# Patient Record
Sex: Female | Born: 1945 | Race: Black or African American | Hispanic: No | Smoking: Never smoker
Health system: Southern US, Community
[De-identification: ages and names within clinical notes are randomized; demographics above are authoritative.]

## PROBLEM LIST (undated history)

## (undated) DIAGNOSIS — R160 Hepatomegaly, not elsewhere classified: Secondary | ICD-10-CM

## (undated) DIAGNOSIS — F32A Depression, unspecified: Secondary | ICD-10-CM

## (undated) DIAGNOSIS — M4800 Spinal stenosis, site unspecified: Secondary | ICD-10-CM

## (undated) DIAGNOSIS — R569 Unspecified convulsions: Secondary | ICD-10-CM

## (undated) DIAGNOSIS — C787 Secondary malignant neoplasm of liver and intrahepatic bile duct: Secondary | ICD-10-CM

## (undated) DIAGNOSIS — F419 Anxiety disorder, unspecified: Secondary | ICD-10-CM

## (undated) DIAGNOSIS — I10 Essential (primary) hypertension: Secondary | ICD-10-CM

## (undated) DIAGNOSIS — E785 Hyperlipidemia, unspecified: Secondary | ICD-10-CM

## (undated) DIAGNOSIS — M1990 Unspecified osteoarthritis, unspecified site: Secondary | ICD-10-CM

## (undated) DIAGNOSIS — C189 Malignant neoplasm of colon, unspecified: Secondary | ICD-10-CM

## (undated) DIAGNOSIS — I340 Nonrheumatic mitral (valve) insufficiency: Secondary | ICD-10-CM

## (undated) DIAGNOSIS — Z95828 Presence of other vascular implants and grafts: Secondary | ICD-10-CM

## (undated) DIAGNOSIS — F329 Major depressive disorder, single episode, unspecified: Secondary | ICD-10-CM

## (undated) DIAGNOSIS — IMO0002 Reserved for concepts with insufficient information to code with codable children: Secondary | ICD-10-CM

## (undated) HISTORY — PX: ABDOMINAL HYSTERECTOMY: SHX81

## (undated) HISTORY — DX: Nonrheumatic mitral (valve) insufficiency: I34.0

## (undated) HISTORY — DX: Anxiety disorder, unspecified: F41.9

## (undated) HISTORY — DX: Depression, unspecified: F32.A

## (undated) HISTORY — DX: Hepatomegaly, not elsewhere classified: R16.0

## (undated) HISTORY — DX: Essential (primary) hypertension: I10

## (undated) HISTORY — PX: BREAST BIOPSY: SHX20

## (undated) HISTORY — PX: CARPAL TUNNEL RELEASE: SHX101

## (undated) HISTORY — DX: Reserved for concepts with insufficient information to code with codable children: IMO0002

## (undated) HISTORY — DX: Hyperlipidemia, unspecified: E78.5

## (undated) HISTORY — DX: Presence of other vascular implants and grafts: Z95.828

## (undated) HISTORY — DX: Unspecified convulsions: R56.9

## (undated) HISTORY — PX: BACK SURGERY: SHX140

## (undated) HISTORY — DX: Unspecified osteoarthritis, unspecified site: M19.90

## (undated) HISTORY — DX: Major depressive disorder, single episode, unspecified: F32.9

## (undated) HISTORY — PX: EYE SURGERY: SHX253

---

## 2008-06-01 ENCOUNTER — Emergency Department (HOSPITAL_COMMUNITY): Admission: RE | Admit: 2008-06-01 | Discharge: 2008-06-01 | Payer: Self-pay | Admitting: Emergency Medicine

## 2008-06-01 IMAGING — CR DG CHEST 1V PORT
1 series · 1 of 1 positions shown · non-contrast
Comparison: None

CLINICAL DATA: Chest pain, confusion.

PORTABLE CHEST - 1 VIEW

[view not recorded]
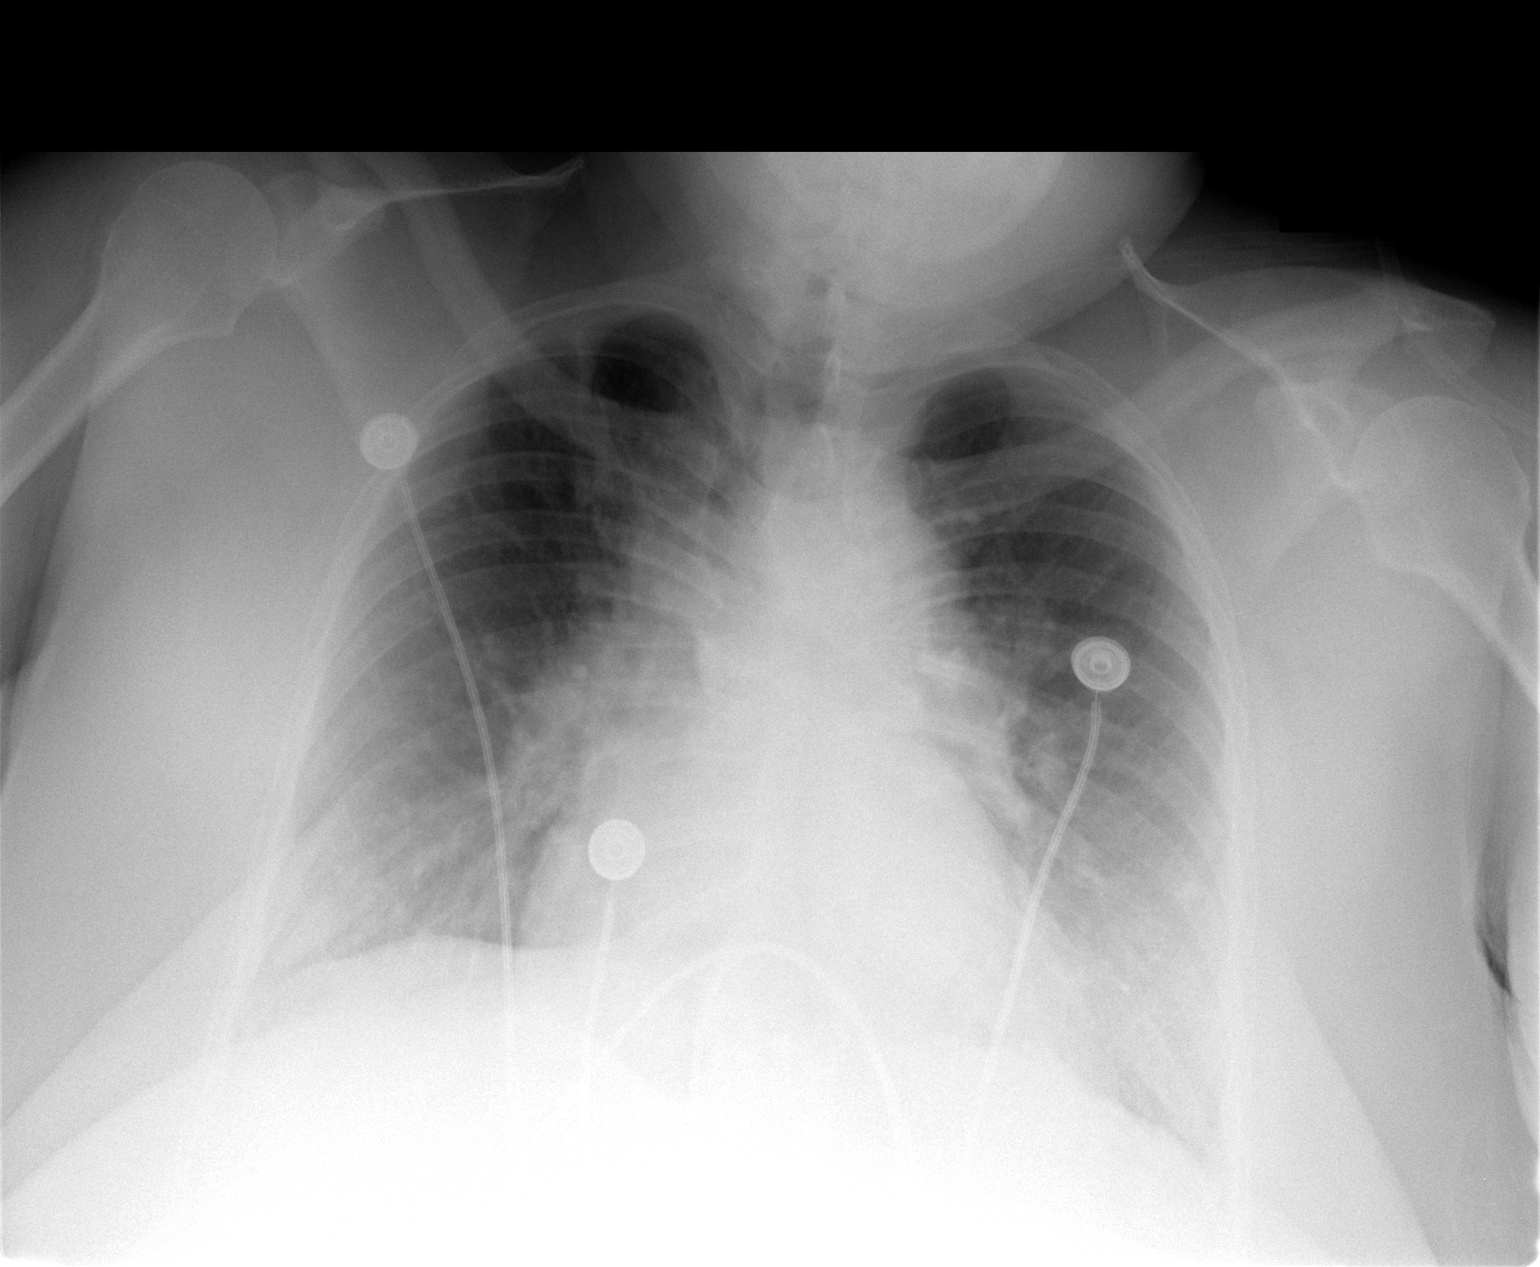

[1 of 1 positions shown; findings below may reference images not displayed]

FINDINGS: There is cardiomegaly with mild vascular congestion.  No
focal airspace opacities or effusions.
IMPRESSION: Cardiomegaly, vascular congestion.

## 2009-10-05 HISTORY — PX: BREAST BIOPSY: SHX20

## 2011-01-01 ENCOUNTER — Emergency Department (HOSPITAL_COMMUNITY)
Admission: EM | Admit: 2011-01-01 | Discharge: 2011-01-01 | Disposition: A | Discharge status: Home / Self Care | Payer: Medicare Other | Attending: Emergency Medicine | Admitting: Emergency Medicine

## 2011-01-01 DIAGNOSIS — I10 Essential (primary) hypertension: Secondary | ICD-10-CM | POA: Insufficient documentation

## 2011-01-01 DIAGNOSIS — H00039 Abscess of eyelid unspecified eye, unspecified eyelid: Secondary | ICD-10-CM | POA: Insufficient documentation

## 2011-01-01 DIAGNOSIS — H5789 Other specified disorders of eye and adnexa: Secondary | ICD-10-CM | POA: Insufficient documentation

## 2011-01-01 DIAGNOSIS — R3915 Urgency of urination: Secondary | ICD-10-CM | POA: Insufficient documentation

## 2011-01-05 ENCOUNTER — Emergency Department (HOSPITAL_COMMUNITY)
Admission: EM | Admit: 2011-01-05 | Discharge: 2011-01-05 | Disposition: A | Discharge status: Home / Self Care | Payer: Medicare Other | Attending: Emergency Medicine | Admitting: Emergency Medicine

## 2011-01-05 DIAGNOSIS — H5710 Ocular pain, unspecified eye: Secondary | ICD-10-CM | POA: Insufficient documentation

## 2011-01-05 DIAGNOSIS — I10 Essential (primary) hypertension: Secondary | ICD-10-CM | POA: Insufficient documentation

## 2011-01-05 DIAGNOSIS — H00019 Hordeolum externum unspecified eye, unspecified eyelid: Secondary | ICD-10-CM | POA: Insufficient documentation

## 2011-01-05 LAB — CBC
HCT: 36.2 % (ref 36.0–46.0)
Hemoglobin: 11.1 g/dL — ABNORMAL LOW (ref 12.0–15.0)
MCH: 27.5 pg (ref 26.0–34.0)
MCHC: 30.7 g/dL (ref 30.0–36.0)
MCV: 89.6 fL (ref 78.0–100.0)
Platelets: 217 10*3/uL (ref 150–400)
RBC: 4.04 MIL/uL (ref 3.87–5.11)
RDW: 13.9 % (ref 11.5–15.5)
WBC: 5.4 10*3/uL (ref 4.0–10.5)

## 2011-01-05 LAB — DIFFERENTIAL
Basophils Absolute: 0 10*3/uL (ref 0.0–0.1)
Basophils Relative: 1 % (ref 0–1)
Eosinophils Absolute: 0.1 10*3/uL (ref 0.0–0.7)
Eosinophils Relative: 2 % (ref 0–5)
Lymphocytes Relative: 49 % — ABNORMAL HIGH (ref 12–46)
Lymphs Abs: 2.6 10*3/uL (ref 0.7–4.0)
Monocytes Absolute: 0.5 10*3/uL (ref 0.1–1.0)
Monocytes Relative: 10 % (ref 3–12)
Neutro Abs: 2.1 10*3/uL (ref 1.7–7.7)
Neutrophils Relative %: 39 % — ABNORMAL LOW (ref 43–77)

## 2011-01-05 LAB — POCT I-STAT, CHEM 8
BUN: 20 mg/dL (ref 6–23)
Calcium, Ion: 1.15 mmol/L (ref 1.12–1.32)
Chloride: 102 mEq/L (ref 96–112)
Creatinine, Ser: 1.1 mg/dL (ref 0.4–1.2)
Glucose, Bld: 88 mg/dL (ref 70–99)
HCT: 36 % (ref 36.0–46.0)
Hemoglobin: 12.2 g/dL (ref 12.0–15.0)
Potassium: 3.9 mEq/L (ref 3.5–5.1)
Sodium: 140 mEq/L (ref 135–145)
TCO2: 28 mmol/L (ref 0–100)

## 2011-01-15 ENCOUNTER — Emergency Department (HOSPITAL_COMMUNITY)
Admission: EM | Admit: 2011-01-15 | Discharge: 2011-01-15 | Disposition: A | Discharge status: Home / Self Care | Payer: Medicare Other | Attending: Emergency Medicine | Admitting: Emergency Medicine

## 2011-01-15 DIAGNOSIS — M542 Cervicalgia: Secondary | ICD-10-CM | POA: Insufficient documentation

## 2011-01-15 DIAGNOSIS — I10 Essential (primary) hypertension: Secondary | ICD-10-CM | POA: Insufficient documentation

## 2011-01-15 DIAGNOSIS — M25519 Pain in unspecified shoulder: Secondary | ICD-10-CM | POA: Insufficient documentation

## 2011-01-22 ENCOUNTER — Emergency Department (HOSPITAL_COMMUNITY)
Admission: EM | Admit: 2011-01-22 | Discharge: 2011-01-22 | Disposition: A | Discharge status: Home / Self Care | Payer: Medicare Other | Attending: Emergency Medicine | Admitting: Emergency Medicine

## 2011-01-22 DIAGNOSIS — M4800 Spinal stenosis, site unspecified: Secondary | ICD-10-CM | POA: Insufficient documentation

## 2011-01-22 DIAGNOSIS — I10 Essential (primary) hypertension: Secondary | ICD-10-CM | POA: Insufficient documentation

## 2011-01-22 DIAGNOSIS — G8929 Other chronic pain: Secondary | ICD-10-CM | POA: Insufficient documentation

## 2011-01-22 DIAGNOSIS — M542 Cervicalgia: Secondary | ICD-10-CM | POA: Insufficient documentation

## 2011-02-06 ENCOUNTER — Ambulatory Visit (INDEPENDENT_AMBULATORY_CARE_PROVIDER_SITE_OTHER): Payer: Medicare Other | Admitting: Family Medicine

## 2011-02-06 ENCOUNTER — Encounter: Payer: Self-pay | Admitting: Family Medicine

## 2011-02-06 DIAGNOSIS — M4807 Spinal stenosis, lumbosacral region: Secondary | ICD-10-CM | POA: Insufficient documentation

## 2011-02-06 DIAGNOSIS — Z9889 Other specified postprocedural states: Secondary | ICD-10-CM

## 2011-02-06 DIAGNOSIS — M4800 Spinal stenosis, site unspecified: Secondary | ICD-10-CM

## 2011-02-06 DIAGNOSIS — E785 Hyperlipidemia, unspecified: Secondary | ICD-10-CM

## 2011-02-06 DIAGNOSIS — I10 Essential (primary) hypertension: Secondary | ICD-10-CM | POA: Insufficient documentation

## 2011-02-06 MED ORDER — HYDROCODONE-ACETAMINOPHEN 5-500 MG PO TABS: 1.0000 | ORAL_TABLET | ORAL | Status: DC | PRN

## 2011-02-06 NOTE — Progress Notes (Signed)
  Subjective:    Patient ID: Chelsea [name], female    DOB: 1946-02-14, 65 y.o.   MRN: [id number]  HPI Here today to establish care.  Previous MD- Priscille Kluver [phone], fax [phone])  Breast bx- pt had bx's done on both breasts (R side 6 months ago) and is to have repeat mammo now per pt report (no records available).  Spinal stenosis- has been seen at [facility] by neurology and neurosurgeon.  Was scheduled to see an orthopedic surgeon but this never happened b/c of the recent move from [place].  Having increased pain on R side.  Taking muscle relaxers and pain meds.  Has done PT.  Refusing surgery, injxns.  Hyperlipidemia- chronic problem for pt, taking Simvastatin.  No abd pain, N/V, myalgia  HTN- chronic problem for pt.  Fairly well controlled on Maxzide.  Denies CP, SOB, HAs, visual changes, edema.   Review of Systems For ROS see HPI     Objective:   Physical Exam  Constitutional: She is oriented to person, place, and time. She appears well-developed and well-nourished. No distress.       Very difficult to understand pt's speech  HENT:  Head: Normocephalic and atraumatic.  Eyes: Conjunctivae and EOM are normal. Pupils are equal, round, and reactive to light.  Neck: Neck supple.       Limited ROM due to pain  Cardiovascular: Normal rate, regular rhythm, normal heart sounds and intact distal pulses.   Pulmonary/Chest: Effort normal and breath sounds normal. No respiratory distress. She has no wheezes.  Abdominal: Soft. Bowel sounds are normal. She exhibits no distension. There is no tenderness.  Lymphadenopathy:    She has no cervical adenopathy.  Neurological: She is alert and oriented to person, place, and time. No cranial nerve deficit.  Skin: Skin is warm and dry.          Assessment & Plan:

## 2011-02-06 NOTE — Patient Instructions (Signed)
Follow up in 3 months to recheck cholesterol and blood pressure We'll call you with your mammogram and orthopedic appt Take the Hydrocodone as needed for pain relief Use the muscle relaxers as needed for pain relief Call if you need any refills Call with any questions or concerns Welcome!  We're glad to have you!

## 2011-02-17 NOTE — Assessment & Plan Note (Signed)
Given that pt reports she is to have f/u mammogram at this time will refer to breast center.

## 2011-02-17 NOTE — Assessment & Plan Note (Signed)
BP adequately controlled today.  Asymptomatic.  No changes at this time.

## 2011-02-17 NOTE — Assessment & Plan Note (Signed)
On statin and tolerating this w/out difficulty.  Due for repeat blood work in ~3 months per pt report.  Will return at that time for fasting labs and adjust meds prn.

## 2011-02-17 NOTE — Assessment & Plan Note (Signed)
This is pt's biggest issue currently.  From pt's report it sounds as if the work up has been very extensive.  Pt reports she is not interested in further injxns or surgery.  Open to idea of PT.  Was to see ortho prior to move for them to determine best course of PT- will refer locally.  Will maintain pt on meds at this time.

## 2011-03-05 ENCOUNTER — Telehealth: Payer: Self-pay

## 2011-03-05 ENCOUNTER — Other Ambulatory Visit: Payer: Self-pay | Admitting: *Deleted

## 2011-03-05 MED ORDER — HYDROCODONE-ACETAMINOPHEN 5-500 MG PO TABS: 1.0000 | ORAL_TABLET | ORAL | Status: DC | PRN

## 2011-03-05 NOTE — Telephone Encounter (Signed)
Call from pharmacy and they stated Hydrocodone 5-500 is on back order until August.... Per Dr.Tabori it is ok to change Rx to 7.5-500 1 po q6h prn #60 no refills... Pharmacy voiced understanding    KP

## 2011-03-05 NOTE — Telephone Encounter (Signed)
Faxed.   KP 

## 2011-03-05 NOTE — Telephone Encounter (Signed)
Ok for #60, w/ 1 refill

## 2011-03-19 ENCOUNTER — Ambulatory Visit
Admission: RE | Admit: 2011-03-19 | Discharge: 2011-03-19 | Disposition: A | Discharge status: Home / Self Care | Payer: Medicare Other | Source: Ambulatory Visit | Attending: Family Medicine | Admitting: Family Medicine

## 2011-03-19 DIAGNOSIS — Z9889 Other specified postprocedural states: Secondary | ICD-10-CM

## 2011-03-25 ENCOUNTER — Other Ambulatory Visit: Payer: Self-pay | Admitting: Family Medicine

## 2011-03-25 DIAGNOSIS — Z9889 Other specified postprocedural states: Secondary | ICD-10-CM

## 2011-03-25 DIAGNOSIS — C50919 Malignant neoplasm of unspecified site of unspecified female breast: Secondary | ICD-10-CM

## 2011-03-31 ENCOUNTER — Telehealth: Payer: Self-pay | Admitting: *Deleted

## 2011-03-31 NOTE — Telephone Encounter (Signed)
I called the pt and notified her that we still have not received her records. We have faxed [facility] on 3 separate occasions with no response. Pt notes that she will call them.

## 2011-04-02 ENCOUNTER — Ambulatory Visit
Admission: RE | Admit: 2011-04-02 | Discharge: 2011-04-02 | Disposition: A | Discharge status: Home / Self Care | Payer: Medicare Other | Source: Ambulatory Visit | Attending: Family Medicine | Admitting: Family Medicine

## 2011-04-02 DIAGNOSIS — C50919 Malignant neoplasm of unspecified site of unspecified female breast: Secondary | ICD-10-CM

## 2011-04-02 IMAGING — MG MM DIGITAL DIAGNOSTIC BILAT {BCG}
4 series · 4 of 4 positions shown · non-contrast
Comparison: Outside mammograms from [REDACTED]
[DATE], [DATE], [DATE], [DATE], and

CLINICAL DATA: Due for annual examination.  The patient just moved
to [HOSPITAL] from [place] and brought her prior mammograms,
mammogram reports, and breast biopsy reports.  Prior biopsy reports
were reviewed.  Pathology results for both breast biopsies were
benign.  The patient is asymptomatic.

DIGITAL DIAGNOSTIC BILATERAL MAMMOGRAM WITH CAD

[R CC]
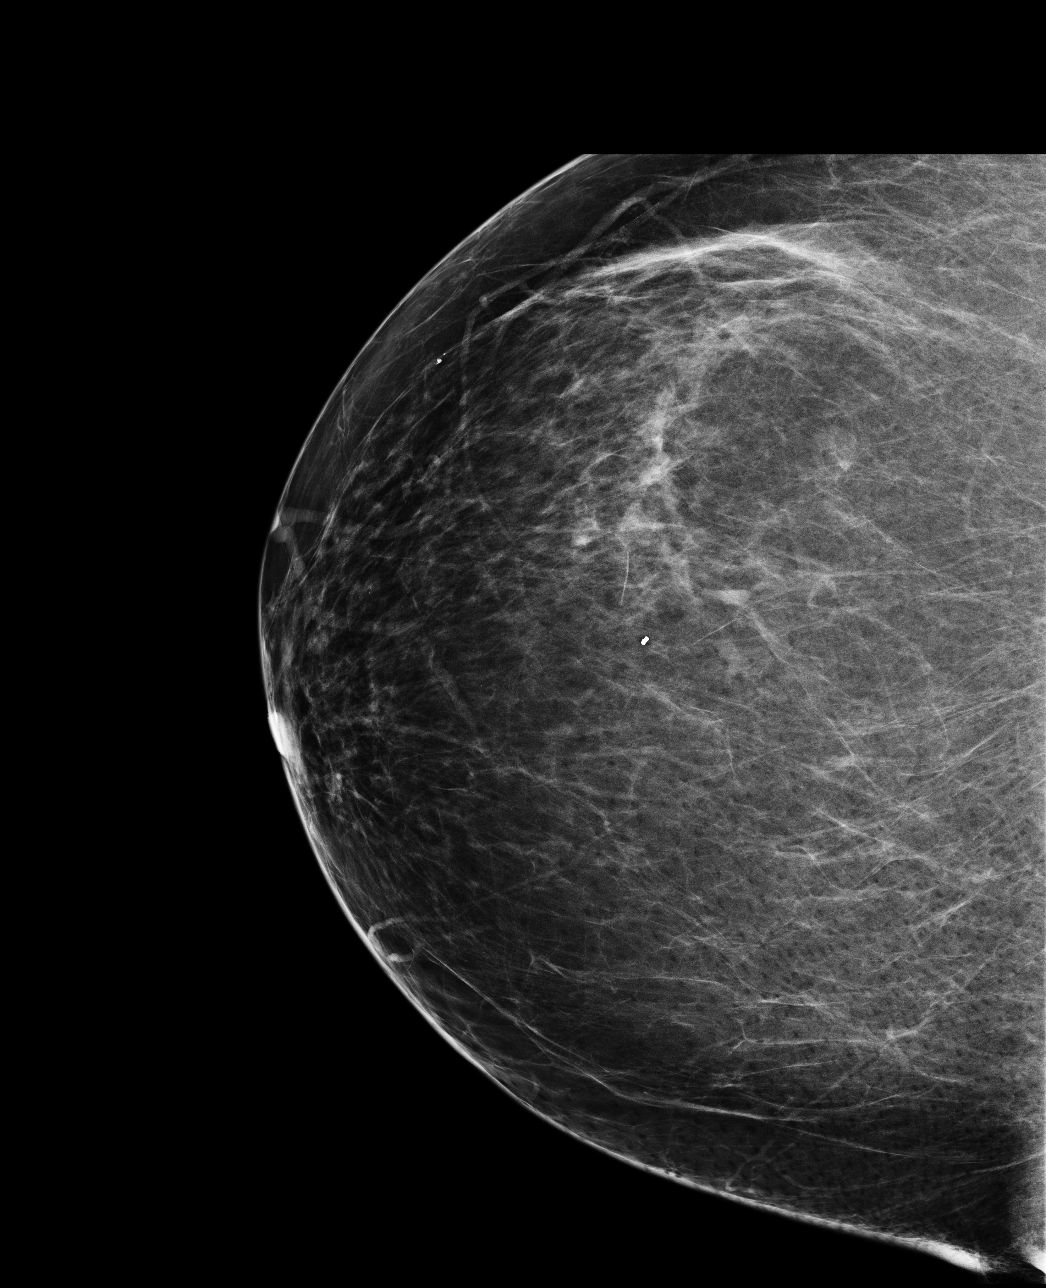

[L CC]
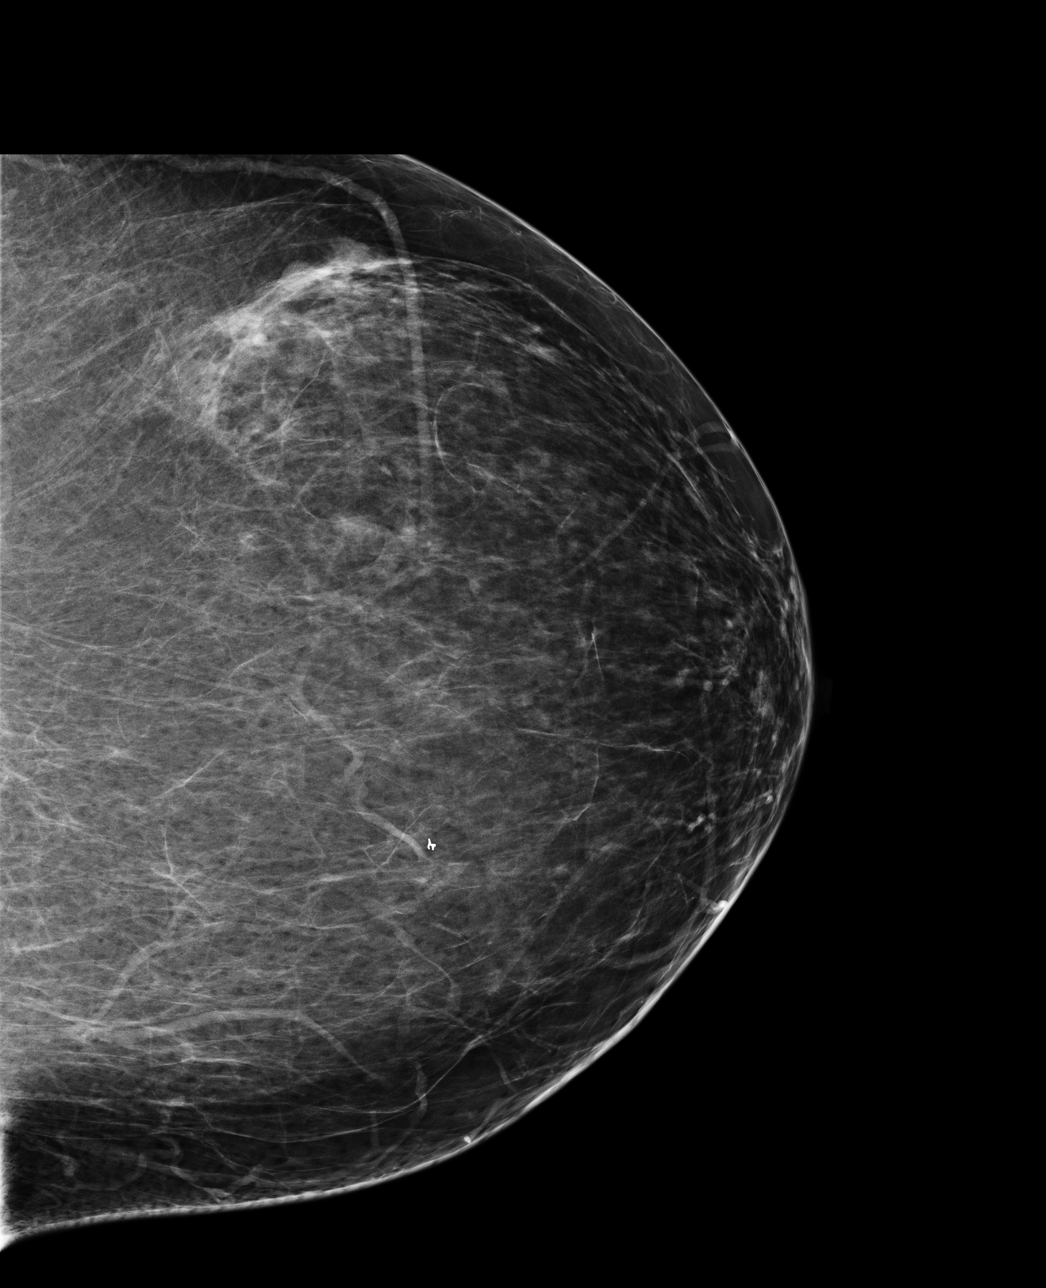

[L MLO]
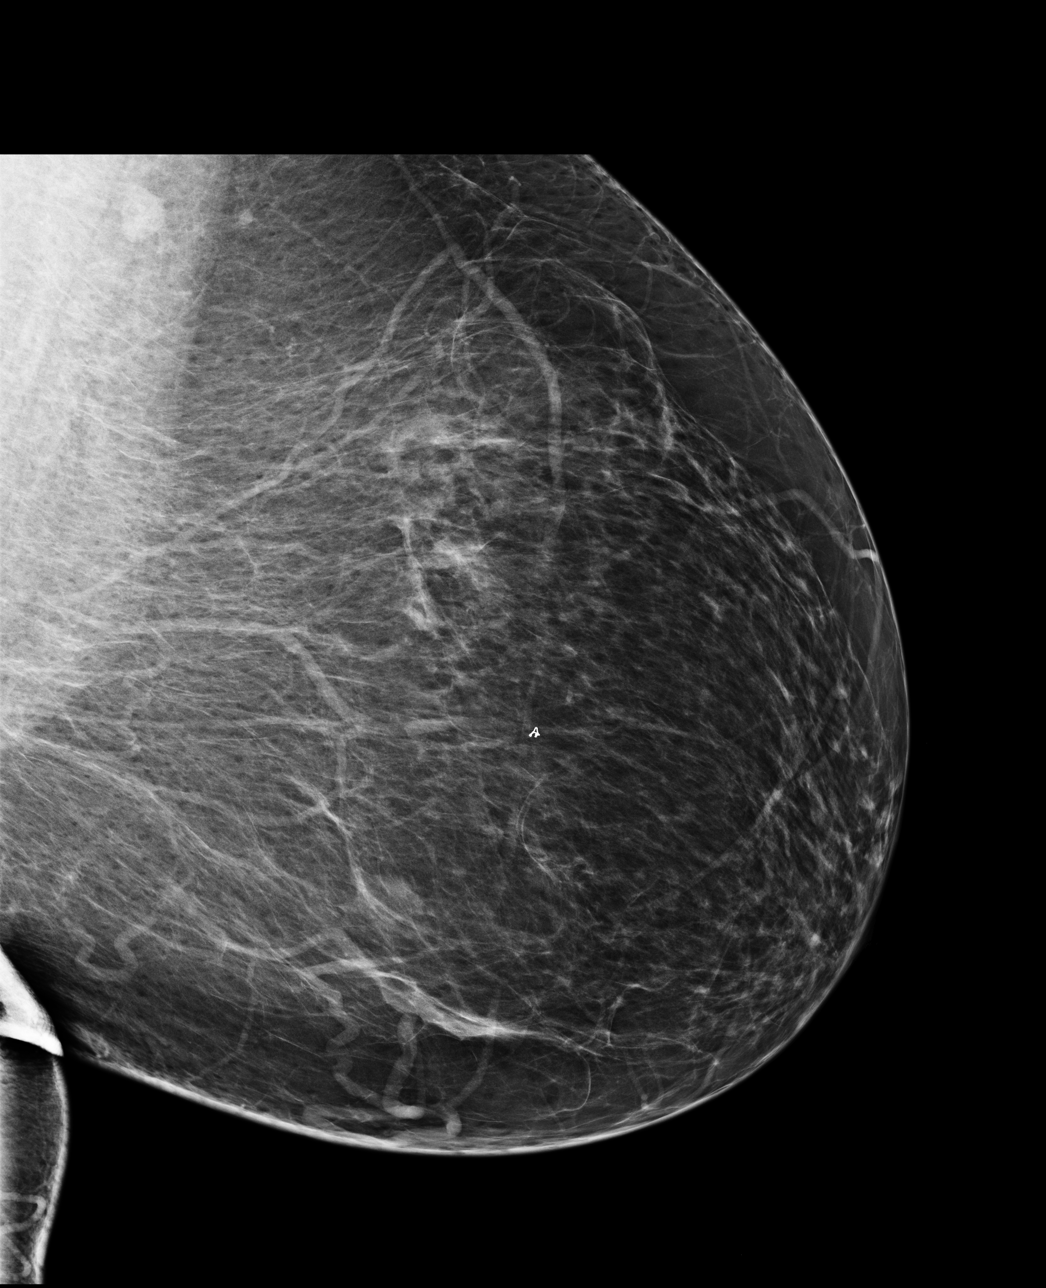

[R MLO]
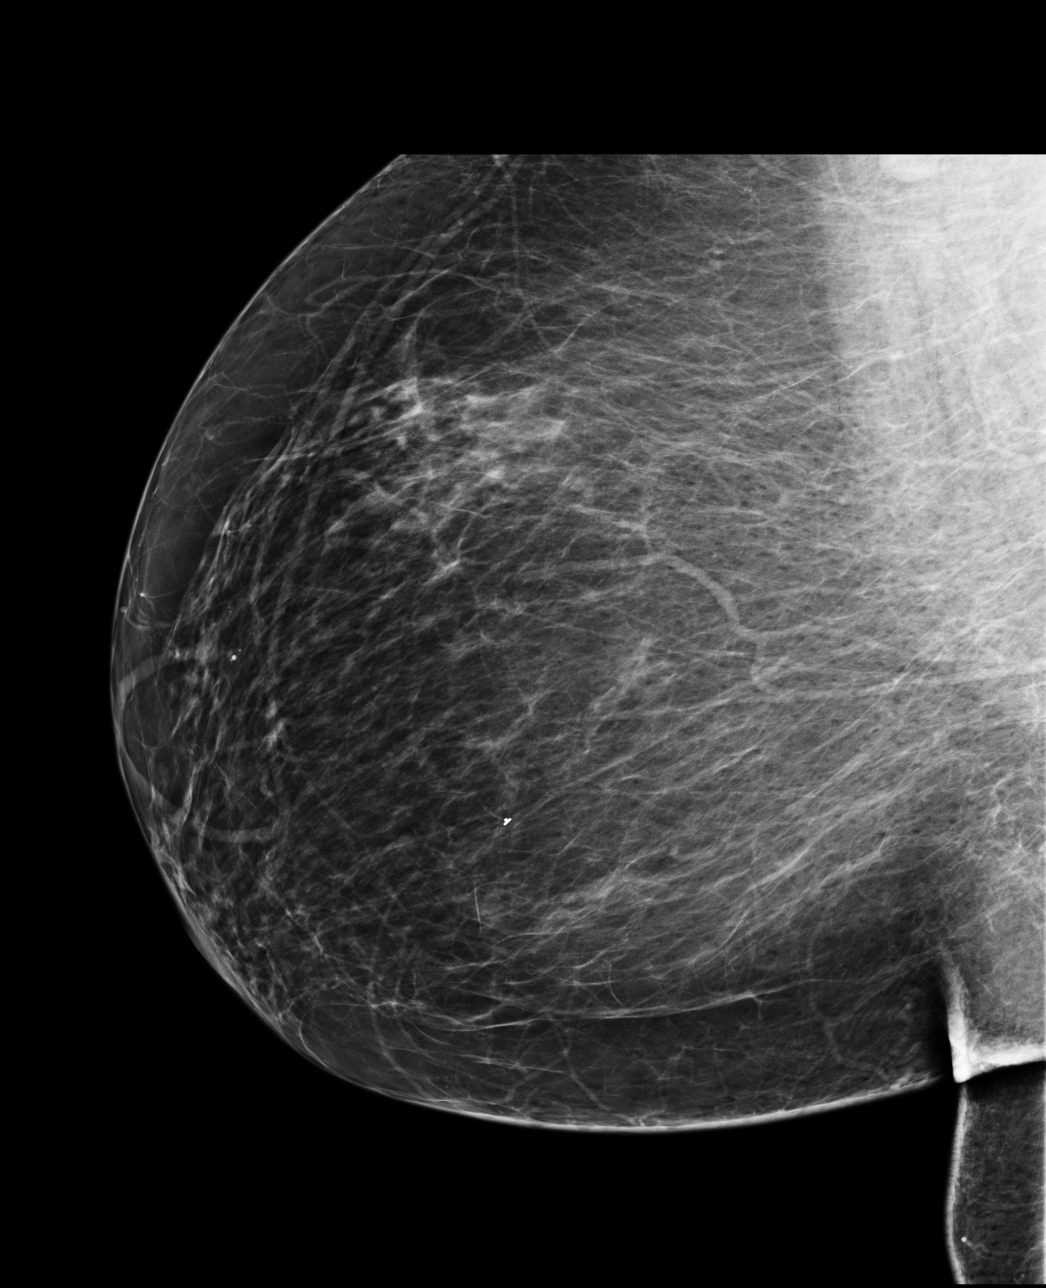

[4 of 4 positions shown; findings below may reference images not displayed]

FINDINGS: There are scattered fibroglandular densities
bilaterally.  Biopsy clip from prior stereotactic biopsy is present
in the central right breast.  Biopsy clip from prior stereotactic
biopsy is seen in the medial left breast.  There are no residual
microcalcifications in the regions of either stereotactic biopsy
clip.  There are no suspicious microcalcifications in either
breast.  No mass or distortion is seen bilaterally.

Mammographic images were processed with CAD.
IMPRESSION: No evidence of malignancy in either breast.  Clips from prior
bilateral stereotactic biopsies.

Bilateral screening mammogram in 1 year is recommended.

BI-RADS CATEGORY 2:  Benign finding(s).

## 2011-04-03 ENCOUNTER — Other Ambulatory Visit: Payer: Self-pay | Admitting: Family Medicine

## 2011-04-03 MED ORDER — HYDROCODONE-ACETAMINOPHEN 5-500 MG PO TABS: 1.0000 | ORAL_TABLET | ORAL | Status: DC | PRN

## 2011-04-03 NOTE — Telephone Encounter (Signed)
Ok for #60 

## 2011-04-03 NOTE — Telephone Encounter (Signed)
Last refilled 5/31. Please advise.

## 2011-04-03 NOTE — Telephone Encounter (Signed)
Refill sent.

## 2011-04-24 ENCOUNTER — Encounter: Payer: Self-pay | Admitting: Family Medicine

## 2011-04-28 ENCOUNTER — Other Ambulatory Visit: Payer: Self-pay | Admitting: Family Medicine

## 2011-04-28 MED ORDER — ALPRAZOLAM 0.5 MG PO TABS: 0.5000 mg | ORAL_TABLET | Freq: Three times a day (TID) | ORAL | Status: DC | PRN

## 2011-04-28 MED ORDER — HYDROCODONE-ACETAMINOPHEN 5-500 MG PO TABS: 1.0000 | ORAL_TABLET | ORAL | Status: DC | PRN

## 2011-04-28 NOTE — Telephone Encounter (Signed)
Ok to refill both?? 

## 2011-04-28 NOTE — Telephone Encounter (Signed)
Hydrocodone was last refilled 04/03/11. No history on Alprazolam. Please advise.

## 2011-04-28 NOTE — Telephone Encounter (Signed)
Refill sent.

## 2011-05-11 ENCOUNTER — Ambulatory Visit: Payer: Medicare Other | Admitting: Family Medicine

## 2011-05-14 ENCOUNTER — Telehealth: Payer: Self-pay

## 2011-05-14 ENCOUNTER — Encounter: Payer: Self-pay | Admitting: Family Medicine

## 2011-05-14 ENCOUNTER — Ambulatory Visit (INDEPENDENT_AMBULATORY_CARE_PROVIDER_SITE_OTHER): Payer: Medicare Other | Admitting: Family Medicine

## 2011-05-14 DIAGNOSIS — E785 Hyperlipidemia, unspecified: Secondary | ICD-10-CM

## 2011-05-14 DIAGNOSIS — F3289 Other specified depressive episodes: Secondary | ICD-10-CM

## 2011-05-14 DIAGNOSIS — I10 Essential (primary) hypertension: Secondary | ICD-10-CM

## 2011-05-14 DIAGNOSIS — F329 Major depressive disorder, single episode, unspecified: Secondary | ICD-10-CM

## 2011-05-14 DIAGNOSIS — F411 Generalized anxiety disorder: Secondary | ICD-10-CM | POA: Insufficient documentation

## 2011-05-14 DIAGNOSIS — F418 Other specified anxiety disorders: Secondary | ICD-10-CM | POA: Insufficient documentation

## 2011-05-14 DIAGNOSIS — F32A Depression, unspecified: Secondary | ICD-10-CM

## 2011-05-14 LAB — BASIC METABOLIC PANEL
BUN: 16 mg/dL (ref 6–23)
CO2: 31 mEq/L (ref 19–32)
Calcium: 9.5 mg/dL (ref 8.4–10.5)
Chloride: 101 mEq/L (ref 96–112)
Creatinine, Ser: 0.9 mg/dL (ref 0.4–1.2)
GFR: 80.57 mL/min (ref >60.00)
Glucose, Bld: 90 mg/dL (ref 70–99)
Potassium: 3.4 mEq/L — ABNORMAL LOW (ref 3.5–5.1)
Sodium: 140 mEq/L (ref 135–145)

## 2011-05-14 LAB — HEPATIC FUNCTION PANEL
ALT: 21 U/L (ref 0–35)
AST: 19 U/L (ref 0–37)
Albumin: 4 g/dL (ref 3.5–5.2)
Alkaline Phosphatase: 59 U/L (ref 39–117)
Bilirubin, Direct: 0 mg/dL (ref 0.0–0.3)
Total Bilirubin: 0.7 mg/dL (ref 0.3–1.2)
Total Protein: 7.8 g/dL (ref 6.0–8.3)

## 2011-05-14 LAB — LIPID PANEL
Cholesterol: 162 mg/dL (ref 0–200)
HDL: 58.6 mg/dL (ref >39.00)
LDL Cholesterol: 89 mg/dL (ref 0–99)
Total CHOL/HDL Ratio: 3
Triglycerides: 73 mg/dL (ref 0.0–149.0)
VLDL: 14.6 mg/dL (ref 0.0–40.0)

## 2011-05-14 MED ORDER — ALPRAZOLAM 0.5 MG PO TABS: 0.5000 mg | ORAL_TABLET | Freq: Three times a day (TID) | ORAL | Status: DC | PRN

## 2011-05-14 MED ORDER — HYDROCODONE-ACETAMINOPHEN 5-500 MG PO TABS: 1.0000 | ORAL_TABLET | ORAL | Status: DC | PRN

## 2011-05-14 MED ORDER — TRIAMTERENE-HCTZ 37.5-25 MG PO TABS: 1.0000 | ORAL_TABLET | Freq: Every day | ORAL | Status: DC

## 2011-05-14 MED ORDER — FLUOXETINE HCL 20 MG PO CAPS: 40.0000 mg | ORAL_CAPSULE | Freq: Every day | ORAL | Status: DC

## 2011-05-14 MED ORDER — SIMVASTATIN 20 MG PO TABS: 20.0000 mg | ORAL_TABLET | Freq: Every day | ORAL | Status: DC

## 2011-05-14 MED ORDER — CYCLOBENZAPRINE HCL 10 MG PO TABS: 10.0000 mg | ORAL_TABLET | Freq: Every day | ORAL | Status: DC

## 2011-05-14 NOTE — Telephone Encounter (Signed)
Labs mailed

## 2011-05-14 NOTE — Telephone Encounter (Signed)
Message copied by Beverely Low on Thu May 14, 2011  4:14 PM ------      Message from: Sheliah Hatch      Created: Thu May 14, 2011  2:39 PM       Labs look good!  Keep up the good work!

## 2011-05-14 NOTE — Progress Notes (Signed)
  Subjective:    Patient ID: Chelsea [name], female    DOB: [birth year], 65 y.o.   MRN: [id number]  HPI HTN- chronic problem for pt, currently on Maxzide.  Excellent control.  Denies CP, SOB, HAs, visual changes, N/V.  Hyperlipidemia- chronic problem for pt, on Zocor.  Denies abdominal pain, N/V, myalgias  Depression- pt's husband has dementia but he won't admit it.  Husband unable to work, lost their home.  Has no family help.  Has been on Prozac 20mg .  Open to idea of increasing this.  'i need to do something'.   Review of Systems For ROS see HPI     Objective:   Physical Exam  Vitals reviewed. Constitutional: She is oriented to person, place, and time. She appears well-developed and well-nourished. No distress.  HENT:  Head: Normocephalic and atraumatic.  Eyes: Conjunctivae and EOM are normal. Pupils are equal, round, and reactive to light.  Neck: Normal range of motion. Neck supple. No thyromegaly present.  Cardiovascular: Normal rate, regular rhythm, normal heart sounds and intact distal pulses.   No murmur heard. Pulmonary/Chest: Effort normal and breath sounds normal. No respiratory distress.  Abdominal: Soft. She exhibits no distension. There is no tenderness.  Musculoskeletal: She exhibits no edema.  Lymphadenopathy:    She has no cervical adenopathy.  Neurological: She is alert and oriented to person, place, and time.  Skin: Skin is warm and dry.  Psychiatric: She has a normal mood and affect. Her behavior is normal.          Assessment & Plan:

## 2011-05-14 NOTE — Patient Instructions (Signed)
Follow up in 1 month to recheck mood Take 2 of the Prozac daily (total of 40mg  daily)- if this makes you too sleepy, go back to 1 pill daily We'll notify you of your lab results Call with any questions or concerns Hang in there!

## 2011-05-17 ENCOUNTER — Encounter: Payer: Self-pay | Admitting: Family Medicine

## 2011-05-17 NOTE — Assessment & Plan Note (Signed)
Increase prozac from 20 to 40mg .  F/u in 1 month to see if sxs have improved.  Encouraged pt to find an outlet for her stress.  Will follow closely.

## 2011-05-17 NOTE — Assessment & Plan Note (Signed)
Due for labs.  Tolerating statin w/out difficulty.  Adjust meds prn.

## 2011-05-17 NOTE — Assessment & Plan Note (Signed)
Excellent control.  Asymptomatic.  Will continue to follow.

## 2011-06-17 ENCOUNTER — Ambulatory Visit: Payer: Medicare Other | Admitting: Family Medicine

## 2011-06-22 ENCOUNTER — Ambulatory Visit (INDEPENDENT_AMBULATORY_CARE_PROVIDER_SITE_OTHER): Payer: Medicare Other | Admitting: Family Medicine

## 2011-06-22 DIAGNOSIS — H1010 Acute atopic conjunctivitis, unspecified eye: Secondary | ICD-10-CM

## 2011-06-22 DIAGNOSIS — F3289 Other specified depressive episodes: Secondary | ICD-10-CM

## 2011-06-22 DIAGNOSIS — F32A Depression, unspecified: Secondary | ICD-10-CM

## 2011-06-22 DIAGNOSIS — K121 Other forms of stomatitis: Secondary | ICD-10-CM

## 2011-06-22 DIAGNOSIS — K1370 Unspecified lesions of oral mucosa: Secondary | ICD-10-CM

## 2011-06-22 DIAGNOSIS — H1045 Other chronic allergic conjunctivitis: Secondary | ICD-10-CM

## 2011-06-22 DIAGNOSIS — F329 Major depressive disorder, single episode, unspecified: Secondary | ICD-10-CM

## 2011-06-22 MED ORDER — FLUOXETINE HCL 40 MG PO CAPS: 40.0000 mg | ORAL_CAPSULE | Freq: Every day | ORAL | Status: DC

## 2011-06-22 NOTE — Patient Instructions (Signed)
Follow up in 6 months to recheck blood pressure and cholesterol We changed your Prozac prescription- it is now 40mg  pills, take 1 tab daily Your red eyes sound like allergies- take Claritin or Zyrtec daily (store brand works just as well) Your mouth sore is b/c you are biting your cheek at night- if it continues, see a dentist Call with any questions or concerns Hang in there!

## 2011-06-22 NOTE — Progress Notes (Signed)
  Subjective:    Patient ID: Chelsea [name], female    DOB: Feb 27, 1946, 65 y.o.   MRN: [id number]  HPI Depression- chronic problem for pt.  At last visit increased Prozac to 40mg .  Pt reports mood has improved, has more tolerance and patience.  Denies fatigue, nausea or other side effects from meds.  Red eyes- reports this is happening every morning when she wakes up.  Redness improves throughout the day.  Canker sore- has lesion on R inner cheek and level of bite line.  Using Orajel w/ good relief.   Review of Systems For ROS see HPI     Objective:   Physical Exam  Vitals reviewed. Constitutional: She appears well-developed and well-nourished. No distress.  HENT:  Head: Normocephalic and atraumatic.  Mouth/Throat: Oropharynx is clear and moist.       Small apthous ulcers along R inner cheek lining at bite line  Eyes: Conjunctivae and EOM are normal. Pupils are equal, round, and reactive to light. Right eye exhibits no discharge. Left eye exhibits no discharge.       No redness noted  Psychiatric: She has a normal mood and affect. Her behavior is normal. Judgment and thought content normal.          Assessment & Plan:

## 2011-06-23 ENCOUNTER — Encounter: Payer: Self-pay | Admitting: Family Medicine

## 2011-06-23 DIAGNOSIS — H1010 Acute atopic conjunctivitis, unspecified eye: Secondary | ICD-10-CM | POA: Insufficient documentation

## 2011-06-23 DIAGNOSIS — K121 Other forms of stomatitis: Secondary | ICD-10-CM | POA: Insufficient documentation

## 2011-06-23 NOTE — Assessment & Plan Note (Signed)
Pt reports sxs are much improved since increasing dose of SSRI.  Continue current dose of med.

## 2011-06-23 NOTE — Assessment & Plan Note (Signed)
Pt's oral ulcers are consistent maceration along bite line.  Continue orajel- encouraged pt to see dentist for possible bite plate/guard.  Pt expressed understanding and is in agreement w/ plan.

## 2011-06-23 NOTE — Assessment & Plan Note (Signed)
No evidence of red eye on exam but based on hx, pt's sxs sound consistent w/ eye allergies.  Start OTC antihistamine to control sxs.  Pt expressed understanding and is in agreement w/ plan.

## 2011-09-09 ENCOUNTER — Other Ambulatory Visit: Payer: Self-pay | Admitting: Family Medicine

## 2011-09-09 NOTE — Telephone Encounter (Signed)
Last OV 06-22-11 last refill on both medications 05-14-11

## 2011-09-10 MED ORDER — ALPRAZOLAM 0.5 MG PO TABS: 0.5000 mg | ORAL_TABLET | Freq: Three times a day (TID) | ORAL | Status: DC | PRN

## 2011-09-10 MED ORDER — HYDROCODONE-ACETAMINOPHEN 5-500 MG PO TABS: 1.0000 | ORAL_TABLET | ORAL | Status: DC | PRN

## 2011-09-10 NOTE — Telephone Encounter (Signed)
Ok for both

## 2011-09-10 NOTE — Telephone Encounter (Signed)
Manually faxed both rx to pharmacy noted in chart

## 2011-09-18 ENCOUNTER — Ambulatory Visit (INDEPENDENT_AMBULATORY_CARE_PROVIDER_SITE_OTHER): Payer: Medicare Other | Admitting: Family Medicine

## 2011-09-18 ENCOUNTER — Encounter: Payer: Self-pay | Admitting: Family Medicine

## 2011-09-18 VITALS — BP 100/60 | HR 84 | Temp 97.8°F | Ht 59.25 in | Wt 183.2 lb

## 2011-09-18 DIAGNOSIS — F329 Major depressive disorder, single episode, unspecified: Secondary | ICD-10-CM

## 2011-09-18 DIAGNOSIS — F32A Depression, unspecified: Secondary | ICD-10-CM

## 2011-09-18 DIAGNOSIS — F3289 Other specified depressive episodes: Secondary | ICD-10-CM
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
## 2011-10-02 ENCOUNTER — Other Ambulatory Visit: Payer: Self-pay | Admitting: Family Medicine

## 2011-10-02 MED ORDER — HYDROCODONE-ACETAMINOPHEN 5-500 MG PO TABS: 1.0000 | ORAL_TABLET | ORAL | Status: DC | PRN

## 2011-10-02 NOTE — Telephone Encounter (Signed)
Ok for #60, no refills 

## 2011-10-02 NOTE — Telephone Encounter (Signed)
Last OV 09-18-11 last refill 09-10-11 #60 no refills, noted early for rx

## 2011-10-02 NOTE — Telephone Encounter (Signed)
.  rx faxed to pharmacy, manually.  

## 2011-10-05 ENCOUNTER — Telehealth: Payer: Self-pay | Admitting: Family Medicine

## 2011-10-05 MED ORDER — ALPRAZOLAM 0.5 MG PO TABS: 0.5000 mg | ORAL_TABLET | Freq: Three times a day (TID) | ORAL | Status: DC | PRN

## 2011-10-05 NOTE — Telephone Encounter (Signed)
[facility] 0.5mg  #90 no refills take TID/PRN gave verbal rx to scott at [facility] per verbal order from MD Beverely Low

## 2011-10-06 NOTE — Telephone Encounter (Signed)
Agree 

## 2011-11-02 ENCOUNTER — Other Ambulatory Visit: Payer: Self-pay | Admitting: Family Medicine

## 2011-11-02 MED ORDER — ALPRAZOLAM 0.5 MG PO TABS: 0.5000 mg | ORAL_TABLET | Freq: Three times a day (TID) | ORAL | Status: DC | PRN

## 2011-11-02 MED ORDER — HYDROCODONE-ACETAMINOPHEN 5-500 MG PO TABS: 1.0000 | ORAL_TABLET | ORAL | Status: DC | PRN

## 2011-11-02 NOTE — Telephone Encounter (Signed)
Noted the correct medication request was for xanax and hyrdrocodone, not for klonopin, spoke with MD Tabori and noted verbal order to send #90 with no refills for xanax and #60 with no refills for hydrocodone .rx faxed to pharmacy, manually. For both RX

## 2011-11-02 NOTE — Telephone Encounter (Signed)
Last OV 09-18-11 last refill for clon. 10-05-11 #90 no refills, hydro. 10-02-11 #60 no refills

## 2011-11-02 NOTE — Telephone Encounter (Signed)
Ok for both- Klonopin #90, hydrocodone #60

## 2011-11-05 ENCOUNTER — Telehealth: Payer: Self-pay | Admitting: *Deleted

## 2011-11-05 MED ORDER — ALPRAZOLAM 0.5 MG PO TABS: 0.5000 mg | ORAL_TABLET | Freq: Three times a day (TID) | ORAL | Status: DC | PRN

## 2011-11-05 NOTE — Telephone Encounter (Signed)
Pt called to advise that she had to switch to [facility] [address] and [facility] and pt needed to contact MD, noted on 11-02-11 for alprazolam 0.5mg  TID/PRN #90 no refills was sent, contacted Joe at [facility] pharmacy and gave verbal order,  Pt aware the rx has been called in. MD aware

## 2011-11-16 ENCOUNTER — Encounter: Payer: Self-pay | Admitting: Family Medicine

## 2011-11-16 ENCOUNTER — Ambulatory Visit (INDEPENDENT_AMBULATORY_CARE_PROVIDER_SITE_OTHER): Payer: Medicare Other | Admitting: Family Medicine

## 2011-11-16 ENCOUNTER — Encounter: Payer: Self-pay | Admitting: *Deleted

## 2011-11-16 DIAGNOSIS — F32A Depression, unspecified: Secondary | ICD-10-CM

## 2011-11-16 DIAGNOSIS — I10 Essential (primary) hypertension: Secondary | ICD-10-CM

## 2011-11-16 DIAGNOSIS — E785 Hyperlipidemia, unspecified: Secondary | ICD-10-CM

## 2011-11-16 DIAGNOSIS — F3289 Other specified depressive episodes: Secondary | ICD-10-CM

## 2011-11-16 DIAGNOSIS — F329 Major depressive disorder, single episode, unspecified: Secondary | ICD-10-CM

## 2011-11-16 LAB — BASIC METABOLIC PANEL
BUN: 17 mg/dL (ref 6–23)
CO2: 31 mEq/L (ref 19–32)
Calcium: 9 mg/dL (ref 8.4–10.5)
Chloride: 102 mEq/L (ref 96–112)
Creatinine, Ser: 1 mg/dL (ref 0.4–1.2)
GFR: 75.58 mL/min (ref >60.00)
Glucose, Bld: 82 mg/dL (ref 70–99)
Potassium: 3.5 mEq/L (ref 3.5–5.1)
Sodium: 138 mEq/L (ref 135–145)

## 2011-11-16 LAB — LIPID PANEL
Cholesterol: 175 mg/dL (ref 0–200)
HDL: 56.6 mg/dL (ref >39.00)
LDL Cholesterol: 106 mg/dL — ABNORMAL HIGH (ref 0–99)
Total CHOL/HDL Ratio: 3
Triglycerides: 61 mg/dL (ref 0.0–149.0)
VLDL: 12.2 mg/dL (ref 0.0–40.0)

## 2011-11-16 LAB — HEPATIC FUNCTION PANEL
ALT: 15 U/L (ref 0–35)
AST: 16 U/L (ref 0–37)
Albumin: 3.7 g/dL (ref 3.5–5.2)
Alkaline Phosphatase: 55 U/L (ref 39–117)
Bilirubin, Direct: 0 mg/dL (ref 0.0–0.3)
Total Bilirubin: 0.5 mg/dL (ref 0.3–1.2)
Total Protein: 7.5 g/dL (ref 6.0–8.3)

## 2011-11-16 NOTE — Patient Instructions (Signed)
Schedule your complete physical in 6 months We'll notify you of your lab results and make any changes if needed Take non-drowsy dramamine for your cruise Call with any questions or concerns Have a great trip!!!
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
## 2011-11-17 NOTE — Assessment & Plan Note (Signed)
Chronic problem.  Tolerating statin w/out difficulty.  Check labs.  Adjust meds prn  

## 2011-11-17 NOTE — Assessment & Plan Note (Signed)
Chronic problem.  Well controlled.  Asymptomatic.  No changes. 

## 2011-11-17 NOTE — Assessment & Plan Note (Signed)
Stable.  sxs controlled on prozac and benzos prn.  Able to enjoy activities and look forward to things.  Will continue to follow.

## 2011-12-02 ENCOUNTER — Telehealth: Payer: Self-pay | Admitting: Family Medicine

## 2011-12-02 NOTE — Telephone Encounter (Signed)
Refill: Xanax .5mg  tab. Take 1 tablet by mouth three times daily as needed. Qty 90. Last fill 11-05-11  Refill: Hydroco/acetaminop 5-500mg  tab. Take 1 tablet by mouth every 4 hours as needed. Qty 60. Last fill 11-05-11

## 2011-12-03 MED ORDER — HYDROCODONE-ACETAMINOPHEN 5-500 MG PO TABS: 1.0000 | ORAL_TABLET | ORAL | Status: DC | PRN

## 2011-12-03 MED ORDER — ALPRAZOLAM 0.5 MG PO TABS: 0.5000 mg | ORAL_TABLET | Freq: Three times a day (TID) | ORAL | Status: DC | PRN

## 2011-12-03 NOTE — Telephone Encounter (Signed)
Last OV 11-16-11 last refill on xanax 11-02-11 #90 no refills TID, hydroco #60 no refills Q4hrs

## 2011-12-03 NOTE — Telephone Encounter (Signed)
Xanax- #90, 1 refill Vicodin- #60, no refills

## 2011-12-03 NOTE — Telephone Encounter (Signed)
.  rx faxed to pharmacy, manually for vicodin and xanax

## 2011-12-30 ENCOUNTER — Other Ambulatory Visit: Payer: Self-pay | Admitting: Family Medicine

## 2011-12-30 ENCOUNTER — Telehealth: Payer: Self-pay | Admitting: Family Medicine

## 2011-12-30 NOTE — Telephone Encounter (Signed)
New pharmacy for this patients medication  VICODIN 5-500mg  tAB  QTY 60 TAKE 1-TABLET BY MOUTH EVERY 4-HOURS AS NEEDED Last fill date 2.28.13

## 2011-12-30 NOTE — Telephone Encounter (Signed)
Refills for  Triam/hctz 37.5-25 tab Qty 30 Take 1-tablet by mouth every day  Last fill 2.27  Fluoxetine 20mg  cap  Qty 60 Take 2-capsules by mouth every day  Last fill 2.27  Simvastatin 20mg  tab  Qty 30  Take 1-tablet by mouth every day  Last fill 2.27

## 2011-12-31 MED ORDER — FLUOXETINE HCL 40 MG PO CAPS: 40.0000 mg | ORAL_CAPSULE | Freq: Every day | ORAL | Status: DC

## 2011-12-31 MED ORDER — TRIAMTERENE-HCTZ 37.5-25 MG PO TABS: 1.0000 | ORAL_TABLET | Freq: Every day | ORAL | Status: DC

## 2011-12-31 MED ORDER — HYDROCODONE-ACETAMINOPHEN 5-500 MG PO TABS: 1.0000 | ORAL_TABLET | ORAL | Status: DC | PRN

## 2011-12-31 MED ORDER — SIMVASTATIN 20 MG PO TABS: 20.0000 mg | ORAL_TABLET | Freq: Every day | ORAL | Status: DC

## 2011-12-31 NOTE — Telephone Encounter (Signed)
Ok for #60, 1 refill 

## 2011-12-31 NOTE — Telephone Encounter (Signed)
.  rx faxed to pharmacy, manually.  

## 2011-12-31 NOTE — Telephone Encounter (Signed)
rx sent to pharmacy by e-script  

## 2011-12-31 NOTE — Telephone Encounter (Signed)
Last OV 11-16-11 last refill 12-03-11 #60 no refills

## 2012-01-04 ENCOUNTER — Telehealth: Payer: Self-pay | Admitting: Family Medicine

## 2012-01-04 ENCOUNTER — Other Ambulatory Visit: Payer: Self-pay

## 2012-01-04 NOTE — Telephone Encounter (Signed)
Please call pharmacy and advise that we are ok w/ early fill b/c pt is taking them appropriately.  Will likely need to increase quantity to get pt through each month

## 2012-01-04 NOTE — Telephone Encounter (Signed)
[facility] Triage Call Report Triage Record Num: [id number] Operator: Durward Mallard DiMatteis Patient Name: Chelsea [name] Call Date & Time: 01/04/2012 10:24:04AM Patient Phone: [phone] PCP: Neena Rhymes ([facility]) Patient Gender: Female PCP Fax : Patient DOB: 06-Oct-1945 Practice Name: [facility] Reason for Call: Caller: Jovi/Patient; PCP: Sheliah Hatch.; CB#: [phone]; Call regarding Medication Question; pt states that she is out of her Xanax and pharmacy will not fill until 01/08/12; she takes Xanax 0.5mg  1 tablets three times per day; [facility] [phone] will not fill this d/t it being too soon; pt states that Dr Beverely Low knows her hx and knows that she has to have this medicine; she states that Dr Beverely Low stated that if she has a problem to call her and she is wanting Dr Beverely Low to help her get some more medicine; Triaged per Medication Questions-Adult Guideline; Call Provider Immed d/t unable to obtain Rx and situation poses immed risk; OFFICE PLEASE F/U WITH PT; instructed pt that message sent to Dr Beverely Low and she will receive a call back Protocol(s) Used: Medication Questions - Adult Recommended Outcome per Protocol: Call Dispensing Pharmacy or Provider Immediately Reason for Outcome: Unable to obtain prescribed medication related to available resources AND situation poses immediate clinical risk Care Advice: ~

## 2012-01-04 NOTE — Telephone Encounter (Signed)
Spoke to rep Caryn Bee with [facility] pharmacy and was advised pt picked up RX for xanax 0.5mg  TID/PRN today at 11:42, pt aware per picked up RX

## 2012-01-04 NOTE — Telephone Encounter (Signed)
Msg from patient stating she was out of her Xanax and she needed a refill but Medicare will not pay for it and she would like a return call at [phone].

## 2012-01-04 NOTE — Telephone Encounter (Signed)
Called pharmacy and spoke to rep kevin whom advised that the pt picked up her RX for xanan 0.5mg  TID/prn today at 11;42am, noted pt aware per pick up rx.

## 2012-01-05 NOTE — Telephone Encounter (Signed)
Noted  

## 2012-01-07 ENCOUNTER — Encounter: Payer: Self-pay | Admitting: Family Medicine

## 2012-01-07 ENCOUNTER — Ambulatory Visit (INDEPENDENT_AMBULATORY_CARE_PROVIDER_SITE_OTHER): Payer: Medicare Other | Admitting: Family Medicine

## 2012-01-07 VITALS — BP 125/84 | HR 80 | Temp 97.7°F | Ht 58.75 in | Wt 187.4 lb

## 2012-01-07 DIAGNOSIS — F329 Major depressive disorder, single episode, unspecified: Secondary | ICD-10-CM

## 2012-01-07 DIAGNOSIS — F32A Depression, unspecified: Secondary | ICD-10-CM

## 2012-01-07 DIAGNOSIS — F3289 Other specified depressive episodes: Secondary | ICD-10-CM

## 2012-01-07 NOTE — Patient Instructions (Signed)
Schedule your complete physical for August Call with any questions or concerns Hang in there!!  Take care of yourself!

## 2012-01-07 NOTE — Assessment & Plan Note (Signed)
Stable.  Pt got medication issue worked out w/ [employer].  Will continue to fill meds on schedule.  Pt appreciative.
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
## 2012-02-01 ENCOUNTER — Telehealth: Payer: Self-pay | Admitting: Family Medicine

## 2012-02-01 NOTE — Telephone Encounter (Signed)
Refill: Cyclobenzapr 10mg  tab # 30. Take one tablet by mouth at bedtime. Last fill 12-30-11 Xanax 0.5mg  tab #90. Take one tablet by mouth three times daily as needed.  Last fill 01-04-12

## 2012-02-01 NOTE — Telephone Encounter (Signed)
Ok for Flexeril #30, 3 refills Ok for xanax #90, 1 refill

## 2012-02-01 NOTE — Telephone Encounter (Signed)
Last OV 01-07-12 last refill for Xanax 12-03-11 #90 with 1 refill, flexeril refill noted 06-03-11 #30 with 6 refills

## 2012-02-02 MED ORDER — CYCLOBENZAPRINE HCL 10 MG PO TABS: 10.0000 mg | ORAL_TABLET | Freq: Every day | ORAL | Status: DC

## 2012-02-02 MED ORDER — ALPRAZOLAM 0.5 MG PO TABS: 0.5000 mg | ORAL_TABLET | Freq: Three times a day (TID) | ORAL | Status: DC | PRN

## 2012-02-02 NOTE — Telephone Encounter (Signed)
rx sent to pharmacy by e-script for flex Xanax faxed manually

## 2012-02-02 NOTE — Telephone Encounter (Signed)
Addended by: Derry Lory A on: 02/02/2012 10:03 AM   Modules accepted: Orders

## 2012-03-02 ENCOUNTER — Other Ambulatory Visit: Payer: Self-pay | Admitting: Family Medicine

## 2012-03-02 NOTE — Telephone Encounter (Signed)
Ok for #60, 1 refill 

## 2012-03-02 NOTE — Telephone Encounter (Signed)
refill vicodin 5-500mg  tab Qty 60 Take one tablet by mouth every 4-hours as needed Last fill 4.28.13 Last OV 4.4.13

## 2012-03-02 NOTE — Telephone Encounter (Signed)
Last OV 01-07-12 last refill 12-31-11 #60 with 1 refill

## 2012-03-03 MED ORDER — HYDROCODONE-ACETAMINOPHEN 5-500 MG PO TABS: 1.0000 | ORAL_TABLET | ORAL | Status: DC | PRN

## 2012-03-03 NOTE — Telephone Encounter (Signed)
.  rx faxed to pharmacy, manually.  

## 2012-03-07 ENCOUNTER — Other Ambulatory Visit: Payer: Self-pay | Admitting: Family Medicine

## 2012-03-07 DIAGNOSIS — Z1231 Encounter for screening mammogram for malignant neoplasm of breast: Secondary | ICD-10-CM

## 2012-03-31 ENCOUNTER — Other Ambulatory Visit: Payer: Self-pay | Admitting: Family Medicine

## 2012-03-31 MED ORDER — ALPRAZOLAM 0.5 MG PO TABS: 0.5000 mg | ORAL_TABLET | Freq: Three times a day (TID) | ORAL | Status: DC | PRN

## 2012-03-31 NOTE — Telephone Encounter (Signed)
.  rx faxed to pharmacy, manually.  

## 2012-03-31 NOTE — Telephone Encounter (Signed)
refill Xanax 0.5mg  tab #90, take one tablet by mouth 3 times daily as needed, last fill 5.29.13, last ov 4.4.13

## 2012-03-31 NOTE — Telephone Encounter (Signed)
Ok for #90, 3 refills 

## 2012-04-04 ENCOUNTER — Ambulatory Visit
Admission: RE | Admit: 2012-04-04 | Discharge: 2012-04-04 | Disposition: A | Discharge status: Home / Self Care | Payer: Medicare Other | Source: Ambulatory Visit | Attending: Family Medicine | Admitting: Family Medicine

## 2012-04-04 DIAGNOSIS — Z1231 Encounter for screening mammogram for malignant neoplasm of breast: Secondary | ICD-10-CM

## 2012-04-04 IMAGING — MG MM DIGITAL SCREENING BILAT
5 series · 5 of 5 positions shown · non-contrast
Comparison: Previous exams

CLINICAL DATA: Screening.

DIGITAL SCREENING MAMMOGRAM WITH CAD

[L MLO]
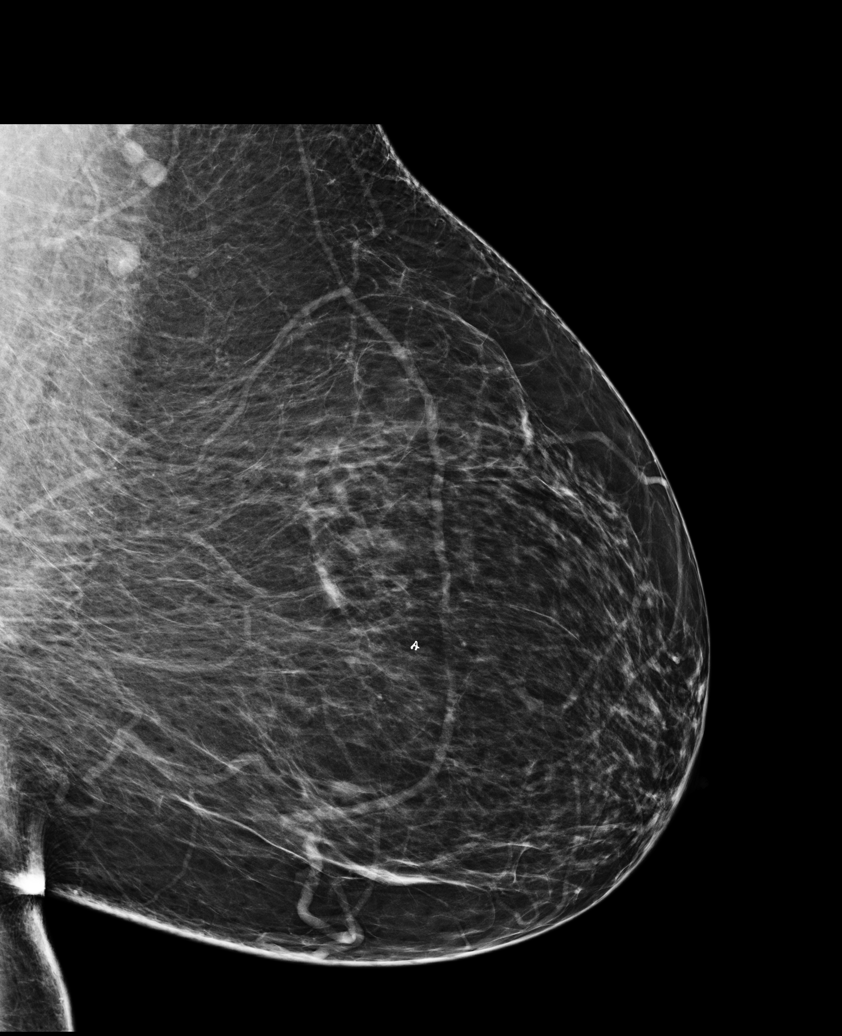

[R CC (1 of 2)]
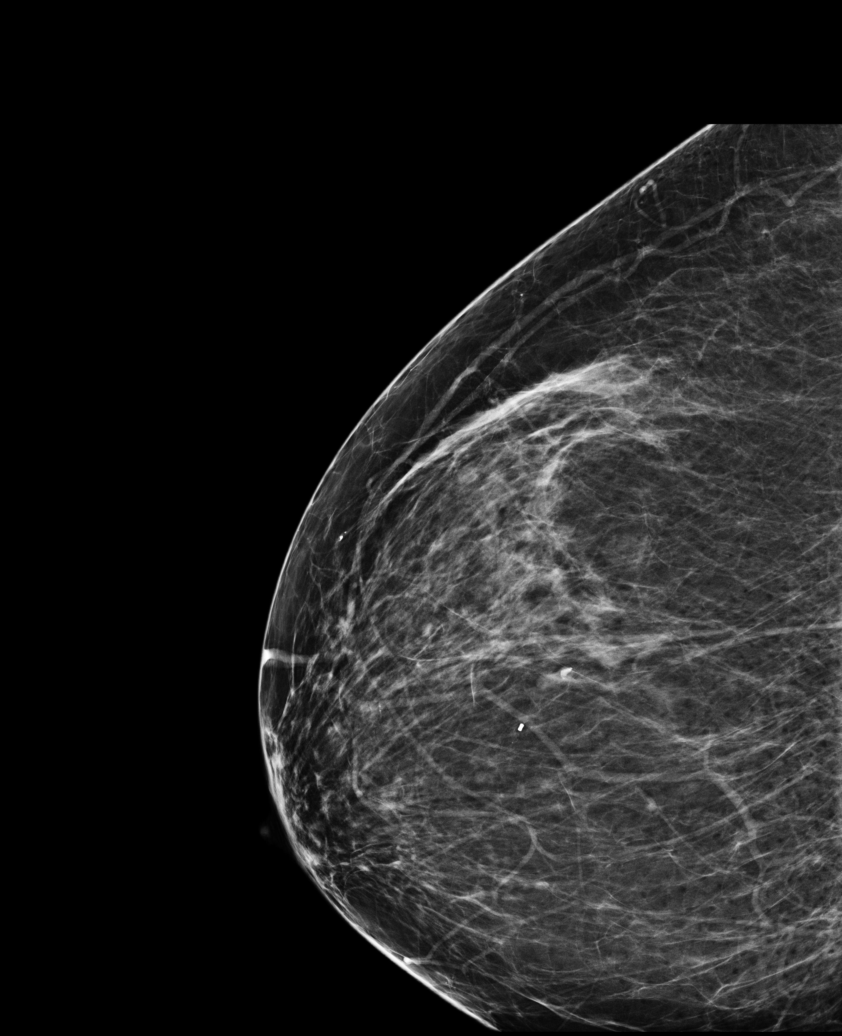

[R MLO]
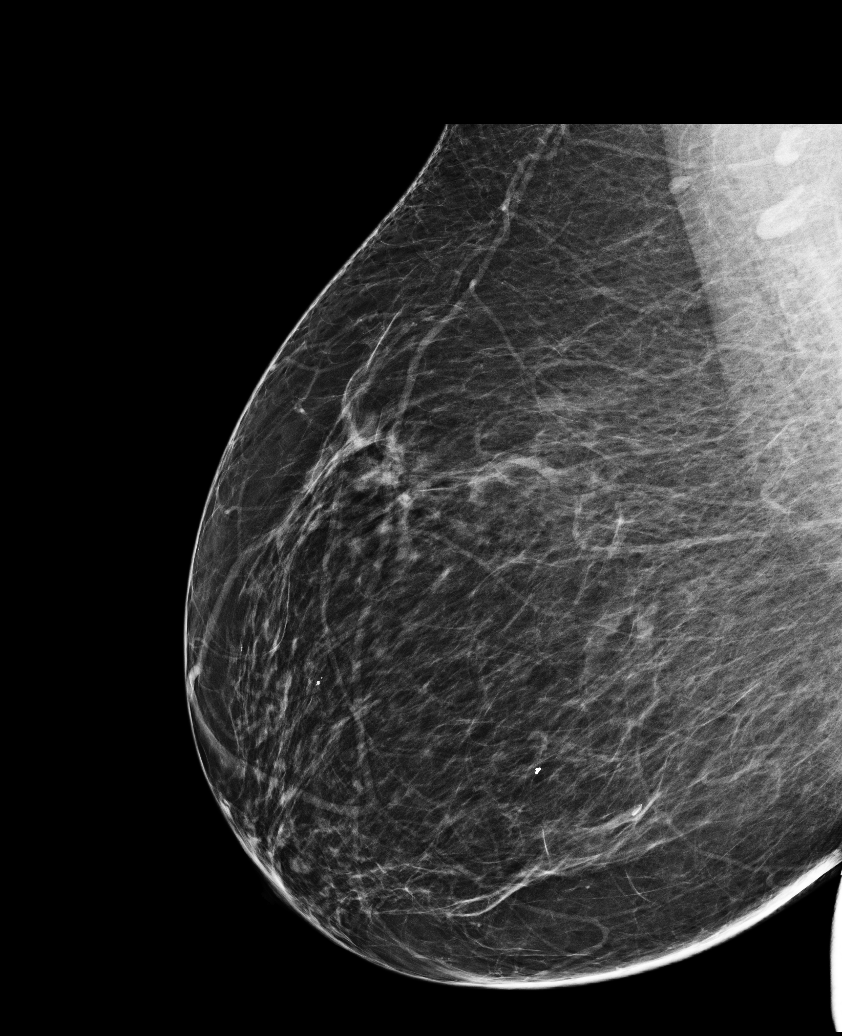

[R CC (2 of 2)]
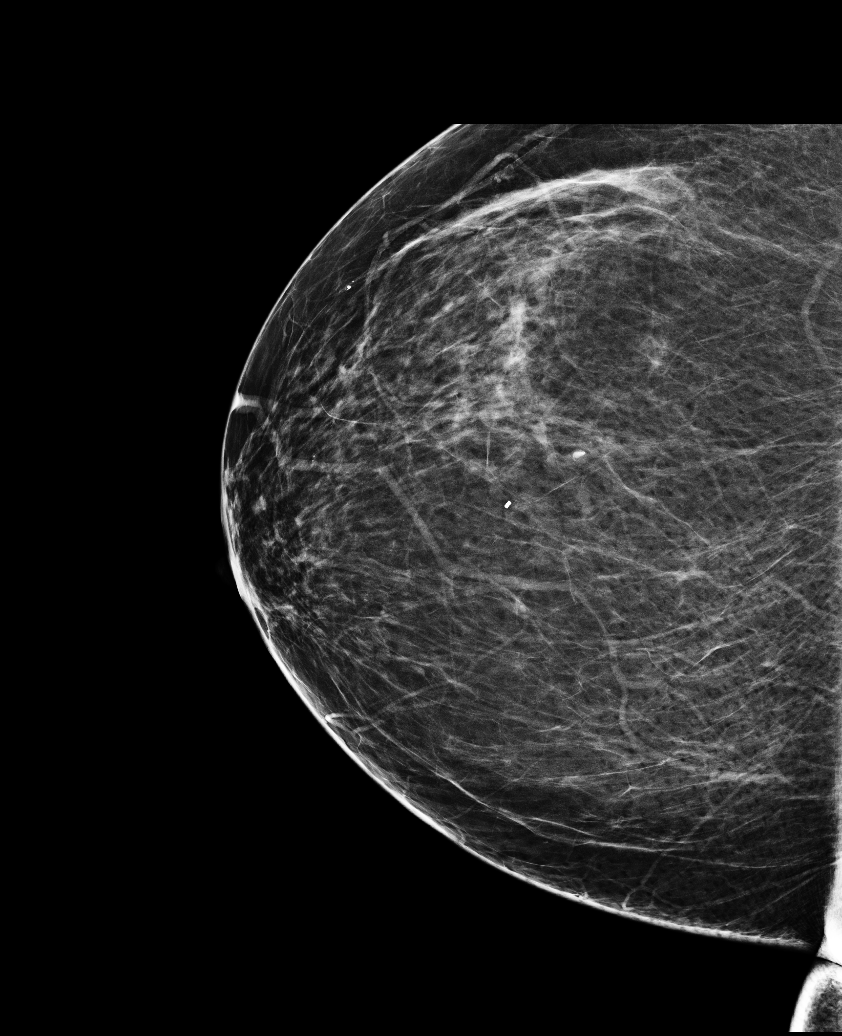

[L CC]
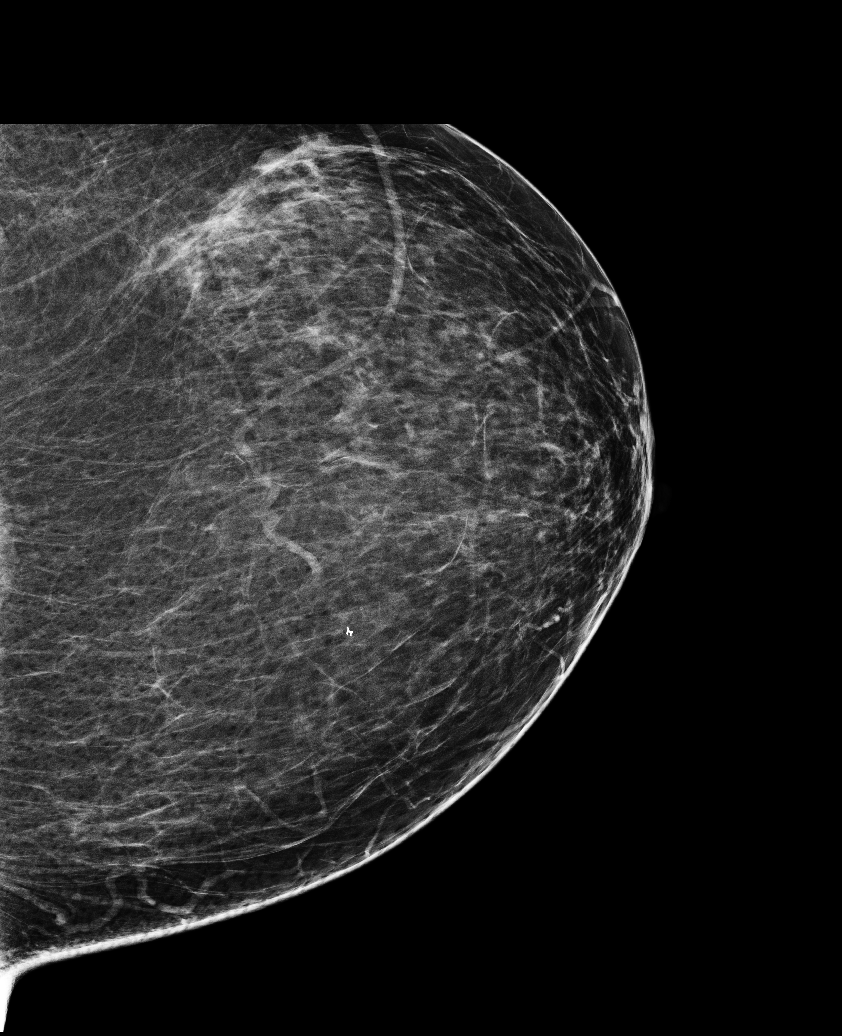

[5 of 5 positions shown; findings below may reference images not displayed]

FINDINGS: There are scattered fibroglandular densities. No
suspicious masses, architectural distortion, or calcifications are
present.

Images were processed with CAD.
IMPRESSION: No specific mammographic evidence of malignancy.

A result letter of this screening mammogram will be mailed directly
to the patient.

RECOMMENDATION:
Screening mammogram in one year. (Code:[QD])

BI-RADS CATEGORY 2:  Benign finding(s).

## 2012-04-05 ENCOUNTER — Encounter (HOSPITAL_COMMUNITY): Payer: Self-pay | Admitting: *Deleted

## 2012-04-05 ENCOUNTER — Emergency Department (HOSPITAL_COMMUNITY)
Admission: EM | Admit: 2012-04-05 | Discharge: 2012-04-05 | Disposition: A | Discharge status: Home / Self Care | Payer: Medicare Other | Attending: Emergency Medicine | Admitting: Emergency Medicine

## 2012-04-05 DIAGNOSIS — M4800 Spinal stenosis, site unspecified: Secondary | ICD-10-CM | POA: Insufficient documentation

## 2012-04-05 DIAGNOSIS — I10 Essential (primary) hypertension: Secondary | ICD-10-CM | POA: Insufficient documentation

## 2012-04-05 DIAGNOSIS — M62838 Other muscle spasm: Secondary | ICD-10-CM | POA: Insufficient documentation

## 2012-04-05 DIAGNOSIS — M542 Cervicalgia: Secondary | ICD-10-CM | POA: Insufficient documentation

## 2012-04-05 DIAGNOSIS — E785 Hyperlipidemia, unspecified: Secondary | ICD-10-CM | POA: Insufficient documentation

## 2012-04-05 HISTORY — DX: Spinal stenosis, site unspecified: M48.00

## 2012-04-05 LAB — BASIC METABOLIC PANEL
BUN: 16 mg/dL (ref 6–23)
CO2: 29 mEq/L (ref 19–32)
Calcium: 9.5 mg/dL (ref 8.4–10.5)
Chloride: 102 mEq/L (ref 96–112)
Creatinine, Ser: 0.86 mg/dL (ref 0.50–1.10)
GFR calc Af Amer: 80 mL/min — ABNORMAL LOW (ref >90)
GFR calc non Af Amer: 69 mL/min — ABNORMAL LOW (ref >90)
Glucose, Bld: 100 mg/dL — ABNORMAL HIGH (ref 70–99)
Potassium: 3.1 mEq/L — ABNORMAL LOW (ref 3.5–5.1)
Sodium: 139 mEq/L (ref 135–145)

## 2012-04-05 LAB — CBC
HCT: 34.9 % — ABNORMAL LOW (ref 36.0–46.0)
Hemoglobin: 11.3 g/dL — ABNORMAL LOW (ref 12.0–15.0)
MCH: 28.6 pg (ref 26.0–34.0)
MCHC: 32.4 g/dL (ref 30.0–36.0)
MCV: 88.4 fL (ref 78.0–100.0)
Platelets: 204 10*3/uL (ref 150–400)
RBC: 3.95 MIL/uL (ref 3.87–5.11)
RDW: 13.9 % (ref 11.5–15.5)
WBC: 4.6 10*3/uL (ref 4.0–10.5)

## 2012-04-05 LAB — TROPONIN I: Troponin I: <0.3 ng/mL (ref <0.30)

## 2012-04-05 MED ORDER — POTASSIUM CHLORIDE 20 MEQ/15ML (10%) PO LIQD
40.0000 meq | Freq: Once | ORAL | Status: AC
  Administered 2012-04-05: 40 meq via ORAL
  Filled 2012-04-05: qty 15
  Filled 2012-04-05: qty 30

## 2012-04-05 MED ORDER — KETOROLAC TROMETHAMINE 15 MG/ML IJ SOLN
15.0000 mg | Freq: Once | INTRAMUSCULAR | Status: AC
  Administered 2012-04-05: 15 mg via INTRAVENOUS
  Filled 2012-04-05: qty 1

## 2012-04-05 MED ORDER — HYDROCODONE-ACETAMINOPHEN 5-325 MG PO TABS
1.0000 | ORAL_TABLET | Freq: Once | ORAL | Status: AC
  Administered 2012-04-05: 1 via ORAL
  Filled 2012-04-05: qty 1

## 2012-04-05 MED ORDER — DIAZEPAM 5 MG/ML IJ SOLN
2.5000 mg | Freq: Once | INTRAMUSCULAR | Status: AC
  Administered 2012-04-05: 2.5 mg via INTRAVENOUS
  Filled 2012-04-05: qty 2

## 2012-04-05 NOTE — ED Notes (Signed)
Pt states she has L sided neck pain that goes from L shoulder to neck.  States she has some SOB, but no distress noted.
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
## 2012-04-05 NOTE — Discharge Instructions (Signed)
Muscle Cramps Muscle cramps are due to sudden involuntary muscle contraction. This means you have no control over the tightening of a muscle (or muscles). Often there are no obvious causes. Muscle cramps may occur with overexertion. They may also occur with chilling of the muscles. An example of a muscle chilling activity is swimming. It is uncommon for cramps to be due to a serious underlying disorder. In most cases, muscle cramps improve (or leave) within minutes. CAUSES  Some common causes are:  Injury.   Infections, especially viral.   Abnormal levels of the salts and ions in your blood (electrolytes). This could happen if you are taking water pills (diuretics).   Blood vessel disease where not enough blood is getting to the muscles (intermittent claudication).  Some uncommon causes are:  Side effects of some medicine (such as lithium).   Alcohol abuse.   Diseases where there is soreness (inflammation) of the muscular system.  HOME CARE INSTRUCTIONS   It may be helpful to massage, stretch, and relax the affected muscle.   Taking a dose of over-the-counter diphenhydramine is helpful for night leg cramps.  SEEK MEDICAL CARE IF:  Cramps are frequent and not relieved with medicine. MAKE SURE YOU:   Understand these instructions.   Will watch your condition.   Will get help right away if you are not doing well or get worse.  Document Released: 03/13/2002 Document Revised: 09/10/2011 Document Reviewed: 09/12/2008 [facility] Patient Information 2012 [place], [place].  RESOURCE GUIDE  Dental Problems  Patients with Medicaid: [facility] [phone] [address]                                           [phone] [address] Phone:  [phone]                                                   Phone:  [phone]  If unable to pay or uninsured, contact:  [facility] or [facility] to become qualified for the adult dental  clinic.  Chronic Pain Problems Contact [facility]  [phone] Patients need to be referred by their primary care doctor.  Insufficient Money for Medicine Contact [employer] Way:  call "211" or [facility] [phone].  No Primary Care Doctor Call [facility]  [phone] Other agencies that provide inexpensive medical care    [facility]  [phone]    [facility]  [phone]    [facility]  [phone]    [facility]  [phone]    [facility]  [phone]    [facility]  [phone]  Psychological Services [facility]  [phone] [facility]  [phone] [facility]   [phone] (emergency services [phone])  Abuse/Neglect [facility] Child Abuse Hotline [phone] [facility] Child Abuse Hotline [phone] (After Hours)  Emergency Shelter [facility] [phone]  [facility] [phone] [facility] [phone]  MRSA Hotline #:   9364589225    [facility]  [facility] [place]  United [employer]                           [facility] [address] [place]                     [address]         [address]  Blondell Reveal Phone:  [phone]                                  Phone:  [phone]                   Phone:  [phone]  [facility] Mental Health Phone:  [phone]  [facility] Child Abuse Hotline [phone] (After Hours)
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
## 2012-04-11 ENCOUNTER — Ambulatory Visit (INDEPENDENT_AMBULATORY_CARE_PROVIDER_SITE_OTHER): Payer: Medicare Other | Admitting: Family Medicine

## 2012-04-11 ENCOUNTER — Encounter: Payer: Self-pay | Admitting: Family Medicine

## 2012-04-11 VITALS — BP 127/83 | HR 98 | Temp 98.2°F | Ht 59.0 in | Wt 186.4 lb

## 2012-04-11 DIAGNOSIS — M542 Cervicalgia: Secondary | ICD-10-CM

## 2012-04-11 MED ORDER — DIAZEPAM 2 MG PO TABS: 2.0000 mg | ORAL_TABLET | Freq: Four times a day (QID) | ORAL | Status: DC | PRN

## 2012-04-11 MED ORDER — BACLOFEN 10 MG PO TABS: 10.0000 mg | ORAL_TABLET | Freq: Three times a day (TID) | ORAL | Status: AC

## 2012-04-11 NOTE — Patient Instructions (Addendum)
The Baclofen is your new muscle relaxer (instead of the Flexeril) and you can take this up to 3x/day Use the Valium only for severe spasm Continue the vicodin for pain Call with any questions or concerns Hang in there!

## 2012-04-11 NOTE — Progress Notes (Signed)
  Subjective:    Patient ID: Chelsea [name], female    DOB: Feb 23, 1946, 66 y.o.   MRN: [id number]  HPI Neck pain- went to ER on July 2nd for severe neck pain, sensation of neck and occiput swelling.  Hx of spinal stenosis.  Was dx'd w/ severe muscle spasm.  On Vicodin, flexeril, xanax.  Was given toradol and valium in ER w/ some relief.  Pain was 10/10 on presentation, dropped to 6/10.  ER doc recommended changing muscle relaxer.  Today pain is a 4/10- now taking Flexeril BID.   Review of Systems For ROS see HPI     Objective:   Physical Exam  Vitals reviewed. Constitutional: She appears well-developed and well-nourished. No distress.  Neck:       Bilateral trap spasm  Lymphadenopathy:    She has no cervical adenopathy.          Assessment & Plan:

## 2012-04-26 NOTE — Assessment & Plan Note (Signed)
New.  Reviewed ER records and meds given.  Pt w/ hx of severe spinal stenosis but reports this pain was different.  Pt continues to have trap spasm bilaterally.  Will switch Flexeril to Baclofen and pt to use Valium only for severe spasm.  Continue Vicodin prn for spinal stenosis pain.  Scripts given.  Reviewed supportive care and red flags that should prompt return.  Pt expressed understanding and is in agreement w/ plan.

## 2012-04-28 ENCOUNTER — Telehealth: Payer: Self-pay | Admitting: Family Medicine

## 2012-04-28 MED ORDER — HYDROCODONE-ACETAMINOPHEN 5-500 MG PO TABS: 1.0000 | ORAL_TABLET | ORAL | Status: DC | PRN

## 2012-04-28 NOTE — Telephone Encounter (Signed)
.  rx faxed to pharmacy, manually.  

## 2012-04-28 NOTE — Telephone Encounter (Signed)
Ok for #60, 1 refill 

## 2012-04-28 NOTE — Telephone Encounter (Signed)
Refill: Vicodin 5-500mg  tab. Take one tablet by mouth every 4 hours as needed. Qty 60. Last fill 03-30-12

## 2012-04-28 NOTE — Telephone Encounter (Signed)
Last OV 04-11-12 last refill 03-03-12 #60 with 1 refill

## 2012-05-16 ENCOUNTER — Ambulatory Visit (INDEPENDENT_AMBULATORY_CARE_PROVIDER_SITE_OTHER): Payer: Medicare Other | Admitting: Family Medicine

## 2012-05-16 ENCOUNTER — Encounter: Payer: Self-pay | Admitting: Family Medicine

## 2012-05-16 VITALS — BP 127/78 | HR 90 | Temp 98.8°F | Ht 59.5 in | Wt 186.2 lb

## 2012-05-16 DIAGNOSIS — E785 Hyperlipidemia, unspecified: Secondary | ICD-10-CM

## 2012-05-16 DIAGNOSIS — I10 Essential (primary) hypertension: Secondary | ICD-10-CM

## 2012-05-16 DIAGNOSIS — Z0000 Encounter for general adult medical examination without abnormal findings: Secondary | ICD-10-CM | POA: Insufficient documentation

## 2012-05-16 LAB — LIPID PANEL
Cholesterol: 176 mg/dL (ref 0–200)
HDL: 65.6 mg/dL (ref >39.00)
LDL Cholesterol: 99 mg/dL (ref 0–99)
Total CHOL/HDL Ratio: 3
Triglycerides: 58 mg/dL (ref 0.0–149.0)
VLDL: 11.6 mg/dL (ref 0.0–40.0)

## 2012-05-16 LAB — CBC WITH DIFFERENTIAL/PLATELET
Basophils Absolute: 0 10*3/uL (ref 0.0–0.1)
Basophils Relative: 0.5 % (ref 0.0–3.0)
Eosinophils Absolute: 0.1 10*3/uL (ref 0.0–0.7)
Eosinophils Relative: 3.1 % (ref 0.0–5.0)
HCT: 36.5 % (ref 36.0–46.0)
Hemoglobin: 11.7 g/dL — ABNORMAL LOW (ref 12.0–15.0)
Lymphocytes Relative: 47.2 % — ABNORMAL HIGH (ref 12.0–46.0)
Lymphs Abs: 2.2 10*3/uL (ref 0.7–4.0)
MCHC: 32 g/dL (ref 30.0–36.0)
MCV: 89.9 fl (ref 78.0–100.0)
Monocytes Absolute: 0.4 10*3/uL (ref 0.1–1.0)
Monocytes Relative: 9.1 % (ref 3.0–12.0)
Neutro Abs: 1.9 10*3/uL (ref 1.4–7.7)
Neutrophils Relative %: 40.1 % — ABNORMAL LOW (ref 43.0–77.0)
Platelets: 215 10*3/uL (ref 150.0–400.0)
RBC: 4.06 Mil/uL (ref 3.87–5.11)
RDW: 14.8 % — ABNORMAL HIGH (ref 11.5–14.6)
WBC: 4.7 10*3/uL (ref 4.5–10.5)

## 2012-05-16 LAB — HEPATIC FUNCTION PANEL
ALT: 17 U/L (ref 0–35)
AST: 18 U/L (ref 0–37)
Albumin: 3.8 g/dL (ref 3.5–5.2)
Alkaline Phosphatase: 55 U/L (ref 39–117)
Bilirubin, Direct: 0 mg/dL (ref 0.0–0.3)
Total Bilirubin: 0.6 mg/dL (ref 0.3–1.2)
Total Protein: 8 g/dL (ref 6.0–8.3)

## 2012-05-16 LAB — BASIC METABOLIC PANEL
BUN: 18 mg/dL (ref 6–23)
CO2: 30 mEq/L (ref 19–32)
Calcium: 9.5 mg/dL (ref 8.4–10.5)
Chloride: 101 mEq/L (ref 96–112)
Creatinine, Ser: 0.9 mg/dL (ref 0.4–1.2)
GFR: 79.3 mL/min (ref >60.00)
Glucose, Bld: 81 mg/dL (ref 70–99)
Potassium: 3.4 mEq/L — ABNORMAL LOW (ref 3.5–5.1)
Sodium: 139 mEq/L (ref 135–145)
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
## 2012-05-17 ENCOUNTER — Encounter: Payer: Self-pay | Admitting: *Deleted

## 2012-05-26 ENCOUNTER — Telehealth: Payer: Self-pay | Admitting: Family Medicine

## 2012-05-26 NOTE — Telephone Encounter (Signed)
pt called regarding colonoscopy she stated her last one was in 2010 & that you were going to schedule her an appt to have another one. She is not sure if this is to soon or not Cb# [phone]

## 2012-05-27 NOTE — Telephone Encounter (Signed)
Pt states she does not need a colonoscopy at this time. Please disregard her request.

## 2012-05-29 NOTE — Assessment & Plan Note (Signed)
Chronic problem.  Adequate control.  Asymptomatic.  No changes. 

## 2012-05-29 NOTE — Assessment & Plan Note (Signed)
Pt UTD on health maintenance.  PE WNL w/ exception of obesity and limited mobility due to spinal stenosis.  Check labs.  Anticipatory guidance provided.

## 2012-05-29 NOTE — Assessment & Plan Note (Signed)
Chronic problem, tolerating statin w/out difficulty.  Check labs.  Adjust meds prn  

## 2012-05-30 ENCOUNTER — Other Ambulatory Visit: Payer: Self-pay | Admitting: Family Medicine

## 2012-05-31 NOTE — Telephone Encounter (Signed)
Noted MD Tabori sent RX via escribe

## 2012-05-31 NOTE — Telephone Encounter (Signed)
Last OV 05-16-12 last refill noted in chart as 02-02-12 #30 with 3 refills however noted possible reconcile/historical provider on 04-28-12

## 2012-06-24 ENCOUNTER — Other Ambulatory Visit: Payer: Self-pay | Admitting: Family Medicine

## 2012-06-24 MED ORDER — HYDROCODONE-ACETAMINOPHEN 5-500 MG PO TABS: 1.0000 | ORAL_TABLET | ORAL | Status: DC | PRN

## 2012-06-24 MED ORDER — FLUOXETINE HCL 40 MG PO CAPS: 40.0000 mg | ORAL_CAPSULE | Freq: Every day | ORAL | Status: DC

## 2012-06-24 NOTE — Addendum Note (Signed)
Addended by: Arnette Norris on: 06/24/2012 04:00 PM   Modules accepted: Orders

## 2012-06-24 NOTE — Telephone Encounter (Signed)
Refill x1 only 

## 2012-06-24 NOTE — Telephone Encounter (Signed)
Fluoxetine 40 mg capsule Qty:30 Last refill: 05/28/12 Take one capsule by mouth everyday

## 2012-06-24 NOTE — Addendum Note (Signed)
Addended by: Arnette Norris on: 06/24/2012 02:36 PM   Modules accepted: Orders

## 2012-06-24 NOTE — Telephone Encounter (Addendum)
Hydrocodone refill---Last seen 05/16/12 and filled 04/28/12 # 60 with 1 refill. Please advise     KP

## 2012-06-24 NOTE — Telephone Encounter (Signed)
Rx faxed.    KP 

## 2012-06-24 NOTE — Telephone Encounter (Signed)
Hydroco/acetamin op 5- 500mg  tablet Qty:60 Last refill: 05/27/12 Take one tablet by mouth every 4 hours as needed

## 2012-07-20 ENCOUNTER — Encounter: Payer: Self-pay | Admitting: Family Medicine

## 2012-07-20 ENCOUNTER — Ambulatory Visit (INDEPENDENT_AMBULATORY_CARE_PROVIDER_SITE_OTHER): Payer: Medicare Other | Admitting: Family Medicine

## 2012-07-20 VITALS — BP 128/84 | HR 82 | Temp 98.5°F | Ht 59.0 in | Wt 186.8 lb

## 2012-07-20 DIAGNOSIS — F341 Dysthymic disorder: Secondary | ICD-10-CM

## 2012-07-20 DIAGNOSIS — F418 Other specified anxiety disorders: Secondary | ICD-10-CM

## 2012-07-20 NOTE — Patient Instructions (Addendum)
Please make sure you are safe and take care of yourself! I know you are in a difficult situation Call with any questions or concerns Hang in there!
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
## 2012-07-26 ENCOUNTER — Other Ambulatory Visit: Payer: Self-pay | Admitting: *Deleted

## 2012-07-26 MED ORDER — DIAZEPAM 2 MG PO TABS: 2.0000 mg | ORAL_TABLET | Freq: Four times a day (QID) | ORAL | Status: AC | PRN
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
## 2012-07-26 NOTE — Telephone Encounter (Signed)
Noted approval for pt Diazepam per Tedd Sias noting authorization end date 10-04-13, MD Beverely Low gave verbal order to give pt #30 with 3 refills, faxed signed RX to pharmacy [facility] on [address]

## 2012-07-27 ENCOUNTER — Other Ambulatory Visit: Payer: Self-pay | Admitting: Family Medicine

## 2012-07-27 MED ORDER — TRIAMTERENE-HCTZ 37.5-25 MG PO TABS: 1.0000 | ORAL_TABLET | Freq: Every day | ORAL | Status: DC

## 2012-07-27 MED ORDER — ALPRAZOLAM 0.5 MG PO TABS: 0.5000 mg | ORAL_TABLET | Freq: Three times a day (TID) | ORAL | Status: DC | PRN

## 2012-07-27 NOTE — Telephone Encounter (Signed)
Will manually fax RX in am when MD Beverely Low returns to sign RX

## 2012-07-27 NOTE — Telephone Encounter (Signed)
OV 07/20/12. Hydrocodone last filled 06/24/12 #60 x 0.   Plz advise     MW

## 2012-07-27 NOTE — Telephone Encounter (Signed)
Ok for Hydrocodone #60, 3 refills Triamterene- HCTZ  #60, 3 refills Xanax #90, 3 refills

## 2012-07-27 NOTE — Telephone Encounter (Signed)
Refill: Triam/hctz 37.5-25 tab. Take one tablet by mouth every day. Qty 30. Last fill 06-24-12  Xanax 0.5mg  tab. Take one tablet by mouth three times daily as needed. Qty 90. Last fill 9.23.13

## 2012-07-28 NOTE — Telephone Encounter (Signed)
Faxed manually with MD Tabori signature

## 2012-08-01 ENCOUNTER — Other Ambulatory Visit: Payer: Self-pay | Admitting: Family Medicine

## 2012-08-01 MED ORDER — SIMVASTATIN 20 MG PO TABS: 20.0000 mg | ORAL_TABLET | Freq: Every day | ORAL | Status: DC

## 2012-08-01 NOTE — Telephone Encounter (Signed)
refill Simvastatin (Tab) ZOCOR 20 MG Take 1 tablet (20 mg total) by mouth daily # 30 last fill 10.7.13  Last ov 10.16.13-acute

## 2012-08-01 NOTE — Telephone Encounter (Signed)
Refill done.  

## 2012-08-24 ENCOUNTER — Telehealth: Payer: Self-pay | Admitting: Family Medicine

## 2012-08-24 NOTE — Telephone Encounter (Signed)
Patient called requesting antibiotic for cold and did not want to come in for assessment.  RN verified demographics, attempted to verify clinical profile, patient resistant to assessment just wanting medication - ended call.

## 2012-08-24 NOTE — Telephone Encounter (Signed)
Discuss with patient who decline to schedule OV.

## 2012-08-24 NOTE — Telephone Encounter (Signed)
No abx w/out being seen

## 2012-08-26 ENCOUNTER — Ambulatory Visit (INDEPENDENT_AMBULATORY_CARE_PROVIDER_SITE_OTHER): Payer: Medicare Other | Admitting: Family Medicine

## 2012-08-26 ENCOUNTER — Encounter: Payer: Self-pay | Admitting: Family Medicine

## 2012-08-26 ENCOUNTER — Telehealth: Payer: Self-pay

## 2012-08-26 VITALS — BP 122/70 | HR 85 | Temp 98.3°F | Wt 184.0 lb

## 2012-08-26 DIAGNOSIS — J329 Chronic sinusitis, unspecified: Secondary | ICD-10-CM | POA: Insufficient documentation

## 2012-08-26 DIAGNOSIS — J209 Acute bronchitis, unspecified: Secondary | ICD-10-CM

## 2012-08-26 DIAGNOSIS — J449 Chronic obstructive pulmonary disease, unspecified: Secondary | ICD-10-CM

## 2012-08-26 MED ORDER — CLARITHROMYCIN ER 500 MG PO TB24: 1000.0000 mg | ORAL_TABLET | Freq: Every day | ORAL | Status: DC

## 2012-08-26 MED ORDER — GUAIFENESIN-CODEINE 100-10 MG/5ML PO SYRP: 10.0000 mL | ORAL_SOLUTION | Freq: Three times a day (TID) | ORAL | Status: DC | PRN

## 2012-08-26 NOTE — Patient Instructions (Addendum)
You have a sinus infection and bronchitis Start the Biaxin- 2 tabs daily at the same time- take w/ food Use the cough syrup as needed- it may make you drowsy Use the ProAir inhaler- 2 puffs every 4 hrs as needed for cough or wheezing REST! Call with any questions or concerns Happy Thanksgiving!

## 2012-08-26 NOTE — Assessment & Plan Note (Signed)
New.  Pt's sxs and PE consistent w/ infxn.  Start abx.  Reviewed supportive care and red flags that should prompt return.  Pt expressed understanding and is in agreement w/ plan.  

## 2012-08-26 NOTE — Progress Notes (Signed)
  Subjective:    Patient ID: Chelsea [name], female    DOB: 07-07-1946, 66 y.o.   MRN: [id number]  HPI URI- sxs started 8 days ago, started OTC Dayquil/Nyquil w/out relief.  sxs started w/ sore throat.  Progressed to HA.  + cough- wet but not productive.  + wheezing.  + facial pain/pressure.  No fevers.  No known sick contacts.  No nausea/vomiting.   Review of Systems For ROS see HPI     Objective:   Physical Exam  Constitutional: She appears well-developed and well-nourished. No distress.  HENT:  Head: Normocephalic and atraumatic.  Right Ear: Tympanic membrane normal.  Left Ear: Tympanic membrane normal.  Nose: Mucosal edema and rhinorrhea present. Right sinus exhibits maxillary sinus tenderness and frontal sinus tenderness. Left sinus exhibits maxillary sinus tenderness and frontal sinus tenderness.  Mouth/Throat: Uvula is midline and mucous membranes are normal. Posterior oropharyngeal erythema present. No oropharyngeal exudate.  Eyes: Conjunctivae normal and EOM are normal. Pupils are equal, round, and reactive to light.  Neck: Normal range of motion. Neck supple.  Cardiovascular: Normal rate, regular rhythm and normal heart sounds.   Pulmonary/Chest: Effort normal. No respiratory distress. She has wheezes (scattered expiratory wheezes).       Coarse BS diffusely but particularly at R base  Lymphadenopathy:    She has no cervical adenopathy.          Assessment & Plan:

## 2012-08-26 NOTE — Telephone Encounter (Signed)
She was given a ProAir inhaler in the office for her wheezing/shortness of breath/coughing fits.  A 2nd inhaler will not add anything or prevent her cough.  She will need to buy the cough syrup of her choice since her insurance no longer covers this

## 2012-08-26 NOTE — Assessment & Plan Note (Signed)
New.  Due to coarse BS and wheezing will start macrolide to cover for atypical infxn.  Cough syrup prn.  Albuterol HFA given and pt instructed on use.  Use expressed understanding and administered 1st dose in office.

## 2012-08-26 NOTE — Telephone Encounter (Signed)
Discuss with patient  

## 2012-08-26 NOTE — Telephone Encounter (Signed)
Call from the pharmacy and the patient is requesting an inhaler for her cough since ins will not pay for cough medication. She has already been advised to pay out of pocket or get Robitussin or Delsym or her cough. Please advise     KP

## 2012-10-18 ENCOUNTER — Other Ambulatory Visit: Payer: Self-pay | Admitting: Family Medicine

## 2012-10-18 ENCOUNTER — Encounter: Payer: Self-pay | Admitting: Lab

## 2012-10-19 ENCOUNTER — Encounter: Payer: Self-pay | Admitting: Family Medicine

## 2012-10-19 ENCOUNTER — Ambulatory Visit (INDEPENDENT_AMBULATORY_CARE_PROVIDER_SITE_OTHER): Payer: Medicare Other | Admitting: Family Medicine

## 2012-10-19 VITALS — BP 106/70 | HR 87 | Temp 97.5°F | Ht 59.25 in | Wt 187.2 lb

## 2012-10-19 DIAGNOSIS — M19079 Primary osteoarthritis, unspecified ankle and foot: Secondary | ICD-10-CM

## 2012-10-19 NOTE — Patient Instructions (Addendum)
This appears to be an arthritic inflammation of the toes Start tylenol for pain Wear good, supportive/cushioned shoes Call with any questions or concerns Hang in there!!!

## 2012-10-19 NOTE — Progress Notes (Signed)
  Subjective:    Patient ID: Chelsea [name], female    DOB: 01-04-1946, 67 y.o.   MRN: [id number]  HPI Toe pain- sxs started 2 days ago.  Bilateral great toes 'feel swollen but don't look swollen'.  'very sensitive'.  Pain described as an 'aching, throbbing w/ a little numbness to it, too'.  Painful to touch.  No hx of similar.  Denies recent change in footwear.  Has been on her feet more due to attempting to get house in order for possible sale.   Review of Systems For ROS see HPI     Objective:   Physical Exam  Vitals reviewed. Constitutional: She is oriented to person, place, and time. She appears well-developed and well-nourished. No distress.  Musculoskeletal: She exhibits tenderness (over 1st MTP joints bilaterally w/out swelling, redness, or warmth.  limited planter/dorsiflexion- tender). She exhibits no edema.  Neurological: She is alert and oriented to person, place, and time.          Assessment & Plan:

## 2012-10-20 ENCOUNTER — Other Ambulatory Visit: Payer: Self-pay | Admitting: *Deleted

## 2012-10-20 DIAGNOSIS — R52 Pain, unspecified: Secondary | ICD-10-CM

## 2012-10-20 MED ORDER — OXYCODONE-ACETAMINOPHEN 5-325 MG PO TABS: 1.0000 | ORAL_TABLET | Freq: Three times a day (TID) | ORAL | Status: DC | PRN

## 2012-10-20 NOTE — Telephone Encounter (Signed)
Spoke with Dr. Beverely Low at [facility], gave okay to change vicodin to percocet per pharmacy request. Script printed and faxed to pharmacy.

## 2012-10-21 ENCOUNTER — Other Ambulatory Visit: Payer: Self-pay | Admitting: Family Medicine

## 2012-10-21 NOTE — Telephone Encounter (Signed)
Flexeril 10 mg tab Qty: 30  Take one tablet by mouth at bedtime

## 2012-10-21 NOTE — Telephone Encounter (Signed)
Please advise on RF request.  Last OV:10-19-12.//AB/CMA

## 2012-10-24 NOTE — Assessment & Plan Note (Signed)
New.  No hx of gout.  PE and hx unremarkable.  Suspect inflammation due to recent increase in activity.  Tylenol, ice prn.  Wear supportive, cushioned shoes.  Will follow.

## 2012-10-31 ENCOUNTER — Telehealth: Payer: Self-pay | Admitting: Family Medicine

## 2012-10-31 NOTE — Telephone Encounter (Signed)
Patient is leaving in 2 days. Patient would like to know what to do about her medication. Please call her.

## 2012-10-31 NOTE — Telephone Encounter (Signed)
Spoke with the pt and she was given a new Rx- Oxycodone-acetaminophen 5-325mg .  Pt is calling because she is concern about the new med because she has never taking it before, and she has always taking the Vicodin(which the dose she was taking has been discontinued).  She stated that she took one this am and has been okay today, but concern about taking one tonight for pain if she need to.  She also stated that since she will be leaving town what is she to do when she runs out of meds.  The rx was for #20 and no refills.   She wanted to know why she could not have gotten a lower dose of the Vicodin.//AB/CMA

## 2012-10-31 NOTE — Telephone Encounter (Signed)
LM @ (3:00pm) asking the pt to RTC.//AB/CMA

## 2012-11-01 ENCOUNTER — Telehealth: Payer: Self-pay | Admitting: *Deleted

## 2012-11-01 NOTE — Telephone Encounter (Signed)
Agree w/ pt- this was a mistake.  She should have received a new script for Vicodin 5/325mg  NOT percocet.  We will correct this and send the new medicine for her.  The medicine she currently has will not harm her but it stronger and should be saved for severe pain.  NEW SCRIPT- hydrocodone 5/325mg  #30, no refills

## 2012-11-01 NOTE — Telephone Encounter (Signed)
Spoke with patient, advised that we would send in new script for Vicodin and she needed to bring the percocet back to [place]. She verbalized understanding of this.

## 2012-11-01 NOTE — Telephone Encounter (Signed)
LM @ (2:59pm) asking the pt to RTC.//AB/CMA

## 2012-11-01 NOTE — Telephone Encounter (Signed)
Spoke with Dois Davenport and she will handle the call.//AB/CMA

## 2012-11-02 ENCOUNTER — Other Ambulatory Visit: Payer: Self-pay | Admitting: *Deleted

## 2012-11-02 ENCOUNTER — Telehealth: Payer: Self-pay | Admitting: *Deleted

## 2012-11-02 DIAGNOSIS — M542 Cervicalgia: Secondary | ICD-10-CM

## 2012-11-02 MED ORDER — HYDROCODONE-ACETAMINOPHEN 5-325 MG PO TABS: 1.0000 | ORAL_TABLET | Freq: Four times a day (QID) | ORAL | Status: DC | PRN

## 2012-11-02 NOTE — Telephone Encounter (Signed)
Fax from [employer] that flexeril will no longer be covered. Pt was given a transional fill. Per Dr Beverely Low change Pt to tizanidine 2 mg tid prn #60 1 refill. Left message to call office to advise Pt of med change.

## 2012-11-02 NOTE — Telephone Encounter (Signed)
Rx for Vicodin and controlled substance agreement printed and placed up front. Pt will come to office to pick up.

## 2012-11-02 NOTE — Telephone Encounter (Signed)
Patient called office states that she will bring percocet back to [place] as requested within the hour.

## 2012-11-21 ENCOUNTER — Other Ambulatory Visit: Payer: Self-pay | Admitting: Family Medicine

## 2012-11-21 NOTE — Telephone Encounter (Signed)
[facility]  Ph # [phone]

## 2012-11-21 NOTE — Telephone Encounter (Signed)
LM ON triage [facility] in [place] wants refills for Hydrocodone & Alprazolam for this pt cb# [phone]

## 2012-11-22 ENCOUNTER — Telehealth: Payer: Self-pay | Admitting: Family Medicine

## 2012-11-22 DIAGNOSIS — M542 Cervicalgia: Secondary | ICD-10-CM

## 2012-11-22 MED ORDER — ALPRAZOLAM 0.5 MG PO TABS: 0.5000 mg | ORAL_TABLET | Freq: Three times a day (TID) | ORAL | Status: DC | PRN

## 2012-11-22 MED ORDER — HYDROCODONE-ACETAMINOPHEN 5-325 MG PO TABS: 1.0000 | ORAL_TABLET | Freq: Four times a day (QID) | ORAL | Status: DC | PRN

## 2012-11-22 NOTE — Telephone Encounter (Signed)
Ok for #30 vicodin, #90 of xanax.  No refills.  Pt needs to establish w/ new MD in area

## 2012-11-22 NOTE — Telephone Encounter (Signed)
Patient Information:  Caller Name: [place]  Phone: [phone]  Patient: Chelsea [name], Chelsea [name]  Gender: Female  DOB: [birth year]  Age: 67 Years  PCP: Sheliah Hatch.  Office Follow Up:  Does the office need to follow up with this patient?: Yes  Instructions For The Office: Please follow up with patient regarding her request for refills.  RN Note:  [facility] in [place], [place] Phone number [phone].  Symptoms  Reason For Call & Symptoms: Patient reports she is in [place], [place] with family.  Requests refill  for Vicodin and Xanax (generic).  Must have authorization for same, she relates she has not gotten response to her request that was started before the snow.  She states local [facility] usually gets response from office quickly, but the one near where she is has not been able to get Rx.  Reviewed Health History In EMR: Yes  Reviewed Medications In EMR: Yes  Reviewed Allergies In EMR: Yes  Reviewed Surgeries / Procedures: Yes  Date of Onset of Symptoms: Unknown  Guideline(s) Used:  No Protocol Available - Information Only  Disposition Per Guideline:   Discuss with PCP and Callback by Nurse Today  Reason For Disposition Reached:   Nursing judgment  Advice Given:  Call Back If:  New symptoms develop  You become worse.

## 2012-11-22 NOTE — Telephone Encounter (Signed)
Discuss with patient who indicated that she has not moved to [place] she is currently visiting. Pt indicated Dr Beverely Low knows the situation with her husband. Pt also note that she is not currently planning on changing provider and has appt to see you in May. Per Epic no appt on file. Pt aware Rx sent

## 2012-11-23 ENCOUNTER — Telehealth: Payer: Self-pay | Admitting: *Deleted

## 2012-11-23 NOTE — Telephone Encounter (Signed)
I'm very confused b/c when pt stopped by here in January she indicated she was moving to the [place] area to live w/ her daughter and she thanked me for all I had done and said goodbye.  If she is not moving, we will be happy to see her but there is no appt scheduled for May

## 2012-11-23 NOTE — Telephone Encounter (Signed)
See telephone encounter.//AB/CMA

## 2012-11-23 NOTE — Telephone Encounter (Signed)
Spoke with patient, states [facility] in [place] did not Rx's. I called the pharmacy and gave verbal orders to fill as they could not read the faxes sent by our office. Patient notified.

## 2012-11-29 NOTE — Telephone Encounter (Signed)
Left message on VM for patient to return call when available  

## 2012-11-29 NOTE — Telephone Encounter (Signed)
returned your call can call her back at  [phone]

## 2012-11-29 NOTE — Telephone Encounter (Signed)
LM @ (9:28am) on [phone]) asking the pt to RTC.//AB/CMA

## 2012-11-30 NOTE — Telephone Encounter (Signed)
Spoke with the pt and she stated that she has not moved to [place].  She was just there until they were able to get her husband in the hosp, which they have done.  She said that Dr. Beverely Low is still her doctor.  She said the reason the [facility] here was requesting the refill, is b/c the pharmacy in [place] would not take a transfer from the [facility] here they were requesting a new rx, so [facility] was trying to get the rx so they could sent to [place].    Pt stated that she will be back in town next week and she will make an appt.//AB/CMA

## 2012-11-30 NOTE — Telephone Encounter (Signed)
Noted  

## 2012-12-16 ENCOUNTER — Other Ambulatory Visit: Payer: Self-pay | Admitting: Family Medicine

## 2012-12-19 ENCOUNTER — Telehealth: Payer: Self-pay | Admitting: *Deleted

## 2012-12-19 MED ORDER — ALPRAZOLAM 0.5 MG PO TABS: 0.5000 mg | ORAL_TABLET | Freq: Three times a day (TID) | ORAL | Status: DC | PRN

## 2012-12-19 NOTE — Telephone Encounter (Signed)
Unfortunately the DEA changed the classification of hydrocodone and we cannot fax this- even if we want to.  When she was told to call me, I thought she understood that she was to call and speak w/ my office- I see pt's for the entire time I am at work and rarely able to speak w/ pts.  If pt wants to continue narcotic pain medication, she will need to find a local doctor in [place].  We frequently have pt's who split their time between 2 places and we share their care w/ the other local [place].  This would be in her best interest.

## 2012-12-19 NOTE — Telephone Encounter (Signed)
Called spoke with the pt and informed her that we can no longer fax Hydrocodone rx she will have to pick it up.  The pt was kind of upset b/c she's having trouble each month trying to get refills on her meds.  Tried to explain to the pt that things had changed regarding faxing the Hydrocodone,and we also was making sure of where she was living.   Pt stated that she's living in [place] right now but she has not moved there.  She stated that she's to schedule an appt with Dr. Beverely Low in May.  Pt was upset b/c she was told by Dr. Beverely Low if she needs anything to call, and when she calls she has not gotten a chance to speak with Dr. Beverely Low.  Tried to explain to the pt that Dr. Beverely Low is with pt's and can not speak with her at the times she calls.  Pt stated that she would like for Dr Beverely Low to give her some else for pain,and she wants Dr. Beverely Low to call and talk with her.  Informed the pt I will let Dr. Beverely Low know of this.//AB/CMA

## 2012-12-19 NOTE — Telephone Encounter (Signed)
Please advise on RF request.  Last OV:07-20-12,last RF:11-22-12.//AB/CMA

## 2012-12-21 ENCOUNTER — Telehealth: Payer: Self-pay | Admitting: *Deleted

## 2012-12-21 NOTE — Telephone Encounter (Signed)
Attempted to contact pt to explain why hydrocodone could no longer be faxed, must be picked up due to [facility] drug classification changes. Unable to speak with pt directly, lmovm to return my call.

## 2012-12-21 NOTE — Telephone Encounter (Signed)
Patient returned my call, states she "did not want to speak to anyone else about this, that she would have to do the best she could until she could get in to see Dr. Beverely Low in May." I attempted to explain that Dr. Beverely Low suggested that she find a PCP in [place] to care for her while she was there but pt stated she only wanted to see Dr. Beverely Low.

## 2013-01-30 ENCOUNTER — Ambulatory Visit: Payer: Medicare Other | Admitting: Family Medicine

## 2013-02-07 ENCOUNTER — Encounter: Payer: Self-pay | Admitting: Lab

## 2013-02-08 ENCOUNTER — Encounter: Payer: Self-pay | Admitting: Family Medicine

## 2013-02-08 ENCOUNTER — Ambulatory Visit (INDEPENDENT_AMBULATORY_CARE_PROVIDER_SITE_OTHER): Payer: Medicare Other | Admitting: Family Medicine

## 2013-02-08 VITALS — BP 120/70 | HR 81 | Temp 97.8°F | Ht 59.25 in | Wt 189.2 lb

## 2013-02-08 DIAGNOSIS — E785 Hyperlipidemia, unspecified: Secondary | ICD-10-CM

## 2013-02-08 DIAGNOSIS — I10 Essential (primary) hypertension: Secondary | ICD-10-CM

## 2013-02-08 DIAGNOSIS — M4800 Spinal stenosis, site unspecified: Secondary | ICD-10-CM

## 2013-02-08 MED ORDER — TRIAMTERENE-HCTZ 37.5-25 MG PO TABS: 1.0000 | ORAL_TABLET | Freq: Every day | ORAL | Status: DC

## 2013-02-08 MED ORDER — ALPRAZOLAM 0.5 MG PO TABS: 0.5000 mg | ORAL_TABLET | Freq: Three times a day (TID) | ORAL | Status: DC | PRN

## 2013-02-08 MED ORDER — FLUOXETINE HCL 40 MG PO CAPS: 40.0000 mg | ORAL_CAPSULE | Freq: Every day | ORAL | Status: DC

## 2013-02-08 MED ORDER — SIMVASTATIN 20 MG PO TABS: 20.0000 mg | ORAL_TABLET | Freq: Every day | ORAL | Status: DC

## 2013-02-08 MED ORDER — HYDROCODONE-ACETAMINOPHEN 5-325 MG PO TABS: ORAL_TABLET | ORAL | Status: DC

## 2013-02-08 NOTE — Progress Notes (Signed)
  Subjective:    Patient ID: Chelsea [name], female    DOB: Mar 25, 1946, 67 y.o.   MRN: [id number]  HPI Hyperlipidemia- chronic problem, on Zocor.  Denies abd pain, N/V, myalgias  HTN- chronic problem, on Maxzide.  Denies CP, SOB, HAs, visual changes.  Pain- chronic problem, has not been able to get hydrocodone since moving to [place].  Pt reports it is much more expensive to get in [place].  Still not interested in seeing neuro or neurosurg for her severe spinal stenosis.   Review of Systems For ROS see HPI     Objective:   Physical Exam  Vitals reviewed. Constitutional: She is oriented to person, place, and time. She appears well-developed and well-nourished. No distress.  HENT:  Head: Normocephalic and atraumatic.  Eyes: Conjunctivae and EOM are normal. Pupils are equal, round, and reactive to light.  Neck: Normal range of motion. Neck supple. No thyromegaly present.  Cardiovascular: Normal rate, regular rhythm, normal heart sounds and intact distal pulses.   No murmur heard. Pulmonary/Chest: Effort normal and breath sounds normal. No respiratory distress.  Abdominal: Soft. She exhibits no distension. There is no tenderness.  Musculoskeletal: She exhibits no edema.  Lymphadenopathy:    She has no cervical adenopathy.  Neurological: She is alert and oriented to person, place, and time.  Skin: Skin is warm and dry.  Psychiatric: She has a normal mood and affect. Her behavior is normal.          Assessment & Plan:

## 2013-02-08 NOTE — Assessment & Plan Note (Signed)
Chronic problem.  Pt not interested in Neuro or Neurosurg w/u- just wants pain control.  Refill given on hydrocodone- pt to find new MD in [place] area.

## 2013-02-08 NOTE — Patient Instructions (Addendum)
We'll notify you of your lab results and make any changes if needed Call with any questions or concerns or concerns Be safe in your travels! Hang in there!

## 2013-02-08 NOTE — Assessment & Plan Note (Addendum)
Chronic problem.  Tolerating statin w/out difficulty.  Check labs.  Adjust meds prn  

## 2013-02-08 NOTE — Assessment & Plan Note (Signed)
Chronic problem.  Well controlled.  Asymptomatic.  Check labs.  No anticipated changes. 

## 2013-02-09 ENCOUNTER — Telehealth: Payer: Self-pay | Admitting: Family Medicine

## 2013-02-09 LAB — HEPATIC FUNCTION PANEL
ALT: 18 U/L (ref 0–35)
AST: 21 U/L (ref 0–37)
Albumin: 3.6 g/dL (ref 3.5–5.2)
Alkaline Phosphatase: 61 U/L (ref 39–117)
Bilirubin, Direct: 0 mg/dL (ref 0.0–0.3)
Total Bilirubin: 0.5 mg/dL (ref 0.3–1.2)
Total Protein: 7 g/dL (ref 6.0–8.3)

## 2013-02-09 LAB — CBC WITH DIFFERENTIAL/PLATELET
Basophils Absolute: 0 10*3/uL (ref 0.0–0.1)
Basophils Relative: 0.6 % (ref 0.0–3.0)
Eosinophils Absolute: 0.1 10*3/uL (ref 0.0–0.7)
Eosinophils Relative: 2.6 % (ref 0.0–5.0)
HCT: 35.5 % — ABNORMAL LOW (ref 36.0–46.0)
Hemoglobin: 11.8 g/dL — ABNORMAL LOW (ref 12.0–15.0)
Lymphocytes Relative: 45.3 % (ref 12.0–46.0)
Lymphs Abs: 2.6 10*3/uL (ref 0.7–4.0)
MCHC: 33.2 g/dL (ref 30.0–36.0)
MCV: 88.5 fl (ref 78.0–100.0)
Monocytes Absolute: 0.6 10*3/uL (ref 0.1–1.0)
Monocytes Relative: 9.9 % (ref 3.0–12.0)
Neutro Abs: 2.4 10*3/uL (ref 1.4–7.7)
Neutrophils Relative %: 41.6 % — ABNORMAL LOW (ref 43.0–77.0)
Platelets: 211 10*3/uL (ref 150.0–400.0)
RBC: 4.01 Mil/uL (ref 3.87–5.11)
RDW: 14.4 % (ref 11.5–14.6)
WBC: 5.7 10*3/uL (ref 4.5–10.5)

## 2013-02-09 LAB — BASIC METABOLIC PANEL
BUN: 14 mg/dL (ref 6–23)
CO2: 27 mEq/L (ref 19–32)
Calcium: 8.9 mg/dL (ref 8.4–10.5)
Chloride: 105 mEq/L (ref 96–112)
Creatinine, Ser: 1 mg/dL (ref 0.4–1.2)
GFR: 70.97 mL/min (ref >60.00)
Glucose, Bld: 88 mg/dL (ref 70–99)
Potassium: 3.5 mEq/L (ref 3.5–5.1)
Sodium: 140 mEq/L (ref 135–145)

## 2013-02-09 LAB — LDL CHOLESTEROL, DIRECT: Direct LDL: 151.3 mg/dL

## 2013-02-09 LAB — LIPID PANEL
Cholesterol: 227 mg/dL — ABNORMAL HIGH (ref 0–200)
HDL: 53.2 mg/dL (ref >39.00)
Total CHOL/HDL Ratio: 4
Triglycerides: 92 mg/dL (ref 0.0–149.0)
VLDL: 18.4 mg/dL (ref 0.0–40.0)

## 2013-02-09 LAB — TSH: TSH: 1.59 u[IU]/mL (ref 0.35–5.50)

## 2013-02-09 NOTE — Telephone Encounter (Signed)
Patient states we did not send in her cyclobenzaprine to [facility] in [place]

## 2013-02-09 NOTE — Telephone Encounter (Signed)
She would like this sent as soon as possible.

## 2013-02-10 MED ORDER — CYCLOBENZAPRINE HCL 10 MG PO TABS: ORAL_TABLET | ORAL | Status: DC

## 2013-02-10 NOTE — Telephone Encounter (Signed)
Med filled.  

## 2013-02-22 ENCOUNTER — Encounter: Payer: Self-pay | Admitting: *Deleted

## 2013-02-22 ENCOUNTER — Telehealth: Payer: Self-pay | Admitting: *Deleted

## 2013-02-22 NOTE — Telephone Encounter (Signed)
Tried reaching the pt by phone on numerous accounts, but the pt has not called back.  Mailed letter to pt asking her to contact the office.  This is regarding her recent lab results.//AB/CMA

## 2013-02-22 NOTE — Telephone Encounter (Signed)
Message copied by Verdie Shire on Wed Feb 22, 2013 11:55 AM ------      Message from: Sheliah Hatch      Created: Thu Feb 09, 2013  4:44 PM       Labs look good w/ exception of her total cholesterol and LDL.  Has she been taking her Zocor? ------

## 2013-02-23 ENCOUNTER — Encounter: Payer: Self-pay | Admitting: Family Medicine

## 2013-03-14 ENCOUNTER — Telehealth: Payer: Self-pay | Admitting: General Practice

## 2013-03-14 NOTE — Telephone Encounter (Signed)
Pt called and left a message on the triage line regarding a medication. Did not elaborate any further. Called all numbers listed in pt's chart and cannot reach pt.

## 2013-03-15 NOTE — Telephone Encounter (Signed)
Tried reaching pt again today. Closing encounter until pt calls back.

## 2013-03-17 ENCOUNTER — Other Ambulatory Visit: Payer: Self-pay | Admitting: Family Medicine

## 2013-03-20 ENCOUNTER — Other Ambulatory Visit: Payer: Self-pay

## 2013-03-20 ENCOUNTER — Other Ambulatory Visit: Payer: Self-pay | Admitting: *Deleted

## 2013-03-20 MED ORDER — ALPRAZOLAM 0.5 MG PO TABS: 0.5000 mg | ORAL_TABLET | Freq: Three times a day (TID) | ORAL | Status: DC | PRN

## 2013-03-20 NOTE — Telephone Encounter (Signed)
Last OV:02-08-13 Last refills:02-08-13 Please advise./AB/CMA

## 2013-03-20 NOTE — Telephone Encounter (Signed)
Spoke with patient, explained to her that she needed to find a PCP in [place] so they handle her medication refills. Patient states she ahs appt later this month and will ask for a transfer of records at that time. I advised pt that we could fax Xanax but Vicodin must be picked up in the office. Pt resides in [place] at this time so is unable to pick up script in the office. Pt provided me with cell phone of her niece # [phone].

## 2013-03-20 NOTE — Telephone Encounter (Signed)
Ok for #90 of xanax, #30 of hydrocodone.  If pt has moved to [place] permanently, we cannot continue to fill these meds (that was her plan at last visit).  We also need a working # to get in touch w/ her.

## 2013-03-22 ENCOUNTER — Telehealth: Payer: Self-pay | Admitting: Family Medicine

## 2013-03-22 NOTE — Telephone Encounter (Signed)
Patient called requesting that we send an order for her screening mammogram to [facility] ph# [phone] fax# [phone].

## 2013-03-24 NOTE — Telephone Encounter (Signed)
Spoke with the pt and informed her that we can not send orders to another state.  Pt stated that she [facility] telling her that her Mammogram is due.  She said she understood that we could not send the order, and she was told by [facility] that they needed an order to do the mammogram.  Asked the pt if she has find a new PCP where she is, and she stated that she did find one but she could not get an appt until Aug 14th.  Pt stated that she will wait until she have her visit with her new PCP in Aug, and have them order the mammogram.//AB/CMA

## 2013-03-24 NOTE — Telephone Encounter (Signed)
LM @ (10:23am) asking the pt to RTC regarding orders for mammogram.//AB/CMA

## 2013-04-20 ENCOUNTER — Other Ambulatory Visit: Payer: Self-pay | Admitting: Family Medicine

## 2013-04-20 ENCOUNTER — Telehealth: Payer: Self-pay | Admitting: *Deleted

## 2013-04-20 NOTE — Telephone Encounter (Signed)
Ok to refill? Last OV 5.7.14 Last filled 6.16.14

## 2013-04-20 NOTE — Telephone Encounter (Signed)
Pharmacy requesting a refill  Last ov 02/08/13 Last refill 03/20/13 ct 90 please advise.

## 2013-04-20 NOTE — Telephone Encounter (Signed)
Will not fill pt's medicine as she now lives in [place]

## 2013-04-20 NOTE — Telephone Encounter (Signed)
Med already declined

## 2014-11-26 DIAGNOSIS — H43811 Vitreous degeneration, right eye: Secondary | ICD-10-CM | POA: Insufficient documentation

## 2018-01-11 DIAGNOSIS — F32A Depression, unspecified: Secondary | ICD-10-CM | POA: Insufficient documentation

## 2018-01-11 DIAGNOSIS — F419 Anxiety disorder, unspecified: Secondary | ICD-10-CM | POA: Insufficient documentation

## 2018-01-11 DIAGNOSIS — E7800 Pure hypercholesterolemia, unspecified: Secondary | ICD-10-CM | POA: Insufficient documentation

## 2018-01-11 DIAGNOSIS — F3341 Major depressive disorder, recurrent, in partial remission: Secondary | ICD-10-CM | POA: Insufficient documentation

## 2018-01-11 DIAGNOSIS — Z8739 Personal history of other diseases of the musculoskeletal system and connective tissue: Secondary | ICD-10-CM | POA: Insufficient documentation

## 2018-01-11 DIAGNOSIS — F325 Major depressive disorder, single episode, in full remission: Secondary | ICD-10-CM | POA: Insufficient documentation

## 2018-01-11 DIAGNOSIS — F411 Generalized anxiety disorder: Secondary | ICD-10-CM | POA: Insufficient documentation

## 2018-03-15 DIAGNOSIS — Z6841 Body Mass Index (BMI) 40.0 and over, adult: Secondary | ICD-10-CM | POA: Insufficient documentation

## 2018-03-15 DIAGNOSIS — Z780 Asymptomatic menopausal state: Secondary | ICD-10-CM | POA: Insufficient documentation

## 2018-05-03 DIAGNOSIS — Z8619 Personal history of other infectious and parasitic diseases: Secondary | ICD-10-CM | POA: Insufficient documentation

## 2018-05-03 DIAGNOSIS — Z8719 Personal history of other diseases of the digestive system: Secondary | ICD-10-CM | POA: Insufficient documentation

## 2018-05-03 DIAGNOSIS — Z86010 Personal history of colonic polyps: Secondary | ICD-10-CM | POA: Insufficient documentation

## 2018-06-22 ENCOUNTER — Other Ambulatory Visit: Payer: Self-pay | Admitting: Internal Medicine

## 2018-06-22 DIAGNOSIS — Z1231 Encounter for screening mammogram for malignant neoplasm of breast: Secondary | ICD-10-CM

## 2018-08-04 ENCOUNTER — Ambulatory Visit
Admission: RE | Admit: 2018-08-04 | Discharge: 2018-08-04 | Disposition: A | Discharge status: Home / Self Care | Payer: Medicare Other | Source: Ambulatory Visit | Attending: Internal Medicine | Admitting: Internal Medicine

## 2018-08-04 DIAGNOSIS — Z1231 Encounter for screening mammogram for malignant neoplasm of breast: Secondary | ICD-10-CM

## 2018-08-04 IMAGING — MG MM DIGITAL SCREENING BILAT W/ TOMO W/ CAD
8 series · 8 of 24 positions shown · non-contrast
Comparison: Previous exam(s).

CLINICAL DATA: Screening.

EXAM:
DIGITAL SCREENING BILATERAL MAMMOGRAM WITH TOMO AND CAD

[R CC synth-2D]
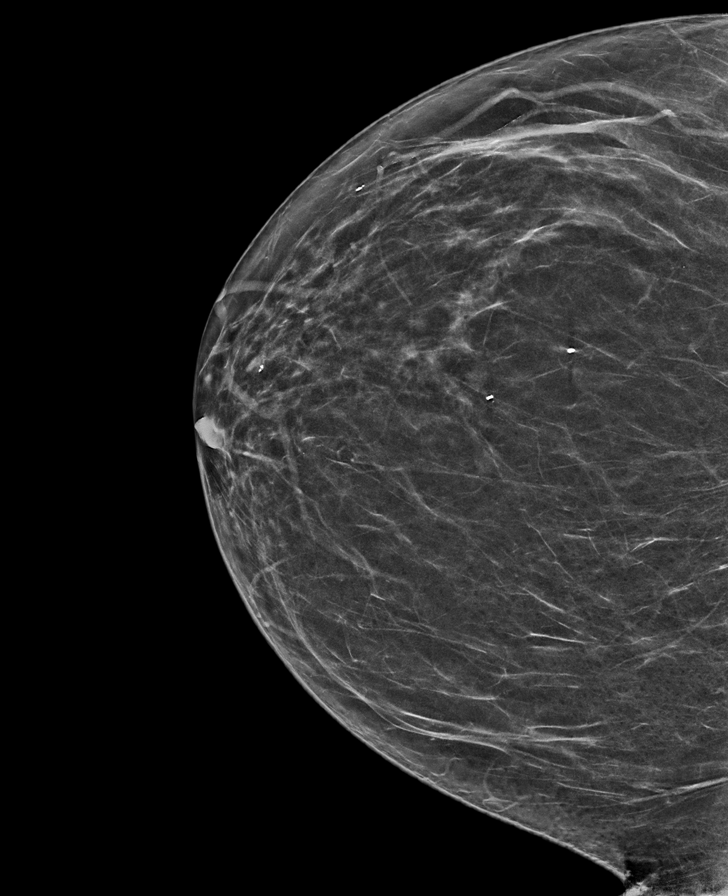

[R MLO synth-2D]
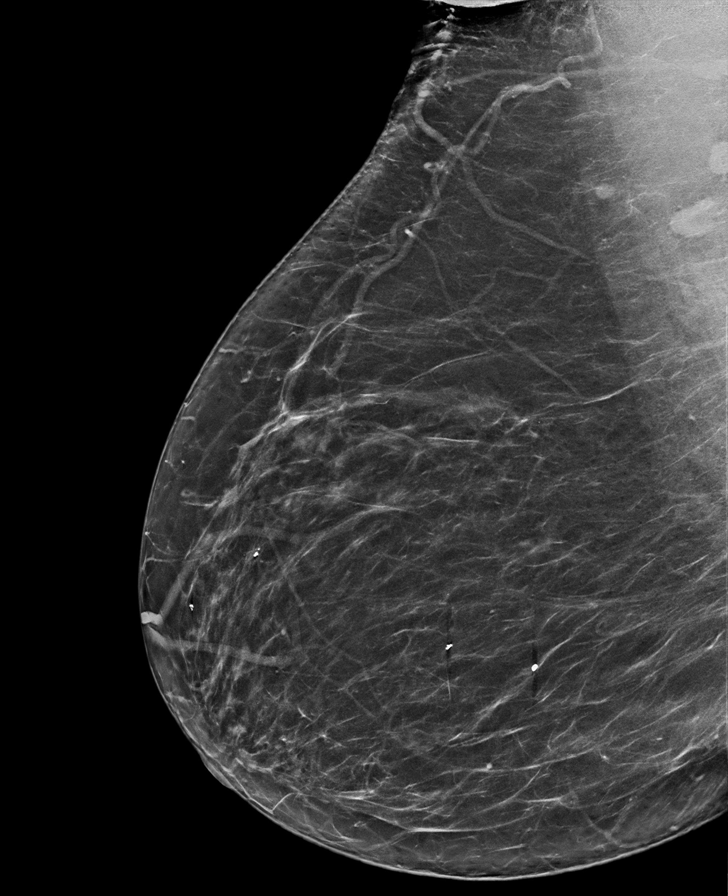

[L CC synth-2D]
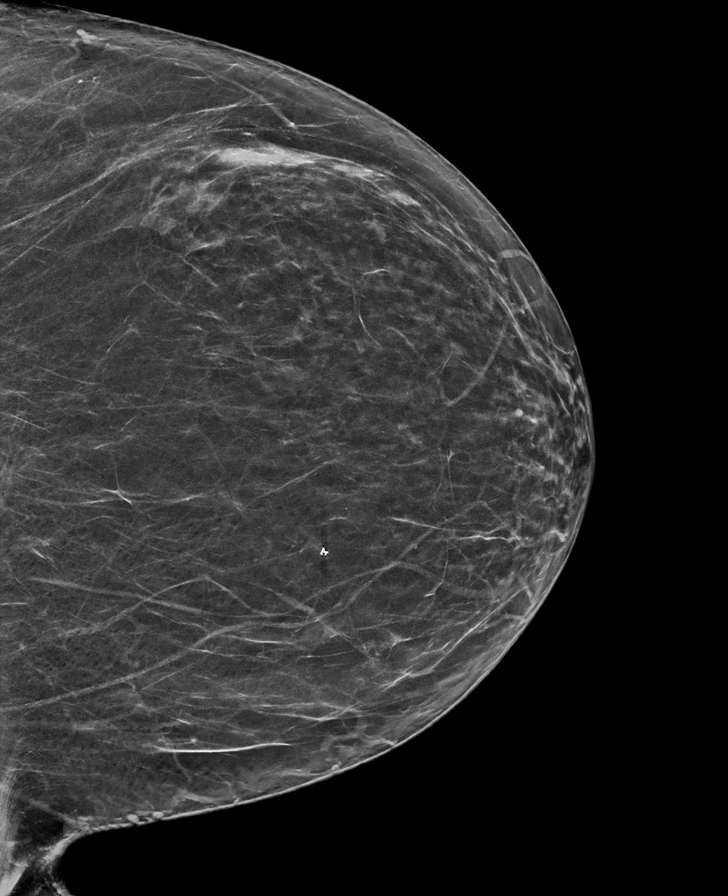

[L MLO synth-2D]
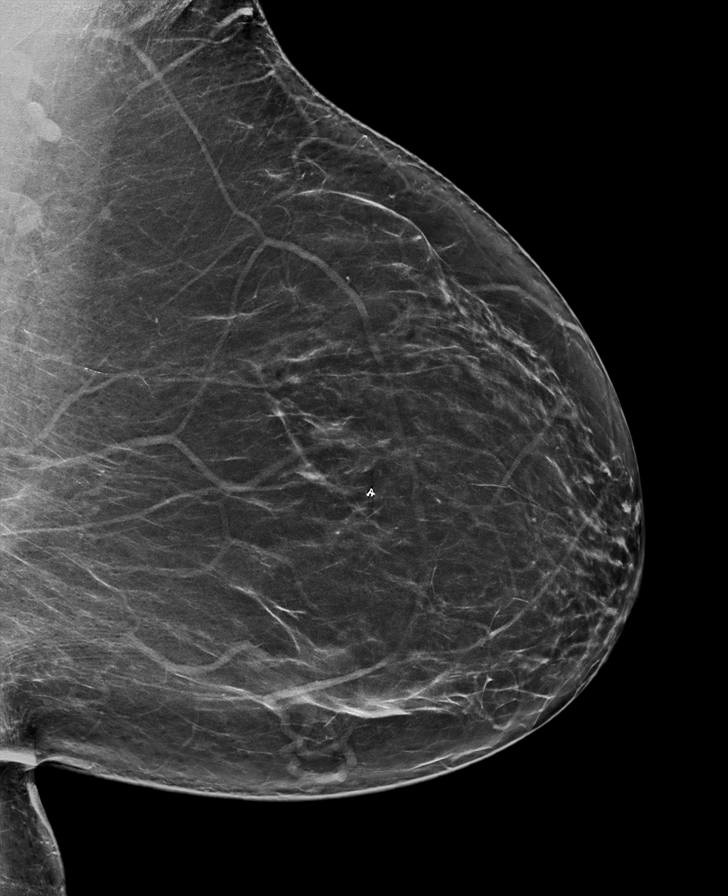

[R MLO tomo · tomo slice 41/81.0]
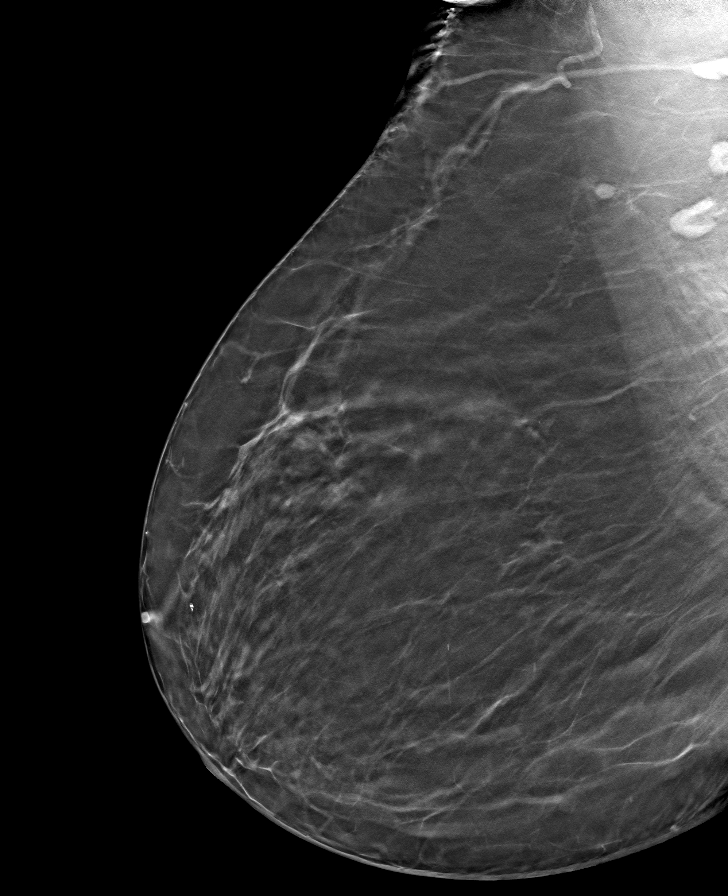

[L CC tomo · tomo slice 34/67.0]
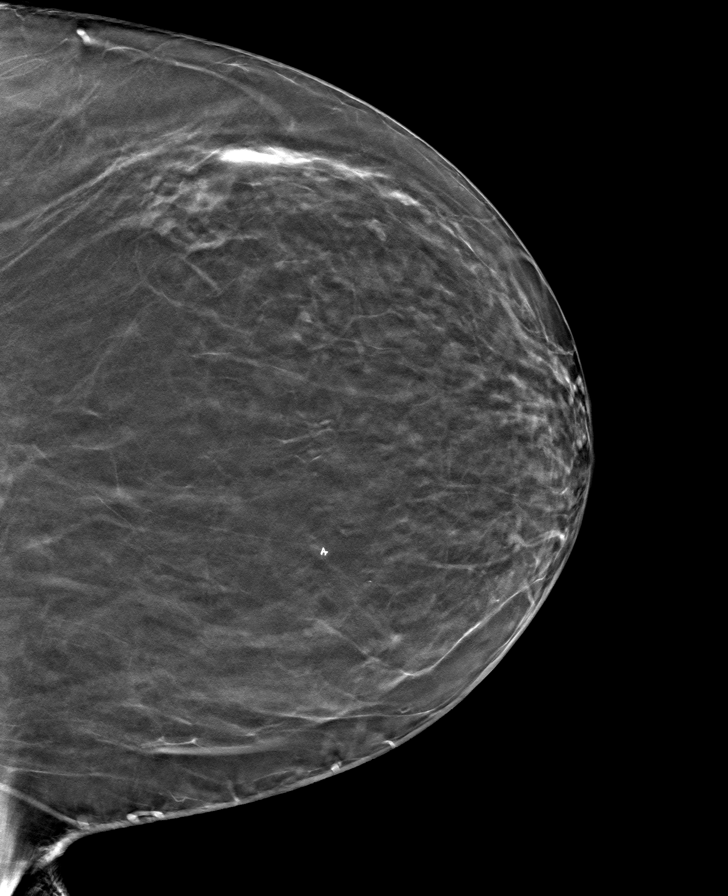

[L MLO tomo · tomo slice 41/81.0]
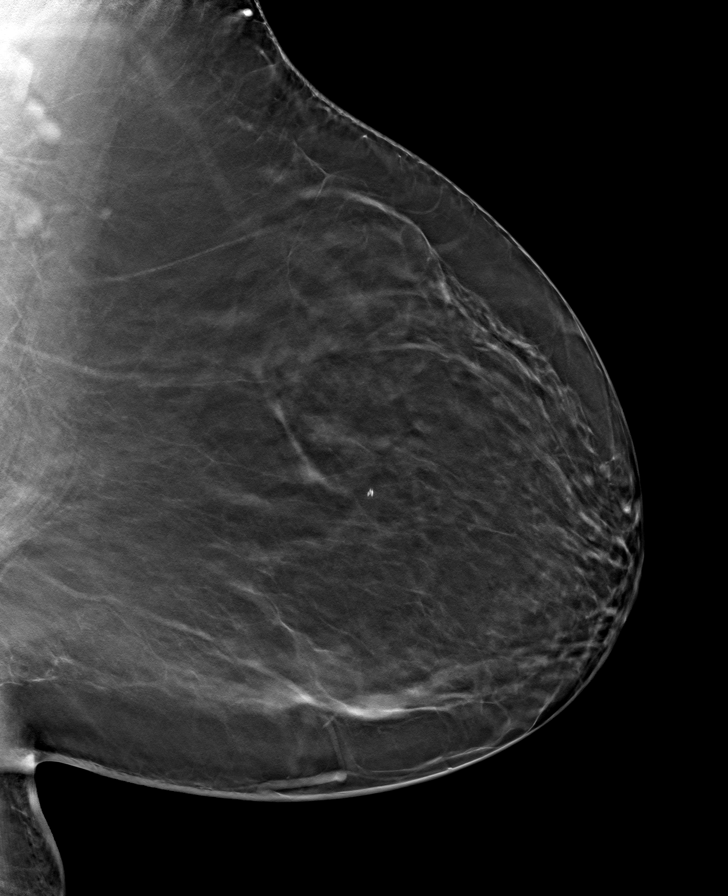

[R CC tomo · tomo slice 35/68.0]
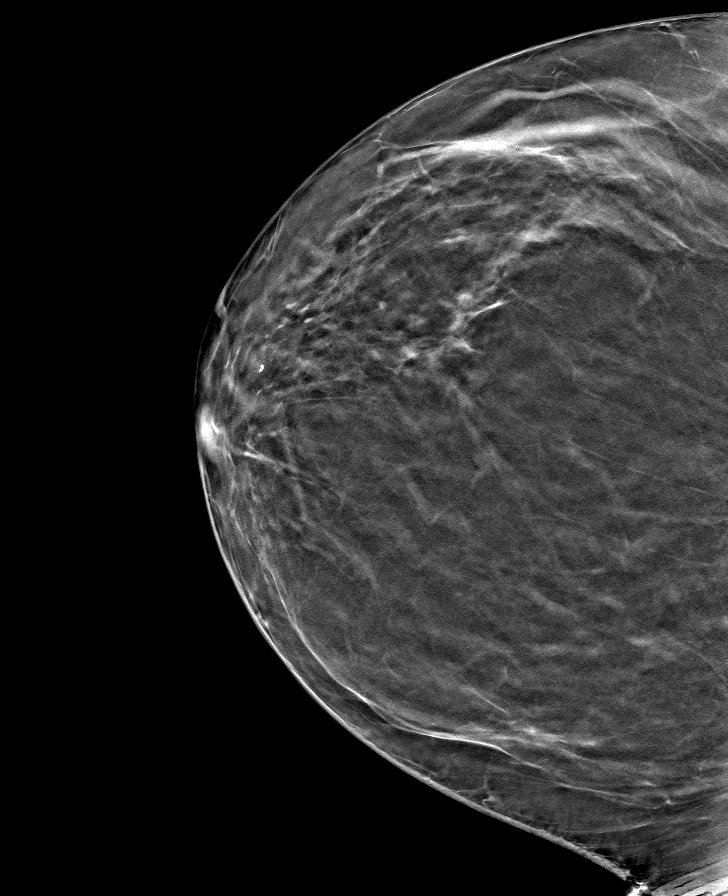

[8 of 24 positions shown; findings below may reference images not displayed]

ACR Breast Density Category b: There are scattered areas of
fibroglandular density.
FINDINGS: There are no findings suspicious for malignancy. Images were
processed with CAD.
IMPRESSION: No mammographic evidence of malignancy. A result letter of this
screening mammogram will be mailed directly to the patient.

RECOMMENDATION:
Screening mammogram in one year. (Code:[TQ])

BI-RADS CATEGORY  1: Negative.

## 2018-09-26 DIAGNOSIS — D649 Anemia, unspecified: Secondary | ICD-10-CM | POA: Insufficient documentation

## 2018-10-05 HISTORY — PX: CATARACT EXTRACTION: SUR2

## 2018-11-09 ENCOUNTER — Ambulatory Visit: Admit: 2018-11-09 | Payer: Medicare Other | Admitting: Unknown Physician Specialty

## 2018-11-09 SURGERY — COLONOSCOPY WITH PROPOFOL
Anesthesia: General

## 2019-05-04 ENCOUNTER — Other Ambulatory Visit: Payer: Self-pay

## 2019-05-04 ENCOUNTER — Ambulatory Visit (INDEPENDENT_AMBULATORY_CARE_PROVIDER_SITE_OTHER): Payer: Medicare Other | Admitting: Nurse Practitioner

## 2019-05-04 ENCOUNTER — Encounter: Payer: Self-pay | Admitting: Nurse Practitioner

## 2019-05-04 VITALS — BP 149/60 | HR 75 | Ht <= 58 in | Wt 187.6 lb

## 2019-05-04 DIAGNOSIS — Z7689 Persons encountering health services in other specified circumstances: Secondary | ICD-10-CM

## 2019-05-04 DIAGNOSIS — F5104 Psychophysiologic insomnia: Secondary | ICD-10-CM | POA: Diagnosis not present

## 2019-05-04 DIAGNOSIS — E782 Mixed hyperlipidemia: Secondary | ICD-10-CM | POA: Diagnosis not present

## 2019-05-04 DIAGNOSIS — F418 Other specified anxiety disorders: Secondary | ICD-10-CM

## 2019-05-04 DIAGNOSIS — I10 Essential (primary) hypertension: Secondary | ICD-10-CM

## 2019-05-04 MED ORDER — SIMVASTATIN 20 MG PO TABS: 20.0000 mg | ORAL_TABLET | Freq: Every day | ORAL | 2 refills | Status: DC

## 2019-05-04 MED ORDER — TRIAMTERENE-HCTZ 37.5-25 MG PO TABS: 1.0000 | ORAL_TABLET | Freq: Every day | ORAL | 2 refills | Status: DC

## 2019-05-04 MED ORDER — FLUOXETINE HCL 20 MG PO TABS: 60.0000 mg | ORAL_TABLET | Freq: Every day | ORAL | 2 refills | Status: DC

## 2019-05-04 MED ORDER — ALPRAZOLAM 0.25 MG PO TABS: 0.2500 mg | ORAL_TABLET | Freq: Two times a day (BID) | ORAL | 0 refills | Status: DC | PRN

## 2019-05-04 NOTE — Progress Notes (Signed)
Subjective:    Patient ID: Chelsea [name], female    DOB: [birth year], 73 y.o.   MRN: [id number]  Chelsea [name] is a 73 y.o. female presenting on 05/04/2019 for [place] (changing provider, need refills on medications )  HPI [place] Provider Pt last seen by PCP Glendon Axe at [facility] about 6 months ago.  Obtain records from [place].  Patient is a current resident at [facility]. She is accompanied today by her daughter or granddaughter.  Anxiety Patient is having increased pain after stopping alprazolam.  She also has noted increasing internal nervousness.  She has been out of medications for over 2 weeks.  Continues to state she needs this medication as she is regularly anxious and is unable to sleep at night now.  Hypertension BP 101 systolic at one of her last visits. Pt denies headache, lightheadedness, dizziness, changes in vision, chest tightness/pressure, palpitations, leg swelling, sudden loss of speech or loss of consciousness.   GAD 7 : Generalized Anxiety Score 05/04/2019  Nervous, Anxious, on Edge 3  Control/stop worrying 3  Worry too much - different things 3  Trouble relaxing 2  Restless 1  Easily annoyed or irritable 1  Afraid - awful might happen 2  Total GAD 7 Score 15  Anxiety Difficulty Somewhat difficult     Depression screen PHQ 2/9 05/04/2019  Decreased Interest 1  Down, Depressed, Hopeless 1  PHQ - 2 Score 2  Altered sleeping 2  Tired, decreased energy 1  Change in appetite 1  Feeling bad or failure about yourself  1  Trouble concentrating 0  Moving slowly or fidgety/restless 0  Suicidal thoughts 0  PHQ-9 Score 7  Difficult doing work/chores Somewhat difficult     Past Medical History:  Diagnosis Date  . Anxiety   . Arthritis   . Depression   . Hyperlipidemia   . Hypertension   . Seizures ([place])   . Spinal stenosis   . Ulcer   . Urinary incontinence    Past Surgical History:    Procedure Laterality Date  . ABDOMINAL HYSTERECTOMY    . BACK SURGERY     due to polio  . BREAST BIOPSY Bilateral    neg  . BREAST BIOPSY Right 2011   neg/stereo  . CARPAL TUNNEL RELEASE    . CATARACT EXTRACTION Right 2020   Social History   Socioeconomic History  . Marital status: Married    Spouse name: Not on file  . Number of children: Not on file  . Years of education: Not on file  . Highest education level: Not on file  Occupational History  . Not on file  Social Needs  . Financial resource strain: Not on file  . Food insecurity    Worry: Not on file    Inability: Not on file  . Transportation needs    Medical: Not on file    Non-medical: Not on file  Tobacco Use  . Smoking status: Never Smoker  . Smokeless tobacco: Never Used  Substance and Sexual Activity  . Alcohol use: No  . Drug use: No  . Sexual activity: Not on file  Lifestyle  . Physical activity    Days per week: Not on file    Minutes per session: Not on file  . Stress: Not on file  Relationships  . Social Herbalist on phone: Not on file    Gets together: Not on file  Attends religious service: Not on file    Active member of club or organization: Not on file    Attends meetings of clubs or organizations: Not on file    Relationship status: Not on file  . Intimate partner violence    Fear of current or ex partner: Not on file    Emotionally abused: Not on file    Physically abused: Not on file    Forced sexual activity: Not on file  Other Topics Concern  . Not on file  Social History Narrative  . Not on file   Family History  Problem Relation Age of Onset  . Arthritis Mother   . Heart disease Mother   . Stroke Mother   . Hypertension Mother   . Sudden death Sister    Current Outpatient Medications on File Prior to Visit  Medication Sig  . aspirin 325 MG tablet Take 325 mg by mouth daily.    . Cyanocobalamin (VITAMIN B 12 PO) Take 500 mcg by mouth daily.  Marland Kitchen ibuprofen  (ADVIL) 200 MG tablet Take 200 mg by mouth every 6 (six) hours as needed.  . vitamin E 400 UNIT capsule Take 400 Units by mouth daily.   No current facility-administered medications on file prior to visit.     Review of Systems  Constitutional: Negative for activity change, appetite change and fatigue.  Respiratory: Negative for cough and shortness of breath.   Cardiovascular: Negative for chest pain, palpitations and leg swelling.  Gastrointestinal: Negative for constipation, diarrhea, nausea and vomiting.  Genitourinary: Negative for dysuria, frequency and urgency.  Musculoskeletal: Positive for arthralgias and back pain. Negative for myalgias.  Skin: Negative for rash.  Neurological: Negative for dizziness and headaches.  Psychiatric/Behavioral: Positive for sleep disturbance. Negative for dysphoric mood.   Per HPI unless specifically indicated above     Objective:    BP (!) 149/60 (BP Location: Left Arm, Patient Position: Sitting, Cuff Size: Normal)   Pulse 75   Ht 4\' 9"  (1.448 m)   Wt 187 lb 9.6 oz (85.1 kg)   BMI 40.60 kg/m   Wt Readings from Last 3 Encounters:  05/04/19 187 lb 9.6 oz (85.1 kg)  02/08/13 189 lb 3.2 oz (85.8 kg)  10/19/12 187 lb 3.2 oz (84.9 kg)    Physical Exam Vitals signs reviewed.  Constitutional:      General: She is not in acute distress.    Appearance: She is well-developed. She is obese.  HENT:     Head: Normocephalic and atraumatic.  Cardiovascular:     Rate and Rhythm: Normal rate and regular rhythm.     Pulses:          Radial pulses are 2+ on the right side and 2+ on the left side.       Posterior tibial pulses are 1+ on the right side and 1+ on the left side.     Heart sounds: Normal heart sounds, S1 normal and S2 normal.  Pulmonary:     Effort: Pulmonary effort is normal. No respiratory distress.     Breath sounds: Normal breath sounds and air entry.  Musculoskeletal:     Right lower leg: No edema.     Left lower leg: No edema.    Skin:    General: Skin is warm and dry.     Capillary Refill: Capillary refill takes less than 2 seconds.  Neurological:     Mental Status: She is alert and oriented to person, place, and time.  Mental status is at baseline.  Psychiatric:        Attention and Perception: Attention normal.        Mood and Affect: Affect normal. Mood is anxious.        Behavior: Behavior normal. Behavior is cooperative.        Thought Content: Thought content normal.        Judgment: Judgment normal.      Results for orders placed or performed in visit on 02/08/13  CBC with Differential  Result Value Ref Range   WBC 5.7 4.5 - 10.5 K/uL   RBC 4.01 3.87 - 5.11 Mil/uL   Hemoglobin 11.8 (L) 12.0 - 15.0 g/dL   HCT 35.5 (L) 36.0 - 46.0 %   MCV 88.5 78.0 - 100.0 fl   MCHC 33.2 30.0 - 36.0 g/dL   RDW 14.4 11.5 - 14.6 %   Platelets 211.0 150.0 - 400.0 K/uL   Neutrophils Relative % 41.6 (L) 43.0 - 77.0 %   Lymphocytes Relative 45.3 12.0 - 46.0 %   Monocytes Relative 9.9 3.0 - 12.0 %   Eosinophils Relative 2.6 0.0 - 5.0 %   Basophils Relative 0.6 0.0 - 3.0 %   Neutro Abs 2.4 1.4 - 7.7 K/uL   Lymphs Abs 2.6 0.7 - 4.0 K/uL   Monocytes Absolute 0.6 0.1 - 1.0 K/uL   Eosinophils Absolute 0.1 0.0 - 0.7 K/uL   Basophils Absolute 0.0 0.0 - 0.1 K/uL  Basic metabolic panel  Result Value Ref Range   Sodium 140 135 - 145 mEq/L   Potassium 3.5 3.5 - 5.1 mEq/L   Chloride 105 96 - 112 mEq/L   CO2 27 19 - 32 mEq/L   Glucose, Bld 88 70 - 99 mg/dL   BUN 14 6 - 23 mg/dL   Creatinine, Ser 1.0 0.4 - 1.2 mg/dL   Calcium 8.9 8.4 - 10.5 mg/dL   GFR 70.97 >60.00 mL/min  Hepatic function panel  Result Value Ref Range   Total Bilirubin 0.5 0.3 - 1.2 mg/dL   Bilirubin, Direct 0.0 0.0 - 0.3 mg/dL   Alkaline Phosphatase 61 39 - 117 U/L   AST 21 0 - 37 U/L   ALT 18 0 - 35 U/L   Total Protein 7.0 6.0 - 8.3 g/dL   Albumin 3.6 3.5 - 5.2 g/dL  TSH  Result Value Ref Range   TSH 1.59 0.35 - 5.50 uIU/mL  Lipid panel  Result  Value Ref Range   Cholesterol 227 (H) 0 - 200 mg/dL   Triglycerides 92.0 0.0 - 149.0 mg/dL   HDL 53.20 >39.00 mg/dL   VLDL 18.4 0.0 - 40.0 mg/dL   Total CHOL/HDL Ratio 4   LDL cholesterol, direct  Result Value Ref Range   Direct LDL 151.3 mg/dL      Assessment & Plan:   Problem List Items Addressed This Visit      Cardiovascular and Mediastinum   Essential hypertension Elevated in clinic today.  Usually controlled at home.  Refill medications.  Needs labs.  Ordered for today.  Follow-up 4 weeks.   Relevant Medications   simvastatin (ZOCOR) 20 MG tablet   triamterene-hydrochlorothiazide (MAXZIDE-25) 37.5-25 MG tablet     Other   Hyperlipidemia Status unknown.  Recheck labs.  Continue meds without changes today.  Refills provided. Followup 4 weeks and prn after labs.    Relevant Medications   simvastatin (ZOCOR) 20 MG tablet   triamterene-hydrochlorothiazide (MAXZIDE-25) 37.5-25 MG tablet   Other Relevant Orders  COMPLETE METABOLIC PANEL WITH GFR   Lipid panel   Depression with anxiety - Primary Remains suboptimally controlled.  Patient on fluoxetine 60 mg once daily tolerates well.  No withdrawal from alprazolam has occurred, however patient's sleep has been heavily disrupted since stopping benzo.  Plan: 1. Resume alprazolam 0.25 mg one tablet twice daily prn.  Use only in daytime prn anxiety and at bedtime.  Goal is future use of alprazolam only at bedtime.  Patient and family verbalize understanding. - PDMP reviewed and fills are consistent with patient reports - Controlled substance contract reviewed and signed 2. Continue fluoxetine.  May need to consider alternative med for sleep in future. 3. Continue non-medicine options for managing anxiety 4. Follow-up 4 weeks.   Relevant Medications   ALPRAZolam (XANAX) 0.25 MG tablet   FLUoxetine (PROZAC) 20 MG tablet    Other Visit Diagnoses    Psychophysiological insomnia      See anxiety depression above   Relevant  Medications   ALPRAZolam (XANAX) 0.25 MG tablet   FLUoxetine (PROZAC) 20 MG tablet   Encounter to establish care Previous PCP was at [facility].  Records are reviewed in [place] today.  Past medical, family, and surgical history reviewed w/ pt.            Meds ordered this encounter  Medications  . ALPRAZolam (XANAX) 0.25 MG tablet    Sig: Take 1 tablet (0.25 mg total) by mouth 2 (two) times daily as needed for anxiety or sleep.    Dispense:  60 tablet    Refill:  0    Order Specific Question:   Supervising Provider    Answer:   Olin Hauser [[id number]]  . FLUoxetine (PROZAC) 20 MG tablet    Sig: Take 3 tablets (60 mg total) by mouth daily. Three capsule once daily.    Dispense:  90 tablet    Refill:  2    Order Specific Question:   Supervising Provider    Answer:   Olin Hauser [[id number]]  . simvastatin (ZOCOR) 20 MG tablet    Sig: Take 1 tablet (20 mg total) by mouth daily.    Dispense:  30 tablet    Refill:  2    Order Specific Question:   Supervising Provider    Answer:   Olin Hauser [[id number]]  . triamterene-hydrochlorothiazide (MAXZIDE-25) 37.5-25 MG tablet    Sig: Take 1 tablet by mouth daily.    Dispense:  60 tablet    Refill:  2    Order Specific Question:   Supervising Provider    Answer:   Olin Hauser [[id number]]     Follow up plan: Return in about 4 weeks (around 06/01/2019) for hypertension and anxiety - can be remote.  Labs in next 7 days.Cassell Smiles, DNP, AGPCNP-BC Adult Gerontology Primary Care Nurse Practitioner [facility] 05/04/2019, 11:01 AM

## 2019-05-04 NOTE — Patient Instructions (Addendum)
Chelsea [name],   Thank you for coming in to clinic today.  1. Continue most medications without changes.  The only medication we will change is your alprazolam.  Take alprazolam 0.25 mg one tablet twice daily as needed for anxiety or sleep.  Try to skip your daytime dose several times per week.  I would like to get you on the safest dose of this medication, which will be only at bedtime in the next several months or completely quit at some time in the next several years.  2. You will be due for FASTING BLOOD WORK.  This means you should eat no food or drink after midnight.  Drink only water or coffee without cream/sugar on the morning of your lab visit. - Please go ahead and schedule a "Lab Only" visit in the morning at the clinic for lab draw in the next 7 days. - Your results will be available about 2-3 days after blood draw.  If you have set up a MyChart account, you can can log in to MyChart online to view your results and a brief explanation. Also, we can discuss your results together at your next office visit if you would like.   Please schedule a follow-up appointment with Cassell Smiles, AGNP. Return in about 4 weeks (around 06/01/2019) for hypertension and anxiety - can be remote.  Labs in next 7 days..  If you have any other questions or concerns, please feel free to call the clinic or send a message through [place]. You may also schedule an earlier appointment if necessary.  You will receive a survey after today's visit either digitally by e-mail or paper by [employer]. Your experiences and feedback matter to [place].  Please respond so we know how we are doing as we provide care for you.   Cassell Smiles, DNP, AGNP-BC Adult Gerontology Nurse Practitioner [place]

## 2019-05-05 ENCOUNTER — Encounter: Payer: Self-pay | Admitting: Nurse Practitioner

## 2019-05-06 ENCOUNTER — Other Ambulatory Visit: Payer: Self-pay | Admitting: Nurse Practitioner

## 2019-05-06 DIAGNOSIS — F5104 Psychophysiologic insomnia: Secondary | ICD-10-CM

## 2019-05-06 DIAGNOSIS — F418 Other specified anxiety disorders: Secondary | ICD-10-CM

## 2019-05-11 ENCOUNTER — Other Ambulatory Visit: Payer: Medicare Other

## 2019-05-11 ENCOUNTER — Other Ambulatory Visit: Payer: Self-pay

## 2019-05-12 LAB — COMPLETE METABOLIC PANEL WITH GFR
AG Ratio: 1.3 (calc) (ref 1.0–2.5)
ALT: 25 U/L (ref 6–29)
AST: 25 U/L (ref 10–35)
Albumin: 3.7 g/dL (ref 3.6–5.1)
Alkaline phosphatase (APISO): 66 U/L (ref 37–153)
BUN: 15 mg/dL (ref 7–25)
CO2: 32 mmol/L (ref 20–32)
Calcium: 9.3 mg/dL (ref 8.6–10.4)
Chloride: 103 mmol/L (ref 98–110)
Creat: 0.9 mg/dL (ref 0.60–0.93)
GFR, Est African American: 74 mL/min/{1.73_m2} (ref >=60)
GFR, Est Non African American: 63 mL/min/{1.73_m2} (ref >=60)
Globulin: 2.9 g/dL (calc) (ref 1.9–3.7)
Glucose, Bld: 81 mg/dL (ref 65–99)
Potassium: 3.7 mmol/L (ref 3.5–5.3)
Sodium: 142 mmol/L (ref 135–146)
Total Bilirubin: 0.4 mg/dL (ref 0.2–1.2)
Total Protein: 6.6 g/dL (ref 6.1–8.1)

## 2019-05-12 LAB — LIPID PANEL
Cholesterol: 193 mg/dL (ref <200)
HDL: 57 mg/dL (ref >=50)
LDL Cholesterol (Calc): 121 mg/dL (calc) — ABNORMAL HIGH
Non-HDL Cholesterol (Calc): 136 mg/dL (calc) — ABNORMAL HIGH (ref <130)
Total CHOL/HDL Ratio: 3.4 (calc) (ref <5.0)
Triglycerides: 59 mg/dL (ref <150)

## 2019-05-30 ENCOUNTER — Other Ambulatory Visit: Payer: Self-pay

## 2019-05-30 DIAGNOSIS — E782 Mixed hyperlipidemia: Secondary | ICD-10-CM

## 2019-05-31 MED ORDER — SIMVASTATIN 20 MG PO TABS: 20.0000 mg | ORAL_TABLET | Freq: Every day | ORAL | 1 refills | Status: DC

## 2019-06-01 ENCOUNTER — Other Ambulatory Visit: Payer: Self-pay

## 2019-06-01 ENCOUNTER — Encounter: Payer: Self-pay | Admitting: Nurse Practitioner

## 2019-06-01 ENCOUNTER — Ambulatory Visit (INDEPENDENT_AMBULATORY_CARE_PROVIDER_SITE_OTHER): Payer: Medicare Other | Admitting: Nurse Practitioner

## 2019-06-01 DIAGNOSIS — I10 Essential (primary) hypertension: Secondary | ICD-10-CM | POA: Diagnosis not present

## 2019-06-01 DIAGNOSIS — E782 Mixed hyperlipidemia: Secondary | ICD-10-CM

## 2019-06-01 DIAGNOSIS — F418 Other specified anxiety disorders: Secondary | ICD-10-CM | POA: Diagnosis not present

## 2019-06-01 DIAGNOSIS — F5104 Psychophysiologic insomnia: Secondary | ICD-10-CM

## 2019-06-01 MED ORDER — ALPRAZOLAM 0.25 MG PO TABS: 0.2500 mg | ORAL_TABLET | Freq: Two times a day (BID) | ORAL | 3 refills | Status: DC | PRN

## 2019-06-01 MED ORDER — SIMVASTATIN 40 MG PO TABS: 40.0000 mg | ORAL_TABLET | Freq: Every day | ORAL | 1 refills | Status: DC

## 2019-06-01 MED ORDER — TRIAMTERENE-HCTZ 37.5-25 MG PO TABS: 1.0000 | ORAL_TABLET | Freq: Every day | ORAL | 2 refills | Status: DC

## 2019-06-01 NOTE — Patient Instructions (Addendum)
Chelsea [name],   Thank you for coming in to clinic today.  1. Eat protein source at least twice a day (eggs, meat, fish).  2. Okay to use slim fast if you want to.  You do not have to use this.  3. Continue reducing alprazolam (Xanax) for your daytime dose as you are able to.  Go slowly with cutting back one to two days per week.  Continue taking every night at bedtime.  Please schedule a follow-up appointment with Cassell Smiles, AGNP. Return in about 3 months (around 09/01/2019) for hypertension, anxiety.  If you have any other questions or concerns, please feel free to call the clinic or send a message through [place]. You may also schedule an earlier appointment if necessary.  You will receive a survey after today's visit either digitally by e-mail or paper by [employer]. Your experiences and feedback matter to [place].  Please respond so we know how we are doing as we provide care for you.   Cassell Smiles, DNP, AGNP-BC Adult Gerontology Nurse Practitioner [place]

## 2019-06-01 NOTE — Progress Notes (Signed)
Subjective:    Patient ID: Chelsea [name], female    DOB: 1946/05/17, 73 y.o.   MRN: [id number]  Chelsea [name] is a 73 y.o. female presenting on 06/01/2019 for Hypertension and Anxiety   HPI Hypertension - She is checking BP at home or outside of clinic.    - Current medications: triamterene-hctz 37.5-25 mg once daily, tolerating well without side effects - She is not currently symptomatic. - Pt denies headache, lightheadedness, dizziness, changes in vision, chest tightness/pressure, palpitations, leg swelling, sudden loss of speech or loss of consciousness. - She  reports no regular exercise routine. - Her diet is moderate in salt, moderate in fat, and moderate in carbohydrates.   Weight Patient would like to lose weight and is considering slim fast.  Wants to know about safety.  Anxiety Patient was reduced from alprazolam 0.25 mg bid to prn in am and at bedtime.  Patient was able to reduce to once per day for two doses over the last month.  Reduced from 0.5 mg tid.  Patient watches TV at night.  Has only occasionally had trouble sleep.   Had just had multiple deaths through 2016-2017 with losing her mother, husband. Mentally wasn't able to judge her life.  Patient has one child and one grandchild here.  Had much more interaction in [place] area.  Moved here about 1 year ago.  Continued fluoxetine 60 mg daily.  Has had some problems with sleep. Not unhappy with sleep quality, however.   GAD 7 : Generalized Anxiety Score 06/01/2019 05/04/2019  Nervous, Anxious, on Edge 1 3  Control/stop worrying 0 3  Worry too much - different things 1 3  Trouble relaxing 0 2  Restless 0 1  Easily annoyed or irritable 0 1  Afraid - awful might happen 3 2  Total GAD 7 Score 5 15  Anxiety Difficulty Not difficult at all Somewhat difficult     Depression screen [facility] 2/9 06/01/2019 05/04/2019  Decreased Interest 0 1  Down, Depressed, Hopeless 3 1  PHQ - 2 Score 3 2  Altered sleeping 1 2    Tired, decreased energy 0 1  Change in appetite 3 1  Feeling bad or failure about yourself  0 1  Trouble concentrating 1 0  Moving slowly or fidgety/restless 1 0  Suicidal thoughts 0 0  PHQ-9 Score 9 7  Difficult doing work/chores Not difficult at all Somewhat difficult    Social History   Tobacco Use  . Smoking status: Never Smoker  . Smokeless tobacco: Never Used  Substance Use Topics  . Alcohol use: No  . Drug use: No    Review of Systems Per HPI unless specifically indicated above     Objective:    BP 129/62 (BP Location: Left Arm, Patient Position: Sitting, Cuff Size: Normal)   Pulse 76   Temp 98.2 F (36.8 C) (Oral)   Ht 4\' 9"  (1.448 m)   Wt 189 lb 3.2 oz (85.8 kg)   SpO2 97%   BMI 40.94 kg/m   Wt Readings from Last 3 Encounters:  06/01/19 189 lb 3.2 oz (85.8 kg)  05/04/19 187 lb 9.6 oz (85.1 kg)  02/08/13 189 lb 3.2 oz (85.8 kg)    Physical Exam Vitals signs reviewed.  Constitutional:      General: She is awake. She is not in acute distress.    Appearance: She is well-developed. She is obese.  HENT:     Head: Normocephalic and atraumatic.  Neck:  Musculoskeletal: Normal range of motion and neck supple.     Vascular: No carotid bruit.  Cardiovascular:     Rate and Rhythm: Normal rate and regular rhythm.     Pulses:          Radial pulses are 2+ on the right side and 2+ on the left side.       Posterior tibial pulses are 1+ on the right side and 1+ on the left side.     Heart sounds: Normal heart sounds, S1 normal and S2 normal.  Pulmonary:     Effort: Pulmonary effort is normal. No respiratory distress.     Breath sounds: Normal breath sounds and air entry.  Skin:    General: Skin is warm and dry.  Neurological:     Mental Status: She is alert and oriented to person, place, and time.  Psychiatric:        Attention and Perception: Attention normal.        Mood and Affect: Affect normal. Mood is anxious.        Speech: Speech is tangential.         Behavior: Behavior normal. Behavior is cooperative.        Cognition and Memory: She exhibits impaired recent memory (mild, but affects pt's recall of timing/chain of events).    Results for orders placed or performed in visit on 05/04/19  COMPLETE METABOLIC PANEL WITH GFR  Result Value Ref Range   Glucose, Bld 81 65 - 99 mg/dL   BUN 15 7 - 25 mg/dL   Creat 0.90 0.60 - 0.93 mg/dL   GFR, Est Non African American 63 > OR = 60 mL/min/1.31m2   GFR, Est African American 74 > OR = 60 mL/min/1.41m2   BUN/Creatinine Ratio NOT APPLICABLE 6 - 22 (calc)   Sodium 142 135 - 146 mmol/L   Potassium 3.7 3.5 - 5.3 mmol/L   Chloride 103 98 - 110 mmol/L   CO2 32 20 - 32 mmol/L   Calcium 9.3 8.6 - 10.4 mg/dL   Total Protein 6.6 6.1 - 8.1 g/dL   Albumin 3.7 3.6 - 5.1 g/dL   Globulin 2.9 1.9 - 3.7 g/dL (calc)   AG Ratio 1.3 1.0 - 2.5 (calc)   Total Bilirubin 0.4 0.2 - 1.2 mg/dL   Alkaline phosphatase (APISO) 66 37 - 153 U/L   AST 25 10 - 35 U/L   ALT 25 6 - 29 U/L  Lipid panel  Result Value Ref Range   Cholesterol 193 <200 mg/dL   HDL 57 > OR = 50 mg/dL   Triglycerides 59 <150 mg/dL   LDL Cholesterol (Calc) 121 (H) mg/dL (calc)   Total CHOL/HDL Ratio 3.4 <5.0 (calc)   Non-HDL Cholesterol (Calc) 136 (H) <130 mg/dL (calc)      Assessment & Plan:   Problem List Items Addressed This Visit      Cardiovascular and Mediastinum   Essential hypertension Stable, controlled hypertension.  BP goal < 130/80.  Pt is working on lifestyle modifications.  Taking medications tolerating well without side effects. No current complications.  Plan: 1. Continue taking meds without changes - Safe to use slim fast for one meal - encouraged adequate protein intake. 2. Obtain labs   3. Encouraged heart healthy diet and increasing exercise to 30 minutes most days of the week. 4. Check BP 1-2 x per week at home, keep log, and bring to clinic at next appointment. 5. Follow up 3 months.  Relevant Medications    simvastatin (ZOCOR) 40 MG tablet   triamterene-hydrochlorothiazide (MAXZIDE-25) 37.5-25 MG tablet     Other   Hyperlipidemia Stable today on exam.  Medications tolerated without side effects.  Continue at current doses.  Refills provided.  Check labs today. Followup 6 months.    Relevant Medications   simvastatin (ZOCOR) 40 MG tablet   triamterene-hydrochlorothiazide (MAXZIDE-25) 37.5-25 MG tablet   Depression with anxiety Currently stable.  Improved with reduced alprazolam per patient report.  Continue dose reduction.  Patient verbalizes reduction plan.  Refills provided.  Follow-up 3 mos   Relevant Medications   ALPRAZolam (XANAX) 0.25 MG tablet    Other Visit Diagnoses    Psychophysiological insomnia     Continues to improve.  Reviewed using sleep hygiene.  Continue alprazolam every night.  Reduce only am dose for now.  Patient verbalizes understanding.   Relevant Medications   ALPRAZolam (XANAX) 0.25 MG tablet      Meds ordered this encounter  Medications  . ALPRAZolam (XANAX) 0.25 MG tablet    Sig: Take 1 tablet (0.25 mg total) by mouth 2 (two) times daily as needed for anxiety or sleep.    Dispense:  60 tablet    Refill:  3    Order Specific Question:   Supervising Provider    Answer:   Olin Hauser [[id number]]  . simvastatin (ZOCOR) 40 MG tablet    Sig: Take 1 tablet (40 mg total) by mouth daily.    Dispense:  90 tablet    Refill:  1    Order Specific Question:   Supervising Provider    Answer:   Olin Hauser [[id number]]  . triamterene-hydrochlorothiazide (MAXZIDE-25) 37.5-25 MG tablet    Sig: Take 1 tablet by mouth daily.    Dispense:  90 tablet    Refill:  2    Order Specific Question:   Supervising Provider    Answer:   Olin Hauser [[id number]]    Follow up plan: Return in about 3 months (around 09/01/2019) for hypertension, anxiety.  Cassell Smiles, DNP, AGPCNP-BC Adult Gerontology Primary Care Nurse Practitioner [facility] 06/01/2019, 10:22 AM

## 2019-06-05 ENCOUNTER — Encounter: Payer: Self-pay | Admitting: Nurse Practitioner

## 2019-07-08 ENCOUNTER — Emergency Department: Payer: No Typology Code available for payment source

## 2019-07-08 ENCOUNTER — Emergency Department
Admission: EM | Admit: 2019-07-08 | Discharge: 2019-07-08 | Disposition: A | Discharge status: Home / Self Care | Payer: No Typology Code available for payment source | Attending: Emergency Medicine | Admitting: Emergency Medicine

## 2019-07-08 ENCOUNTER — Other Ambulatory Visit: Payer: Self-pay

## 2019-07-08 DIAGNOSIS — Y93I9 Activity, other involving external motion: Secondary | ICD-10-CM | POA: Diagnosis not present

## 2019-07-08 DIAGNOSIS — M25511 Pain in right shoulder: Secondary | ICD-10-CM | POA: Diagnosis not present

## 2019-07-08 DIAGNOSIS — M542 Cervicalgia: Secondary | ICD-10-CM | POA: Insufficient documentation

## 2019-07-08 DIAGNOSIS — Y92410 Unspecified street and highway as the place of occurrence of the external cause: Secondary | ICD-10-CM | POA: Insufficient documentation

## 2019-07-08 DIAGNOSIS — Z7982 Long term (current) use of aspirin: Secondary | ICD-10-CM | POA: Diagnosis not present

## 2019-07-08 DIAGNOSIS — R519 Headache, unspecified: Secondary | ICD-10-CM | POA: Diagnosis present

## 2019-07-08 DIAGNOSIS — Z79899 Other long term (current) drug therapy: Secondary | ICD-10-CM | POA: Insufficient documentation

## 2019-07-08 DIAGNOSIS — I10 Essential (primary) hypertension: Secondary | ICD-10-CM | POA: Diagnosis not present

## 2019-07-08 DIAGNOSIS — Y999 Unspecified external cause status: Secondary | ICD-10-CM | POA: Insufficient documentation

## 2019-07-08 LAB — CBC WITH DIFFERENTIAL/PLATELET
Abs Immature Granulocytes: 0.01 10*3/uL (ref 0.00–0.07)
Basophils Absolute: 0 10*3/uL (ref 0.0–0.1)
Basophils Relative: 0 %
Eosinophils Absolute: 0.1 10*3/uL (ref 0.0–0.5)
Eosinophils Relative: 2 %
HCT: 36.1 % (ref 36.0–46.0)
Hemoglobin: 11.4 g/dL — ABNORMAL LOW (ref 12.0–15.0)
Immature Granulocytes: 0 %
Lymphocytes Relative: 33 %
Lymphs Abs: 1.7 10*3/uL (ref 0.7–4.0)
MCH: 29 pg (ref 26.0–34.0)
MCHC: 31.6 g/dL (ref 30.0–36.0)
MCV: 91.9 fL (ref 80.0–100.0)
Monocytes Absolute: 0.6 10*3/uL (ref 0.1–1.0)
Monocytes Relative: 12 %
Neutro Abs: 2.6 10*3/uL (ref 1.7–7.7)
Neutrophils Relative %: 53 %
Platelets: 205 10*3/uL (ref 150–400)
RBC: 3.93 MIL/uL (ref 3.87–5.11)
RDW: 13.4 % (ref 11.5–15.5)
WBC: 5 10*3/uL (ref 4.0–10.5)
nRBC: 0 % (ref 0.0–0.2)

## 2019-07-08 LAB — COMPREHENSIVE METABOLIC PANEL
ALT: 37 U/L (ref 0–44)
AST: 28 U/L (ref 15–41)
Albumin: 3.4 g/dL — ABNORMAL LOW (ref 3.5–5.0)
Alkaline Phosphatase: 70 U/L (ref 38–126)
Anion gap: 8 (ref 5–15)
BUN: 16 mg/dL (ref 8–23)
CO2: 30 mmol/L (ref 22–32)
Calcium: 9.3 mg/dL (ref 8.9–10.3)
Chloride: 100 mmol/L (ref 98–111)
Creatinine, Ser: 0.95 mg/dL (ref 0.44–1.00)
GFR calc Af Amer: >60 mL/min (ref >60)
GFR calc non Af Amer: 59 mL/min — ABNORMAL LOW (ref >60)
Glucose, Bld: 93 mg/dL (ref 70–99)
Potassium: 3.6 mmol/L (ref 3.5–5.1)
Sodium: 138 mmol/L (ref 135–145)
Total Bilirubin: 0.6 mg/dL (ref 0.3–1.2)
Total Protein: 7.2 g/dL (ref 6.5–8.1)
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
## 2019-07-08 IMAGING — CR DG SHOULDER 2+V*R*
3 series · 3 of 3 positions shown · non-contrast
Comparison: None.

CLINICAL DATA: Fall with right shoulder pain.

EXAM:
RIGHT SHOULDER - 2+ VIEW

[shoulder grashey]
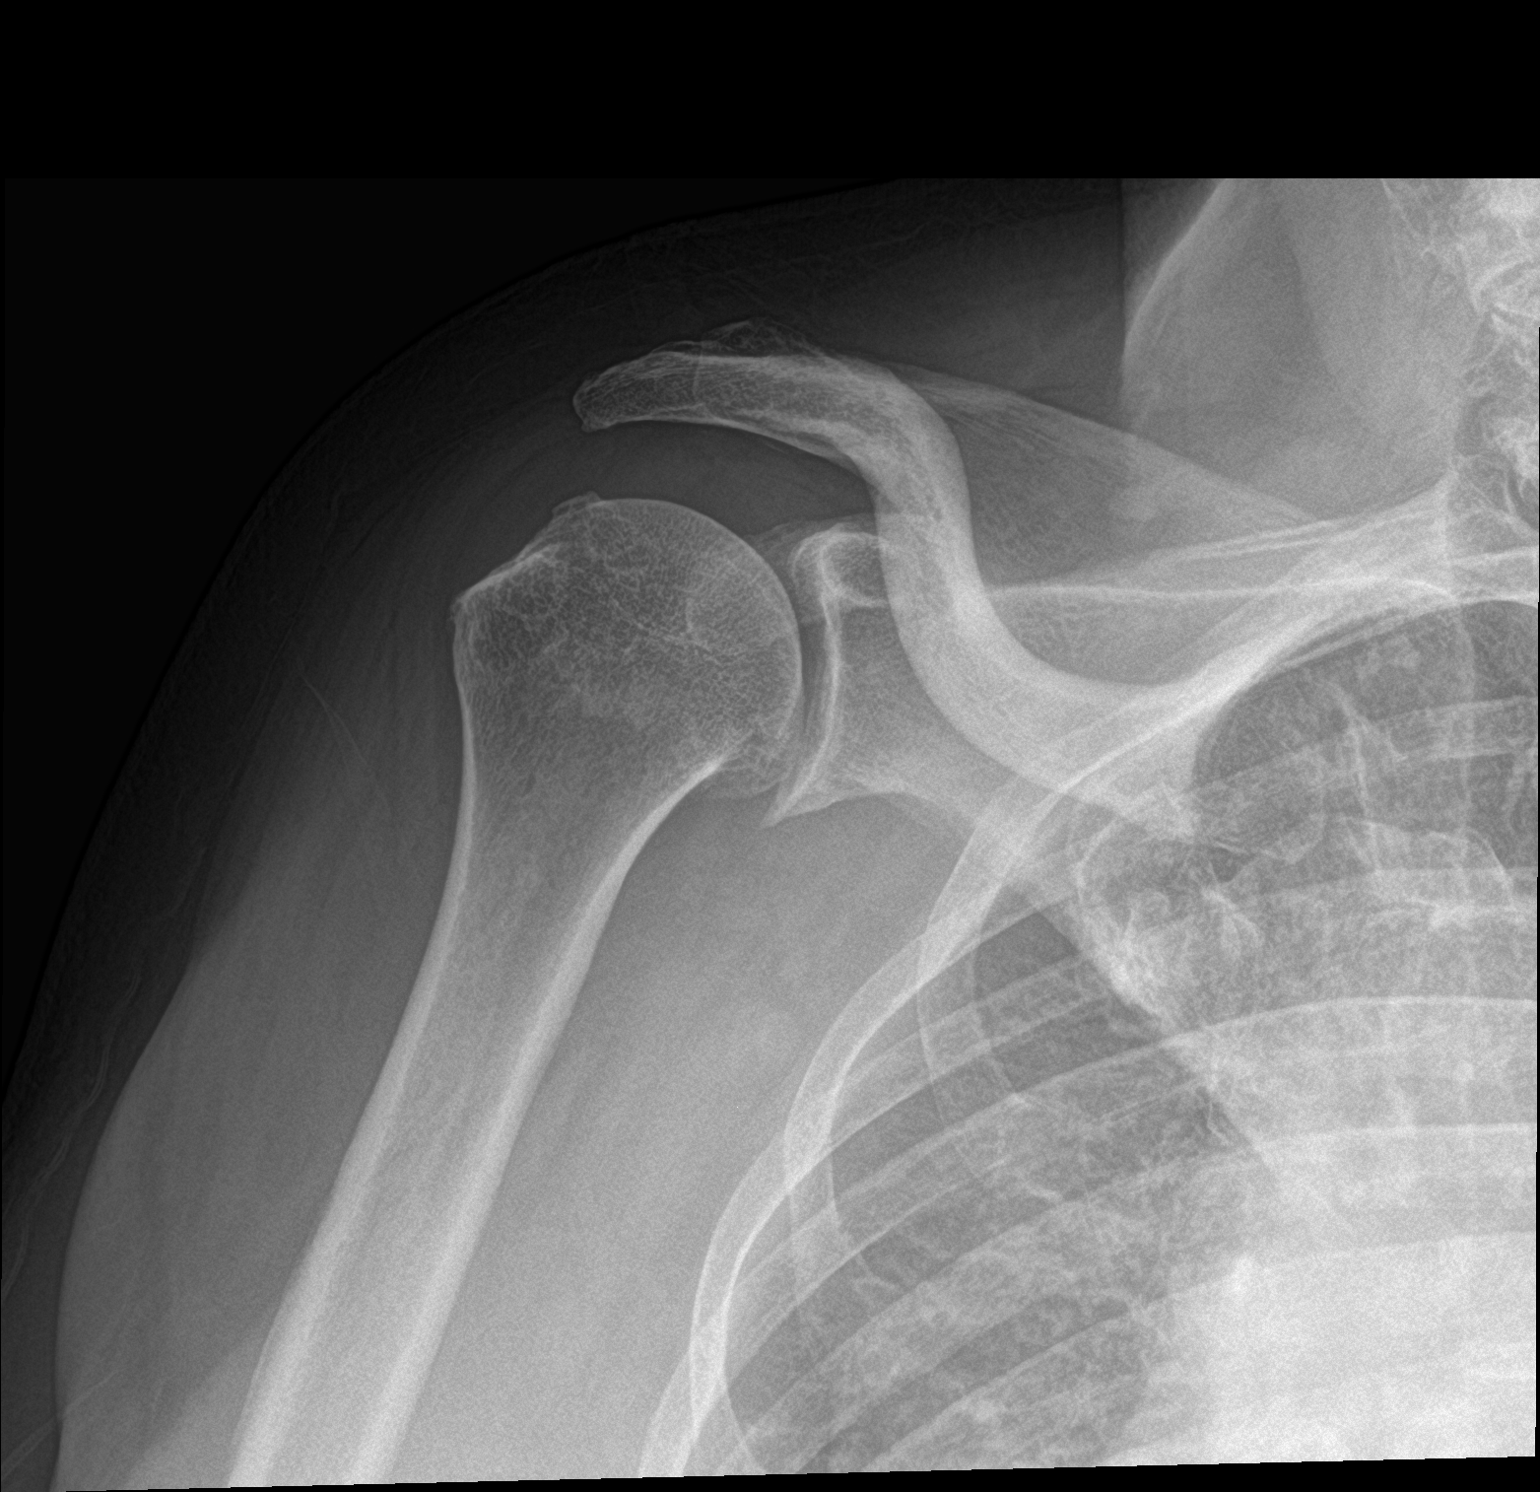

[shoulder y view]
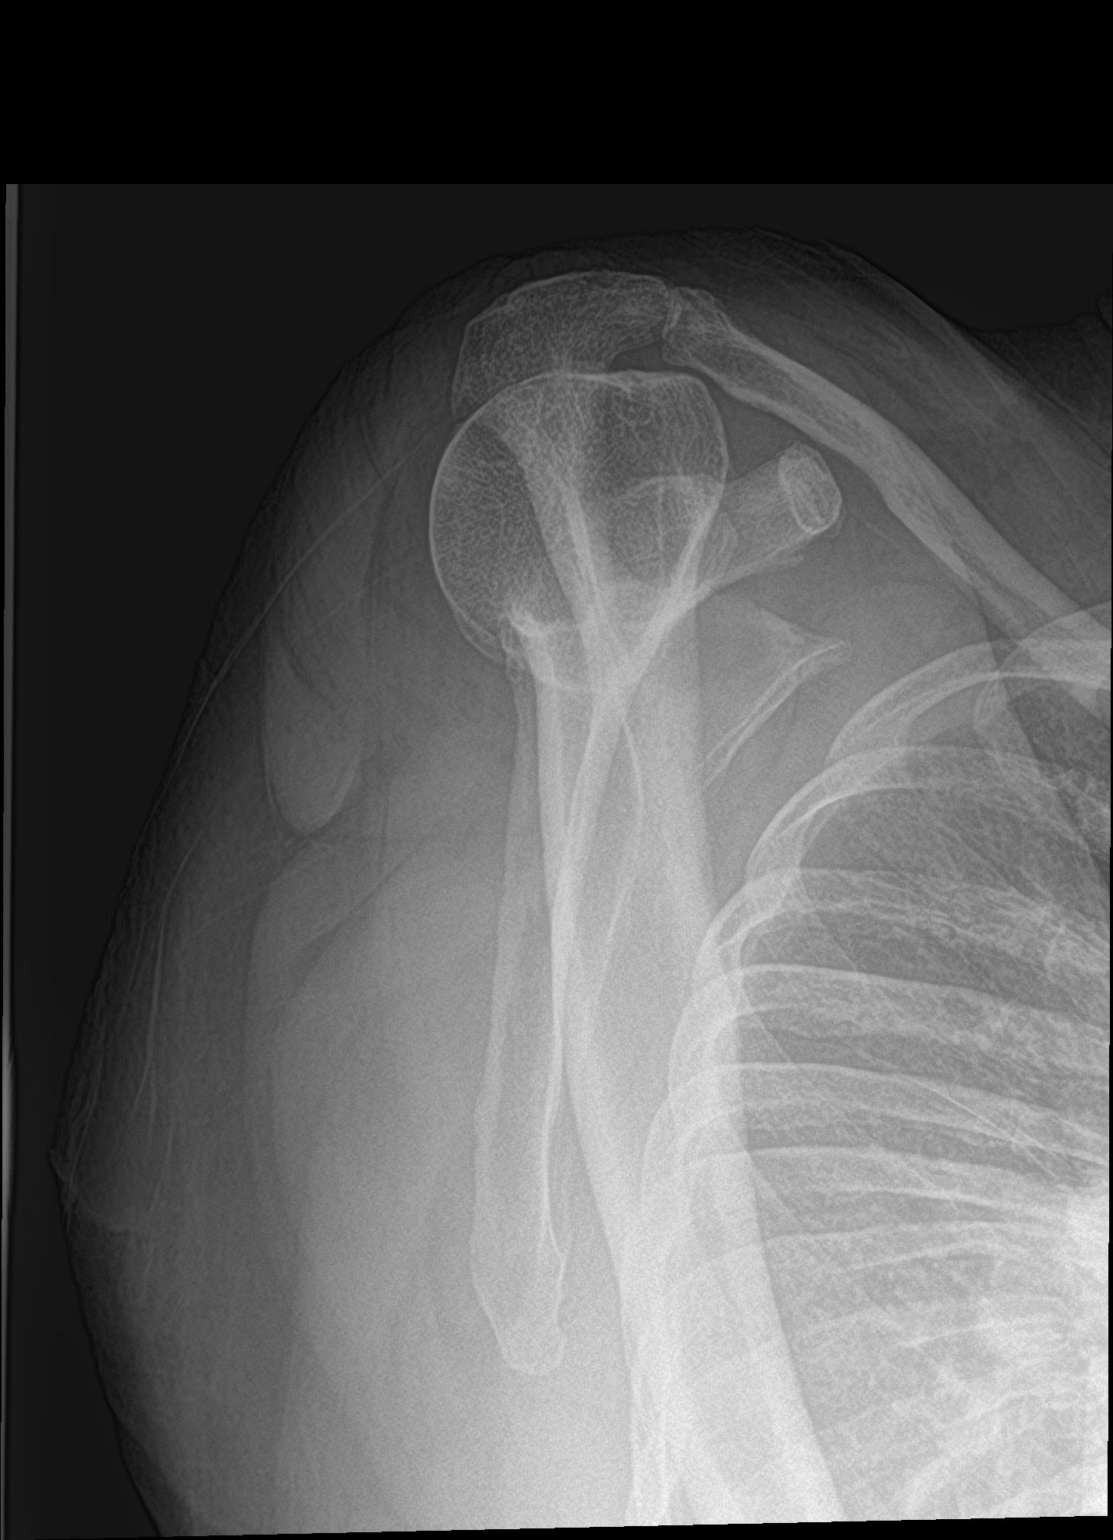

[shoulder axillary]
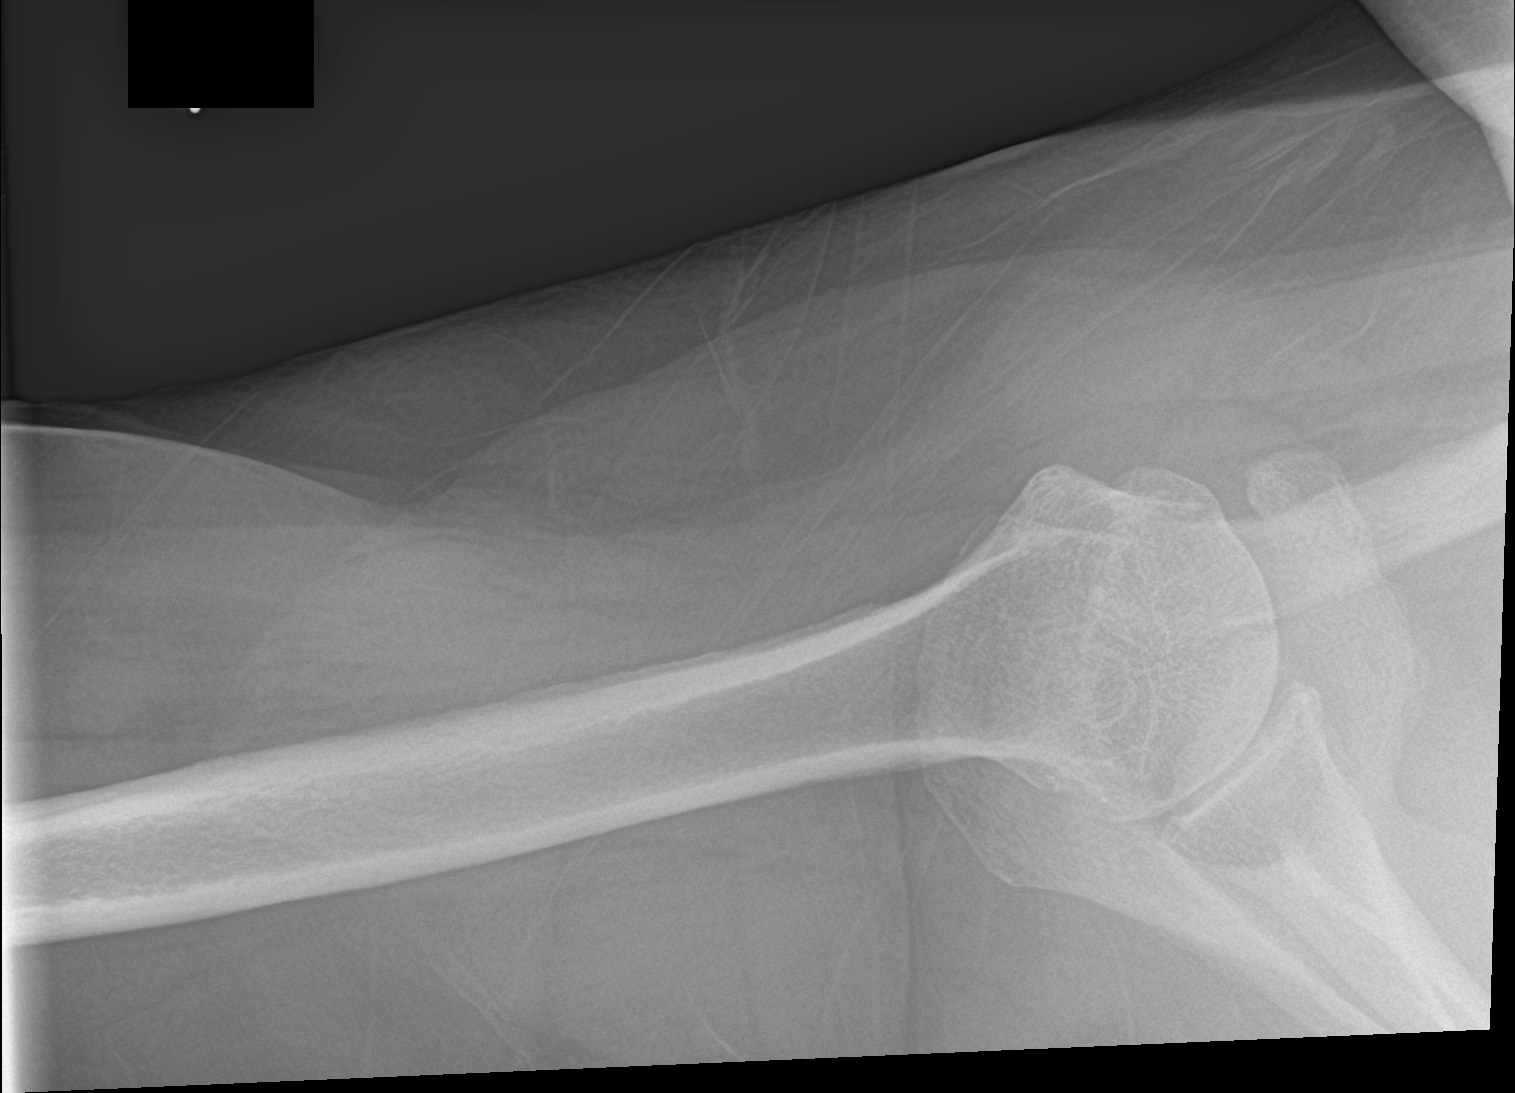

[3 of 3 positions shown; findings below may reference images not displayed]

FINDINGS: Mild degenerative changes of the glenohumeral joint. No evidence of
acute fracture or dislocation.
IMPRESSION: No acute findings.

## 2019-07-08 IMAGING — CR DG CHEST 1V
1 series · 1 of 1 positions shown · non-contrast
Comparison: [DATE]

CLINICAL DATA: Fall.

EXAM:
CHEST  1 VIEW

[chest pa]
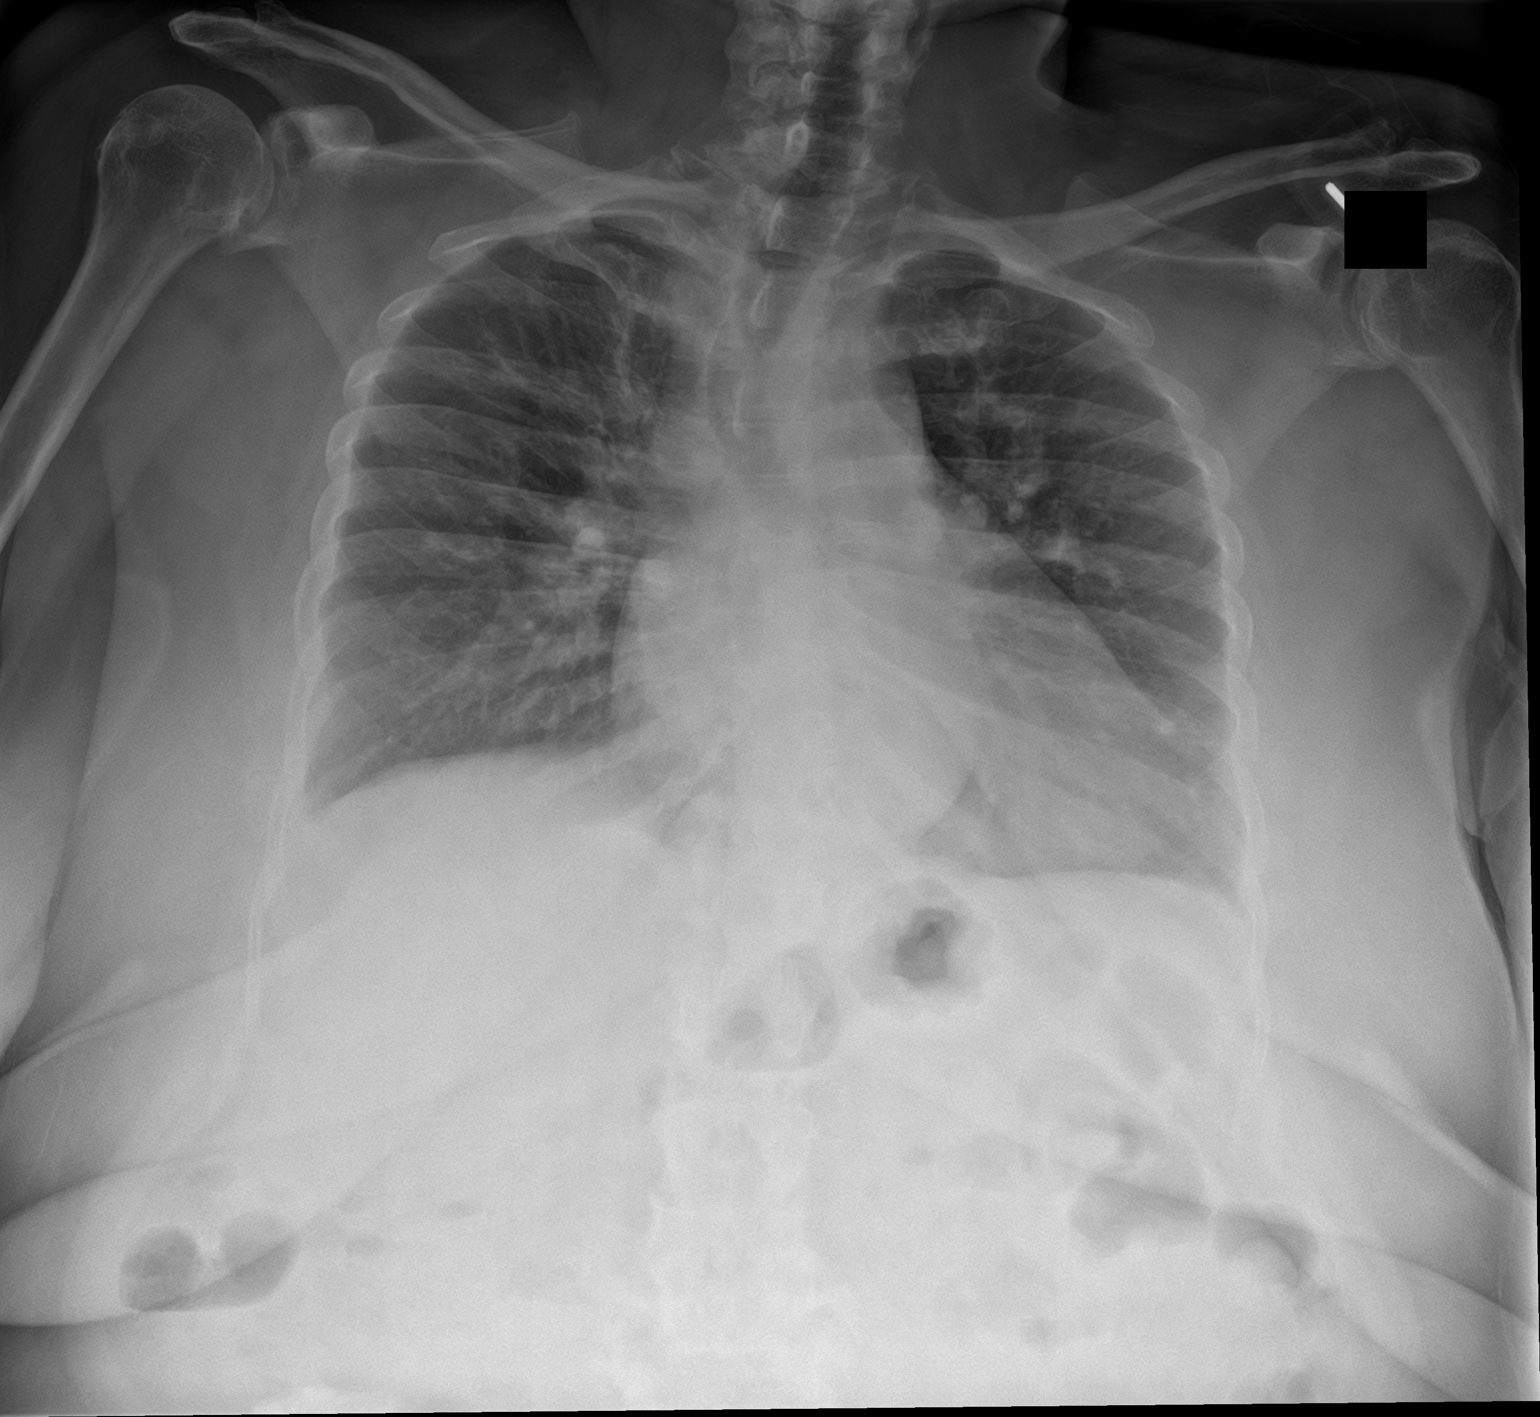

[1 of 1 positions shown; findings below may reference images not displayed]

FINDINGS: Lordotic technique is demonstrated. Lungs are adequately inflated
without focal airspace consolidation or effusion. No pneumothorax.
Cardiomediastinal silhouette and remainder of the exam is unchanged.
IMPRESSION: No acute findings.
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
## 2019-07-08 MED ORDER — MORPHINE SULFATE (PF) 2 MG/ML IV SOLN
2.0000 mg | Freq: Once | INTRAVENOUS | Status: DC
  Filled 2019-07-08: qty 1

## 2019-07-08 MED ORDER — ACETAMINOPHEN 325 MG PO TABS
650.0000 mg | ORAL_TABLET | Freq: Once | ORAL | Status: AC
  Administered 2019-07-08: 650 mg via ORAL
  Filled 2019-07-08: qty 2

## 2019-07-08 MED ORDER — ONDANSETRON 4 MG PO TBDP
4.0000 mg | ORAL_TABLET | Freq: Once | ORAL | Status: DC
  Filled 2019-07-08: qty 1
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
## 2019-07-11 ENCOUNTER — Other Ambulatory Visit: Payer: Self-pay | Admitting: Nurse Practitioner

## 2019-07-11 DIAGNOSIS — Z1231 Encounter for screening mammogram for malignant neoplasm of breast: Secondary | ICD-10-CM

## 2019-07-13 ENCOUNTER — Other Ambulatory Visit: Payer: Self-pay

## 2019-07-13 ENCOUNTER — Ambulatory Visit (INDEPENDENT_AMBULATORY_CARE_PROVIDER_SITE_OTHER): Payer: Medicare Other | Admitting: Nurse Practitioner

## 2019-07-13 ENCOUNTER — Encounter: Payer: Self-pay | Admitting: Nurse Practitioner

## 2019-07-13 VITALS — BP 141/73 | HR 83 | Temp 98.8°F | Resp 16 | Ht 59.5 in

## 2019-07-13 DIAGNOSIS — M25552 Pain in left hip: Secondary | ICD-10-CM

## 2019-07-13 DIAGNOSIS — M25512 Pain in left shoulder: Secondary | ICD-10-CM

## 2019-07-13 MED ORDER — IBUPROFEN 600 MG PO TABS: 600.0000 mg | ORAL_TABLET | Freq: Three times a day (TID) | ORAL | 0 refills | Status: AC | PRN

## 2019-07-13 MED ORDER — TRAMADOL HCL 50 MG PO TABS: 50.0000 mg | ORAL_TABLET | Freq: Two times a day (BID) | ORAL | 0 refills | Status: DC | PRN
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
## 2019-07-13 NOTE — Patient Instructions (Signed)
[name],   Thank you for coming in to clinic today.  1. Take about 1 week break from physical therapy for pain to improve as swelling reduces.  Then, we may need to extend or restart your services.  2. For moderate pain - take prescription strength ibuprofen - DO NOT TAKE your regular advil with your prescription.  3. For severe pain - Take Tramadol 1 tablet at bedtime.  If needed for severe pain during day, take one tab during day.  Please call for possible xrays if pain worsens or does not improve in next 1-2 weeks.    If you have any other questions or concerns, please feel free to call the clinic or send a message through [place]. You may also schedule an earlier appointment if necessary.  You will receive a survey after today's visit either digitally by e-mail or paper by [employer]. Your experiences and feedback matter to [place].  Please respond so we know how we are doing as we provide care for you.  Cassell Smiles, DNP, AGNP-BC Adult Gerontology Nurse Practitioner [place]

## 2019-07-17 ENCOUNTER — Telehealth: Payer: Self-pay | Admitting: Nurse Practitioner

## 2019-07-17 ENCOUNTER — Other Ambulatory Visit: Payer: Self-pay

## 2019-07-17 ENCOUNTER — Ambulatory Visit
Admission: RE | Admit: 2019-07-17 | Discharge: 2019-07-17 | Disposition: A | Discharge status: Home / Self Care | Payer: No Typology Code available for payment source | Source: Ambulatory Visit | Attending: Nurse Practitioner | Admitting: Nurse Practitioner

## 2019-07-17 ENCOUNTER — Telehealth: Payer: Self-pay

## 2019-07-17 ENCOUNTER — Ambulatory Visit
Admission: RE | Admit: 2019-07-17 | Discharge: 2019-07-17 | Disposition: A | Discharge status: Home / Self Care | Payer: No Typology Code available for payment source | Attending: Nurse Practitioner | Admitting: Nurse Practitioner

## 2019-07-17 DIAGNOSIS — M25552 Pain in left hip: Secondary | ICD-10-CM

## 2019-07-17 DIAGNOSIS — M25512 Pain in left shoulder: Secondary | ICD-10-CM
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
## 2019-07-17 NOTE — Telephone Encounter (Signed)
The pt was notified and given all the necessary instructions on how to go about getting that Xray. She verbalize understanding, no questions or concern.
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
## 2019-07-17 NOTE — Telephone Encounter (Signed)
Xray result is not back yet.  She may need to go ahead and resume physical therapy for pain control.  We will call once result is back.

## 2019-07-17 NOTE — Telephone Encounter (Signed)
The pt called back and left a message on the voicemail to find out what is the next step after her Xray. Please advise
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
## 2019-07-17 NOTE — Telephone Encounter (Signed)
I have placed an xray order for her hip.  Pain medications can be extended with additional Tramadol refill, but no other medications will be safe for her at this time.

## 2019-07-18 MED ORDER — TRAMADOL HCL 50 MG PO TABS: 50.0000 mg | ORAL_TABLET | Freq: Two times a day (BID) | ORAL | 0 refills | Status: AC | PRN

## 2019-07-18 NOTE — Telephone Encounter (Addendum)
There is no fracture noted on xray.  Patient's pain is from swelling/inflammation. - Continue ibuprofen as prescribed. - Refill on tramadol sent to be taken as needed.  Recommend physical therapy to help improve pain.  Will reorder this through her facility.  Please ask patient for her facility name / phone number so we can do this.

## 2019-07-18 NOTE — Telephone Encounter (Signed)
The pt was notified of your recommendation. She states that the Tramadol does make her a little dizzy and unstable when she gets up. The faculty name is [facility] ([facility]) Ph# [phone] & [phone].

## 2019-07-19 ENCOUNTER — Telehealth: Payer: Self-pay | Admitting: Nurse Practitioner
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
## 2019-07-20 NOTE — Addendum Note (Signed)
Addended by: Cleaster Corin on: 07/20/2019 08:10 AM   Modules accepted: Orders

## 2019-07-20 NOTE — Telephone Encounter (Signed)
Referral order edited from yesterday's encounter to start with first available company.

## 2019-07-26 ENCOUNTER — Encounter: Payer: Self-pay | Admitting: Nurse Practitioner

## 2019-07-27 ENCOUNTER — Ambulatory Visit
Admission: EM | Admit: 2019-07-27 | Discharge: 2019-07-27 | Disposition: A | Discharge status: Home / Self Care | Payer: Medicare Other

## 2019-07-27 ENCOUNTER — Encounter: Payer: Self-pay | Admitting: Emergency Medicine

## 2019-07-27 ENCOUNTER — Telehealth: Payer: Self-pay | Admitting: Nurse Practitioner

## 2019-07-27 ENCOUNTER — Other Ambulatory Visit: Payer: Self-pay

## 2019-07-27 DIAGNOSIS — M79605 Pain in left leg: Secondary | ICD-10-CM | POA: Diagnosis not present

## 2019-07-27 DIAGNOSIS — M25552 Pain in left hip: Secondary | ICD-10-CM | POA: Diagnosis not present
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
## 2019-07-27 NOTE — Telephone Encounter (Signed)
I spoke with the patient briefly, because she was currently at the [facility]. I also attempted to contact the pt daughter, no answer. LMOM to return my call.
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
## 2019-07-27 NOTE — Discharge Instructions (Signed)
Leave urgent care and go to [facility]. You need further evaluation of your pain by a specialist given that your pain and swelling continues to increase despite conservative treatment.   Honor Loh, MSN, APRN, FNP-C, CEN Advanced Practice Provider [facility] 07/27/2019 4:06 PM
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
## 2019-08-01 ENCOUNTER — Telehealth: Payer: Self-pay

## 2019-08-01 NOTE — Telephone Encounter (Signed)
I spoke with the patient and she want to know if she could go back to taking otc Ibuprofen. She state Lauren told her to discontinue the Ibuprofen. I informed the patient that Lauren d/c Ibuprofen while she was on Tramadol, but since she discontinued the Tramadol she can proceed with the Ibuprofen. The pt state her pain is improving and she contributes that to PT.

## 2019-08-01 NOTE — Telephone Encounter (Signed)
Patient called stating she was back in the hospital and still in pain.  She needs to know what to do about her pain.  Please call patient

## 2019-08-18 ENCOUNTER — Ambulatory Visit
Admission: RE | Admit: 2019-08-18 | Discharge: 2019-08-18 | Disposition: A | Discharge status: Home / Self Care | Payer: Medicare Other | Source: Ambulatory Visit | Attending: Nurse Practitioner | Admitting: Nurse Practitioner

## 2019-08-18 DIAGNOSIS — Z1231 Encounter for screening mammogram for malignant neoplasm of breast: Secondary | ICD-10-CM | POA: Diagnosis present

## 2019-08-18 IMAGING — MG DIGITAL SCREENING BILAT W/ TOMO W/ CAD
6 of 9 series · 6 of 25 positions shown · non-contrast
Comparison: Previous exam(s).

CLINICAL DATA: Screening.

EXAM:
DIGITAL SCREENING BILATERAL MAMMOGRAM WITH TOMO AND CAD

[R CV]
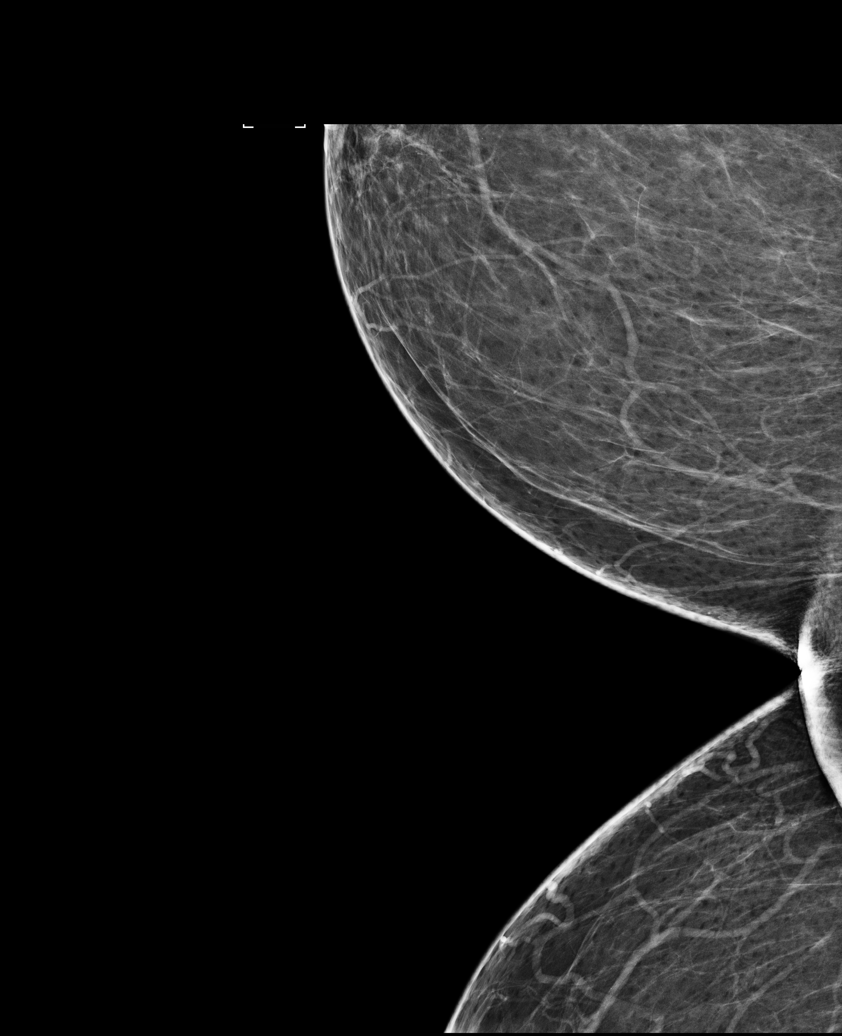

[L MLO synth-2D]
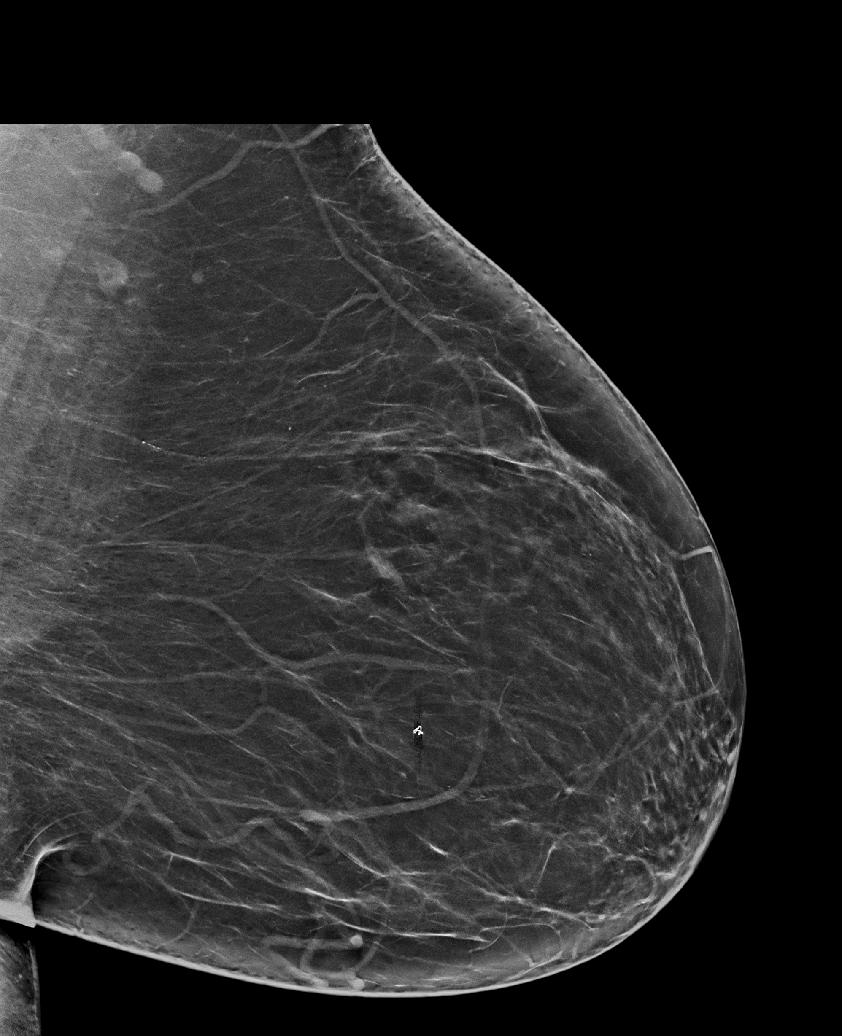

[R CC synth-2D]
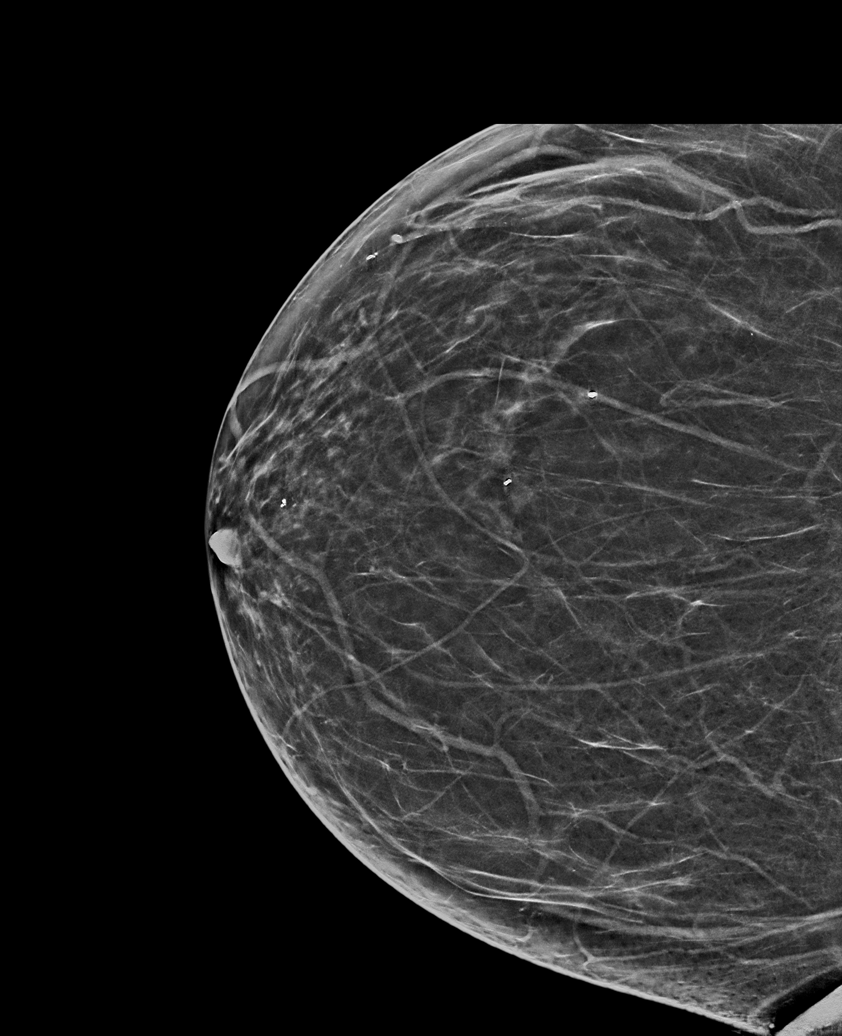

[R MLO synth-2D]
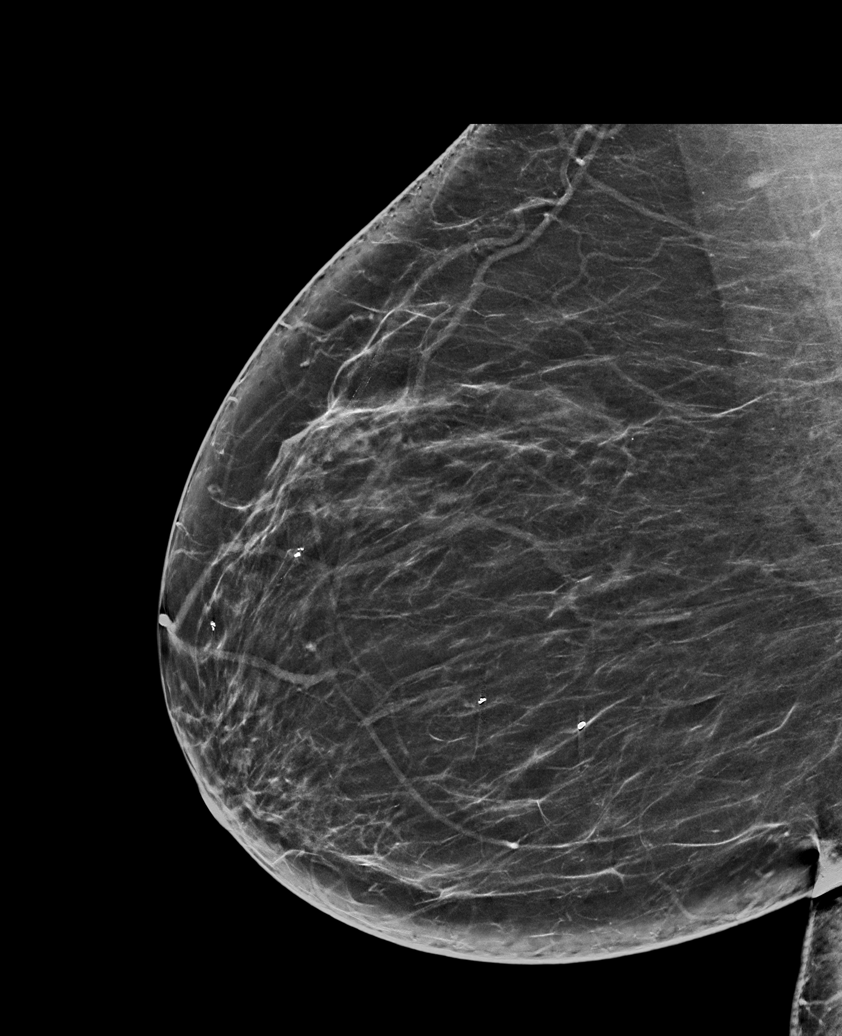

[L CC synth-2D]
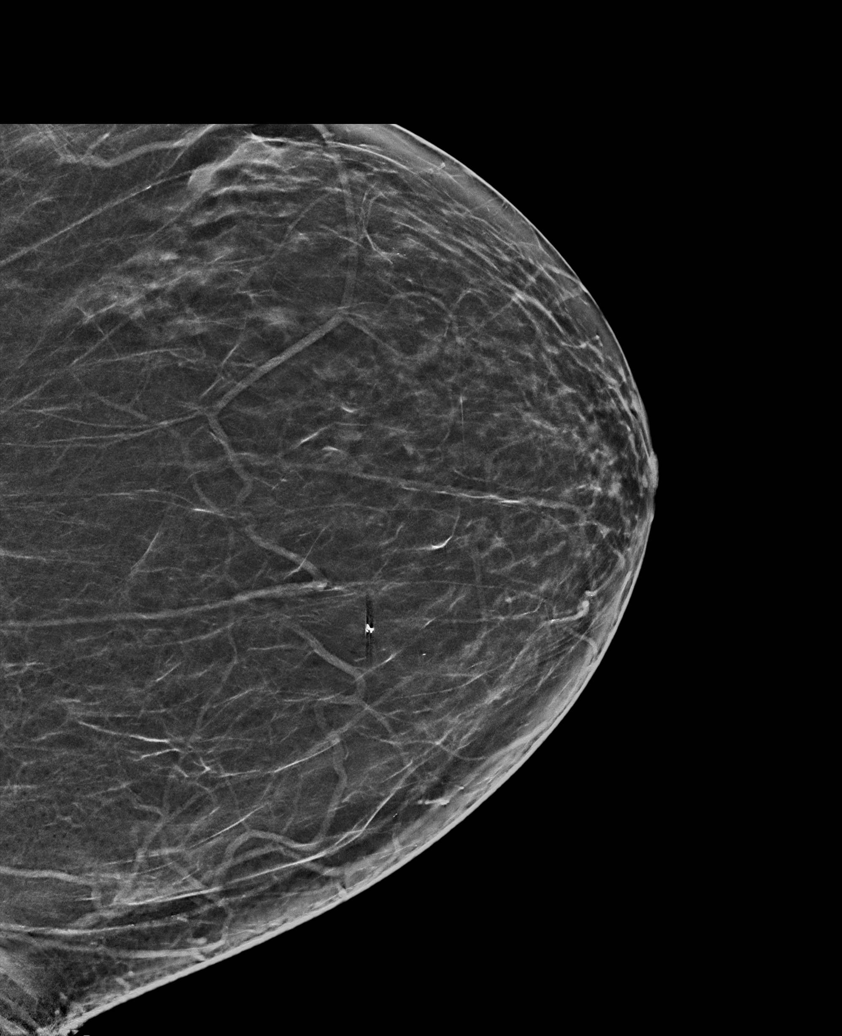

[L MLO tomo · tomo slice 41/80.0]
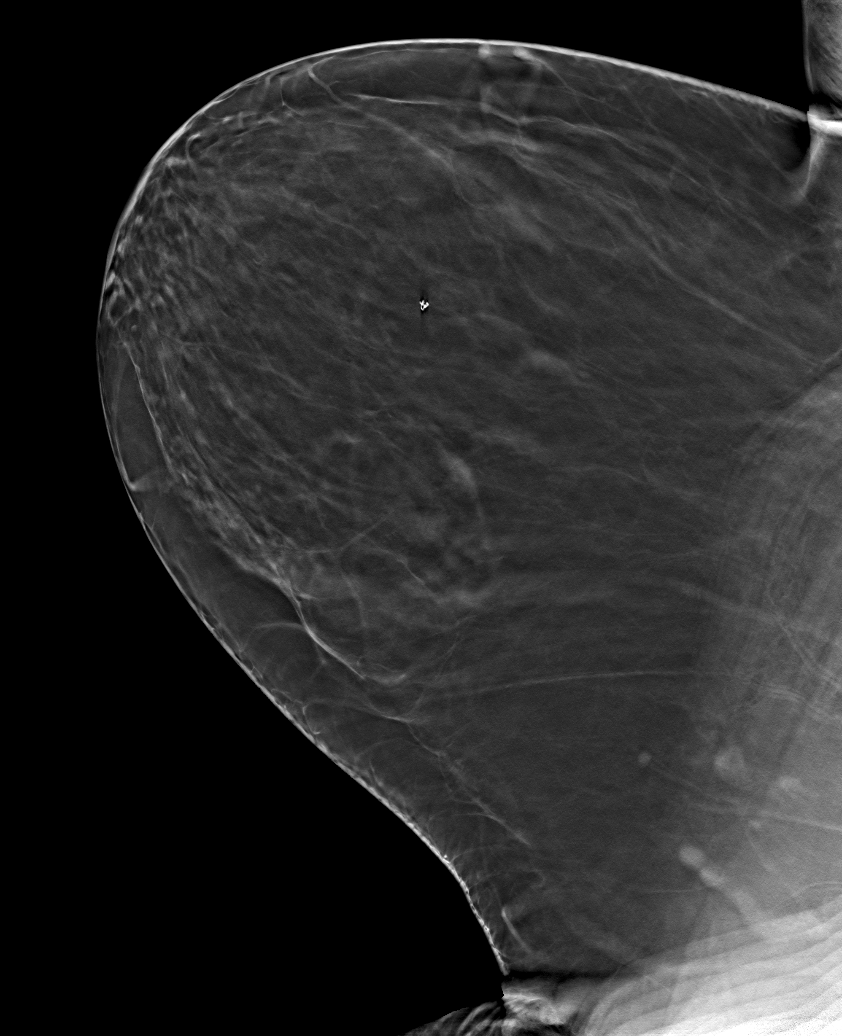

[6 of 25 positions shown; findings below may reference images not displayed]

ACR Breast Density Category b: There are scattered areas of
fibroglandular density.
FINDINGS: There are no findings suspicious for malignancy. Images were
processed with CAD.
IMPRESSION: No mammographic evidence of malignancy. A result letter of this
screening mammogram will be mailed directly to the patient.

RECOMMENDATION:
Screening mammogram in one year. (Code:[TQ])

BI-RADS CATEGORY  1: Negative.

## 2019-08-23 ENCOUNTER — Telehealth: Payer: Self-pay

## 2019-08-23 NOTE — Telephone Encounter (Signed)
I spoke with [place] and her daughter about rescheduling the patient appt. I also confirmed that the patient had enough medication to make it to her next appointment.

## 2019-08-29 ENCOUNTER — Ambulatory Visit: Payer: Medicare Other | Admitting: Nurse Practitioner

## 2019-08-30 ENCOUNTER — Encounter: Payer: Self-pay | Admitting: Family Medicine

## 2019-08-30 ENCOUNTER — Other Ambulatory Visit: Payer: Self-pay

## 2019-08-30 ENCOUNTER — Ambulatory Visit (INDEPENDENT_AMBULATORY_CARE_PROVIDER_SITE_OTHER): Payer: Medicare Other | Admitting: Family Medicine

## 2019-08-30 DIAGNOSIS — F5104 Psychophysiologic insomnia: Secondary | ICD-10-CM | POA: Diagnosis not present

## 2019-08-30 DIAGNOSIS — F411 Generalized anxiety disorder: Secondary | ICD-10-CM

## 2019-08-30 MED ORDER — ALPRAZOLAM 0.5 MG PO TABS: 0.5000 mg | ORAL_TABLET | Freq: Three times a day (TID) | ORAL | 2 refills | Status: DC | PRN
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
## 2019-10-30 ENCOUNTER — Other Ambulatory Visit: Payer: Self-pay | Admitting: Nurse Practitioner

## 2019-10-30 DIAGNOSIS — E782 Mixed hyperlipidemia: Secondary | ICD-10-CM

## 2019-11-22 ENCOUNTER — Ambulatory Visit (INDEPENDENT_AMBULATORY_CARE_PROVIDER_SITE_OTHER): Payer: Medicare Other | Admitting: Family Medicine

## 2019-11-22 ENCOUNTER — Encounter: Payer: Self-pay | Admitting: Family Medicine

## 2019-11-22 ENCOUNTER — Ambulatory Visit: Payer: Medicare Other | Admitting: Family Medicine

## 2019-11-22 ENCOUNTER — Other Ambulatory Visit: Payer: Self-pay

## 2019-11-22 VITALS — BP 125/58 | HR 69 | Temp 98.6°F | Resp 16 | Ht <= 58 in | Wt 192.0 lb

## 2019-11-22 DIAGNOSIS — F5104 Psychophysiologic insomnia: Secondary | ICD-10-CM

## 2019-11-22 DIAGNOSIS — F411 Generalized anxiety disorder: Secondary | ICD-10-CM | POA: Diagnosis not present

## 2019-11-22 DIAGNOSIS — Z6841 Body Mass Index (BMI) 40.0 and over, adult: Secondary | ICD-10-CM

## 2019-11-22 DIAGNOSIS — I10 Essential (primary) hypertension: Secondary | ICD-10-CM | POA: Diagnosis not present

## 2019-11-22 DIAGNOSIS — F3341 Major depressive disorder, recurrent, in partial remission: Secondary | ICD-10-CM | POA: Diagnosis not present

## 2019-11-22 MED ORDER — ALPRAZOLAM 0.5 MG PO TABS: 0.5000 mg | ORAL_TABLET | Freq: Three times a day (TID) | ORAL | 2 refills | Status: DC | PRN

## 2019-11-22 MED ORDER — TRIAMTERENE-HCTZ 37.5-25 MG PO TABS: 1.0000 | ORAL_TABLET | Freq: Every day | ORAL | 1 refills | Status: DC

## 2019-11-22 NOTE — Patient Instructions (Addendum)
Thank you for coming to the office today.  Refilled medicine for 90 days.   Please schedule a Follow-up Appointment to: Return in about 3 months (around 02/19/2020) for 3 months with new provider refill meds Anxiety.  If you have any other questions or concerns, please feel free to call the office or send a message through [place]. You may also schedule an earlier appointment if necessary.  Additionally, you may be receiving a survey about your experience at our office within a few days to 1 week by e-mail or mail. We value your feedback.  Nobie Putnam, DO [facility]
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
## 2019-11-23 ENCOUNTER — Other Ambulatory Visit: Payer: Self-pay | Admitting: Nurse Practitioner

## 2019-11-23 DIAGNOSIS — F5104 Psychophysiologic insomnia: Secondary | ICD-10-CM

## 2019-11-23 DIAGNOSIS — F411 Generalized anxiety disorder: Secondary | ICD-10-CM

## 2020-02-19 ENCOUNTER — Encounter: Payer: Self-pay | Admitting: Family Medicine

## 2020-02-19 ENCOUNTER — Ambulatory Visit (INDEPENDENT_AMBULATORY_CARE_PROVIDER_SITE_OTHER): Payer: Medicare Other | Admitting: Family Medicine

## 2020-02-19 ENCOUNTER — Other Ambulatory Visit: Payer: Self-pay

## 2020-02-19 VITALS — BP 138/54 | HR 62 | Temp 97.1°F | Ht <= 58 in | Wt 188.6 lb

## 2020-02-19 DIAGNOSIS — R635 Abnormal weight gain: Secondary | ICD-10-CM

## 2020-02-19 DIAGNOSIS — F411 Generalized anxiety disorder: Secondary | ICD-10-CM

## 2020-02-19 DIAGNOSIS — E7800 Pure hypercholesterolemia, unspecified: Secondary | ICD-10-CM | POA: Diagnosis not present

## 2020-02-19 DIAGNOSIS — F5104 Psychophysiologic insomnia: Secondary | ICD-10-CM

## 2020-02-19 DIAGNOSIS — D649 Anemia, unspecified: Secondary | ICD-10-CM

## 2020-02-19 DIAGNOSIS — I10 Essential (primary) hypertension: Secondary | ICD-10-CM | POA: Diagnosis not present

## 2020-02-19 DIAGNOSIS — E782 Mixed hyperlipidemia: Secondary | ICD-10-CM | POA: Diagnosis not present

## 2020-02-19 MED ORDER — ALPRAZOLAM 0.5 MG PO TABS: 0.5000 mg | ORAL_TABLET | Freq: Three times a day (TID) | ORAL | 2 refills | Status: DC | PRN

## 2020-02-19 NOTE — Progress Notes (Signed)
Subjective:    Patient ID: Chelsea [name], female    DOB: 10-22-1945, 74 y.o.   MRN: [id number]  Chelsea [name] is a 74 y.o. female presenting on 02/19/2020 for Anxiety   HPI  Chelsea [name] presents to clinic for a follow up on her anxiety.  Reports needing medication refill of her alprazolam.  Denies any acute concerns or recent exacerbation of her anxiety.    Hypertension - She is not checking BP at home or outside of clinic.    - Current medications: triamterene-hydrochlorothiazide 37.5-25mg  daily, tolerating well without side effects - She is not currently symptomatic. - Pt denies headache, lightheadedness, dizziness, changes in vision, chest tightness/pressure, palpitations, leg swelling, sudden loss of speech or loss of consciousness. - She  reports no regular exercise routine. - Her diet is high in salt, high in fat, and high in carbohydrates.  Depression screen [facility] 2/9 11/23/2019 08/30/2019 07/13/2019  Decreased Interest 2 2 0  Down, Depressed, Hopeless 1 1 0  PHQ - 2 Score 3 3 0  Altered sleeping 1 1 0  Tired, decreased energy 0 2 0  Change in appetite 0 0 0  Feeling bad or failure about yourself  0 0 0  Trouble concentrating 2 3 0  Moving slowly or fidgety/restless 2 2 0  Suicidal thoughts 0 0 0  PHQ-9 Score 8 11 0  Difficult doing work/chores Not difficult at all Somewhat difficult -    Social History   Tobacco Use  . Smoking status: Never Smoker  . Smokeless tobacco: Never Used  Substance Use Topics  . Alcohol use: Never  . Drug use: No    Review of Systems  Constitutional: Negative.   HENT: Negative.   Eyes: Negative.   Respiratory: Negative.   Cardiovascular: Negative.   Gastrointestinal: Negative.   Endocrine: Negative.   Genitourinary: Negative.   Musculoskeletal: Negative.        Ambulates with cane and/or walker  Skin: Negative.   Allergic/Immunologic: Negative.   Hematological: Negative.   Psychiatric/Behavioral: Negative.    Per HPI  unless specifically indicated above     Objective:    BP (!) 138/54   Pulse 62   Temp (!) 97.1 F (36.2 C) (Temporal)   Ht 4\' 9"  (1.448 m)   Wt 188 lb 9.6 oz (85.5 kg)   BMI 40.81 kg/m   Wt Readings from Last 3 Encounters:  02/19/20 188 lb 9.6 oz (85.5 kg)  11/22/19 192 lb (87.1 kg)  07/27/19 180 lb (81.6 kg)    Physical Exam Vitals reviewed.  Constitutional:      General: She is not in acute distress.    Appearance: Normal appearance. She is well-developed and well-groomed. She is obese. She is not ill-appearing or toxic-appearing.  HENT:     Head: Normocephalic.  Eyes:     General: Lids are normal. Vision grossly intact.        Right eye: No discharge.        Left eye: No discharge.     Extraocular Movements: Extraocular movements intact.     Conjunctiva/sclera: Conjunctivae normal.     Pupils: Pupils are equal, round, and reactive to light.  Cardiovascular:     Rate and Rhythm: Normal rate and regular rhythm.     Pulses: Normal pulses.          Dorsalis pedis pulses are 2+ on the right side and 2+ on the left side.     Heart sounds: Normal heart  sounds. No murmur. No friction rub. No gallop.      Comments: Non-pitting edema.  Patient states resolves when elevates feet. Pulmonary:     Effort: Pulmonary effort is normal. No respiratory distress.     Breath sounds: Normal breath sounds.  Musculoskeletal:     Right lower leg: 1+ Edema present.     Left lower leg: 1+ Edema present.  Feet:     Right foot:     Skin integrity: Skin integrity normal.     Left foot:     Skin integrity: Skin integrity normal.  Skin:    General: Skin is warm and dry.     Capillary Refill: Capillary refill takes less than 2 seconds.  Neurological:     General: No focal deficit present.     Mental Status: She is alert and oriented to person, place, and time.     Cranial Nerves: No cranial nerve deficit.     Sensory: No sensory deficit.     Motor: No weakness.     Coordination:  Coordination normal.     Gait: Gait abnormal.     Comments: Walks with cane favoring left side.  Psychiatric:        Attention and Perception: Attention and perception normal.        Mood and Affect: Mood and affect normal.        Speech: Speech normal.        Behavior: Behavior normal. Behavior is cooperative.        Thought Content: Thought content normal.        Cognition and Memory: Cognition and memory normal.        Judgment: Judgment normal.    Results for orders placed or performed during the hospital encounter of 07/08/19  CBC with Differential  Result Value Ref Range   WBC 5.0 4.0 - 10.5 K/uL   RBC 3.93 3.87 - 5.11 MIL/uL   Hemoglobin 11.4 (L) 12.0 - 15.0 g/dL   HCT 36.1 36.0 - 46.0 %   MCV 91.9 80.0 - 100.0 fL   MCH 29.0 26.0 - 34.0 pg   MCHC 31.6 30.0 - 36.0 g/dL   RDW 13.4 11.5 - 15.5 %   Platelets 205 150 - 400 K/uL   nRBC 0.0 0.0 - 0.2 %   Neutrophils Relative % 53 %   Neutro Abs 2.6 1.7 - 7.7 K/uL   Lymphocytes Relative 33 %   Lymphs Abs 1.7 0.7 - 4.0 K/uL   Monocytes Relative 12 %   Monocytes Absolute 0.6 0.1 - 1.0 K/uL   Eosinophils Relative 2 %   Eosinophils Absolute 0.1 0.0 - 0.5 K/uL   Basophils Relative 0 %   Basophils Absolute 0.0 0.0 - 0.1 K/uL   Immature Granulocytes 0 %   Abs Immature Granulocytes 0.01 0.00 - 0.07 K/uL  Comprehensive metabolic panel  Result Value Ref Range   Sodium 138 135 - 145 mmol/L   Potassium 3.6 3.5 - 5.1 mmol/L   Chloride 100 98 - 111 mmol/L   CO2 30 22 - 32 mmol/L   Glucose, Bld 93 70 - 99 mg/dL   BUN 16 8 - 23 mg/dL   Creatinine, Ser 0.95 0.44 - 1.00 mg/dL   Calcium 9.3 8.9 - 10.3 mg/dL   Total Protein 7.2 6.5 - 8.1 g/dL   Albumin 3.4 (L) 3.5 - 5.0 g/dL   AST 28 15 - 41 U/L   ALT 37 0 - 44 U/L   Alkaline Phosphatase  70 38 - 126 U/L   Total Bilirubin 0.6 0.3 - 1.2 mg/dL   GFR calc non Af Amer 59 (L) >60 mL/min   GFR calc Af Amer >60 >60 mL/min   Anion gap 8 5 - 15      Assessment & Plan:   Problem List  Items Addressed This Visit      Cardiovascular and Mediastinum   Essential hypertension - Primary    Controlled hypertension.  BP is slightly above goal < 130/80.  Pt is not working on lifestyle modifications.  Taking medications tolerating well without side effects. Complications: Obesity, Hyperlipidemia, Impaired mobility  Plan: 1. Continue taking triamterene-hydrochlorothiaizide 37.5-25mg  daily 2. Obtain labs in the next 1-2 weeks  3. Encouraged heart healthy diet and increasing exercise to 30 minutes most days of the week, going no more than 2 days in a row without exercise. 4. Check BP 1-2 x per week at home, keep log, and bring to clinic at next appointment. 5. Follow up 6 months.         Relevant Orders   CBC with Differential   COMPLETE METABOLIC PANEL WITH GFR     Other   Hyperlipidemia   Relevant Orders   Lipid Profile   Lipid Profile   GAD (generalized anxiety disorder)    Currently well controlled with fluoxetine 60mg  daily and alprazolam 0.5mg  TID PRN.  Has a good support system and has been without recent exacerbation of symptoms.  Will continue medication treatment plan at this time.  Plan: 1. Continue fluoxetine 60mg  daily 2. Continue alprazolam 0.5mg  TID PRN 3. We will see you back in clinic in 6 months, or sooner, should the need arise.      Relevant Medications   ALPRAZolam (XANAX) 0.5 MG tablet   Chronic anemia   Relevant Orders   CBC with Differential   COMPLETE METABOLIC PANEL WITH GFR   Pure hypercholesterolemia   Relevant Orders   Lipid Profile    Other Visit Diagnoses    Weight gain       Relevant Orders   Thyroid Panel With TSH   Psychophysiological insomnia       Relevant Medications   ALPRAZolam (XANAX) 0.5 MG tablet      Meds ordered this encounter  Medications  . ALPRAZolam (XANAX) 0.5 MG tablet    Sig: Take 1 tablet (0.5 mg total) by mouth 3 (three) times daily as needed for anxiety.    Dispense:  90 tablet    Refill:  2       Follow up plan: Return in about 6 months (around 08/21/2020) for Hypertension & Anxiety F/U.   Harlin Rain, [place] [facility] Nurse Practition[facility] 02/19/2020, 3:06 PM

## 2020-02-19 NOTE — Assessment & Plan Note (Signed)
Controlled hypertension.  BP is slightly above goal < 130/80.  Pt is not working on lifestyle modifications.  Taking medications tolerating well without side effects. Complications: Obesity, Hyperlipidemia, Impaired mobility  Plan: 1. Continue taking triamterene-hydrochlorothiaizide 37.5-25mg  daily 2. Obtain labs in the next 1-2 weeks  3. Encouraged heart healthy diet and increasing exercise to 30 minutes most days of the week, going no more than 2 days in a row without exercise. 4. Check BP 1-2 x per week at home, keep log, and bring to clinic at next appointment. 5. Follow up 6 months.

## 2020-02-19 NOTE — Patient Instructions (Signed)
As we discussed, have your labs drawn in the next 1-2 weeks and we will contact you with the results.  Your medication refills have been sent to your pharmacy on file.  The following recommendations are helpful adjuncts for helping rebalance your mood.  Eat a nourishing diet. Ensure adequate intake of calories, protein, carbs, fat, vitamins, and minerals. Prioritize whole foods at each meal, including meats, vegetables, fruits, nuts and seeds, etc.   Avoid inflammatory and/or "junk" foods, such as sugar, omega-6 fats, refined grains, chemicals, and preservatives are common in packaged and prepared foods. Minimize or completely avoid these ingredients and stick to whole foods with little to no additives. Cook from scratch as much as possible for more control over what you eat  Get enough sleep. Poor sleep is significantly associated with depression and anxiety. Make 7-9 hours of sleep nightly a top priority  Exercise appropriately. Exercise is known to improve brain functioning and boost mood. Aim for 30 minutes of daily physical activity. Avoid "overtraining," which can cause mental disturbances  Assess your light exposure. Not enough natural light during the day and too much artificial light can have a major impact on your mood. Get outside as often as possible during daylight hours. Minimize light exposure after dark and avoid the use of electronics that give off blue light before bed  Manage your stress.  Use daily stress management techniques such as meditation, yoga, or mindfulness to retrain your brain to respond differently to stress. Try deep breathing to deactivate your "fight or flight" response.  There are many of sources with apps like Headspace, Calm or a variety of YouTube videos (videos from Gwynne Edinger have guided meditation)  Prioritize your social life. Work on building social support with new friends or improve current relationships. Consider getting a pet that allows for  companionship, social interaction, and physical touch. Try volunteering or joining a faith-based community to increase your sense of purpose  4-7-8 breathing technique at bedtime: breathe in to count of 4, hold breath for count of 7, exhale for count of 8; do 3-5 times for letting go of overactive thoughts  Take time to play Unstructured "play" time can help reduce anxiety and depression Options for play include music, games, sports, dance, art, etc.  Try to add daily omega 3 fatty acids, magnesium, B complex, and balanced amino acid supplements to help improve mood and anxiety.  We will plan to see you back in 6 months for follow up on your hypertension and anxiety  You will receive a survey after today's visit either digitally by e-mail or paper by [place] mail. Your experiences and feedback matter to [place].  Please respond so we know how we are doing as we provide care for you.  Call us with any questions/concerns/needs.  It is my goal to be available to you for your health concerns.  Thanks for choosing me to be a partner in your healthcare needs!  Harlin Rain, FNP-C Family Nurse Practitioner [facility] Phone: [phone]

## 2020-02-19 NOTE — Assessment & Plan Note (Signed)
Currently well controlled with fluoxetine 60mg  daily and alprazolam 0.5mg  TID PRN.  Has a good support system and has been without recent exacerbation of symptoms.  Will continue medication treatment plan at this time.  Plan: 1. Continue fluoxetine 60mg  daily 2. Continue alprazolam 0.5mg  TID PRN 3. We will see you back in clinic in 6 months, or sooner, should the need arise.

## 2020-02-23 ENCOUNTER — Other Ambulatory Visit: Payer: Self-pay | Admitting: Family Medicine

## 2020-02-23 DIAGNOSIS — F5104 Psychophysiologic insomnia: Secondary | ICD-10-CM

## 2020-02-23 DIAGNOSIS — F411 Generalized anxiety disorder: Secondary | ICD-10-CM

## 2020-02-23 NOTE — Telephone Encounter (Signed)
Requested Prescriptions  Pending Prescriptions Disp Refills  . FLUoxetine (PROZAC) 20 MG capsule [Pharmacy Med Name: FLUOXETINE HCL 20 MG CAPSULE] 270 capsule 1    Sig: TAKE 3 CAPSULES BY MOUTH EVERY DAY     Psychiatry:  Antidepressants - SSRI Passed - 02/23/2020  5:02 PM      Passed - Completed PHQ-2 or PHQ-9 in the last 360 days.      Passed - Valid encounter within last 6 months    Recent Outpatient Visits          4 days ago Essential hypertension   [facility], Lupita Raider, FNP   3 months ago BMI 40.0-44.9, adult [facility])   [place], DO   5 months ago GAD (generalized anxiety disorder)   [place], DO   7 months ago Left hip pain   [facility] [employer]key [employer], NP   8 months ago Depression with anxiety   [facility] Merrilyn Puma, Jerrel Ivory, NP

## 2020-02-26 ENCOUNTER — Other Ambulatory Visit: Payer: Self-pay | Admitting: Family Medicine

## 2020-02-26 DIAGNOSIS — R8290 Unspecified abnormal findings in urine: Secondary | ICD-10-CM

## 2020-02-26 LAB — COMPLETE METABOLIC PANEL WITH GFR
AG Ratio: 1.2 (calc) (ref 1.0–2.5)
ALT: 9 U/L (ref 6–29)
AST: 14 U/L (ref 10–35)
Albumin: 3.7 g/dL (ref 3.6–5.1)
Alkaline phosphatase (APISO): 80 U/L (ref 37–153)
BUN/Creatinine Ratio: 11 (calc) (ref 6–22)
BUN: 11 mg/dL (ref 7–25)
CO2: 29 mmol/L (ref 20–32)
Calcium: 9.2 mg/dL (ref 8.6–10.4)
Chloride: 105 mmol/L (ref 98–110)
Creat: 1.04 mg/dL — ABNORMAL HIGH (ref 0.60–0.93)
GFR, Est African American: 61 mL/min/{1.73_m2} (ref >=60)
GFR, Est Non African American: 53 mL/min/{1.73_m2} — ABNORMAL LOW (ref >=60)
Globulin: 3 g/dL (calc) (ref 1.9–3.7)
Glucose, Bld: 96 mg/dL (ref 65–99)
Potassium: 3.4 mmol/L — ABNORMAL LOW (ref 3.5–5.3)
Sodium: 143 mmol/L (ref 135–146)
Total Bilirubin: 0.7 mg/dL (ref 0.2–1.2)
Total Protein: 6.7 g/dL (ref 6.1–8.1)

## 2020-02-26 LAB — LIPID PANEL
Cholesterol: 238 mg/dL — ABNORMAL HIGH (ref <200)
HDL: 60 mg/dL (ref >=50)
LDL Cholesterol (Calc): 159 mg/dL (calc) — ABNORMAL HIGH
Non-HDL Cholesterol (Calc): 178 mg/dL (calc) — ABNORMAL HIGH (ref <130)
Total CHOL/HDL Ratio: 4 (calc) (ref <5.0)
Triglycerides: 84 mg/dL (ref <150)

## 2020-02-26 LAB — CBC WITH DIFFERENTIAL/PLATELET
Absolute Monocytes: 327 cells/uL (ref 200–950)
Basophils Absolute: 19 cells/uL (ref 0–200)
Basophils Relative: 0.5 %
Eosinophils Absolute: 110 cells/uL (ref 15–500)
Eosinophils Relative: 2.9 %
HCT: 39.1 % (ref 35.0–45.0)
Hemoglobin: 12.5 g/dL (ref 11.7–15.5)
Lymphs Abs: 1896 cells/uL (ref 850–3900)
MCH: 28.9 pg (ref 27.0–33.0)
MCHC: 32 g/dL (ref 32.0–36.0)
MCV: 90.5 fL (ref 80.0–100.0)
MPV: 10.7 fL (ref 7.5–12.5)
Monocytes Relative: 8.6 %
Neutro Abs: 1448 cells/uL — ABNORMAL LOW (ref 1500–7800)
Neutrophils Relative %: 38.1 %
Platelets: 131 10*3/uL — ABNORMAL LOW (ref 140–400)
RBC: 4.32 10*6/uL (ref 3.80–5.10)
RDW: 13.3 % (ref 11.0–15.0)
Total Lymphocyte: 49.9 %
WBC: 3.8 10*3/uL (ref 3.8–10.8)

## 2020-02-26 LAB — THYROID PANEL WITH TSH
Free Thyroxine Index: 2.3 (ref 1.4–3.8)
T3 Uptake: 29 % (ref 22–35)
T4, Total: 7.8 ug/dL (ref 5.1–11.9)
TSH: 3.56 mIU/L (ref 0.40–4.50)

## 2020-02-27 ENCOUNTER — Other Ambulatory Visit: Payer: Self-pay | Admitting: Family Medicine

## 2020-02-27 DIAGNOSIS — E876 Hypokalemia: Secondary | ICD-10-CM

## 2020-02-27 DIAGNOSIS — D696 Thrombocytopenia, unspecified: Secondary | ICD-10-CM

## 2020-02-28 ENCOUNTER — Other Ambulatory Visit: Payer: Self-pay | Admitting: Family Medicine

## 2020-02-28 DIAGNOSIS — E782 Mixed hyperlipidemia: Secondary | ICD-10-CM

## 2020-02-28 MED ORDER — ATORVASTATIN CALCIUM 40 MG PO TABS: 40.0000 mg | ORAL_TABLET | Freq: Every day | ORAL | 3 refills | Status: DC

## 2020-03-05 ENCOUNTER — Encounter: Payer: Self-pay | Admitting: Family Medicine

## 2020-03-05 NOTE — Progress Notes (Signed)
Jury duty IT sales professional.

## 2020-03-06 ENCOUNTER — Other Ambulatory Visit: Payer: Self-pay

## 2020-03-06 LAB — COMPLETE METABOLIC PANEL WITH GFR
AG Ratio: 1.3 (calc) (ref 1.0–2.5)
ALT: 12 U/L (ref 6–29)
AST: 16 U/L (ref 10–35)
Albumin: 3.6 g/dL (ref 3.6–5.1)
Alkaline phosphatase (APISO): 76 U/L (ref 37–153)
BUN/Creatinine Ratio: 14 (calc) (ref 6–22)
BUN: 14 mg/dL (ref 7–25)
CO2: 31 mmol/L (ref 20–32)
Calcium: 9.1 mg/dL (ref 8.6–10.4)
Chloride: 105 mmol/L (ref 98–110)
Creat: 0.97 mg/dL — ABNORMAL HIGH (ref 0.60–0.93)
GFR, Est African American: 67 mL/min/{1.73_m2} (ref >=60)
GFR, Est Non African American: 58 mL/min/{1.73_m2} — ABNORMAL LOW (ref >=60)
Globulin: 2.8 g/dL (calc) (ref 1.9–3.7)
Glucose, Bld: 85 mg/dL (ref 65–99)
Potassium: 3.6 mmol/L (ref 3.5–5.3)
Sodium: 142 mmol/L (ref 135–146)
Total Bilirubin: 0.3 mg/dL (ref 0.2–1.2)
Total Protein: 6.4 g/dL (ref 6.1–8.1)

## 2020-03-06 LAB — CBC WITH DIFFERENTIAL/PLATELET
Absolute Monocytes: 400 cells/uL (ref 200–950)
Basophils Absolute: 28 cells/uL (ref 0–200)
Basophils Relative: 0.6 %
Eosinophils Absolute: 132 cells/uL (ref 15–500)
Eosinophils Relative: 2.8 %
HCT: 35.6 % (ref 35.0–45.0)
Hemoglobin: 11.5 g/dL — ABNORMAL LOW (ref 11.7–15.5)
Lymphs Abs: 1603 cells/uL (ref 850–3900)
MCH: 29.3 pg (ref 27.0–33.0)
MCHC: 32.3 g/dL (ref 32.0–36.0)
MCV: 90.8 fL (ref 80.0–100.0)
MPV: 10.1 fL (ref 7.5–12.5)
Monocytes Relative: 8.5 %
Neutro Abs: 2538 cells/uL (ref 1500–7800)
Neutrophils Relative %: 54 %
Platelets: 181 10*3/uL (ref 140–400)
RBC: 3.92 10*6/uL (ref 3.80–5.10)
RDW: 13.4 % (ref 11.0–15.0)
Total Lymphocyte: 34.1 %
WBC: 4.7 10*3/uL (ref 3.8–10.8)

## 2020-03-06 LAB — URINALYSIS, ROUTINE W REFLEX MICROSCOPIC
Bilirubin Urine: NEGATIVE
Glucose, UA: NEGATIVE
Hyaline Cast: NONE SEEN /LPF
Ketones, ur: NEGATIVE
Nitrite: POSITIVE — AB
Protein, ur: NEGATIVE
RBC / HPF: NONE SEEN /HPF (ref 0–2)
Specific Gravity, Urine: 1.015 (ref 1.001–1.03)
WBC, UA: >=60 /HPF — AB (ref 0–5)
pH: 7 (ref 5.0–8.0)

## 2020-03-13 ENCOUNTER — Telehealth: Payer: Self-pay

## 2020-03-13 DIAGNOSIS — R8290 Unspecified abnormal findings in urine: Secondary | ICD-10-CM

## 2020-03-13 NOTE — Telephone Encounter (Signed)
Urine culture placed due to abnormal urinalysis.

## 2020-03-15 LAB — URINE CULTURE
MICRO NUMBER:: 10571291
SPECIMEN QUALITY:: ADEQUATE

## 2020-03-18 ENCOUNTER — Other Ambulatory Visit: Payer: Self-pay | Admitting: Family Medicine

## 2020-03-18 DIAGNOSIS — N3000 Acute cystitis without hematuria: Secondary | ICD-10-CM

## 2020-03-18 MED ORDER — NITROFURANTOIN MONOHYD MACRO 100 MG PO CAPS: 100.0000 mg | ORAL_CAPSULE | Freq: Two times a day (BID) | ORAL | 0 refills | Status: AC

## 2020-05-07 ENCOUNTER — Telehealth: Payer: Self-pay

## 2020-05-07 NOTE — Telephone Encounter (Signed)
Copied from [place] [id number]. Topic: General - Inquiry >> May 06, 2020  3:37 PM Alease Frame wrote: Reason for CRM: Pt is returning call from office no message or VM was left . Please advise   I spoke with the patient and scheduled a f/u appt for 08/19/2020.

## 2020-05-29 ENCOUNTER — Other Ambulatory Visit: Payer: Self-pay | Admitting: Family Medicine

## 2020-05-29 DIAGNOSIS — F411 Generalized anxiety disorder: Secondary | ICD-10-CM

## 2020-05-29 DIAGNOSIS — F5104 Psychophysiologic insomnia: Secondary | ICD-10-CM

## 2020-05-29 NOTE — Telephone Encounter (Signed)
Requested medication (s) are due for refill today: yes  Requested medication (s) are on the active medication list: yes  Last refill:  02/19/20 #90 2 refills  Future visit scheduled: yes   Notes to clinic:  not delegated per protocol     Requested Prescriptions  Pending Prescriptions Disp Refills   ALPRAZolam (XANAX) 0.5 MG tablet [Pharmacy Med Name: ALPRAZOLAM 0.5 MG TABLET] 90 tablet     Sig: Take 1 tablet (0.5 mg total) by mouth 3 (three) times daily as needed for anxiety.      Not Delegated - Psychiatry:  Anxiolytics/Hypnotics Failed - 05/29/2020  1:38 PM      Failed - This refill cannot be delegated      Failed - Urine Drug Screen completed in last 360 days.      Passed - Valid encounter within last 6 months    Recent Outpatient Visits           3 months ago Essential hypertension   [facility], Lupita Raider, FNP   6 months ago BMI 40.0-44.9, adult [facility])   [facility] Olin Hauser, DO   9 months ago GAD (generalized anxiety disorder)   [place], DO   10 months ago Left hip pain   [facility] [employer], NP   12 months ago Depression with anxiety   [facility] Merrilyn Puma, Jerrel Ivory, NP       Future Appointments             In 2 months [place], Lupita Raider, [facility], [facility]

## 2020-05-30 NOTE — Telephone Encounter (Signed)
Personally reviewed Chelsea [name] today, 05/30/20.  According to PMP aware, pt last received a refill on 04/29/2020.  Refill of her controlled substance will be provided today.

## 2020-07-01 ENCOUNTER — Ambulatory Visit: Payer: Self-pay | Admitting: Family Medicine

## 2020-07-01 ENCOUNTER — Other Ambulatory Visit: Payer: Self-pay | Admitting: Family Medicine

## 2020-07-01 DIAGNOSIS — F5104 Psychophysiologic insomnia: Secondary | ICD-10-CM

## 2020-07-01 DIAGNOSIS — F411 Generalized anxiety disorder: Secondary | ICD-10-CM

## 2020-07-01 MED ORDER — ALPRAZOLAM 0.5 MG PO TABS: 0.5000 mg | ORAL_TABLET | Freq: Three times a day (TID) | ORAL | 2 refills | Status: DC | PRN

## 2020-07-01 NOTE — Telephone Encounter (Signed)
Requested medication (s) are due for refill today NO, not until 07/30/20.  Requested medication (s) are on the active medication list YES  Future visit scheduled Yes 07/09/20  Note to clinic-This medication is not delegated for [facility] to refill.   Requested Prescriptions  Pending Prescriptions Disp Refills   ALPRAZolam (XANAX) 0.5 MG tablet 90 tablet 2    Sig: Take 1 tablet (0.5 mg total) by mouth 3 (three) times daily as needed for anxiety.      Not Delegated - Psychiatry:  Anxiolytics/Hypnotics Failed - 07/01/2020  9:37 AM      Failed - This refill cannot be delegated      Failed - Urine Drug Screen completed in last 360 days.      Passed - Valid encounter within last 6 months    Recent Outpatient Visits           4 months ago Essential hypertension   [place], FNP   7 months ago BMI 40.0-44.9, adult [facility])   [place], DO   10 months ago GAD (generalized anxiety disorder)   [place], DO   11 months ago Left hip pain   [facility] [employer], NP   1 year ago Depression with anxiety   [facility] Merrilyn Puma, Jerrel Ivory, NP       Future Appointments             In 1 week [place], Lupita Raider, [facility], [facility]

## 2020-07-01 NOTE — Telephone Encounter (Signed)
Personally reviewed Chelsea [name] today, 07/01/20.  According to PMP aware, pt last received a refill on 05/30/2020.  Refill of her controlled substance will be provided today.

## 2020-07-01 NOTE — Telephone Encounter (Signed)
Medication: ALPRAZolam (XANAX) 0.5 MG tablet [[id number]]  Has the patient contacted their pharmacy? YES (Agent: If no, request that the patient contact the pharmacy for the refill.) (Agent: If yes, when and what did the pharmacy advise?)  Preferred Pharmacy (with phone number or street name): [id number] [address], [place] - [address] [place] [address] Phone: [phone] Fax: [phone] Hours: Not open 24 hours    Agent: Please be advised that RX refills may take up to 3 business days. We ask that you follow-up with your pharmacy.

## 2020-07-09 ENCOUNTER — Ambulatory Visit: Payer: Medicare Other | Admitting: Family Medicine

## 2020-07-09 ENCOUNTER — Encounter: Payer: Self-pay | Admitting: Family Medicine

## 2020-07-09 ENCOUNTER — Other Ambulatory Visit: Payer: Self-pay

## 2020-07-09 ENCOUNTER — Ambulatory Visit (INDEPENDENT_AMBULATORY_CARE_PROVIDER_SITE_OTHER): Payer: Medicare Other | Admitting: Family Medicine

## 2020-07-09 VITALS — BP 108/56 | HR 78 | Temp 98.5°F | Ht <= 58 in

## 2020-07-09 DIAGNOSIS — N390 Urinary tract infection, site not specified: Secondary | ICD-10-CM

## 2020-07-09 DIAGNOSIS — R29898 Other symptoms and signs involving the musculoskeletal system: Secondary | ICD-10-CM

## 2020-07-09 DIAGNOSIS — R8290 Unspecified abnormal findings in urine: Secondary | ICD-10-CM | POA: Insufficient documentation

## 2020-07-09 DIAGNOSIS — R413 Other amnesia: Secondary | ICD-10-CM | POA: Diagnosis not present

## 2020-07-09 LAB — POCT URINALYSIS DIPSTICK
Bilirubin, UA: NEGATIVE
Blood, UA: NEGATIVE
Glucose, UA: NEGATIVE
Nitrite, UA: NEGATIVE
Protein, UA: NEGATIVE
Spec Grav, UA: 1.025 (ref 1.010–1.025)
Urobilinogen, UA: 0.2 E.U./dL
pH, UA: 6.5 (ref 5.0–8.0)

## 2020-07-09 MED ORDER — SULFAMETHOXAZOLE-TRIMETHOPRIM 800-160 MG PO TABS: 1.0000 | ORAL_TABLET | Freq: Two times a day (BID) | ORAL | 0 refills | Status: AC

## 2020-07-09 NOTE — Assessment & Plan Note (Signed)
See UTI w/o hematuria A/P

## 2020-07-09 NOTE — Progress Notes (Signed)
Subjective:    Patient ID: OLIVIAROSE PUNCH, female    DOB: [birth year], 74 y.o.   MRN: [id number]  JUNETTE BERNAT is a 74 y.o. female presenting on 07/09/2020 for Memory Loss   HPI  Ms. Kleinman presents to clinic for concerns with her memory.  Originally presented to clinic and was unsure what she was here for, reports it took her 1.5-2 hours to get to our clinic today.  Has concerns for nighttime urinary incontinence for undetermined amount of time.  Reports she does have a family history of alzheimer's dementia and does report concern that she may be having early signs of this.  Has not met with Neurology in the past.  Denies new headaches, visual changes, dizziness, lightheadedness, falls, feeling unsteady with walking.  Depression screen [facility] 2/9 11/23/2019 08/30/2019 07/13/2019  Decreased Interest 2 2 0  Down, Depressed, Hopeless 1 1 0  PHQ - 2 Score 3 3 0  Altered sleeping 1 1 0  Tired, decreased energy 0 2 0  Change in appetite 0 0 0  Feeling bad or failure about yourself  0 0 0  Trouble concentrating 2 3 0  Moving slowly or fidgety/restless 2 2 0  Suicidal thoughts 0 0 0  PHQ-9 Score 8 11 0  Difficult doing work/chores Not difficult at all Somewhat difficult -    Social History   Tobacco Use  . Smoking status: Never Smoker  . Smokeless tobacco: Never Used  Vaping Use  . Vaping Use: Never used  Substance Use Topics  . Alcohol use: Never  . Drug use: No    Review of Systems  Constitutional: Negative.   HENT: Negative.   Eyes: Negative.   Respiratory: Negative.   Cardiovascular: Negative.   Gastrointestinal: Negative.   Endocrine: Negative.   Genitourinary: Negative.   Musculoskeletal: Negative.   Skin: Negative.   Allergic/Immunologic: Negative.   Neurological: Positive for weakness and numbness. Negative for dizziness, tremors, seizures, syncope, facial asymmetry, speech difficulty, light-headedness and headaches.  Hematological: Negative.     Psychiatric/Behavioral: Negative.    Per HPI unless specifically indicated above     Objective:    BP (!) 108/56 (BP Location: Left Arm, Patient Position: Sitting, Cuff Size: Normal)   Pulse 78   Temp 98.5 F (36.9 C) (Oral)   Ht 4' 9"  (1.448 m)   BMI 40.81 kg/m   Wt Readings from Last 3 Encounters:  02/19/20 188 lb 9.6 oz (85.5 kg)  11/22/19 192 lb (87.1 kg)  07/27/19 180 lb (81.6 kg)    Physical Exam Vitals and nursing note reviewed.  Constitutional:      General: She is not in acute distress.    Appearance: Normal appearance. She is well-developed and well-groomed. She is not ill-appearing or toxic-appearing.  HENT:     Head: Normocephalic and atraumatic.     Nose:     Comments: Lizbeth Bark is in place, covering mouth and nose. Eyes:     General: Lids are normal. Vision grossly intact.        Right eye: No discharge.        Left eye: No discharge.     Extraocular Movements: Extraocular movements intact.     Conjunctiva/sclera: Conjunctivae normal.     Pupils: Pupils are equal, round, and reactive to light.  Cardiovascular:     Rate and Rhythm: Normal rate and regular rhythm.     Pulses: Normal pulses.          Dorsalis pedis  pulses are 2+ on the right side and 2+ on the left side.     Heart sounds: Normal heart sounds. No murmur heard.  No friction rub. No gallop.   Pulmonary:     Effort: Pulmonary effort is normal. No respiratory distress.     Breath sounds: Normal breath sounds.  Musculoskeletal:     Right lower leg: No edema.     Left lower leg: No edema.     Comments: Normal tone, strength 4/5 right upper and right lower extremity, 5/5 left upper and lower extremity  Skin:    General: Skin is warm and dry.     Capillary Refill: Capillary refill takes less than 2 seconds.  Neurological:     General: No focal deficit present.     Mental Status: She is alert.     Cranial Nerves: Cranial nerves are intact.     Sensory: Sensation is intact.     Motor: Weakness  present. No tremor or pronator drift.     Coordination: Coordination normal. Finger-Nose-Finger Test and Heel to [place] Test normal.     Gait: Gait is intact.     Deep Tendon Reflexes: Reflexes are normal and symmetric.     Comments: Oriented to time, place and person, unable to determine why she was in clinic at first.  Psychiatric:        Attention and Perception: Attention and perception normal.        Mood and Affect: Mood and affect normal.        Speech: Speech normal.        Behavior: Behavior normal. Behavior is cooperative.        Thought Content: Thought content normal.        Cognition and Memory: Cognition and memory normal.        Judgment: Judgment normal.    Results for orders placed or performed in visit on 07/09/20  POCT Urinalysis Dipstick  Result Value Ref Range   Color, UA yellow    Clarity, UA cloudy    Glucose, UA Negative Negative   Bilirubin, UA negative    Ketones, UA trace    Spec Grav, UA 1.025 1.010 - 1.025   Blood, UA negative    pH, UA 6.5 5.0 - 8.0   Protein, UA Negative Negative   Urobilinogen, UA 0.2 0.2 or 1.0 E.U./dL   Nitrite, UA negative    Leukocytes, UA Large (3+) (A) Negative   Appearance     Odor        Assessment & Plan:   Problem List Items Addressed This Visit      Nervous and Auditory   Right arm weakness    Reports has been present for many years with weakness, numbness and tingling, is progressing slowly, has not met with Neurology in the past and is interested in a referral.  Plan: 1. Referral to Neurology placed.      Relevant Orders   Ambulatory referral to Neurology   Right leg weakness    Reports has been present for many years with weakness, numbness and tingling, is progressing slowly, has not met with Neurology in the past and is interested in a referral.  Plan: 1. Referral to Neurology placed.      Relevant Orders   Ambulatory referral to Neurology     Genitourinary   Urinary tract infection without  hematuria - Primary    POCT U/A completed due to concerns for memory impairment.  POCT U/A showing probable  UTI.  Will send to the lab for culture and start on empiric antibiotics.  To begin Bactrim DS, 1 tablet 2x per day for the next 5 days.  Plan: 1. Begin bactrim DS, 1 tablet 2x per day for the next 5 days 2. Urine sent to the lab for culture 3. RTC if symptoms worsen or fail to improve      Relevant Medications   sulfamethoxazole-trimethoprim (BACTRIM DS) 800-160 MG tablet     Other   Abnormal urinalysis    See UTI w/o hematuria A/P      Relevant Orders   POCT Urinalysis Dipstick (Completed)   Memory loss    Reports she feels like she is 'blanking' on memory issues and past dates such as when her Mother passed away.  Took patient > 1.5 hours to get to our clinic visit today, when typically takes less than 30 minutes.  Had gotten lost along the way a few times.  States she has noticed she has been blanking, discussed may be related to her UTI, but will put in a referral to Neurology, as she has been having arm/leg nerve symptoms.  Plan: 1. Referral to neurology placed 2. Re-evaluate memory once antibiotics are compelted 3. RTC if symptoms worsen or fail to improve      Relevant Orders   Ambulatory referral to Neurology      Meds ordered this encounter  Medications  . sulfamethoxazole-trimethoprim (BACTRIM DS) 800-160 MG tablet    Sig: Take 1 tablet by mouth 2 (two) times daily for 5 days.    Dispense:  10 tablet    Refill:  0    Follow up plan: Return in about 4 weeks (around 08/06/2020) for Numbness/Weakness right arm & leg, memory issues.   Harlin Rain, [place] Family Nurse Practitioner [facility] 07/09/2020, 4:47 PM

## 2020-07-09 NOTE — Assessment & Plan Note (Signed)
Reports she feels like she is 'blanking' on memory issues and past dates such as when her Mother passed away.  Took patient > 1.5 hours to get to our clinic visit today, when typically takes less than 30 minutes.  Had gotten lost along the way a few times.  States she has noticed she has been blanking, discussed may be related to her UTI, but will put in a referral to Neurology, as she has been having arm/leg nerve symptoms.  Plan: 1. Referral to neurology placed 2. Re-evaluate memory once antibiotics are compelted 3. RTC if symptoms worsen or fail to improve

## 2020-07-09 NOTE — Patient Instructions (Signed)
I have sent in an antibiotic, Bactrim DS, to take 1 tablet twice a day for the next 5 days  We have sent your urine to the lab for culture and we will call you when we receive the results  A referral to Neurology has been placed today.  If you have not heard from the specialty office or our referral coordinator within 1 week, please let us know and we will follow up with the referral coordinator for an update.  We will plan to see you back in 4 weeks for memory loss and right arm/leg numbness  You will receive a survey after today's visit either digitally by e-mail or paper by [place] mail. Your experiences and feedback matter to [place].  Please respond so we know how we are doing as we provide care for you.  Call us with any questions/concerns/needs.  It is my goal to be available to you for your health concerns.  Thanks for choosing me to be a partner in your healthcare needs!  Harlin Rain, FNP-C Family Nurse Practitioner [facility] Phone: [phone]

## 2020-07-09 NOTE — Addendum Note (Signed)
Addended by: Wilson Singer on: 07/09/2020 05:06 PM   Modules accepted: Orders

## 2020-07-09 NOTE — Assessment & Plan Note (Signed)
Reports has been present for many years with weakness, numbness and tingling, is progressing slowly, has not met with Neurology in the past and is interested in a referral.  Plan: 1. Referral to Neurology placed. 

## 2020-07-09 NOTE — Assessment & Plan Note (Signed)
POCT U/A completed due to concerns for memory impairment.  POCT U/A showing probable UTI.  Will send to the lab for culture and start on empiric antibiotics.  To begin Bactrim DS, 1 tablet 2x per day for the next 5 days.  Plan: 1. Begin bactrim DS, 1 tablet 2x per day for the next 5 days 2. Urine sent to the lab for culture 3. RTC if symptoms worsen or fail to improve

## 2020-07-10 LAB — URINE CULTURE
MICRO NUMBER:: 11033275
Result:: NO GROWTH
SPECIMEN QUALITY:: ADEQUATE

## 2020-07-19 ENCOUNTER — Other Ambulatory Visit: Payer: Self-pay | Admitting: Family Medicine

## 2020-07-19 DIAGNOSIS — Z1231 Encounter for screening mammogram for malignant neoplasm of breast: Secondary | ICD-10-CM

## 2020-08-06 ENCOUNTER — Ambulatory Visit: Payer: Medicare Other | Admitting: Family Medicine

## 2020-08-08 ENCOUNTER — Other Ambulatory Visit: Payer: Self-pay | Admitting: Family Medicine

## 2020-08-08 DIAGNOSIS — F411 Generalized anxiety disorder: Secondary | ICD-10-CM

## 2020-08-08 DIAGNOSIS — I10 Essential (primary) hypertension: Secondary | ICD-10-CM

## 2020-08-08 DIAGNOSIS — M25511 Pain in right shoulder: Secondary | ICD-10-CM | POA: Insufficient documentation

## 2020-08-08 DIAGNOSIS — F5104 Psychophysiologic insomnia: Secondary | ICD-10-CM

## 2020-08-08 DIAGNOSIS — R200 Anesthesia of skin: Secondary | ICD-10-CM | POA: Insufficient documentation

## 2020-08-13 ENCOUNTER — Other Ambulatory Visit: Payer: Self-pay

## 2020-08-13 ENCOUNTER — Ambulatory Visit (INDEPENDENT_AMBULATORY_CARE_PROVIDER_SITE_OTHER): Payer: Medicare Other | Admitting: Family Medicine

## 2020-08-13 ENCOUNTER — Encounter: Payer: Self-pay | Admitting: Family Medicine

## 2020-08-13 VITALS — BP 129/51 | HR 67 | Temp 98.5°F | Ht <= 58 in | Wt 185.0 lb

## 2020-08-13 DIAGNOSIS — R413 Other amnesia: Secondary | ICD-10-CM

## 2020-08-13 DIAGNOSIS — R29898 Other symptoms and signs involving the musculoskeletal system: Secondary | ICD-10-CM

## 2020-08-13 DIAGNOSIS — I10 Essential (primary) hypertension: Secondary | ICD-10-CM

## 2020-08-13 DIAGNOSIS — F4489 Other dissociative and conversion disorders: Secondary | ICD-10-CM

## 2020-08-13 LAB — POCT URINALYSIS DIPSTICK
Bilirubin, UA: NEGATIVE
Blood, UA: NEGATIVE
Glucose, UA: NEGATIVE
Ketones, UA: NEGATIVE
Leukocytes, UA: NEGATIVE
Nitrite, UA: NEGATIVE
Protein, UA: NEGATIVE
Spec Grav, UA: 1.015 (ref 1.010–1.025)
Urobilinogen, UA: 0.2 E.U./dL
pH, UA: 6 (ref 5.0–8.0)

## 2020-08-13 NOTE — Patient Instructions (Signed)
Have your labs drawn and we will contact you with the results.  Keep your upcoming appointment with Dr. Melrose Nakayama (Neurology).

## 2020-08-14 NOTE — Assessment & Plan Note (Signed)
Controlled hypertension.  BP is at goal < 130/80.  Pt is not working on lifestyle modifications.  Taking medications tolerating well without side effects.  Complications:  Morbid obesity, HLD, impaired mobility Plan: 1. Continue taking triamterene-HCTZ 37.5-25mg  daily 2. Obtain labs today  3. Encouraged heart healthy diet and increasing exercise to 30 minutes most days of the week, going no more than 2 days in a row without exercise. 4. Check BP 1-2 x per week at home, keep log, and bring to clinic at next appointment. 5. Follow up 3 months.

## 2020-08-14 NOTE — Assessment & Plan Note (Signed)
Continued memory difficulties.  Is currently following with Dr. Melrose Nakayama.  Reports her memory has improved from last visit.  To keep upcoming appointment with Dr. Melrose Nakayama, Neurology, 09/05/2020

## 2020-08-14 NOTE — Assessment & Plan Note (Addendum)
Currently following with Dr. Melrose Nakayama, Neurology.  Has upcoming appointment on 11/22 for EMG and follow up with Dr. Sharmon Leyden on 09/05/2020.  No acute concerns, discussed could have cancelled today's visit, was scheduled in case she had not yet established with Neurology.  Continue f/u with neurology and all scheduled testing.

## 2020-08-14 NOTE — Progress Notes (Signed)
Subjective:    Patient ID: Chelsea [name], female    DOB: 1946/07/03, 74 y.o.   MRN: [id number]  Chelsea [name] is a 74 y.o. female presenting on 08/13/2020 for Numbness (Patient Complaining of numbness and tingling in right and arm leg. Feels weakness as well. Says she has spinal stenosis and this is her disability. Made an appointment for neurology with Dr Melrose Nakayama Dec 2nd and has a schedule electromyography on Nov 22nd at [facility].)   HPI  [name] presents to clinic for a follow up on her numbness/tingling in her right arm and leg.  Reports she has established with Dr. Melrose Nakayama from Neurology and currently has upcoming testing.  States she has no acute concerns today.  Depression screen [facility] 2/9 08/13/2020 11/23/2019 08/30/2019  Decreased Interest 0 2 2  Down, Depressed, Hopeless 0 1 1  PHQ - 2 Score 0 3 3  Altered sleeping 0 1 1  Tired, decreased energy 0 0 2  Change in appetite 0 0 0  Feeling bad or failure about yourself  0 0 0  Trouble concentrating 0 2 3  Moving slowly or fidgety/restless 0 2 2  Suicidal thoughts 0 0 0  PHQ-9 Score 0 8 11  Difficult doing work/chores Not difficult at all Not difficult at all Somewhat difficult    Social History   Tobacco Use  . Smoking status: Never Smoker  . Smokeless tobacco: Never Used  Vaping Use  . Vaping Use: Never used  Substance Use Topics  . Alcohol use: Never  . Drug use: No    Review of Systems  Constitutional: Negative.   HENT: Negative.   Eyes: Negative.   Respiratory: Negative.   Cardiovascular: Negative.   Gastrointestinal: Negative.   Endocrine: Negative.   Genitourinary: Negative.   Musculoskeletal: Negative.   Skin: Negative.   Allergic/Immunologic: Negative.   Neurological: Positive for weakness. Negative for dizziness, tremors, seizures, syncope, facial asymmetry, speech difficulty, light-headedness, numbness and headaches.  Hematological: Negative.   Psychiatric/Behavioral: Negative.    Per HPI unless  specifically indicated above     Objective:    BP (!) 129/51   Pulse 67   Temp 98.5 F (36.9 C) (Oral)   Ht 4\' 9"  (1.448 m)   Wt 185 lb (83.9 kg)   SpO2 98%   BMI 40.03 kg/m   Wt Readings from Last 3 Encounters:  08/13/20 185 lb (83.9 kg)  02/19/20 188 lb 9.6 oz (85.5 kg)  11/22/19 192 lb (87.1 kg)    Physical Exam Vitals and nursing note reviewed.  Constitutional:      General: She is not in acute distress.    Appearance: Normal appearance. She is well-developed and well-groomed. She is not ill-appearing or toxic-appearing.  HENT:     Head: Normocephalic and atraumatic.     Nose:     Comments: Chelsea [name] is in place, covering mouth and nose. Eyes:     General: Lids are normal. Vision grossly intact.        Right eye: No discharge.        Left eye: No discharge.     Extraocular Movements: Extraocular movements intact.     Conjunctiva/sclera: Conjunctivae normal.     Pupils: Pupils are equal, round, and reactive to light.  Cardiovascular:     Rate and Rhythm: Normal rate and regular rhythm.     Pulses: Normal pulses.          Dorsalis pedis pulses are 2+ on the right  side and 2+ on the left side.     Heart sounds: Normal heart sounds. No murmur heard.  No friction rub. No gallop.   Pulmonary:     Effort: Pulmonary effort is normal. No respiratory distress.     Breath sounds: Normal breath sounds.  Musculoskeletal:     Right lower leg: No edema.     Left lower leg: No edema.     Comments: Normal tone, strength 4/5 right upper and right lower extremity, 5/5 left upper and lower extremity  Skin:    General: Skin is warm and dry.     Capillary Refill: Capillary refill takes less than 2 seconds.  Neurological:     General: No focal deficit present.     Mental Status: She is alert.     Cranial Nerves: Cranial nerves are intact.     Sensory: Sensation is intact.     Motor: Weakness present. No tremor or pronator drift.     Coordination: Coordination normal.  Finger-Nose-Finger Test and Heel to [place] Test normal.     Gait: Gait is intact.     Deep Tendon Reflexes: Reflexes are normal and symmetric.     Comments: Oriented to time, place and person, unable to determine why she was in clinic at first.  Psychiatric:        Attention and Perception: Attention and perception normal.        Mood and Affect: Mood and affect normal.        Speech: Speech normal.        Behavior: Behavior normal. Behavior is cooperative.        Thought Content: Thought content normal.        Cognition and Memory: Cognition and memory normal.        Judgment: Judgment normal.    Results for orders placed or performed in visit on 08/13/20  CBC with Differential  Result Value Ref Range   WBC 4.2 3.8 - 10.8 Thousand/uL   RBC 4.06 3.80 - 5.10 Million/uL   Hemoglobin 11.6 (L) 11.7 - 15.5 g/dL   HCT 37.0 35 - 45 %   MCV 91.1 80.0 - 100.0 fL   MCH 28.6 27.0 - 33.0 pg   MCHC 31.4 (L) 32.0 - 36.0 g/dL   RDW 12.6 11.0 - 15.0 %   Platelets 218 140 - 400 Thousand/uL   MPV 9.9 7.5 - 12.5 fL   Neutro Abs 2,142 1,500 - 7,800 cells/uL   Lymphs Abs 1,491 850 - 3,900 cells/uL   Absolute Monocytes 428 200 - 950 cells/uL   Eosinophils Absolute 109 15.0 - 500.0 cells/uL   Basophils Absolute 29 0.0 - 200.0 cells/uL   Neutrophils Relative % 51 %   Total Lymphocyte 35.5 %   Monocytes Relative 10.2 %   Eosinophils Relative 2.6 %   Basophils Relative 0.7 %  COMPLETE METABOLIC PANEL WITH GFR  Result Value Ref Range   Glucose, Bld 87 65 - 99 mg/dL   BUN 16 7 - 25 mg/dL   Creat 0.87 0.60 - 0.93 mg/dL   GFR, Est Non African American 65 > OR = 60 mL/min/1.61m2   GFR, Est African American 76 > OR = 60 mL/min/1.68m2   BUN/Creatinine Ratio NOT APPLICABLE 6 - 22 (calc)   Sodium 141 135 - 146 mmol/L   Potassium 3.7 3.5 - 5.3 mmol/L   Chloride 103 98 - 110 mmol/L   CO2 33 (H) 20 - 32 mmol/L   Calcium 9.3 8.6 -  10.4 mg/dL   Total Protein 6.5 6.1 - 8.1 g/dL   Albumin 3.8 3.6 - 5.1 g/dL     Globulin 2.7 1.9 - 3.7 g/dL (calc)   AG Ratio 1.4 1.0 - 2.5 (calc)   Total Bilirubin 0.5 0.2 - 1.2 mg/dL   Alkaline phosphatase (APISO) 78 37 - 153 U/L   AST 18 10 - 35 U/L   ALT 17 6 - 29 U/L  TSH + free T4  Result Value Ref Range   TSH W/REFLEX TO FT4 1.66 0.40 - 4.50 mIU/L  POCT Urinalysis Dipstick  Result Value Ref Range   Color, UA yellow    Clarity, UA clear    Glucose, UA Negative Negative   Bilirubin, UA neg    Ketones, UA neg    Spec Grav, UA 1.015 1.010 - 1.025   Blood, UA neg    pH, UA 6.0 5.0 - 8.0   Protein, UA Negative Negative   Urobilinogen, UA 0.2 0.2 or 1.0 E.U./dL   Nitrite, UA neg    Leukocytes, UA Negative Negative   Appearance clear    Odor none       Assessment & Plan:   Problem List Items Addressed This Visit      Cardiovascular and Mediastinum   Essential hypertension    Controlled hypertension.  BP is at goal < 130/80.  Pt is not working on lifestyle modifications.  Taking medications tolerating well without side effects.  Complications:  Morbid obesity, HLD, impaired mobility Plan: 1. Continue taking triamterene-HCTZ 37.5-25mg  daily 2. Obtain labs today  3. Encouraged heart healthy diet and increasing exercise to 30 minutes most days of the week, going no more than 2 days in a row without exercise. 4. Check BP 1-2 x per week at home, keep log, and bring to clinic at next appointment. 5. Follow up 3 months.       Relevant Orders   CBC with Differential (Completed)   COMPLETE METABOLIC PANEL WITH GFR (Completed)     Nervous and Auditory   Right arm weakness - Primary    Currently following with Dr. Melrose Nakayama, Neurology.  Has upcoming appointment on 11/22 for EMG and follow up with Dr. Sharmon Leyden on 09/05/2020.  No acute concerns, discussed could have cancelled today's visit, was scheduled in case she had not yet established with Neurology.  Continue f/u with neurology and all scheduled testing.      Right leg weakness    Currently following  with Dr. Melrose Nakayama, Neurology.  Has upcoming appointment on 11/22 for EMG and follow up with Dr. Sharmon Leyden on 09/05/2020.  No acute concerns, discussed could have cancelled today's visit, was scheduled in case she had not yet established with Neurology.  Continue f/u with neurology and all scheduled testing.        Other   Memory difficulties    Continued memory difficulties.  Is currently following with Dr. Melrose Nakayama.  Reports her memory has improved from last visit.  To keep upcoming appointment with Dr. Melrose Nakayama, Neurology, 09/05/2020      Relevant Orders   POCT Urinalysis Dipstick (Completed)   TSH + free T4 (Completed)   Heavy Metals Panel, Blood      No orders of the defined types were placed in this encounter.   Follow up plan: Return in about 3 months (around 11/13/2020).   Harlin Rain, [place] Family Nurse Practitioner [facility] 08/13/2020, 3:58 PM

## 2020-08-14 NOTE — Assessment & Plan Note (Signed)
Currently following with Dr. Melrose Nakayama, Neurology.  Has upcoming appointment on 11/22 for EMG and follow up with Dr. Sharmon Leyden on 09/05/2020.  No acute concerns, discussed could have cancelled today's visit, was scheduled in case she had not yet established with Neurology.  Continue f/u with neurology and all scheduled testing.

## 2020-08-15 LAB — CBC WITH DIFFERENTIAL/PLATELET
Absolute Monocytes: 428 cells/uL (ref 200–950)
Basophils Absolute: 29 cells/uL (ref 0–200)
Basophils Relative: 0.7 %
Eosinophils Absolute: 109 cells/uL (ref 15–500)
Eosinophils Relative: 2.6 %
HCT: 37 % (ref 35.0–45.0)
Hemoglobin: 11.6 g/dL — ABNORMAL LOW (ref 11.7–15.5)
Lymphs Abs: 1491 cells/uL (ref 850–3900)
MCH: 28.6 pg (ref 27.0–33.0)
MCHC: 31.4 g/dL — ABNORMAL LOW (ref 32.0–36.0)
MCV: 91.1 fL (ref 80.0–100.0)
MPV: 9.9 fL (ref 7.5–12.5)
Monocytes Relative: 10.2 %
Neutro Abs: 2142 cells/uL (ref 1500–7800)
Neutrophils Relative %: 51 %
Platelets: 218 10*3/uL (ref 140–400)
RBC: 4.06 10*6/uL (ref 3.80–5.10)
RDW: 12.6 % (ref 11.0–15.0)
Total Lymphocyte: 35.5 %
WBC: 4.2 10*3/uL (ref 3.8–10.8)

## 2020-08-15 LAB — COMPLETE METABOLIC PANEL WITH GFR
AG Ratio: 1.4 (calc) (ref 1.0–2.5)
ALT: 17 U/L (ref 6–29)
AST: 18 U/L (ref 10–35)
Albumin: 3.8 g/dL (ref 3.6–5.1)
Alkaline phosphatase (APISO): 78 U/L (ref 37–153)
BUN: 16 mg/dL (ref 7–25)
CO2: 33 mmol/L — ABNORMAL HIGH (ref 20–32)
Calcium: 9.3 mg/dL (ref 8.6–10.4)
Chloride: 103 mmol/L (ref 98–110)
Creat: 0.87 mg/dL (ref 0.60–0.93)
GFR, Est African American: 76 mL/min/{1.73_m2} (ref >=60)
GFR, Est Non African American: 65 mL/min/{1.73_m2} (ref >=60)
Globulin: 2.7 g/dL (calc) (ref 1.9–3.7)
Glucose, Bld: 87 mg/dL (ref 65–99)
Potassium: 3.7 mmol/L (ref 3.5–5.3)
Sodium: 141 mmol/L (ref 135–146)
Total Bilirubin: 0.5 mg/dL (ref 0.2–1.2)
Total Protein: 6.5 g/dL (ref 6.1–8.1)

## 2020-08-15 LAB — HEAVY METALS PANEL, BLOOD
Arsenic: <10 mcg/L (ref <23)
Lead: 2 ug/dL (ref <5)
Mercury, B: <5 mcg/L (ref 0–10)

## 2020-08-15 LAB — TSH+FREE T4: TSH W/REFLEX TO FT4: 1.66 mIU/L (ref 0.40–4.50)

## 2020-08-20 ENCOUNTER — Telehealth: Payer: Self-pay

## 2020-08-20 ENCOUNTER — Ambulatory Visit: Payer: Self-pay | Admitting: Family Medicine

## 2020-08-20 NOTE — Telephone Encounter (Signed)
Immunizations documented

## 2020-10-01 ENCOUNTER — Other Ambulatory Visit: Payer: Self-pay | Admitting: Family Medicine

## 2020-10-01 DIAGNOSIS — F411 Generalized anxiety disorder: Secondary | ICD-10-CM

## 2020-10-01 DIAGNOSIS — F5104 Psychophysiologic insomnia: Secondary | ICD-10-CM

## 2020-10-01 NOTE — Telephone Encounter (Signed)
Requested medication (s) are due for refill today: yes  Requested medication (s) are on the active medication list: yes  Last refill: 08/08/20  # 90  0 refills  Future visit scheduled: No  Notes to clinic:  Pharmacy requesting Dx code. 90 day refill.  Last fill states needs appointment . OV 08/13/20 no mention of this anxiety medication. Please review!    Requested Prescriptions  Pending Prescriptions Disp Refills   FLUoxetine (PROZAC) 20 MG capsule [Pharmacy Med Name: FLUOXETINE HCL 20 MG CAPSULE] 270 capsule 1    Sig: TAKE 3 CAPSULES BY MOUTH EVERY DAY      Psychiatry:  Antidepressants - SSRI Passed - 10/01/2020  2:32 PM      Passed - Completed PHQ-2 or PHQ-9 in the last 360 days      Passed - Valid encounter within last 6 months    Recent Outpatient Visits           1 month ago Right arm weakness   [facility] [address], Jodelle Gross, FNP   2 months ago Urinary tract infection without hematuria, site unspecified   [facility], Jodelle Gross, FNP   7 months ago Essential hypertension   [facility], Jodelle Gross, FNP   10 months ago BMI 40.0-44.9, adult [facility])   [facility] Smitty Cords, DO   1 year ago GAD (generalized anxiety disorder)   [facility], Netta Neat, DO

## 2020-10-06 ENCOUNTER — Other Ambulatory Visit: Payer: Self-pay | Admitting: Family Medicine

## 2020-10-06 DIAGNOSIS — I10 Essential (primary) hypertension: Secondary | ICD-10-CM

## 2020-10-21 ENCOUNTER — Encounter: Payer: Self-pay | Admitting: Emergency Medicine

## 2020-10-21 ENCOUNTER — Emergency Department: Payer: Medicare Other

## 2020-10-21 ENCOUNTER — Inpatient Hospital Stay
Admission: EM | Admit: 2020-10-21 | Discharge: 2020-10-24 | DRG: 564 | Disposition: A | Discharge status: Home Health | Payer: Medicare Other | Attending: Internal Medicine | Admitting: Internal Medicine

## 2020-10-21 ENCOUNTER — Other Ambulatory Visit: Payer: Self-pay

## 2020-10-21 DIAGNOSIS — F5104 Psychophysiologic insomnia: Secondary | ICD-10-CM

## 2020-10-21 DIAGNOSIS — E669 Obesity, unspecified: Secondary | ICD-10-CM | POA: Diagnosis present

## 2020-10-21 DIAGNOSIS — R569 Unspecified convulsions: Secondary | ICD-10-CM | POA: Diagnosis present

## 2020-10-21 DIAGNOSIS — M6282 Rhabdomyolysis: Secondary | ICD-10-CM | POA: Diagnosis not present

## 2020-10-21 DIAGNOSIS — F411 Generalized anxiety disorder: Secondary | ICD-10-CM

## 2020-10-21 DIAGNOSIS — E785 Hyperlipidemia, unspecified: Secondary | ICD-10-CM | POA: Diagnosis present

## 2020-10-21 DIAGNOSIS — W1830XA Fall on same level, unspecified, initial encounter: Secondary | ICD-10-CM | POA: Diagnosis present

## 2020-10-21 DIAGNOSIS — E7800 Pure hypercholesterolemia, unspecified: Secondary | ICD-10-CM | POA: Diagnosis present

## 2020-10-21 DIAGNOSIS — Z79899 Other long term (current) drug therapy: Secondary | ICD-10-CM

## 2020-10-21 DIAGNOSIS — Z8261 Family history of arthritis: Secondary | ICD-10-CM

## 2020-10-21 DIAGNOSIS — M1990 Unspecified osteoarthritis, unspecified site: Secondary | ICD-10-CM | POA: Diagnosis present

## 2020-10-21 DIAGNOSIS — I10 Essential (primary) hypertension: Secondary | ICD-10-CM | POA: Diagnosis not present

## 2020-10-21 DIAGNOSIS — Z880 Allergy status to penicillin: Secondary | ICD-10-CM

## 2020-10-21 DIAGNOSIS — Z8249 Family history of ischemic heart disease and other diseases of the circulatory system: Secondary | ICD-10-CM

## 2020-10-21 DIAGNOSIS — T796XXA Traumatic ischemia of muscle, initial encounter: Principal | ICD-10-CM | POA: Diagnosis present

## 2020-10-21 DIAGNOSIS — Z90710 Acquired absence of both cervix and uterus: Secondary | ICD-10-CM

## 2020-10-21 DIAGNOSIS — F419 Anxiety disorder, unspecified: Secondary | ICD-10-CM | POA: Diagnosis present

## 2020-10-21 DIAGNOSIS — Z86010 Personal history of colonic polyps: Secondary | ICD-10-CM

## 2020-10-21 DIAGNOSIS — F32A Depression, unspecified: Secondary | ICD-10-CM | POA: Diagnosis present

## 2020-10-21 DIAGNOSIS — E538 Deficiency of other specified B group vitamins: Secondary | ICD-10-CM | POA: Diagnosis present

## 2020-10-21 DIAGNOSIS — Z6838 Body mass index (BMI) 38.0-38.9, adult: Secondary | ICD-10-CM

## 2020-10-21 DIAGNOSIS — R531 Weakness: Secondary | ICD-10-CM | POA: Diagnosis not present

## 2020-10-21 DIAGNOSIS — W19XXXA Unspecified fall, initial encounter: Secondary | ICD-10-CM

## 2020-10-21 DIAGNOSIS — U071 COVID-19: Secondary | ICD-10-CM | POA: Diagnosis present

## 2020-10-21 DIAGNOSIS — Y92009 Unspecified place in unspecified non-institutional (private) residence as the place of occurrence of the external cause: Secondary | ICD-10-CM

## 2020-10-21 DIAGNOSIS — T796XXD Traumatic ischemia of muscle, subsequent encounter: Secondary | ICD-10-CM

## 2020-10-21 DIAGNOSIS — M4802 Spinal stenosis, cervical region: Secondary | ICD-10-CM | POA: Diagnosis present

## 2020-10-21 DIAGNOSIS — M479 Spondylosis, unspecified: Secondary | ICD-10-CM | POA: Diagnosis present

## 2020-10-21 DIAGNOSIS — Z7982 Long term (current) use of aspirin: Secondary | ICD-10-CM

## 2020-10-21 LAB — CBC
HCT: 38 % (ref 36.0–46.0)
Hemoglobin: 11.9 g/dL — ABNORMAL LOW (ref 12.0–15.0)
MCH: 29.7 pg (ref 26.0–34.0)
MCHC: 31.3 g/dL (ref 30.0–36.0)
MCV: 94.8 fL (ref 80.0–100.0)
Platelets: 233 10*3/uL (ref 150–400)
RBC: 4.01 MIL/uL (ref 3.87–5.11)
RDW: 13.9 % (ref 11.5–15.5)
WBC: 6 10*3/uL (ref 4.0–10.5)
nRBC: 0 % (ref 0.0–0.2)

## 2020-10-21 LAB — COMPREHENSIVE METABOLIC PANEL
ALT: 35 U/L (ref 0–44)
AST: 60 U/L — ABNORMAL HIGH (ref 15–41)
Albumin: 3.8 g/dL (ref 3.5–5.0)
Alkaline Phosphatase: 72 U/L (ref 38–126)
Anion gap: 13 (ref 5–15)
BUN: 22 mg/dL (ref 8–23)
CO2: 30 mmol/L (ref 22–32)
Calcium: 9.7 mg/dL (ref 8.9–10.3)
Chloride: 100 mmol/L (ref 98–111)
Creatinine, Ser: 0.77 mg/dL (ref 0.44–1.00)
GFR, Estimated: >60 mL/min (ref >60)
Glucose, Bld: 102 mg/dL — ABNORMAL HIGH (ref 70–99)
Potassium: 3.7 mmol/L (ref 3.5–5.1)
Sodium: 143 mmol/L (ref 135–145)
Total Bilirubin: 1.1 mg/dL (ref 0.3–1.2)
Total Protein: 7.7 g/dL (ref 6.5–8.1)

## 2020-10-21 LAB — TROPONIN I (HIGH SENSITIVITY): Troponin I (High Sensitivity): 13 ng/L (ref <18)

## 2020-10-21 LAB — CK: Total CK: 1600 U/L — ABNORMAL HIGH (ref 38–234)

## 2020-10-21 IMAGING — MR MR HEAD W/O CM
14 series · 39 of 48 positions shown · non-contrast
Comparison: Prior CT from earlier same day.

CLINICAL DATA: Initial evaluation for acute right-sided weakness.

EXAM:
MRI HEAD WITHOUT CONTRAST
TECHNIQUE: Multiplanar, multiecho pulse sequences of the brain and surrounding
structures were obtained without intravenous contrast.

[Series 5: ax dwi_tracew · axial · 3.0mm · 0.60mm/px · z∈[-93,+61]mm · 2 of 48 slices shown]
[im 1/48]
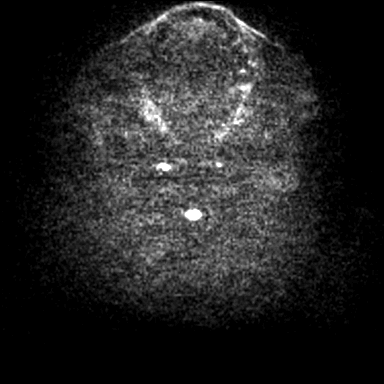
[im 48/48]
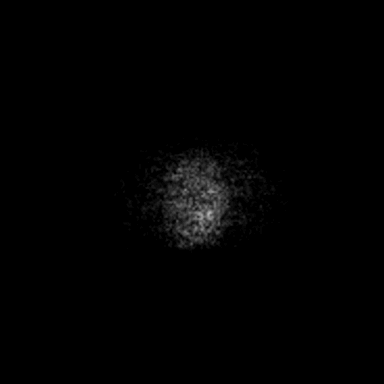

[Series 6: ax dwi_adc · axial · 3.0mm · 0.60mm/px · z∈[-93,+51]mm · 3 of 45 slices shown]
[im 1/45]
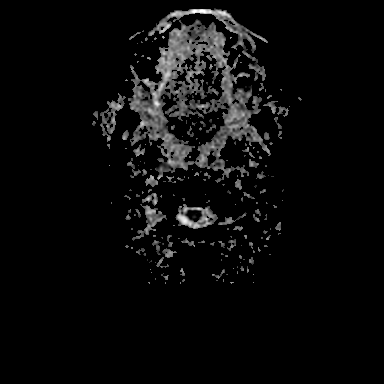
[im 23/45]
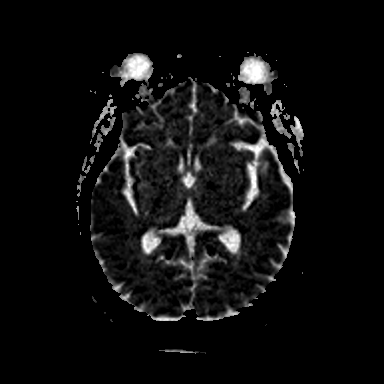
[im 45/45]
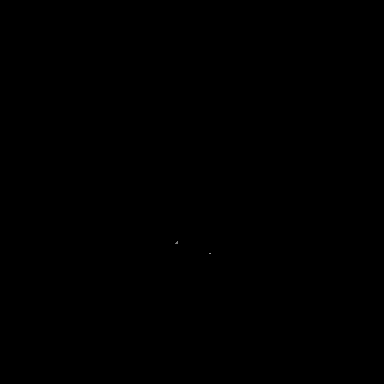

[Series 7: cor dwi_tracew · coronal · 5.0mm · 0.60mm/px · 2 of 40 slices shown]
[im 1/40]
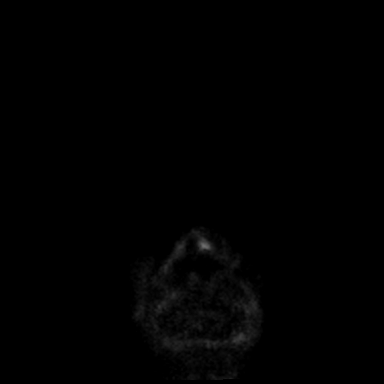
[im 40/40]
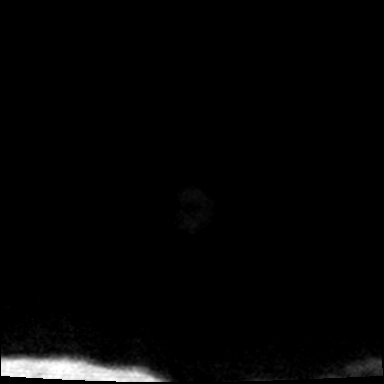

[Series 8: cor dwi_adc · coronal · 5.0mm · 0.60mm/px · 2 of 37 slices shown]
[im 1/37]
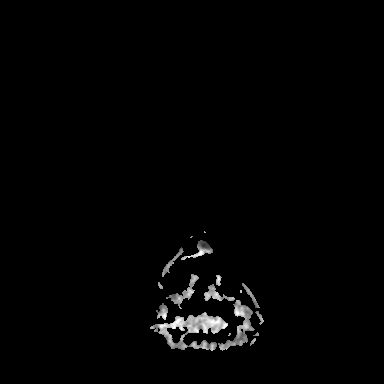
[im 37/37]
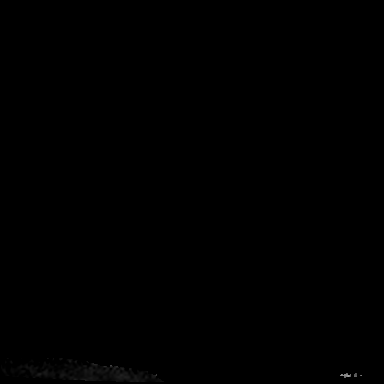

[Series 9: T1 · sagittal · 5.0mm · 0.62mm/px · 2 of 25 slices shown (1 of 2)]
[im 1/25]
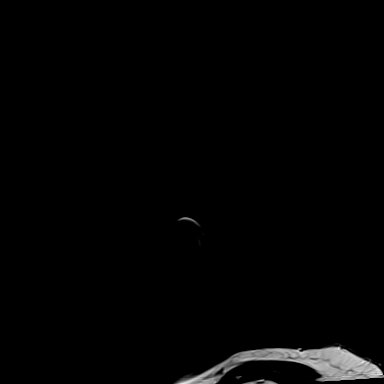
[im 25/25]
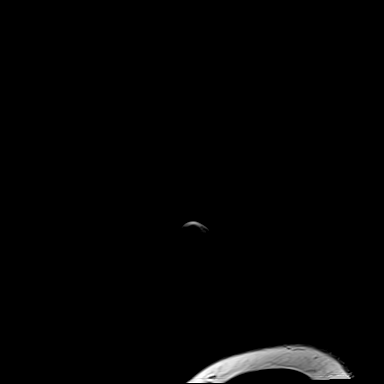

[Series 10: T2 · axial · 5.0mm · 0.53mm/px · z∈[-86,+57]mm · 2 of 25 slices shown (1 of 3)]
[im 1/25]
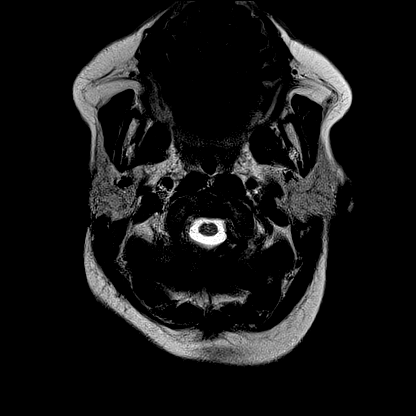
[im 25/25]
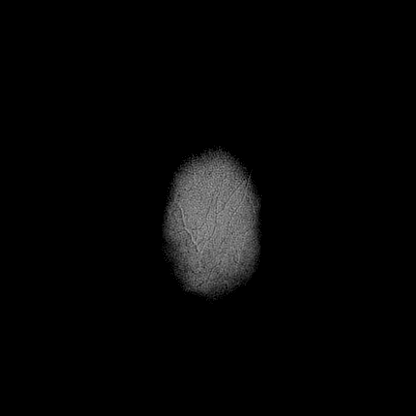

[Series 12: pha_images · axial · 3.0mm · 0.90mm/px · z∈[-103,+70]mm · 4 of 59 slices shown]
[im 1/59]
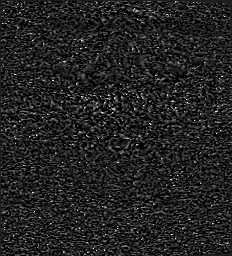
[im 20/59]
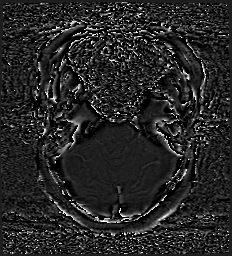
[im 39/59]
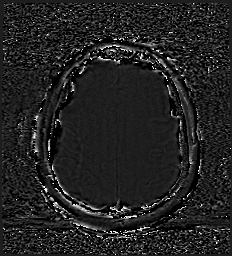
[im 59/59]
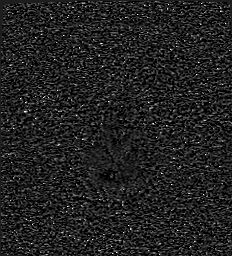

[Series 13: swi_images · axial · 3.0mm · 0.90mm/px · z∈[-103,+73]mm · 4 of 60 slices shown]
[im 1/60]
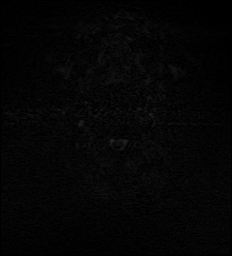
[im 20/60]
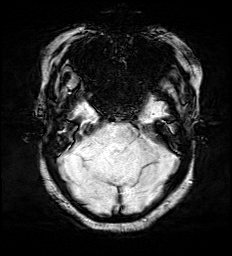
[im 40/60]
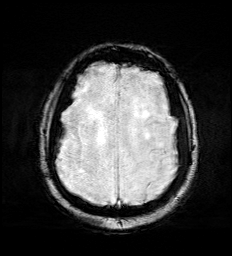
[im 60/60]
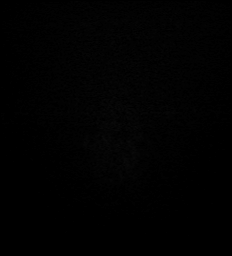

[Series 15: FLAIR · axial · 3.0mm · 0.53mm/px · z∈[-95,+66]mm · 3 of 55 slices shown (1 of 2)]
[im 1/55]
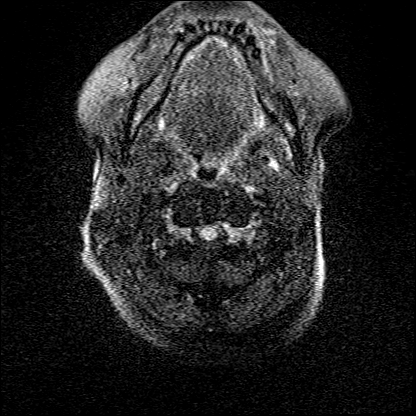
[im 28/55]
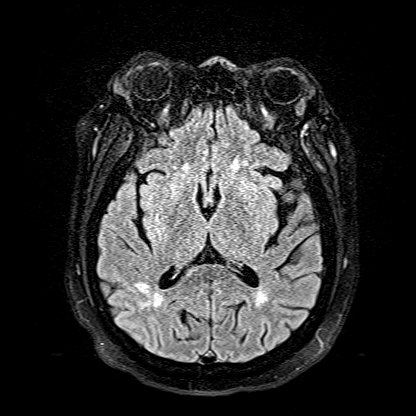
[im 55/55]
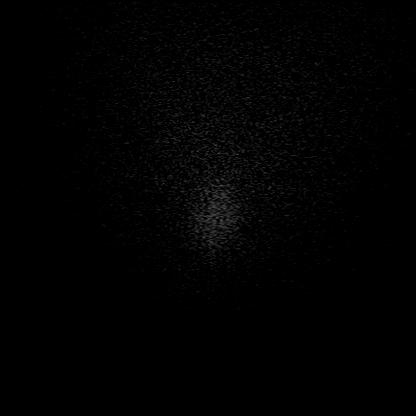

[Series 16: T1 · axial · 1.0mm · 0.98mm/px · z∈[-103,+71]mm · 8 of 176 slices shown (2 of 2)]
[im 1/176]
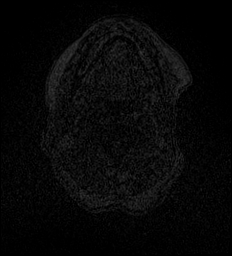
[im 36/176]
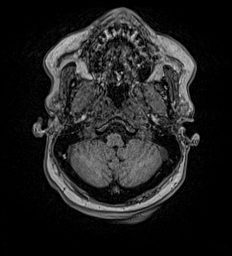
[im 53/176]
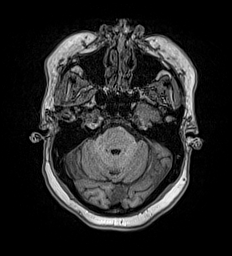
[im 71/176]
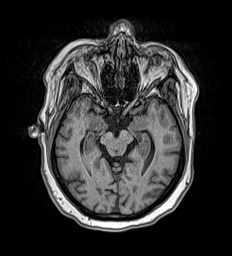
[im 106/176]
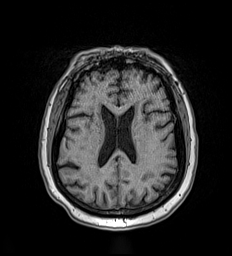
[im 123/176]
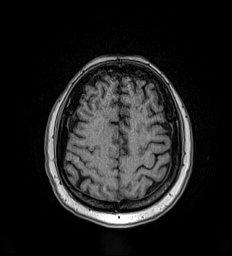
[im 141/176]
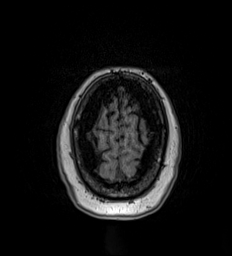
[im 176/176]
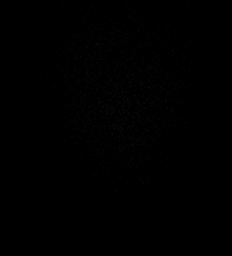

[Series 17: T2 · coronal · 3.0mm · 0.47mm/px · 2 of 35 slices shown (2 of 3)]
[im 1/35]
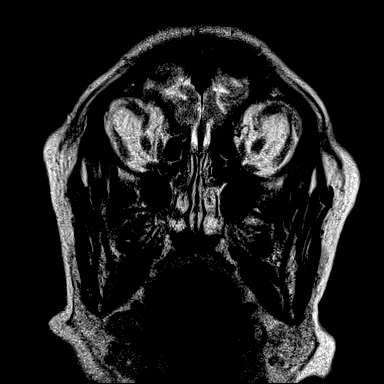
[im 35/35]
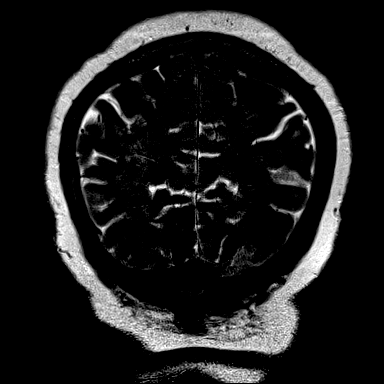

[Series 18: FLAIR · coronal · 3.0mm · 0.47mm/px · 2 of 35 slices shown (2 of 2)]
[im 1/35]
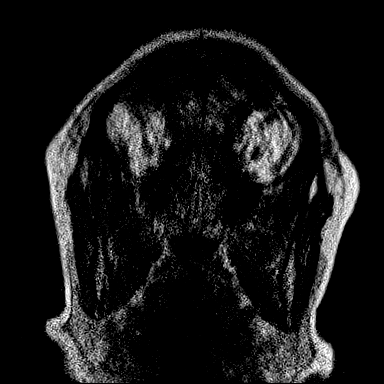
[im 35/35]
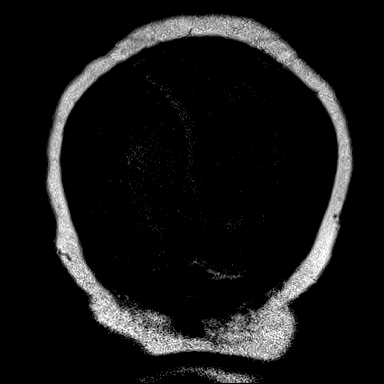

[Series 19: T2 · coronal · 5.0mm · 0.57mm/px · 2 of 29 slices shown (3 of 3)]
[im 1/29]
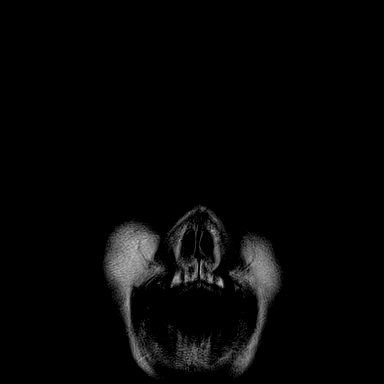
[im 29/29]
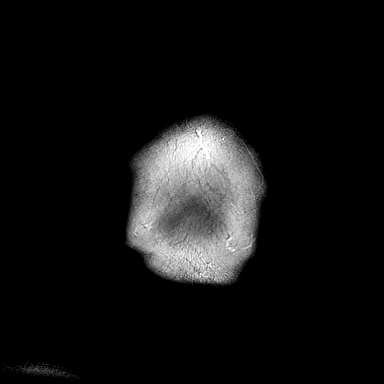

[Series 1025: cor thins rl · coronal · 3.0mm · 0.49mm/px · 1 of 107 slices shown]
[im 1/107]
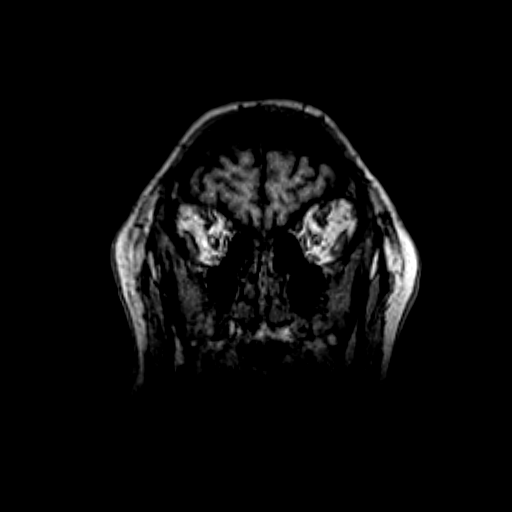

[39 of 48 positions shown; findings below may reference images not displayed]

FINDINGS: Brain: Examination degraded by motion artifact.

Cerebral volume within normal limits for age. Patchy T2/FLAIR
hyperintensity within the periventricular and deep white matter both
cerebral hemispheres most consistent with chronic small vessel
ischemic disease, moderate in nature. Associated few scattered
chronic lacunar infarcts noted at the periventricular white matter
of the right frontal corona radiata. Patchy involvement of the pons
noted.

No abnormal foci of restricted diffusion to suggest acute or
subacute ischemia. Gray-white matter differentiation maintained. No
encephalomalacia to suggest chronic cortical infarction. Single
punctate chronic microhemorrhage noted at the level of the left
lentiform nucleus, likely small vessel related. No other evidence
for acute or chronic intracranial hemorrhage.

No mass lesion, midline shift or mass effect. No hydrocephalus or
extra-axial fluid collection. No appreciable intrinsic temporal lobe
abnormality. Pituitary gland suprasellar region normal. Midline
structures intact.

Vascular: Major intracranial vascular flow voids are maintained.

Skull and upper cervical spine: Craniocervical junction within
normal limits. Degenerative spondylosis noted at C3-4 with suspected
moderate spinal stenosis, incompletely assessed on this exam (series
9, image 13). Bone marrow signal intensity within normal limits. No
scalp soft tissue abnormality.

Sinuses/Orbits: Globes and orbital soft tissues demonstrate no acute
finding. Patient status post ocular lens replacement on the right.
Mild scattered mucosal thickening noted within the ethmoidal air
cells. Paranasal sinuses are otherwise clear. No mastoid effusion.
Inner ear structures grossly normal.

Other: None.
IMPRESSION: 1. No acute intracranial abnormality.
2. Moderate chronic microvascular ischemic disease.
3. Degenerative spondylosis at C3-4 with suspected moderate spinal
stenosis, incompletely assessed on this exam. Finding could be
further assessed with dedicated MRI of the cervical spine as
clinically warranted.

## 2020-10-21 IMAGING — CT CT HEAD W/O CM
3 series · 16 of 47 positions shown, 19 images · non-contrast
Comparison: [DATE]

CLINICAL DATA: Fall 2 days ago, initial encounter

EXAM:
CT HEAD WITHOUT CONTRAST
TECHNIQUE: Contiguous axial images were obtained from the base of the skull
through the vertex without intravenous contrast.

[Series 2: head wo · axial · 0.41mm/px · z∈[-133,-8]mm · 10 of 31 slices shown, 13 images]
[im 3/31  brain]
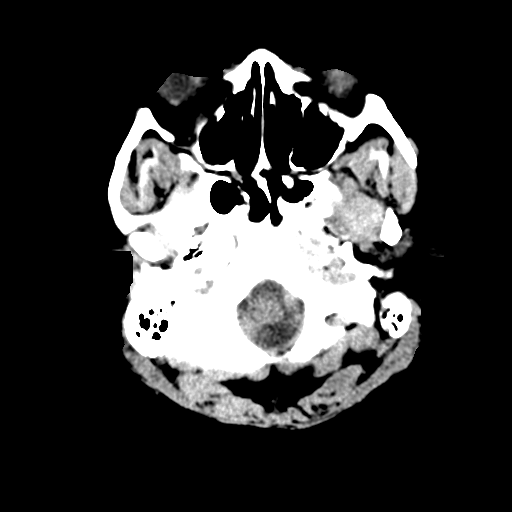
[im 3/31  bone]
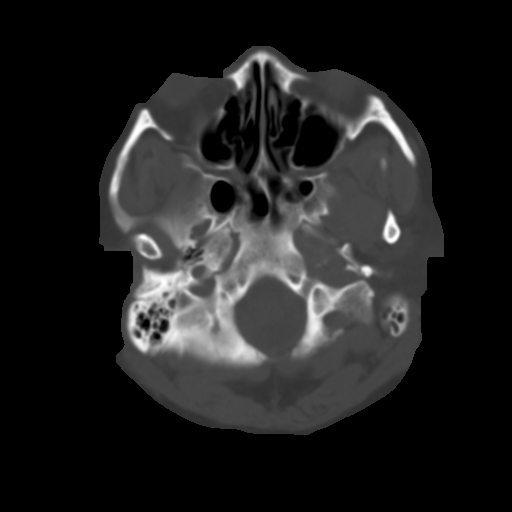
[im 6/31  brain]
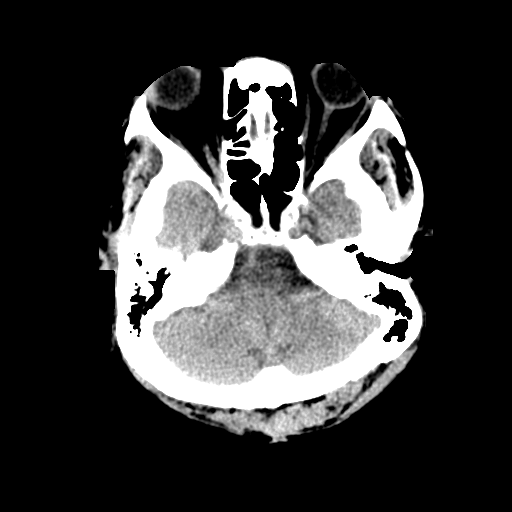
[im 9/31  brain]
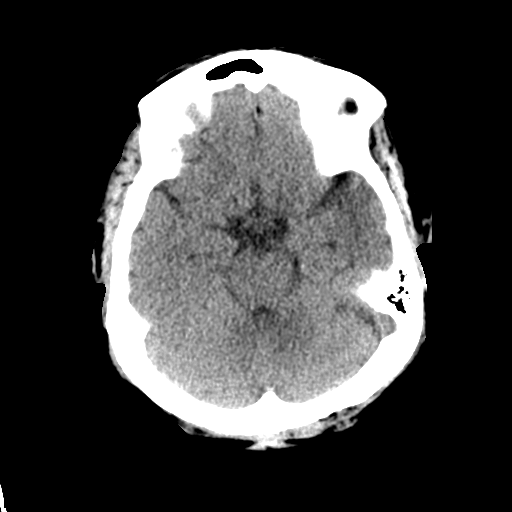
[im 11/31  brain]
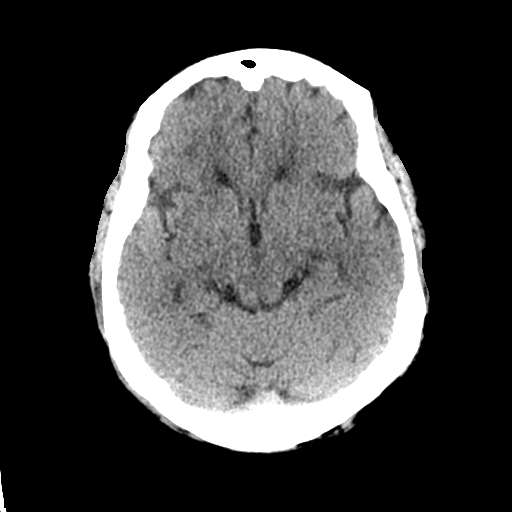
[im 14/31  brain]
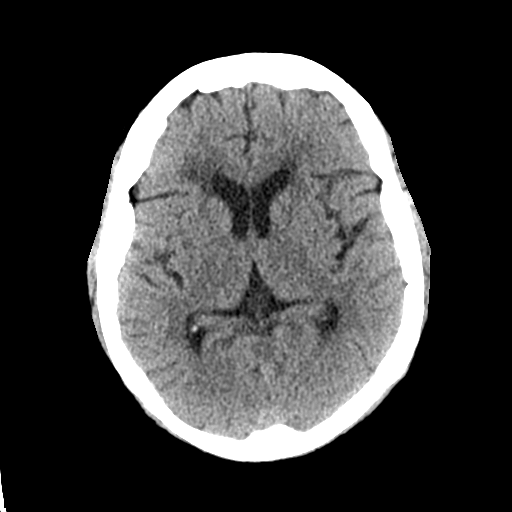
[im 14/31  bone]
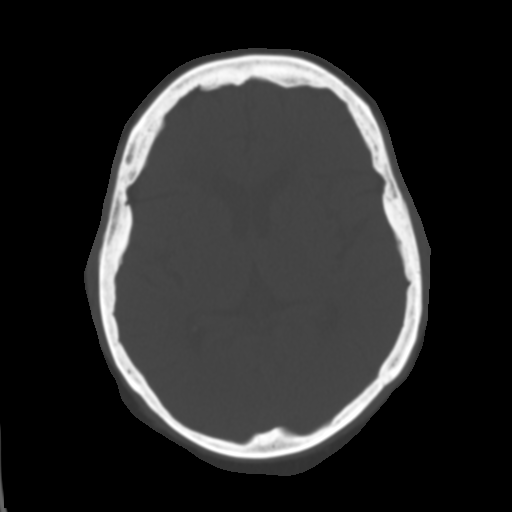
[im 17/31  brain]
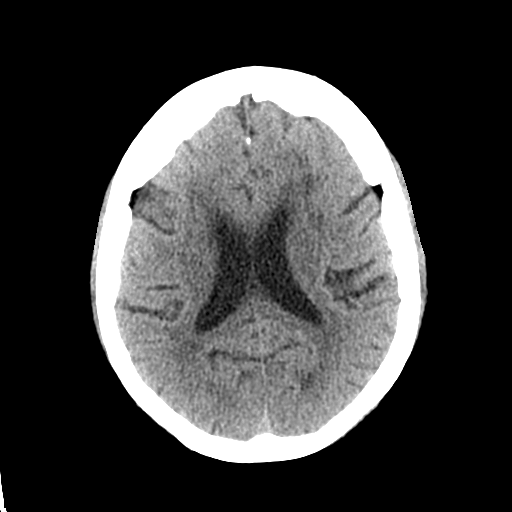
[im 20/31  brain]
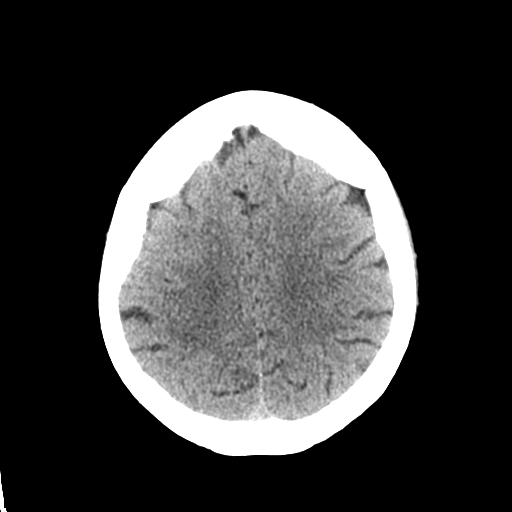
[im 23/31  brain]
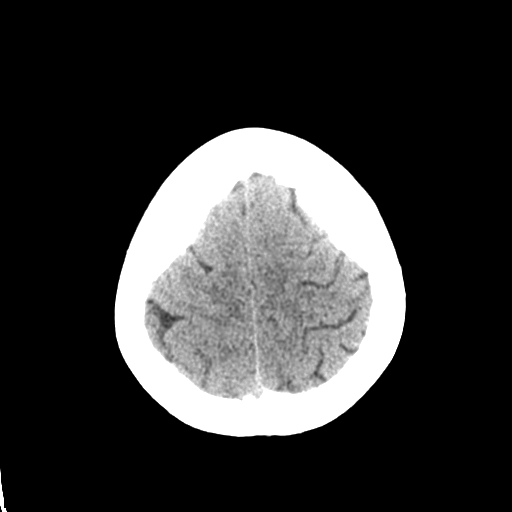
[im 25/31  brain]
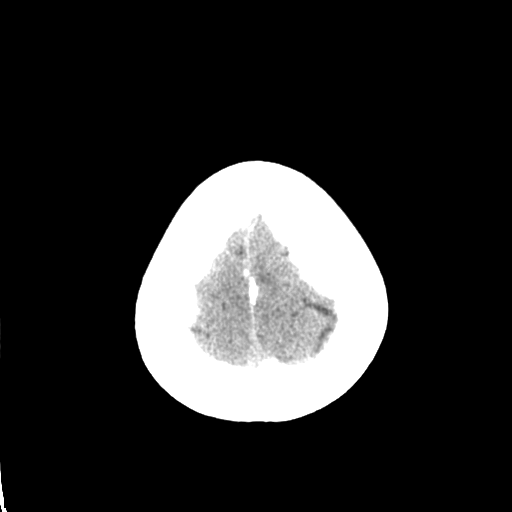
[im 25/31  bone]
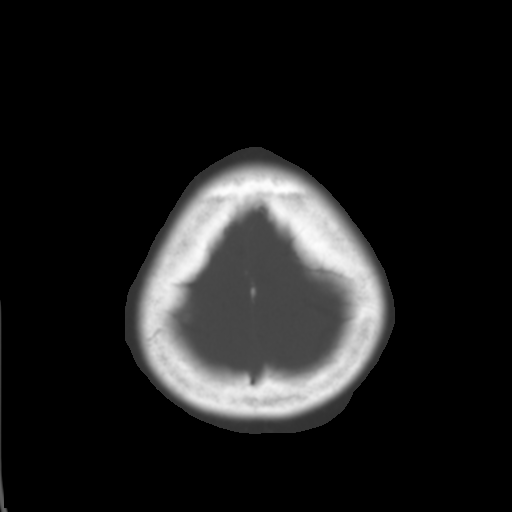
[im 28/31  brain]
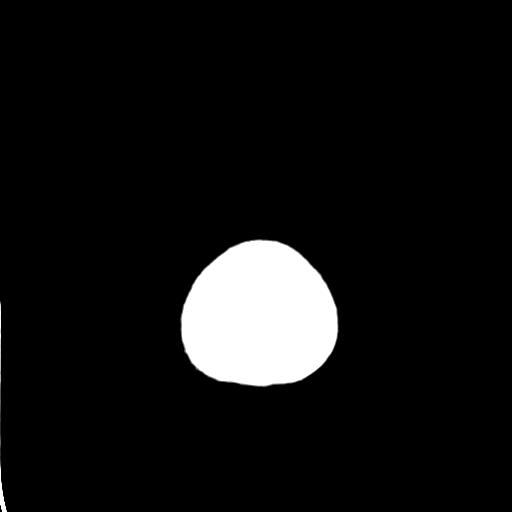

[Series 4: coronal soft tissue · coronal · 0.30mm/px · 3 of 57 slices shown]
[im 19/57  brain]
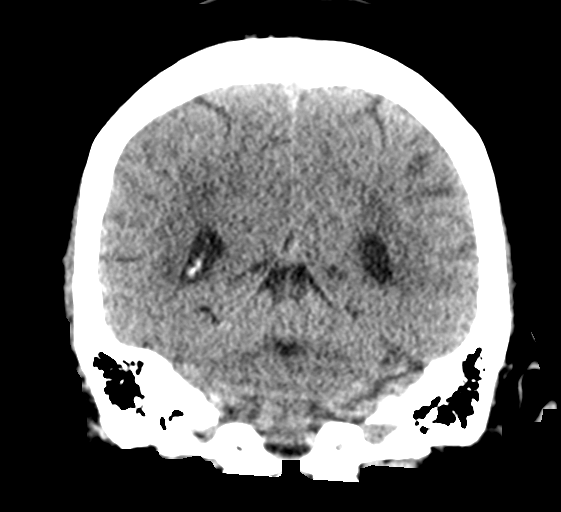
[im 25/57  brain]
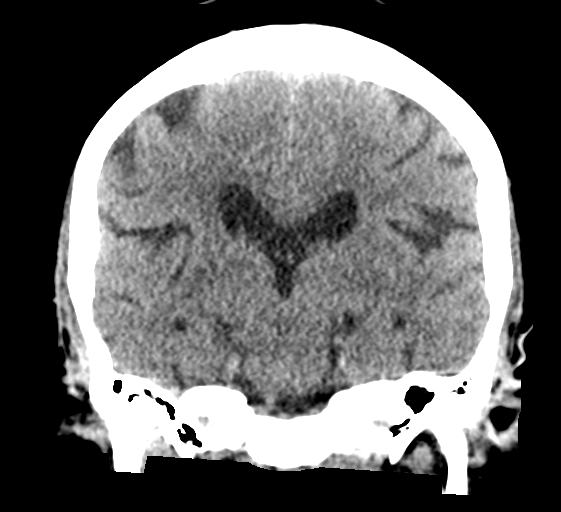
[im 32/57  brain]
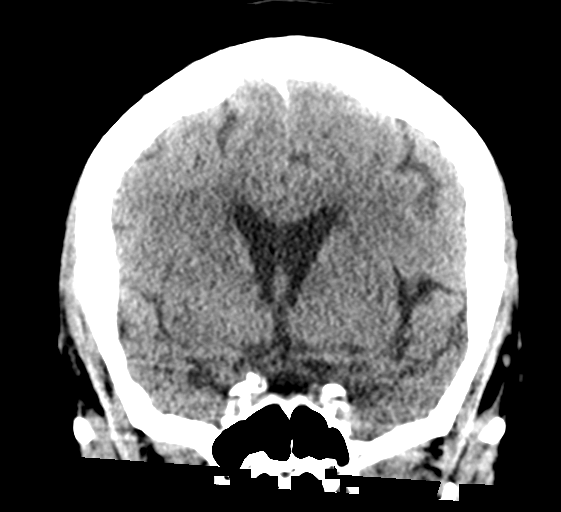

[Series 5: sagittal soft tissue · sagittal · 0.31mm/px · 3 of 53 slices shown]
[im 18/53  brain]
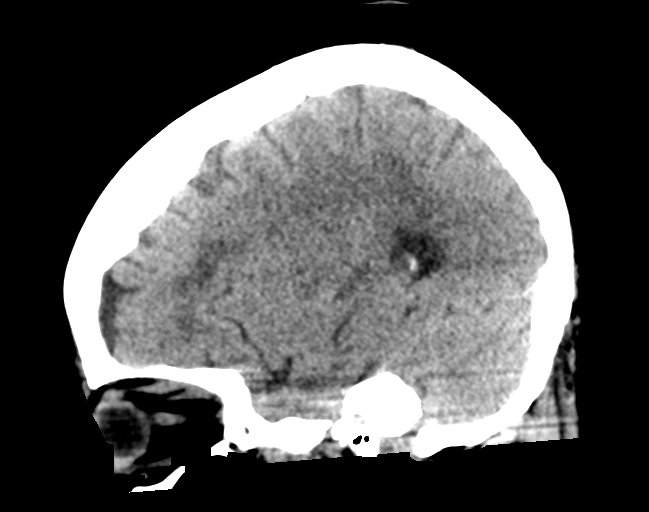
[im 27/53  brain]
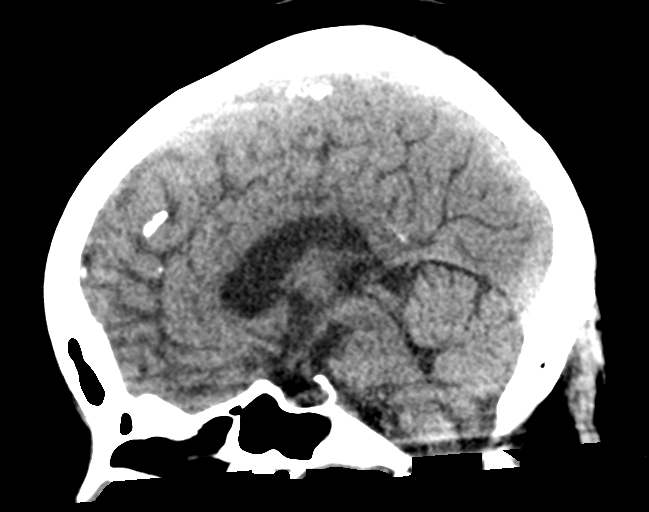
[im 35/53  brain]
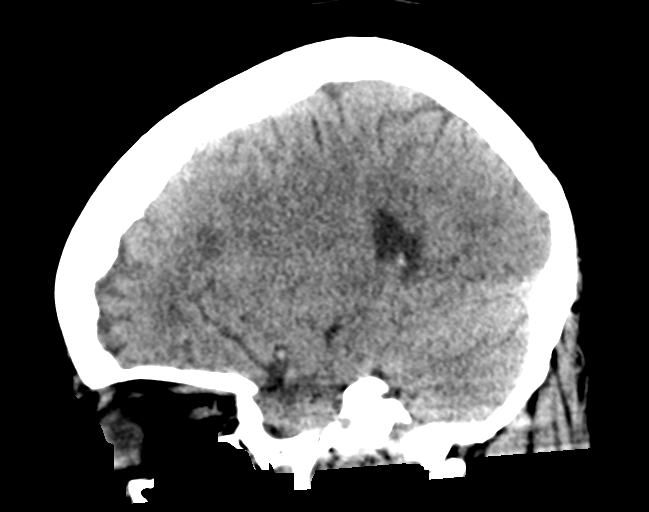

[16 of 47 positions shown; findings below may reference images not displayed]

FINDINGS: Brain: No evidence of acute infarction, hemorrhage, hydrocephalus,
extra-axial collection or mass lesion/mass effect. Mild chronic
white matter ischemic changes are seen.

Vascular: No hyperdense vessel or unexpected calcification.

Skull: Normal. Negative for fracture or focal lesion.

Sinuses/Orbits: No acute finding.

Other: None.
IMPRESSION: No acute intracranial abnormality noted.

## 2020-10-21 MED ORDER — ALPRAZOLAM 0.5 MG PO TABS
0.5000 mg | ORAL_TABLET | Freq: Three times a day (TID) | ORAL | Status: DC | PRN
  Administered 2020-10-21 – 2020-10-24 (×6): 0.5 mg via ORAL
  Filled 2020-10-21 (×6): qty 1

## 2020-10-21 MED ORDER — ASPIRIN EC 325 MG PO TBEC
325.0000 mg | DELAYED_RELEASE_TABLET | Freq: Every day | ORAL | Status: DC
  Administered 2020-10-21 – 2020-10-24 (×4): 325 mg via ORAL
  Filled 2020-10-21 (×4): qty 1

## 2020-10-21 MED ORDER — FLUOXETINE HCL 20 MG PO CAPS
60.0000 mg | ORAL_CAPSULE | Freq: Every day | ORAL | Status: DC
  Administered 2020-10-21 – 2020-10-24 (×4): 60 mg via ORAL
  Filled 2020-10-21 (×4): qty 3

## 2020-10-21 MED ORDER — SODIUM CHLORIDE 0.9 % IV SOLN: Freq: Once | INTRAVENOUS | Status: AC

## 2020-10-21 MED ORDER — ATORVASTATIN CALCIUM 20 MG PO TABS
40.0000 mg | ORAL_TABLET | Freq: Every day | ORAL | Status: DC
  Administered 2020-10-21 – 2020-10-22 (×2): 40 mg via ORAL
  Filled 2020-10-21 (×2): qty 2

## 2020-10-21 MED ORDER — ACETAMINOPHEN 500 MG PO TABS
1000.0000 mg | ORAL_TABLET | Freq: Once | ORAL | Status: AC
  Administered 2020-10-21: 1000 mg via ORAL
  Filled 2020-10-21: qty 2

## 2020-10-21 MED ORDER — LIDOCAINE 5 % EX PTCH
1.0000 | MEDICATED_PATCH | CUTANEOUS | Status: DC
  Administered 2020-10-21 – 2020-10-23 (×2): 1 via TRANSDERMAL
  Filled 2020-10-21 (×5): qty 1

## 2020-10-21 MED ORDER — TRIAMTERENE-HCTZ 37.5-25 MG PO TABS
1.0000 | ORAL_TABLET | Freq: Every day | ORAL | Status: DC
  Administered 2020-10-22 – 2020-10-24 (×3): 1 via ORAL
  Filled 2020-10-21 (×3): qty 1

## 2020-10-21 MED ORDER — GABAPENTIN 100 MG PO CAPS
200.0000 mg | ORAL_CAPSULE | Freq: Every day | ORAL | Status: DC
  Administered 2020-10-21 – 2020-10-24 (×4): 200 mg via ORAL
  Filled 2020-10-21 (×4): qty 2

## 2020-10-21 MED ORDER — SODIUM CHLORIDE 0.9 % IV BOLUS
1000.0000 mL | Freq: Once | INTRAVENOUS | Status: AC
  Administered 2020-10-21: 1000 mL via INTRAVENOUS

## 2020-10-21 NOTE — TOC Initial Note (Signed)
Transition of Care [facility]) - Initial/Assessment Note    Patient Details  Name: Chelsea [name] MRN: [id number] Date of Birth: [birth year]  Transition of Care [facility]) CM/SW Contact:    Iona Beard, [place] Phone Number: 10/21/2020, 8:27 PM  Clinical Narrative:                 [facility] consulted for possible SNF placement/DME/HH needs. CSW spoke with Calla Kicks (Daughter)  [phone] while she was present with pt to complete assessment. Pt lives in independent living at [facility]. Pt is able to complete ADLs independently though it is "slow going". Pt drives. Pt has had [place] [facility] in the past with [facility] who is in the building of [place]. Pt uses a rollator and has a cane. CSW inquired about pts interest in SNF if recommended by PT, per Ms. Harrington Challenger they will listen to what is recommended. CSW explained the referral process. TOC to follow for PT recommendation and update pts daughter Ms. Ross as necessary. TOC to follow.   Expected Discharge Plan: Skilled Nursing Facility Barriers to Discharge: ED SNF auth   Patient Goals and CMS Choice Patient states their goals for this hospitalization and ongoing recovery are:: Go to SNF or have [place] started CMS Medicare.gov Compare Post Acute Care list provided to:: Patient Represenative (must comment) Choice offered to / list presented to : Adult Children  Expected Discharge Plan and Services Expected Discharge Plan: [place] In-house Referral: Clinical Social Work Discharge Planning Services: CM Consult Post Acute Care Choice: [place] Living arrangements for the past 2 months: [place]                                      Prior Living Arrangements/Services Living arrangements for the past 2 months: [place] Lives with:: Self Patient language and need for interpreter reviewed:: Yes Do you feel safe going back to the place where you live?: Yes      Need for Family  Participation in Patient Care: Yes (Comment) Care giver support system in place?: Yes (comment) Current home services: DME Criminal Activity/Legal Involvement Pertinent to Current Situation/Hospitalization: No - Comment as needed  Activities of Daily Living      Permission Sought/Granted                  Emotional Assessment Appearance:: Appears stated age Attitude/Demeanor/Rapport: Engaged Affect (typically observed): Accepting Orientation: : Oriented to Self,Oriented to Place,Oriented to  Time,Oriented to Situation Alcohol / Substance Use: Not Applicable Psych Involvement: No (comment)  Admission diagnosis:  fall,R side pain ems Patient Active Problem List   Diagnosis Date Noted  . Right arm weakness 07/09/2020  . Right leg weakness 07/09/2020  . Urinary tract infection without hematuria 07/09/2020  . Abnormal urinalysis 07/09/2020  . Memory difficulties 07/09/2020  . Chronic anemia 09/26/2018  . History of gastritis 05/03/2018  . History of Helicobacter pylori infection 05/03/2018  . Hx of adenomatous colonic polyps 05/03/2018  . BMI 40.0-44.9, adult ([place]) 03/15/2018  . Postmenopausal 03/15/2018  . Depression, major, recurrent, in partial remission ([place]) 01/11/2018  . Anxiety state 01/11/2018  . History of scoliosis 01/11/2018  . Pure hypercholesterolemia 01/11/2018  . Arthritis of big toe 10/19/2012  . Sinusitis 08/26/2012  . Physical exam, annual 05/16/2012  . Neck pain 04/11/2012  . Allergic conjunctivitis 06/23/2011  . Oral ulcer 06/23/2011  . GAD (  generalized anxiety disorder) 05/14/2011  . Essential hypertension 02/06/2011  . Hyperlipidemia 02/06/2011  . Spinal stenosis of lumbosacral region 02/06/2011  . S/P breast biopsy 02/06/2011   PCP:  Verl Bangs, FNP Pharmacy:   [id number] [address], [place] [address] Phone: [phone] Fax: [phone]     Social Determinants of Health (SDOH)  Interventions    Readmission Risk Interventions No flowsheet data found.

## 2020-10-21 NOTE — ED Notes (Signed)
Patient back from MRI at this time. Family asking to speak to MD. MD aware.

## 2020-10-21 NOTE — ED Provider Notes (Incomplete)
-----------------------------------------   11:24 PM on 10/21/2020 -----------------------------------------  MRI brain interpreted per Dr. Jeannine Boga:  1. No acute intracranial abnormality.  2. Moderate chronic microvascular ischemic disease.  3. Degenerative spondylosis at C3-4 with suspected moderate spinal  stenosis, incompletely assessed on this exam. Finding could be  further assessed with dedicated MRI of the cervical spine as  clinically warranted.   Updated patient and family member of MRI results.  Anticipate patient will board in the ED awaiting CWS consult.  Will recheck CK, troponin.

## 2020-10-21 NOTE — ED Provider Notes (Signed)
-----------------------------------------   11:24 PM on 10/21/2020 -----------------------------------------  MRI brain interpreted per Dr. Jeannine Boga:  1. No acute intracranial abnormality.  2. Moderate chronic microvascular ischemic disease.  3. Degenerative spondylosis at C3-4 with suspected moderate spinal  stenosis, incompletely assessed on this exam. Finding could be  further assessed with dedicated MRI of the cervical spine as  clinically warranted.   Updated patient and family member of MRI results.  Anticipate patient will board in the ED awaiting CSW consult.  Will recheck CK, troponin.   ----------------------------------------- 1:50 AM on 10/22/2020 -----------------------------------------  CK trending down, troponin is stable.  However, patient is COVID+ which most likely explains her increased generalized weakness.  She has been coughing in room air saturations noted to be 93%.  Will obtain chest x-ray, initiate antiviral treatment and discuss with hospitalist services for admission.   Paulette Blanch, MD 10/22/20 [phone]

## 2020-10-21 NOTE — ED Triage Notes (Signed)
Pt to ED via POV with c/o fall on Saturday night. Pt states fell Saturday night at approx 1030 and has been on the floor since this morning. Pt denies injury from the fall, only c/o is skin tear to L elbow.

## 2020-10-21 NOTE — ED Triage Notes (Signed)
Pt in via EMS from [facility] with c/o fall. Pt fell on Saturday and was found this am. Pt with skin tear on elbow and pain to arm but states that the pain in her arm is normal

## 2020-10-21 NOTE — ED Notes (Signed)
Patient on bedpan. Attempting to get urine sample.

## 2020-10-21 NOTE — ED Provider Notes (Signed)
[facility] Emergency Department Provider Note   ____________________________________________   I have reviewed the triage vital signs and the nursing notes.   HISTORY  Chief Complaint Fall and Weakness   History limited by: Not Limited   HPI Chelsea [name] is a 75 y.o. female who presents to the emergency department today because of concern for a fall. The patient states that she fell two days ago coming back from the bathroom. She had been on the ground since then until a maid found her this morning. The patient thinks that she fell because she became weak in her right leg and right arm. She did try to get up off the floor but was unable to. She says that she baseline has issues with the right side of her body because of spinal stenosis and sees a neurologist for this. She denies feeling ill prior to the fall. She says that at this time she only has some increased discomfort in her right arm, which she seems to indicate would be expected since she had been overworking it trying to get up. She denies any significant injury after the fall, does have some abrasions where she was scooting on the carpet.    Records reviewed. Per medical record review patient has a history of spinal stenosis, right arm and leg weakness. UTI.   Past Medical History:  Diagnosis Date  . Anxiety   . Arthritis   . Depression   . Hyperlipidemia   . Hypertension   . Seizures ([place])   . Spinal stenosis   . Ulcer   . Urinary incontinence     Patient Active Problem List   Diagnosis Date Noted  . Right arm weakness 07/09/2020  . Right leg weakness 07/09/2020  . Urinary tract infection without hematuria 07/09/2020  . Abnormal urinalysis 07/09/2020  . Memory difficulties 07/09/2020  . Chronic anemia 09/26/2018  . History of gastritis 05/03/2018  . History of Helicobacter pylori infection 05/03/2018  . Hx of adenomatous colonic polyps 05/03/2018  . BMI 40.0-44.9, adult ([place])  03/15/2018  . Postmenopausal 03/15/2018  . Depression, major, recurrent, in partial remission ([place]) 01/11/2018  . Anxiety state 01/11/2018  . History of scoliosis 01/11/2018  . Pure hypercholesterolemia 01/11/2018  . Arthritis of big toe 10/19/2012  . Sinusitis 08/26/2012  . Physical exam, annual 05/16/2012  . Neck pain 04/11/2012  . Allergic conjunctivitis 06/23/2011  . Oral ulcer 06/23/2011  . GAD (generalized anxiety disorder) 05/14/2011  . Essential hypertension 02/06/2011  . Hyperlipidemia 02/06/2011  . Spinal stenosis of lumbosacral region 02/06/2011  . S/P breast biopsy 02/06/2011    Past Surgical History:  Procedure Laterality Date  . ABDOMINAL HYSTERECTOMY    . BACK SURGERY     due to polio  . BREAST BIOPSY Bilateral    neg  . BREAST BIOPSY Right 2011   neg/stereo  . CARPAL TUNNEL RELEASE    . CATARACT EXTRACTION Right 2020    Prior to Admission medications   Medication Sig Start Date End Date Taking? Authorizing Provider  ALPRAZolam Duanne Moron) 0.5 MG tablet Take 1 tablet (0.5 mg total) by mouth 3 (three) times daily as needed for anxiety. 07/01/20   Malfi, Lupita Raider, FNP  aspirin 325 MG tablet Take 325 mg by mouth daily.      [provider]  atorvastatin (LIPITOR) 40 MG tablet Take 1 tablet (40 mg total) by mouth daily. 02/28/20   Malfi, Lupita Raider, FNP  Cyanocobalamin (VITAMIN B 12 PO) Take 500  mcg by mouth daily.    [provider]  FLUoxetine (PROZAC) 20 MG capsule TAKE 3 CAPSULES BY MOUTH EVERY DAY 10/01/20   Malfi, Lupita Raider, FNP  triamterene-hydrochlorothiazide (MAXZIDE-25) 37.5-25 MG tablet TAKE 1 TABLET BY MOUTH EVERY DAY 10/07/20   Verl Bangs, FNP  simvastatin (ZOCOR) 40 MG tablet TAKE 1 TABLET BY MOUTH EVERY DAY 10/30/19 02/28/20  Olin Hauser, DO    Allergies Penicillins  Family History  Problem Relation Age of Onset  . Arthritis Mother   . Heart disease Mother   . Stroke Mother   . Hypertension Mother   . COPD Mother    . Sudden death Sister   . Other Father        unknown medical history    Social History Social History   Tobacco Use  . Smoking status: Never Smoker  . Smokeless tobacco: Never Used  Vaping Use  . Vaping Use: Never used  Substance Use Topics  . Alcohol use: Never  . Drug use: No    Review of Systems Constitutional: No fever/chills Eyes: No visual changes. ENT: No sore throat. Cardiovascular: Denies chest pain. Respiratory: Denies shortness of breath. Gastrointestinal: No abdominal pain.  No nausea, no vomiting.  No diarrhea.   Genitourinary: Negative for dysuria. Musculoskeletal: Right arm pain. Skin: Positive for abrasion to knees.  Neurological: Positive for right arm and leg weakness.  ____________________________________________   PHYSICAL EXAM:  VITAL SIGNS: ED Triage Vitals  Enc Vitals Group     BP 10/21/20 1214 115/68     Pulse Rate 10/21/20 1214 75     Resp 10/21/20 1214 20     Temp 10/21/20 1214 98.3 F (36.8 C)     Temp Source 10/21/20 1214 Oral     SpO2 10/21/20 1214 93 %     Weight 10/21/20 1212 184 lb 15.5 oz (83.9 kg)     Height 10/21/20 1212 4\' 9"  (1.448 m)     Head Circumference --      Peak Flow --      Pain Score 10/21/20 1211 7   Constitutional: Alert and oriented.  Eyes: Conjunctivae are normal.  ENT      Head: Normocephalic and atraumatic.      Nose: No congestion/rhinnorhea.      Mouth/Throat: Mucous membranes are moist.      Neck: No stridor. Hematological/Lymphatic/Immunilogical: No cervical lymphadenopathy. Cardiovascular: Normal rate, regular rhythm.  No murmurs, rubs, or gallops.  Respiratory: Normal respiratory effort without tachypnea nor retractions. Breath sounds are clear and equal bilaterally. No wheezes/rales/rhonchi. Gastrointestinal: Soft and non tender. No rebound. No guarding.  Genitourinary: Deferred Musculoskeletal: Normal range of motion in all extremities. No lower extremity edema. Neurologic:  Normal speech  and language. Right extremity weakness. Skin:  Skin is warm, dry and intact. No rash noted. Psychiatric: Mood and affect are normal. Speech and behavior are normal. Patient exhibits appropriate insight and judgment.  ____________________________________________    LABS (pertinent positives/negatives)  CK 1600 CBC wbc 6.0, hgb 11.9, plt 233 CMP wnl except glu 102, ast 60  ____________________________________________   EKG  I, Nance Pear, attending physician, personally viewed and interpreted this EKG  EKG Time: 1214 Rate: 84 Rhythm: normal sinus rhythm Axis: normal Intervals: qtc 432 QRS: narrow ST changes: no st elevation Impression: normal ekg  ____________________________________________    RADIOLOGY  CT head No acute abnormality  ____________________________________________   PROCEDURES  Procedures  ____________________________________________   INITIAL IMPRESSION / ASSESSMENT AND PLAN / ED  COURSE  Pertinent labs & imaging results that were available during my care of the patient were reviewed by me and considered in my medical decision making (see chart for details).   Presented to the emergency department today after suffering a fall and being down on the ground for 2 days.  Patient denies any significant traumatic injury from the fall.  Blood work-up here shows slight elevation of his CK although no evidence of acute kidney injury.  It is somewhat unclear what patient's baseline ambulatory status is.  She was able to get up and was able to walk with a walker although did appear somewhat difficult for her.  Did have discussion with patient and family.  This time will plan on having patient stay overnight to be evaluated by physical therapy and case manager in the morning.  Will obtain MRI in case there was an acute stroke however I have low suspicion for this given somewhat chronic nature of patient's  weakness.  ____________________________________________   FINAL CLINICAL IMPRESSION(S) / ED DIAGNOSES  Final diagnoses:  Fall, initial encounter  Weakness     Note: This dictation was prepared with Dragon dictation. Any transcriptional errors that result from this process are unintentional     Nance Pear, MD 10/21/20 2041

## 2020-10-21 NOTE — ED Notes (Signed)
Patient to CT scan

## 2020-10-21 NOTE — ED Notes (Signed)
Patient ambulated through hallway with walker.

## 2020-10-21 NOTE — ED Notes (Signed)
Patient to MRI at this time.

## 2020-10-21 NOTE — ED Notes (Signed)
Patient given dinner tray.

## 2020-10-21 NOTE — Discharge Instructions (Signed)
Please seek medical attention for any high fevers, chest pain, shortness of breath, change in behavior, persistent vomiting, bloody stool or any other new or concerning symptoms.  

## 2020-10-22 ENCOUNTER — Encounter: Payer: Self-pay | Admitting: Family Medicine

## 2020-10-22 ENCOUNTER — Emergency Department: Payer: Medicare Other

## 2020-10-22 ENCOUNTER — Other Ambulatory Visit: Payer: Self-pay

## 2020-10-22 DIAGNOSIS — T796XXD Traumatic ischemia of muscle, subsequent encounter: Secondary | ICD-10-CM

## 2020-10-22 DIAGNOSIS — M6282 Rhabdomyolysis: Secondary | ICD-10-CM | POA: Diagnosis not present

## 2020-10-22 DIAGNOSIS — M1990 Unspecified osteoarthritis, unspecified site: Secondary | ICD-10-CM | POA: Diagnosis present

## 2020-10-22 DIAGNOSIS — Y92009 Unspecified place in unspecified non-institutional (private) residence as the place of occurrence of the external cause: Secondary | ICD-10-CM | POA: Diagnosis not present

## 2020-10-22 DIAGNOSIS — U071 COVID-19: Secondary | ICD-10-CM

## 2020-10-22 DIAGNOSIS — M479 Spondylosis, unspecified: Secondary | ICD-10-CM | POA: Diagnosis present

## 2020-10-22 DIAGNOSIS — E7800 Pure hypercholesterolemia, unspecified: Secondary | ICD-10-CM | POA: Diagnosis present

## 2020-10-22 DIAGNOSIS — R569 Unspecified convulsions: Secondary | ICD-10-CM | POA: Diagnosis present

## 2020-10-22 DIAGNOSIS — Z880 Allergy status to penicillin: Secondary | ICD-10-CM | POA: Diagnosis not present

## 2020-10-22 DIAGNOSIS — J1282 Pneumonia due to coronavirus disease 2019: Secondary | ICD-10-CM

## 2020-10-22 DIAGNOSIS — G894 Chronic pain syndrome: Secondary | ICD-10-CM | POA: Diagnosis not present

## 2020-10-22 DIAGNOSIS — M4802 Spinal stenosis, cervical region: Secondary | ICD-10-CM | POA: Diagnosis present

## 2020-10-22 DIAGNOSIS — Z8261 Family history of arthritis: Secondary | ICD-10-CM | POA: Diagnosis not present

## 2020-10-22 DIAGNOSIS — E538 Deficiency of other specified B group vitamins: Secondary | ICD-10-CM | POA: Diagnosis present

## 2020-10-22 DIAGNOSIS — R531 Weakness: Secondary | ICD-10-CM | POA: Diagnosis not present

## 2020-10-22 DIAGNOSIS — Z90710 Acquired absence of both cervix and uterus: Secondary | ICD-10-CM | POA: Diagnosis not present

## 2020-10-22 DIAGNOSIS — F32A Depression, unspecified: Secondary | ICD-10-CM | POA: Diagnosis present

## 2020-10-22 DIAGNOSIS — I10 Essential (primary) hypertension: Secondary | ICD-10-CM | POA: Diagnosis present

## 2020-10-22 DIAGNOSIS — T796XXA Traumatic ischemia of muscle, initial encounter: Secondary | ICD-10-CM | POA: Diagnosis present

## 2020-10-22 DIAGNOSIS — F419 Anxiety disorder, unspecified: Secondary | ICD-10-CM | POA: Diagnosis present

## 2020-10-22 DIAGNOSIS — E669 Obesity, unspecified: Secondary | ICD-10-CM | POA: Diagnosis present

## 2020-10-22 DIAGNOSIS — E785 Hyperlipidemia, unspecified: Secondary | ICD-10-CM

## 2020-10-22 DIAGNOSIS — Z79899 Other long term (current) drug therapy: Secondary | ICD-10-CM | POA: Diagnosis not present

## 2020-10-22 DIAGNOSIS — W1830XA Fall on same level, unspecified, initial encounter: Secondary | ICD-10-CM | POA: Diagnosis present

## 2020-10-22 DIAGNOSIS — Z8249 Family history of ischemic heart disease and other diseases of the circulatory system: Secondary | ICD-10-CM | POA: Diagnosis not present

## 2020-10-22 DIAGNOSIS — Z7982 Long term (current) use of aspirin: Secondary | ICD-10-CM | POA: Diagnosis not present

## 2020-10-22 DIAGNOSIS — Z86010 Personal history of colonic polyps: Secondary | ICD-10-CM | POA: Diagnosis not present

## 2020-10-22 DIAGNOSIS — Z6838 Body mass index (BMI) 38.0-38.9, adult: Secondary | ICD-10-CM | POA: Diagnosis not present

## 2020-10-22 LAB — URINALYSIS, COMPLETE (UACMP) WITH MICROSCOPIC
Bilirubin Urine: NEGATIVE
Glucose, UA: NEGATIVE mg/dL
Hgb urine dipstick: NEGATIVE
Ketones, ur: NEGATIVE mg/dL
Nitrite: NEGATIVE
Protein, ur: 30 mg/dL — AB
Specific Gravity, Urine: 1.027 (ref 1.005–1.030)
Squamous Epithelial / HPF: >50 — ABNORMAL HIGH (ref 0–5)
pH: 5 (ref 5.0–8.0)

## 2020-10-22 LAB — TROPONIN I (HIGH SENSITIVITY): Troponin I (High Sensitivity): 11 ng/L (ref <18)

## 2020-10-22 LAB — RESP PANEL BY RT-PCR (FLU A&B, COVID) ARPGX2
Influenza A by PCR: NEGATIVE
Influenza B by PCR: NEGATIVE
SARS Coronavirus 2 by RT PCR: POSITIVE — AB

## 2020-10-22 LAB — MRSA PCR SCREENING: MRSA by PCR: NEGATIVE

## 2020-10-22 LAB — BRAIN NATRIURETIC PEPTIDE: B Natriuretic Peptide: 29.1 pg/mL (ref 0.0–100.0)

## 2020-10-22 LAB — C-REACTIVE PROTEIN: CRP: 2.1 mg/dL — ABNORMAL HIGH (ref <1.0)

## 2020-10-22 LAB — CK
Total CK: 1008 U/L — ABNORMAL HIGH (ref 38–234)
Total CK: 985 U/L — ABNORMAL HIGH (ref 38–234)

## 2020-10-22 LAB — LACTATE DEHYDROGENASE: LDH: 252 U/L — ABNORMAL HIGH (ref 98–192)

## 2020-10-22 LAB — FERRITIN: Ferritin: 75 ng/mL (ref 11–307)

## 2020-10-22 LAB — FIBRINOGEN: Fibrinogen: 551 mg/dL — ABNORMAL HIGH (ref 210–475)

## 2020-10-22 LAB — ABO/RH: ABO/RH(D): O POS

## 2020-10-22 LAB — HEPATITIS B SURFACE ANTIGEN: Hepatitis B Surface Ag: NONREACTIVE

## 2020-10-22 LAB — PROCALCITONIN: Procalcitonin: <0.1 ng/mL

## 2020-10-22 LAB — FIBRIN DERIVATIVES D-DIMER (ARMC ONLY): Fibrin derivatives D-dimer (ARMC): 907.54 ng/mL (FEU) — ABNORMAL HIGH (ref 0.00–499.00)

## 2020-10-22 IMAGING — DX DG CHEST 1V PORT
1 series · 1 of 1 positions shown · non-contrast
Comparison: Radiograph [DATE]

CLINICAL DATA: [MC], weakness

EXAM:
PORTABLE CHEST 1 VIEW

[chest ap]
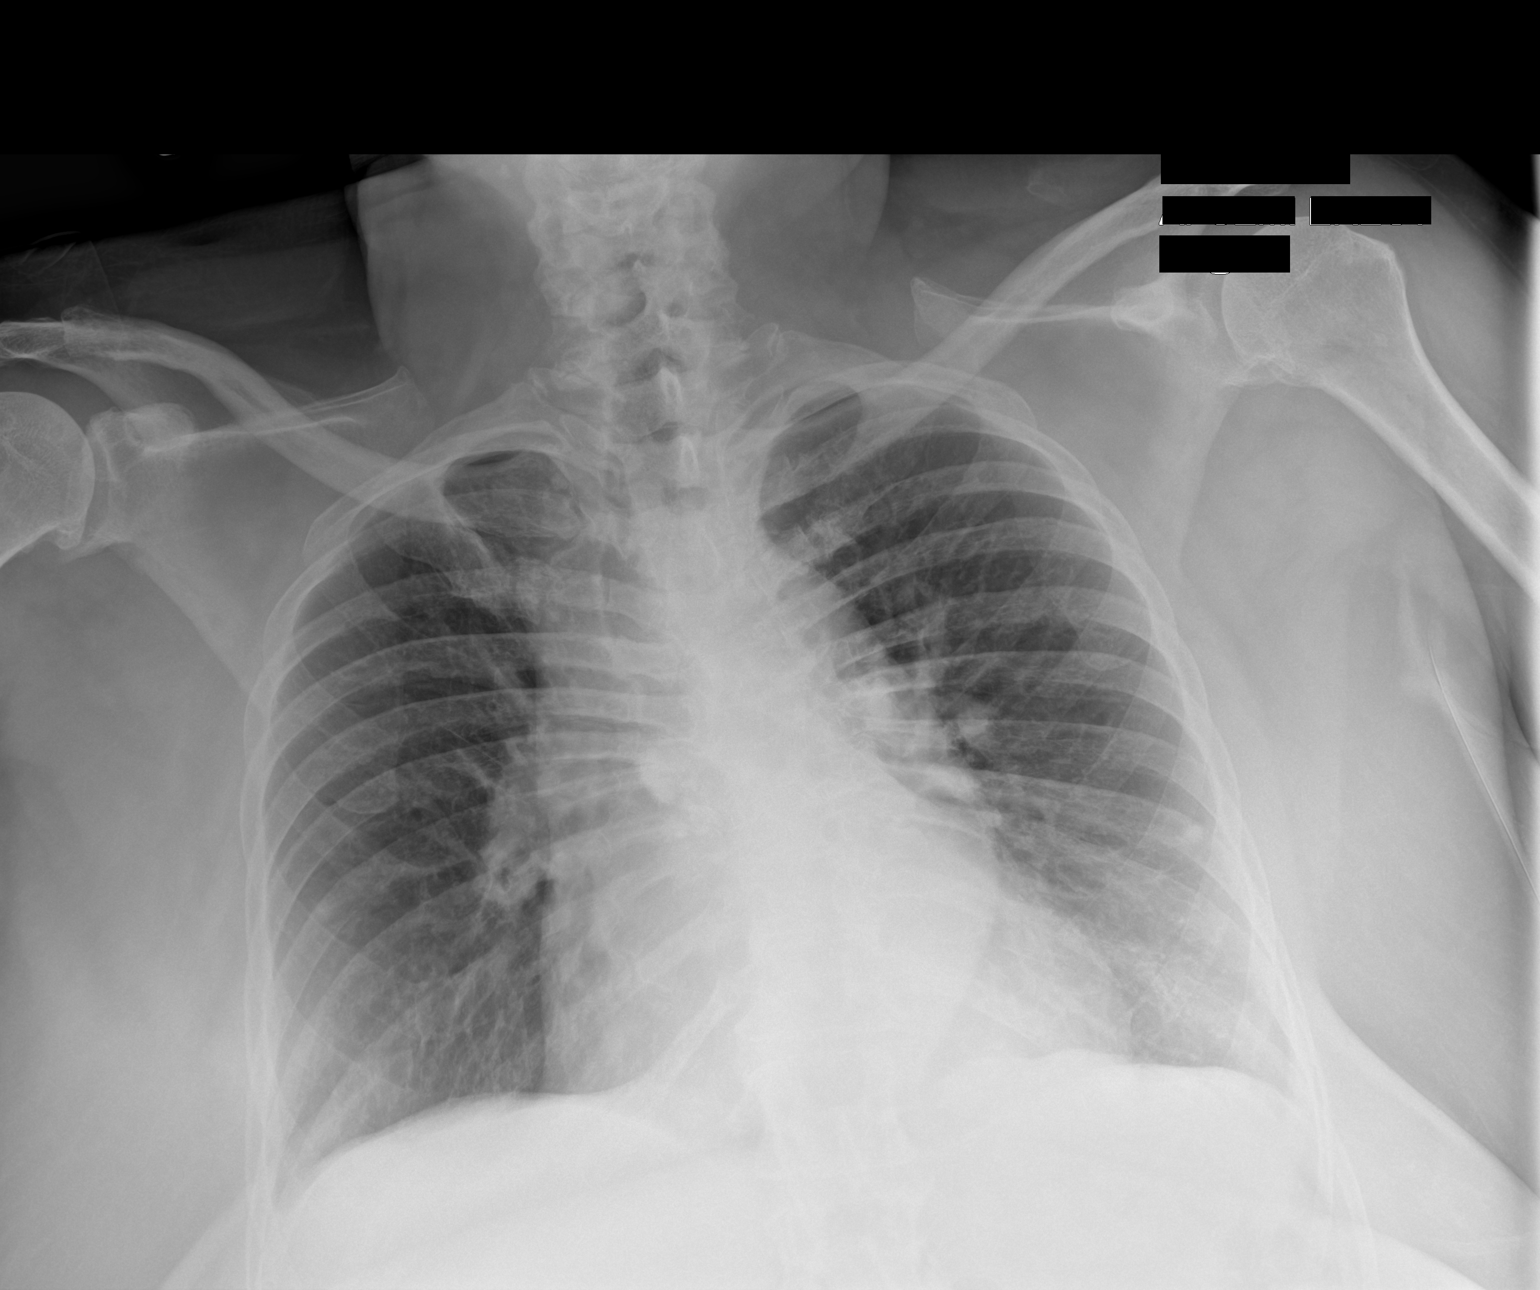

[1 of 1 positions shown; findings below may reference images not displayed]

FINDINGS: There are chronically coarsened interstitial and bronchitic changes
in the lungs. Some ill-defined patchy opacities present in the left
lung base. Two small nodular opacities are present in the lungs as
well in the periphery of the left lower lung in the periphery of the
right mid lung, incompletely characterized on this exam. No
pneumothorax. No visible effusion. No acute osseous or soft tissue
abnormality. Degenerative changes are present in the imaged spine
and shoulders.
IMPRESSION: 1. Ill-defined patchy opacities in the left lung base could reflect
atelectasis or developing infection.
2. Background of chronic interstitial and bronchitic changes.
3. Two small nodular opacities in the lungs as well as the periphery
of the right mid lung, which may have been present on comparison and
may reflect sequela of granulomatous disease though could be better
characterized with cross-sectional imaging.

## 2020-10-22 MED ORDER — BISACODYL 5 MG PO TBEC: 5.0000 mg | DELAYED_RELEASE_TABLET | Freq: Every day | ORAL | Status: DC | PRN

## 2020-10-22 MED ORDER — SODIUM CHLORIDE 0.9 % IV SOLN
100.0000 mg | Freq: Every day | INTRAVENOUS | Status: DC
  Administered 2020-10-23: 100 mg via INTRAVENOUS
  Filled 2020-10-22: qty 20

## 2020-10-22 MED ORDER — PREDNISONE 50 MG PO TABS: 50.0000 mg | ORAL_TABLET | Freq: Every day | ORAL | Status: DC

## 2020-10-22 MED ORDER — MUSCLE RUB 10-15 % EX CREA
TOPICAL_CREAM | CUTANEOUS | Status: DC | PRN
  Filled 2020-10-22: qty 85

## 2020-10-22 MED ORDER — GUAIFENESIN ER 600 MG PO TB12
600.0000 mg | ORAL_TABLET | Freq: Two times a day (BID) | ORAL | Status: DC
  Administered 2020-10-22 – 2020-10-24 (×5): 600 mg via ORAL
  Filled 2020-10-22 (×5): qty 1

## 2020-10-22 MED ORDER — ENOXAPARIN SODIUM 40 MG/0.4ML ~~LOC~~ SOLN
40.0000 mg | SUBCUTANEOUS | Status: DC
  Administered 2020-10-22 – 2020-10-24 (×3): 40 mg via SUBCUTANEOUS
  Filled 2020-10-22 (×3): qty 0.4

## 2020-10-22 MED ORDER — ACETAMINOPHEN 325 MG PO TABS
650.0000 mg | ORAL_TABLET | Freq: Four times a day (QID) | ORAL | Status: DC | PRN
  Administered 2020-10-23 – 2020-10-24 (×4): 650 mg via ORAL
  Filled 2020-10-22 (×4): qty 2

## 2020-10-22 MED ORDER — SODIUM CHLORIDE 0.9 % IV SOLN: INTRAVENOUS | Status: DC

## 2020-10-22 MED ORDER — SENNOSIDES-DOCUSATE SODIUM 8.6-50 MG PO TABS
1.0000 | ORAL_TABLET | Freq: Every day | ORAL | Status: DC
  Administered 2020-10-22 – 2020-10-23 (×2): 1 via ORAL
  Filled 2020-10-22 (×2): qty 1

## 2020-10-22 MED ORDER — METHYLPREDNISOLONE SODIUM SUCC 125 MG IJ SOLR
1.0000 mg/kg | Freq: Two times a day (BID) | INTRAMUSCULAR | Status: DC
Start: 2020-10-22 — End: 2020-10-22

## 2020-10-22 MED ORDER — HYDROCOD POLST-CPM POLST ER 10-8 MG/5ML PO SUER
5.0000 mL | Freq: Two times a day (BID) | ORAL | Status: DC | PRN
  Administered 2020-10-23: 5 mL via ORAL
  Filled 2020-10-22: qty 5

## 2020-10-22 MED ORDER — MAGNESIUM HYDROXIDE 400 MG/5ML PO SUSP
30.0000 mL | Freq: Every day | ORAL | Status: DC | PRN
  Filled 2020-10-22: qty 30

## 2020-10-22 MED ORDER — VITAMIN D 25 MCG (1000 UNIT) PO TABS
1000.0000 [IU] | ORAL_TABLET | Freq: Every day | ORAL | Status: DC
  Administered 2020-10-22 – 2020-10-24 (×3): 1000 [IU] via ORAL
  Filled 2020-10-22 (×3): qty 1

## 2020-10-22 MED ORDER — ZINC SULFATE 220 (50 ZN) MG PO CAPS
220.0000 mg | ORAL_CAPSULE | Freq: Every day | ORAL | Status: DC
  Administered 2020-10-22 – 2020-10-24 (×3): 220 mg via ORAL
  Filled 2020-10-22 (×3): qty 1

## 2020-10-22 MED ORDER — TRAZODONE HCL 50 MG PO TABS: 25.0000 mg | ORAL_TABLET | Freq: Every evening | ORAL | Status: DC | PRN

## 2020-10-22 MED ORDER — ONDANSETRON HCL 4 MG PO TABS: 4.0000 mg | ORAL_TABLET | Freq: Four times a day (QID) | ORAL | Status: DC | PRN

## 2020-10-22 MED ORDER — ASPIRIN EC 81 MG PO TBEC: 81.0000 mg | DELAYED_RELEASE_TABLET | Freq: Every day | ORAL | Status: DC

## 2020-10-22 MED ORDER — ONDANSETRON HCL 4 MG/2ML IJ SOLN: 4.0000 mg | Freq: Four times a day (QID) | INTRAMUSCULAR | Status: DC | PRN

## 2020-10-22 MED ORDER — PREDNISONE 20 MG PO TABS: 50.0000 mg | ORAL_TABLET | Freq: Every day | ORAL | Status: DC

## 2020-10-22 MED ORDER — METHYLPREDNISOLONE SODIUM SUCC 125 MG IJ SOLR
1.0000 mg/kg | Freq: Two times a day (BID) | INTRAMUSCULAR | Status: DC
  Administered 2020-10-22: 83.75 mg via INTRAVENOUS
  Filled 2020-10-22: qty 2

## 2020-10-22 MED ORDER — ASCORBIC ACID 500 MG PO TABS
500.0000 mg | ORAL_TABLET | Freq: Every day | ORAL | Status: DC
  Administered 2020-10-22 – 2020-10-24 (×3): 500 mg via ORAL
  Filled 2020-10-22 (×3): qty 1

## 2020-10-22 MED ORDER — GUAIFENESIN-DM 100-10 MG/5ML PO SYRP: 10.0000 mL | ORAL_SOLUTION | ORAL | Status: DC | PRN

## 2020-10-22 MED ORDER — FAMOTIDINE 20 MG PO TABS
20.0000 mg | ORAL_TABLET | Freq: Two times a day (BID) | ORAL | Status: DC
  Administered 2020-10-22 – 2020-10-24 (×6): 20 mg via ORAL
  Filled 2020-10-22 (×6): qty 1

## 2020-10-22 MED ORDER — SODIUM CHLORIDE 0.9 % IV SOLN
200.0000 mg | Freq: Once | INTRAVENOUS | Status: AC
  Administered 2020-10-22: 200 mg via INTRAVENOUS
  Filled 2020-10-22: qty 200

## 2020-10-22 MED ORDER — POLYETHYLENE GLYCOL 3350 17 G PO PACK
17.0000 g | PACK | Freq: Every day | ORAL | Status: DC
  Administered 2020-10-23 – 2020-10-24 (×2): 17 g via ORAL
  Filled 2020-10-22 (×2): qty 1

## 2020-10-22 NOTE — Progress Notes (Addendum)
  PROGRESS NOTE    Chelsea [name]  BMS:[id number] DOB: Jun 07, 1946 DOA: 10/21/2020  PCP: Verl Bangs, FNP    LOS - 0    Patient admitted earlier this morning with rhabdomyolysis after a mechanical fall at home and down 3 days before found. She also tested positive for COVID-19 with multifocal PNA on imaging.  Interval subjective: pt seen in ED holding for a bed.  Feeling okay.  No headache.  Diarrhea resolved.  No F/C, N/V, CP, SOB or other complaints  Exam: L periorbital ecchymosis and swelling.  Lungs clear.  Heart RRR.  No peripheral edema. Abdomen soft and non-tender   Active Problems:   Rhabdomyolysis    I have reviewed the full H&P by Dr. Sidney Ace in detail, and I agree with the assessment and plan as outlined therein. In addition: --hold Lipitor --stop steroids - not hypoxic --abnormal UA, pt denies symptoms but will check urine cx --bowel regimen for constipation --topical muscle rub to right shoulder PRN ([place] equivalent)   No Charge    Ezekiel Slocumb, DO [facility]   If 7PM-7AM, please contact night-coverage [email or web address] 10/22/2020, 8:33 AM

## 2020-10-22 NOTE — Evaluation (Signed)
Physical Therapy Evaluation Patient Details Name: Chelsea [name] MRN: [id number] DOB: 01-11-1946 Today's Date: 10/22/2020   History of Present Illness  75 y.o. African-American female with a known history of hypertension, dyslipidemia, seizure disorder, and anxiety and osteoarthritis, who presented to the emergency room with acute onset of worsening generalized weakness. The patient had fall ~36 hours prior to arrival, apparently layed on the floor t/o that time.  Clinical Impression  Pt pleasant t/o session and willing to participate with PT. She reports feeling relatively close to her baseline with some minimal fatigue with ~75 ft of ambulation in room with walker.  She showed some impulsivity with getting outside of walker during turns/transitions, but had no LOBs or overt safety issues.  Pt states she uses a 3WW at home that is easier to turn/maneuver.  Overall pt showed ability to safely return home to her independent living facility, she will benefit from continued PT at discharge to work back to her baseline level of activity.      Follow Up Recommendations Home health PT (per progress and pt preferance)    Equipment Recommendations  None recommended by PT    Recommendations for Other Services       Precautions / Restrictions Precautions Precautions: Fall Restrictions Weight Bearing Restrictions: No      Mobility  Bed Mobility Overal bed mobility: Modified Independent             General bed mobility comments: Pt able to get up to EOB w/o assist, minimal extra time and UE use    Transfers Overall transfer level: Modified independent Equipment used: Rolling walker (2 wheeled)             General transfer comment: Pt needed heavy UE use to get to standing, but was able to do so w/o direct physical assist  Ambulation/Gait Ambulation/Gait assistance: Supervision Gait Distance (Feet): 65 Feet Assistive device: Rolling walker (2 wheeled)       General Gait  Details: Pt showed good overall confidence with in-room ambulation, she did need reminders to stay inside walker during turns but she did not have any LOBs or overt safety issues.  Minimal fatigue, but pt reports feeling relatively close to her baseline.  Stairs            Wheelchair Mobility    Modified Rankin (Stroke Patients Only)       Balance Overall balance assessment: Modified Independent                                           Pertinent Vitals/Pain Pain Assessment: No/denies pain    Home Living Family/patient expects to be discharged to:: Other (Comment)                 Additional Comments: [facility]    Prior Function Level of Independence: Independent with assistive device(s)         Comments: Pt reports that she drives and stays relatively active     Hand Dominance        Extremity/Trunk Assessment   Upper Extremity Assessment Upper Extremity Assessment: Generalized weakness    Lower Extremity Assessment Lower Extremity Assessment: Generalized weakness       Communication   Communication: No difficulties  Cognition Arousal/Alertness: Awake/alert Behavior During Therapy: WFL for tasks assessed/performed Overall Cognitive Status: Within Functional Limits for tasks assessed  General Comments      Exercises     Assessment/Plan    PT Assessment Patient needs continued PT services  PT Problem List Decreased activity tolerance;Decreased strength;Decreased balance;Decreased knowledge of use of DME;Decreased safety awareness       PT Treatment Interventions DME instruction;Gait training;Therapeutic activities;Therapeutic exercise;Functional mobility training;Balance training;Patient/family education    PT Goals (Current goals can be found in the Care Plan section)  Acute Rehab PT Goals Patient Stated Goal: Go home PT Goal Formulation:  With patient Time For Goal Achievement: 11/05/20 Potential to Achieve Goals: Good    Frequency Min 2X/week   Barriers to discharge        Co-evaluation               AM-PAC PT "6 Clicks" Mobility  Outcome Measure Help needed turning from your back to your side while in a flat bed without using bedrails?: None Help needed moving from lying on your back to sitting on the side of a flat bed without using bedrails?: None Help needed moving to and from a bed to a chair (including a wheelchair)?: None Help needed standing up from a chair using your arms (e.g., wheelchair or bedside chair)?: None Help needed to walk in hospital room?: A Little Help needed climbing 3-5 steps with a railing? : A Little 6 Click Score: 22    End of Session   Activity Tolerance: Patient tolerated treatment well Patient left: with call bell/phone within reach;in bed Nurse Communication: Mobility status PT Visit Diagnosis: Muscle weakness (generalized) (M62.81);Difficulty in walking, not elsewhere classified (R26.2)    Time: 5625-6389 PT Time Calculation (min) (ACUTE ONLY): 25 min   Charges:   PT Evaluation $PT Eval Low Complexity: 1 Low PT Treatments $Gait Training: 8-22 mins        Kreg Shropshire, DPT 10/22/2020, 11:05 AM

## 2020-10-22 NOTE — ED Notes (Addendum)
She is currently lying in bed - talking on her cellphone  NAD observed  Introduced myself to her  Plan of care discussed including pending PT eval  Social work consult is in progress   Continue to monitor

## 2020-10-22 NOTE — ED Notes (Signed)
Update provided to her daughter Chelsea [name]

## 2020-10-22 NOTE — H&P (Addendum)
[facility]   PATIENT NAME: Chelsea [name]    MR#:  [id number]  DATE OF BIRTH:  May 31, 1946  DATE OF ADMISSION:  10/21/2020  PRIMARY CARE PHYSICIAN: Verl Bangs, FNP   REQUESTING/REFERRING PHYSICIAN: Lurline Hare, MD  CHIEF COMPLAINT:   Chief Complaint  Patient presents with  . Fall  . Weakness    HISTORY OF PRESENT ILLNESS:  Chelsea [name]  is a 75 y.o. African-American female with a known history of hypertension, dyslipidemia, seizure disorder, and anxiety and osteoarthritis, who presented to the emergency room with acute onset of worsening generalized weakness. The patient had fall 36 hours ago without injuries.  She was apparently in the bathroom and was trying to get to a stool in lost balance falling.  No head injuries.  She admits to dry cough without wheezing or dyspnea.  No chest pain or palpitations.  No nausea or vomiting or abdominal pain.  Loss of taste or appetite.  No paresthesias or focal muscle weakness.  She denies any presyncope or syncope with her fall.  Upon presentation to the emergency room, vital signs were within normal and pulse symmetry was 93% on room air. Labs revealed unremarkable CMP and CBC. CK was 1600 and later 1000, high-sensitivity troponin was 13 and later 11. COVID PCR came back positive and influenza antigens came back negative. Chest x-ray showed ill-defined patchy opacities of the left lung base that could reflect atelectasis or developing infection, background of chronic interstitial and bronchitic changes and 2 small nodular opacities in the lungs as well as the periphery of the right midlung which may have been present in comparison and may reflect sequela of granulomatous disease.  The patient had a brain MRI that was negative for acute CVA.  It showed degenerative spondylosis at C3-C4 with moderate spinal stenosis and moderate chronic microvascular ischemic disease.  The patient was given 1 L bolus of IV normal saline followed 150  mill per hour, 1 g of p.o. Tylenol and IV remdesivir. She will be admitted to a medical monitored isolation bed for further evaluation and management.  PAST MEDICAL HISTORY:   Past Medical History:  Diagnosis Date  . Anxiety   . Arthritis   . Depression   . Hyperlipidemia   . Hypertension   . Seizures ([place])   . Spinal stenosis   . Ulcer   . Urinary incontinence     PAST SURGICAL HISTORY:   Past Surgical History:  Procedure Laterality Date  . ABDOMINAL HYSTERECTOMY    . BACK SURGERY     due to polio  . BREAST BIOPSY Bilateral    neg  . BREAST BIOPSY Right 2011   neg/stereo  . CARPAL TUNNEL RELEASE    . CATARACT EXTRACTION Right 2020    SOCIAL HISTORY:   Social History   Tobacco Use  . Smoking status: Never Smoker  . Smokeless tobacco: Never Used  Substance Use Topics  . Alcohol use: Never    FAMILY HISTORY:   Family History  Problem Relation Age of Onset  . Arthritis Mother   . Heart disease Mother   . Stroke Mother   . Hypertension Mother   . COPD Mother   . Sudden death Sister   . Other Father        unknown medical history    DRUG ALLERGIES:   Allergies  Allergen Reactions  . Penicillins     "Everything turned black"    REVIEW OF SYSTEMS:   ROS  As per history of present illness. All pertinent systems were reviewed above. Constitutional, HEENT, cardiovascular, respiratory, GI, GU, musculoskeletal, neuro, psychiatric, endocrine, integumentary and hematologic systems were reviewed and are otherwise negative/unremarkable except for positive findings mentioned above in the HPI.   MEDICATIONS AT HOME:   Prior to Admission medications   Medication Sig Start Date End Date Taking? Authorizing Provider  ALPRAZolam Duanne Moron) 0.5 MG tablet Take 1 tablet (0.5 mg total) by mouth 3 (three) times daily as needed for anxiety. 07/01/20   Malfi, Lupita Raider, FNP  aspirin 325 MG tablet Take 325 mg by mouth daily.    [provider]  atorvastatin (LIPITOR)  40 MG tablet Take 1 tablet (40 mg total) by mouth daily. 02/28/20   Malfi, Lupita Raider, FNP  Cyanocobalamin (VITAMIN B 12 PO) Take 500 mcg by mouth daily.    [provider]  FLUoxetine (PROZAC) 20 MG capsule TAKE 3 CAPSULES BY MOUTH EVERY DAY Patient taking differently: Take 60 mg by mouth daily. 10/01/20   Malfi, Lupita Raider, FNP  gabapentin (NEURONTIN) 100 MG capsule Take 200 mg by mouth daily. 10/06/20   [provider]  triamterene-hydrochlorothiazide (MAXZIDE-25) 37.5-25 MG tablet TAKE 1 TABLET BY MOUTH EVERY DAY 10/07/20   Malfi, Lupita Raider, FNP  simvastatin (ZOCOR) 40 MG tablet TAKE 1 TABLET BY MOUTH EVERY DAY 10/30/19 02/28/20  Karamalegos, Devonne Doughty, DO      VITAL SIGNS:  Blood pressure 122/66, pulse 89, temperature 98.3 F (36.8 C), temperature source Oral, resp. rate 18, height 4\' 9"  (1.448 m), weight 83.9 kg, SpO2 97 %.  PHYSICAL EXAMINATION:  Physical Exam  GENERAL:  75 y.o.-year-old African-American female patient lying in the bed with no acute distress.  EYES: Pupils equal, round, reactive to light and accommodation. No scleral icterus. Extraocular muscles intact.  HEENT: Head atraumatic, normocephalic. Oropharynx and nasopharynx clear.  NECK:  Supple, no jugular venous distention. No thyroid enlargement, no tenderness.  LUNGS: Slight diminished left basal breath sounds with minimal crackles. CARDIOVASCULAR: Regular rate and rhythm, S1, S2 normal. No murmurs, rubs, or gallops.  ABDOMEN: Soft, nondistended, nontender. Bowel sounds present. No organomegaly or mass.  EXTREMITIES: No pedal edema, cyanosis, or clubbing.  NEUROLOGIC: Cranial nerves II through XII are intact. Muscle strength 5/5 in all extremities. Sensation intact. Gait not checked.  PSYCHIATRIC: The patient is alert and oriented x 3.  Normal affect and good eye contact. SKIN: No obvious rash, lesion, or ulcer.   LABORATORY PANEL:   CBC Recent Labs  Lab 10/21/20 1214  WBC 6.0  HGB 11.9*  HCT 38.0   PLT 233   ------------------------------------------------------------------------------------------------------------------  Chemistries  Recent Labs  Lab 10/21/20 1214  NA 143  K 3.7  CL 100  CO2 30  GLUCOSE 102*  BUN 22  CREATININE 0.77  CALCIUM 9.7  AST 60*  ALT 35  ALKPHOS 72  BILITOT 1.1   ------------------------------------------------------------------------------------------------------------------  Cardiac Enzymes No results for input(s): TROPONINI in the last 168 hours. ------------------------------------------------------------------------------------------------------------------  RADIOLOGY:  CT Head Wo Contrast  Result Date: 10/21/2020 CLINICAL DATA:  Fall 2 days ago, initial encounter EXAM: CT HEAD WITHOUT CONTRAST TECHNIQUE: Contiguous axial images were obtained from the base of the skull through the vertex without intravenous contrast. COMPARISON:  07/08/2019 FINDINGS: Brain: No evidence of acute infarction, hemorrhage, hydrocephalus, extra-axial collection or mass lesion/mass effect. Mild chronic white matter ischemic changes are seen. Vascular: No hyperdense vessel or unexpected calcification. Skull: Normal. Negative for fracture or focal lesion. Sinuses/Orbits: No acute finding. Other:  None. IMPRESSION: No acute intracranial abnormality noted. Electronically Signed   By: Inez Catalina M.D.   On: 10/21/2020 16:05   MR BRAIN WO CONTRAST  Result Date: 10/21/2020 CLINICAL DATA:  Initial evaluation for acute right-sided weakness. EXAM: MRI HEAD WITHOUT CONTRAST TECHNIQUE: Multiplanar, multiecho pulse sequences of the brain and surrounding structures were obtained without intravenous contrast. COMPARISON:  Prior CT from earlier same day. FINDINGS: Brain: Examination degraded by motion artifact. Cerebral volume within normal limits for age. Patchy T2/FLAIR hyperintensity within the periventricular and deep white matter both cerebral hemispheres most consistent with  chronic small vessel ischemic disease, moderate in nature. Associated few scattered chronic lacunar infarcts noted at the periventricular white matter of the right frontal corona radiata. Patchy involvement of the pons noted. No abnormal foci of restricted diffusion to suggest acute or subacute ischemia. Gray-white matter differentiation maintained. No encephalomalacia to suggest chronic cortical infarction. Single punctate chronic microhemorrhage noted at the level of the left lentiform nucleus, likely small vessel related. No other evidence for acute or chronic intracranial hemorrhage. No mass lesion, midline shift or mass effect. No hydrocephalus or extra-axial fluid collection. No appreciable intrinsic temporal lobe abnormality. Pituitary gland suprasellar region normal. Midline structures intact. Vascular: Major intracranial vascular flow voids are maintained. Skull and upper cervical spine: Craniocervical junction within normal limits. Degenerative spondylosis noted at C3-4 with suspected moderate spinal stenosis, incompletely assessed on this exam (series 9, image 13). Bone marrow signal intensity within normal limits. No scalp soft tissue abnormality. Sinuses/Orbits: Globes and orbital soft tissues demonstrate no acute finding. Patient status post ocular lens replacement on the right. Mild scattered mucosal thickening noted within the ethmoidal air cells. Paranasal sinuses are otherwise clear. No mastoid effusion. Inner ear structures grossly normal. Other: None. IMPRESSION: 1. No acute intracranial abnormality. 2. Moderate chronic microvascular ischemic disease. 3. Degenerative spondylosis at C3-4 with suspected moderate spinal stenosis, incompletely assessed on this exam. Finding could be further assessed with dedicated MRI of the cervical spine as clinically warranted. Electronically Signed   By: Jeannine Boga M.D.   On: 10/21/2020 23:12   DG Chest Port 1 View  Result Date: 10/22/2020 CLINICAL  DATA:  COVID-19, weakness EXAM: PORTABLE CHEST 1 VIEW COMPARISON:  Radiograph 07/08/2019 FINDINGS: There are chronically coarsened interstitial and bronchitic changes in the lungs. Some ill-defined patchy opacities present in the left lung base. Two small nodular opacities are present in the lungs as well in the periphery of the left lower lung in the periphery of the right mid lung, incompletely characterized on this exam. No pneumothorax. No visible effusion. No acute osseous or soft tissue abnormality. Degenerative changes are present in the imaged spine and shoulders. IMPRESSION: 1. Ill-defined patchy opacities in the left lung base could reflect atelectasis or developing infection. 2. Background of chronic interstitial and bronchitic changes. 3. Two small nodular opacities in the lungs as well as the periphery of the right mid lung, which may have been present on comparison and may reflect sequela of granulomatous disease though could be better characterized with cross-sectional imaging. Electronically Signed   By: Lovena Le M.D.   On: 10/22/2020 02:07      IMPRESSION AND PLAN:   1. Multifocal pneumonia secondary to COVID-19. -The patient will be admitted to an isolation monitored bed with droplet and contact precautions. -Given multifocal pneumonia we will empirically place the patient on IV Rocephin and Zithromax for possible bacterial superinfection only with elevated Procalcitonin. -The patient will be placed on scheduled Mucinex and  as needed Tussionex. -We will avoid nebulization as much as we can, give bronchodilator MDI if needed, and with deterioration of oxygenation try to avoid BiPAP/CPAP if possible.    -Will obtain sputum Gram stain culture and sensitivity and follow blood cultures. -O2 protocol will be followed. -We will follow CRP, ferritin, LDH and D-dimer. -Will follow manual differential for ANC/ALC ratio as well as follow troponin I and daily CBC with manual differential and  CMP. - Will place the patient on IV Remdesivir and IV steroid therapy with IV Solu-Medrol with elevated inflammatory markers. -The patient will be placed on vitamin D3, vitamin C, zinc sulfate, p.o. Pepcid and aspirin.  2. Acute rhabdomyolysis secondary to mechanical fall. - The patient be hydrated with IV normal saline and will follow his CK.  3. Dyslipidemia. - Statin therapy will be resumed.  4. Essential hypertension. -We will hold off And she will be placed on as needed IV labetalol.  5. Anxiety and depression. - We will continue Prozac and Xanax.  6. Seizure disorder.. - We will continue Neurontin.  7. Vitamin B12 deficiency. - We will continue vitamin B12  8. DVT prophylaxis. - Subcutaneous Lovenox.   All the records are reviewed and case discussed with ED provider. The plan of care was discussed in details with the patient (and family). I answered all questions. The patient agreed to proceed with the above mentioned plan. Further management will depend upon hospital course.   CODE STATUS: Full code  Status is: Inpatient  Remains inpatient appropriate because:Ongoing diagnostic testing needed not appropriate for outpatient work up, Unsafe d/c plan, IV treatments appropriate due to intensity of illness or inability to take PO and Inpatient level of care appropriate due to severity of illness   Dispo: The patient is from: Home              Anticipated d/c is to: Home              Anticipated d/c date is: > 3 days              Patient currently is not medically stable to d/c.   TOTAL TIME TAKING CARE OF THIS PATIENT: 55 minutes.    Christel Mormon M.D on 10/22/2020 at 3:23 AM  [facility]   From 7 PM-7 AM, contact night-coverage [email or web address]  CC: Primary care physician; Malfi, Lupita Raider, FNP

## 2020-10-22 NOTE — ED Notes (Signed)
Pt with very large, hard stool

## 2020-10-22 NOTE — Progress Notes (Signed)
Remdesivir - Pharmacy Brief Note   A/P:  Remdesivir 200 mg IVPB once followed by 100 mg IVPB daily x 4 days.   Deshea Pooley, PharmD Clinical Pharmacist  

## 2020-10-23 DIAGNOSIS — F419 Anxiety disorder, unspecified: Secondary | ICD-10-CM

## 2020-10-23 DIAGNOSIS — I10 Essential (primary) hypertension: Secondary | ICD-10-CM

## 2020-10-23 DIAGNOSIS — F32A Depression, unspecified: Secondary | ICD-10-CM

## 2020-10-23 LAB — COMPREHENSIVE METABOLIC PANEL
ALT: 32 U/L (ref 0–44)
AST: 43 U/L — ABNORMAL HIGH (ref 15–41)
Albumin: 3 g/dL — ABNORMAL LOW (ref 3.5–5.0)
Alkaline Phosphatase: 60 U/L (ref 38–126)
Anion gap: 8 (ref 5–15)
BUN: 18 mg/dL (ref 8–23)
CO2: 28 mmol/L (ref 22–32)
Calcium: 8.9 mg/dL (ref 8.9–10.3)
Chloride: 106 mmol/L (ref 98–111)
Creatinine, Ser: 0.76 mg/dL (ref 0.44–1.00)
GFR, Estimated: >60 mL/min (ref >60)
Glucose, Bld: 100 mg/dL — ABNORMAL HIGH (ref 70–99)
Potassium: 3.5 mmol/L (ref 3.5–5.1)
Sodium: 142 mmol/L (ref 135–145)
Total Bilirubin: 0.6 mg/dL (ref 0.3–1.2)
Total Protein: 6.4 g/dL — ABNORMAL LOW (ref 6.5–8.1)

## 2020-10-23 LAB — CBC WITH DIFFERENTIAL/PLATELET
Abs Immature Granulocytes: 0.05 10*3/uL (ref 0.00–0.07)
Basophils Absolute: 0 10*3/uL (ref 0.0–0.1)
Basophils Relative: 0 %
Eosinophils Absolute: 0 10*3/uL (ref 0.0–0.5)
Eosinophils Relative: 0 %
HCT: 32.1 % — ABNORMAL LOW (ref 36.0–46.0)
Hemoglobin: 10.2 g/dL — ABNORMAL LOW (ref 12.0–15.0)
Immature Granulocytes: 1 %
Lymphocytes Relative: 19 %
Lymphs Abs: 1.8 10*3/uL (ref 0.7–4.0)
MCH: 30 pg (ref 26.0–34.0)
MCHC: 31.8 g/dL (ref 30.0–36.0)
MCV: 94.4 fL (ref 80.0–100.0)
Monocytes Absolute: 1.1 10*3/uL — ABNORMAL HIGH (ref 0.1–1.0)
Monocytes Relative: 12 %
Neutro Abs: 6.5 10*3/uL (ref 1.7–7.7)
Neutrophils Relative %: 68 %
Platelets: 204 10*3/uL (ref 150–400)
RBC: 3.4 MIL/uL — ABNORMAL LOW (ref 3.87–5.11)
RDW: 14.2 % (ref 11.5–15.5)
WBC: 9.4 10*3/uL (ref 4.0–10.5)
nRBC: 0 % (ref 0.0–0.2)

## 2020-10-23 LAB — FERRITIN: Ferritin: 70 ng/mL (ref 11–307)

## 2020-10-23 LAB — CK
Total CK: 768 U/L — ABNORMAL HIGH (ref 38–234)
Total CK: 598 U/L — ABNORMAL HIGH (ref 38–234)

## 2020-10-23 LAB — FIBRIN DERIVATIVES D-DIMER (ARMC ONLY): Fibrin derivatives D-dimer (ARMC): 5154.96 ng/mL (FEU) — ABNORMAL HIGH (ref 0.00–499.00)

## 2020-10-23 LAB — PROCALCITONIN: Procalcitonin: <0.1 ng/mL

## 2020-10-23 LAB — C-REACTIVE PROTEIN: CRP: 0.8 mg/dL (ref <1.0)

## 2020-10-23 MED ORDER — SODIUM CHLORIDE 0.9 % IV SOLN: 100.0000 mg | Freq: Every day | INTRAVENOUS | Status: DC

## 2020-10-23 NOTE — Progress Notes (Signed)
Urine specimen is pending at this time. Pt needs clean catch. Perwick is in place and patient is informed that she will have to remove the device and be toileted at the bedside to obtain the sample.

## 2020-10-23 NOTE — Progress Notes (Signed)
1        [place] at [place] NAME: Chelsea [name]    MR#:  [id number]  DATE OF BIRTH:  03/11/1946  SUBJECTIVE:  CHIEF COMPLAINT:   Chief Complaint  Patient presents with  . Fall  . Weakness  Mainly complains of her back and neck pain which are chronic REVIEW OF SYSTEMS:  Review of Systems  Constitutional: Negative for diaphoresis, fever, malaise/fatigue and weight loss.  HENT: Negative for ear discharge, ear pain, hearing loss, nosebleeds, sore throat and tinnitus.   Eyes: Negative for blurred vision and pain.  Respiratory: Negative for cough, hemoptysis, shortness of breath and wheezing.   Cardiovascular: Negative for chest pain, palpitations, orthopnea and leg swelling.  Gastrointestinal: Negative for abdominal pain, blood in stool, constipation, diarrhea, heartburn, nausea and vomiting.  Genitourinary: Negative for dysuria, frequency and urgency.  Musculoskeletal: Positive for back pain and falls. Negative for myalgias.  Skin: Negative for itching and rash.  Neurological: Negative for dizziness, tingling, tremors, focal weakness, seizures, weakness and headaches.  Psychiatric/Behavioral: Negative for depression. The patient is not nervous/anxious.    DRUG ALLERGIES:   Allergies  Allergen Reactions  . Penicillins     "Everything turned black"   VITALS:  Blood pressure 117/78, pulse 68, temperature 97.6 F (36.4 C), resp. rate 20, height 4\' 9"  (1.448 m), weight 80.7 kg, SpO2 100 %. PHYSICAL EXAMINATION:  Physical Exam Constitutional:      Appearance: She is obese.  HENT:     Head: Normocephalic and atraumatic.  Eyes:     Extraocular Movements: EOM normal.     Conjunctiva/sclera: Conjunctivae normal.     Pupils: Pupils are equal, round, and reactive to light.  Neck:     Thyroid: No thyromegaly.     Trachea: No tracheal deviation.  Cardiovascular:     Rate and Rhythm: Normal rate and regular rhythm.     Heart sounds: Normal heart sounds.   Pulmonary:     Effort: Pulmonary effort is normal. No respiratory distress.     Breath sounds: Normal breath sounds. No wheezing.  Chest:     Chest wall: No tenderness.  Abdominal:     General: Bowel sounds are normal. There is no distension.     Palpations: Abdomen is soft.     Tenderness: There is no abdominal tenderness.  Musculoskeletal:        General: Normal range of motion.     Cervical back: Normal range of motion and neck supple.  Skin:    General: Skin is warm and dry.     Findings: No rash.  Neurological:     Mental Status: She is alert and oriented to person, place, and time.     Cranial Nerves: No cranial nerve deficit.    LABORATORY PANEL:  Female CBC Recent Labs  Lab 10/23/20 0408  WBC 9.4  HGB 10.2*  HCT 32.1*  PLT 204   ------------------------------------------------------------------------------------------------------------------ Chemistries  Recent Labs  Lab 10/23/20 0408  NA 142  K 3.5  CL 106  CO2 28  GLUCOSE 100*  BUN 18  CREATININE 0.76  CALCIUM 8.9  AST 43*  ALT 32  ALKPHOS 60  BILITOT 0.6   RADIOLOGY:  No results found. ASSESSMENT AND PLAN:  75 year old female with a known history of hypertension, hyperlipidemia, seizure disorder, anxiety and osteoarthritis is admitted for generalized weakness.  Reported fall 36 hours before admission without any injury.  Acute rhabdomyolysis secondary to mechanical fall CK improving with IV  hydration  Asymptomatic COVID-19 infection With normal procalcitonin we will hold off any antibiotics.  I will trend procalcitonin Patient is on room air so I will hold off any remdesivir or steroids at this time Pneumonia ruled out.  Patient does not have any fever or leukocytosis  Chronic spondylosis and moderate spinal stenosis Followed by Dr. Otilio Carpen neurology Continue lidocaine patch and muscle rub cream as needed  Essential hypertension Continue Maxzide  Anxiety and depression Continue Xanax,  Prozac, trazodone  Body mass index is 38.52 kg/m.      Status is: Inpatient  Remains inpatient appropriate because:Unsafe d/c plan   Dispo:  Patient From: Assisted Living Facility/[facility]  Planned Disposition: ALF with home health PT/OT  Expected discharge date: 10/25/2020  Medically stable for discharge: No monitor her symptoms overnight    DVT prophylaxis:       enoxaparin (LOVENOX) injection 40 mg Start: 10/22/20 1000     Family Communication: Updated patient's daughter over phone while in the room on 1/19   All the records are reviewed and case discussed with Care Management/Social Worker. Management plans discussed with the patient, family and they are in agreement.  CODE STATUS: Full Code  TOTAL TIME TAKING CARE OF THIS PATIENT: 35 minutes.   More than 50% of the time was spent in counseling/coordination of care: YES  POSSIBLE D/C IN 1-2 DAYS, DEPENDING ON CLINICAL CONDITION.   Max Sane M.D on 10/23/2020 at 3:33 PM  [facility]   CC: Primary care physician; Verl Bangs, FNP  Note: This dictation was prepared with Dragon dictation along with smaller phrase technology. Any transcriptional errors that result from this process are unintentional.

## 2020-10-23 NOTE — Progress Notes (Signed)
Physical Therapy Treatment Patient Details Name: Chelsea [name] MRN: [id number] DOB: March 20, 1946 Today's Date: 10/23/2020    History of Present Illness 75 y.o. African-American female with a known history of hypertension, dyslipidemia, seizure disorder, and anxiety and osteoarthritis, who presented to the emergency room with acute onset of worsening generalized weakness. The patient had fall ~36 hours prior to arrival, apparently layed on the floor t/o that time.    PT Comments    Pt continues to be quite loquacious t/o session but was able to show effective and (relatively) safe mobility during today's session.  She was able to walk ~350 ft with slow but confidence gait.  She did need frequent cuing for posture, walker positioning and to insure appropriate breathing but ultimately she is feeling much closer to her baseline.    Follow Up Recommendations  Home health PT     Equipment Recommendations  None recommended by PT    Recommendations for Other Services       Precautions / Restrictions Precautions Precautions: Fall Restrictions Weight Bearing Restrictions: No    Mobility  Bed Mobility Overal bed mobility: Modified Independent             General bed mobility comments: Pt did need some assist getting scooted up in bed, but was able to transition to/from supine/sitting w/o direct assist  Transfers Overall transfer level: Modified independent Equipment used: Rolling walker (2 wheeled)             General transfer comment: Pt needed heavy UE use and forward lean to get to standing, but was able to do so w/o direct physical assist  Ambulation/Gait Ambulation/Gait assistance: Supervision Gait Distance (Feet): 350 Feet Assistive device: Rolling walker (2 wheeled)       General Gait Details: Pt able to circumambulate the nurses' station X 2 with slow but confident gait.  She needed consistent reminders to keep walker from getting too far ahead of her and to  insure she was focusing on breathing, but ultimately she showed effective and safe prolonged ambulation   Stairs             Wheelchair Mobility    Modified Rankin (Stroke Patients Only)       Balance Overall balance assessment: Modified Independent                                          Cognition Arousal/Alertness: Awake/alert Behavior During Therapy: WFL for tasks assessed/performed Overall Cognitive Status: Within Functional Limits for tasks assessed                                        Exercises General Exercises - Lower Extremity Ankle Circles/Pumps: AROM;10 reps Long Arc Quad: Strengthening;10 reps Heel Slides: Strengthening;10 reps Hip ABduction/ADduction: Strengthening;10 reps Hip Flexion/Marching: Strengthening;10 reps    General Comments        Pertinent Vitals/Pain Pain Assessment: No/denies pain    Home Living                      Prior Function            PT Goals (current goals can now be found in the care plan section) Progress towards PT goals: Progressing toward goals    Frequency    Min  2X/week      PT Plan Current plan remains appropriate    Co-evaluation              AM-PAC PT "6 Clicks" Mobility   Outcome Measure  Help needed turning from your back to your side while in a flat bed without using bedrails?: None Help needed moving from lying on your back to sitting on the side of a flat bed without using bedrails?: None Help needed moving to and from a bed to a chair (including a wheelchair)?: None Help needed standing up from a chair using your arms (e.g., wheelchair or bedside chair)?: None Help needed to walk in hospital room?: A Little Help needed climbing 3-5 steps with a railing? : A Little 6 Click Score: 22    End of Session Equipment Utilized During Treatment: Gait belt Activity Tolerance: Patient tolerated treatment well Patient left: with call bell/phone  within reach;with bed alarm set Nurse Communication: Mobility status PT Visit Diagnosis: Muscle weakness (generalized) (M62.81);Difficulty in walking, not elsewhere classified (R26.2)     Time: 1320-1403 PT Time Calculation (min) (ACUTE ONLY): 43 min  Charges:  $Gait Training: 8-22 mins $Therapeutic Exercise: 8-22 mins $Therapeutic Activity: 8-22 mins                     Kreg Shropshire, DPT 10/23/2020, 4:44 PM

## 2020-10-23 NOTE — TOC Progression Note (Signed)
Transition of Care [facility]) - Progression Note    Patient Details  Name: Chelsea [name] MRN: [id number] Date of Birth: [birth year]  Transition of Care [facility]) CM/SW Contact  Shelbie Hutching, RN Phone Number: 10/23/2020, 2:29 PM  Clinical Narrative:    Patient is working well with PT and recommendations are for home health.  Patient's daughter, [name] is in agreement with home health and would like to use [facility] for PT and OT.  [name] is also thinking of hiring a home care agency to check in on her mother daily.  Plan for discharge tomorrow.  [name] or Denise's husband will pick the patient up tomorrow.    Expected Discharge Plan: [place] Barriers to Discharge: Continued Medical Work up  Expected Discharge Plan and Services Expected Discharge Plan: [place] In-house Referral: Clinical Social Work Discharge Planning Services: CM Consult Post Acute Care Choice: [place] arrangements for the past 2 months: [place]                                       Social Determinants of Health (SDOH) Interventions    Readmission Risk Interventions No flowsheet data found.

## 2020-10-23 NOTE — Progress Notes (Signed)
Pt has a 20g IV in her left AC that is only functioning positionally. Pt needs a new IV site due to IV pump continuing to beep. Pt declines restick and moving to her forearm at this time.

## 2020-10-23 NOTE — Progress Notes (Signed)
Chaplain Ryzen Deady did a follow up with Pt in room 111-A Chelsea [name].  Pt. requested prayer but stated she is busy trying to eat right now and asked if I could call her back. I will follow with Pt again later this afternoon.

## 2020-10-23 NOTE — Progress Notes (Signed)
Pt request to get out of bed into the chair. Pt is assisted from the low bed to the bedside chair. Chair alarm is activated. Call bell is within reach. Anti slip socks are on the patient feet. Pt agrees to call staff before attempting to transfer back to bed.

## 2020-10-24 DIAGNOSIS — G894 Chronic pain syndrome: Secondary | ICD-10-CM

## 2020-10-24 LAB — COMPREHENSIVE METABOLIC PANEL
ALT: 29 U/L (ref 0–44)
AST: 32 U/L (ref 15–41)
Albumin: 2.8 g/dL — ABNORMAL LOW (ref 3.5–5.0)
Alkaline Phosphatase: 51 U/L (ref 38–126)
Anion gap: 7 (ref 5–15)
BUN: 17 mg/dL (ref 8–23)
CO2: 29 mmol/L (ref 22–32)
Calcium: 7.9 mg/dL — ABNORMAL LOW (ref 8.9–10.3)
Chloride: 107 mmol/L (ref 98–111)
Creatinine, Ser: 0.78 mg/dL (ref 0.44–1.00)
GFR, Estimated: >60 mL/min (ref >60)
Glucose, Bld: 84 mg/dL (ref 70–99)
Potassium: 3.4 mmol/L — ABNORMAL LOW (ref 3.5–5.1)
Sodium: 143 mmol/L (ref 135–145)
Total Bilirubin: 0.7 mg/dL (ref 0.3–1.2)
Total Protein: 5.9 g/dL — ABNORMAL LOW (ref 6.5–8.1)

## 2020-10-24 LAB — CK: Total CK: 418 U/L — ABNORMAL HIGH (ref 38–234)

## 2020-10-24 LAB — CBC WITH DIFFERENTIAL/PLATELET
Abs Immature Granulocytes: 0.01 10*3/uL (ref 0.00–0.07)
Basophils Absolute: 0 10*3/uL (ref 0.0–0.1)
Basophils Relative: 1 %
Eosinophils Absolute: 0.1 10*3/uL (ref 0.0–0.5)
Eosinophils Relative: 2 %
HCT: 32.5 % — ABNORMAL LOW (ref 36.0–46.0)
Hemoglobin: 10.5 g/dL — ABNORMAL LOW (ref 12.0–15.0)
Immature Granulocytes: 0 %
Lymphocytes Relative: 44 %
Lymphs Abs: 2.3 10*3/uL (ref 0.7–4.0)
MCH: 30.3 pg (ref 26.0–34.0)
MCHC: 32.3 g/dL (ref 30.0–36.0)
MCV: 93.7 fL (ref 80.0–100.0)
Monocytes Absolute: 0.6 10*3/uL (ref 0.1–1.0)
Monocytes Relative: 12 %
Neutro Abs: 2.2 10*3/uL (ref 1.7–7.7)
Neutrophils Relative %: 41 %
Platelets: 193 10*3/uL (ref 150–400)
RBC: 3.47 MIL/uL — ABNORMAL LOW (ref 3.87–5.11)
RDW: 14.4 % (ref 11.5–15.5)
WBC: 5.3 10*3/uL (ref 4.0–10.5)
nRBC: 0 % (ref 0.0–0.2)

## 2020-10-24 LAB — URINE CULTURE

## 2020-10-24 LAB — PROCALCITONIN: Procalcitonin: <0.1 ng/mL

## 2020-10-24 LAB — FERRITIN: Ferritin: 58 ng/mL (ref 11–307)

## 2020-10-24 LAB — D-DIMER, QUANTITATIVE: D-Dimer, Quant: 1.31 ug/mL-FEU — ABNORMAL HIGH (ref 0.00–0.50)

## 2020-10-24 LAB — C-REACTIVE PROTEIN: CRP: 0.6 mg/dL (ref <1.0)

## 2020-10-24 MED ORDER — ALPRAZOLAM 0.5 MG PO TABS: 0.5000 mg | ORAL_TABLET | Freq: Three times a day (TID) | ORAL | 2 refills | Status: DC | PRN

## 2020-10-24 MED ORDER — LIDOCAINE 5 % EX PTCH: 1.0000 | MEDICATED_PATCH | CUTANEOUS | 0 refills | Status: DC

## 2020-10-24 NOTE — Progress Notes (Signed)
Discharge paperwork reviewed with patient. Verbalizes understanding of discharge plan and follow up care. All questions answered. Daughter called and updated via telephone.

## 2020-10-24 NOTE — TOC Transition Note (Signed)
Transition of Care [facility]) - CM/SW Discharge Note   Patient Details  Name: Chelsea [name] MRN: [id number] Date of Birth: 10-Jun-1946  Transition of [facility]) CM/SW Contact:  Shelbie Hutching, RN Phone Number: 10/24/2020, 11:16 AM   Clinical Narrative:    Patient has been medically cleared for discharge home with home health services.  Patient will return to [place].  [facility] will provide home health PT and OT, orders faxed to [phone].  OP palliative referral given to Zandra Abts with [facility].   Patient's daughter will be picking the patient up today.    Final next level of care: [place] Barriers to Discharge: Barriers Resolved   Patient Goals and CMS Choice Patient states their goals for this hospitalization and ongoing recovery are:: Go to SNF or have [place] started CMS Medicare.gov Compare Post Acute Care list provided to:: Patient Represenative (must comment) Choice offered to / list presented to : Adult Children  Discharge Placement                       Discharge Plan and Services In-house Referral: Clinical Social Work Discharge Planning Services: CM Consult Post Acute Care Choice: [place]          DME Arranged: N/A DME Agency: NA       HH Arranged: PT,OT [place] Agency: Other - See comment Secondary school teacher)        Social Determinants of Health (SDOH) Interventions     Readmission Risk Interventions No flowsheet data found.

## 2020-10-24 NOTE — Progress Notes (Signed)
[facility]) Hospital Liaison RN note:  Received new referral for [facility] out patient Palliative program to follow post discharge from [place], TOC. Patient information given to referral. Plan is for discharge back to [facility] today with [facility] for PT/OT.  Thank you for this referral.  Loney Laurence [facility] Liaison [phone]

## 2020-10-24 NOTE — Discharge Summary (Signed)
[place] at [place] NAME: Chelsea [name]    MR#:  [id number]  DATE OF BIRTH:  December 23, 1945  DATE OF ADMISSION:  10/21/2020   ADMITTING PHYSICIAN: Christel Mormon, MD  DATE OF DISCHARGE: 10/24/2020  PRIMARY CARE PHYSICIAN: Verl Bangs, FNP   ADMISSION DIAGNOSIS:  Rhabdomyolysis [M62.82] Weakness [R53.1] Fall, initial encounter [W19.XXXA] Traumatic rhabdomyolysis, initial encounter ([place]) [T79.6XXA] COVID-19 [U07.1] DISCHARGE DIAGNOSIS:  Active Problems:   Rhabdomyolysis  SECONDARY DIAGNOSIS:   Past Medical History:  Diagnosis Date  . Anxiety   . Arthritis   . Depression   . Hyperlipidemia   . Hypertension   . Seizures ([place])   . Spinal stenosis   . Ulcer   . Urinary incontinence    HOSPITAL COURSE:  75 year old female with a known history of hypertension, hyperlipidemia, seizure disorder, anxiety and osteoarthritis is admitted for generalized weakness.  Reported fall 36 hours before admission without any injury.  Acute rhabdomyolysis secondary to mechanical fall CK improved with hydration and holding statin.  Statin can be resumed now at discharge  Asymptomatic COVID-19 infection Pneumonia ruled out.  Normal procalcitonin.  Patient not a candidate for remdesivir or steroid.  She remains on room air.  Chronic spondylosis and moderate spinal stenosis Recommend outpatient follow-up with Dr. Otilio Carpen neurology Continue lidocaine patch at discharge..  Consider neurosurgery and/or orthopedic evaluation as an outpatient.   DISCHARGE CONDITIONS:  Stable CONSULTS OBTAINED:   DRUG ALLERGIES:   Allergies  Allergen Reactions  . Penicillins     "Everything turned black"   DISCHARGE MEDICATIONS:   Allergies as of 10/24/2020      Reactions   Penicillins    "Everything turned black"      Medication List    TAKE these medications   ALPRAZolam 0.5 MG tablet Commonly known as: XANAX Take 1 tablet (0.5 mg total) by mouth 3  (three) times daily as needed for anxiety.   aspirin 325 MG tablet Take 325 mg by mouth daily.   atorvastatin 40 MG tablet Commonly known as: LIPITOR Take 1 tablet (40 mg total) by mouth daily.   FLUoxetine 20 MG capsule Commonly known as: PROZAC TAKE 3 CAPSULES BY MOUTH EVERY DAY   gabapentin 100 MG capsule Commonly known as: NEURONTIN Take 200 mg by mouth daily.   lidocaine 5 % Commonly known as: LIDODERM Place 1 patch onto the skin daily. Remove & Discard patch within 12 hours or as directed by MD   triamterene-hydrochlorothiazide 37.5-25 MG tablet Commonly known as: MAXZIDE-25 TAKE 1 TABLET BY MOUTH EVERY DAY   VITAMIN B 12 PO Take 500 mcg by mouth daily.      DISCHARGE INSTRUCTIONS:   DIET:  Cardiac diet DISCHARGE CONDITION:  Stable ACTIVITY:  Activity as tolerated OXYGEN:  Home Oxygen: No.  Oxygen Delivery: room air DISCHARGE LOCATION:  nursing home/[facility] with PT/OT and palliative care to follow  If you experience worsening of your admission symptoms, develop shortness of breath, life threatening emergency, suicidal or homicidal thoughts you must seek medical attention immediately by calling 911 or calling your MD immediately  if symptoms less severe.  You Must read complete instructions/literature along with all the possible adverse reactions/side effects for all the Medicines you take and that have been prescribed to you. Take any new Medicines after you have completely understood and accpet all the possible adverse reactions/side effects.   Please note  You were cared for by a hospitalist  during your hospital stay. If you have any questions about your discharge medications or the care you received while you were in the hospital after you are discharged, you can call the unit and asked to speak with the hospitalist on call if the hospitalist that took care of you is not available. Once you are discharged, your primary care physician will handle any  further medical issues. Please note that NO REFILLS for any discharge medications will be authorized once you are discharged, as it is imperative that you return to your primary care physician (or establish a relationship with a primary care physician if you do not have one) for your aftercare needs so that they can reassess your need for medications and monitor your lab values.    On the day of Discharge:  VITAL SIGNS:  Blood pressure 134/71, pulse 70, temperature 98.1 F (36.7 C), temperature source Oral, resp. rate 18, height 4\' 9"  (1.448 m), weight 80.7 kg, SpO2 98 %. PHYSICAL EXAMINATION:  GENERAL:  75 y.o.-year-old patient lying in the bed with no acute distress.  EYES: Pupils equal, round, reactive to light and accommodation. No scleral icterus. Extraocular muscles intact.  HEENT: Head atraumatic, normocephalic. Oropharynx and nasopharynx clear.  NECK:  Supple, no jugular venous distention. No thyroid enlargement, no tenderness.  LUNGS: Normal breath sounds bilaterally, no wheezing, rales,rhonchi or crepitation. No use of accessory muscles of respiration.  CARDIOVASCULAR: S1, S2 normal. No murmurs, rubs, or gallops.  ABDOMEN: Soft, non-tender, non-distended. Bowel sounds present. No organomegaly or mass.  EXTREMITIES: No pedal edema, cyanosis, or clubbing.  NEUROLOGIC: Cranial nerves II through XII are intact. Muscle strength 5/5 in all extremities. Sensation intact. Gait not checked.  PSYCHIATRIC: The patient is alert and oriented x 3.  SKIN: No obvious rash, lesion, or ulcer.  DATA REVIEW:   CBC Recent Labs  Lab 10/24/20 0402  WBC 5.3  HGB 10.5*  HCT 32.5*  PLT 193    Chemistries  Recent Labs  Lab 10/24/20 0402  NA 143  K 3.4*  CL 107  CO2 29  GLUCOSE 84  BUN 17  CREATININE 0.78  CALCIUM 7.9*  AST 32  ALT 29  ALKPHOS 51  BILITOT 0.7     Outpatient follow-up  Follow-up Information    Malfi, Lupita Raider, FNP.   Specialty: Family Medicine Contact  information: [place] [address] [phone]        Anabel Bene, MD. Schedule an appointment as soon as possible for a visit in 1 week(s).   Specialty: Neurology Contact information: [place] [facility] [place] [address] [phone]               30 Day Unplanned Readmission Risk Score   Flowsheet Row ED to Hosp-Admission (Current) from 10/21/2020 in [place] (1C)  30 Day Unplanned Readmission Risk Score (%) 13.81 Filed at 10/24/2020 0801     This score is the patient's risk of an unplanned readmission within 30 days of being discharged (0 -100%). The score is based on dignosis, age, lab data, medications, orders, and past utilization.   Low:  0-14.9   Medium: 15-21.9   High: 22-29.9   Extreme: 30 and above         Management plans discussed with the patient, family and they are in agreement.  CODE STATUS: Full Code   TOTAL TIME TAKING CARE OF THIS PATIENT: 45 minutes.    Max Sane M.D on  10/24/2020 at 10:59 AM  [facility]   CC: Primary care physician; Verl Bangs, FNP   Note: This dictation was prepared with Dragon dictation along with smaller phrase technology. Any transcriptional errors that result from this process are unintentional.

## 2020-10-25 ENCOUNTER — Telehealth: Payer: Self-pay | Admitting: Family Medicine

## 2020-10-25 NOTE — Telephone Encounter (Signed)
Home Health Verbal Orders - Caller/Agency: [facility] Callback Number: [phone] option 2 Requesting OT/PT/Skilled Nursing/Social Work/Speech Therapy: Requesting palliative care  Frequency:   Please advise

## 2020-10-28 ENCOUNTER — Emergency Department: Payer: Medicare Other

## 2020-10-28 ENCOUNTER — Inpatient Hospital Stay
Admission: EM | Admit: 2020-10-28 | Discharge: 2020-11-05 | DRG: 552 | Disposition: A | Discharge status: Home Health | Payer: Medicare Other | Attending: Internal Medicine | Admitting: Internal Medicine

## 2020-10-28 ENCOUNTER — Other Ambulatory Visit: Payer: Self-pay

## 2020-10-28 ENCOUNTER — Encounter: Payer: Self-pay | Admitting: Intensive Care

## 2020-10-28 ENCOUNTER — Observation Stay: Payer: Medicare Other

## 2020-10-28 DIAGNOSIS — R5381 Other malaise: Secondary | ICD-10-CM | POA: Diagnosis not present

## 2020-10-28 DIAGNOSIS — R54 Age-related physical debility: Secondary | ICD-10-CM | POA: Diagnosis not present

## 2020-10-28 DIAGNOSIS — Z8616 Personal history of COVID-19: Secondary | ICD-10-CM

## 2020-10-28 DIAGNOSIS — Z86010 Personal history of colonic polyps: Secondary | ICD-10-CM

## 2020-10-28 DIAGNOSIS — Z20822 Contact with and (suspected) exposure to covid-19: Secondary | ICD-10-CM | POA: Diagnosis present

## 2020-10-28 DIAGNOSIS — F419 Anxiety disorder, unspecified: Secondary | ICD-10-CM | POA: Diagnosis present

## 2020-10-28 DIAGNOSIS — W19XXXA Unspecified fall, initial encounter: Secondary | ICD-10-CM | POA: Diagnosis present

## 2020-10-28 DIAGNOSIS — Z90710 Acquired absence of both cervix and uterus: Secondary | ICD-10-CM

## 2020-10-28 DIAGNOSIS — M549 Dorsalgia, unspecified: Secondary | ICD-10-CM | POA: Diagnosis present

## 2020-10-28 DIAGNOSIS — Z8249 Family history of ischemic heart disease and other diseases of the circulatory system: Secondary | ICD-10-CM

## 2020-10-28 DIAGNOSIS — F411 Generalized anxiety disorder: Secondary | ICD-10-CM

## 2020-10-28 DIAGNOSIS — M6282 Rhabdomyolysis: Secondary | ICD-10-CM | POA: Diagnosis present

## 2020-10-28 DIAGNOSIS — Z880 Allergy status to penicillin: Secondary | ICD-10-CM

## 2020-10-28 DIAGNOSIS — E669 Obesity, unspecified: Secondary | ICD-10-CM | POA: Diagnosis present

## 2020-10-28 DIAGNOSIS — R296 Repeated falls: Secondary | ICD-10-CM | POA: Diagnosis present

## 2020-10-28 DIAGNOSIS — I10 Essential (primary) hypertension: Secondary | ICD-10-CM | POA: Diagnosis present

## 2020-10-28 DIAGNOSIS — M4802 Spinal stenosis, cervical region: Secondary | ICD-10-CM

## 2020-10-28 DIAGNOSIS — R531 Weakness: Secondary | ICD-10-CM

## 2020-10-28 DIAGNOSIS — Z7982 Long term (current) use of aspirin: Secondary | ICD-10-CM

## 2020-10-28 DIAGNOSIS — Z6838 Body mass index (BMI) 38.0-38.9, adult: Secondary | ICD-10-CM

## 2020-10-28 DIAGNOSIS — Z79899 Other long term (current) drug therapy: Secondary | ICD-10-CM

## 2020-10-28 DIAGNOSIS — E860 Dehydration: Secondary | ICD-10-CM | POA: Diagnosis present

## 2020-10-28 DIAGNOSIS — E785 Hyperlipidemia, unspecified: Secondary | ICD-10-CM | POA: Diagnosis present

## 2020-10-28 DIAGNOSIS — F32A Depression, unspecified: Secondary | ICD-10-CM | POA: Diagnosis present

## 2020-10-28 DIAGNOSIS — R350 Frequency of micturition: Secondary | ICD-10-CM | POA: Diagnosis present

## 2020-10-28 DIAGNOSIS — F5104 Psychophysiologic insomnia: Secondary | ICD-10-CM

## 2020-10-28 DIAGNOSIS — N390 Urinary tract infection, site not specified: Secondary | ICD-10-CM | POA: Diagnosis not present

## 2020-10-28 LAB — COMPREHENSIVE METABOLIC PANEL
ALT: 29 U/L (ref 0–44)
AST: 27 U/L (ref 15–41)
Albumin: 3.3 g/dL — ABNORMAL LOW (ref 3.5–5.0)
Alkaline Phosphatase: 70 U/L (ref 38–126)
Anion gap: 11 (ref 5–15)
BUN: 14 mg/dL (ref 8–23)
CO2: 32 mmol/L (ref 22–32)
Calcium: 9.3 mg/dL (ref 8.9–10.3)
Chloride: 99 mmol/L (ref 98–111)
Creatinine, Ser: 0.81 mg/dL (ref 0.44–1.00)
GFR, Estimated: >60 mL/min (ref >60)
Glucose, Bld: 86 mg/dL (ref 70–99)
Potassium: 4 mmol/L (ref 3.5–5.1)
Sodium: 142 mmol/L (ref 135–145)
Total Bilirubin: 0.8 mg/dL (ref 0.3–1.2)
Total Protein: 6.7 g/dL (ref 6.5–8.1)

## 2020-10-28 LAB — URINALYSIS, COMPLETE (UACMP) WITH MICROSCOPIC
Bacteria, UA: NONE SEEN
Bilirubin Urine: NEGATIVE
Glucose, UA: NEGATIVE mg/dL
Hgb urine dipstick: NEGATIVE
Ketones, ur: NEGATIVE mg/dL
Leukocytes,Ua: NEGATIVE
Nitrite: NEGATIVE
Protein, ur: NEGATIVE mg/dL
Specific Gravity, Urine: 1.014 (ref 1.005–1.030)
pH: 6 (ref 5.0–8.0)

## 2020-10-28 LAB — CBC WITH DIFFERENTIAL/PLATELET
Abs Immature Granulocytes: 0.02 10*3/uL (ref 0.00–0.07)
Basophils Absolute: 0 10*3/uL (ref 0.0–0.1)
Basophils Relative: 0 %
Eosinophils Absolute: 0.1 10*3/uL (ref 0.0–0.5)
Eosinophils Relative: 3 %
HCT: 36.3 % (ref 36.0–46.0)
Hemoglobin: 11.4 g/dL — ABNORMAL LOW (ref 12.0–15.0)
Immature Granulocytes: 0 %
Lymphocytes Relative: 40 %
Lymphs Abs: 1.8 10*3/uL (ref 0.7–4.0)
MCH: 30.2 pg (ref 26.0–34.0)
MCHC: 31.4 g/dL (ref 30.0–36.0)
MCV: 96 fL (ref 80.0–100.0)
Monocytes Absolute: 0.5 10*3/uL (ref 0.1–1.0)
Monocytes Relative: 12 %
Neutro Abs: 2 10*3/uL (ref 1.7–7.7)
Neutrophils Relative %: 45 %
Platelets: 217 10*3/uL (ref 150–400)
RBC: 3.78 MIL/uL — ABNORMAL LOW (ref 3.87–5.11)
RDW: 14.6 % (ref 11.5–15.5)
WBC: 4.6 10*3/uL (ref 4.0–10.5)
nRBC: 0 % (ref 0.0–0.2)

## 2020-10-28 LAB — CK: Total CK: 160 U/L (ref 38–234)

## 2020-10-28 IMAGING — MR MR CERVICAL SPINE W/O CM
5 series · 38 of 48 positions shown · non-contrast
Comparison: CT same day.

CLINICAL DATA: Myelopathy, acute or progressive.

EXAM:
MRI CERVICAL SPINE WITHOUT CONTRAST
TECHNIQUE: Multiplanar, multisequence MR imaging of the cervical spine was
performed. No intravenous contrast was administered.

[Series 5: T2 · sagittal · 3.0mm · 0.62mm/px · 8 of 15 slices shown (1 of 2)]
[im 1/15]
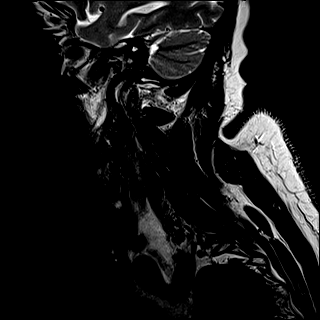
[im 3/15]
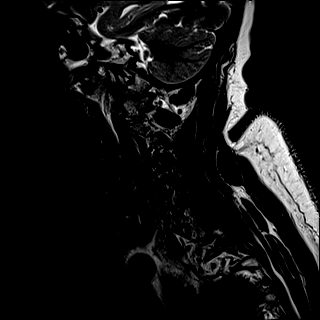
[im 5/15]
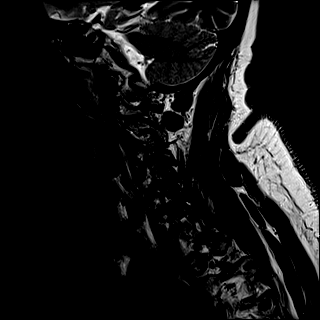
[im 7/15]
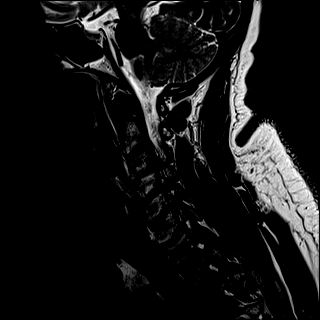
[im 9/15]
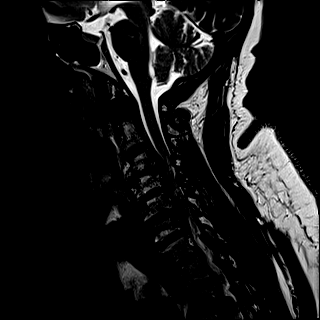
[im 11/15]
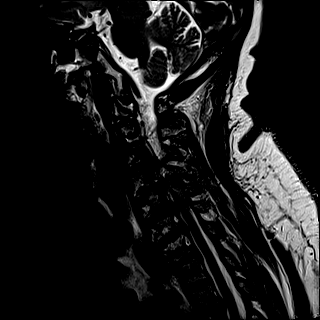
[im 13/15]
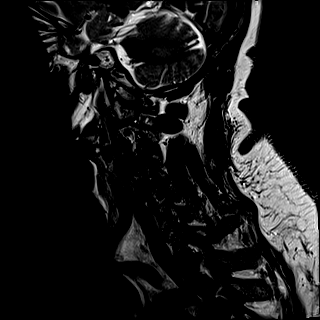
[im 15/15]
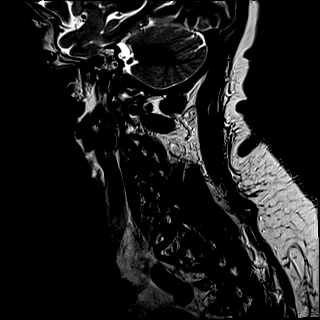

[Series 6: FLAIR · sagittal · 3.0mm · 0.78mm/px · 7 of 15 slices shown]
[im 1/15]
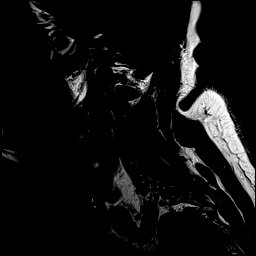
[im 3/15]
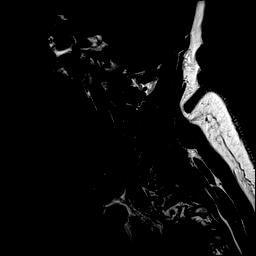
[im 5/15]
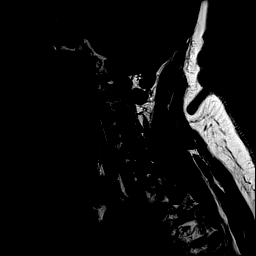
[im 8/15]
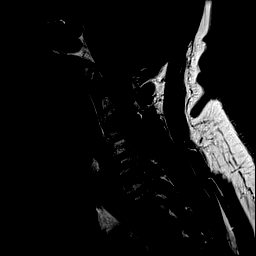
[im 10/15]
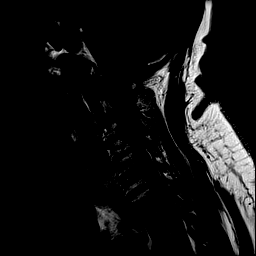
[im 12/15]
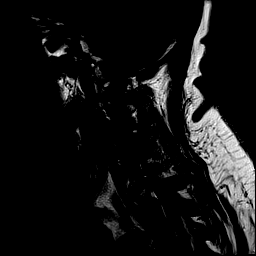
[im 15/15]
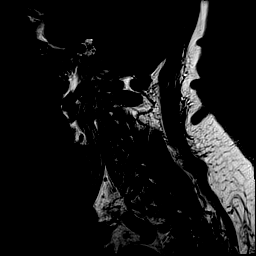

[Series 7: STIR · sagittal · 3.0mm · 0.62mm/px · 7 of 15 slices shown]
[im 1/15]
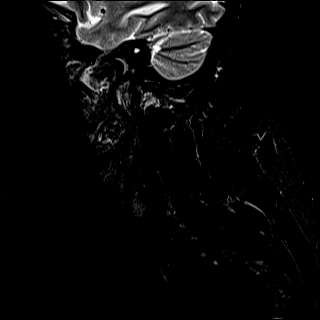
[im 3/15]
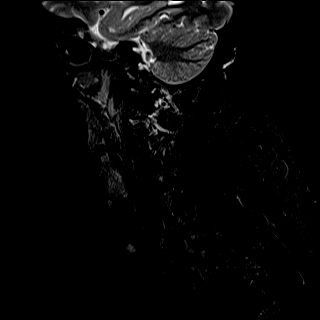
[im 5/15]
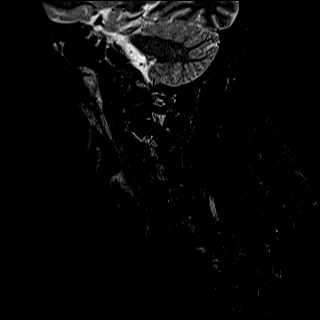
[im 8/15]
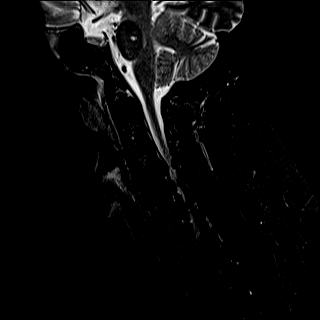
[im 10/15]
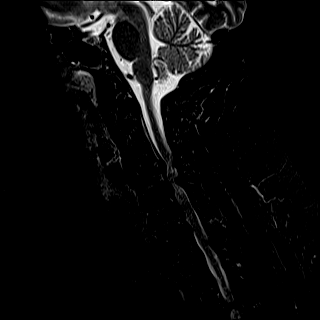
[im 12/15]
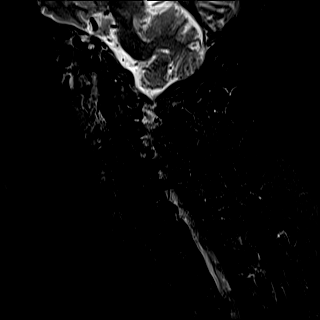
[im 15/15]
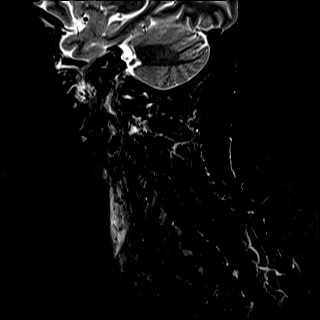

[Series 9: T2 · axial · 3.0mm · 0.70mm/px · z∈[-159,-78]mm · 9 of 27 slices shown (2 of 2)]
[im 1/27]
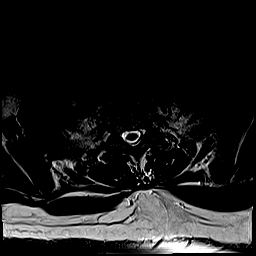
[im 5/27]
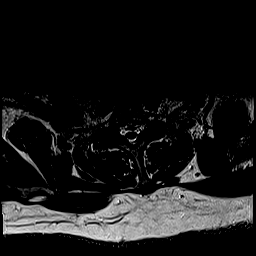
[im 9/27]
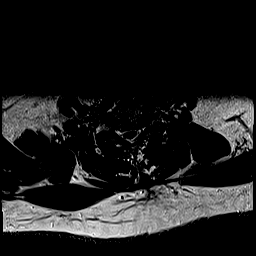
[im 11/27]
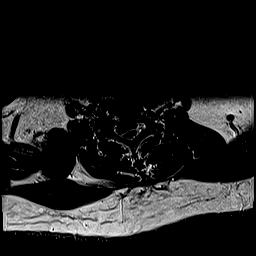
[im 14/27]
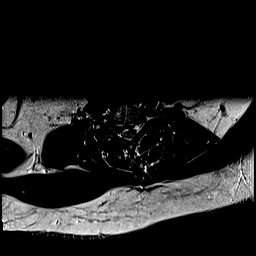
[im 16/27]
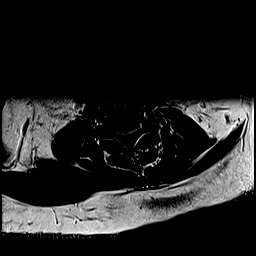
[im 18/27]
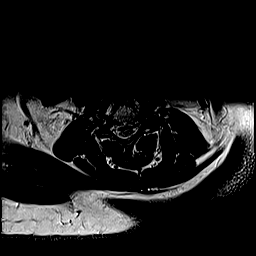
[im 22/27]
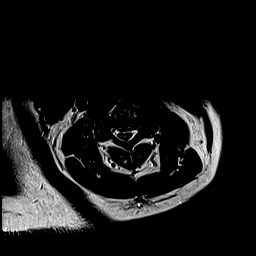
[im 27/27]
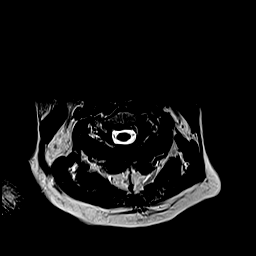

[Series 10: ax mpgr · axial · 3.0mm · 0.35mm/px · z∈[-159,-93]mm · 7 of 27 slices shown]
[im 1/27]
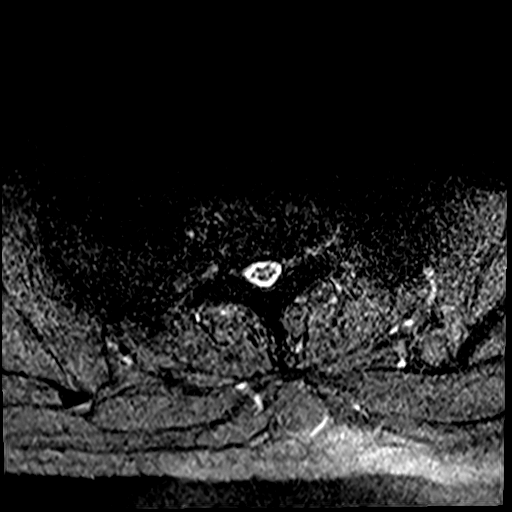
[im 5/27]
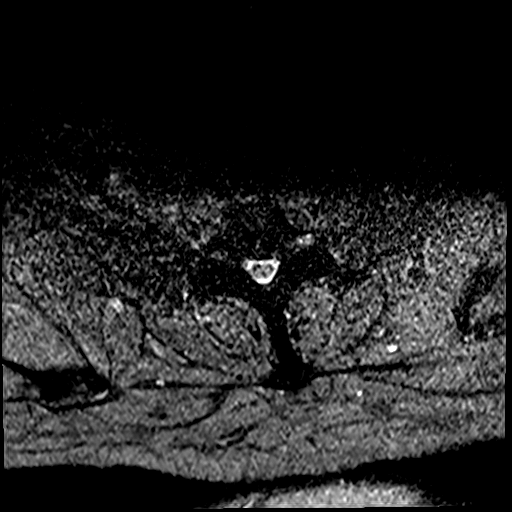
[im 9/27]
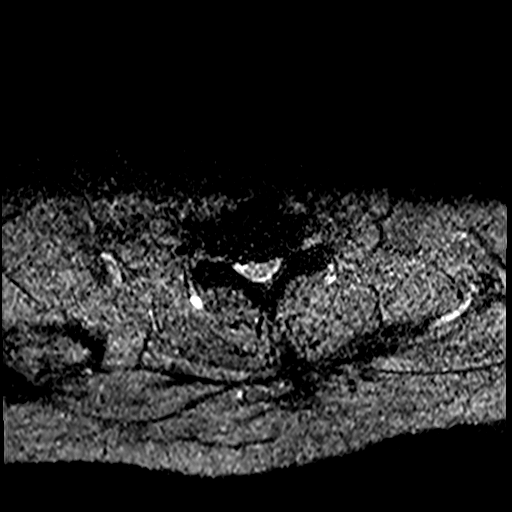
[im 11/27]
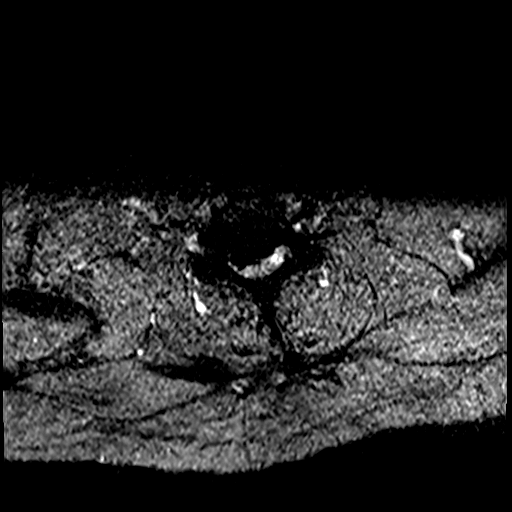
[im 16/27]
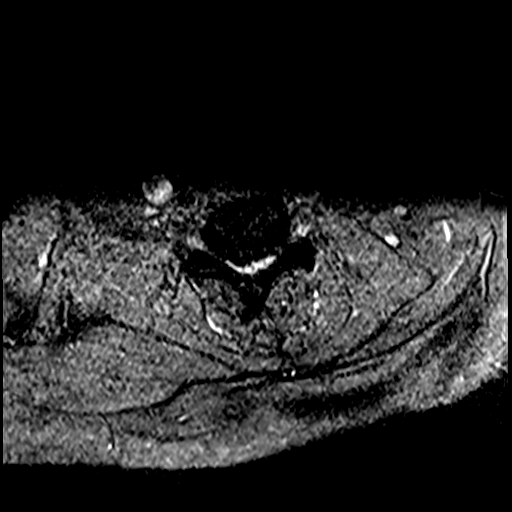
[im 18/27]
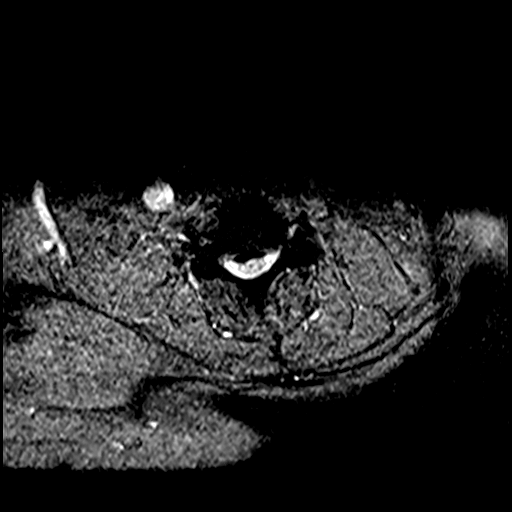
[im 22/27]
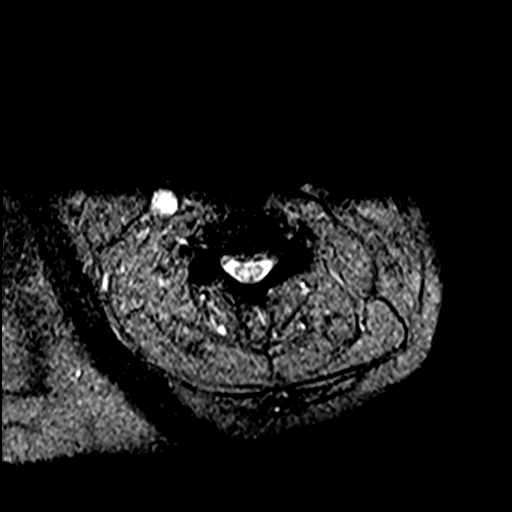

[38 of 48 positions shown; findings below may reference images not displayed]

FINDINGS: Alignment: Straightening of the normal cervical lordosis.

Vertebrae: No fracture or primary bone lesion. Spondylosis with
endplate osteophytes. Incomplete ossification of the posterior
longitudinal ligament from C3-T1, better shown by CT.

Cord: See below regarding stenosis.  No primary cord lesion.

Posterior Fossa, vertebral arteries, paraspinal tissues: Negative
except for old small vessel infarction of the pons.

Disc levels:

Foramen magnum is widely patent. Mild osteoarthritis at the C1-2
articulation but without encroachment upon the neural structures.

C2-3: Minimal disc bulge. Minimal facet arthritis. No canal or
foraminal stenosis.

C3-4: Severe spinal stenosis with AP diameter of the canal in the
midline only 4 mm. Cord flattening. Myelomalacia. Bilateral
foraminal stenosis.

C4-5: Severe spinal stenosis with AP diameter of the canal in the
midline only 3.5 mm. Cord flattening. Myelomalacia. Bilateral
foraminal stenosis.

C5-6: Spondylosis with endplate osteophytes, OPLL and probable
chronic calcified right posterolateral disc herniation. Spinal
stenosis with AP diameter in the midline 6.8 mm. Cord flattening
with myelomalacia. Foraminal stenosis on the right likely to
compress the right C6 nerve.

C6-7: Endplate osteophytes and bulging of the disc. AP diameter of
the canal in the midline 8.8 mm. No cord compression. Mild foraminal
narrowing on the right and moderate foraminal narrowing on the left.

C7-T1: Bulging of the disc. Facet degeneration. No compressive canal
or foraminal stenosis.

T1-2: Disc bulge.  Facet degeneration.  No compressive stenosis.
IMPRESSION: 1. Severe spinal stenosis with cord flattening and chronic
compressive myelomalacia at C3-4, C4-5 and C5-6. AP diameter of the
canal in the midline only 4 mm at the C3-4 level and 3.5 mm at C4-5
level. Diameter 6.8 mm at C5-6.
2. Ossification of the posterior longitudinal ligament from C3-T1,
better shown by CT.
3. Bilateral foraminal stenosis at C3-4 and C4-5 and right foraminal
stenosis at C5-6, all likely associated with nerve root compression.
4. Spondylosis at C6-7 with mild foraminal narrowing on the right
and moderate foraminal narrowing on the left.

## 2020-10-28 IMAGING — CT CT HEAD W/O CM
3 series · 15 of 47 positions shown, 18 images · non-contrast
Comparison: CT head [DATE]

CLINICAL DATA: Multiple falls.  Recent COVID infection

EXAM:
CT HEAD WITHOUT CONTRAST
CT CERVICAL SPINE WITHOUT CONTRAST
TECHNIQUE: Multidetector CT imaging of the head and cervical spine was
performed following the standard protocol without intravenous
contrast. Multiplanar CT image reconstructions of the cervical spine
were also generated.

[Series 2: head wo · axial · 0.39mm/px · z∈[-154,-29]mm · 9 of 31 slices shown, 12 images]
[im 3/31  brain]
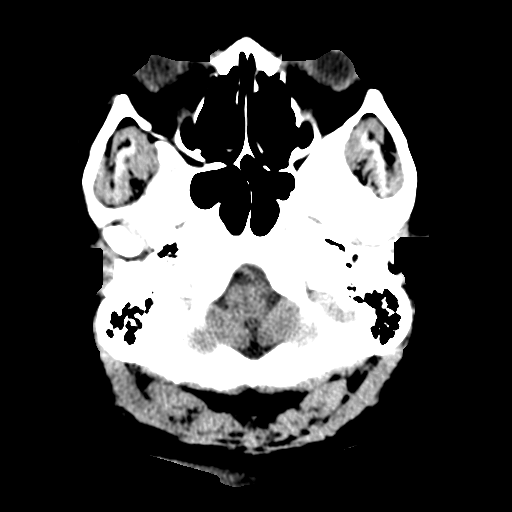
[im 3/31  bone]
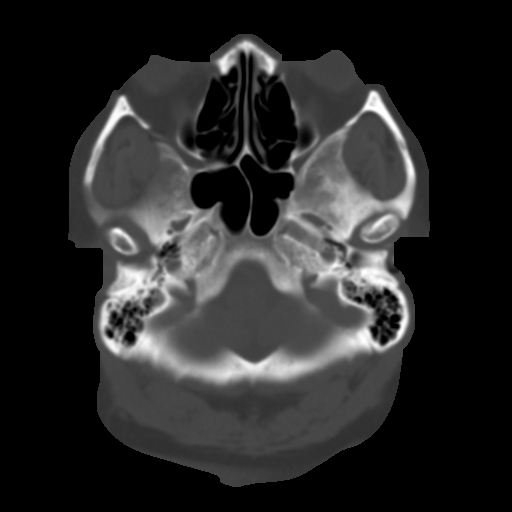
[im 6/31  brain]
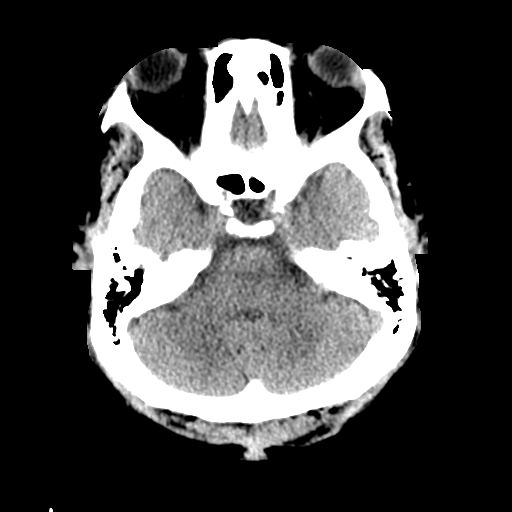
[im 9/31  brain]
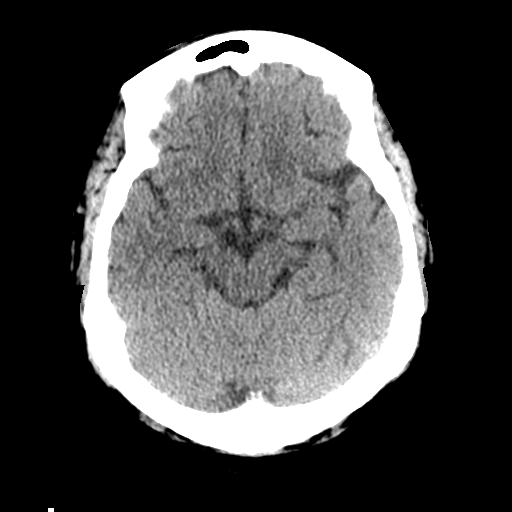
[im 12/31  brain]
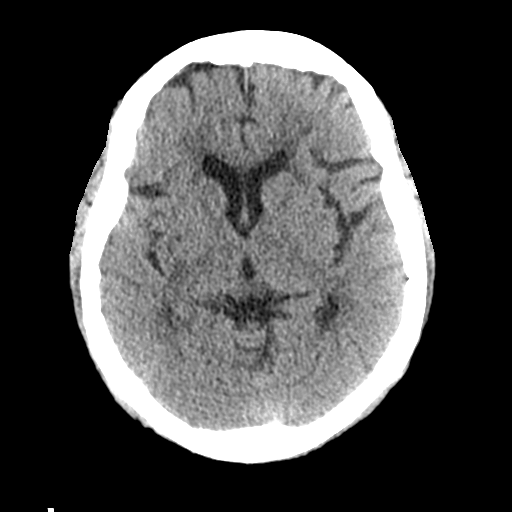
[im 16/31  brain]
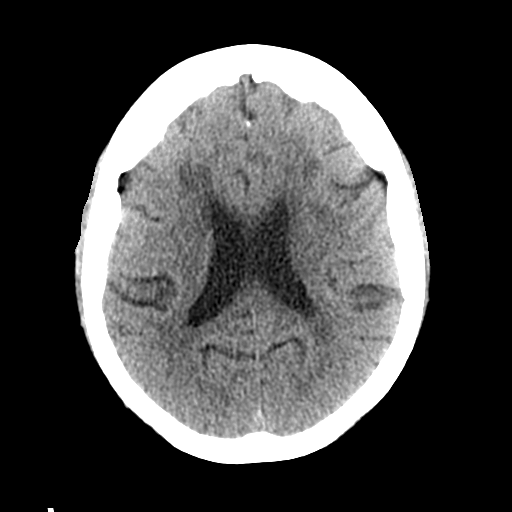
[im 16/31  bone]
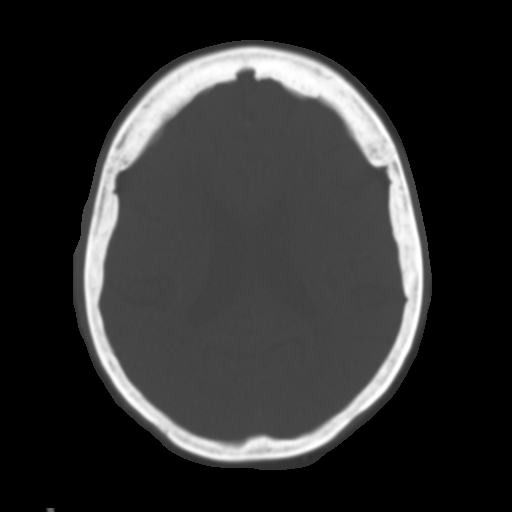
[im 19/31  brain]
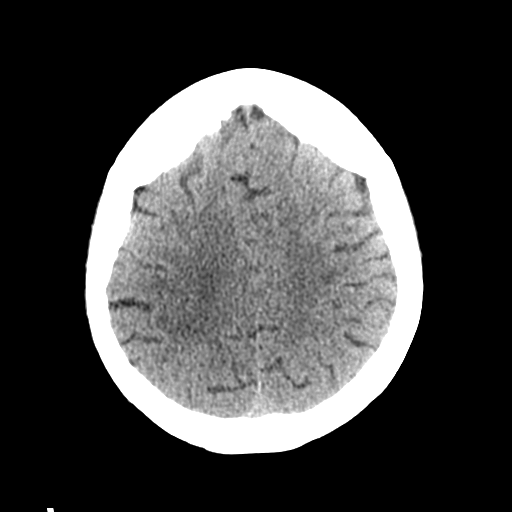
[im 22/31  brain]
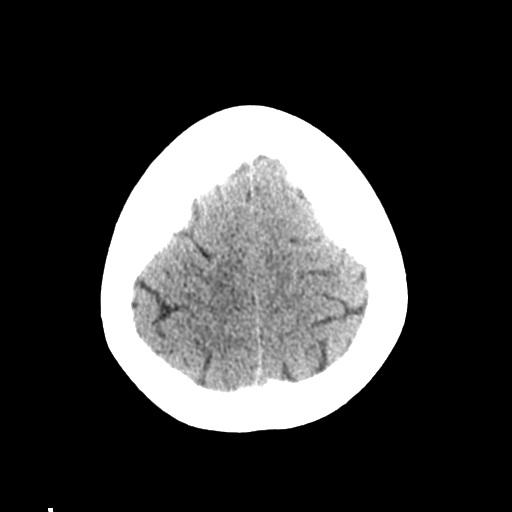
[im 25/31  brain]
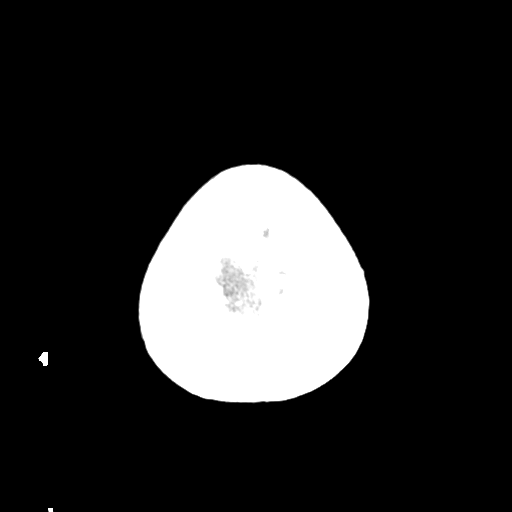
[im 28/31  brain]
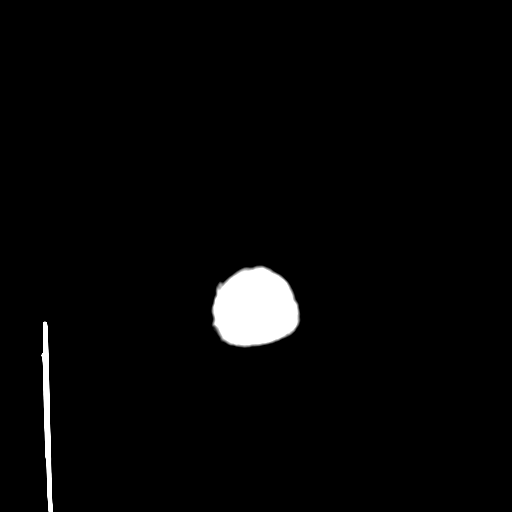
[im 28/31  bone]
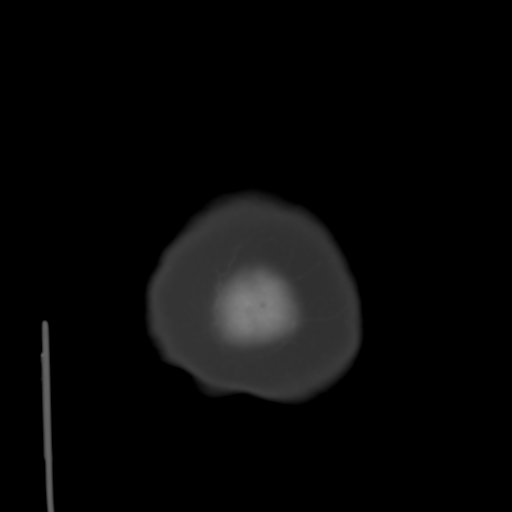

[Series 4: coronal soft tissue · coronal · 0.30mm/px · 3 of 62 slices shown]
[im 21/62  brain]
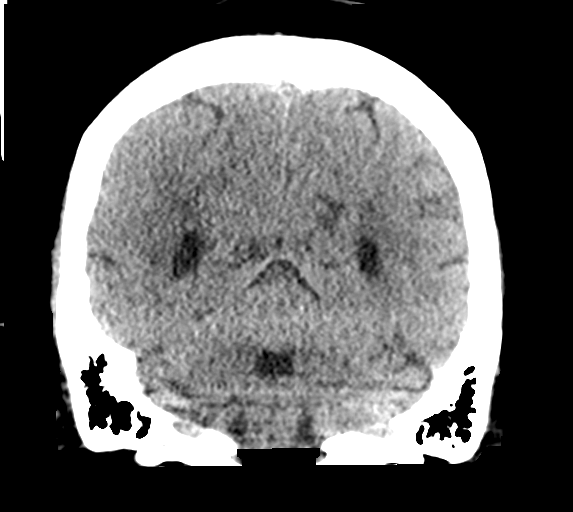
[im 28/62  brain]
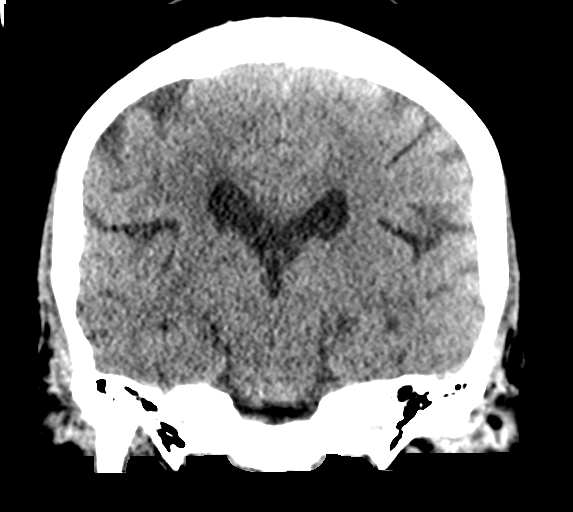
[im 34/62  brain]
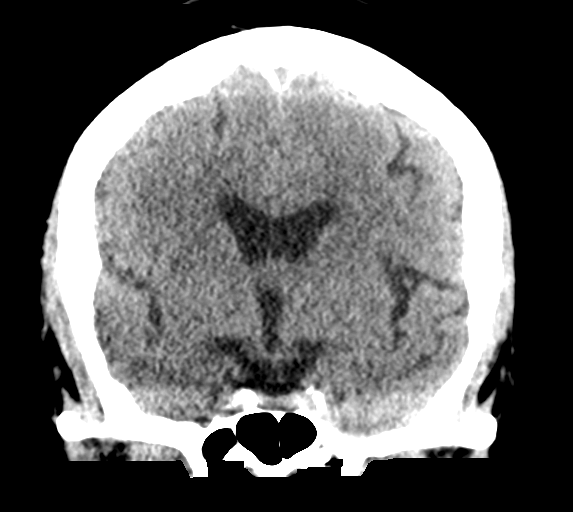

[Series 5: sagittal soft tissue · sagittal · 0.30mm/px · 3 of 57 slices shown]
[im 19/57  brain]
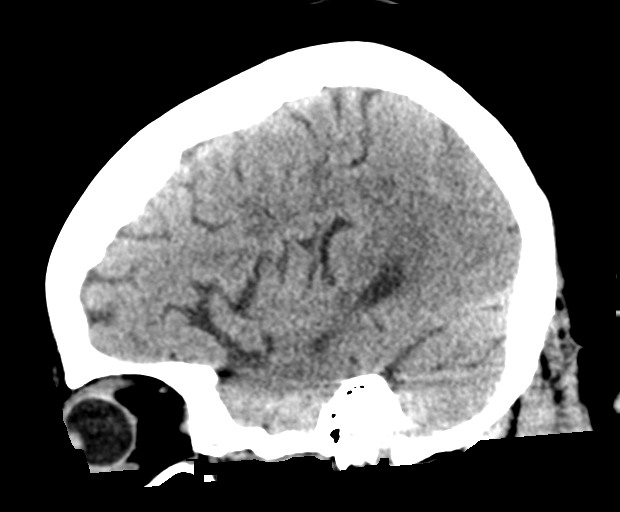
[im 29/57  brain]
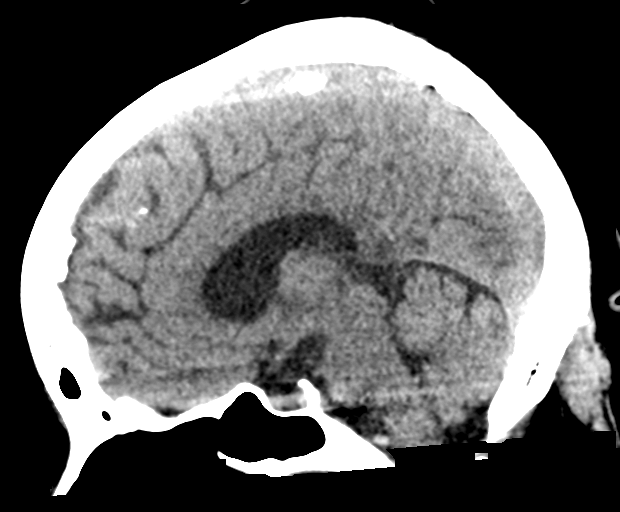
[im 38/57  brain]
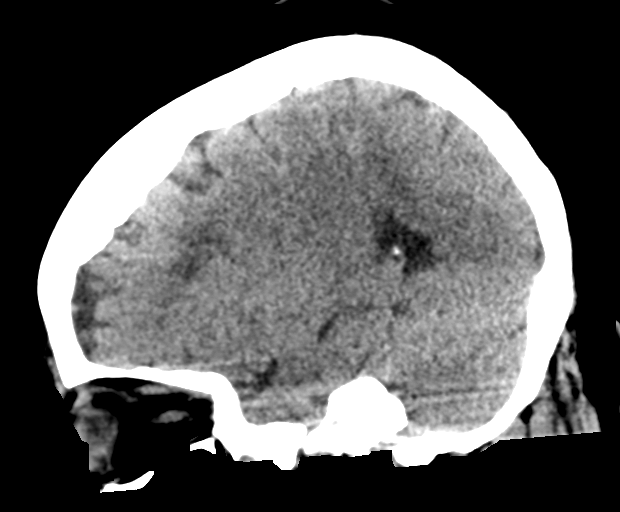

[15 of 47 positions shown; findings below may reference images not displayed]

FINDINGS: CT HEAD FINDINGS

Brain: Mild atrophy. Mild to moderate patchy white matter
hypodensity bilaterally is chronic and unchanged.

Negative for acute infarct, hemorrhage, mass

Vascular: Negative for hyperdense vessel

Skull: Negative

Sinuses/Orbits: Paranasal sinuses clear. No orbital lesion. Right
cataract extraction

Other: None

CT CERVICAL SPINE FINDINGS

Alignment: Normal

Skull base and vertebrae: Negative for fracture

Soft tissues and spinal canal: Negative

Disc levels: Advanced disc degeneration and spurring. Prominent
ossification posterior longitudinal ligament extending from C4
through C7.

Moderate spinal stenosis and moderate foraminal stenosis bilaterally
at C3-4. Severe spinal stenosis at C4-5 with moderate to severe
foraminal encroachment bilaterally. Large right-sided osteophyte
C5-6 with severe right foraminal encroachment and moderate to severe
spinal stenosis. Moderate spinal stenosis at C6-7 with severe left
foraminal stenosis.

Upper chest: Lung apices clear bilaterally.

Other: None
IMPRESSION: 1. Atrophy and chronic microvascular ischemic change in the white
matter. No acute abnormality
2. Negative for cervical spine fracture
3. Advanced cervical spondylosis and O PLL. Severe multilevel spinal
and foraminal stenosis as above.

## 2020-10-28 IMAGING — CT CT CERVICAL SPINE W/O CM
3 of 4 series · 11 of 33 positions shown, 13 images · non-contrast
Comparison: CT head [DATE]

CLINICAL DATA: Multiple falls.  Recent COVID infection

EXAM:
CT HEAD WITHOUT CONTRAST
CT CERVICAL SPINE WITHOUT CONTRAST
TECHNIQUE: Multidetector CT imaging of the head and cervical spine was
performed following the standard protocol without intravenous
contrast. Multiplanar CT image reconstructions of the cervical spine
were also generated.

[Series 6: sagittal bone · sagittal · 0.20mm/px · 5 of 68 slices shown, 6 images]
[im 23/68  bone]
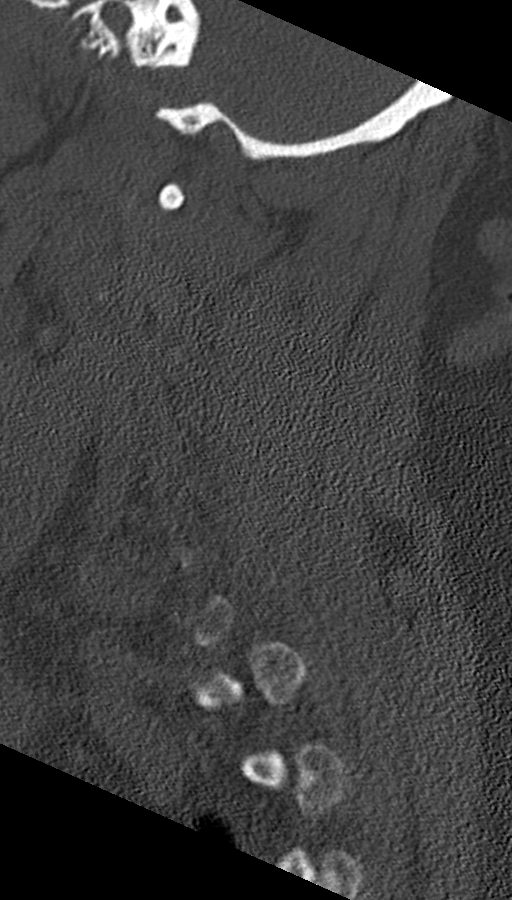
[im 28/68  bone]
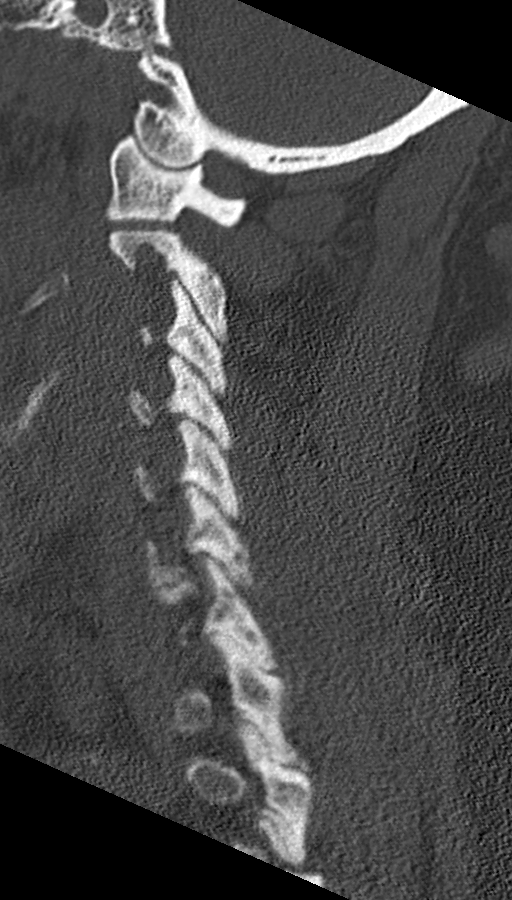
[im 34/68  soft-tissue]
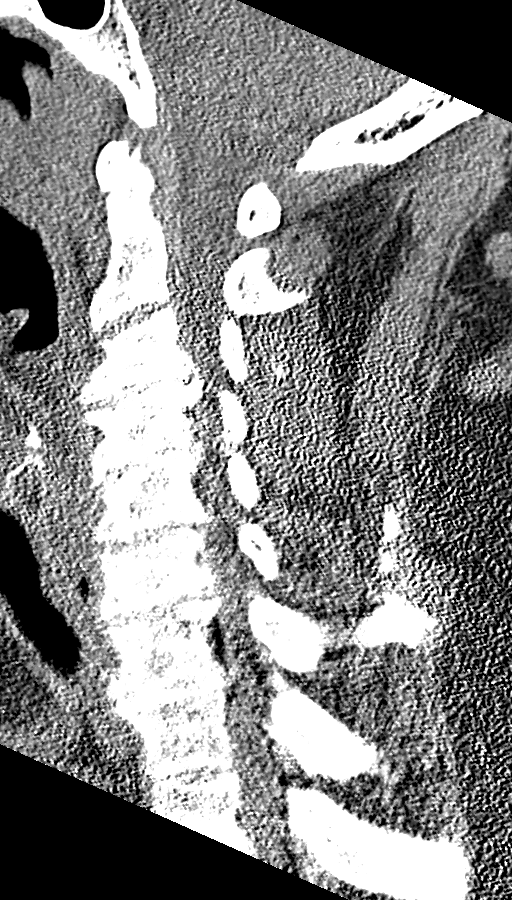
[im 34/68  bone]
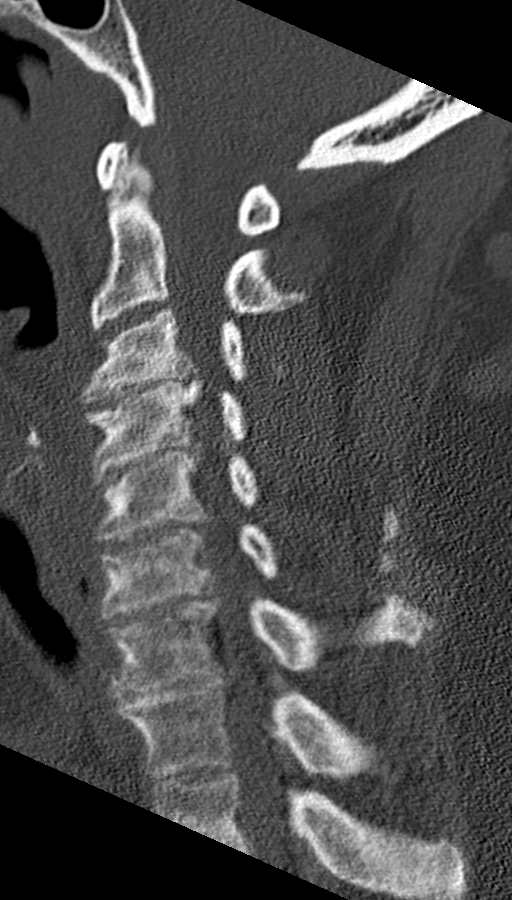
[im 40/68  bone]
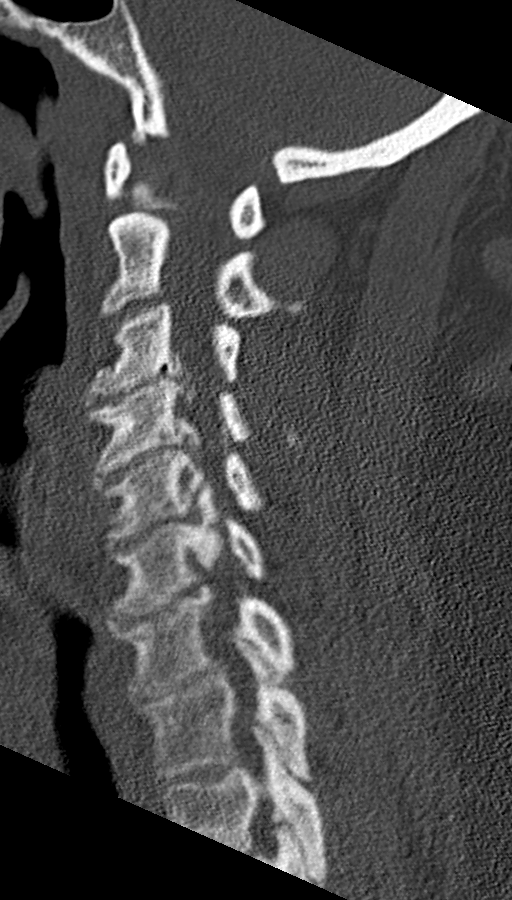
[im 45/68  bone]
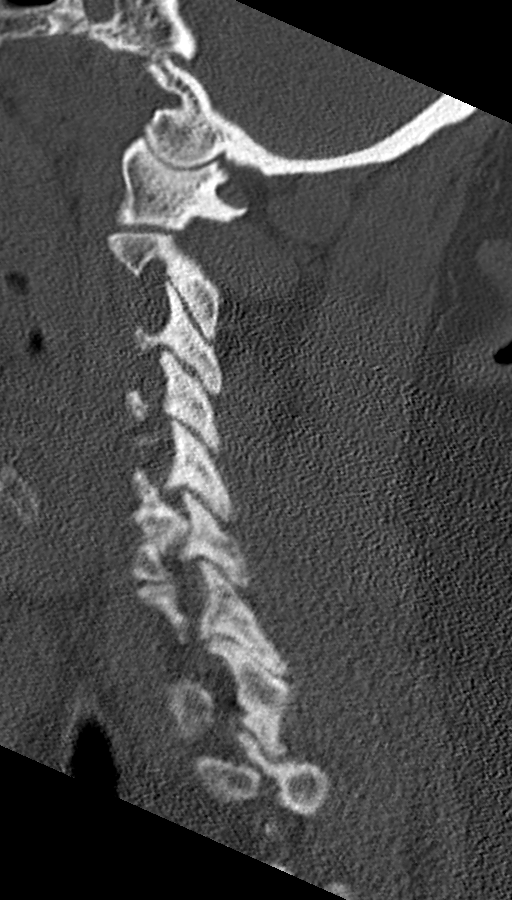

[Series 7: coronal bone · coronal · 0.26mm/px · 3 of 52 slices shown]
[im 11/52  bone]
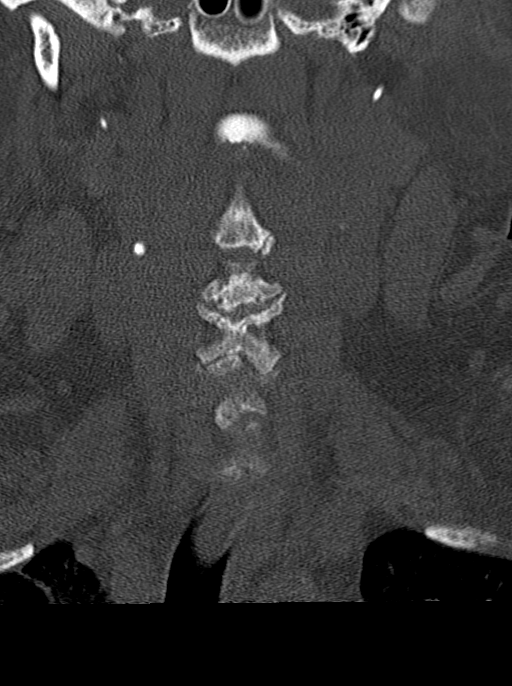
[im 21/52  bone]
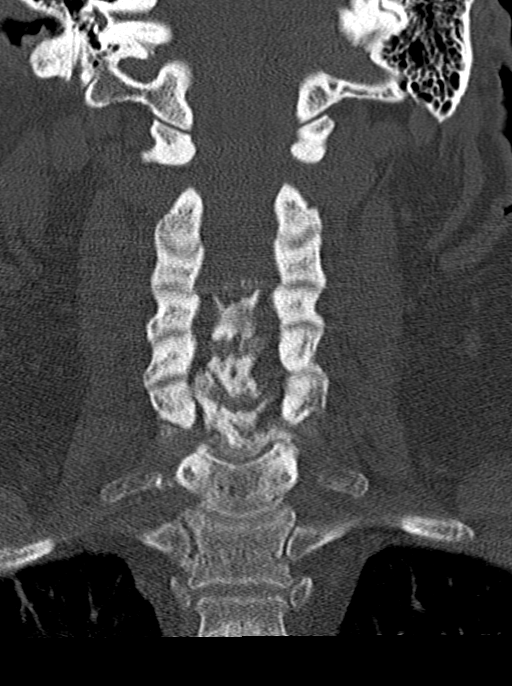
[im 31/52  bone]
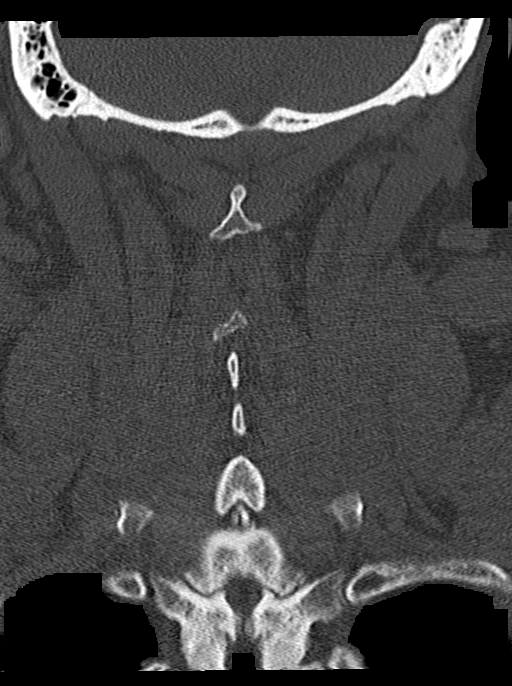

[Series 8: orthogonal bone · axial · 0.20mm/px · z∈[-289,-194]mm · 3 of 91 slices shown, 4 images]
[im 26/91  soft-tissue]
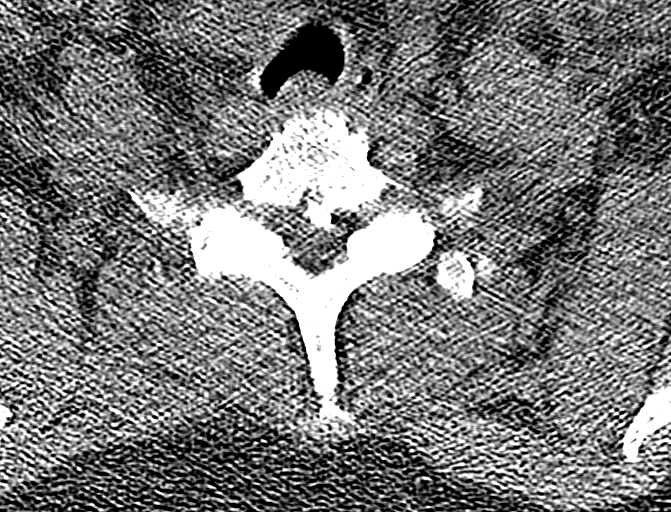
[im 26/91  bone]
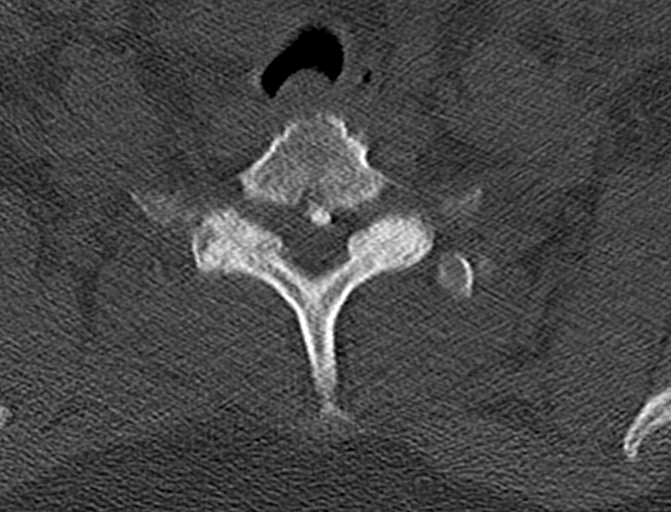
[im 52/91  bone]
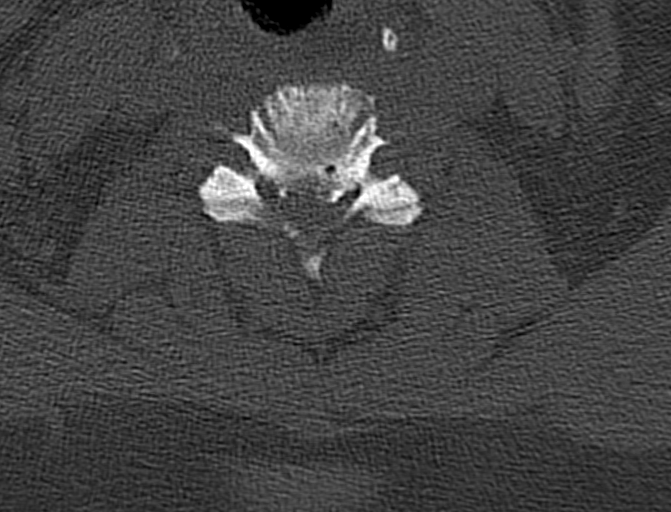
[im 78/91  bone]
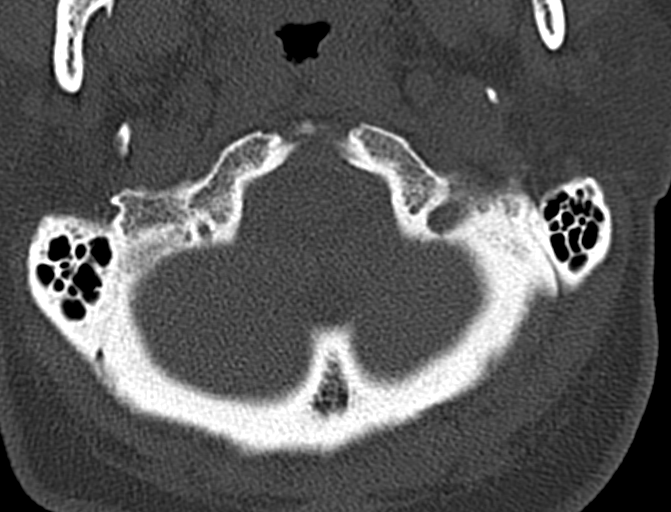

[11 of 33 positions shown; findings below may reference images not displayed]

FINDINGS: CT HEAD FINDINGS

Brain: Mild atrophy. Mild to moderate patchy white matter
hypodensity bilaterally is chronic and unchanged.

Negative for acute infarct, hemorrhage, mass

Vascular: Negative for hyperdense vessel

Skull: Negative

Sinuses/Orbits: Paranasal sinuses clear. No orbital lesion. Right
cataract extraction

Other: None

CT CERVICAL SPINE FINDINGS

Alignment: Normal

Skull base and vertebrae: Negative for fracture

Soft tissues and spinal canal: Negative

Disc levels: Advanced disc degeneration and spurring. Prominent
ossification posterior longitudinal ligament extending from C4
through C7.

Moderate spinal stenosis and moderate foraminal stenosis bilaterally
at C3-4. Severe spinal stenosis at C4-5 with moderate to severe
foraminal encroachment bilaterally. Large right-sided osteophyte
C5-6 with severe right foraminal encroachment and moderate to severe
spinal stenosis. Moderate spinal stenosis at C6-7 with severe left
foraminal stenosis.

Upper chest: Lung apices clear bilaterally.

Other: None
IMPRESSION: 1. Atrophy and chronic microvascular ischemic change in the white
matter. No acute abnormality
2. Negative for cervical spine fracture
3. Advanced cervical spondylosis and O PLL. Severe multilevel spinal
and foraminal stenosis as above.

## 2020-10-28 MED ORDER — LIDOCAINE 5 % EX PTCH
1.0000 | MEDICATED_PATCH | CUTANEOUS | Status: DC
  Administered 2020-10-28 – 2020-11-03 (×7): 1 via TRANSDERMAL
  Filled 2020-10-28 (×10): qty 1

## 2020-10-28 MED ORDER — ACETAMINOPHEN 500 MG PO TABS
1000.0000 mg | ORAL_TABLET | Freq: Once | ORAL | Status: AC
  Administered 2020-10-28: 1000 mg via ORAL
  Filled 2020-10-28: qty 2

## 2020-10-28 MED ORDER — HYDRALAZINE HCL 20 MG/ML IJ SOLN: 10.0000 mg | Freq: Four times a day (QID) | INTRAMUSCULAR | Status: DC | PRN

## 2020-10-28 MED ORDER — SODIUM CHLORIDE 0.9 % IV SOLN: INTRAVENOUS | Status: AC

## 2020-10-28 MED ORDER — VITAMIN B-12 1000 MCG PO TABS
500.0000 ug | ORAL_TABLET | Freq: Every day | ORAL | Status: DC
  Administered 2020-10-28 – 2020-11-05 (×9): 500 ug via ORAL
  Filled 2020-10-28 (×4): qty 1
  Filled 2020-10-28: qty 0.5
  Filled 2020-10-28 (×3): qty 1
  Filled 2020-10-28 (×2): qty 0.5

## 2020-10-28 MED ORDER — LORAZEPAM 2 MG/ML IJ SOLN: 1.0000 mg | INTRAMUSCULAR | Status: DC | PRN

## 2020-10-28 MED ORDER — ALPRAZOLAM 0.5 MG PO TABS
0.5000 mg | ORAL_TABLET | Freq: Once | ORAL | Status: AC
  Administered 2020-10-28: 0.5 mg via ORAL
  Filled 2020-10-28: qty 1

## 2020-10-28 MED ORDER — FLUOXETINE HCL 20 MG PO CAPS
20.0000 mg | ORAL_CAPSULE | Freq: Every day | ORAL | Status: DC
  Administered 2020-10-28 – 2020-11-05 (×9): 20 mg via ORAL
  Filled 2020-10-28 (×10): qty 1

## 2020-10-28 MED ORDER — ASPIRIN 325 MG PO TABS
325.0000 mg | ORAL_TABLET | Freq: Every day | ORAL | Status: DC
Start: 2020-10-28 — End: 2020-11-05
  Administered 2020-10-28 – 2020-11-05 (×9): 325 mg via ORAL
  Filled 2020-10-28 (×9): qty 1

## 2020-10-28 MED ORDER — ACETAMINOPHEN 325 MG PO TABS
650.0000 mg | ORAL_TABLET | Freq: Four times a day (QID) | ORAL | Status: DC | PRN
  Administered 2020-10-28 – 2020-11-04 (×11): 650 mg via ORAL
  Filled 2020-10-28 (×12): qty 2

## 2020-10-28 MED ORDER — GUAIFENESIN ER 600 MG PO TB12
600.0000 mg | ORAL_TABLET | Freq: Two times a day (BID) | ORAL | Status: DC
  Administered 2020-10-28 – 2020-11-05 (×16): 600 mg via ORAL
  Filled 2020-10-28 (×17): qty 1

## 2020-10-28 MED ORDER — GABAPENTIN 100 MG PO CAPS
200.0000 mg | ORAL_CAPSULE | Freq: Every day | ORAL | Status: DC
  Administered 2020-10-28 – 2020-10-30 (×3): 200 mg via ORAL
  Filled 2020-10-28 (×3): qty 2

## 2020-10-28 MED ORDER — SENNOSIDES-DOCUSATE SODIUM 8.6-50 MG PO TABS: 1.0000 | ORAL_TABLET | Freq: Every evening | ORAL | Status: DC | PRN

## 2020-10-28 MED ORDER — TRIAMTERENE-HCTZ 37.5-25 MG PO TABS
1.0000 | ORAL_TABLET | Freq: Every day | ORAL | Status: DC
  Administered 2020-10-28 – 2020-11-05 (×9): 1 via ORAL
  Filled 2020-10-28 (×9): qty 1

## 2020-10-28 MED ORDER — ADULT MULTIVITAMIN W/MINERALS CH
1.0000 | ORAL_TABLET | Freq: Every day | ORAL | Status: DC
  Administered 2020-10-28 – 2020-11-05 (×9): 1 via ORAL
  Filled 2020-10-28 (×9): qty 1

## 2020-10-28 MED ORDER — ATORVASTATIN CALCIUM 20 MG PO TABS
40.0000 mg | ORAL_TABLET | Freq: Every day | ORAL | Status: DC
  Administered 2020-10-28 – 2020-11-05 (×9): 40 mg via ORAL
  Filled 2020-10-28 (×9): qty 2

## 2020-10-28 MED ORDER — ALPRAZOLAM 0.5 MG PO TABS
0.5000 mg | ORAL_TABLET | Freq: Three times a day (TID) | ORAL | Status: DC | PRN
  Administered 2020-10-29 – 2020-11-05 (×20): 0.5 mg via ORAL
  Filled 2020-10-28 (×21): qty 1

## 2020-10-28 MED ORDER — ACETAMINOPHEN 650 MG RE SUPP: 650.0000 mg | Freq: Four times a day (QID) | RECTAL | Status: DC | PRN

## 2020-10-28 MED ORDER — ENOXAPARIN SODIUM 40 MG/0.4ML ~~LOC~~ SOLN
40.0000 mg | SUBCUTANEOUS | Status: DC
  Administered 2020-10-28 – 2020-11-04 (×8): 40 mg via SUBCUTANEOUS
  Filled 2020-10-28 (×9): qty 0.4

## 2020-10-28 NOTE — ED Triage Notes (Signed)
Patient arrived by EMS from [facility]. Patient stated to this RN she is here because "no one would come when I kept calling them. No one wanted to help me do anything". EMS reported to first nurse possible UTI. Patient is A&O x4.

## 2020-10-28 NOTE — ED Notes (Signed)
Pt has fallen x 5 in past 2 days.

## 2020-10-28 NOTE — ED Notes (Signed)
ekg completed and given to provider for review

## 2020-10-28 NOTE — Telephone Encounter (Signed)
I left a detail message on [facility] vm given the okay for evaluation and treatment.

## 2020-10-28 NOTE — ED Notes (Signed)
After patient was put in recliner in lobby, this RN brought patient to triage room 2 to triage her. She told this RN she needed to pee and was not going to wait. Patient proceeded to pee in recliner all over herself. Patients shirt and jacket placed in belongings bag. Patient has a colorful zip bag with all her medications in it and a red cover cell phone in her lap. Patient spilt more than half of her fluoxetine in floor in lobby

## 2020-10-28 NOTE — H&P (Signed)
Chief Complaint: Patient was referred from ALF on account of multiple falls at the facility. HPI: Chelsea [name] is an 75 y.o. 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  Patient was discharged 4 days ago back to her assisted living facility.  She returned to the emergency room today after facility called EMS on account of frequent falls and altered mental status from baseline.  At time of evaluation, patient was lucid and admitted to having slipped from her chair onto the floor but has been unable to get up by herself due to generalized weakness.  She denied hitting her head.  ALF reported 5 episodes of falls within the past 4 days.  She was recently prescribed with lidocaine but as per family medication was yet to be filled.  She denies any chest pain or palpitation.  Denies any increased focal weakness or drowsiness.  MRI on recent admission was negative for any acute CVA.  Denies fever or chills.  Denies any increased frequency of micturition or nocturia.  As per family, patient was previously referred to a neurologist and an orthopedic surgeon however was unable to follow-up. On presentation, work-up including labs were unremarkable.  She is however globally weak and felt not to be a candidate for assisted living facility.  Patient will be admitted for reassessment by physical therapy and OT and to plan on placement.  Past Medical History:  Diagnosis Date  . Anxiety   . Arthritis   . Depression   . Hyperlipidemia   . Hypertension   . Seizures ([place])   . Spinal stenosis   . Ulcer   . Urinary incontinence     Past Surgical History:  Procedure Laterality Date  . ABDOMINAL HYSTERECTOMY    . BACK SURGERY     due to polio  . BREAST BIOPSY Bilateral    neg  . BREAST BIOPSY Right 2011   neg/stereo  . CARPAL TUNNEL RELEASE    .  CATARACT EXTRACTION Right 2020    Family History  Problem Relation Age of Onset  . Arthritis Mother   . Heart disease Mother   . Stroke Mother   . Hypertension Mother   . COPD Mother   . Sudden death Sister   . Other Father        unknown medical history   Social History:  reports that she has never smoked. She has never used smokeless tobacco. She reports that she does not drink alcohol and does not use drugs.  Allergies:  Allergies  Allergen Reactions  . Penicillins     "Everything turned black"    (Not in a hospital admission)   Results for orders placed or performed during the hospital encounter of 10/28/20 (from the past 48 hour(s))  CBC with Differential     Status: Abnormal   Collection Time: 10/28/20  2:09 PM  Result Value Ref Range   WBC 4.6 4.0 - 10.5 K/uL   RBC 3.78 (L) 3.87 - 5.11 MIL/uL   Hemoglobin 11.4 (L) 12.0 - 15.0 g/dL   HCT 36.3 36.0 - 46.0 %   MCV 96.0 80.0 - 100.0 fL   MCH 30.2 26.0 - 34.0 pg   MCHC 31.4 30.0 - 36.0 g/dL   RDW 14.6 11.5 - 15.5 %   Platelets 217 150 - 400 K/uL   nRBC 0.0 0.0 - 0.2 %   Neutrophils Relative %  45 %   Neutro Abs 2.0 1.7 - 7.7 K/uL   Lymphocytes Relative 40 %   Lymphs Abs 1.8 0.7 - 4.0 K/uL   Monocytes Relative 12 %   Monocytes Absolute 0.5 0.1 - 1.0 K/uL   Eosinophils Relative 3 %   Eosinophils Absolute 0.1 0.0 - 0.5 K/uL   Basophils Relative 0 %   Basophils Absolute 0.0 0.0 - 0.1 K/uL   Immature Granulocytes 0 %   Abs Immature Granulocytes 0.02 0.00 - 0.07 K/uL    Comment: Performed at [facility], [address] [place], [place] [address]  Comprehensive metabolic panel     Status: Abnormal   Collection Time: 10/28/20  2:09 PM  Result Value Ref Range   Sodium 142 135 - 145 mmol/L   Potassium 4.0 3.5 - 5.1 mmol/L   Chloride 99 98 - 111 mmol/L   CO2 32 22 - 32 mmol/L   Glucose, Bld 86 70 - 99 mg/dL    Comment: Glucose reference range applies only to samples taken after fasting for at least 8  hours.   BUN 14 8 - 23 mg/dL   Creatinine, Ser 0.81 0.44 - 1.00 mg/dL   Calcium 9.3 8.9 - 10.3 mg/dL   Total Protein 6.7 6.5 - 8.1 g/dL   Albumin 3.3 (L) 3.5 - 5.0 g/dL   AST 27 15 - 41 U/L   ALT 29 0 - 44 U/L   Alkaline Phosphatase 70 38 - 126 U/L   Total Bilirubin 0.8 0.3 - 1.2 mg/dL   GFR, Estimated >60 >60 mL/min    Comment: (NOTE) Calculated using the CKD-EPI Creatinine Equation (2021)    Anion gap 11 5 - 15    Comment: Performed at [facility], [address] [place], [place] [address]  Urinalysis, Complete w Microscopic Urine, Clean Catch     Status: Abnormal   Collection Time: 10/28/20  4:02 PM  Result Value Ref Range   Color, Urine YELLOW (A) YELLOW   APPearance CLEAR (A) CLEAR   Specific Gravity, Urine 1.014 1.005 - 1.030   pH 6.0 5.0 - 8.0   Glucose, UA NEGATIVE NEGATIVE mg/dL   Hgb urine dipstick NEGATIVE NEGATIVE   Bilirubin Urine NEGATIVE NEGATIVE   Ketones, ur NEGATIVE NEGATIVE mg/dL   Protein, ur NEGATIVE NEGATIVE mg/dL   Nitrite NEGATIVE NEGATIVE   Leukocytes,Ua NEGATIVE NEGATIVE   RBC / HPF 0-5 0 - 5 RBC/hpf   WBC, UA 0-5 0 - 5 WBC/hpf   Bacteria, UA NONE SEEN NONE SEEN   Squamous Epithelial / LPF 0-5 0 - 5   Mucus PRESENT     Comment: Performed at [facility], [address] [place], [place] [address]  CK     Status: None   Collection Time: 10/28/20  4:21 PM  Result Value Ref Range   Total CK 160 38 - 234 U/L    Comment: Performed at [facility], [address] [place], [place] [address]   CT Head Wo Contrast  Result Date: 10/28/2020 CLINICAL DATA:  Multiple falls.  Recent COVID infection EXAM: CT HEAD WITHOUT CONTRAST CT CERVICAL SPINE WITHOUT CONTRAST TECHNIQUE: Multidetector CT imaging of the head and cervical spine was performed following the standard protocol without intravenous contrast. Multiplanar CT image reconstructions of the cervical spine were also generated. COMPARISON:  CT head 07/08/2019 FINDINGS: CT  HEAD FINDINGS Brain: Mild atrophy. Mild to moderate patchy white matter hypodensity bilaterally is chronic and unchanged. Negative for acute infarct,  hemorrhage, mass Vascular: Negative for hyperdense vessel Skull: Negative Sinuses/Orbits: Paranasal sinuses clear. No orbital lesion. Right cataract extraction Other: None CT CERVICAL SPINE FINDINGS Alignment: Normal Skull base and vertebrae: Negative for fracture Soft tissues and spinal canal: Negative Disc levels: Advanced disc degeneration and spurring. 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 Moderate spinal stenosis and moderate foraminal stenosis bilaterally at C3-4. Severe spinal stenosis at C4-5 with moderate to severe foraminal encroachment bilaterally. Large right-sided osteophyte C5-6 with severe right foraminal encroachment and moderate to severe spinal stenosis. Moderate spinal stenosis at C6-7 with severe left foraminal stenosis. Upper chest: Lung apices clear bilaterally. Other: None IMPRESSION: 1. Atrophy and chronic microvascular ischemic change in the white matter. No acute abnormality 2. Negative for cervical spine fracture 3. Advanced cervical spondylosis and O PLL. Severe multilevel spinal and foraminal stenosis as above. Electronically Signed   By: Franchot Gallo M.D.   On: 10/28/2020 16:55   CT Cervical Spine Wo Contrast  Result Date: 10/28/2020 CLINICAL DATA:  Multiple falls.  Recent COVID infection EXAM: CT HEAD WITHOUT CONTRAST CT CERVICAL SPINE WITHOUT CONTRAST TECHNIQUE: Multidetector CT imaging of the head and cervical spine was performed following the standard protocol without intravenous contrast. Multiplanar CT image reconstructions of the cervical spine were also generated. COMPARISON:  CT head 07/08/2019 FINDINGS: CT HEAD FINDINGS Brain: Mild atrophy. Mild to moderate patchy white matter hypodensity bilaterally is chronic and unchanged. Negative for acute infarct, hemorrhage, mass  Vascular: Negative for hyperdense vessel Skull: Negative Sinuses/Orbits: Paranasal sinuses clear. No orbital lesion. Right cataract extraction Other: None CT CERVICAL SPINE FINDINGS Alignment: Normal Skull base and vertebrae: Negative for fracture Soft tissues and spinal canal: Negative Disc levels: Advanced disc degeneration and spurring. 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 Moderate spinal stenosis and moderate foraminal stenosis bilaterally at C3-4. Severe spinal stenosis at C4-5 with moderate to severe foraminal encroachment bilaterally. Large right-sided osteophyte C5-6 with severe right foraminal encroachment and moderate to severe spinal stenosis. Moderate spinal stenosis at C6-7 with severe left foraminal stenosis. Upper chest: Lung apices clear bilaterally. Other: None IMPRESSION: 1. Atrophy and chronic microvascular ischemic change in the white matter. No acute abnormality 2. Negative for cervical spine fracture 3. Advanced cervical spondylosis and O PLL. Severe multilevel spinal and foraminal stenosis as above. Electronically Signed   By: Franchot Gallo M.D.   On: 10/28/2020 16:55    Review of Systems  Constitutional: Positive for activity change and appetite change. Negative for chills, diaphoresis, fatigue and fever.  HENT: Negative.   Respiratory: Negative.   Cardiovascular: Negative.   Gastrointestinal: Negative.   Genitourinary: Negative.   Musculoskeletal: Positive for gait problem and neck pain.  Skin: Negative.   Hematological: Negative.   Psychiatric/Behavioral: Negative.     Blood pressure 130/71, pulse 75, temperature 98 F (36.7 C), resp. rate 14, height 4\' 9"  (1.448 m), weight 80.7 kg, SpO2 97 %. Physical Exam Constitutional:      Appearance: She is obese.  HENT:     Head: Normocephalic and atraumatic.  Neck:     Comments: Limited exam Cardiovascular:     Rate and Rhythm: Normal rate and regular rhythm.  Pulmonary:      Effort: Pulmonary effort is normal.  Abdominal:     General: Abdomen is flat.     Palpations: Abdomen is soft.  Musculoskeletal:     Cervical back: Neck supple.  Skin:    General: Skin is warm and dry.  Neurological:  Mental Status: She is alert.     Motor: Weakness present.     Comments: Chronic right sided weakness and sensory deficits  Psychiatric:        Mood and Affect: Mood normal.        Behavior: Behavior normal.      Assessment/Plan  Acute debility/generalized weakness most likely due to recent COVID-19/recent falls with rhabdomyolysis.  Repeat CPK levels were within normal limits.  Patient will be volume repleted with IV fluids.  Patient will benefit from physical therapy outpatient therapy evaluation and possible placement in acute rehab facility.  MRI brain from previous admission did not show any evidence of acute CVA.  No indication for another repeat.    Multilevel severe Cervical spine and foraminal stenosis: More prominent at C4-C5.  Likely may be contributing to right upper extremity weakness.  Patient has been offered surgical options in the past.  She had a neurology follow-up scheduled but due to readmission, has been unable to follow-up.  Neuro and Ortho input may be warranted during the course of the stay.  MRI of the cervical spine was ordered by ED physician.  Results pending.  Hypertension: Blood pressure stable.  Will resume home blood pressure medications.  Mild dehydration: Patient with volume repleted with IV fluids as appropriate.  Recent diagnosis of COVID-19: Patient apparently completed isolation protocol a couple of days ago.  She remains asymptomatic.  Recent history of rhabdomyolysis secondary to fall: Resolved.  CPK level within normal limits.  History of anxiety and depression: Patient is on Xanax and Prozac.   [place], MD 10/28/2020, 6:00 PM

## 2020-10-28 NOTE — ED Provider Notes (Signed)
[facility] Emergency Department Provider Note   ____________________________________________   Event Date/Time   First MD Initiated Contact with Patient 10/28/20 1528     (approximate)  I have reviewed the triage vital signs and the nursing notes.   HISTORY  Chief Complaint Urinary Frequency    HPI Chelsea [name] is a 75 y.o. female with past medical history of hypertension, hyperlipidemia, urinary incontinence, and anxiety who presents to the ED for weakness.  Patient's daughter reports that patient was discharged from the hospital 4 days ago and has been very weak since then.  She has had a total of 5 falls over the past 4 days, is unsure whether she has hit her head.  She does deal with chronic right-sided numbness and weakness which seems to be gradually worsening over the past few days and months.  She currently resides at assisted living and staff there was concerned that she has been more disoriented as well.  Staff there was concerned that the patient could have a UTI and so EMS was called to bring her to the ED.  Patient denies any fevers, abdominal pain, nausea, vomiting, dysuria, or hematuria.  Daughter at the bedside feels like patient needs a higher level of care than she is currently getting at assisted living at [facility].        Past Medical History:  Diagnosis Date  . Anxiety   . Arthritis   . Depression   . Hyperlipidemia   . Hypertension   . Seizures ([place])   . Spinal stenosis   . Ulcer   . Urinary incontinence     Patient Active Problem List   Diagnosis Date Noted  . Rhabdomyolysis 10/22/2020  . Right arm weakness 07/09/2020  . Right leg weakness 07/09/2020  . Urinary tract infection without hematuria 07/09/2020  . Abnormal urinalysis 07/09/2020  . Memory difficulties 07/09/2020  . Chronic anemia 09/26/2018  . History of gastritis 05/03/2018  . History of Helicobacter pylori infection 05/03/2018  . Hx of adenomatous  colonic polyps 05/03/2018  . BMI 40.0-44.9, adult ([place]) 03/15/2018  . Postmenopausal 03/15/2018  . Depression, major, recurrent, in partial remission ([place]) 01/11/2018  . Anxiety state 01/11/2018  . History of scoliosis 01/11/2018  . Pure hypercholesterolemia 01/11/2018  . Arthritis of big toe 10/19/2012  . Sinusitis 08/26/2012  . Physical exam, annual 05/16/2012  . Neck pain 04/11/2012  . Allergic conjunctivitis 06/23/2011  . Oral ulcer 06/23/2011  . GAD (generalized anxiety disorder) 05/14/2011  . Essential hypertension 02/06/2011  . Hyperlipidemia 02/06/2011  . Spinal stenosis of lumbosacral region 02/06/2011  . S/P breast biopsy 02/06/2011    Past Surgical History:  Procedure Laterality Date  . ABDOMINAL HYSTERECTOMY    . BACK SURGERY     due to polio  . BREAST BIOPSY Bilateral    neg  . BREAST BIOPSY Right 2011   neg/stereo  . CARPAL TUNNEL RELEASE    . CATARACT EXTRACTION Right 2020    Prior to Admission medications   Medication Sig Start Date End Date Taking? Authorizing Provider  ALPRAZolam Duanne Moron) 0.5 MG tablet Take 1 tablet (0.5 mg total) by mouth 3 (three) times daily as needed for anxiety. 10/24/20   Max Sane, MD  aspirin 325 MG tablet Take 325 mg by mouth daily.    [provider]  atorvastatin (LIPITOR) 40 MG tablet Take 1 tablet (40 mg total) by mouth daily. 02/28/20   Malfi, Lupita Raider, FNP  Cyanocobalamin (VITAMIN B 12  PO) Take 500 mcg by mouth daily.    [provider]  FLUoxetine (PROZAC) 20 MG capsule TAKE 3 CAPSULES BY MOUTH EVERY DAY Patient taking differently: Take 60 mg by mouth daily. 10/01/20   Malfi, Lupita Raider, FNP  gabapentin (NEURONTIN) 100 MG capsule Take 200 mg by mouth daily. 10/06/20   [provider]  lidocaine (LIDODERM) 5 % Place 1 patch onto the skin daily. Remove & Discard patch within 12 hours or as directed by MD 10/24/20   Max Sane, MD  triamterene-hydrochlorothiazide (MAXZIDE-25) 37.5-25 MG tablet TAKE 1  TABLET BY MOUTH EVERY DAY 10/07/20   Malfi, Lupita Raider, FNP  simvastatin (ZOCOR) 40 MG tablet TAKE 1 TABLET BY MOUTH EVERY DAY 10/30/19 02/28/20  Olin Hauser, DO    Allergies Penicillins  Family History  Problem Relation Age of Onset  . Arthritis Mother   . Heart disease Mother   . Stroke Mother   . Hypertension Mother   . COPD Mother   . Sudden death Sister   . Other Father        unknown medical history    Social History Social History   Tobacco Use  . Smoking status: Never Smoker  . Smokeless tobacco: Never Used  Vaping Use  . Vaping Use: Never used  Substance Use Topics  . Alcohol use: Never  . Drug use: No    Review of Systems  Constitutional: No fever/chills.  Positive for generalized weakness. Eyes: No visual changes. ENT: No sore throat. Cardiovascular: Denies chest pain. Respiratory: Denies shortness of breath. Gastrointestinal: No abdominal pain.  No nausea, no vomiting.  No diarrhea.  No constipation. Genitourinary: Negative for dysuria. Musculoskeletal: Negative for back pain. Skin: Negative for rash. Neurological: Negative for headaches, positive for right sided numbness and weakness.  ____________________________________________   PHYSICAL EXAM:  VITAL SIGNS: ED Triage Vitals  Enc Vitals Group     BP 10/28/20 1404 130/71     Pulse Rate 10/28/20 1404 75     Resp 10/28/20 1404 14     Temp 10/28/20 1404 98 F (36.7 C)     Temp src --      SpO2 10/28/20 1404 97 %     Weight 10/28/20 1405 178 lb (80.7 kg)     Height 10/28/20 1405 4\' 9"  (1.448 m)     Head Circumference --      Peak Flow --      Pain Score 10/28/20 1405 6     Pain Loc --      Pain Edu? --      Excl. in [place]? --     Constitutional: Alert and oriented. Eyes: Conjunctivae are normal. Head: Atraumatic. Nose: No congestion/rhinnorhea. Mouth/Throat: Mucous membranes are moist. Neck: Normal ROM Cardiovascular: Normal rate, regular rhythm. Grossly normal heart  sounds. Respiratory: Normal respiratory effort.  No retractions. Lungs CTAB. Gastrointestinal: Soft and nontender. No distention. Genitourinary: deferred Musculoskeletal: No lower extremity tenderness nor edema. Neurologic:  Normal speech and language.  4+ out of 5 strength on right upper extremity and right lower extremity, 5 out of 5 strength on left upper and lower extremities. Skin:  Skin is warm, dry and intact. No rash noted. Psychiatric: Mood and affect are normal. Speech and behavior are normal.  ____________________________________________   LABS (all labs ordered are listed, but only abnormal results are displayed)  Labs Reviewed  CBC WITH DIFFERENTIAL/PLATELET - Abnormal; Notable for the following components:      Result Value   RBC  3.78 (*)    Hemoglobin 11.4 (*)    All other components within normal limits  COMPREHENSIVE METABOLIC PANEL - Abnormal; Notable for the following components:   Albumin 3.3 (*)    All other components within normal limits  URINALYSIS, COMPLETE (UACMP) WITH MICROSCOPIC - Abnormal; Notable for the following components:   Color, Urine YELLOW (*)    APPearance CLEAR (*)    All other components within normal limits  CK   ____________________________________________  EKG  ED ECG REPORT I, Blake Divine, the attending physician, personally viewed and interpreted this ECG.   Date: 10/28/2020  EKG Time: 16:35  Rate: 71  Rhythm: normal sinus rhythm, PVC  Axis: Normal  Intervals:none  ST&T Change: None   PROCEDURES  Procedure(s) performed (including Critical Care):  Procedures   ____________________________________________   INITIAL IMPRESSION / ASSESSMENT AND PLAN / ED COURSE       75 year old female with past medical history of hypertension, hyperlipidemia, seizures, and urinary incontinence who presents to the ED complaining of gradually worsening weakness for the past few months, affecting the right side of her body greater  than the left.  She has had multiple falls within just the past few days, is unsure whether she hit her head.  We will further assess with CT head and C-spine, labs thus far reassuring but she did recently require admission for rhabdomyolysis and we will add on CK.  UA pending for possible UTI.  Patient had MRI brain during admission last week that showed no acute stroke but did show cervical spinal stenosis that was incompletely assessed.  If CT imaging is unremarkable, patient would likely benefit from MRI of her cervical spine to further assess this as a potential etiology of her right-sided weakness.  CT head is negative for acute process, cervical spine shows no acute traumatic injury but does redemonstrate significant cervical stenosis.  We will further assess with MRI and given patient's multiple falls, case discussed with hospitalist for admission.      ____________________________________________   FINAL CLINICAL IMPRESSION(S) / ED DIAGNOSES  Final diagnoses:  Multiple falls  Cervical stenosis of spine     ED Discharge Orders    None       Note:  This document was prepared using Dragon voice recognition software and may include unintentional dictation errors.   Blake Divine, MD 10/28/20 [phone]

## 2020-10-28 NOTE — Telephone Encounter (Signed)
Palliative care can evaluate and treat based on their recommendations

## 2020-10-28 NOTE — ED Notes (Signed)
Pt in MRI at this time. Will be returned to rm 35.

## 2020-10-28 NOTE — ED Notes (Signed)
Provider notified pt is request a xanix

## 2020-10-29 DIAGNOSIS — M4302 Spondylolysis, cervical region: Secondary | ICD-10-CM | POA: Diagnosis not present

## 2020-10-29 DIAGNOSIS — I10 Essential (primary) hypertension: Secondary | ICD-10-CM | POA: Diagnosis present

## 2020-10-29 DIAGNOSIS — Z86010 Personal history of colonic polyps: Secondary | ICD-10-CM | POA: Diagnosis not present

## 2020-10-29 DIAGNOSIS — R54 Age-related physical debility: Secondary | ICD-10-CM | POA: Diagnosis not present

## 2020-10-29 DIAGNOSIS — Z79899 Other long term (current) drug therapy: Secondary | ICD-10-CM | POA: Diagnosis not present

## 2020-10-29 DIAGNOSIS — Z880 Allergy status to penicillin: Secondary | ICD-10-CM | POA: Diagnosis not present

## 2020-10-29 DIAGNOSIS — E860 Dehydration: Secondary | ICD-10-CM | POA: Diagnosis present

## 2020-10-29 DIAGNOSIS — E785 Hyperlipidemia, unspecified: Secondary | ICD-10-CM | POA: Diagnosis present

## 2020-10-29 DIAGNOSIS — Z8249 Family history of ischemic heart disease and other diseases of the circulatory system: Secondary | ICD-10-CM | POA: Diagnosis not present

## 2020-10-29 DIAGNOSIS — Z90710 Acquired absence of both cervix and uterus: Secondary | ICD-10-CM | POA: Diagnosis not present

## 2020-10-29 DIAGNOSIS — M549 Dorsalgia, unspecified: Secondary | ICD-10-CM | POA: Diagnosis present

## 2020-10-29 DIAGNOSIS — Z20822 Contact with and (suspected) exposure to covid-19: Secondary | ICD-10-CM | POA: Diagnosis present

## 2020-10-29 DIAGNOSIS — R296 Repeated falls: Secondary | ICD-10-CM | POA: Diagnosis present

## 2020-10-29 DIAGNOSIS — Z7982 Long term (current) use of aspirin: Secondary | ICD-10-CM | POA: Diagnosis not present

## 2020-10-29 DIAGNOSIS — R350 Frequency of micturition: Secondary | ICD-10-CM | POA: Diagnosis present

## 2020-10-29 DIAGNOSIS — W19XXXA Unspecified fall, initial encounter: Secondary | ICD-10-CM | POA: Diagnosis present

## 2020-10-29 DIAGNOSIS — M6282 Rhabdomyolysis: Secondary | ICD-10-CM | POA: Diagnosis present

## 2020-10-29 DIAGNOSIS — Z6838 Body mass index (BMI) 38.0-38.9, adult: Secondary | ICD-10-CM | POA: Diagnosis not present

## 2020-10-29 DIAGNOSIS — U071 COVID-19: Secondary | ICD-10-CM | POA: Diagnosis not present

## 2020-10-29 DIAGNOSIS — R531 Weakness: Secondary | ICD-10-CM

## 2020-10-29 DIAGNOSIS — F419 Anxiety disorder, unspecified: Secondary | ICD-10-CM | POA: Diagnosis present

## 2020-10-29 DIAGNOSIS — M4802 Spinal stenosis, cervical region: Secondary | ICD-10-CM | POA: Diagnosis present

## 2020-10-29 DIAGNOSIS — F32A Depression, unspecified: Secondary | ICD-10-CM | POA: Diagnosis present

## 2020-10-29 DIAGNOSIS — E669 Obesity, unspecified: Secondary | ICD-10-CM | POA: Diagnosis present

## 2020-10-29 DIAGNOSIS — Z8616 Personal history of COVID-19: Secondary | ICD-10-CM | POA: Diagnosis not present

## 2020-10-29 LAB — BASIC METABOLIC PANEL
Anion gap: 10 (ref 5–15)
BUN: 15 mg/dL (ref 8–23)
CO2: 31 mmol/L (ref 22–32)
Calcium: 9.3 mg/dL (ref 8.9–10.3)
Chloride: 99 mmol/L (ref 98–111)
Creatinine, Ser: 0.74 mg/dL (ref 0.44–1.00)
GFR, Estimated: >60 mL/min (ref >60)
Glucose, Bld: 89 mg/dL (ref 70–99)
Potassium: 3.6 mmol/L (ref 3.5–5.1)
Sodium: 140 mmol/L (ref 135–145)

## 2020-10-29 LAB — CBC
HCT: 35.1 % — ABNORMAL LOW (ref 36.0–46.0)
Hemoglobin: 11.2 g/dL — ABNORMAL LOW (ref 12.0–15.0)
MCH: 30 pg (ref 26.0–34.0)
MCHC: 31.9 g/dL (ref 30.0–36.0)
MCV: 94.1 fL (ref 80.0–100.0)
Platelets: 216 10*3/uL (ref 150–400)
RBC: 3.73 MIL/uL — ABNORMAL LOW (ref 3.87–5.11)
RDW: 14.5 % (ref 11.5–15.5)
WBC: 4.8 10*3/uL (ref 4.0–10.5)
nRBC: 0 % (ref 0.0–0.2)

## 2020-10-29 LAB — TSH: TSH: 3.726 u[IU]/mL (ref 0.350–4.500)

## 2020-10-29 NOTE — Progress Notes (Addendum)
[place] [facility] [place] NAME: Chelsea [name]    MR#:  [id number]  DATE OF BIRTH:  1946-09-21  SUBJECTIVE:  CHIEF COMPLAINT:   Chief Complaint  Patient presents with  . Urinary Frequency  not able to manage at her independent living as she is very weak REVIEW OF SYSTEMS:  Review of Systems  Constitutional: Positive for malaise/fatigue. Negative for diaphoresis, fever and weight loss.  HENT: Negative for ear discharge, ear pain, hearing loss, nosebleeds, sore throat and tinnitus.   Eyes: Negative for blurred vision and pain.  Respiratory: Negative for cough, hemoptysis, shortness of breath and wheezing.   Cardiovascular: Negative for chest pain, palpitations, orthopnea and leg swelling.  Gastrointestinal: Negative for abdominal pain, blood in stool, constipation, diarrhea, heartburn, nausea and vomiting.  Genitourinary: Negative for dysuria, frequency and urgency.  Musculoskeletal: Positive for back pain, falls and neck pain. Negative for myalgias.  Skin: Negative for itching and rash.  Neurological: Negative for dizziness, tingling, tremors, focal weakness, seizures, weakness and headaches.  Psychiatric/Behavioral: Negative for depression. The patient is not nervous/anxious.    DRUG ALLERGIES:   Allergies  Allergen Reactions  . Penicillins     "Everything turned black"   VITALS:  Blood pressure 111/62, pulse 76, temperature 98 F (36.7 C), temperature source Oral, resp. rate 17, height 4\' 9"  (1.448 m), weight 80.7 kg, SpO2 99 %. PHYSICAL EXAMINATION:  Physical Exam Constitutional:      Appearance: She is obese.  HENT:     Head: Normocephalic and atraumatic.  Eyes:     Extraocular Movements: EOM normal.     Conjunctiva/sclera: Conjunctivae normal.     Pupils: Pupils are equal, round, and reactive to light.  Neck:     Thyroid: No thyromegaly.     Trachea: No tracheal deviation.  Cardiovascular:     Rate and Rhythm: Normal rate and regular  rhythm.     Heart sounds: Normal heart sounds.  Pulmonary:     Effort: Pulmonary effort is normal. No respiratory distress.     Breath sounds: Normal breath sounds. No wheezing.  Chest:     Chest wall: No tenderness.  Abdominal:     General: Bowel sounds are normal. There is no distension.     Palpations: Abdomen is soft.     Tenderness: There is no abdominal tenderness.  Musculoskeletal:        General: Normal range of motion.     Cervical back: Normal range of motion and neck supple.  Skin:    General: Skin is warm and dry.     Findings: No rash.  Neurological:     Mental Status: She is alert and oriented to person, place, and time.     Cranial Nerves: No cranial nerve deficit.     Comments: Global weakness, difficult exam    LABORATORY PANEL:  Female CBC Recent Labs  Lab 10/29/20 0410  WBC 4.8  HGB 11.2*  HCT 35.1*  PLT 216   ------------------------------------------------------------------------------------------------------------------ Chemistries  Recent Labs  Lab 10/28/20 1409 10/29/20 0410  NA 142 140  K 4.0 3.6  CL 99 99  CO2 32 31  GLUCOSE 86 89  BUN 14 15  CREATININE 0.81 0.74  CALCIUM 9.3 9.3  AST 27  --   ALT 29  --   ALKPHOS 70  --   BILITOT 0.8  --    RADIOLOGY:  No results found. ASSESSMENT AND PLAN:  75 y.o. 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  Patient was discharged 4 days ago back to her assisted living facility.  She returned to the emergency room today after facility called EMS on account of frequent falls and altered mental status from baseline.  Acute debility/generalized weakness most likely due to recent COVID-19/recent falls with rhabdomyolysis.  Repeat CPK levels were within normal limits. continue IV fluids.  PT.OT recommends SNF.  MRI brain from previous admission  did not show any evidence of acute CVA.  No indication for another repeat.    Multilevel severe Cervical spine and foraminal stenosis: More prominent at C4-C5.  Likely may be contributing to right upper extremity weakness.  Patient has been offered surgical options in the past.  She had a neurology follow-up scheduled but due to readmission, has been unable to follow-up.  Neuro and Ortho input may be warranted during the course of the stay.  MRI of the cervical spine shows severe spinal stenosis at multi level and foraminal stenosis Will c/s neurosurgery - d/w Dr Lacinda Axon. They will see her in morning tomorrow Lidocaine patch for pain control She follows with Dr Melrose Nakayama [facility])  Hypertension: Blood pressure stable.  Will resume home blood pressure medications.  Mild dehydration: IV hydration  Recent diagnosis of COVID-19: Patient completed isolation protocol a couple of days ago.  She remains asymptomatic.  Recent history of rhabdomyolysis secondary to fall: Resolved.  CPK level within normal limits.  History of anxiety and depression: Patient is on Xanax and Prozac.  Obesity Body mass index is 38.52 kg/m.    Status is: Inpatient  Remains inpatient appropriate because:Ongoing active pain requiring inpatient pain management   Dispo: The patient is from: ALF/[facility]              Anticipated d/c is to: SNF              Anticipated d/c date is: 2 days              Patient currently is not medically stable to d/c.   Difficult to place patient No    DVT prophylaxis:       enoxaparin (LOVENOX) injection 40 mg Start: 10/28/20 2000 SCDs Start: 10/28/20 1914     Family Communication:  "discussed with patient"   All the records are reviewed and case discussed with Care Management/Social Worker. Management plans discussed with the patient, nursing, neurosurgery and they are in agreement.  CODE STATUS: Full Code Level of care: Med-Surg  TOTAL TIME TAKING CARE OF THIS  PATIENT: 35 minutes.   More than 50% of the time was spent in counseling/coordination of care: YES  POSSIBLE D/C IN 1-2 DAYS, DEPENDING ON CLINICAL CONDITION.   Max Sane M.D on 10/29/2020 at 9:45 PM  [facility]   CC: Primary care physician; Verl Bangs, FNP  Note: This dictation was prepared with Dragon dictation along with smaller phrase technology. Any transcriptional errors that result from this process are unintentional.

## 2020-10-29 NOTE — Evaluation (Signed)
Physical Therapy Evaluation Patient Details Name: Chelsea [name] MRN: [id number] DOB: Mar 10, 1946 Today's Date: 10/29/2020   History of Present Illness  Chelsea [name] is a 75yo F who comes to [facility] on 1/24 after continued weakness at home since DC from hospital 4d prior, concerns about thriving at home. Pt sustained >5 falls in that time. DTR expressed concerns that pt may need a higher level of care now. PMH: HTN, HLD, urinary incontinence, GAD. sizure d/o. 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 DC 1/20 back to home at [place] c HHPT/HHOT, referral for palliative care. Last week, prior to DC pt was able to ~312ft with PT. Pt had a(+) COVID test during last admission on 10/21/20.  Clinical Impression  Pt admitted with above diagnosis. Pt currently with functional limitations due to the deficits listed below (see "PT Problem List"). Upon entry, pt in bed, awake and agreeable to participate, working with OT already. The pt is alert, pleasant, interactive, and able to provide info regarding prior level of function, both in tolerance and independence.  Patient's performance this date reveals decreased ability, independence, and tolerance in performing all basic mobility required for performance of activities of daily living. Pt requires +2 assist and chair follow for any appreciable AMB capacity, which is still labored and unrealistic at present given current level of deficit. Pt requires additional DME, close physical assistance, and cues for safe participate in mobility. Pt will benefit from skilled PT intervention to increase independence and safety with basic mobility in preparation for discharge to the venue listed below.         Follow Up Recommendations SNF;Supervision for mobility/OOB;Supervision - Intermittent    Equipment Recommendations  Other (comment) (to be determined, unclear at this time)     Recommendations for Other Services       Precautions / Restrictions Precautions Precautions: Fall Restrictions Weight Bearing Restrictions: No      Mobility  Bed Mobility Overal bed mobility: Needs Assistance Bed Mobility: Supine to Sit;Sit to Supine     Supine to sit: Max assist Sit to supine: Max assist;+2 for physical assistance;+2 for safety/equipment   General bed mobility comments: additional time, very slow, labored effort    Transfers Overall transfer level: Needs assistance Equipment used: Rolling walker (2 wheeled);1 person hand held assist Transfers: Sit to/from [facility] Sit to Stand: Min assist Stand pivot transfers: Mod assist       General transfer comment: extensiv cues for UE placement, ,max effort to rise several times, difficulty with establishing balance, has retropulsive lean. Reports tightness in bilat legs making mobility difficult.  Ambulation/Gait Ambulation/Gait assistance: Min assist Gait Distance (Feet): 6 Feet Assistive device: Rolling walker (2 wheeled) Gait Pattern/deviations: Ataxic Gait velocity: <0.20m/s   General Gait Details: circumduction swing phase, very labored, appears unsafe, balance difficult; begins to attempt sitting after ~5 feet without warning.  Stairs            Wheelchair Mobility    Modified Rankin (Stroke Patients Only)       Balance Overall balance assessment: Needs assistance Sitting-balance support: Single extremity supported;Feet supported;Feet unsupported Sitting balance-Leahy Scale: Good   Postural control: Posterior lean Standing balance support: Bilateral upper extremity supported;During functional activity Standing balance-Leahy Scale: Poor Standing balance comment: delayed balance acquisition, requires additional, several attempts.  Pertinent Vitals/Pain Pain Assessment: No/denies pain    Home Living Family/patient expects to  be discharged to:: Private residence ([place]) Living Arrangements: Alone Available Help at Discharge: Family;Neighbor;Available PRN/intermittently Type of Home: Independent living facility Home Access: Level entry     Home Layout: One level Home Equipment: Walker - 2 wheels;Wheelchair - manual Additional Comments: [place] living    Prior Function Level of Independence: Independent with assistive device(s)         Comments: Pt reports that she drives and stays relatively active     Hand Dominance        Extremity/Trunk Assessment   Upper Extremity Assessment Upper Extremity Assessment: Generalized weakness    Lower Extremity Assessment Lower Extremity Assessment: Generalized weakness    Cervical / Trunk Assessment Cervical / Trunk Assessment:  (morbid obesity, large habitus)  Communication   Communication: No difficulties  Cognition Arousal/Alertness: Awake/alert Behavior During Therapy: WFL for tasks assessed/performed Overall Cognitive Status: Within Functional Limits for tasks assessed                                        General Comments      Exercises     Assessment/Plan    PT Assessment Patient needs continued PT services  PT Problem List Decreased activity tolerance;Decreased strength;Decreased balance;Decreased knowledge of use of DME;Decreased safety awareness       PT Treatment Interventions DME instruction;Gait training;Therapeutic activities;Therapeutic exercise;Functional mobility training;Balance training;Patient/family education;Neuromuscular re-education    PT Goals (Current goals can be found in the Care Plan section)  Acute Rehab PT Goals Patient Stated Goal: regain ability to walk PT Goal Formulation: With patient Time For Goal Achievement: 11/12/20 Potential to Achieve Goals: Poor    Frequency Min 2X/week   Barriers to discharge        Co-evaluation PT/OT/SLP Co-Evaluation/Treatment:  Yes Reason for Co-Treatment: Complexity of the patient's impairments (multi-system involvement);For patient/therapist safety;To address functional/ADL transfers PT goals addressed during session: Mobility/safety with mobility;Balance;Proper use of DME OT goals addressed during session: ADL's and self-care;Proper use of Adaptive equipment and DME       AM-PAC PT "6 Clicks" Mobility  Outcome Measure Help needed turning from your back to your side while in a flat bed without using bedrails?: Total Help needed moving from lying on your back to sitting on the side of a flat bed without using bedrails?: Total Help needed moving to and from a bed to a chair (including a wheelchair)?: Total Help needed standing up from a chair using your arms (e.g., wheelchair or bedside chair)?: Total Help needed to walk in hospital room?: Total Help needed climbing 3-5 steps with a railing? : Total 6 Click Score: 6    End of Session Equipment Utilized During Treatment: Gait belt Activity Tolerance: Patient limited by fatigue;Patient tolerated treatment well Patient left: with call bell/phone within reach;in bed   PT Visit Diagnosis: Muscle weakness (generalized) (M62.81);Difficulty in walking, not elsewhere classified (R26.2)    Time: 1439-1510 PT Time Calculation (min) (ACUTE ONLY): 31 min   Charges:   PT Evaluation $PT Eval High Complexity: 1 High          3:34 PM, 10/29/20 Etta Grandchild, PT, DPT Physical Therapist - [facility]  [phone] ([place])    [place] C 10/29/2020, 3:26 PM

## 2020-10-29 NOTE — Evaluation (Signed)
Occupational Therapy Evaluation Patient Details Name: Chelsea [name] MRN: [id number] DOB: 07-28-1946 Today's Date: 10/29/2020    History of Present Illness Chelsea [name] is a 75yo F who comes to [facility] on 1/24 after continued weakness at home since DC from hospital 4d prior, concerns about thriving at home. Pt sustained >5 falls in that time. DTR expressed concerns that pt may need a higher level of care now. PMH: HTN, HLD, urinary incontinence, GAD. sizure d/o. 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 DC 1/20 back to home at [place] c HHPT/HHOT, referral for palliative care. Last week, prior to DC pt was able to ~385ft with PT. Pt had a(+) COVID test during last admission on 10/21/20.   Clinical Impression   Patient presenting with decreased I in self care, balance, functional mobility/transfer, endurance, and safety awareness.   Patient reports living at independent living facility and utilizing RW for functional transfers and mobility PTA. Pt currently drives and performs all her own IADL tasks. Patient currently functioning requiring mod - max A overall with use of +2 this session to manage equipment and for safety. Pt has had multiple falls in the last few days at home prior to admission. OT recommends short term rehab stay to address functional deficits.  Patient will benefit from acute OT to increase overall independence in the areas of ADLs, functional mobility, and safety awareness in order to safely discharge to next venue of care.    Follow Up Recommendations  SNF    Equipment Recommendations  Other (comment) (defer to next venue of care)       Precautions / Restrictions Precautions Precautions: Fall Restrictions Weight Bearing Restrictions: No      Mobility Bed Mobility Overal bed mobility: Needs Assistance Bed Mobility: Supine to Sit;Sit to Supine     Supine to sit: Max assist Sit to supine:  Max assist;+2 for physical assistance;+2 for safety/equipment   General bed mobility comments: additional time, very slow, labored effort    Transfers Overall transfer level: Needs assistance Equipment used: Rolling walker (2 wheeled);1 person hand held assist Transfers: Sit to/from [facility] Sit to Stand: Min assist Stand pivot transfers: Mod assist       General transfer comment: extensiv cues for UE placement, ,max effort to rise several times, difficulty with establishing balance, has retropulsive lean. Reports tightness in bilat legs making mobility difficult.    Balance Overall balance assessment: Needs assistance Sitting-balance support: Single extremity supported;Feet supported;Feet unsupported Sitting balance-Leahy Scale: Good   Postural control: Posterior lean Standing balance support: Bilateral upper extremity supported;During functional activity Standing balance-Leahy Scale: Poor Standing balance comment: delayed balance acquisition, requires additional, several attempts.                           ADL either performed or assessed with clinical judgement   ADL Overall ADL's : Needs assistance/impaired Eating/Feeding: Set up;Sitting   Grooming: Wash/dry hands;Wash/dry face;Bed level;Set up;Supervision/safety                                       Vision Patient Visual Report: No change from baseline              Pertinent Vitals/Pain Pain Assessment: No/denies pain     Hand Dominance Right   Extremity/Trunk Assessment Upper Extremity  Assessment Upper Extremity Assessment: Generalized weakness;RUE deficits/detail RUE Deficits / Details: decreased coordination and dexterity but demonstrates using in functional manner. Pt also reports sensation deficit and numbness   Lower Extremity Assessment Lower Extremity Assessment: Generalized weakness   Cervical / Trunk Assessment Cervical / Trunk Assessment:  (morbid  obesity, large habitus)   Communication Communication Communication: No difficulties   Cognition Arousal/Alertness: Awake/alert Behavior During Therapy: WFL for tasks assessed/performed Overall Cognitive Status: Within Functional Limits for tasks assessed                                                Home Living Family/patient expects to be discharged to:: Private residence Living Arrangements: Alone Available Help at Discharge: Family;Neighbor;Available PRN/intermittently Type of Home: Independent living facility Home Access: Level entry     Home Layout: One level     Bathroom Shower/Tub: Walk-in shower         Home Equipment: Environmental consultant - 2 wheels;Wheelchair - manual;Cane - single point   Additional Comments: [facility]      Prior Functioning/Environment Level of Independence: Independent with assistive device(s)        Comments: Pt reports that she drives and stays relatively active        OT Problem List: Decreased strength;Decreased activity tolerance;Impaired balance (sitting and/or standing);Decreased safety awareness;Impaired sensation;Impaired UE functional use;Cardiopulmonary status limiting activity;Decreased range of motion;Decreased coordination;Decreased knowledge of use of DME or AE;Decreased knowledge of precautions      OT Treatment/Interventions: Self-care/ADL training;Manual therapy;Therapeutic exercise;Neuromuscular education;Energy conservation;DME and/or AE instruction;Cognitive remediation/compensation;Therapeutic activities;Balance training;Patient/family education    OT Goals(Current goals can be found in the care plan section) Acute Rehab OT Goals Patient Stated Goal: to be able to care for myself OT Goal Formulation: With patient Time For Goal Achievement: 11/12/20 Potential to Achieve Goals: Fair ADL Goals Pt Will Perform Grooming: with supervision;standing Pt Will Transfer to Toilet: with  supervision;ambulating Pt Will Perform Toileting - Clothing Manipulation and hygiene: with supervision;sit to/from stand Pt Will Perform Tub/Shower Transfer: with supervision;ambulating  OT Frequency: Min 1X/week   Barriers to D/C:    none known at this time       Co-evaluation PT/OT/SLP Co-Evaluation/Treatment: Yes Reason for Co-Treatment: Complexity of the patient's impairments (multi-system involvement);For patient/therapist safety;To address functional/ADL transfers PT goals addressed during session: Mobility/safety with mobility;Proper use of DME;Balance OT goals addressed during session: ADL's and self-care;Proper use of Adaptive equipment and DME      AM-PAC OT "6 Clicks" Daily Activity     Outcome Measure Help from another person eating meals?: A Little Help from another person taking care of personal grooming?: A Little Help from another person toileting, which includes using toliet, bedpan, or urinal?: A Lot Help from another person bathing (including washing, rinsing, drying)?: A Lot Help from another person to put on and taking off regular upper body clothing?: A Little Help from another person to put on and taking off regular lower body clothing?: A Lot 6 Click Score: 15   End of Session Equipment Utilized During Treatment: Gait belt Nurse Communication: Mobility status  Activity Tolerance: Patient tolerated treatment well Patient left: in bed;with call bell/phone within reach;with bed alarm set  OT Visit Diagnosis: Unsteadiness on feet (R26.81);Repeated falls (R29.6);Muscle weakness (generalized) (M62.81)                Time: 6962-9528  OT Time Calculation (min): 45 min Charges:  OT General Charges $OT Visit: 1 Visit OT Evaluation $OT Eval Moderate Complexity: 1 Mod OT Treatments $Self Care/Home Management : 8-22 mins  Darleen Crocker, MS, OTR/L , CBIS ascom [phone]  10/29/20, 4:33 PM

## 2020-10-29 NOTE — Progress Notes (Signed)
[facility] Hospital Liaison RN note:  This patient is a pending palliative patient with [facility] out patient program. Jhonnie Garner, TOC is aware. Plan is to discharge to SNF. [facility] Liaison will follow for disposition.  Thank you.  Zandra Abts, RN [facility] Liaison [phone]

## 2020-10-30 DIAGNOSIS — U071 COVID-19: Secondary | ICD-10-CM

## 2020-10-30 DIAGNOSIS — R531 Weakness: Secondary | ICD-10-CM | POA: Diagnosis not present

## 2020-10-30 LAB — CBC
HCT: 34.8 % — ABNORMAL LOW (ref 36.0–46.0)
Hemoglobin: 10.7 g/dL — ABNORMAL LOW (ref 12.0–15.0)
MCH: 29.9 pg (ref 26.0–34.0)
MCHC: 30.7 g/dL (ref 30.0–36.0)
MCV: 97.2 fL (ref 80.0–100.0)
Platelets: 186 10*3/uL (ref 150–400)
RBC: 3.58 MIL/uL — ABNORMAL LOW (ref 3.87–5.11)
RDW: 14.6 % (ref 11.5–15.5)
WBC: 4.8 10*3/uL (ref 4.0–10.5)
nRBC: 0 % (ref 0.0–0.2)

## 2020-10-30 LAB — BASIC METABOLIC PANEL
Anion gap: 8 (ref 5–15)
BUN: 14 mg/dL (ref 8–23)
CO2: 32 mmol/L (ref 22–32)
Calcium: 9 mg/dL (ref 8.9–10.3)
Chloride: 102 mmol/L (ref 98–111)
Creatinine, Ser: 0.7 mg/dL (ref 0.44–1.00)
GFR, Estimated: >60 mL/min (ref >60)
Glucose, Bld: 92 mg/dL (ref 70–99)
Potassium: 4.1 mmol/L (ref 3.5–5.1)
Sodium: 142 mmol/L (ref 135–145)

## 2020-10-30 MED ORDER — ALPRAZOLAM 0.5 MG PO TABS: 0.5000 mg | ORAL_TABLET | Freq: Once | ORAL | Status: DC

## 2020-10-30 MED ORDER — TRAMADOL HCL 50 MG PO TABS
50.0000 mg | ORAL_TABLET | Freq: Four times a day (QID) | ORAL | Status: DC | PRN
  Administered 2020-10-30 – 2020-10-31 (×3): 50 mg via ORAL
  Filled 2020-10-30 (×3): qty 1

## 2020-10-30 MED ORDER — ALPRAZOLAM 0.5 MG PO TABS
0.5000 mg | ORAL_TABLET | Freq: Once | ORAL | Status: AC
  Administered 2020-10-30: 0.5 mg via ORAL
  Filled 2020-10-30: qty 1

## 2020-10-30 MED ORDER — GABAPENTIN 100 MG PO CAPS
200.0000 mg | ORAL_CAPSULE | Freq: Two times a day (BID) | ORAL | Status: DC
  Administered 2020-10-30 – 2020-10-31 (×3): 200 mg via ORAL
  Filled 2020-10-30 (×3): qty 2

## 2020-10-30 NOTE — Progress Notes (Signed)
Physical Therapy Treatment Patient Details Name: Chelsea [name] MRN: [id number] DOB: 24-Nov-1945 Today's Date: 10/30/2020    History of Present Illness Chelsea [name] is a 75yo F who comes to [facility] on 1/24 after continued weakness at home since DC from hospital 4d prior, concerns about thriving at home. Pt sustained >5 falls in that time. DTR expressed concerns that pt may need a higher level of care now. PMH: HTN, HLD, urinary incontinence, GAD. sizure d/o. 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 DC 1/20 back to home at [place] c HHPT/HHOT, referral for palliative care. Last week, prior to DC pt was able to ~324ft with PT. Pt had a(+) COVID test during last admission on 10/21/20.    PT Comments    Pt received in bed, anxious to get up and stretch legs.  Pt required ModA to transition supine to sit with HOB raised and use of side rail. Sitting EOB x 3 minutes unsupported while reaching out of BOS with SBA.  Sit to stand to RW with MinA, pt unable to assume upright stance.  Pt completed gait training in room ~100ft with flexed forward posture and decreased step length.  Pt assisted to bedside recliner to complete B LE AROM with good tolerance.  Pt with increased activity level today, remains very motivated with desire to return to [facility] once mobility improves. Pt will benefit from SNF placement upon d/c per POC.   Follow Up Recommendations  SNF;Supervision for mobility/OOB;Supervision - Intermittent     Equipment Recommendations  Other (comment)    Recommendations for Other Services       Precautions / Restrictions Precautions Precautions: Fall Restrictions Weight Bearing Restrictions: No    Mobility  Bed Mobility Overal bed mobility: Needs Assistance Bed Mobility: Supine to Sit;Sit to Supine     Supine to sit: Mod assist;HOB elevated     General bed mobility comments: good tolerance for sitting  EOB, no LOB unsupported  Transfers Overall transfer level: Needs assistance Equipment used: Rolling walker (2 wheeled) Transfers: Sit to/from [facility] Sit to Stand: Min assist            Ambulation/Gait Ambulation/Gait assistance: Min guard Gait Distance (Feet):  (25) Assistive device: Rolling walker (2 wheeled) Gait Pattern/deviations: Ataxic;Decreased step length - right;Decreased step length - left;Trunk flexed     General Gait Details:  (Increased tolerance for gait training)   Stairs             Wheelchair Mobility    Modified Rankin (Stroke Patients Only)       Balance                                            Cognition Arousal/Alertness: Awake/alert Behavior During Therapy: WFL for tasks assessed/performed Overall Cognitive Status: Within Functional Limits for tasks assessed                                        Exercises General Exercises - Lower Extremity Ankle Circles/Pumps: AROM;10 reps Long Arc Quad: Strengthening;10 reps Hip Flexion/Marching: Strengthening;10 reps    General Comments        Pertinent Vitals/Pain Pain Assessment: No/denies pain    Home Living  Prior Function            PT Goals (current goals can now be found in the care plan section) Acute Rehab PT Goals Patient Stated Goal: to be able to care for myself    Frequency    Min 2X/week      PT Plan Current plan remains appropriate    Co-evaluation              AM-PAC PT "6 Clicks" Mobility   Outcome Measure  Help needed turning from your back to your side while in a flat bed without using bedrails?: Total Help needed moving from lying on your back to sitting on the side of a flat bed without using bedrails?: Total Help needed moving to and from a bed to a chair (including a wheelchair)?: Total Help needed standing up from a chair using your arms (e.g., wheelchair  or bedside chair)?: Total Help needed to walk in hospital room?: Total Help needed climbing 3-5 steps with a railing? : Total 6 Click Score: 6    End of Session Equipment Utilized During Treatment: Gait belt Activity Tolerance: Patient limited by fatigue;Patient tolerated treatment well Patient left: in chair;with call bell/phone within reach Nurse Communication: Mobility status PT Visit Diagnosis: Muscle weakness (generalized) (M62.81);Difficulty in walking, not elsewhere classified (R26.2)     Time: 5329-9242 PT Time Calculation (min) (ACUTE ONLY): 44 min  Charges:  $Gait Training: 8-22 mins $Therapeutic Exercise: 8-22 mins $Therapeutic Activity: 8-22 mins                     Mikel Cella, PTA   Josie Dixon 10/30/2020, 4:53 PM

## 2020-10-30 NOTE — TOC Initial Note (Signed)
Transition of Care [facility]) - Initial/Assessment Note    Patient Details  Name: Chelsea [name] MRN: [id number] Date of Birth: [birth year]  Transition of Care [facility] CM/SW Contact:    Chelsea Ammons, RN Phone Number: 10/30/2020, 9:17 AM  Clinical Narrative:    RNCM reached out to patient in room via telephone for assessment. Patient reports she is eating breakfast and requests this CM talk to her daughter. RNCM placed call to patient's daughter Chelsea [name] and left VM for return call.                    Patient Goals and CMS Choice        Expected Discharge Plan and Services                                                Prior Living Arrangements/Services                       Activities of Daily Living Home Assistive Devices/Equipment: Cytogeneticist (specify type) ADL Screening (condition at time of admission) Patient's cognitive ability adequate to safely complete daily activities?: Yes Is the patient deaf or have difficulty hearing?: No Does the patient have difficulty seeing, even when wearing glasses/contacts?: No Does the patient have difficulty concentrating, remembering, or making decisions?: No Patient able to express need for assistance with ADLs?: Yes Does the patient have difficulty dressing or bathing?: No Independently performs ADLs?: Yes (appropriate for developmental age) Does the patient have difficulty walking or climbing stairs?: Yes Weakness of Legs: Both Weakness of Arms/Hands: Both  Permission Sought/Granted                  Emotional Assessment              Admission diagnosis:  Cervical stenosis of spine [M48.02] Age-related physical debility [R54] Multiple falls [R29.6] Weakness [R53.1] Patient Active Problem List   Diagnosis Date Noted  . Weakness 10/29/2020  . Age-related physical debility 10/28/2020  . Rhabdomyolysis 10/22/2020  . Right arm weakness 07/09/2020  . Right leg weakness 07/09/2020  . Urinary  tract infection without hematuria 07/09/2020  . Abnormal urinalysis 07/09/2020  . Memory difficulties 07/09/2020  . Chronic anemia 09/26/2018  . History of gastritis 05/03/2018  . History of Helicobacter pylori infection 05/03/2018  . Hx of adenomatous colonic polyps 05/03/2018  . BMI 40.0-44.9, adult ([place]) 03/15/2018  . Postmenopausal 03/15/2018  . Depression, major, recurrent, in partial remission ([place]) 01/11/2018  . Anxiety state 01/11/2018  . History of scoliosis 01/11/2018  . Pure hypercholesterolemia 01/11/2018  . Arthritis of big toe 10/19/2012  . Sinusitis 08/26/2012  . Physical exam, annual 05/16/2012  . Neck pain 04/11/2012  . Allergic conjunctivitis 06/23/2011  . Oral ulcer 06/23/2011  . GAD (generalized anxiety disorder) 05/14/2011  . Essential hypertension 02/06/2011  . Hyperlipidemia 02/06/2011  . Spinal stenosis of lumbosacral region 02/06/2011  . S/P breast biopsy 02/06/2011   PCP:  Chelsea Bangs, FNP Pharmacy:   [id number] [address], [place] [address] Phone: [phone] Fax: [phone]     Social Determinants of Health (SDOH) Interventions    Readmission Risk Interventions No flowsheet data found.

## 2020-10-30 NOTE — Progress Notes (Signed)
Patient ID: Chelsea [name], female   DOB: 06-Mar-1946, 75 y.o.   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  Assessment & Plan:   Acute debility/generalized weakness most likely due to recent COVID-19/recent falls with rhabdomyolysis -Repeat CPK improved.  Off IV fluids. -MRI brain from previous admission did not show any evidence of acute CVA -PT/OT recommending SNF.  Social worker consulted.  Multilevel severe cervical spinal and foraminal stenosis: Most prominent at C4-C5 -Likely may be contributing to right upper extremity weakness. Patient has been offered surgical options in the past. She had a neurology follow-up scheduled but due to readmission, has been unable to follow-up. -MRI of the cervical spine shows severe spinal stenosis at multi level and foraminal stenosis -Follow neurosurgery recommendations -Continue lidocaine patch for pain control  Hypertension -Blood pressure stable.  Continue triamterene hydrochlorothiazide  Hyperlipidemia--continue statin  Mild dehydration -Resolved with IV fluids  Recent diagnosis of COVID-19 -Tested positive on 10/21/2020.  Currently asymptomatic.  Respiratory status stable and on room air.  History of anxiety and depression -Continue Prozac and as needed Xanax  Obesity -Outpatient follow-up  DVT prophylaxis: Lovenox Code Status: Full Family Communication: None Disposition Plan: Status is: Inpatient  Remains inpatient appropriate because:Inpatient level of care appropriate due to severity of  illness   Dispo:  Patient From: Assisted Living Facility  Planned Disposition: Skilled Nursing Facility  Expected discharge date: 10/31/2020  Medically stable for discharge: Yes    Consultants: Neurosurgery  Procedures: None  Antimicrobials: None   Subjective: Patient seen and examined at bedside.  Feels very weak.  No overnight fever, vomiting or worsening shortness of breath reported.  Objective: Vitals:   10/30/20 0022 10/30/20 0519 10/30/20 0843 10/30/20 1220  BP: 132/61 123/71 127/69 99/64  Pulse: 73 80 79 75  Resp: 15 16 16 17   Temp: 98.2 F (36.8 C) 98.4 F (36.9 C) 99.7 F (37.6 C) 99.6 F (37.6 C)  TempSrc: Oral Oral Oral Oral  SpO2: 98% 97% 100% 100%  Weight:      Height:        Intake/Output Summary (Last 24 hours) at 10/30/2020 1334 Last data filed at 10/30/2020 1100 Gross per 24 hour  Intake --  Output 700 ml  Net -700 ml   Filed Weights   10/28/20 1405  Weight: 80.7 kg    Examination:  General exam: Appears calm and comfortable.  Looks chronically ill.  Poor historian Respiratory system: Bilateral decreased breath sounds at bases with some scattered crackles Cardiovascular system: S1 & S2 heard, Rate controlled Gastrointestinal system: Abdomen is nondistended, soft and nontender. Normal bowel sounds heard. Extremities: No cyanosis, clubbing; trace lower extremity edema   Data Reviewed: I have personally reviewed following labs and imaging studies  CBC: Recent Labs  Lab 10/24/20 0402 10/28/20 1409 10/29/20 0410 10/30/20 0627  WBC 5.3 4.6 4.8 4.8  NEUTROABS 2.2 2.0  --   --   HGB 10.5* 11.4* 11.2* 10.7*  HCT 32.5* 36.3 35.1* 34.8*  MCV 93.7 96.0 94.1 97.2  PLT 193 217 216 186   Basic Metabolic Panel: Recent Labs  Lab 10/24/20 0402 10/28/20 1409 10/29/20 0410 10/30/20  0627  NA 143 142 140 142  K 3.4* 4.0 3.6 4.1  CL 107 99 99 102  CO2 29 32 31 32  GLUCOSE 84 86 89 92  BUN 17 14 15 14   CREATININE 0.78 0.81 0.74 0.70   CALCIUM 7.9* 9.3 9.3 9.0   GFR: Estimated Creatinine Clearance: 53.1 mL/min (by C-G formula based on SCr of 0.7 mg/dL). Liver Function Tests: Recent Labs  Lab 10/24/20 0402 10/28/20 1409  AST 32 27  ALT 29 29  ALKPHOS 51 70  BILITOT 0.7 0.8  PROT 5.9* 6.7  ALBUMIN 2.8* 3.3*   No results for input(s): LIPASE, AMYLASE in the last 168 hours. No results for input(s): AMMONIA in the last 168 hours. Coagulation Profile: No results for input(s): INR, PROTIME in the last 168 hours. Cardiac Enzymes: Recent Labs  Lab 10/23/20 1532 10/24/20 0402 10/28/20 1621  CKTOTAL 598* 418* 160   BNP (last 3 results) No results for input(s): PROBNP in the last 8760 hours. HbA1C: No results for input(s): HGBA1C in the last 72 hours. CBG: No results for input(s): GLUCAP in the last 168 hours. Lipid Profile: No results for input(s): CHOL, HDL, LDLCALC, TRIG, CHOLHDL, LDLDIRECT in the last 72 hours. Thyroid Function Tests: Recent Labs    10/29/20 0410  TSH 3.726   Anemia Panel: No results for input(s): VITAMINB12, FOLATE, FERRITIN, TIBC, IRON, RETICCTPCT in the last 72 hours. Sepsis Labs: Recent Labs  Lab 10/23/20 1546 10/24/20 0402  PROCALCITON <0.10 <0.10    Recent Results (from the past 240 hour(s))  Resp Panel by RT-PCR (Flu A&B, Covid) Nasopharyngeal Swab     Status: Abnormal   Collection Time: 10/21/20 11:23 PM   Specimen: Nasopharyngeal Swab; Nasopharyngeal(NP) swabs in vial transport medium  Result Value Ref Range Status   SARS Coronavirus 2 by RT PCR POSITIVE (A) NEGATIVE Final    Comment: RESULT CALLED TO, READ BACK BY AND VERIFIED WITH: AMY COHEN RN 0118 10/22/20 HNM (NOTE) SARS-CoV-2 target nucleic acids are DETECTED.  The SARS-CoV-2 RNA is generally detectable in upper respiratory specimens during the acute phase of infection. Positive results are indicative of the presence of the identified virus, but do not rule out bacterial infection or co-infection with other  pathogens not detected by the test. Clinical correlation with patient history and other diagnostic information is necessary to determine patient infection status. The expected result is Negative.  Fact Sheet for Patients: EntrepreneurPulse.com.au  Fact Sheet for Healthcare Providers: IncredibleEmployment.be  This test is not yet approved or cleared by the [place] FDA and  has been authorized for detection and/or diagnosis of SARS-CoV-2 by FDA under an Emergency Use Authorization (EUA).  This EUA will remain in effect (meaning this test can be use d) for the duration of  the COVID-19 declaration under Section 564(b)(1) of the Act, 21 U.S.C. section 360bbb-3(b)(1), unless the authorization is terminated or revoked sooner.     Influenza A by PCR NEGATIVE NEGATIVE Final   Influenza B by PCR NEGATIVE NEGATIVE Final    Comment: (NOTE) The Xpert Xpress SARS-CoV-2/FLU/RSV plus assay is intended as an aid in the diagnosis of influenza from Nasopharyngeal swab specimens and should not be used as a sole basis for treatment. Nasal washings and aspirates are unacceptable for Xpert Xpress SARS-CoV-2/FLU/RSV testing.  Fact Sheet for Patients: EntrepreneurPulse.com.au  Fact Sheet for Healthcare Providers: IncredibleEmployment.be  This test is not yet approved or cleared by the [place] FDA and has been authorized for detection and/or diagnosis of  SARS-CoV-2 by FDA under an Emergency Use Authorization (EUA). This EUA will remain in effect (meaning this test can be used) for the duration of the COVID-19 declaration under Section 564(b)(1) of the Act, 21 U.S.C. section 360bbb-3(b)(1), unless the authorization is terminated or revoked.  Performed at [facility], [address] [place], [place] [address]   Urine Culture     Status: Abnormal   Collection Time: 10/22/20  5:24 PM   Specimen: Urine,  Random  Result Value Ref Range Status   Specimen Description   Final    URINE, RANDOM Performed at [facility], [address] [place], [place] [address]    Special Requests   Final    NONE Performed at [facility], [address] [place], [place] [address]    Culture MULTIPLE SPECIES PRESENT, SUGGEST RECOLLECTION (A)  Final   Report Status 10/24/2020 FINAL  Final  MRSA PCR Screening     Status: None   Collection Time: 10/22/20  5:29 PM   Specimen: Nasopharyngeal  Result Value Ref Range Status   MRSA by PCR NEGATIVE NEGATIVE Final    Comment:        The GeneXpert MRSA Assay (FDA approved for NASAL specimens only), is one component of a comprehensive MRSA colonization surveillance program. It is not intended to diagnose MRSA infection nor to guide or monitor treatment for MRSA infections. Performed at [facility], [address], [place],  [address]          Radiology Studies: CT Head Wo Contrast  Result Date: 10/28/2020 CLINICAL DATA:  Multiple falls.  Recent COVID infection EXAM: CT HEAD WITHOUT CONTRAST CT CERVICAL SPINE WITHOUT CONTRAST TECHNIQUE: Multidetector CT imaging of the head and cervical spine was performed following the standard protocol without intravenous contrast. Multiplanar CT image reconstructions of the cervical spine were also generated. COMPARISON:  CT head 07/08/2019 FINDINGS: CT HEAD FINDINGS Brain: Mild atrophy. Mild to moderate patchy white matter hypodensity bilaterally is chronic and unchanged. Negative for acute infarct, hemorrhage, mass Vascular: Negative for hyperdense vessel Skull: Negative Sinuses/Orbits: Paranasal sinuses clear. No orbital lesion. Right cataract extraction Other: None CT CERVICAL SPINE FINDINGS Alignment: Normal Skull base and vertebrae: Negative for fracture Soft tissues and spinal canal: Negative Disc levels: Advanced disc degeneration and spurring. Prominent ossification posterior  longitudinal ligament extending from C4 through C7. Moderate spinal stenosis and moderate foraminal stenosis bilaterally at C3-4. Severe spinal stenosis at C4-5 with moderate to severe foraminal encroachment bilaterally. Large right-sided osteophyte C5-6 with severe right foraminal encroachment and moderate to severe spinal stenosis. Moderate spinal stenosis at C6-7 with severe left foraminal stenosis. Upper chest: Lung apices clear bilaterally. Other: None IMPRESSION: 1. Atrophy and chronic microvascular ischemic change in the white matter. No acute abnormality 2. Negative for cervical spine fracture 3. Advanced cervical spondylosis and O PLL. Severe multilevel spinal and foraminal stenosis as above. Electronically Signed   By: Franchot Gallo M.D.   On: 10/28/2020 16:55   CT Cervical Spine Wo Contrast  Result Date: 10/28/2020 CLINICAL DATA:  Multiple falls.  Recent COVID infection EXAM: CT HEAD WITHOUT CONTRAST CT CERVICAL SPINE WITHOUT CONTRAST TECHNIQUE: Multidetector CT imaging of the head and cervical spine was performed following the standard protocol without intravenous contrast. Multiplanar CT image reconstructions of the cervical spine were also generated. COMPARISON:  CT head 07/08/2019 FINDINGS: CT HEAD FINDINGS Brain: Mild atrophy. Mild to moderate patchy white matter hypodensity bilaterally is chronic and unchanged. Negative for acute infarct, hemorrhage,  mass Vascular: Negative for hyperdense vessel Skull: Negative Sinuses/Orbits: Paranasal sinuses clear. No orbital lesion. Right cataract extraction Other: None CT CERVICAL SPINE FINDINGS Alignment: Normal Skull base and vertebrae: Negative for fracture Soft tissues and spinal canal: Negative Disc levels: Advanced disc degeneration and spurring. 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 Moderate spinal stenosis and moderate foraminal stenosis bilaterally at C3-4. Severe spinal stenosis at C4-5 with moderate  to severe foraminal encroachment bilaterally. Large right-sided osteophyte C5-6 with severe right foraminal encroachment and moderate to severe spinal stenosis. Moderate spinal stenosis at C6-7 with severe left foraminal stenosis. Upper chest: Lung apices clear bilaterally. Other: None IMPRESSION: 1. Atrophy and chronic microvascular ischemic change in the white matter. No acute abnormality 2. Negative for cervical spine fracture 3. Advanced cervical spondylosis and O PLL. Severe multilevel spinal and foraminal stenosis as above. Electronically Signed   By: Franchot Gallo M.D.   On: 10/28/2020 16:55   MR Cervical Spine Wo Contrast  Result Date: 10/28/2020 CLINICAL DATA:  Myelopathy, acute or progressive. EXAM: MRI CERVICAL SPINE WITHOUT CONTRAST TECHNIQUE: Multiplanar, multisequence MR imaging of the cervical spine was performed. No intravenous contrast was administered. COMPARISON:  CT same day. FINDINGS: Alignment: Straightening of the normal cervical lordosis. Vertebrae: No fracture or primary bone lesion. Spondylosis with endplate osteophytes. Incomplete ossification of the posterior longitudinal ligament from C3-T1, better shown by CT. Cord: See below regarding stenosis.  No primary cord lesion. 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 Disc levels: Foramen magnum is widely patent. Mild osteoarthritis at the C1-2 articulation but without encroachment upon the neural structures. C2-3: Minimal disc bulge. Minimal facet arthritis. No canal or foraminal stenosis. C3-4: Severe spinal stenosis with AP diameter of the canal in the midline only 4 mm. Cord flattening. Myelomalacia. Bilateral foraminal stenosis. C4-5: Severe spinal stenosis with AP diameter of the canal in the midline only 3.5 mm. Cord flattening. Myelomalacia. Bilateral foraminal stenosis. C5-6: Spondylosis with endplate osteophytes, OPLL and probable chronic calcified right  posterolateral disc herniation. Spinal stenosis with AP diameter in the midline 6.8 mm. Cord flattening with myelomalacia. Foraminal stenosis on the right likely to compress the right C6 nerve. C6-7: Endplate osteophytes and bulging of the disc. AP diameter of the canal in the midline 8.8 mm. No cord compression. Mild foraminal narrowing on the right and moderate foraminal narrowing on the left. C7-T1: Bulging of the disc. Facet degeneration. No compressive canal or foraminal stenosis. T1-2: Disc bulge.  Facet degeneration.  No compressive stenosis. IMPRESSION: 1. 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 AP diameter of the canal in the midline only 4 mm at the C3-4 level and 3.5 mm at C4-5 level. Diameter 6.8 mm at C5-6. 2. 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 3. Bilateral foraminal stenosis at C3-4 and C4-5 and right foraminal stenosis at C5-6, all likely associated with nerve root compression. 4. Spondylosis at C6-7 with mild foraminal narrowing on the right and moderate foraminal narrowing on the left. Electronically Signed   By: Nelson Chimes M.D.   On: 10/28/2020 20:04        Scheduled Meds: . aspirin  325 mg Oral Daily  . atorvastatin  40 mg Oral Daily  . enoxaparin (LOVENOX) injection  40 mg Subcutaneous Q24H  . FLUoxetine  20 mg Oral Daily  . gabapentin  200 mg Oral Daily  .  guaiFENesin  600 mg Oral BID  . lidocaine  1 patch Transdermal Q24H  . multivitamin with minerals  1 tablet Oral Daily  . triamterene-hydrochlorothiazide  1 tablet Oral Daily  . vitamin B-12  500 mcg Oral Daily   Continuous Infusions:        Aline August, MD [facility] 10/30/2020, 1:34 PM

## 2020-10-30 NOTE — TOC Progression Note (Signed)
Transition of Care [facility]) - Progression Note    Patient Details  Name: KARISMA MEISER MRN: [id number] Date of Birth: [birth year]  Transition of Care [facility]) CM/SW [place], RN Phone Number: 10/30/2020, 3:47 PM  Clinical Narrative:   RNCM was able to speak with patient's daughter [name] to discuss placement options. [name] reports that her mother has never had to go into skilled placement before but due to her falls she is hopeful for short term rehab and then that maybe her mother will be strong enough to go home. [name] is agreeable to a search of all area facilities but reports that there preferences are [facility], [facility] and [facility]  RNCM started PASSR and will fax in additional info when able, completed FL2 and started bed search.          Expected Discharge Plan and Services                                                 Social Determinants of Health (SDOH) Interventions    Readmission Risk Interventions No flowsheet data found.

## 2020-10-30 NOTE — NC FL2 (Signed)
[place] LEVEL OF CARE SCREENING TOOL     IDENTIFICATION  Patient Name: Chelsea [name] Birthdate: 03/30/1946 Sex: female Admission Date (Current Location): 10/28/2020  [place] and [place] Number:  Engineering geologist and Address:  [facility], [address], [place], [place] [address]      Provider Number: [id number]  Attending Physician Name and Address:  Aline August, MD  Relative Name and Phone Number:  [name] [phone]    Current Level of Care: Hospital Recommended Level of Care: [place] Prior Approval Number:    Date Approved/Denied:   PASRR Number:    Discharge Plan: SNF    Current Diagnoses: Patient Active Problem List   Diagnosis Date Noted  . Weakness 10/29/2020  . Age-related physical debility 10/28/2020  . Rhabdomyolysis 10/22/2020  . Right arm weakness 07/09/2020  . Right leg weakness 07/09/2020  . Urinary tract infection without hematuria 07/09/2020  . Abnormal urinalysis 07/09/2020  . Memory difficulties 07/09/2020  . Chronic anemia 09/26/2018  . History of gastritis 05/03/2018  . History of Helicobacter pylori infection 05/03/2018  . Hx of adenomatous colonic polyps 05/03/2018  . BMI 40.0-44.9, adult ([place]) 03/15/2018  . Postmenopausal 03/15/2018  . Depression, major, recurrent, in partial remission ([place]) 01/11/2018  . Anxiety state 01/11/2018  . History of scoliosis 01/11/2018  . Pure hypercholesterolemia 01/11/2018  . Arthritis of big toe 10/19/2012  . Sinusitis 08/26/2012  . Physical exam, annual 05/16/2012  . Neck pain 04/11/2012  . Allergic conjunctivitis 06/23/2011  . Oral ulcer 06/23/2011  . GAD (generalized anxiety disorder) 05/14/2011  . Essential hypertension 02/06/2011  . Hyperlipidemia 02/06/2011  . Spinal stenosis of lumbosacral region 02/06/2011  . S/P breast biopsy 02/06/2011    Orientation RESPIRATION BLADDER Height & Weight      Self,Time,Situation,Place  Normal External catheter Weight: 80.7 kg Height:  4\' 9"  (144.8 cm)  BEHAVIORAL SYMPTOMS/MOOD NEUROLOGICAL BOWEL NUTRITION STATUS      Continent Diet (2 gram Sodium)  AMBULATORY STATUS COMMUNICATION OF NEEDS Skin   Extensive Assist Verbally Bruising,Skin abrasions                       Personal Care Assistance Level of Assistance  Bathing,Feeding,Dressing Bathing Assistance: Limited assistance Feeding assistance: Independent Dressing Assistance: Limited assistance     Functional Limitations Info             SPECIAL CARE FACTORS FREQUENCY  PT (By licensed PT),OT (By licensed OT)                    Contractures Contractures Info: Not present    Additional Factors Info  Code Status,Allergies Code Status Info: Full Allergies Info: Penicillins           Current Medications (10/30/2020):  This is the current hospital active medication list Current Facility-Administered Medications  Medication Dose Route Frequency Provider Last Rate Last Admin  . acetaminophen (TYLENOL) tablet 650 mg  650 mg Oral Q6H PRN Artist Beach, MD   650 mg at 10/30/20 2426   Or  . acetaminophen (TYLENOL) suppository 650 mg  650 mg Rectal Q6H PRN Acheampong, Warnell Bureau, MD      . ALPRAZolam Duanne Moron) tablet 0.5 mg  0.5 mg Oral TID PRN Artist Beach, MD   0.5 mg at 10/30/20 0951  . aspirin tablet 325 mg  325 mg Oral Daily Acheampong, Warnell Bureau, MD   325 mg at 10/30/20 0911  .  atorvastatin (LIPITOR) tablet 40 mg  40 mg Oral Daily Acheampong, Warnell Bureau, MD   40 mg at 10/30/20 0910  . enoxaparin (LOVENOX) injection 40 mg  40 mg Subcutaneous Q24H Acheampong, Warnell Bureau, MD   40 mg at 10/29/20 2205  . FLUoxetine (PROZAC) capsule 20 mg  20 mg Oral Daily Acheampong, Warnell Bureau, MD   20 mg at 10/30/20 0910  . gabapentin (NEURONTIN) capsule 200 mg  200 mg Oral BID Alekh, Kshitiz, MD      . guaiFENesin (MUCINEX) 12 hr tablet 600 mg  600 mg Oral BID Artist Beach, MD    600 mg at 10/30/20 0910  . hydrALAZINE (APRESOLINE) injection 10 mg  10 mg Intravenous Q6H PRN Acheampong, Warnell Bureau, MD      . lidocaine (LIDODERM) 5 % 1 patch  1 patch Transdermal Q24H Blake Divine, MD   1 patch at 10/29/20 1700  . multivitamin with minerals tablet 1 tablet  1 tablet Oral Daily Acheampong, Warnell Bureau, MD   1 tablet at 10/30/20 0909  . senna-docusate (Senokot-S) tablet 1 tablet  1 tablet Oral QHS PRN Acheampong, Warnell Bureau, MD      . traMADol Veatrice Bourbon) tablet 50 mg  50 mg Oral Q6H PRN Aline August, MD   50 mg at 10/30/20 1457  . triamterene-hydrochlorothiazide (MAXZIDE-25) 37.5-25 MG per tablet 1 tablet  1 tablet Oral Daily Acheampong, Warnell Bureau, MD   1 tablet at 10/30/20 0910  . vitamin B-12 (CYANOCOBALAMIN) tablet 500 mcg  500 mcg Oral Daily Acheampong, Warnell Bureau, MD   500 mcg at 10/30/20 0909     Discharge Medications: Please see discharge summary for a list of discharge medications.  Relevant Imaging Results:  Relevant Lab Results:   Additional Information SS# [id number]  Shelbie Ammons, RN

## 2020-10-30 NOTE — Consult Note (Signed)
Referring Physician:  No referring provider defined for this encounter.  Primary Physician:  Verl Bangs, FNP  Chief Complaint:  weakness  History of Present Illness: 10/30/2020 Chelsea [name] is a 75 y.o. female who presents with the chief complaint of weakness, imbalance, and a fall.  She reports that she slid off the bed and off the toilet at some point over the past few days.  She was brought to the ER where evaluation showed global weakness.  She also suffered from a COVID diagnosis a few days ago.  She reports issues with tingling, numbness, and imbalance for years.  She has known about her cervical stenosis for many years.  She is having problems with her R arm in particular, and has significant loss of dexterity bilaterally.  She is also having weakness in her legs and trouble walking without assistance.  She uses either a wheelchair or a walker.   Chelsea [name] has clear symptoms of cervical myelopathy.  The symptoms are causing a significant impact on the patient's life.   Review of Systems:  A 10 point review of systems is negative, except for the pertinent positives and negatives detailed in the HPI.  Past Medical History: Past Medical History:  Diagnosis Date  . Anxiety   . Arthritis   . Depression   . Hyperlipidemia   . Hypertension   . Seizures ([place])   . Spinal stenosis   . Ulcer   . Urinary incontinence     Past Surgical History: Past Surgical History:  Procedure Laterality Date  . ABDOMINAL HYSTERECTOMY    . BACK SURGERY     due to polio  . BREAST BIOPSY Bilateral    neg  . BREAST BIOPSY Right 2011   neg/stereo  . CARPAL TUNNEL RELEASE    . CATARACT EXTRACTION Right 2020    Allergies: Allergies as of 10/28/2020 - Review Complete 10/28/2020  Allergen Reaction Noted  . Penicillins  02/06/2011    Medications:  Current Facility-Administered Medications:  .  acetaminophen (TYLENOL) tablet 650 mg, 650 mg, Oral, Q6H PRN, 650 mg at  10/30/20 0916 **OR** acetaminophen (TYLENOL) suppository 650 mg, 650 mg, Rectal, Q6H PRN, Acheampong, Warnell Bureau, MD .  ALPRAZolam Duanne Moron) tablet 0.5 mg, 0.5 mg, Oral, TID PRN, Artist Beach, MD, 0.5 mg at 10/30/20 0951 .  aspirin tablet 325 mg, 325 mg, Oral, Daily, Acheampong, Warnell Bureau, MD, 325 mg at 10/30/20 0911 .  atorvastatin (LIPITOR) tablet 40 mg, 40 mg, Oral, Daily, Acheampong, Warnell Bureau, MD, 40 mg at 10/30/20 0910 .  enoxaparin (LOVENOX) injection 40 mg, 40 mg, Subcutaneous, Q24H, Acheampong, Warnell Bureau, MD, 40 mg at 10/29/20 2205 .  FLUoxetine (PROZAC) capsule 20 mg, 20 mg, Oral, Daily, Acheampong, Warnell Bureau, MD, 20 mg at 10/30/20 0910 .  gabapentin (NEURONTIN) capsule 200 mg, 200 mg, Oral, Daily, Acheampong, Warnell Bureau, MD, 200 mg at 10/30/20 0910 .  guaiFENesin (MUCINEX) 12 hr tablet 600 mg, 600 mg, Oral, BID, Acheampong, Warnell Bureau, MD, 600 mg at 10/30/20 0910 .  hydrALAZINE (APRESOLINE) injection 10 mg, 10 mg, Intravenous, Q6H PRN, Acheampong, Warnell Bureau, MD .  lidocaine (LIDODERM) 5 % 1 patch, 1 patch, Transdermal, Q24H, Blake Divine, MD, 1 patch at 10/29/20 1700 .  LORazepam (ATIVAN) injection 1 mg, 1 mg, Intravenous, Q4H PRN, Blake Divine, MD .  multivitamin with minerals tablet 1 tablet, 1 tablet, Oral, Daily, Acheampong, Warnell Bureau, MD, 1 tablet at 10/30/20 0909 .  senna-docusate (Senokot-S) tablet 1 tablet,  1 tablet, Oral, QHS PRN, Acheampong, Warnell Bureau, MD .  triamterene-hydrochlorothiazide (MAXZIDE-25) 37.5-25 MG per tablet 1 tablet, 1 tablet, Oral, Daily, Acheampong, Warnell Bureau, MD, 1 tablet at 10/30/20 0910 .  vitamin B-12 (CYANOCOBALAMIN) tablet 500 mcg, 500 mcg, Oral, Daily, Acheampong, Warnell Bureau, MD, 500 mcg at 10/30/20 0909   Social History: Social History   Tobacco Use  . Smoking status: Never Smoker  . Smokeless tobacco: Never Used  Vaping Use  . Vaping Use: Never used  Substance Use Topics  . Alcohol use: Never  . Drug use: No    Family Medical History: Family History   Problem Relation Age of Onset  . Arthritis Mother   . Heart disease Mother   . Stroke Mother   . Hypertension Mother   . COPD Mother   . Sudden death Sister   . Other Father        unknown medical history    Physical Examination: Vitals:   10/30/20 0843 10/30/20 1220  BP: 127/69 99/64  Pulse: 79 75  Resp: 16 17  Temp: 99.7 F (37.6 C) 99.6 F (37.6 C)  SpO2: 100% 100%     General: Patient is well developed, well nourished, calm, collected, and in no apparent distress.  Psychiatric: Patient is non-anxious.  Head:  Pupils equal, round, and reactive to light.  ENT:  Oral mucosa appears well hydrated.  Neck:   Supple.  Full range of motion.  Respiratory: Patient is breathing without any difficulty.  Extremities: No edema.  Vascular: Palpable pulses in dorsal pedal vessels.  Skin:   On exposed skin, there are no abnormal skin lesions.  NEUROLOGICAL:  General: In no acute distress.   Awake, alert, oriented to person, place, and time.  Pupils equal round and reactive to light.  Facial tone is symmetric.  Tongue protrusion is midline.  There is no pronator drift.  ROM of spine: diminished  Palpation of spine: nontender.    Strength: Side Biceps Triceps Deltoid Interossei Grip Wrist Ext. Wrist Flex.  R 4 4- 4- 2 2 3 2   L 4+ 4+ 4+ 4- 4- 4- 4-   Side Iliopsoas Quads Hamstring PF DF EHL  R 4 5 5 5  4+ 5  L 4 5 5 5 5 5    Reflexes are 3+ and symmetric at the biceps, triceps, brachioradialis, patella and achilles.   Bilateral upper and lower extremity sensation is symmetric to light touch, but shows significant tingling.  Gait is untested - she cannot walk unaided. Hoffman's is present bilaterally.  Imaging: MRI C spine 10/28/2020 IMPRESSION: 1. 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 AP diameter of the canal in the midline only 4 mm at the C3-4 level and 3.5 mm at C4-5 level. Diameter 6.8 mm at  C5-6. 2. 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 3. Bilateral foraminal stenosis at C3-4 and C4-5 and right foraminal stenosis at C5-6, all likely associated with nerve root compression. 4. Spondylosis at C6-7 with mild foraminal narrowing on the right and moderate foraminal narrowing on the left.   Electronically Signed   By: Nelson Chimes M.D.   On: 10/28/2020 20:04  I have personally reviewed the images and agree with the above interpretation.  Labs: CBC Latest Ref Rng & Units 10/30/2020 10/29/2020 10/28/2020  WBC 4.0 - 10.5 K/uL 4.8 4.8 4.6  Hemoglobin 12.0 - 15.0 g/dL 10.7(L) 11.2(L) 11.4(L)  Hematocrit 36.0 - 46.0 %  34.8(L) 35.1(L) 36.3  Platelets 150 - 400 K/uL 186 216 217       Assessment and Plan: Ms. Popson is a pleasant 75 y.o. female with cervical myelopathy.  She has had this for a significant period of time, and has progressive symptoms.  I reviewed her imaging with her, and recommended surgery.  She fully understands the condition, but does not wish to consider surgery.    I would recommend PT, OT, and placement.  I will arrange outpatient follow up to confirm her decision not to intervene.    Geniya Fulgham K. Izora Ribas MD, [place] Dept. of Neurosurgery

## 2020-10-31 DIAGNOSIS — U071 COVID-19: Secondary | ICD-10-CM | POA: Diagnosis not present

## 2020-10-31 DIAGNOSIS — M4802 Spinal stenosis, cervical region: Secondary | ICD-10-CM | POA: Diagnosis not present

## 2020-10-31 DIAGNOSIS — R531 Weakness: Secondary | ICD-10-CM | POA: Diagnosis not present

## 2020-10-31 NOTE — Progress Notes (Signed)
Physical Therapy Treatment Patient Details Name: Chelsea [name] MRN: [id number] DOB: July 03, 1946 Today's Date: 10/31/2020    History of Present Illness Chelsea [name] is a 75yo F who comes to [facility] on 1/24 after continued weakness at home since DC from hospital 4d prior, concerns about thriving at home. Pt sustained >5 falls in that time. DTR expressed concerns that pt may need a higher level of care now. PMH: HTN, HLD, urinary incontinence, GAD. sizure d/o. 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 DC 1/20 back to home at [place] c HHPT/HHOT, referral for palliative care. Last week, prior to DC pt was able to ~350ft with PT. Pt had a(+) COVID test during last admission on 10/21/20.    PT Comments    Patient received in bed, finishing with lunch. Patient is agreeable to PT. She requires mod assist for supine to sit. Min assist with sit to stand from elevated bed. Patient is able to ambulate 25 feet with RW and min guard. At times ambulates at VERY slow pace. Very flexed posture and tends to push walker too far out. Patient will continue to benefit from skilled PT while here to improve functional independence and activity tolerance.       Follow Up Recommendations  SNF;Supervision for mobility/OOB;Supervision - Intermittent     Equipment Recommendations  None recommended by PT;Other (comment) (TBD)    Recommendations for Other Services       Precautions / Restrictions Precautions Precautions: Fall Restrictions Weight Bearing Restrictions: No    Mobility  Bed Mobility Overal bed mobility: Needs Assistance Bed Mobility: Supine to Sit     Supine to sit: Mod assist;HOB elevated     General bed mobility comments: requires assist to bring LEs off bed and pad used to scoot hips around and forward to edge of bed. Patient limited by pain.  Transfers Overall transfer level: Needs assistance Equipment used:  Rolling walker (2 wheeled) Transfers: Sit to/from Stand   Stand pivot transfers: From elevated surface;Mod assist       General transfer comment: Mod assist to stand from slightly elevated bed.  Ambulation/Gait Ambulation/Gait assistance: Min guard Gait Distance (Feet): 25 Feet Assistive device: Rolling walker (2 wheeled) Gait Pattern/deviations: Step-to pattern;Decreased step length - right;Decreased step length - left;Shuffle;Trunk flexed Gait velocity: decr   General Gait Details: patient ambulated with slow, very flexed posture. There are times where she will speed up unexpectedly. Inconsistent gait.   Stairs             Wheelchair Mobility    Modified Rankin (Stroke Patients Only)       Balance Overall balance assessment: Needs assistance Sitting-balance support: Feet supported Sitting balance-Leahy Scale: Fair     Standing balance support: Bilateral upper extremity supported;During functional activity Standing balance-Leahy Scale: Fair Standing balance comment: patient requires min guard and B UE support with standing                            Cognition Arousal/Alertness: Awake/alert Behavior During Therapy: WFL for tasks assessed/performed Overall Cognitive Status: Within Functional Limits for tasks assessed                                 General Comments: talkative      Exercises Other Exercises Other Exercises: LAQ, AP x 10 reps each  General Comments        Pertinent Vitals/Pain Pain Assessment: Faces Faces Pain Scale: Hurts little more Pain Location: R leg/arm neuropathy, L LE bruised from fall Pain Descriptors / Indicators: Sore;Discomfort Pain Intervention(s): Monitored during session    Home Living                      Prior Function            PT Goals (current goals can now be found in the care plan section) Acute Rehab PT Goals Patient Stated Goal: to be able to care for myself PT Goal  Formulation: With patient Time For Goal Achievement: 11/12/20 Potential to Achieve Goals: Fair Progress towards PT goals: Progressing toward goals    Frequency    Min 2X/week      PT Plan Current plan remains appropriate    Co-evaluation              AM-PAC PT "6 Clicks" Mobility   Outcome Measure  Help needed turning from your back to your side while in a flat bed without using bedrails?: A Little Help needed moving from lying on your back to sitting on the side of a flat bed without using bedrails?: A Lot Help needed moving to and from a bed to a chair (including a wheelchair)?: A Lot Help needed standing up from a chair using your arms (e.g., wheelchair or bedside chair)?: A Lot Help needed to walk in hospital room?: A Lot Help needed climbing 3-5 steps with a railing? : Total 6 Click Score: 12    End of Session Equipment Utilized During Treatment: Gait belt Activity Tolerance: Patient tolerated treatment well;Patient limited by fatigue Patient left: in chair;with call bell/phone within reach Nurse Communication: Mobility status PT Visit Diagnosis: Muscle weakness (generalized) (M62.81);Difficulty in walking, not elsewhere classified (R26.2);Pain Pain - Right/Left: Right     Time: 9244-6286 PT Time Calculation (min) (ACUTE ONLY): 32 min  Charges:  $Gait Training: 23-37 mins                     Belinda Schlichting, PT, [facility] 10/31/20,2:44 PM

## 2020-10-31 NOTE — TOC Progression Note (Signed)
Transition of Care [facility]) - Progression Note    Patient Details  Name: Chelsea [name] MRN: [id number] Date of Birth: Aug 03, 1946  Transition of Care [facility]) CM/SW [place], RN Phone Number: 10/31/2020, 11:16 AM  Clinical Narrative:   Patient still pending bed offers, additional information uploaded to [facility].          Expected Discharge Plan and Services                                                 Social Determinants of Health (SDOH) Interventions    Readmission Risk Interventions No flowsheet data found.

## 2020-10-31 NOTE — TOC Progression Note (Signed)
Transition of Care [facility]) - Progression Note    Patient Details  Name: TENESHA GARZA MRN: [id number] Date of Birth: [birth year]  Transition of Care [facility]) CM/SW [place], RN Phone Number: 10/31/2020, 3:40 PM  Clinical Narrative:   RNCM reached out to patient's daughter [name] to discuss bed offer for [facility]. [name] is on a flight out of town but as soon as she lands she will discuss with her mother. RNCM sent link for Medicare website for review as well. RNCM will follow up.          Expected Discharge Plan and Services                                                 Social Determinants of Health (SDOH) Interventions    Readmission Risk Interventions No flowsheet data found.

## 2020-10-31 NOTE — Progress Notes (Signed)
Patient ID: Chelsea [name], female   DOB: 09/08/1946, 75 y.o.   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  Assessment & Plan:   Acute debility/generalized weakness most likely due to recent COVID-19/recent falls with rhabdomyolysis -Repeat CPK improved.  Off IV fluids. -MRI brain from previous admission did not show any evidence of acute CVA -PT/OT recommending SNF.  Social worker following.  Multilevel severe cervical spinal and foraminal stenosis: Most prominent at C4-C5 -Likely may be contributing to right upper extremity weakness. Patient has been offered surgical options in the past. She had a neurology follow-up scheduled but due to readmission, has been unable to follow-up. -MRI of the cervical spine shows severe spinal stenosis at multi level and foraminal stenosis -Neurosurgery evaluation appreciated: Patient denies surgical intervention -Continue gabapentin, lidocaine patch and as needed Tylenol/tramadol for pain control  Hypertension -Blood pressure stable.  Continue triamterene hydrochlorothiazide  Hyperlipidemia--continue statin  Mild dehydration -Resolved with IV fluids  Recent diagnosis of COVID-19 -Tested positive on 10/21/2020.  Currently asymptomatic.  Respiratory status stable and on room air.  History of anxiety and depression -Continue Prozac and as needed Xanax  Obesity -Outpatient follow-up  DVT prophylaxis: Lovenox Code Status: Full Family Communication: None Disposition Plan: Status is: Inpatient  Remains inpatient appropriate  because:Inpatient level of care appropriate due to severity of illness   Dispo:  Patient From: [place]  Planned Disposition: [place]  Expected discharge date: 10/31/2020  Medically stable for discharge: Yes    Consultants: Neurosurgery  Procedures: None  Antimicrobials: None   Subjective: Patient seen and examined at bedside.  Complains of back and neck pain.  No overnight fever, vomiting, worsening shortness of breath reported.  Poor historian. Objective: Vitals:   10/30/20 1220 10/30/20 1703 10/30/20 2133 10/31/20 0546  BP: 99/64 122/70 127/62 106/67  Pulse: 75 87 78 84  Resp: 17 17 18 16   Temp: 99.6 F (37.6 C) 98.6 F (37 C) 98.3 F (36.8 C) 98.4 F (36.9 C)  TempSrc: Oral Oral  Oral  SpO2: 100% 100% 98% 94%  Weight:      Height:        Intake/Output Summary (Last 24 hours) at 10/31/2020 0736 Last data filed at 10/30/2020 1100 Gross per 24 hour  Intake --  Output 700 ml  Net -700 ml   Filed Weights   10/28/20 1405  Weight: 80.7 kg    Examination:  General exam: No acute distress.  Looks chronically ill.  Poor historian.  Currently on room air Respiratory system: Decreased breath sounds at bases bilaterally with some crackles; no wheezing  cardiovascular system: Rate controlled, S1-S2 heard Gastrointestinal system: Abdomen is nondistended, soft and nontender.  Bowel sounds are heard  extremities: Bilateral lower extremity mild edema present; no clubbing  Data Reviewed: I have personally reviewed following labs and imaging studies  CBC: Recent Labs  Lab 10/28/20 1409 10/29/20 0410 10/30/20 0627  WBC 4.6 4.8 4.8  NEUTROABS 2.0  --   --   HGB 11.4* 11.2* 10.7*  HCT 36.3 35.1* 34.8*  MCV 96.0 94.1 97.2  PLT 217 216 841   Basic Metabolic Panel:  Recent Labs  Lab 10/28/20 1409 10/29/20 0410 10/30/20 0627  NA 142 140 142  K 4.0 3.6 4.1  CL 99 99 102  CO2 32 31 32  GLUCOSE 86 89 92  BUN 14 15 14   CREATININE  0.81 0.74 0.70  CALCIUM 9.3 9.3 9.0   GFR: Estimated Creatinine Clearance: 53.1 mL/min (by C-G formula based on SCr of 0.7 mg/dL). Liver Function Tests: Recent Labs  Lab 10/28/20 1409  AST 27  ALT 29  ALKPHOS 70  BILITOT 0.8  PROT 6.7  ALBUMIN 3.3*   No results for input(s): LIPASE, AMYLASE in the last 168 hours. No results for input(s): AMMONIA in the last 168 hours. Coagulation Profile: No results for input(s): INR, PROTIME in the last 168 hours. Cardiac Enzymes: Recent Labs  Lab 10/28/20 1621  CKTOTAL 160   BNP (last 3 results) No results for input(s): PROBNP in the last 8760 hours. HbA1C: No results for input(s): HGBA1C in the last 72 hours. CBG: No results for input(s): GLUCAP in the last 168 hours. Lipid Profile: No results for input(s): CHOL, HDL, LDLCALC, TRIG, CHOLHDL, LDLDIRECT in the last 72 hours. Thyroid Function Tests: Recent Labs    10/29/20 0410  TSH 3.726   Anemia Panel: No results for input(s): VITAMINB12, FOLATE, FERRITIN, TIBC, IRON, RETICCTPCT in the last 72 hours. Sepsis Labs: No results for input(s): PROCALCITON, LATICACIDVEN in the last 168 hours.  Recent Results (from the past 240 hour(s))  Resp Panel by RT-PCR (Flu A&B, Covid) Nasopharyngeal Swab     Status: Abnormal   Collection Time: 10/21/20 11:23 PM   Specimen: Nasopharyngeal Swab; Nasopharyngeal(NP) swabs in vial transport medium  Result Value Ref Range Status   SARS Coronavirus 2 by RT PCR POSITIVE (A) NEGATIVE Final    Comment: RESULT CALLED TO, READ BACK BY AND VERIFIED WITH: AMY COHEN RN 0118 10/22/20 HNM (NOTE) SARS-CoV-2 target nucleic acids are DETECTED.  The SARS-CoV-2 RNA is generally detectable in upper respiratory specimens during the acute phase of infection. Positive results are indicative of the presence of the identified virus, but do not rule out bacterial infection or co-infection with other pathogens not detected by the test. Clinical correlation with patient  history and other diagnostic information is necessary to determine patient infection status. The expected result is Negative.  Fact Sheet for Patients: EntrepreneurPulse.com.au  Fact Sheet for Healthcare Providers: IncredibleEmployment.be  This test is not yet approved or cleared by the [place] FDA and  has been authorized for detection and/or diagnosis of SARS-CoV-2 by FDA under an Emergency Use Authorization (EUA).  This EUA will remain in effect (meaning this test can be use d) for the duration of  the COVID-19 declaration under Section 564(b)(1) of the Act, 21 U.S.C. section 360bbb-3(b)(1), unless the authorization is terminated or revoked sooner.     Influenza A by PCR NEGATIVE NEGATIVE Final   Influenza B by PCR NEGATIVE NEGATIVE Final    Comment: (NOTE) The Xpert Xpress SARS-CoV-2/FLU/RSV plus assay is intended as an aid in the diagnosis of influenza from Nasopharyngeal swab specimens and should not be used as a sole basis for treatment. Nasal washings and aspirates are unacceptable for Xpert Xpress SARS-CoV-2/FLU/RSV testing.  Fact Sheet for Patients: EntrepreneurPulse.com.au  Fact Sheet for Healthcare Providers: IncredibleEmployment.be  This test is not yet approved or cleared by the [place] FDA and has been authorized for detection and/or diagnosis of SARS-CoV-2 by FDA under an Emergency Use Authorization (EUA). This EUA will remain in effect (meaning  this test can be used) for the duration of the COVID-19 declaration under Section 564(b)(1) of the Act, 21 U.S.C. section 360bbb-3(b)(1), unless the authorization is terminated or revoked.  Performed at [facility], [address], [place], [place] [address]   Urine Culture     Status: Abnormal   Collection Time: 10/22/20  5:24 PM   Specimen: Urine, Random  Result Value Ref Range Status   Specimen Description   Final     URINE, RANDOM Performed at [facility], [address] [place], [place] [address]    Special Requests   Final    NONE Performed at [facility], [address] [place], [place] [address]    Culture MULTIPLE SPECIES PRESENT, SUGGEST RECOLLECTION (A)  Final   Report Status 10/24/2020 FINAL  Final  MRSA PCR Screening     Status: None   Collection Time: 10/22/20  5:29 PM   Specimen: Nasopharyngeal  Result Value Ref Range Status   MRSA by PCR NEGATIVE NEGATIVE Final    Comment:        The GeneXpert MRSA Assay (FDA approved for NASAL specimens only), is one component of a comprehensive MRSA colonization surveillance program. It is not intended to diagnose MRSA infection nor to guide or monitor treatment for MRSA infections. Performed at [facility], [address], [place], [place] [address]          Radiology Studies: No results found.      Scheduled Meds: . aspirin  325 mg Oral Daily  . atorvastatin  40 mg Oral Daily  . enoxaparin (LOVENOX) injection  40 mg Subcutaneous Q24H  . FLUoxetine  20 mg Oral Daily  . gabapentin  200 mg Oral BID  . guaiFENesin  600 mg Oral BID  . lidocaine  1 patch Transdermal Q24H  . multivitamin with minerals  1 tablet Oral Daily  . triamterene-hydrochlorothiazide  1 tablet Oral Daily  . vitamin B-12  500 mcg Oral Daily   Continuous Infusions:        Aline August, MD [facility] 10/31/2020, 7:36 AM

## 2020-10-31 NOTE — Progress Notes (Signed)
Please be advised that the above-named patient will require a short-term nursing home stay-anticipated 30 days or less for rehabilitation and strengthening. The plan is for return home.  

## 2020-11-01 DIAGNOSIS — R531 Weakness: Secondary | ICD-10-CM | POA: Diagnosis not present

## 2020-11-01 DIAGNOSIS — U071 COVID-19: Secondary | ICD-10-CM | POA: Diagnosis not present

## 2020-11-01 DIAGNOSIS — M4802 Spinal stenosis, cervical region: Secondary | ICD-10-CM | POA: Diagnosis not present

## 2020-11-01 LAB — SARS CORONAVIRUS 2 (TAT 6-24 HRS): SARS Coronavirus 2: NEGATIVE

## 2020-11-01 MED ORDER — TRAMADOL HCL 50 MG PO TABS: 50.0000 mg | ORAL_TABLET | Freq: Four times a day (QID) | ORAL | 0 refills | Status: DC | PRN

## 2020-11-01 MED ORDER — TRAMADOL HCL 50 MG PO TABS
50.0000 mg | ORAL_TABLET | Freq: Four times a day (QID) | ORAL | Status: DC | PRN
  Administered 2020-11-01: 50 mg via ORAL
  Administered 2020-11-02 – 2020-11-03 (×2): 100 mg via ORAL
  Filled 2020-11-01: qty 1
  Filled 2020-11-01 (×2): qty 2

## 2020-11-01 MED ORDER — GABAPENTIN 100 MG PO CAPS: 200.0000 mg | ORAL_CAPSULE | Freq: Three times a day (TID) | ORAL | 0 refills | Status: DC

## 2020-11-01 MED ORDER — ALPRAZOLAM 0.5 MG PO TABS: 0.5000 mg | ORAL_TABLET | Freq: Three times a day (TID) | ORAL | 0 refills | Status: DC | PRN

## 2020-11-01 MED ORDER — GABAPENTIN 100 MG PO CAPS
200.0000 mg | ORAL_CAPSULE | Freq: Three times a day (TID) | ORAL | Status: DC
  Administered 2020-11-01 – 2020-11-05 (×13): 200 mg via ORAL
  Filled 2020-11-01 (×13): qty 2

## 2020-11-01 NOTE — TOC Progression Note (Addendum)
Transition of Care [facility]) - Progression Note    Patient Details  Name: Chelsea [name] MRN: [id number] Date of Birth: [birth year]  Transition of Care [facility]) CM/SW [place], RN Phone Number: 11/01/2020, 1:57 PM  Clinical Narrative:   Patient accepted by [facility] and patient and family accepted bed. MD notified and discharge paperwork completed. RNCM notified by facility representative Gerald Stabs at 1:50pm that patient has an open insurance claim and for that reason they will not be able to accept patient. MD and bedside nurse notified.   3:20pm: RNCM reached out to patient's daughter [name] to discuss insurance situation. [name] asking questions this RNCM is unable to answer. Recommended she reach out to the facility and they would be able to better answer questions. Denise relayed that she would reach out.          Expected Discharge Plan and Services           Expected Discharge Date: 11/01/20                                     Social Determinants of Health (SDOH) Interventions    Readmission Risk Interventions No flowsheet data found.

## 2020-11-01 NOTE — Care Management Important Message (Signed)
Important Message  Patient Details  Name: Chelsea [name] MRN: [id number] Date of Birth: Apr 07, 1946   Medicare Important Message Given:  Yes     Shelbie Ammons, RN 11/01/2020, 1:51 PM

## 2020-11-01 NOTE — Progress Notes (Signed)
Patient ID: Chelsea [name], female   DOB: [birth year], 75 y.o.   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  Assessment & Plan:   Acute debility/generalized weakness most likely due to recent COVID-19/recent falls with rhabdomyolysis -Repeat CPK improved.  Off IV fluids. -MRI brain from previous admission did not show any evidence of acute CVA -PT/OT recommending SNF.  Social worker following.  Multilevel severe cervical spinal and foraminal stenosis: Most prominent at C4-C5 -Likely may be contributing to right upper extremity weakness. Patient has been offered surgical options in the past. She had a neurology follow-up scheduled but due to readmission, has been unable to follow-up. -MRI of the cervical spine shows severe spinal stenosis at multi level and foraminal stenosis -Neurosurgery evaluation appreciated: Patient denies surgical intervention -Continue gabapentin, lidocaine patch and as needed Tylenol/tramadol for pain control  Hypertension -Blood pressure stable.  Continue triamterene hydrochlorothiazide  Hyperlipidemia--continue statin  Mild dehydration -Resolved with IV fluids  Recent diagnosis of COVID-19 -Tested positive on 10/21/2020.  Currently asymptomatic.  Respiratory status stable and on room air.  History of anxiety and depression -Continue Prozac and as needed Xanax  Obesity -Outpatient follow-up  DVT prophylaxis: Lovenox Code Status: Full Family Communication: None Disposition Plan: Status is: Inpatient  Remains inpatient appropriate  because:Inpatient level of care appropriate due to severity of illness   Dispo:  Patient From: Chelsea [name]  Planned Disposition: [place]  Expected discharge date: Discharge to SNF as soon as bed is available  medically stable for discharge: Yes    Consultants: Neurosurgery  Procedures: None  Antimicrobials: None   Subjective: Patient seen and examined at bedside.  Poor historian.  Still complains of intermittent back and neck pain.  No overnight worsening cough or shortness of breath or fever reported. Objective: Vitals:   10/31/20 0913 10/31/20 1600 10/31/20 1949 11/01/20 0649  BP: 102/60 104/69 (!) 111/58 126/70  Pulse: 76 88 92 89  Resp: 17 16 18 16   Temp: 98.2 F (36.8 C) 98.2 F (36.8 C) 98.1 F (36.7 C) 98.1 F (36.7 C)  TempSrc: Oral Oral Oral Oral  SpO2: 100% 100% 98% 98%  Weight:      Height:        Intake/Output Summary (Last 24 hours) at 11/01/2020 0733 Last data filed at 11/01/2020 0600 Gross per 24 hour  Intake --  Output 900 ml  Net -900 ml   Filed Weights   10/28/20 1405  Weight: 80.7 kg    Examination:  General exam: No distress.  Looks chronically ill.  Poor historian.  Currently still on room air Respiratory system: Bilateral decreased breath sounds bases with no wheezing cardiovascular system: S1-S2 heard, rate controlled  gastrointestinal system: Abdomen is nondistended, soft and nontender.  Normal bowel sounds are heard  extremities: No cyanosis.  Trace lower extremity edema present bilaterally  Data Reviewed: I have personally reviewed following labs and imaging studies  CBC: Recent Labs  Lab 10/28/20 1409 10/29/20 0410 10/30/20 0627  WBC 4.6 4.8 4.8  NEUTROABS 2.0  --   --   HGB 11.4* 11.2* 10.7*  HCT 36.3 35.1* 34.8*  MCV 96.0  94.1 97.2  PLT 217 216 272   Basic Metabolic Panel: Recent Labs  Lab 10/28/20 1409 10/29/20 0410 10/30/20 0627  NA 142 140 142  K 4.0 3.6 4.1  CL 99 99 102  CO2 32  31 32  GLUCOSE 86 89 92  BUN 14 15 14   CREATININE 0.81 0.74 0.70  CALCIUM 9.3 9.3 9.0   GFR: Estimated Creatinine Clearance: 53.1 mL/min (by C-G formula based on SCr of 0.7 mg/dL). Liver Function Tests: Recent Labs  Lab 10/28/20 1409  AST 27  ALT 29  ALKPHOS 70  BILITOT 0.8  PROT 6.7  ALBUMIN 3.3*   No results for input(s): LIPASE, AMYLASE in the last 168 hours. No results for input(s): AMMONIA in the last 168 hours. Coagulation Profile: No results for input(s): INR, PROTIME in the last 168 hours. Cardiac Enzymes: Recent Labs  Lab 10/28/20 1621  CKTOTAL 160   BNP (last 3 results) No results for input(s): PROBNP in the last 8760 hours. HbA1C: No results for input(s): HGBA1C in the last 72 hours. CBG: No results for input(s): GLUCAP in the last 168 hours. Lipid Profile: No results for input(s): CHOL, HDL, LDLCALC, TRIG, CHOLHDL, LDLDIRECT in the last 72 hours. Thyroid Function Tests: No results for input(s): TSH, T4TOTAL, FREET4, T3FREE, THYROIDAB in the last 72 hours. Anemia Panel: No results for input(s): VITAMINB12, FOLATE, FERRITIN, TIBC, IRON, RETICCTPCT in the last 72 hours. Sepsis Labs: No results for input(s): PROCALCITON, LATICACIDVEN in the last 168 hours.  Recent Results (from the past 240 hour(s))  Urine Culture     Status: Abnormal   Collection Time: 10/22/20  5:24 PM   Specimen: Urine, Random  Result Value Ref Range Status   Specimen Description   Final    URINE, RANDOM Performed at [facility], [address] [place], [place] [address]    Special Requests   Final    NONE Performed at [facility], [address] [place], [place] [address]    Culture MULTIPLE SPECIES PRESENT, SUGGEST RECOLLECTION (A)  Final   Report Status 10/24/2020 FINAL  Final  MRSA PCR Screening     Status: None   Collection Time: 10/22/20  5:29 PM   Specimen: Nasopharyngeal  Result Value Ref Range Status   MRSA by PCR NEGATIVE NEGATIVE Final     Comment:        The GeneXpert MRSA Assay (FDA approved for NASAL specimens only), is one component of a comprehensive MRSA colonization surveillance program. It is not intended to diagnose MRSA infection nor to guide or monitor treatment for MRSA infections. Performed at [facility], [place], [place] [address]   SARS CORONAVIRUS 2 (TAT 6-24 HRS) Nasopharyngeal Nasopharyngeal Swab     Status: None   Collection Time: 10/31/20  3:57 PM   Specimen: Nasopharyngeal Swab  Result Value Ref Range Status   SARS Coronavirus 2 NEGATIVE NEGATIVE Final    Comment: (NOTE) SARS-CoV-2 target nucleic acids are NOT DETECTED.  The SARS-CoV-2 RNA is generally detectable in upper and lower respiratory specimens during the acute phase of infection. Negative results do not preclude SARS-CoV-2 infection, do not rule out co-infections with other pathogens, and should not be used as the sole basis for treatment or other patient management decisions. Negative results must be combined with clinical observations, patient history, and epidemiological information. The expected result is Negative.  Fact Sheet for Patients: SugarRoll.be  Fact Sheet for Healthcare Providers: https://www.woods-mathews.com/  This test is not yet  approved or cleared by the [place] and  has been authorized for detection and/or diagnosis of SARS-CoV-2 by FDA under an Emergency Use Authorization (EUA). This EUA will remain  in effect (meaning this test can be used) for the duration of the COVID-19 declaration under Se ction 564(b)(1) of the Act, 21 U.S.C. section 360bbb-3(b)(1), unless the authorization is terminated or revoked sooner.  Performed at [facility], [place] [address], [place], [place] [address]          Radiology Studies: No results found.      Scheduled Meds: . aspirin  325 mg Oral Daily  . atorvastatin  40 mg Oral  Daily  . enoxaparin (LOVENOX) injection  40 mg Subcutaneous Q24H  . FLUoxetine  20 mg Oral Daily  . gabapentin  200 mg Oral BID  . guaiFENesin  600 mg Oral BID  . lidocaine  1 patch Transdermal Q24H  . multivitamin with minerals  1 tablet Oral Daily  . triamterene-hydrochlorothiazide  1 tablet Oral Daily  . vitamin B-12  500 mcg Oral Daily   Continuous Infusions:        Aline August, MD [facility] 11/01/2020, 7:33 AM

## 2020-11-01 NOTE — Discharge Summary (Signed)
Physician Discharge Summary  Chelsea [name] [id number] DOB: 1945-12-17 DOA: 10/28/2020  PCP: Verl Bangs, FNP  Admit date: 10/28/2020 Discharge date: 11/01/2020  Admitted From: Home Disposition: SNF  Recommendations for Outpatient Follow-up:  1. Follow up with SNF provider at earliest convenience 2. Outpatient follow-up with neurosurgery 3. 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  Neurosurgery recommended surgical intervention which she declined.  PT recommended SNF placement.  She will be discharged to SNF once bed is available.  Discharge Diagnoses:   Acute debility/generalized weakness most likely due to recent COVID-19/recent falls with rhabdomyolysis -Repeat CPK improved.  Off IV fluids. -MRI brain from previous admission did not show any evidence of acute CVA -PT/OT recommending SNF.  She will be discharged to SNF once bed is available.  Multilevel severe cervical spinal and foraminal stenosis: Most prominent at C4-C5 -Likely may be contributing to right upper extremity weakness. Patient has been offered surgical options in the past. She had a neurology follow-up scheduled but due to readmission, has been unable to follow-up. -MRI of the cervical spineshows severe spinal stenosis at multi level and foraminal stenosis -Neurosurgery evaluation appreciated: Patient denied surgical intervention.  Outpatient follow-up with neurosurgery -Continue gabapentin, lidocaine patch and as needed  Tylenol/tramadol for pain control  Hypertension -Blood pressure stable.  Continue triamterene hydrochlorothiazide  Hyperlipidemia--continue statin  Mild dehydration -Resolved with IV fluids  Recent diagnosis of COVID-19 -Tested positive on 10/21/2020.  Currently asymptomatic.  Respiratory status stable and on room air. -Repeat testing done on 10/31/2020 was negative  History of anxiety and depression -Continue Prozac and as needed Xanax  Obesity -Outpatient follow-up  Discharge Instructions  Discharge Instructions    Diet - low sodium heart healthy   Complete by: As directed    Increase activity slowly   Complete by: As directed      Allergies as of 11/01/2020      Reactions   Penicillins    "Everything turned black"      Medication List    TAKE these medications   ALPRAZolam 0.5 MG tablet Commonly known as: XANAX Take 1 tablet (0.5 mg total) by mouth 3 (three) times daily as needed for anxiety.   aspirin 325 MG tablet Take 325 mg by mouth daily.   atorvastatin 40 MG tablet Commonly known as: LIPITOR Take 1 tablet (40 mg total) by mouth daily.   FLUoxetine 20 MG capsule Commonly known as: PROZAC TAKE 3 CAPSULES BY MOUTH EVERY DAY   gabapentin 100 MG capsule Commonly known as: NEURONTIN Take 2 capsules (200 mg total) by mouth 3 (three) times daily. What changed: when to take this   lidocaine 5 % Commonly known as: LIDODERM Place 1 patch onto the skin daily. Remove & Discard patch within 12 hours or as directed by MD   traMADol 50 MG tablet Commonly known as: ULTRAM Take 1 tablet (50 mg total) by mouth every 6 (six) hours as needed for moderate pain.   triamterene-hydrochlorothiazide 37.5-25 MG tablet Commonly known as: MAXZIDE-25 TAKE 1 TABLET BY MOUTH EVERY DAY   VITAMIN B 12 PO Take 500 mcg by mouth daily.  Follow-up Information    Malfi, Lupita Raider, FNP. Schedule an appointment as soon as possible for a visit in 1 week(s).    Specialty: Family Medicine Contact information: [place] [address] [phone]        Meade Maw, MD. Schedule an appointment as soon as possible for a visit in 1 week(s).   Specialty: Neurosurgery Contact information: [place] [address] [phone]              Allergies  Allergen Reactions  . Penicillins     "Everything turned black"    Consultations:  Neurosurgery   Procedures/Studies: CT Head Wo Contrast  Result Date: 10/28/2020 CLINICAL DATA:  Multiple falls.  Recent COVID infection EXAM: CT HEAD WITHOUT CONTRAST CT CERVICAL SPINE WITHOUT CONTRAST TECHNIQUE: Multidetector CT imaging of the head and cervical spine was performed following the standard protocol without intravenous contrast. Multiplanar CT image reconstructions of the cervical spine were also generated. COMPARISON:  CT head 07/08/2019 FINDINGS: CT HEAD FINDINGS Brain: Mild atrophy. Mild to moderate patchy white matter hypodensity bilaterally is chronic and unchanged. Negative for acute infarct, hemorrhage, mass Vascular: Negative for hyperdense vessel Skull: Negative Sinuses/Orbits: Paranasal sinuses clear. No orbital lesion. Right cataract extraction Other: None CT CERVICAL SPINE FINDINGS Alignment: Normal Skull base and vertebrae: Negative for fracture Soft tissues and spinal canal: Negative Disc levels: Advanced disc degeneration and spurring. 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 Moderate spinal stenosis and moderate foraminal stenosis bilaterally at C3-4. Severe spinal stenosis at C4-5 with moderate to severe foraminal encroachment bilaterally. Large right-sided osteophyte C5-6 with severe right foraminal encroachment and moderate to severe spinal stenosis. Moderate spinal stenosis at C6-7 with severe left foraminal stenosis. Upper chest: Lung apices clear bilaterally. Other: None IMPRESSION: 1. Atrophy and chronic  microvascular ischemic change in the white matter. No acute abnormality 2. Negative for cervical spine fracture 3. Advanced cervical spondylosis and O PLL. Severe multilevel spinal and foraminal stenosis as above. Electronically Signed   By: Franchot Gallo M.D.   On: 10/28/2020 16:55   CT Head Wo Contrast  Result Date: 10/21/2020 CLINICAL DATA:  Fall 2 days ago, initial encounter EXAM: CT HEAD WITHOUT CONTRAST TECHNIQUE: Contiguous axial images were obtained from the base of the skull through the vertex without intravenous contrast. COMPARISON:  07/08/2019 FINDINGS: Brain: No evidence of acute infarction, hemorrhage, hydrocephalus, extra-axial collection or mass lesion/mass effect. Mild chronic white matter ischemic changes are seen. Vascular: No hyperdense vessel or unexpected calcification. Skull: Normal. Negative for fracture or focal lesion. Sinuses/Orbits: No acute finding. Other: None. IMPRESSION: No acute intracranial abnormality noted. Electronically Signed   By: Inez Catalina M.D.   On: 10/21/2020 16:05   CT Cervical Spine Wo Contrast  Result Date: 10/28/2020 CLINICAL DATA:  Multiple falls.  Recent COVID infection EXAM: CT HEAD WITHOUT CONTRAST CT CERVICAL SPINE WITHOUT CONTRAST TECHNIQUE: Multidetector CT imaging of the head and cervical spine was performed following the standard protocol without intravenous contrast. Multiplanar CT image reconstructions of the cervical spine were also generated. COMPARISON:  CT head 07/08/2019 FINDINGS: CT HEAD FINDINGS Brain: Mild atrophy. Mild to moderate patchy white matter hypodensity bilaterally is chronic and unchanged. Negative for acute infarct, hemorrhage, mass Vascular: Negative for hyperdense vessel Skull: Negative Sinuses/Orbits: Paranasal sinuses clear. No orbital lesion. Right cataract extraction Other: None CT CERVICAL SPINE FINDINGS Alignment: Normal Skull base and vertebrae: Negative for fracture Soft tissues and spinal canal: Negative Disc levels:  Advanced  disc degeneration and spurring. 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 Moderate spinal stenosis and moderate foraminal stenosis bilaterally at C3-4. Severe spinal stenosis at C4-5 with moderate to severe foraminal encroachment bilaterally. Large right-sided osteophyte C5-6 with severe right foraminal encroachment and moderate to severe spinal stenosis. Moderate spinal stenosis at C6-7 with severe left foraminal stenosis. Upper chest: Lung apices clear bilaterally. Other: None IMPRESSION: 1. Atrophy and chronic microvascular ischemic change in the white matter. No acute abnormality 2. Negative for cervical spine fracture 3. Advanced cervical spondylosis and O PLL. Severe multilevel spinal and foraminal stenosis as above. Electronically Signed   By: Franchot Gallo M.D.   On: 10/28/2020 16:55   MR BRAIN WO CONTRAST  Result Date: 10/21/2020 CLINICAL DATA:  Initial evaluation for acute right-sided weakness. EXAM: MRI HEAD WITHOUT CONTRAST TECHNIQUE: Multiplanar, multiecho pulse sequences of the brain and surrounding structures were obtained without intravenous contrast. COMPARISON:  Prior CT from earlier same day. FINDINGS: Brain: Examination degraded by motion artifact. Cerebral volume within normal limits for age. Patchy T2/FLAIR hyperintensity within the periventricular and deep white matter both cerebral hemispheres most consistent with chronic small vessel ischemic disease, moderate in nature. Associated few scattered chronic lacunar infarcts noted at the periventricular white matter of the right frontal corona radiata. Patchy involvement of the pons noted. No abnormal foci of restricted diffusion to suggest acute or subacute ischemia. Gray-white matter differentiation maintained. No encephalomalacia to suggest chronic cortical infarction. Single punctate chronic microhemorrhage noted at the level of the left lentiform nucleus, likely small vessel related.  No other evidence for acute or chronic intracranial hemorrhage. No mass lesion, midline shift or mass effect. No hydrocephalus or extra-axial fluid collection. No appreciable intrinsic temporal lobe abnormality. Pituitary gland suprasellar region normal. Midline structures intact. Vascular: Major intracranial vascular flow voids are maintained. Skull and upper cervical spine: Craniocervical junction within normal limits. Degenerative spondylosis noted at C3-4 with suspected moderate spinal stenosis, incompletely assessed on this exam (series 9, image 13). Bone marrow signal intensity within normal limits. No scalp soft tissue abnormality. Sinuses/Orbits: Globes and orbital soft tissues demonstrate no acute finding. Patient status post ocular lens replacement on the right. Mild scattered mucosal thickening noted within the ethmoidal air cells. Paranasal sinuses are otherwise clear. No mastoid effusion. Inner ear structures grossly normal. Other: None. IMPRESSION: 1. No acute intracranial abnormality. 2. Moderate chronic microvascular ischemic disease. 3. Degenerative spondylosis at C3-4 with suspected moderate spinal stenosis, incompletely assessed on this exam. Finding could be further assessed with dedicated MRI of the cervical spine as clinically warranted. Electronically Signed   By: Jeannine Boga M.D.   On: 10/21/2020 23:12   MR Cervical Spine Wo Contrast  Result Date: 10/28/2020 CLINICAL DATA:  Myelopathy, acute or progressive. EXAM: MRI CERVICAL SPINE WITHOUT CONTRAST TECHNIQUE: Multiplanar, multisequence MR imaging of the cervical spine was performed. No intravenous contrast was administered. COMPARISON:  CT same day. FINDINGS: Alignment: Straightening of the normal cervical lordosis. Vertebrae: No fracture or primary bone lesion. Spondylosis with endplate osteophytes. Incomplete ossification of the posterior longitudinal ligament from C3-T1, better shown by CT. Cord: See below regarding stenosis.   No primary cord lesion. 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 Disc levels: Foramen magnum is widely patent. Mild osteoarthritis at the C1-2 articulation but without encroachment upon the neural structures. C2-3: Minimal disc bulge. Minimal facet arthritis. No canal or foraminal stenosis. C3-4: Severe spinal stenosis with AP diameter of  the canal in the midline only 4 mm. Cord flattening. Myelomalacia. Bilateral foraminal stenosis. C4-5: Severe spinal stenosis with AP diameter of the canal in the midline only 3.5 mm. Cord flattening. Myelomalacia. Bilateral foraminal stenosis. C5-6: Spondylosis with endplate osteophytes, OPLL and probable chronic calcified right posterolateral disc herniation. Spinal stenosis with AP diameter in the midline 6.8 mm. Cord flattening with myelomalacia. Foraminal stenosis on the right likely to compress the right C6 nerve. C6-7: Endplate osteophytes and bulging of the disc. AP diameter of the canal in the midline 8.8 mm. No cord compression. Mild foraminal narrowing on the right and moderate foraminal narrowing on the left. C7-T1: Bulging of the disc. Facet degeneration. No compressive canal or foraminal stenosis. T1-2: Disc bulge.  Facet degeneration.  No compressive stenosis. IMPRESSION: 1. 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 AP diameter of the canal in the midline only 4 mm at the C3-4 level and 3.5 mm at C4-5 level. Diameter 6.8 mm at C5-6. 2. 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 3. Bilateral foraminal stenosis at C3-4 and C4-5 and right foraminal stenosis at C5-6, all likely associated with nerve root compression. 4. Spondylosis at C6-7 with mild foraminal narrowing on the right and moderate foraminal narrowing on the left. Electronically Signed   By: Nelson Chimes M.D.   On: 10/28/2020  20:04   DG Chest Port 1 View  Result Date: 10/22/2020 CLINICAL DATA:  COVID-19, weakness EXAM: PORTABLE CHEST 1 VIEW COMPARISON:  Radiograph 07/08/2019 FINDINGS: There are chronically coarsened interstitial and bronchitic changes in the lungs. Some ill-defined patchy opacities present in the left lung base. Two small nodular opacities are present in the lungs as well in the periphery of the left lower lung in the periphery of the right mid lung, incompletely characterized on this exam. No pneumothorax. No visible effusion. No acute osseous or soft tissue abnormality. Degenerative changes are present in the imaged spine and shoulders. IMPRESSION: 1. Ill-defined patchy opacities in the left lung base could reflect atelectasis or developing infection. 2. Background of chronic interstitial and bronchitic changes. 3. Two small nodular opacities in the lungs as well as the periphery of the right mid lung, which may have been present on comparison and may reflect sequela of granulomatous disease though could be better characterized with cross-sectional imaging. Electronically Signed   By: Lovena Le M.D.   On: 10/22/2020 02:07       Subjective: Patient seen and examined at bedside.  Poor historian.  Still complains of intermittent back and neck pain.  No overnight worsening cough or shortness of breath or fever reported.  Discharge Exam: Vitals:   11/01/20 0649 11/01/20 1036  BP: 126/70 (!) 109/48  Pulse: 89 83  Resp: 16 18  Temp: 98.1 F (36.7 C) 98.1 F (36.7 C)  SpO2: 98% 99%    General exam: No distress.  Looks chronically ill.  Poor historian.  Currently still on room air Respiratory system: Bilateral decreased breath sounds bases with no wheezing cardiovascular system: S1-S2 heard, rate controlled  gastrointestinal system: Abdomen is nondistended, soft and nontender.  Normal bowel sounds are heard  extremities: No cyanosis.  Trace lower extremity edema present bilaterally   The  results of significant diagnostics from this hospitalization (including imaging, microbiology, ancillary and laboratory) are listed below for reference.     Microbiology: Recent Results (from the past 240 hour(s))  Urine Culture     Status:  Abnormal   Collection Time: 10/22/20  5:24 PM   Specimen: Urine, Random  Result Value Ref Range Status   Specimen Description   Final    URINE, RANDOM Performed at [facility], [address] [place], [place] [address]    Special Requests   Final    NONE Performed at [facility], [address] [place], [place] [address]    Culture MULTIPLE SPECIES PRESENT, SUGGEST RECOLLECTION (A)  Final   Report Status 10/24/2020 FINAL  Final  MRSA PCR Screening     Status: None   Collection Time: 10/22/20  5:29 PM   Specimen: Nasopharyngeal  Result Value Ref Range Status   MRSA by PCR NEGATIVE NEGATIVE Final    Comment:        The GeneXpert MRSA Assay (FDA approved for NASAL specimens only), is one component of a comprehensive MRSA colonization surveillance program. It is not intended to diagnose MRSA infection nor to guide or monitor treatment for MRSA infections. Performed at [facility], [place], [place] [address]   SARS CORONAVIRUS 2 (TAT 6-24 HRS) Nasopharyngeal Nasopharyngeal Swab     Status: None   Collection Time: 10/31/20  3:57 PM   Specimen: Nasopharyngeal Swab  Result Value Ref Range Status   SARS Coronavirus 2 NEGATIVE NEGATIVE Final    Comment: (NOTE) SARS-CoV-2 target nucleic acids are NOT DETECTED.  The SARS-CoV-2 RNA is generally detectable in upper and lower respiratory specimens during the acute phase of infection. Negative results do not preclude SARS-CoV-2 infection, do not rule out co-infections with other pathogens, and should not be used as the sole basis for treatment or other patient management decisions. Negative results must be combined with clinical  observations, patient history, and epidemiological information. The expected result is Negative.  Fact Sheet for Patients: SugarRoll.be  Fact Sheet for Healthcare Providers: https://www.woods-mathews.com/  This test is not yet approved or cleared by the [place] FDA and  has been authorized for detection and/or diagnosis of SARS-CoV-2 by FDA under an Emergency Use Authorization (EUA). This EUA will remain  in effect (meaning this test can be used) for the duration of the COVID-19 declaration under Se ction 564(b)(1) of the Act, 21 U.S.C. section 360bbb-3(b)(1), unless the authorization is terminated or revoked sooner.  Performed at [facility], [place] [address] [place], [place] [address]      Labs: BNP (last 3 results) Recent Labs    10/22/20 0142  BNP [id number]   Basic Metabolic Panel: Recent Labs  Lab 10/28/20 1409 10/29/20 0410 10/30/20 0627  NA 142 140 142  K 4.0 3.6 4.1  CL 99 99 102  CO2 32 31 32  GLUCOSE 86 89 92  BUN 14 15 14   CREATININE 0.81 0.74 0.70  CALCIUM 9.3 9.3 9.0   Liver Function Tests: Recent Labs  Lab 10/28/20 1409  AST 27  ALT 29  ALKPHOS 70  BILITOT 0.8  PROT 6.7  ALBUMIN 3.3*   No results for input(s): LIPASE, AMYLASE in the last 168 hours. No results for input(s): AMMONIA in the last 168 hours. CBC: Recent Labs  Lab 10/28/20 1409 10/29/20 0410 10/30/20 0627  WBC 4.6 4.8 4.8  NEUTROABS 2.0  --   --   HGB 11.4* 11.2* 10.7*  HCT 36.3 35.1* 34.8*  MCV 96.0 94.1 97.2  PLT 217 216 186   Cardiac Enzymes: Recent Labs  Lab 10/28/20 1621  CKTOTAL 160   BNP: Invalid input(s): POCBNP CBG:  No results for input(s): GLUCAP in the last 168 hours. D-Dimer No results for input(s): DDIMER in the last 72 hours. Hgb A1c No results for input(s): HGBA1C in the last 72 hours. Lipid Profile No results for input(s): CHOL, HDL, LDLCALC, TRIG, CHOLHDL, LDLDIRECT in the last 72  hours. Thyroid function studies No results for input(s): TSH, T4TOTAL, T3FREE, THYROIDAB in the last 72 hours.  Invalid input(s): FREET3 Anemia work up No results for input(s): VITAMINB12, FOLATE, FERRITIN, TIBC, IRON, RETICCTPCT in the last 72 hours. Urinalysis    Component Value Date/Time   COLORURINE YELLOW (A) 10/28/2020 1602   APPEARANCEUR CLEAR (A) 10/28/2020 1602   LABSPEC 1.014 10/28/2020 1602   PHURINE 6.0 10/28/2020 1602   GLUCOSEU NEGATIVE 10/28/2020 1602   HGBUR NEGATIVE 10/28/2020 1602   BILIRUBINUR NEGATIVE 10/28/2020 1602   BILIRUBINUR neg 08/13/2020 1112   KETONESUR NEGATIVE 10/28/2020 1602   PROTEINUR NEGATIVE 10/28/2020 1602   UROBILINOGEN 0.2 08/13/2020 1112   NITRITE NEGATIVE 10/28/2020 1602   LEUKOCYTESUR NEGATIVE 10/28/2020 1602   Sepsis Labs Invalid input(s): PROCALCITONIN,  WBC,  LACTICIDVEN Microbiology Recent Results (from the past 240 hour(s))  Urine Culture     Status: Abnormal   Collection Time: 10/22/20  5:24 PM   Specimen: Urine, Random  Result Value Ref Range Status   Specimen Description   Final    URINE, RANDOM Performed at [facility], [address] [place], [place] [address]    Special Requests   Final    NONE Performed at [facility], [address] [place], [place] [address]    Culture MULTIPLE SPECIES PRESENT, SUGGEST RECOLLECTION (A)  Final   Report Status 10/24/2020 FINAL  Final  MRSA PCR Screening     Status: None   Collection Time: 10/22/20  5:29 PM   Specimen: Nasopharyngeal  Result Value Ref Range Status   MRSA by PCR NEGATIVE NEGATIVE Final    Comment:        The GeneXpert MRSA Assay (FDA approved for NASAL specimens only), is one component of a comprehensive MRSA colonization surveillance program. It is not intended to diagnose MRSA infection nor to guide or monitor treatment for MRSA infections. Performed at [facility], [place], [place] [address]   SARS  CORONAVIRUS 2 (TAT 6-24 HRS) Nasopharyngeal Nasopharyngeal Swab     Status: None   Collection Time: 10/31/20  3:57 PM   Specimen: Nasopharyngeal Swab  Result Value Ref Range Status   SARS Coronavirus 2 NEGATIVE NEGATIVE Final    Comment: (NOTE) SARS-CoV-2 target nucleic acids are NOT DETECTED.  The SARS-CoV-2 RNA is generally detectable in upper and lower respiratory specimens during the acute phase of infection. Negative results do not preclude SARS-CoV-2 infection, do not rule out co-infections with other pathogens, and should not be used as the sole basis for treatment or other patient management decisions. Negative results must be combined with clinical observations, patient history, and epidemiological information. The expected result is Negative.  Fact Sheet for Patients: SugarRoll.be  Fact Sheet for Healthcare Providers: https://www.woods-mathews.com/  This test is not yet approved or cleared by the [place] FDA and  has been authorized for detection and/or diagnosis of SARS-CoV-2 by FDA under an Emergency Use Authorization (EUA). This EUA will remain  in effect (meaning this test can be used) for the duration of the COVID-19 declaration under Se ction 564(b)(1) of the Act, 21 U.S.C. section 360bbb-3(b)(1), unless the authorization is terminated or revoked sooner.  Performed at  [facility], [place] [address], [place], [place] [address]      Time coordinating discharge: 35 minutes  SIGNED:   Aline August, MD  [facility] 11/01/2020, 12:16 PM

## 2020-11-02 NOTE — Progress Notes (Signed)
Patient ID: Chelsea [name], female   DOB: 1945/11/14, 75 y.o.   MRN: [id number] Patient was supposed to be discharged to SNF on 11/01/2020 but this did not happen because of some insurance issues.  Patient seen and examined at bedside.  Patient is currently medically stable for discharge.  Please refer to the full discharge summary done by me on 11/01/2020 for full details.

## 2020-11-03 DIAGNOSIS — R531 Weakness: Secondary | ICD-10-CM | POA: Diagnosis not present

## 2020-11-03 DIAGNOSIS — M4802 Spinal stenosis, cervical region: Secondary | ICD-10-CM | POA: Diagnosis not present

## 2020-11-03 NOTE — Progress Notes (Signed)
Occupational Therapy Treatment Patient Details Name: Chelsea [name] MRN: [id number] DOB: 1945/12/18 Today's Date: 11/03/2020    History of present illness Chelsea [name] is a 75yo F who comes to [facility] on 1/24 after continued weakness at home since DC from hospital 4d prior, concerns about thriving at home. Pt sustained >5 falls in that time. DTR expressed concerns that pt may need a higher level of care now. PMH: HTN, HLD, urinary incontinence, GAD. sizure d/o. 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 DC 1/20 back to home at [place] c HHPT/HHOT, referral for palliative care. Last week, prior to DC pt was able to ~358ft with PT. Pt had a(+) COVID test during last admission on 10/21/20.   OT comments   Ms Sekula was seen for OT treatment on this date. Upon arrival to room pt reclined in bed agreeable to tx. Pt required MOD A exit R side of bed, MAX A for LBD at bed level. CGA + RW for ~30 ft in room mobility. MIN A + RW toileting at [facility] - MIN A perihygiene, MOD A for thoroughness. Pt making good progress toward goals. Pt continues to benefit from skilled OT services to maximize return to PLOF and minimize risk of future falls, injury, caregiver burden, and readmission. Will continue to follow POC. Discharge recommendation remains appropriate.    Follow Up Recommendations  SNF    Equipment Recommendations  Other (comment) (defer to next venue of care)    Recommendations for Other Services      Precautions / Restrictions Precautions Precautions: Fall Restrictions Weight Bearing Restrictions: No       Mobility Bed Mobility Overal bed mobility: Needs Assistance Bed Mobility: Supine to Sit;Sit to Supine     Supine to sit: Mod assist;HOB elevated Sit to supine: Min assist      Transfers Overall transfer level: Needs assistance Equipment used: Rolling walker (2 wheeled) Transfers: Sit to/from Stand Sit  to Stand: Min assist              Balance Overall balance assessment: Needs assistance Sitting-balance support: Feet supported Sitting balance-Leahy Scale: Fair     Standing balance support: Single extremity supported;During functional activity Standing balance-Leahy Scale: Fair                             ADL either performed or assessed with clinical judgement   ADL Overall ADL's : Needs assistance/impaired                                       General ADL Comments: MIN A + RW toileting at [facility] - MIN A perihygiene, MOD A for thoroughness. MAX A for LBD at bed level               Cognition Arousal/Alertness: Awake/alert Behavior During Therapy: WFL for tasks assessed/performed Overall Cognitive Status: Within Functional Limits for tasks assessed                                 General Comments: talkative        Exercises Exercises: Other exercises Other Exercises Other Exercises: Pt edcuated re: OT role, DME recs, d/c recs, falls prevention Other Exercises: LBD, toileting, sup<>sit, sit<>stand x3, sitting/standing balance/tolerance  Pertinent Vitals/ Pain       Pain Assessment: No/denies pain         Frequency  Min 1X/week        Progress Toward Goals  OT Goals(current goals can now be found in the care plan section)  Progress towards OT goals: Progressing toward goals  Acute Rehab OT Goals Patient Stated Goal: to be able to care for myself OT Goal Formulation: With patient Time For Goal Achievement: 11/12/20 Potential to Achieve Goals: Fair ADL Goals Pt Will Perform Grooming: with supervision;standing Pt Will Transfer to Toilet: with supervision;ambulating Pt Will Perform Toileting - Clothing Manipulation and hygiene: with supervision;sit to/from stand Pt Will Perform Tub/Shower Transfer: with supervision;ambulating  Plan Discharge plan remains appropriate;Frequency remains appropriate        AM-PAC OT "6 Clicks" Daily Activity     Outcome Measure   Help from another person eating meals?: A Little Help from another person taking care of personal grooming?: A Little Help from another person toileting, which includes using toliet, bedpan, or urinal?: A Little Help from another person bathing (including washing, rinsing, drying)?: A Lot Help from another person to put on and taking off regular upper body clothing?: A Little Help from another person to put on and taking off regular lower body clothing?: A Lot 6 Click Score: 16    End of Session Equipment Utilized During Treatment: Rolling walker  OT Visit Diagnosis: Unsteadiness on feet (R26.81);Repeated falls (R29.6);Muscle weakness (generalized) (M62.81)   Activity Tolerance Patient tolerated treatment well   Patient Left in bed;with call bell/phone within reach;with bed alarm set   Nurse Communication Mobility status        Time: 5701-7793 OT Time Calculation (min): 30 min  Charges: OT General Charges $OT Visit: 1 Visit OT Treatments $Self Care/Home Management : 23-37 mins  Chelsea [name], M.S. OTR/L  11/03/20, 4:14 PM  ascom [phone]

## 2020-11-03 NOTE — Discharge Summary (Signed)
Physician Discharge Summary  Chelsea [name] BWG:[id number] DOB: [birth year] DOA: 10/28/2020  PCP: Verl Bangs, FNP  Admit date: 10/28/2020 Discharge date: 11/03/2020  Admitted From: Home Disposition: SNF  Recommendations for Outpatient Follow-up:  1. Follow up with SNF provider at earliest convenience 2. Outpatient follow-up with neurosurgery 3. 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  Neurosurgery recommended surgical intervention which she declined.  PT recommended SNF placement.  She will be discharged to SNF once bed is available.  Addendum on 11/03/2020: Patient was supposed to be discharged on 10/24/2020 to SNF but discharge is still pending because of insurance issues.  Patient is currently medically stable for discharge  Discharge Diagnoses:   Acute debility/generalized weakness most likely due to recent COVID-19/recent falls with rhabdomyolysis -Repeat CPK improved.  Off IV fluids. -MRI brain from previous admission did not show any evidence of acute CVA -PT/OT recommending SNF.  She will be discharged to SNF once bed is available.  Multilevel severe cervical spinal and foraminal stenosis: Most prominent at C4-C5 -Likely may be contributing to right upper extremity weakness. Patient has been offered surgical options in the past. She had a neurology follow-up scheduled but due to readmission, has been unable to follow-up. -MRI of the cervical spineshows severe spinal stenosis at multi level and  foraminal stenosis -Neurosurgery evaluation appreciated: Patient denied surgical intervention.  Outpatient follow-up with neurosurgery -Continue gabapentin, lidocaine patch and as needed Tylenol/tramadol for pain control  Hypertension -Blood pressure stable.  Continue triamterene hydrochlorothiazide  Hyperlipidemia--continue statin  Mild dehydration -Resolved with IV fluids  Recent diagnosis of COVID-19 -Tested positive on 10/21/2020.  Currently asymptomatic.  Respiratory status stable and on room air. -Repeat testing done on 10/31/2020 was negative  History of anxiety and depression -Continue Prozac and as needed Xanax  Obesity -Outpatient follow-up  Discharge Instructions  Discharge Instructions    Diet - low sodium heart healthy   Complete by: As directed    Increase activity slowly   Complete by: As directed      Allergies as of 11/03/2020      Reactions   Penicillins    "Everything turned black"      Medication List    TAKE these medications   ALPRAZolam 0.5 MG tablet Commonly known as: XANAX Take 1 tablet (0.5 mg total) by mouth 3 (three) times daily as needed for anxiety.   aspirin 325 MG tablet Take 325 mg by mouth daily.   atorvastatin 40 MG tablet Commonly known as: LIPITOR Take 1 tablet (40 mg total) by mouth daily.   FLUoxetine 20 MG capsule Commonly known as: PROZAC TAKE 3 CAPSULES BY MOUTH EVERY DAY   gabapentin 100 MG capsule Commonly known as: NEURONTIN Take 2 capsules (200 mg total) by mouth 3 (three) times daily. What changed: when to take this   lidocaine 5 % Commonly known as: LIDODERM Place 1 patch onto the skin daily. Remove & Discard patch within 12 hours or as directed by MD   traMADol 50 MG tablet Commonly known as: ULTRAM Take 1 tablet (50 mg total) by mouth every 6 (six) hours as needed for  moderate pain.   triamterene-hydrochlorothiazide 37.5-25 MG tablet Commonly known as: MAXZIDE-25 TAKE 1 TABLET BY MOUTH EVERY  DAY   VITAMIN B 12 PO Take 500 mcg by mouth daily.       Follow-up Information    Malfi, Lupita Raider, FNP. Schedule an appointment as soon as possible for a visit in 1 week(s).   Specialty: Family Medicine Contact information: [place] [address] [phone]        Meade Maw, MD. Go on 12/05/2020.   Specialty: Neurosurgery Why: @11 :45 A.M Contact information: [address] [place]  [address] [phone]              Allergies  Allergen Reactions  . Penicillins     "Everything turned black"    Consultations:  Neurosurgery   Procedures/Studies: CT Head Wo Contrast  Result Date: 10/28/2020 CLINICAL DATA:  Multiple falls.  Recent COVID infection EXAM: CT HEAD WITHOUT CONTRAST CT CERVICAL SPINE WITHOUT CONTRAST TECHNIQUE: Multidetector CT imaging of the head and cervical spine was performed following the standard protocol without intravenous contrast. Multiplanar CT image reconstructions of the cervical spine were also generated. COMPARISON:  CT head 07/08/2019 FINDINGS: CT HEAD FINDINGS Brain: Mild atrophy. Mild to moderate patchy white matter hypodensity bilaterally is chronic and unchanged. Negative for acute infarct, hemorrhage, mass Vascular: Negative for hyperdense vessel Skull: Negative Sinuses/Orbits: Paranasal sinuses clear. No orbital lesion. Right cataract extraction Other: None CT CERVICAL SPINE FINDINGS Alignment: Normal Skull base and vertebrae: Negative for fracture Soft tissues and spinal canal: Negative Disc levels: Advanced disc degeneration and spurring. 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 Moderate spinal stenosis and moderate foraminal stenosis bilaterally at C3-4. Severe spinal stenosis at C4-5 with moderate to severe foraminal encroachment bilaterally. Large right-sided osteophyte C5-6 with severe right foraminal encroachment and moderate to severe spinal stenosis. Moderate spinal  stenosis at C6-7 with severe left foraminal stenosis. Upper chest: Lung apices clear bilaterally. Other: None IMPRESSION: 1. Atrophy and chronic microvascular ischemic change in the white matter. No acute abnormality 2. Negative for cervical spine fracture 3. Advanced cervical spondylosis and O PLL. Severe multilevel spinal and foraminal stenosis as above. Electronically Signed   By: Franchot Gallo M.D.   On: 10/28/2020 16:55   CT Head Wo Contrast  Result Date: 10/21/2020 CLINICAL DATA:  Fall 2 days ago, initial encounter EXAM: CT HEAD WITHOUT CONTRAST TECHNIQUE: Contiguous axial images were obtained from the base of the skull through the vertex without intravenous contrast. COMPARISON:  07/08/2019 FINDINGS: Brain: No evidence of acute infarction, hemorrhage, hydrocephalus, extra-axial collection or mass lesion/mass effect. Mild chronic white matter ischemic changes are seen. Vascular: No hyperdense vessel or unexpected calcification. Skull: Normal. Negative for fracture or focal lesion. Sinuses/Orbits: No acute finding. Other: None. IMPRESSION: No acute intracranial abnormality noted. Electronically Signed   By: Inez Catalina M.D.   On: 10/21/2020 16:05   CT Cervical Spine Wo Contrast  Result Date: 10/28/2020 CLINICAL DATA:  Multiple falls.  Recent COVID infection EXAM: CT HEAD WITHOUT CONTRAST CT CERVICAL SPINE WITHOUT CONTRAST TECHNIQUE: Multidetector CT imaging of the head and cervical spine was performed following the standard protocol without intravenous contrast. Multiplanar CT image reconstructions of the cervical spine were also generated. COMPARISON:  CT head 07/08/2019 FINDINGS: CT HEAD FINDINGS Brain: Mild atrophy. Mild to moderate patchy white matter hypodensity bilaterally is chronic and unchanged. Negative for acute infarct, hemorrhage, mass Vascular: Negative for hyperdense vessel Skull: Negative Sinuses/Orbits: Paranasal sinuses clear. No  orbital lesion. Right cataract extraction Other: None  CT CERVICAL SPINE FINDINGS Alignment: Normal Skull base and vertebrae: Negative for fracture Soft tissues and spinal canal: Negative Disc levels: Advanced disc degeneration and spurring. 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 Moderate spinal stenosis and moderate foraminal stenosis bilaterally at C3-4. Severe spinal stenosis at C4-5 with moderate to severe foraminal encroachment bilaterally. Large right-sided osteophyte C5-6 with severe right foraminal encroachment and moderate to severe spinal stenosis. Moderate spinal stenosis at C6-7 with severe left foraminal stenosis. Upper chest: Lung apices clear bilaterally. Other: None IMPRESSION: 1. Atrophy and chronic microvascular ischemic change in the white matter. No acute abnormality 2. Negative for cervical spine fracture 3. Advanced cervical spondylosis and O PLL. Severe multilevel spinal and foraminal stenosis as above. Electronically Signed   By: Franchot Gallo M.D.   On: 10/28/2020 16:55   MR BRAIN WO CONTRAST  Result Date: 10/21/2020 CLINICAL DATA:  Initial evaluation for acute right-sided weakness. EXAM: MRI HEAD WITHOUT CONTRAST TECHNIQUE: Multiplanar, multiecho pulse sequences of the brain and surrounding structures were obtained without intravenous contrast. COMPARISON:  Prior CT from earlier same day. FINDINGS: Brain: Examination degraded by motion artifact. Cerebral volume within normal limits for age. Patchy T2/FLAIR hyperintensity within the periventricular and deep white matter both cerebral hemispheres most consistent with chronic small vessel ischemic disease, moderate in nature. Associated few scattered chronic lacunar infarcts noted at the periventricular white matter of the right frontal corona radiata. Patchy involvement of the pons noted. No abnormal foci of restricted diffusion to suggest acute or subacute ischemia. Gray-white matter differentiation maintained. No encephalomalacia to suggest  chronic cortical infarction. Single punctate chronic microhemorrhage noted at the level of the left lentiform nucleus, likely small vessel related. No other evidence for acute or chronic intracranial hemorrhage. No mass lesion, midline shift or mass effect. No hydrocephalus or extra-axial fluid collection. No appreciable intrinsic temporal lobe abnormality. Pituitary gland suprasellar region normal. Midline structures intact. Vascular: Major intracranial vascular flow voids are maintained. Skull and upper cervical spine: Craniocervical junction within normal limits. Degenerative spondylosis noted at C3-4 with suspected moderate spinal stenosis, incompletely assessed on this exam (series 9, image 13). Bone marrow signal intensity within normal limits. No scalp soft tissue abnormality. Sinuses/Orbits: Globes and orbital soft tissues demonstrate no acute finding. Patient status post ocular lens replacement on the right. Mild scattered mucosal thickening noted within the ethmoidal air cells. Paranasal sinuses are otherwise clear. No mastoid effusion. Inner ear structures grossly normal. Other: None. IMPRESSION: 1. No acute intracranial abnormality. 2. Moderate chronic microvascular ischemic disease. 3. Degenerative spondylosis at C3-4 with suspected moderate spinal stenosis, incompletely assessed on this exam. Finding could be further assessed with dedicated MRI of the cervical spine as clinically warranted. Electronically Signed   By: Jeannine Boga M.D.   On: 10/21/2020 23:12   MR Cervical Spine Wo Contrast  Result Date: 10/28/2020 CLINICAL DATA:  Myelopathy, acute or progressive. EXAM: MRI CERVICAL SPINE WITHOUT CONTRAST TECHNIQUE: Multiplanar, multisequence MR imaging of the cervical spine was performed. No intravenous contrast was administered. COMPARISON:  CT same day. FINDINGS: Alignment: Straightening of the normal cervical lordosis. Vertebrae: No fracture or primary bone lesion. Spondylosis with  endplate osteophytes. Incomplete ossification of the posterior longitudinal ligament from C3-T1, better shown by CT. Cord: See below regarding stenosis.  No primary cord lesion. 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 Disc levels: Foramen magnum is widely patent. Mild osteoarthritis at  the C1-2 articulation but without encroachment upon the neural structures. C2-3: Minimal disc bulge. Minimal facet arthritis. No canal or foraminal stenosis. C3-4: Severe spinal stenosis with AP diameter of the canal in the midline only 4 mm. Cord flattening. Myelomalacia. Bilateral foraminal stenosis. C4-5: Severe spinal stenosis with AP diameter of the canal in the midline only 3.5 mm. Cord flattening. Myelomalacia. Bilateral foraminal stenosis. C5-6: Spondylosis with endplate osteophytes, OPLL and probable chronic calcified right posterolateral disc herniation. Spinal stenosis with AP diameter in the midline 6.8 mm. Cord flattening with myelomalacia. Foraminal stenosis on the right likely to compress the right C6 nerve. C6-7: Endplate osteophytes and bulging of the disc. AP diameter of the canal in the midline 8.8 mm. No cord compression. Mild foraminal narrowing on the right and moderate foraminal narrowing on the left. C7-T1: Bulging of the disc. Facet degeneration. No compressive canal or foraminal stenosis. T1-2: Disc bulge.  Facet degeneration.  No compressive stenosis. IMPRESSION: 1. 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 AP diameter of the canal in the midline only 4 mm at the C3-4 level and 3.5 mm at C4-5 level. Diameter 6.8 mm at C5-6. 2. 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 3. Bilateral foraminal stenosis at C3-4 and C4-5 and right foraminal stenosis at C5-6, all likely associated with nerve root compression. 4. Spondylosis at C6-7  with mild foraminal narrowing on the right and moderate foraminal narrowing on the left. Electronically Signed   By: Nelson Chimes M.D.   On: 10/28/2020 20:04   DG Chest Port 1 View  Result Date: 10/22/2020 CLINICAL DATA:  COVID-19, weakness EXAM: PORTABLE CHEST 1 VIEW COMPARISON:  Radiograph 07/08/2019 FINDINGS: There are chronically coarsened interstitial and bronchitic changes in the lungs. Some ill-defined patchy opacities present in the left lung base. Two small nodular opacities are present in the lungs as well in the periphery of the left lower lung in the periphery of the right mid lung, incompletely characterized on this exam. No pneumothorax. No visible effusion. No acute osseous or soft tissue abnormality. Degenerative changes are present in the imaged spine and shoulders. IMPRESSION: 1. Ill-defined patchy opacities in the left lung base could reflect atelectasis or developing infection. 2. Background of chronic interstitial and bronchitic changes. 3. Two small nodular opacities in the lungs as well as the periphery of the right mid lung, which may have been present on comparison and may reflect sequela of granulomatous disease though could be better characterized with cross-sectional imaging. Electronically Signed   By: Lovena Le M.D.   On: 10/22/2020 02:07      Subjective: Patient seen and examined at bedside.   No overnight fever, worsening shortness breath reported.  Poor historian. Discharge Exam: Vitals:   11/03/20 0521 11/03/20 0752  BP: 123/63 112/61  Pulse: 82 75  Resp: 16 16  Temp: 98 F (36.7 C) 97.7 F (36.5 C)  SpO2: 95% 96%    General exam:  No acute distress. Looks chronically ill.    Sleepy, wakes up slightly, hardly perseverates in conversation.  Currently on room air  respiratory system:  Decreased breath sounds at bases bilaterally  cardiovascular system:  Rate controlled, S1-S2 heard gastrointestinal system: Abdomen is nondistended, soft and nontender.     Bowel sounds heard  extremities:  Mild extremity edema present.  No clubbing   The results of significant diagnostics from this hospitalization (including imaging, microbiology, ancillary and laboratory) are listed  below for reference.     Microbiology: Recent Results (from the past 240 hour(s))  SARS CORONAVIRUS 2 (TAT 6-24 HRS) Nasopharyngeal Nasopharyngeal Swab     Status: None   Collection Time: 10/31/20  3:57 PM   Specimen: Nasopharyngeal Swab  Result Value Ref Range Status   SARS Coronavirus 2 NEGATIVE NEGATIVE Final    Comment: (NOTE) SARS-CoV-2 target nucleic acids are NOT DETECTED.  The SARS-CoV-2 RNA is generally detectable in upper and lower respiratory specimens during the acute phase of infection. Negative results do not preclude SARS-CoV-2 infection, do not rule out co-infections with other pathogens, and should not be used as the sole basis for treatment or other patient management decisions. Negative results must be combined with clinical observations, patient history, and epidemiological information. The expected result is Negative.  Fact Sheet for Patients: SugarRoll.be  Fact Sheet for Healthcare Providers: https://www.woods-mathews.com/  This test is not yet approved or cleared by the [place] FDA and  has been authorized for detection and/or diagnosis of SARS-CoV-2 by FDA under an Emergency Use Authorization (EUA). This EUA will remain  in effect (meaning this test can be used) for the duration of the COVID-19 declaration under Se ction 564(b)(1) of the Act, 21 U.S.C. section 360bbb-3(b)(1), unless the authorization is terminated or revoked sooner.  Performed at [facility], [place] [address] [place], [place] [address]      Labs: BNP (last 3 results) Recent Labs    10/22/20 0142  BNP [id number]   Basic Metabolic Panel: Recent Labs  Lab 10/28/20 1409 10/29/20 0410 10/30/20 0627  NA 142 140 142   K 4.0 3.6 4.1  CL 99 99 102  CO2 32 31 32  GLUCOSE 86 89 92  BUN 14 15 14   CREATININE 0.81 0.74 0.70  CALCIUM 9.3 9.3 9.0   Liver Function Tests: Recent Labs  Lab 10/28/20 1409  AST 27  ALT 29  ALKPHOS 70  BILITOT 0.8  PROT 6.7  ALBUMIN 3.3*   No results for input(s): LIPASE, AMYLASE in the last 168 hours. No results for input(s): AMMONIA in the last 168 hours. CBC: Recent Labs  Lab 10/28/20 1409 10/29/20 0410 10/30/20 0627  WBC 4.6 4.8 4.8  NEUTROABS 2.0  --   --   HGB 11.4* 11.2* 10.7*  HCT 36.3 35.1* 34.8*  MCV 96.0 94.1 97.2  PLT 217 216 186   Cardiac Enzymes: Recent Labs  Lab 10/28/20 1621  CKTOTAL 160   BNP: Invalid input(s): POCBNP CBG: No results for input(s): GLUCAP in the last 168 hours. D-Dimer No results for input(s): DDIMER in the last 72 hours. Hgb A1c No results for input(s): HGBA1C in the last 72 hours. Lipid Profile No results for input(s): CHOL, HDL, LDLCALC, TRIG, CHOLHDL, LDLDIRECT in the last 72 hours. Thyroid function studies No results for input(s): TSH, T4TOTAL, T3FREE, THYROIDAB in the last 72 hours.  Invalid input(s): FREET3 Anemia work up No results for input(s): VITAMINB12, FOLATE, FERRITIN, TIBC, IRON, RETICCTPCT in the last 72 hours. Urinalysis    Component Value Date/Time   COLORURINE YELLOW (A) 10/28/2020 1602   APPEARANCEUR CLEAR (A) 10/28/2020 1602   LABSPEC 1.014 10/28/2020 1602   PHURINE 6.0 10/28/2020 1602   GLUCOSEU NEGATIVE 10/28/2020 1602   HGBUR NEGATIVE 10/28/2020 1602   BILIRUBINUR NEGATIVE 10/28/2020 1602   BILIRUBINUR neg 08/13/2020 [place] 10/28/2020 1602   PROTEINUR NEGATIVE 10/28/2020 1602   UROBILINOGEN 0.2 08/13/2020 1112   NITRITE NEGATIVE 10/28/2020 1602  LEUKOCYTESUR NEGATIVE 10/28/2020 1602   Sepsis Labs Invalid input(s): PROCALCITONIN,  WBC,  LACTICIDVEN Microbiology Recent Results (from the past 240 hour(s))  SARS CORONAVIRUS 2 (TAT 6-24 HRS) Nasopharyngeal  Nasopharyngeal Swab     Status: None   Collection Time: 10/31/20  3:57 PM   Specimen: Nasopharyngeal Swab  Result Value Ref Range Status   SARS Coronavirus 2 NEGATIVE NEGATIVE Final    Comment: (NOTE) SARS-CoV-2 target nucleic acids are NOT DETECTED.  The SARS-CoV-2 RNA is generally detectable in upper and lower respiratory specimens during the acute phase of infection. Negative results do not preclude SARS-CoV-2 infection, do not rule out co-infections with other pathogens, and should not be used as the sole basis for treatment or other patient management decisions. Negative results must be combined with clinical observations, patient history, and epidemiological information. The expected result is Negative.  Fact Sheet for Patients: SugarRoll.be  Fact Sheet for Healthcare Providers: https://www.woods-mathews.com/  This test is not yet approved or cleared by the [place] FDA and  has been authorized for detection and/or diagnosis of SARS-CoV-2 by FDA under an Emergency Use Authorization (EUA). This EUA will remain  in effect (meaning this test can be used) for the duration of the COVID-19 declaration under Se ction 564(b)(1) of the Act, 21 U.S.C. section 360bbb-3(b)(1), unless the authorization is terminated or revoked sooner.  Performed at [facility], [place] [address], [place], [place] [address]      Time coordinating discharge: 35 minutes  SIGNED:   Aline August, MD  [facility] 11/03/2020, 7:57 AM

## 2020-11-04 LAB — CREATININE, SERUM
Creatinine, Ser: 0.86 mg/dL (ref 0.44–1.00)
GFR, Estimated: >60 mL/min (ref >60)

## 2020-11-04 NOTE — Care Management Important Message (Signed)
Important Message  Patient Details  Name: Chelsea [name] MRN: [id number] Date of Birth: [birth year]   Medicare Important Message Given:  Yes     Juliann Pulse A Oluwatosin Higginson 11/04/2020, 11:13 AM

## 2020-11-04 NOTE — TOC Progression Note (Signed)
Transition of Care [facility]) - Progression Note    Patient Details  Name: Chelsea [name] MRN: [id number] Date of Birth: Sep 26, 1946  Transition of Care [facility]) CM/SW [place], RN Phone Number: 11/04/2020, 11:15 AM  Clinical Narrative:   RNCM met with patient at bedside and spoke with patient's daughter [name] by telephone. [name] reports that she is actively working on Print production planner claim taken care of. Did discuss that patient is medically clear to leave the hospital. [name] reports that she will work on a plan and get back with this RNCM as soon as possible.          Expected Discharge Plan and Services           Expected Discharge Date: 11/01/20                                     Social Determinants of Health (SDOH) Interventions    Readmission Risk Interventions No flowsheet data found.

## 2020-11-04 NOTE — TOC Progression Note (Signed)
Transition of Care [facility]) - Progression Note    Patient Details  Name: Chelsea [name] MRN: [id number] Date of Birth: 03-20-1946  Transition of Care [facility]) CM/SW [place], RN Phone Number: 11/04/2020, 2:42 PM  Clinical Narrative:   RNCM received return call from patient's daughter Haydee Salter reports that she has been able to arrange someone to stay with her mother around the clock but it is a relative coming from out of town and they will not be able to be here until tomorrow. RNCM notified MD/nurse of updated disposition plan.          Expected Discharge Plan and Services           Expected Discharge Date: 11/01/20                                     Social Determinants of Health (SDOH) Interventions    Readmission Risk Interventions No flowsheet data found.

## 2020-11-04 NOTE — Progress Notes (Signed)
Patient ID: Chelsea [name], female   DOB: March 22, 1946, 75 y.o.   MRN: [id number] Patient was supposed to be discharged to SNF on 11/01/2020 but this did not happen because of some insurance issues.  Patient seen and examined at bedside.  Patient is currently medically stable for discharge.  Please refer to the full discharge summary done by me on 11/03/2020 for full details.

## 2020-11-04 NOTE — Progress Notes (Addendum)
Physical Therapy Treatment Patient Details Name: Chelsea [name] MRN: [id number] DOB: 12-06-1945 Today's Date: 11/04/2020    History of Present Illness Chelsea [name] is a 75yo F who comes to [facility] on 1/24 after continued weakness at home since DC from hospital 4d prior, concerns about thriving at home. Pt sustained >5 falls in that time. DTR expressed concerns that pt may need a higher level of care now. PMH: HTN, HLD, urinary incontinence, GAD. sizure d/o. 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 DC 1/20 back to home at [facility] HHPT/HHOT, referral for palliative care. Last week, prior to DC pt was able to ~320ft with PT. Pt had a(+) COVID test during last admission on 10/21/20.    PT Comments    Pt is making gradual progress towards goals. Pt unsteady during sit<>stand transfers and demonstrates post leaning. Also demonstrates continued B LE weakness/often with associated B foot numbness that she contributes to her spinal stenosis. Decreased safety awareness. Good endurance with there-ex. Will continue to progress as able.   Follow Up Recommendations  SNF     Equipment Recommendations  None recommended by PT;Other (comment)    Recommendations for Other Services       Precautions / Restrictions Precautions Precautions: Fall Restrictions Weight Bearing Restrictions: No    Mobility  Bed Mobility               General bed mobility comments: not performed, received in recliner  Transfers Overall transfer level: Needs assistance Equipment used: Rolling walker (2 wheeled) Transfers: Sit to/from Stand Sit to Stand: Min assist         General transfer comment: post lean with transfer, improves with repeat reps.  Ambulation/Gait Ambulation/Gait assistance: Min assist Gait Distance (Feet): 10 Feet Assistive device: Rolling walker (2 wheeled) Gait Pattern/deviations: Step-to pattern;Decreased  step length - right;Decreased step length - left;Shuffle;Trunk flexed     General Gait Details: pt ambulated with off balance gait, post lean and quick fatigue. Needs hands on assist to prevent fall   Stairs             Wheelchair Mobility    Modified Rankin (Stroke Patients Only)       Balance Overall balance assessment: Needs assistance Sitting-balance support: Feet supported Sitting balance-Leahy Scale: Fair     Standing balance support: Bilateral upper extremity supported Standing balance-Leahy Scale: Poor                              Cognition Arousal/Alertness: Awake/alert Behavior During Therapy: WFL for tasks assessed/performed Overall Cognitive Status: Within Functional Limits for tasks assessed                                        Exercises Other Exercises Other Exercises: Seated/standing ther-ex performed x 10 reps including seated/standing marching, LAQ, modified sit ups.    General Comments        Pertinent Vitals/Pain Pain Assessment: No/denies pain    Home Living                      Prior Function            PT Goals (current goals can now be found in the care plan section) Acute Rehab PT Goals Patient Stated Goal:  to be able to care for myself PT Goal Formulation: With patient Time For Goal Achievement: 11/12/20 Potential to Achieve Goals: Fair Progress towards PT goals: Progressing toward goals    Frequency    Min 2X/week      PT Plan Current plan remains appropriate    Co-evaluation              AM-PAC PT "6 Clicks" Mobility   Outcome Measure  Help needed turning from your back to your side while in a flat bed without using bedrails?: A Little Help needed moving from lying on your back to sitting on the side of a flat bed without using bedrails?: A Lot Help needed moving to and from a bed to a chair (including a wheelchair)?: A Lot Help needed standing up from a chair using  your arms (e.g., wheelchair or bedside chair)?: A Little Help needed to walk in hospital room?: A Lot Help needed climbing 3-5 steps with a railing? : Total 6 Click Score: 13    End of Session Equipment Utilized During Treatment: Gait belt Activity Tolerance: Patient tolerated treatment well;Patient limited by fatigue Patient left: in chair;with chair alarm set Nurse Communication: Mobility status PT Visit Diagnosis: Muscle weakness (generalized) (M62.81);Difficulty in walking, not elsewhere classified (R26.2);Pain Pain - Right/Left: Right     Time: 9373-4287 PT Time Calculation (min) (ACUTE ONLY): 25 min  Charges:  $Gait Training: 8-22 mins $Therapeutic Exercise: 8-22 mins                     Greggory Stallion, PT, DPT [phone]    Syrah Daughtrey 11/04/2020, 12:00 PM

## 2020-11-05 NOTE — Discharge Summary (Signed)
Physician Discharge Summary  Chelsea [name] [id number] DOB: 1946-05-08 DOA: 10/28/2020  PCP: Verl Bangs, FNP  Admit date: 10/28/2020 Discharge date: 11/05/2020  Admitted From: ALF Disposition: ALF  Recommendations for Outpatient Follow-up:  1. Follow up with PCP in a week  2. outpatient follow-up with neurosurgery 3. 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  Neurosurgery recommended surgical intervention which she declined.  PT recommended SNF placement.    Addendum on 11/03/2020: Patient was supposed to be discharged on 11/01/2020 to SNF but discharge is still pending because of insurance issues.  Patient is currently medically stable for discharge  Addendum on 11/05/2020 Patient could not be discharged to SNF because of insurance issues.  Patient will now be discharged back to ALF with home health PT/OT  Discharge Diagnoses:   Acute debility/generalized weakness most likely due to recent COVID-19/recent falls with rhabdomyolysis -Repeat CPK improved.  Off IV fluids. -MRI brain from previous admission did not show any evidence of acute CVA -PT/OT recommending SNF.  Patient could not be discharged to SNF because of insurance issues.  Patient will now be discharged back to ALF with home health PT/OT.  Multilevel severe cervical spinal and foraminal stenosis: Most prominent at C4-C5 -Likely may be contributing to right upper extremity weakness. Patient has been offered surgical options in the past.  She had a neurology follow-up scheduled but due to readmission, has been unable to follow-up. -MRI of the cervical spineshows severe spinal stenosis at multi level and foraminal stenosis -Neurosurgery evaluation appreciated: Patient denied surgical intervention.  Outpatient follow-up with neurosurgery -Continue gabapentin, lidocaine patch and as needed Tylenol/tramadol for pain control  Hypertension -Blood pressure stable.  Continue triamterene hydrochlorothiazide  Hyperlipidemia--continue statin  Mild dehydration -Resolved with IV fluids  Recent diagnosis of COVID-19 -Tested positive on 10/21/2020.  Currently asymptomatic.  Respiratory status stable and on room air. -Repeat testing done on 10/31/2020 was negative  History of anxiety and depression -Continue Prozac and as needed Xanax  Obesity -Outpatient follow-up  Discharge Instructions  Discharge Instructions    Diet - low sodium heart healthy   Complete by: As directed    Increase activity slowly   Complete by: As directed      Allergies as of 11/05/2020      Reactions   Penicillins    "Everything turned black"      Medication List    TAKE these medications   ALPRAZolam 0.5 MG tablet Commonly known as: XANAX Take 1 tablet (0.5 mg total) by mouth 3 (three) times daily as needed for anxiety.   aspirin 325 MG tablet Take 325 mg by mouth daily.   atorvastatin 40 MG tablet Commonly known as: LIPITOR Take 1 tablet (40 mg total) by mouth daily.   FLUoxetine 20 MG capsule Commonly known as: PROZAC TAKE 3 CAPSULES BY MOUTH EVERY DAY   gabapentin 100 MG capsule Commonly known as: NEURONTIN Take 2 capsules (200 mg total) by mouth 3 (three) times daily. What changed: when to take this   lidocaine 5 % Commonly known as: LIDODERM Place 1 patch onto the skin daily. Remove & Discard patch  within 12 hours or as directed by MD   traMADol 50 MG tablet Commonly known as: ULTRAM Take 1 tablet (50 mg total) by  mouth every 6 (six) hours as needed for moderate pain.   triamterene-hydrochlorothiazide 37.5-25 MG tablet Commonly known as: MAXZIDE-25 TAKE 1 TABLET BY MOUTH EVERY DAY   VITAMIN B 12 PO Take 500 mcg by mouth daily.       Follow-up Information    Malfi, Lupita Raider, FNP. Schedule an appointment as soon as possible for a visit in 1 week.   Specialty: Family Medicine Why: Patient to schedule Appt Contact information: [place] [address] [phone]        Meade Maw, MD. Go on 12/05/2020.   Specialty: Neurosurgery Why: @11 :45 A.M Contact information: [address] [place] [address] [phone]              Allergies  Allergen Reactions  . Penicillins     "Everything turned black"    Consultations:  Neurosurgery   Procedures/Studies: CT Head Wo Contrast  Result Date: 10/28/2020 CLINICAL DATA:  Multiple falls.  Recent COVID infection EXAM: CT HEAD WITHOUT CONTRAST CT CERVICAL SPINE WITHOUT CONTRAST TECHNIQUE: Multidetector CT imaging of the head and cervical spine was performed following the standard protocol without intravenous contrast. Multiplanar CT image reconstructions of the cervical spine were also generated. COMPARISON:  CT head 07/08/2019 FINDINGS: CT HEAD FINDINGS Brain: Mild atrophy. Mild to moderate patchy white matter hypodensity bilaterally is chronic and unchanged. Negative for acute infarct, hemorrhage, mass Vascular: Negative for hyperdense vessel Skull: Negative Sinuses/Orbits: Paranasal sinuses clear. No orbital lesion. Right cataract extraction Other: None CT CERVICAL SPINE FINDINGS Alignment: Normal Skull base and vertebrae: Negative for fracture Soft tissues and spinal canal: Negative Disc levels: Advanced disc degeneration and spurring. 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 Moderate spinal stenosis and moderate foraminal stenosis bilaterally at C3-4. Severe spinal stenosis  at C4-5 with moderate to severe foraminal encroachment bilaterally. Large right-sided osteophyte C5-6 with severe right foraminal encroachment and moderate to severe spinal stenosis. Moderate spinal stenosis at C6-7 with severe left foraminal stenosis. Upper chest: Lung apices clear bilaterally. Other: None IMPRESSION: 1. Atrophy and chronic microvascular ischemic change in the white matter. No acute abnormality 2. Negative for cervical spine fracture 3. Advanced cervical spondylosis and O PLL. Severe multilevel spinal and foraminal stenosis as above. Electronically Signed   By: Franchot Gallo M.D.   On: 10/28/2020 16:55   CT Head Wo Contrast  Result Date: 10/21/2020 CLINICAL DATA:  Fall 2 days ago, initial encounter EXAM: CT HEAD WITHOUT CONTRAST TECHNIQUE: Contiguous axial images were obtained from the base of the skull through the vertex without intravenous contrast. COMPARISON:  07/08/2019 FINDINGS: Brain: No evidence of acute infarction, hemorrhage, hydrocephalus, extra-axial collection or mass lesion/mass effect. Mild chronic white matter ischemic changes are seen. Vascular: No hyperdense vessel or unexpected calcification. Skull: Normal. Negative for fracture or focal lesion. Sinuses/Orbits: No acute finding. Other: None. IMPRESSION: No acute intracranial abnormality noted. Electronically Signed   By: Inez Catalina M.D.   On: 10/21/2020 16:05   CT Cervical Spine Wo Contrast  Result Date: 10/28/2020 CLINICAL DATA:  Multiple falls.  Recent COVID infection EXAM: CT HEAD WITHOUT CONTRAST CT CERVICAL SPINE WITHOUT CONTRAST TECHNIQUE: Multidetector CT imaging of the head and cervical spine was performed following the standard protocol without intravenous contrast. Multiplanar CT image reconstructions of the cervical spine were also generated. COMPARISON:  CT head  07/08/2019 FINDINGS: CT HEAD FINDINGS Brain: Mild atrophy. Mild to moderate patchy white matter hypodensity bilaterally is chronic and unchanged.  Negative for acute infarct, hemorrhage, mass Vascular: Negative for hyperdense vessel Skull: Negative Sinuses/Orbits: Paranasal sinuses clear. No orbital lesion. Right cataract extraction Other: None CT CERVICAL SPINE FINDINGS Alignment: Normal Skull base and vertebrae: Negative for fracture Soft tissues and spinal canal: Negative Disc levels: Advanced disc degeneration and spurring. 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 Moderate spinal stenosis and moderate foraminal stenosis bilaterally at C3-4. Severe spinal stenosis at C4-5 with moderate to severe foraminal encroachment bilaterally. Large right-sided osteophyte C5-6 with severe right foraminal encroachment and moderate to severe spinal stenosis. Moderate spinal stenosis at C6-7 with severe left foraminal stenosis. Upper chest: Lung apices clear bilaterally. Other: None IMPRESSION: 1. Atrophy and chronic microvascular ischemic change in the white matter. No acute abnormality 2. Negative for cervical spine fracture 3. Advanced cervical spondylosis and O PLL. Severe multilevel spinal and foraminal stenosis as above. Electronically Signed   By: Franchot Gallo M.D.   On: 10/28/2020 16:55   MR BRAIN WO CONTRAST  Result Date: 10/21/2020 CLINICAL DATA:  Initial evaluation for acute right-sided weakness. EXAM: MRI HEAD WITHOUT CONTRAST TECHNIQUE: Multiplanar, multiecho pulse sequences of the brain and surrounding structures were obtained without intravenous contrast. COMPARISON:  Prior CT from earlier same day. FINDINGS: Brain: Examination degraded by motion artifact. Cerebral volume within normal limits for age. Patchy T2/FLAIR hyperintensity within the periventricular and deep white matter both cerebral hemispheres most consistent with chronic small vessel ischemic disease, moderate in nature. Associated few scattered chronic lacunar infarcts noted at the periventricular white matter of the right frontal corona  radiata. Patchy involvement of the pons noted. No abnormal foci of restricted diffusion to suggest acute or subacute ischemia. Gray-white matter differentiation maintained. No encephalomalacia to suggest chronic cortical infarction. Single punctate chronic microhemorrhage noted at the level of the left lentiform nucleus, likely small vessel related. No other evidence for acute or chronic intracranial hemorrhage. No mass lesion, midline shift or mass effect. No hydrocephalus or extra-axial fluid collection. No appreciable intrinsic temporal lobe abnormality. Pituitary gland suprasellar region normal. Midline structures intact. Vascular: Major intracranial vascular flow voids are maintained. Skull and upper cervical spine: Craniocervical junction within normal limits. Degenerative spondylosis noted at C3-4 with suspected moderate spinal stenosis, incompletely assessed on this exam (series 9, image 13). Bone marrow signal intensity within normal limits. No scalp soft tissue abnormality. Sinuses/Orbits: Globes and orbital soft tissues demonstrate no acute finding. Patient status post ocular lens replacement on the right. Mild scattered mucosal thickening noted within the ethmoidal air cells. Paranasal sinuses are otherwise clear. No mastoid effusion. Inner ear structures grossly normal. Other: None. IMPRESSION: 1. No acute intracranial abnormality. 2. Moderate chronic microvascular ischemic disease. 3. Degenerative spondylosis at C3-4 with suspected moderate spinal stenosis, incompletely assessed on this exam. Finding could be further assessed with dedicated MRI of the cervical spine as clinically warranted. Electronically Signed   By: Jeannine Boga M.D.   On: 10/21/2020 23:12   MR Cervical Spine Wo Contrast  Result Date: 10/28/2020 CLINICAL DATA:  Myelopathy, acute or progressive. EXAM: MRI CERVICAL SPINE WITHOUT CONTRAST TECHNIQUE: Multiplanar, multisequence MR imaging of the cervical spine was performed.  No intravenous contrast was administered. COMPARISON:  CT same day. FINDINGS: Alignment: Straightening of the normal cervical lordosis. Vertebrae: No fracture or primary bone lesion. Spondylosis with endplate osteophytes. Incomplete ossification of the posterior longitudinal ligament from C3-T1, better shown  by CT. Cord: See below regarding stenosis.  No primary cord lesion. 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 Disc levels: Foramen magnum is widely patent. Mild osteoarthritis at the C1-2 articulation but without encroachment upon the neural structures. C2-3: Minimal disc bulge. Minimal facet arthritis. No canal or foraminal stenosis. C3-4: Severe spinal stenosis with AP diameter of the canal in the midline only 4 mm. Cord flattening. Myelomalacia. Bilateral foraminal stenosis. C4-5: Severe spinal stenosis with AP diameter of the canal in the midline only 3.5 mm. Cord flattening. Myelomalacia. Bilateral foraminal stenosis. C5-6: Spondylosis with endplate osteophytes, OPLL and probable chronic calcified right posterolateral disc herniation. Spinal stenosis with AP diameter in the midline 6.8 mm. Cord flattening with myelomalacia. Foraminal stenosis on the right likely to compress the right C6 nerve. C6-7: Endplate osteophytes and bulging of the disc. AP diameter of the canal in the midline 8.8 mm. No cord compression. Mild foraminal narrowing on the right and moderate foraminal narrowing on the left. C7-T1: Bulging of the disc. Facet degeneration. No compressive canal or foraminal stenosis. T1-2: Disc bulge.  Facet degeneration.  No compressive stenosis. IMPRESSION: 1. 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 AP diameter of the canal in the midline only 4 mm at the C3-4 level and 3.5 mm at C4-5 level. Diameter 6.8 mm at C5-6. 2. 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 3. Bilateral foraminal stenosis at C3-4 and C4-5 and right foraminal stenosis at C5-6, all likely associated with nerve root compression. 4. Spondylosis at C6-7 with mild foraminal narrowing on the right and moderate foraminal narrowing on the left. Electronically Signed   By: Nelson Chimes M.D.   On: 10/28/2020 20:04   DG Chest Port 1 View  Result Date: 10/22/2020 CLINICAL DATA:  COVID-19, weakness EXAM: PORTABLE CHEST 1 VIEW COMPARISON:  Radiograph 07/08/2019 FINDINGS: There are chronically coarsened interstitial and bronchitic changes in the lungs. Some ill-defined patchy opacities present in the left lung base. Two small nodular opacities are present in the lungs as well in the periphery of the left lower lung in the periphery of the right mid lung, incompletely characterized on this exam. No pneumothorax. No visible effusion. No acute osseous or soft tissue abnormality. Degenerative changes are present in the imaged spine and shoulders. IMPRESSION: 1. Ill-defined patchy opacities in the left lung base could reflect atelectasis or developing infection. 2. Background of chronic interstitial and bronchitic changes. 3. Two small nodular opacities in the lungs as well as the periphery of the right mid lung, which may have been present on comparison and may reflect sequela of granulomatous disease though could be better characterized with cross-sectional imaging. Electronically Signed   By: Lovena Le M.D.   On: 10/22/2020 02:07      Subjective: Patient seen and examined at bedside.   Poor historian.  Sleepy, wakes up slightly, hardly answers any questions.  No overnight fever or vomiting reported. Discharge Exam: Vitals:   11/05/20 0001 11/05/20 0357  BP: 125/64 111/66  Pulse: 81 74  Resp: 20 18  Temp: 98.7 F (37.1 C) (!) 97.5 F (36.4 C)  SpO2: 98% 98%    General exam:  No distress.  Chronically ill looking.  Poor historian.  Currently on room air.   Sleepy, wakes up slightly, hardly answers any questions. Respiratory system:  Bilateral decreased sounds at bases with some scattered crackles  cardiovascular system:  S1-S2 heard, rate controlled gastrointestinal system: Abdomen is nondistended, soft and nontender.  Normal bowel sounds are heard  extremities:  No cyanosis.  Trace lower extremity edema present   The results of significant diagnostics from this hospitalization (including imaging, microbiology, ancillary and laboratory) are listed below for reference.     Microbiology: Recent Results (from the past 240 hour(s))  SARS CORONAVIRUS 2 (TAT 6-24 HRS) Nasopharyngeal Nasopharyngeal Swab     Status: None   Collection Time: 10/31/20  3:57 PM   Specimen: Nasopharyngeal Swab  Result Value Ref Range Status   SARS Coronavirus 2 NEGATIVE NEGATIVE Final    Comment: (NOTE) SARS-CoV-2 target nucleic acids are NOT DETECTED.  The SARS-CoV-2 RNA is generally detectable in upper and lower respiratory specimens during the acute phase of infection. Negative results do not preclude SARS-CoV-2 infection, do not rule out co-infections with other pathogens, and should not be used as the sole basis for treatment or other patient management decisions. Negative results must be combined with clinical observations, patient history, and epidemiological information. The expected result is Negative.  Fact Sheet for Patients: SugarRoll.be  Fact Sheet for Healthcare Providers: https://www.woods-mathews.com/  This test is not yet approved or cleared by the [place] FDA and  has been authorized for detection and/or diagnosis of SARS-CoV-2 by FDA under an Emergency Use Authorization (EUA). This EUA will remain  in effect (meaning this test can be used) for the duration of the COVID-19 declaration under Se ction 564(b)(1) of the Act, 21 U.S.C. section 360bbb-3(b)(1), unless the authorization is terminated  or revoked sooner.  Performed at [facility], [place] [address] [place], [place] [address]      Labs: BNP (last 3 results) Recent Labs    10/22/20 0142  BNP 29.9   Basic Metabolic Panel: Recent Labs  Lab 10/30/20 0627 11/04/20 0427  NA 142  --   K 4.1  --   CL 102  --   CO2 32  --   GLUCOSE 92  --   BUN 14  --   CREATININE 0.70 0.86  CALCIUM 9.0  --    Liver Function Tests: No results for input(s): AST, ALT, ALKPHOS, BILITOT, PROT, ALBUMIN in the last 168 hours. No results for input(s): LIPASE, AMYLASE in the last 168 hours. No results for input(s): AMMONIA in the last 168 hours. CBC: Recent Labs  Lab 10/30/20 0627  WBC 4.8  HGB 10.7*  HCT 34.8*  MCV 97.2  PLT 186   Cardiac Enzymes: No results for input(s): CKTOTAL, CKMB, CKMBINDEX, TROPONINI in the last 168 hours. BNP: Invalid input(s): POCBNP CBG: No results for input(s): GLUCAP in the last 168 hours. D-Dimer No results for input(s): DDIMER in the last 72 hours. Hgb A1c No results for input(s): HGBA1C in the last 72 hours. Lipid Profile No results for input(s): CHOL, HDL, LDLCALC, TRIG, CHOLHDL, LDLDIRECT in the last 72 hours. Thyroid function studies No results for input(s): TSH, T4TOTAL, T3FREE, THYROIDAB in the last 72 hours.  Invalid input(s): FREET3 Anemia work up No results for input(s): VITAMINB12, FOLATE, FERRITIN, TIBC, IRON, RETICCTPCT in the last 72 hours. Urinalysis    Component Value Date/Time   COLORURINE YELLOW (A) 10/28/2020 1602   APPEARANCEUR CLEAR (A) 10/28/2020 1602   LABSPEC 1.014 10/28/2020 1602   PHURINE 6.0 10/28/2020 1602   GLUCOSEU NEGATIVE 10/28/2020 1602   HGBUR NEGATIVE 10/28/2020 1602   BILIRUBINUR NEGATIVE 10/28/2020 1602   BILIRUBINUR neg 08/13/2020 1112   KETONESUR  NEGATIVE 10/28/2020 1602   PROTEINUR NEGATIVE 10/28/2020 1602   UROBILINOGEN 0.2 08/13/2020 1112   NITRITE NEGATIVE 10/28/2020 1602   LEUKOCYTESUR NEGATIVE 10/28/2020 1602   Sepsis  Labs Invalid input(s): PROCALCITONIN,  WBC,  LACTICIDVEN Microbiology Recent Results (from the past 240 hour(s))  SARS CORONAVIRUS 2 (TAT 6-24 HRS) Nasopharyngeal Nasopharyngeal Swab     Status: None   Collection Time: 10/31/20  3:57 PM   Specimen: Nasopharyngeal Swab  Result Value Ref Range Status   SARS Coronavirus 2 NEGATIVE NEGATIVE Final    Comment: (NOTE) SARS-CoV-2 target nucleic acids are NOT DETECTED.  The SARS-CoV-2 RNA is generally detectable in upper and lower respiratory specimens during the acute phase of infection. Negative results do not preclude SARS-CoV-2 infection, do not rule out co-infections with other pathogens, and should not be used as the sole basis for treatment or other patient management decisions. Negative results must be combined with clinical observations, patient history, and epidemiological information. The expected result is Negative.  Fact Sheet for Patients: SugarRoll.be  Fact Sheet for Healthcare Providers: https://www.woods-mathews.com/  This test is not yet approved or cleared by the [place] FDA and  has been authorized for detection and/or diagnosis of SARS-CoV-2 by FDA under an Emergency Use Authorization (EUA). This EUA will remain  in effect (meaning this test can be used) for the duration of the COVID-19 declaration under Se ction 564(b)(1) of the Act, 21 U.S.C. section 360bbb-3(b)(1), unless the authorization is terminated or revoked sooner.  Performed at [facility], [place] [address] [place],  [address]        SIGNED:   Aline August, MD  [facility] 11/05/2020, 7:36 AM

## 2020-11-05 NOTE — Progress Notes (Signed)
Pt Discharged home with son in law. Went over discahrge instructions with patient and she showed understanding. IV taken out of right forearm without complications. Pt Wheeled down to medical mall by nurse and helped into car. Pt took all belongings.

## 2020-11-05 NOTE — TOC Progression Note (Signed)
Transition of Care [facility]) - Progression Note    Patient Details  Name: Chelsea [name] MRN: [id number] Date of Birth: October 30, 1945  Transition of Care [facility]) CM/SW [place], RN Phone Number: 11/05/2020, 1:51 PM  Clinical Narrative:   RNCM reached out to [facility] with [facility] and they will be able to service patient for home health.          Expected Discharge Plan and Services           Expected Discharge Date: 11/05/20                                     Social Determinants of Health (SDOH) Interventions    Readmission Risk Interventions No flowsheet data found.

## 2020-11-06 ENCOUNTER — Telehealth: Payer: Self-pay

## 2020-11-06 NOTE — Telephone Encounter (Signed)
Transition Care Management Unsuccessful Follow-up Telephone Call  Date of discharge and from where:  11/05/2020 [facility]  Attempts:  1st Attempt  Reason for unsuccessful TCM follow-up call:  Left voice message

## 2020-11-07 ENCOUNTER — Emergency Department: Payer: Medicare Other

## 2020-11-07 ENCOUNTER — Other Ambulatory Visit: Payer: Self-pay

## 2020-11-07 ENCOUNTER — Emergency Department
Admission: EM | Admit: 2020-11-07 | Discharge: 2020-11-13 | Disposition: A | Discharge status: Home / Self Care | Payer: Medicare Other | Attending: Emergency Medicine | Admitting: Emergency Medicine

## 2020-11-07 DIAGNOSIS — S9002XA Contusion of left ankle, initial encounter: Secondary | ICD-10-CM

## 2020-11-07 DIAGNOSIS — R54 Age-related physical debility: Secondary | ICD-10-CM | POA: Diagnosis not present

## 2020-11-07 DIAGNOSIS — Z86010 Personal history of colonic polyps: Secondary | ICD-10-CM | POA: Insufficient documentation

## 2020-11-07 DIAGNOSIS — W01198A Fall on same level from slipping, tripping and stumbling with subsequent striking against other object, initial encounter: Secondary | ICD-10-CM | POA: Insufficient documentation

## 2020-11-07 DIAGNOSIS — R296 Repeated falls: Secondary | ICD-10-CM | POA: Insufficient documentation

## 2020-11-07 DIAGNOSIS — S8002XA Contusion of left knee, initial encounter: Secondary | ICD-10-CM

## 2020-11-07 DIAGNOSIS — I10 Essential (primary) hypertension: Secondary | ICD-10-CM | POA: Diagnosis not present

## 2020-11-07 DIAGNOSIS — Z7982 Long term (current) use of aspirin: Secondary | ICD-10-CM | POA: Insufficient documentation

## 2020-11-07 DIAGNOSIS — Z20822 Contact with and (suspected) exposure to covid-19: Secondary | ICD-10-CM | POA: Diagnosis not present

## 2020-11-07 DIAGNOSIS — M25372 Other instability, left ankle: Secondary | ICD-10-CM | POA: Insufficient documentation

## 2020-11-07 DIAGNOSIS — Y92129 Unspecified place in nursing home as the place of occurrence of the external cause: Secondary | ICD-10-CM | POA: Diagnosis not present

## 2020-11-07 DIAGNOSIS — W19XXXA Unspecified fall, initial encounter: Secondary | ICD-10-CM

## 2020-11-07 DIAGNOSIS — R410 Disorientation, unspecified: Secondary | ICD-10-CM | POA: Diagnosis not present

## 2020-11-07 DIAGNOSIS — Y9301 Activity, walking, marching and hiking: Secondary | ICD-10-CM | POA: Diagnosis not present

## 2020-11-07 DIAGNOSIS — Z79899 Other long term (current) drug therapy: Secondary | ICD-10-CM | POA: Insufficient documentation

## 2020-11-07 DIAGNOSIS — S40012A Contusion of left shoulder, initial encounter: Secondary | ICD-10-CM

## 2020-11-07 DIAGNOSIS — M25373 Other instability, unspecified ankle: Secondary | ICD-10-CM

## 2020-11-07 DIAGNOSIS — S4992XA Unspecified injury of left shoulder and upper arm, initial encounter: Secondary | ICD-10-CM | POA: Diagnosis present

## 2020-11-07 LAB — BASIC METABOLIC PANEL
Anion gap: 11 (ref 5–15)
BUN: 19 mg/dL (ref 8–23)
CO2: 29 mmol/L (ref 22–32)
Calcium: 9.3 mg/dL (ref 8.9–10.3)
Chloride: 100 mmol/L (ref 98–111)
Creatinine, Ser: 0.85 mg/dL (ref 0.44–1.00)
GFR, Estimated: >60 mL/min (ref >60)
Glucose, Bld: 106 mg/dL — ABNORMAL HIGH (ref 70–99)
Potassium: 3.3 mmol/L — ABNORMAL LOW (ref 3.5–5.1)
Sodium: 140 mmol/L (ref 135–145)

## 2020-11-07 LAB — CBC WITH DIFFERENTIAL/PLATELET
Abs Immature Granulocytes: 0.08 10*3/uL — ABNORMAL HIGH (ref 0.00–0.07)
Basophils Absolute: 0 10*3/uL (ref 0.0–0.1)
Basophils Relative: 0 %
Eosinophils Absolute: 0 10*3/uL (ref 0.0–0.5)
Eosinophils Relative: 0 %
HCT: 33.2 % — ABNORMAL LOW (ref 36.0–46.0)
Hemoglobin: 10.8 g/dL — ABNORMAL LOW (ref 12.0–15.0)
Immature Granulocytes: 1 %
Lymphocytes Relative: 13 %
Lymphs Abs: 1.8 10*3/uL (ref 0.7–4.0)
MCH: 30.6 pg (ref 26.0–34.0)
MCHC: 32.5 g/dL (ref 30.0–36.0)
MCV: 94.1 fL (ref 80.0–100.0)
Monocytes Absolute: 1.6 10*3/uL — ABNORMAL HIGH (ref 0.1–1.0)
Monocytes Relative: 11 %
Neutro Abs: 10.9 10*3/uL — ABNORMAL HIGH (ref 1.7–7.7)
Neutrophils Relative %: 75 %
Platelets: 206 10*3/uL (ref 150–400)
RBC: 3.53 MIL/uL — ABNORMAL LOW (ref 3.87–5.11)
RDW: 14.6 % (ref 11.5–15.5)
WBC: 14.4 10*3/uL — ABNORMAL HIGH (ref 4.0–10.5)
nRBC: 0 % (ref 0.0–0.2)

## 2020-11-07 IMAGING — CR DG CHEST 2V
1 series · 2 of 2 positions shown · non-contrast
Comparison: [DATE]

CLINICAL DATA: Fall

EXAM:
CHEST - 2 VIEW

[Series 1: dg chest 2 view · 0.14mm/px · 2 of 2 slices shown]
[im 1/2]
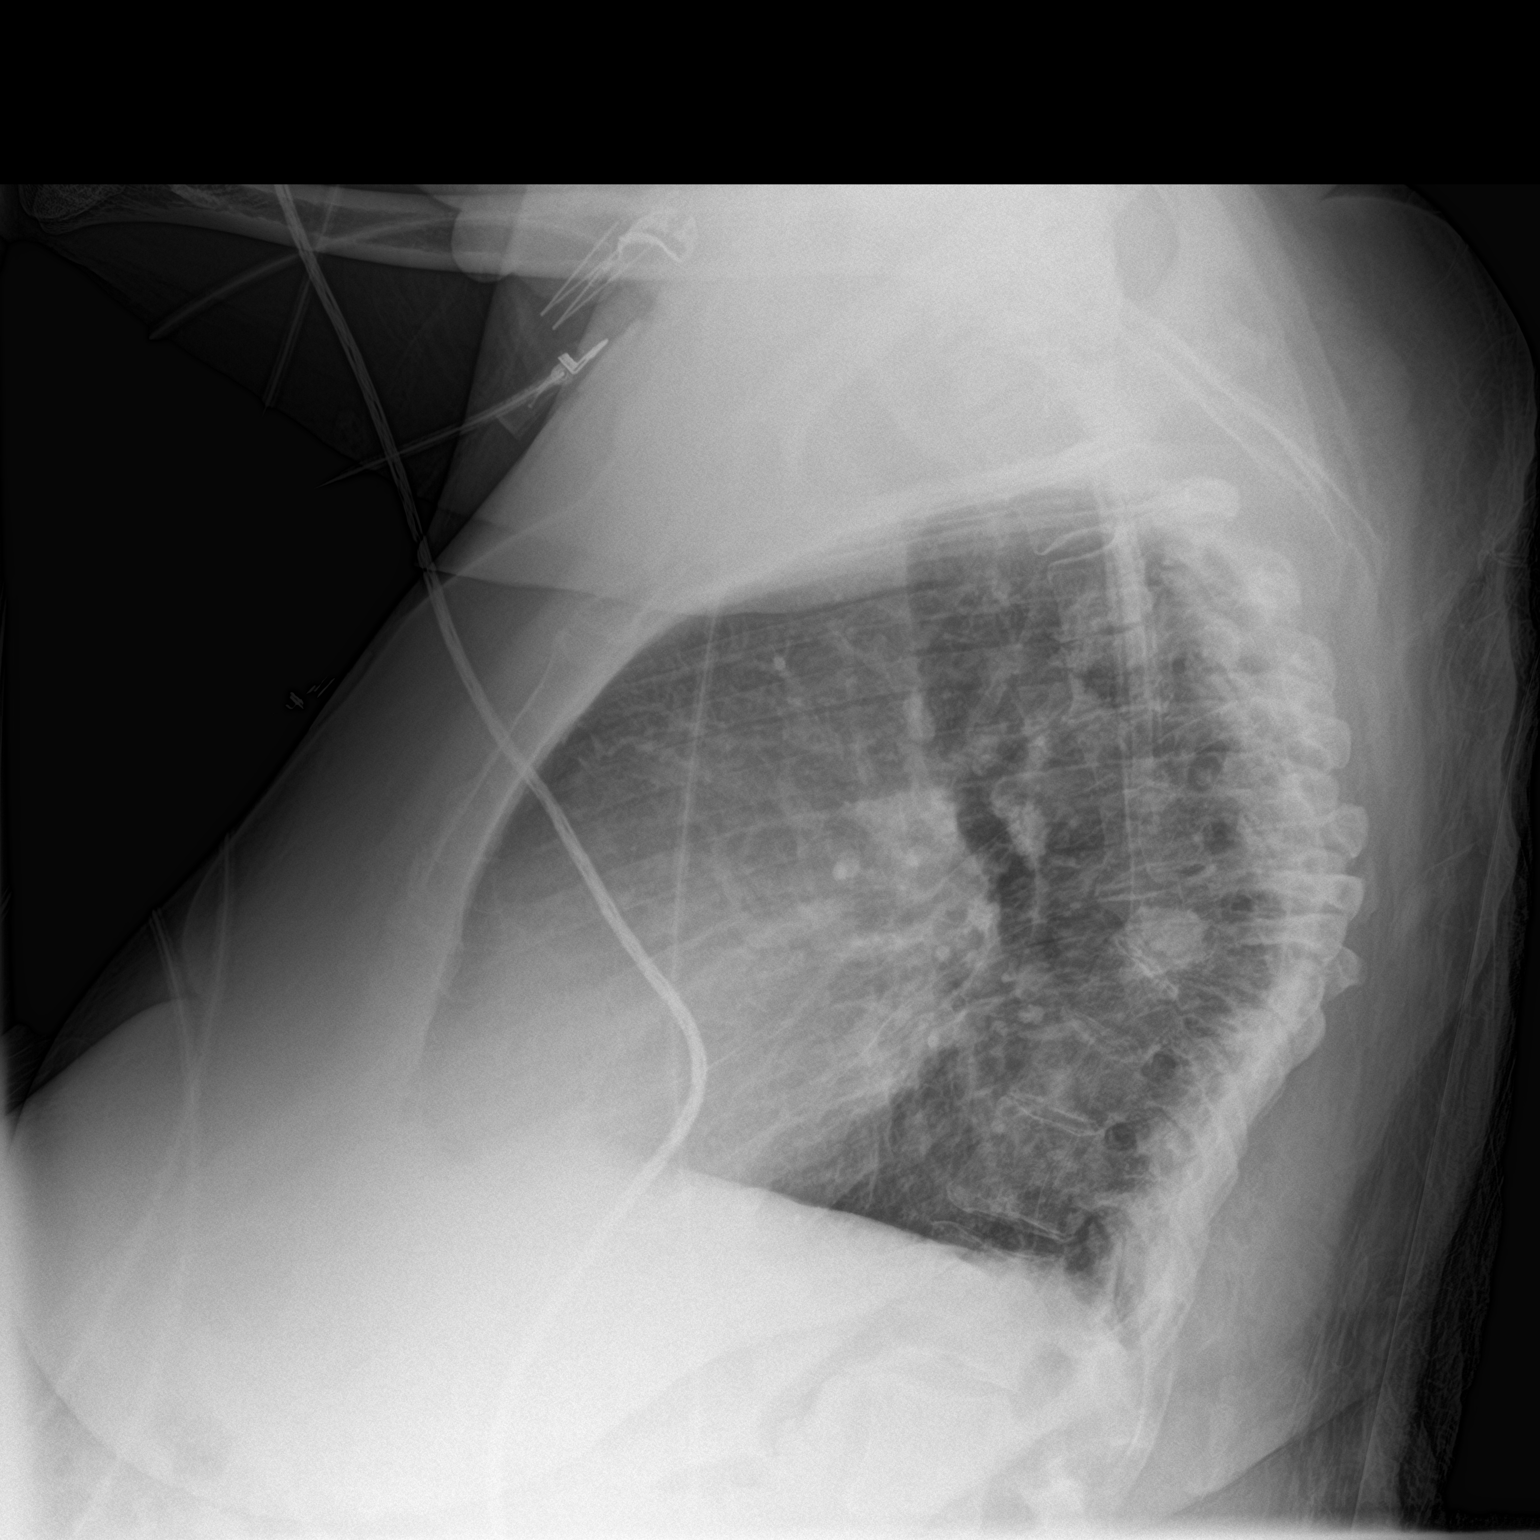
[im 2/2]
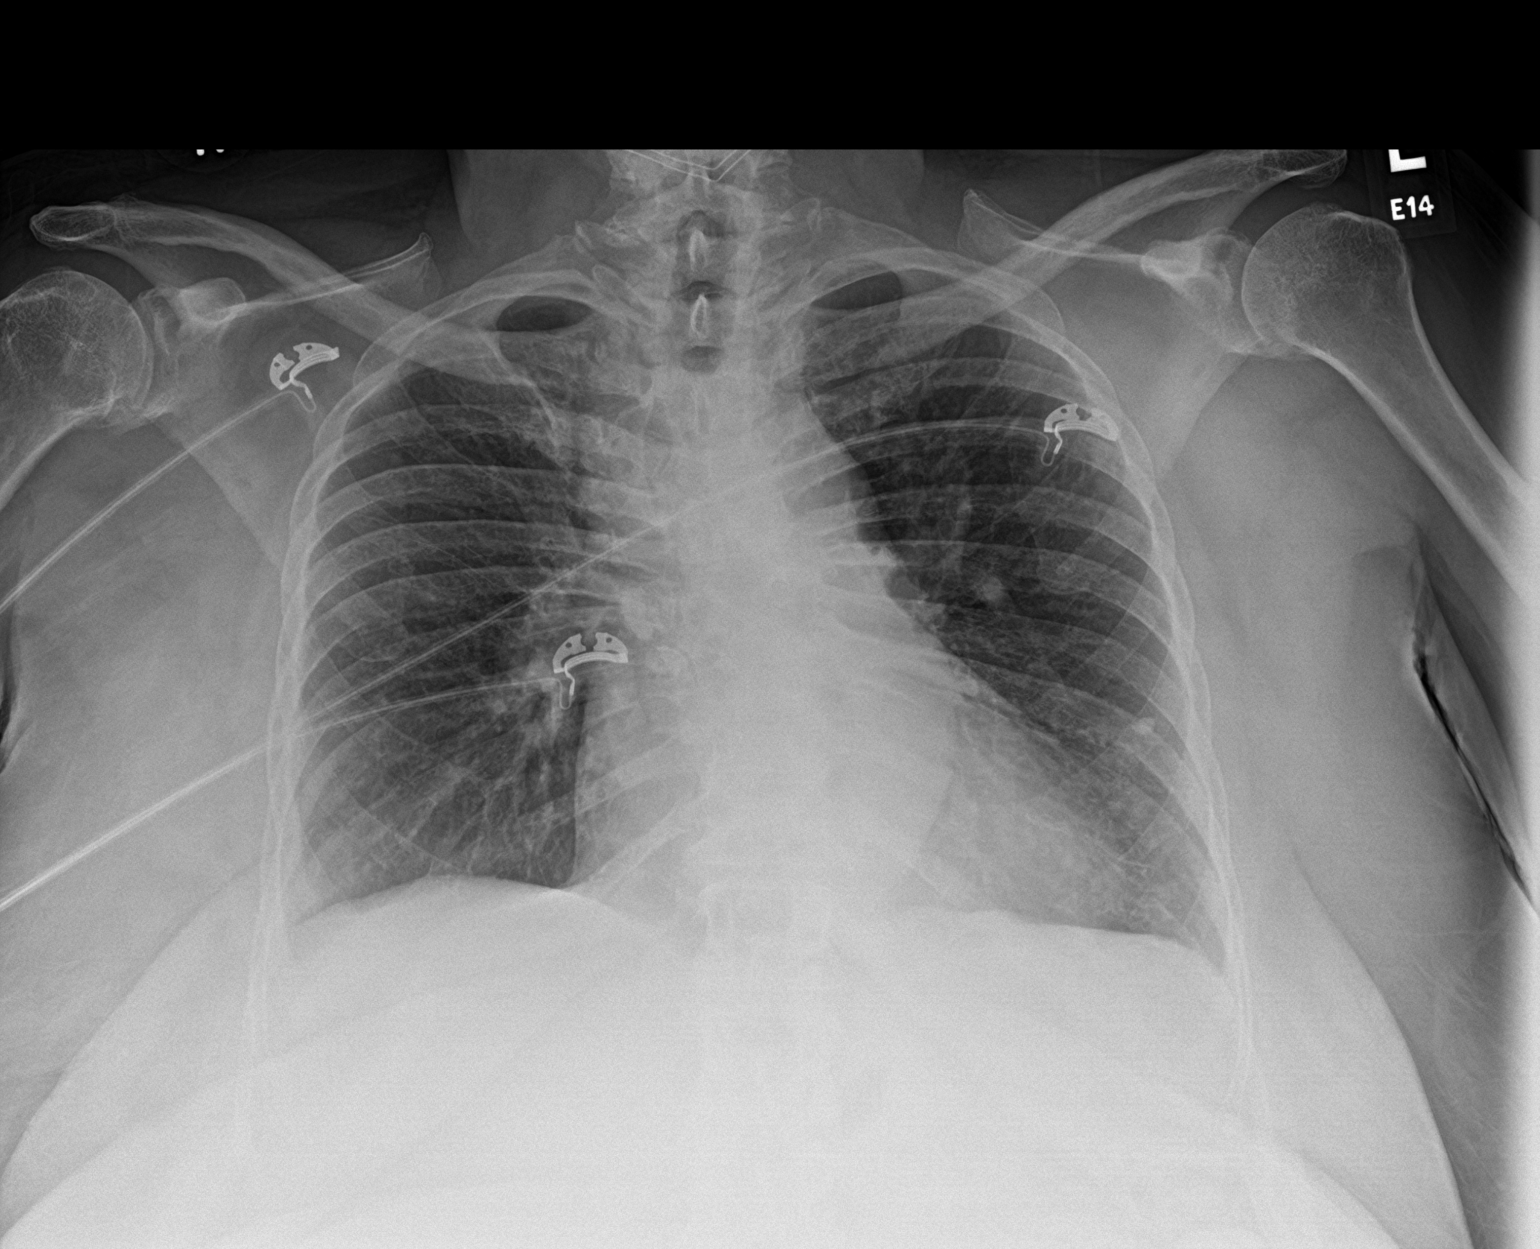

[2 of 2 positions shown; findings below may reference images not displayed]

FINDINGS: The cardiomediastinal silhouette is unchanged in contour. Tortuous
thoracic aorta.Calcified granuloma of the LEFT mid lung, unchanged.
No pleural effusion. No pneumothorax. No acute pleuroparenchymal
abnormality. Visualized abdomen is unremarkable. Unchanged chronic
deformity of the mid thoracic spine.
IMPRESSION: No acute cardiopulmonary abnormality.

## 2020-11-07 IMAGING — CR DG KNEE COMPLETE 4+V*L*
1 series · 4 of 4 positions shown · non-contrast
Comparison: None.

CLINICAL DATA: Fall

EXAM:
LEFT KNEE - COMPLETE 4+ VIEW

[Series 1: dg knee complete 4 views left · 0.14mm/px · 4 of 4 slices shown]
[im 1/4]
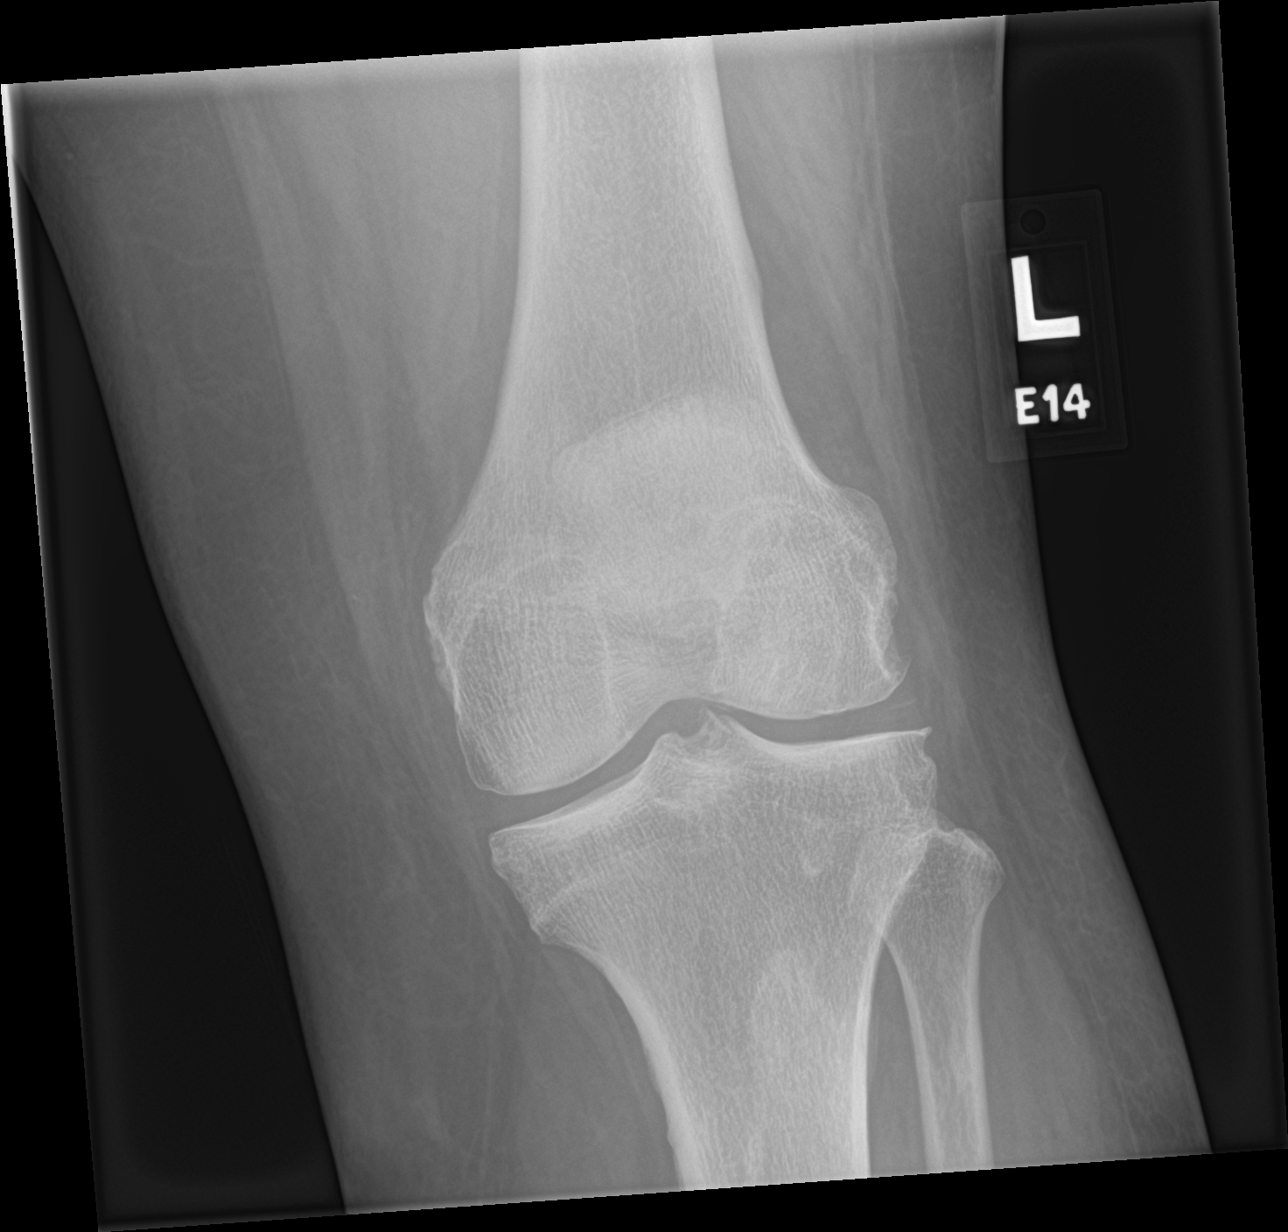
[im 2/4]
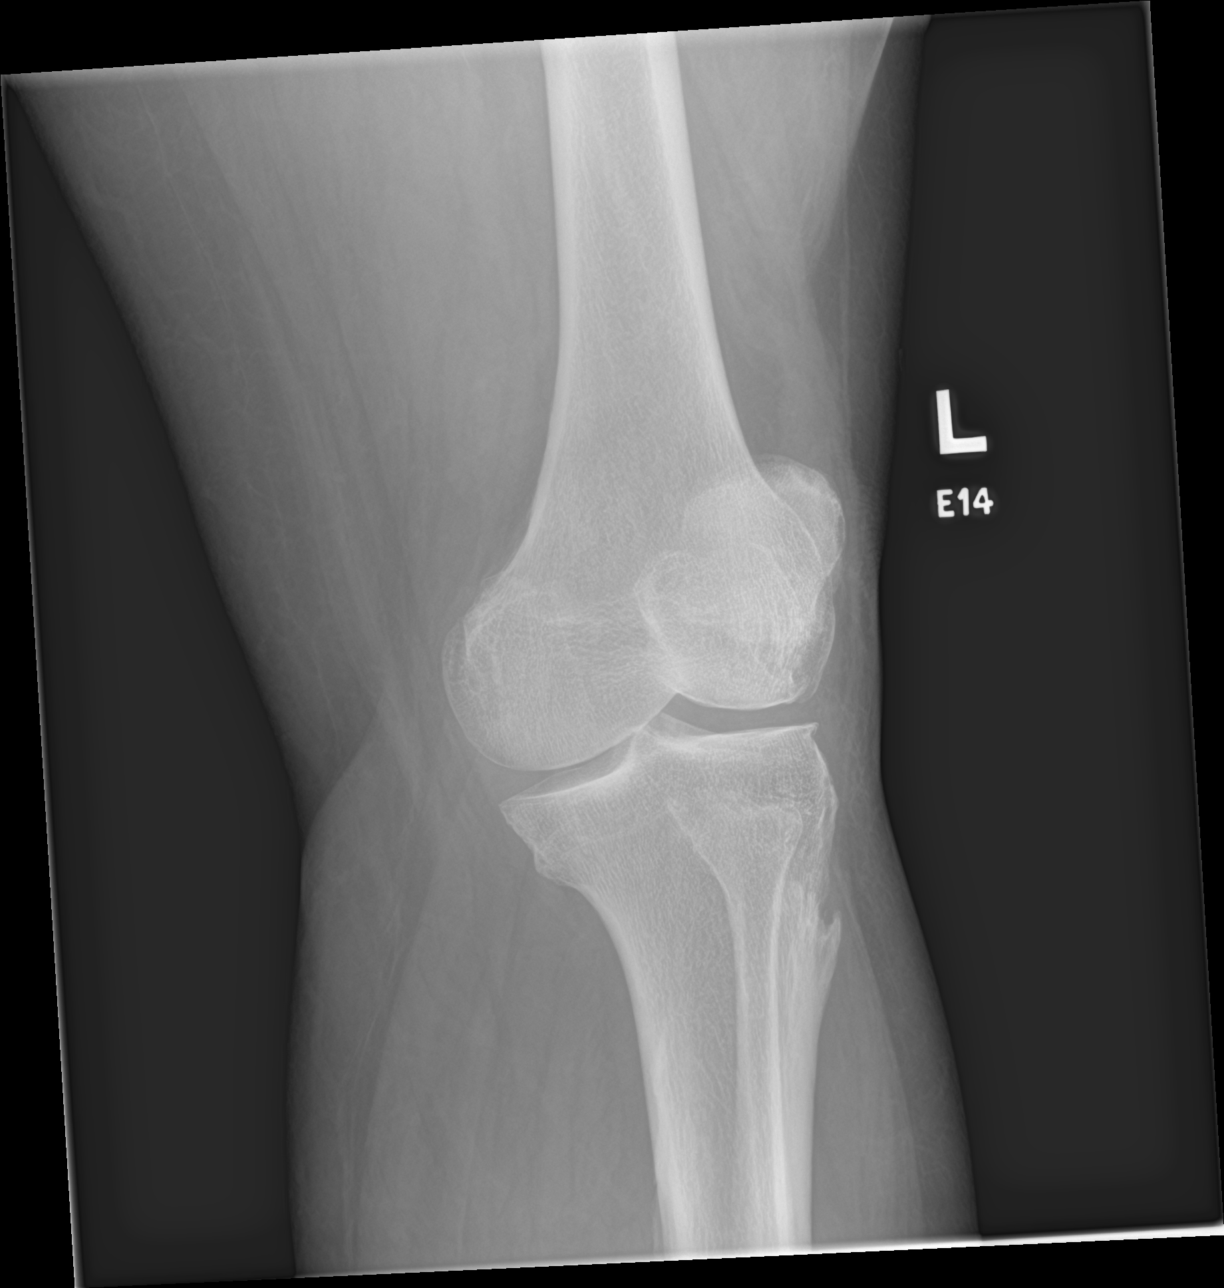
[im 3/4]
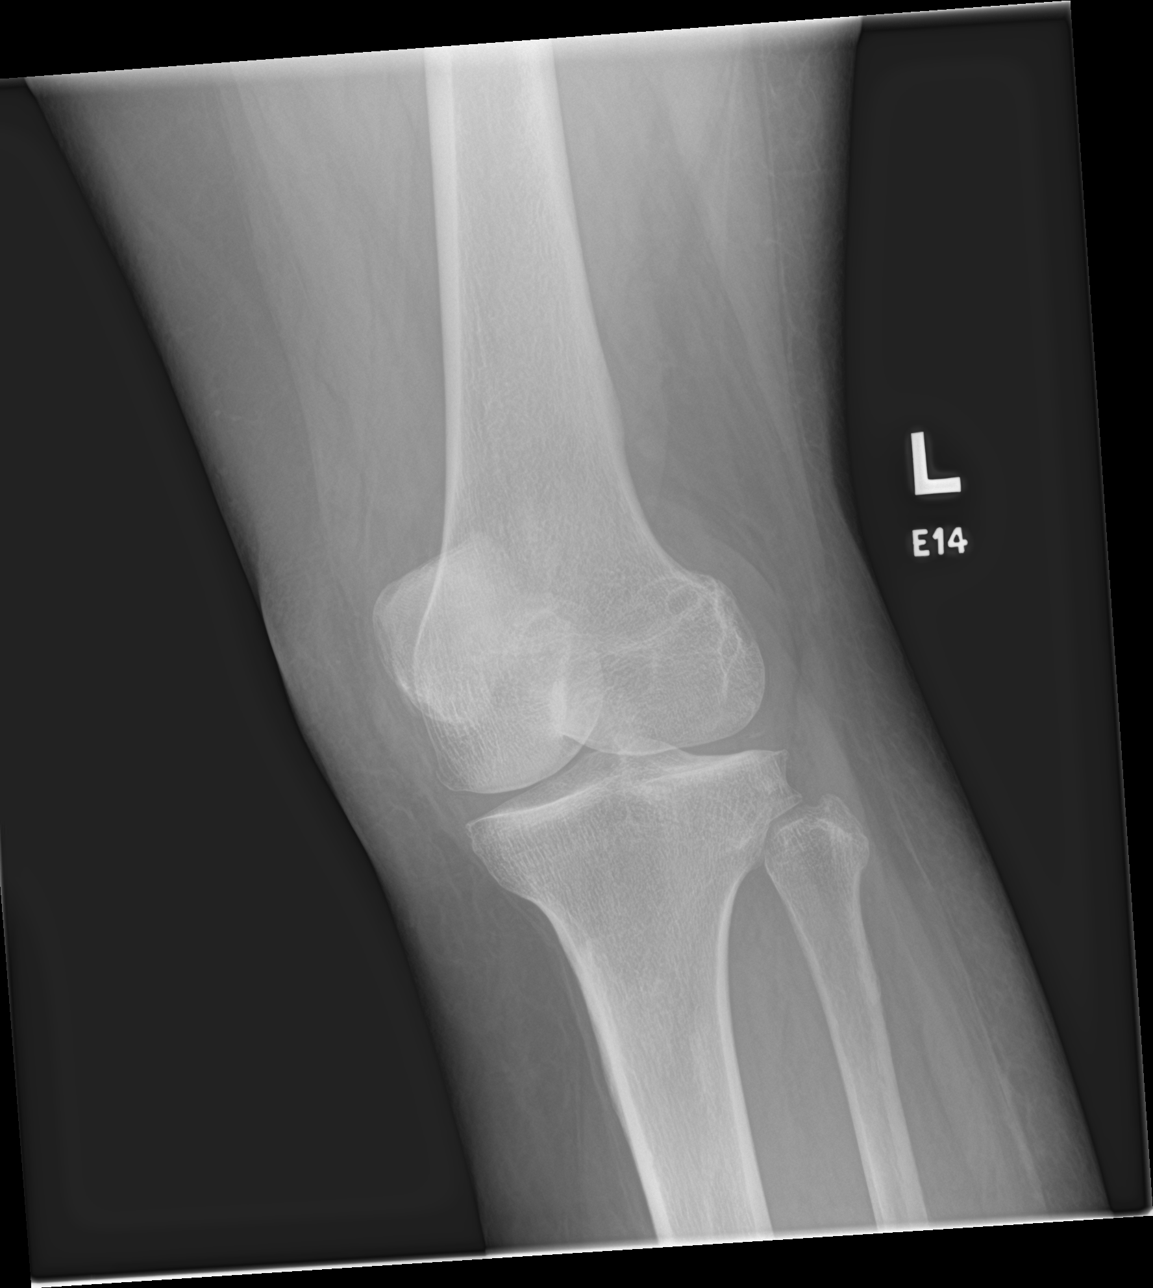
[im 4/4]
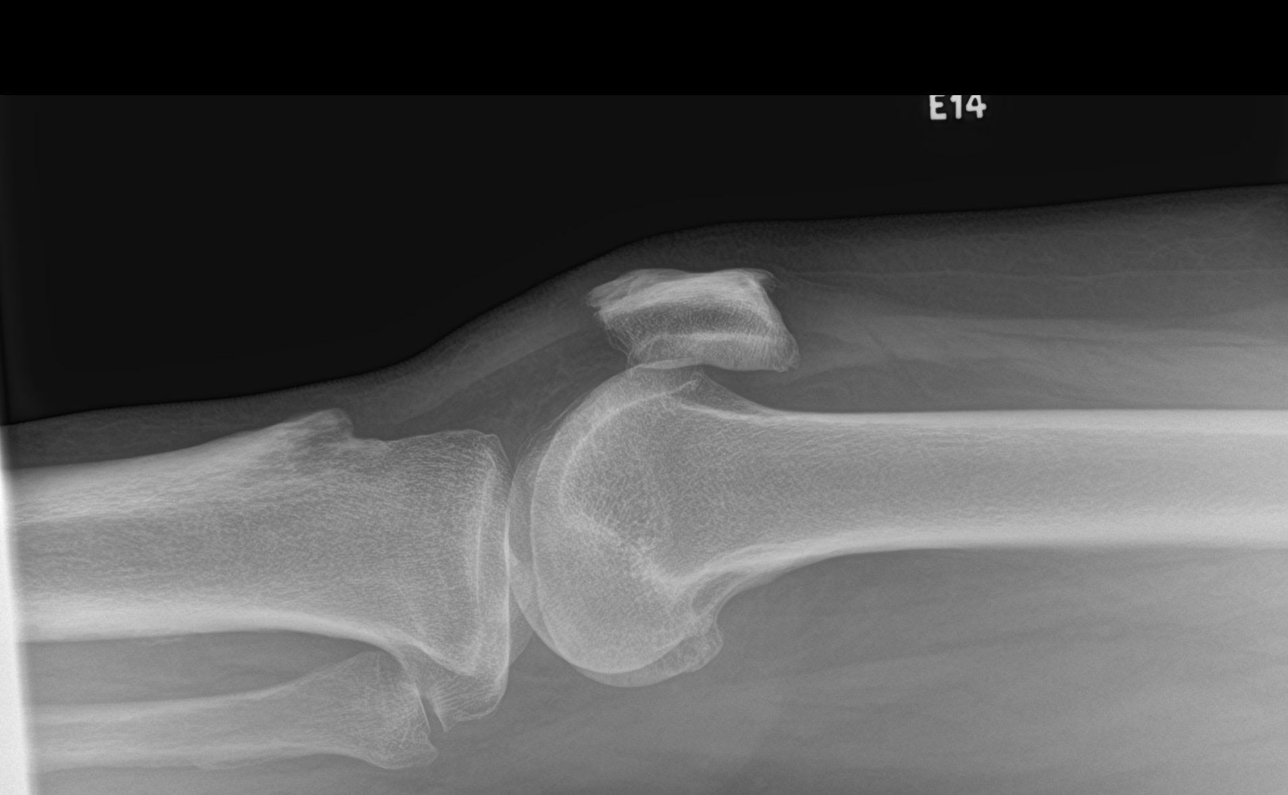

[4 of 4 positions shown; findings below may reference images not displayed]

FINDINGS: No acute fracture or dislocation. Mild degenerative changes of the
lateral compartment. Enthesophyte of the quadriceps tendon and
patellar tendon insertions on the patella. No area of erosion or
osseous destruction. No unexpected radiopaque foreign body. Soft
tissues are unremarkable.
IMPRESSION: No acute fracture or dislocation.

## 2020-11-07 IMAGING — CT CT HEAD W/O CM
3 series · 16 of 47 positions shown, 19 images · non-contrast
Comparison: [DATE]

CLINICAL DATA: Multiple falls

EXAM:
CT HEAD WITHOUT CONTRAST
CT CERVICAL SPINE WITHOUT CONTRAST
TECHNIQUE: Multidetector CT imaging of the head and cervical spine was
performed following the standard protocol without intravenous
contrast. Multiplanar CT image reconstructions of the cervical spine
were also generated.

[Series 2: head wo · axial · 0.39mm/px · z∈[+3,+128]mm · 10 of 31 slices shown, 13 images]
[im 3/31  brain]
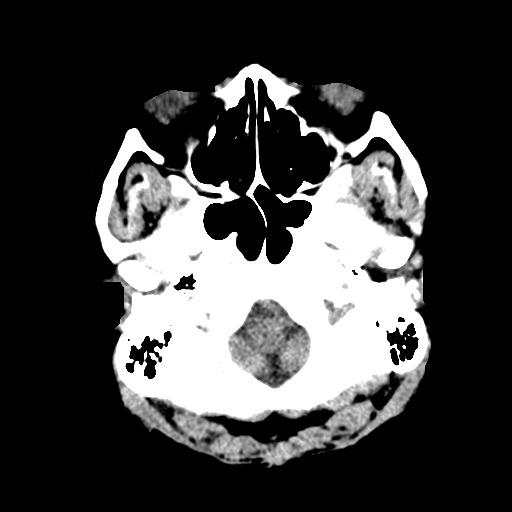
[im 3/31  bone]
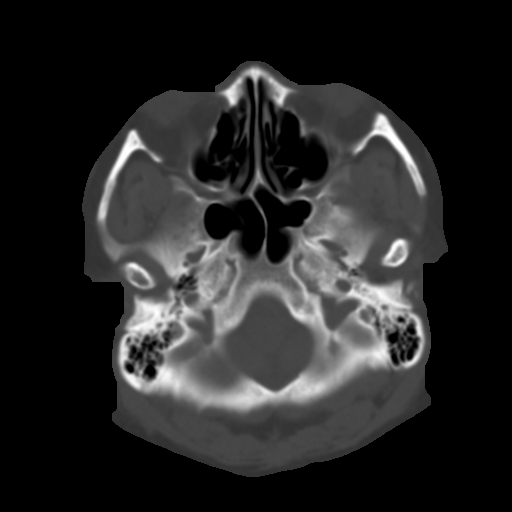
[im 6/31  brain]
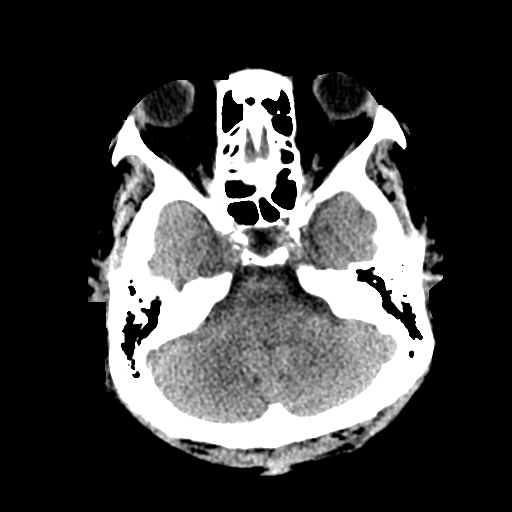
[im 9/31  brain]
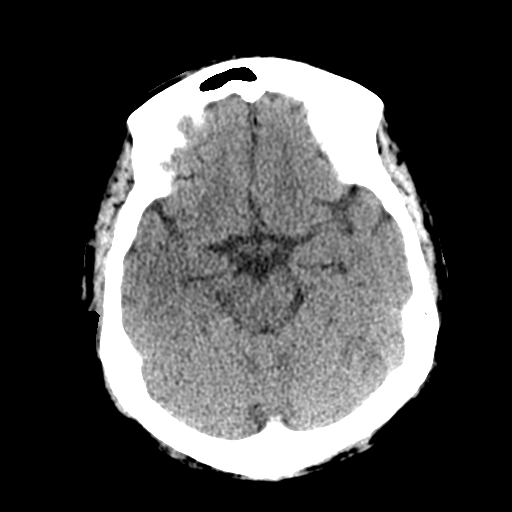
[im 11/31  brain]
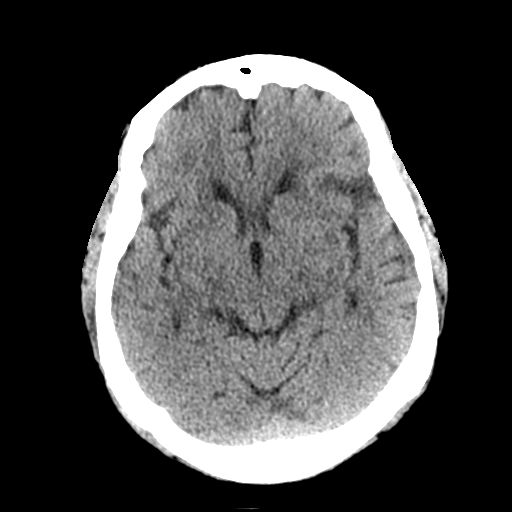
[im 14/31  brain]
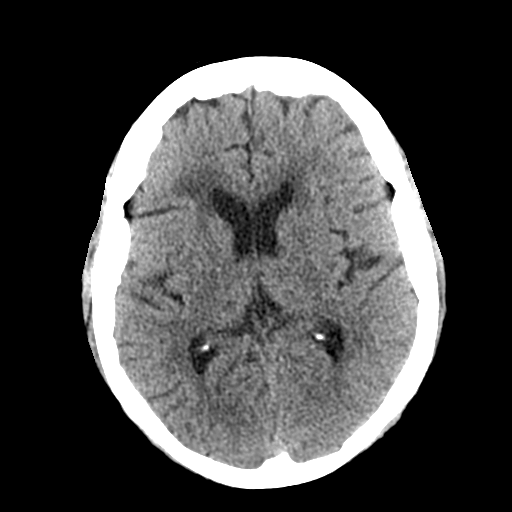
[im 14/31  bone]
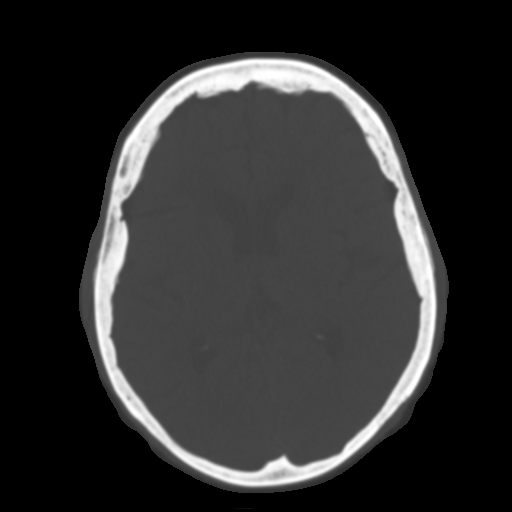
[im 17/31  brain]
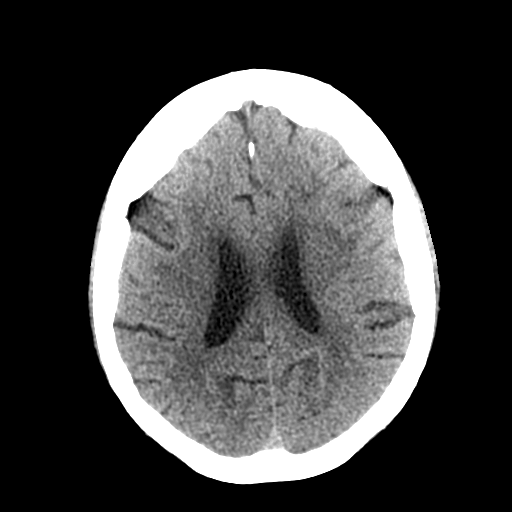
[im 20/31  brain]
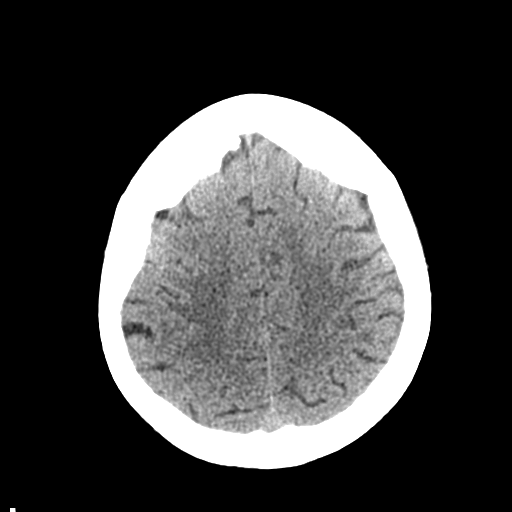
[im 23/31  brain]
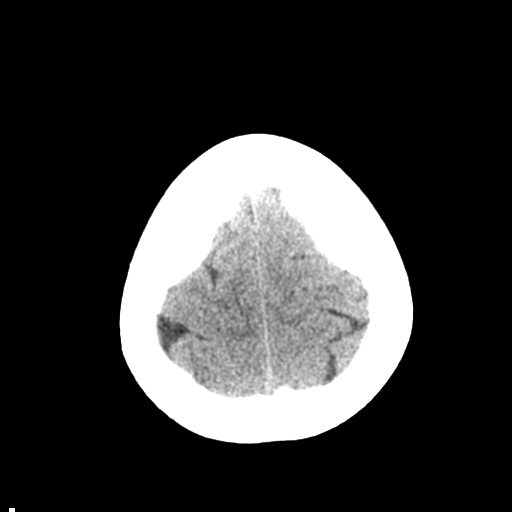
[im 25/31  brain]
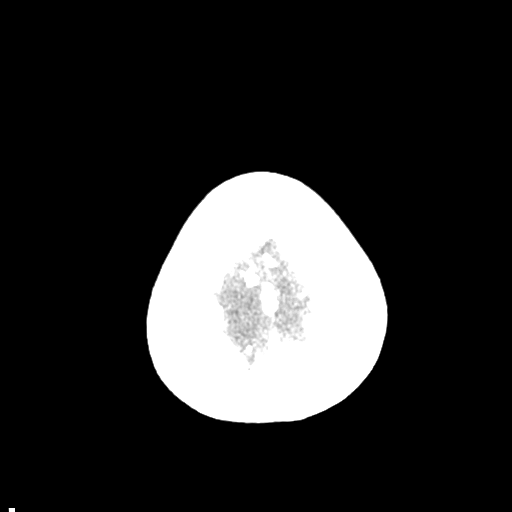
[im 25/31  bone]
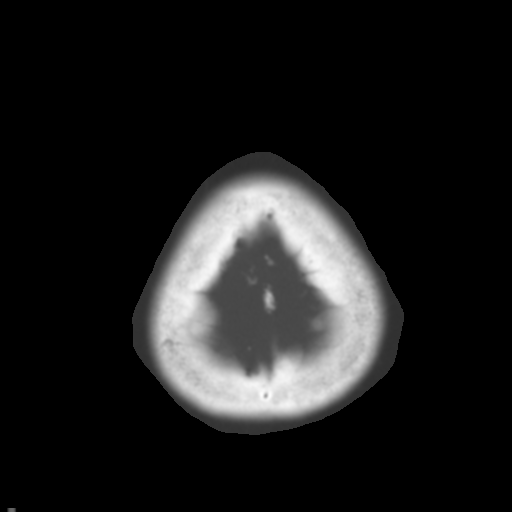
[im 28/31  brain]
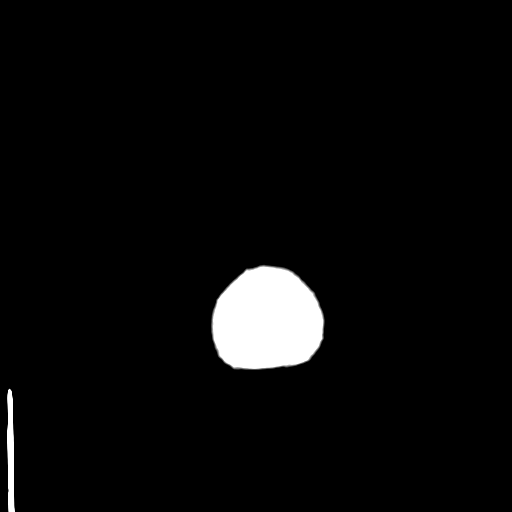

[Series 4: coronal soft tissue · coronal · 0.29mm/px · 3 of 61 slices shown]
[im 21/61  brain]
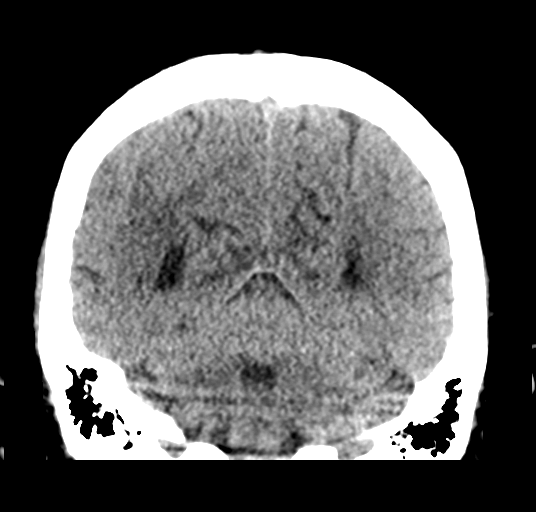
[im 27/61  brain]
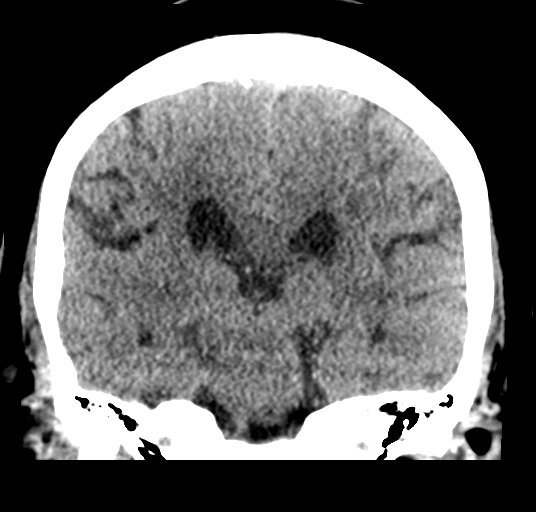
[im 34/61  brain]
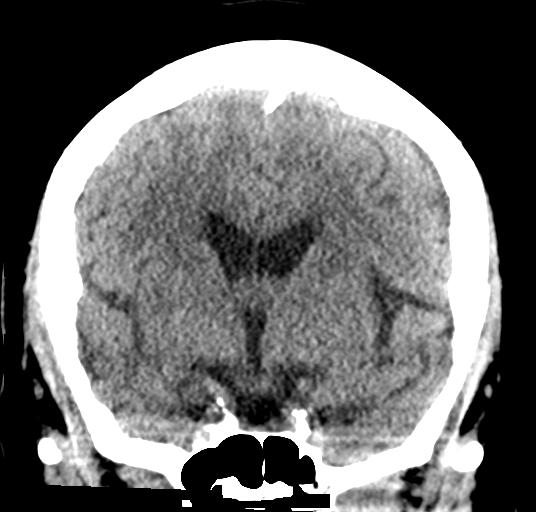

[Series 5: sagittal soft tissue · sagittal · 0.30mm/px · 3 of 56 slices shown]
[im 19/56  brain]
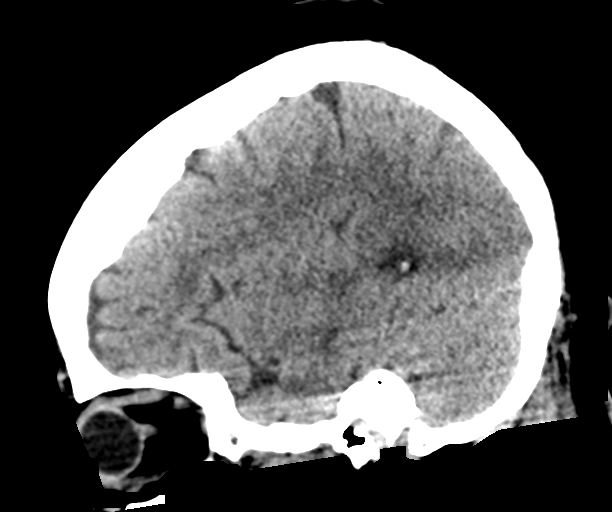
[im 28/56  brain]
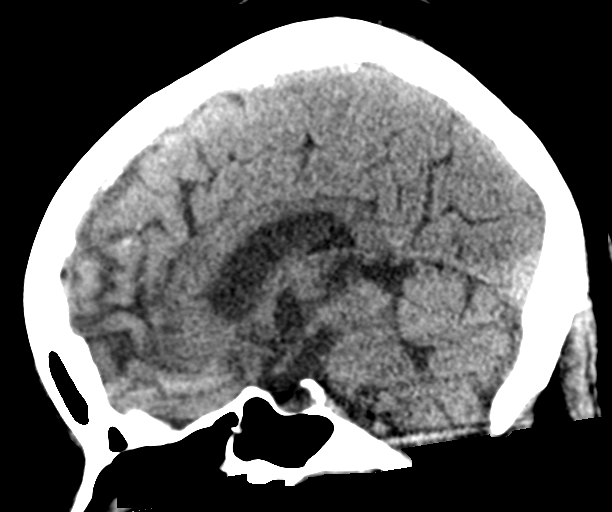
[im 37/56  brain]
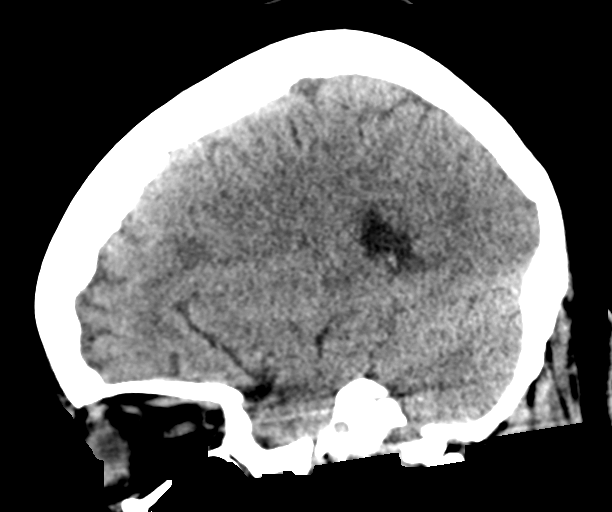

[16 of 47 positions shown; findings below may reference images not displayed]

FINDINGS: CT HEAD FINDINGS

Brain: No evidence of acute infarction, hemorrhage, hydrocephalus,
extra-axial collection or mass lesion/mass effect. Periventricular
and deep white matter hypodensity.

Vascular: No hyperdense vessel or unexpected calcification.

Skull: Normal. Negative for fracture or focal lesion.

Sinuses/Orbits: No acute finding.

Other: None.

CT CERVICAL SPINE FINDINGS

Alignment: Normal.

Skull base and vertebrae: No acute fracture. No primary bone lesion
or focal pathologic process.

Soft tissues and spinal canal: No prevertebral fluid or swelling. No
visible canal hematoma.

Disc levels: Moderate multilevel disc space height loss and
osteophytosis.

Upper chest: Negative.

Other: None.
IMPRESSION: 1. No acute intracranial pathology. Small-vessel white matter
disease.
2. No fracture or static subluxation of the cervical spine. Moderate
multilevel disc space height loss and osteophytosis.

## 2020-11-07 IMAGING — CR DG ANKLE COMPLETE 3+V*L*
1 series · 3 of 3 positions shown · non-contrast
Comparison: None.

CLINICAL DATA: Fall

EXAM:
LEFT ANKLE COMPLETE - 3+ VIEW

[Series 1: dg ankle complete left · 0.14mm/px · 3 of 3 slices shown]
[im 1/3]
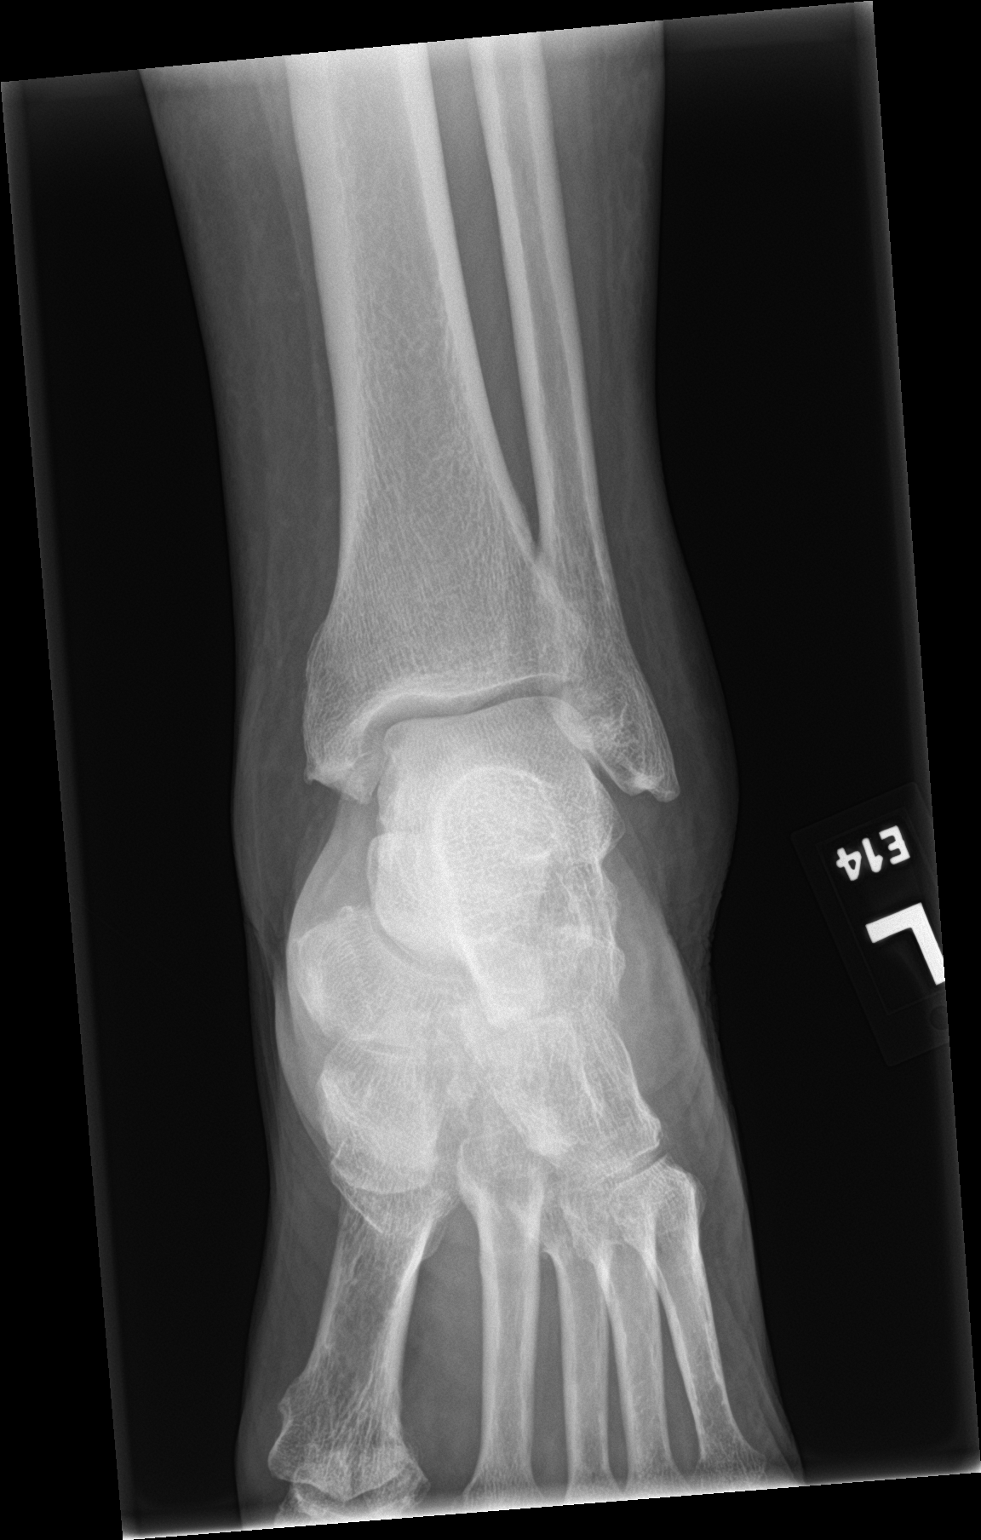
[im 2/3]
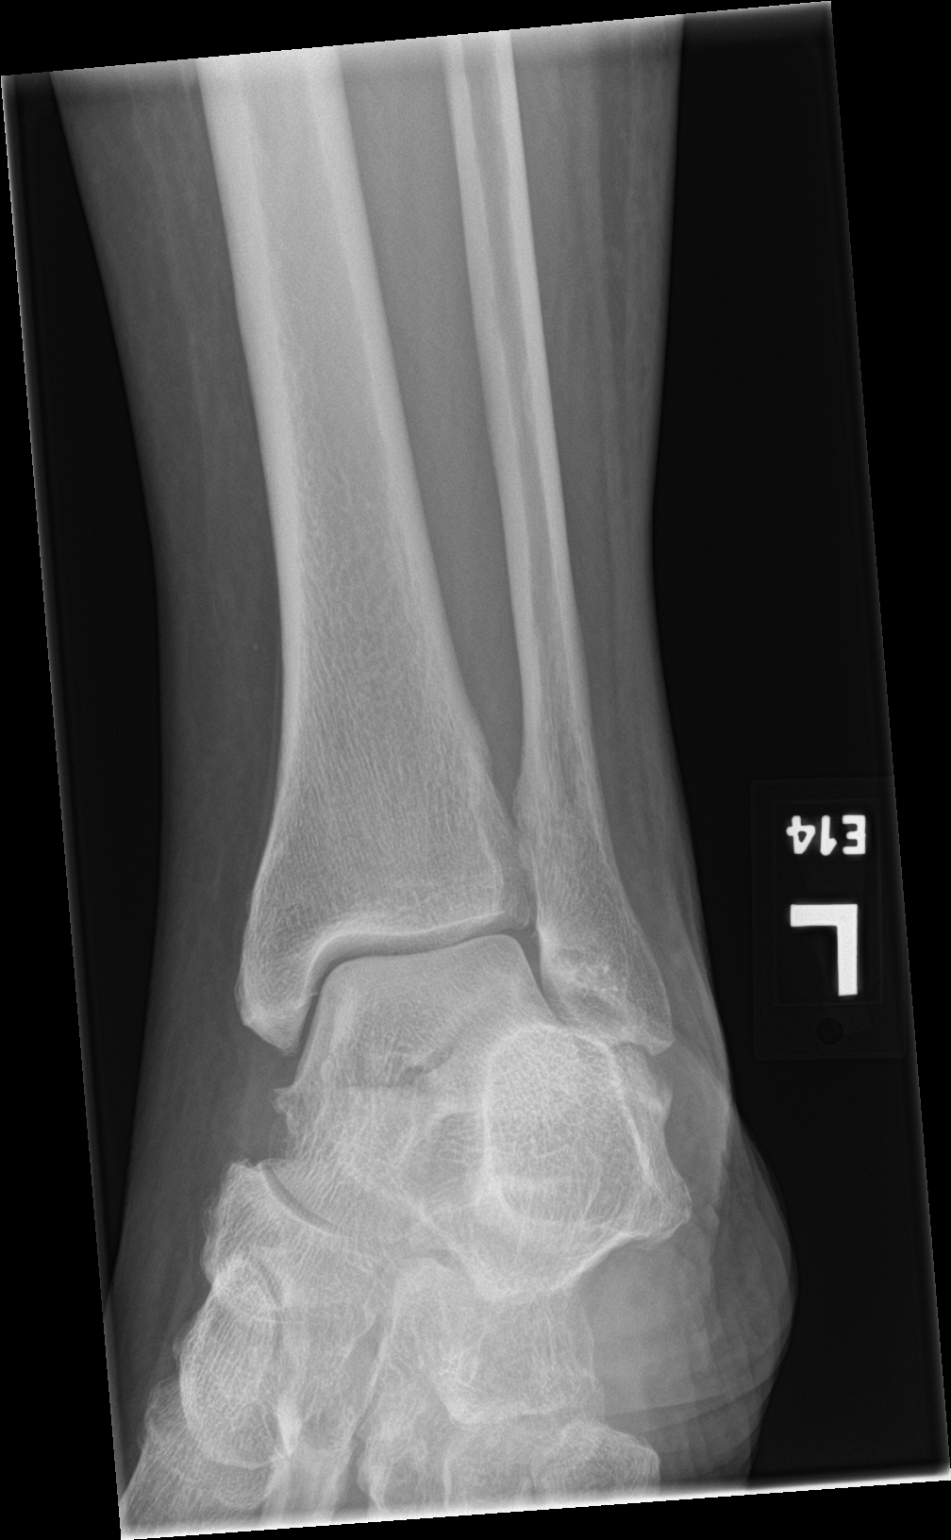
[im 3/3]
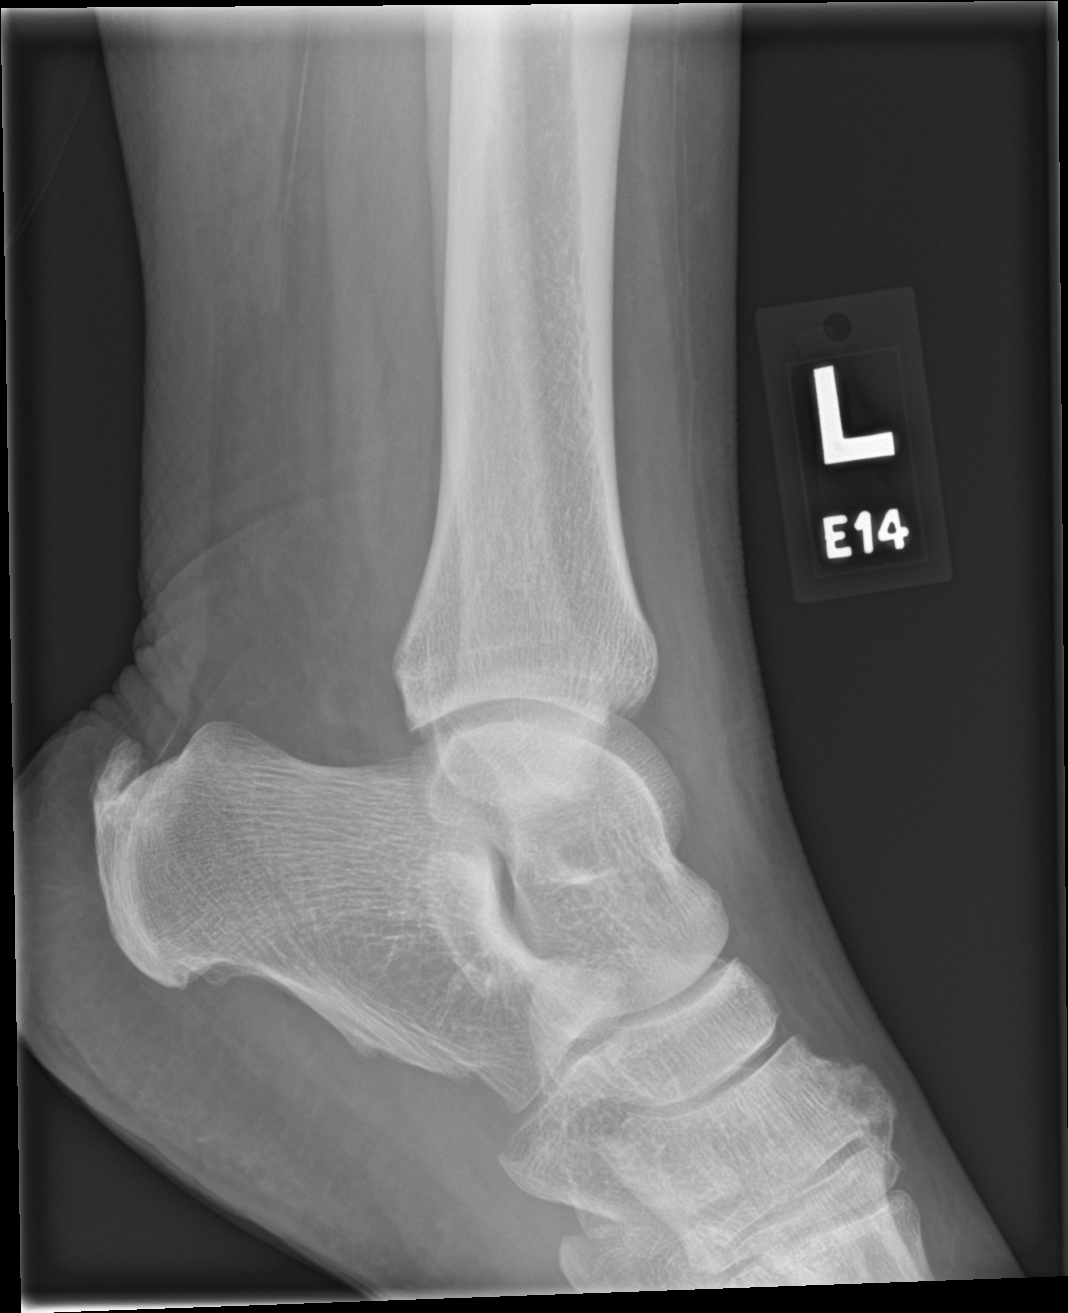

[3 of 3 positions shown; findings below may reference images not displayed]

FINDINGS: No acute fracture or dislocation. Ankle mortise is preserved.
Enthesophyte of the Achilles tendon. Midfoot degenerative changes.
No area of erosion or osseous destruction. No unexpected radiopaque
foreign body. Soft tissues are unremarkable.
IMPRESSION: No acute fracture or dislocation.

## 2020-11-07 MED ORDER — ALPRAZOLAM 0.5 MG PO TABS
0.5000 mg | ORAL_TABLET | Freq: Three times a day (TID) | ORAL | Status: DC | PRN
  Administered 2020-11-07 – 2020-11-13 (×11): 0.5 mg via ORAL
  Filled 2020-11-07 (×11): qty 1

## 2020-11-07 MED ORDER — ATORVASTATIN CALCIUM 20 MG PO TABS
40.0000 mg | ORAL_TABLET | Freq: Every day | ORAL | Status: DC
  Administered 2020-11-07 – 2020-11-13 (×7): 40 mg via ORAL
  Filled 2020-11-07 (×7): qty 2

## 2020-11-07 MED ORDER — ACETAMINOPHEN 500 MG PO TABS
1000.0000 mg | ORAL_TABLET | Freq: Once | ORAL | Status: AC
  Administered 2020-11-07: 1000 mg via ORAL
  Filled 2020-11-07: qty 2

## 2020-11-07 MED ORDER — TRIAMTERENE-HCTZ 37.5-25 MG PO TABS
1.0000 | ORAL_TABLET | Freq: Every day | ORAL | Status: DC
  Administered 2020-11-08 – 2020-11-13 (×6): 1 via ORAL
  Filled 2020-11-07 (×8): qty 1

## 2020-11-07 MED ORDER — TRAMADOL HCL 50 MG PO TABS
50.0000 mg | ORAL_TABLET | Freq: Four times a day (QID) | ORAL | Status: DC | PRN
  Administered 2020-11-07 – 2020-11-12 (×4): 50 mg via ORAL
  Filled 2020-11-07 (×4): qty 1

## 2020-11-07 MED ORDER — GABAPENTIN 100 MG PO CAPS
200.0000 mg | ORAL_CAPSULE | Freq: Three times a day (TID) | ORAL | Status: DC
Start: 2020-11-07 — End: 2020-11-13
  Administered 2020-11-07 – 2020-11-13 (×17): 200 mg via ORAL
  Filled 2020-11-07 (×17): qty 2

## 2020-11-07 MED ORDER — ACETAMINOPHEN 325 MG PO TABS
650.0000 mg | ORAL_TABLET | ORAL | Status: DC | PRN
  Administered 2020-11-07 – 2020-11-13 (×14): 650 mg via ORAL
  Filled 2020-11-07 (×14): qty 2

## 2020-11-07 NOTE — ED Notes (Signed)
Patient transported to X-ray 

## 2020-11-07 NOTE — TOC Initial Note (Addendum)
Transition of Care [facility]) - Initial/Assessment Note    Patient Details  Name: Chelsea [name] MRN: [id number] Date of Birth: Jan 22, 1946  [facility] CM/SW Contact:    Ova Freshwater Phone Number: [phone] 11/07/2020, 4:38 PM  Clinical Narrative:                  Patient presents to [facility] from [facility] due to fall, two days after d/c from [facility].  CSW spoke with patient and [name] (Daughter) [phone] and explained the role of TOC in patient care.  Patient is oriented x2. Patient returned to [facility] last week because there was an issue with her Medicare and it was not able to pay for SNF placement.  Patient's daughter Ms. Renea Ee stated they will private pay for the patient to be placed in a SNF.  Ms. Renea Ee stated her preference is for SNF in [place] because she lives there.  CSW explained SNF placement process and estimated timeline. Ms. Renea Ee verbalized understanding.  FL2 signed, PASRR# [id number], SNF placement search started.    Barriers to Discharge: SNF Pending bed offer   Patient Goals and CMS Choice Patient states their goals for this hospitalization and ongoing recovery are:: Goign to a SNF and then returning to [facility]      Expected Discharge Plan and Services                                                Prior Living Arrangements/Services                       Activities of Daily Living      Permission Sought/Granted                  Emotional Assessment              Admission diagnosis:  L shoulder pain EMS Patient Active Problem List   Diagnosis Date Noted  . Weakness 10/29/2020  . Age-related physical debility 10/28/2020  . Rhabdomyolysis 10/22/2020  . Right arm weakness 07/09/2020  . Right leg weakness 07/09/2020  . Urinary tract infection without hematuria 07/09/2020  . Abnormal urinalysis 07/09/2020  . Memory difficulties 07/09/2020  . Chronic  anemia 09/26/2018  . History of gastritis 05/03/2018  . History of Helicobacter pylori infection 05/03/2018  . Hx of adenomatous colonic polyps 05/03/2018  . BMI 40.0-44.9, adult ([place]) 03/15/2018  . Postmenopausal 03/15/2018  . Depression, major, recurrent, in partial remission ([place]) 01/11/2018  . Anxiety state 01/11/2018  . History of scoliosis 01/11/2018  . Pure hypercholesterolemia 01/11/2018  . Arthritis of big toe 10/19/2012  . Sinusitis 08/26/2012  . Physical exam, annual 05/16/2012  . Neck pain 04/11/2012  . Allergic conjunctivitis 06/23/2011  . Oral ulcer 06/23/2011  . GAD (generalized anxiety disorder) 05/14/2011  . Essential hypertension 02/06/2011  . Hyperlipidemia 02/06/2011  . Spinal stenosis of lumbosacral region 02/06/2011  . S/P breast biopsy 02/06/2011   PCP:  Verl Bangs, FNP Pharmacy:   [id number] [address], [place] [address] Phone: [phone] Fax: [phone]     Social Determinants of Health (SDOH) Interventions    Readmission Risk Interventions No flowsheet data found.

## 2020-11-07 NOTE — Evaluation (Signed)
Occupational Therapy Evaluation Patient Details Name: Chelsea [name] MRN: [id number] DOB: [birth year] Today's Date: 11/07/2020    History of Present Illness Chelsea [name] is a 75yo F who comes to [facility]  after continued weakness at home since DC from hospital and concerns of falls and about thriving at home. Pt sustained >10 falls in the last month. PMH: HTN, HLD, urinary incontinence, GAD. sizure d/o. 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 . Pt had a(+) COVID test during last admission on 10/21/20.   Clinical Impression   Chelsea [name] was seen for OT evaluation this date. Prior to hospital admission, pt was MIN A for mobility and ADLs. Pt lives alone in [place] - daughter reports pt recently d/c from hospital with family assistance at home, however pt continued to have ~10 falls in last week. Pt presents to acute OT demonstrating impaired ADL performance and functional mobility 2/2 decreased activity tolerance, functional strength/ROM/balance deficits, and poor insight into deficits. Pt currently requires MAX A for LBD seated EOB. MIN A x2 + RW for ADL t/f and ~16ft forward/backward x2 attempts. MIN A seated grooming tasks - assist for weight shift/sitting balance. Pt would benefit from skilled OT to address noted impairments and functional limitations (see below for any additional details) in order to maximize safety and independence while minimizing falls risk and caregiver burden. Upon hospital discharge, recommend STR to maximize pt safety and return to PLOF.     Follow Up Recommendations  SNF    Equipment Recommendations  Other (comment) (defer to next venue of care)    Recommendations for Other Services       Precautions / Restrictions Precautions Precautions: Fall Restrictions Weight Bearing Restrictions: No      Mobility Bed Mobility Overal bed mobility: Needs Assistance Bed Mobility: Supine to Sit;Sit to  Supine     Supine to sit: Mod assist;HOB elevated Sit to supine: Min guard        Transfers Overall transfer level: Needs assistance Equipment used: Rolling walker (2 wheeled) Transfers: Sit to/from Stand Sit to Stand: Min assist;+2 physical assistance;From elevated surface         General transfer comment: post lean in standing, VCs to correct    Balance Overall balance assessment: Needs assistance Sitting-balance support: Feet supported Sitting balance-Leahy Scale: Fair     Standing balance support: Bilateral upper extremity supported Standing balance-Leahy Scale: Poor                             ADL either performed or assessed with clinical judgement   ADL Overall ADL's : Needs assistance/impaired                                       General ADL Comments: MAX A for LBD seated EOB. MIN A x2 + RW for ADL t/f. MIN A seated grooming tasks - assist for weight shift/sitting balance                  Pertinent Vitals/Pain Pain Assessment: No/denies pain     Hand Dominance     Extremity/Trunk Assessment Upper Extremity Assessment Upper Extremity Assessment: RUE deficits/detail RUE Deficits / Details: decreased coordination and dexterity but demonstrates using in functional manner. Pt also reports sensation deficit and numbness   Lower Extremity Assessment Lower Extremity  Assessment: Generalized weakness       Communication Communication Communication: No difficulties   Cognition Arousal/Alertness: Awake/alert Behavior During Therapy: WFL for tasks assessed/performed Overall Cognitive Status: Within Functional Limits for tasks assessed                                 General Comments: VCs for safety and sequencing   General Comments       Exercises Exercises: Other exercises Other Exercises Other Exercises: Pt and daughter edcuated re: OT role, DME recs, d/c recs, falls prevention Other Exercises: LBD,  sup<>sit, sit<>stand x2, sitting/standing balance/tolerance, ~3 ft forward/backward x2   Shoulder Instructions      Home Living Family/patient expects to be discharged to:: Private residence Living Arrangements: Other relatives Available Help at Discharge: Family;Available 24 hours/day Type of Home: Independent living facility Home Access: Level entry     Home Layout: One level     Bathroom Shower/Tub: Walk-in shower         Home Equipment: Environmental consultant - 2 wheels;Wheelchair - manual;Cane - single point   Additional Comments: [facility]      Prior Functioning/Environment Level of Independence: Independent with assistive device(s)        Comments: Pt reports ~10 falls in last week. DTR reports pt recently admitted and d/c home with family visiting town to provide supervision however pt still having multiple falls daily.        OT Problem List: Decreased strength;Decreased activity tolerance;Impaired balance (sitting and/or standing);Decreased safety awareness;Impaired sensation;Impaired UE functional use;Cardiopulmonary status limiting activity;Decreased range of motion;Decreased coordination;Decreased knowledge of use of DME or AE;Decreased knowledge of precautions      OT Treatment/Interventions: Self-care/ADL training;Manual therapy;Therapeutic exercise;Neuromuscular education;Energy conservation;DME and/or AE instruction;Cognitive remediation/compensation;Therapeutic activities;Balance training;Patient/family education    OT Goals(Current goals can be found in the care plan section) Acute Rehab OT Goals Patient Stated Goal: to go home without falling OT Goal Formulation: With patient/family Time For Goal Achievement: 11/21/20 Potential to Achieve Goals: Fair ADL Goals Pt Will Perform Grooming: with set-up;with supervision;sitting Pt Will Perform Lower Body Dressing: with min assist;sitting/lateral leans Pt Will Transfer to Toilet: with min guard  assist;ambulating;bedside commode (c LRAD PRN)  OT Frequency: Min 1X/week           Co-evaluation   Reason for Co-Treatment: Complexity of the patient's impairments (multi-system involvement);For patient/therapist safety;To address functional/ADL transfers PT goals addressed during session: Mobility/safety with mobility;Balance;Proper use of DME OT goals addressed during session: ADL's and self-care;Proper use of Adaptive equipment and DME      AM-PAC OT "6 Clicks" Daily Activity     Outcome Measure Help from another person eating meals?: A Little Help from another person taking care of personal grooming?: A Little Help from another person toileting, which includes using toliet, bedpan, or urinal?: A Little Help from another person bathing (including washing, rinsing, drying)?: A Lot Help from another person to put on and taking off regular upper body clothing?: A Little Help from another person to put on and taking off regular lower body clothing?: A Lot 6 Click Score: 16   End of Session Equipment Utilized During Treatment: Rolling walker  Activity Tolerance: Patient tolerated treatment well Patient left: in bed;with family/visitor present;Other (comment) (in hallway)  OT Visit Diagnosis: Unsteadiness on feet (R26.81);Repeated falls (R29.6);Muscle weakness (generalized) (M62.81)                Time: [id number] OT  Time Calculation (min): 17 min Charges:  OT General Charges $OT Visit: 1 Visit OT Evaluation $OT Eval Moderate Complexity: 1 Mod OT Treatments $Self Care/Home Management : 8-22 mins  Dessie Coma, M.S. OTR/L  11/07/20, 4:34 PM  ascom [phone]

## 2020-11-07 NOTE — ED Provider Notes (Signed)
[facility] Emergency Department Provider Note  ____________________________________________   Event Date/Time   First MD Initiated Contact with Patient 11/07/20 1110     (approximate)  I have reviewed the triage vital signs and the nursing notes.   HISTORY  Chief Complaint Fall   HPI Chelsea [name] is a 75 y.o. 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  Patient states he tripped walking the bathroom.  He states he has been falling many times over the last couple weeks.  She states she did not pass out beforehand and did not hit her head.  She states she has some pain in her left shoulder, left elbow, left knee and left ankle.  She states she otherwise since being discharged has not been feeling sick with anything including fevers, chills, headache area, sore throat, cough, nausea, vomiting, diarrhea, dysuria, rash or any other acute sick symptoms.         Past Medical History:  Diagnosis Date  . Anxiety   . Arthritis   . Depression   . Hyperlipidemia   . Hypertension   . Seizures ([place])   . Spinal stenosis   . Ulcer   . Urinary incontinence     Patient Active Problem List   Diagnosis Date Noted  . Weakness 10/29/2020  . Age-related physical debility 10/28/2020  . Rhabdomyolysis 10/22/2020  . Right arm weakness 07/09/2020  . Right leg weakness 07/09/2020  . Urinary tract infection without hematuria 07/09/2020  . Abnormal urinalysis 07/09/2020  . Memory difficulties 07/09/2020  . Chronic anemia 09/26/2018  . History of gastritis 05/03/2018  .  History of Helicobacter pylori infection 05/03/2018  . Hx of adenomatous colonic polyps 05/03/2018  . BMI 40.0-44.9, adult ([place]) 03/15/2018  . Postmenopausal 03/15/2018  . Depression, major, recurrent, in partial remission ([place]) 01/11/2018  . Anxiety state 01/11/2018  . History of scoliosis 01/11/2018  . Pure hypercholesterolemia 01/11/2018  . Arthritis of big toe 10/19/2012  . Sinusitis 08/26/2012  . Physical exam, annual 05/16/2012  . Neck pain 04/11/2012  . Allergic conjunctivitis 06/23/2011  . Oral ulcer 06/23/2011  . GAD (generalized anxiety disorder) 05/14/2011  . Essential hypertension 02/06/2011  . Hyperlipidemia 02/06/2011  . Spinal stenosis of lumbosacral region 02/06/2011  . S/P breast biopsy 02/06/2011    Past Surgical History:  Procedure Laterality Date  . ABDOMINAL HYSTERECTOMY    . BACK SURGERY     due to polio  . BREAST BIOPSY Bilateral    neg  . BREAST BIOPSY Right 2011   neg/stereo  . CARPAL TUNNEL RELEASE    . CATARACT EXTRACTION Right 2020    Prior to Admission medications   Medication Sig Start Date End Date Taking? Authorizing Provider  ALPRAZolam Duanne Moron) 0.5 MG tablet Take 1 tablet (0.5 mg total) by mouth 3 (three) times daily as needed for anxiety. 11/01/20   Aline August, MD  aspirin 325 MG tablet Take 325 mg by mouth daily.    [provider]  atorvastatin (LIPITOR) 40 MG tablet Take 1 tablet (40 mg total) by mouth daily. 02/28/20   Malfi, Lupita Raider, FNP  Cyanocobalamin (VITAMIN B 12 PO) Take 500 mcg by mouth daily.  [provider]  FLUoxetine (PROZAC) 20 MG capsule TAKE 3 CAPSULES BY MOUTH EVERY DAY Patient taking differently: Take 60 mg by mouth daily. 10/01/20   Malfi, Lupita Raider, FNP  gabapentin (NEURONTIN) 100 MG capsule Take 2 capsules (200 mg total) by mouth 3 (three) times daily. 11/01/20   Aline August, MD  lidocaine (LIDODERM) 5 % Place 1 patch onto the skin daily. Remove & Discard patch within 12 hours or as directed  by MD 10/24/20   Max Sane, MD  traMADol (ULTRAM) 50 MG tablet Take 1 tablet (50 mg total) by mouth every 6 (six) hours as needed for moderate pain. 11/01/20   Aline August, MD  triamterene-hydrochlorothiazide (MAXZIDE-25) 37.5-25 MG tablet TAKE 1 TABLET BY MOUTH EVERY DAY 10/07/20   Verl Bangs, FNP  simvastatin (ZOCOR) 40 MG tablet TAKE 1 TABLET BY MOUTH EVERY DAY 10/30/19 02/28/20  Olin Hauser, DO    Allergies Penicillins  Family History  Problem Relation Age of Onset  . Arthritis Mother   . Heart disease Mother   . Stroke Mother   . Hypertension Mother   . COPD Mother   . Sudden death Sister   . Other Father        unknown medical history    Social History Social History   Tobacco Use  . Smoking status: Never Smoker  . Smokeless tobacco: Never Used  Vaping Use  . Vaping Use: Never used  Substance Use Topics  . Alcohol use: Never  . Drug use: No    Review of Systems  Review of Systems  Constitutional: Negative for chills and fever.  HENT: Negative for sore throat.   Eyes: Negative for pain.  Respiratory: Negative for cough and stridor.   Cardiovascular: Negative for chest pain.  Gastrointestinal: Negative for vomiting.  Genitourinary: Negative for dysuria.  Musculoskeletal: Positive for falls, joint pain ( L knee, L ankle, L shoulder) and myalgias ( L shoulder, L knee, L ankle).  Skin: Negative for rash.  Neurological: Positive for sensory change ( numbeness chronic in RUE and RLE) and weakness ( chronically in RUE and RLE). Negative for seizures, loss of consciousness and headaches.  Psychiatric/Behavioral: Negative for suicidal ideas.  All other systems reviewed and are negative.     ____________________________________________   PHYSICAL EXAM:  VITAL SIGNS: ED Triage Vitals  Enc Vitals Group     BP      Pulse      Resp      Temp      Temp src      SpO2      Weight      Height      Head Circumference      Peak Flow      Pain  Score      Pain Loc      Pain Edu?      Excl. in [place]?    Vitals:   11/07/20 1128  BP: 109/72  Pulse: 92  Resp: (!) 24  Temp: 99.5 F (37.5 C)  SpO2: 98%   Physical Exam Vitals and nursing note reviewed.  Constitutional:      General: She is not in acute distress.    Appearance: She is well-developed and well-nourished. She is obese.  HENT:     Head: Normocephalic and atraumatic.     Right Ear: External ear normal.     Left Ear: External ear normal.     Nose: Nose normal.  Eyes:  Conjunctiva/sclera: Conjunctivae normal.  Cardiovascular:     Rate and Rhythm: Normal rate and regular rhythm.     Heart sounds: No murmur heard.   Pulmonary:     Effort: Pulmonary effort is normal. No respiratory distress.     Breath sounds: Normal breath sounds.  Abdominal:     Palpations: Abdomen is soft.     Tenderness: There is no abdominal tenderness.  Musculoskeletal:        General: No edema.     Cervical back: Neck supple.  Skin:    General: Skin is warm and dry.  Neurological:     Mental Status: She is alert and oriented to person, place, and time.  Psychiatric:        Mood and Affect: Mood and affect and mood normal.     2+ bilateral radial and DP pulses.  No tenderness step-offs or deformities over the C/T/L-spine.  Cranial nerves II through XII grossly intact.  There are some mild tenderness of the anterior aspect the left shoulder and anterior aspect of the left knee and lateral aspect of the left ankle.  No large effusion or deformities over the extremities.  Patient is slightly weaker on plantar dorsiflexion of the left foot and flexion extension of left knee compared to the right but otherwise seems to have symmetric strength in her left upper extremity. ____________________________________________   LABS (all labs ordered are listed, but only abnormal results are displayed)  Labs Reviewed  CBC WITH DIFFERENTIAL/PLATELET - Abnormal; Notable for the following  components:      Result Value   WBC 14.4 (*)    RBC 3.53 (*)    Hemoglobin 10.8 (*)    HCT 33.2 (*)    Neutro Abs 10.9 (*)    Monocytes Absolute 1.6 (*)    Abs Immature Granulocytes 0.08 (*)    All other components within normal limits  BASIC METABOLIC PANEL - Abnormal; Notable for the following components:   Potassium 3.3 (*)    Glucose, Bld 106 (*)    All other components within normal limits  URINALYSIS, COMPLETE (UACMP) WITH MICROSCOPIC   ____________________________________________  EKG  Sinus rhythm with a ventricular rate of 91, normal axis, unremarkable intervals, no acute ischemia or other significant arrhythmia. ____________________________________________  RADIOLOGY  ED MD interpretation: CT head shows no evidence of skull fracture intracranial hemorrhage or acute intracranial process.  C-spine shows no fracture dislocation.  Chest x-ray shows no interval fracture pneumothorax pneumonia or other clear acute intrathoracic process.  Plain films of the patient's left elbow and left ankle show no acute fracture dislocation or effusion.  Official radiology report(s): DG Chest 2 View  Result Date: 11/07/2020 CLINICAL DATA:  Fall EXAM: CHEST - 2 VIEW COMPARISON:  October 22, 2020 FINDINGS: The cardiomediastinal silhouette is unchanged in contour. Tortuous thoracic aorta.Calcified granuloma of the LEFT mid lung, unchanged. No pleural effusion. No pneumothorax. No acute pleuroparenchymal abnormality. Visualized abdomen is unremarkable. Unchanged chronic deformity of the mid thoracic spine. IMPRESSION: No acute cardiopulmonary abnormality. Electronically Signed   By: Valentino Saxon MD   On: 11/07/2020 12:13   DG Elbow Complete Left  Result Date: 11/07/2020 CLINICAL DATA:  Fall EXAM: LEFT ELBOW - COMPLETE 3+ VIEW COMPARISON:  None. FINDINGS: No acute fracture or dislocation. Joint spaces and alignment are maintained. No area of erosion or osseous destruction. No unexpected  radiopaque foreign body. Soft tissues are unremarkable. IMPRESSION: No acute fracture or dislocation. Electronically Signed   By: Valentino Saxon MD  On: 11/07/2020 12:15   DG Ankle Complete Left  Result Date: 11/07/2020 CLINICAL DATA:  Fall EXAM: LEFT ANKLE COMPLETE - 3+ VIEW COMPARISON:  None. FINDINGS: No acute fracture or dislocation. Ankle mortise is preserved. Enthesophyte of the Achilles tendon. Midfoot degenerative changes. No area of erosion or osseous destruction. No unexpected radiopaque foreign body. Soft tissues are unremarkable. IMPRESSION: No acute fracture or dislocation. Electronically Signed   By: Valentino Saxon MD   On: 11/07/2020 12:14   CT Head Wo Contrast  Result Date: 11/07/2020 CLINICAL DATA:  Multiple falls EXAM: CT HEAD WITHOUT CONTRAST CT CERVICAL SPINE WITHOUT CONTRAST TECHNIQUE: Multidetector CT imaging of the head and cervical spine was performed following the standard protocol without intravenous contrast. Multiplanar CT image reconstructions of the cervical spine were also generated. COMPARISON:  10/28/2020 FINDINGS: CT HEAD FINDINGS Brain: No evidence of acute infarction, hemorrhage, hydrocephalus, extra-axial collection or mass lesion/mass effect. Periventricular and deep white matter hypodensity. Vascular: No hyperdense vessel or unexpected calcification. Skull: Normal. Negative for fracture or focal lesion. Sinuses/Orbits: No acute finding. Other: None. CT CERVICAL SPINE FINDINGS Alignment: Normal. Skull base and vertebrae: No acute fracture. No primary bone lesion or focal pathologic process. Soft tissues and spinal canal: No prevertebral fluid or swelling. No visible canal hematoma. Disc levels: Moderate multilevel disc space height loss and osteophytosis. Upper chest: Negative. Other: None. IMPRESSION: 1. No acute intracranial pathology. Small-vessel white matter disease. 2. No fracture or static subluxation of the cervical spine. Moderate multilevel disc space  height loss and osteophytosis. Electronically Signed   By: Eddie Candle M.D.   On: 11/07/2020 12:28   CT Cervical Spine Wo Contrast  Result Date: 11/07/2020 CLINICAL DATA:  Multiple falls EXAM: CT HEAD WITHOUT CONTRAST CT CERVICAL SPINE WITHOUT CONTRAST TECHNIQUE: Multidetector CT imaging of the head and cervical spine was performed following the standard protocol without intravenous contrast. Multiplanar CT image reconstructions of the cervical spine were also generated. COMPARISON:  10/28/2020 FINDINGS: CT HEAD FINDINGS Brain: No evidence of acute infarction, hemorrhage, hydrocephalus, extra-axial collection or mass lesion/mass effect. Periventricular and deep white matter hypodensity. Vascular: No hyperdense vessel or unexpected calcification. Skull: Normal. Negative for fracture or focal lesion. Sinuses/Orbits: No acute finding. Other: None. CT CERVICAL SPINE FINDINGS Alignment: Normal. Skull base and vertebrae: No acute fracture. No primary bone lesion or focal pathologic process. Soft tissues and spinal canal: No prevertebral fluid or swelling. No visible canal hematoma. Disc levels: Moderate multilevel disc space height loss and osteophytosis. Upper chest: Negative. Other: None. IMPRESSION: 1. No acute intracranial pathology. Small-vessel white matter disease. 2. No fracture or static subluxation of the cervical spine. Moderate multilevel disc space height loss and osteophytosis. Electronically Signed   By: Eddie Candle M.D.   On: 11/07/2020 12:28   DG Knee Complete 4 Views Left  Result Date: 11/07/2020 CLINICAL DATA:  Fall EXAM: LEFT KNEE - COMPLETE 4+ VIEW COMPARISON:  None. FINDINGS: No acute fracture or dislocation. Mild degenerative changes of the lateral compartment. Enthesophyte of the quadriceps tendon and patellar tendon insertions on the patella. No area of erosion or osseous destruction. No unexpected radiopaque foreign body. Soft tissues are unremarkable. IMPRESSION: No acute fracture or  dislocation. Electronically Signed   By: Valentino Saxon MD   On: 11/07/2020 12:13    ____________________________________________   PROCEDURES  Procedure(s) performed (including Critical Care):  Procedures   ____________________________________________   INITIAL IMPRESSION / ASSESSMENT AND PLAN / ED COURSE      Patient presents with [place] to  history exam for assessment after a mechanical fall at her living facility which seems to be independent facility.  She is afebrile hemodynamically stable arrival.  Exam as above.  Likely contusion to the patient's left shoulder left knee and ankle.  No evidence of fracture dislocation on imaging.  Patient is otherwise neurovascular intact in all extremities and I have very low suspicion for occult orthopedic injury or other significant visceral injury at this time.  Patient denies any acute sick symptoms and there are no other historical or exam findings suggest acute infectious process at this time.  We will plan to obtain CBC BMP and UA to assess for any embolic arrangements for occult UA that could be contributing to patient's stability although she will likely require OT PT consult with plan for placement in a SNF as this was a recommendation by PT last time she was in the emergency room although she was only able to go to assisted living with home health last time.  Do not believe this is safe for the patient at this time but she has demonstrated she has fallen multiple times over the past month and likely requires a higher level of care.  PT and OT consults.  Social worker consult placed due to likely need for SNF placement.  CBC shows mild leukocytosis which is likely reactive in the setting of some trauma.  Choking hemoglobin is at baseline.  Evidence of acute anemia.  BMP shows no significant metabolic derangements.     ____________________________________________   FINAL CLINICAL IMPRESSION(S) / ED DIAGNOSES  Final diagnoses:  Fall,  initial encounter  Frail elderly  Unstable ankle, unspecified laterality  Contusion of left shoulder, initial encounter  Contusion of left knee, initial encounter  Contusion of left ankle, initial encounter    Medications  gabapentin (NEURONTIN) capsule 200 mg (has no administration in time range)  atorvastatin (LIPITOR) tablet 40 mg (has no administration in time range)  ALPRAZolam (XANAX) tablet 0.5 mg (has no administration in time range)  traMADol (ULTRAM) tablet 50 mg (has no administration in time range)  triamterene-hydrochlorothiazide (MAXZIDE-25) 37.5-25 MG per tablet 1 tablet (has no administration in time range)  acetaminophen (TYLENOL) tablet 1,000 mg (1,000 mg Oral Given 11/07/20 1227)     ED Discharge Orders    None       Note:  This document was prepared using Dragon voice recognition software and may include unintentional dictation errors.   Lucrezia Starch, MD 11/07/20 1330

## 2020-11-07 NOTE — ED Notes (Signed)
First RN note:  Pt comes into the ED via [facility] c/o bilateral shoulder pain and back pain s/p multiple falls yesterday. Per EMS they are mechanical falls. Pt refused EMS transport yesterday for the falls.  Vitals stable at 113/65, 100%, 100 HR

## 2020-11-07 NOTE — ED Notes (Signed)
Pt awake and alert on her cellphone talking.  RR even and unlabored on RA with symmetrical rise and fall of chest.  No acute distress noted.  Will monitor for acute changes and maintain plan of care as pt awaits placement.

## 2020-11-07 NOTE — ED Notes (Signed)
Pt now resting with eyes closed; RR even and unlabored on RA; no acute distress noted.

## 2020-11-07 NOTE — ED Notes (Signed)
Pt unable to urinate.  Requested sprite to help.  Sprite given.

## 2020-11-07 NOTE — ED Notes (Signed)
Xanax 0.5mg  prn administered per pt request this evening -- pt remains awake and alert- no acute changes noted.

## 2020-11-07 NOTE — ED Triage Notes (Signed)
Pt comes via EMS from  [facility] with c/o fall. Pt states left shoulder and leg pain.  Pt denies hitting head or any LOC. Pt on blod thinners.

## 2020-11-07 NOTE — Evaluation (Signed)
Physical Therapy Evaluation Patient Details Name: Chelsea [name] MRN: [id number] DOB: October 03, 1946 Today's Date: 11/07/2020   History of Present Illness  Chelsea [name] is a 75yo F who comes to [facility]  after continued weakness at home since DC from hospital and concerns of falls and about thriving at home. Pt sustained >10 falls in the last month. PMH: HTN, HLD, urinary incontinence, GAD. sizure d/o. 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 . Pt had a(+) COVID test during last admission on 10/21/20.    Clinical Impression  Pt alert, family at bedside. Denied pain at rest, did speak of her recent hospital visits and frustration over her current functional status. Family at bedside assisted with providing PLOF as able. Pt normally lives alone, and until about 1 month ago was modI/I for ADLs and mobility.  The patient demonstrated supine to sit with modA for LE assist and weight shift. Fair sitting balance noted with feet unsupported. OT assisted with mobility; able to sit <> Stand with two person handheld assist and minAx2. Second attempt with RW, pt required at least minA due to posterior lean noted in standing, able to correct with verbal and tactile cueing. She was able to ambulate ~4ft twice forwards/backwards with RW and minAx2 for safety. Pt did exhibit fatigue at end of session as well as minA to re-adjust on elevated surface.  Overall the patient demonstrated deficits (see "PT Problem List") that impede the patient's functional abilities, safety, and mobility and would benefit from skilled PT intervention. Recommendation is SNF at this time due to decline in functional mobility and lack of caregiver support. Family and pt agreeable.    Follow Up Recommendations SNF    Equipment Recommendations  Other (comment) (TBD at next venue of care)    Recommendations for Other Services       Precautions / Restrictions  Precautions Precautions: Fall Restrictions Weight Bearing Restrictions: No      Mobility  Bed Mobility Overal bed mobility: Needs Assistance Bed Mobility: Supine to Sit;Sit to Supine     Supine to sit: Mod assist;HOB elevated Sit to supine: Min guard        Transfers Overall transfer level: Needs assistance Equipment used: Rolling walker (2 wheeled) Transfers: Sit to/from Stand Sit to Stand: Min assist;+2 physical assistance;From elevated surface         General transfer comment: post lean in standing, VCs to correct  Ambulation/Gait Ambulation/Gait assistance: Min assist Gait Distance (Feet): 5 Feet (50ft x2) Assistive device: Rolling walker (2 wheeled) Gait Pattern/deviations: Step-to pattern;Decreased step length - right;Decreased step length - left;Shuffle;Trunk flexed Gait velocity: decr   General Gait Details: pt ambulated with off balance gait, post lean and quick fatigue. Needs hands on assist to prevent fall  Stairs            Wheelchair Mobility    Modified Rankin (Stroke Patients Only)       Balance Overall balance assessment: Needs assistance Sitting-balance support: Feet supported Sitting balance-Leahy Scale: Fair     Standing balance support: Bilateral upper extremity supported Standing balance-Leahy Scale: Poor Standing balance comment: patient requires min guard and B UE support with standing                             Pertinent Vitals/Pain Pain Assessment: No/denies pain    Home Living Family/patient expects to be discharged to:: Private residence Living Arrangements:  Other relatives Available Help at Discharge: Family;Available 24 hours/day Type of Home: Independent living facility Home Access: Level entry     Home Layout: One level Home Equipment: Walker - 2 wheels;Wheelchair - manual;Cane - single point Additional Comments: [facility] living    Prior Function Level of Independence: Independent  with assistive device(s)         Comments: Pt reports ~10 falls in last week. DTR reports pt recently admitted and d/c home with family visiting town to provide supervision however pt still having multiple falls daily.     Hand Dominance        Extremity/Trunk Assessment   Upper Extremity Assessment Upper Extremity Assessment: Defer to OT evaluation RUE Deficits / Details: decreased coordination and dexterity but demonstrates using in functional manner. Pt also reports sensation deficit and numbness    Lower Extremity Assessment Lower Extremity Assessment: Generalized weakness;RLE deficits/detail;LLE deficits/detail RLE Deficits / Details: able to lift against gravity but more effortful for patient than LLE LLE Deficits / Details: able to move against gravity freely       Communication   Communication: No difficulties  Cognition Arousal/Alertness: Awake/alert Behavior During Therapy: WFL for tasks assessed/performed Overall Cognitive Status: Within Functional Limits for tasks assessed                                 General Comments: VCs for safety and sequencing      General Comments      Exercises Other Exercises Other Exercises: Pt and daughter edcuated re: OT role, DME recs, d/c recs, falls prevention Other Exercises: sup<>sit, sit<>stand x2 with two person to assist (first attempt without RW, second attempt with RW), sitting/standing balance/tolerance, ~5 ft forward/backward x2   Assessment/Plan    PT Assessment Patient needs continued PT services  PT Problem List Decreased activity tolerance;Decreased strength;Decreased balance;Decreased knowledge of use of DME;Decreased safety awareness       PT Treatment Interventions DME instruction;Gait training;Therapeutic activities;Therapeutic exercise;Functional mobility training;Balance training;Patient/family education;Neuromuscular re-education    PT Goals (Current goals can be found in the Care  Plan section)  Acute Rehab PT Goals Patient Stated Goal: to go home without falling PT Goal Formulation: With patient Time For Goal Achievement: 11/21/20 Potential to Achieve Goals: Fair    Frequency Min 2X/week   Barriers to discharge        Co-evaluation PT/OT/SLP Co-Evaluation/Treatment: Yes Reason for Co-Treatment: Complexity of the patient's impairments (multi-system involvement);Necessary to address cognition/behavior during functional activity;To address functional/ADL transfers PT goals addressed during session: Mobility/safety with mobility;Balance;Proper use of DME OT goals addressed during session: ADL's and self-care;Proper use of Adaptive equipment and DME       AM-PAC PT "6 Clicks" Mobility  Outcome Measure Help needed turning from your back to your side while in a flat bed without using bedrails?: A Little Help needed moving from lying on your back to sitting on the side of a flat bed without using bedrails?: A Lot Help needed moving to and from a bed to a chair (including a wheelchair)?: A Lot Help needed standing up from a chair using your arms (e.g., wheelchair or bedside chair)?: A Little Help needed to walk in hospital room?: A Lot Help needed climbing 3-5 steps with a railing? : Total 6 Click Score: 13    End of Session Equipment Utilized During Treatment: Gait belt Activity Tolerance: Patient tolerated treatment well;Patient limited by fatigue Patient left: in  chair;with chair alarm set Nurse Communication: Mobility status PT Visit Diagnosis: Muscle weakness (generalized) (M62.81);Difficulty in walking, not elsewhere classified (R26.2);Pain Pain - Right/Left: Right    Time: [id number] PT Time Calculation (min) (ACUTE ONLY): 40 min   Charges:   PT Evaluation $PT Eval Low Complexity: 1 Low PT Treatments $Therapeutic Exercise: 8-22 mins       Lieutenant Diego PT, DPT 4:41 PM,11/07/20

## 2020-11-07 NOTE — NC FL2 (Addendum)
[place] LEVEL OF CARE SCREENING TOOL     IDENTIFICATION  Patient Name: Chelsea [name] Birthdate: [birth year] Sex: female Admission Date (Current Location): 11/07/2020  [place] and [place] Number:  Engineering geologist and Address:  [facility], [address] [place], [place] [address]      Provider Number: [id number]  Attending Physician Name and Address:  Lucrezia Starch, MD  Relative Name and Phone Number:  [name] (Daughter)   [phone]    Current Level of Care: Hospital Recommended Level of Care: [place] Prior Approval Number:    Date Approved/Denied:   PASRR Number: SS# [id number]  Discharge Plan: SNF    Current Diagnoses: Patient Active Problem List   Diagnosis Date Noted  . Weakness 10/29/2020  . Age-related physical debility 10/28/2020  . Rhabdomyolysis 10/22/2020  . Right arm weakness 07/09/2020  . Right leg weakness 07/09/2020  . Urinary tract infection without hematuria 07/09/2020  . Abnormal urinalysis 07/09/2020  . Memory difficulties 07/09/2020  . Chronic anemia 09/26/2018  . History of gastritis 05/03/2018  . History of Helicobacter pylori infection 05/03/2018  . Hx of adenomatous colonic polyps 05/03/2018  . BMI 40.0-44.9, adult ([place]) 03/15/2018  . Postmenopausal 03/15/2018  . Depression, major, recurrent, in partial remission ([place]) 01/11/2018  . Anxiety state 01/11/2018  . History of scoliosis 01/11/2018  . Pure hypercholesterolemia 01/11/2018  . Arthritis of big toe 10/19/2012  . Sinusitis 08/26/2012  . Physical exam, annual 05/16/2012  . Neck pain 04/11/2012  . Allergic conjunctivitis 06/23/2011  . Oral ulcer 06/23/2011  . GAD (generalized anxiety disorder) 05/14/2011  . Essential hypertension 02/06/2011  . Hyperlipidemia 02/06/2011  . Spinal stenosis of lumbosacral region 02/06/2011  . S/P breast biopsy 02/06/2011    Orientation RESPIRATION BLADDER Height &  Weight     Self,Time,Situation,Place  Normal Continent Weight:   Height:     BEHAVIORAL SYMPTOMS/MOOD NEUROLOGICAL BOWEL NUTRITION STATUS      Continent Diet  AMBULATORY STATUS COMMUNICATION OF NEEDS Skin   Limited Assist Verbally Normal                       Personal Care Assistance Level of Assistance  Bathing,Feeding,Dressing,Total care Bathing Assistance: Limited assistance Feeding assistance: Independent Dressing Assistance: Limited assistance Total Care Assistance: Limited assistance   Functional Limitations Info  Sight,Speech,Hearing Sight Info: Adequate Hearing Info: Adequate Speech Info: Adequate    SPECIAL CARE FACTORS FREQUENCY       PT Min 5X per week  OT Min 1X per week                Contractures Contractures Info: Not present    Additional Factors Info                  Current Medications (11/07/2020):  This is the current hospital active medication list Current Facility-Administered Medications  Medication Dose Route Frequency Provider Last Rate Last Admin  . ALPRAZolam Duanne Moron) tablet 0.5 mg  0.5 mg Oral TID PRN Lucrezia Starch, MD      . atorvastatin (LIPITOR) tablet 40 mg  40 mg Oral Daily Lucrezia Starch, MD      . gabapentin (NEURONTIN) capsule 200 mg  200 mg Oral TID Lucrezia Starch, MD      . traMADol Veatrice Bourbon) tablet 50 mg  50 mg Oral Q6H PRN Lucrezia Starch, MD      . Derrill Memo ON 11/08/2020] triamterene-hydrochlorothiazide (MAXZIDE-25) 37.5-25  MG per tablet 1 tablet  1 tablet Oral Daily Lucrezia Starch, MD       Current Outpatient Medications  Medication Sig Dispense Refill  . ALPRAZolam (XANAX) 0.5 MG tablet Take 1 tablet (0.5 mg total) by mouth 3 (three) times daily as needed for anxiety. 10 tablet 0  . aspirin 325 MG tablet Take 325 mg by mouth daily.    Marland Kitchen atorvastatin (LIPITOR) 40 MG tablet Take 1 tablet (40 mg total) by mouth daily. 90 tablet 3  . FLUoxetine (PROZAC) 20 MG capsule TAKE 3 CAPSULES BY MOUTH EVERY DAY (Patient  taking differently: Take 60 mg by mouth daily.) 270 capsule 1  . gabapentin (NEURONTIN) 100 MG capsule Take 2 capsules (200 mg total) by mouth 3 (three) times daily. 30 capsule 0  . lidocaine (LIDODERM) 5 % Place 1 patch onto the skin daily. Remove & Discard patch within 12 hours or as directed by MD 1 patch 0  . traMADol (ULTRAM) 50 MG tablet Take 1 tablet (50 mg total) by mouth every 6 (six) hours as needed for moderate pain. 10 tablet 0  . triamterene-hydrochlorothiazide (MAXZIDE-25) 37.5-25 MG tablet TAKE 1 TABLET BY MOUTH EVERY DAY 90 tablet 0  . Cyanocobalamin (VITAMIN B 12 PO) Take 500 mcg by mouth daily.       Discharge Medications: Please see discharge summary for a list of discharge medications.  Relevant Imaging Results:  Relevant Lab Results:   Additional Information SS# [id number]; Patient will private pay for SNF if necessary.  Dutch Ing, LCSWA

## 2020-11-08 MED ORDER — DOCUSATE SODIUM 100 MG PO CAPS
100.0000 mg | ORAL_CAPSULE | Freq: Once | ORAL | Status: AC
  Administered 2020-11-08: 100 mg via ORAL
  Filled 2020-11-08: qty 1

## 2020-11-08 NOTE — ED Notes (Signed)
Pt moved into room 8. Full linen changed by this RN and Darrold Span. Brief saturated with urine. Pericare performed. Clean brief and chux pad placed. External urinary cath placed per pt request. Pt repositioned in bed and covered with warm blankets. Lights turned off per request for rest. Pt requesting tylenol at this time.
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
## 2020-11-08 NOTE — Progress Notes (Signed)
[facility] Room ED08 [facility]) Hospital Liaison RN note:  This is a pending out patient palliative patient with Manufacturing engineer. Teresita Maxey, TOC is aware.  [place] Liaison will follow for disposition.   Thank you.  Zandra Abts, RN [facility] Liaison  [phone]

## 2020-11-08 NOTE — ED Notes (Signed)
Pt ambulatory to the bedside toilet, pt used walker with standby assist. Pt assisted back to bed, clean sheets placed on bed, new purewick placed onto patient.

## 2020-11-08 NOTE — TOC Progression Note (Signed)
Transition of Care [facility]) - Progression Note    Patient Details  Name: Chelsea [name] MRN: [id number] Date of Birth: 09-Apr-1946  Transition of Care [facility]) CM/SW [place], [place] Phone Number: [phone] 11/08/2020, 3:42 PM  Clinical Narrative:     Patient's [name] (Daughter) [phone], sent CSW letter from [facility] with claim resolution and end date.  CSW sent this to Debbie at [facility] for confirmation this is the information she needed.     Barriers to Discharge: SNF Pending bed offer  Expected Discharge Plan and Services                                                 Social Determinants of Health (SDOH) Interventions    Readmission Risk Interventions No flowsheet data found.

## 2020-11-09 LAB — URINALYSIS, COMPLETE (UACMP) WITH MICROSCOPIC
Bacteria, UA: NONE SEEN
Bilirubin Urine: NEGATIVE
Glucose, UA: NEGATIVE mg/dL
Ketones, ur: NEGATIVE mg/dL
Nitrite: NEGATIVE
Protein, ur: NEGATIVE mg/dL
Specific Gravity, Urine: 1.012 (ref 1.005–1.030)
pH: 7 (ref 5.0–8.0)

## 2020-11-09 NOTE — ED Notes (Signed)
Pt currently resting at this time. NAD noted. Call bell in reach.

## 2020-11-09 NOTE — TOC Progression Note (Addendum)
Transition of Care [facility]) - Progression Note    Patient Details  Name: Chelsea [name] MRN: [id number] Date of Birth: [birth year]  Transition of Care [facility]) CM/SW [place], LCSW Phone Number: 11/09/2020, 11:06 AM  Update: 11/09/2020 11:18  CSW received call from [place] from [facility] [place]. She stated that she received all necessary paper work late Friday evening.  She sent all paper work to their cooperate office.  She expect response on Monday.  Patient tentative discharge Monday 11/11/2020.    Clinical Narrative:   CSW call [place], [place]. [phone] Waiting for call.   Plan is for this patient to discharge to [facility].  Waiting for insurance auth however family is willing to private pay.     - CSW received call from the patient's daughter [name], daughter, [phone]. CSW updated daughter on barriers to discharge. Daughter is planning on taking a drive to [place] today. CSW reminded daughter that COVID restriction might be in place.      Barriers to Discharge: SNF Pending bed offer  Expected Discharge Plan and Services                                                 Social Determinants of Health (SDOH) Interventions    Readmission Risk Interventions No flowsheet data found.

## 2020-11-09 NOTE — ED Notes (Signed)
Pt had a BM, pt cleaned up. New brief and purewick applied, Pt escorted to bedside toilet with walker.

## 2020-11-10 NOTE — ED Notes (Signed)
Pt resting.

## 2020-11-10 NOTE — ED Notes (Signed)
Patient is resting comfortably. 

## 2020-11-10 NOTE — ED Notes (Signed)
Report to reina, rn.  

## 2020-11-10 NOTE — ED Notes (Signed)
Provided blankets, gingerale, ice, [place] sandwich tray.

## 2020-11-10 NOTE — Social Work (Signed)
TOC CM/SW received fax from the patient's son Chelsea [name].  FMLA form - Request for attending to fill out FMLA form.    Mr. Chelsea [name] needs to proof that his mother is ill.  He will need time off from work to make arrangements for his mother.

## 2020-11-10 NOTE — ED Notes (Signed)
Pt resting. Call bell at right side, bed in low and locked position.

## 2020-11-10 NOTE — ED Notes (Signed)
Pt complaining of shoulder pain and anxiety.

## 2020-11-10 NOTE — ED Notes (Signed)
Provided fresh ice and gingerale and sandwich tray. Pt sitting at side of bed eating.

## 2020-11-10 NOTE — ED Notes (Signed)
Helped pt walk to toilet in room using walker. Pt had large BM. Pt back in bed. Purewick changed.

## 2020-11-10 NOTE — ED Notes (Signed)
Pt ringing call bell. States she does not need anything, but wanted a visitor in room. Pt informed need a temp, but pt is drinking right now.

## 2020-11-11 NOTE — ED Notes (Signed)
meds given   Pt watching tv 

## 2020-11-11 NOTE — Progress Notes (Signed)
Physical Therapy Treatment Patient Details Name: ICESS [name] MRN: [id number] DOB: Mar 02, 1946 Today's Date: 11/11/2020    History of Present Illness Chelsea [name] is a 75yo F who comes to [facility]  after continued weakness at home since DC from hospital and concerns of falls and about thriving at home. Pt sustained >10 falls in the last month. PMH: HTN, HLD, urinary incontinence, GAD. sizure d/o. 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 . Pt had a(+) COVID test during last admission on 10/21/20.    PT Comments    Patient alert, in bed, bed pan in place. Eager to attempt to mobilize to try to have BM on commode in room instead. She was able to perform supine to sit with HOB elevated, light minA. Sit <> stand from EOB and from commode in room. EOB with RW CGA, but from lower surface, pt reliant on grab bar and very light minA to stand from commode. minA for pericare. She ambulated a total of 52ft twice, (seated rest break on commode between 34ft) with RW and CGA. Wide base of support noted with flexed trunk, decreased gait velocity. Pt up in recliner at end of session, assisted with lunch set up. Overall the patient demonstrated improvement in mobility and remains very motivated. Discharge recommendations updated to HHPT with supervision/assistance 24/7 for safety as well as decreased tolerance. If unable to obtain 24/7 supervision/assistance, pt would benefit from SNF to maximize function and safety.     Follow Up Recommendations  SNF;Home health PT;Supervision/Assistance - 24 hour     Equipment Recommendations  Other (comment) (TBD at next venue of care)    Recommendations for Other Services       Precautions / Restrictions Precautions Precautions: Fall Restrictions Weight Bearing Restrictions: No    Mobility  Bed Mobility Overal bed mobility: Needs Assistance Bed Mobility: Supine to Sit     Supine to sit: Min  assist;HOB elevated        Transfers Overall transfer level: Needs assistance Equipment used: Rolling walker (2 wheeled) Transfers: Sit to/from Stand Sit to Stand: Min guard;Min assist         General transfer comment: CGA from EOB, light minA from standard commode ( and heavy use of grab bar)  Ambulation/Gait Ambulation/Gait assistance: Min guard Gait Distance (Feet):  (34ft, twice) Assistive device: Rolling walker (2 wheeled)   Gait velocity: decr   General Gait Details: wide base of support, flexed trunk.   Stairs             Wheelchair Mobility    Modified Rankin (Stroke Patients Only)       Balance Overall balance assessment: Needs assistance Sitting-balance support: Feet supported Sitting balance-Leahy Scale: Fair     Standing balance support: Bilateral upper extremity supported Standing balance-Leahy Scale: Poor Standing balance comment: reliant on UE support for standing                            Cognition Arousal/Alertness: Awake/alert Behavior During Therapy: WFL for tasks assessed/performed Overall Cognitive Status: Within Functional Limits for tasks assessed                                 General Comments: pt needed verbal cues for safety, but overall eager to mobilize      Exercises Other Exercises Other Exercises: Pt able  to sit on commode in room for several long minutes, supervision. minA to assist with pericare Other Exercises: Pt up in recliner all needs in reach. Set up for lunch and assisted with opening items, sandwich assembly, etc    General Comments        Pertinent Vitals/Pain Pain Assessment: No/denies pain    Home Living                      Prior Function            PT Goals (current goals can now be found in the care plan section) Progress towards PT goals: Progressing toward goals    Frequency    Min 2X/week      PT Plan Discharge plan needs to be updated     Co-evaluation              AM-PAC PT "6 Clicks" Mobility   Outcome Measure  Help needed turning from your back to your side while in a flat bed without using bedrails?: A Little Help needed moving from lying on your back to sitting on the side of a flat bed without using bedrails?: A Little Help needed moving to and from a bed to a chair (including a wheelchair)?: A Little Help needed standing up from a chair using your arms (e.g., wheelchair or bedside chair)?: A Little Help needed to walk in hospital room?: A Little Help needed climbing 3-5 steps with a railing? : Total 6 Click Score: 16    End of Session Equipment Utilized During Treatment: Gait belt Activity Tolerance: Patient tolerated treatment well Patient left: in chair;with call bell/phone within reach Nurse Communication: Mobility status PT Visit Diagnosis: Muscle weakness (generalized) (M62.81);Difficulty in walking, not elsewhere classified (R26.2);Pain Pain - Right/Left: Right Pain - part of body: Leg     Time: 1140-1211 PT Time Calculation (min) (ACUTE ONLY): 31 min  Charges:  $Therapeutic Exercise: 8-22 mins $Therapeutic Activity: 8-22 mins                     Lieutenant Diego PT, DPT 1:34 PM,11/11/20

## 2020-11-11 NOTE — ED Notes (Signed)
Patient is resting comfortably. 

## 2020-11-11 NOTE — ED Notes (Signed)
Pt eating dinner tray °

## 2020-11-11 NOTE — ED Notes (Signed)
Resumed care from brianna rn.  Pt sleeping.

## 2020-11-11 NOTE — ED Notes (Addendum)
Pt provided ginger ale as requested Pt eating lunch at this time

## 2020-11-11 NOTE — TOC Progression Note (Signed)
Transition of Care [facility]) - Progression Note    Patient Details  Name: Chelsea [name] MRN: [id number] Date of Birth: 13-Sep-1946  Transition of Care [facility]) CM/SW [place], [place] Phone Number: [phone] 11/11/2020, 9:59 AM  Clinical Narrative:     CSW contacted [facility] and left message for [employer] coordinator requesting insurance auth update.  Jackelyn Poling is not there today and there is noone else available to give update on status.    Barriers to Discharge: SNF Pending bed offer  Expected Discharge Plan and Services                                                 Social Determinants of Health (SDOH) Interventions    Readmission Risk Interventions No flowsheet data found. --

## 2020-11-11 NOTE — ED Notes (Signed)
Pt on cell phone.  Pt alert.

## 2020-11-11 NOTE — ED Notes (Signed)
Pt assisted from recliner to bed. purewick in place. Call bell within reach

## 2020-11-11 NOTE — ED Notes (Signed)
meds given  Eating meal and watching tv

## 2020-11-11 NOTE — ED Notes (Signed)
Introduced self to pt. Pt alert and oriented. Provided warm washcloth to wash face as requested.  Call bell within reach

## 2020-11-11 NOTE — ED Notes (Signed)
This RN to bedside to answer call bell, pt stated that she needed to go to the toilet for a BM. This RN explained that was uncomfortable getting pt up and felt that it was unsafe d/t pt's nature of being here and limited mobility while laying in bed (pt unable to lift self up in bed).  This RN along with Radonna Ricker RN assisted pt on to bed pan. Pt heard talking on phone stating that she needed to speak with a Education officer, museum and that she was tired of being left places. This RN explained will be back in less than 10 mins to take off bed pan and also gave call bell to call when finished. Pt stating "little girl you dont know what youre talking about, I want to speak to your charge RN".  This RN along with LauraRN returned to room to take off bed pan, PT was at bedside and had already taken off bed pan and was assisting pt. This RN brought lunch tray, pt refused without seeing what is in lunch tray.  Heather RN informed that pt wants to speak to charge RN

## 2020-11-11 NOTE — ED Notes (Signed)
Patient resting comfortably with no acute distress noted. Denies needs at this time.

## 2020-11-11 NOTE — ED Provider Notes (Signed)
Emergency Medicine Observation Re-evaluation Note  VELMA AGNES is a 75 y.o. female, seen on rounds today.  Pt initially presented to the ED for complaints of Fall Currently, the patient is resting comfortably without complaints.  Physical Exam  BP 106/63   Pulse 73   Temp 99.1 F (37.3 C) (Axillary) Comment (Src): 98.6 oral but had sip icewater  Resp 18   SpO2 95%  Physical Exam Gen: No acute distress  Resp: Normal rise and fall of chest Neuro: Moving all four extremities Psych: Resting currently, calm and cooperative when awake    ED Course / MDM  EKG:EKG Interpretation  Date/Time:  Thursday November 07 2020 12:25:41 EST Ventricular Rate:  91 PR Interval:  154 QRS Duration: 92 QT Interval:  370 QTC Calculation: 455 R Axis:   -12 Text Interpretation: Normal sinus rhythm Possible Anterior infarct , age undetermined Abnormal ECG Confirmed by UNCONFIRMED, DOCTOR ([id number]), editor Mel Almond, Tammy [phone]) on 11/07/2020 2:13:14 PM    I have reviewed the labs performed to date as well as medications administered while in observation.  Recent changes in the last 24 hours include no acute events overnight.  Plan  Current plan is for SNF placement. Patient is not under full IVC at this time.   Aida Lemaire, Delice Bison, DO 11/11/20 [phone]

## 2020-11-12 ENCOUNTER — Inpatient Hospital Stay: Payer: Medicare Other | Admitting: Unknown Physician Specialty

## 2020-11-12 NOTE — ED Notes (Signed)
Pt assisted x2 from bed to recliner. Full linen change on bed. purwick removed at this time.

## 2020-11-12 NOTE — ED Notes (Signed)
Pt sleeping. 

## 2020-11-12 NOTE — ED Notes (Signed)
Pt eating lunch tray at this time  

## 2020-11-12 NOTE — ED Notes (Signed)
Pt clean and dry, no complaints at this time.

## 2020-11-12 NOTE — ED Notes (Signed)
Pt resting comfortably with eyes closed, will continue to monitor.  

## 2020-11-12 NOTE — ED Provider Notes (Signed)
-----------------------------------------   7:02 AM on 11/12/2020 -----------------------------------------   Blood pressure (!) 98/57, pulse 96, temperature 97.9 F (36.6 C), temperature source Oral, resp. rate 18, SpO2 100 %.  The patient is calm and cooperative at this time.  There have been no acute events since the last update.  Awaiting disposition plan from Social Work team.   Paulette Blanch, MD 11/12/20 [phone]

## 2020-11-12 NOTE — ED Notes (Signed)
Report received from [facility] RN. Patient care assumed. Patient/RN introduction complete. Will continue to monitor.

## 2020-11-12 NOTE — ED Notes (Signed)
New purewick placed on pt. Pt back in bed at this time.

## 2020-11-12 NOTE — TOC Progression Note (Signed)
Transition of Care [facility]) - Progression Note    Patient Details  Name: Chelsea [name] MRN: [id number] Date of Birth: Jan 14, 1946  Transition of Care [facility]) CM/SW [place], [place] Phone Number:  [phone] 11/12/2020, 11:10 AM  Clinical Narrative:     CSW called Debbie from [facility] and left voicemail requesting update about insurance situation.  CSW spoke with [place] at [facility], and she stated they would not be able to take the patient because they are not taking patient's from the ED.     Barriers to Discharge: SNF Pending bed offer  Expected Discharge Plan and Services                                                 Social Determinants of Health (SDOH) Interventions    Readmission Risk Interventions No flowsheet data found.

## 2020-11-12 NOTE — ED Notes (Signed)
This RN spoke to daughter this morning.

## 2020-11-13 DIAGNOSIS — S40012A Contusion of left shoulder, initial encounter: Secondary | ICD-10-CM | POA: Diagnosis not present

## 2020-11-13 DIAGNOSIS — M129 Arthropathy, unspecified: Secondary | ICD-10-CM | POA: Diagnosis not present

## 2020-11-13 LAB — POC SARS CORONAVIRUS 2 AG -  ED: SARS Coronavirus 2 Ag: NEGATIVE

## 2020-11-13 NOTE — Discharge Instructions (Signed)
Return to the ER for new, worsening, or persistent severe pain, worsening weakness, recurrent falls, or any other new or worsening symptoms.

## 2020-11-13 NOTE — ED Notes (Signed)
[facility] and gave report to Amber, RN at [phone]. Pt going to room 101.

## 2020-11-13 NOTE — ED Notes (Signed)
Pt back in bed. Unable to have BM. Purewick changed,

## 2020-11-13 NOTE — ED Notes (Signed)
[facility]  CALLED  FOR  TRANSPORT  TO  [place]

## 2020-11-13 NOTE — TOC Progression Note (Signed)
Transition of Care [facility]) - Progression Note    Patient Details  Name: Chelsea [name] MRN: [id number] Date of Birth: 1946-09-12  Transition of Care [facility]) CM/SW [place], [place] Phone Number: [phone] 11/13/2020, 12:08 PM  Clinical Narrative:     Patient has bed offer at [facility], pending negative COVID test results.  Patient's [name] (Daughter) [phone], has been updated.    Barriers to Discharge: SNF Pending bed offer  Expected Discharge Plan and Services                                                 Social Determinants of Health (SDOH) Interventions    Readmission Risk Interventions No flowsheet data found.

## 2020-11-13 NOTE — ED Notes (Signed)
Helped pt walk to room toilet with walker. Pt sitting on toilet to have BM.

## 2020-11-13 NOTE — ED Notes (Signed)
Pt breakfast tray was cold. Dietary is sending new breakfast tray.

## 2020-11-13 NOTE — Progress Notes (Signed)
PT Cancellation Note  Patient Details Name: Chelsea [name] MRN: [id number] DOB: 1946-03-28   Cancelled Treatment:    Reason Eval/Treat Not Completed: Other (comment). Repeat PT orders completed at this time. See most recent PT treatment note from 2/7 for recommendations.  Lieutenant Diego PT, DPT 10:12 AM,11/13/20

## 2020-11-13 NOTE — ED Notes (Signed)
Provided warm breakfast tray. Repositioned in bed.

## 2020-11-13 NOTE — ED Notes (Signed)
POC covid test performed.

## 2020-11-13 NOTE — TOC Transition Note (Signed)
Transition of Care [facility]) - CM/SW Discharge Note   Patient Details  Name: Chelsea [name] MRN: [id number] Date of Birth: 06-Jun-1946  Transition of Care [facility]) CM/SW Contact:  Adelene Amas, [place] Phone Number: 11/13/2020, 1:01 PM   Clinical Narrative:     Patient will d/c to [facility], Room 101, Report [phone]. Please make AVS available as soon as possible.EDP/ED Staff updated.  CSW updated patient's [name] (Daughter) [phone].  TOC consult completed.      Barriers to Discharge: SNF Pending bed offer   Patient Goals and CMS Choice Patient states their goals for this hospitalization and ongoing recovery are:: Goign to a SNF and then returning to [facility]                                     Social Determinants of Health (SDOH) Interventions     Readmission Risk Interventions No flowsheet data found.

## 2020-11-13 NOTE — ED Provider Notes (Signed)
-----------------------------------------   1:00 PM on 11/13/2020 -----------------------------------------  [facility] team has arranged for discharge to [facility].  The patient is stable for discharge at this time.  Return precautions have been provided.   Arta Silence, MD 11/13/20 1300

## 2020-12-05 ENCOUNTER — Telehealth: Payer: Self-pay | Admitting: Family Medicine

## 2020-12-05 NOTE — Telephone Encounter (Signed)
Dorthy Cooler RN, calling from [facility] is calling for Verbal Orders for Nursing 1 week for 3 weeks, then every other week for 6 weeks.  PT & OT are needed too. They will call after they do their assesment.  [phone] Ok to leave verbal on VM

## 2020-12-06 NOTE — Telephone Encounter (Signed)
Verbal given 

## 2020-12-06 NOTE — Telephone Encounter (Signed)
[place] for verbal orders.  Nobie Putnam, DO [facility] 12/06/2020, 8:12 AM

## 2020-12-11 ENCOUNTER — Telehealth: Payer: Self-pay | Admitting: Adult Health Nurse Practitioner

## 2020-12-11 NOTE — Telephone Encounter (Signed)
Called patient to offer to schedule a Palliative Consult, no answer - left message with reason for call along with my name and call back number. 

## 2020-12-13 ENCOUNTER — Telehealth: Payer: Self-pay | Admitting: Family Medicine

## 2020-12-13 NOTE — Telephone Encounter (Signed)
Chelsea [name] OT with [facility] is calling and the pt needs OT 1x7

## 2020-12-13 NOTE — Telephone Encounter (Signed)
Ok to proceed  Chelsea [name], [facility] 12/13/2020, 3:50 PM

## 2020-12-13 NOTE — Telephone Encounter (Signed)
Verbal given with Marlowe Kays with OT.

## 2020-12-17 ENCOUNTER — Encounter: Payer: Self-pay | Admitting: Unknown Physician Specialty

## 2020-12-17 ENCOUNTER — Ambulatory Visit (INDEPENDENT_AMBULATORY_CARE_PROVIDER_SITE_OTHER): Payer: Medicare Other | Admitting: Unknown Physician Specialty

## 2020-12-17 ENCOUNTER — Other Ambulatory Visit: Payer: Self-pay

## 2020-12-17 DIAGNOSIS — R296 Repeated falls: Secondary | ICD-10-CM | POA: Diagnosis not present

## 2020-12-17 DIAGNOSIS — F3341 Major depressive disorder, recurrent, in partial remission: Secondary | ICD-10-CM

## 2020-12-17 MED ORDER — DICLOFENAC SODIUM 1 % EX GEL: 4.0000 g | Freq: Four times a day (QID) | CUTANEOUS | 0 refills | Status: DC

## 2020-12-17 MED ORDER — ALPRAZOLAM 0.25 MG PO TABS: 0.2500 mg | ORAL_TABLET | Freq: Two times a day (BID) | ORAL | 0 refills | Status: DC | PRN

## 2020-12-17 NOTE — Assessment & Plan Note (Signed)
Stop Prozac as has not been taking it.  If she needs something consider Sertraline

## 2020-12-17 NOTE — Progress Notes (Signed)
BP (!) 87/60 (BP Location: Right Arm, Patient Position: Sitting, Cuff Size: Normal)   Pulse 85   Temp (!) 97.5 F (36.4 C) (Temporal)   Resp 17   Ht 4' 9"  (1.448 m)   Wt 177 lb (80.3 kg)   SpO2 99%   BMI 38.30 kg/m    Subjective:    Patient ID: Chelsea [name], female    DOB: 26-Jul-1946, 75 y.o.   MRN: [id number]  HPI: Chelsea [name] is a 75 y.o. female  Chief Complaint  Patient presents with  . 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  Several falls injured Lt, knee, Lt ankle and Right shoulder. Pt was recently discharged from rehab x 1 week     Pt is here for f/u falls.  She was hospitalized and discharged from rehab and given several medications which include Gabapentin, Maxide, Promethazine, Diclofenac and Atorvastatin.  She states she needs her Xanax that she has been taking quite some time.  Prozac in on the list but it doesn't seem that she was taking in while in rehab  Discussed her falls and contributing factors.  Became agitated when discussing that Xanax can contribute to falls. Also agitated with the medications from rehab and the cost of the medications.  Uses a walker and continuing with knee and shoulder pain  Currently receiving OT and PT at home.    Relevant past medical, surgical, family and social history reviewed and updated as indicated. Interim medical history since our last visit reviewed. Allergies and medications reviewed and updated.  Review of Systems  Per HPI unless specifically indicated above     Objective:    BP (!) 87/60 (BP Location: Right Arm, Patient Position: Sitting, Cuff Size: Normal)   Pulse 85   Temp (!) 97.5 F (36.4 C) (Temporal)   Resp 17   Ht 4' 9"  (1.448 m)   Wt 177 lb (80.3 kg)   SpO2 99%   BMI 38.30 kg/m   Wt Readings from Last 3 Encounters:  12/17/20 177 lb (80.3 kg)  10/28/20 178 lb (80.7 kg)  10/22/20 178 lb (80.7 kg)    Physical  Exam Constitutional:      General: She is not in acute distress.    Appearance: Normal appearance. She is well-developed.  HENT:     Head: Normocephalic and atraumatic.  Eyes:     General: Lids are normal. No scleral icterus.       Right eye: No discharge.        Left eye: No discharge.     Conjunctiva/sclera: Conjunctivae normal.  Neck:     Vascular: No carotid bruit or JVD.  Cardiovascular:     Rate and Rhythm: Normal rate and regular rhythm.     Heart sounds: Normal heart sounds.  Pulmonary:     Effort: Pulmonary effort is normal.     Breath sounds: Normal breath sounds.  Abdominal:     Palpations: There is no hepatomegaly or splenomegaly.  Musculoskeletal:     Cervical back: Normal range of motion and neck supple.     Comments: Uses a walker.    Skin:    General: Skin is warm and dry.     Coloration: Skin is not pale.     Findings: No rash.  Neurological:     Mental Status: She is alert and oriented to person, place, and time.  Psychiatric:        Behavior: Behavior normal.  Thought Content: Thought content normal.        Judgment: Judgment normal.     Results for orders placed or performed during the hospital encounter of 11/07/20  CBC with Differential  Result Value Ref Range   WBC 14.4 (H) 4.0 - 10.5 K/uL   RBC 3.53 (L) 3.87 - 5.11 MIL/uL   Hemoglobin 10.8 (L) 12.0 - 15.0 g/dL   HCT 33.2 (L) 36.0 - 46.0 %   MCV 94.1 80.0 - 100.0 fL   MCH 30.6 26.0 - 34.0 pg   MCHC 32.5 30.0 - 36.0 g/dL   RDW 14.6 11.5 - 15.5 %   Platelets 206 150 - 400 K/uL   nRBC 0.0 0.0 - 0.2 %   Neutrophils Relative % 75 %   Neutro Abs 10.9 (H) 1.7 - 7.7 K/uL   Lymphocytes Relative 13 %   Lymphs Abs 1.8 0.7 - 4.0 K/uL   Monocytes Relative 11 %   Monocytes Absolute 1.6 (H) 0.1 - 1.0 K/uL   Eosinophils Relative 0 %   Eosinophils Absolute 0.0 0.0 - 0.5 K/uL   Basophils Relative 0 %   Basophils Absolute 0.0 0.0 - 0.1 K/uL   Immature Granulocytes 1 %   Abs Immature Granulocytes  0.08 (H) 0.00 - 0.07 K/uL  Basic metabolic panel  Result Value Ref Range   Sodium 140 135 - 145 mmol/L   Potassium 3.3 (L) 3.5 - 5.1 mmol/L   Chloride 100 98 - 111 mmol/L   CO2 29 22 - 32 mmol/L   Glucose, Bld 106 (H) 70 - 99 mg/dL   BUN 19 8 - 23 mg/dL   Creatinine, Ser 0.85 0.44 - 1.00 mg/dL   Calcium 9.3 8.9 - 10.3 mg/dL   GFR, Estimated >60 >60 mL/min   Anion gap 11 5 - 15  Urinalysis, Complete w Microscopic  Result Value Ref Range   Color, Urine YELLOW (A) YELLOW   APPearance HAZY (A) CLEAR   Specific Gravity, Urine 1.012 1.005 - 1.030   pH 7.0 5.0 - 8.0   Glucose, UA NEGATIVE NEGATIVE mg/dL   Hgb urine dipstick SMALL (A) NEGATIVE   Bilirubin Urine NEGATIVE NEGATIVE   Ketones, ur NEGATIVE NEGATIVE mg/dL   Protein, ur NEGATIVE NEGATIVE mg/dL   Nitrite NEGATIVE NEGATIVE   Leukocytes,Ua SMALL (A) NEGATIVE   WBC, UA 11-20 0 - 5 WBC/hpf   Bacteria, UA NONE SEEN NONE SEEN   Squamous Epithelial / LPF 0-5 0 - 5   Budding Yeast PRESENT   POC SARS Coronavirus 2 Ag-ED - Nasal Swab (BD Veritor Kit)  Result Value Ref Range   SARS Coronavirus 2 Ag NEGATIVE NEGATIVE      Assessment & Plan:   Problem List Items Addressed This Visit      Unprioritized   Depression, major, recurrent, in partial remission ([place])    Stop Prozac as has not been taking it.  If she needs something consider Sertraline      Relevant Medications   ALPRAZolam (XANAX) 0.25 MG tablet   Falls frequently    BP low today.  Stop Maxide.  Decrease Xanax from 1/2 mg TID to .25 mg BID.  Discussed this can contribute to falls.  Check labs next visit.            Follow up plan: Return in about 4 weeks (around 01/14/2021).

## 2020-12-17 NOTE — Assessment & Plan Note (Signed)
BP low today.  Stop Maxide.  Decrease Xanax from 1/2 mg TID to .25 mg BID.  Discussed this can contribute to falls.  Check labs next visit.

## 2020-12-24 ENCOUNTER — Telehealth: Payer: Self-pay | Admitting: Adult Health Nurse Practitioner

## 2020-12-25 ENCOUNTER — Other Ambulatory Visit: Payer: Self-pay | Admitting: Unknown Physician Specialty

## 2020-12-25 DIAGNOSIS — Z1231 Encounter for screening mammogram for malignant neoplasm of breast: Secondary | ICD-10-CM

## 2020-12-26 ENCOUNTER — Telehealth: Payer: Self-pay | Admitting: Adult Health Nurse Practitioner

## 2020-12-26 NOTE — Telephone Encounter (Signed)
Called patient's daughter, [name], to schedule an In-home Palliative Consult, no answer - left message requesting a return call, left my name and call back number.

## 2021-01-03 ENCOUNTER — Ambulatory Visit (INDEPENDENT_AMBULATORY_CARE_PROVIDER_SITE_OTHER): Payer: Medicare Other | Admitting: Family Medicine

## 2021-01-03 ENCOUNTER — Ambulatory Visit: Payer: Self-pay | Admitting: *Deleted

## 2021-01-03 ENCOUNTER — Other Ambulatory Visit: Payer: Self-pay

## 2021-01-03 ENCOUNTER — Encounter: Payer: Self-pay | Admitting: Family Medicine

## 2021-01-03 ENCOUNTER — Telehealth: Payer: Self-pay | Admitting: Adult Health Nurse Practitioner

## 2021-01-03 ENCOUNTER — Telehealth: Payer: Self-pay

## 2021-01-03 ENCOUNTER — Telehealth: Payer: Self-pay | Admitting: Family Medicine

## 2021-01-03 VITALS — BP 148/66 | HR 77 | Ht 61.0 in | Wt 172.4 lb

## 2021-01-03 DIAGNOSIS — F411 Generalized anxiety disorder: Secondary | ICD-10-CM | POA: Diagnosis not present

## 2021-01-03 DIAGNOSIS — R600 Localized edema: Secondary | ICD-10-CM | POA: Diagnosis not present

## 2021-01-03 DIAGNOSIS — I10 Essential (primary) hypertension: Secondary | ICD-10-CM | POA: Diagnosis not present

## 2021-01-03 DIAGNOSIS — M4807 Spinal stenosis, lumbosacral region: Secondary | ICD-10-CM

## 2021-01-03 MED ORDER — ALPRAZOLAM 0.5 MG PO TABS: 0.5000 mg | ORAL_TABLET | Freq: Two times a day (BID) | ORAL | 2 refills | Status: DC | PRN

## 2021-01-03 MED ORDER — GABAPENTIN 100 MG PO CAPS: 200.0000 mg | ORAL_CAPSULE | Freq: Two times a day (BID) | ORAL | 1 refills | Status: DC

## 2021-01-03 MED ORDER — TRIAMTERENE-HCTZ 37.5-25 MG PO TABS: 1.0000 | ORAL_TABLET | Freq: Every day | ORAL | 1 refills | Status: DC

## 2021-01-03 NOTE — Telephone Encounter (Addendum)
Pt returned the office call. Advised pt per provider below. Pt expressed understanding then stated that Gabapentin does help her with pain. Pt would like to continue taking the medication.

## 2021-01-03 NOTE — Patient Instructions (Addendum)
Thank you for coming to the office today.  Increase Xanax back to 0.5mg  twice a day., new rx sent.  Maxzide 37.5mg -25mg  once daily - restart your BP medication to help control BP and reduce swelling.  Use RICE therapy: - R - Rest / relative rest with activity modification avoid overuse of joint - I - Ice packs (make sure you use a towel or sock / something to protect skin) - C - Compression socks or stockings or ACE wrap to apply pressure and reduce swelling allowing more support - E - Elevation - if significant swelling, lift leg above heart level (toes above your nose) to help reduce swelling, most helpful at night after day of being on your feet   Please schedule a Follow-up Appointment to: Return in about 6 weeks (around 02/14/2021) for 4-6 weeks from today, please re-schedule 4/19 Malachy Mood apt to [place] instead..  If you have any other questions or concerns, please feel free to call the office or send a message through [place]. You may also schedule an earlier appointment if necessary.  Additionally, you may be receiving a survey about your experience at our office within a few days to 1 week by e-mail or mail. We value your feedback.  Nobie Putnam, DO [place]

## 2021-01-03 NOTE — Telephone Encounter (Signed)
Patient is calling to report she has swelling in legs- this is new and elevation is not helping. Patient is very frustarted because she states she did not have this while she was in rehab and it occurred after her visit to office and medication changes. Patient wants to be seen today- patient scheduled for evaluation of legs.  Reason for Disposition . [1] MODERATE leg swelling (e.g., swelling extends up to knees) AND [2] new-onset or worsening  Answer Assessment - Initial Assessment Questions 1. ONSET: "When did the swelling start?" (e.g., minutes, hours, days)     Last 2-3 day 2. LOCATION: "What part of the leg is swollen?"  "Are both legs swollen or just one leg?"     Both feet and legs- left leg is swollen more than right- swelling almost all the way to the knee on the left 3. SEVERITY: "How bad is the swelling?" (e.g., localized; mild, moderate, severe)  - Localized - small area of swelling localized to one leg  - MILD pedal edema - swelling limited to foot and ankle, pitting edema < 1/4 inch (6 mm) deep, rest and elevation eliminate most or all swelling  - MODERATE edema - swelling of lower leg to knee, pitting edema > 1/4 inch (6 mm) deep, rest and elevation only partially reduce swelling  - SEVERE edema - swelling extends above knee, facial or hand swelling present      moderate 4. REDNESS: "Does the swelling look red or infected?"     no 5. PAIN: "Is the swelling painful to touch?" If Yes, ask: "How painful is it?"   (Scale 1-10; mild, moderate or severe)     Stings and burns, painful to touch 6. FEVER: "Do you have a fever?" If Yes, ask: "What is it, how was it measured, and when did it start?"      No fever 7. CAUSE: "What do you think is causing the leg swelling?"     Recent changes in medication 8. MEDICAL HISTORY: "Do you have a history of heart failure, kidney disease, liver failure, or cancer?"    Recent fall- in rehab for 1 month- no swelling in rehab 9. RECURRENT SYMPTOM:  "Have you had leg swelling before?" If Yes, ask: "When was the last time?" "What happened that time?"     2-3 years ago- elevation 10. OTHER SYMPTOMS: "Do you have any other symptoms?" (e.g., chest pain, difficulty breathing)      Frequent urination, constipation 11. PREGNANCY: "Is there any chance you are pregnant?" "When was your last menstrual period?"       n/a  Protocols used: LEG SWELLING AND EDEMA-A-AH

## 2021-01-03 NOTE — Progress Notes (Signed)
Subjective:    Patient ID: JOSEFITA WEISSMANN, female    DOB: Sep 17, 1946, 75 y.o.   MRN: [id number]  EVAGELIA KNACK is a 75 y.o. female presenting on 01/03/2021 for Leg Swelling  Previous PCP Cyndia Skeeters, FNP   HPI  Hypertension / Elevated BP Lower Extremity Edema R>L  History recently of falls, and rehab, has seen Kathrine Haddock NP here in [place] on 12/17/20. At that appointment her BP was significantly low at 87/60. Her BP medication was held at that time Maxzide. Among other new medications from hospital including Gabapentin, Promethazine, higher risk medication w/ potential side effects. Also she was tapered down on Fluoxetine, and lowered Xanax.  Today she reports overall she is doing fairly well but now has increased lower extremity edema, she normally does not have, swelling, worse Left > Right. No redness or pain but has some discomfort with swelling. Tried Elevation, but not using compression. Her BP was up to >140 recently as well. She asks about restarting her BP medication.  Her Anxiety is not as controlled now on the lower Xanax 0.31m BID, she was previously on 0.567mTID in past.  She has R lower leg disability spinal stenosis. She does not use sodium   [facility] 08/13/2020 11/23/2019 08/30/2019  Decreased Interest 0 2 2  Down, Depressed, Hopeless 0 1 1  PHQ - 2 Score 0 3 3  Altered sleeping 0 1 1  Tired, decreased energy 0 0 2  Change in appetite 0 0 0  Feeling bad or failure about yourself  0 0 0  Trouble concentrating 0 2 3  Moving slowly or fidgety/restless 0 2 2  Suicidal thoughts 0 0 0  PHQ-9 Score 0 8 11  Difficult doing work/chores Not difficult at all Not difficult at all Somewhat difficult    Social History   Tobacco Use  . Smoking status: Never Smoker  . Smokeless tobacco: Never Used  Vaping Use  . Vaping Use: Never used  Substance Use Topics  . Alcohol use: Never  . Drug use: No    Review of Systems Per HPI unless specifically  indicated above     Objective:    BP (!) 148/66   Pulse 77   Ht _0  (1.549 m)   Wt 172 lb 6.4 oz (78.2 kg)   SpO2 99%   BMI 32.57 kg/m   Wt Readings from Last 3 Encounters:  01/03/21 172 lb 6.4 oz (78.2 kg)  12/17/20 177 lb (80.3 kg)  10/28/20 178 lb (80.7 kg)    Physical Exam Vitals and nursing note reviewed.  Constitutional:      General: She is not in acute distress.    Appearance: She is well-developed. She is not diaphoretic.     Comments: Well-appearing, comfortable, cooperative  HENT:     Head: Normocephalic and atraumatic.  Eyes:     General:        Right eye: No discharge.        Left eye: No discharge.     Conjunctiva/sclera: Conjunctivae normal.  Cardiovascular:     Rate and Rhythm: Normal rate.  Pulmonary:     Effort: Pulmonary effort is normal.  Musculoskeletal:     Right lower leg: Edema (+1 non pitting) present.     Left lower leg: Edema (left lower leg and ankle non pitting +2 edema, no erythema, non focal) present.  Skin:    General: Skin is warm and dry.  Findings: No erythema or rash.  Neurological:     Mental Status: She is alert and oriented to person, place, and time.  Psychiatric:        Behavior: Behavior normal.     Comments: Well groomed, good eye contact, normal speech and thoughts        Results for orders placed or performed during the hospital encounter of 11/07/20  CBC with Differential  Result Value Ref Range   WBC 14.4 (H) 4.0 - 10.5 K/uL   RBC 3.53 (L) 3.87 - 5.11 MIL/uL   Hemoglobin 10.8 (L) 12.0 - 15.0 g/dL   HCT 33.2 (L) 36.0 - 46.0 %   MCV 94.1 80.0 - 100.0 fL   MCH 30.6 26.0 - 34.0 pg   MCHC 32.5 30.0 - 36.0 g/dL   RDW 14.6 11.5 - 15.5 %   Platelets 206 150 - 400 K/uL   nRBC 0.0 0.0 - 0.2 %   Neutrophils Relative % 75 %   Neutro Abs 10.9 (H) 1.7 - 7.7 K/uL   Lymphocytes Relative 13 %   Lymphs Abs 1.8 0.7 - 4.0 K/uL   Monocytes Relative 11 %   Monocytes Absolute 1.6 (H) 0.1 - 1.0 K/uL   Eosinophils  Relative 0 %   Eosinophils Absolute 0.0 0.0 - 0.5 K/uL   Basophils Relative 0 %   Basophils Absolute 0.0 0.0 - 0.1 K/uL   Immature Granulocytes 1 %   Abs Immature Granulocytes 0.08 (H) 0.00 - 0.07 K/uL  Basic metabolic panel  Result Value Ref Range   Sodium 140 135 - 145 mmol/L   Potassium 3.3 (L) 3.5 - 5.1 mmol/L   Chloride 100 98 - 111 mmol/L   CO2 29 22 - 32 mmol/L   Glucose, Bld 106 (H) 70 - 99 mg/dL   BUN 19 8 - 23 mg/dL   Creatinine, Ser 0.85 0.44 - 1.00 mg/dL   Calcium 9.3 8.9 - 10.3 mg/dL   GFR, Estimated >60 >60 mL/min   Anion gap 11 5 - 15  Urinalysis, Complete w Microscopic  Result Value Ref Range   Color, Urine YELLOW (A) YELLOW   APPearance HAZY (A) CLEAR   Specific Gravity, Urine 1.012 1.005 - 1.030   pH 7.0 5.0 - 8.0   Glucose, UA NEGATIVE NEGATIVE mg/dL   Hgb urine dipstick SMALL (A) NEGATIVE   Bilirubin Urine NEGATIVE NEGATIVE   Ketones, ur NEGATIVE NEGATIVE mg/dL   Protein, ur NEGATIVE NEGATIVE mg/dL   Nitrite NEGATIVE NEGATIVE   Leukocytes,Ua SMALL (A) NEGATIVE   WBC, UA 11-20 0 - 5 WBC/hpf   Bacteria, UA NONE SEEN NONE SEEN   Squamous Epithelial / LPF 0-5 0 - 5   Budding Yeast PRESENT   POC SARS Coronavirus 2 Ag-ED - Nasal Swab (BD Veritor Kit)  Result Value Ref Range   SARS Coronavirus 2 Ag NEGATIVE NEGATIVE      Assessment & Plan:   Problem List Items Addressed This Visit    Essential hypertension - Primary   Relevant Medications   triamterene-hydrochlorothiazide (MAXZIDE-25) 37.5-25 MG tablet   Anxiety state   Relevant Medications   ALPRAZolam (XANAX) 0.5 MG tablet    Other Visit Diagnoses    Bilateral lower extremity edema       Relevant Medications   triamterene-hydrochlorothiazide (MAXZIDE-25) 37.5-25 MG tablet      Hypertension LE Edema No sign of focal problem. Low Risk for DVT. Exam not supportive.  Now elevated BP, home readings up too Secondary swelling can  be related to coming off diuretic Will restart her Maxzide 37.5-25mg  daily Recommend RICE therapy, compression elevation Monitor BP  Anxiety Adjust dose back up to slightly higher, 0.66m BID instead of TID now. She was on the 0.25 BID and says it was ineffective for her anxiety. She understands risks and potential side effects Checked PDMP   Meds ordered this encounter  Medications  . triamterene-hydrochlorothiazide (MAXZIDE-25) 37.5-25 MG tablet    Sig: Take 1 tablet by mouth daily.    Dispense:  90 tablet    Refill:  1  . ALPRAZolam (XANAX) 0.5 MG tablet    Sig: Take 1 tablet (0.5 mg total) by mouth 2 (two) times daily as needed for anxiety.    Dispense:  60 tablet    Refill:  2    Dose adjustment back to 0.5      Follow up plan: Return in about 6 weeks (around 02/14/2021) for 4-6 weeks from today, please re-schedule 4/19 [facility] [place] [place]..Nobie Putnam DO [facility] 01/03/2021, 11:10 AM

## 2021-01-03 NOTE — Telephone Encounter (Signed)
Spoke with daughter, [name], regarding the Palliative referral/services and all questions were answered and she was in agreement with scheduling a visit.  I have scheduled an In-home Consult for 01/28/21 @ 11 AM, at daughter's request.  Patient is also receiving services with [facility] and she said that the patient is overwhelmed with so many different people coming into the home so that is why she wanted to schedule the visit out.

## 2021-01-03 NOTE — Telephone Encounter (Signed)
I did a med rec with patient, and from our discussion during visit today - my understanding is that the Gabapentin was a new medication from the hospital and Kathrine Haddock NP had discontinued the Gabapentin at her last visit 2 weeks ago.  Gabapentin can cause swelling as well.  I discontinued it again off her list today because I believe we were on the understanding that she was going to stop taking it or had already stopped it.  Now, if she is still taking it every day and it is helping her - usually it would be for pain.  Then I am okay to re order it.  Let me know.  Nobie Putnam, [facility] 01/03/2021, 2:30 PM

## 2021-01-03 NOTE — Addendum Note (Signed)
Addended by: Olin Hauser on: 01/03/2021 05:06 PM   Modules accepted: Orders

## 2021-01-03 NOTE — Telephone Encounter (Signed)
Patient wants to know if she is going to continue on Gabapentin because she doesn't show on her after visit summary. Patient states that she is doing 100mg  2 tabs 2 times a day.  Patient needs this to be sent to pharmacy. Please advise

## 2021-01-03 NOTE — Telephone Encounter (Signed)
Patient was transferred to me after she said she had spoken to four different people. She currently has an appointment with Dr. Raliegh Ip at 11 but she was calling to get (I assume advise, but she hung up on me before I could get the information I needed) I tried to see what she was asking for [place] to do because she said that her leg swelling is so bad that she can't drive to the office. I asked if she has anyone that could bring her but she got upset and hung up the phone. I tried calling her back but she answered and immediately hung up. I will try again or wait to see if she calls because she is currently still on the schedule.

## 2021-01-06 ENCOUNTER — Other Ambulatory Visit: Payer: Self-pay | Admitting: Family Medicine

## 2021-01-06 DIAGNOSIS — I10 Essential (primary) hypertension: Secondary | ICD-10-CM

## 2021-01-06 DIAGNOSIS — R600 Localized edema: Secondary | ICD-10-CM

## 2021-01-06 NOTE — Telephone Encounter (Signed)
Patient states pharmacy advised it was to early to refill triamterene-hydrochlorothiazide (MAXZIDE-25) 37.5-25 MG tablet, patient states she has been without because the medication was d/c. Patient states PCP has now placed her back on medication and she has discarded the old medication when it was d/c seeking a new refill.   [id number] [address], [place] Phone:  [phone]  Fax:  [phone]

## 2021-01-06 NOTE — Telephone Encounter (Signed)
patient states she has been without because the medication was d/c. Patient states PCP has now placed her back on medication and she has discarded the old medication when it was d/c seeking a new refill

## 2021-01-09 IMAGING — CR DG ELBOW COMPLETE 3+V*L*
1 series · 4 of 4 positions shown · non-contrast
Comparison: None.

CLINICAL DATA: Fall

EXAM:
LEFT ELBOW - COMPLETE 3+ VIEW

[Series 1: dg elbow complete left (3+view) · 0.14mm/px · 4 of 4 slices shown]
[im 1/4]
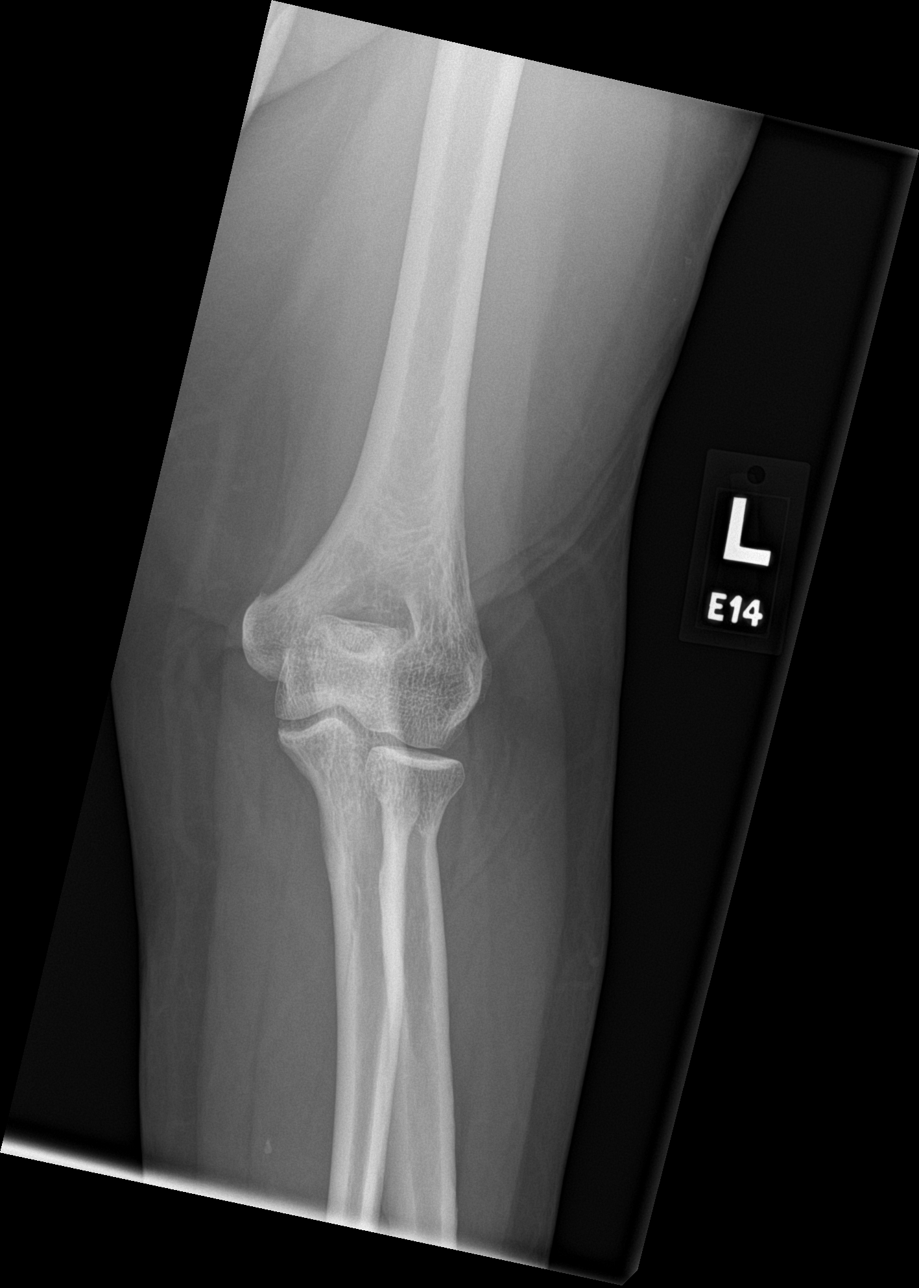
[im 2/4]
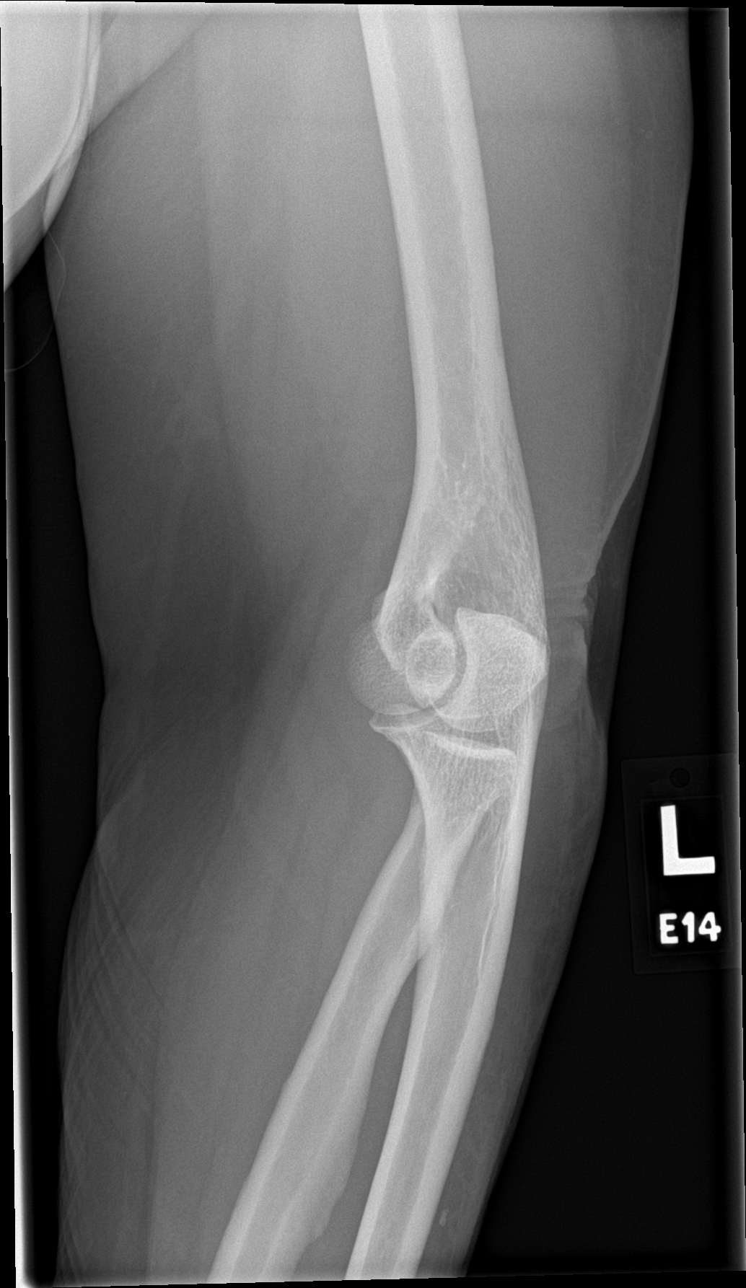
[im 3/4]
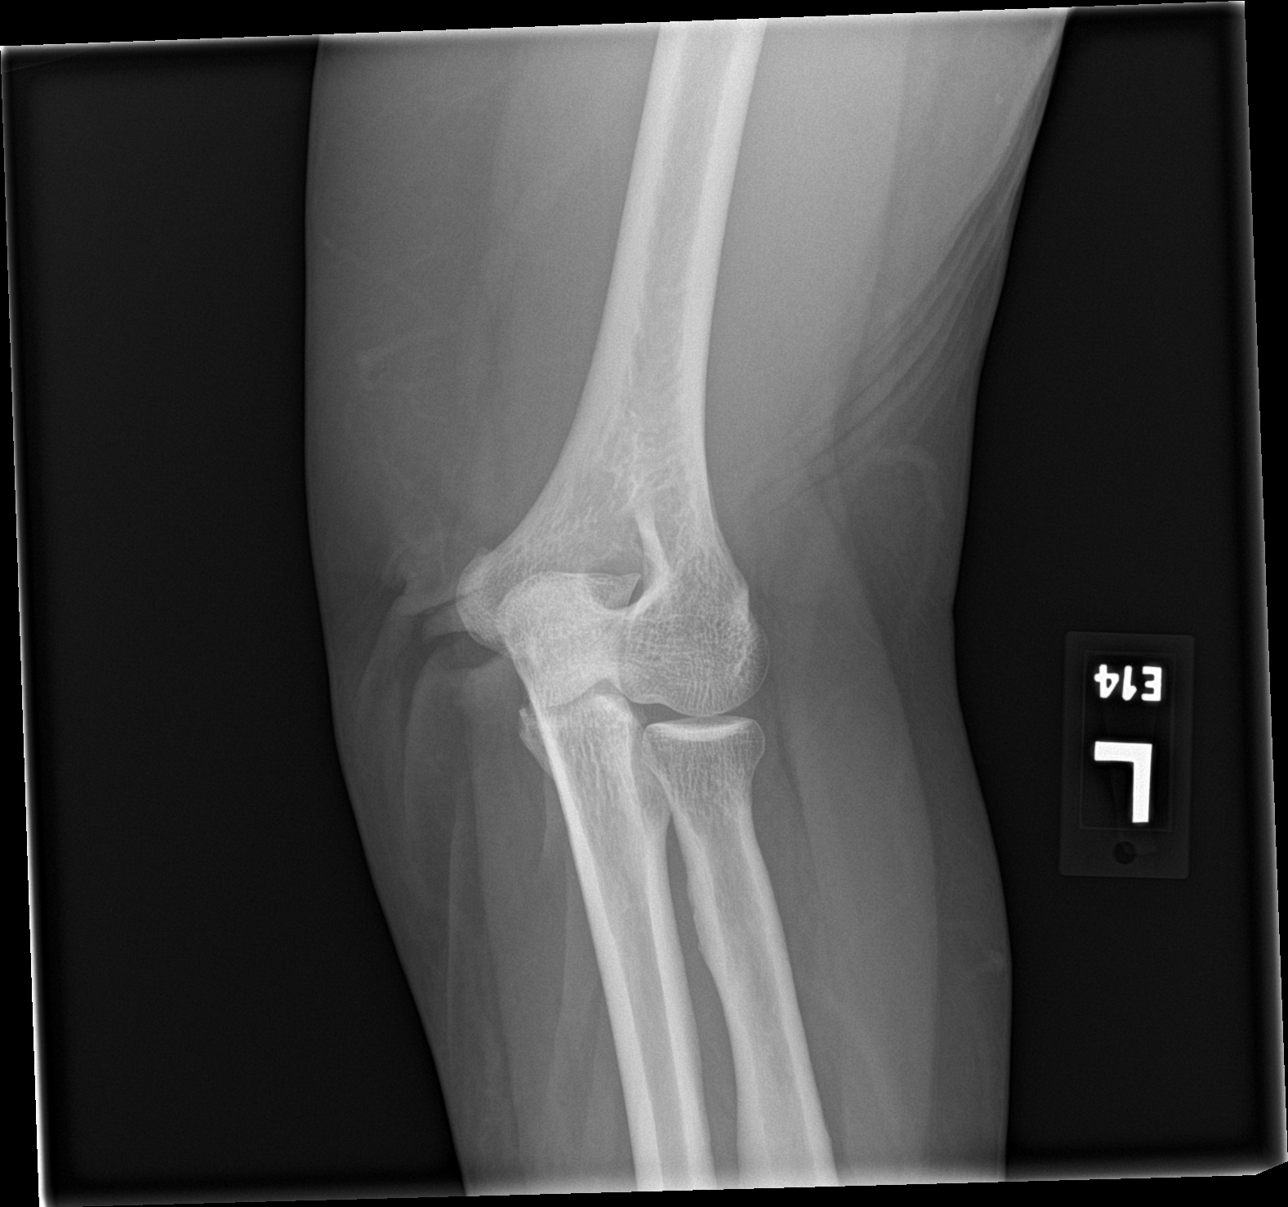
[im 4/4]
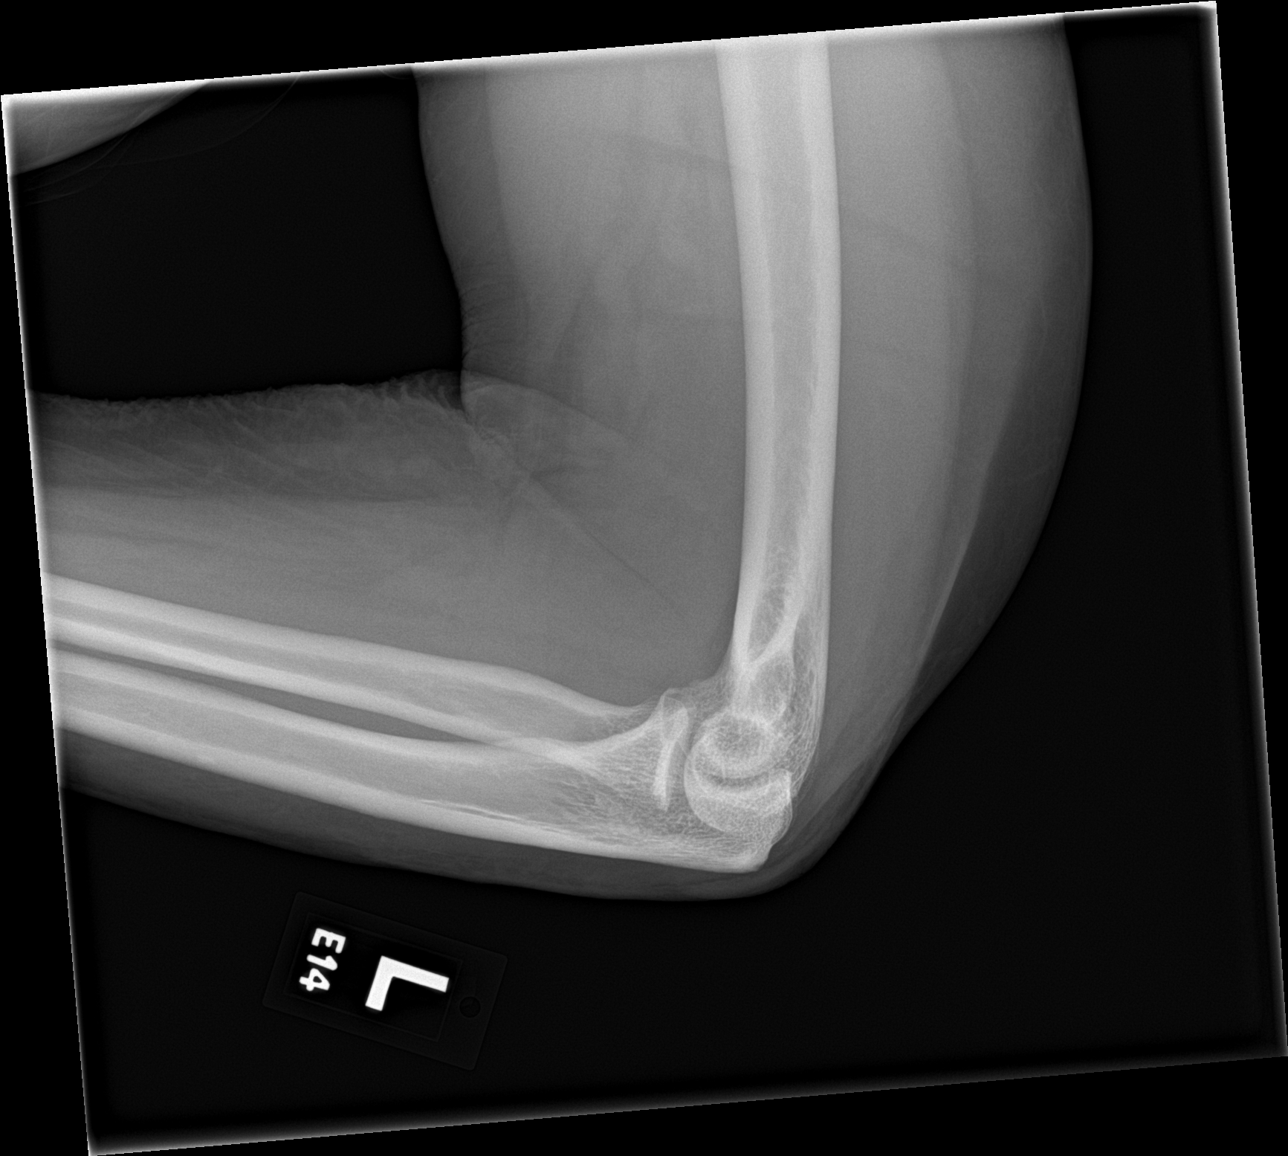

[4 of 4 positions shown; findings below may reference images not displayed]

FINDINGS: No acute fracture or dislocation. Joint spaces and alignment are
maintained. No area of erosion or osseous destruction. No unexpected
radiopaque foreign body. Soft tissues are unremarkable.
IMPRESSION: No acute fracture or dislocation.

## 2021-01-13 ENCOUNTER — Telehealth: Payer: Self-pay

## 2021-01-13 NOTE — Telephone Encounter (Signed)
Patient is having to urinate frequently for the past week Patient daughter would like to know if there is something that can be done about this. Please advise.

## 2021-01-13 NOTE — Telephone Encounter (Signed)
Spoke with her daughter [name] and the patient is scheduled to come in tomorrow for an OV with Dr. Raliegh Ip for an evaluation of symptoms

## 2021-01-14 ENCOUNTER — Other Ambulatory Visit: Payer: Self-pay

## 2021-01-14 ENCOUNTER — Ambulatory Visit: Payer: Medicare Other | Admitting: Unknown Physician Specialty

## 2021-01-14 ENCOUNTER — Ambulatory Visit (INDEPENDENT_AMBULATORY_CARE_PROVIDER_SITE_OTHER): Payer: Medicare Other | Admitting: Family Medicine

## 2021-01-14 ENCOUNTER — Encounter: Payer: Self-pay | Admitting: Family Medicine

## 2021-01-14 VITALS — BP 118/58 | HR 89 | Ht 59.0 in | Wt 176.8 lb

## 2021-01-14 DIAGNOSIS — R350 Frequency of micturition: Secondary | ICD-10-CM

## 2021-01-14 LAB — POCT URINALYSIS DIPSTICK
Bilirubin, UA: NEGATIVE
Glucose, UA: NEGATIVE
Nitrite, UA: NEGATIVE
Protein, UA: POSITIVE — AB
Spec Grav, UA: 1.02 (ref 1.010–1.025)
Urobilinogen, UA: 0.2 E.U./dL
pH, UA: 6 (ref 5.0–8.0)

## 2021-01-14 MED ORDER — CIPROFLOXACIN HCL 500 MG PO TABS: 500.0000 mg | ORAL_TABLET | Freq: Two times a day (BID) | ORAL | 0 refills | Status: DC

## 2021-01-14 NOTE — Patient Instructions (Addendum)
Thank you for coming to the office today.  1. You have a Urinary Tract Infection - this is very common, your symptoms are reassuring and you should get better within 1 week on the antibiotics - Start Cipro 500mg  2 times daily for next 5 days, complete entire course, even if feeling better - We sent urine for a culture, we will call you within next few days if we need to change antibiotics - Please drink plenty of fluids, improve hydration over next 1 week  If symptoms worsening, developing nausea / vomiting, worsening back pain, fevers / chills / sweats, then please return for re-evaluation sooner.  If you take AZO OTC - limit this to 2-3 days MAX to avoid affecting kidneys   Please schedule a Follow-up Appointment to: Return if symptoms worsen or fail to improve, for UTI.  If you have any other questions or concerns, please feel free to call the office or send a message through [place]. You may also schedule an earlier appointment if necessary.  Additionally, you may be receiving a survey about your experience at our office within a few days to 1 week by e-mail or mail. We value your feedback.  Nobie Putnam, DO [place]

## 2021-01-14 NOTE — Progress Notes (Signed)
Subjective:    Patient ID: Chelsea [name], female    DOB: 1946-02-16, 75 y.o.   MRN: [id number]  Chelsea [name] is a 75 y.o. female presenting on 01/14/2021 for Urinary Tract Infection  Prior PCP Cyndia Skeeters, FNP   HPI  Urinary Frequency UTI Reports onset symptoms recently past 1-2 weeks with urinary frequency and some incontinence at times. She has some dysuria. Prior history of UTI. Not on recent antibiotic. PCN allergy Denies any hematuria, fever chills sweats nausea vomiting back pain   Depression screen [facility] 2/9 08/13/2020 11/23/2019 08/30/2019  Decreased Interest 0 2 2  Down, Depressed, Hopeless 0 1 1  PHQ - 2 Score 0 3 3  Altered sleeping 0 1 1  Tired, decreased energy 0 0 2  Change in appetite 0 0 0  Feeling bad or failure about yourself  0 0 0  Trouble concentrating 0 2 3  Moving slowly or fidgety/restless 0 2 2  Suicidal thoughts 0 0 0  PHQ-9 Score 0 8 11  Difficult doing work/chores Not difficult at all Not difficult at all Somewhat difficult    Social History   Tobacco Use  . Smoking status: Never Smoker  . Smokeless tobacco: Never Used  Vaping Use  . Vaping Use: Never used  Substance Use Topics  . Alcohol use: Never  . Drug use: No    Review of Systems Per HPI unless specifically indicated above     Objective:    BP (!) 118/58   Pulse 89   Ht 4\' 11"  (1.499 m)   Wt 176 lb 12.8 oz (80.2 kg)   SpO2 100%   BMI 35.71 kg/m   Wt Readings from Last 3 Encounters:  01/14/21 176 lb 12.8 oz (80.2 kg)  01/03/21 172 lb 6.4 oz (78.2 kg)  12/17/20 177 lb (80.3 kg)    Physical Exam Vitals and nursing note reviewed.  Constitutional:      General: She is not in acute distress.    Appearance: She is well-developed. She is not diaphoretic.     Comments: Well-appearing, comfortable, cooperative  HENT:     Head: Normocephalic and atraumatic.  Eyes:     General:        Right eye: No discharge.        Left eye: No discharge.     Conjunctiva/sclera:  Conjunctivae normal.  Cardiovascular:     Rate and Rhythm: Normal rate.  Pulmonary:     Effort: Pulmonary effort is normal.  Skin:    General: Skin is warm and dry.     Findings: No erythema or rash.  Neurological:     Mental Status: She is alert and oriented to person, place, and time.  Psychiatric:        Behavior: Behavior normal.     Comments: Well groomed, good eye contact, normal speech and thoughts       Results for orders placed or performed in visit on 01/14/21  POCT Urinalysis Dipstick  Result Value Ref Range   Color, UA Orange    Clarity, UA Cloudy    Glucose, UA Negative Negative   Bilirubin, UA Negative    Ketones, UA Trace    Spec Grav, UA 1.020 1.010 - 1.025   Blood, UA Large +++    pH, UA 6.0 5.0 - 8.0   Protein, UA Positive (A) Negative   Urobilinogen, UA 0.2 0.2 or 1.0 E.U./dL   Nitrite, UA Negative    Leukocytes, UA Large (3+) (  A) Negative   Appearance     Odor        Assessment & Plan:   Problem List Items Addressed This Visit   None   Visit Diagnoses    Urinary frequency    -  Primary   Relevant Medications   ciprofloxacin (CIPRO) 500 MG tablet   Other Relevant Orders   POCT Urinalysis Dipstick (Completed)   Urine Culture      Clinically consistent with UTI and confirmed on UA. No recent UTIs or abx courses.  No concern for pyelo today (no systemic symptoms, neg fever, back pain, n/v).  Plan: 1. Urine dipstick 2. Ordered Urine culture 3. Cipro 500 BID x 5 days, has PCN allergy 4. Improve PO hydration If not improving we can consider Urology referral given some incontinence symptoms.   Meds ordered this encounter  Medications  . ciprofloxacin (CIPRO) 500 MG tablet    Sig: Take 1 tablet (500 mg total) by mouth 2 (two) times daily.    Dispense:  10 tablet    Refill:  0     Follow up plan: Return if symptoms worsen or fail to improve, for UTI.   Nobie Putnam, [facility] 01/14/2021, 2:28 PM

## 2021-01-15 ENCOUNTER — Telehealth: Payer: Self-pay | Admitting: Adult Health Nurse Practitioner

## 2021-01-15 LAB — URINE CULTURE
MICRO NUMBER:: 11760293
SPECIMEN QUALITY:: ADEQUATE

## 2021-01-15 NOTE — Telephone Encounter (Signed)
Rec'd message that patient called and needed to reschedule the Palliative Consult for 01/28/21.  Spoke with patient and she has a MD appointment that day and we have rescheduled the Consult for 01/27/21 @ 11 AM.

## 2021-01-16 ENCOUNTER — Other Ambulatory Visit: Payer: Self-pay

## 2021-01-16 ENCOUNTER — Ambulatory Visit
Admission: RE | Admit: 2021-01-16 | Discharge: 2021-01-16 | Disposition: A | Discharge status: Home / Self Care | Payer: Medicare Other | Source: Ambulatory Visit | Attending: Unknown Physician Specialty | Admitting: Unknown Physician Specialty

## 2021-01-16 DIAGNOSIS — Z1231 Encounter for screening mammogram for malignant neoplasm of breast: Secondary | ICD-10-CM | POA: Diagnosis not present

## 2021-01-16 IMAGING — MG MM DIGITAL SCREENING BILAT W/ TOMO AND CAD
6 of 10 series · 6 of 30 positions shown · non-contrast
Comparison: Previous exam(s).

CLINICAL DATA: Screening.

EXAM:
DIGITAL SCREENING BILATERAL MAMMOGRAM WITH TOMOSYNTHESIS AND CAD
TECHNIQUE: Bilateral screening digital craniocaudal and mediolateral oblique
mammograms were obtained. Bilateral screening digital breast
tomosynthesis was performed. The images were evaluated with
computer-aided detection.

[L CC synth-2D]
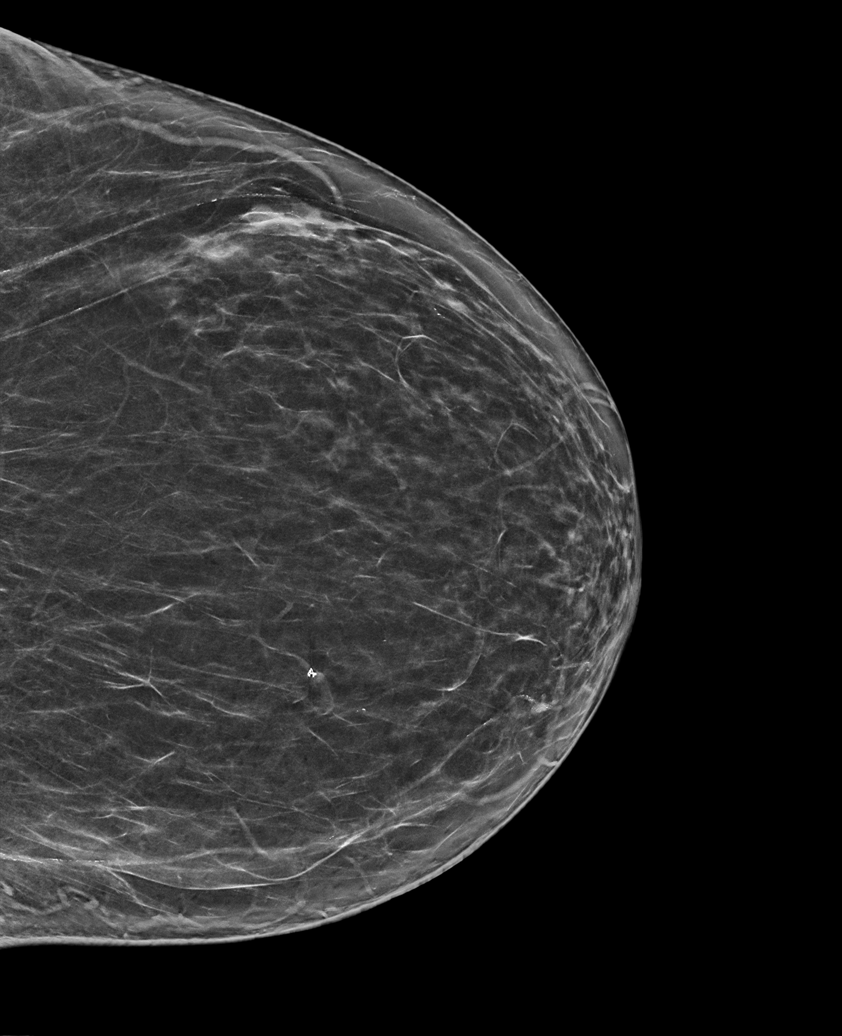

[R CC synth-2D (1 of 2)]
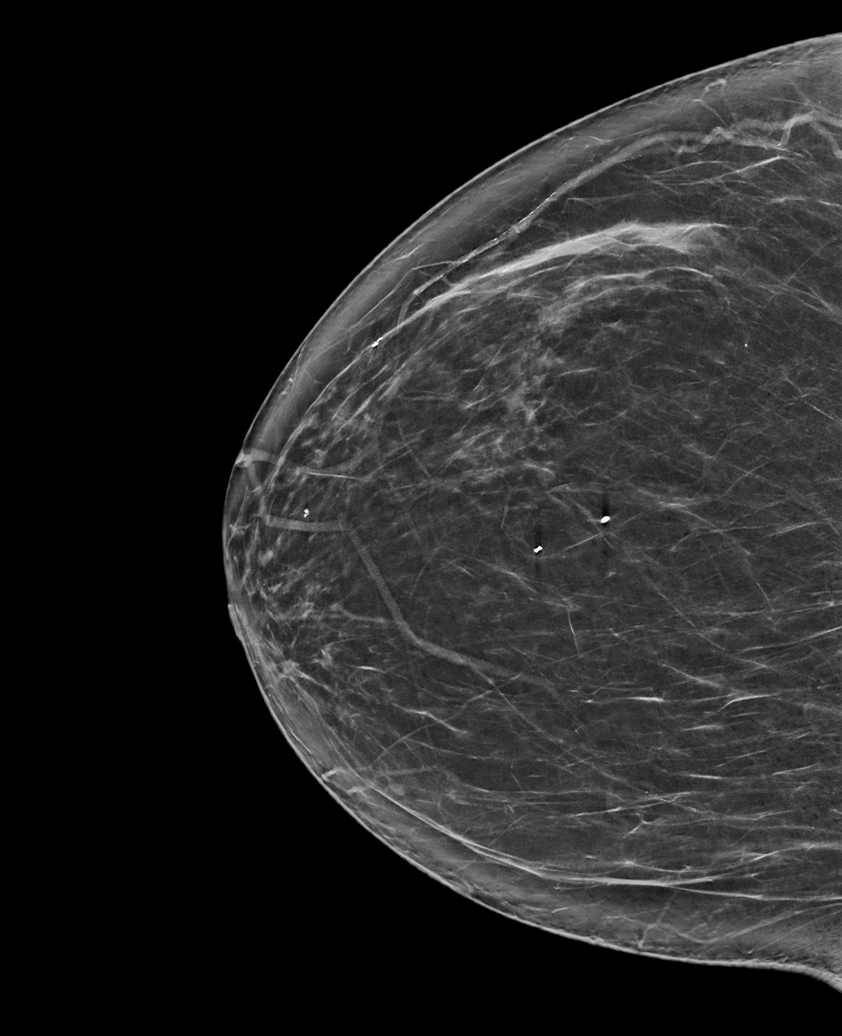

[R CC synth-2D (2 of 2)]
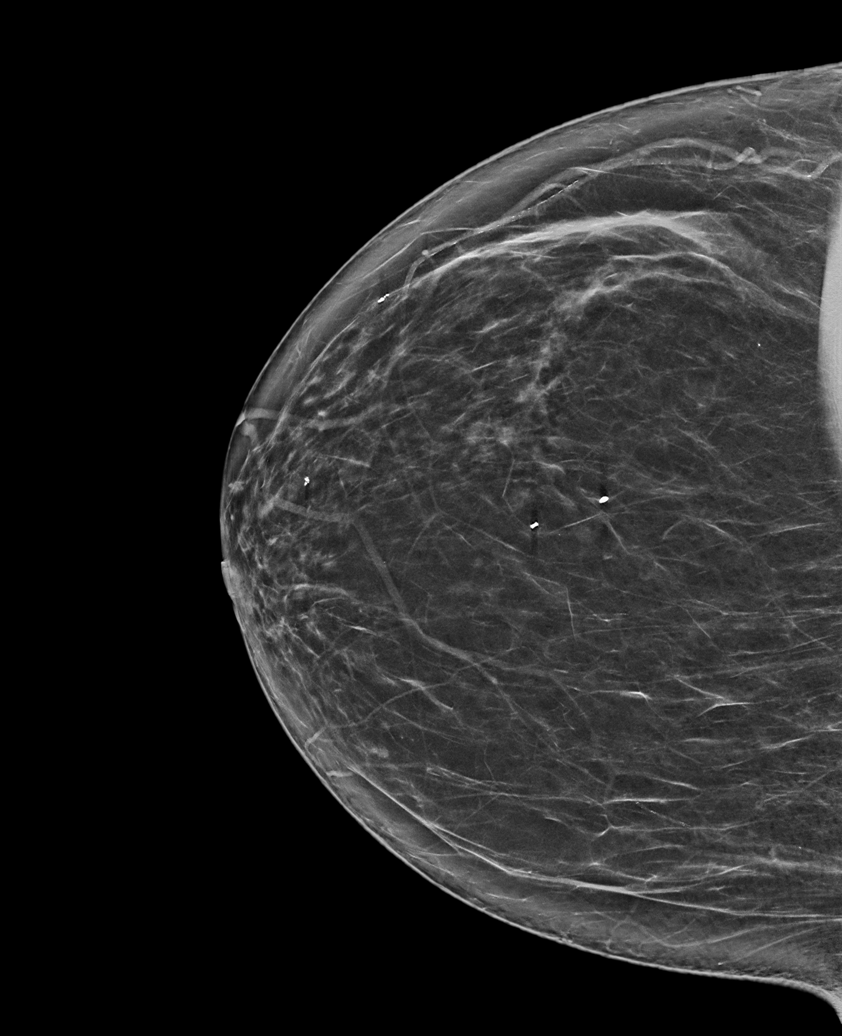

[L MLO synth-2D]
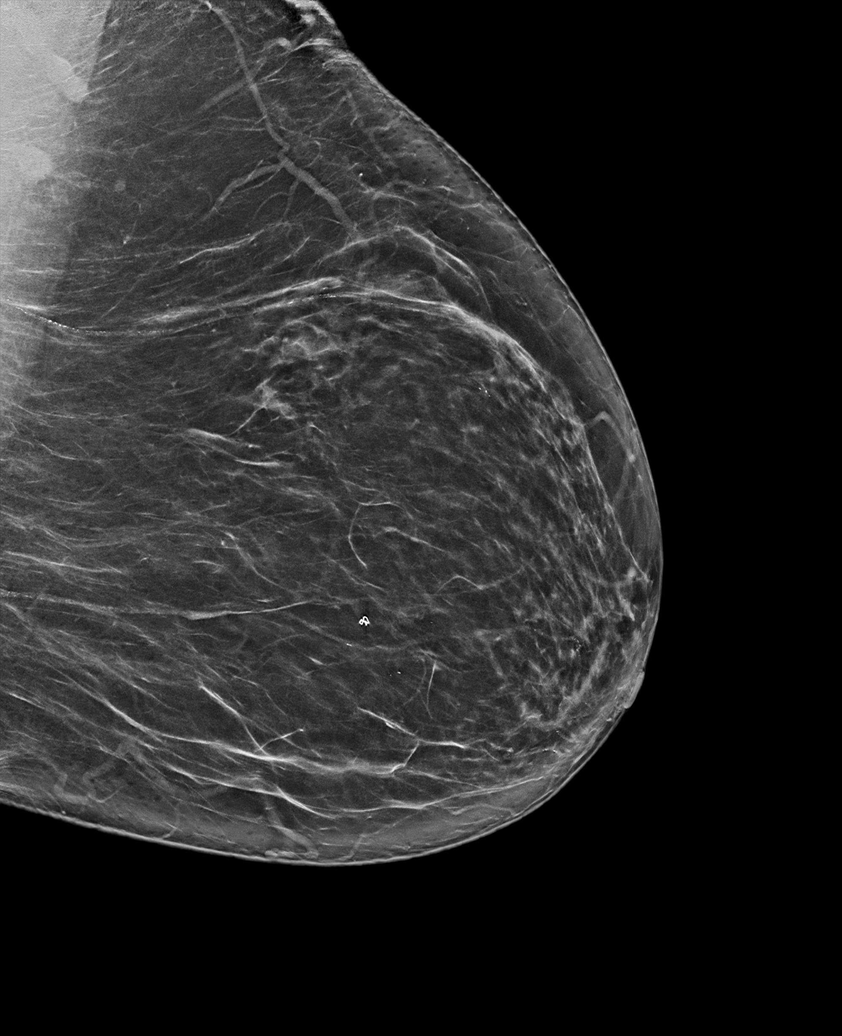

[R MLO synth-2D]
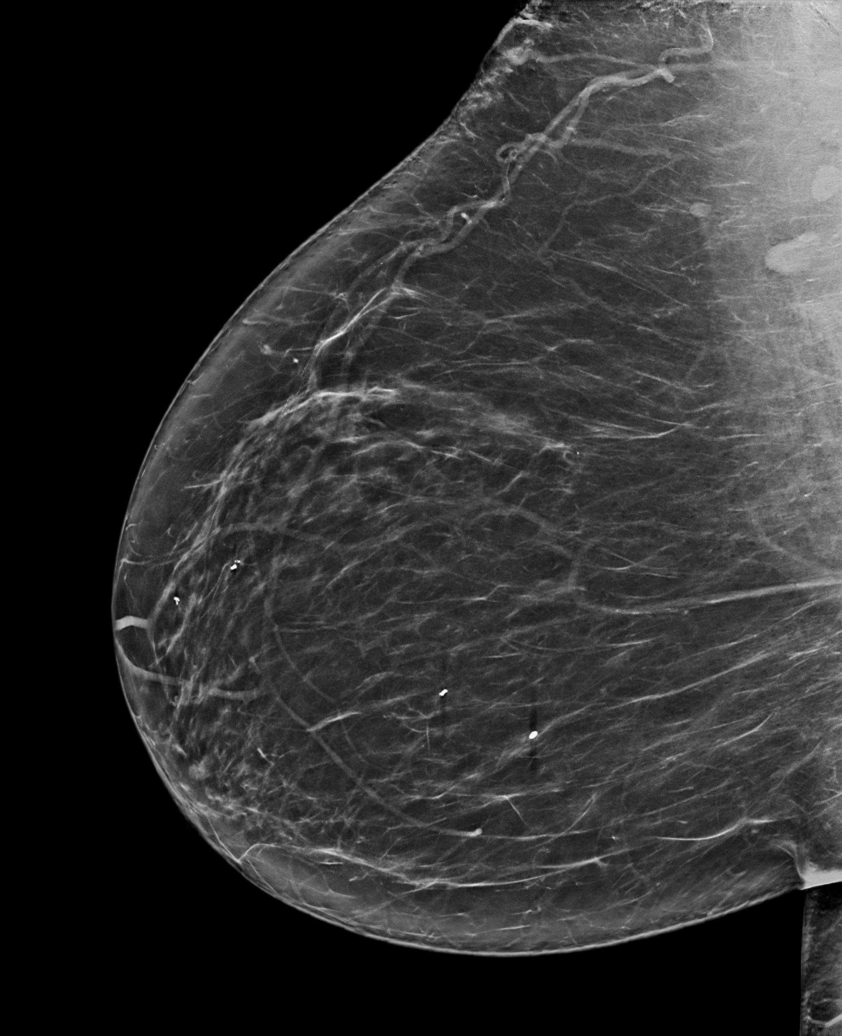

[R CC tomo · tomo slice 43/85.0]
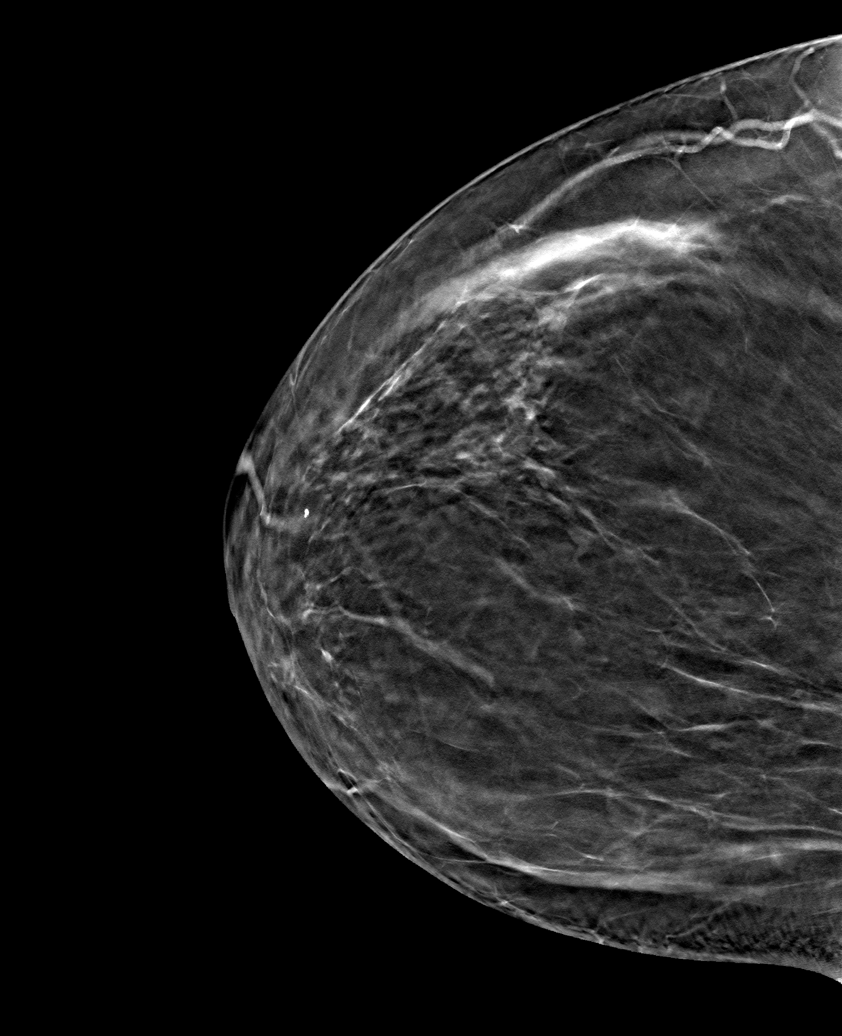

[6 of 30 positions shown; findings below may reference images not displayed]

ACR Breast Density Category b: There are scattered areas of
fibroglandular density.
FINDINGS: There are no findings suspicious for malignancy. The images were
evaluated with computer-aided detection.
IMPRESSION: No mammographic evidence of malignancy. A result letter of this
screening mammogram will be mailed directly to the patient.

RECOMMENDATION:
Screening mammogram in one year. (Code:[OD])

BI-RADS CATEGORY  1: Negative.

## 2021-01-21 ENCOUNTER — Ambulatory Visit: Payer: Medicare Other | Admitting: Unknown Physician Specialty

## 2021-01-21 ENCOUNTER — Other Ambulatory Visit: Payer: Self-pay

## 2021-01-21 DIAGNOSIS — R350 Frequency of micturition: Secondary | ICD-10-CM

## 2021-01-21 NOTE — Telephone Encounter (Signed)
Copied from [place] [id number]. Topic: General - Other >> Jan 21, 2021  9:01 AM Leward Quan A wrote: Reason for CRM: Patient called in to tell Dr Raliegh Ip that she is still having the frequent urination not able to make it to the bathroom. Per patient she does not have a way to come into the office and need some more antibiotics asking for a call back at Ph# [phone] >> Jan 21, 2021  9:13 AM Celene Kras wrote: Pt called and is requesting to disregard this message. Please advise.

## 2021-01-27 ENCOUNTER — Telehealth: Payer: Self-pay | Admitting: Adult Health Nurse Practitioner

## 2021-01-27 ENCOUNTER — Other Ambulatory Visit: Payer: Self-pay

## 2021-01-27 ENCOUNTER — Other Ambulatory Visit: Payer: Medicare Other | Admitting: Adult Health Nurse Practitioner

## 2021-01-27 NOTE — Telephone Encounter (Signed)
Spoke with daughter to remind of today's appt.  Needed to reschedule.  Rescheduled for 02/03/21 at 11am [place] K. Olena Heckle NP

## 2021-01-28 ENCOUNTER — Ambulatory Visit (INDEPENDENT_AMBULATORY_CARE_PROVIDER_SITE_OTHER): Payer: Medicare Other | Admitting: Internal Medicine

## 2021-01-28 ENCOUNTER — Other Ambulatory Visit: Payer: Self-pay

## 2021-01-28 ENCOUNTER — Other Ambulatory Visit: Payer: Self-pay | Admitting: Adult Health Nurse Practitioner

## 2021-01-28 ENCOUNTER — Encounter: Payer: Self-pay | Admitting: Internal Medicine

## 2021-01-28 VITALS — BP 134/62 | HR 75 | Temp 97.5°F | Resp 18 | Ht 59.0 in | Wt 188.0 lb

## 2021-01-28 DIAGNOSIS — F411 Generalized anxiety disorder: Secondary | ICD-10-CM

## 2021-01-28 DIAGNOSIS — R531 Weakness: Secondary | ICD-10-CM | POA: Diagnosis not present

## 2021-01-28 DIAGNOSIS — R0602 Shortness of breath: Secondary | ICD-10-CM | POA: Diagnosis not present

## 2021-01-28 DIAGNOSIS — G959 Disease of spinal cord, unspecified: Secondary | ICD-10-CM

## 2021-01-28 DIAGNOSIS — F3341 Major depressive disorder, recurrent, in partial remission: Secondary | ICD-10-CM | POA: Diagnosis not present

## 2021-01-28 DIAGNOSIS — I10 Essential (primary) hypertension: Secondary | ICD-10-CM

## 2021-01-28 DIAGNOSIS — M4807 Spinal stenosis, lumbosacral region: Secondary | ICD-10-CM

## 2021-01-28 DIAGNOSIS — D649 Anemia, unspecified: Secondary | ICD-10-CM

## 2021-01-28 DIAGNOSIS — N3946 Mixed incontinence: Secondary | ICD-10-CM

## 2021-01-28 DIAGNOSIS — I872 Venous insufficiency (chronic) (peripheral): Secondary | ICD-10-CM

## 2021-01-28 DIAGNOSIS — E7800 Pure hypercholesterolemia, unspecified: Secondary | ICD-10-CM

## 2021-01-28 MED ORDER — POLYETHYLENE GLYCOL 3350 17 GM/SCOOP PO POWD: 17.0000 g | Freq: Every day | ORAL | 1 refills | Status: DC

## 2021-01-28 NOTE — Assessment & Plan Note (Signed)
With right side weakness Discussing surgery with Dr. Cari Caraway Will follow

## 2021-01-28 NOTE — Progress Notes (Signed)
Subjective:    Patient ID: Chelsea [name], female    DOB: 01/15/1946, 75 y.o.   MRN: [id number]  HPI  Patient presents the clinic today for follow-up of chronic conditions.  She is establishing care with me today, transferring care from [facility], NP.  HTN: Her BP today is 134/62.  She is taking Triamterene HCT as prescribed.  She reports her swelling has not improved since restarting this medication. She also reports some mild shortness of breath with exertion.  She reports she consumes a low salt diet. ECG from 11/2020 reviewed. She has no history of heart failure, no Echo for review in Epic.  HLD: Her last LDL was 159, 02/2020.  She denies myalgias on Atorvastatin.  She tries to consume a low-fat diet.  Anxiety and Depression: Managed on Fluoxetine and Xanax.  She is following with palliative care.  She is not currently seeing a therapist.  She denies SI/HI. There is no CSA on file.  Cervical Myelopathy/Lumbar Spinal Stenosis: She reports right arm weakness. Pain managed with Tylenol, Voltaren gel, Lidoderm patches and Gabapentin. She follows with Dr. Cari Caraway.  Iron Deficiency Anemia: Her last H/H was 10.8/33.2, 11/2020.  She is not currently taking oral iron OTC.  She reports generalized weakness but denies cold intolerance, shortness of breath or S/S of bleeding.  She reports mixed incontinence. She was recently treated for a UTI but reports the symptoms persist. Her urine culture did not show significant UTI, rather colonization.  She reports she sustained a fall 5 days ago. She was trying to get up from the toilet and her left leg gave out from under her. EMS came and had to get her up. She reports bruising and pain to her LLE.  She also reports constipation. She reports she had a small BM today after not having a BM for 3 days. She reports she has never been regular. She denies nausea, vomiting, diarrhea or blood in her stool. She has not taken any medication OTC for  this.  Review of Systems  Past Medical History:  Diagnosis Date  . Anxiety   . Arthritis   . Depression   . Hyperlipidemia   . Hypertension   . Seizures ([place])   . Spinal stenosis   . Ulcer   . Urinary incontinence     Current Outpatient Medications  Medication Sig Dispense Refill  . acetaminophen (TYLENOL) 500 MG tablet Take 500 mg by mouth every 6 (six) hours as needed.    . ALPRAZolam (XANAX) 0.5 MG tablet Take 1 tablet (0.5 mg total) by mouth 2 (two) times daily as needed for anxiety. 60 tablet 2  . aspirin 325 MG tablet Take 325 mg by mouth daily.    Marland Kitchen atorvastatin (LIPITOR) 40 MG tablet Take 1 tablet (40 mg total) by mouth daily. (Patient not taking: No sig reported) 90 tablet 3  . ciprofloxacin (CIPRO) 500 MG tablet Take 1 tablet (500 mg total) by mouth 2 (two) times daily. 10 tablet 0  . Cyanocobalamin (VITAMIN B 12 PO) Take 500 mcg by mouth daily. (Patient not taking: No sig reported)    . diclofenac Sodium (VOLTAREN) 1 % GEL Apply 4 g topically 4 (four) times daily. 4 g 0  . FLUoxetine (PROZAC) 20 MG capsule Take 60 mg by mouth every morning.    . gabapentin (NEURONTIN) 100 MG capsule Take 2 capsules (200 mg total) by mouth 2 (two) times daily. 360 capsule 1  . lidocaine (LIDODERM) 5 % Place  1 patch onto the skin daily. Remove & Discard patch within 12 hours or as directed by MD 1 patch 0  . triamterene-hydrochlorothiazide (MAXZIDE-25) 37.5-25 MG tablet Take 1 tablet by mouth daily. 90 tablet 1   No current facility-administered medications for this visit.    Allergies  Allergen Reactions  . Penicillins     "Everything turned black"    Family History  Problem Relation Age of Onset  . Arthritis Mother   . Heart disease Mother   . Stroke Mother   . Hypertension Mother   . COPD Mother   . Sudden death Sister   . Other Father        unknown medical history  . Breast cancer Neg Hx     Social History   Socioeconomic History  . Marital status: Divorced     Spouse name: Not on file  . Number of children: Not on file  . Years of education: Not on file  . Highest education level: Not on file  Occupational History  . Not on file  Tobacco Use  . Smoking status: Never Smoker  . Smokeless tobacco: Never Used  Vaping Use  . Vaping Use: Never used  Substance and Sexual Activity  . Alcohol use: Never  . Drug use: No  . Sexual activity: Not on file  Other Topics Concern  . Not on file  Social History Narrative  . Not on file   Social Determinants of Health   Financial Resource Strain: Not on file  Food Insecurity: Not on file  Transportation Needs: Not on file  Physical Activity: Not on file  Stress: Not on file  Social Connections: Not on file  Intimate Partner Violence: Not on file     Constitutional: Denies fever, malaise, fatigue, headache or abrupt weight changes.  HEENT: Denies eye pain, eye redness, ear pain, ringing in the ears, wax buildup, runny nose, nasal congestion, bloody nose, or sore throat. Respiratory: Pt reports intermittent shortness of breath with exertion. Denies difficulty breathing, cough or sputum production.   Cardiovascular: Pt reports swelling in legs. Denies chest pain, chest tightness, palpitations or swelling in the hands Gastrointestinal: Pt reports constipation. Denies abdominal pain, bloating, diarrhea or blood in the stool.  GU: Pt reports mixed incontinence. Denies  pain with urination, burning sensation, blood in urine, odor or discharge. Musculoskeletal: Pt reports right arm weakness, difficulty with gait. Denies muscle pain or joint swelling.  Skin: Denies redness, rashes, lesions or ulcercations.  Neurological: Pt reports difficulty with balance. Denies dizziness, difficulty with memory, difficulty with speech or problems with coordination.  Psych: Patient has a history of anxiety and depression.  Denies SI/HI.  No other specific complaints in a complete review of systems (except as listed in HPI  above).     Objective:   Physical Exam  BP 134/62 (BP Location: Right Arm, Patient Position: Sitting, Cuff Size: Normal)   Pulse 75   Temp (!) 97.5 F (36.4 C) (Temporal)   Resp 18   Ht 4\' 11"  (1.499 m)   Wt 188 lb (85.3 kg)   SpO2 96%   BMI 37.97 kg/m   Wt Readings from Last 3 Encounters:  01/14/21 176 lb 12.8 oz (80.2 kg)  01/03/21 172 lb 6.4 oz (78.2 kg)  12/17/20 177 lb (80.3 kg)    General: Appears her stated age, obese, chronically ill appearing, in NAD. Skin: Warm, dry and intact. No rashes noted. HEENT: Head: normal shape and size; Eyes: sclera white, EOMs intact;  Neck:  Neck supple, trachea midline. No masses, lumps or thyromegaly present.  Cardiovascular: Normal rate and rhythm. S1,S2 noted.  No murmur, rubs or gallops noted. No JVD. 1+ pitting BLE edema. No carotid bruits noted. Pulmonary/Chest: Normal effort and positive vesicular breath sounds. No respiratory distress. No wheezes, rales or ronchi noted.  Abdomen: Soft and nontender. Normal bowel sounds. No distention or masses noted.  Musculoskeletal: Right arm weakness noted. Gait slow and steady with use of rolling walker. Neurological: Alert and oriented.  Psychiatric: Mood and affect normal.   BMET    Component Value Date/Time   NA 140 11/07/2020 1118   K 3.3 (L) 11/07/2020 1118   CL 100 11/07/2020 1118   CO2 29 11/07/2020 1118   GLUCOSE 106 (H) 11/07/2020 1118   BUN 19 11/07/2020 1118   CREATININE 0.85 11/07/2020 1118   CREATININE 0.87 08/13/2020 1129   CALCIUM 9.3 11/07/2020 1118   GFRNONAA >60 11/07/2020 1118   GFRNONAA 65 08/13/2020 1129   GFRAA 76 08/13/2020 1129    Lipid Panel     Component Value Date/Time   CHOL 238 (H) 02/26/2020 1130   TRIG 84 02/26/2020 1130   HDL 60 02/26/2020 1130   CHOLHDL 4.0 02/26/2020 1130   VLDL 18.4 02/08/2013 1638   LDLCALC 159 (H) 02/26/2020 1130    CBC    Component Value Date/Time   WBC 14.4 (H) 11/07/2020 1118   RBC 3.53 (L) 11/07/2020 1118    HGB 10.8 (L) 11/07/2020 1118   HCT 33.2 (L) 11/07/2020 1118   PLT 206 11/07/2020 1118   MCV 94.1 11/07/2020 1118   MCH 30.6 11/07/2020 1118   MCHC 32.5 11/07/2020 1118   RDW 14.6 11/07/2020 1118   LYMPHSABS 1.8 11/07/2020 1118   MONOABS 1.6 (H) 11/07/2020 1118   EOSABS 0.0 11/07/2020 1118   BASOSABS 0.0 11/07/2020 1118    Hgb A1C No results found for: HGBA1C         Assessment & Plan:   Constipation:  Start daily Miralax, RX sent to pharmacy Encouraged adequate water intake, high fiber diet  Will follow up after imaging, return precautions discussed  Webb Silversmith, NP. This visit occurred during the SARS-CoV-2 public health emergency.  Safety protocols were in place, including screening questions prior to the visit, additional usage of staff PPE, and extensive cleaning of exam room while observing appropriate contact time as indicated for disinfecting solutions.

## 2021-01-28 NOTE — Assessment & Plan Note (Signed)
CBC reviewed She is not taking oral iron Will monitor

## 2021-01-28 NOTE — Assessment & Plan Note (Signed)
Controlled on Triamterene HCT but given persistent swelling, will consider changing to Furosemide pending Echo BMET reviewed Reinforced DASH diet

## 2021-01-28 NOTE — Patient Instructions (Signed)
Start Mirilax daily, I have sent this to your pharmacy. Someone should call you to set you up for the Echo of your heart. I will follow up with you after I get these results.   Constipation, Adult Constipation is when a person has trouble pooping (having a bowel movement). When you have this condition, you may poop fewer than 3 times a week. Your poop (stool) may also be dry, hard, or bigger than normal. Follow these instructions at home: Eating and drinking  Eat foods that have a lot of fiber, such as: ? Fresh fruits and vegetables. ? Whole grains. ? Beans.  Eat less of foods that are low in fiber and high in fat and sugar, such as: ? [place] fries. ? Hamburgers. ? Cookies. ? Candy. ? Soda.  Drink enough fluid to keep your pee (urine) pale yellow.   General instructions  Exercise regularly or as told by your doctor. Try to do 150 minutes of exercise each week.  Go to the restroom when you feel like you need to poop. Do not hold it in.  Take over-the-counter and prescription medicines only as told by your doctor. These include any fiber supplements.  When you poop: ? Do deep breathing while relaxing your lower belly (abdomen). ? Relax your pelvic floor. The pelvic floor is a group of muscles that support the rectum, bladder, and intestines (as well as the uterus in women).  Watch your condition for any changes. Tell your doctor if you notice any.  Keep all follow-up visits as told by your doctor. This is important. Contact a doctor if:  You have pain that gets worse.  You have a fever.  You have not pooped for 4 days.  You vomit.  You are not hungry.  You lose weight.  You are bleeding from the opening of the butt (anus).  You have thin, pencil-like poop. Get help right away if:  You have a fever, and your symptoms suddenly get worse.  You leak poop or have blood in your poop.  Your belly feels hard or bigger than normal (bloated).  You have very bad belly  pain.  You feel dizzy or you faint. Summary  Constipation is when a person poops fewer than 3 times a week, has trouble pooping, or has poop that is dry, hard, or bigger than normal.  Eat foods that have a lot of fiber.  Drink enough fluid to keep your pee (urine) pale yellow.  Take over-the-counter and prescription medicines only as told by your doctor. These include any fiber supplements. This information is not intended to replace advice given to you by your health care provider. Make sure you discuss any questions you have with your health care provider. Document Revised: 08/09/2019 Document Reviewed: 08/09/2019 Elsevier Patient Education  [place].

## 2021-01-28 NOTE — Assessment & Plan Note (Signed)
Continue Tylenol, Voltaren Gel, Lidoderm patches and Gabapentin per Dr. Cari Caraway Will follow

## 2021-01-28 NOTE — Assessment & Plan Note (Signed)
Could consider Oxybutynin but want to get results from Echo first

## 2021-01-28 NOTE — Assessment & Plan Note (Signed)
Lipid profile reviewed Encouraged her to consume a low-fat diet Continue Atorvastatin 

## 2021-01-28 NOTE — Assessment & Plan Note (Signed)
Persistent Continue Fluoxetine and Xanax Controlled substance agreement signed today Support offered 

## 2021-01-28 NOTE — Assessment & Plan Note (Signed)
Persistent Continue Fluoxetine and Xanax Controlled substance agreement signed today Support offered

## 2021-01-29 ENCOUNTER — Telehealth: Payer: Self-pay

## 2021-01-29 NOTE — Telephone Encounter (Signed)
Copied from [place] [phone]. Topic: General - Other >> Jan 29, 2021  1:29 PM Keene Breath wrote: Reason for CRM: Patient states she is returning a call to the office.  She was not sure why she was getting  the call. Please call back at [phone]

## 2021-01-31 ENCOUNTER — Telehealth: Payer: Self-pay | Admitting: Internal Medicine

## 2021-01-31 ENCOUNTER — Telehealth: Payer: Self-pay | Admitting: Adult Health Nurse Practitioner

## 2021-01-31 NOTE — Telephone Encounter (Signed)
Copied from [place] [id number]. Topic: Quick Communication - Home Health Verbal Orders >> Jan 31, 2021 11:03 AM Jodie Echevaria wrote: Caller/Agency: [facility] Number: [phone] ok to LM  Requesting OT/PT/Skilled Nursing/Social Work/Speech Therapy: OT  Frequency: 2 w 2, 1 w 6

## 2021-01-31 NOTE — Telephone Encounter (Signed)
Called to remind of Monday's appointment.  Left VM with reason for call and call back info Makinzee Durley K. Darneisha Windhorst NP 

## 2021-01-31 NOTE — Telephone Encounter (Signed)
Called Chelsea [name] gave her verbal orders. She verbalized understanding.  KP

## 2021-02-03 ENCOUNTER — Emergency Department
Admission: EM | Admit: 2021-02-03 | Discharge: 2021-02-03 | Disposition: A | Discharge status: Home / Self Care | Payer: Medicare Other | Attending: Emergency Medicine | Admitting: Emergency Medicine

## 2021-02-03 ENCOUNTER — Other Ambulatory Visit: Payer: Medicare Other | Admitting: Adult Health Nurse Practitioner

## 2021-02-03 ENCOUNTER — Encounter: Payer: Self-pay | Admitting: Adult Health Nurse Practitioner

## 2021-02-03 ENCOUNTER — Ambulatory Visit: Payer: Self-pay | Admitting: *Deleted

## 2021-02-03 ENCOUNTER — Emergency Department: Payer: Medicare Other

## 2021-02-03 ENCOUNTER — Encounter: Payer: Self-pay | Admitting: Emergency Medicine

## 2021-02-03 ENCOUNTER — Other Ambulatory Visit: Payer: Self-pay

## 2021-02-03 VITALS — BP 138/68 | HR 88 | Temp 97.9°F

## 2021-02-03 DIAGNOSIS — R350 Frequency of micturition: Secondary | ICD-10-CM

## 2021-02-03 DIAGNOSIS — R609 Edema, unspecified: Secondary | ICD-10-CM

## 2021-02-03 DIAGNOSIS — R600 Localized edema: Secondary | ICD-10-CM | POA: Insufficient documentation

## 2021-02-03 DIAGNOSIS — I10 Essential (primary) hypertension: Secondary | ICD-10-CM | POA: Diagnosis not present

## 2021-02-03 DIAGNOSIS — Z79899 Other long term (current) drug therapy: Secondary | ICD-10-CM | POA: Insufficient documentation

## 2021-02-03 DIAGNOSIS — M7989 Other specified soft tissue disorders: Secondary | ICD-10-CM | POA: Diagnosis not present

## 2021-02-03 DIAGNOSIS — Z7982 Long term (current) use of aspirin: Secondary | ICD-10-CM | POA: Diagnosis not present

## 2021-02-03 DIAGNOSIS — Z515 Encounter for palliative care: Secondary | ICD-10-CM

## 2021-02-03 DIAGNOSIS — N3946 Mixed incontinence: Secondary | ICD-10-CM

## 2021-02-03 LAB — CBC
HCT: 36.6 % (ref 36.0–46.0)
Hemoglobin: 11.7 g/dL — ABNORMAL LOW (ref 12.0–15.0)
MCH: 29.6 pg (ref 26.0–34.0)
MCHC: 32 g/dL (ref 30.0–36.0)
MCV: 92.7 fL (ref 80.0–100.0)
Platelets: 210 10*3/uL (ref 150–400)
RBC: 3.95 MIL/uL (ref 3.87–5.11)
RDW: 14.2 % (ref 11.5–15.5)
WBC: 5 10*3/uL (ref 4.0–10.5)
nRBC: 0 % (ref 0.0–0.2)

## 2021-02-03 LAB — BASIC METABOLIC PANEL
Anion gap: 8 (ref 5–15)
BUN: 14 mg/dL (ref 8–23)
CO2: 30 mmol/L (ref 22–32)
Calcium: 9.4 mg/dL (ref 8.9–10.3)
Chloride: 102 mmol/L (ref 98–111)
Creatinine, Ser: 0.74 mg/dL (ref 0.44–1.00)
GFR, Estimated: >60 mL/min (ref >60)
Glucose, Bld: 102 mg/dL — ABNORMAL HIGH (ref 70–99)
Potassium: 3.5 mmol/L (ref 3.5–5.1)
Sodium: 140 mmol/L (ref 135–145)

## 2021-02-03 IMAGING — CR DG CHEST 2V
1 series · 2 of 2 positions shown · non-contrast
Comparison: [DATE]

CLINICAL DATA: Leg swelling

EXAM:
CHEST - 2 VIEW

[Series 3: w chest lat · 0.14mm/px · 2 of 2 slices shown]
[im 1/2]
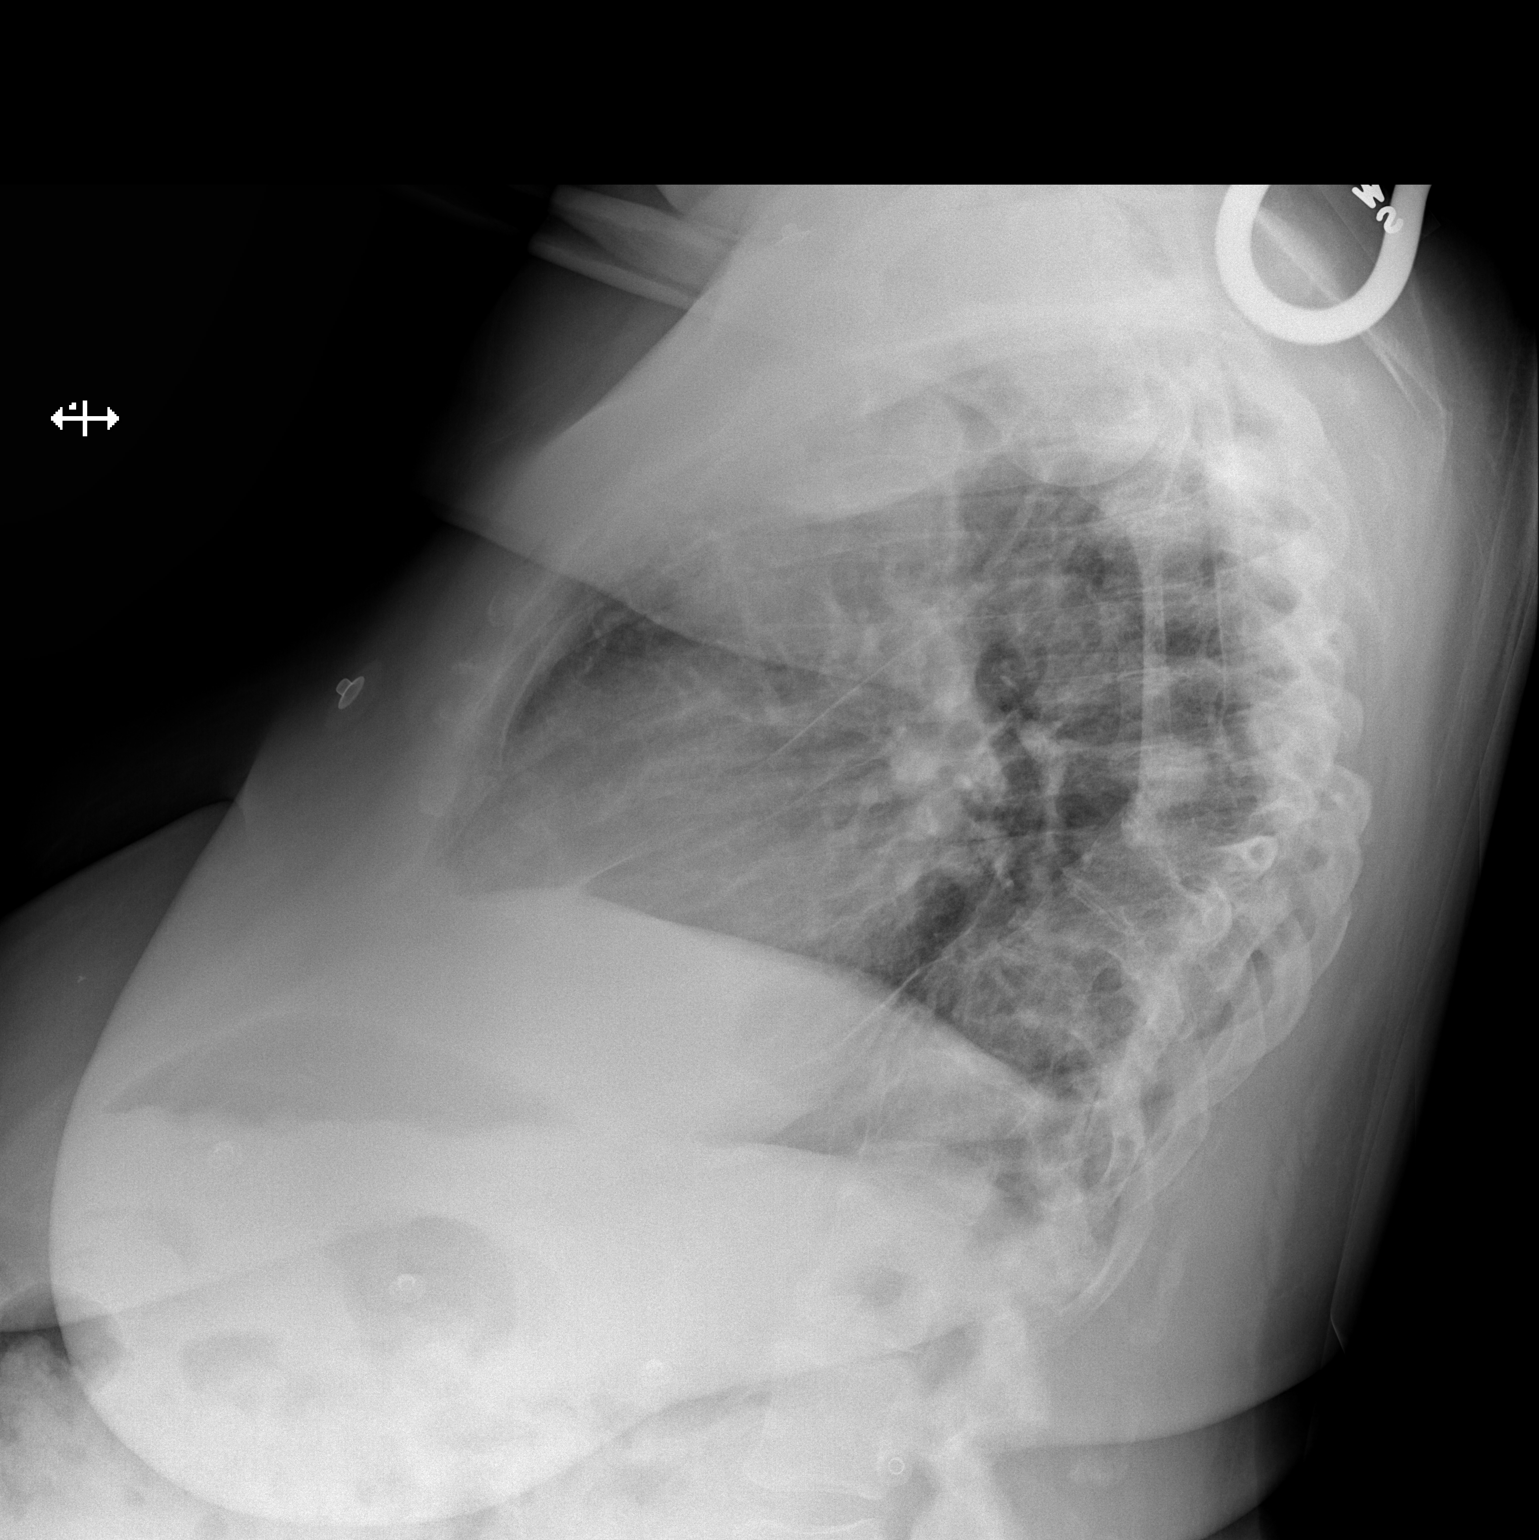
[im 2/2]
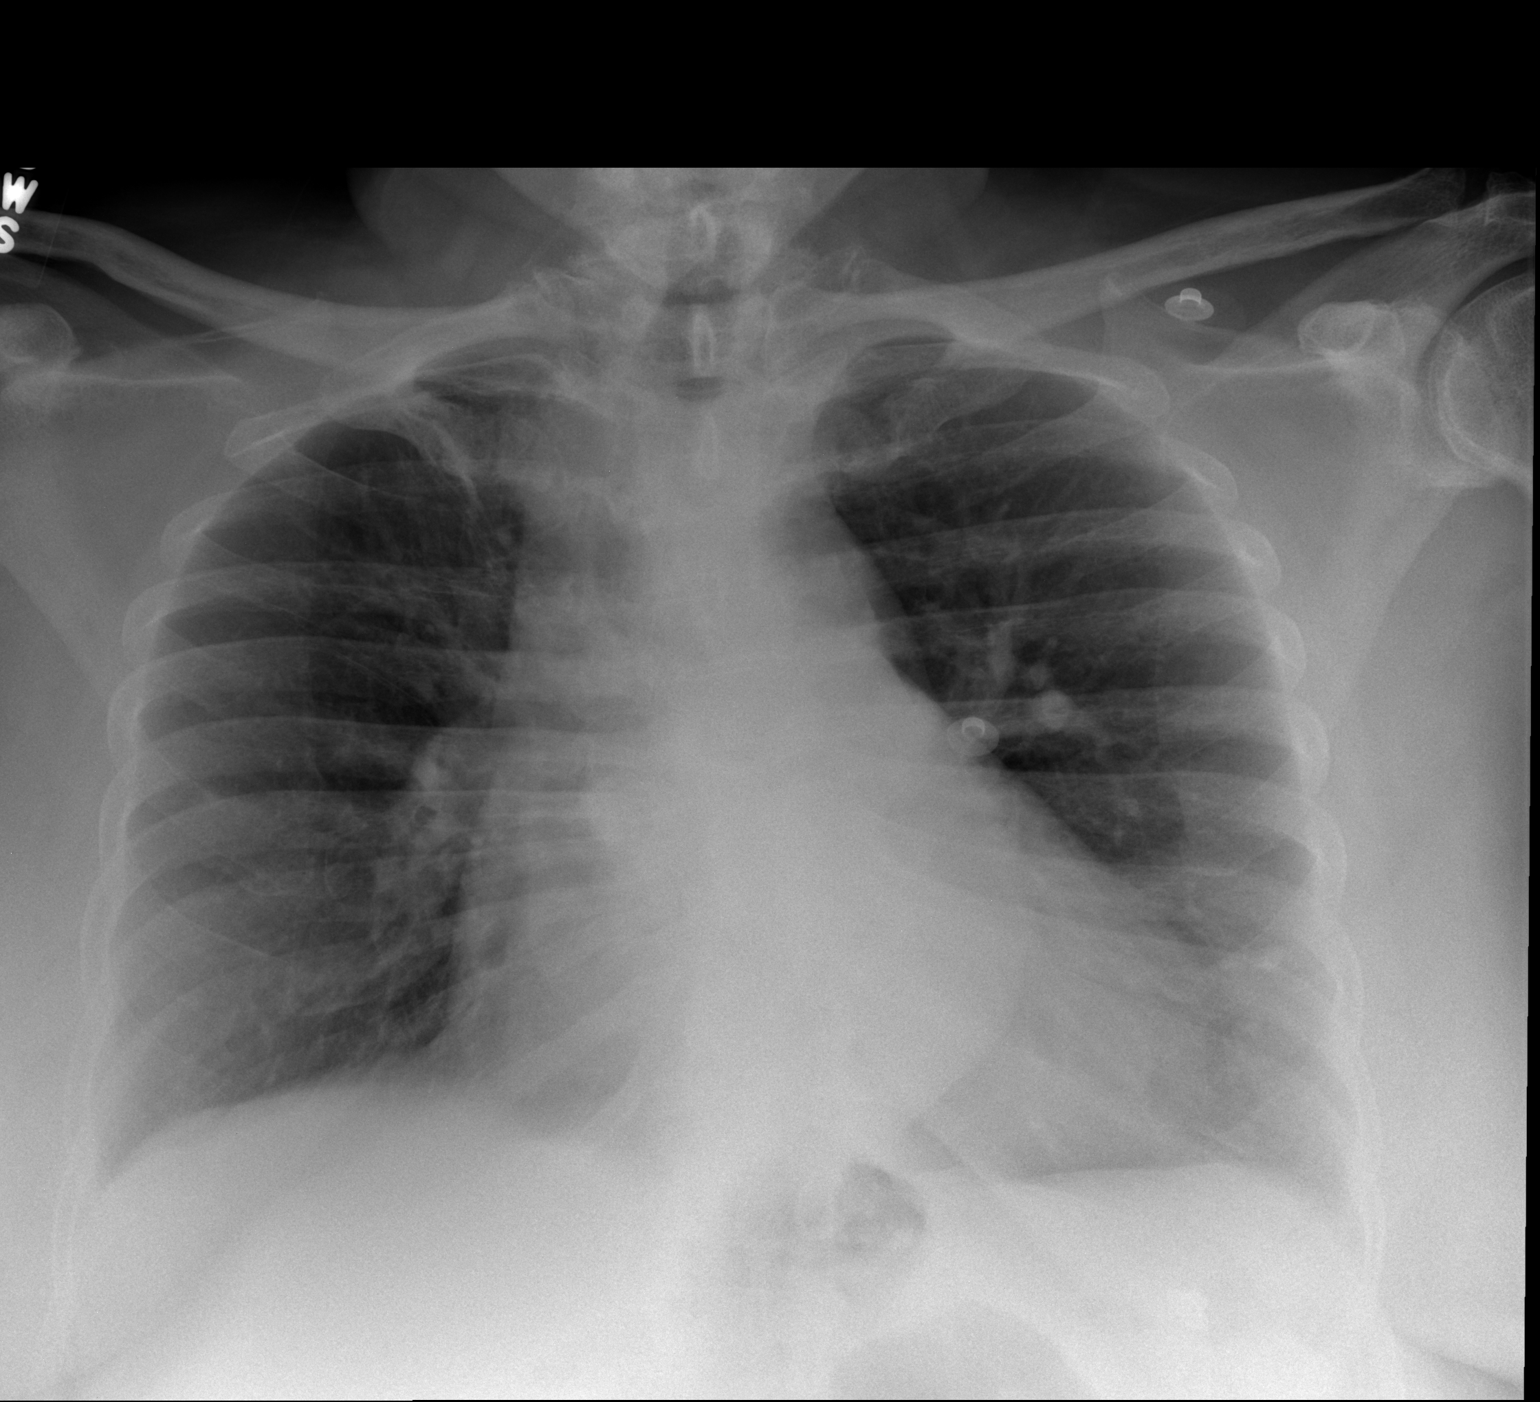

[2 of 2 positions shown; findings below may reference images not displayed]

FINDINGS: Linear scarring or atelectasis in the right upper lobe. No
consolidation or pneumothorax. Mild cardiomegaly with suspected
trace right pleural effusion. No overt pulmonary edema. Nodular
opacity projecting over the mid to lower spine on lateral view.
IMPRESSION: No active cardiopulmonary disease.

Nodular opacity projecting over the spine on lateral view. Consider
CT for further evaluation.

## 2021-02-03 NOTE — Telephone Encounter (Signed)
Patient 's daughter called to report increasing edema in patient's legs and feet and worsening symptoms of urinary incontinence. Patient's daughter is not with patient at this time. Patient is scheduled today for a palliative consult. Patient's daughter reports patient started back on blood pressure medication and bilateral leg continue to swell. Reports swelling in hands and face and patient c/o constant flow of urine . Hx of incontinence but constant flow of urine is new. Patient's daughter reports she is having more difficulty walking and wants to go to ED. Denies chest pain or difficulty breathing or fever. Earliest appt is tomorrow. Patient's daughter would like to know if PCP recommends patient to go to ED. Reviewed with daughter if patient is having increased pain and constant flow of urine she can take patient to ED if patient wants to go , to assess for urinary retention. Patient's daughter reports she will get update from patient's visit with palliative care nurse and decide. Please advise if patient can be seen in office today or go to ED. Care advise given. Patient 's daughter verbalized understanding of care advise and to call back or go to ED as patient requested for worsening symptoms.  Reason for Disposition . [1] MODERATE leg swelling (e.g., swelling extends up to knees) AND [2] new-onset or worsening  Answer Assessment - Initial Assessment Questions 1. ONSET: "When did the swelling start?" (e.g., minutes, hours, days)     Last week but worse this past Friday . 2. LOCATION: "What part of the leg is swollen?"  "Are both legs swollen or just one leg?"     Feet, ankles, legs, hands and face 3. SEVERITY: "How bad is the swelling?" (e.g., localized; mild, moderate, severe)  - Localized - small area of swelling localized to one leg  - MILD pedal edema - swelling limited to foot and ankle, pitting edema < 1/4 inch (6 mm) deep, rest and elevation eliminate most or all swelling  - MODERATE edema  - swelling of lower leg to knee, pitting edema > 1/4 inch (6 mm) deep, rest and elevation only partially reduce swelling  - SEVERE edema - swelling extends above knee, facial or hand swelling present      worse 4. REDNESS: "Does the swelling look red or infected?"     Na  5. PAIN: "Is the swelling painful to touch?" If Yes, ask: "How painful is it?"   (Scale 1-10; mild, moderate or severe)     na 6. FEVER: "Do you have a fever?" If Yes, ask: "What is it, how was it measured, and when did it start?"      denies 7. CAUSE: "What do you think is causing the leg swelling?"     Not sure, started back on B/P medication and legs continue to swell 8. MEDICAL HISTORY: "Do you have a history of heart failure, kidney disease, liver failure, or cancer?"     Multiple hx . pallative consult today  9. RECURRENT SYMPTOM: "Have you had leg swelling before?" If Yes, ask: "When was the last time?" "What happened that time?"     Yes . Few weeks ago  10. OTHER SYMPTOMS: "Do you have any other symptoms?" (e.g., chest pain, difficulty breathing)       Denies . Swelling noted in face and hands 11. PREGNANCY: "Is there any chance you are pregnant?" "When was your last menstrual period?"       na  Protocols used: LEG SWELLING AND EDEMA-A-AH

## 2021-02-03 NOTE — Progress Notes (Signed)
[facility] Palliative Care Consult Note Telephone: [phone]  Fax: [phone]    Date of encounter: 02/03/21 PATIENT NAME: Chelsea [name] [place] [id number]   [phone] (home)  DOB: December 31, 1945 MRN: [id number] PRIMARY CARE PROVIDER:    Jearld Fenton, NP,  [place] [address] [phone]  REFERRING PROVIDER:   Verl Bangs, FNP No address on file N/A  RESPONSIBLE PARTY:    Contact Information    Name Relation Home Work Mobile   [name] Daughter [phone]  [phone]       I met face to face with patient home. Palliative Care was asked to follow this patient by consultation request of  Malfi, Lupita Raider, FNP to address advance care planning and complex medical decision making. This is the initial visit.   Spoke with daughter via phone prior to and also briefly during visit                                    ASSESSMENT AND PLAN / RECOMMENDATIONS:   Advance Care Planning/Goals of Care: Goals include to maximize quality of life and symptom management. Did not get to have ACP conversation due to acute nature of visit.  Is listed as full code in EPIC  CODE STATUS:  Symptom Management/Plan:  Edema: Patient with no medical history of CHF, DVTs, or CKD.  Unsure what is causing the edema  Incontinence/excessive urination: patient treated for possible UTI about 3 weeks ago when the excessive urination and incontinence started. Symptoms have worsened since. Recommend going to ER for evaluation and patient agreed.  Patient used her life alert button to get help.  EMS was called and they called manager of the [place], who came up to check on patient and direct EMS.    Follow up Palliative Care Visit: Palliative care will continue to follow for complex medical decision making, advance care planning, and clarification of goals. Will call to set up follow up appointment after return from  ER/hospital stay.  I spent 80 minutes providing this consultation. More than 50% of the time in this consultation was spent in counseling and care coordination.  PPS: 50%  HOSPICE ELIGIBILITY/DIAGNOSIS: TBD  Chief Complaint: initial palliative visit  HISTORY OF PRESENT ILLNESS:  Chelsea [name] is a 75 y.o. year old female  with . HTN, HLD, OA, anxiety, depression, seizure, spinal stenosis, urinary incontinence. 1/17-1/20/22 patient hospitalized for rhabdomyolysis secondary to fall. 1/24-11/05/20 she was hospitalized due to multiple falls. Imaging showed multilevel severe cervical spinal and foraminal stenosis.  She was recommended to have surgery and she declined.  She could not go to SNF due to insurance issues at this time.  2/3-11/10/20 patient had another fall with hospitalization.  Xrays of chest, left eblow, left ankle, and left knee and CT of head and cervical spine showed no acute findings.  Patient did have short term rehab after this.   Patient has been having excessive urination and incontinence for about 3 weeks.  She was treated with antibiotic for possible UTI.  Since her symptoms have worsened.  She states urinating so much that she soaks through 2 overnight pull ups worn together within an hour.  Denies fever, hematuria, dysuria.  She has started having edema in bilateral legs over the past week.  States that the swelling was worse over the weekend  and is down a some today but is having tightness and pain associated with the edema in her left leg.  She denies SOB, cough, chest pain, palpitations.  Patient does have severe spinal stenosis and has chronic back pain due to this.  She also states that she gets numbness in her right arm and leg due to the stenosis.  She uses walker to ambulate but is having more difficulty standing.  She does state that she has increased weakness for 3 days.    History obtained from review of EMR and interview with family and Ms. Markwood.  I reviewed  available labs, medications, imaging, studies and related documents from the EMR.  Records reviewed and summarized above.    Physical Exam:  Constitutional: NAD General: WNWD EYES: anicteric sclera, lids intact, no discharge  ENMT: intact hearing, oral mucous membranes moist CV: S1S2, RRR, pitting edema to bilateral legs almost up to knees with more on right than left Pulmonary: decreased air movement to left lung fields, no adventitious breath sounds heard, no increased work of breathing, no cough Abdomen:  normo-active BS + 4 quadrants, soft and non tender MSK: limited ROM of right arm and leg; ambulatory with rollator Skin: warm and dry, no rashes or wounds on visible skin Neuro:  A and O x 3 Hem/lymph/immuno: no widespread bruising   CURRENT PROBLEM LIST:  Patient Active Problem List   Diagnosis Date Noted  . Cervical myelopathy ([place]) 01/28/2021  . Mixed incontinence 01/28/2021  . Chronic anemia 09/26/2018  . Depression, major, recurrent, in partial remission ([place]) 01/11/2018  . Pure hypercholesterolemia 01/11/2018  . GAD (generalized anxiety disorder) 05/14/2011  . Essential hypertension 02/06/2011  . Spinal stenosis of lumbosacral region 02/06/2011   PAST MEDICAL HISTORY:  Active Ambulatory Problems    Diagnosis Date Noted  . Essential hypertension 02/06/2011  . Spinal stenosis of lumbosacral region 02/06/2011  . GAD (generalized anxiety disorder) 05/14/2011  . Chronic anemia 09/26/2018  . Depression, major, recurrent, in partial remission ([place]) 01/11/2018  . Pure hypercholesterolemia 01/11/2018  . Cervical myelopathy ([place]) 01/28/2021  . Mixed incontinence 01/28/2021   Resolved Ambulatory Problems    Diagnosis Date Noted  . Hyperlipidemia 02/06/2011  . S/P breast biopsy 02/06/2011  . Allergic conjunctivitis 06/23/2011  . Oral ulcer 06/23/2011  . Neck pain 04/11/2012  . Physical exam, annual 05/16/2012  . Sinusitis 08/26/2012  . Bronchitis with obstruction  ([place]) 08/26/2012  . Arthritis of big toe 10/19/2012  . BMI 40.0-44.9, adult ([place]) 03/15/2018  . Anxiety state 01/11/2018  . History of gastritis 05/03/2018  . History of Helicobacter pylori infection 05/03/2018  . History of scoliosis 01/11/2018  . Hx of adenomatous colonic polyps 05/03/2018  . Postmenopausal 03/15/2018  . Right arm weakness 07/09/2020  . Right leg weakness 07/09/2020  . Urinary tract infection without hematuria 07/09/2020  . Abnormal urinalysis 07/09/2020  . Memory difficulties 07/09/2020  . Rhabdomyolysis 10/22/2020  . Age-related physical debility 10/28/2020  . Weakness 10/29/2020  . Falls frequently 12/17/2020   Past Medical History:  Diagnosis Date  . Anxiety   . Arthritis   . Depression   . Hypertension   . Seizures ([place])   . Spinal stenosis   . Ulcer   . Urinary incontinence    SOCIAL HX:  Social History   Tobacco Use  . Smoking status: Never Smoker  . Smokeless tobacco: Never Used  Substance Use Topics  . Alcohol use: Never   FAMILY HX:  Family History  Problem Relation  Age of Onset  . Arthritis Mother   . Heart disease Mother   . Stroke Mother   . Hypertension Mother   . COPD Mother   . Sudden death Sister   . Other Father        unknown medical history  . Breast cancer Neg Hx       ALLERGIES:  Allergies  Allergen Reactions  . Penicillins     "Everything turned black"     PERTINENT MEDICATIONS:  Outpatient Encounter Medications as of 02/03/2021  Medication Sig  . acetaminophen (TYLENOL) 500 MG tablet Take 500 mg by mouth every 6 (six) hours as needed.  . ALPRAZolam (XANAX) 0.5 MG tablet Take 1 tablet (0.5 mg total) by mouth 2 (two) times daily as needed for anxiety.  Marland Kitchen aspirin 325 MG tablet Take 325 mg by mouth daily.  Marland Kitchen atorvastatin (LIPITOR) 40 MG tablet Take 1 tablet (40 mg total) by mouth daily.  . Cyanocobalamin (VITAMIN B 12 PO) Take 500 mcg by mouth daily.  . diclofenac Sodium (VOLTAREN) 1 % GEL Apply 4 g topically 4  (four) times daily.  Marland Kitchen FLUoxetine (PROZAC) 20 MG capsule Take 60 mg by mouth every morning.  . gabapentin (NEURONTIN) 100 MG capsule Take 2 capsules (200 mg total) by mouth 2 (two) times daily.  Marland Kitchen lidocaine (LIDODERM) 5 % Place 1 patch onto the skin daily. Remove & Discard patch within 12 hours or as directed by MD  . polyethylene glycol powder (GLYCOLAX/MIRALAX) 17 GM/SCOOP powder Take 17 g by mouth daily.  Marland Kitchen triamterene-hydrochlorothiazide (MAXZIDE-25) 37.5-25 MG tablet Take 1 tablet by mouth daily.  . [DISCONTINUED] simvastatin (ZOCOR) 40 MG tablet TAKE 1 TABLET BY MOUTH EVERY DAY   No facility-administered encounter medications on file as of 02/03/2021.    Thank you for the opportunity to participate in the care of Ms. Eichhorn.  The palliative care team will continue to follow. Please call our office at [phone] if we can be of additional assistance.   Amy Jenetta Downer, NP , DNP  This chart was dictated using voice recognition software. Despite best efforts to proofread, errors can occur which can change the documentation meaning.   COVID-19 PATIENT SCREENING TOOL Asked and negative response unless otherwise noted:   Have you had symptoms of covid, tested positive or been in contact with someone with symptoms/positive test in the past 5-10 days? negative

## 2021-02-03 NOTE — Progress Notes (Signed)
[facility] ED34 [facility] Hospital Liaison note: ? ?This patient is currently enrolled in [facility] outpatient-based Palliative Care. Will continue to follow for disposition. ? ?Please call with any outpatient palliative questions or concerns. ? ?Thank you, ?Dee Curry, LPN ?[facility] Hospital Liaison ?[phone] ?

## 2021-02-03 NOTE — ED Provider Notes (Signed)
[facility] Emergency Department Provider Note   ____________________________________________    I have reviewed the triage vital signs and the nursing notes.   HISTORY  Chief Complaint Leg Swelling     HPI Chelsea [name] is a 75 y.o. female with history as noted below who presents with complaints of lower extremity swelling.  Patient reports long history of swelling in her lower extremities, however the last several days she she is concerned because she feels a knot in her left anterior lower leg, no calf swelling or tenderness.  Warm and well-perfused distally.  No shortness of breath.  Past Medical History:  Diagnosis Date  . Anxiety   . Arthritis   . Depression   . Hyperlipidemia   . Hypertension   . Seizures ([place])   . Spinal stenosis   . Ulcer   . Urinary incontinence     Patient Active Problem List   Diagnosis Date Noted  . Cervical myelopathy ([place]) 01/28/2021  . Mixed incontinence 01/28/2021  . Chronic anemia 09/26/2018  . Depression, major, recurrent, in partial remission ([place]) 01/11/2018  . Pure hypercholesterolemia 01/11/2018  . GAD (generalized anxiety disorder) 05/14/2011  . Essential hypertension 02/06/2011  . Spinal stenosis of lumbosacral region 02/06/2011    Past Surgical History:  Procedure Laterality Date  . ABDOMINAL HYSTERECTOMY    . BACK SURGERY     due to polio  . BREAST BIOPSY Bilateral    neg  . BREAST BIOPSY Right 2011   neg/stereo  . CARPAL TUNNEL RELEASE    . CATARACT EXTRACTION Right 2020    Prior to Admission medications   Medication Sig Start Date End Date Taking? Authorizing Provider  acetaminophen (TYLENOL) 500 MG tablet Take 500 mg by mouth every 6 (six) hours as needed.    [provider]  ALPRAZolam Duanne Moron) 0.5 MG tablet Take 1 tablet (0.5 mg total) by mouth 2 (two) times daily as needed for anxiety. 01/03/21   Karamalegos, Devonne Doughty, DO  aspirin 325 MG tablet Take 325 mg by  mouth daily.    [provider]  atorvastatin (LIPITOR) 40 MG tablet Take 1 tablet (40 mg total) by mouth daily. 02/28/20   Malfi, Lupita Raider, FNP  Cyanocobalamin (VITAMIN B 12 PO) Take 500 mcg by mouth daily.    [provider]  diclofenac Sodium (VOLTAREN) 1 % GEL Apply 4 g topically 4 (four) times daily. 12/17/20   Kathrine Haddock, NP  FLUoxetine (PROZAC) 20 MG capsule Take 60 mg by mouth every morning. 01/06/21   [provider]  gabapentin (NEURONTIN) 100 MG capsule Take 2 capsules (200 mg total) by mouth 2 (two) times daily. 01/03/21   Karamalegos, Devonne Doughty, DO  lidocaine (LIDODERM) 5 % Place 1 patch onto the skin daily. Remove & Discard patch within 12 hours or as directed by MD 10/24/20   Max Sane, MD  polyethylene glycol powder (GLYCOLAX/MIRALAX) 17 GM/SCOOP powder Take 17 g by mouth daily. 01/28/21   Jearld Fenton, NP  triamterene-hydrochlorothiazide (MAXZIDE-25) 37.5-25 MG tablet Take 1 tablet by mouth daily. 01/03/21   Karamalegos, Devonne Doughty, DO  simvastatin (ZOCOR) 40 MG tablet TAKE 1 TABLET BY MOUTH EVERY DAY 10/30/19 02/28/20  Olin Hauser, DO     Allergies Penicillins  Family History  Problem Relation Age of Onset  . Arthritis Mother   . Heart disease Mother   . Stroke Mother   . Hypertension Mother   . COPD Mother   .  Sudden death Sister   . Other Father        unknown medical history  . Breast cancer Neg Hx     Social History Social History   Tobacco Use  . Smoking status: Never Smoker  . Smokeless tobacco: Never Used  Vaping Use  . Vaping Use: Never used  Substance Use Topics  . Alcohol use: Never  . Drug use: No    Review of Systems  Constitutional: No fever/chills Eyes: No visual changes.  ENT: No sore throat. Cardiovascular: Denies chest pain. Respiratory: Denies shortness of breath. Gastrointestinal: No abdominal pain.  Genitourinary: Negative for dysuria. Musculoskeletal: As Skin: Negative for  rash. Neurological: Negative for headache   ____________________________________________   PHYSICAL EXAM:  VITAL SIGNS: ED Triage Vitals  Enc Vitals Group     BP 02/03/21 1452 (!) 97/42     Pulse Rate 02/03/21 1452 71     Resp 02/03/21 1452 18     Temp 02/03/21 1452 98.1 F (36.7 C)     Temp Source 02/03/21 1452 Oral     SpO2 02/03/21 1452 100 %     Weight 02/03/21 1504 84.5 kg (186 lb 4.6 oz)     Height --      Head Circumference --      Peak Flow --      Pain Score 02/03/21 1453 6     Pain Loc --      Pain Edu? --      Excl. in [place]? --     Constitutional: Alert and oriented.   Nose: No congestion/rhinnorhea. Mouth/Throat: Mucous membranes are moist.    Cardiovascular: Normal rate, regular rhythm.  Good peripheral circulation. Respiratory: Normal respiratory effort.  No retractions. Gastrointestinal: Soft and nontender. No distention.   Musculoskeletal: Minimal lower extremity edema bilaterally, small area of tenderness left anterior leg, no calf tenderness or swelling.  Warm and well-perfused distally Neurologic:  Normal speech and language. No gross focal neurologic deficits are appreciated.  Skin:  Skin is warm, dry and intact. Psychiatric: Mood and affect are normal. Speech and behavior are normal.  ____________________________________________   LABS (all labs ordered are listed, but only abnormal results are displayed)  Labs Reviewed  BASIC METABOLIC PANEL - Abnormal; Notable for the following components:      Result Value   Glucose, Bld 102 (*)    All other components within normal limits  CBC - Abnormal; Notable for the following components:   Hemoglobin 11.7 (*)    All other components within normal limits   ____________________________________________  EKG  ED ECG REPORT I, Lavonia Drafts, the attending physician, personally viewed and interpreted this ECG.  Date: 02/03/2021  Rhythm: normal sinus rhythm QRS Axis: normal Intervals:  normal ST/T Wave abnormalities: normal Narrative Interpretation: no evidence of acute ischemia  ____________________________________________  RADIOLOGY  Chest x-ray reviewed by me ____________________________________________   PROCEDURES  Procedure(s) performed: No  Procedures   Critical Care performed: No ____________________________________________   INITIAL IMPRESSION / ASSESSMENT AND PLAN / ED COURSE  Pertinent labs & imaging results that were available during my care of the patient were reviewed by me and considered in my medical decision making (see chart for details).  Patient well-appearing and in no acute distress, exam is not consistent with DVT, very minimal peripheral edema, small area of tenderness on the anterior leg likely muscle inflammation  Lab work is quite reassuring, normal white blood cell count, BUN/creatinine are normal.  Chest x-ray is unremarkable  Recommend  supportive care, outpatient follow-up with PCP    ____________________________________________   FINAL CLINICAL IMPRESSION(S) / ED DIAGNOSES  Final diagnoses:  Peripheral edema        Note:  This document was prepared using Dragon voice recognition software and may include unintentional dictation errors.   Lavonia Drafts, MD 02/03/21 2133

## 2021-02-03 NOTE — ED Triage Notes (Signed)
Says she had recent hospitalization and tehn rehab.  Got out 2 weeks ago.  Now she says increased swelling in lower extremities and worse on left.  Says she continually urinates for several days.

## 2021-02-03 NOTE — ED Notes (Signed)
Pt comes via EMS from independent living with c/o left leg pain and swelling. Pt has bilateral edema per EMS. VSS

## 2021-02-04 NOTE — Telephone Encounter (Signed)
No appt in the office today. I see she did not go to the ED. Does she want to schedule an appt for later in the week?

## 2021-02-04 NOTE — Telephone Encounter (Signed)
The pt was seen in the ER on yesterday and diagnose with Peripheral edema. She was advised to schedule a f/u appt with her PCP. Appt scheduled for this Friday, May 6th.

## 2021-02-04 NOTE — Telephone Encounter (Signed)
noted 

## 2021-02-05 ENCOUNTER — Ambulatory Visit: Payer: Self-pay | Admitting: *Deleted

## 2021-02-05 NOTE — Telephone Encounter (Signed)
PT- [facility] PT is calling to report that the patient states she fell twice last week- 4/22 and 4/23. Patient stated she fell out of bed on one occasion and in the bathroom on the other. No injury reported. Attempted to call patient- VM picked up- but is full and could not leave message.

## 2021-02-06 ENCOUNTER — Ambulatory Visit: Payer: Self-pay | Admitting: *Deleted

## 2021-02-06 NOTE — Telephone Encounter (Signed)
Home Health Verbal Orders -Stephanie/ [facility]  Callback Number:[id number] Requesting PT/Eval/ treat/ for fall

## 2021-02-06 NOTE — Telephone Encounter (Addendum)
Pt called stating her feet are swollen, and she was recently released from hospital; she was told to see her PCP ASAP; she says her symptoms are not worsening; the pt was seen in the ED on 02/03/21; the pt denies shortness of breath, and chest pain; she says that she is unable to walk and drive and she has to have transport to the office; the pt was previously scheduled to be seen on 02/07/21 with Dr Parks Ranger, [place]; confirmed appt with pt; pt also advised to seek evaluation in the ED if her symptoms worsen or if she develops new symptoms; the pt can be contacted [phone]; she would like a call back from the office for a sooner appt;will route to the office for notification.  Reason for Disposition . Requesting regular office appointment  Answer Assessment - Initial Assessment Questions 1. REASON FOR CALL or QUESTION: "What is your reason for calling today?" or "How can I best help you?" or "What question do you have that I can help answer?"     Pt was seen in ED and told to see her PCP ASAP; she would like to be seen sooner than her scheduled appt on 02/07/21  Protocols used: [place]

## 2021-02-07 ENCOUNTER — Other Ambulatory Visit: Payer: Self-pay

## 2021-02-07 ENCOUNTER — Ambulatory Visit: Payer: Self-pay | Admitting: Internal Medicine

## 2021-02-07 ENCOUNTER — Ambulatory Visit (INDEPENDENT_AMBULATORY_CARE_PROVIDER_SITE_OTHER): Payer: Medicare Other | Admitting: Family Medicine

## 2021-02-07 ENCOUNTER — Encounter: Payer: Self-pay | Admitting: Family Medicine

## 2021-02-07 VITALS — BP 125/53 | HR 63 | Ht 59.0 in | Wt 186.0 lb

## 2021-02-07 DIAGNOSIS — I872 Venous insufficiency (chronic) (peripheral): Secondary | ICD-10-CM

## 2021-02-07 DIAGNOSIS — N3946 Mixed incontinence: Secondary | ICD-10-CM

## 2021-02-07 NOTE — Patient Instructions (Addendum)
Thank you for coming to the office today.  Keep upcoming ECHOCardiogram as scheduled.  Keep on current BP medication for fluid swelling  Use RICE therapy: - R - Rest / relative rest with activity modification avoid overuse of joint - I - Ice packs (make sure you use a towel or sock / something to protect skin) - C - Compression with ACE wrap to apply pressure and reduce swelling allowing more support - E - Elevation - if significant swelling, lift leg above heart level (toes above your nose) to help reduce swelling, most helpful at night after day of being on your feet  Referral to Urology  [facility] -[address] floor [place],  [place]  [address] Phone: [phone]  Please schedule a Follow-up Appointment to: Return if symptoms worsen or fail to improve.  If you have any other questions or concerns, please feel free to call the office or send a message through [place]. You may also schedule an earlier appointment if necessary.  Additionally, you may be receiving a survey about your experience at our office within a few days to 1 week by e-mail or mail. We value your feedback.  Nobie Putnam, DO [place]

## 2021-02-07 NOTE — Progress Notes (Signed)
Subjective:    Patient ID: Chelsea [name], female    DOB: 1946-01-19, 75 y.o.   MRN: [id number]  Chelsea [name] is a 75 y.o. female presenting on 02/07/2021 for Edema  PCP Webb Silversmith, FNP   HPI   Here with home aide.  ED FOLLOW-UP VISIT  Hospital/Location: [facility] Date of ED Visit: 02/03/21  Reason for Presenting to ED: Leg Swelling Edema Primary (+Secondary) Diagnosis: Peripheral Edema, Lower Ext  FOLLOW-UP  - ED provider note and record have been reviewed - Patient presents today about 4 days after recent ED visit. Brief summary of recent course, patient had symptoms of increased leg swelling, presented to ED on 02/03/21, testing in ED with labs and EKG CXR, discharged to home.  - Today reports overall has done fairly well after discharge from ED. Symptoms of edema in lower ext has improved, but not resolved  Additional updates She is followed by [facility] home visits, last seen 02/03/21 also has [place] with OT. She is mostly wheelchair bound, limited mobility, she continues to do lower ext exercises and has ability to elevated in evenings.  Last seen by PCP on 4/26 for same problem. She was continued on Triamteren-HCTZ and an ECHO was ordered to investigate, it is scheduled for 03/07/21. Patient wants to have it done sooner.  Additional problem Urinary frequency, bladder discomfort vs spasm - she reports history of UTI in past, mixed symptoms question with incontinence now and leakage and frequency. She has not seen urologist. She is going through several pads.  I have reviewed the discharge medication list, and have reconciled the current and discharge medications today.    Depression screen [facility] 2/9 02/07/2021 08/13/2020 11/23/2019  Decreased Interest 0 0 2  Down, Depressed, Hopeless 3 0 1  PHQ - 2 Score 3 0 3  Altered sleeping 1 0 1  Tired, decreased energy 3 0 0  Change in appetite 2 0 0  Feeling bad or failure about yourself  1 0 0  Trouble  concentrating 3 0 2  Moving slowly or fidgety/restless 3 0 2  Suicidal thoughts 0 0 0  PHQ-9 Score 16 0 8  Difficult doing work/chores Not difficult at all Not difficult at all Not difficult at all    Social History   Tobacco Use  . Smoking status: Never Smoker  . Smokeless tobacco: Never Used  Vaping Use  . Vaping Use: Never used  Substance Use Topics  . Alcohol use: Never  . Drug use: No    Review of Systems Per HPI unless specifically indicated above     Objective:    BP (!) 125/53   Pulse 63   Ht 4\' 11"  (1.499 m)   Wt 186 lb (84.4 kg)   SpO2 100%   BMI 37.57 kg/m   Wt Readings from Last 3 Encounters:  02/07/21 186 lb (84.4 kg)  02/03/21 186 lb 4.6 oz (84.5 kg)  01/28/21 188 lb (85.3 kg)    Physical Exam Vitals and nursing note reviewed.  Constitutional:      General: She is not in acute distress.    Appearance: She is well-developed. She is not diaphoretic.     Comments: Well-appearing, comfortable, cooperative  HENT:     Head: Normocephalic and atraumatic.  Eyes:     General:        Right eye: No discharge.        Left eye: No discharge.     Conjunctiva/sclera: Conjunctivae normal.  Cardiovascular:     Rate and Rhythm: Normal rate.  Pulmonary:     Effort: Pulmonary effort is normal.  Musculoskeletal:     Right lower leg: Edema present.     Left lower leg: Edema present.  Skin:    General: Skin is warm and dry.     Findings: No erythema or rash.  Neurological:     Mental Status: She is alert and oriented to person, place, and time.  Psychiatric:        Behavior: Behavior normal.     Comments: Well groomed, good eye contact, normal speech and thoughts       Results for orders placed or performed during the hospital encounter of 16/10/96  Basic metabolic panel  Result Value Ref Range   Sodium 140 135 - 145 mmol/L   Potassium 3.5 3.5 - 5.1 mmol/L   Chloride 102 98 - 111 mmol/L   CO2 30 22 - 32 mmol/L   Glucose, Bld 102 (H) 70 - 99 mg/dL    BUN 14 8 - 23 mg/dL   Creatinine, Ser 0.74 0.44 - 1.00 mg/dL   Calcium 9.4 8.9 - 10.3 mg/dL   GFR, Estimated >60 >60 mL/min   Anion gap 8 5 - 15  CBC  Result Value Ref Range   WBC 5.0 4.0 - 10.5 K/uL   RBC 3.95 3.87 - 5.11 MIL/uL   Hemoglobin 11.7 (L) 12.0 - 15.0 g/dL   HCT 36.6 36.0 - 46.0 %   MCV 92.7 80.0 - 100.0 fL   MCH 29.6 26.0 - 34.0 pg   MCHC 32.0 30.0 - 36.0 g/dL   RDW 14.2 11.5 - 15.5 %   Platelets 210 150 - 400 K/uL   nRBC 0.0 0.0 - 0.2 %   I have personally reviewed the radiology report from CXR 02/03/21.  DG Chest 2 ViewPerformed 02/03/2021 Final result  Study Result CLINICAL DATA: Leg swelling  EXAM: CHEST - 2 VIEW  COMPARISON: 11/07/2020  FINDINGS: Linear scarring or atelectasis in the right upper lobe. No consolidation or pneumothorax. Mild cardiomegaly with suspected trace right pleural effusion. No overt pulmonary edema. Nodular opacity projecting over the mid to lower spine on lateral view.  IMPRESSION: No active cardiopulmonary disease.  Nodular opacity projecting over the spine on lateral view. Consider CT for further evaluation.   Electronically Signed By: Donavan Foil M.D. On: 02/03/2021 15:47        Assessment & Plan:   Problem List Items Addressed This Visit   None   Visit Diagnoses    Chronic venous insufficiency    -  Primary   Urinary incontinence, mixed       Relevant Orders   Ambulatory referral to Urology      Chronic Venous Insufficiency LE Edema ED visit reviewed Some improved today Stable chronic problem, already being evaluated by PCP on thiazide diuretic now, history and exam most consistent with venous insufficiency without acute cause of swelling or more concerning problem. - She is very sedentary, wheelchair bound, obese and these factors contribute as well - Continue current thiazide - Await ECHO 6/3 - PCP reconsidering furosemide as PRN diuretic in future as well No other changes today, reassurance  given.  Urinary incontinence, mixed Persistent problem with incontinence symptoms, difficult to characterize, can have component of stress and urge incontinence by history. She has neurological history may have possible neurogenic bladder Will Refer to Urology [facility] for consultation before adding any other medications that may cause side effect or  impact her.   Orders Placed This Encounter  Procedures  . Ambulatory referral to Urology    Referral Priority:   Routine    Referral Type:   Consultation    Referral Reason:   Specialty Services Required    Requested Specialty:   Urology    Number of Visits Requested:   1     No orders of the defined types were placed in this encounter.   Follow up plan: Return if symptoms worsen or fail to improve.  Nobie Putnam, [facility] 02/07/2021, 1:31 PM

## 2021-02-10 ENCOUNTER — Telehealth: Payer: Self-pay | Admitting: Internal Medicine

## 2021-02-10 NOTE — Telephone Encounter (Signed)
Chelsea [name] home health is calling in to request VO for PT evaluation and also social worker orders to work with pt for potential placement for higher care.     Pt had a fall twice last week.    CB: [phone]- secure vm.

## 2021-02-11 ENCOUNTER — Telehealth: Payer: Self-pay | Admitting: Internal Medicine

## 2021-02-11 ENCOUNTER — Ambulatory Visit: Payer: Self-pay | Admitting: *Deleted

## 2021-02-11 NOTE — Telephone Encounter (Signed)
I left a message on Stephanie's VM letting her know It is OK to proceed with orders

## 2021-02-11 NOTE — Telephone Encounter (Signed)
Terrace Arabia, OT Assistant, called stating the pt fell out of bed at 0330 on 02/11/21; she called EMS; she refused transfer to ED; she is having pain in her neck rated 7 out of 10 and constant; she has ongoing pain in her arm rated 7 out of 10; recommendations made per nurse triage protocol; the pt refused going to ED; the pt does not want an appt; Melissa states she is calling per protocol to due to pt's fall and pain level >6; Melissa says there is no family present, and the pt is requesting she hang up the phone; the pt is seen by Webb Silversmith at [facility]; will route to office for notification.  Reason for Disposition . Injury (or injuries) that need emergency care  Answer Assessment - Initial Assessment Questions 1. MECHANISM: "How did the fall happen?"     Pt fell out of bed when she was reaching for something on the floor 2. DOMESTIC VIOLENCE AND ELDER ABUSE SCREENING: "Did you fall because someone pushed you or tried to hurt you?" If Yes, ask: "Are you safe now?"    n/a 3. ONSET: "When did the fall happen?" (e.g., minutes, hours, or days ago)     02/11/21 0330 4. LOCATION: "What part of the body hit the ground?" (e.g., back, buttocks, head, hips, knees, hands, head, stomach)     Landed on the top of her head 5. INJURY: "Did you hurt (injure) yourself when you fell?" If Yes, ask: "What did you injure? Tell me more about this?" (e.g., body area; type of injury; pain severity)"    Neck and right shoulder 6. PAIN: "Is there any pain?" If Yes, ask: "How bad is the pain?" (e.g., Scale 1-10; or mild,  moderate, severe)   - NONE (0): No pain   - MILD (1-3): Doesn't interfere with normal activities    - MODERATE (4-7): Interferes with normal activities or awakens from sleep    - SEVERE (8-10): Excruciating pain, unable to do any normal activities      7 out of 10 7. SIZE: For cuts, bruises, or swelling, ask: "How large is it?" (e.g., inches or centimeters)    Pt will not let the OT  visualize the area 8. PREGNANCY: "Is there any chance you are pregnant?" "When was your last menstrual period?"    no 9. OTHER SYMPTOMS: "Do you have any other symptoms?" (e.g., dizziness, fever, weakness; new onset or worsening).     Neck swelling; ongoing arm pain 10. CAUSE: "What do you think caused the fall (or falling)?" (e.g., tripped, dizzy spell)  Protocols used: FALLS AND FALLING-A-AH

## 2021-02-11 NOTE — Telephone Encounter (Signed)
OK to continue PT/OT as long as not having headaches, dizziness, visual changes or vomiting

## 2021-02-11 NOTE — Telephone Encounter (Signed)
Ok to give verbal orders  Chelsea [name], [facility] 02/11/2021, 9:10 AM

## 2021-02-11 NOTE — Telephone Encounter (Signed)
Apolonio Schneiders, [employer], notified.

## 2021-02-11 NOTE — Telephone Encounter (Signed)
Melissa Hylton calling from [place] contact: [phone] OT: Pt had a fall about 3:30 am this morning, she lander on her head. She is reporting neck pain, and also arm/shoulder pain. (right pain flare up 8 out of 10)   Warm transferred to triage

## 2021-02-12 ENCOUNTER — Emergency Department
Admission: EM | Admit: 2021-02-12 | Discharge: 2021-02-17 | Disposition: A | Discharge status: Home / Self Care | Payer: Medicare Other | Attending: Emergency Medicine | Admitting: Emergency Medicine

## 2021-02-12 ENCOUNTER — Other Ambulatory Visit: Payer: Self-pay

## 2021-02-12 ENCOUNTER — Emergency Department: Payer: Medicare Other

## 2021-02-12 DIAGNOSIS — Z79899 Other long term (current) drug therapy: Secondary | ICD-10-CM | POA: Insufficient documentation

## 2021-02-12 DIAGNOSIS — W19XXXA Unspecified fall, initial encounter: Secondary | ICD-10-CM | POA: Insufficient documentation

## 2021-02-12 DIAGNOSIS — R2681 Unsteadiness on feet: Secondary | ICD-10-CM | POA: Diagnosis not present

## 2021-02-12 DIAGNOSIS — I10 Essential (primary) hypertension: Secondary | ICD-10-CM | POA: Diagnosis not present

## 2021-02-12 DIAGNOSIS — R5381 Other malaise: Secondary | ICD-10-CM

## 2021-02-12 DIAGNOSIS — Y92009 Unspecified place in unspecified non-institutional (private) residence as the place of occurrence of the external cause: Secondary | ICD-10-CM | POA: Insufficient documentation

## 2021-02-12 DIAGNOSIS — Z7982 Long term (current) use of aspirin: Secondary | ICD-10-CM | POA: Diagnosis not present

## 2021-02-12 LAB — COMPREHENSIVE METABOLIC PANEL
ALT: 17 U/L (ref 0–44)
AST: 21 U/L (ref 15–41)
Albumin: 3.7 g/dL (ref 3.5–5.0)
Alkaline Phosphatase: 80 U/L (ref 38–126)
Anion gap: 7 (ref 5–15)
BUN: 19 mg/dL (ref 8–23)
CO2: 33 mmol/L — ABNORMAL HIGH (ref 22–32)
Calcium: 9.6 mg/dL (ref 8.9–10.3)
Chloride: 99 mmol/L (ref 98–111)
Creatinine, Ser: 0.93 mg/dL (ref 0.44–1.00)
GFR, Estimated: >60 mL/min (ref >60)
Glucose, Bld: 104 mg/dL — ABNORMAL HIGH (ref 70–99)
Potassium: 3.9 mmol/L (ref 3.5–5.1)
Sodium: 139 mmol/L (ref 135–145)
Total Bilirubin: 0.5 mg/dL (ref 0.3–1.2)
Total Protein: 7.5 g/dL (ref 6.5–8.1)

## 2021-02-12 LAB — CBC WITH DIFFERENTIAL/PLATELET
Abs Immature Granulocytes: 0.01 10*3/uL (ref 0.00–0.07)
Basophils Absolute: 0 10*3/uL (ref 0.0–0.1)
Basophils Relative: 0 %
Eosinophils Absolute: 0.2 10*3/uL (ref 0.0–0.5)
Eosinophils Relative: 3 %
HCT: 35.3 % — ABNORMAL LOW (ref 36.0–46.0)
Hemoglobin: 10.9 g/dL — ABNORMAL LOW (ref 12.0–15.0)
Immature Granulocytes: 0 %
Lymphocytes Relative: 51 %
Lymphs Abs: 2.5 10*3/uL (ref 0.7–4.0)
MCH: 29.5 pg (ref 26.0–34.0)
MCHC: 30.9 g/dL (ref 30.0–36.0)
MCV: 95.7 fL (ref 80.0–100.0)
Monocytes Absolute: 0.6 10*3/uL (ref 0.1–1.0)
Monocytes Relative: 12 %
Neutro Abs: 1.7 10*3/uL (ref 1.7–7.7)
Neutrophils Relative %: 34 %
Platelets: 222 10*3/uL (ref 150–400)
RBC: 3.69 MIL/uL — ABNORMAL LOW (ref 3.87–5.11)
RDW: 14.4 % (ref 11.5–15.5)
WBC: 5.1 10*3/uL (ref 4.0–10.5)
nRBC: 0 % (ref 0.0–0.2)

## 2021-02-12 LAB — URINALYSIS, COMPLETE (UACMP) WITH MICROSCOPIC
Bacteria, UA: NONE SEEN
Bilirubin Urine: NEGATIVE
Glucose, UA: NEGATIVE mg/dL
Hgb urine dipstick: NEGATIVE
Ketones, ur: NEGATIVE mg/dL
Leukocytes,Ua: NEGATIVE
Nitrite: NEGATIVE
Protein, ur: NEGATIVE mg/dL
Specific Gravity, Urine: 1.011 (ref 1.005–1.030)
pH: 7 (ref 5.0–8.0)

## 2021-02-12 LAB — CK: Total CK: 178 U/L (ref 38–234)

## 2021-02-12 IMAGING — CT CT HEAD W/O CM
3 series · 16 of 47 positions shown, 19 images · non-contrast
Comparison: CT head [DATE]

CLINICAL DATA: Status post fall

EXAM:
CT HEAD WITHOUT CONTRAST
TECHNIQUE: Contiguous axial images were obtained from the base of the skull
through the vertex without intravenous contrast.

[Series 3: head wo · axial · 0.43mm/px · z∈[-139,+1]mm · 10 of 34 slices shown, 13 images]
[im 3/34  brain]
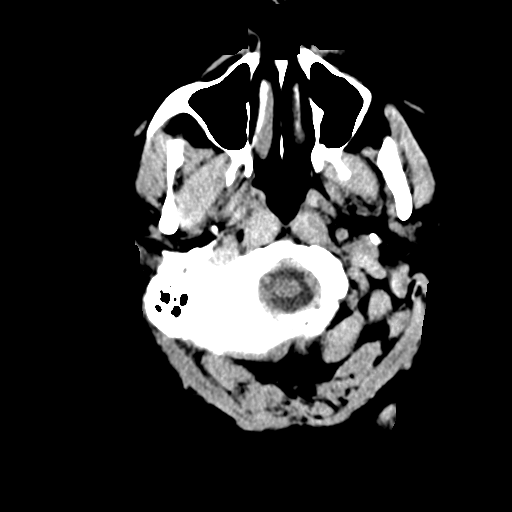
[im 3/34  bone]
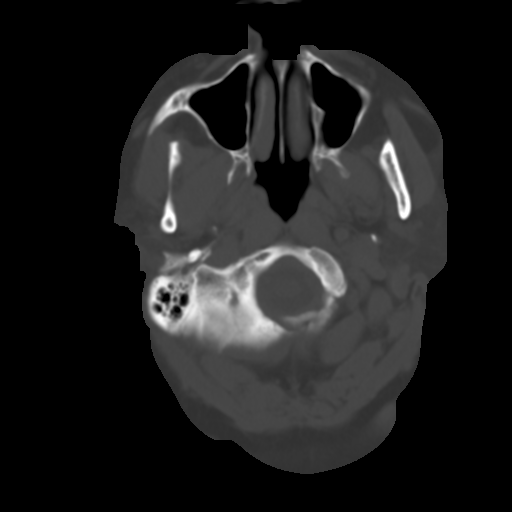
[im 6/34  brain]
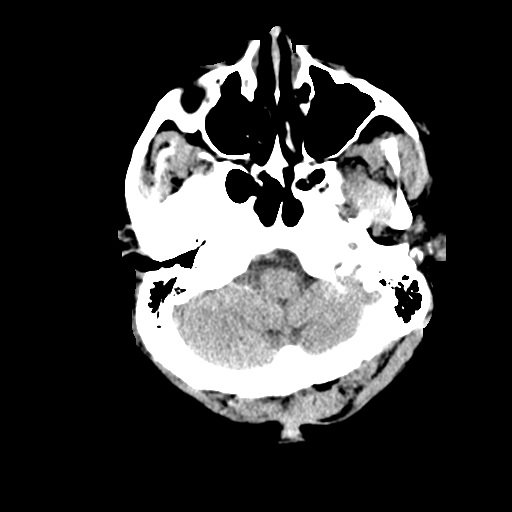
[im 10/34  brain]
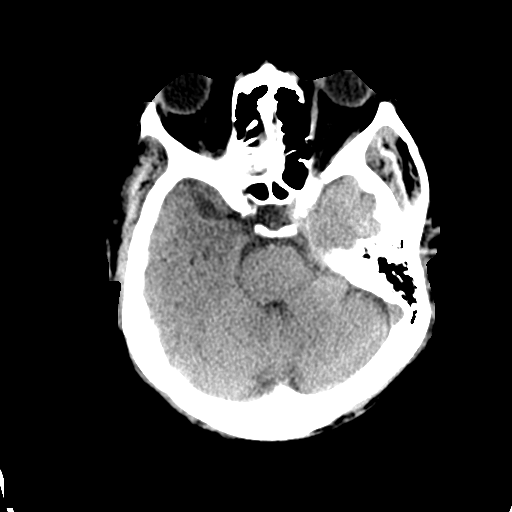
[im 12/34  brain]
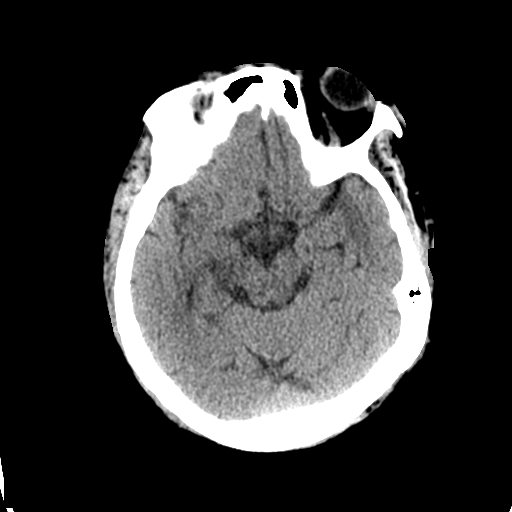
[im 15/34  brain]
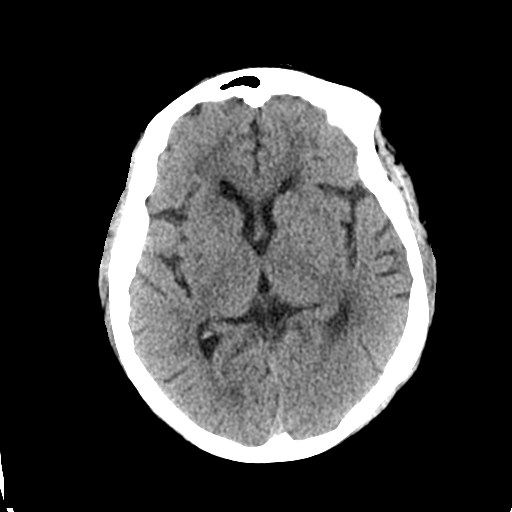
[im 15/34  bone]
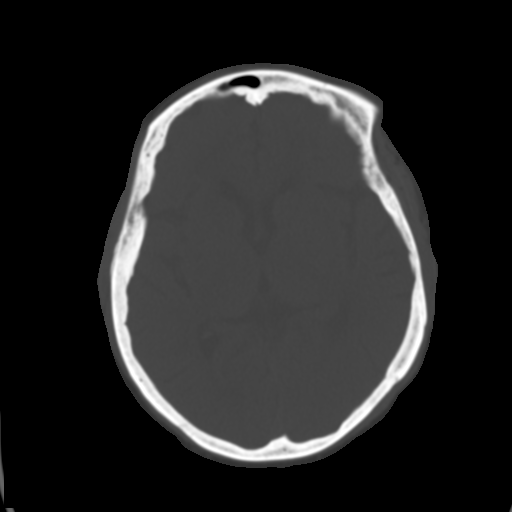
[im 19/34  brain]
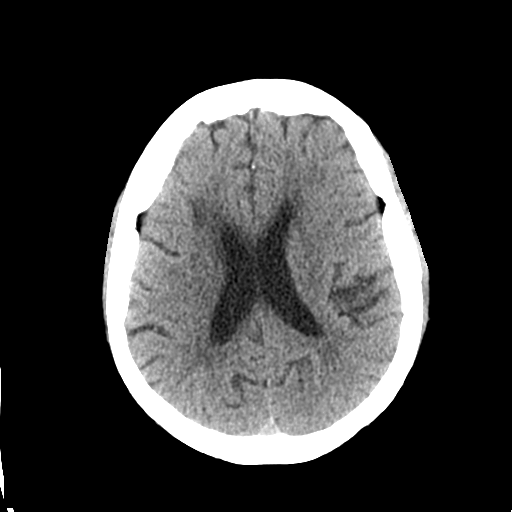
[im 22/34  brain]
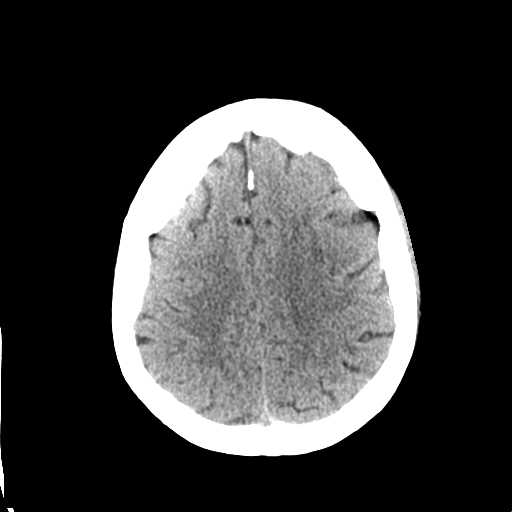
[im 26/34  brain]
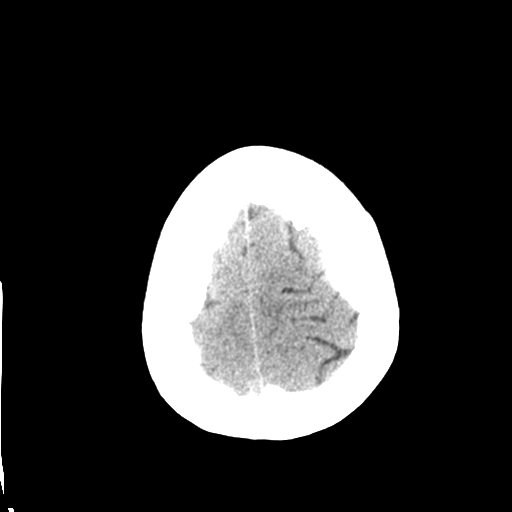
[im 28/34  brain]
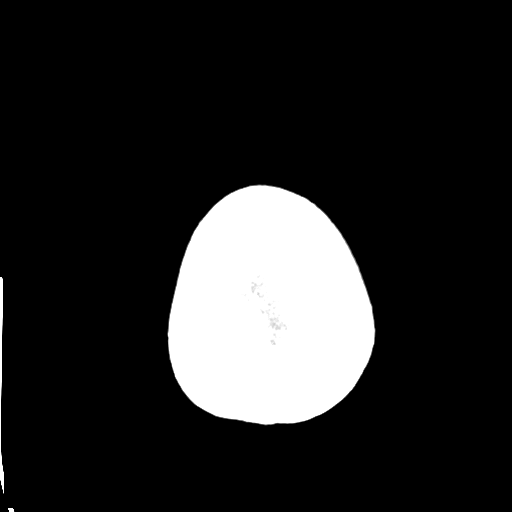
[im 28/34  bone]
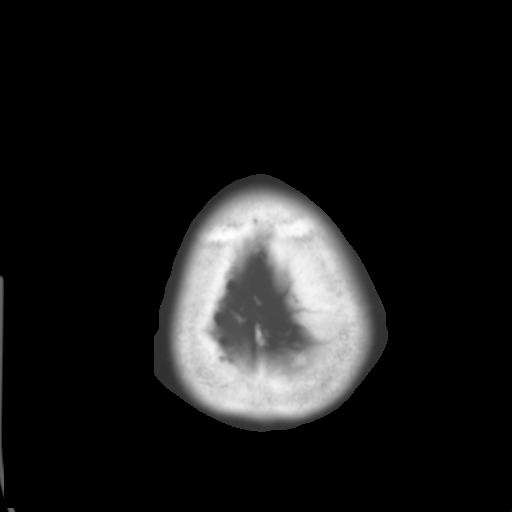
[im 31/34  brain]
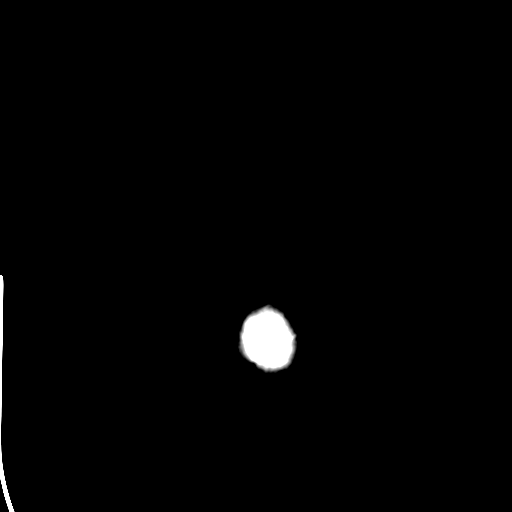

[Series 4: coronal soft tissue · coronal · 0.32mm/px · 3 of 66 slices shown]
[im 22/66  brain]
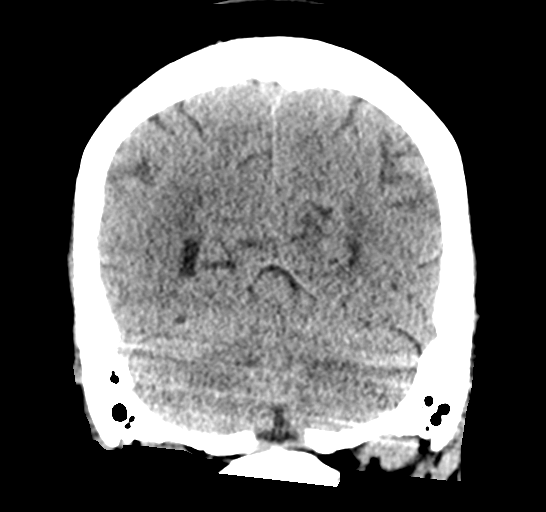
[im 29/66  brain]
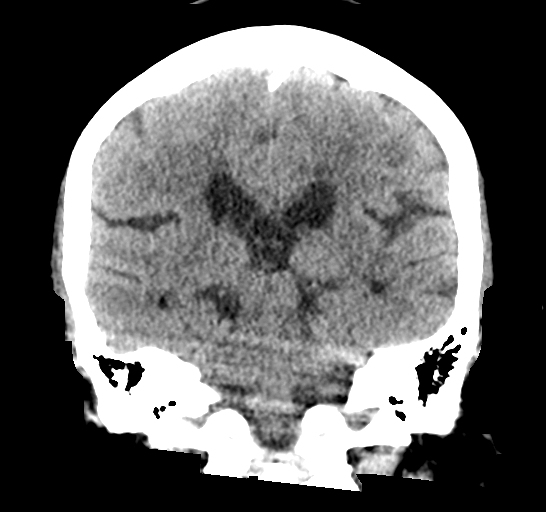
[im 37/66  brain]
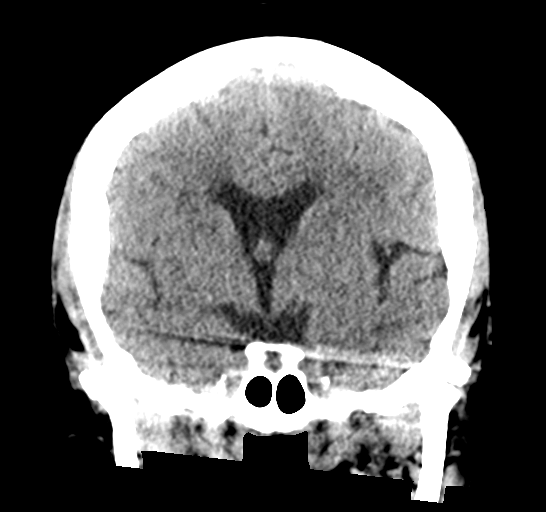

[Series 5: sagittal soft tissue · sagittal · 0.32mm/px · 3 of 59 slices shown]
[im 20/59  brain]
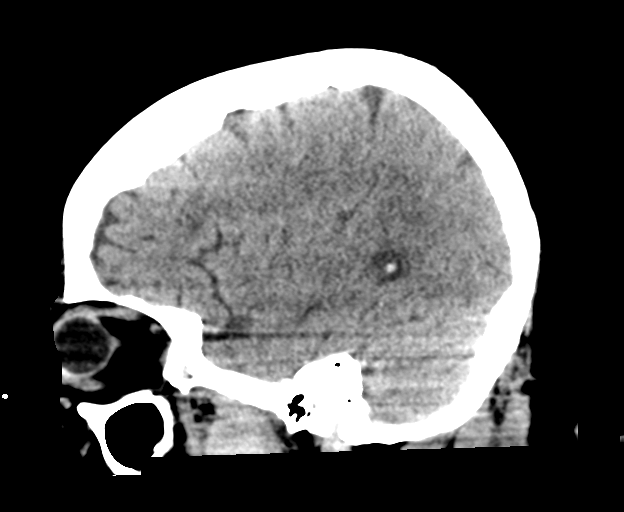
[im 30/59  brain]
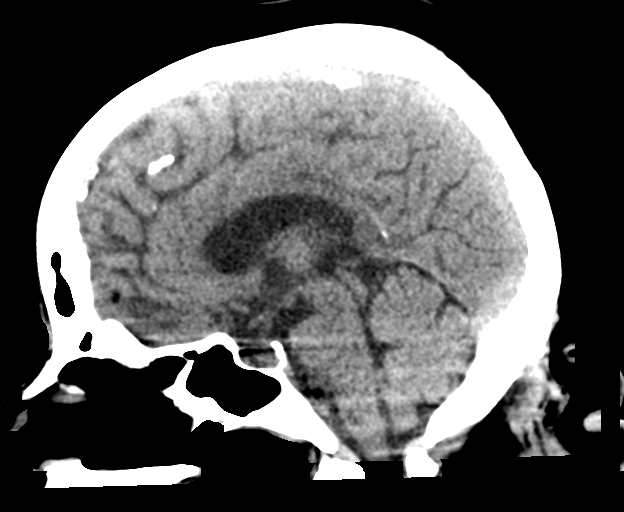
[im 39/59  brain]
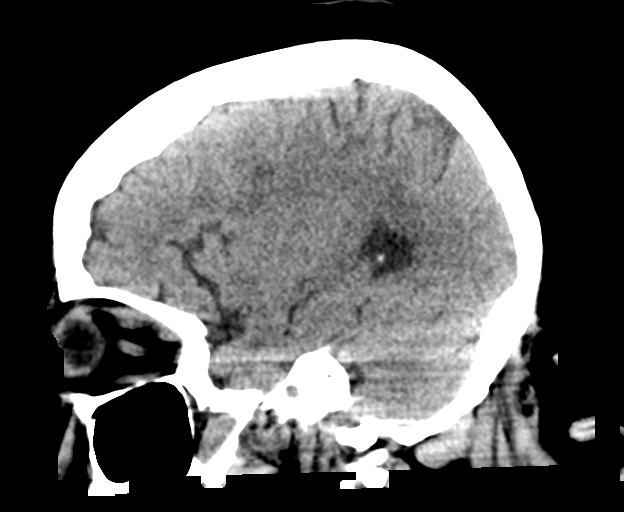

[16 of 47 positions shown; findings below may reference images not displayed]

FINDINGS: Brain:

Patchy and confluent areas of decreased attenuation are noted
throughout the deep and periventricular white matter of the cerebral
hemispheres bilaterally, compatible with chronic microvascular
ischemic disease.

No evidence of large-territorial acute infarction. No parenchymal
hemorrhage. No mass lesion. No extra-axial collection.

No mass effect or midline shift. No hydrocephalus. Basilar cisterns
are patent.

Vascular: No hyperdense vessel. Atherosclerotic calcifications are
present within the cavernous internal carotid arteries.

Skull: No acute fracture or focal lesion.

Sinuses/Orbits: Paranasal sinuses and mastoid air cells are clear.
Right lens replacement. Otherwise orbits are unremarkable.

Other: None.
IMPRESSION: No acute intracranial abnormality.

## 2021-02-12 MED ORDER — GABAPENTIN 100 MG PO CAPS
200.0000 mg | ORAL_CAPSULE | Freq: Two times a day (BID) | ORAL | Status: DC
  Administered 2021-02-12 – 2021-02-17 (×11): 200 mg via ORAL
  Filled 2021-02-12 (×11): qty 2

## 2021-02-12 MED ORDER — ACETAMINOPHEN 500 MG PO TABS
1000.0000 mg | ORAL_TABLET | Freq: Once | ORAL | Status: AC
  Administered 2021-02-12: 1000 mg via ORAL
  Filled 2021-02-12: qty 2

## 2021-02-12 MED ORDER — ACETAMINOPHEN 500 MG PO TABS
500.0000 mg | ORAL_TABLET | Freq: Four times a day (QID) | ORAL | Status: DC | PRN
  Administered 2021-02-12 – 2021-02-15 (×6): 500 mg via ORAL
  Filled 2021-02-12 (×6): qty 1

## 2021-02-12 MED ORDER — ALPRAZOLAM 0.5 MG PO TABS
0.5000 mg | ORAL_TABLET | Freq: Two times a day (BID) | ORAL | Status: DC | PRN
  Administered 2021-02-12 – 2021-02-17 (×11): 0.5 mg via ORAL
  Filled 2021-02-12 (×12): qty 1

## 2021-02-12 MED ORDER — LIDOCAINE 5 % EX PTCH
1.0000 | MEDICATED_PATCH | CUTANEOUS | Status: DC
  Administered 2021-02-12 – 2021-02-13 (×3): 1 via TRANSDERMAL
  Filled 2021-02-12 (×4): qty 1

## 2021-02-12 NOTE — Evaluation (Signed)
Physical Therapy Evaluation Patient Details Name: Chelsea [name] MRN: [id number] DOB: 1946/03/24 Today's Date: 02/12/2021   History of Present Illness  75yo F who with multiple recent trips to [facility] with frequent/regular falls and continued weakness at home, concerns about thriving at home. Pt sustained >10 falls in the last month. PMH: HTN, HLD, urinary incontinence, GAD. sizure d/o. 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 . Pt had a(+) COVID test during admission on 10/21/20.  Clinical Impression  Pt was able to tolerate some bed mobility and 2 brief standing efforts but is very limited with functional mobility.  She was able to take a few assisted, guarded side steps along EOB but ultimately struggled with function.  Pt has been having consistent/frequent falls at home and similarly was unsteady even with close assist from PT.  Pt is not at all safe to return to [facility] and will need STR at discharge; discussed need for her/family to be looking for higher level of care moving forward.    Follow Up Recommendations SNF;Supervision for mobility/OOB    Equipment Recommendations  None recommended by PT    Recommendations for Other Services       Precautions / Restrictions Precautions Precautions: Fall Restrictions Weight Bearing Restrictions: No      Mobility  Bed Mobility Overal bed mobility: Needs Assistance Bed Mobility: Supine to Sit;Sit to Supine     Supine to sit: Mod assist Sit to supine: Max assist;Mod assist   General bed mobility comments: Pt showed great effort in trying to get up to sitting, but despite extra time and cuing she needed significant assist.  Difficulty getting back into higher bed, heavy assist to get safely back to supine    Transfers Overall transfer level: Needs assistance Equipment used: Rolling walker (2 wheeled) Transfers: Sit to/from Stand Sit to Stand: Mod  assist;Min assist         General transfer comment: Pt nearly in standing (from high bed) once feet were on the floor.  Still needed direct phyiscal assist to get to upright.  First attempt with low grade buckling on R, better positioning/set up on second attempt with safer ability to shift weight over walker.  Ambulation/Gait             General Gait Details: no true ambulation able to take a ~6 very small, guarded, shuffling side steps along EOB.  Fatigued with the effort, O2 however stayed in the 90s, HR <100.  Stairs            Wheelchair Mobility    Modified Rankin (Stroke Patients Only)       Balance Overall balance assessment: Needs assistance Sitting-balance support: Bilateral upper extremity supported Sitting balance-Leahy Scale: Fair Sitting balance - Comments: Pt was able to maintain sitting balance EOB w/o direct assist.   Standing balance support: Bilateral upper extremity supported Standing balance-Leahy Scale: Poor Standing balance comment: Pt has an R knee buckle on initial standing attempt, better on subsequent efforts, but still highly reliant on walker, lacking confidence and needing occasional direct unweighting assist while trying to step/weight shift minimally                             Pertinent Vitals/Pain Pain Assessment: 0-10 Pain Score: 6  Pain Location: R shoulder Pain Descriptors / Indicators: Sore;Sharp Pain Intervention(s): Limited activity within patient's tolerance;Monitored during session;Premedicated before session  Home Living Family/patient expects to be discharged to:: [place]: [place] Rile - 2 wheels;Wheelchair - manual;Cane - single point;Walker - 4 wheels Additional Comments: [place] living    Prior Function Level of Independence: Needs assistance   Gait / Transfers Assistance Needed: Uses rollator for functional mobility. Endorses 5 falls in past month, with pt  attributing to knee buckling. Pt was recently d/c from home health PT  ADL's / Homemaking Assistance Needed: Requires increased time and adaptive equipment (i.e., reacher) for dressing.  Was receiving home health OT 2x/week, with goals to work on bathing d/t limited R UE ROM. ILF provides meals        Hand Dominance   Dominant Hand: Right    Extremity/Trunk Assessment   Upper Extremity Assessment Upper Extremity Assessment: Generalized weakness (R 3-/5, L 3+/5, chronic R side numbness)    Lower Extremity Assessment Lower Extremity Assessment: Generalized weakness (R 3-/5, L 3+/5, chronic R side numbness)       Communication   Communication: No difficulties  Cognition Arousal/Alertness: Awake/alert Behavior During Therapy: WFL for tasks assessed/performed Overall Cognitive Status: No family/caregiver present to determine baseline cognitive functioning                                        General Comments      Exercises     Assessment/Plan    PT Assessment Patient needs continued PT services  PT Problem List Decreased strength;Decreased range of motion;Decreased activity tolerance;Decreased balance;Decreased mobility;Decreased coordination;Decreased knowledge of use of DME;Decreased safety awareness;Cardiopulmonary status limiting activity       PT Treatment Interventions DME instruction;Gait training;Functional mobility training;Therapeutic activities;Therapeutic exercise;Balance training;Neuromuscular re-education;Patient/family education    PT Goals (Current goals can be found in the Care Plan section)  Acute Rehab PT Goals Patient Stated Goal: get strong enough to go back home, though realiving she may need to transition to ALF or similar PT Goal Formulation: With patient Time For Goal Achievement: 02/26/21 Potential to Achieve Goals: Fair    Frequency Min 2X/week   Barriers to discharge        Co-evaluation               AM-PAC  PT "6 Clicks" Mobility  Outcome Measure Help needed turning from your back to your side while in a flat bed without using bedrails?: A Little Help needed moving from lying on your back to sitting on the side of a flat bed without using bedrails?: A Lot Help needed moving to and from a bed to a chair (including a wheelchair)?: A Lot Help needed standing up from a chair using your arms (e.g., wheelchair or bedside chair)?: A Lot Help needed to walk in hospital room?: Total Help needed climbing 3-5 steps with a railing? : Total 6 Click Score: 11    End of Session Equipment Utilized During Treatment: Gait belt Activity Tolerance: Patient limited by fatigue Patient left: with call bell/phone within reach;in bed Nurse Communication: Mobility status PT Visit Diagnosis: Muscle weakness (generalized) (M62.81);Difficulty in walking, not elsewhere classified (R26.2);Unsteadiness on feet (R26.81)    Time: 1015-1050 PT Time Calculation (min) (ACUTE ONLY): 35 min   Charges:   PT Evaluation $PT Eval Low Complexity: 1 Low PT Treatments $Therapeutic Activity: 8-22 mins        Kreg Shropshire,  DPT 02/12/2021, 1:53 PM

## 2021-02-12 NOTE — Telephone Encounter (Signed)
I attempted to contact the patient again, no answer. I left a message on the patient vm.   I contacted [place] therapist on yesterday. She informed me that the patient stated that she fell and hit her head on yesterday. She recommended that the patient go to the ER, but the patient declined and became very irate and told her to leave. She said the patient was upset that she contacted our office to notify us of the fall. Melissa said she did not see any visible injuries and the patient denied having a headache, dizziness or visual issues. She said the patient seemed lethargic when she arrived. The pt was recommended by the therapist and triage nurse that she needed to be evaluated at the ER. She declined all recommendations.

## 2021-02-12 NOTE — Evaluation (Signed)
Occupational Therapy Evaluation Patient Details Name: Chelsea [name] MRN: [id number] DOB: 01/08/1946 Today's Date: 02/12/2021    History of Present Illness 75yo F who with multiple recent trips to [facility] with frequent/regular falls and continued weakness at home, concerns about thriving at home. Pt sustained >10 falls in the last month. PMH: HTN, HLD, urinary incontinence, GAD. sizure d/o. 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 . Pt had a(+) COVID test during admission on 10/21/20.   Clinical Impression   Pt seen for OT evaluation this date. Prior to admission, pt was MOD-I for functional mobility (with rollator) and dressing (with reacher), living alone in an independent living facility. Per chart review, pt had 10+ falls in past month, however pt reports 5 in past month. Prior to admission, pt required assistance for bathing and meal prep. Pt reports that she was receiving home health OT 2x/week and was recently discharged from home PT. Pt currently presents with decreased strength, balance, and activity tolerance, and requires MAX A for bed-level LB dressing, MIN-MAX A for bed mobility, and MOD A for sit<>stand transfers. Of note, after standing for 1 min with RW and MIN A from this author, pt with R knee buckling. Pt assisted back to bed, in no acute distress, and requesting to sleep. Pt would benefit from additional skilled OT services to maximize return to PLOF and minimize risk of future falls, injury, caregiver burden, and readmission. Upon discharge, recommend SNF services.        Follow Up Recommendations  SNF    Equipment Recommendations  Other (comment) (defer to next venue of care)       Precautions / Restrictions Precautions Precautions: Fall Restrictions Weight Bearing Restrictions: No      Mobility Bed Mobility Overal bed mobility: Needs Assistance Bed Mobility: Supine to Sit;Sit to Supine      Supine to sit: Min assist Sit to supine: Max assist;Mod assist   General bed mobility comments: MIN A to scoot hips towards EOB. Pt with difficulty getting back into elevated ED bed, requiring MAX A to return to supine    Transfers Overall transfer level: Needs assistance Equipment used: Rolling walker (2 wheeled) Transfers: Sit to/from Stand Sit to Stand: Mod assist;From elevated surface          Balance Overall balance assessment: Needs assistance Sitting-balance support: Bilateral upper extremity supported;Feet unsupported Sitting balance-Leahy Scale: Fair Sitting balance - Comments: Fair static sitting balance at EOB   Standing balance support: Bilateral upper extremity supported;During functional activity Standing balance-Leahy Scale: Poor Standing balance comment: R knee buckle after standing for ~24min                           ADL either performed or assessed with clinical judgement   ADL Overall ADL's : Needs assistance/impaired Eating/Feeding: Independent Eating/Feeding Details (indicate cue type and reason): pt able to use L UE with ease to bring sandwich to mouth and eat                 Lower Body Dressing: Maximal assistance;Bed level Lower Body Dressing Details (indicate cue type and reason): To don socks. Pt reports using a reacher to don/doff socks                                  Pertinent Vitals/Pain Pain Assessment: 0-10  Pain Score: 6  Pain Location: R shoulder Pain Descriptors / Indicators: Sore;Sharp Pain Intervention(s): Limited activity within patient's tolerance;Monitored during session;Premedicated before session     Hand Dominance Right   Extremity/Trunk Assessment Upper Extremity Assessment Upper Extremity Assessment: Generalized weakness (R 3-/5, L 3+/5, chronic R side numbness)   Lower Extremity Assessment Lower Extremity Assessment: Generalized weakness (R 3-/5, L 3+/5, chronic R side numbness)        Communication Communication Communication: No difficulties   Cognition Arousal/Alertness: Awake/alert Behavior During Therapy: WFL for tasks assessed/performed Overall Cognitive Status: No family/caregiver present to determine baseline cognitive functioning                                 General Comments: Pt A&Ox4. Tangential at times, however easily re-directed and agreeable throughout session              [place] expects to be discharged to:: [place]: Gilford Rile - 2 wheels;Wheelchair - manual;Cane - single point;Walker - 4 wheels   Additional Comments: [facility]      Prior Functioning/Environment Level of Independence: Needs assistance  Gait / Transfers Assistance Needed: Uses rollator for functional mobility. Endorses 5 falls in past month, with pt attributing to knee buckling. Pt was recently d/c from home health PT ADL's / Homemaking Assistance Needed: Requires increased time and adaptive equipment (i.e., reacher) for dressing.  Was receiving home health OT 2x/week, with goals to work on bathing d/t limited R UE ROM. ILF provides meals            OT Problem List: Decreased strength;Decreased activity tolerance;Impaired balance (sitting and/or standing)      OT Treatment/Interventions: Self-care/ADL training;Therapeutic exercise;Energy conservation;DME and/or AE instruction;Therapeutic activities;Patient/family education;Balance training    OT Goals(Current goals can be found in the care plan section) Acute Rehab OT Goals Patient Stated Goal: to get stronger OT Goal Formulation: With patient Time For Goal Achievement: 02/26/21 ADL Goals Pt Will Perform Lower Body Dressing: with adaptive equipment;sit to/from stand;with min assist Pt Will Transfer to Toilet: with min assist;stand pivot transfer;bedside commode Pt Will Perform Toileting - Clothing Manipulation and  hygiene: with min assist;sitting/lateral leans  OT Frequency: Min 1X/week    AM-PAC OT "6 Clicks" Daily Activity     Outcome Measure Help from another person eating meals?: None Help from another person taking care of personal grooming?: A Little Help from another person toileting, which includes using toliet, bedpan, or urinal?: A Lot Help from another person bathing (including washing, rinsing, drying)?: A Lot Help from another person to put on and taking off regular upper body clothing?: A Little Help from another person to put on and taking off regular lower body clothing?: A Lot 6 Click Score: 16   End of Session Equipment Utilized During Treatment: Gait belt;Rolling walker Nurse Communication: Mobility status  Activity Tolerance: Patient tolerated treatment well Patient left: in bed;with call bell/phone within reach  OT Visit Diagnosis: Unsteadiness on feet (R26.81);Repeated falls (R29.6);Muscle weakness (generalized) (M62.81)                Time: 3875-6433 OT Time Calculation (min): 28 min Charges:  OT General Charges $OT Visit: 1 Visit OT Evaluation $OT Eval Moderate Complexity: 1 Mod OT Treatments $Self  Care/Home Management : 8-22 mins  Fredirick Maudlin, OTR/[place]

## 2021-02-12 NOTE — ED Provider Notes (Signed)
[facility] Emergency Department Provider Note  ____________________________________________  Time seen: Approximately 3:31 AM  I have reviewed the triage vital signs and the nursing notes.   HISTORY  Chief Complaint Fall (Pt coming from home with complaints of multiple falls today. Pt uses walker to get around. Pt denies any complaints. )   HPI Chelsea [name] is a 75 y.o. female with history of spinal stenosis, arthritis, hypertension, hyperlipidemia who presents for evaluation after fall.  The patient has had 3 falls over the last 24 hours.  She usually called 911 for lift assist and since she is unable to stand up by herself.  She reports that she falls very frequently but after the third fall in 24 hours EMS really insisted that she came to the ED for evaluation.  She denies any injuries, denies head trauma, changes in her chronic back pain, unilateral weakness or numbness, slurred speech, facial droop, changes in her gait, nausea, vomiting, fever, chest pain, shortness of breath, neck pain.  Patient lives alone and has a walker.  She reports that all 3 falls were mechanical in nature.   Past Medical History:  Diagnosis Date  . Anxiety   . Arthritis   . Depression   . Hyperlipidemia   . Hypertension   . Seizures ([place])   . Spinal stenosis   . Ulcer   . Urinary incontinence     Patient Active Problem List   Diagnosis Date Noted  . Cervical myelopathy ([place]) 01/28/2021  . Mixed incontinence 01/28/2021  . Chronic anemia 09/26/2018  . Depression, major, recurrent, in partial remission ([place]) 01/11/2018  . Pure hypercholesterolemia 01/11/2018  . GAD (generalized anxiety disorder) 05/14/2011  . Essential hypertension 02/06/2011  . Spinal stenosis of lumbosacral region 02/06/2011    Past Surgical History:  Procedure Laterality Date  . ABDOMINAL HYSTERECTOMY    . BACK SURGERY     due to polio  . BREAST BIOPSY Bilateral    neg  . BREAST BIOPSY  Right 2011   neg/stereo  . CARPAL TUNNEL RELEASE    . CATARACT EXTRACTION Right 2020    Prior to Admission medications   Medication Sig Start Date End Date Taking? Authorizing Provider  acetaminophen (TYLENOL) 500 MG tablet Take 500 mg by mouth every 6 (six) hours as needed.    [provider]  ALPRAZolam Duanne Moron) 0.5 MG tablet Take 1 tablet (0.5 mg total) by mouth 2 (two) times daily as needed for anxiety. 01/03/21   Karamalegos, Devonne Doughty, DO  aspirin 325 MG tablet Take 325 mg by mouth daily.    [provider]  atorvastatin (LIPITOR) 40 MG tablet Take 1 tablet (40 mg total) by mouth daily. 02/28/20   Malfi, Lupita Raider, FNP  Cyanocobalamin (VITAMIN B 12 PO) Take 500 mcg by mouth daily.    [provider]  diclofenac Sodium (VOLTAREN) 1 % GEL Apply 4 g topically 4 (four) times daily. 12/17/20   Kathrine Haddock, NP  FLUoxetine (PROZAC) 20 MG capsule Take 60 mg by mouth every morning. 01/06/21   [provider]  gabapentin (NEURONTIN) 100 MG capsule Take 2 capsules (200 mg total) by mouth 2 (two) times daily. 01/03/21   Karamalegos, Devonne Doughty, DO  lidocaine (LIDODERM) 5 % Place 1 patch onto the skin daily. Remove & Discard patch within 12 hours or as directed by MD 10/24/20   Max Sane, MD  polyethylene glycol powder (GLYCOLAX/MIRALAX) 17 GM/SCOOP powder Take 17 g by mouth daily. 01/28/21  Jearld Fenton, NP  triamterene-hydrochlorothiazide (MAXZIDE-25) 37.5-25 MG tablet Take 1 tablet by mouth daily. 01/03/21   Karamalegos, Devonne Doughty, DO  simvastatin (ZOCOR) 40 MG tablet TAKE 1 TABLET BY MOUTH EVERY DAY 10/30/19 02/28/20  Olin Hauser, DO    Allergies Penicillins  Family History  Problem Relation Age of Onset  . Arthritis Mother   . Heart disease Mother   . Stroke Mother   . Hypertension Mother   . COPD Mother   . Sudden death Sister   . Other Father        unknown medical history  . Breast cancer Neg Hx     Social History Social History    Tobacco Use  . Smoking status: Never Smoker  . Smokeless tobacco: Never Used  Vaping Use  . Vaping Use: Never used  Substance Use Topics  . Alcohol use: Never  . Drug use: No    Review of Systems  Constitutional: Negative for fever. Eyes: Negative for visual changes. ENT: Negative for facial injury or neck injury Cardiovascular: Negative for chest injury. Respiratory: Negative for shortness of breath. Negative for chest wall injury. Gastrointestinal: Negative for abdominal pain or injury. Genitourinary: Negative for dysuria. Musculoskeletal: Negative for back injury, negative for arm or leg pain. Skin: Negative for laceration/abrasions. Neurological: Negative for head injury.   ____________________________________________   PHYSICAL EXAM:  VITAL SIGNS: ED Triage Vitals  Enc Vitals Group     BP 02/12/21 0156 135/81     Pulse Rate 02/12/21 0156 72     Resp 02/12/21 0156 20     Temp 02/12/21 0156 97.9 F (36.6 C)     Temp Source 02/12/21 0156 Oral     SpO2 02/12/21 0155 99 %     Weight 02/12/21 0159 180 lb (81.6 kg)     Height 02/12/21 0159 5' (1.524 m)     Head Circumference --      Peak Flow --      Pain Score 02/12/21 0157 8     Pain Loc --      Pain Edu? --      Excl. in [place]? --     Full spinal precautions maintained throughout the trauma exam. Constitutional: Alert and oriented. No acute distress. Does not appear intoxicated. HEENT Head: Normocephalic and atraumatic. Face: No facial bony tenderness. Stable midface Ears: No hemotympanum bilaterally. No Battle sign Eyes: No eye injury. PERRL. No raccoon eyes Nose: Nontender. No epistaxis. No rhinorrhea Mouth/Throat: Mucous membranes are moist. No oropharyngeal blood. No dental injury. Airway patent without stridor. Normal voice. Neck: no C-collar. No midline c-spine tenderness.  Cardiovascular: Normal rate, regular rhythm. Normal and symmetric distal pulses are present in all  extremities. Pulmonary/Chest: Chest wall is stable and nontender to palpation/compression. Normal respiratory effort. Breath sounds are normal. No crepitus.  Abdominal: Soft, nontender, non distended. Musculoskeletal: Nontender with normal full range of motion in all extremities. No deformities. No thoracic or lumbar midline spinal tenderness. Pelvis is stable. Skin: Skin is warm, dry and intact. No abrasions or contutions. Psychiatric: Speech and behavior are appropriate. Neurological: Normal speech and language. Moves all extremities to command. 5/5 strength of b/l UE and 4/5 of b/l LE. Gait deferred  Glascow Coma Score: 4 - Opens eyes on own 6 - Follows simple motor commands 5 - Alert and oriented GCS: 15   ____________________________________________   LABS (all labs ordered are listed, but only abnormal results are displayed)  Labs Reviewed  CBC WITH DIFFERENTIAL/PLATELET -  Abnormal; Notable for the following components:      Result Value   RBC 3.69 (*)    Hemoglobin 10.9 (*)    HCT 35.3 (*)    All other components within normal limits  COMPREHENSIVE METABOLIC PANEL - Abnormal; Notable for the following components:   CO2 33 (*)    Glucose, Bld 104 (*)    All other components within normal limits  URINALYSIS, COMPLETE (UACMP) WITH MICROSCOPIC - Abnormal; Notable for the following components:   Color, Urine STRAW (*)    APPearance CLEAR (*)    All other components within normal limits  CK   ____________________________________________  EKG  ED ECG REPORT I, Rudene Re, the attending physician, personally viewed and interpreted this ECG.  Sinus rhythm with a rate of 66, normal intervals, normal axis, no ST elevations or depressions with inferior Q waves. ____________________________________________  RADIOLOGY  I have personally reviewed the images performed during this visit and I agree with the Radiologist's read.   Interpretation by Radiologist:  CT  Head Wo Contrast  Result Date: 02/12/2021 CLINICAL DATA:  Status post fall EXAM: CT HEAD WITHOUT CONTRAST TECHNIQUE: Contiguous axial images were obtained from the base of the skull through the vertex without intravenous contrast. COMPARISON:  CT head 11/07/2020 FINDINGS: Brain: Patchy and confluent areas of decreased attenuation are noted throughout the deep and periventricular white matter of the cerebral hemispheres bilaterally, compatible with chronic microvascular ischemic disease. No evidence of large-territorial acute infarction. No parenchymal hemorrhage. No mass lesion. No extra-axial collection. No mass effect or midline shift. No hydrocephalus. Basilar cisterns are patent. Vascular: No hyperdense vessel. Atherosclerotic calcifications are present within the cavernous internal carotid arteries. Skull: No acute fracture or focal lesion. Sinuses/Orbits: Paranasal sinuses and mastoid air cells are clear. Right lens replacement. Otherwise orbits are unremarkable. Other: None. IMPRESSION: No acute intracranial abnormality. Electronically Signed   By: Iven Finn M.D.   On: 02/12/2021 03:24     ____________________________________________   PROCEDURES  Procedure(s) performed:yes .1-3 Lead EKG Interpretation Performed by: Rudene Re, MD Authorized by: Rudene Re, MD     Interpretation: non-specific     ECG rate assessment: normal     Rhythm: sinus rhythm     Ectopy: none     Conduction: normal     Critical Care performed:  None ____________________________________________   INITIAL IMPRESSION / ASSESSMENT AND PLAN / ED COURSE   75 y.o. female with history of spinal stenosis, arthritis, hypertension, hyperlipidemia who presents for evaluation after fall.  Patient with a history of recurrent falls however had 3 in the last 24 hours.  Denies any injuries.  On physical exam there is no obvious signs of trauma.  Her EKG showed no signs of dysrhythmias or ischemia.   Patient lives alone and is afraid of coming to the hospital when she falls because her family will want to place her somewhere outside her home.  Her vitals are within normal limits, mentation is at baseline, neuro exams at baseline.  Labs showing mild stable anemia, no signs of sepsis, no signs of dehydration or significant electrolyte derangement, no signs of rhabdo, UA negative for UTI.  Patient monitored on telemetry with no signs of dysrhythmias.  Will consult social work, PT and OT for safe discharge.  Old medical records reviewed.  Patient placed on telemetry for close monitoring.  No family currently at bedside       ____________________________________________  Please note:  Patient was evaluated in Emergency Department today for  the symptoms described in the history of present illness. Patient was evaluated in the context of the global COVID-19 pandemic, which necessitated consideration that the patient might be at risk for infection with the SARS-CoV-2 virus that causes COVID-19. Institutional protocols and algorithms that pertain to the evaluation of patients at risk for COVID-19 are in a state of rapid change based on information released by regulatory bodies including the CDC and federal and state organizations. These policies and algorithms were followed during the patient's care in the ED.  Some ED evaluations and interventions may be delayed as a result of limited staffing during the pandemic.   ____________________________________________   FINAL CLINICAL IMPRESSION(S) / ED DIAGNOSES   Final diagnoses:  Fall, initial encounter      NEW MEDICATIONS STARTED DURING THIS VISIT:  ED Discharge Orders    None       Note:  This document was prepared using Dragon voice recognition software and may include unintentional dictation errors.    Alfred Levins, [place], MD 02/12/21 [phone]

## 2021-02-12 NOTE — NC FL2 (Signed)
[place] LEVEL OF CARE SCREENING TOOL     IDENTIFICATION  Patient Name: NEKA BISE Birthdate: [birth year] Sex: female Admission Date (Current Location): 02/12/2021  [facility] and [place] Number:  Engineering geologist and Address:  [facility], [address] [place], [place] [address]      Provider Number: [id number]  Attending Physician Name and Address:  No att. providers found  Relative Name and Phone Number:  [name] (Daughter)   [phone]    Current Level of Care: Hospital Recommended Level of Care: [place] Prior Approval Number:    Date Approved/Denied:   PASRR Number: Pending  Discharge Plan: SNF    Current Diagnoses: Patient Active Problem List   Diagnosis Date Noted  . Cervical myelopathy ([place]) 01/28/2021  . Mixed incontinence 01/28/2021  . Chronic anemia 09/26/2018  . Depression, major, recurrent, in partial remission ([place]) 01/11/2018  . Pure hypercholesterolemia 01/11/2018  . GAD (generalized anxiety disorder) 05/14/2011  . Essential hypertension 02/06/2011  . Spinal stenosis of lumbosacral region 02/06/2011    Orientation RESPIRATION BLADDER Height & Weight     Self,Time,Situation,Place  Normal Incontinent Weight: 180 lb (81.6 kg) Height:  5' (152.4 cm)  BEHAVIORAL SYMPTOMS/MOOD NEUROLOGICAL BOWEL NUTRITION STATUS      Incontinent Diet  AMBULATORY STATUS COMMUNICATION OF NEEDS Skin   Limited Assist Verbally Normal                       Personal Care Assistance Level of Assistance  Bathing,Dressing,Total care,Feeding Bathing Assistance: Limited assistance Feeding assistance: Independent Dressing Assistance: Independent Total Care Assistance: Limited assistance   Functional Limitations Info  Sight,Hearing,Speech Sight Info: Adequate Hearing Info: Adequate Speech Info: Adequate    SPECIAL CARE FACTORS FREQUENCY       PT 5x per week  OT 5x per week                 Contractures Contractures Info: Not present    Additional Factors Info         Pfizer COVID-19 Vaccine 08/15/2020 , 11/28/2019 , 11/07/2019              Current Medications (02/12/2021):  This is the current hospital active medication list Current Facility-Administered Medications  Medication Dose Route Frequency Provider Last Rate Last Admin  . acetaminophen (TYLENOL) tablet 500 mg  500 mg Oral Q6H PRN Naaman Plummer, MD   500 mg at 02/12/21 1148  . ALPRAZolam Duanne Moron) tablet 0.5 mg  0.5 mg Oral BID PRN Naaman Plummer, MD   0.5 mg at 02/12/21 1148  . gabapentin (NEURONTIN) capsule 200 mg  200 mg Oral BID Naaman Plummer, MD   200 mg at 02/12/21 1010  . lidocaine (LIDODERM) 5 % 1 patch  1 patch Transdermal Q24H Rudene Re, MD   1 patch at 02/12/21 6734   Current Outpatient Medications  Medication Sig Dispense Refill  . acetaminophen (TYLENOL) 500 MG tablet Take 500 mg by mouth every 6 (six) hours as needed.    . ALPRAZolam (XANAX) 0.5 MG tablet Take 1 tablet (0.5 mg total) by mouth 2 (two) times daily as needed for anxiety. 60 tablet 2  . aspirin 325 MG tablet Take 325 mg by mouth daily.    Marland Kitchen atorvastatin (LIPITOR) 40 MG tablet Take 1 tablet (40 mg total) by mouth daily. 90 tablet 3  . Cyanocobalamin (VITAMIN B 12 PO) Take 500 mcg by mouth daily.    Marland Kitchen  diclofenac Sodium (VOLTAREN) 1 % GEL Apply 4 g topically 4 (four) times daily. 4 g 0  . FLUoxetine (PROZAC) 20 MG capsule Take 60 mg by mouth every morning.    . gabapentin (NEURONTIN) 100 MG capsule Take 2 capsules (200 mg total) by mouth 2 (two) times daily. 360 capsule 1  . lidocaine (LIDODERM) 5 % Place 1 patch onto the skin daily. Remove & Discard patch within 12 hours or as directed by MD 1 patch 0  . polyethylene glycol powder (GLYCOLAX/MIRALAX) 17 GM/SCOOP powder Take 17 g by mouth daily. 3350 g 1  . triamterene-hydrochlorothiazide (MAXZIDE-25) 37.5-25 MG tablet Take 1 tablet by mouth daily. 90 tablet 1      Discharge Medications: Please see discharge summary for a list of discharge medications.  Relevant Imaging Results:  Relevant Lab Results:   Additional Information SS# [id number]  Adelene Amas, LCSWA

## 2021-02-13 MED ORDER — FLUOXETINE HCL 20 MG PO CAPS
60.0000 mg | ORAL_CAPSULE | Freq: Every morning | ORAL | Status: DC
  Administered 2021-02-13 – 2021-02-17 (×5): 60 mg via ORAL
  Filled 2021-02-13 (×5): qty 3

## 2021-02-13 MED ORDER — ATORVASTATIN CALCIUM 20 MG PO TABS
40.0000 mg | ORAL_TABLET | Freq: Every day | ORAL | Status: DC
  Administered 2021-02-13 – 2021-02-17 (×5): 40 mg via ORAL
  Filled 2021-02-13 (×5): qty 2

## 2021-02-13 MED ORDER — ASPIRIN 325 MG PO TABS
325.0000 mg | ORAL_TABLET | Freq: Every day | ORAL | Status: DC
  Administered 2021-02-13 – 2021-02-17 (×5): 325 mg via ORAL
  Filled 2021-02-13 (×6): qty 1

## 2021-02-13 MED ORDER — TRIAMTERENE-HCTZ 37.5-25 MG PO TABS
1.0000 | ORAL_TABLET | Freq: Every day | ORAL | Status: DC
  Administered 2021-02-13 – 2021-02-17 (×5): 1 via ORAL
  Filled 2021-02-13 (×8): qty 1

## 2021-02-13 MED ORDER — LIDOCAINE 5 % EX PTCH
1.0000 | MEDICATED_PATCH | CUTANEOUS | Status: DC
  Administered 2021-02-13 – 2021-02-16 (×3): 1 via TRANSDERMAL
  Filled 2021-02-13 (×3): qty 1

## 2021-02-13 NOTE — ED Notes (Signed)
Pt asleep in bed at this time, VSS.Call bell within reach

## 2021-02-13 NOTE — ED Notes (Signed)
Pt asleep in bed at this time.

## 2021-02-13 NOTE — ED Notes (Signed)
Pt provided with breakfast tray, pancakes and sausage cut for patient by this RN. Pt eating independently at this time in bed. No complaints, no distress noted.

## 2021-02-13 NOTE — Telephone Encounter (Signed)
Pt called in wanting to speak to [facility] returning call, pt is currently admitted to hospital, pt was quite upset when questioned if I could take a message, She felt that how would she know why if she was called, I asked pt to hold to reach out to the office and she hung up. Calling from [phone] if fu needed

## 2021-02-13 NOTE — ED Notes (Signed)
Pt requesting xanax, stating she normally takes xanax up to 4x a day for anxiety, pt educated that she just received xanax around 0330 and if she receives another dose at this time that will be all she will be able to have for the day. Pt becoming upset, telling this RN that she wants to leave and that "y'all need to give me my medication like I take it at home, can't you see I'm anxious?" This RN attempted to educate patient multiple times on order being written for only two times daily, pt still stating that she needs more xanax than that. Pharmacy tech made aware that patient states prescriptions are wrong in chart and need to be updated.

## 2021-02-13 NOTE — ED Notes (Signed)
Pt given warm blanket and repositioned in bed at this time, purwick in place, bed sheet dry

## 2021-02-13 NOTE — ED Notes (Signed)
Pt asleep in bed at this time, Call bell within reach

## 2021-02-13 NOTE — ED Notes (Signed)
Report taken from JAARS, [place], care assumed at this time

## 2021-02-13 NOTE — ED Notes (Signed)
Pt noted to be yelling in room. When this nurse went to check on pt, pt asked "are you a doctor?" this nurse responded "no ma'am". Pt replied "then I don't need you I need a doctor". When asked why she needed a doctor Pt yelled "I'm in pain, you took my patch off and never came back". This nurse asked pt to stop yelling and explained that I had to make sure the doctor was ok with me rescheduling the lidocaine patch from 0330 to 2300. Then I had to call pharmacy and have them reschedule the patch. Pt continued to yell stated she wanted to speak to the doctor. And began pulling her gown off. Pt was asked to keep her clothes on because the door was opened. Pt refused. Another nurse heard her yelling and came to assist. Pt was notified that the hospitalist was upstairs and I would let him know she wanted to see him. Pt continued to yell at this nurse stating "you don't even know why I'm here that's why I need to talk to the doctor. Patch was applied to Pts Rt shoulder and asked if there was anything else we could do for her. Pt ignored this nurse. Call light within reach. Dr. Leonides Schanz notified.

## 2021-02-13 NOTE — ED Notes (Signed)
Pt transferred to hospital bed by this RN, Larene Beach, RN, Delilah Shan, RN, and [place], [place].  Linens changed, pillow propped under patient, new blanket given, meal tray found and given to patient.  Monitoring cords and call bell in place, fall alarm set on admission bed and in lowest setting, lights turned down per request and patient denies other needs at this time.  Primary RN made aware.

## 2021-02-13 NOTE — ED Provider Notes (Signed)
-----------------------------------------   5:26 AM on 02/13/2021 -----------------------------------------   Blood pressure 124/70, pulse 75, temperature 98 F (36.7 C), temperature source Oral, resp. rate 19, height 5' (1.524 m), weight 81.6 kg, SpO2 96 %.  The patient is calm and cooperative at this time.  There have been no acute events since the last update.  Awaiting disposition plan from Social Work team.   Paulette Blanch, MD 02/13/21 [phone]

## 2021-02-13 NOTE — TOC Progression Note (Addendum)
Transition of Care [facility] - Progression Note    Patient Details  Name: Chelsea [name] MRN: [id number] Date of Birth: [birth year]  Transition of Care [facility]) CM/SW [place], [place] Phone Number:  [phone] 02/13/2021, 11:09 AM  Clinical Narrative:     SNF placement search started. PASRR# [id number].  Patient lives at [facility].  Patient's main contact is [name] (Daughter) [phone].  CSW called and left Ms. Turner a voicemail, requested a return call. Patient does not want [facility].      Barriers to Discharge: SNF Pending bed offer  Expected Discharge Plan and Services                                                 Social Determinants of Health (SDOH) Interventions    Readmission Risk Interventions No flowsheet data found.

## 2021-02-13 NOTE — ED Notes (Addendum)
Pt asleep in bed at this time. VSS. Call bell within reach 

## 2021-02-13 NOTE — ED Notes (Signed)
Pt asleep in bed at this time. VSS. Call bell within reach

## 2021-02-13 NOTE — ED Notes (Signed)
Pt advising this RN that she was never given lunch tray, this RN attempted to call dietary several time without an answer. Pt provided sandwich tray per request and gingerale with ice.

## 2021-02-13 NOTE — ED Notes (Signed)
MD Tamala Julian made aware that patient's home meds have been reviewed by pharmacy and need to be updated in [facility].

## 2021-02-14 ENCOUNTER — Ambulatory Visit: Payer: Self-pay | Admitting: *Deleted

## 2021-02-14 MED ORDER — DICLOFENAC SODIUM 1 % EX GEL
4.0000 g | Freq: Four times a day (QID) | CUTANEOUS | Status: DC
  Administered 2021-02-14 – 2021-02-16 (×7): 4 g via TOPICAL
  Filled 2021-02-14: qty 100

## 2021-02-14 MED ORDER — POLYETHYLENE GLYCOL 3350 17 G PO PACK
17.0000 g | PACK | Freq: Every day | ORAL | Status: DC
  Filled 2021-02-14 (×3): qty 1

## 2021-02-14 MED ORDER — LIDOCAINE 5 % EX PTCH: 1.0000 | MEDICATED_PATCH | CUTANEOUS | Status: DC

## 2021-02-14 NOTE — ED Notes (Signed)
Pt call light going off, this RN answered.  Pt starts yelling at this RN that she needs a wheelchair and wants to get up, that she is uncomfortable in the bed, states she hasn't seen anyone for hours "except the trash lady" or been fed.  Patient attempting to pull gown down, states "if I pull this tube out urine will go everywhere".  Patient speaking continuously and interrupting this RN from speaking.  This RN finally stated there is no wheelchair or other place for this patient to sit at this time and would need to let this RN locate a chair.  Patient looked at RN and made waving motion stating "so why are you still in here?"  This RN finally able to mention to patient a hospital bed will be ordered for patient and primary RN would be informed of concerns.  Call bell in place when this RN left room, patient still yelling at RN.

## 2021-02-14 NOTE — Telephone Encounter (Signed)
Pt. Called in again. Does not know why "ya'll keep calling me.I don't need anything. I'm waiting for them to find me some place to live."

## 2021-02-14 NOTE — ED Notes (Signed)
Dietary contacted to obtain lunch tray for patient.

## 2021-02-14 NOTE — ED Notes (Signed)
PT at bedside working with patient at this time.

## 2021-02-14 NOTE — ED Notes (Signed)
Pt pulled up in bed, repositioned for comfort

## 2021-02-14 NOTE — Telephone Encounter (Signed)
Called pt left VM to call back.  Will route this note to [facility] Nurse just in case the patient returns call to clinic. PEC Nurse may give patient results if pt returns the call.  CRM has also been created for this msg for call center purposes. Please attach any notes regarding this lab to this result message and do not create a new CRM.   KP

## 2021-02-14 NOTE — TOC Progression Note (Signed)
Transition of Care [facility]) - Progression Note    Patient Details  Name: Chelsea [name] MRN: [id number] Date of Birth: Dec 23, 1945  Transition of Care [facility]) CM/SW [place], [place] Phone Number:  [phone] 02/14/2021, 8:19 AM  Clinical Narrative:     Patient has bed offer at [facility], however patient is o co-pay days.  CSW spoke with patient's daughter [name] [phone] and confirmed co-pay days.  Ms. Renea Ee stated she is working on getting private care arrangements for her mother and should have an update on that some time today.  CSW agreed to contact Ms. Renea Ee later today.    Barriers to Discharge: SNF Pending bed offer  Expected Discharge Plan and Services                                                 Social Determinants of Health (SDOH) Interventions    Readmission Risk Interventions No flowsheet data found.

## 2021-02-14 NOTE — Telephone Encounter (Signed)
Nothing I can do for her while she is in the ED. She will have to await placement from the social worker. The ED has the same list of medications I have in Epic.

## 2021-02-14 NOTE — Telephone Encounter (Signed)
FYI  Called pt left VM to call back.  Will route this note to [facility] Nurse just in case the patient returns call to clinic. PEC Nurse may give patient results if pt returns the call.  CRM has also been created for this msg for call center purposes. Please attach any notes regarding this lab to this result message and do not create a new CRM.   KP

## 2021-02-14 NOTE — ED Provider Notes (Signed)
Today's Vitals   02/13/21 1500 02/13/21 1900 02/13/21 2130 02/13/21 2201  BP:   (!) 153/75   Pulse: 79  75   Resp:   16   Temp:   98.5 F (36.9 C)   TempSrc:   Oral   SpO2: 99%  97%   Weight:      Height:      PainSc:  2   7    Body mass index is 35.15 kg/m.   4:00 AM  Patient resting at this time.  No acute events overnight.  Awaiting placement per SW.   Dejanae Helser, Delice Bison, DO 02/14/21 0400

## 2021-02-14 NOTE — ED Notes (Signed)
AM med, breakfast tray provided.  Pur wick canister emptied.

## 2021-02-14 NOTE — TOC Progression Note (Addendum)
Transition of Care [facility]) - Progression Note    Patient Details  Name: TANDREA KOMMER MRN: [id number] Date of Birth: 05-29-1946  Transition of Care [facility]) CM/SW [place], [place] Phone Number: [phone] 02/14/2021, 12:51 PM  Clinical Narrative:     CSW spoke with patient's daughter [name] [phone].  She has found a private care giver for the patient in a new location.  Ms. Renea Ee stated they placement will not be ready until Monday, May 16th, 2022.  Patient is not allowed to return to [facility] due to falls and care needs exceed facility capacities.   [facility] has been contacted for home health needs, PT, OT, RN and Aide.  EDP and ED RN updated. Home health orders requested.  Patient does not have DME needs at this time and has both a walker and bedside commode.    Barriers to Discharge: SNF Pending bed offer  Expected Discharge Plan and Services                                                 Social Determinants of Health (SDOH) Interventions    Readmission Risk Interventions No flowsheet data found.

## 2021-02-14 NOTE — ED Notes (Addendum)
Offered sandwich tray. Patient declined. Secretary notifying dietary to obtain hot meal for patient.

## 2021-02-14 NOTE — Telephone Encounter (Signed)
Pt called in but hung up before I was able to speak to her.   I called her back.  It was difficult to determine what the circumstances were.  Pt is calling from the ED at [facility].   She was brought in by EMS because she has fallen too many times at home.   She lives alone and has called EMS apparently on several occasions to come get her up.   After this last fall they transported her to the hospital ED.   She has been held in the ED for 2 days per pt.  She is c/o multiple things, the food, her medications are all wrong, feels she is homeless because they won't let her return home because she lives alone and has fallen too many times.   I asked her if any of her family was involved.   Pt said her daughter has been talking with the social worker there at the hospital.   "My daughter is supposed to be traveling but had to come home because of me".    At this point I looked in her chart to determine what was going on.  She is in the ED at [facility] now.   According to the note she is waiting on social work to make arrangements for placement.  Per the notes she fell 3 times within 24 hours so EMS transported her to the ED.  She is unhappy with "being stuck in this room".   "I need my medications straightened out".   "They are not giving my medications right".   "If I could I would walk right out of this place".    I let her know I would let Webb Silversmith, NP with [facility] know she is in the ED and the situation with social work looking for her a place to stay.  She is requesting Rollene Fare send in a correct list of her medications to the hospital because "they have them all messed up here".  I sent my notes to [facility] for Webb Silversmith, NP.       Reason for Disposition . [1] Caller requesting NON-URGENT health information AND [2] PCP's office is the best resource    Pt in the ED at [facility] awaiting placement by  social work after falling 3 times within a 24 hour period.   EMS transported her to the ED.  Answer Assessment - Initial Assessment Questions 1. REASON FOR CALL or QUESTION: "What is your reason for calling today?" or "How can I best help you?" or "What question do you have that I can help answer?"     See documentation notes for all the details  Protocols used: [place]

## 2021-02-14 NOTE — Progress Notes (Signed)
Physical Therapy Treatment Patient Details Name: Chelsea [name] MRN: [id number] DOB: 1945-10-08 Today's Date: 02/14/2021    History of Present Illness 75yo F who with multiple recent trips to [facility] with frequent/regular falls and continued weakness at home, concerns about thriving at home. Pt sustained >10 falls in the last month. PMH: HTN, HLD, urinary incontinence, GAD. sizure d/o. 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 . Pt had a(+) COVID test during admission on 10/21/20.    PT Comments    Pt seen in ED, pt in a wet bed with urine, nursing aware. Pt unable to reposition in bed due to severe weakness, concerned that staff is not helping her reposition enough, asked PT to come see her to help her sit up tall or get to a chair. Pt requires Max-totalA +2 to get to EOB, can achieve postural neutral in sagittal plane with min-modA. Still unable to come to standing, even with RW and modA; MaxA SPT to and from chair to facilitate linen change. Pt left in bed in semirecumbent position. Pt does not have good insight into how her current limitations would impact her safety and independence at home, very fixated on return to home environment.   Follow Up Recommendations  SNF;Supervision for mobility/OOB;Supervision - Intermittent;Other (comment) (pt cannot reposiition in bed ad lib due to weakness)     Equipment Recommendations  None recommended by PT    Recommendations for Other Services       Precautions / Restrictions Precautions Precautions: Fall Restrictions Weight Bearing Restrictions: No    Mobility  Bed Mobility Overal bed mobility: Needs Assistance Bed Mobility: Supine to Sit;Sit to Supine     Supine to sit: Max assist;+2 for physical assistance Sit to supine: Max assist;+2 for physical assistance        Transfers Overall transfer level: Needs assistance Equipment used: Rolling walker (2 wheeled);1  person hand held assist Transfers: Sit to/from [facility] Sit to Stand: Max assist Stand pivot transfers: Max assist       General transfer comment: unable to achieve locked knees, frequent buckling with collapse to chair or bed, RW does not help this. Author provides squat pivot transfer.  Ambulation/Gait Ambulation/Gait assistance:  (too weak to attempt.)               Stairs             Wheelchair Mobility    Modified Rankin (Stroke Patients Only)       Balance     Sitting balance-Leahy Scale: Poor     Standing balance support: Bilateral upper extremity supported;During functional activity Standing balance-Leahy Scale: Zero                              Cognition Arousal/Alertness: Awake/alert Behavior During Therapy: WFL for tasks assessed/performed Overall Cognitive Status: No family/caregiver present to determine baseline cognitive functioning                                        Exercises      General Comments        Pertinent Vitals/Pain Pain Assessment: 0-10 Pain Score: 10-Worst pain ever Pain Location: R shoulder, and spinal stenosis Pain Intervention(s): Limited activity within patient's tolerance;Repositioned    Home Living  Prior Function            PT Goals (current goals can now be found in the care plan section) Acute Rehab PT Goals Patient Stated Goal: to get stronger PT Goal Formulation: With patient Time For Goal Achievement: 02/26/21 Potential to Achieve Goals: Fair Progress towards PT goals: Not progressing toward goals - comment    Frequency    Min 2X/week      PT Plan Current plan remains appropriate    Co-evaluation              AM-PAC PT "6 Clicks" Mobility   Outcome Measure  Help needed turning from your back to your side while in a flat bed without using bedrails?: Total Help needed moving from lying on your back to  sitting on the side of a flat bed without using bedrails?: Total Help needed moving to and from a bed to a chair (including a wheelchair)?: Total Help needed standing up from a chair using your arms (e.g., wheelchair or bedside chair)?: Total Help needed to walk in hospital room?: Total Help needed climbing 3-5 steps with a railing? : Total 6 Click Score: 6    End of Session Equipment Utilized During Treatment: Gait belt Activity Tolerance: Patient limited by fatigue Patient left: with call bell/phone within reach;in bed Nurse Communication: Mobility status PT Visit Diagnosis: Muscle weakness (generalized) (M62.81);Difficulty in walking, not elsewhere classified (R26.2);Unsteadiness on feet (R26.81)     Time: 5784-6962 PT Time Calculation (min) (ACUTE ONLY): 33 min  Charges:  $Therapeutic Exercise: 23-37 mins                     2:26 PM, 02/14/21 Etta Grandchild, PT, DPT Physical Therapist - [facility]  [phone] ([place])    [place] 02/14/2021, 2:23 PM

## 2021-02-15 NOTE — ED Notes (Signed)
Pt c/o pain to right shoulder. Pt noted to have Lidocaine patch in place with recent application of Voltaren gel. Pt repositioned in bed and placed pillow under arm for support. Pt reports improvement in pain.

## 2021-02-15 NOTE — ED Notes (Signed)
Breakfast tray to patient.

## 2021-02-15 NOTE — ED Notes (Signed)
Report to Zach

## 2021-02-15 NOTE — ED Notes (Signed)
Repositioned and straightened up in bed, reports neck pain is better.  Resting in bed, interested in when breakfast tray will arrive.

## 2021-02-15 NOTE — ED Notes (Signed)
Helped to position up to side of bed to eat breakfast, tolerating well.  Advised to used call light for assistance, verbalized understanding.

## 2021-02-15 NOTE — ED Notes (Signed)
Resting in room, lights dimmed.  States pain holding at 6/10, voltaren cream to right shoulder area

## 2021-02-15 NOTE — ED Notes (Signed)
Medications administered as prescribed.  Patient repositioned in bed.

## 2021-02-16 NOTE — ED Notes (Signed)
Patient provided with additional drink and ice, patient lights dimmed, patient calling family member at this time.

## 2021-02-16 NOTE — ED Notes (Signed)
This tech into assist pt d/t pt continuously pressing on call bell; pt demands this tech to take out dirty gown from the linen basket that she previously wore to see how much blood was on gown. Pt wanting to know why blood is on the gown, explained to pt that nurse has already explained the reason; pt gets more agitated and demands to speak to "someone who knows what they are doing". Shirlean Mylar, RN notified.

## 2021-02-16 NOTE — ED Notes (Signed)
Patient yelling and screaming for staff intermittently. Patient demanding additional food/ice cream. Provided at this time.

## 2021-02-16 NOTE — ED Notes (Addendum)
Pt eating a honeybun and pepsi at this time, changed out of gown into new gown, denies further needs at this time. Call bell and personal items within reach.

## 2021-02-16 NOTE — ED Notes (Signed)
Multiple staff members attempted to help patient but she refused to state what she needed help with. Patient dialed 911 from room. Patient informed she cannot dial 911 from room, and that staff is here to assist her. Patient provided with lunch tray. Tech assisted patient.

## 2021-02-16 NOTE — ED Notes (Signed)
Patient provided with additional food by ER tech.

## 2021-02-16 NOTE — ED Provider Notes (Signed)
Today's Vitals   02/15/21 1230 02/15/21 2150 02/16/21 0000 02/16/21 0258  BP:  (!) 148/88 138/69   Pulse:  82 80   Resp:  15 17   Temp:      TempSrc:      SpO2:  97% 96%   Weight:      Height:      PainSc: 4    Asleep   Body mass index is 35.15 kg/m.  5:30 AM  Pt resting comfortably.  No acute events overnight.  Awaiting social work placement.   Chelsea [name], Delice Bison, DO 02/16/21 0530

## 2021-02-16 NOTE — ED Notes (Signed)
Patient sleeping at this time, NADN, RR even and non labored.

## 2021-02-17 NOTE — ED Notes (Signed)
This RN informed dietary what pt requested to eat, pt states she does not want anything but ordered anyway

## 2021-02-17 NOTE — ED Notes (Signed)
PT continuing to work with pt

## 2021-02-17 NOTE — ED Notes (Signed)
Entered room to find pt resting with eyes closed; RR even and unlabored on RA with symmetrical rise and fall of chest - no acute distress.  Pt easily aroused at this time for vital sign re-assessment.  After vitals completed pt denies any additional questions, concerns, needs.

## 2021-02-17 NOTE — TOC Progression Note (Addendum)
Transition of Care [facility]) - Progression Note    Patient Details  Name: Chelsea [name] MRN: [id number] Date of Birth: 02-20-1946  Transition of Care [facility]) CM/SW [place], [place] Phone Number: [phone] 02/17/2021, 11:33 AM  Clinical Narrative:     CSW received update from daughter [name] [phone].  Ms. Renea Ee stated she will be here at 1:00PM to transport patient to new placement.     Barriers to Discharge: SNF Pending bed offer  Expected Discharge Plan and Services                                                 Social Determinants of Health (SDOH) Interventions    Readmission Risk Interventions No flowsheet data found.

## 2021-02-17 NOTE — ED Notes (Signed)
Late entry- This nurse assumed care - pt resting with eyes closed; RR even and unlabored on RA with symmetrical rise and fall of chest- no acute distress.  Will monitor for acute changes and maintain plan of care as pt awaits placement.

## 2021-02-17 NOTE — ED Provider Notes (Signed)
Procedures     ----------------------------------------- 1:44 PM on 02/17/2021 -----------------------------------------  Family has arranged for patient to have 24-hour home health aide and will be transported back to private home care.  She is medically stable.    Carrie Mew, MD 02/17/21 1344

## 2021-02-17 NOTE — ED Notes (Signed)
Pt visualized resting with RR even and unlabored.  

## 2021-02-17 NOTE — ED Notes (Signed)
PT at bedside.

## 2021-02-17 NOTE — TOC Transition Note (Signed)
Transition of Care [facility]) - CM/SW Discharge Note   Patient Details  Name: Chelsea [name] MRN: [id number] Date of Birth: 1946/09/23  Transition of Care [facility]) CM/SW Contact:  Ova Freshwater Phone Number: [phone]  02/17/2021, 11:44 AM   Clinical Narrative:     Patient will d/c to new placement with 24 hr private caregiver.  Patient's daughter [name] [phone], confirmed d/c.  Patient will be transported to new placement by Claire Shown [phone]. EDP/ED Staff have been updated.    Barriers to Discharge: SNF Pending bed offer   Patient Goals and CMS Choice        Discharge Placement                       Discharge Plan and Services                                     Social Determinants of Health (SDOH) Interventions     Readmission Risk Interventions No flowsheet data found.

## 2021-02-17 NOTE — ED Notes (Signed)
Entered room to bring patient ice as requested - pt states "that was 20 minutes ago" - educated pt that this nurse was providing other patient care-- pt then requested cranberry juices at bedside be opened and poured into cup with ice.  Pt also requested bedside table be cleaned off.  Denies any additional needs, questions, concerns at this time

## 2021-02-17 NOTE — TOC Progression Note (Signed)
Transition of Care [facility]) - Progression Note    Patient Details  Name: Chelsea [name] MRN: [id number] Date of Birth: 30-Dec-1945  Transition of Care [facility]) CM/SW [place], [place] Phone Number: [phone] 02/17/2021, 9:30 AM  Clinical Narrative:     CSW left voicemail for patient's daughter [name] [phone], to confirm transport time for patient.    Barriers to Discharge: SNF Pending bed offer  Expected Discharge Plan and Services                                                 Social Determinants of Health (SDOH) Interventions    Readmission Risk Interventions No flowsheet data found.

## 2021-02-17 NOTE — Progress Notes (Signed)
Occupational Therapy Treatment Patient Details Name: Chelsea [name] MRN: [id number] DOB: 1946/06/11 Today's Date: 02/17/2021    History of present illness 75yo F who with multiple recent trips to [facility] with frequent/regular falls and continued weakness at home, concerns about thriving at home. Pt sustained >10 falls in the last month. PMH: HTN, HLD, urinary incontinence, GAD. sizure d/o. 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 . Pt had a(+) COVID test during admission on 10/21/20.   OT comments  Pt seen for OT treatment on this date. Upon arrival to room, pt awake and highly motivated to participate in session. Pt required MOD A to perform supine>sit transfer. While seated EOB, pt attempted to don socks via figure-4 position, however required MAX A to don over heels and adjust socks in setting of decreased dynamic sitting balance. Pt engaged in seated UB exercises with theraband (red) consisting of L shoulder flexion, elbow flexion of b/l UE, and chair push-ups. Pt performed x2 sit<>stand transfers with MOD A. While standing, pt able to maintain standing balance without knee buckling, however required MAX A to doff briefs. With b/l UE on RW, pt required MAX A to march in place d/t L knee buckling. Pt continues to benefit from skilled OT services to maximize return to PLOF and minimize risk of future falls, injury, caregiver burden, and readmission. Will continue to follow POC. Discharge recommendation remains appropriate.    Follow Up Recommendations  SNF    Equipment Recommendations  Other (comment) (defer to next venue of care)       Precautions / Restrictions Precautions Precautions: Fall Restrictions Weight Bearing Restrictions: No       Mobility Bed Mobility Overal bed mobility: Needs Assistance Bed Mobility: Supine to Sit;Sit to Supine     Supine to sit: Mod assist Sit to supine: Min assist         Transfers Overall transfer level: Needs assistance Equipment used: Rolling walker (2 wheeled) Transfers: Sit to/from Stand Sit to Stand: Mod assist         General transfer comment: x2 bouts. Required verbal cues for safe hand positioning and cues for body postioning to acheive upright posture. No knee buckling observed with static standing    Balance Overall balance assessment: Needs assistance Sitting-balance support: No upper extremity supported;Feet supported Sitting balance-Leahy Scale: Fair Sitting balance - Comments: Fair static sitting balance at EOB. Lateral lean during attempts to participate in LB dressing, requiring MIN A to return to midline position   Standing balance support: Bilateral upper extremity supported;During functional activity Standing balance-Leahy Scale: Zero Standing balance comment: With b/l UE on RW, pt required MAX A to march in place d/t L knee buckling                           ADL either performed or assessed with clinical judgement   ADL Overall ADL's : Needs assistance/impaired                     Lower Body Dressing: Maximal assistance;Sit to/from stand Lower Body Dressing Details (indicate cue type and reason): To don socks and doff briefs             Functional mobility during ADLs: Maximal assistance;Rolling walker (MAX A to march in place)                 Cognition Arousal/Alertness: Awake/alert Behavior During  Therapy: WFL for tasks assessed/performed Overall Cognitive Status: No family/caregiver present to determine baseline cognitive functioning                                 General Comments: Pt agreeable and highly motivated to participate in therapy this date.        Exercises General Exercises - Upper Extremity Shoulder Flexion: AROM;Strengthening;Left;10 reps;Seated;Theraband Theraband Level (Shoulder Flexion): Level 2 (Red) Elbow Flexion: AROM;Strengthening;Both;10  reps;Seated;Theraband Theraband Level (Elbow Flexion): Level 2 (Red) Chair Push Up: Strengthening;Both;10 reps;Seated           Pertinent Vitals/ Pain       Pain Assessment: 0-10 Pain Score: 5  Pain Location: R shoulder Pain Descriptors / Indicators: Sore;Sharp Pain Intervention(s): Limited activity within patient's tolerance;Monitored during session         Frequency  Min 1X/week        Progress Toward Goals  OT Goals(current goals can now be found in the care plan section)  Progress towards OT goals: Progressing toward goals  Acute Rehab OT Goals Patient Stated Goal: to get stronger OT Goal Formulation: With patient Time For Goal Achievement: 02/26/21  Plan Discharge plan remains appropriate;Frequency remains appropriate       AM-PAC OT "6 Clicks" Daily Activity     Outcome Measure   Help from another person eating meals?: None Help from another person taking care of personal grooming?: A Little Help from another person toileting, which includes using toliet, bedpan, or urinal?: A Lot Help from another person bathing (including washing, rinsing, drying)?: A Lot Help from another person to put on and taking off regular upper body clothing?: A Little   6 Click Score: 14    End of Session Equipment Utilized During Treatment: Gait belt;Rolling walker  OT Visit Diagnosis: Unsteadiness on feet (R26.81);Repeated falls (R29.6);Muscle weakness (generalized) (M62.81)   Activity Tolerance Patient tolerated treatment well   Patient Left in bed;with call bell/phone within reach;with nursing/sitter in room   Nurse Communication Mobility status        Time: 3557-3220 OT Time Calculation (min): 43 min  Charges: OT General Charges $OT Visit: 1 Visit OT Treatments $Self Care/Home Management : 38-52 mins  Fredirick Maudlin, OTR/[facility] [place]

## 2021-02-17 NOTE — ED Notes (Signed)
Pt visualized to be resting at this time with RR even and unlabored

## 2021-02-17 NOTE — ED Provider Notes (Signed)
-----------------------------------------   6:39 AM on 02/17/2021 -----------------------------------------   Blood pressure 112/61, pulse 98, temperature 98 F (36.7 C), temperature source Oral, resp. rate 16, height 5' (1.524 m), weight 81.6 kg, SpO2 96 %.  The patient is calm and cooperative at this time.  There have been no acute events since the last update.  Awaiting disposition plan from Social Work team.   Paulette Blanch, MD 02/17/21 [phone]

## 2021-02-17 NOTE — ED Notes (Signed)
Pt eating breakfast independently at this time. Repositioned in bed by PT. purewick changed

## 2021-02-20 ENCOUNTER — Telehealth: Payer: Self-pay | Admitting: Internal Medicine

## 2021-02-20 NOTE — Telephone Encounter (Signed)
I called and spoke with Lorrie and she is aware to proceed.

## 2021-02-20 NOTE — Telephone Encounter (Signed)
Called to request verbal for nursing care - 1xwk 1, 2xwk 7 and 1xwk 6.  For questions, please call [phone]

## 2021-02-20 NOTE — Telephone Encounter (Signed)
Yes. Thank you  Nobie Putnam, [facility] 02/20/2021, 1:07 PM

## 2021-02-23 ENCOUNTER — Encounter: Payer: Self-pay | Admitting: Internal Medicine

## 2021-02-24 ENCOUNTER — Other Ambulatory Visit: Payer: Self-pay | Admitting: Family Medicine

## 2021-02-24 ENCOUNTER — Ambulatory Visit: Payer: Medicare Other | Admitting: Internal Medicine

## 2021-02-24 DIAGNOSIS — I10 Essential (primary) hypertension: Secondary | ICD-10-CM

## 2021-02-24 DIAGNOSIS — R600 Localized edema: Secondary | ICD-10-CM

## 2021-02-24 NOTE — Telephone Encounter (Signed)
Please schedule an appt for pt to follow up with me for overactive bladder

## 2021-02-25 ENCOUNTER — Other Ambulatory Visit: Payer: Self-pay

## 2021-02-25 ENCOUNTER — Encounter: Payer: Self-pay | Admitting: Urology

## 2021-02-25 ENCOUNTER — Ambulatory Visit (INDEPENDENT_AMBULATORY_CARE_PROVIDER_SITE_OTHER): Payer: Medicare Other | Admitting: Urology

## 2021-02-25 VITALS — BP 119/74 | HR 63 | Ht 59.0 in | Wt 180.0 lb

## 2021-02-25 DIAGNOSIS — R102 Pelvic and perineal pain: Secondary | ICD-10-CM | POA: Diagnosis not present

## 2021-02-25 DIAGNOSIS — N3946 Mixed incontinence: Secondary | ICD-10-CM | POA: Diagnosis not present

## 2021-02-25 DIAGNOSIS — N3281 Overactive bladder: Secondary | ICD-10-CM

## 2021-02-25 LAB — BLADDER SCAN AMB NON-IMAGING

## 2021-02-25 NOTE — Patient Instructions (Signed)
Overactive Bladder, Adult  Overactive bladder is a condition in which a person has a sudden and frequent need to urinate. A person might also leak urine if he or she cannot get to the bathroom fast enough (urinary incontinence). Sometimes, symptoms can interfere with work or social activities. What are the causes? Overactive bladder is associated with poor nerve signals between your bladder and your brain. Your bladder may get the signal to empty before it is full. You may also have very sensitive muscles that make your bladder squeeze too soon. This condition may also be caused by other factors, such as:  Medical conditions: ? Urinary tract infection. ? Infection of nearby tissues. ? Prostate enlargement. ? Bladder stones, inflammation, or tumors. ? Diabetes. ? Muscle or nerve weakness, especially from these conditions:  A spinal cord injury.  Stroke.  Multiple sclerosis.  Parkinson's disease.  Other causes: ? Surgery on the uterus or urethra. ? Drinking too much caffeine or alcohol. ? Certain medicines, especially those that eliminate extra fluid in the body (diuretics). ? Constipation. What increases the risk? You may be at greater risk for overactive bladder if you:  Are an older adult.  Smoke.  Are going through menopause.  Have prostate problems.  Have a neurological disease, such as stroke, dementia, Parkinson's disease, or multiple sclerosis (MS).  Eat or drink alcohol, spicy food, caffeine, and other things that irritate the bladder.  Are overweight or obese. What are the signs or symptoms? Symptoms of this condition include a sudden, strong urge to urinate. Other symptoms include:  Leaking urine.  Urinating 8 or more times a day.  Waking up to urinate 2 or more times overnight. How is this diagnosed? This condition may be diagnosed based on:  Your symptoms and medical history.  A physical exam.  Blood or urine tests to check for possible causes,  such as infection. You may also need to see a health care provider who specializes in urinary tract problems. This is called a urologist. How is this treated? Treatment for overactive bladder depends on the cause of your condition and whether it is mild or severe. Treatment may include:  Bladder training, such as: ? Learning to control the urge to urinate by following a schedule to urinate at regular intervals. ? Doing Kegel exercises to strengthen the pelvic floor muscles that support your bladder.  Special devices, such as: ? Biofeedback. This uses sensors to help you become aware of your body's signals. ? Electrical stimulation. This uses electrodes placed inside the body (implanted) or outside the body. These electrodes send gentle pulses of electricity to strengthen the nerves or muscles that control the bladder. ? Women may use a plastic device, called a pessary, that fits into the vagina and supports the bladder.  Medicines, such as: ? Antibiotics to treat bladder infection. ? Antispasmodics to stop the bladder from releasing urine at the wrong time. ? Tricyclic antidepressants to relax bladder muscles. ? Injections of botulinum toxin type A directly into the bladder tissue to relax bladder muscles.  Surgery, such as: ? A device may be implanted to help manage the nerve signals that control urination. ? An electrode may be implanted to stimulate electrical signals in the bladder. ? A procedure may be done to change the shape of the bladder. This is done only in very severe cases. Follow these instructions at home: Eating and drinking  Make diet or lifestyle changes recommended by your health care provider. These may include: ? Drinking fluids   throughout the day and not only with meals. ? Cutting down on caffeine or alcohol. ? Eating a healthy and balanced diet to prevent constipation. This may include:  Choosing foods that are high in fiber, such as beans, whole grains, and  fresh fruits and vegetables.  Limiting foods that are high in fat and processed sugars, such as fried and sweet foods.   Lifestyle  Lose weight if needed.  Do not use any products that contain nicotine or tobacco. These include cigarettes, chewing tobacco, and vaping devices, such as e-cigarettes. If you need help quitting, ask your health care provider.   General instructions  Take over-the-counter and prescription medicines only as told by your health care provider.  If you were prescribed an antibiotic medicine, take it as told by your health care provider. Do not stop taking the antibiotic even if you start to feel better.  Use any implants or pessary as told by your health care provider.  If needed, wear pads to absorb urine leakage.  Keep a log to track how much and when you drink, and when you need to urinate. This will help your health care provider monitor your condition.  Keep all follow-up visits. This is important. Contact a health care provider if:  You have a fever or chills.  Your symptoms do not get better with treatment.  Your pain and discomfort get worse.  You have more frequent urges to urinate. Get help right away if:  You are not able to control your bladder. Summary  Overactive bladder refers to a condition in which a person has a sudden and frequent need to urinate.  Several conditions may lead to an overactive bladder.  Treatment for overactive bladder depends on the cause and severity of your condition.  Making lifestyle changes, doing Kegel exercises, keeping a log, and taking medicines can help with this condition. This information is not intended to replace advice given to you by your health care provider. Make sure you discuss any questions you have with your health care provider. Document Revised: 06/10/2020 Document Reviewed: 06/10/2020 Elsevier Patient Education  2021 Elsevier Inc.  

## 2021-02-25 NOTE — Progress Notes (Signed)
02/25/21 1:32 PM   Chelsea [name] 04/27/1946 [id number]  CC: Urinary symptoms, pelvic pressure, overactive bladder, stress incontinence  HPI: She is a frail and comorbid 75 year old female who presents today to discuss the above issues.  She reports a long history of overactive bladder symptoms with urgency, frequency, and urge incontinence.  She feels like over the last 6 months she is also had some worsening pelvic pain and pressure and lower abdominal discomfort associated with the urgency and urge incontinence.  Urinalyses have been benign with no microscopic hematuria, and no recent positive cultures in the last year.  She has never taken any medications for this.  She has a history of spinal stenosis and obesity.  She has a history of abdominal hysterectomy.  There is no prior cross-sectional imaging to review.  She denies any gross hematuria or dysuria.  She also has stress incontinence when moving around.  She has some frequency overnight but less leakage.  She drinks soda and coffee during the day.  Bladder scan today is 300 mL, but she has not voided for the last few hours.   PMH: Past Medical History:  Diagnosis Date  . Anxiety   . Arthritis   . Depression   . Hyperlipidemia   . Hypertension   . Seizures ([place])   . Spinal stenosis   . Ulcer   . Urinary incontinence     Surgical History: Past Surgical History:  Procedure Laterality Date  . ABDOMINAL HYSTERECTOMY    . BACK SURGERY     due to polio  . BREAST BIOPSY Bilateral    neg  . BREAST BIOPSY Right 2011   neg/stereo  . CARPAL TUNNEL RELEASE    . CATARACT EXTRACTION Right 2020    Family History: Family History  Problem Relation Age of Onset  . Arthritis Mother   . Heart disease Mother   . Stroke Mother   . Hypertension Mother   . COPD Mother   . Sudden death Sister   . Other Father        unknown medical history  . Breast cancer Neg Hx     Social History:  reports that she has never smoked.  She has never used smokeless tobacco. She reports that she does not drink alcohol and does not use drugs.  Physical Exam: BP 119/74   Pulse 63   Ht 4\' 11"  (1.499 m)   Wt 180 lb (81.6 kg)   BMI 36.36 kg/m    Constitutional: Frail-appearing, in wheelchair Cardiovascular: No clubbing, cyanosis, or edema. Respiratory: Normal respiratory effort, no increased work of breathing. GI: Abdomen is soft, nontender, nondistended, no abdominal masses   Laboratory Data: Reviewed  Pertinent Imaging: No prior cross-sectional imaging to review  Assessment & Plan:   75 year old comorbid female with long history of overactive bladder symptoms, new pelvic pressure and abdominal pain over the last 6 months.  No prior cross-sectional imaging to review.  Urinalyses have all been benign with no microscopic hematuria, no infections over the last year.  We discussed possible etiologies at length.  I recommended starting with an overactive bladder medication to see if we can improve some of her urgency, pressure, and urge incontinence.  I think we need to have realistic treatment goals with her comorbidities.  Trial of Myrbetriq 25 mg daily Consider further imaging based on response to Myrbetriq RTC 1 month with Dr. Matilde Sprang, PVR  Chelsea Madrid, MD 02/25/2021  [facility] [address], [place]  [place], [place] [address] [phone]

## 2021-02-26 ENCOUNTER — Other Ambulatory Visit: Payer: Self-pay

## 2021-02-26 DIAGNOSIS — E782 Mixed hyperlipidemia: Secondary | ICD-10-CM

## 2021-02-26 MED ORDER — ATORVASTATIN CALCIUM 40 MG PO TABS: 40.0000 mg | ORAL_TABLET | Freq: Every day | ORAL | 3 refills | Status: DC

## 2021-02-27 MED ORDER — FLUOXETINE HCL 20 MG PO CAPS: 60.0000 mg | ORAL_CAPSULE | Freq: Every morning | ORAL | 0 refills | Status: DC

## 2021-02-28 ENCOUNTER — Other Ambulatory Visit: Payer: Self-pay | Admitting: Internal Medicine

## 2021-02-28 ENCOUNTER — Other Ambulatory Visit: Payer: Self-pay

## 2021-02-28 DIAGNOSIS — R0602 Shortness of breath: Secondary | ICD-10-CM

## 2021-03-06 ENCOUNTER — Telehealth: Payer: Self-pay

## 2021-03-06 ENCOUNTER — Telehealth: Payer: Self-pay | Admitting: Internal Medicine

## 2021-03-06 NOTE — Telephone Encounter (Signed)
Patient would like the nurse or doctor to call regarding her Xanax medication.  She stated she is going to have surgery soon and would like the medication increased.  Please call to discuss

## 2021-03-06 NOTE — Telephone Encounter (Signed)
Copied from [place] [id number]. Topic: Quick Communication - Home Health Verbal Orders >> Mar 04, 2021  2:16 PM Tessa Lerner A wrote: Caller/Agency: [facility] / [place] Number: [phone]  Requesting OT/PT/Skilled Nursing/Social Work/Speech Therapy: PT  Frequency: 1w1 2w4 1w4   Verbal given for the patient to proceed with PT.

## 2021-03-06 NOTE — Telephone Encounter (Signed)
Pt called saying she would like to see the provider asap for anxiety.  CB#  [phone]

## 2021-03-07 ENCOUNTER — Ambulatory Visit (INDEPENDENT_AMBULATORY_CARE_PROVIDER_SITE_OTHER): Payer: Medicare Other

## 2021-03-07 ENCOUNTER — Other Ambulatory Visit: Payer: Self-pay

## 2021-03-07 ENCOUNTER — Telehealth: Payer: Self-pay

## 2021-03-07 DIAGNOSIS — R0602 Shortness of breath: Secondary | ICD-10-CM | POA: Diagnosis not present

## 2021-03-07 NOTE — Telephone Encounter (Signed)
Attempted to contact the patient. No answer. LMOM to return my call.

## 2021-03-07 NOTE — Telephone Encounter (Signed)
Patient called in to return call to [facility], I called over to the office and was unable to get anyone on the phone. Pt requested someone give her a call back. Please advise

## 2021-03-07 NOTE — Telephone Encounter (Signed)
She needs to schedule an OV to discuss this

## 2021-03-07 NOTE — Telephone Encounter (Signed)
I am not agreeable with this. We should try something like Wellbutrin or Buspar in addition to her Fluoxetine. Recommend she schedule an appt to discuss

## 2021-03-07 NOTE — Telephone Encounter (Signed)
I spoke with the patient and she is requesting that you increase her Alprazolam from BID to TID. She said she is extremely anxious because of her recent lifestyle changes and health conditions. She recently had to move out of her apartment in with someone because of the recurrent falls. She said she is also anxious because of her upcoming surgery. Please advise

## 2021-03-10 NOTE — Telephone Encounter (Signed)
I scheduled an appt for the patient on Thursday, June 9th at 3:20Pm to discuss her increase anxiety.

## 2021-03-13 ENCOUNTER — Ambulatory Visit (INDEPENDENT_AMBULATORY_CARE_PROVIDER_SITE_OTHER): Payer: Medicare Other | Admitting: Internal Medicine

## 2021-03-13 ENCOUNTER — Encounter: Payer: Self-pay | Admitting: Internal Medicine

## 2021-03-13 ENCOUNTER — Other Ambulatory Visit: Payer: Self-pay

## 2021-03-13 VITALS — BP 129/58 | HR 70 | Temp 97.5°F | Resp 17 | Ht 59.0 in | Wt 184.0 lb

## 2021-03-13 DIAGNOSIS — F3341 Major depressive disorder, recurrent, in partial remission: Secondary | ICD-10-CM | POA: Diagnosis not present

## 2021-03-13 DIAGNOSIS — G959 Disease of spinal cord, unspecified: Secondary | ICD-10-CM

## 2021-03-13 DIAGNOSIS — F411 Generalized anxiety disorder: Secondary | ICD-10-CM | POA: Diagnosis not present

## 2021-03-13 DIAGNOSIS — Z6837 Body mass index (BMI) 37.0-37.9, adult: Secondary | ICD-10-CM

## 2021-03-13 MED ORDER — BUSPIRONE HCL 5 MG PO TABS: 5.0000 mg | ORAL_TABLET | Freq: Three times a day (TID) | ORAL | 2 refills | Status: DC

## 2021-03-13 NOTE — Patient Instructions (Signed)
Buspirone tablets What is this medicine? BUSPIRONE (byoo SPYE rone) is used to treat anxiety disorders. This medicine may be used for other purposes; ask your health care provider or pharmacist if you have questions. COMMON BRAND NAME(S): BuSpar What should I tell my health care provider before I take this medicine? They need to know if you have any of these conditions: kidney or liver disease an unusual or allergic reaction to buspirone, other medicines, foods, dyes, or preservatives pregnant or trying to get pregnant breast-feeding How should I use this medicine? Take this medicine by mouth with a glass of water. Follow the directions on the prescription label. You may take this medicine with or without food. To ensure that this medicine always works the same way for you, you should take it either always with or always without food. Take your doses at regular intervals. Do not take your medicine more often than directed. Do not stop taking except on the advice of your doctor or health care professional. Talk to your pediatrician regarding the use of this medicine in children. Special care may be needed. Overdosage: If you think you have taken too much of this medicine contact a poison control center or emergency room at once. NOTE: This medicine is only for you. Do not share this medicine with others. What if I miss a dose? If you miss a dose, take it as soon as you can. If it is almost time for your next dose, take only that dose. Do not take double or extra doses. What may interact with this medicine? Do not take this medicine with any of the following medications: linezolid MAOIs like Carbex, Eldepryl, Marplan, Nardil, and Parnate methylene blue procarbazine This medicine may also interact with the following medications: diazepam digoxin diltiazem erythromycin grapefruit juice haloperidol medicines for mental depression or mood problems medicines for seizures like carbamazepine,  phenobarbital and phenytoin nefazodone other medications for anxiety rifampin ritonavir some antifungal medicines like itraconazole, ketoconazole, and voriconazole verapamil warfarin This list may not describe all possible interactions. Give your health care provider a list of all the medicines, herbs, non-prescription drugs, or dietary supplements you use. Also tell them if you smoke, drink alcohol, or use illegal drugs. Some items may interact with your medicine. What should I watch for while using this medicine? Visit your doctor or health care professional for regular checks on your progress. It may take 1 to 2 weeks before your anxiety gets better. You may get drowsy or dizzy. Do not drive, use machinery, or do anything that needs mental alertness until you know how this drug affects you. Do not stand or sit up quickly, especially if you are an older patient. This reduces the risk of dizzy or fainting spells. Alcohol can make you more drowsy and dizzy. Avoid alcoholic drinks. What side effects may I notice from receiving this medicine? Side effects that you should report to your doctor or health care professional as soon as possible: blurred vision or other vision changes chest pain confusion difficulty breathing feelings of hostility or anger muscle aches and pains numbness or tingling in hands or feet ringing in the ears skin rash and itching vomiting weakness Side effects that usually do not require medical attention (report to your doctor or health care professional if they continue or are bothersome): disturbed dreams, nightmares headache nausea restlessness or nervousness sore throat and nasal congestion stomach upset This list may not describe all possible side effects. Call your doctor for medical advice about side  effects. You may report side effects to FDA at 1-800-FDA-1088. Where should I keep my medicine? Keep out of the reach of children. Store at room temperature  below 30 degrees C (86 degrees F). Protect from light. Keep container tightly closed. Throw away any unused medicine after the expiration date. NOTE: This sheet is a summary. It may not cover all possible information. If you have questions about this medicine, talk to your doctor, pharmacist, or health care provider.  2021 Elsevier/Gold Standard (2010-05-01 18:06:11)

## 2021-03-13 NOTE — Progress Notes (Signed)
Subjective:    Patient ID: Chelsea [name], female    DOB: March 13, 1946, 75 y.o.   MRN: [id number]  HPI  Patient presents the clinic today for follow-up of anxiety.  She reports this has increased as of late because she has had to move out of her own apartment because she is no longer to take care of her self.  She is now having to live with a caregiver out of 1 bedroom space.  She also reports she has an upcoming surgery for cervical spine stenosis and this is making her very anxious.  She is prescribed Fluoxetine but reports she only takes this as needed.  She is taking Xanax twice daily as scheduled.  She would like this increased to 3 times daily.  She is not currently seeing a therapist.  She denies depression at this time, SI/HI.  Review of Systems     Past Medical History:  Diagnosis Date   Anxiety    Arthritis    Depression    Hyperlipidemia    Hypertension    Seizures ([place])    Spinal stenosis    Ulcer    Urinary incontinence     Current Outpatient Medications  Medication Sig Dispense Refill   acetaminophen (TYLENOL) 500 MG tablet Take 500 mg by mouth every 6 (six) hours as needed.     ALPRAZolam (XANAX) 0.5 MG tablet Take 1 tablet (0.5 mg total) by mouth 2 (two) times daily as needed for anxiety. 60 tablet 2   aspirin 325 MG tablet Take 325 mg by mouth daily.     atorvastatin (LIPITOR) 40 MG tablet Take 1 tablet (40 mg total) by mouth daily. 90 tablet 3   Cyanocobalamin (VITAMIN B 12 PO) Take 500 mcg by mouth daily.     diclofenac Sodium (VOLTAREN) 1 % GEL Apply 4 g topically 4 (four) times daily. 4 g 0   FLUoxetine (PROZAC) 20 MG capsule Take 3 capsules (60 mg total) by mouth every morning. 180 capsule 0   gabapentin (NEURONTIN) 100 MG capsule Take 2 capsules (200 mg total) by mouth 2 (two) times daily. 360 capsule 1   lidocaine (LIDODERM) 5 % Place 1 patch onto the skin daily. Remove & Discard patch within 12 hours or as directed by MD 1 patch 0   polyethylene glycol  powder (GLYCOLAX/MIRALAX) 17 GM/SCOOP powder Take 17 g by mouth daily. 3350 g 1   triamterene-hydrochlorothiazide (MAXZIDE-25) 37.5-25 MG tablet TAKE 1 TABLET BY MOUTH EVERY DAY 90 tablet 1   No current facility-administered medications for this visit.    Allergies  Allergen Reactions   Penicillins     "Everything turned black"    Family History  Problem Relation Age of Onset   Arthritis Mother    Heart disease Mother    Stroke Mother    Hypertension Mother    COPD Mother    Sudden death Sister    Other Father        unknown medical history   Breast cancer Neg Hx     Social History   Socioeconomic History   Marital status: Divorced    Spouse name: Not on file   Number of children: Not on file   Years of education: Not on file   Highest education level: Not on file  Occupational History   Not on file  Tobacco Use   Smoking status: Never   Smokeless tobacco: Never  Vaping Use   Vaping Use: Never used  Substance and  Sexual Activity   Alcohol use: Never   Drug use: No   Sexual activity: Not on file  Other Topics Concern   Not on file  Social History Narrative   Not on file   Social Determinants of Health   Financial Resource Strain: Not on file  Food Insecurity: Not on file  Transportation Needs: Not on file  Physical Activity: Not on file  Stress: Not on file  Social Connections: Not on file  Intimate Partner Violence: Not on file     Constitutional: Denies fever, malaise, fatigue, headache or abrupt weight changes.  Respiratory: Denies difficulty breathing, shortness of breath, cough or sputum production.   Cardiovascular: Denies chest pain, chest tightness, palpitations or swelling in the hands or feet.  Psych: Pt reports anxiety. Denies depression, SI/HI.  No other specific complaints in a complete review of systems (except as listed in HPI above).  Objective:   Physical Exam BP (!) 129/58 (BP Location: Left Arm, Patient Position: Sitting, Cuff  Size: Large)   Pulse 70   Temp (!) 97.5 F (36.4 C) (Temporal)   Resp 17   Ht 4\' 11"  (1.499 m)   Wt 83.5 kg   SpO2 98%   BMI 37.16 kg/m   Wt Readings from Last 3 Encounters:  02/25/21 180 lb (81.6 kg)  02/12/21 180 lb (81.6 kg)  02/07/21 186 lb (84.4 kg)    General: Appears her stated age, obese, chronically ill, in NAD. HEENT: Head: normal shape and size; Eyes: sclera white and EOMs intact;  Cardiovascular: Normal rate and rhythm. S1,S2 noted.  No murmur, rubs or gallops noted.  Pulmonary/Chest: Normal effort and positive vesicular breath sounds. No respiratory distress. No wheezes, rales or ronchi noted.  Neurological: Alert and oriented.  Psychiatric: Mood and affect normal. Behavior is normal. Judgment and thought content normal.     BMET    Component Value Date/Time   NA 139 02/12/2021 0209   K 3.9 02/12/2021 0209   CL 99 02/12/2021 0209   CO2 33 (H) 02/12/2021 0209   GLUCOSE 104 (H) 02/12/2021 0209   BUN 19 02/12/2021 0209   CREATININE 0.93 02/12/2021 0209   CREATININE 0.87 08/13/2020 1129   CALCIUM 9.6 02/12/2021 0209   GFRNONAA >60 02/12/2021 0209   GFRNONAA 65 08/13/2020 1129   GFRAA 76 08/13/2020 1129    Lipid Panel     Component Value Date/Time   CHOL 238 (H) 02/26/2020 1130   TRIG 84 02/26/2020 1130   HDL 60 02/26/2020 1130   CHOLHDL 4.0 02/26/2020 1130   VLDL 18.4 02/08/2013 1638   LDLCALC 159 (H) 02/26/2020 1130    CBC    Component Value Date/Time   WBC 5.1 02/12/2021 0209   RBC 3.69 (L) 02/12/2021 0209   HGB 10.9 (L) 02/12/2021 0209   HCT 35.3 (L) 02/12/2021 0209   PLT 222 02/12/2021 0209   MCV 95.7 02/12/2021 0209   MCH 29.5 02/12/2021 0209   MCHC 30.9 02/12/2021 0209   RDW 14.4 02/12/2021 0209   LYMPHSABS 2.5 02/12/2021 0209   MONOABS 0.6 02/12/2021 0209   EOSABS 0.2 02/12/2021 0209   BASOSABS 0.0 02/12/2021 0209    Hgb A1C No results found for: HGBA1C        Assessment & Plan:     Webb Silversmith, NP This visit  occurred during the SARS-CoV-2 public health emergency.  Safety protocols were in place, including screening questions prior to the visit, additional usage of staff PPE, and extensive cleaning of  exam room while observing appropriate contact time as indicated for disinfecting solutions.

## 2021-03-14 ENCOUNTER — Telehealth: Payer: Self-pay | Admitting: Internal Medicine

## 2021-03-14 DIAGNOSIS — Z1211 Encounter for screening for malignant neoplasm of colon: Secondary | ICD-10-CM

## 2021-03-14 NOTE — Telephone Encounter (Signed)
Pt is calling to ask [facility] advice regarding her cologuard. Please advise CB- [phone]

## 2021-03-15 NOTE — Addendum Note (Signed)
Addended by: Jearld Fenton on: 03/15/2021 08:26 AM   Modules accepted: Orders

## 2021-03-15 NOTE — Telephone Encounter (Signed)
Cologuard ordered, will fax monday

## 2021-03-16 ENCOUNTER — Encounter: Payer: Self-pay | Admitting: Internal Medicine

## 2021-03-16 DIAGNOSIS — E6609 Other obesity due to excess calories: Secondary | ICD-10-CM | POA: Insufficient documentation

## 2021-03-16 NOTE — Assessment & Plan Note (Signed)
Anxiety re: upcoming surgery Support offered

## 2021-03-16 NOTE — Assessment & Plan Note (Signed)
She reports no issues with depression at this time

## 2021-03-16 NOTE — Assessment & Plan Note (Signed)
Advised patient that I was not comfortable increasing her Xanax to 3 times daily especially when she is not taking her SSRI as prescribed Advised her to take Fluoxetine daily as prescribed Continue Xanax twice daily Rx for BuSpar 5 mg 3 times daily as needed Encourage referral to psychology for CBT therapy but she declines at this time

## 2021-03-16 NOTE — Assessment & Plan Note (Signed)
Encouraged calorie restricted diet, exercise for weight loss

## 2021-03-17 LAB — ECHOCARDIOGRAM COMPLETE
AR max vel: 2.14 cm2
AV Area VTI: 2.04 cm2
AV Area mean vel: 2.08 cm2
AV Mean grad: 4 mmHg
AV Peak grad: 8.6 mmHg
Ao pk vel: 1.47 m/s
Area-P 1/2: 2.81 cm2
Calc EF: 56.7 %
S' Lateral: 2.9 cm
Single Plane A2C EF: 54.5 %
Single Plane A4C EF: 58.7 %

## 2021-03-18 ENCOUNTER — Telehealth: Payer: Self-pay

## 2021-03-18 NOTE — Telephone Encounter (Signed)
Copied from [place] [phone]. Topic: General - Other >> Mar 18, 2021 11:16 AM Mcneil, Ja-Kwan wrote: Reason for CRM: Pt stated she has not received the cologuard kit and she is suppose to complete it before her surgery. Pt requests that Bary Castilla return her call.   I called the patient and left a detail message on the patient vm with [employer] phone number.

## 2021-03-19 NOTE — Telephone Encounter (Signed)
Pt had some follow up questions regarding the message left, please advise.

## 2021-03-20 NOTE — Telephone Encounter (Signed)
Copied from [place] [phone]. Topic: General - Other >> Mar 19, 2021  2:44 PM Yvette Rack wrote: Reason for CRM: Pt requests return call from [place]. Cb# [phone]

## 2021-03-20 NOTE — Telephone Encounter (Signed)
Pls fu with pt again about Cologuard, does not understand message left. [phone]

## 2021-03-21 ENCOUNTER — Telehealth: Payer: Self-pay

## 2021-03-21 NOTE — Telephone Encounter (Signed)
Copied from [place] [phone]. Topic: General - Other >> Mar 21, 2021 10:59 AM Leward Quan A wrote: Reason for CRM: Amy with [facility] called in to inform Webb Silversmith that patient complained of increased bladder pain and increased urination. She went ahead and collected a urine specimen and need permission from [facility] to do a stat urinalysis. Say urine color was white like milk and very bad odor. Please call Amy at Ph#  [phone] or [phone] ask for [place]

## 2021-03-21 NOTE — Telephone Encounter (Signed)
OK for urinalysis and culture

## 2021-03-23 ENCOUNTER — Emergency Department
Admission: EM | Admit: 2021-03-23 | Discharge: 2021-03-24 | Disposition: A | Discharge status: Home / Self Care | Payer: Medicare Other | Attending: Emergency Medicine | Admitting: Emergency Medicine

## 2021-03-23 ENCOUNTER — Emergency Department: Payer: Medicare Other

## 2021-03-23 ENCOUNTER — Other Ambulatory Visit: Payer: Self-pay

## 2021-03-23 DIAGNOSIS — K859 Acute pancreatitis without necrosis or infection, unspecified: Secondary | ICD-10-CM | POA: Insufficient documentation

## 2021-03-23 DIAGNOSIS — I10 Essential (primary) hypertension: Secondary | ICD-10-CM | POA: Insufficient documentation

## 2021-03-23 DIAGNOSIS — R1909 Other intra-abdominal and pelvic swelling, mass and lump: Secondary | ICD-10-CM | POA: Diagnosis not present

## 2021-03-23 DIAGNOSIS — K8020 Calculus of gallbladder without cholecystitis without obstruction: Secondary | ICD-10-CM | POA: Diagnosis not present

## 2021-03-23 DIAGNOSIS — R1900 Intra-abdominal and pelvic swelling, mass and lump, unspecified site: Secondary | ICD-10-CM

## 2021-03-23 DIAGNOSIS — R1031 Right lower quadrant pain: Secondary | ICD-10-CM | POA: Diagnosis present

## 2021-03-23 DIAGNOSIS — Z7982 Long term (current) use of aspirin: Secondary | ICD-10-CM | POA: Insufficient documentation

## 2021-03-23 DIAGNOSIS — N3000 Acute cystitis without hematuria: Secondary | ICD-10-CM

## 2021-03-23 LAB — CBC
HCT: 36.6 % (ref 36.0–46.0)
Hemoglobin: 11.7 g/dL — ABNORMAL LOW (ref 12.0–15.0)
MCH: 29.7 pg (ref 26.0–34.0)
MCHC: 32 g/dL (ref 30.0–36.0)
MCV: 92.9 fL (ref 80.0–100.0)
Platelets: 237 10*3/uL (ref 150–400)
RBC: 3.94 MIL/uL (ref 3.87–5.11)
RDW: 13.8 % (ref 11.5–15.5)
WBC: 4.8 10*3/uL (ref 4.0–10.5)
nRBC: 0 % (ref 0.0–0.2)

## 2021-03-23 LAB — LIPASE, BLOOD: Lipase: 78 U/L — ABNORMAL HIGH (ref 11–51)

## 2021-03-23 LAB — COMPREHENSIVE METABOLIC PANEL
ALT: 12 U/L (ref 0–44)
AST: 15 U/L (ref 15–41)
Albumin: 3.8 g/dL (ref 3.5–5.0)
Alkaline Phosphatase: 69 U/L (ref 38–126)
Anion gap: 8 (ref 5–15)
BUN: 19 mg/dL (ref 8–23)
CO2: 28 mmol/L (ref 22–32)
Calcium: 9.5 mg/dL (ref 8.9–10.3)
Chloride: 101 mmol/L (ref 98–111)
Creatinine, Ser: 0.99 mg/dL (ref 0.44–1.00)
GFR, Estimated: 59 mL/min — ABNORMAL LOW (ref >60)
Glucose, Bld: 103 mg/dL — ABNORMAL HIGH (ref 70–99)
Potassium: 3.8 mmol/L (ref 3.5–5.1)
Sodium: 137 mmol/L (ref 135–145)
Total Bilirubin: 0.7 mg/dL (ref 0.3–1.2)
Total Protein: 7.9 g/dL (ref 6.5–8.1)

## 2021-03-23 IMAGING — US US ABDOMEN LIMITED
1 series · 14 of 25 positions shown · non-contrast
Comparison: None.

CLINICAL DATA: Pancreatitis.

EXAM:
ULTRASOUND ABDOMEN LIMITED RIGHT UPPER QUADRANT

[Series 1: us abdomen limited ruq (liver/gb) · 14 of 25 slices shown]
[im 1/25]
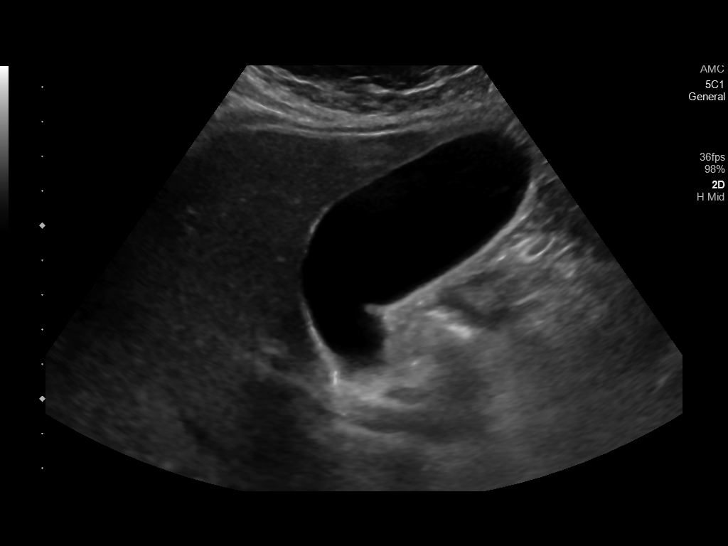
[im 3/25]
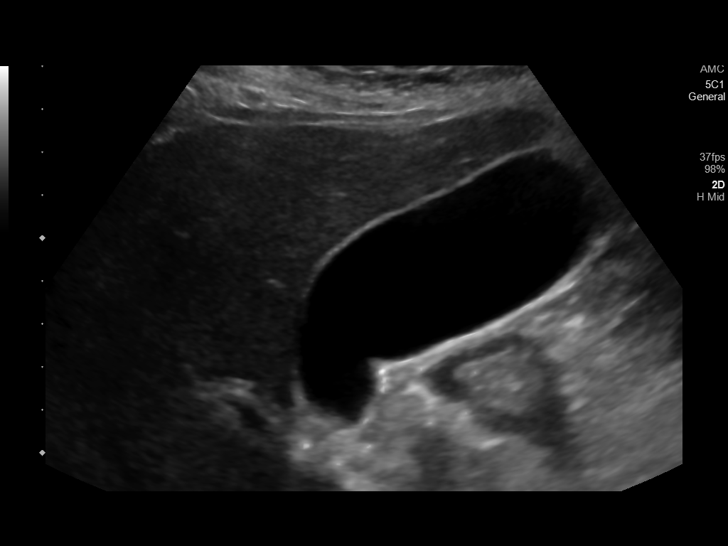
[im 5/25]
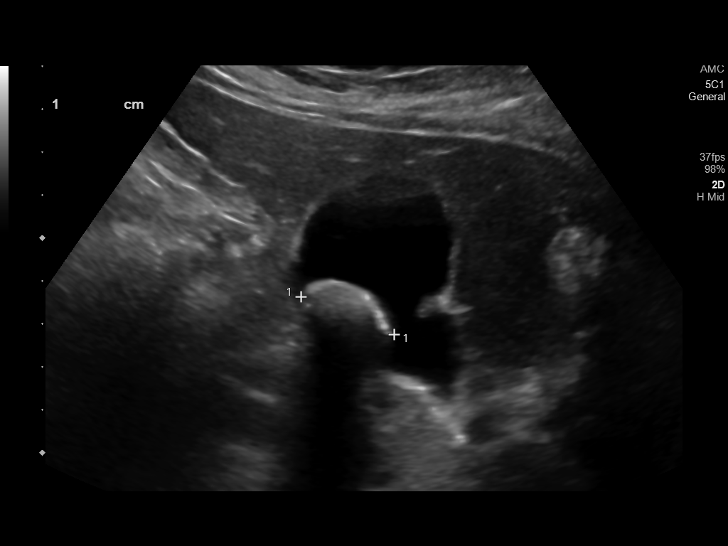
[im 7/25]
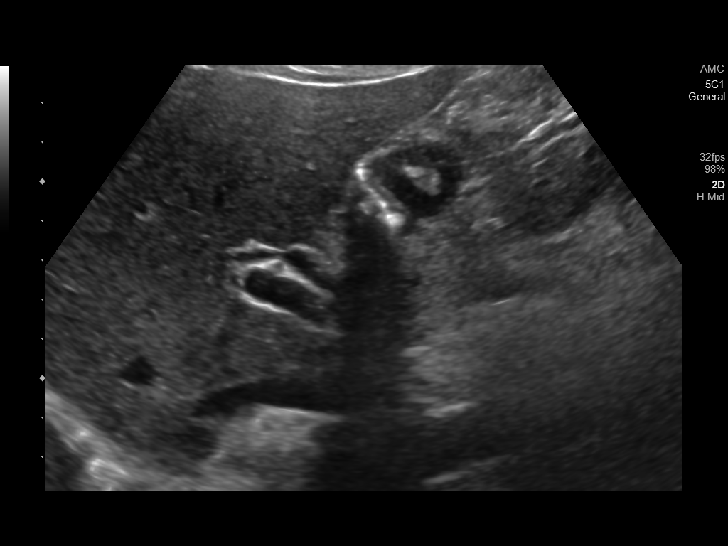
[im 9/25]
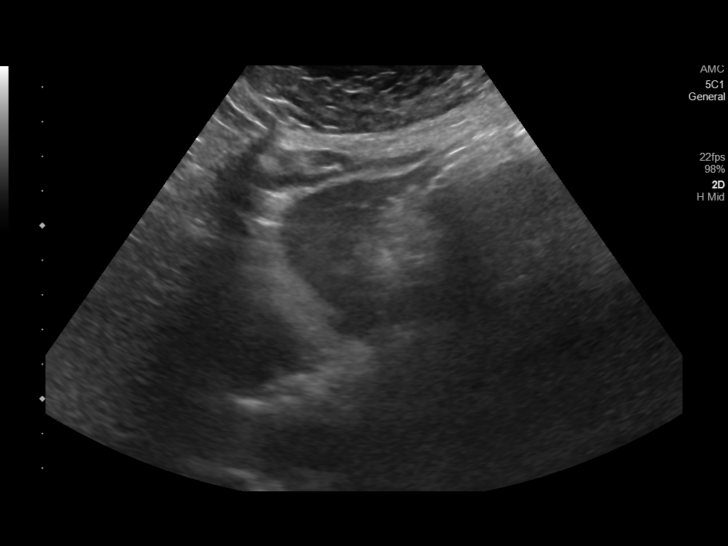
[im 10/25]
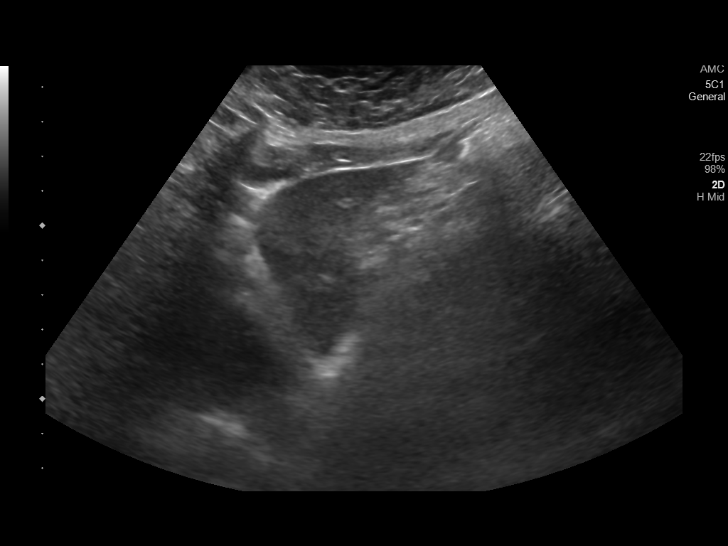
[im 12/25]
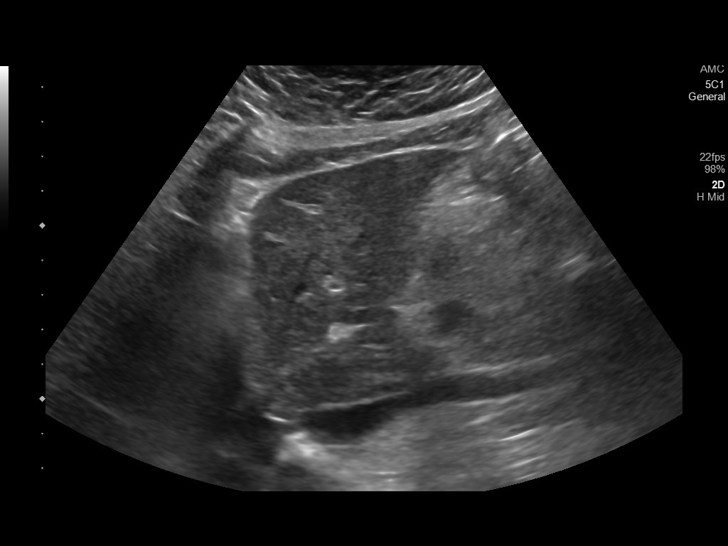
[im 14/25]
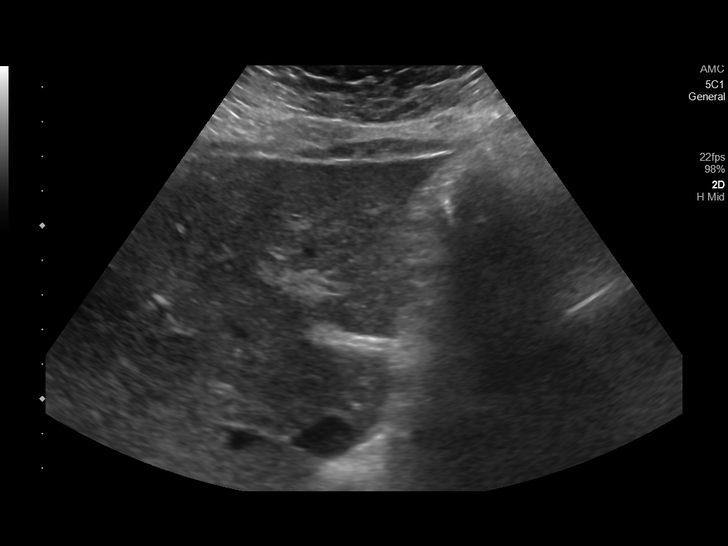
[im 16/25]
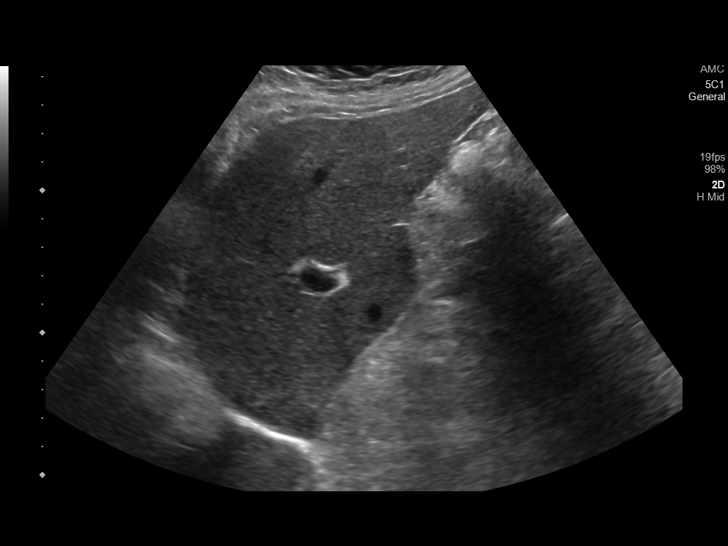
[im 17/25]
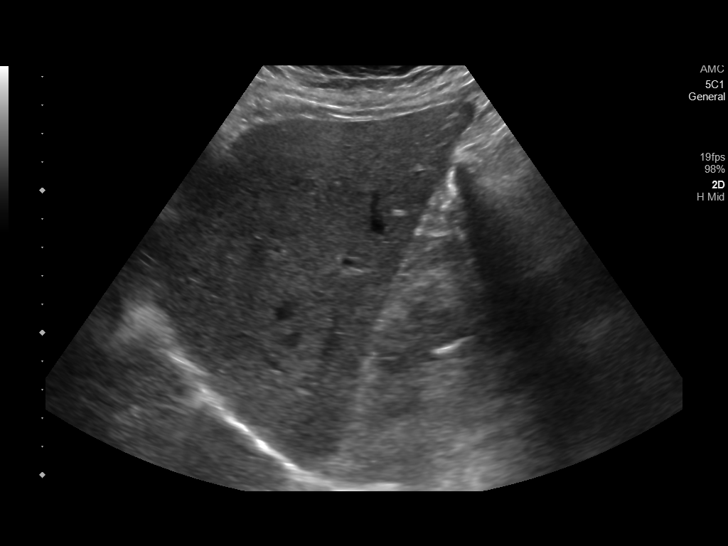
[im 19/25]
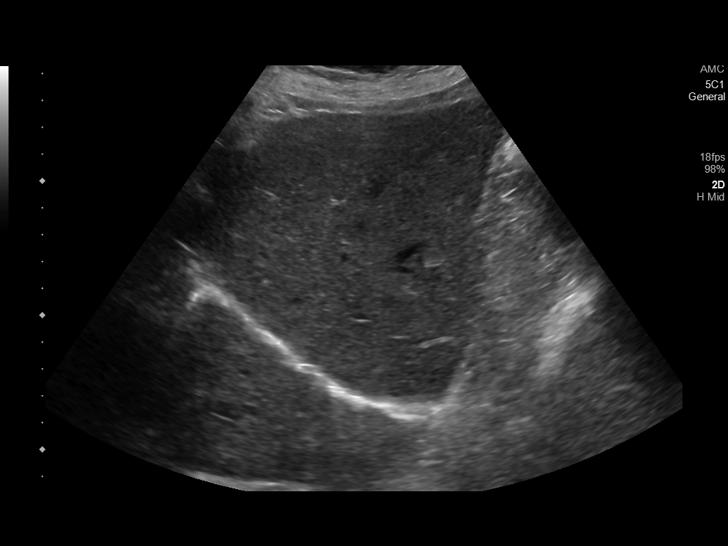
[im 21/25]
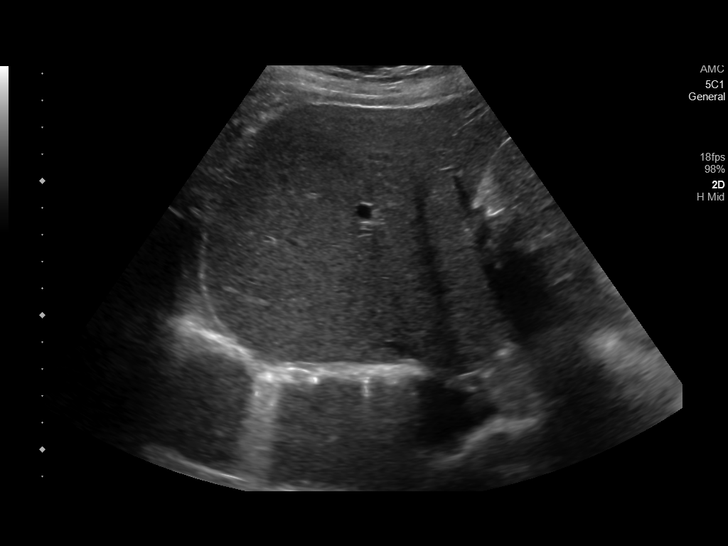
[im 23/25]
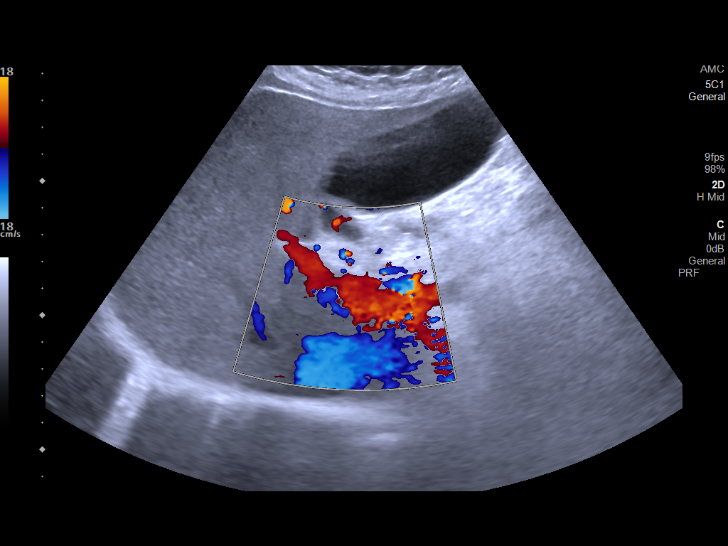
[im 25/25]
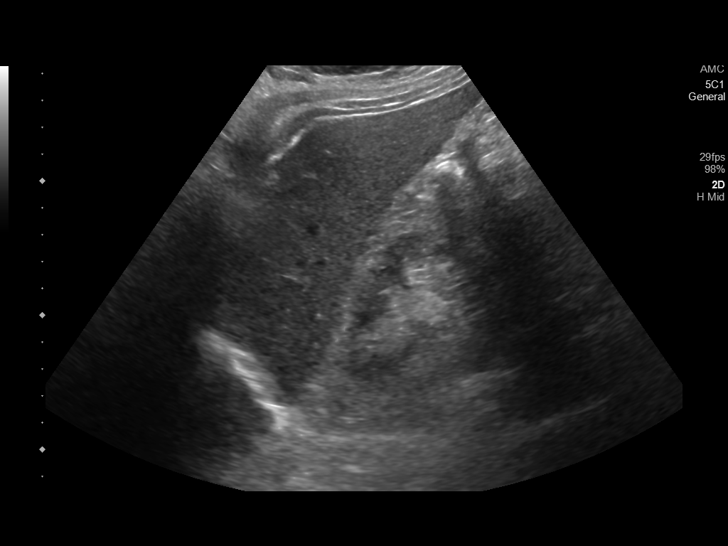

[14 of 25 positions shown; findings below may reference images not displayed]

FINDINGS: Gallbladder:

Single calcified gallstone noted within the gallbladder lumen
measuring up to 2.3 cm. Wall thickening or pericholecystic fluid
visualized. No sonographic Murphy sign noted by sonographer.

Common bile duct:

Diameter: 5 mm.

Liver:

No focal lesion identified. Within normal limits in parenchymal
echogenicity. Portal vein is patent on color Doppler imaging with
normal direction of blood flow towards the liver.

Other: None.
IMPRESSION: Cholelithiasis with no acute cholecystitis or choledocholithiasis.

## 2021-03-23 MED ORDER — ACETAMINOPHEN 500 MG PO TABS
1000.0000 mg | ORAL_TABLET | Freq: Once | ORAL | Status: AC
  Administered 2021-03-24: 1000 mg via ORAL
  Filled 2021-03-23: qty 2

## 2021-03-23 NOTE — ED Notes (Signed)
Family updated on wait. Additional warm blankets provided to pt.

## 2021-03-23 NOTE — ED Triage Notes (Signed)
Intermittent abd cramping, decreased dark urine, LBM 1wk ago ('a little came out 3days ago'). Neck surgery at [facility] scheduled next month

## 2021-03-24 ENCOUNTER — Emergency Department: Payer: Medicare Other

## 2021-03-24 LAB — URINALYSIS, COMPLETE (UACMP) WITH MICROSCOPIC
Bilirubin Urine: NEGATIVE
Glucose, UA: NEGATIVE mg/dL
Ketones, ur: NEGATIVE mg/dL
Nitrite: NEGATIVE
Protein, ur: 100 mg/dL — AB
Specific Gravity, Urine: 1.015 (ref 1.005–1.030)
WBC, UA: >50 WBC/hpf — ABNORMAL HIGH (ref 0–5)
pH: 6 (ref 5.0–8.0)

## 2021-03-24 IMAGING — CT CT PELVIS W/ CM
2 of 3 series · 16 of 46 positions shown, 18 images · IV contrast (APPLIED)
Comparison: None.

CLINICAL DATA: Abdominal pain

EXAM:
CT PELVIS WITH CONTRAST
TECHNIQUE: Multidetector CT imaging of the pelvis was performed using the
standard protocol following the bolus administration of intravenous
contrast.
CONTRAST:  100mL OMNIPAQUE IOHEXOL 300 MG/ML  SOLN

[Series 3: routine abd/pel with · axial · 0.95mm/px · z∈[-1172,-902]mm · 13 of 63 slices shown, 15 images]
[im 5/63  soft-tissue]
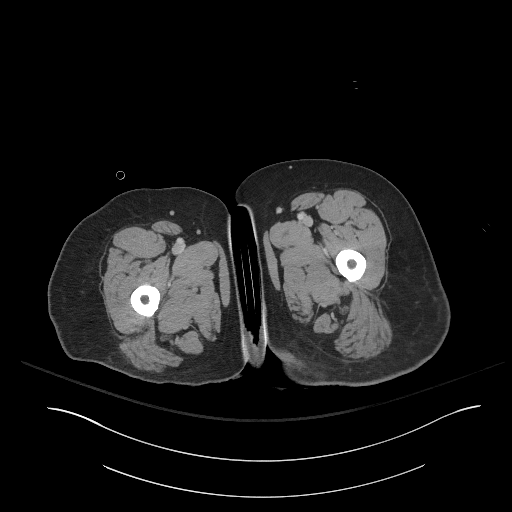
[im 5/63  bone]
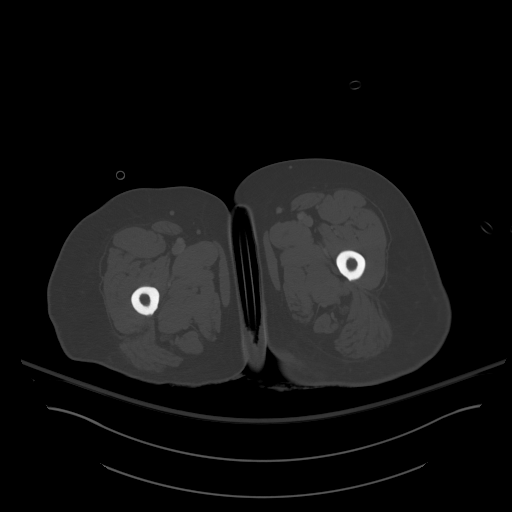
[im 9/63  soft-tissue]
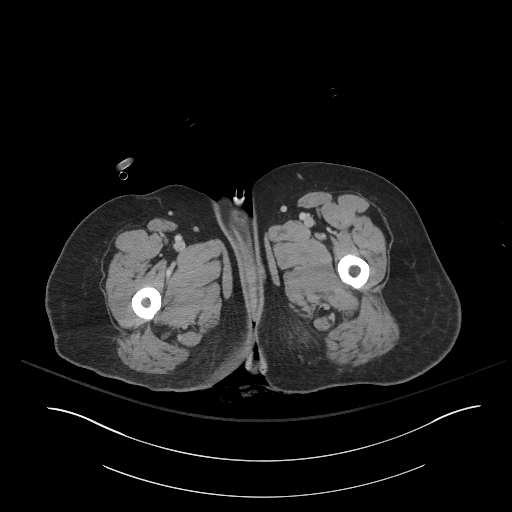
[im 13/63  soft-tissue]
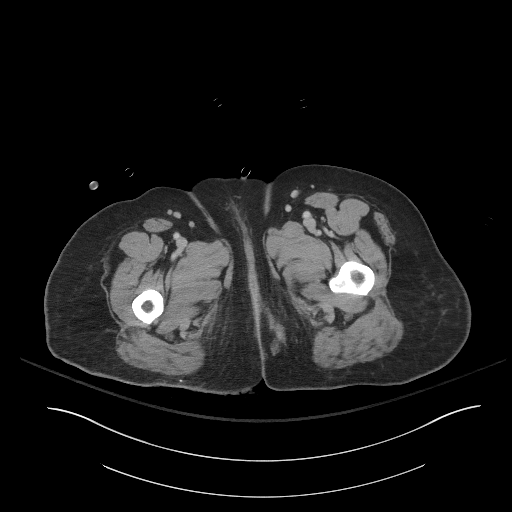
[im 19/63  soft-tissue]
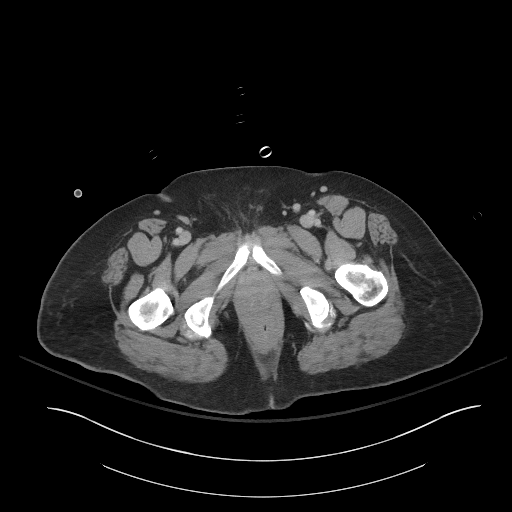
[im 23/63  soft-tissue]
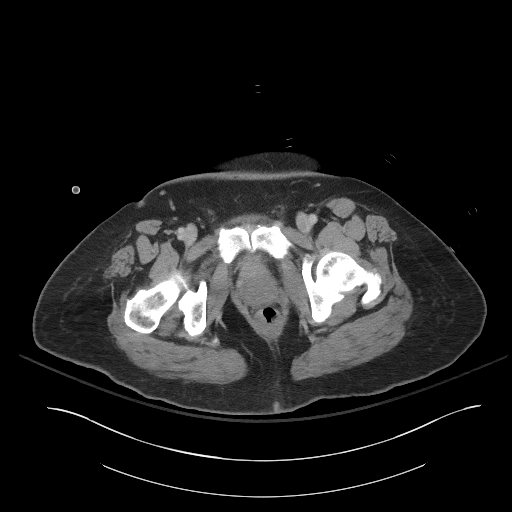
[im 27/63  soft-tissue]
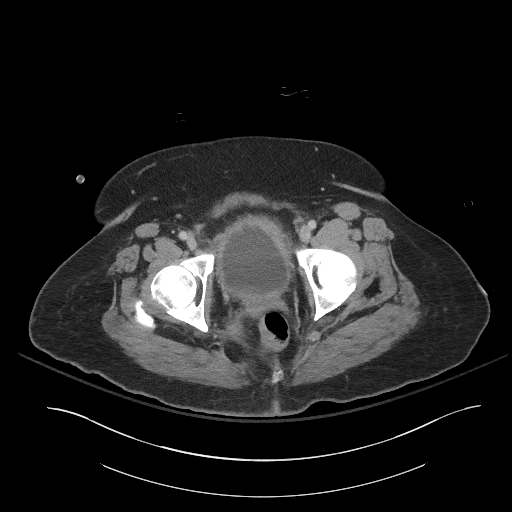
[im 33/63  soft-tissue]
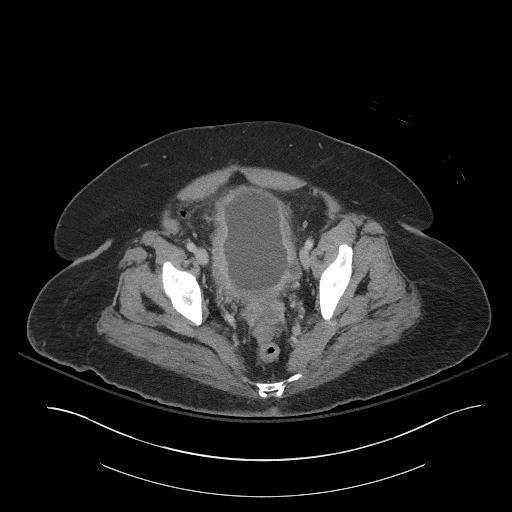
[im 37/63  soft-tissue]
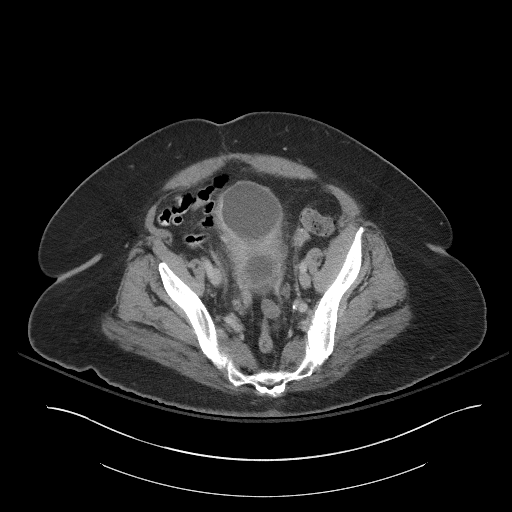
[im 41/63  soft-tissue]
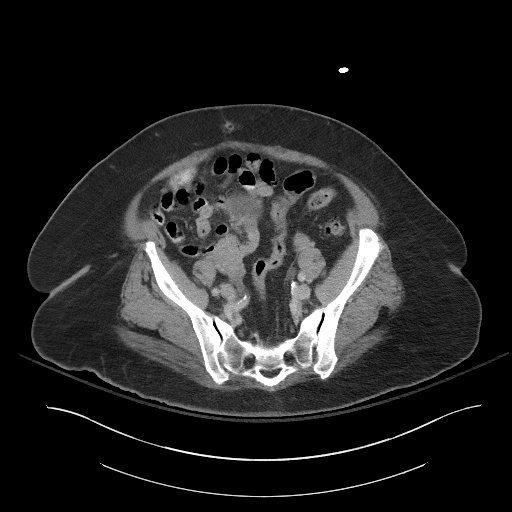
[im 41/63  bone]
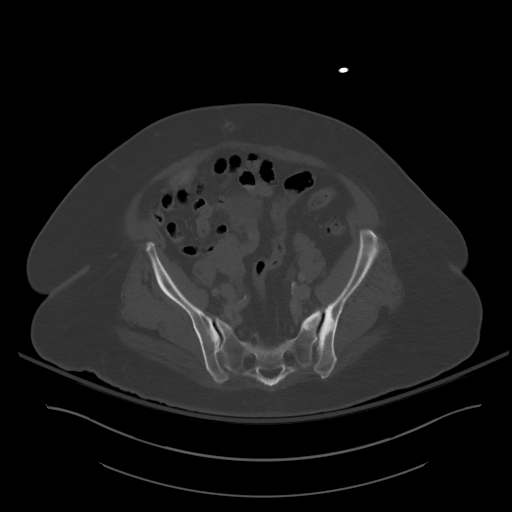
[im 45/63  soft-tissue]
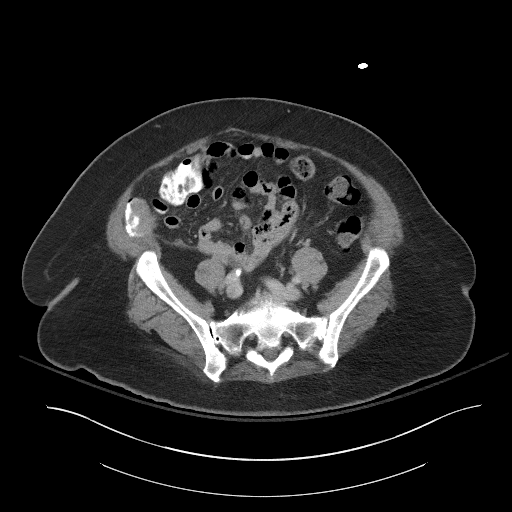
[im 51/63  soft-tissue]
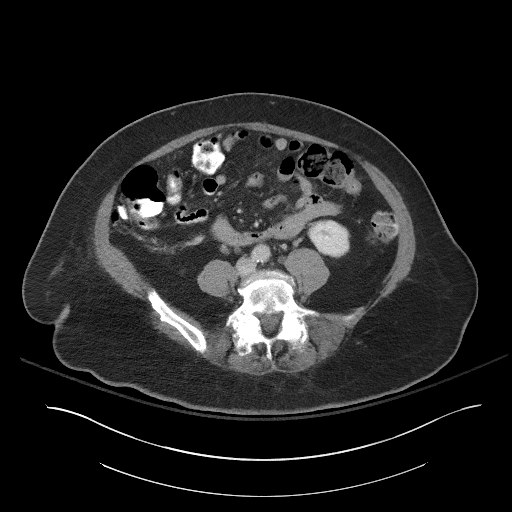
[im 55/63  soft-tissue]
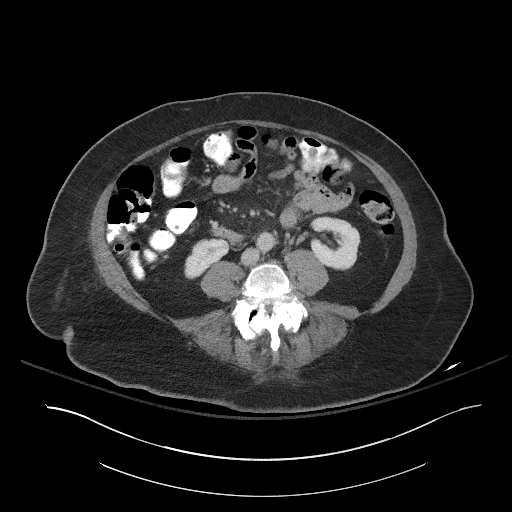
[im 59/63  soft-tissue]
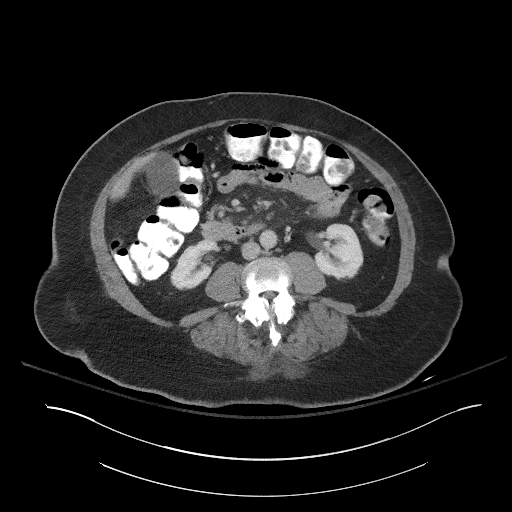

[Series 5: coronal st · coronal · 0.67mm/px · 3 of 107 slices shown]
[im 36/107  soft-tissue]
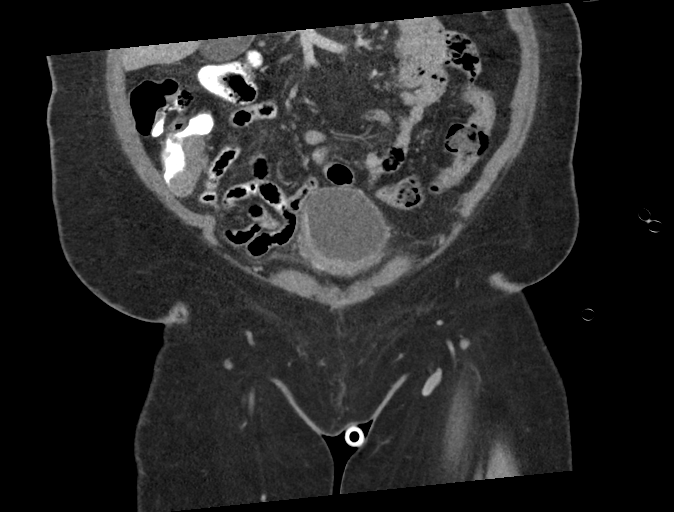
[im 48/107  soft-tissue]
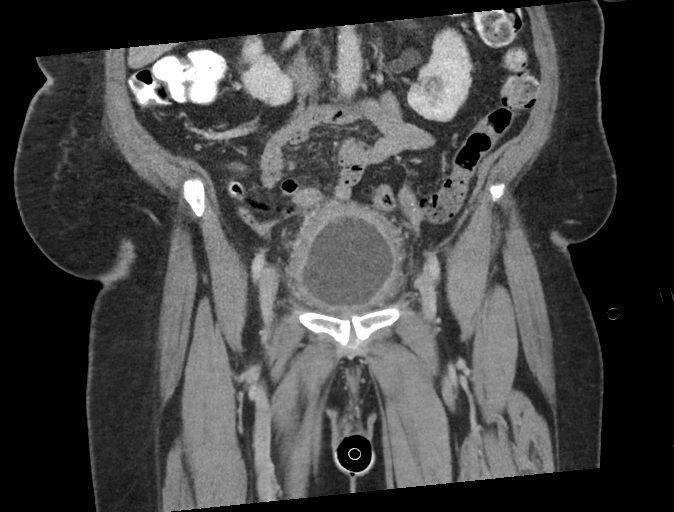
[im 59/107  soft-tissue]
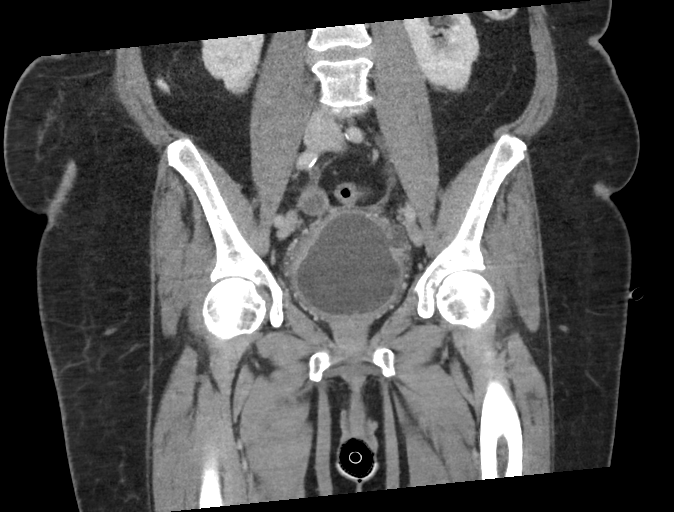

[16 of 46 positions shown; findings below may reference images not displayed]

FINDINGS: Urinary Tract: There is diffuse urinary bladder wall thickening.
Mild bilateral hydroureter.

Bowel: There is abnormal eccentric soft tissue of the right cecum
([DATE]).

Vascular/Lymphatic: Calcific aortic atherosclerosis.

Reproductive: Low attenuation mass in the region of the vaginal cuff
is again noted.

Other:  None

Musculoskeletal: Facet arthrosis.
IMPRESSION: 1. Abnormal eccentric soft tissue of the right cecum, concerning for
colon carcinoma. Colonoscopy recommended.
2. Low attenuation mass in the region of the vaginal cuff is again
noted. Unfortunately, the intravenous contrast is not particularly
helpful in this case. Pelvic ultrasound or pelvic MRI with and
without contrast may provide more information.
3. Diffuse urinary bladder wall thickening with mild bilateral
hydroureter, possibly cystitis.

Aortic Atherosclerosis ([3R]-[3R]).

## 2021-03-24 IMAGING — US US TRANSVAGINAL NON-OB
1 series · 9 of 9 positions shown · non-contrast
Comparison: CT Abdomen and Pelvis without and pelvis CT with
contrast since [5J] hours today.

CLINICAL DATA: 74-year-old female status post hysterectomy with
mass inseparable from the vaginal cough and distal colon on CT
earlier today.

EXAM:
ULTRASOUND PELVIS TRANSVAGINAL
TECHNIQUE: Transvaginal ultrasound examination of the pelvis was performed
including evaluation of the uterus, ovaries, adnexal regions, and
pelvic cul-de-sac.

[Series 1: us pelvis transvaginal non-ob (tv only) · 9 acquisitions, 9 frames shown]
[im 1/9]
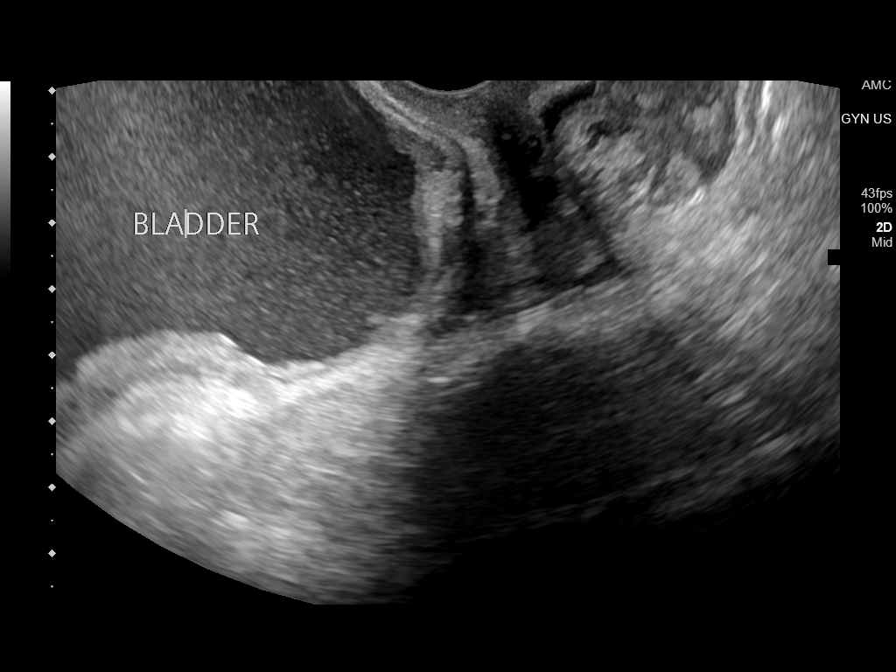
[im 2/9]
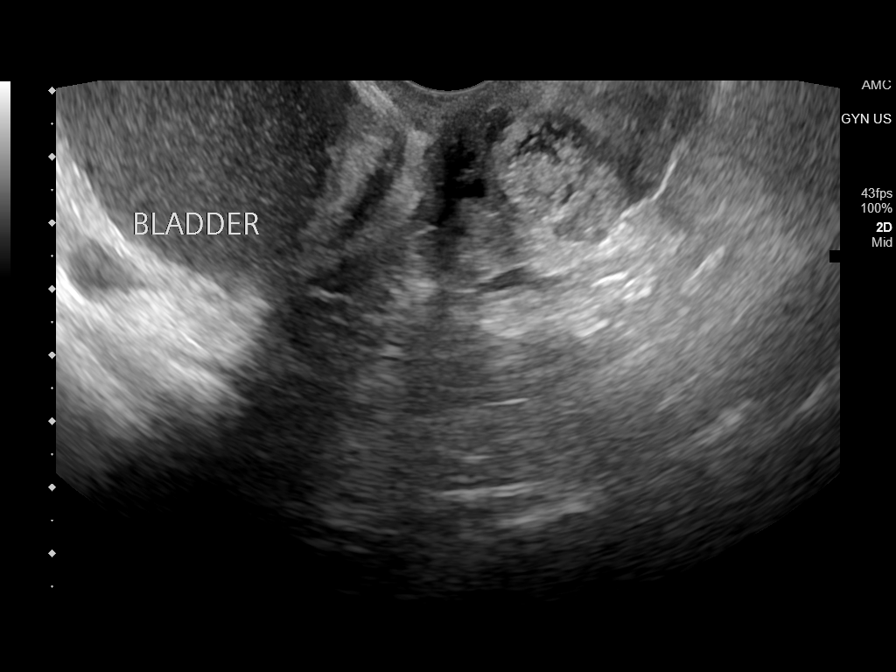
[im 3/9]
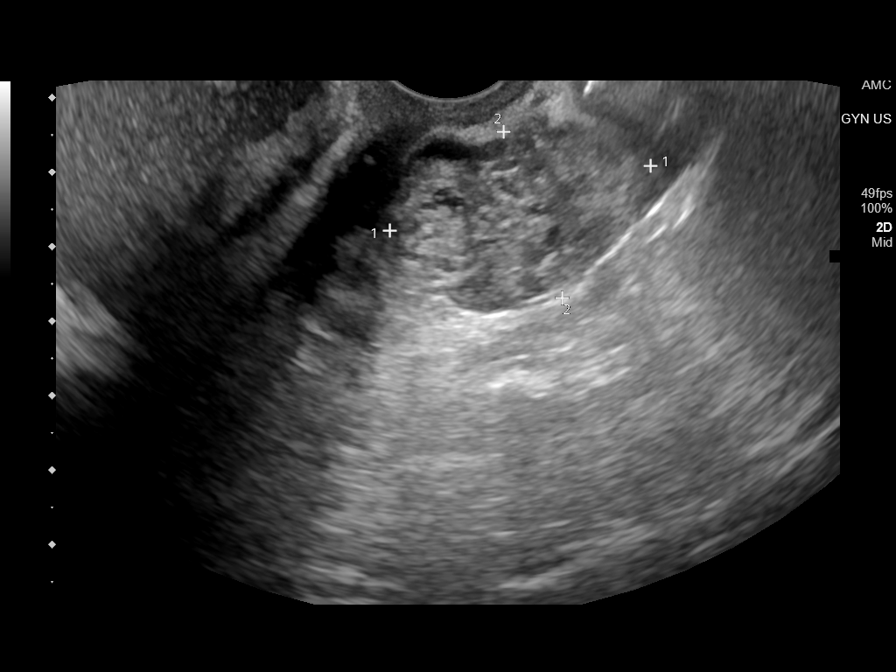
[im 4/9]
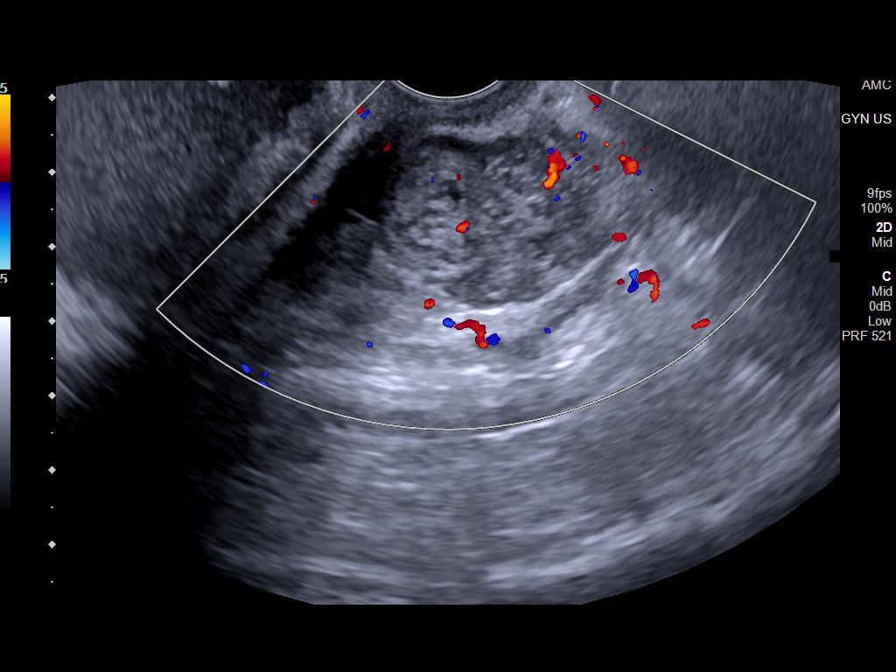
[im 5/9]
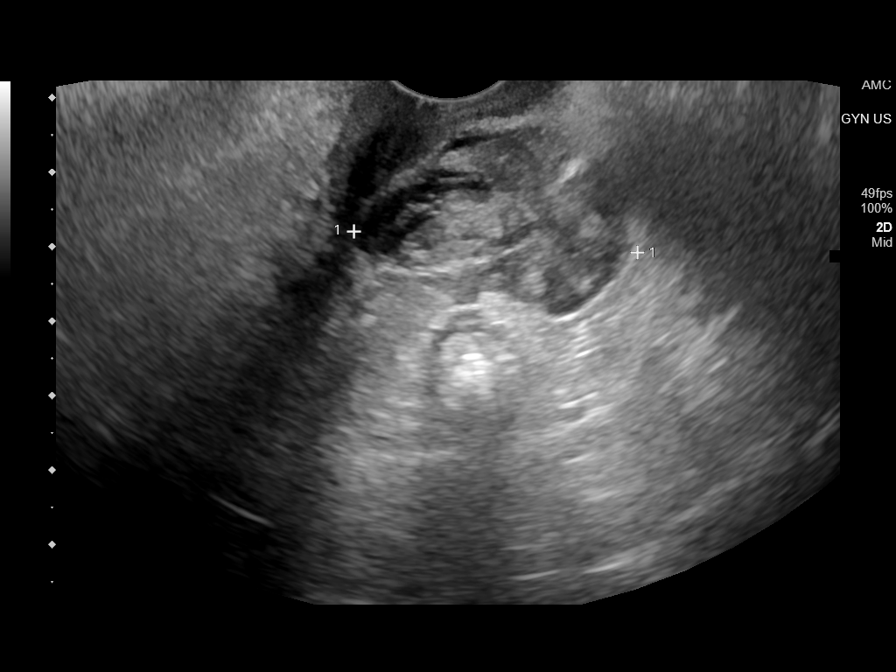
[im 6/9]
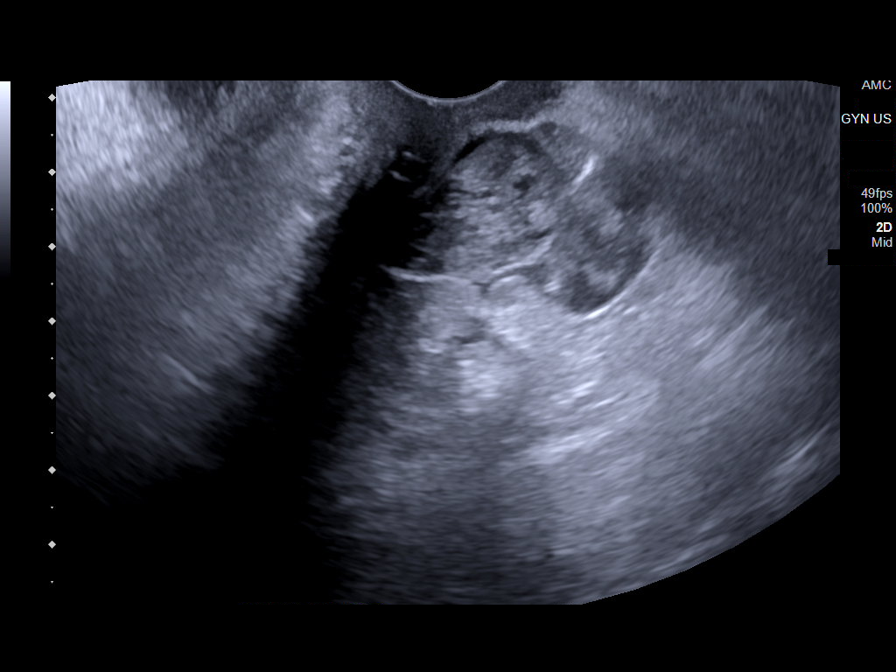
[im 7/9]
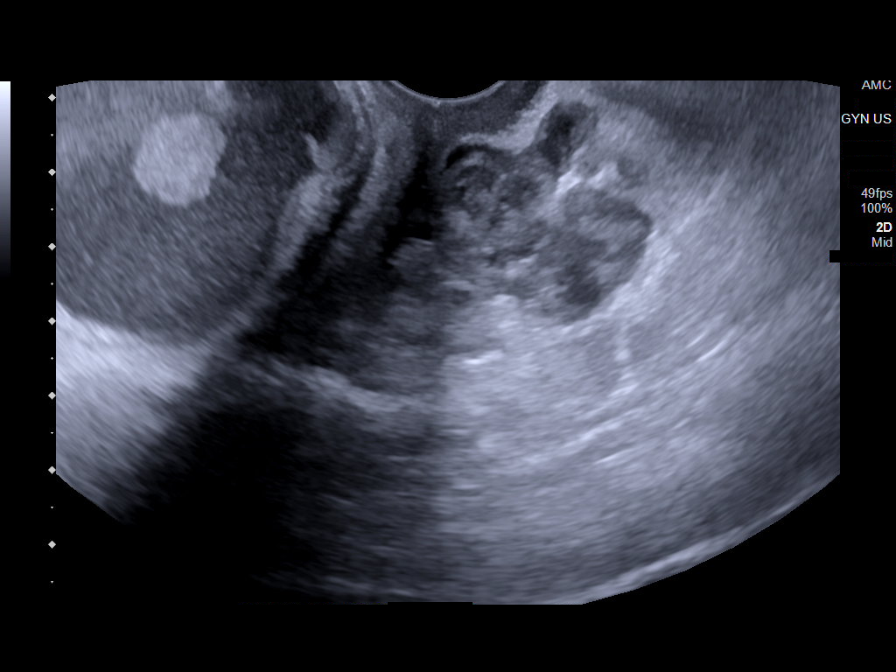
[im 8/9]
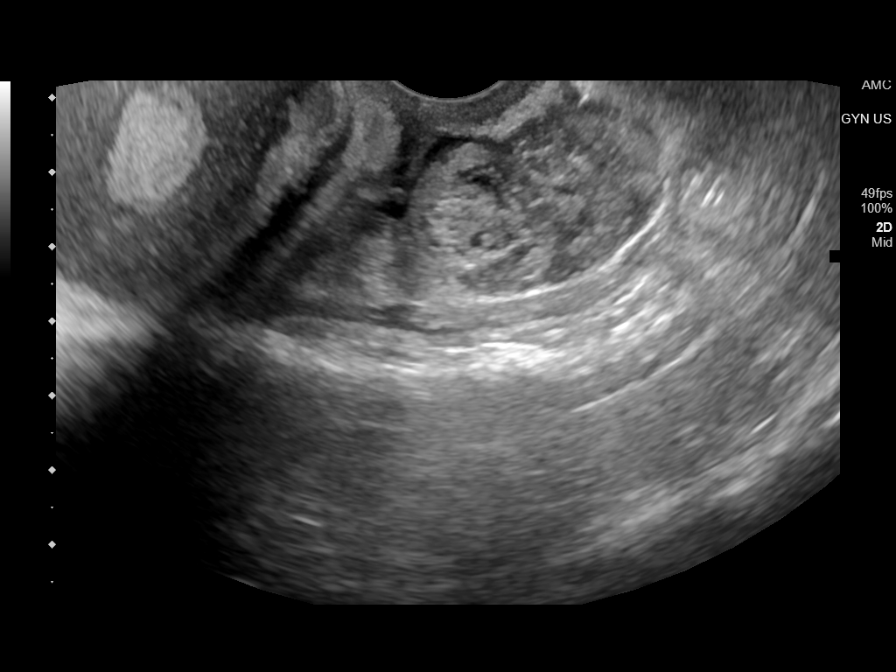
[im 9/9]
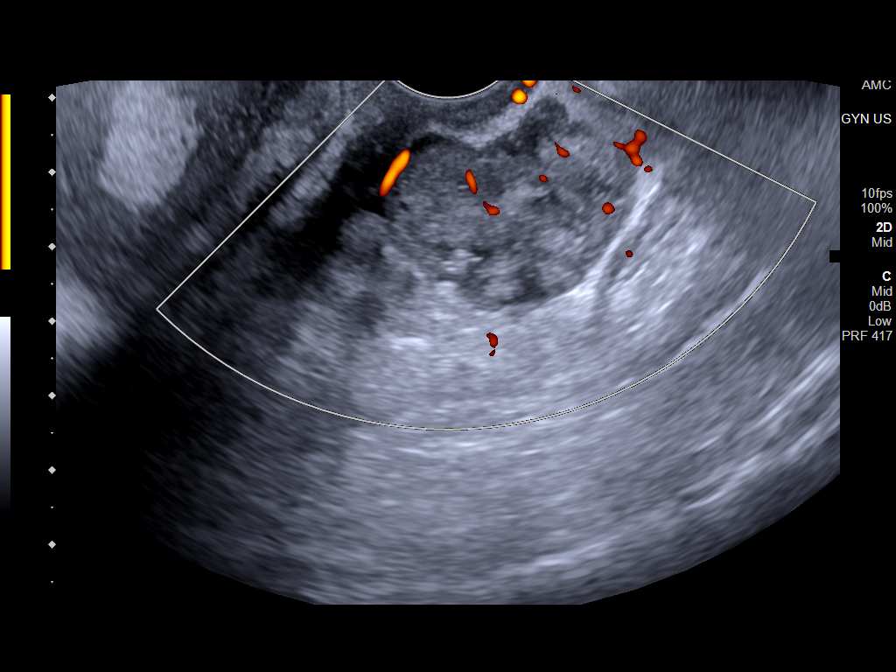

[9 of 9 positions shown; findings below may reference images not displayed]

FINDINGS: Uterus

Surgically absent

Right ovary

Not identified on this exam

Left ovary

Not identified on this exam

Other findings:

Echogenic debris throughout the distended urinary bladder (image 2).

Lobulated mixed echogenicity solid and vascular soft tissue mass
seems to be eccentric to the vaginal cough and located to the left
of midline (image 4) measuring 3.6 x 3.8 x 2.4 cm. See cine series 2
and 3.

No pelvic free fluid.
IMPRESSION: 1. Lobulated solid 3.8 cm mass confirmed by ultrasound. This is most
compatible with a neoplasm but remains indeterminate as to the
origin as the epicenter seems to be outside of both the vaginal cuff
and the adjacent colon, while the lesion appears inseparable from
both.
2. Extensive echogenic debris within the distended urinary bladder.

## 2021-03-24 IMAGING — CT CT ABD-PELV W/O CM
2 of 4 series · 16 of 46 positions shown, 18 images · non-contrast
Comparison: None.

CLINICAL DATA: Abdominal pain. Intermittent abdominal cramping with
decreased urine output.

EXAM:
CT ABDOMEN AND PELVIS WITHOUT CONTRAST
TECHNIQUE: Multidetector CT imaging of the abdomen and pelvis was performed
following the standard protocol without IV contrast.

[Series 2: axial st · axial · 0.93mm/px · z∈[-1056,-691]mm · 13 of 81 slices shown, 15 images]
[im 4/81  soft-tissue]
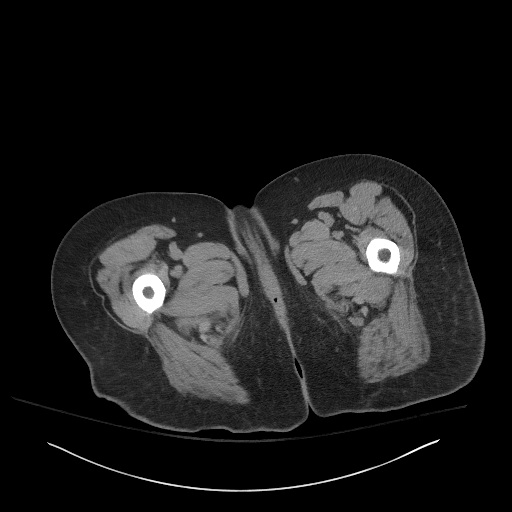
[im 4/81  bone]
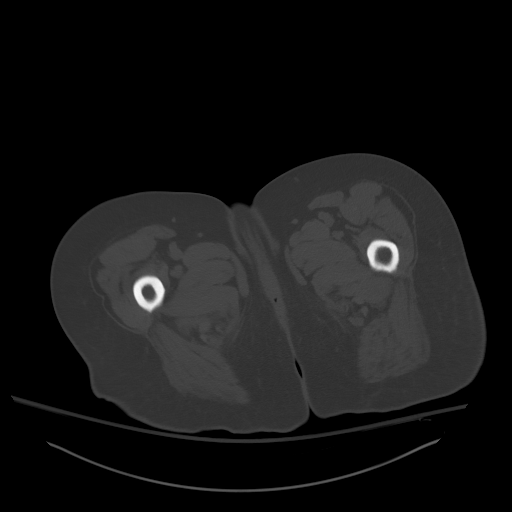
[im 11/81  soft-tissue]
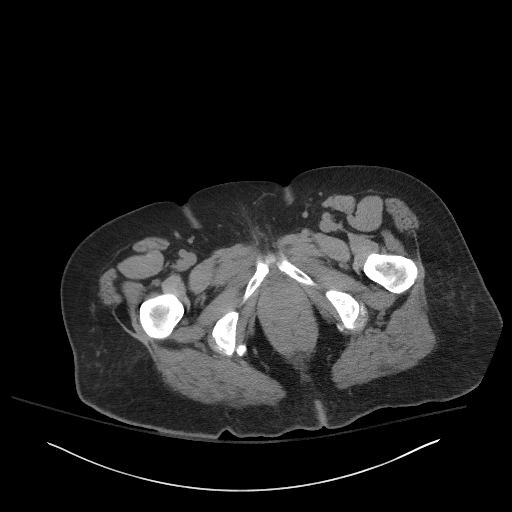
[im 18/81  soft-tissue]
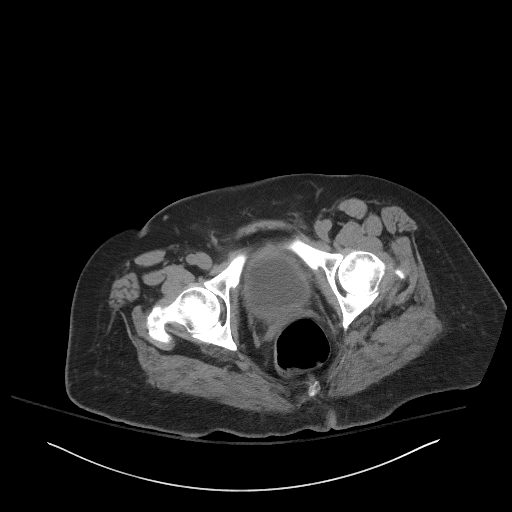
[im 21/81  soft-tissue]
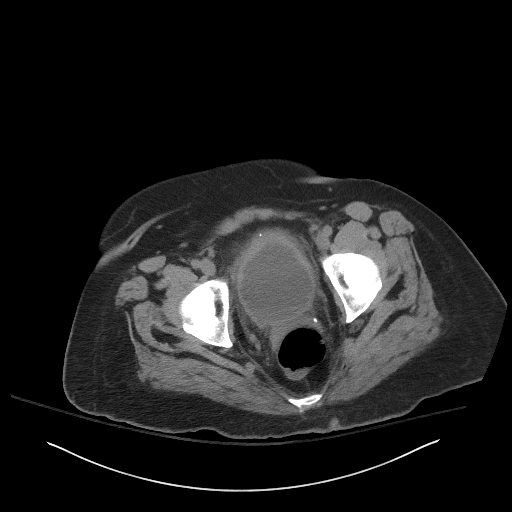
[im 28/81  soft-tissue]
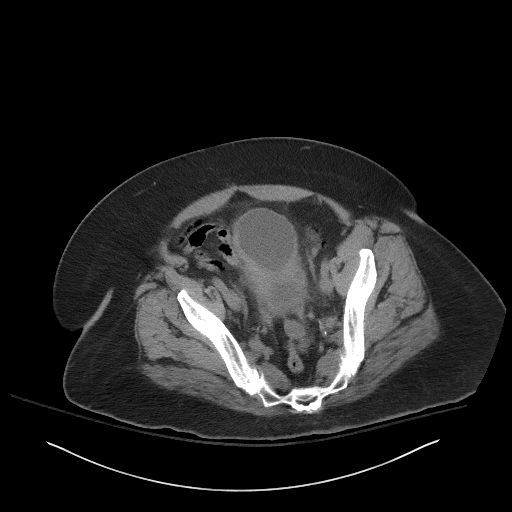
[im 35/81  soft-tissue]
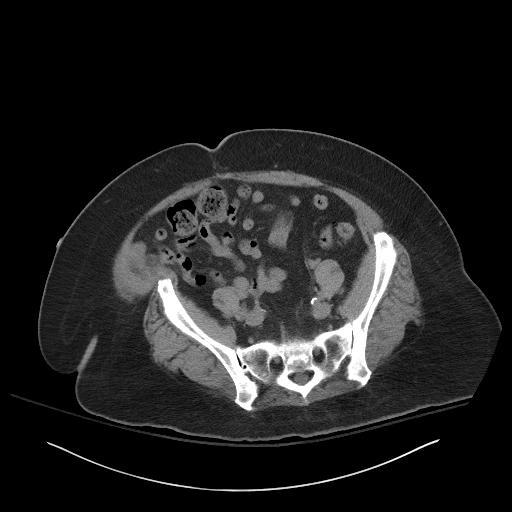
[im 42/81  soft-tissue]
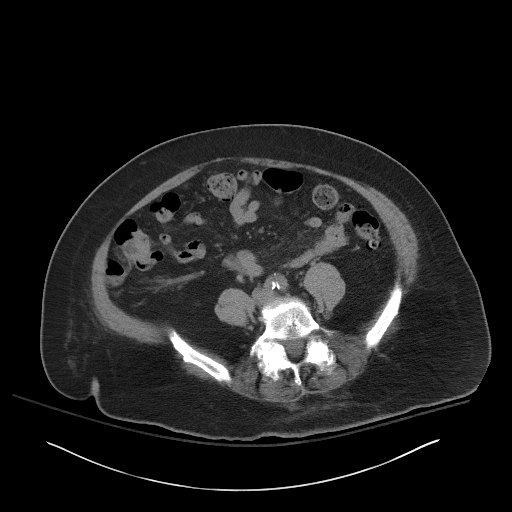
[im 46/81  soft-tissue]
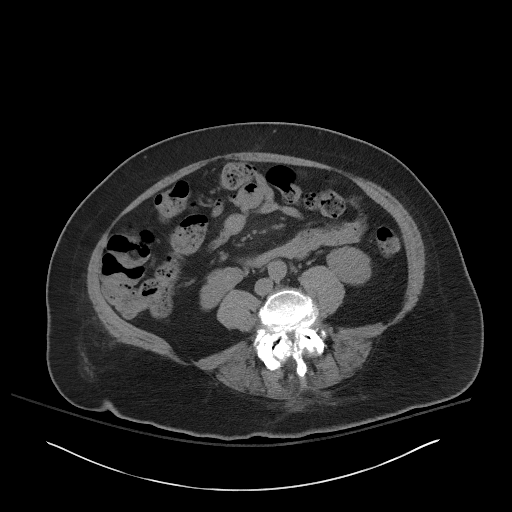
[im 53/81  soft-tissue]
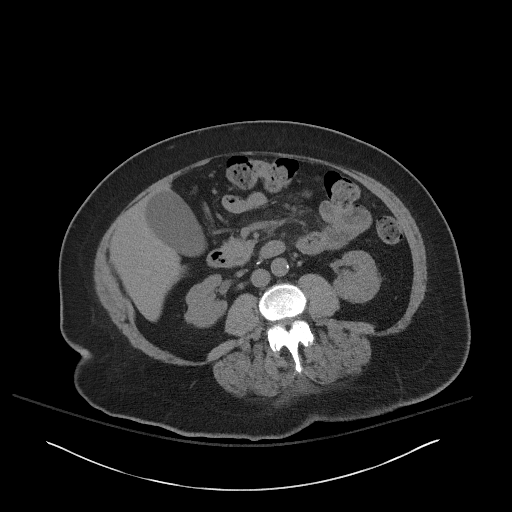
[im 53/81  bone]
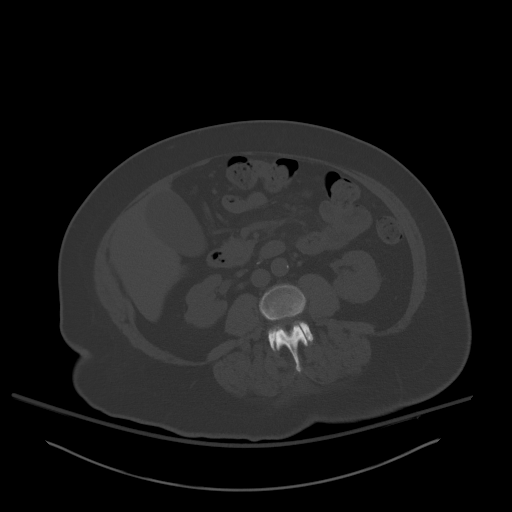
[im 60/81  soft-tissue]
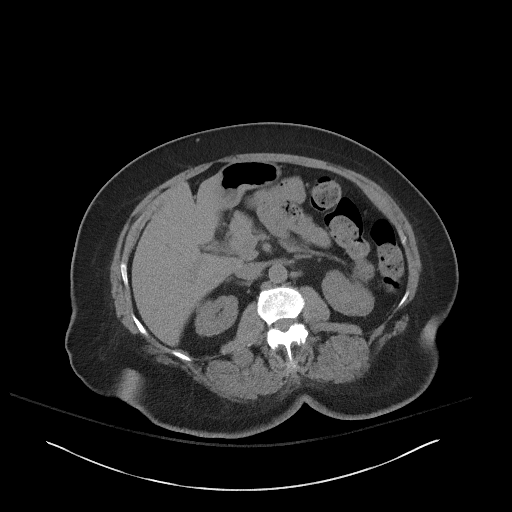
[im 63/81  soft-tissue]
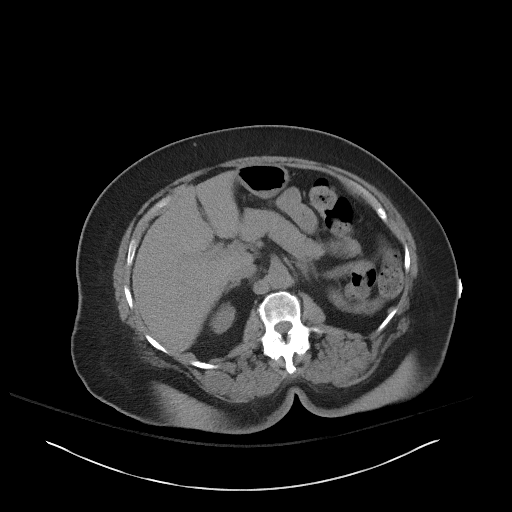
[im 70/81  soft-tissue]
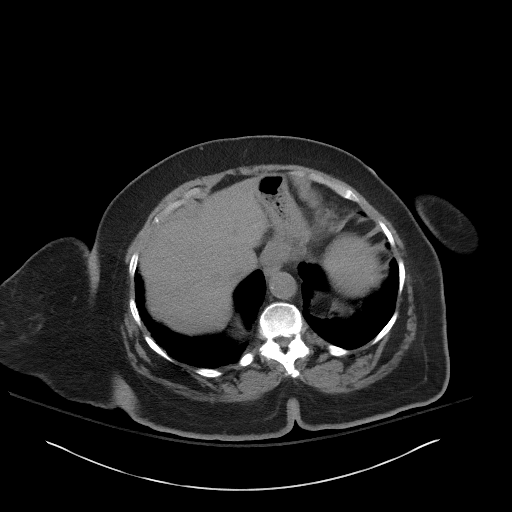
[im 77/81  soft-tissue]
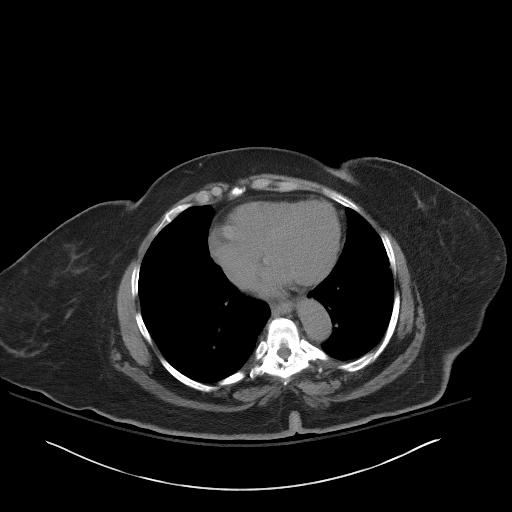

[Series 5: coronal st · coronal · 0.82mm/px · 3 of 101 slices shown]
[im 34/101  soft-tissue]
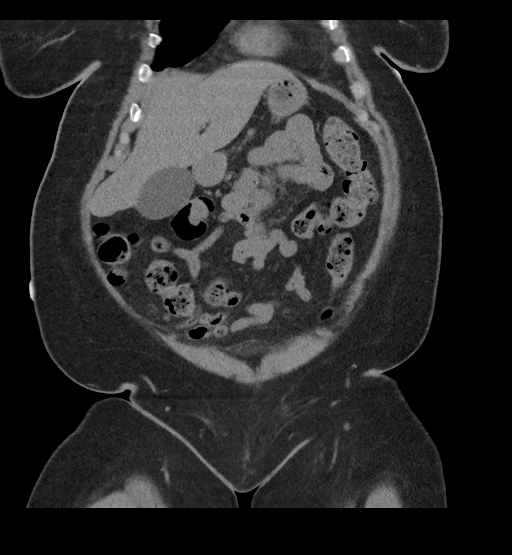
[im 45/101  soft-tissue]
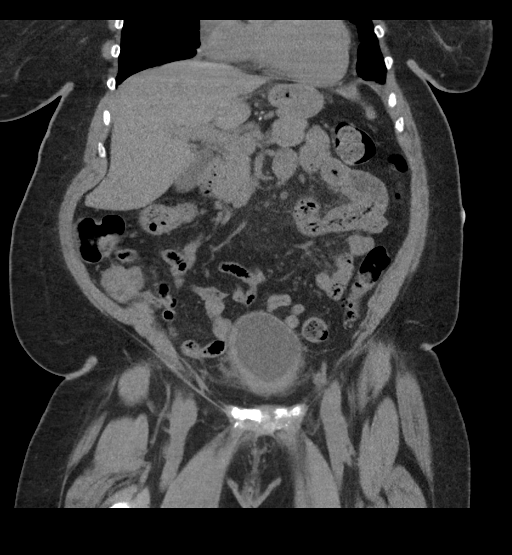
[im 56/101  soft-tissue]
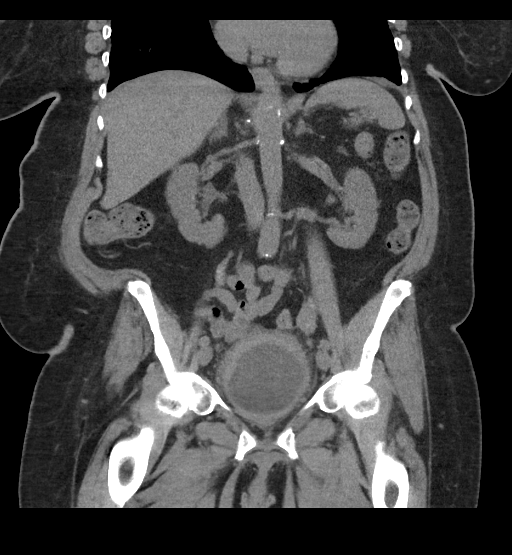

[16 of 46 positions shown; findings below may reference images not displayed]

FINDINGS: Lower chest: No acute abnormality.

Hepatobiliary: No focal liver abnormality is seen. No gallstones,
gallbladder wall thickening, or biliary dilatation.

Pancreas: Unremarkable. No pancreatic ductal dilatation or
surrounding inflammatory changes.

Spleen: Normal in size without focal abnormality.

Adrenals/Urinary Tract: Normal adrenal glands. Stone identified
within the upper pole of left kidney measuring 3 mm. Two cortical
calcifications are noted within the lateral cortex of the lower pole
of right kidney. Bilateral renal cortical scarring. No
hydronephrosis or mass identified. There is marked bladder wall
thickening measuring up to 1.3 cm in thickness is noted with
surrounding soft tissue stranding, image 58/2. Diverticula arises
off the posterior aspect of the left bladder wall measuring 1.8 cm.

Stomach/Bowel: Stomach appears normal. The appendix is visualized
and is within normal limits. No bowel wall thickening, inflammation,
or distension.

Vascular/Lymphatic: Aortic atherosclerosis. No aneurysm. No
abdominopelvic adenopathy.

Reproductive: Status post hysterectomy. There is a lobulated mass
between the posterior wall of bladder and rectum which appears to
arise off the vaginal cuff. This measures 3.7 x 3.4 by 3.4 cm. This
is indeterminate and difficult to characterize reflecting lack of IV
contrast material.

Other: There is no free fluid or fluid collections.

Musculoskeletal: Left sacroiliitis. Lumbar spondylosis is
identified. First degree anterolisthesis of L4 on L5 is identified.
Bilateral facet arthropathy noted within the lumbar spine.
IMPRESSION: 1. Marked bladder wall thickening with surrounding soft tissue
stranding is noted. Findings are concerning for cystitis.
2. There is a lobulated mass between the posterior wall of bladder
and rectum which appears to arise off the vaginal cuff. This is
indeterminate and difficult to characterize reflecting lack of IV
contrast material. Suggest initial workup with either pelvic
sonogram or CT of the pelvis with IV and oral contrast.
3. Nonobstructing left renal calculus.
4. Left sacroiliitis.
5. Lumbar spondylosis.
6. Aortic atherosclerosis.

Aortic Atherosclerosis ([S6]-[S6]).

## 2021-03-24 MED ORDER — DICLOFENAC SODIUM 1 % EX GEL
2.0000 g | CUTANEOUS | Status: AC
  Administered 2021-03-24: 2 g via TOPICAL
  Filled 2021-03-24: qty 100

## 2021-03-24 MED ORDER — CEPHALEXIN 500 MG PO CAPS: 500.0000 mg | ORAL_CAPSULE | Freq: Three times a day (TID) | ORAL | 0 refills | Status: AC

## 2021-03-24 MED ORDER — OXYCODONE HCL 5 MG PO TABS
5.0000 mg | ORAL_TABLET | Freq: Once | ORAL | Status: AC
  Administered 2021-03-24: 5 mg via ORAL
  Filled 2021-03-24: qty 1

## 2021-03-24 MED ORDER — CEPHALEXIN 500 MG PO CAPS: 500.0000 mg | ORAL_CAPSULE | Freq: Once | ORAL | Status: DC

## 2021-03-24 MED ORDER — IOHEXOL 300 MG/ML  SOLN
100.0000 mL | Freq: Once | INTRAMUSCULAR | Status: AC | PRN
  Administered 2021-03-24: 100 mL via INTRAVENOUS
  Filled 2021-03-24: qty 100

## 2021-03-24 MED ORDER — SODIUM CHLORIDE 0.9 % IV SOLN
1.0000 g | Freq: Once | INTRAVENOUS | Status: AC
  Administered 2021-03-24: 1 g via INTRAVENOUS
  Filled 2021-03-24: qty 10

## 2021-03-24 MED ORDER — OXYCODONE-ACETAMINOPHEN 5-325 MG PO TABS: 1.0000 | ORAL_TABLET | ORAL | 0 refills | Status: DC | PRN

## 2021-03-24 NOTE — ED Notes (Signed)
Pt cleansed of incontinent urine after ultrasound. [facility].

## 2021-03-24 NOTE — ED Provider Notes (Signed)
[facility] Emergency Department Provider Note  ____________________________________________  Time seen: Approximately 12:10 AM  I have reviewed the triage vital signs and the nursing notes.   HISTORY  Chief Complaint Abdominal Pain   HPI Chelsea [name] is a 75 y.o. female with a history of hypertension, hyperlipidemia, spinal stenosis, urinary incontinence who presents for evaluation of abdominal pain.  Patient describes that she has been having pain in her abdomen for at least 2 weeks.  The pain comes at random times and she has not been able to associate with anything.  She describes the pain is mostly on the right side of her abdomen but she feels that is worse on the right lower part.  She describes the pain as sharp.  She reports that she is incontinent of urine which is a chronic issue for her.  When she passes urine she reports that her whole body shivers.  She denies dysuria but she does report that the pain is worse when that happens.  She denies nausea or vomiting, chest pain or shortness of breath, flank pain, fever or chills.  She denies constipation or diarrhea.  She reports that her pain is currently 5 out of 10.  Past Medical History:  Diagnosis Date   Anxiety    Arthritis    Depression    Hyperlipidemia    Hypertension    Seizures ([place])    Spinal stenosis    Ulcer    Urinary incontinence     Patient Active Problem List   Diagnosis Date Noted   Class 2 severe obesity due to excess calories with serious comorbidity and body mass index (BMI) of 37.0 to 37.9 in adult [facility]) 03/16/2021   Cervical myelopathy ([place]) 01/28/2021   Mixed incontinence 01/28/2021   Chronic anemia 09/26/2018   Depression, major, recurrent, in partial remission ([place]) 01/11/2018   Pure hypercholesterolemia 01/11/2018   GAD (generalized anxiety disorder) 05/14/2011   Essential hypertension 02/06/2011   Spinal stenosis of lumbosacral region 02/06/2011    Past  Surgical History:  Procedure Laterality Date   ABDOMINAL HYSTERECTOMY     BACK SURGERY     due to polio   BREAST BIOPSY Bilateral    neg   BREAST BIOPSY Right 2011   neg/stereo   CARPAL TUNNEL RELEASE     CATARACT EXTRACTION Right 2020    Prior to Admission medications   Medication Sig Start Date End Date Taking? Authorizing Provider  cephALEXin (KEFLEX) 500 MG capsule Take 1 capsule (500 mg total) by mouth 3 (three) times daily for 7 days. 03/24/21 03/31/21 Yes Pascual Mantel, [place], MD  acetaminophen (TYLENOL) 500 MG tablet Take 500 mg by mouth every 6 (six) hours as needed.    [provider]  ALPRAZolam Duanne Moron) 0.5 MG tablet Take 1 tablet (0.5 mg total) by mouth 2 (two) times daily as needed for anxiety. 01/03/21   Karamalegos, Devonne Doughty, DO  aspirin 325 MG tablet Take 325 mg by mouth daily.    [provider]  atorvastatin (LIPITOR) 40 MG tablet Take 1 tablet (40 mg total) by mouth daily. 02/26/21   Jearld Fenton, NP  busPIRone (BUSPAR) 5 MG tablet Take 1 tablet (5 mg total) by mouth 3 (three) times daily. 03/13/21   Jearld Fenton, NP  Cyanocobalamin (VITAMIN B 12 PO) Take 500 mcg by mouth daily.    [provider]  diclofenac Sodium (VOLTAREN) 1 % GEL Apply 4 g topically 4 (four) times daily. 12/17/20  Kathrine Haddock, NP  FLUoxetine (PROZAC) 20 MG capsule Take 3 capsules (60 mg total) by mouth every morning. Patient taking differently: Take 20 mg by mouth as needed. 02/27/21   Jearld Fenton, NP  gabapentin (NEURONTIN) 100 MG capsule Take 2 capsules (200 mg total) by mouth 2 (two) times daily. 01/03/21   Karamalegos, Devonne Doughty, DO  lidocaine (LIDODERM) 5 % Place 1 patch onto the skin daily. Remove & Discard patch within 12 hours or as directed by MD 10/24/20   Max Sane, MD  polyethylene glycol powder (GLYCOLAX/MIRALAX) 17 GM/SCOOP powder Take 17 g by mouth daily. 01/28/21   Jearld Fenton, NP  triamterene-hydrochlorothiazide (MAXZIDE-25) 37.5-25 MG tablet  TAKE 1 TABLET BY MOUTH EVERY DAY 02/24/21   Parks Ranger, Devonne Doughty, DO  simvastatin (ZOCOR) 40 MG tablet TAKE 1 TABLET BY MOUTH EVERY DAY 10/30/19 02/28/20  Karamalegos, Devonne Doughty, DO    Allergies Penicillins  Family History  Problem Relation Age of Onset   Arthritis Mother    Heart disease Mother    Stroke Mother    Hypertension Mother    COPD Mother    Sudden death Sister    Other Father        unknown medical history   Breast cancer Neg Hx     Social History Social History   Tobacco Use   Smoking status: Never   Smokeless tobacco: Never  Vaping Use   Vaping Use: Never used  Substance Use Topics   Alcohol use: Never   Drug use: No    Review of Systems  Constitutional: Negative for fever. Eyes: Negative for visual changes. ENT: Negative for sore throat. Neck: No neck pain  Cardiovascular: Negative for chest pain. Respiratory: Negative for shortness of breath. Gastrointestinal: + right sided abdominal pain. No vomiting or diarrhea. Genitourinary: Negative for dysuria. Musculoskeletal: Negative for back pain. Skin: Negative for rash. Neurological: Negative for headaches, weakness or numbness. Psych: No SI or HI  ____________________________________________   PHYSICAL EXAM:  VITAL SIGNS: ED Triage Vitals  Enc Vitals Group     BP 03/23/21 1913 127/83     Pulse Rate 03/23/21 1913 76     Resp 03/23/21 1913 16     Temp 03/23/21 1913 98.8 F (37.1 C)     Temp Source 03/23/21 1913 Oral     SpO2 03/23/21 1913 99 %     Weight 03/23/21 1910 183 lb 13.8 oz (83.4 kg)     Height 03/23/21 1910 4\' 11"  (1.499 m)     Head Circumference --      Peak Flow --      Pain Score 03/23/21 1910 6     Pain Loc --      Pain Edu? --      Excl. in [place]? --     Constitutional: Alert and oriented. Well appearing and in no apparent distress. HEENT:      Head: Normocephalic and atraumatic.         Eyes: Conjunctivae are normal. Sclera is non-icteric.       Mouth/Throat:  Mucous membranes are moist.       Neck: Supple with no signs of meningismus. Cardiovascular: Regular rate and rhythm. No murmurs, gallops, or rubs. 2+ symmetrical distal pulses are present in all extremities. No JVD. Respiratory: Normal respiratory effort. Lungs are clear to auscultation bilaterally.  Gastrointestinal: Soft, patient is tender to palpation over the periumbilical region, no RUQ ttp , no epigastric ttp. non distended with positive bowel sounds.  No rebound or guarding. Genitourinary: No CVA tenderness. Musculoskeletal:  No edema, cyanosis, or erythema of extremities. Neurologic: Normal speech and language. Face is symmetric. Moving all extremities. No gross focal neurologic deficits are appreciated. Skin: Skin is warm, dry and intact. No rash noted. Psychiatric: Mood and affect are normal. Speech and behavior are normal.  ____________________________________________   LABS (all labs ordered are listed, but only abnormal results are displayed)  Labs Reviewed  LIPASE, BLOOD - Abnormal; Notable for the following components:      Result Value   Lipase 78 (*)    All other components within normal limits  COMPREHENSIVE METABOLIC PANEL - Abnormal; Notable for the following components:   Glucose, Bld 103 (*)    GFR, Estimated 59 (*)    All other components within normal limits  CBC - Abnormal; Notable for the following components:   Hemoglobin 11.7 (*)    All other components within normal limits  URINALYSIS, COMPLETE (UACMP) WITH MICROSCOPIC - Abnormal; Notable for the following components:   Color, Urine YELLOW (*)    APPearance TURBID (*)    Hgb urine dipstick SMALL (*)    Protein, ur 100 (*)    Leukocytes,Ua LARGE (*)    WBC, UA >50 (*)    Bacteria, UA MANY (*)    All other components within normal limits  URINE CULTURE   ____________________________________________  EKG  ED ECG REPORT I, Rudene Re, the attending physician, personally viewed and  interpreted this ECG.  Normal sinus rhythm with a rate of 71, normal intervals, normal axis, inferior and anterior Q waves but no ST elevations or depressions.  Unchanged from prior from May 2020 ____________________________________________  RADIOLOGY  I have personally reviewed the images performed during this visit and I agree with the Radiologist's read.   Interpretation by Radiologist:  CT ABDOMEN PELVIS WO CONTRAST  Result Date: 03/24/2021 CLINICAL DATA:  Abdominal pain. Intermittent abdominal cramping with decreased urine output. EXAM: CT ABDOMEN AND PELVIS WITHOUT CONTRAST TECHNIQUE: Multidetector CT imaging of the abdomen and pelvis was performed following the standard protocol without IV contrast. COMPARISON:  None. FINDINGS: Lower chest: No acute abnormality. Hepatobiliary: No focal liver abnormality is seen. No gallstones, gallbladder wall thickening, or biliary dilatation. Pancreas: Unremarkable. No pancreatic ductal dilatation or surrounding inflammatory changes. Spleen: Normal in size without focal abnormality. Adrenals/Urinary Tract: Normal adrenal glands. Stone identified within the upper pole of left kidney measuring 3 mm. Two cortical calcifications are noted within the lateral cortex of the lower pole of right kidney. Bilateral renal cortical scarring. No hydronephrosis or mass identified. There is marked bladder wall thickening measuring up to 1.3 cm in thickness is noted with surrounding soft tissue stranding, image 58/2. Diverticula arises off the posterior aspect of the left bladder wall measuring 1.8 cm. Stomach/Bowel: Stomach appears normal. The appendix is visualized and is within normal limits. No bowel wall thickening, inflammation, or distension. Vascular/Lymphatic: Aortic atherosclerosis. No aneurysm. No abdominopelvic adenopathy. Reproductive: Status post hysterectomy. There is a lobulated mass between the posterior wall of bladder and rectum which appears to arise off the  vaginal cuff. This measures 3.7 x 3.4 by 3.4 cm. This is indeterminate and difficult to characterize reflecting lack of IV contrast material. Other: There is no free fluid or fluid collections. Musculoskeletal: Left sacroiliitis. Lumbar spondylosis is identified. First degree anterolisthesis of L4 on L5 is identified. Bilateral facet arthropathy noted within the lumbar spine. IMPRESSION: 1. Marked bladder wall thickening with surrounding soft tissue stranding is noted.  Findings are concerning for cystitis. 2. There is a lobulated mass between the posterior wall of bladder and rectum which appears to arise off the vaginal cuff. This is indeterminate and difficult to characterize reflecting lack of IV contrast material. Suggest initial workup with either pelvic sonogram or CT of the pelvis with IV and oral contrast. 3. Nonobstructing left renal calculus. 4. Left sacroiliitis. 5. Lumbar spondylosis. 6. Aortic atherosclerosis. Aortic Atherosclerosis (ICD10-I70.0). Electronically Signed   By: Kerby Moors M.D.   On: 03/24/2021 00:56   CT PELVIS W CONTRAST  Result Date: 03/24/2021 CLINICAL DATA:  Abdominal pain EXAM: CT PELVIS WITH CONTRAST TECHNIQUE: Multidetector CT imaging of the pelvis was performed using the standard protocol following the bolus administration of intravenous contrast. CONTRAST:  118mL OMNIPAQUE IOHEXOL 300 MG/ML  SOLN COMPARISON:  None. FINDINGS: Urinary Tract: There is diffuse urinary bladder wall thickening. Mild bilateral hydroureter. Bowel: There is abnormal eccentric soft tissue of the right cecum (3:16). Vascular/Lymphatic: Calcific aortic atherosclerosis. Reproductive: Low attenuation mass in the region of the vaginal cuff is again noted. Other:  None Musculoskeletal: Facet arthrosis. IMPRESSION: 1. Abnormal eccentric soft tissue of the right cecum, concerning for colon carcinoma. Colonoscopy recommended. 2. Low attenuation mass in the region of the vaginal cuff is again noted.  Unfortunately, the intravenous contrast is not particularly helpful in this case. Pelvic ultrasound or pelvic MRI with and without contrast may provide more information. 3. Diffuse urinary bladder wall thickening with mild bilateral hydroureter, possibly cystitis. Aortic Atherosclerosis (ICD10-I70.0). Electronically Signed   By: Ulyses Jarred M.D.   On: 03/24/2021 03:24   US PELVIS TRANSVAGINAL NON-OB (TV ONLY)  Result Date: 03/24/2021 CLINICAL DATA:  75 year old female status post hysterectomy with mass inseparable from the vaginal cough and distal colon on CT earlier today. EXAM: ULTRASOUND PELVIS TRANSVAGINAL TECHNIQUE: Transvaginal ultrasound examination of the pelvis was performed including evaluation of the uterus, ovaries, adnexal regions, and pelvic cul-de-sac. COMPARISON:  CT Abdomen and Pelvis without and pelvis CT with contrast since 0015 hours today. FINDINGS: Uterus Surgically absent Right ovary Not identified on this exam Left ovary Not identified on this exam Other findings: Echogenic debris throughout the distended urinary bladder (image 2). Lobulated mixed echogenicity solid and vascular soft tissue mass seems to be eccentric to the vaginal cough and located to the left of midline (image 4) measuring 3.6 x 3.8 x 2.4 cm. See cine series 2 and 3. No pelvic free fluid. IMPRESSION: 1. Lobulated solid 3.8 cm mass confirmed by ultrasound. This is most compatible with a neoplasm but remains indeterminate as to the origin as the epicenter seems to be outside of both the vaginal cuff and the adjacent colon, while the lesion appears inseparable from both. 2. Extensive echogenic debris within the distended urinary bladder. Electronically Signed   By: Genevie Ann M.D.   On: 03/24/2021 05:34   US ABDOMEN LIMITED RUQ (LIVER/GB)  Result Date: 03/23/2021 CLINICAL DATA:  Pancreatitis. EXAM: ULTRASOUND ABDOMEN LIMITED RIGHT UPPER QUADRANT COMPARISON:  None. FINDINGS: Gallbladder: Single calcified gallstone noted  within the gallbladder lumen measuring up to 2.3 cm. Wall thickening or pericholecystic fluid visualized. No sonographic Murphy sign noted by sonographer. Common bile duct: Diameter: 5 mm. Liver: No focal lesion identified. Within normal limits in parenchymal echogenicity. Portal vein is patent on color Doppler imaging with normal direction of blood flow towards the liver. Other: None. IMPRESSION: Cholelithiasis with no acute cholecystitis or choledocholithiasis. Electronically Signed   By: Iven Finn M.D.   On: 03/23/2021  23:55     ____________________________________________   PROCEDURES  Procedure(s) performed: None Procedures Critical Care performed:  None ____________________________________________   INITIAL IMPRESSION / ASSESSMENT AND PLAN / ED COURSE  75 y.o. female with a history of hypertension, hyperlipidemia, spinal stenosis, urinary incontinence who presents for evaluation of abdominal pain.  Patient is complaining of mostly right-sided abdominal pain but she is tender periumbilically on exam.  She has no right upper quadrant tenderness, no epigastric tenderness, negative Murphy sign.  She is otherwise well-appearing with normal vital signs.  Normal strong pulses in all 4 extremities.  Differential diagnosis including appendicitis versus gallbladder pathology versus pancreatitis versus diverticulitis versus volvulus versus pyelonephritis versus UTI versus malignancy versus pyelonephritis versus kidney stones.  Since patient is incontinent of urine we will get a catheterized sample.  Labs showing no leukocytosis, mild stable anemia, normal electrolytes, normal LFTs.  Mildly elevated lipase at 78 therefore patient was sent for a right upper quadrant [place].  Ultrasound showing cholelithiasis with no evidence of cholecystitis or choledocholithiasis.  UA was positive for UTI for which she received Rocephin.  Culture has been sent.  Patient was then sent for a CT of her abdomen pelvis due  to pain being more localized to the lower part of her abdomen.  Radiologist was concerned for a pelvic mass and recommended repeat CT with contrast and a pelvic ultrasound.  It is unclear if this mass is arising from the vagina or the colon but the radiologist pretty concerned that this is malignancy.  Therefore I discussed with our oncology team, Beckey Rutter, NP who was on-call this evening to expedite outpatient work-up for this mass.  Patient be discharged home with a prescription for Percocet for pain, Keflex for UTI and follow-up with oncology.  Discussed my standard return precautions.  The results, outpatient follow-up, and treatment were discussed with patient and her caregiver who was at bedside      _____________________________________________ Please note:  Patient was evaluated in Emergency Department today for the symptoms described in the history of present illness. Patient was evaluated in the context of the global COVID-19 pandemic, which necessitated consideration that the patient might be at risk for infection with the SARS-CoV-2 virus that causes COVID-19. Institutional protocols and algorithms that pertain to the evaluation of patients at risk for COVID-19 are in a state of rapid change based on information released by regulatory bodies including the CDC and federal and state organizations. These policies and algorithms were followed during the patient's care in the ED.  Some ED evaluations and interventions may be delayed as a result of limited staffing during the pandemic.   [place] Controlled Substance Database was reviewed by me. ____________________________________________   FINAL CLINICAL IMPRESSION(S) / ED DIAGNOSES   Final diagnoses:  Pancreatitis  Pelvic mass  Acute cystitis without hematuria  Calculus of gallbladder without cholecystitis without obstruction      NEW MEDICATIONS STARTED DURING THIS VISIT:  ED Discharge Orders          Ordered    cephALEXin  (KEFLEX) 500 MG capsule  3 times daily        03/24/21 0606             Note:  This document was prepared using Dragon voice recognition software and may include unintentional dictation errors.    Alfred Levins, [place], MD 03/24/21 [phone]

## 2021-03-24 NOTE — ED Notes (Addendum)
Pt ringing call bell approx every 5 minutes for small requests. Pt requesting voltarin gel for her right arm. Order secured and placed from dr. Alfred Levins.

## 2021-03-24 NOTE — Discharge Instructions (Addendum)
As we discussed, you do have a urinary tract infection.  Make sure to take your antibiotics as prescribed.  Imaging studies also found a mass in your pelvis.  The radiologist is concerned this could be cancer but is not sure of the source.  It is very important they follow-up with oncology for further imaging and diagnosis of this mass as soon as possible.  Return to the emergency room for new or worsening abdominal pain, fever, flank pain, vomiting.  Call your primary care doctor first thing in the morning for close appointment as she can guide you as an outpatient.

## 2021-03-24 NOTE — ED Notes (Signed)
Pt placed on purewick with suction at 86mmhg

## 2021-03-24 NOTE — ED Notes (Signed)
Additional warm blankets provided to visitor and pt. Lights dimmed for visitor and pt comfort. Call bell at left side.

## 2021-03-24 NOTE — ED Notes (Signed)
Ultrasound here

## 2021-03-25 ENCOUNTER — Encounter: Payer: Self-pay | Admitting: Internal Medicine

## 2021-03-25 ENCOUNTER — Ambulatory Visit (INDEPENDENT_AMBULATORY_CARE_PROVIDER_SITE_OTHER): Payer: Medicare Other | Admitting: Internal Medicine

## 2021-03-25 ENCOUNTER — Other Ambulatory Visit: Payer: Self-pay

## 2021-03-25 VITALS — BP 99/50 | HR 85 | Temp 97.5°F | Resp 17 | Ht 59.0 in | Wt 182.0 lb

## 2021-03-25 DIAGNOSIS — N3946 Mixed incontinence: Secondary | ICD-10-CM | POA: Insufficient documentation

## 2021-03-25 DIAGNOSIS — K6389 Other specified diseases of intestine: Secondary | ICD-10-CM

## 2021-03-25 DIAGNOSIS — N1831 Chronic kidney disease, stage 3a: Secondary | ICD-10-CM | POA: Insufficient documentation

## 2021-03-25 DIAGNOSIS — I700 Atherosclerosis of aorta: Secondary | ICD-10-CM | POA: Diagnosis not present

## 2021-03-25 DIAGNOSIS — N3000 Acute cystitis without hematuria: Secondary | ICD-10-CM

## 2021-03-25 DIAGNOSIS — R1900 Intra-abdominal and pelvic swelling, mass and lump, unspecified site: Secondary | ICD-10-CM

## 2021-03-25 NOTE — Patient Instructions (Signed)
CT and MRI of the Whole Body (6th ed., pp. [phone]). [place], [place]: Elsevier."> [facility] (26th ed., pp. [phone]). [place], [place]: Elsevier.">  ????? ??????? ??? ???????? Colon Mass, Adult  ??? ??????? ??? ??? ????? ?? ???????? ???????? ????? ???? ?? ??????? ???????. ?? ???? ????? ??? ???? ?????? (?????) ?? ???? ?? ???????. ?? ????? ??? ??????? ???? ???? ??? ?????? ????? ??: ???????. ?????? ?? ??????? ???????. ??????? ?????. ????? ????? ?? ????? ??????? ?? ???????. ????? ??? ???: ??????? ???? ?????? ??????? ??? ??? ???? ?? ?????? ??????? ???????. ?????? ?????. ???? ?????? ?? ???? ???? ???? ????? ?? ???????. ???????? ??????? ??????. ???? ???? ???? ???? ????? ????? ????? ???? ????? ????. ?????? ?????? (????????) ?? ????? ????? ?? ????? ?? ?? ????? ????? ??? ??? ???? ?? ??? ???? ?? ??? ?????. ?????? ???????. ???? ???? ?? ?????? ???????. ?? ?????? ??? ?????? ?? ???????? ???? ????? ??? ?????? ?? ???: ?????? ?? ????? ?? ????? ?? ????. ??? ?? ???? ??????. ??? ?? ?????? ?? ??????? ?? ?????. ????? ????? ?? ????? ??????. ????? ?? ?????? ?? ????? ??????. ??? ?? ????????. ?????? ??????? ??? ??????? ???? ???? ??? ?????? ???? ????. ?? ??? ???????? ?? ???? ????? ??????? ???? ??????. ??? ????? ??? ??????? ?? ????? ??? ?????? ???? ??? ???????? ???????? ????? ?????? ?????? ?? ?????. ??????? ?? ????: ???? ????. ?????? ???????? ?? ??????? ??? ???????? ?? ?????? ???????? ??????????? (CT)? ?? ??????? ??????? ?????????? (MRI). ????? ???????. ???? ????? ???? ??? ????? ????? ???? ??? ??? ??? ?? ???? (???? ?????) ????? ??? ???????? (?? ???? ?????) ?? ???? ??? ?? ??? ???????? ???????? ???? ??? ???????. ??????. ???? ????? ???????? ???? ?? ???? ????? ?????? ??? ??????. ?? ???? ???? ?? ????? ????? ???????. ?? ??? ???????? ?? ????? ?? ????? ???? ??? ???? ??? ???????? ?? ?? ??????? ?????? ??? ???? ????? ??? ????? ?? ????. ??? ?????? ??? ??????? ????? ???? ??? ?????? ??? ??? ?????. ???????  ??????? ??????? ?????? ??? ????? ?????? ???? ?????? ?? ???????? ???? ???? ??? ???? ??????. ???? ??? ??? ??????????? ??? ????? ????? ????? ????? ?????. ???? ??? ????????? ?? ??????: ?????? ????????? ???? ????? ??? ??????? ?? ?????? ??? ??? ?????. ???? ??????? ???? ??????? ?????? ??????? ??. ???? ???: ????? ??????? ??? ?? ????? ?? ???? ??????? ??????? ????? ????? ????? ???? ?? ??? ???? ????. ????? ????? ?????? ????????. ???? ??? ???. ???? ?????? ??????? ?????? ?? ??????? ???????: ??? ?????? ??????? ??????? ???? ???????. ???? ????? ????? ????. ?????. ??? ??? ?????? ?? ???? ?? ?????. ????? ??? ??? ????. ????? ??????? ??????. ???? ???????? ????? ?? ??????? ???????: ???? ?? ?? ??? ???? ???? ?? ???? ???? ????? ?????? ?? ?????. ???? ?? ?? ?????? ?? ????? ???? ???? ????? ???? ???????. ??????? ???????. ???? ?? ????? ??? ???? ????. ???? ???? ?? ?????? ???? ???? ?? ???? (??????). ???? ??? ??????? ??? ??? ????? ?? ???????? ???????? ????? ???? ?? ??????? ???????. ???? ?????? ?? ??????? ???? ???? ??? ????????. ????? ?????? ??? ??? ?????. ???? ??? ??? ????? ?????? ??? ????? ????? ????? ????? ?????. ????? ??????? ??? ?? ????? ?? ???? ??????? ??????? ????? ????? ????? ???? ?? ??? ???? ????. ??? ????? ?? ??? ????????? ?? ???? ?????? ????????? ???? ?????? ???? ?????????????. ???? ?? ?????? ??? ????? ???? ?? ???? ?? ???? ??????? ??????.? Document Revised: 03/12/2020 Document Reviewed: 03/12/2020 Elsevier Patient Education  [place].

## 2021-03-25 NOTE — Assessment & Plan Note (Signed)
Consider stopping triamterene HCTZ due to to CKD and starting on lisinopril We will review this at the next visit

## 2021-03-25 NOTE — Assessment & Plan Note (Signed)
Continue atorvastatin

## 2021-03-25 NOTE — Progress Notes (Signed)
Subjective:    Patient ID: Chelsea [name], female    DOB: 08/03/1946, 75 y.o.   MRN: [id number]  HPI  Patient presents the clinic today for ER follow-up.  She went to the ER 6/19 with complaint of abdominal pain.  Right upper quadrant ultrasound showed gallstones without acute cholecystitis.  Her urine was concerning for infection.  CT abdomen pelvis without contrast showed:  IMPRESSION: 1. Marked bladder wall thickening with surrounding soft tissue stranding is noted. Findings are concerning for cystitis. 2. There is a lobulated mass between the posterior wall of bladder and rectum which appears to arise off the vaginal cuff. This is indeterminate and difficult to characterize reflecting lack of IV contrast material. Suggest initial workup with either pelvic sonogram or CT of the pelvis with IV and oral contrast. 3. Nonobstructing left renal calculus. 4. Left sacroiliitis. 5. Lumbar spondylosis. 6. Aortic atherosclerosis. Aortic Atherosclerosis (ICD10-I70.0).    This was further characterized with a CT abdomen pelvis with contrast which showed: IMPRESSION: 1. Abnormal eccentric soft tissue of the right cecum, concerning for colon carcinoma. Colonoscopy recommended. 2. Low attenuation mass in the region of the vaginal cuff is again noted. Unfortunately, the intravenous contrast is not particularly helpful in this case. Pelvic ultrasound or pelvic MRI with and without contrast may provide more information. 3. Diffuse urinary bladder wall thickening with mild bilateral hydroureter, possibly cystitis. Aortic Atherosclerosis (ICD10-I70.0).   Pelvic ultrasound showed: IMPRESSION: 1. Lobulated solid 3.8 cm mass confirmed by ultrasound. This is most compatible with a neoplasm but remains indeterminate as to the origin as the epicenter seems to be outside of both the vaginal cuff and the adjacent colon, while the lesion appears inseparable from both. 2. Extensive echogenic debris within the distended  urinary bladder.   She was treated with Keflex for her UTI and Percocet for pain management.  She was advised to follow-up with her PCP for further evaluation and treatment. She has an appt with oncology tomorrow.  Review of Systems     Past Medical History:  Diagnosis Date   Anxiety    Arthritis    Depression    Hyperlipidemia    Hypertension    Seizures ([place])    Spinal stenosis    Ulcer    Urinary incontinence     Current Outpatient Medications  Medication Sig Dispense Refill   acetaminophen (TYLENOL) 500 MG tablet Take 500 mg by mouth every 6 (six) hours as needed.     ALPRAZolam (XANAX) 0.5 MG tablet Take 1 tablet (0.5 mg total) by mouth 2 (two) times daily as needed for anxiety. 60 tablet 2   aspirin 325 MG tablet Take 325 mg by mouth daily.     atorvastatin (LIPITOR) 40 MG tablet Take 1 tablet (40 mg total) by mouth daily. 90 tablet 3   busPIRone (BUSPAR) 5 MG tablet Take 1 tablet (5 mg total) by mouth 3 (three) times daily. 90 tablet 2   cephALEXin (KEFLEX) 500 MG capsule Take 1 capsule (500 mg total) by mouth 3 (three) times daily for 7 days. 21 capsule 0   Cyanocobalamin (VITAMIN B 12 PO) Take 500 mcg by mouth daily.     diclofenac Sodium (VOLTAREN) 1 % GEL Apply 4 g topically 4 (four) times daily. 4 g 0   FLUoxetine (PROZAC) 20 MG capsule Take 3 capsules (60 mg total) by mouth every morning. (Patient taking differently: Take 20 mg by mouth as needed.) 180 capsule 0   gabapentin (NEURONTIN) 100 MG  capsule Take 2 capsules (200 mg total) by mouth 2 (two) times daily. 360 capsule 1   lidocaine (LIDODERM) 5 % Place 1 patch onto the skin daily. Remove & Discard patch within 12 hours or as directed by MD 1 patch 0   oxyCODONE-acetaminophen (PERCOCET) 5-325 MG tablet Take 1 tablet by mouth every 4 (four) hours as needed. 12 tablet 0   polyethylene glycol powder (GLYCOLAX/MIRALAX) 17 GM/SCOOP powder Take 17 g by mouth daily. 3350 g 1   triamterene-hydrochlorothiazide (MAXZIDE-25)  37.5-25 MG tablet TAKE 1 TABLET BY MOUTH EVERY DAY 90 tablet 1   No current facility-administered medications for this visit.    Allergies  Allergen Reactions   Penicillins     "Everything turned black"    Family History  Problem Relation Age of Onset   Arthritis Mother    Heart disease Mother    Stroke Mother    Hypertension Mother    COPD Mother    Sudden death Sister    Other Father        unknown medical history   Breast cancer Neg Hx     Social History   Socioeconomic History   Marital status: Divorced    Spouse name: Not on file   Number of children: Not on file   Years of education: Not on file   Highest education level: Not on file  Occupational History   Not on file  Tobacco Use   Smoking status: Never   Smokeless tobacco: Never  Vaping Use   Vaping Use: Never used  Substance and Sexual Activity   Alcohol use: Never   Drug use: No   Sexual activity: Not on file  Other Topics Concern   Not on file  Social History Narrative   Not on file   Social Determinants of Health   Financial Resource Strain: Not on file  Food Insecurity: Not on file  Transportation Needs: Not on file  Physical Activity: Not on file  Stress: Not on file  Social Connections: Not on file  Intimate Partner Violence: Not on file     Constitutional: Denies fever, malaise, fatigue, headache or abrupt weight changes.  Respiratory: Denies difficulty breathing, shortness of breath, cough or sputum production.   Cardiovascular: Denies chest pain, chest tightness, palpitations or swelling in the hands or feet.  Gastrointestinal: Pt reports RLQ abdominal pain. Denies bloating, constipation, diarrhea or blood in the stool.  GU: Pt reports dysuria. Denies urgency, frequency, burning sensation, blood in urine, odor or discharge. Skin: Denies redness, rashes, lesions or ulcercations.   No other specific complaints in a complete review of systems (except as listed in HPI  above).  Objective:   Physical Exam  BP (!) 99/50 (BP Location: Right Arm, Patient Position: Sitting, Cuff Size: Large)   Pulse 85   Temp (!) 97.5 F (36.4 C) (Temporal)   Resp 17   Ht 4\' 11"  (1.499 m)   Wt 182 lb (82.6 kg)   SpO2 99%   BMI 36.76 kg/m   Wt Readings from Last 3 Encounters:  03/23/21 183 lb 13.8 oz (83.4 kg)  03/13/21 184 lb (83.5 kg)  02/25/21 180 lb (81.6 kg)    General: Appears their stated age, obese, in NAD. Skin: Warm, dry and intact. No rashes noted. HEENT: Head: normal shape and size; Eyes: sclera white and EOMs intact;  Cardiovascular: Normal rate and rhythm. S1,S2 noted.  No murmur, rubs or gallops noted. No JVD or BLE edema.  Pulmonary/Chest: Normal effort  and positive vesicular breath sounds. No respiratory distress. No wheezes, rales or ronchi noted.  Abdomen: Soft and mildly tender in the RLQ. Normal bowel sounds.  Musculoskeletal: Gait slow and steady with use of rolling walker. Neurological: Alert and oriented.    BMET    Component Value Date/Time   NA 137 03/23/2021 1916   K 3.8 03/23/2021 1916   CL 101 03/23/2021 1916   CO2 28 03/23/2021 1916   GLUCOSE 103 (H) 03/23/2021 1916   BUN 19 03/23/2021 1916   CREATININE 0.99 03/23/2021 1916   CREATININE 0.87 08/13/2020 1129   CALCIUM 9.5 03/23/2021 1916   GFRNONAA 59 (L) 03/23/2021 1916   GFRNONAA 65 08/13/2020 1129   GFRAA 76 08/13/2020 1129    Lipid Panel     Component Value Date/Time   CHOL 238 (H) 02/26/2020 1130   TRIG 84 02/26/2020 1130   HDL 60 02/26/2020 1130   CHOLHDL 4.0 02/26/2020 1130   VLDL 18.4 02/08/2013 1638   LDLCALC 159 (H) 02/26/2020 1130    CBC    Component Value Date/Time   WBC 4.8 03/23/2021 1916   RBC 3.94 03/23/2021 1916   HGB 11.7 (L) 03/23/2021 1916   HCT 36.6 03/23/2021 1916   PLT 237 03/23/2021 1916   MCV 92.9 03/23/2021 1916   MCH 29.7 03/23/2021 1916   MCHC 32.0 03/23/2021 1916   RDW 13.8 03/23/2021 1916   LYMPHSABS 2.5 02/12/2021 0209    MONOABS 0.6 02/12/2021 0209   EOSABS 0.2 02/12/2021 0209   BASOSABS 0.0 02/12/2021 0209    Hgb A1C No results found for: HGBA1C         Assessment & Plan:   ER follow-up for UTI, Pelvic/Colonic Mass:  ER notes, labs and imaging reviewed She will continue antibiotics as prescribed Push fluids She has an initial evaluation with oncology tomorrow Referral to GI for diagnostic colonoscopy Continue current meds  Return precautions discussed Webb Silversmith, NP This visit occurred during the SARS-CoV-2 public health emergency.  Safety protocols were in place, including screening questions prior to the visit, additional usage of staff PPE, and extensive cleaning of exam room while observing appropriate contact time as indicated for disinfecting solutions.

## 2021-03-26 ENCOUNTER — Inpatient Hospital Stay: Payer: Medicare Other | Attending: Obstetrics and Gynecology | Admitting: Obstetrics and Gynecology

## 2021-03-26 ENCOUNTER — Inpatient Hospital Stay: Payer: Medicare Other

## 2021-03-26 VITALS — BP 111/69 | HR 85 | Temp 97.6°F | Resp 17 | Wt 182.0 lb

## 2021-03-26 DIAGNOSIS — N1831 Chronic kidney disease, stage 3a: Secondary | ICD-10-CM | POA: Diagnosis not present

## 2021-03-26 DIAGNOSIS — F338 Other recurrent depressive disorders: Secondary | ICD-10-CM | POA: Diagnosis not present

## 2021-03-26 DIAGNOSIS — Z6836 Body mass index (BMI) 36.0-36.9, adult: Secondary | ICD-10-CM | POA: Insufficient documentation

## 2021-03-26 DIAGNOSIS — R1900 Intra-abdominal and pelvic swelling, mass and lump, unspecified site: Secondary | ICD-10-CM | POA: Diagnosis not present

## 2021-03-26 DIAGNOSIS — Z7982 Long term (current) use of aspirin: Secondary | ICD-10-CM | POA: Diagnosis not present

## 2021-03-26 DIAGNOSIS — I700 Atherosclerosis of aorta: Secondary | ICD-10-CM | POA: Insufficient documentation

## 2021-03-26 DIAGNOSIS — I129 Hypertensive chronic kidney disease with stage 1 through stage 4 chronic kidney disease, or unspecified chronic kidney disease: Secondary | ICD-10-CM | POA: Insufficient documentation

## 2021-03-26 DIAGNOSIS — R569 Unspecified convulsions: Secondary | ICD-10-CM | POA: Insufficient documentation

## 2021-03-26 DIAGNOSIS — F419 Anxiety disorder, unspecified: Secondary | ICD-10-CM | POA: Diagnosis not present

## 2021-03-26 DIAGNOSIS — R1031 Right lower quadrant pain: Secondary | ICD-10-CM | POA: Diagnosis not present

## 2021-03-26 DIAGNOSIS — M4807 Spinal stenosis, lumbosacral region: Secondary | ICD-10-CM | POA: Diagnosis not present

## 2021-03-26 DIAGNOSIS — E7800 Pure hypercholesterolemia, unspecified: Secondary | ICD-10-CM | POA: Diagnosis not present

## 2021-03-26 DIAGNOSIS — R32 Unspecified urinary incontinence: Secondary | ICD-10-CM | POA: Diagnosis not present

## 2021-03-26 DIAGNOSIS — M4800 Spinal stenosis, site unspecified: Secondary | ICD-10-CM | POA: Diagnosis not present

## 2021-03-26 DIAGNOSIS — Z79899 Other long term (current) drug therapy: Secondary | ICD-10-CM | POA: Insufficient documentation

## 2021-03-26 DIAGNOSIS — K5900 Constipation, unspecified: Secondary | ICD-10-CM | POA: Insufficient documentation

## 2021-03-26 NOTE — Progress Notes (Signed)
Gynecologic Oncology Consult Visit   Referring Provider: Rudene Re, MD Webb Silversmith NP  Chief Concern: Pelvic mass  Subjective:  Chelsea [name] is a 75 y.o. G3P3 female who is seen in consultation from Dr. Alfred Levins and Ms. Bailey for evaluation colonic/vaginal cuff pelvic mass. She was see in the ER on 03/24/2021 for abdominal pain.   Patient describes that she has been having pain in her abdomen for at least 2 weeks.  The pain comes at random times and she has not been able to associate with anything.  She describes the pain is mostly on the right side of her abdomen but she feels that is worse on the right lower part.  She describes the pain as sharp.  She reports that she is incontinent of urine which is a chronic issue for her.  She was diagnosed and treated with a UTI  RUQ [place] 03/23/2021 IMPRESSION: 2.3 cm stone.  Cholelithiasis with no acute cholecystitis or choledocholithiasis.  CT abdomen 03/24/2021 Reproductive: Status post hysterectomy. There is a lobulated mass between the posterior wall of bladder and rectum which appears to arise off the vaginal cuff. This measures 3.7 x 3.4 by 3.4 cm. This is indeterminate and difficult to characterize reflecting lack of IV contrast material.   IMPRESSION: 1. Marked bladder wall thickening with surrounding soft tissue stranding is noted. Findings are concerning for cystitis. 2. There is a lobulated mass between the posterior wall of bladder and rectum which appears to arise off the vaginal cuff. This is indeterminate and difficult to characterize reflecting lack of IV contrast material. Suggest initial workup with either pelvic sonogram or CT of the pelvis with IV and oral contrast. 3. Nonobstructing left renal calculus. 4. Left sacroiliitis. 5. Lumbar spondylosis. 6. Aortic atherosclerosis.  CT Pelvic 03/24/2021 IMPRESSION: 1. Abnormal eccentric soft tissue of the right cecum, concerning for colon carcinoma. Colonoscopy  recommended. 2. Low attenuation mass in the region of the vaginal cuff is again noted. Unfortunately, the intravenous contrast is not particularly helpful in this case. Pelvic ultrasound or pelvic MRI with and without contrast may provide more information. 3. Diffuse urinary bladder wall thickening with mild bilateral hydroureter, possibly cystitis.  [place] 03/24/2021 IMPRESSION: 1. Lobulated solid 3.8 cm mass confirmed by ultrasound. This is most compatible with a neoplasm but remains indeterminate as to the origin as the epicenter seems to be outside of both the vaginal cuff and the adjacent colon, while the lesion appears inseparable from both. 2. Extensive echogenic debris within the distended urinary bladder.  She was referred to gyn oncology to evaluate the pelvic mass and to GI for diagnostic colonoscopy. Tumor markers not obtained. She has had night sweats. Constipation since February, worse recently. Last colonoscopy 3 years ago. Denies history of abnormal pap. Hysterectomy for benign disease. She says she had a BSO and it sounds like she was menopausal after her surgery.   Problem List: Patient Active Problem List   Diagnosis Date Noted   Stage 3a chronic kidney disease ([place]) 03/25/2021   Aortic atherosclerosis ([place]) 03/25/2021   Urinary incontinence, mixed 03/25/2021   Class 2 severe obesity due to excess calories with serious comorbidity and body mass index (BMI) of 37.0 to 37.9 in adult [facility]) 03/16/2021   Cervical myelopathy ([place]) 01/28/2021   Mixed incontinence 01/28/2021   Right arm numbness 08/08/2020   Right shoulder pain 08/08/2020   Chronic anemia 09/26/2018   Depression, major, recurrent, in partial remission ([place]) 01/11/2018   Pure hypercholesterolemia 01/11/2018  Posterior vitreous detachment, right 11/26/2014   GAD (generalized anxiety disorder) 05/14/2011   Essential hypertension 02/06/2011   Spinal stenosis of lumbosacral region 02/06/2011    Past Medical  History: Past Medical History:  Diagnosis Date   Anxiety    Arthritis    Depression    Hyperlipidemia    Hypertension    Seizures ([place])    Spinal stenosis    Ulcer    Urinary incontinence     Past Surgical History: Past Surgical History:  Procedure Laterality Date   ABDOMINAL HYSTERECTOMY     BACK SURGERY     due to polio   BREAST BIOPSY Bilateral    neg   BREAST BIOPSY Right 2011   neg/stereo   CARPAL TUNNEL RELEASE     CATARACT EXTRACTION Right 2020    Past Gynecologic History:  Post menopausal History of Abnormal pap: no, no abnormalities Last pap:  at birth of her last child History of STDs: The patient denies history of sexually transmitted disease. Contraception: none- post menopausal Sexually active: no  OB History:  G3P3- vaginal deliveries OB History  No obstetric history on file.    Family History: Family History  Problem Relation Age of Onset   Arthritis Mother    Heart disease Mother    Stroke Mother    Hypertension Mother    COPD Mother    Sudden death Sister    Other Father        unknown medical history   Breast cancer Neg Hx     Social History: Social History   Socioeconomic History   Marital status: Divorced    Spouse name: Not on file   Number of children: Not on file   Years of education: Not on file   Highest education level: Not on file  Occupational History   Not on file  Tobacco Use   Smoking status: Never   Smokeless tobacco: Never  Vaping Use   Vaping Use: Never used  Substance and Sexual Activity   Alcohol use: Never   Drug use: No   Sexual activity: Not on file  Other Topics Concern   Not on file  Social History Narrative   Not on file   Social Determinants of Health   Financial Resource Strain: Not on file  Food Insecurity: Not on file  Transportation Needs: Not on file  Physical Activity: Not on file  Stress: Not on file  Social Connections: Not on file  Intimate Partner Violence: Not on file     Allergies: Allergies  Allergen Reactions   Penicillins     "Everything turned black"    Current Medications: Current Outpatient Medications  Medication Sig Dispense Refill   acetaminophen (TYLENOL) 500 MG tablet Take 500 mg by mouth every 6 (six) hours as needed.     ALPRAZolam (XANAX) 0.5 MG tablet Take 1 tablet (0.5 mg total) by mouth 2 (two) times daily as needed for anxiety. 60 tablet 2   aspirin 325 MG tablet Take 325 mg by mouth daily.     atorvastatin (LIPITOR) 40 MG tablet Take 1 tablet (40 mg total) by mouth daily. 90 tablet 3   busPIRone (BUSPAR) 5 MG tablet Take 1 tablet (5 mg total) by mouth 3 (three) times daily. 90 tablet 2   cephALEXin (KEFLEX) 500 MG capsule Take 1 capsule (500 mg total) by mouth 3 (three) times daily for 7 days. 21 capsule 0   diclofenac Sodium (VOLTAREN) 1 % GEL Apply 4 g topically  4 (four) times daily. 4 g 0   FLUoxetine (PROZAC) 20 MG capsule Take 3 capsules (60 mg total) by mouth every morning. (Patient taking differently: Take 20 mg by mouth as needed.) 180 capsule 0   gabapentin (NEURONTIN) 100 MG capsule Take 2 capsules (200 mg total) by mouth 2 (two) times daily. 360 capsule 1   lidocaine (LIDODERM) 5 % Place 1 patch onto the skin daily. Remove & Discard patch within 12 hours or as directed by MD 1 patch 0   oxyCODONE-acetaminophen (PERCOCET) 5-325 MG tablet Take 1 tablet by mouth every 4 (four) hours as needed. 12 tablet 0   polyethylene glycol powder (GLYCOLAX/MIRALAX) 17 GM/SCOOP powder Take 17 g by mouth daily. 3350 g 1   triamterene-hydrochlorothiazide (MAXZIDE-25) 37.5-25 MG tablet TAKE 1 TABLET BY MOUTH EVERY DAY 90 tablet 1   Cyanocobalamin (VITAMIN B 12 PO) Take 500 mcg by mouth daily. (Patient not taking: Reported on 03/26/2021)     No current facility-administered medications for this visit.    Review of Systems General: negative for fevers, changes in weight . Positive for night sweats Skin: negative for changes in moles or  sores or rash Eyes: negative for changes in vision HEENT: negative for change in hearing, tinnitus, voice changes Pulmonary: negative for dyspnea, orthopnea, productive cough, wheezing Cardiac: negative for palpitations, pain Gastrointestinal: negative for nausea, vomiting, diarrhea, hematemesis, hematochezia. Positive for constipation Genitourinary/Sexual: positive for uti, on keflex. Urinary frequency Ob/Gyn:  negative for abnormal bleeding, or pain Musculoskeletal: negative for pain, joint pain, back pain Hematology: negative for easy bruising, abnormal bleeding Neurologic/Psych: negative for headaches, seizures, paralysis, weakness, numbness   Objective:  Physical Examination:  BP 111/69   Pulse 85   Temp 97.6 F (36.4 C)   Resp 17   Wt 182 lb (82.6 kg)   SpO2 94%   BMI 36.76 kg/m    ECOG Performance Status: 3 - Symptomatic, >50% confined to bed  GENERAL: Obese female, laying on exam table.  HEENT:  eck supple with midline trachea. Thyroid without masses.  NODES:  No cervical, supraclavicular, axillary, or inguinal lymphadenopathy palpated.  LUNGS:  Clear to auscultation bilaterally.  No wheezes or rhonchi. HEART:  Regular rate and rhythm. No murmur appreciated. ABDOMEN:  Soft, nontender.   MSK:  No focal spinal tenderness to palpation.  EXTREMITIES:  1+ edema bilaterally SKIN:  Clear with no obvious rashes or skin changes.  NEURO:  Nonfocal. Well oriented.  Appropriate affect.  Pelvic: Exam chaperoned by CMA EGBUS: no lesions Cervix: surgically absent Vagina: no lesions, no discharge or bleeding - retraction of the upper vagina Uterus: surgically absent Rectovaginal: confirmatory   Lab Review Labs on site today: Lab Results  Component Value Date   WBC 4.8 03/23/2021   HGB 11.7 (L) 03/23/2021   HCT 36.6 03/23/2021   MCV 92.9 03/23/2021   PLT 237 03/23/2021     Chemistry      Component Value Date/Time   NA 137 03/23/2021 1916   K 3.8 03/23/2021 1916   CL  101 03/23/2021 1916   CO2 28 03/23/2021 1916   BUN 19 03/23/2021 1916   CREATININE 0.99 03/23/2021 1916   CREATININE 0.87 08/13/2020 1129      Component Value Date/Time   CALCIUM 9.5 03/23/2021 1916   ALKPHOS 69 03/23/2021 1916   AST 15 03/23/2021 1916   ALT 12 03/23/2021 1916   BILITOT 0.7 03/23/2021 1916     Preliminary urine culture: CULTURE REINCUBATED FOR BETTER GROWTH  60,000 COLONIES/mL ENTEROCOCCUS FAECALIS   Urine: +WBC; many bacteria.   Lipase elevated 78   Radiologic Imaging: As per interval history       Assessment:  Chelsea [name] is a 75 y.o. female diagnosed with abdominal pain and colonic/vaginal cuff mass.  Elevated lipase  UTI   Cholelithiasis  Severe cervical stenosis, symptomatic  Medical co-morbidities complicating care: h/o seizures, HTN, poor performance status Plan:   Problem List Items Addressed This Visit   None Visit Diagnoses     Pelvic mass    -  Primary   Relevant Orders   CA 125   CEA       Obtain CEA and CA125; abn noticed signed and sent to scan. GI referral and colonoscopy results pending. Follow up in clinic once completed. The patient's diagnosis, an outline of the further diagnostic and laboratory studies which will be required, the recommendation, and alternatives were discussed.  All questions were answered to the patient's satisfaction.  A total of >80 minutes were spent with the patient/family today; >50% was spent in education, counseling and coordination of care for abdominal pain and colonic/vaginal cuff mass.  Recommend that she follow up with her PCP and other medical team members regarding management of her other medical issues including UTI, cholelithiasis, and elevated lipase.   Verlon Au, NP  I personally had a face to face interaction and evaluated the patient jointly with the NP, Ms. Beckey Rutter.  I have reviewed her history and available records and have performed the key portions of the  physical exam including general, abdominal exam, pelvic exam with my findings confirming those documented above by the APP.  I have discussed the case with the APP and the patient.  I agree with the above documentation, assessment and plan which was fully formulated by me.  Counseling was completed by me.   I personally saw the patient and performed a substantive portion of this encounter in conjunction with the listed APP as documented above. I called Dr. Izora Ribas and recommended that she proceed with surgery for spinal stenosis given that she is so symptomatic.   Zaydee Aina Gaetana Michaelis, MD       CC:  Alfred Levins, [place], [address] [place],  [place] [address] [place] NP

## 2021-03-27 LAB — CA 125: Cancer Antigen (CA) 125: 9.7 U/mL (ref 0.0–38.1)

## 2021-03-27 LAB — CEA: CEA: 3.4 ng/mL (ref 0.0–4.7)

## 2021-03-27 LAB — URINE CULTURE: Culture: >=100000 — AB

## 2021-03-28 ENCOUNTER — Telehealth: Payer: Self-pay

## 2021-03-28 NOTE — Telephone Encounter (Signed)
Messages have been sent to [facility] to assist in coordinating prompt colonoscopy.

## 2021-03-31 ENCOUNTER — Encounter: Payer: Self-pay | Admitting: Internal Medicine

## 2021-04-02 ENCOUNTER — Other Ambulatory Visit: Payer: Self-pay

## 2021-04-02 DIAGNOSIS — K6389 Other specified diseases of intestine: Secondary | ICD-10-CM

## 2021-04-02 DIAGNOSIS — G4733 Obstructive sleep apnea (adult) (pediatric): Secondary | ICD-10-CM | POA: Insufficient documentation

## 2021-04-02 DIAGNOSIS — Z87898 Personal history of other specified conditions: Secondary | ICD-10-CM | POA: Insufficient documentation

## 2021-04-02 DIAGNOSIS — R600 Localized edema: Secondary | ICD-10-CM | POA: Insufficient documentation

## 2021-04-02 HISTORY — DX: Obstructive sleep apnea (adult) (pediatric): G47.33

## 2021-04-02 MED ORDER — SUPREP BOWEL PREP KIT 17.5-3.13-1.6 GM/177ML PO SOLN: 1.0000 | ORAL | 0 refills | Status: DC

## 2021-04-02 NOTE — Telephone Encounter (Signed)
Ok to schedule a virtual appt to discuss her concerns

## 2021-04-03 ENCOUNTER — Encounter: Payer: Self-pay | Admitting: Gastroenterology

## 2021-04-03 ENCOUNTER — Telehealth: Payer: Self-pay

## 2021-04-03 NOTE — Telephone Encounter (Signed)
Patient left a voicemail and states a ball has developed on her stomach after drinking a lot of liquids. She has a colonoscopy scheduled for 04/04/21

## 2021-04-04 ENCOUNTER — Ambulatory Visit
Admission: RE | Admit: 2021-04-04 | Discharge: 2021-04-04 | Disposition: A | Discharge status: Home / Self Care | Payer: Medicare Other | Source: Ambulatory Visit | Attending: Gastroenterology | Admitting: Gastroenterology

## 2021-04-04 ENCOUNTER — Ambulatory Visit: Payer: Medicare Other | Admitting: Anesthesiology

## 2021-04-04 ENCOUNTER — Other Ambulatory Visit: Payer: Self-pay | Admitting: Family Medicine

## 2021-04-04 ENCOUNTER — Encounter: Payer: Self-pay | Admitting: Gastroenterology

## 2021-04-04 ENCOUNTER — Encounter
Admission: RE | Disposition: A | Discharge status: Home / Self Care | Payer: Self-pay | Source: Ambulatory Visit | Attending: Gastroenterology

## 2021-04-04 DIAGNOSIS — K6389 Other specified diseases of intestine: Secondary | ICD-10-CM

## 2021-04-04 DIAGNOSIS — R935 Abnormal findings on diagnostic imaging of other abdominal regions, including retroperitoneum: Secondary | ICD-10-CM | POA: Diagnosis not present

## 2021-04-04 DIAGNOSIS — Z79899 Other long term (current) drug therapy: Secondary | ICD-10-CM | POA: Insufficient documentation

## 2021-04-04 DIAGNOSIS — K5730 Diverticulosis of large intestine without perforation or abscess without bleeding: Secondary | ICD-10-CM | POA: Insufficient documentation

## 2021-04-04 DIAGNOSIS — D120 Benign neoplasm of cecum: Secondary | ICD-10-CM | POA: Insufficient documentation

## 2021-04-04 DIAGNOSIS — Z880 Allergy status to penicillin: Secondary | ICD-10-CM | POA: Diagnosis not present

## 2021-04-04 DIAGNOSIS — F411 Generalized anxiety disorder: Secondary | ICD-10-CM

## 2021-04-04 DIAGNOSIS — R948 Abnormal results of function studies of other organs and systems: Secondary | ICD-10-CM | POA: Diagnosis present

## 2021-04-04 DIAGNOSIS — K648 Other hemorrhoids: Secondary | ICD-10-CM | POA: Diagnosis not present

## 2021-04-04 DIAGNOSIS — Z7982 Long term (current) use of aspirin: Secondary | ICD-10-CM | POA: Diagnosis not present

## 2021-04-04 HISTORY — PX: COLONOSCOPY WITH PROPOFOL: SHX5780

## 2021-04-04 SURGERY — COLONOSCOPY WITH PROPOFOL
Anesthesia: General

## 2021-04-04 MED ORDER — SODIUM CHLORIDE 0.9 % IV SOLN: INTRAVENOUS | Status: DC

## 2021-04-04 MED ORDER — PROPOFOL 10 MG/ML IV BOLUS
INTRAVENOUS | Status: DC | PRN
  Administered 2021-04-04: 40 mg via INTRAVENOUS
  Administered 2021-04-04: 10 mg via INTRAVENOUS
  Administered 2021-04-04: 20 mg via INTRAVENOUS

## 2021-04-04 MED ORDER — PHENYLEPHRINE HCL (PRESSORS) 10 MG/ML IV SOLN
INTRAVENOUS | Status: DC | PRN
  Administered 2021-04-04: 100 ug via INTRAVENOUS

## 2021-04-04 MED ORDER — PROPOFOL 500 MG/50ML IV EMUL
INTRAVENOUS | Status: DC | PRN
  Administered 2021-04-04: 160 ug/kg/min via INTRAVENOUS

## 2021-04-04 MED ORDER — PROPOFOL 500 MG/50ML IV EMUL
INTRAVENOUS | Status: AC
  Filled 2021-04-04: qty 50

## 2021-04-04 MED ORDER — PROPOFOL 10 MG/ML IV BOLUS
INTRAVENOUS | Status: AC
  Filled 2021-04-04: qty 20

## 2021-04-04 NOTE — Anesthesia Procedure Notes (Signed)
Date/Time: 04/04/2021 11:00 AM Performed by: Allean Found, CRNA Pre-anesthesia Checklist: Patient identified, Emergency Drugs available, Suction available, Patient being monitored and Timeout performed Oxygen Delivery Method: Nasal cannula Placement Confirmation: positive ETCO2

## 2021-04-04 NOTE — Anesthesia Postprocedure Evaluation (Signed)
Anesthesia Post Note  Patient: Chelsea [name]  Procedure(s) Performed: COLONOSCOPY WITH PROPOFOL  Patient location during evaluation: Endoscopy Anesthesia Type: General Level of consciousness: awake and alert Pain management: pain level controlled Vital Signs Assessment: post-procedure vital signs reviewed and stable Respiratory status: spontaneous breathing, nonlabored ventilation, respiratory function stable and patient connected to nasal cannula oxygen Cardiovascular status: blood pressure returned to baseline and stable Postop Assessment: no apparent nausea or vomiting Anesthetic complications: no   No notable events documented.   Last Vitals:  Vitals:   04/04/21 1158 04/04/21 1208  BP: 131/69 135/89  Pulse:    Resp:    Temp:    SpO2:      Last Pain:  Vitals:   04/04/21 1208  TempSrc:   PainSc: 0-No pain                 Precious Haws Cuthbert Turton

## 2021-04-04 NOTE — Op Note (Signed)
[facility] Gastroenterology Patient Name: Chelsea [name] Procedure Date: 04/04/2021 10:59 AM MRN: [id number] Account #: [id number] Date of Birth: September 03, 1946 Admit Type: Outpatient Age: 75 Room: [facility] ENDO ROOM 4 Gender: Female Note Status: Finalized Procedure:             Colonoscopy Indications:           Abnormal CT of the GI tract Providers:             Billy Turvey B. Bonna Gains MD, MD Medicines:             Monitored Anesthesia Care Complications:         No immediate complications. Procedure:             Pre-Anesthesia Assessment:                        - Prior to the procedure, a History and Physical was                         performed, and patient medications, allergies and                         sensitivities were reviewed. The patient's tolerance                         of previous anesthesia was reviewed.                        - The risks and benefits of the procedure and the                         sedation options and risks were discussed with the                         patient. All questions were answered and informed                         consent was obtained.                        - Patient identification and proposed procedure were                         verified prior to the procedure by the physician, the                         nurse, the anesthetist and the technician. The                         procedure was verified in the pre-procedure area in                         the procedure room in the endoscopy suite.                        - ASA Grade Assessment: III - A patient with severe                         systemic disease.                        -  After reviewing the risks and benefits, the patient                         was deemed in satisfactory condition to undergo the                         procedure.                        After obtaining informed consent, the colonoscope was                         passed under direct vision.  Throughout the procedure,                         the patient's blood pressure, pulse, and oxygen                         saturations were monitored continuously. The                         Colonoscope was introduced through the anus and                         advanced to the the cecum, identified by appendiceal                         orifice and ileocecal valve. The colonoscopy was                         performed with ease. The patient tolerated the                         procedure well. The quality of the bowel preparation                         was good. Findings:      The perianal and digital rectal examinations were normal.      A frond-like/villous non-obstructing large mass was found in the cecum.       The mass was partially circumferential (involving one-half of the lumen       circumference). No bleeding was present. Biopsies were taken with a cold       forceps for histology.      The exam was otherwise normal throughout the examined colon.      Terminal ileum was briefly intubated and appeared normal.      Diverticula were found in the sigmoid colon.      The rectum, sigmoid colon, descending colon, transverse colon and       ascending colon appeared normal.      Non-bleeding internal hemorrhoids were found during retroflexion. Impression:            - Rule out malignancy, tumor in the cecum. Biopsied.                        - Terminal ileum was briefly intubated and appeared                         normal.                        -  Diverticulosis in the sigmoid colon.                        - The rectum, sigmoid colon, descending colon,                         transverse colon and ascending colon are normal.                        - Non-bleeding internal hemorrhoids. Recommendation:        - Await pathology results.                        - Return to my office in 2 weeks.                        - Discharge patient to home.                        - High fiber diet.                         - Resume previous diet.                        - Continue present medications.                        - Return to primary care physician as previously                         scheduled.                        - The findings and recommendations were discussed with                         the patient.                        - The findings and recommendations were discussed with                         the patient's family. Procedure Code(s):     --- Professional ---                        [phone], Colonoscopy, flexible; with biopsy, single or                         multiple Diagnosis Code(s):     --- Professional ---                        D49.0, Neoplasm of unspecified behavior of digestive                         system                        K64.8, Other hemorrhoids                        R93.3, Abnormal findings on diagnostic imaging  of                         other parts of digestive tract CPT copyright 2019 American Medical Association. All rights reserved. The codes documented in this report are preliminary and upon coder review may  be revised to meet current compliance requirements.  Vonda Antigua, MD Margretta Sidle B. Bonna Gains MD, MD 04/04/2021 11:41:59 AM This report has been signed electronically. Number of Addenda: 0 Note Initiated On: 04/04/2021 10:59 AM Scope Withdrawal Time: 0 hours 19 minutes 46 seconds  Total Procedure Duration: 0 hours 27 minutes 3 seconds  Estimated Blood Loss:  Estimated blood loss: none.      [facility]

## 2021-04-04 NOTE — Telephone Encounter (Signed)
Requested medications are due for refill today yes  Requested medications are on the active medication list yes  Last refill 6/2  Last visit 03/2021  Future visit scheduled 04/11/21  Notes to clinic Not Delegated.

## 2021-04-04 NOTE — Progress Notes (Signed)
At the patient's request, I spoke to her daughter [name] and went over the results of her colonoscopy today.  We discussed the finding of a colon mass and that biopsies are pending at this time and this is to evaluate for cancer as well.  Both patient and daughter understand the need for close follow-up as well.  All their questions were answered to their satisfaction.

## 2021-04-04 NOTE — Anesthesia Preprocedure Evaluation (Signed)
Anesthesia Evaluation  Patient identified by MRN, date of birth, ID band Patient awake    Reviewed: Allergy & Precautions, H&P , NPO status , Patient's Chart, lab work & pertinent test results, reviewed documented beta blocker date and time   History of Anesthesia Complications Negative for: history of anesthetic complications  Airway Mallampati: II  TM Distance: >3 FB Neck ROM: full    Dental  (+) Teeth Intact, Dental Advidsory Given, Missing   Pulmonary neg pulmonary ROS,    Pulmonary exam normal breath sounds clear to auscultation       Cardiovascular Exercise Tolerance: Good hypertension, (-) angina(-) Past MI and (-) Cardiac Stents Normal cardiovascular exam(-) dysrhythmias + Valvular Problems/Murmurs  Rhythm:regular Rate:Normal     Neuro/Psych Seizures - (a long time ago), Well Controlled,  PSYCHIATRIC DISORDERS Anxiety Depression    GI/Hepatic negative GI ROS, Neg liver ROS,   Endo/Other  negative endocrine ROS  Renal/GU CRFRenal disease  negative genitourinary   Musculoskeletal   Abdominal   Peds  Hematology negative hematology ROS (+)   Anesthesia Other Findings Past Medical History: No date: Anxiety No date: Arthritis No date: Depression No date: Hyperlipidemia No date: Hypertension No date: Seizures (HCC) No date: Spinal stenosis No date: Ulcer No date: Urinary incontinence   Reproductive/Obstetrics negative OB ROS                             Anesthesia Physical Anesthesia Plan  ASA: 3  Anesthesia Plan: General   Post-op Pain Management:    Induction: Intravenous  PONV Risk Score and Plan: 3 and TIVA and Propofol infusion  Airway Management Planned: Natural Airway and Nasal Cannula  Additional Equipment:   Intra-op Plan:   Post-operative Plan:   Informed Consent: I have reviewed the patients History and Physical, chart, labs and discussed the procedure  including the risks, benefits and alternatives for the proposed anesthesia with the patient or authorized representative who has indicated his/her understanding and acceptance.     Dental Advisory Given  Plan Discussed with: Anesthesiologist, CRNA and Surgeon  Anesthesia Plan Comments:         Anesthesia Quick Evaluation

## 2021-04-04 NOTE — H&P (Signed)
Vonda Antigua, MD [address] [place], [place], [place], [id number], [place], [place], [place], [address] Phone: [phone]  Fax: [phone]  Primary Care Physician:  Jearld Fenton, NP   Pre-Procedure History & Physical: HPI:  Chelsea [name] is a 75 y.o. female is here for a colonoscopy.   Past Medical History:  Diagnosis Date   Anxiety    Arthritis    Depression    Hyperlipidemia    Hypertension    Seizures ([place])    Spinal stenosis    Ulcer    Urinary incontinence     Past Surgical History:  Procedure Laterality Date   ABDOMINAL HYSTERECTOMY     BACK SURGERY     due to polio   BREAST BIOPSY Bilateral    neg   BREAST BIOPSY Right 2011   neg/stereo   CARPAL TUNNEL RELEASE     CATARACT EXTRACTION Right 2020   EYE SURGERY      Prior to Admission medications   Medication Sig Start Date End Date Taking? Authorizing Provider  ALPRAZolam Duanne Moron) 0.5 MG tablet Take 1 tablet (0.5 mg total) by mouth 2 (two) times daily as needed for anxiety. 01/03/21  Yes Karamalegos, Devonne Doughty, DO  aspirin 325 MG tablet Take 325 mg by mouth daily.   Yes [provider]  atorvastatin (LIPITOR) 40 MG tablet Take 1 tablet (40 mg total) by mouth daily. 02/26/21  Yes Baity, Coralie Keens, NP  busPIRone (BUSPAR) 5 MG tablet Take 1 tablet (5 mg total) by mouth 3 (three) times daily. 03/13/21  Yes Jearld Fenton, NP  FLUoxetine (PROZAC) 20 MG capsule Take 3 capsules (60 mg total) by mouth every morning. Patient taking differently: Take 20 mg by mouth as needed. 02/27/21  Yes Jearld Fenton, NP  gabapentin (NEURONTIN) 100 MG capsule Take 2 capsules (200 mg total) by mouth 2 (two) times daily. 01/03/21  Yes Karamalegos, Devonne Doughty, DO  oxyCODONE-acetaminophen (PERCOCET) 5-325 MG tablet Take 1 tablet by mouth every 4 (four) hours as needed. 03/24/21  Yes Alfred Levins, [place], MD  acetaminophen (TYLENOL) 500 MG tablet Take 500 mg by mouth every 6 (six) hours as needed.     [provider]  Cyanocobalamin (VITAMIN B 12 PO) Take 500 mcg by mouth daily. Patient not taking: Reported on 03/26/2021    [provider]  diclofenac Sodium (VOLTAREN) 1 % GEL Apply 4 g topically 4 (four) times daily. 12/17/20   Kathrine Haddock, NP  lidocaine (LIDODERM) 5 % Place 1 patch onto the skin daily. Remove & Discard patch within 12 hours or as directed by MD 10/24/20   Max Sane, MD  Na Sulfate-K Sulfate-Mg Sulf (SUPREP BOWEL PREP KIT) 17.5-3.13-1.6 GM/177ML SOLN Take 1 kit by mouth as directed. 04/02/21   Virgel Manifold, MD  polyethylene glycol powder (GLYCOLAX/MIRALAX) 17 GM/SCOOP powder Take 17 g by mouth daily. 01/28/21   Jearld Fenton, NP  triamterene-hydrochlorothiazide (MAXZIDE-25) 37.5-25 MG tablet TAKE 1 TABLET BY MOUTH EVERY DAY 02/24/21   Olin Hauser, DO    Allergies as of 04/02/2021 - Review Complete 03/26/2021  Allergen Reaction Noted   Penicillins  02/06/2011    Family History  Problem Relation Age of Onset   Arthritis Mother    Heart disease Mother    Stroke Mother    Hypertension Mother    COPD Mother    Sudden death Sister    Other Father        unknown medical  history   Breast cancer Neg Hx     Social History   Socioeconomic History   Marital status: Divorced    Spouse name: Not on file   Number of children: Not on file   Years of education: Not on file   Highest education level: Not on file  Occupational History   Not on file  Tobacco Use   Smoking status: Never   Smokeless tobacco: Never  Vaping Use   Vaping Use: Never used  Substance and Sexual Activity   Alcohol use: Never   Drug use: No   Sexual activity: Not on file  Other Topics Concern   Not on file  Social History Narrative   Not on file   Social Determinants of Health   Financial Resource Strain: Not on file  Food Insecurity: Not on file  Transportation Needs: Not on file  Physical Activity: Not on file  Stress: Not on file  Social  Connections: Not on file  Intimate Partner Violence: Not on file    Review of Systems: See HPI, otherwise negative ROS  Physical Exam: Constitutional: General:   Alert,  Well-developed, well-nourished, pleasant and cooperative in NAD BP (!) 145/69   Pulse 79   Temp (!) 96.6 F (35.9 C) (Temporal)   Resp 16   Ht 4' 11" (1.499 m)   Wt 83 kg   SpO2 97%   BMI 36.96 kg/m   Head: Normocephalic, atraumatic.   Eyes:  Sclera clear, no icterus.   Conjunctiva pink.   Mouth:  No deformity or lesions, oropharynx pink & moist.  Neck:  Supple, trachea midline  Respiratory: Normal respiratory effort  Gastrointestinal:  Soft, non-tender and non-distended without masses, hepatosplenomegaly or hernias noted.  No guarding or rebound tenderness.     Cardiac: No clubbing or edema.  No cyanosis. Normal posterior tibial pedal pulses noted.  Lymphatic:  No significant cervical adenopathy.  Psych:  Alert and cooperative. Normal mood and affect.  Musculoskeletal:   Symmetrical without gross deformities. 5/5 Lower extremity strength bilaterally.  Skin: Warm. Intact without significant lesions or rashes. No jaundice.  Neurologic:  Face symmetrical, tongue midline, Normal sensation to touch;  grossly normal neurologically.  Psych:  Alert and oriented x3, Alert and cooperative. Normal mood and affect.  Impression/Plan: Chelsea [name] is here for a colonoscopy to be performed for  colon mass on CT scan. Pt reports having a colonoscopy 5 yrs ago in [place] and states she was told she had polyps. Reports having other colonoscopies prior to that and states she was having colonoscopies about every 5 yrs and none of the previous ones had polyps.  This colonoscopy was placed as an urgent referral from her referring physician due to abnormal CT scan report which I have reviewed.  Therefore, we did not have time to obtain her previous colonoscopy records and she was scheduled directly for a  colonoscopy by the staff due to urgent referral.  Risks, benefits, limitations, and alternatives regarding  colonoscopy have been reviewed with the patient.  Questions have been answered.  All parties agreeable.   Virgel Manifold, MD  04/04/2021, 10:55 AM

## 2021-04-04 NOTE — Transfer of Care (Signed)
Immediate Anesthesia Transfer of Care Note  Patient: Chelsea [name]  Procedure(s) Performed: COLONOSCOPY WITH PROPOFOL  Patient Location: Endoscopy Unit  Anesthesia Type:General  Level of Consciousness: drowsy  Airway & Oxygen Therapy: Patient Spontanous Breathing  Post-op Assessment: Report given to RN and Post -op Vital signs reviewed and stable  Post vital signs: Reviewed and stable  Last Vitals:  Vitals Value Taken Time  BP 100/54 04/04/21 1138  Temp    Pulse 70 04/04/21 1138  Resp 11 04/04/21 1138  SpO2 94 % 04/04/21 1138  Vitals shown include unvalidated device data.  Last Pain:  Vitals:   04/04/21 0911  TempSrc: Temporal  PainSc: 2          Complications: No notable events documented.

## 2021-04-08 ENCOUNTER — Encounter: Payer: Self-pay | Admitting: Gastroenterology

## 2021-04-08 DIAGNOSIS — R7989 Other specified abnormal findings of blood chemistry: Secondary | ICD-10-CM | POA: Diagnosis not present

## 2021-04-08 DIAGNOSIS — E871 Hypo-osmolality and hyponatremia: Secondary | ICD-10-CM | POA: Diagnosis present

## 2021-04-08 DIAGNOSIS — E7800 Pure hypercholesterolemia, unspecified: Secondary | ICD-10-CM | POA: Diagnosis present

## 2021-04-08 DIAGNOSIS — M4807 Spinal stenosis, lumbosacral region: Secondary | ICD-10-CM | POA: Diagnosis not present

## 2021-04-08 DIAGNOSIS — G959 Disease of spinal cord, unspecified: Secondary | ICD-10-CM | POA: Diagnosis present

## 2021-04-08 DIAGNOSIS — M4802 Spinal stenosis, cervical region: Secondary | ICD-10-CM | POA: Diagnosis present

## 2021-04-08 DIAGNOSIS — I10 Essential (primary) hypertension: Secondary | ICD-10-CM | POA: Diagnosis present

## 2021-04-08 DIAGNOSIS — G4700 Insomnia, unspecified: Secondary | ICD-10-CM | POA: Diagnosis present

## 2021-04-08 LAB — SURGICAL PATHOLOGY

## 2021-04-09 ENCOUNTER — Other Ambulatory Visit: Payer: Self-pay | Admitting: Nurse Practitioner

## 2021-04-09 ENCOUNTER — Encounter: Payer: Self-pay | Admitting: Obstetrics and Gynecology

## 2021-04-09 DIAGNOSIS — R1900 Intra-abdominal and pelvic swelling, mass and lump, unspecified site: Secondary | ICD-10-CM

## 2021-04-09 NOTE — Progress Notes (Signed)
Spoke with Dr. Bonna Gains and Dr. Theora Gianotti. Recommendation based on pathology and colonoscopy findings with normal tumor markers is to discuss findings at [facility] with review of pathology and colonoscopy findings. Order entered. Dr. Bonna Gains to discuss pathology with patient.

## 2021-04-11 ENCOUNTER — Other Ambulatory Visit: Payer: Self-pay

## 2021-04-11 ENCOUNTER — Telehealth: Payer: Self-pay | Admitting: Internal Medicine

## 2021-04-11 MED ORDER — BUSPIRONE HCL 5 MG PO TABS: 5.0000 mg | ORAL_TABLET | Freq: Three times a day (TID) | ORAL | 1 refills | Status: DC

## 2021-04-14 ENCOUNTER — Ambulatory Visit: Payer: Self-pay | Admitting: Urology

## 2021-04-17 DIAGNOSIS — I10 Essential (primary) hypertension: Secondary | ICD-10-CM | POA: Diagnosis not present

## 2021-04-17 DIAGNOSIS — R339 Retention of urine, unspecified: Secondary | ICD-10-CM | POA: Diagnosis not present

## 2021-04-21 ENCOUNTER — Telehealth: Payer: Self-pay

## 2021-04-21 NOTE — Telephone Encounter (Signed)
RN called patient to schedule follow up from palliative care NP.  Pt had back surgery last week and is now at [facility] for rehab.  Will call patient once discharged.

## 2021-04-28 ENCOUNTER — Other Ambulatory Visit: Payer: Self-pay

## 2021-04-28 ENCOUNTER — Ambulatory Visit: Payer: Medicare Other | Admitting: Physician Assistant

## 2021-04-28 ENCOUNTER — Encounter: Payer: Self-pay | Admitting: Urology

## 2021-04-28 ENCOUNTER — Other Ambulatory Visit: Payer: Self-pay | Admitting: Nurse Practitioner

## 2021-04-28 ENCOUNTER — Ambulatory Visit (INDEPENDENT_AMBULATORY_CARE_PROVIDER_SITE_OTHER): Payer: PRIVATE HEALTH INSURANCE | Admitting: Urology

## 2021-04-28 VITALS — BP 118/77 | HR 70 | Ht 59.0 in | Wt 179.0 lb

## 2021-04-28 DIAGNOSIS — K639 Disease of intestine, unspecified: Secondary | ICD-10-CM | POA: Insufficient documentation

## 2021-04-28 DIAGNOSIS — R339 Retention of urine, unspecified: Secondary | ICD-10-CM | POA: Diagnosis not present

## 2021-04-28 LAB — BLADDER SCAN AMB NON-IMAGING

## 2021-04-28 NOTE — Progress Notes (Signed)
Afternoon follow-up  Patient returned to clinic this afternoon for repeat PVR. She has been able to urinate. PVR 365m. She reports bothersome constipation today. She was prompted to void again with repeat PVR 1650m She was able to have a BM at that time.  Results for orders placed or performed in visit on 04/28/21  Bladder Scan (Post Void Residual) in office  Result Value Ref Range   Scan Result 36025m  Scan Result 167m66m  Voiding trial passed. Will plan for repeat PVR in 1 month and will consider resuming Myrbetriq '25mg'$  daily at that time if PVR remains low.  Follow up: Return in about 4 weeks (around 05/26/2021) for Repeat PVR.

## 2021-04-28 NOTE — Progress Notes (Signed)
Catheter Removal  Patient is present today for a catheter removal.  9 ml of water was drained from the balloon. A 16 FR foley cath was removed from the bladder no complications were noted . Patient tolerated well.  Performed by: Kerman Passey, RMA  Follow up/ Additional notes: Return this afternoon with Samantha PA at 2:30 pm for PVR

## 2021-04-28 NOTE — Progress Notes (Signed)
Referral to surgery placed to discuss management of colon mass, possible colectomy. Should patient undergo surgery, I can coordinate with Dr. Theora Gianotti for possible intraoperative assessment of gyn findings. Call placed to patient to discuss. No answer. Left voicemail.

## 2021-04-28 NOTE — Progress Notes (Signed)
04/28/2021 8:16 AM   Chelsea [name] 11/11/1945 [id number]  Referring provider: Jearld Fenton, NP [place],  [place] [address]  Chief Complaint  Patient presents with   Urinary Retention    HPI: Patient was here for a trial of voiding.  She had surgery July 5.  She uses a walker.  She had neck surgery.  She normally voids on her own and has urge and bladder but does not wear pads.  No history of bladder surgery or bladder infections  She saw my partner in May was given Myrbetriq 25 mg for overactive bladder.  She was scanned at the time for 300 mL but she did not void for many hours  Urine culture June 2022 negative   PMH: Past Medical History:  Diagnosis Date   Anxiety    Arthritis    Depression    Hyperlipidemia    Hypertension    Seizures ([place])    Spinal stenosis    Ulcer    Urinary incontinence     Surgical History: Past Surgical History:  Procedure Laterality Date   ABDOMINAL HYSTERECTOMY     BACK SURGERY     due to polio   BREAST BIOPSY Bilateral    neg   BREAST BIOPSY Right 2011   neg/stereo   CARPAL TUNNEL RELEASE     CATARACT EXTRACTION Right 2020   COLONOSCOPY WITH PROPOFOL N/A 04/04/2021   Procedure: COLONOSCOPY WITH PROPOFOL;  Surgeon: Virgel Manifold, MD;  Location: [facility] ENDOSCOPY;  Service: Endoscopy;  Laterality: N/A;   EYE SURGERY      Home Medications:  Allergies as of 04/28/2021       Reactions   Penicillins    "Everything turned black"        Medication List        Accurate as of April 28, 2021  8:16 AM. If you have any questions, ask your nurse or doctor.          acetaminophen 500 MG tablet Commonly known as: TYLENOL Take 500 mg by mouth every 6 (six) hours as needed.   ALPRAZolam 0.5 MG tablet Commonly known as: XANAX TAKE 1 TABLET BY MOUTH 2 TIMES DAILY AS NEEDED FOR ANXIETY.   aspirin 325 MG tablet Take 325 mg by mouth daily.   atorvastatin 40 MG tablet Commonly known as: LIPITOR Take 1 tablet  (40 mg total) by mouth daily.   busPIRone 5 MG tablet Commonly known as: BUSPAR Take 1 tablet (5 mg total) by mouth 3 (three) times daily.   diclofenac Sodium 1 % Gel Commonly known as: VOLTAREN Apply 4 g topically 4 (four) times daily.   FLUoxetine 20 MG capsule Commonly known as: PROZAC Take 3 capsules (60 mg total) by mouth every morning. What changed:  how much to take when to take this reasons to take this   gabapentin 100 MG capsule Commonly known as: NEURONTIN Take 2 capsules (200 mg total) by mouth 2 (two) times daily.   lidocaine 5 % Commonly known as: LIDODERM Place 1 patch onto the skin daily. Remove & Discard patch within 12 hours or as directed by MD   oxyCODONE-acetaminophen 5-325 MG tablet Commonly known as: Percocet Take 1 tablet by mouth every 4 (four) hours as needed.   polyethylene glycol powder 17 GM/SCOOP powder Commonly known as: GLYCOLAX/MIRALAX Take 17 g by mouth daily.   Suprep Bowel Prep Kit 17.5-3.13-1.6 GM/177ML Soln Generic drug: Na Sulfate-K Sulfate-Mg Sulf Take 1 kit by  mouth as directed.   triamterene-hydrochlorothiazide 37.5-25 MG tablet Commonly known as: MAXZIDE-25 TAKE 1 TABLET BY MOUTH EVERY DAY   VITAMIN B 12 PO Take 500 mcg by mouth daily.        Allergies:  Allergies  Allergen Reactions   Penicillins     "Everything turned black"    Family History: Family History  Problem Relation Age of Onset   Arthritis Mother    Heart disease Mother    Stroke Mother    Hypertension Mother    COPD Mother    Sudden death Sister    Other Father        unknown medical history   Breast cancer Neg Hx     Social History:  reports that she has never smoked. She has never used smokeless tobacco. She reports that she does not drink alcohol and does not use drugs.  ROS:                                        Physical Exam: There were no vitals taken for this visit.  Constitutional:  Alert and oriented,  No acute distress. HEENT: [place] AT, moist mucus membranes.  Trachea midline, no masses. Cardiovascular: No clubbing, cyanosis, or edema. Respiratory: Normal respiratory effort, no increased work of breathing. GI: Abdomen is soft, nontender, nondistended, no abdominal masses GU: Neck brace Skin: No rashes, bruises or suspicious lesions. Lymph: No cervical or inguinal adenopathy. Neurologic: Grossly intact, no focal deficits, moving all 4 extremities. Psychiatric: Normal mood and affect.  Laboratory Data: Lab Results  Component Value Date   WBC 4.8 03/23/2021   HGB 11.7 (L) 03/23/2021   HCT 36.6 03/23/2021   MCV 92.9 03/23/2021   PLT 237 03/23/2021    Lab Results  Component Value Date   CREATININE 0.99 03/23/2021    No results found for: PSA  No results found for: TESTOSTERONE  No results found for: HGBA1C  Urinalysis    Component Value Date/Time   COLORURINE YELLOW (A) 03/24/2021 0009   APPEARANCEUR TURBID (A) 03/24/2021 0009   LABSPEC 1.015 03/24/2021 0009   PHURINE 6.0 03/24/2021 0009   GLUCOSEU NEGATIVE 03/24/2021 0009   HGBUR SMALL (A) 03/24/2021 0009   BILIRUBINUR NEGATIVE 03/24/2021 0009   BILIRUBINUR Negative 01/14/2021 1354   KETONESUR NEGATIVE 03/24/2021 0009   PROTEINUR 100 (A) 03/24/2021 0009   UROBILINOGEN 0.2 01/14/2021 1354   NITRITE NEGATIVE 03/24/2021 0009   LEUKOCYTESUR LARGE (A) 03/24/2021 0009    Pertinent Imaging: Chart reviewed.  Assessment & Plan: Trial of voiding and see nurse practitioner this afternoon.  Currently not taking the Myrbetriq  There are no diagnoses linked to this encounter.  No follow-ups on file.  Reece Packer, MD  [facility] [address] [place], [place] [address] [phone]

## 2021-04-28 NOTE — Addendum Note (Signed)
Addended by: Evelina Bucy on: 04/28/2021 02:51 PM   Modules accepted: Orders

## 2021-05-01 ENCOUNTER — Telehealth: Payer: Self-pay

## 2021-05-01 ENCOUNTER — Ambulatory Visit: Payer: Medicare Other | Admitting: Surgery

## 2021-05-01 NOTE — Telephone Encounter (Signed)
Noted that Chelsea [name] had cancelled her consult with surgeon. Called and spoke with daughter Chelsea [name], followed by call to Chelsea [name]. Explained at length the importance of having a consult with a surgeon to discuss the mass in her colon further. She was agreeable to having appointment rescheduled and prefers to wait until after her follow up with her neurosurgeon on 8/19. I have reached out to [facility] and they will contact her with the new date/time.

## 2021-05-08 ENCOUNTER — Telehealth: Payer: Self-pay | Admitting: Student

## 2021-05-08 NOTE — Telephone Encounter (Signed)
Spoke with patient and she stated that she has been discharged home from [facility] and was with her daughter currently.  Patient declined to schedule aalliative f/u visit at this time, she wanted to wait until after she had a f/u appointment with her surgeon on 05/23/21.  Will call her back the week of 8/22 to f/u with her.

## 2021-05-16 ENCOUNTER — Telehealth: Payer: Self-pay

## 2021-05-16 NOTE — Telephone Encounter (Signed)
Copied from [place] [id number]. Topic: General - Other >> May 16, 2021 12:30 PM Celene Kras wrote: Reason for CRM: Pt called stating that she is needing to speak with PCP regarding her recent surgery. Pt was very upset because no one had called her to check on her or to follow up. Attempted to get pt scheduled for a follow up appt and she became agitated with being asked if she had insurance. She stated "They looking for money already, but cant follow up with me." Please advise.     05/16/21 @ 2:13 pm:   I returned the pt's call and she did not answer. I left a message letting her know that Rollene Fare is not in office this week and verified that that may be the reason she hadn't heard from [place]... but I am not sure why the doctor that performed the surgery would not reach out to her. I let her know that Rollene Fare will return Monday and that if she changes her mind about a f/u visit, to call us back.

## 2021-05-20 ENCOUNTER — Other Ambulatory Visit: Payer: Self-pay | Admitting: Family Medicine

## 2021-05-20 DIAGNOSIS — R600 Localized edema: Secondary | ICD-10-CM

## 2021-05-20 DIAGNOSIS — I10 Essential (primary) hypertension: Secondary | ICD-10-CM

## 2021-05-20 NOTE — Telephone Encounter (Signed)
  Notes to clinic:  Review for future refills Medication filled on 05/07/2021 for 90 day supply not due yet    Requested Prescriptions  Pending Prescriptions Disp Refills   triamterene-hydrochlorothiazide (MAXZIDE-25) 37.5-25 MG tablet [Pharmacy Med Name: TRIAMTERENE-HCTZ 37.5-25 MG TB] 90 tablet 1    Sig: TAKE 1 TABLET BY MOUTH EVERY DAY     Cardiovascular: Diuretic Combos Passed - 05/20/2021 11:46 AM      Passed - K in normal range and within 360 days    Potassium  Date Value Ref Range Status  03/23/2021 3.8 3.5 - 5.1 mmol/L Final          Passed - Na in normal range and within 360 days    Sodium  Date Value Ref Range Status  03/23/2021 137 135 - 145 mmol/L Final          Passed - Cr in normal range and within 360 days    Creat  Date Value Ref Range Status  08/13/2020 0.87 0.60 - 0.93 mg/dL Final    Comment:    For patients >26 years of age, the reference limit for Creatinine is approximately 13% higher for people identified as African-American. .    Creatinine, Ser  Date Value Ref Range Status  03/23/2021 0.99 0.44 - 1.00 mg/dL Final          Passed - Ca in normal range and within 360 days    Calcium  Date Value Ref Range Status  03/23/2021 9.5 8.9 - 10.3 mg/dL Final   Calcium, Ion  Date Value Ref Range Status  01/05/2011 1.15 1.12 - 1.32 mmol/L Final          Passed - Last BP in normal range    BP Readings from Last 1 Encounters:  04/28/21 118/77          Passed - Valid encounter within last 6 months    Recent Outpatient Visits           1 month ago Colonic mass   [place], Coralie Keens, NP   2 months ago GAD (generalized anxiety disorder)   [facility] [place], Coralie Keens, NP   3 months ago Chronic venous insufficiency   [place], DO   3 months ago Chronic venous insufficiency   [place], Coralie Keens, NP   4 months ago Urinary frequency   [place], Devonne Doughty, DO       Future Appointments             In 2 weeks Debroah Loop, PA-C [place]   In 1 month Virgel Manifold, MD [place]

## 2021-05-22 ENCOUNTER — Ambulatory Visit: Payer: Medicare Other | Admitting: Gastroenterology

## 2021-05-27 ENCOUNTER — Encounter: Payer: Self-pay | Admitting: Internal Medicine

## 2021-05-27 ENCOUNTER — Ambulatory Visit (INDEPENDENT_AMBULATORY_CARE_PROVIDER_SITE_OTHER): Payer: Medicare Other | Admitting: Internal Medicine

## 2021-05-27 ENCOUNTER — Ambulatory Visit: Payer: Medicare Other | Admitting: Internal Medicine

## 2021-05-27 ENCOUNTER — Other Ambulatory Visit: Payer: Self-pay

## 2021-05-27 ENCOUNTER — Ambulatory Visit: Payer: Medicare Other | Admitting: Surgery

## 2021-05-27 DIAGNOSIS — F3341 Major depressive disorder, recurrent, in partial remission: Secondary | ICD-10-CM

## 2021-05-27 DIAGNOSIS — F411 Generalized anxiety disorder: Secondary | ICD-10-CM | POA: Diagnosis not present

## 2021-05-27 DIAGNOSIS — Z6837 Body mass index (BMI) 37.0-37.9, adult: Secondary | ICD-10-CM

## 2021-05-27 DIAGNOSIS — G959 Disease of spinal cord, unspecified: Secondary | ICD-10-CM | POA: Diagnosis not present

## 2021-05-27 MED ORDER — ALPRAZOLAM 0.5 MG PO TABS: 0.5000 mg | ORAL_TABLET | Freq: Two times a day (BID) | ORAL | 0 refills | Status: DC | PRN

## 2021-05-27 NOTE — Progress Notes (Signed)
Subjective:    Patient ID: Chelsea [name], female    DOB: [birth year], 75 y.o.   MRN: [id number]  HPI  Patient presents to clinic today for follow-up after cervical spine fusion on 7/5 by Dr. Cari Caraway.  She reports overall she is doing much better.  She has better range of motion of her neck and decrease pain.  She has a follow-up with Dr. Cari Caraway scheduled.  She has finally been released to move back home.  She has also been released to drive.  She is taking Tylenol and gabapentin as prescribed.  She also reports when she left rehab that her Xanax was not filled properly.  She will need a new prescription of this today.  Review of Systems     Past Medical History:  Diagnosis Date   Anxiety    Arthritis    Depression    Hyperlipidemia    Hypertension    Seizures ([place])    Spinal stenosis    Ulcer    Urinary incontinence     Current Outpatient Medications  Medication Sig Dispense Refill   acetaminophen (TYLENOL) 500 MG tablet Take 500 mg by mouth every 6 (six) hours as needed.     ALPRAZolam (XANAX) 0.5 MG tablet TAKE 1 TABLET BY MOUTH 2 TIMES DAILY AS NEEDED FOR ANXIETY. 60 tablet 0   aspirin 325 MG tablet Take 325 mg by mouth daily.     atorvastatin (LIPITOR) 40 MG tablet Take 1 tablet (40 mg total) by mouth daily. 90 tablet 3   busPIRone (BUSPAR) 5 MG tablet Take 1 tablet (5 mg total) by mouth 3 (three) times daily. 270 tablet 1   Cyanocobalamin (VITAMIN B 12 PO) Take 500 mcg by mouth daily.     diclofenac Sodium (VOLTAREN) 1 % GEL Apply 4 g topically 4 (four) times daily. 4 g 0   FLUoxetine (PROZAC) 20 MG capsule Take 3 capsules (60 mg total) by mouth every morning. (Patient taking differently: Take 20 mg by mouth as needed.) 180 capsule 0   gabapentin (NEURONTIN) 100 MG capsule Take 2 capsules (200 mg total) by mouth 2 (two) times daily. 360 capsule 1   lidocaine (LIDODERM) 5 % Place 1 patch onto the skin daily. Remove & Discard patch within 12 hours or as directed  by MD 1 patch 0   Na Sulfate-K Sulfate-Mg Sulf (SUPREP BOWEL PREP KIT) 17.5-3.13-1.6 GM/177ML SOLN Take 1 kit by mouth as directed. 354 mL 0   oxyCODONE-acetaminophen (PERCOCET) 5-325 MG tablet Take 1 tablet by mouth every 4 (four) hours as needed. 12 tablet 0   polyethylene glycol powder (GLYCOLAX/MIRALAX) 17 GM/SCOOP powder Take 17 g by mouth daily. 3350 g 1   triamterene-hydrochlorothiazide (MAXZIDE-25) 37.5-25 MG tablet TAKE 1 TABLET BY MOUTH EVERY DAY 90 tablet 1   No current facility-administered medications for this visit.    Allergies  Allergen Reactions   Penicillins     "Everything turned black"    Family History  Problem Relation Age of Onset   Arthritis Mother    Heart disease Mother    Stroke Mother    Hypertension Mother    COPD Mother    Sudden death Sister    Other Father        unknown medical history   Breast cancer Neg Hx     Social History   Socioeconomic History   Marital status: Divorced    Spouse name: Not on file   Number of children: Not on file  Years of education: Not on file   Highest education level: Not on file  Occupational History   Not on file  Tobacco Use   Smoking status: Never   Smokeless tobacco: Never  Vaping Use   Vaping Use: Never used  Substance and Sexual Activity   Alcohol use: Never   Drug use: No   Sexual activity: Not on file  Other Topics Concern   Not on file  Social History Narrative   Not on file   Social Determinants of Health   Financial Resource Strain: Not on file  Food Insecurity: Not on file  Transportation Needs: Not on file  Physical Activity: Not on file  Stress: Not on file  Social Connections: Not on file  Intimate Partner Violence: Not on file     Constitutional: Denies fever, malaise, fatigue, headache or abrupt weight changes.   Respiratory: Denies difficulty breathing, shortness of breath, cough or sputum production.   Cardiovascular: Denies chest pain, chest tightness, palpitations or  swelling in the hands  Musculoskeletal: Patient reports intermittent neck pain.  Denies decrease in range of motion, difficulty with gait, muscle pain or joint swelling.  Skin: Patient reports healed incision to posterior neck.  Denies redness, rashes, lesions or ulcercations.  Neurological: Denies dizziness, difficulty with memory, difficulty with speech or problems with balance and coordination.  Psych: Patient has a history of anxiety and depression.  Denies SI/HI.  No other specific complaints in a complete review of systems (except as listed in HPI above).  Objective:   Physical Exam   BP (!) 141/53 (BP Location: Right Arm, Patient Position: Sitting, Cuff Size: Normal)   Pulse 92   Temp 97.7 F (36.5 C) (Temporal)   Resp 17   Ht 4' 11"  (1.499 m)   Wt 178 lb 9.6 oz (81 kg)   SpO2 98%   BMI 36.07 kg/m   Wt Readings from Last 3 Encounters:  04/28/21 179 lb (81.2 kg)  04/04/21 183 lb (83 kg)  03/26/21 182 lb (82.6 kg)    General: Appears her stated age, obese, in NAD. Skin: Warm, dry and intact.  Incision to posterior neck slightly tender with palpation but intact. HEENT: Head: normal shape and size; Eyes: sclera white and EOMs intact;  Cardiovascular: Normal rate and rhythm. S1,S2 noted.  No murmur, rubs or gallops noted.  Pulmonary/Chest: Normal effort and positive vesicular breath sounds. No respiratory distress. No wheezes, rales or ronchi noted.  Musculoskeletal: Normal flexion, extension and rotation of the cervical spine.  Mild bony tenderness noted over the spine.  Shoulder shrug equal.  Gait slow and steady. Neurological: Alert and oriented.  Psychiatric: Mood and affect normal.  Mildly anxious appearing. Judgment and thought content normal.    BMET    Component Value Date/Time   NA 137 03/23/2021 1916   K 3.8 03/23/2021 1916   CL 101 03/23/2021 1916   CO2 28 03/23/2021 1916   GLUCOSE 103 (H) 03/23/2021 1916   BUN 19 03/23/2021 1916   CREATININE 0.99  03/23/2021 1916   CREATININE 0.87 08/13/2020 1129   CALCIUM 9.5 03/23/2021 1916   GFRNONAA 59 (L) 03/23/2021 1916   GFRNONAA 65 08/13/2020 1129   GFRAA 76 08/13/2020 1129    Lipid Panel     Component Value Date/Time   CHOL 238 (H) 02/26/2020 1130   TRIG 84 02/26/2020 1130   HDL 60 02/26/2020 1130   CHOLHDL 4.0 02/26/2020 1130   VLDL 18.4 02/08/2013 1638   LDLCALC 159 (H)  02/26/2020 1130    CBC    Component Value Date/Time   WBC 4.8 03/23/2021 1916   RBC 3.94 03/23/2021 1916   HGB 11.7 (L) 03/23/2021 1916   HCT 36.6 03/23/2021 1916   PLT 237 03/23/2021 1916   MCV 92.9 03/23/2021 1916   MCH 29.7 03/23/2021 1916   MCHC 32.0 03/23/2021 1916   RDW 13.8 03/23/2021 1916   LYMPHSABS 2.5 02/12/2021 0209   MONOABS 0.6 02/12/2021 0209   EOSABS 0.2 02/12/2021 0209   BASOSABS 0.0 02/12/2021 0209    Hgb A1C No results found for: HGBA1C         Assessment & Plan:   Webb Silversmith, NP This visit occurred during the SARS-CoV-2 public health emergency.  Safety protocols were in place, including screening questions prior to the visit, additional usage of staff PPE, and extensive cleaning of exam room while observing appropriate contact time as indicated for disinfecting solutions.

## 2021-05-28 DIAGNOSIS — R296 Repeated falls: Secondary | ICD-10-CM | POA: Diagnosis not present

## 2021-05-28 DIAGNOSIS — M4807 Spinal stenosis, lumbosacral region: Secondary | ICD-10-CM | POA: Diagnosis not present

## 2021-05-28 DIAGNOSIS — M5001 Cervical disc disorder with myelopathy,  high cervical region: Secondary | ICD-10-CM | POA: Diagnosis not present

## 2021-05-28 DIAGNOSIS — I10 Essential (primary) hypertension: Secondary | ICD-10-CM | POA: Diagnosis not present

## 2021-05-28 DIAGNOSIS — Z4789 Encounter for other orthopedic aftercare: Secondary | ICD-10-CM | POA: Diagnosis not present

## 2021-05-28 DIAGNOSIS — M19079 Primary osteoarthritis, unspecified ankle and foot: Secondary | ICD-10-CM | POA: Diagnosis not present

## 2021-05-29 ENCOUNTER — Ambulatory Visit: Payer: Medicare Other | Admitting: Surgery

## 2021-05-30 NOTE — Assessment & Plan Note (Signed)
Encourage diet and exercise for weight loss 

## 2021-05-30 NOTE — Assessment & Plan Note (Signed)
Continue fluoxetine and BuSpar Xanax refilled today Support offered

## 2021-05-30 NOTE — Assessment & Plan Note (Signed)
Improved status post cervical spinal fusion She will follow-up with Dr. Izora Ribas as scheduled

## 2021-06-03 ENCOUNTER — Telehealth: Payer: Self-pay | Admitting: Student

## 2021-06-03 ENCOUNTER — Ambulatory Visit: Payer: Self-pay | Admitting: Physician Assistant

## 2021-06-03 DIAGNOSIS — M19079 Primary osteoarthritis, unspecified ankle and foot: Secondary | ICD-10-CM | POA: Diagnosis not present

## 2021-06-03 DIAGNOSIS — Z4789 Encounter for other orthopedic aftercare: Secondary | ICD-10-CM | POA: Diagnosis not present

## 2021-06-03 DIAGNOSIS — M5001 Cervical disc disorder with myelopathy,  high cervical region: Secondary | ICD-10-CM | POA: Diagnosis not present

## 2021-06-03 DIAGNOSIS — I10 Essential (primary) hypertension: Secondary | ICD-10-CM | POA: Diagnosis not present

## 2021-06-03 DIAGNOSIS — M4807 Spinal stenosis, lumbosacral region: Secondary | ICD-10-CM | POA: Diagnosis not present

## 2021-06-03 DIAGNOSIS — R296 Repeated falls: Secondary | ICD-10-CM | POA: Diagnosis not present

## 2021-06-03 NOTE — Telephone Encounter (Signed)
Contacted patient to f/u with her to see if she wanted to schedule a Palliative f/u visit after her appointment with her surgeon and she was in agreement with this, this was scheduled for 06/12/21 @ 2 PM

## 2021-06-06 ENCOUNTER — Encounter: Payer: Self-pay | Admitting: Physician Assistant

## 2021-06-10 DIAGNOSIS — M5001 Cervical disc disorder with myelopathy,  high cervical region: Secondary | ICD-10-CM | POA: Diagnosis not present

## 2021-06-10 DIAGNOSIS — R296 Repeated falls: Secondary | ICD-10-CM | POA: Diagnosis not present

## 2021-06-10 DIAGNOSIS — M19079 Primary osteoarthritis, unspecified ankle and foot: Secondary | ICD-10-CM | POA: Diagnosis not present

## 2021-06-10 DIAGNOSIS — M4807 Spinal stenosis, lumbosacral region: Secondary | ICD-10-CM | POA: Diagnosis not present

## 2021-06-10 DIAGNOSIS — I10 Essential (primary) hypertension: Secondary | ICD-10-CM | POA: Diagnosis not present

## 2021-06-10 DIAGNOSIS — Z4789 Encounter for other orthopedic aftercare: Secondary | ICD-10-CM | POA: Diagnosis not present

## 2021-06-12 ENCOUNTER — Other Ambulatory Visit: Payer: Self-pay

## 2021-06-12 ENCOUNTER — Other Ambulatory Visit: Payer: Medicare Other | Admitting: Student

## 2021-06-12 DIAGNOSIS — F339 Major depressive disorder, recurrent, unspecified: Secondary | ICD-10-CM

## 2021-06-12 DIAGNOSIS — Z515 Encounter for palliative care: Secondary | ICD-10-CM | POA: Diagnosis not present

## 2021-06-12 DIAGNOSIS — M19079 Primary osteoarthritis, unspecified ankle and foot: Secondary | ICD-10-CM | POA: Diagnosis not present

## 2021-06-12 DIAGNOSIS — F411 Generalized anxiety disorder: Secondary | ICD-10-CM

## 2021-06-12 DIAGNOSIS — K5901 Slow transit constipation: Secondary | ICD-10-CM | POA: Diagnosis not present

## 2021-06-12 DIAGNOSIS — R52 Pain, unspecified: Secondary | ICD-10-CM

## 2021-06-12 DIAGNOSIS — R296 Repeated falls: Secondary | ICD-10-CM | POA: Diagnosis not present

## 2021-06-12 DIAGNOSIS — I10 Essential (primary) hypertension: Secondary | ICD-10-CM | POA: Diagnosis not present

## 2021-06-12 DIAGNOSIS — Z4789 Encounter for other orthopedic aftercare: Secondary | ICD-10-CM | POA: Diagnosis not present

## 2021-06-12 DIAGNOSIS — M4807 Spinal stenosis, lumbosacral region: Secondary | ICD-10-CM | POA: Diagnosis not present

## 2021-06-12 DIAGNOSIS — M5001 Cervical disc disorder with myelopathy,  high cervical region: Secondary | ICD-10-CM | POA: Diagnosis not present

## 2021-06-12 DIAGNOSIS — R42 Dizziness and giddiness: Secondary | ICD-10-CM | POA: Diagnosis not present

## 2021-06-12 NOTE — Progress Notes (Signed)
[facility] Consult Note Telephone: [phone]  Fax: [phone]    Date of encounter: 06/12/21 2:16 PM PATIENT NAME: Chelsea [name] [address] [place] [address]   [phone] (home)  DOB: [birth year] MRN: [id number] PRIMARY CARE PROVIDER:    Jearld Fenton, NP,  [place] [address] [phone]  REFERRING PROVIDER:   Jearld Fenton, NP [address] [place],  [place] [address] [phone]  RESPONSIBLE PARTY:    Contact Information     Name Relation Home Work Mobile   Chelsea [name],Chelsea [name] [phone]  [phone]   Chelsea [name] Relative   [phone]        I met face to face with patient and family in the home. Palliative Care was asked to follow this patient by consultation request of  Chelsea Fenton, NP to address advance care planning and complex medical decision making. This is a follow up visit.                                   ASSESSMENT AND PLAN / RECOMMENDATIONS:   Advance Care Planning/Goals of Care: Goals include to maximize quality of life and symptom management. CODE STATUS:  Symptom Management/Plan:  Pain-pain improved since spinal fusion. Continue taking acetaminophen PRN, gabapentin routinely and robaxin PRN. Continue PT as directed.   Constipation-using dulcolax soft chews PRN, miralax PRN. Encourage well balanced diet with fiber; ambulation encouraged.   Anxiety/depression-continue alprazolam, Buspar, Prozac as directed.  Follow up Palliative Care Visit: Palliative care will continue to follow for complex medical decision making, advance care planning, and clarification of goals. Return in 8 weeks or prn.  I spent 40 minutes providing this consultation. More than 50% of the time in this consultation was spent in counseling and care coordination.   PPS: 60%  HOSPICE ELIGIBILITY/DIAGNOSIS: TBD  Chief Complaint: Palliative Medicine follow up visit.   HISTORY  OF PRESENT ILLNESS:  Chelsea [name] is a 75 y.o. year old female  with OA, anxiety, depression, seizures, spinal stenosis, cervical myelopathy s/p cervical spinal fusion, urinary incontinence.  Patient resides at [facility]. She states she has been doing better since her surgery. She recently moved back to [facility] after going to skilled rehab. She is currently receiving PT with [facility]. She uses a walker for ambulation; no falls reported. Her pain has been managed; she is taking acetaminophen as needed. Denies shortness of breath. She does take laxatives for constipation. She has been voiding without difficulty. Appetite is good. Sleeping well. She states her depression and anxiety are well managed. Patient still drives. A 10-point review of systems is negative, except for the pertinent positives and negatives detailed in the HPI.    History obtained from review of EMR, discussion with primary team, and interview with family, facility staff/caregiver and/or Chelsea [name].  I reviewed available labs, medications, imaging, studies and related documents from the EMR.  Records reviewed and summarized above.    Physical Exam: Pulse 76, resp 18, b/p 120/70, sats 96% on room air Constitutional: NAD General: frail appearing EYES: anicteric sclera, lids intact, no discharge  ENMT: intact hearing, oral mucous membranes moist, dentition intact CV: S1S2, RRR, no LE edema Pulmonary: LCTA, no increased work of breathing, no cough Abdomen: normo-active BS + 4 quadrants, soft and non tender GU: deferred MSK: no sarcopenia, moves all extremities,  ambulatory Skin: warm and dry, no rashes or wounds on visible skin Neuro: generalized weakness,  A and O x 3 Psych: non-anxious affect, pleasant Hem/lymph/immuno: no widespread bruising   Thank you for the opportunity to participate in the care of Chelsea [name].  The palliative care team will continue to follow. Please call our office at  [phone] if we can be of additional assistance.   Chelsea Slocumb, NP   COVID-19 PATIENT SCREENING TOOL Asked and negative response unless otherwise noted:   Have you had symptoms of covid, tested positive or been in contact with someone with symptoms/positive test in the past 5-10 days? No

## 2021-06-16 ENCOUNTER — Other Ambulatory Visit: Payer: Self-pay

## 2021-06-16 ENCOUNTER — Encounter: Payer: Self-pay | Admitting: Urology

## 2021-06-16 ENCOUNTER — Ambulatory Visit (INDEPENDENT_AMBULATORY_CARE_PROVIDER_SITE_OTHER): Payer: Medicare Other | Admitting: Urology

## 2021-06-16 VITALS — BP 109/89 | HR 68 | Ht 59.0 in | Wt 178.0 lb

## 2021-06-16 DIAGNOSIS — R339 Retention of urine, unspecified: Secondary | ICD-10-CM

## 2021-06-16 DIAGNOSIS — N3946 Mixed incontinence: Secondary | ICD-10-CM

## 2021-06-16 LAB — BLADDER SCAN AMB NON-IMAGING: Scan Result: 120

## 2021-06-16 NOTE — Progress Notes (Signed)
06/16/2021 2:47 PM   Chelsea [name] April 25, 1946 [id number]  Referring provider: Jearld Fenton, NP [place],  [place] [address]  Chief Complaint  Patient presents with   Follow-up    HPI: Patient was here for a trial of voiding.  She had surgery July 5.  She uses a walker.  She had neck surgery.  She normally voids on her own and has urge and bladder but does not wear pads.  No history of bladder surgery or bladder infections   She saw my partner in May was given Myrbetriq 25 mg for overactive bladder.  She was scanned at the time for 300 mL but she did not void for many hours   Urine culture June 2022 negative and a stop the Myrbetriq  Today Frequency stable Patient is having leakage after she wakes up.  She gets little bit of leg numbness and cannot get out of bed quickly.  She says she is voiding better and her postvoid residual today was 120 mL.  I will check this again next time.  Clinically not infected     PMH: Past Medical History:  Diagnosis Date   Anxiety    Arthritis    Depression    Hyperlipidemia    Hypertension    Seizures ([place])    Spinal stenosis    Ulcer    Urinary incontinence     Surgical History: Past Surgical History:  Procedure Laterality Date   ABDOMINAL HYSTERECTOMY     BACK SURGERY     due to polio   BREAST BIOPSY Bilateral    neg   BREAST BIOPSY Right 2011   neg/stereo   CARPAL TUNNEL RELEASE     CATARACT EXTRACTION Right 2020   COLONOSCOPY WITH PROPOFOL N/A 04/04/2021   Procedure: COLONOSCOPY WITH PROPOFOL;  Surgeon: Virgel Manifold, MD;  Location: [facility] ENDOSCOPY;  Service: Endoscopy;  Laterality: N/A;   EYE SURGERY      Home Medications:  Allergies as of 06/16/2021       Reactions   Penicillins    "Everything turned black"        Medication List        Accurate as of June 16, 2021  2:47 PM. If you have any questions, ask your nurse or doctor.          acetaminophen 500 MG tablet Commonly  known as: TYLENOL Take 500 mg by mouth every 6 (six) hours as needed.   ALPRAZolam 0.5 MG tablet Commonly known as: XANAX Take 1 tablet (0.5 mg total) by mouth 2 (two) times daily as needed for anxiety.   aspirin 325 MG tablet Take 325 mg by mouth daily.   atorvastatin 40 MG tablet Commonly known as: LIPITOR Take 1 tablet (40 mg total) by mouth daily.   busPIRone 5 MG tablet Commonly known as: BUSPAR Take 1 tablet (5 mg total) by mouth 3 (three) times daily.   diclofenac Sodium 1 % Gel Commonly known as: VOLTAREN Apply 4 g topically 4 (four) times daily.   FLUoxetine 20 MG capsule Commonly known as: PROZAC Take 3 capsules (60 mg total) by mouth every morning. What changed:  how much to take when to take this reasons to take this   gabapentin 100 MG capsule Commonly known as: NEURONTIN Take 2 capsules (200 mg total) by mouth 2 (two) times daily.   lidocaine 5 % Commonly known as: LIDODERM Place 1 patch onto the skin daily. Remove & Discard patch  within 12 hours or as directed by MD   methocarbamol 500 MG tablet Commonly known as: ROBAXIN Take 500 mg by mouth every 6 (six) hours as needed for muscle spasms. 1-2 tabs every 6 hours PRN.   polyethylene glycol powder 17 GM/SCOOP powder Commonly known as: GLYCOLAX/MIRALAX Take 17 g by mouth daily.   triamterene-hydrochlorothiazide 37.5-25 MG tablet Commonly known as: MAXZIDE-25 TAKE 1 TABLET BY MOUTH EVERY DAY   VITAMIN B 12 PO Take 500 mcg by mouth daily.   vitamin E 180 MG (400 UNITS) capsule Take 400 Units by mouth daily.        Allergies:  Allergies  Allergen Reactions   Penicillins     "Everything turned black"    Family History: Family History  Problem Relation Age of Onset   Arthritis Mother    Heart disease Mother    Stroke Mother    Hypertension Mother    COPD Mother    Sudden death Sister    Other Father        unknown medical history   Breast cancer Neg Hx     Social History:   reports that she has never smoked. She has never used smokeless tobacco. She reports that she does not drink alcohol and does not use drugs.  ROS:                                        Physical Exam: BP 109/89   Pulse 68   Ht '4\' 11"'$  (1.499 m)   Wt 80.7 kg   BMI 35.95 kg/m   Constitutional:  Alert and oriented, No acute distress. HEENT: [place] AT, moist mucus membranes.  Trachea midline, no masses.  Laboratory Data: Lab Results  Component Value Date   WBC 4.8 03/23/2021   HGB 11.7 (L) 03/23/2021   HCT 36.6 03/23/2021   MCV 92.9 03/23/2021   PLT 237 03/23/2021    Lab Results  Component Value Date   CREATININE 0.99 03/23/2021    No results found for: PSA  No results found for: TESTOSTERONE  No results found for: HGBA1C  Urinalysis    Component Value Date/Time   COLORURINE YELLOW (A) 03/24/2021 0009   APPEARANCEUR TURBID (A) 03/24/2021 0009   LABSPEC 1.015 03/24/2021 0009   PHURINE 6.0 03/24/2021 0009   GLUCOSEU NEGATIVE 03/24/2021 0009   HGBUR SMALL (A) 03/24/2021 0009   BILIRUBINUR NEGATIVE 03/24/2021 0009   BILIRUBINUR Negative 01/14/2021 1354   KETONESUR NEGATIVE 03/24/2021 0009   PROTEINUR 100 (A) 03/24/2021 0009   UROBILINOGEN 0.2 01/14/2021 1354   NITRITE NEGATIVE 03/24/2021 0009   LEUKOCYTESUR LARGE (A) 03/24/2021 0009    Pertinent Imaging:   Assessment & Plan: I am not convinced that her bladder is changed but I will dive into her history more detail in 3 months.  I will assess before and after surgery bladder function and proceed accordingly  1. Incomplete bladder emptying  - Bladder Scan (Post Void Residual) in office   No follow-ups on file.  Reece Packer, MD  [place] [address] [place], [place] [address] [phone]

## 2021-06-17 ENCOUNTER — Emergency Department: Payer: Medicare Other

## 2021-06-17 ENCOUNTER — Emergency Department
Admission: EM | Admit: 2021-06-17 | Discharge: 2021-06-17 | Disposition: A | Discharge status: Home / Self Care | Payer: Medicare Other | Attending: Emergency Medicine | Admitting: Emergency Medicine

## 2021-06-17 ENCOUNTER — Other Ambulatory Visit: Payer: Self-pay

## 2021-06-17 DIAGNOSIS — Z79899 Other long term (current) drug therapy: Secondary | ICD-10-CM | POA: Insufficient documentation

## 2021-06-17 DIAGNOSIS — R42 Dizziness and giddiness: Secondary | ICD-10-CM | POA: Insufficient documentation

## 2021-06-17 DIAGNOSIS — I129 Hypertensive chronic kidney disease with stage 1 through stage 4 chronic kidney disease, or unspecified chronic kidney disease: Secondary | ICD-10-CM | POA: Diagnosis not present

## 2021-06-17 DIAGNOSIS — Z7982 Long term (current) use of aspirin: Secondary | ICD-10-CM | POA: Diagnosis not present

## 2021-06-17 DIAGNOSIS — I10 Essential (primary) hypertension: Secondary | ICD-10-CM | POA: Diagnosis not present

## 2021-06-17 DIAGNOSIS — N1831 Chronic kidney disease, stage 3a: Secondary | ICD-10-CM | POA: Diagnosis not present

## 2021-06-17 DIAGNOSIS — R5381 Other malaise: Secondary | ICD-10-CM | POA: Diagnosis not present

## 2021-06-17 DIAGNOSIS — M542 Cervicalgia: Secondary | ICD-10-CM | POA: Insufficient documentation

## 2021-06-17 DIAGNOSIS — I517 Cardiomegaly: Secondary | ICD-10-CM | POA: Diagnosis not present

## 2021-06-17 DIAGNOSIS — R0602 Shortness of breath: Secondary | ICD-10-CM | POA: Diagnosis not present

## 2021-06-17 DIAGNOSIS — N3090 Cystitis, unspecified without hematuria: Secondary | ICD-10-CM | POA: Diagnosis not present

## 2021-06-17 DIAGNOSIS — I959 Hypotension, unspecified: Secondary | ICD-10-CM | POA: Diagnosis not present

## 2021-06-17 DIAGNOSIS — G319 Degenerative disease of nervous system, unspecified: Secondary | ICD-10-CM | POA: Diagnosis not present

## 2021-06-17 LAB — URINALYSIS, COMPLETE (UACMP) WITH MICROSCOPIC
Bilirubin Urine: NEGATIVE
Glucose, UA: NEGATIVE mg/dL
Hgb urine dipstick: NEGATIVE
Ketones, ur: NEGATIVE mg/dL
Nitrite: POSITIVE — AB
Protein, ur: NEGATIVE mg/dL
Specific Gravity, Urine: 1.005 (ref 1.005–1.030)
pH: 7 (ref 5.0–8.0)

## 2021-06-17 LAB — CBC
HCT: 34.3 % — ABNORMAL LOW (ref 36.0–46.0)
Hemoglobin: 11.1 g/dL — ABNORMAL LOW (ref 12.0–15.0)
MCH: 30.1 pg (ref 26.0–34.0)
MCHC: 32.4 g/dL (ref 30.0–36.0)
MCV: 93 fL (ref 80.0–100.0)
Platelets: 212 10*3/uL (ref 150–400)
RBC: 3.69 MIL/uL — ABNORMAL LOW (ref 3.87–5.11)
RDW: 13.7 % (ref 11.5–15.5)
WBC: 4.1 10*3/uL (ref 4.0–10.5)
nRBC: 0 % (ref 0.0–0.2)

## 2021-06-17 LAB — BASIC METABOLIC PANEL
Anion gap: 10 (ref 5–15)
BUN: 14 mg/dL (ref 8–23)
CO2: 27 mmol/L (ref 22–32)
Calcium: 9.1 mg/dL (ref 8.9–10.3)
Chloride: 101 mmol/L (ref 98–111)
Creatinine, Ser: 0.77 mg/dL (ref 0.44–1.00)
GFR, Estimated: >60 mL/min (ref >60)
Glucose, Bld: 97 mg/dL (ref 70–99)
Potassium: 3.3 mmol/L — ABNORMAL LOW (ref 3.5–5.1)
Sodium: 138 mmol/L (ref 135–145)

## 2021-06-17 LAB — TROPONIN I (HIGH SENSITIVITY): Troponin I (High Sensitivity): 7 ng/L (ref <18)

## 2021-06-17 IMAGING — MR MR HEAD W/O CM
13 series · 48 of 48 positions shown · non-contrast
Comparison: CT head [DATE].  MRI head [DATE].

CLINICAL DATA: Dizziness, non-specific

EXAM:
MRI HEAD WITHOUT CONTRAST
TECHNIQUE: Multiplanar, multiecho pulse sequences of the brain and surrounding
structures were obtained without intravenous contrast.

[Series 5: ax dwi_tracew · axial · 3.0mm · 0.65mm/px · z∈[-152,-0]mm · 2 of 48 slices shown]
[im 1/48]
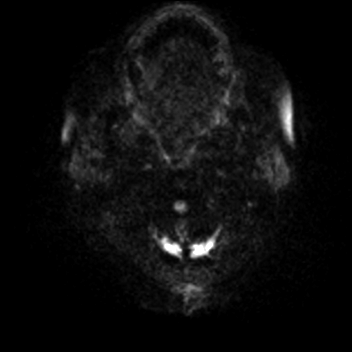
[im 48/48]
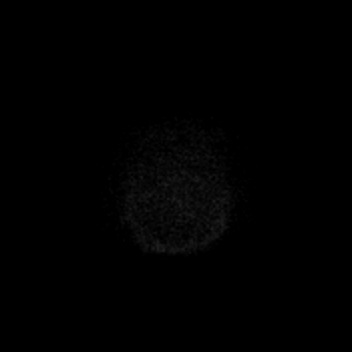

[Series 6: ax dwi_adc · axial · 3.0mm · 0.65mm/px · z∈[-152,-10]mm · 3 of 43 slices shown]
[im 1/43]
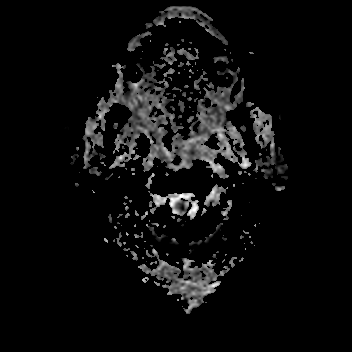
[im 22/43]
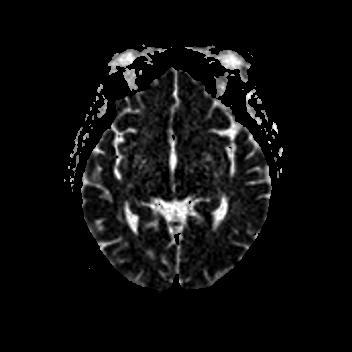
[im 43/43]
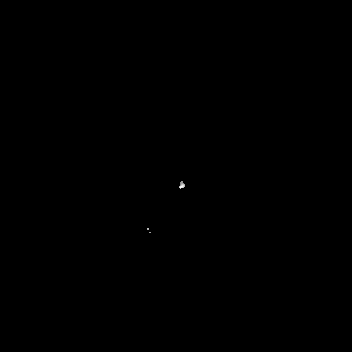

[Series 7: cor dwi_tracew · coronal · 5.0mm · 0.65mm/px · 3 of 40 slices shown]
[im 1/40]
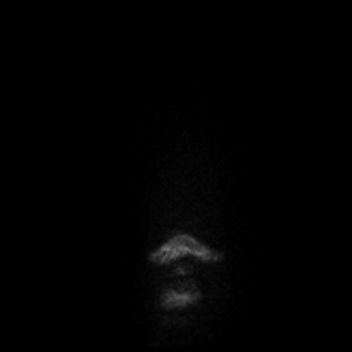
[im 20/40]
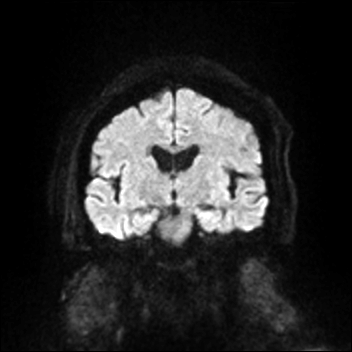
[im 40/40]
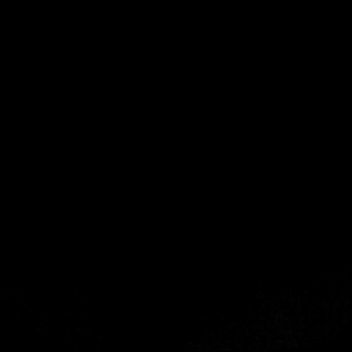

[Series 8: cor dwi_adc · coronal · 5.0mm · 0.65mm/px · 2 of 34 slices shown]
[im 1/34]
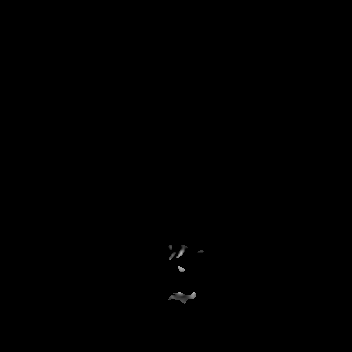
[im 34/34]
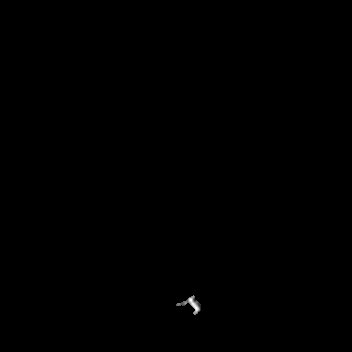

[Series 9: T1 · sagittal · 5.0mm · 0.62mm/px · 2 of 25 slices shown (1 of 2)]
[im 1/25]
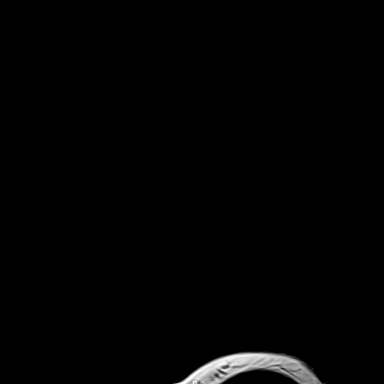
[im 25/25]
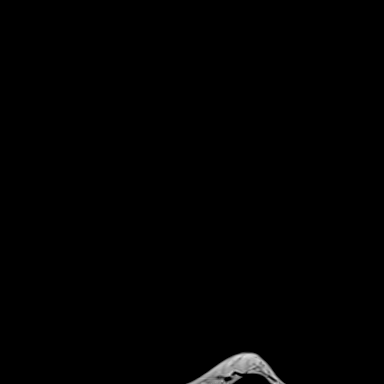

[Series 10: T2 · axial · 5.0mm · 0.53mm/px · z∈[-146,-5]mm · 2 of 25 slices shown (1 of 2)]
[im 1/25]
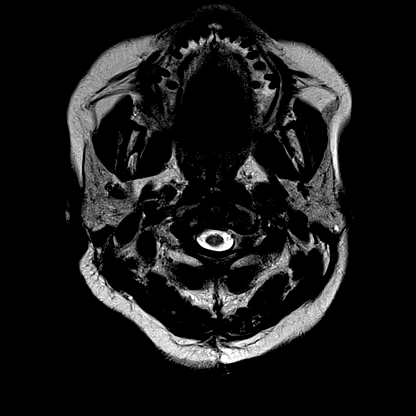
[im 25/25]
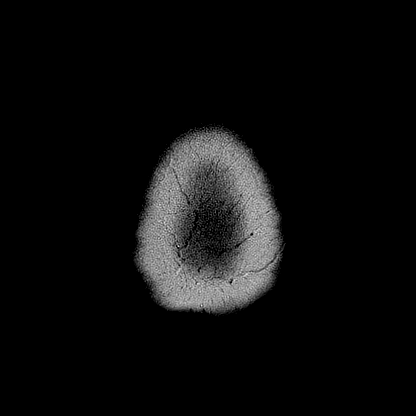

[Series 11: mag_images · axial · 3.0mm · 0.90mm/px · z∈[-163,+10]mm · 4 of 60 slices shown]
[im 1/60]
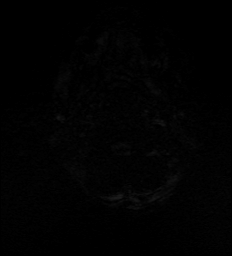
[im 20/60]
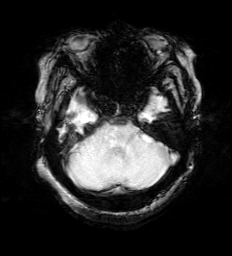
[im 40/60]
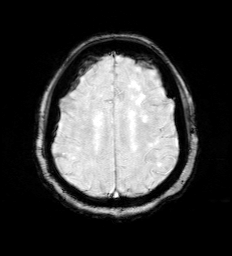
[im 60/60]
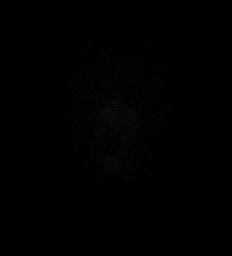

[Series 12: pha_images · axial · 3.0mm · 0.90mm/px · z∈[-163,+10]mm · 4 of 60 slices shown]
[im 1/60]
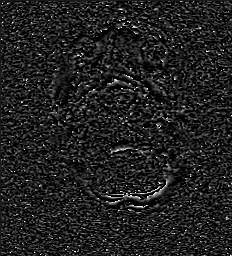
[im 20/60]
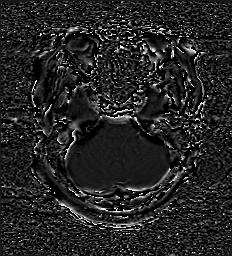
[im 40/60]
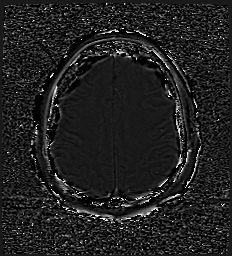
[im 60/60]
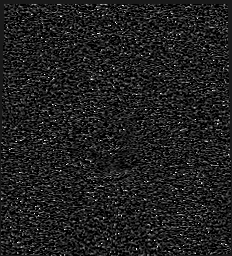

[Series 13: swi_images · axial · 3.0mm · 0.90mm/px · z∈[-163,+10]mm · 4 of 60 slices shown]
[im 1/60]
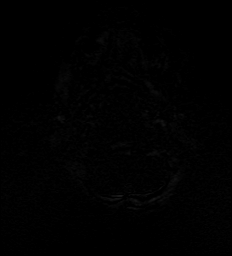
[im 20/60]
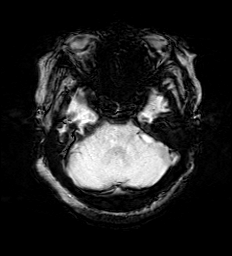
[im 40/60]
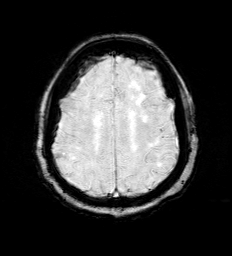
[im 60/60]
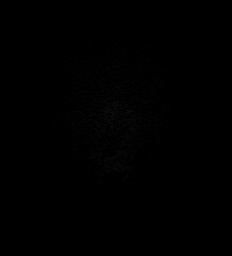

[Series 14: mip_images(sw) · axial · 24.0mm · 0.90mm/px · z∈[-153,-0]mm · 4 of 53 slices shown]
[im 1/53]
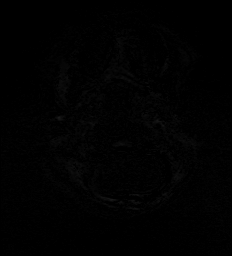
[im 18/53]
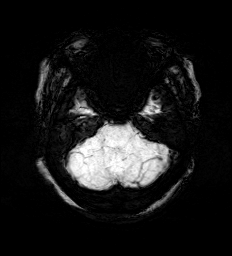
[im 35/53]
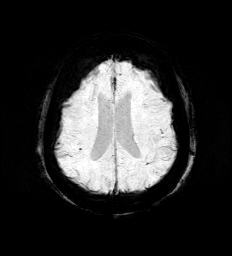
[im 53/53]
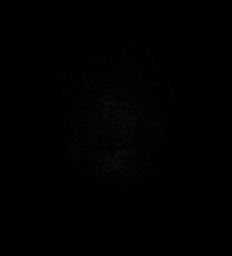

[Series 15: FLAIR · axial · 3.0mm · 0.53mm/px · z∈[-154,+4]mm · 4 of 55 slices shown]
[im 1/55]
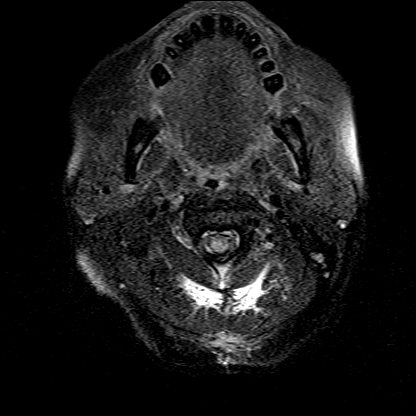
[im 19/55]
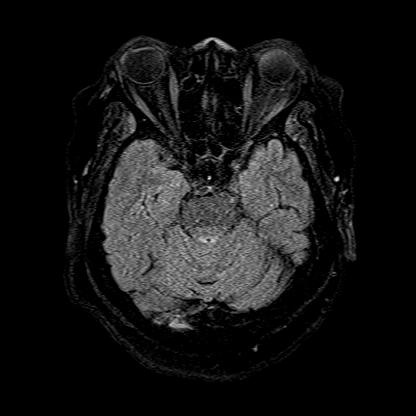
[im 37/55]
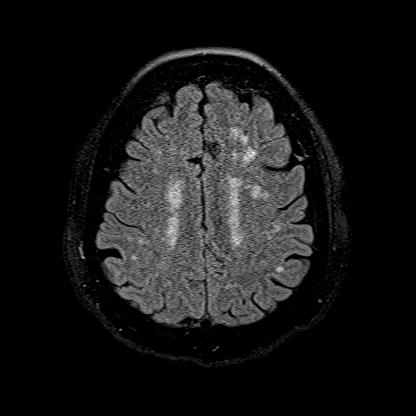
[im 55/55]
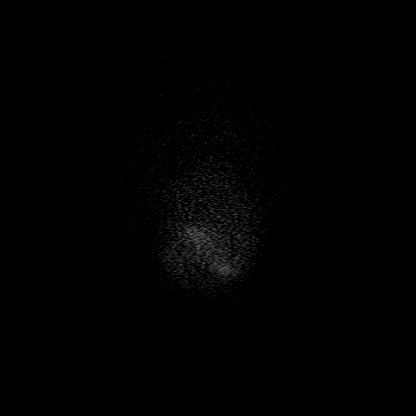

[Series 16: T1 · axial · 1.0mm · 0.98mm/px · z∈[-164,+7]mm · 12 of 176 slices shown (2 of 2)]
[im 1/176]
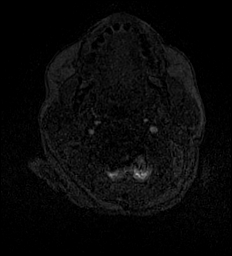
[im 16/176]
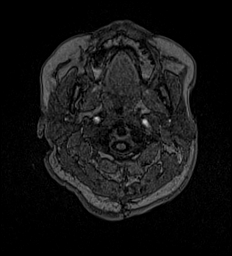
[im 32/176]
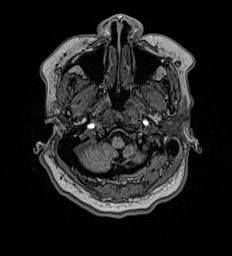
[im 48/176]
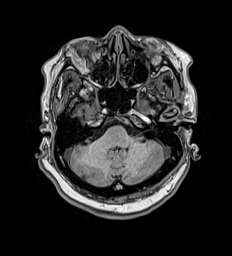
[im 64/176]
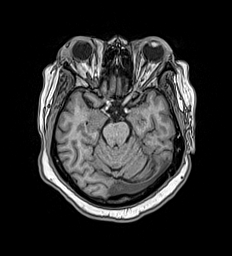
[im 80/176]
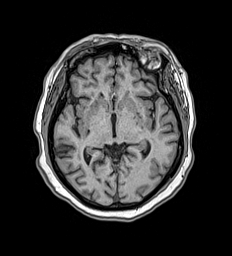
[im 96/176]
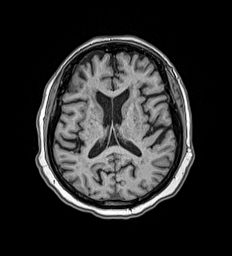
[im 112/176]
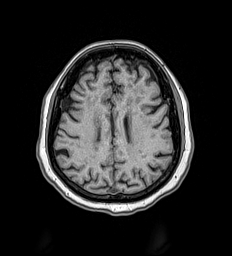
[im 128/176]
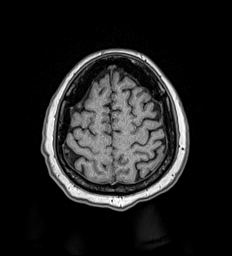
[im 144/176]
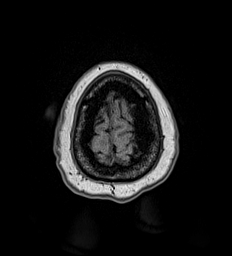
[im 160/176]
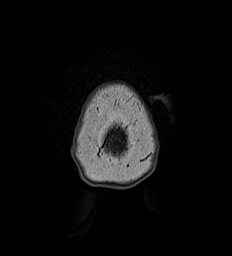
[im 176/176]
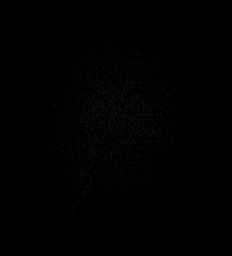

[Series 17: T2 · coronal · 5.0mm · 0.57mm/px · 2 of 29 slices shown (2 of 2)]
[im 1/29]
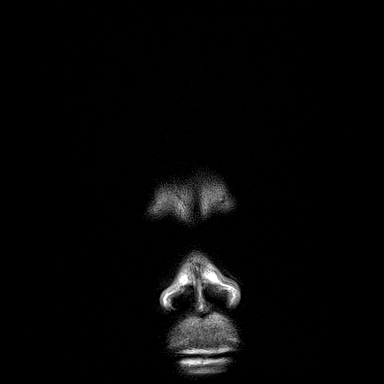
[im 29/29]
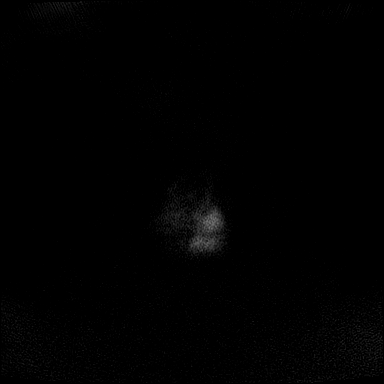

[48 of 48 positions shown; findings below may reference images not displayed]

FINDINGS: Brain: No convincing true restricted diffusion to suggest acute
infarct. Mild DWI hyperintensity in the high left parietal lobe has
correlate ADC hyperintensity and T2 hyperintensity, therefore
favored to represent T2 shine through. No acute hemorrhage,
hydrocephalus, mass lesion, midline shift, or extra-axial fluid
collection. Few scattered small foci of susceptibility artifact in
the left basal ganglia and bilateral cerebral, likely the sequela of
prior microhemorrhages. Moderate T2 hyperintensities within the
white matter, nonspecific but compatible with chronic microvascular
ischemic disease. Small remote lacunar infarct in the pons, right
basal ganglia, and right corona radiata. Dilated perivascular spaces
inferior basal ganglia bilaterally. Mild for age atrophy.

Vascular: Major arterial flow voids are maintained at the skull
base.

Skull and upper cervical spine: No focal marrow replacing process.
Partially imaged cervical fusion hardware. Probable at least
moderate canal stenosis at C3-C4, incompletely evaluated.

Sinuses/Orbits: Clear sinuses.  No acute orbital findings.

Other: No sizable mastoid effusions.
IMPRESSION: 1. No evidence of acute intracranial abnormality.
2. Moderate chronic microvascular ischemic disease with small remote
lacunar infarcts, as detailed above.
3. A few scattered prior microhemorrhages.
4. Probable at least moderate canal stenosis at C3-C4, incompletely
evaluated. An MRI of the cervical spine could further characterize
if clinically indicated.

## 2021-06-17 MED ORDER — MECLIZINE HCL 25 MG PO TABS
25.0000 mg | ORAL_TABLET | Freq: Once | ORAL | Status: AC
  Administered 2021-06-17: 25 mg via ORAL
  Filled 2021-06-17: qty 1

## 2021-06-17 MED ORDER — ONDANSETRON 4 MG PO TBDP
4.0000 mg | ORAL_TABLET | Freq: Once | ORAL | Status: AC
  Administered 2021-06-17: 4 mg via ORAL
  Filled 2021-06-17: qty 1

## 2021-06-17 MED ORDER — CEPHALEXIN 500 MG PO CAPS: 500.0000 mg | ORAL_CAPSULE | Freq: Two times a day (BID) | ORAL | 0 refills | Status: AC

## 2021-06-17 MED ORDER — MECLIZINE HCL 12.5 MG PO TABS: 12.5000 mg | ORAL_TABLET | Freq: Three times a day (TID) | ORAL | 0 refills | Status: DC | PRN

## 2021-06-17 MED ORDER — ACETAMINOPHEN 500 MG PO TABS
1000.0000 mg | ORAL_TABLET | Freq: Once | ORAL | Status: AC
  Administered 2021-06-17: 1000 mg via ORAL
  Filled 2021-06-17: qty 2

## 2021-06-17 MED ORDER — POTASSIUM CHLORIDE CRYS ER 20 MEQ PO TBCR
40.0000 meq | EXTENDED_RELEASE_TABLET | Freq: Once | ORAL | Status: AC
  Administered 2021-06-17: 40 meq via ORAL
  Filled 2021-06-17: qty 2

## 2021-06-17 MED ORDER — ONDANSETRON HCL 4 MG/2ML IJ SOLN
4.0000 mg | Freq: Once | INTRAMUSCULAR | Status: DC
  Filled 2021-06-17: qty 2

## 2021-06-17 NOTE — ED Triage Notes (Signed)
First nurse note: pt ems from [facility] for dizziness and nausea. Cbg 91

## 2021-06-17 NOTE — ED Notes (Signed)
Pt refusing IV 

## 2021-06-17 NOTE — ED Notes (Signed)
Pt called out requesting discharge paperwork. RN helped pt use phone to call [facility] independent living to bring clothes and for a ride. RN asked pt if she would like to wait on clothes. Pt stated that she would wait outside for clothes. When RN went in room with discharge paperwork and wheelchair, pt verbally aggressive and calling RN names. Agricultural consultant and security came to bedside. Pt placed in paper scrubs and wheeled to waiting room with security by side.

## 2021-06-17 NOTE — ED Notes (Signed)
Pt refusing covid test at this time, states she has to pay this bill and has a test weekly

## 2021-06-17 NOTE — ED Notes (Signed)
Patient transported to MRI 

## 2021-06-17 NOTE — ED Notes (Signed)
Pt back from MRI 

## 2021-06-17 NOTE — ED Notes (Addendum)
Patient wet brief, bed liens and personal gown. Patient cleaned, placed in hospital gown and given new brief/ bed liens. Patient's red gown from home placed in patient belongings bag at bedside. Warm blankets provided at this time.

## 2021-06-17 NOTE — ED Provider Notes (Signed)
[facility] Emergency Department Provider Note  ____________________________________________   Event Date/Time   First MD Initiated Contact with Patient 06/17/21 1011     (approximate)  I have reviewed the triage vital signs and the nursing notes.   HISTORY  Chief Complaint Dizziness and Nausea    HPI Chelsea [name] is a 75 y.o. female with history of hypertension, seizures who comes in from [facility] for dizziness and nausea.  Patient did that she woke up this morning and felt fine.  She stated that she was on the phone with her family member and they were undergoing a lot of stress and that she hung up and that when she went to go stand up she had some dizziness and feeling like the room was spinning and felt like she was going to vomit.  This was constant, nothing made it better or worse.  She states that she felt like she was dying and she felt like she was going to fall but did not fall, did not hit her head.  She reports some chronic neck pain after recently having a surgery back in July for spinal stenosis but denies anything that is new or different.  Denies any other recent neck injuries.  She states that she has never had this previously.  She states that her symptoms are better now but not going away and even when she is laying completely still she still feels some sweatiness and nausea.      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 04/28/2021   Stage 3a chronic kidney disease ([place]) 03/25/2021   Aortic atherosclerosis ([place]) 03/25/2021   Urinary incontinence, mixed 03/25/2021   Class 2 severe obesity due to excess calories with serious comorbidity and body mass index (BMI) of 37.0 to 37.9  in adult [facility]) 03/16/2021   Cervical myelopathy ([place]) 01/28/2021   Mixed incontinence 01/28/2021   Chronic anemia 09/26/2018   Depression, major, recurrent, 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/11/2018   Pure hypercholesterolemia 01/11/2018   GAD (generalized anxiety disorder) 05/14/2011   Essential hypertension 02/06/2011   Spinal stenosis of lumbosacral region 02/06/2011    Past Surgical History:  Procedure Laterality Date   ABDOMINAL HYSTERECTOMY     BACK SURGERY     due to polio   BREAST BIOPSY Bilateral    neg   BREAST BIOPSY Right 2011   neg/stereo   CARPAL TUNNEL RELEASE     CATARACT EXTRACTION Right 2020   COLONOSCOPY WITH PROPOFOL N/A 04/04/2021   Procedure: COLONOSCOPY WITH PROPOFOL;  Surgeon: Virgel Manifold, MD;  Location: [facility] ENDOSCOPY;  Service: Endoscopy;  Laterality: N/A;   EYE SURGERY      Prior to Admission medications   Medication Sig Start Date End Date Taking? Authorizing Provider  acetaminophen (TYLENOL) 500 MG tablet Take 500 mg by mouth every 6 (six) hours as needed.    [provider]  ALPRAZolam Duanne Moron) 0.5 MG tablet Take 1 tablet (0.5 mg total) by mouth 2 (two) times daily as needed for anxiety. 05/27/21   Jearld Fenton, NP  aspirin 325 MG tablet Take 325 mg by mouth daily.  [provider]  atorvastatin (LIPITOR) 40 MG tablet Take 1 tablet (40 mg total) by mouth daily. 02/26/21   Jearld Fenton, NP  busPIRone (BUSPAR) 5 MG tablet Take 1 tablet (5 mg total) by mouth 3 (three) times daily. 04/11/21   Jearld Fenton, NP  Cyanocobalamin (VITAMIN B 12 PO) Take 500 mcg by mouth daily.    [provider]  diclofenac Sodium (VOLTAREN) 1 % GEL Apply 4 g topically 4 (four) times daily. 12/17/20   Kathrine Haddock, NP  FLUoxetine (PROZAC) 20 MG capsule Take 3 capsules (60 mg total) by mouth every morning. Patient taking differently: Take 20 mg by mouth as needed. 02/27/21   Jearld Fenton, NP  gabapentin (NEURONTIN) 100 MG capsule Take 2  capsules (200 mg total) by mouth 2 (two) times daily. 01/03/21   Karamalegos, Devonne Doughty, DO  lidocaine (LIDODERM) 5 % Place 1 patch onto the skin daily. Remove & Discard patch within 12 hours or as directed by MD 10/24/20   Max Sane, MD  methocarbamol (ROBAXIN) 500 MG tablet Take 500 mg by mouth every 6 (six) hours as needed for muscle spasms. 1-2 tabs every 6 hours PRN.    [provider]  polyethylene glycol powder (GLYCOLAX/MIRALAX) 17 GM/SCOOP powder Take 17 g by mouth daily. 01/28/21   Jearld Fenton, NP  triamterene-hydrochlorothiazide (MAXZIDE-25) 37.5-25 MG tablet TAKE 1 TABLET BY MOUTH EVERY DAY 05/20/21   Jearld Fenton, NP  vitamin E 180 MG (400 UNITS) capsule Take 400 Units by mouth daily.    [provider]    Allergies Penicillins  Family History  Problem Relation Age of Onset   Arthritis Mother    Heart disease Mother    Stroke Mother    Hypertension Mother    COPD Mother    Sudden death Sister    Other Father        unknown medical history   Breast cancer Neg Hx     Social History Social History   Tobacco Use   Smoking status: Never   Smokeless tobacco: Never  Vaping Use   Vaping Use: Never used  Substance Use Topics   Alcohol use: Never   Drug use: No      Review of Systems Constitutional: No fever/chills, dizzy Eyes: No visual changes. ENT: No sore throat. Cardiovascular: Denies chest pain. Respiratory: Denies shortness of breath. Gastrointestinal: No abdominal pain.  Positive nausea, no vomiting.  No diarrhea.  No constipation. Genitourinary: Negative for dysuria. Musculoskeletal: Negative for back pain. Skin: Negative for rash. Neurological: Negative for headaches, focal weakness or numbness. All other ROS negative ____________________________________________   PHYSICAL EXAM:  VITAL SIGNS: ED Triage Vitals [06/17/21 1005]  Enc Vitals Group     BP (!) 152/67     Pulse Rate 63     Resp 18     Temp 98.1 F (36.7 C)      Temp Source Oral     SpO2 96 %     Weight 173 lb (78.5 kg)     Height '4\' 9"'$  (1.448 m)     Head Circumference      Peak Flow      Pain Score 9     Pain Loc      Pain Edu?      Excl. in [place]?     Constitutional: Alert and oriented. Well appearing and in no acute distress. Eyes: Conjunctivae are normal. EOMI. no nystagmus.  No field cuts. Head: Atraumatic.  TMs with a little bit of wax in them but not completely impacted. Nose: Surgical neck scar without any redness or erythema Mouth/Throat: Mucous membranes are moist.   Neck: No stridor. Trachea Midline. FROM Cardiovascular: Normal rate, regular rhythm. Grossly normal heart sounds.  Good peripheral circulation. Respiratory: Normal respiratory effort.  No retractions. Lungs CTAB. Gastrointestinal: Soft and nontender. No distention. No abdominal bruits.  Musculoskeletal: No lower extremity tenderness nor edema.  No joint effusions. Neurologic:  Normal speech and language. No gross focal neurologic deficits are appreciated.  Cranial nerves appear intact with normal finger-to-nose. Skin:  Skin is warm, dry and intact. No rash noted. Psychiatric: Mood and affect are normal. Speech and behavior are normal. GU: Deferred   ____________________________________________   LABS (all labs ordered are listed, but only abnormal results are displayed)  Labs Reviewed  BASIC METABOLIC PANEL - Abnormal; Notable for the following components:      Result Value   Potassium 3.3 (*)    All other components within normal limits  CBC - Abnormal; Notable for the following components:   RBC 3.69 (*)    Hemoglobin 11.1 (*)    HCT 34.3 (*)    All other components within normal limits  RESP PANEL BY RT-PCR (FLU A&B, COVID) ARPGX2  URINALYSIS, COMPLETE (UACMP) WITH MICROSCOPIC  CBG MONITORING, ED  TROPONIN I (HIGH SENSITIVITY)   ____________________________________________   ED ECG REPORT I, Vanessa Osseo, the attending physician, personally viewed  and interpreted this ECG.  Normal sinus rate of 69, no ST elevation, no T wave inversions, normal intervals ____________________________________________  RADIOLOGY Robert Bellow, personally viewed and evaluated these images (plain radiographs) as part of my medical decision making, as well as reviewing the written report by the radiologist.  ED MD interpretation:  no pna   Official radiology report(s): MR BRAIN WO CONTRAST  Result Date: 06/17/2021 CLINICAL DATA:  Dizziness, non-specific EXAM: MRI HEAD WITHOUT CONTRAST TECHNIQUE: Multiplanar, multiecho pulse sequences of the brain and surrounding structures were obtained without intravenous contrast. COMPARISON:  CT head Feb 12, 2021.  MRI head October 21, 2020. FINDINGS: Brain: No convincing true restricted diffusion to suggest acute infarct. Mild DWI hyperintensity in the high left parietal lobe has correlate ADC hyperintensity and T2 hyperintensity, therefore favored to represent T2 shine through. No acute hemorrhage, hydrocephalus, mass lesion, midline shift, or extra-axial fluid collection. Few scattered small foci of susceptibility artifact in the left basal ganglia and bilateral cerebral, likely the sequela of prior microhemorrhages. Moderate T2 hyperintensities within the white matter, nonspecific but compatible with chronic microvascular ischemic disease. Small remote lacunar infarct in the pons, right basal ganglia, and right corona radiata. Dilated perivascular spaces inferior basal ganglia bilaterally. Mild for age atrophy. Vascular: Major arterial flow voids are maintained at the skull base. Skull and upper cervical spine: No focal marrow replacing process. Partially imaged cervical fusion hardware. Probable at least moderate canal stenosis at C3-C4, incompletely evaluated. Sinuses/Orbits: Clear sinuses.  No acute orbital findings. Other: No sizable mastoid effusions. IMPRESSION: 1. No evidence of acute intracranial abnormality. 2.  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 3. A few scattered prior microhemorrhages. 4. Probable at least moderate canal stenosis at C3-C4, incompletely evaluated. An MRI of the cervical spine could further characterize if clinically indicated. Electronically Signed   By: Margaretha Sheffield M.D.   On: 06/17/2021 14:14   DG Chest Portable 1 View  Result Date: 06/17/2021 CLINICAL DATA:  Shortness of  breath. EXAM: PORTABLE CHEST 1 VIEW COMPARISON:  Feb 03, 2021. FINDINGS: Stable cardiomegaly. Stable right apical scarring is noted. No acute pulmonary disease is noted. The visualized skeletal structures are unremarkable. IMPRESSION: No active disease. Electronically Signed   By: Marijo Conception M.D.   On: 06/17/2021 14:46    ____________________________________________   PROCEDURES  Procedure(s) performed (including Critical Care):  .1-3 Lead EKG Interpretation Performed by: Vanessa Pillager, MD Authorized by: Vanessa Radisson, MD     Interpretation: normal     ECG rate:  70s   ECG rate assessment: normal     Rhythm: sinus rhythm     Ectopy: none     Conduction: normal     ____________________________________________   INITIAL IMPRESSION / ASSESSMENT AND PLAN / ED COURSE  Chelsea [name] was evaluated in Emergency Department on 06/17/2021 for the symptoms described in the history of present illness. She was evaluated in the context of the global COVID-19 pandemic, which necessitated consideration that the patient might be at risk for infection with the SARS-CoV-2 virus that causes COVID-19. Institutional protocols and algorithms that pertain to the evaluation of patients at risk for COVID-19 are in a state of rapid change based on information released by regulatory bodies including the CDC and federal and state organizations. These policies and algorithms were followed during the patient's care in the ED.    Patient is a 75 year old  who comes in with sudden onset dizziness and nausea.  Suspect this could be related to some vertigo or stress.  She does report having a stressful conversation but given her age and not getting better with the Zofran and meclizine consider the possibility of a stroke therefore we will get MRI to further evaluate.  I also ordered EKG and cardiac markers to evaluate for any evidence of arrhythmia or ACS.  Patient was kept on the cardiac monitor.  Patient denies any shortness of breath but some of her oxygen levels were charted slightly low in the 90 to 93% therefore added on a COVID swab and chest x-ray.  It sounds of these oxygen levels are more like when she was asleep.  Patient states that she is not having any shortness of breath and reports that she gets frequent COVID swabs and that they have all been negative.  She declines repeat testing today.  Her abdomen is soft and nontender and low suspicion for acute abdominal process.  Hemoglobin is stable from baseline.  Troponin is negative.  Attempted to put IV in to give patient medications as well to get a repeat troponin but patient declined IV.  Patient is adamant that she is been poked and prodded to many times due to her recent hospitalization does not want any additional IVs.  She is still willing to get an MRI to make sure there is no evidence of an acute stroke.  It sounds like her symptoms are getting better but occasionally when she turns her head she will have some symptoms therefore it seems to be more like vertigo.  Her MRI does not show any evidence of an acute stroke although does since show some small remote lacunar infarcts.  I did discuss this with patient and she can follow-up with neurology.  I do not think this is an active stroke given patient has had some time without having symptoms and it seems to be mostly associated with her head movement.  Is also some moderate canal stenosis which they stated that could be further  characterized by  MRI of the C-spine.  She has no new cervical issues and is following up with neurosurgery soon for her recent postop therefore I do not feel this needs to be done emergently.  Patient urine does look consistent with UTI.  Patient does have an issue with bladder emptying so this could just be chronic but patient does report some increasing urination frequency that is been going on recently therefore will cover for UTI send for culture and see if it grows anything.  Also give a low-dose of meclizine given her age just help prevent any falls.  Encourage patient to get up and ambulate but patient states that she is pretty much nonambulatory at baseline and that she does not want to ambulate here in the emergency room.  At this time she feels comfortable going home.  She does report a little bit of a headache which I given her some Tylenol for.      ____________________________________________   FINAL CLINICAL IMPRESSION(S) / ED DIAGNOSES   Final diagnoses:  Cystitis  Dizziness      MEDICATIONS GIVEN DURING THIS VISIT:  Medications  meclizine (ANTIVERT) tablet 25 mg (25 mg Oral Given 06/17/21 1120)  potassium chloride SA (KLOR-CON) CR tablet 40 mEq (40 mEq Oral Given 06/17/21 1119)  ondansetron (ZOFRAN-ODT) disintegrating tablet 4 mg (4 mg Oral Given 06/17/21 1119)  acetaminophen (TYLENOL) tablet 1,000 mg (1,000 mg Oral Given 06/17/21 1454)     ED Discharge Orders          Ordered    cephALEXin (KEFLEX) 500 MG capsule  2 times daily        06/17/21 1519    meclizine (ANTIVERT) 12.5 MG tablet  3 times daily PRN        06/17/21 1519             Note:  This document was prepared using Dragon voice recognition software and may include unintentional dictation errors.    Vanessa Trenton, MD 06/17/21 [phone]

## 2021-06-17 NOTE — Discharge Instructions (Addendum)
The dizziness could be from a potential UTI versus some vertigo.  I prescribed you some meclizine that helps with the dizziness.  You can try and see if that helps.  She can follow-up with ENT if is not getting better but sometimes it just goes away with time.  Try to make small movements and give yourself time to equilibrate.  The MRI did not show any evidence of active stroke but did show some signs of old very small strokes that he can follow-up with neurology for.  I did give you their number to make a follow-up appointment.  Please return to the ER for worsening symptoms or any other concern   IMPRESSION: 1. No evidence of acute intracranial abnormality. 2. 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 3. A few scattered prior microhemorrhages. 4. Probable at least moderate canal stenosis at C3-C4, incompletely evaluated. An MRI of the cervical spine could further characterize if clinically indicated.

## 2021-06-17 NOTE — ED Notes (Signed)
Patient calling out for doctor. When asked if RN can relay a message to doctor patient states "I want to speak to the doctor, that is the message." Patient yelling at RN to get doctor and leave door open. MD aware.

## 2021-06-20 LAB — URINE CULTURE: Culture: >=100000 — AB

## 2021-06-22 NOTE — Progress Notes (Signed)
Pharmacy ED Culture   75YOF PMH urinary incontinence presenting with dizziness/nausea. UA resulting large leukocytes, + nitrites, few bacteria. Renal function stable at that time, CrCl > 50. Discharged on cephalexin 500 mg BID x 5 days.  Urine culture: E. Coli (> 100,000 colonies); R cefazolin, S nitrofurantoin  Recommended Macrobid 100 mg BID x 5 days  Provider accepted. Called patient, [facility] in [facility]. Per patient, plan is for sitter to pick up prescription from pharmacy.    Wynelle Cleveland, PharmD Pharmacy Resident  06/22/2021 2:54 PM

## 2021-07-02 ENCOUNTER — Other Ambulatory Visit: Payer: Self-pay | Admitting: Internal Medicine

## 2021-07-02 ENCOUNTER — Other Ambulatory Visit: Payer: Self-pay | Admitting: Family Medicine

## 2021-07-02 DIAGNOSIS — M4807 Spinal stenosis, lumbosacral region: Secondary | ICD-10-CM

## 2021-07-07 ENCOUNTER — Other Ambulatory Visit: Payer: Self-pay | Admitting: Internal Medicine

## 2021-07-07 NOTE — Telephone Encounter (Signed)
Medication Refill - Medication: Meclizine   Has the patient contacted their pharmacy? Yes.   Pt states that she contacted the pharmacy and that they do not have this on file for pt. She states that this medication was given to her in the ED. Pt states that she is out of this medication. Please advise.  (Agent: If no, request that the patient contact the pharmacy for the refill.) (Agent: If yes, when and what did the pharmacy advise?)  Preferred Pharmacy (with phone number or street name):  [id number] [address], [place] - [address]  [place] [address]  Phone: [phone] Fax: [phone]  Hours: Not open 24 hours   Has the patient been seen for an appointment in the last year OR does the patient have an upcoming appointment? Yes.    Agent: Please be advised that RX refills may take up to 3 business days. We ask that you follow-up with your pharmacy.

## 2021-07-08 ENCOUNTER — Encounter: Payer: Self-pay | Admitting: Gastroenterology

## 2021-07-08 ENCOUNTER — Ambulatory Visit: Payer: Medicare Other | Admitting: Gastroenterology

## 2021-07-08 MED ORDER — MECLIZINE HCL 12.5 MG PO TABS: 12.5000 mg | ORAL_TABLET | Freq: Three times a day (TID) | ORAL | 0 refills | Status: DC | PRN

## 2021-07-08 NOTE — Telephone Encounter (Signed)
Requested medications are due for refill today requesting at 3 weeks  Requested medications are on the active medication list yes  Last visit 05/27/21  Future visit scheduled 09/04/21  Notes to clinic Med is not delegated, this was ordered at an ED visit, unsure if to be continued.

## 2021-07-14 ENCOUNTER — Ambulatory Visit: Payer: Self-pay | Admitting: *Deleted

## 2021-07-14 NOTE — Telephone Encounter (Signed)
Patient called requesting an earlier hospital follow-up from September when she was admitted with dizziness and UTI-concerned if she will continue to get refills on meclizine that she was prescribed in the hospital. Stated she thought she was going to get a referral to the ENT but has not heard anything. Reviewed safety measures for her dizziness she continues to experience intermittently. She has completed an antibiotic for the UTI but continues to wear a pull-up. Scheduled OV for this Wednesday.    Reason for Disposition  [1] MODERATE dizziness (e.g., interferes with normal activities) AND [2] has been evaluated by physician for this  Answer Assessment - Initial Assessment Questions 1. DESCRIPTION: "Describe your dizziness."     lightheaded 2. LIGHTHEADED: "Do you feel lightheaded?" (e.g., somewhat faint, woozy, weak upon standing)     woozy 3. VERTIGO: "Do you feel like either you or the room is spinning or tilting?" (i.e. vertigo)     sometimes 4. SEVERITY: "How bad is it?"  "Do you feel like you are going to faint?" "Can you stand and walk?"   - MILD: Feels slightly dizzy, but walking normally.   - MODERATE: Feels unsteady when walking, but not falling; interferes with normal activities (e.g., school, work).   - SEVERE: Unable to walk without falling, or requires assistance to walk without falling; feels like passing out now.      Walks with assist 5. ONSET:  "When did the dizziness begin?"     Before last hospital visit 6. AGGRAVATING FACTORS: "Does anything make it worse?" (e.g., standing, change in head position)     yes 7. HEART RATE: "Can you tell me your heart rate?" "How many beats in 15 seconds?"  (Note: not all patients can do this)       no 8. CAUSE: "What do you think is causing the dizziness?"     unsure 9. RECURRENT SYMPTOM: "Have you had dizziness before?" If Yes, ask: "When was the last time?" "What happened that time?"     Yes, hospitalized recently 10. OTHER  SYMPTOMS: "Do you have any other symptoms?" (e.g., fever, chest pain, vomiting, diarrhea, bleeding)       no 11. PREGNANCY: "Is there any chance you are pregnant?" "When was your last menstrual period?"       no  Protocols used: Dizziness - Lightheadedness-A-AH

## 2021-07-16 ENCOUNTER — Ambulatory Visit (INDEPENDENT_AMBULATORY_CARE_PROVIDER_SITE_OTHER): Payer: Medicare Other | Admitting: Internal Medicine

## 2021-07-16 ENCOUNTER — Encounter: Payer: Self-pay | Admitting: Internal Medicine

## 2021-07-16 ENCOUNTER — Other Ambulatory Visit: Payer: Self-pay

## 2021-07-16 VITALS — BP 120/56 | HR 75 | Temp 97.1°F | Resp 18 | Ht <= 58 in | Wt 179.2 lb

## 2021-07-16 DIAGNOSIS — N3000 Acute cystitis without hematuria: Secondary | ICD-10-CM

## 2021-07-16 DIAGNOSIS — R42 Dizziness and giddiness: Secondary | ICD-10-CM | POA: Diagnosis not present

## 2021-07-16 DIAGNOSIS — N3941 Urge incontinence: Secondary | ICD-10-CM | POA: Diagnosis not present

## 2021-07-16 DIAGNOSIS — R519 Headache, unspecified: Secondary | ICD-10-CM

## 2021-07-16 NOTE — Progress Notes (Signed)
Subjective:    Patient ID: Chelsea [name], female    DOB: 1945-11-05, 75 y.o.   MRN: [id number]  HPI  Patient presents to clinic today for ER follow-up.  She presented to the ER 9/13 with sudden onset dizziness and nausea.  She felt like she was going to pass out but did not.  Her labs showed a slightly low potassium of 3.3% and slight anemia which is stable according to her recent labs.  She tested negative for the flu and COVID.  Troponins were negative.  ECG did not show any signs of acute ACS.  MRI brain showed:  IMPRESSION: 1. No evidence of acute intracranial abnormality. 2. 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 3. A few scattered prior microhemorrhages. 4. Probable at least moderate canal stenosis at C3-C4, incompletely evaluated.   Chest x-ray was negative for acute findings.  Her urine was concerning for a UTI.  She was discharged with a prescription for meclizine and advised to follow-up with neurology as an outpatient.  Since that time she reports new onset headache in the left temporal region.  She describes the pain as a thumping.  She still has associated lightheadedness but denies the room actually spinning.  She has not noticed any vision changes but reports she does need to get her eyes checked.  She is having neck pain status post cervical surgery.  She has a follow-up with neurosurgery next week.  She reports urinary incontinence but denies urinary frequency, dysuria, urgency or blood in her urine.  Review of Systems     Past Medical History:  Diagnosis Date   Anxiety    Arthritis    Depression    Hyperlipidemia    Hypertension    Seizures ([place])    Spinal stenosis    Ulcer    Urinary incontinence     Current Outpatient Medications  Medication Sig Dispense Refill   acetaminophen (TYLENOL) 500 MG tablet Take 500 mg by mouth every 6 (six) hours as needed.     ALPRAZolam (XANAX) 0.5 MG tablet Take 1  tablet (0.5 mg total) by mouth 2 (two) times daily as needed for anxiety. 60 tablet 0   aspirin 325 MG tablet Take 325 mg by mouth daily.     atorvastatin (LIPITOR) 40 MG tablet Take 1 tablet (40 mg total) by mouth daily. 90 tablet 3   busPIRone (BUSPAR) 5 MG tablet Take 1 tablet (5 mg total) by mouth 3 (three) times daily. 270 tablet 1   Cyanocobalamin (VITAMIN B 12 PO) Take 500 mcg by mouth daily.     diclofenac Sodium (VOLTAREN) 1 % GEL Apply 4 g topically 4 (four) times daily. 4 g 0   FLUoxetine (PROZAC) 20 MG capsule TAKE 3 CAPSULES (60 MG TOTAL) BY MOUTH EVERY MORNING. 180 capsule 0   gabapentin (NEURONTIN) 100 MG capsule TAKE 2 CAPSULES BY MOUTH 2 TIMES DAILY. 360 capsule 1   lidocaine (LIDODERM) 5 % Place 1 patch onto the skin daily. Remove & Discard patch within 12 hours or as directed by MD 1 patch 0   meclizine (ANTIVERT) 12.5 MG tablet Take 1 tablet (12.5 mg total) by mouth 3 (three) times daily as needed for dizziness. 30 tablet 0   methocarbamol (ROBAXIN) 500 MG tablet Take 500 mg by mouth every 6 (six) hours as needed for muscle spasms. 1-2 tabs every 6 hours PRN.     polyethylene glycol powder (GLYCOLAX/MIRALAX) 17 GM/SCOOP powder Take  17 g by mouth daily. 3350 g 1   triamterene-hydrochlorothiazide (MAXZIDE-25) 37.5-25 MG tablet TAKE 1 TABLET BY MOUTH EVERY DAY 90 tablet 1   vitamin E 180 MG (400 UNITS) capsule Take 400 Units by mouth daily.     No current facility-administered medications for this visit.    Allergies  Allergen Reactions   Penicillins     "Everything turned black"    Family History  Problem Relation Age of Onset   Arthritis Mother    Heart disease Mother    Stroke Mother    Hypertension Mother    COPD Mother    Sudden death Sister    Other Father        unknown medical history   Breast cancer Neg Hx     Social History   Socioeconomic History   Marital status: Divorced    Spouse name: Not on file   Number of children: Not on file   Years of  education: Not on file   Highest education level: Not on file  Occupational History   Not on file  Tobacco Use   Smoking status: Never   Smokeless tobacco: Never  Vaping Use   Vaping Use: Never used  Substance and Sexual Activity   Alcohol use: Never   Drug use: No   Sexual activity: Not on file  Other Topics Concern   Not on file  Social History Narrative   Not on file   Social Determinants of Health   Financial Resource Strain: Not on file  Food Insecurity: Not on file  Transportation Needs: Not on file  Physical Activity: Not on file  Stress: Not on file  Social Connections: Not on file  Intimate Partner Violence: Not on file     Constitutional: Patient reports headache.  Denies fever, malaise, fatigue, or abrupt weight changes.  HEENT: Denies eye pain, eye redness, ear pain, ringing in the ears, wax buildup, runny nose, nasal congestion, bloody nose, or sore throat. Respiratory: Denies difficulty breathing, shortness of breath, cough or sputum production.   Cardiovascular: Denies chest pain, chest tightness, palpitations or swelling in the hands or feet.  Gastrointestinal: Denies abdominal pain, bloating, constipation, diarrhea or blood in the stool.  GU: Patient reports urinary incontinence.  Denies urgency, frequency, pain with urination, burning sensation, blood in urine, odor or discharge. Musculoskeletal: Patient reports neck pain.  Denies decrease in range of motion, muscle pain or joint swelling.  Skin: Denies redness, rashes, lesions or ulcercations.  Neurological: Patient reports intermittent lightheadedness.  Denies difficulty with memory, difficulty with speech or problems with balance and coordination.    No other specific complaints in a complete review of systems (except as listed in HPI above).  Objective:   Physical Exam   Blood pressure (!) 120/56, pulse 75, temperature (!) 97.1 F (36.2 C), temperature source Temporal, resp. rate 18, height 4\' 9"   (1.448 m), weight 179 lb 3.2 oz (81.3 kg), SpO2 100 %.  Wt Readings from Last 3 Encounters:  06/17/21 173 lb (78.5 kg)  06/16/21 178 lb (80.7 kg)  05/27/21 178 lb 9.6 oz (81 kg)    General: Appears her stated age, obese, in NAD. Skin: Warm, dry and intact.  HEENT: Head: normal shape and size; Eyes: PERRLA and EOMs intact; Cardiovascular: Normal rate and rhythm. S1,S2 noted.  No murmur, rubs or gallops noted.  Pulmonary/Chest: Normal effort and positive vesicular breath sounds. No respiratory distress. No wheezes, rales or ronchi noted.  Abdomen: Soft and nontender.  Musculoskeletal: Gait slow and steady with use of cane. Neurological: Alert and oriented.  Coordination normal however it does take some coaching on how to repeat the fine motor tests. Psychiatric: Mood and affect flat.  Behavior is normal. Judgment and thought content normal.     BMET    Component Value Date/Time   NA 138 06/17/2021 1008   K 3.3 (L) 06/17/2021 1008   CL 101 06/17/2021 1008   CO2 27 06/17/2021 1008   GLUCOSE 97 06/17/2021 1008   BUN 14 06/17/2021 1008   CREATININE 0.77 06/17/2021 1008   CREATININE 0.87 08/13/2020 1129   CALCIUM 9.1 06/17/2021 1008   GFRNONAA >60 06/17/2021 1008   GFRNONAA 65 08/13/2020 1129   GFRAA 76 08/13/2020 1129    Lipid Panel     Component Value Date/Time   CHOL 238 (H) 02/26/2020 1130   TRIG 84 02/26/2020 1130   HDL 60 02/26/2020 1130   CHOLHDL 4.0 02/26/2020 1130   VLDL 18.4 02/08/2013 1638   LDLCALC 159 (H) 02/26/2020 1130    CBC    Component Value Date/Time   WBC 4.1 06/17/2021 1008   RBC 3.69 (L) 06/17/2021 1008   HGB 11.1 (L) 06/17/2021 1008   HCT 34.3 (L) 06/17/2021 1008   PLT 212 06/17/2021 1008   MCV 93.0 06/17/2021 1008   MCH 30.1 06/17/2021 1008   MCHC 32.4 06/17/2021 1008   RDW 13.7 06/17/2021 1008   LYMPHSABS 2.5 02/12/2021 0209   MONOABS 0.6 02/12/2021 0209   EOSABS 0.2 02/12/2021 0209   BASOSABS 0.0 02/12/2021 0209    Hgb A1C No  results found for: HGBA1C        Assessment & Plan:   ER follow-up for Lightheadedness, Acute Cystitis, Urge Incontinence, New Onset Headache:  ER notes, labs and imaging reviewed She was unable to give [place] a urine sample today to check for UTI resolution Will D/C her Triamterene HCT to see if this helps with urgent continence No indication at this time to repeat antibiotics There were some abnormalities on her MRI scan which did show concern for previous stroke which could be causing her her lightheadedness and new onset headache Referral to neurology placed for further evaluation She will follow-up with neurosurgery next week as scheduled  Return precautions discussed Webb Silversmith, NP This visit occurred during the SARS-CoV-2 public health emergency.  Safety protocols were in place, including screening questions prior to the visit, additional usage of staff PPE, and extensive cleaning of exam room while observing appropriate contact time as indicated for disinfecting solutions.

## 2021-07-17 ENCOUNTER — Encounter: Payer: Self-pay | Admitting: Internal Medicine

## 2021-07-17 DIAGNOSIS — Z981 Arthrodesis status: Secondary | ICD-10-CM | POA: Diagnosis not present

## 2021-07-17 DIAGNOSIS — Z9889 Other specified postprocedural states: Secondary | ICD-10-CM | POA: Diagnosis not present

## 2021-07-17 DIAGNOSIS — M47812 Spondylosis without myelopathy or radiculopathy, cervical region: Secondary | ICD-10-CM | POA: Diagnosis not present

## 2021-07-17 NOTE — Patient Instructions (Signed)
General Headache Without Cause A headache is pain or discomfort that is felt around the head or neck area. There are many causes and types of headaches. In some cases, the cause may not be found. Follow these instructions at home: Watch your condition for any changes. Let your doctor know about them. Take these steps to help with your condition: Managing pain   Take over-the-counter and prescription medicines only as told by your doctor. Lie down in a dark, quiet room when you have a headache. If told, put ice on your head and neck area: Put ice in a plastic bag. Place a towel between your skin and the bag. Leave the ice on for 20 minutes, 2-3 times per day. If told, put heat on the affected area. Use the heat source that your doctor recommends, such as a moist heat pack or a heating pad. Place a towel between your skin and the heat source. Leave the heat on for 20-30 minutes. Remove the heat if your skin turns bright red. This is very important if you are unable to feel pain, heat, or cold. You may have a greater risk of getting burned. Keep lights dim if bright lights bother you or make your headaches worse. Eating and drinking Eat meals on a regular schedule. If you drink alcohol: Limit how much you use to: 0-1 drink a day for women. 0-2 drinks a day for men. Be aware of how much alcohol is in your drink. In the [place] one drink equals one 12 oz bottle of beer (355 mL), one 5 oz glass of wine (148 mL), or one 1 oz glass of hard liquor (44 mL). Stop drinking caffeine, or reduce how much caffeine you drink. General instructions  Keep a journal to find out if certain things bring on headaches. For example, write down: What you eat and drink. How much sleep you get. Any change to your diet or medicines. Get a massage or try other ways to relax. Limit stress. Sit up straight. Do not tighten (tense) your muscles. Do not use any products that contain nicotine or tobacco. This includes  cigarettes, e-cigarettes, and chewing tobacco. If you need help quitting, ask your doctor. Exercise regularly as told by your doctor. Get enough sleep. This often means 7-9 hours of sleep each night. Keep all follow-up visits as told by your doctor. This is important. Contact a doctor if: Your symptoms are not helped by medicine. You have a headache that feels different than the other headaches. You feel sick to your stomach (nauseous) or you throw up (vomit). You have a fever. Get help right away if: Your headache gets very bad quickly. Your headache gets worse after a lot of physical activity. You keep throwing up. You have a stiff neck. You have trouble seeing. You have trouble speaking. You have pain in the eye or ear. Your muscles are weak or you lose muscle control. You lose your balance or have trouble walking. You feel like you will pass out (faint) or you pass out. You are mixed up (confused). You have a seizure. Summary A headache is pain or discomfort that is felt around the head or neck area. There are many causes and types of headaches. In some cases, the cause may not be found. Keep a journal to help find out what causes your headaches. Watch your condition for any changes. Let your doctor know about them. Contact a doctor if you have a headache that is different from usual, or   if your headache is not helped by medicine. Get help right away if your headache gets very bad, you throw up, you have trouble seeing, you lose your balance, or you have a seizure. This information is not intended to replace advice given to you by your health care provider. Make sure you discuss any questions you have with your health care provider. Document Revised: 04/11/2018 Document Reviewed: 04/11/2018 Elsevier Patient Education  2022 Elsevier Inc.  

## 2021-07-28 ENCOUNTER — Other Ambulatory Visit: Payer: Self-pay | Admitting: Internal Medicine

## 2021-07-28 NOTE — Telephone Encounter (Signed)
Requested Prescriptions  Pending Prescriptions Disp Refills  . FLUoxetine (PROZAC) 20 MG capsule [Pharmacy Med Name: FLUOXETINE HCL 20 MG CAPSULE] 90 capsule 0    Sig: TAKE 3 CAPSULES (60 MG TOTAL) BY MOUTH EVERY MORNING.     Psychiatry:  Antidepressants - SSRI Passed - 07/28/2021  1:12 PM      Passed - Completed PHQ-2 or PHQ-9 in the last 360 days      Passed - Valid encounter within last 6 months    Recent Outpatient Visits          1 week ago [facility] [place], Coralie Keens, NP   2 months ago Cervical myelopathy [facility])   [facility], Coralie Keens, NP   4 months ago Colonic mass   [place], Coralie Keens, NP   4 months ago GAD (generalized anxiety disorder)   [facility], NP   5 months ago Chronic venous insufficiency   [facility] [place], Devonne Doughty, DO      Future Appointments            In 1 week Virgel Manifold, MD [place]   In 1 month [place], Coralie Keens, NP [facility], [place]   In [place], Nicki Reaper, [facility]

## 2021-08-02 ENCOUNTER — Other Ambulatory Visit: Payer: Self-pay | Admitting: Internal Medicine

## 2021-08-02 DIAGNOSIS — F411 Generalized anxiety disorder: Secondary | ICD-10-CM

## 2021-08-02 NOTE — Telephone Encounter (Signed)
Requested medication (s) are due for refill today: yes  Requested medication (s) are on the active medication list: yes  Last refill:  05/27/21 #60  Future visit scheduled: yes  Notes to clinic:  med not delegated to NT to RF   Requested Prescriptions  Pending Prescriptions Disp Refills   ALPRAZolam (XANAX) 0.5 MG tablet [Pharmacy Med Name: ALPRAZOLAM 0.5 MG TABLET] 60 tablet 0    Sig: TAKE 1 TABLET BY MOUTH TWICE A DAY AS NEEDED FOR ANXIETY     Not Delegated - Psychiatry:  Anxiolytics/Hypnotics Failed - 08/02/2021  1:17 PM      Failed - This refill cannot be delegated      Failed - Urine Drug Screen completed in last 360 days      Passed - Valid encounter within last 6 months    Recent Outpatient Visits           2 weeks ago [facility] [place], Coralie Keens, NP   2 months ago Cervical myelopathy [facility])   [facility] [place], Coralie Keens, NP   4 months ago Colonic mass   [place], Coralie Keens, NP   4 months ago GAD (generalized anxiety disorder)   [facility] [place], Coralie Keens, NP   5 months ago Chronic venous insufficiency   [place], DO       Future Appointments             In 5 days Virgel Manifold, MD [place]   In 1 month [place], Coralie Keens, NP [facility], [place]   In 1 month [place], Nicki Reaper, [facility]

## 2021-08-04 ENCOUNTER — Telehealth: Payer: Self-pay | Admitting: *Deleted

## 2021-08-04 NOTE — Telephone Encounter (Signed)
Left message for pt to return my call.

## 2021-08-04 NOTE — Telephone Encounter (Signed)
Pt ran out of samples pt will pick up more samples. Pt aware samples left up front.

## 2021-08-04 NOTE — Telephone Encounter (Signed)
Patient states she is urinating all night long. Is there something she can take to help with this. She saw you last month .

## 2021-08-06 DIAGNOSIS — R42 Dizziness and giddiness: Secondary | ICD-10-CM | POA: Diagnosis not present

## 2021-08-06 DIAGNOSIS — R519 Headache, unspecified: Secondary | ICD-10-CM | POA: Diagnosis not present

## 2021-08-07 ENCOUNTER — Other Ambulatory Visit: Payer: Self-pay

## 2021-08-07 ENCOUNTER — Ambulatory Visit (INDEPENDENT_AMBULATORY_CARE_PROVIDER_SITE_OTHER): Payer: Medicare Other | Admitting: Gastroenterology

## 2021-08-07 ENCOUNTER — Encounter: Payer: Self-pay | Admitting: Gastroenterology

## 2021-08-07 ENCOUNTER — Telehealth: Payer: Self-pay

## 2021-08-07 VITALS — BP 154/83 | HR 79 | Temp 98.4°F | Wt 185.0 lb

## 2021-08-07 DIAGNOSIS — K635 Polyp of colon: Secondary | ICD-10-CM

## 2021-08-07 NOTE — Telephone Encounter (Signed)
Received message from Dr. Bonna Gains that Chelsea [name] is now interested in being seen by surgeon for her abnormal CT from June. Called [facility] to assist in arranging appointment. They are able to utilize referral from July. They will call her with appointment.

## 2021-08-08 ENCOUNTER — Telehealth: Payer: Self-pay

## 2021-08-08 NOTE — Telephone Encounter (Signed)
Per Oncology  Received message from Dr. Bonna Gains that Chelsea [name] is now interested in being seen by surgeon for her abnormal CT from June. Called [facility] to assist in arranging appointment. They are able to utilize referral from July. They will call her with appointment.

## 2021-08-09 NOTE — Progress Notes (Signed)
Chelsea Antigua, MD [address]  [place]  [place], [place] [address]  Main: [phone]  Fax: [phone]   Primary Care Physician: Jearld Fenton, NP    Chief complaint: Rectal polyp  HPI: Chelsea [name] is a 75 y.o. female previously seen at the time of her outpatient colonoscopy.  Patient was referred to oncology and surgery due to unresectable colon polyp.  Roderic Ovens, help coordinate her visit, but patient was no-show for her surgery appointment and her previous appointment with [place] as well.  Patient states because she was having cervical spine surgery, she prioritized that over her colon surgery.  The patient denies abdominal or flank pain, anorexia, nausea or vomiting, dysphagia, change in bowel habits or black or bloody stools or weight loss.  Colonoscopy April 04, 2021  Impression:            - Rule out malignancy, tumor in the cecum. Biopsied.                        - Terminal ileum was briefly intubated and appeared                         normal.                        - Diverticulosis in the sigmoid colon.                        - The rectum, sigmoid colon, descending colon,                         transverse colon and ascending colon are normal.                        - Non-bleeding internal hemorrhoids.   DIAGNOSIS:  A. COLON MASS, CECUM; COLD BIOPSY:  - TUBULOVILLOUS ADENOMA WITH FOCAL HIGH-GRADE DYSPLASIA.  - SEE COMMENT.   Comment:  There is no definitive evidence of invasive carcinoma in this sample.  However, the biopsy may not be representative of the entire underlying  lesion.  Clinical correlation is recommended.  Deeper levels were  examined.   ROS: All ROS reviewed and negative except as per HPI   Past Medical History:  Diagnosis Date   Anxiety    Arthritis    Depression    Hyperlipidemia    Hypertension    Seizures ([place])    Spinal stenosis    Ulcer    Urinary incontinence     Past Surgical History:  Procedure  Laterality Date   ABDOMINAL HYSTERECTOMY     BACK SURGERY     due to polio   BREAST BIOPSY Bilateral    neg   BREAST BIOPSY Right 2011   neg/stereo   CARPAL TUNNEL RELEASE     CATARACT EXTRACTION Right 2020   COLONOSCOPY WITH PROPOFOL N/A 04/04/2021   Procedure: COLONOSCOPY WITH PROPOFOL;  Surgeon: Virgel Manifold, MD;  Location: [facility] ENDOSCOPY;  Service: Endoscopy;  Laterality: N/A;   EYE SURGERY      Prior to Admission medications   Medication Sig Start Date End Date Taking? Authorizing Provider  acetaminophen (TYLENOL) 500 MG tablet Take 500 mg by mouth every 6 (six) hours as needed.   Yes [provider]  ALPRAZolam (XANAX) 0.5 MG tablet TAKE 1 TABLET  BY MOUTH TWICE A DAY AS NEEDED FOR ANXIETY 08/04/21  Yes Jearld Fenton, NP  aspirin 325 MG tablet Take 325 mg by mouth daily.   Yes [provider]  atorvastatin (LIPITOR) 40 MG tablet Take 1 tablet (40 mg total) by mouth daily. 02/26/21  Yes Baity, Coralie Keens, NP  busPIRone (BUSPAR) 5 MG tablet Take 1 tablet (5 mg total) by mouth 3 (three) times daily. 04/11/21  Yes Baity, Coralie Keens, NP  Cyanocobalamin (VITAMIN B 12 PO) Take 500 mcg by mouth daily.   Yes [provider]  diclofenac Sodium (VOLTAREN) 1 % GEL Apply 4 g topically 4 (four) times daily. 12/17/20  Yes Kathrine Haddock, NP  FLUoxetine (PROZAC) 20 MG capsule TAKE 3 CAPSULES (60 MG TOTAL) BY MOUTH EVERY MORNING. 07/28/21  Yes Baity, Coralie Keens, NP  gabapentin (NEURONTIN) 100 MG capsule TAKE 2 CAPSULES BY MOUTH 2 TIMES DAILY. 07/02/21  Yes Karamalegos, Alexander J, DO  lidocaine (LIDODERM) 5 % Place 1 patch onto the skin daily. Remove & Discard patch within 12 hours or as directed by MD 10/24/20  Yes Max Sane, MD  meclizine (ANTIVERT) 12.5 MG tablet Take 1 tablet (12.5 mg total) by mouth 3 (three) times daily as needed for dizziness. 07/08/21  Yes Baity, Coralie Keens, NP  methocarbamol (ROBAXIN) 500 MG tablet Take 500 mg by mouth every 6 (six) hours as needed for  muscle spasms. 1-2 tabs every 6 hours PRN.   Yes [provider]  polyethylene glycol powder (GLYCOLAX/MIRALAX) 17 GM/SCOOP powder Take 17 g by mouth daily. 01/28/21  Yes Jearld Fenton, NP  vitamin E 180 MG (400 UNITS) capsule Take 400 Units by mouth daily.   Yes [provider]    Family History  Problem Relation Age of Onset   Arthritis Mother    Heart disease Mother    Stroke Mother    Hypertension Mother    COPD Mother    Sudden death Sister    Other Father        unknown medical history   Breast cancer Neg Hx      Social History   Tobacco Use   Smoking status: Never   Smokeless tobacco: Never  Vaping Use   Vaping Use: Never used  Substance Use Topics   Alcohol use: Never   Drug use: No    Allergies as of 08/07/2021 - Review Complete 08/07/2021  Allergen Reaction Noted   Penicillins  02/06/2011    Physical Examination:  Constitutional: General:   Alert,  Well-developed, well-nourished, pleasant and cooperative in NAD BP (!) 154/83   Pulse 79   Temp 98.4 F (36.9 C) (Oral)   Wt 185 lb (83.9 kg)   BMI 40.03 kg/m   Respiratory: Normal respiratory effort  Gastrointestinal:  Soft, non-tender and non-distended without masses, hepatosplenomegaly or hernias noted.  No guarding or rebound tenderness.     Cardiac: No clubbing or edema.  No cyanosis. Normal posterior tibial pedal pulses noted.  Psych:  Alert and cooperative. Normal mood and affect.  Musculoskeletal:  Normal gait. Head normocephalic, atraumatic. Symmetrical without gross deformities. 5/5 Lower extremity strength bilaterally.  Skin: Warm. Intact without significant lesions or rashes. No jaundice.  Neck: Supple, trachea midline  Lymph: No cervical lymphadenopathy  Psych:  Alert and oriented x3, Alert and cooperative. Normal mood and affect.  Labs: CMP     Component Value Date/Time   NA 138 06/17/2021 1008   K 3.3 (L) 06/17/2021 1008   CL  101 06/17/2021 1008   CO2 27  06/17/2021 1008   GLUCOSE 97 06/17/2021 1008   BUN 14 06/17/2021 1008   CREATININE 0.77 06/17/2021 1008   CREATININE 0.87 08/13/2020 1129   CALCIUM 9.1 06/17/2021 1008   PROT 7.9 03/23/2021 1916   ALBUMIN 3.8 03/23/2021 1916   AST 15 03/23/2021 1916   ALT 12 03/23/2021 1916   ALKPHOS 69 03/23/2021 1916   BILITOT 0.7 03/23/2021 1916   GFRNONAA >60 06/17/2021 1008   GFRNONAA 65 08/13/2020 1129   GFRAA 76 08/13/2020 1129   Lab Results  Component Value Date   WBC 4.1 06/17/2021   HGB 11.1 (L) 06/17/2021   HCT 34.3 (L) 06/17/2021   MCV 93.0 06/17/2021   PLT 212 06/17/2021    Imaging Studies:   Assessment and Plan:   Chelsea [name] is a 75 y.o. y/o female who underwent an outpatient colonoscopy with an unresectable polyp seen in the cecum, showing high-grade dysplasia on biopsies and patient was scheduled to follow-up with surgery and [place] earlier this year, however, patient was no-show for her appointments  Importance of follow-up was discussed with her in detail.  Patient is willing to reschedule her surgery appointments.  I have messaged, oncology, Beckey Rutter, NP that she saw previously.  I have also messaged Roderic Ovens to try and coordinate her surgery appointments again.  Patient would like for me to call her daughter to discuss her further plan of care with her.  I tried calling her daughter to 3 times a day, but was unable to reach her and her voicemail box is full so I was unable to leave voicemail.  Pt agreeable to the above plan of care and close follow up in clinic has also been made for her. Surgery appt with Dr. Christian Mate made on Nov 15  Dr Chelsea [name]

## 2021-08-12 ENCOUNTER — Telehealth: Payer: Self-pay

## 2021-08-12 NOTE — Telephone Encounter (Signed)
Pt's daughter [name] called to get clarification on why patient needed to see general surgery. [name] stated that she read the notes from 08/07/2021 OV where Dr Bonna Gains stated that she tried to call daughter 3 times with no answer. [name] said she does not recall any missed calls from [place].   I explained that following colonoscopy from the hospital, there was a mass that was not able to be removed via scope. Pt was referred to general surgery for evaluation for this and possible removal... Pt has canceled 2 appts and no showed 1.... Daughter expressed understanding and stated that she will ensure pt makes to appt on 11/15 with general surgery

## 2021-08-19 ENCOUNTER — Other Ambulatory Visit
Admission: RE | Admit: 2021-08-19 | Discharge: 2021-08-19 | Disposition: A | Discharge status: Home / Self Care | Payer: Medicare Other | Source: Ambulatory Visit | Attending: Surgery | Admitting: Surgery

## 2021-08-19 ENCOUNTER — Ambulatory Visit (INDEPENDENT_AMBULATORY_CARE_PROVIDER_SITE_OTHER): Payer: Medicare Other | Admitting: Surgery

## 2021-08-19 ENCOUNTER — Encounter: Payer: Self-pay | Admitting: Surgery

## 2021-08-19 ENCOUNTER — Other Ambulatory Visit: Payer: Self-pay

## 2021-08-19 VITALS — BP 154/84 | HR 76 | Temp 98.6°F | Ht <= 58 in | Wt 178.0 lb

## 2021-08-19 DIAGNOSIS — R1907 Generalized intra-abdominal and pelvic swelling, mass and lump: Secondary | ICD-10-CM | POA: Diagnosis not present

## 2021-08-19 DIAGNOSIS — K6389 Other specified diseases of intestine: Secondary | ICD-10-CM

## 2021-08-19 LAB — CREATININE, SERUM
Creatinine, Ser: 0.91 mg/dL (ref 0.44–1.00)
GFR, Estimated: >60 mL/min (ref >60)

## 2021-08-19 LAB — BUN: BUN: 14 mg/dL (ref 8–23)

## 2021-08-19 MED ORDER — NEOMYCIN SULFATE 500 MG PO TABS: 1000.0000 mg | ORAL_TABLET | Freq: Three times a day (TID) | ORAL | 0 refills | Status: DC

## 2021-08-19 MED ORDER — METRONIDAZOLE 500 MG PO TABS: ORAL_TABLET | ORAL | 0 refills | Status: DC

## 2021-08-19 MED ORDER — BISACODYL 5 MG PO TBEC: 5.0000 mg | DELAYED_RELEASE_TABLET | Freq: Once | ORAL | 0 refills | Status: AC

## 2021-08-19 MED ORDER — POLYETHYLENE GLYCOL 3350 17 GM/SCOOP PO POWD: 1.0000 | Freq: Once | ORAL | 0 refills | Status: AC

## 2021-08-19 NOTE — Progress Notes (Signed)
Patient ID: Chelsea [name], female   DOB: 02-10-1946, 75 y.o.   MRN: [id number]  Chief Complaint: Cecal neoplasm  History of Present Illness Chelsea [name] is a 75 y.o. female with a biopsy cecal neoplasm, diagnosed on colonoscopy as noted below.  Lesion was identified on CT scan obtained for other issues that led to a posterior cervical spinal decompression and fusion of C3-C6.  She has been convalescence since then.  She has not had any blood per rectum, no remarkable weight loss, has some vague right lower quadrant ballooning sensation/discomfort on palpation/urinary incontinence that goes with palpation in this area.  She reports very minimal bowel activity per rectum. She was also noted to have a mass associated with her vaginal cuff anterior rectal area, that may benefit from further evaluation and imaging as its been 4 months ago. She presents today with her daughter.    Colonoscopy report findings from April 04, 2021: The perianal and digital rectal examinations were normal.      A frond-like/villous non-obstructing large mass was found in the cecum.       The mass was partially circumferential (involving one-half of the lumen       circumference). No bleeding was present. Biopsies were taken with a cold       forceps for histology.      The exam was otherwise normal throughout the examined colon.      Terminal ileum was briefly intubated and appeared normal.      Diverticula were found in the sigmoid colon.      The rectum, sigmoid colon, descending colon, transverse colon and       ascending colon appeared normal.      Non-bleeding internal hemorrhoids were found during retroflexion.  Verlon Au, NP at 04/28/2021  9:41 AM  Status: Signed    Referral to surgery placed to discuss management of colon mass, possible colectomy. Should patient undergo surgery, I can coordinate with Dr. Theora Gianotti for possible intraoperative assessment of gyn findings. Call placed to patient to  discuss. No answer. Left voicemail.       Past Medical History Past Medical History:  Diagnosis Date   Anxiety    Arthritis    Depression    Hyperlipidemia    Hypertension    Seizures ([place])    Spinal stenosis    Ulcer    Urinary incontinence       Past Surgical History:  Procedure Laterality Date   ABDOMINAL HYSTERECTOMY     BACK SURGERY     due to polio   BREAST BIOPSY Bilateral    neg   BREAST BIOPSY Right 2011   neg/stereo   CARPAL TUNNEL RELEASE     CATARACT EXTRACTION Right 2020   COLONOSCOPY WITH PROPOFOL N/A 04/04/2021   Procedure: COLONOSCOPY WITH PROPOFOL;  Surgeon: Virgel Manifold, MD;  Location: [facility] ENDOSCOPY;  Service: Endoscopy;  Laterality: N/A;   EYE SURGERY      Allergies  Allergen Reactions   Penicillins     "Everything turned black"    Current Outpatient Medications  Medication Sig Dispense Refill   acetaminophen (TYLENOL) 500 MG tablet Take 500 mg by mouth every 6 (six) hours as needed.     ALPRAZolam (XANAX) 0.5 MG tablet TAKE 1 TABLET BY MOUTH TWICE A DAY AS NEEDED FOR ANXIETY 60 tablet 0   aspirin 325 MG tablet Take 325 mg by mouth daily.     atorvastatin (LIPITOR) 40 MG tablet Take 1 tablet (  40 mg total) by mouth daily. 90 tablet 3   busPIRone (BUSPAR) 5 MG tablet Take 1 tablet (5 mg total) by mouth 3 (three) times daily. 270 tablet 1   Cyanocobalamin (VITAMIN B 12 PO) Take 500 mcg by mouth daily.     diclofenac Sodium (VOLTAREN) 1 % GEL Apply 4 g topically 4 (four) times daily. 4 g 0   FLUoxetine (PROZAC) 20 MG capsule TAKE 3 CAPSULES (60 MG TOTAL) BY MOUTH EVERY MORNING. 90 capsule 0   gabapentin (NEURONTIN) 100 MG capsule TAKE 2 CAPSULES BY MOUTH 2 TIMES DAILY. 360 capsule 1   lidocaine (LIDODERM) 5 % Place 1 patch onto the skin daily. Remove & Discard patch within 12 hours or as directed by MD 1 patch 0   meclizine (ANTIVERT) 12.5 MG tablet Take 1 tablet (12.5 mg total) by mouth 3 (three) times daily as needed for dizziness. 30  tablet 0   methocarbamol (ROBAXIN) 500 MG tablet Take 500 mg by mouth every 6 (six) hours as needed for muscle spasms. 1-2 tabs every 6 hours PRN.     polyethylene glycol powder (GLYCOLAX/MIRALAX) 17 GM/SCOOP powder Take 17 g by mouth daily. 3350 g 1   triamterene-hydrochlorothiazide (MAXZIDE-25) 37.5-25 MG tablet Take 1 tablet by mouth daily.     vitamin E 180 MG (400 UNITS) capsule Take 400 Units by mouth daily.     No current facility-administered medications for this visit.    Family History Family History  Problem Relation Age of Onset   Arthritis Mother    Heart disease Mother    Stroke Mother    Hypertension Mother    COPD Mother    Sudden death Sister    Other Father        unknown medical history   Breast cancer Neg Hx       Social History Social History   Tobacco Use   Smoking status: Never   Smokeless tobacco: Never  Vaping Use   Vaping Use: Never used  Substance Use Topics   Alcohol use: Never   Drug use: No        Review of Systems  Constitutional: Negative.   HENT: Negative.    Eyes:  Positive for blurred vision.  Respiratory: Negative.    Cardiovascular:  Positive for leg swelling.  Gastrointestinal: Negative.   Genitourinary:  Positive for dysuria and urgency.  Skin: Negative.   Neurological: Negative.   Psychiatric/Behavioral: Negative.       Physical Exam Blood pressure (!) 154/84, pulse 76, temperature 98.6 F (37 C), temperature source Oral, height 4\' 9"  (1.448 m), weight 178 lb (80.7 kg), SpO2 93 %. Last Weight  Most recent update: 08/19/2021  1:43 PM    Weight  80.7 kg (178 lb)             CONSTITUTIONAL: Well developed, and nourished, appropriately responsive and aware without distress.   EYES: Sclera non-icteric.   EARS, NOSE, MOUTH AND THROAT: Mask worn. Hearing is intact to voice.  NECK: Trachea is midline, and there is no jugular venous distension.  LYMPH NODES:  Lymph nodes in the neck are not enlarged. RESPIRATORY:   Lungs are clear, and breath sounds are equal bilaterally. Normal respiratory effort without pathologic use of accessory muscles. CARDIOVASCULAR: Heart is regular in rate and rhythm. GI: The abdomen is soft, nontender, and nondistended. There were no palpable masses. MUSCULOSKELETAL: No bony deformities or contractures noted.Marland Kitchen    SKIN: Skin turgor is normal. No pathologic skin lesions  appreciated.  NEUROLOGIC:  Cranial nerves are grossly without defect. PSYCH:  Alert and oriented to person, place and time. Affect is appropriate for situation.  Data Reviewed I have personally reviewed what is currently available of the patient's imaging, recent labs and medical records.   Labs:  CBC Latest Ref Rng & Units 06/17/2021 03/23/2021 02/12/2021  WBC 4.0 - 10.5 K/uL 4.1 4.8 5.1  Hemoglobin 12.0 - 15.0 g/dL 11.1(L) 11.7(L) 10.9(L)  Hematocrit 36.0 - 46.0 % 34.3(L) 36.6 35.3(L)  Platelets 150 - 400 K/uL 212 237 222   CMP Latest Ref Rng & Units 06/17/2021 03/23/2021 02/12/2021  Glucose 70 - 99 mg/dL 97 103(H) 104(H)  BUN 8 - 23 mg/dL 14 19 19   Creatinine 0.44 - 1.00 mg/dL 0.77 0.99 0.93  Sodium 135 - 145 mmol/L 138 137 139  Potassium 3.5 - 5.1 mmol/L 3.3(L) 3.8 3.9  Chloride 98 - 111 mmol/L 101 101 99  CO2 22 - 32 mmol/L 27 28 33(H)  Calcium 8.9 - 10.3 mg/dL 9.1 9.5 9.6  Total Protein 6.5 - 8.1 g/dL - 7.9 7.5  Total Bilirubin 0.3 - 1.2 mg/dL - 0.7 0.5  Alkaline Phos 38 - 126 U/L - 69 80  AST 15 - 41 U/L - 15 21  ALT 0 - 44 U/L - 12 17   SURGICAL PATHOLOGY  CASE: [id number]  PATIENT: Howard Pouch  Surgical Pathology Report   Specimen Submitted:  A. Colon mass, Cecum; cbx   Clinical History: Colonic mass K63.89. Colon mass, hemorrhoids   DIAGNOSIS:  A. COLON MASS, CECUM; COLD BIOPSY:  - TUBULOVILLOUS ADENOMA WITH FOCAL HIGH-GRADE DYSPLASIA.  - SEE COMMENT.   Comment:  There is no definitive evidence of invasive carcinoma in this sample.  However, the biopsy may not be  representative of the entire underlying  lesion.  Clinical correlation is recommended.  Deeper levels were  examined.    Imaging: Radiology review:  EXAM: CT PELVIS WITH CONTRAST   TECHNIQUE: Multidetector CT imaging of the pelvis was performed using the standard protocol following the bolus administration of intravenous contrast.   CONTRAST:  185mL OMNIPAQUE IOHEXOL 300 MG/ML  SOLN   COMPARISON:  None.   FINDINGS: Urinary Tract: There is diffuse urinary bladder wall thickening. Mild bilateral hydroureter.   Bowel: There is abnormal eccentric soft tissue of the right cecum (3:16).   Vascular/Lymphatic: Calcific aortic atherosclerosis.   Reproductive: Low attenuation mass in the region of the vaginal cuff is again noted.   Other:  None   Musculoskeletal: Facet arthrosis.   IMPRESSION: 1. Abnormal eccentric soft tissue of the right cecum, concerning for colon carcinoma. Colonoscopy recommended. 2. Low attenuation mass in the region of the vaginal cuff is again noted. Unfortunately, the intravenous contrast is not particularly helpful in this case. Pelvic ultrasound or pelvic MRI with and without contrast may provide more information. 3. Diffuse urinary bladder wall thickening with mild bilateral hydroureter, possibly cystitis.   Aortic Atherosclerosis (ICD10-I70.0).     Electronically Signed   By: Ulyses Jarred M.D.   On: 03/24/2021 03:24  CLINICAL DATA:  75 year old female status post hysterectomy with mass inseparable from the vaginal cough and distal colon on CT earlier today.   EXAM: ULTRASOUND PELVIS TRANSVAGINAL   TECHNIQUE: Transvaginal ultrasound examination of the pelvis was performed including evaluation of the uterus, ovaries, adnexal regions, and pelvic cul-de-sac.   COMPARISON:  CT Abdomen and Pelvis without and pelvis CT with contrast since 0015 hours today.   FINDINGS: Uterus  Surgically absent   Right ovary   Not identified on  this exam   Left ovary   Not identified on this exam   Other findings:   Echogenic debris throughout the distended urinary bladder (image 2).   Lobulated mixed echogenicity solid and vascular soft tissue mass seems to be eccentric to the vaginal cough and located to the left of midline (image 4) measuring 3.6 x 3.8 x 2.4 cm. See cine series 2 and 3.   No pelvic free fluid.   IMPRESSION: 1. Lobulated solid 3.8 cm mass confirmed by ultrasound. This is most compatible with a neoplasm but remains indeterminate as to the origin as the epicenter seems to be outside of both the vaginal cuff and the adjacent colon, while the lesion appears inseparable from both. 2. Extensive echogenic debris within the distended urinary bladder.     Electronically Signed   By: Genevie Ann M.D.   On: 03/24/2021 05:34 Within last 24 hrs: No results found.  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 04/28/2021   Stage 3a chronic kidney disease ([place]) 03/25/2021   Aortic atherosclerosis ([place]) 03/25/2021   Urinary incontinence, mixed 03/25/2021   Class 2 severe obesity due to excess calories with serious comorbidity and body mass index (BMI) of 37.0 to 37.9 in adult [facility]) 03/16/2021   Cervical myelopathy ([place]) 01/28/2021   Mixed incontinence 01/28/2021   Chronic anemia 09/26/2018   Depression, major, recurrent, in partial remission ([place]) 01/11/2018   Pure hypercholesterolemia 01/11/2018   GAD (generalized anxiety disorder) 05/14/2011   Essential hypertension 02/06/2011   Spinal stenosis of lumbosacral region 02/06/2011    Plan    We will proceed with repeat CT imaging, as it has been 4 months since her last evaluation. We discussed at length proceeding with robotic right hemicolectomy. We also discussed coordinating further evaluation of the pelvic lesion with Dr. Theora Gianotti.    Risks  included in the procedure but not limited by it: Anesthesia, bleeding, potential anastomotic leak, infection, cardiovascular or cerebrovascular risks.  Rehabilitation challenges etc.  Questions have been answered I believe to the satisfaction of mother and daughter.  We will attempt to coordinate surgery to follow the CT imaging, possibly early in December.  Face-to-face time spent with the patient and accompanying care providers(if present) was 50 minutes, with more than 50% of the time spent counseling, educating, and coordinating care of the patient.    These notes generated with voice recognition software. I apologize for typographical errors.  Ronny Bacon M.D., FACS 08/19/2021, 1:44 PM

## 2021-08-19 NOTE — Patient Instructions (Addendum)
CT scan sched 08/21/21 @ 7:45 am. [facility] [address] [place] [address]   Please pick up your prep kit today for the CT scan. Do eat/drink 4 hours prior to having the scan. Please go directly to the lab today to have labs done.  You may stop at the radiology desk and pick up your prep kit for the CT scan after you have your Labs done.    Dr.Rodenberg will call you with the results.   Please follow the directions on your bowel prep sheet.   Our surgery scheduler will call you within 24-48 hours to schedule your surgery. Please have the Blue surgery sheet available when speaking with her.   Please pick up your medications at the pharmacy later today. 

## 2021-08-20 ENCOUNTER — Telehealth: Payer: Self-pay

## 2021-08-20 NOTE — Telephone Encounter (Signed)
Copied from [place] [id number]. Topic: Quick Communication - Rx Refill/Question >> Aug 20, 2021 12:13 PM Pawlus, Brayton Layman A wrote: Medication: Pt was recently prescribed neomycin (MYCIFRADIN) 500 MG tablet and pt had some follow up questions as to why she needs to take this.   I attempted to contact the patient, no answer. I left a detail message on the patient vm.

## 2021-08-21 ENCOUNTER — Other Ambulatory Visit: Payer: Self-pay

## 2021-08-21 ENCOUNTER — Ambulatory Visit
Admission: RE | Admit: 2021-08-21 | Discharge: 2021-08-21 | Disposition: A | Discharge status: Home / Self Care | Payer: Medicare Other | Source: Ambulatory Visit | Attending: Surgery | Admitting: Surgery

## 2021-08-21 DIAGNOSIS — K5730 Diverticulosis of large intestine without perforation or abscess without bleeding: Secondary | ICD-10-CM | POA: Diagnosis not present

## 2021-08-21 DIAGNOSIS — K7689 Other specified diseases of liver: Secondary | ICD-10-CM | POA: Diagnosis not present

## 2021-08-21 DIAGNOSIS — K6389 Other specified diseases of intestine: Secondary | ICD-10-CM

## 2021-08-21 DIAGNOSIS — N3289 Other specified disorders of bladder: Secondary | ICD-10-CM | POA: Diagnosis not present

## 2021-08-21 IMAGING — CT CT ABD-PELV W/ CM
2 of 5 series · 15 of 46 positions shown, 17 images · IV contrast (omnipaque)
Comparison: CT pelvis [DATE]; CT abdomen and pelvis [DATE].

CLINICAL DATA: Colon mass.

EXAM:
CT ABDOMEN AND PELVIS WITH CONTRAST
TECHNIQUE: Multidetector CT imaging of the abdomen and pelvis was performed
using the standard protocol following bolus administration of
intravenous contrast.
CONTRAST:  100mL OMNIPAQUE IOHEXOL 300 MG/ML  SOLN

[Series 2: abd pelvis 5.00 · axial · 0.67mm/px · z∈[-1622,-1267]mm · 12 of 81 slices shown, 14 images]
[im 5/81  soft-tissue]
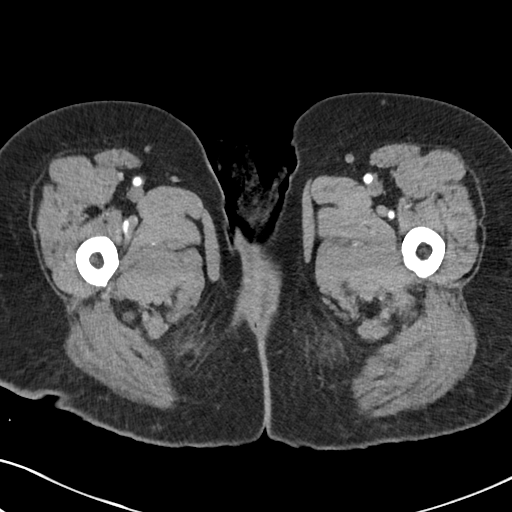
[im 5/81  bone]
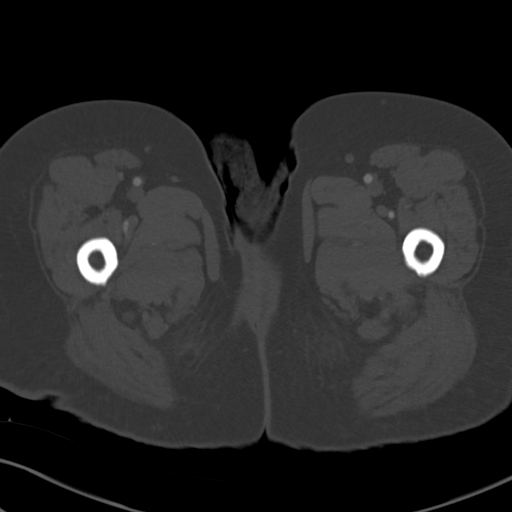
[im 13/81  soft-tissue]
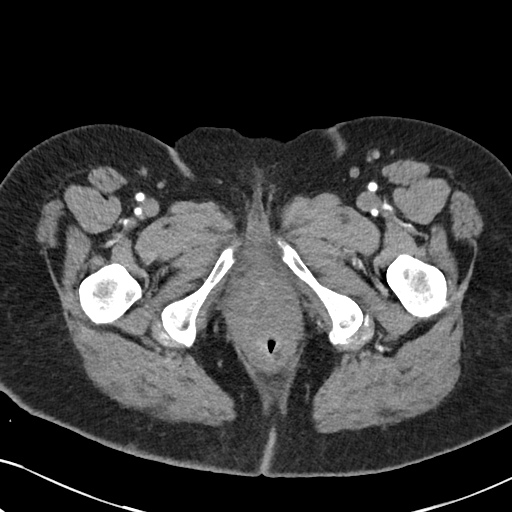
[im 17/81  soft-tissue]
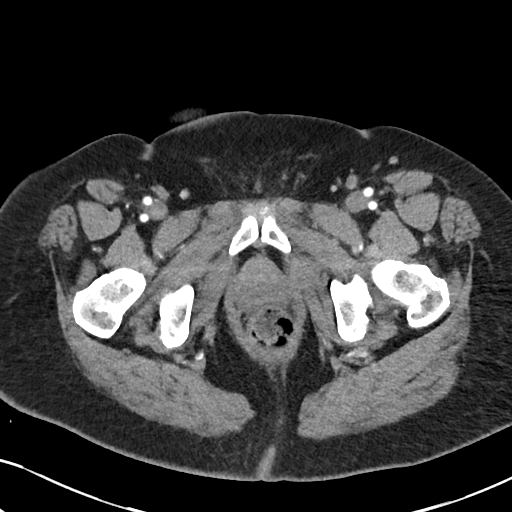
[im 26/81  soft-tissue]
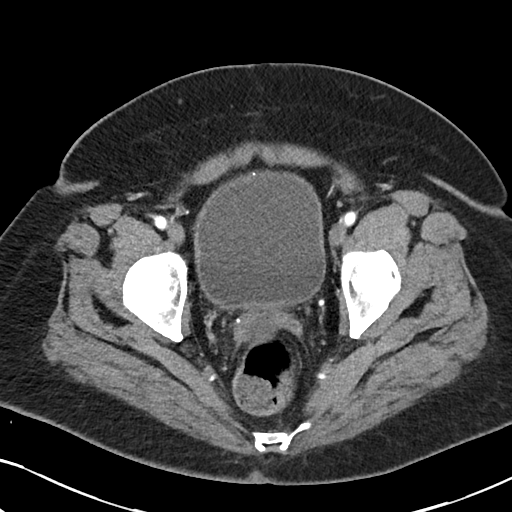
[im 30/81  soft-tissue]
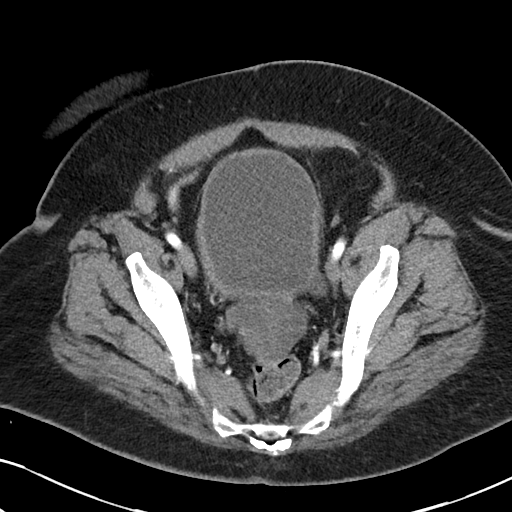
[im 38/81  soft-tissue]
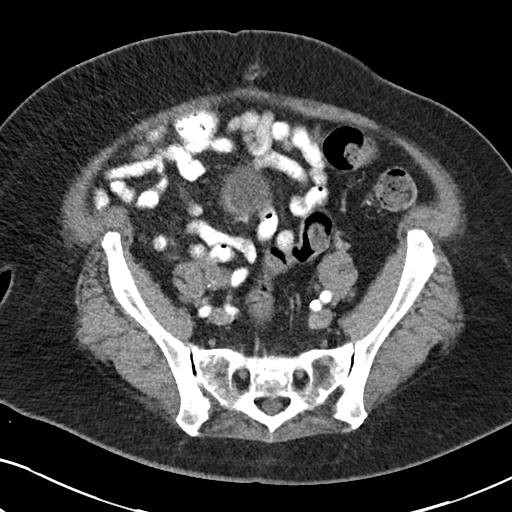
[im 43/81  soft-tissue]
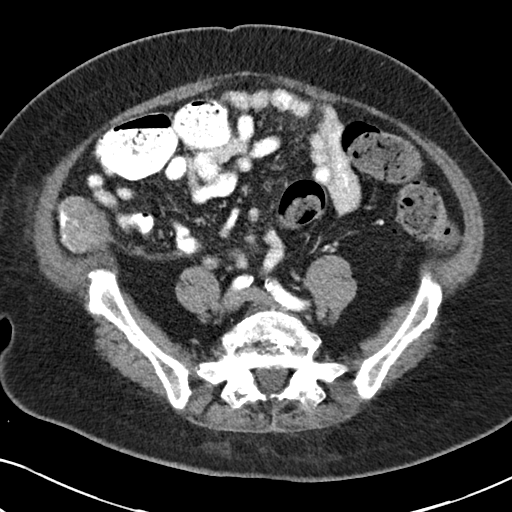
[im 51/81  soft-tissue]
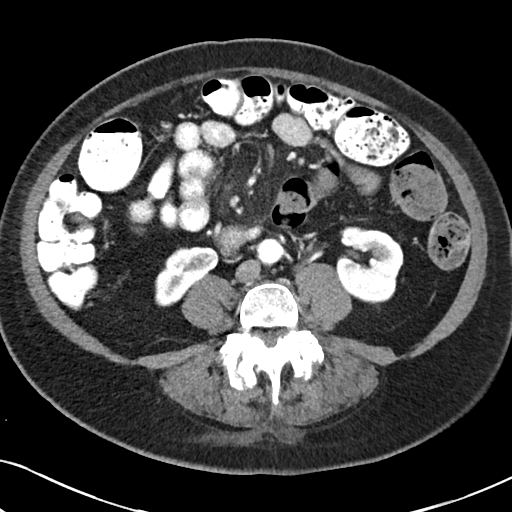
[im 55/81  soft-tissue]
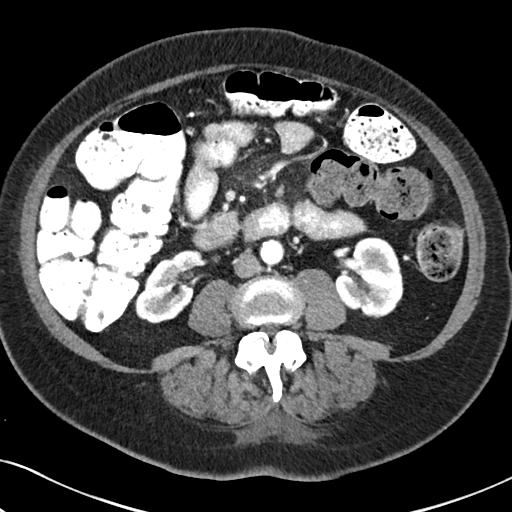
[im 55/81  bone]
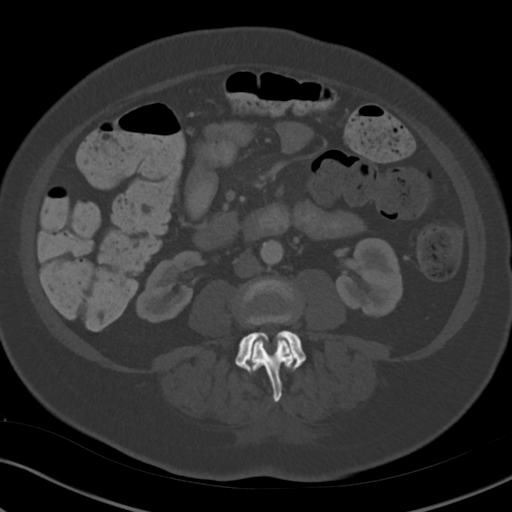
[im 64/81  soft-tissue]
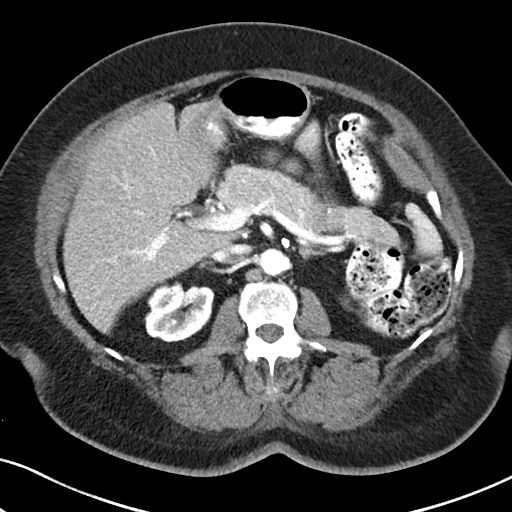
[im 68/81  soft-tissue]
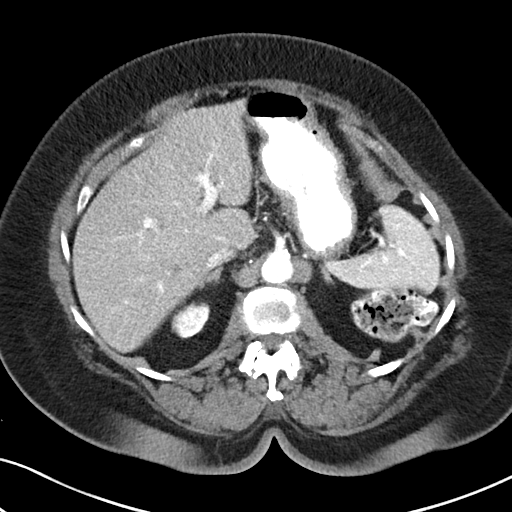
[im 76/81  soft-tissue]
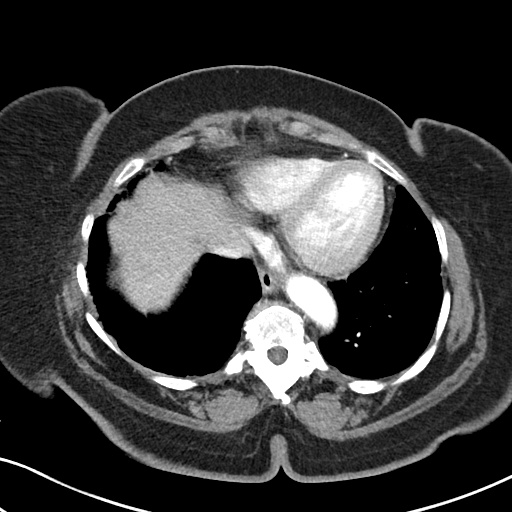

[Series 4: coronals abd pelvis 2.00 cor · coronal · 0.67mm/px · 3 of 158 slices shown]
[im 53/158  soft-tissue]
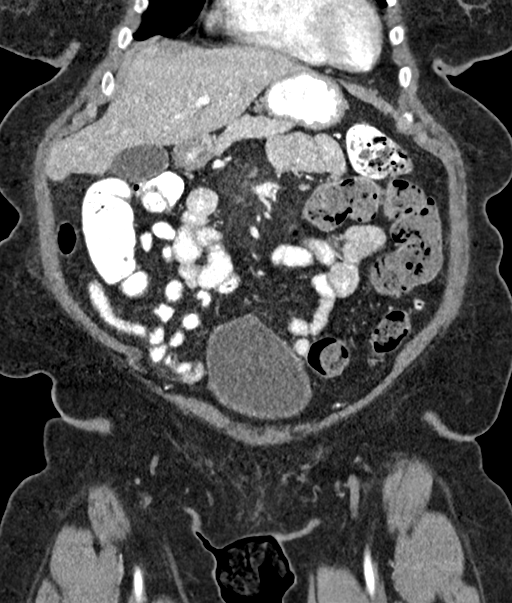
[im 70/158  soft-tissue]
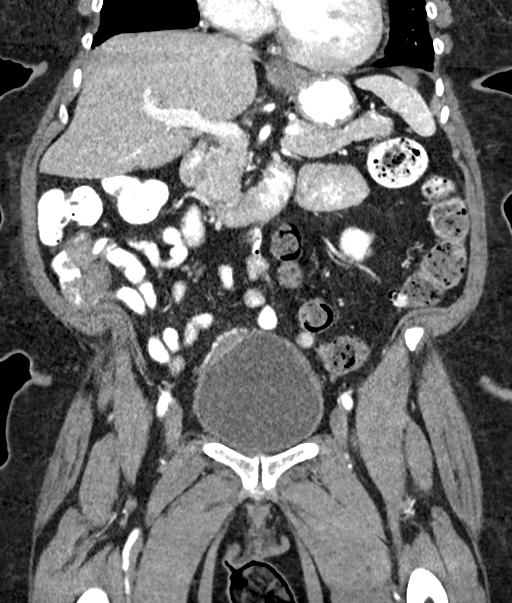
[im 88/158  soft-tissue]
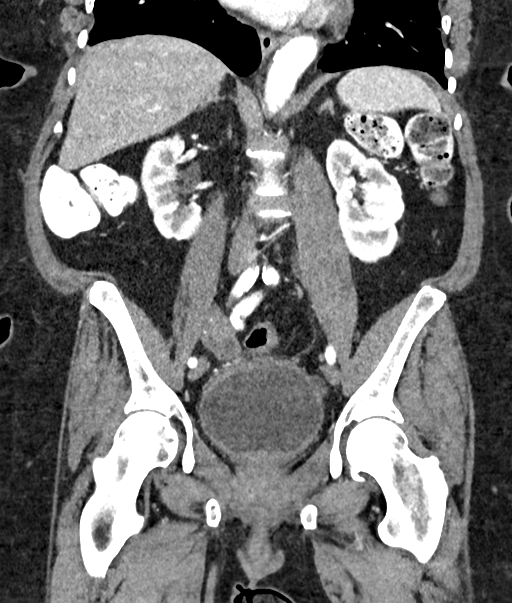

[15 of 46 positions shown; findings below may reference images not displayed]

FINDINGS: Lower chest: Mild bibasilar scarring. Heart size within normal
limits.

Hepatobiliary: Interval development of multiple low-density lesions
throughout the liver, largest within the lateral aspect of the left
hepatic lobe measuring 1.4 cm (series 2, image 11). Gallbladder
unremarkable. No gallstone.

Pancreas: Unremarkable. No pancreatic ductal dilatation or
surrounding inflammatory changes.

Spleen: Normal in size without focal abnormality.

Adrenals/Urinary Tract: No adrenal nodule or mass. Bilateral renal
cortical scarring and atrophy. Nonspecific cortical calcifications
within both kidneys. No hydronephrosis. Mild urinary bladder wall
thickening, improved in appearance from prior. Small left posterior
bladder diverticulum.

Stomach/Bowel: Eccentric lobulated soft tissue mass at the base of
the cecum again seen measuring approximately 5.0 x 3.5 x 4.1 cm.
This appears to have slightly increased in size from the previous
study when accounting for differences in slice selection.
Moderate-large volume of stool throughout the colon. Scattered
colonic diverticulosis. No dilated loops of bowel to suggest
obstruction. No inflammatory changes.

Vascular/Lymphatic: Scattered aortoiliac atherosclerotic
calcifications without aneurysm. No abdominopelvic lymphadenopathy.

Reproductive: Prior hysterectomy. Complex mass in the region of the
vaginal cuff measuring approximately 5.2 x 4.3 x 4.8 cm (previously
approximately 3.7 x 3.4 x 3.4 cm). Right ovary also appears more
prominent on the current exam (series 2, image 47).

Other: No free fluid. No abdominopelvic fluid collection. No
pneumoperitoneum. No abdominal wall hernia.

Musculoskeletal: Facet-predominant degenerative changes within the
lower lumbar spine. No acute bony findings. No suspicious bony
lesion.
IMPRESSION: 1. Soft tissue mass at the base of the cecum measuring up to 5.0 cm
appears to have slightly increased in size from the previous study.
2. Interval development of multiple low-density lesions throughout
the liver, highly suggestive of metastatic disease.
3. Complex mass in the region of the vaginal cuff measuring up to
5.2 cm (previously approximately 3.7 cm). Right ovary also appears
more prominent on the current exam. Contrast enhanced MRI of the
pelvis is recommended, when clinically appropriate.
4. Moderate-large volume of stool throughout the colon.
5. Mild urinary bladder wall thickening, improved in appearance from
prior.
6. Colonic diverticulosis without evidence of acute diverticulitis.
7. Aortic atherosclerosis ([NH]-[NH]).

## 2021-08-21 MED ORDER — IOHEXOL 300 MG/ML  SOLN
100.0000 mL | Freq: Once | INTRAMUSCULAR | Status: AC | PRN
  Administered 2021-08-21: 100 mL via INTRAVENOUS

## 2021-08-22 ENCOUNTER — Telehealth: Payer: Self-pay

## 2021-08-22 NOTE — Telephone Encounter (Signed)
Communicated with Dr. Christian Mate regarding results of CT scan. He will obtain liver biopsy.

## 2021-08-25 ENCOUNTER — Ambulatory Visit (INDEPENDENT_AMBULATORY_CARE_PROVIDER_SITE_OTHER): Payer: Medicare Other | Admitting: Urology

## 2021-08-25 ENCOUNTER — Other Ambulatory Visit: Payer: Self-pay

## 2021-08-25 ENCOUNTER — Other Ambulatory Visit: Payer: Self-pay | Admitting: Internal Medicine

## 2021-08-25 VITALS — BP 136/88 | HR 93 | Wt 180.0 lb

## 2021-08-25 DIAGNOSIS — N3946 Mixed incontinence: Secondary | ICD-10-CM | POA: Diagnosis not present

## 2021-08-25 DIAGNOSIS — F411 Generalized anxiety disorder: Secondary | ICD-10-CM

## 2021-08-25 NOTE — Telephone Encounter (Signed)
Requested medications are due for refill today.  yes  Requested medications are on the active medications list.  yes  Last refill. 08/04/2021  Future visit scheduled.   yes  Notes to clinic.  Medication not delegated. DX code needed.

## 2021-08-25 NOTE — Addendum Note (Signed)
Addended by: Kyra Manges on: 08/25/2021 03:16 PM   Modules accepted: Orders

## 2021-08-25 NOTE — Progress Notes (Signed)
08/25/2021 2:35 PM   Chelsea [name] 01-21-1946 [id number]  Referring provider: Jearld Fenton, NP [address] [place],  [place] [address]  No chief complaint on file.   HPI: Patient was here for a trial of voiding.  She had surgery July 5.  She uses a walker.  She had neck surgery.  She normally voids on her own and has urge and bladder but does not wear pads.  No history of bladder surgery or bladder infections   She saw my partner in May was given Myrbetriq 25 mg for overactive bladder.  She was scanned at the time for 300 mL but she did not void for many hours   Urine culture June 2022 negative and a stop the Myrbetriq  Patient is having leakage after she wakes up.  She gets little bit of leg numbness and cannot get out of bed quickly.  She says she is voiding better and her postvoid residual today was 120 mL.  I will check this again next time.  Clinically not infected     I am not convinced that her bladder is changed but I will dive into her history more detail in 3 months.  I will assess before and after neck surgery bladder function and proceed accordingly  Today I had a lengthy conversation with the patient and I even teased her a little bit because I found the conversation very circular and unclear.  We now both agree that it appeared that she was leaking a lot using up to 9 pads a day but in the last 3 or 4 days is down to 2 pads minimal leakage off the Myrbetriq.  She thinks things got better when she stopped the medicine.  Clinically not infected.  We both agreed watchful waiting at this stage but she would call and we can reassess her if overactive bladder returns.  She understands she may have developed an overactive bladder following neck surgery.   PMH: Past Medical History:  Diagnosis Date   Anxiety    Arthritis    Depression    Hyperlipidemia    Hypertension    Seizures ([place])    Spinal stenosis    Ulcer    Urinary incontinence     Surgical History: Past  Surgical History:  Procedure Laterality Date   ABDOMINAL HYSTERECTOMY     BACK SURGERY     due to polio   BREAST BIOPSY Bilateral    neg   BREAST BIOPSY Right 2011   neg/stereo   CARPAL TUNNEL RELEASE     CATARACT EXTRACTION Right 2020   COLONOSCOPY WITH PROPOFOL N/A 04/04/2021   Procedure: COLONOSCOPY WITH PROPOFOL;  Surgeon: Virgel Manifold, MD;  Location: [facility] ENDOSCOPY;  Service: Endoscopy;  Laterality: N/A;   EYE SURGERY      Home Medications:  Allergies as of 08/25/2021       Reactions   Penicillins    "Everything turned black"        Medication List        Accurate as of August 25, 2021  2:35 PM. If you have any questions, ask your nurse or doctor.          acetaminophen 500 MG tablet Commonly known as: TYLENOL Take 500 mg by mouth every 6 (six) hours as needed.   ALPRAZolam 0.5 MG tablet Commonly known as: XANAX TAKE 1 TABLET BY MOUTH TWICE A DAY AS NEEDED FOR ANXIETY   aspirin 325 MG tablet Take 325  mg by mouth daily.   atorvastatin 40 MG tablet Commonly known as: LIPITOR Take 1 tablet (40 mg total) by mouth daily.   busPIRone 5 MG tablet Commonly known as: BUSPAR Take 1 tablet (5 mg total) by mouth 3 (three) times daily.   diclofenac Sodium 1 % Gel Commonly known as: VOLTAREN Apply 4 g topically 4 (four) times daily.   FLUoxetine 20 MG capsule Commonly known as: PROZAC TAKE 3 CAPSULES (60 MG TOTAL) BY MOUTH EVERY MORNING.   gabapentin 100 MG capsule Commonly known as: NEURONTIN TAKE 2 CAPSULES BY MOUTH 2 TIMES DAILY.   lidocaine 5 % Commonly known as: LIDODERM Place 1 patch onto the skin daily. Remove & Discard patch within 12 hours or as directed by MD   meclizine 12.5 MG tablet Commonly known as: ANTIVERT Take 1 tablet (12.5 mg total) by mouth 3 (three) times daily as needed for dizziness.   methocarbamol 500 MG tablet Commonly known as: ROBAXIN Take 500 mg by mouth every 6 (six) hours as needed for muscle spasms. 1-2 tabs  every 6 hours PRN.   metroNIDAZOLE 500 MG tablet Commonly known as: FLAGYL Take 2 tablets at 8 AM, take 2 tablets at 2 PM, and take 2 tablets at 8 PM the day prior to your surgery.   neomycin 500 MG tablet Commonly known as: MYCIFRADIN Take 2 tablets (1,000 mg total) by mouth 3 (three) times daily.   polyethylene glycol powder 17 GM/SCOOP powder Commonly known as: GLYCOLAX/MIRALAX Take 17 g by mouth daily.   triamterene-hydrochlorothiazide 37.5-25 MG tablet Commonly known as: MAXZIDE-25 Take 1 tablet by mouth daily.   VITAMIN B 12 PO Take 500 mcg by mouth daily.   vitamin E 180 MG (400 UNITS) capsule Take 400 Units by mouth daily.        Allergies:  Allergies  Allergen Reactions   Penicillins     "Everything turned black"    Family History: Family History  Problem Relation Age of Onset   Arthritis Mother    Heart disease Mother    Stroke Mother    Hypertension Mother    COPD Mother    Sudden death Sister    Other Father        unknown medical history   Breast cancer Neg Hx     Social History:  reports that she has never smoked. She has never used smokeless tobacco. She reports that she does not drink alcohol and does not use drugs.  ROS:                                        Physical Exam: There were no vitals taken for this visit.  Constitutional:  Alert and oriented, No acute distress. HEENT: [place] AT, moist mucus membranes.  Trachea midline, no masses.   Laboratory Data: Lab Results  Component Value Date   WBC 4.1 06/17/2021   HGB 11.1 (L) 06/17/2021   HCT 34.3 (L) 06/17/2021   MCV 93.0 06/17/2021   PLT 212 06/17/2021    Lab Results  Component Value Date   CREATININE 0.91 08/19/2021    No results found for: PSA  No results found for: TESTOSTERONE  No results found for: HGBA1C  Urinalysis    Component Value Date/Time   COLORURINE YELLOW (A) 06/17/2021 1008   APPEARANCEUR CLEAR (A) 06/17/2021 1008   LABSPEC  1.005 06/17/2021 1008   PHURINE 7.0  06/17/2021 [place] 06/17/2021 [place] 06/17/2021 [place] 06/17/2021 1008   BILIRUBINUR Negative 01/14/2021 [place] 06/17/2021 1008   PROTEINUR NEGATIVE 06/17/2021 1008   UROBILINOGEN 0.2 01/14/2021 1354   NITRITE POSITIVE (A) 06/17/2021 1008   LEUKOCYTESUR LARGE (A) 06/17/2021 1008    Pertinent Imaging:   Assessment & Plan: We will send her urine for culture and call if positive.  We will see her as needed.  Stay off medication for the time being  There are no diagnoses linked to this encounter.  No follow-ups on file.  Reece Packer, MD  [place] [address], [place], [place] [address] [phone]

## 2021-08-25 NOTE — Addendum Note (Signed)
Addended by: Alvera Novel on: 08/25/2021 02:54 PM   Modules accepted: Orders

## 2021-08-26 ENCOUNTER — Other Ambulatory Visit: Payer: Self-pay | Admitting: Surgery

## 2021-08-26 ENCOUNTER — Telehealth: Payer: Self-pay

## 2021-08-26 ENCOUNTER — Other Ambulatory Visit: Payer: Medicare Other

## 2021-08-26 DIAGNOSIS — N3946 Mixed incontinence: Secondary | ICD-10-CM | POA: Diagnosis not present

## 2021-08-26 DIAGNOSIS — K769 Liver disease, unspecified: Secondary | ICD-10-CM

## 2021-08-26 NOTE — Telephone Encounter (Signed)
I spoke with the patient's daughter, [name], about getting her mother scheduled for a liver biopsy. This is scheduled at [facility] on 09/04/21. Her arrival time at the [facility] is 7:30 am. A nurse will call 1-2 days prior to go over more instructions.

## 2021-08-26 NOTE — Progress Notes (Signed)
Discussed over telephone with patient's daughter [name] to proceed 1 step at a time and biopsy the lesions identified in the liver on her last CT.  I suspect CT guided would be the method used by IR, but if they should prefer or choose ultrasound for scheduling sake I am not opposed to that.

## 2021-08-27 LAB — MICROSCOPIC EXAMINATION: WBC, UA: >30 /hpf — AB (ref 0–5)

## 2021-08-27 LAB — URINALYSIS, COMPLETE
Bilirubin, UA: NEGATIVE
Glucose, UA: NEGATIVE
Ketones, UA: NEGATIVE
Nitrite, UA: POSITIVE — AB
Protein,UA: NEGATIVE
Specific Gravity, UA: 1.02 (ref 1.005–1.030)
Urobilinogen, Ur: 0.2 mg/dL (ref 0.2–1.0)
pH, UA: 6 (ref 5.0–7.5)

## 2021-08-29 ENCOUNTER — Other Ambulatory Visit: Payer: Self-pay | Admitting: Internal Medicine

## 2021-08-30 NOTE — Telephone Encounter (Signed)
Requested Prescriptions  Pending Prescriptions Disp Refills  . FLUoxetine (PROZAC) 20 MG capsule [Pharmacy Med Name: FLUOXETINE HCL 20 MG CAPSULE] 270 capsule 0    Sig: TAKE 3 CAPSULES (60 MG TOTAL) BY MOUTH EVERY MORNING.     Psychiatry:  Antidepressants - SSRI Passed - 08/29/2021 12:30 PM      Passed - Completed PHQ-2 or PHQ-9 in the last 360 days      Passed - Valid encounter within last 6 months    Recent Outpatient Visits          1 month ago [facility] [place], Coralie Keens, NP   3 months ago Cervical myelopathy [facility])   [facility], Coralie Keens, NP   5 months ago Colonic mass   [place], [place], NP   5 months ago GAD (generalized anxiety disorder)   [facility], NP   6 months ago Chronic venous insufficiency   [place], Devonne Doughty, DO      Future Appointments            In 1 week Garnette Gunner, Coralie Keens, NP [facility], [place]   In 3 weeks Virgel Manifold, MD [place]

## 2021-09-01 ENCOUNTER — Other Ambulatory Visit: Payer: Self-pay | Admitting: Internal Medicine

## 2021-09-01 DIAGNOSIS — F411 Generalized anxiety disorder: Secondary | ICD-10-CM

## 2021-09-01 LAB — CULTURE, URINE COMPREHENSIVE

## 2021-09-02 ENCOUNTER — Telehealth: Payer: Self-pay

## 2021-09-02 MED ORDER — NITROFURANTOIN MONOHYD MACRO 100 MG PO CAPS: 100.0000 mg | ORAL_CAPSULE | Freq: Two times a day (BID) | ORAL | 0 refills | Status: DC

## 2021-09-02 NOTE — Telephone Encounter (Signed)
Requested medications are due for refill today yes  Requested medications are on the active medication list yes  Last refill 08/04/21  Last visit 05/27/21  Future visit scheduled 09/11/21  Notes to clinic This medication can not be delegated, please assess. UDS greater than 360 days ago. Requested Prescriptions  Pending Prescriptions Disp Refills   ALPRAZolam (XANAX) 0.5 MG tablet [Pharmacy Med Name: ALPRAZOLAM 0.5 MG TABLET] 60 tablet 0    Sig: TAKE 1 TABLET BY MOUTH TWICE A DAY AS NEEDED FOR ANXIETY     Not Delegated - Psychiatry:  Anxiolytics/Hypnotics Failed - 09/01/2021  9:50 AM      Failed - This refill cannot be delegated      Failed - Urine Drug Screen completed in last 360 days      Passed - Valid encounter within last 6 months    Recent Outpatient Visits           1 month ago [facility] [place], Coralie Keens, NP   3 months ago Cervical myelopathy [facility])   [facility], Coralie Keens, NP   5 months ago Colonic mass   [place], Coralie Keens, NP   5 months ago GAD (generalized anxiety disorder)   [facility] [place], Coralie Keens, NP   6 months ago Chronic venous insufficiency   [place], Devonne Doughty, DO       Future Appointments             In 1 week [place], Coralie Keens, NP [facility] [place]   In 3 weeks Virgel Manifold, MD [place]

## 2021-09-02 NOTE — Telephone Encounter (Signed)
-----   Message from Bjorn Loser, MD sent at 09/02/2021  9:08 AM EST ----- Macrodantin 100 mg twice a day for 7 days Make sure I did not treat this I do not think I have yet  ----- Message ----- From: Alvera Novel, [place]: 09/02/2021   8:02 AM EST To: Bjorn Loser, MD   ----- Message ----- From: Interface, [facility] Lab Results In Sent: 08/27/2021   5:37 AM EST To: [facility]

## 2021-09-02 NOTE — Telephone Encounter (Signed)
Sent medication to pharmacy. Left detailed message.

## 2021-09-03 ENCOUNTER — Other Ambulatory Visit: Payer: Self-pay | Admitting: Radiology

## 2021-09-04 ENCOUNTER — Ambulatory Visit
Admission: RE | Admit: 2021-09-04 | Discharge: 2021-09-04 | Disposition: A | Discharge status: Home / Self Care | Payer: Medicare Other | Source: Ambulatory Visit | Attending: Surgery | Admitting: Surgery

## 2021-09-04 ENCOUNTER — Other Ambulatory Visit: Payer: Self-pay

## 2021-09-04 ENCOUNTER — Ambulatory Visit: Payer: Medicare Other | Admitting: Internal Medicine

## 2021-09-04 ENCOUNTER — Other Ambulatory Visit: Payer: Self-pay | Admitting: Interventional Radiology

## 2021-09-04 DIAGNOSIS — C787 Secondary malignant neoplasm of liver and intrahepatic bile duct: Secondary | ICD-10-CM | POA: Diagnosis not present

## 2021-09-04 DIAGNOSIS — R1900 Intra-abdominal and pelvic swelling, mass and lump, unspecified site: Secondary | ICD-10-CM | POA: Diagnosis not present

## 2021-09-04 DIAGNOSIS — K769 Liver disease, unspecified: Secondary | ICD-10-CM | POA: Insufficient documentation

## 2021-09-04 DIAGNOSIS — K6389 Other specified diseases of intestine: Secondary | ICD-10-CM | POA: Diagnosis not present

## 2021-09-04 DIAGNOSIS — R160 Hepatomegaly, not elsewhere classified: Secondary | ICD-10-CM | POA: Insufficient documentation

## 2021-09-04 DIAGNOSIS — R1909 Other intra-abdominal and pelvic swelling, mass and lump: Secondary | ICD-10-CM | POA: Diagnosis not present

## 2021-09-04 LAB — CBC
HCT: 37.5 % (ref 36.0–46.0)
Hemoglobin: 11.6 g/dL — ABNORMAL LOW (ref 12.0–15.0)
MCH: 28.9 pg (ref 26.0–34.0)
MCHC: 30.9 g/dL (ref 30.0–36.0)
MCV: 93.3 fL (ref 80.0–100.0)
Platelets: 207 10*3/uL (ref 150–400)
RBC: 4.02 MIL/uL (ref 3.87–5.11)
RDW: 14.3 % (ref 11.5–15.5)
WBC: 4.4 10*3/uL (ref 4.0–10.5)
nRBC: 0 % (ref 0.0–0.2)

## 2021-09-04 LAB — PROTIME-INR
INR: 1 (ref 0.8–1.2)
Prothrombin Time: 12.7 seconds (ref 11.4–15.2)

## 2021-09-04 IMAGING — US US BIOPSY CORE LIVER
1 series · 14 of 23 positions shown · non-contrast
Comparison: [DATE]

INDICATION: 75-year-old female with history of indeterminate liver masses in the
setting of a cecal mass and vaginal cuff mass of indeterminate
etiology. Request for liver mass biopsy.

EXAM:
1. Contrast-enhanced ultrasound of the liver.
2. Ultrasound guided liver lesion biopsy.

[Series 1: us biopsy · 23 acquisitions, 14 frames shown]
[im 1/23]
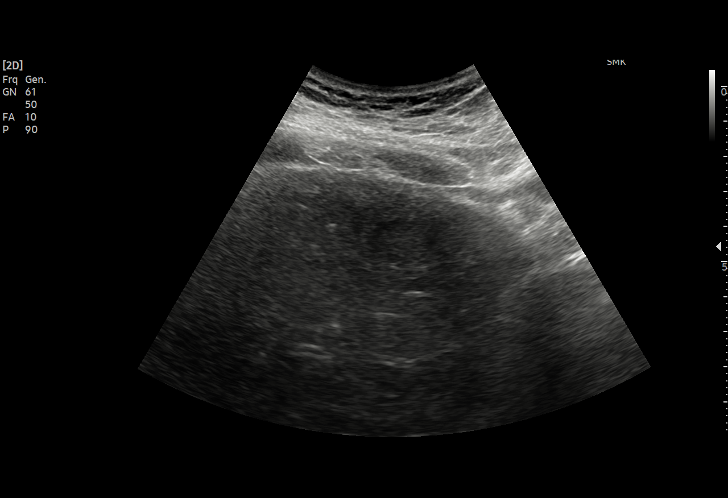
[im 3/23]
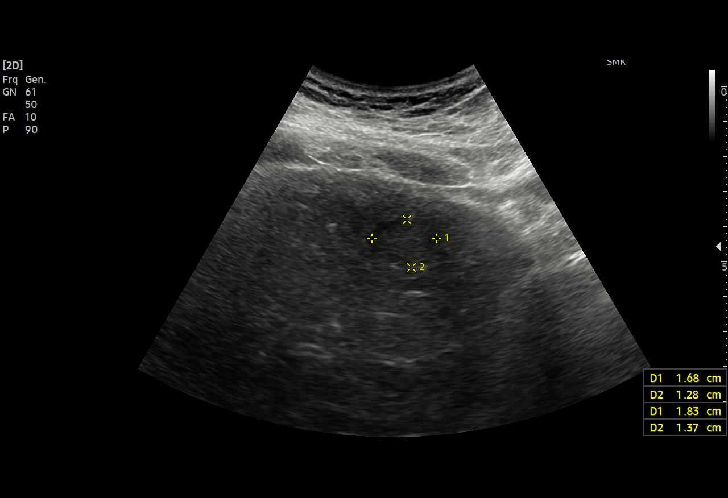
[im 5/23]
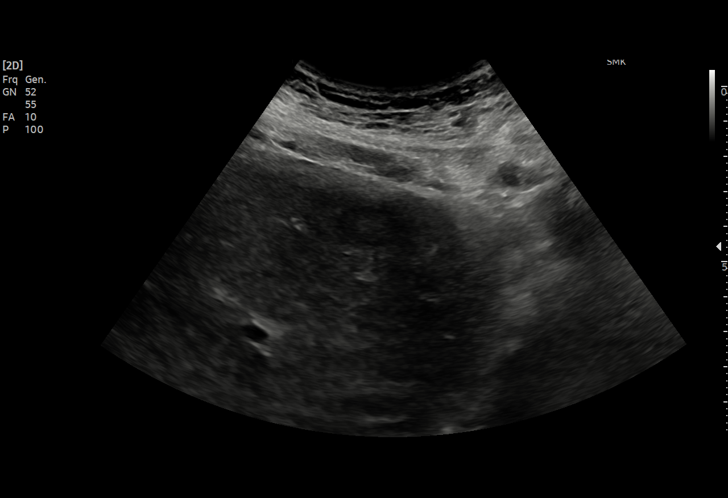
[im 6/23]
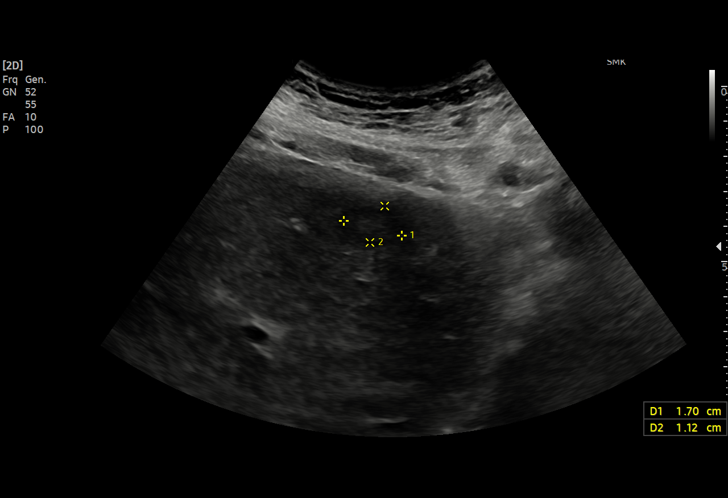
[im 8/23]
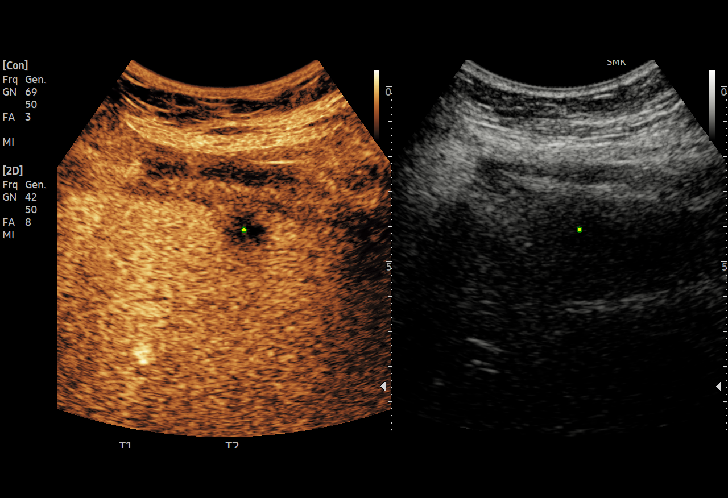
[im 10/23]
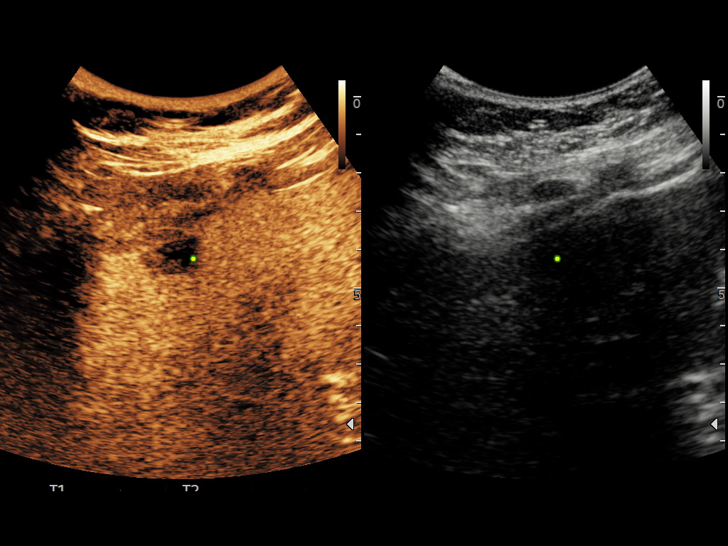
[im 11/23]
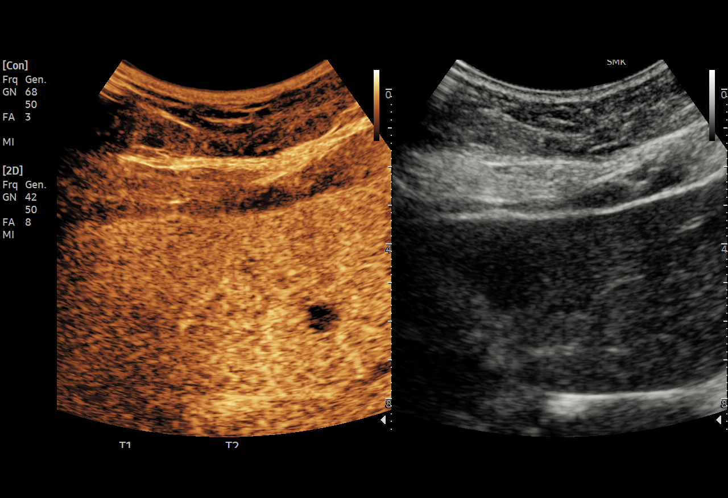
[im 13/23]
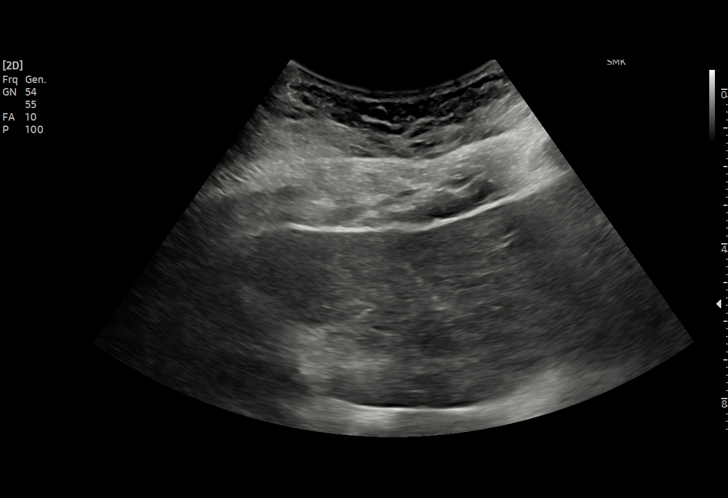
[im 14/23]
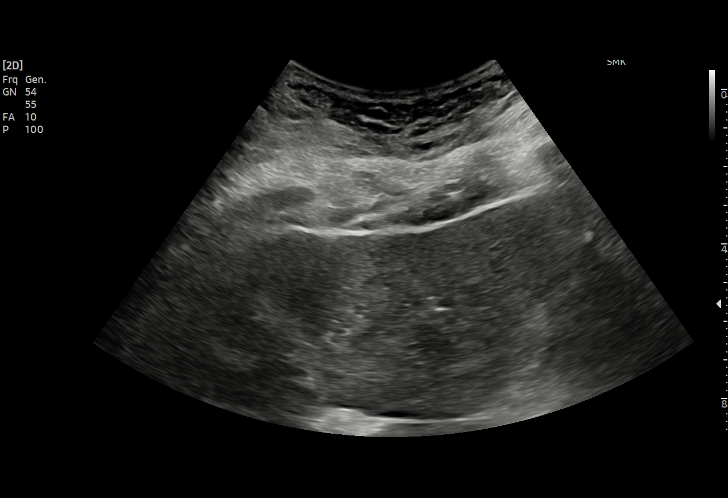
[im 16/23]
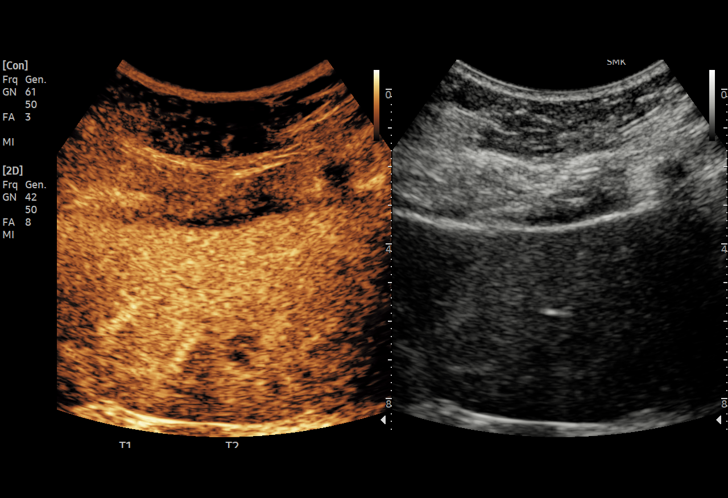
[im 18/23]
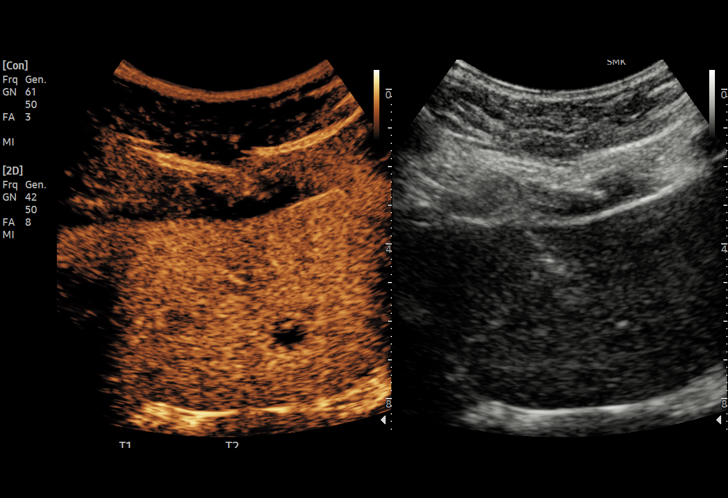
[im 19/23]
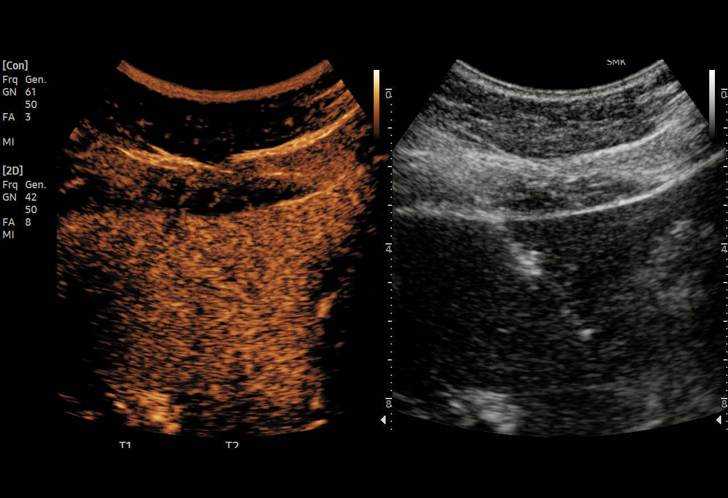
[im 21/23]
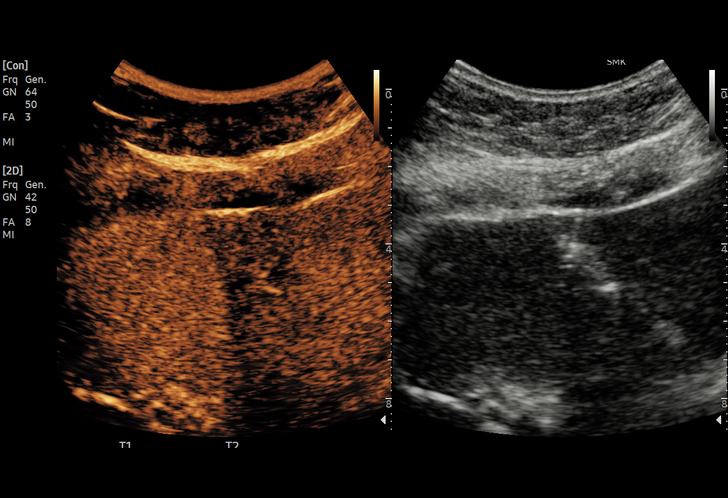
[im 23/23]
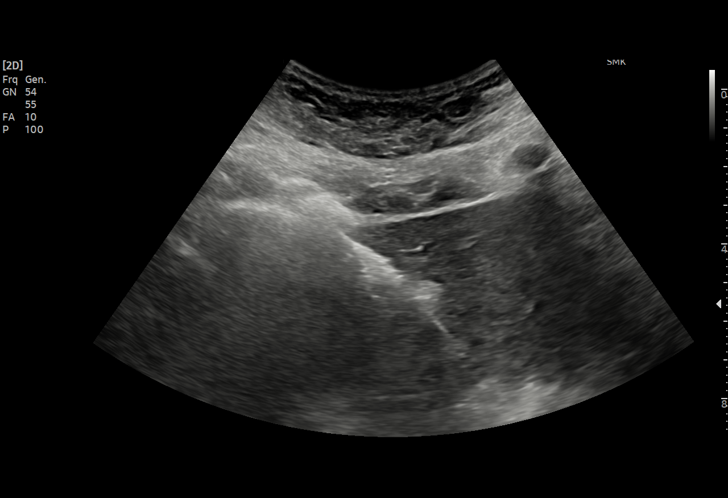

[14 of 23 positions shown; findings below may reference images not displayed]

MEDICATIONS:
5.0 mL Lumason (sulfa hexafluoride), intravenous, 2 injections

ANESTHESIA/SEDATION:
Fentanyl 75 mcg IV; Versed 3 mg IV

Total Moderate Sedation time:  45 minutes.

The patient's level of consciousness and vital signs were monitored
continuously by radiology nursing throughout the procedure under my
direct supervision.

COMPLICATIONS:
None immediate.

PROCEDURE:
Informed written consent was obtained from the patient after a
discussion of the risks, benefits and alternatives to treatment. The
patient understands and consents the procedure. A timeout was
performed prior to the initiation of the procedure.

Ultrasound scanning was performed of the right upper abdominal
quadrant demonstrates:

LIVER

Size: Normal.

Echogenicity: Within normal limits.

Surface Contour: Smooth.

Observation 1

Lobe: Left

Size: 1.8 x 1.7 x 1.3 cm

Arterial phase enhancement features: Rim

Onset of washout: Approximately 30 seconds

Degree of washout: Marked

Tumor in vein: No

PHANVAN category: Not applicable.

The mass in the left lobe liver was selected for biopsy and the
procedure was planned. The right upper abdominal quadrant was
prepped and draped in the usual sterile fashion. The overlying soft
tissues were anesthetized with 1% lidocaine with epinephrine. A 17
gauge, 6.8 cm co-axial needle was unfortunately unable to be
advanced into a peripheral aspect of the lesion due to overlying rib
and cartilage. However, an additional smaller, subcentimeter lesion
was visualized deeper in the left lobe during parenchymal
enhancement phase. Therefore, this lesion was targeted and the
coaxial needle was advanced into the peripheral aspect of the
lesion. This was followed by a total of 5 core biopsies with an 18
gauge core device under direct ultrasound guidance.

The coaxial needle tract was embolized with a small amount of
Gel-Foam slurry and superficial hemostasis was obtained with manual
compression. Post procedural scanning was negative for definitive
area of hemorrhage or additional complication. A dressing was
placed. The patient tolerated the procedure well without immediate
post procedural complication.
IMPRESSION: Technically successful ultrasound guided core needle biopsy of left
lobe liver lesion with contrast enhanced ultrasound characteristics
of metastasis.

## 2021-09-04 MED ORDER — MIDAZOLAM HCL 2 MG/2ML IJ SOLN
INTRAMUSCULAR | Status: AC
  Filled 2021-09-04: qty 4

## 2021-09-04 MED ORDER — SULFUR HEXAFLUORIDE MICROSPH 60.7-25 MG IJ SUSR
5.0000 mL | Freq: Once | INTRAMUSCULAR | Status: AC | PRN
  Administered 2021-09-04: 5 mL via INTRAVENOUS

## 2021-09-04 MED ORDER — FENTANYL CITRATE (PF) 100 MCG/2ML IJ SOLN
INTRAMUSCULAR | Status: AC
  Filled 2021-09-04: qty 2

## 2021-09-04 MED ORDER — SULFUR HEXAFLUORIDE MICROSPH 60.7-25 MG IJ SUSR: 5.0000 mL | Freq: Once | INTRAMUSCULAR | Status: DC | PRN

## 2021-09-04 MED ORDER — SODIUM CHLORIDE 0.9 % IV SOLN: INTRAVENOUS | Status: DC

## 2021-09-04 MED ORDER — MIDAZOLAM HCL 2 MG/2ML IJ SOLN
INTRAMUSCULAR | Status: AC | PRN
  Administered 2021-09-04 (×3): 1 mg via INTRAVENOUS

## 2021-09-04 MED ORDER — SULFUR HEXAFLUORIDE MICROSPH 60.7-25 MG IJ SUSR
INTRAMUSCULAR | Status: AC | PRN
  Administered 2021-09-04 (×3): 2.4 mL via INTRAVENOUS

## 2021-09-04 MED ORDER — FENTANYL CITRATE (PF) 100 MCG/2ML IJ SOLN
INTRAMUSCULAR | Status: AC | PRN
  Administered 2021-09-04 (×4): 25 ug via INTRAVENOUS

## 2021-09-04 NOTE — Sedation Documentation (Signed)
[place] machine changed out to [facility] unit for better visualization and use of Lumason injection.

## 2021-09-04 NOTE — Consult Note (Signed)
Chief Complaint: Patient was seen in consultation today for liver lesion biopsy  Referring Physician(s): Rodenberg,Denny  History of Present Illness: Chelsea [name] is a 75 y.o. female with history of recently discovered indeterminate liver lesions as well as cecal and pelvic masses.  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    She endorses approximately 1 month of constipation.  No fevers, chills, shortness of breath, chest pain, abdominal pain. Recovering from recent spinal surgery.    Past Medical History:  Diagnosis Date   Anxiety    Arthritis    Depression    Hyperlipidemia    Hypertension    Seizures ([place])    Spinal stenosis    Ulcer    Urinary incontinence     Past Surgical History:  Procedure Laterality Date   ABDOMINAL HYSTERECTOMY     BACK SURGERY     due to polio   BREAST BIOPSY Bilateral    neg   BREAST BIOPSY Right 2011   neg/stereo   CARPAL TUNNEL RELEASE     CATARACT EXTRACTION Right 2020   COLONOSCOPY WITH PROPOFOL N/A 04/04/2021   Procedure: COLONOSCOPY WITH PROPOFOL;  Surgeon: Virgel Manifold, MD;  Location: [facility] ENDOSCOPY;  Service: Endoscopy;  Laterality: N/A;   EYE SURGERY      Allergies: Penicillins  Medications: Prior to Admission medications   Medication Sig Start Date End Date Taking? Authorizing Provider  acetaminophen (TYLENOL) 500 MG tablet Take 500 mg by mouth every 6 (six) hours as needed.   Yes [provider]  ALPRAZolam Duanne Moron) 0.5 MG tablet TAKE 1 TABLET BY MOUTH TWICE A DAY AS NEEDED FOR ANXIETY 09/02/21  Yes Baity, Coralie Keens, NP  busPIRone (BUSPAR) 5 MG tablet Take 1 tablet (5 mg total) by mouth 3 (three) times daily. 04/11/21  Yes Baity, Coralie Keens, NP  Cyanocobalamin (VITAMIN B 12 PO) Take 500 mcg by mouth daily.   Yes [provider]  diclofenac Sodium (VOLTAREN) 1 % GEL  Apply 4 g topically 4 (four) times daily. 12/17/20  Yes Kathrine Haddock, NP  FLUoxetine (PROZAC) 20 MG capsule TAKE 3 CAPSULES (60 MG TOTAL) BY MOUTH EVERY MORNING. 08/30/21  Yes Baity, Coralie Keens, NP  gabapentin (NEURONTIN) 100 MG capsule TAKE 2 CAPSULES BY MOUTH 2 TIMES DAILY. 07/02/21  Yes Karamalegos, Alexander J, DO  lidocaine (LIDODERM) 5 % Place 1 patch onto the skin daily. Remove & Discard patch within 12 hours or as directed by MD 10/24/20  Yes Max Sane, MD  nitrofurantoin, macrocrystal-monohydrate, (MACROBID) 100 MG capsule Take 1 capsule (100 mg total) by mouth every 12 (twelve) hours. 09/02/21  Yes MacDiarmid, Nicki Reaper, MD  polyethylene glycol powder (GLYCOLAX/MIRALAX) 17 GM/SCOOP powder Take 17 g by mouth daily. 01/28/21  Yes Jearld Fenton, NP  vitamin E 180 MG (400 UNITS) capsule Take 400 Units by mouth daily.   Yes [provider]  aspirin 325 MG tablet Take 325 mg by mouth daily.    [provider]  meclizine (ANTIVERT) 12.5 MG tablet Take 1 tablet (12.5 mg total) by mouth 3 (three) times daily as needed for dizziness. Patient not taking: Reported on 08/25/2021 07/08/21   Jearld Fenton, NP  methocarbamol (ROBAXIN) 500 MG tablet Take 500 mg by mouth every 6 (six) hours as needed for muscle spasms. 1-2 tabs every 6 hours PRN. Patient not taking: Reported on 08/25/2021    [provider]  metroNIDAZOLE (FLAGYL) 500 MG tablet Take 2 tablets at 8 AM, take 2 tablets at 2 PM, and take 2 tablets at 8 PM the day prior to your surgery. Patient not taking: Reported on 09/04/2021 08/19/21   Ronny Bacon, MD  neomycin (MYCIFRADIN) 500 MG tablet Take 2 tablets (1,000 mg total) by mouth 3 (three) times daily. Patient not taking: Reported on 09/04/2021 08/19/21   Ronny Bacon, MD  triamterene-hydrochlorothiazide (MAXZIDE-25) 37.5-25 MG tablet Take 1 tablet by mouth daily. Patient not taking: Reported on 09/04/2021 07/27/21   [provider]     Family History   Problem Relation Age of Onset   Arthritis Mother    Heart disease Mother    Stroke Mother    Hypertension Mother    COPD Mother    Sudden death Sister    Other Father        unknown medical history   Breast cancer Neg Hx     Social History   Socioeconomic History   Marital status: Divorced    Spouse name: Not on file   Number of children: Not on file   Years of education: Not on file   Highest education level: Not on file  Occupational History   Not on file  Tobacco Use   Smoking status: Never   Smokeless tobacco: Never  Vaping Use   Vaping Use: Never used  Substance and Sexual Activity   Alcohol use: Never   Drug use: No   Sexual activity: Not on file  Other Topics Concern   Not on file  Social History Narrative   Not on file   Social Determinants of Health   Financial Resource Strain: Not on file  Food Insecurity: Not on file  Transportation Needs: Not on file  Physical Activity: Not on file  Stress: Not on file  Social Connections: Not on file    Review of Systems: A 12 point ROS discussed and pertinent positives are indicated in the HPI above.  All other systems are negative.  Vital Signs: BP (!) 165/86   Pulse 75   Temp 98.1 F (36.7 C) (Oral)   Resp 18   Ht 4\' 9"  (1.448 m)   Wt 78.5 kg   SpO2 99%   BMI 37.44 kg/m   Physical Exam Constitutional:      General: She is not in acute distress. HENT:     Head: Normocephalic.     Mouth/Throat:     Mouth: Mucous membranes are moist.     Comments: MP3  Cardiovascular:     Rate and Rhythm: Normal rate and regular rhythm.     Heart sounds: Normal heart sounds.  Pulmonary:     Effort: No respiratory distress.     Breath sounds: Normal breath sounds.  Abdominal:     General: There is no distension.  Skin:    General: Skin is warm and dry.  Neurological:     Mental Status: She is alert and oriented to person, place, and time.    Imaging: CT AP 08/21/21   Labs:  CBC: Recent Labs     02/12/21 0209 03/23/21 1916 06/17/21 1008 09/04/21 0759  WBC 5.1 4.8 4.1 4.4  HGB 10.9* 11.7* 11.1* 11.6*  HCT 35.3* 36.6 34.3* 37.5  PLT 222 237 212 207    COAGS: Recent Labs    09/04/21 0759  INR 1.0    BMP: Recent Labs    02/03/21 1458 02/12/21 0209 03/23/21 1916 06/17/21 1008 08/19/21  1526  NA 140 139 137 138  --   K 3.5 3.9 3.8 3.3*  --   CL 102 99 101 101  --   CO2 30 33* 28 27  --   GLUCOSE 102* 104* 103* 97  --   BUN 14 19 19 14 14   CALCIUM 9.4 9.6 9.5 9.1  --   CREATININE 0.74 0.93 0.99 0.77 0.91  GFRNONAA >60 >60 59* >60 >60    LIVER FUNCTION TESTS: Recent Labs    10/24/20 0402 10/28/20 1409 02/12/21 0209 03/23/21 1916  BILITOT 0.7 0.8 0.5 0.7  AST 32 27 21 15   ALT 29 29 17 12   ALKPHOS 51 70 80 69  PROT 5.9* 6.7 7.5 7.9  ALBUMIN 2.8* 3.3* 3.7 3.8    TUMOR MARKERS: No results for input(s): AFPTM, CEA, CA199, CHROMGRNA in the last 8760 hours.  Assessment and Plan: 75 year old female with history of indeterminate liver lesions in the setting of cecal and pelvic masses.    Plan to proceed with ultrasound guided liver mass biopsy with moderate sedation.  Risks and benefits of liver biopsy was discussed with the patient and/or patient's family including, but not limited to bleeding, infection, damage to adjacent structures or low yield requiring additional tests.  All of the questions were answered and there is agreement to proceed.  Consent signed and in chart.    Electronically Signed: Suzette Battiest, MD 09/04/2021, 8:19 AM   I spent a total of  15 Minutes  in face to face in clinical consultation, greater than 50% of which was counseling/coordinating care for liver biopsy.

## 2021-09-04 NOTE — Sedation Documentation (Signed)
Gelfoam introduced to liver lesion by MD; pt. Tolerated well.

## 2021-09-04 NOTE — Procedures (Signed)
Interventional Radiology Procedure Note  Procedure:  1) Contrast enhanced ultrasound 2) Ultrasound guided liver mass biopsy  Findings: Please refer to procedural dictation for full description.  Left lobe liver lesion 18 ga core x 5.  Difficult, small lesion to target, several samples will be normal liver parenchyma.  Gelfoam slurry needle track embolization.  Complications: None immediate.  Estimated Blood Loss: 5 mL  Recommendations: Strict 3 hour bedrest. Follow up Pathology results.   Ruthann Cancer, MD Pager: [phone]

## 2021-09-05 ENCOUNTER — Telehealth: Payer: Self-pay | Admitting: Student

## 2021-09-05 ENCOUNTER — Other Ambulatory Visit: Payer: Medicare Other | Admitting: Student

## 2021-09-05 DIAGNOSIS — K6389 Other specified diseases of intestine: Secondary | ICD-10-CM | POA: Diagnosis not present

## 2021-09-05 DIAGNOSIS — K5901 Slow transit constipation: Secondary | ICD-10-CM | POA: Diagnosis not present

## 2021-09-05 DIAGNOSIS — K769 Liver disease, unspecified: Secondary | ICD-10-CM | POA: Diagnosis not present

## 2021-09-05 DIAGNOSIS — Z515 Encounter for palliative care: Secondary | ICD-10-CM

## 2021-09-05 NOTE — Progress Notes (Signed)
[facility] Pall[facility] Consult Note Telephone: [phone]  Fax: [phone]    Date of encounter: 09/05/21  PATIENT NAME: Chelsea [name] [address] [place] [address]   [phone] (home)  DOB: 02-Feb-1946 MRN: [id number] PRIMARY CARE PROVIDER:    Jearld Fenton, NP,  [place] [address] [phone]  REFERRING PROVIDER:   Jearld Fenton, NP [address] [place],  [place] [address] [phone]  RESPONSIBLE PARTY:    Contact Information     Name Relation Home Work Mobile   Radford Pax Roth,Denise Daughter [phone]  [phone]   ROTH,[name] Relative   [phone]       Due to the COVID-19 crisis, this visit was done via telemedicine from my office and it was initiated and consent by this patient.   This home visit was completed via telephone due to the patient's inability to connect via an audiovisual connection or their refusal to have an in-person visit. This connection was agreed to by the patient. I verified that I am speaking with the correct person using two identifiers.                                    ASSESSMENT AND PLAN / RECOMMENDATIONS:   Advance Care Planning/Goals of Care: Goals include to maximize quality of life and symptom management. Our advance care planning conversation included a discussion about:    The value and importance of advance care planning  Experiences with loved ones who have been seriously ill or have died  Exploration of personal, cultural or spiritual beliefs that might influence medical decisions  Exploration of goals of care in the event of a sudden injury or illness  CODE STATUS: Full Code  Symptom Management/Plan:  Colon mass, cecum; liver lesions-patient had liver biopsy yesterday. She is awaiting results. She is to follow up with GI as scheduled.   Constipation-restart miralax 17 gm daily. Diet with fiber recommended.   Generalized weakness-continue physical  therapy as directed.   Follow up Palliative Care Visit: Palliative care will continue to follow for complex medical decision making, advance care planning, and clarification of goals. Return in 2-4 weeks or prn.  I spent 25 minutes providing this consultation. More than 50% of the time in this consultation was spent in counseling and care coordination.   PPS: 50%  HOSPICE ELIGIBILITY/DIAGNOSIS: TBD  Chief Complaint: Palliative Medicine follow up visit.   HISTORY OF PRESENT ILLNESS:  Chelsea [name] is a 75 y.o. year old female  with OA, anxiety, depression, seizures, spinal stenosis, cervical myelopathy s/p cervical spinal fusion, urinary incontinence.   Patient resides at [facility]. Patient had a liver biopsy  yesterday; she is awaiting results. Abdominal, pelvic CT scan on 08/21/21 impression: soft tissue mass at the base of cecum, multiple low density lesions throughout liver, suggestive of metastatic disease, complex mass in the region of vaginal cuff.  In July, patient had a colonoscopy w/biopsy in July which showed colon mass, cecum. Patient was also found to have low attenuation mass in region the vagina cuff. She will follow up to see if she will need to have surgery. Patient endorses mild pain/soreness; she is taking acetaminophen PRN. She is to have 24/7 caregivers for a couple of days. She is receiving PT. She uses walker for ambulation; endorses weakness. She denies shortness of breath, nausea. She endorses  constipation. She has not been taking any laxatives. She is currently being treated for UTI; she states incontinence, leakage had started to improve some up until this recent UTI. Her myrbetriq had been stopped recently. She states her depression and anxiety have been managed. She has upcoming appointments with cardiology, gastroenterology and her PCP.    History obtained from review of EMR, discussion with primary team, and interview with family, facility staff/caregiver  and/or Ms. Brickhouse.  I reviewed available labs, medications, imaging, studies and related documents from the EMR.  Records reviewed and summarized above.   ROS  General: NAD EYES: denies vision changes ENMT: denies dysphagia Cardiovascular: denies chest pain, denies DOE Pulmonary: denies cough, denies increased SOB Abdomen: endorses good appetite, +constipation, endorses continence of bowel GU: dysuria, endorses incontinence of urine MSK:  +weakness,  no falls reported Skin: denies rashes or wounds Neurological: denies pain, denies insomnia Psych: Endorses stable mood Heme/lymph/immuno: denies bruises, abnormal bleeding  Physical Exam:  PE deferred due to Tele health visit.    Thank you for the opportunity to participate in the care of Ms. Bowley.  The palliative care team will continue to follow. Please call our office at [phone] if we can be of additional assistance.   Ezekiel Slocumb, NP   COVID-19 PATIENT SCREENING TOOL Asked and negative response unless otherwise noted:   Have you had symptoms of covid, tested positive or been in contact with someone with symptoms/positive test in the past 5-10 days? No

## 2021-09-05 NOTE — Telephone Encounter (Signed)
Palliative NP left message to follow up; awaiting return call.

## 2021-09-09 DIAGNOSIS — Z7689 Persons encountering health services in other specified circumstances: Secondary | ICD-10-CM | POA: Diagnosis not present

## 2021-09-09 DIAGNOSIS — I340 Nonrheumatic mitral (valve) insufficiency: Secondary | ICD-10-CM | POA: Insufficient documentation

## 2021-09-09 DIAGNOSIS — R42 Dizziness and giddiness: Secondary | ICD-10-CM | POA: Insufficient documentation

## 2021-09-09 DIAGNOSIS — R0602 Shortness of breath: Secondary | ICD-10-CM | POA: Insufficient documentation

## 2021-09-09 DIAGNOSIS — E7800 Pure hypercholesterolemia, unspecified: Secondary | ICD-10-CM | POA: Diagnosis not present

## 2021-09-09 DIAGNOSIS — I10 Essential (primary) hypertension: Secondary | ICD-10-CM | POA: Diagnosis not present

## 2021-09-11 ENCOUNTER — Ambulatory Visit (INDEPENDENT_AMBULATORY_CARE_PROVIDER_SITE_OTHER): Payer: Medicare Other | Admitting: Internal Medicine

## 2021-09-11 ENCOUNTER — Encounter: Payer: Self-pay | Admitting: Internal Medicine

## 2021-09-11 ENCOUNTER — Other Ambulatory Visit: Payer: Self-pay

## 2021-09-11 ENCOUNTER — Encounter: Payer: Self-pay | Admitting: Gastroenterology

## 2021-09-11 VITALS — BP 137/54 | HR 80 | Temp 97.1°F | Resp 17 | Ht <= 58 in | Wt 178.0 lb

## 2021-09-11 DIAGNOSIS — C189 Malignant neoplasm of colon, unspecified: Secondary | ICD-10-CM

## 2021-09-11 NOTE — Progress Notes (Signed)
Subjective:    Patient ID: Chelsea [name], female    DOB: 04-16-1946, 75 y.o.   MRN: [id number]  HPI  Pt presents to the clinic today for follow up. She wants to update me on what has been going on since her last follow up.  03/25/21- pelvic/vaginal cuff mass seen on CT scan  6/22- seen by oncology, referred to GI for colonoscopy.  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  11/3- seen by GI, referred to general surgery for consultation  11/15- seen by general surgery, ordered repeat CT abd/pelvis- showed liver lesions concerning for metastasis  12/1- liver biopy- showed metastatic adenocarcinoma  12/12- seen by oncology, CT chest ordered, patient unsure what she wanted to do for treatment, she is being followed by palliative care.    Review of Systems     Past Medical History:  Diagnosis Date   Anxiety    Arthritis    Depression    Hyperlipidemia    Hypertension    Seizures ([place])    Spinal stenosis    Ulcer    Urinary incontinence     Current Outpatient Medications  Medication Sig Dispense Refill   acetaminophen (TYLENOL) 500 MG tablet Take 500 mg by mouth every 6 (six) hours as needed.     ALPRAZolam (XANAX) 0.5 MG tablet TAKE 1 TABLET BY MOUTH TWICE A DAY AS NEEDED FOR ANXIETY 60 tablet 0   aspirin 325 MG tablet Take 325 mg by mouth daily.     busPIRone (BUSPAR) 5 MG tablet Take 1 tablet (5 mg total) by mouth 3 (three) times daily. 270 tablet 1   Cyanocobalamin (VITAMIN B 12 PO) Take 500 mcg by mouth daily.     diclofenac Sodium (VOLTAREN) 1 % GEL Apply 4 g topically 4 (four) times daily. 4 g 0   FLUoxetine (PROZAC) 20 MG capsule TAKE 3 CAPSULES (60 MG TOTAL) BY MOUTH EVERY MORNING. 270 capsule 0   gabapentin (NEURONTIN) 100 MG capsule TAKE 2 CAPSULES BY MOUTH 2 TIMES DAILY. 360 capsule 1   lidocaine (LIDODERM) 5 % Place 1 patch onto the skin daily. Remove & Discard patch  within 12 hours or as directed by MD 1 patch 0   meclizine (ANTIVERT) 12.5 MG tablet Take 1 tablet (12.5 mg total) by mouth 3 (three) times daily as needed for dizziness. (Patient not taking: Reported on 08/25/2021) 30 tablet 0   methocarbamol (ROBAXIN) 500 MG tablet Take 500 mg by mouth every 6 (six) hours as needed for muscle spasms. 1-2 tabs every 6 hours PRN. (Patient not taking: Reported on 08/25/2021)     metroNIDAZOLE (FLAGYL) 500 MG tablet Take 2 tablets at 8 AM, take 2 tablets at 2 PM, and take 2 tablets at 8 PM the day prior to your surgery. (Patient not taking: Reported on 09/04/2021) 6 tablet 0   neomycin (MYCIFRADIN) 500 MG tablet Take 2 tablets (1,000 mg total) by mouth 3 (three) times daily. (Patient not taking: Reported on 09/04/2021) 6 tablet 0   nitrofurantoin, macrocrystal-monohydrate, (MACROBID) 100 MG capsule Take 1 capsule (100 mg total) by mouth every 12 (twelve) hours. 14 capsule 0   polyethylene glycol powder (GLYCOLAX/MIRALAX) 17 GM/SCOOP powder Take 17 g by mouth daily. 3350 g 1   triamterene-hydrochlorothiazide (MAXZIDE-25) 37.5-25 MG tablet Take 1 tablet by mouth daily. (Patient not taking: Reported on 09/04/2021)     vitamin E 180 MG (400 UNITS) capsule Take 400 Units by  mouth daily.     No current facility-administered medications for this visit.    Allergies  Allergen Reactions   Penicillins     "Everything turned black"    Family History  Problem Relation Age of Onset   Arthritis Mother    Heart disease Mother    Stroke Mother    Hypertension Mother    COPD Mother    Sudden death Sister    Other Father        unknown medical history   Breast cancer Neg Hx     Social History   Socioeconomic History   Marital status: Divorced    Spouse name: Not on file   Number of children: Not on file   Years of education: Not on file   Highest education level: Not on file  Occupational History   Not on file  Tobacco Use   Smoking status: Never   Smokeless  tobacco: Never  Vaping Use   Vaping Use: Never used  Substance and Sexual Activity   Alcohol use: Never   Drug use: No   Sexual activity: Not on file  Other Topics Concern   Not on file  Social History Narrative   Not on file   Social Determinants of Health   Financial Resource Strain: Not on file  Food Insecurity: Not on file  Transportation Needs: Not on file  Physical Activity: Not on file  Stress: Not on file  Social Connections: Not on file  Intimate Partner Violence: Not on file     Constitutional: Pt reports intermittent headaches. Denies fever, malaise, fatigue, or abrupt weight changes.  Respiratory: Denies difficulty breathing, shortness of breath, cough or sputum production.   Cardiovascular: Denies chest pain, chest tightness, palpitations or swelling in the hands or feet.  Gastrointestinal: Denies abdominal pain, bloating, constipation, diarrhea or blood in the stool.  GU: Denies urgency, frequency, pain with urination, burning sensation, blood in urine, odor or discharge. Musculoskeletal: Denies decrease in range of motion, difficulty with gait, muscle pain or joint pain and swelling.  Skin: Denies redness, rashes, lesions or ulcercations.  Neurological: Pt reports intermittent dizziness, difficulty with balance. Denies difficulty with memory, difficulty with speech or problems with coordination.    No other specific complaints in a complete review of systems (except as listed in HPI above).  Objective:   Physical Exam BP (!) 137/54 (BP Location: Right Arm, Patient Position: Sitting, Cuff Size: Normal)   Pulse 80   Temp (!) 97.1 F (36.2 C) (Temporal)   Resp 17   Ht 4\' 9"  (1.448 m)   Wt 178 lb (80.7 kg)   SpO2 99%   BMI 38.52 kg/m   Wt Readings from Last 3 Encounters:  09/04/21 173 lb (78.5 kg)  08/25/21 180 lb (81.6 kg)  08/19/21 178 lb (80.7 kg)    General: Appears herr stated age, obese, in NAD. Cardiovascular: Normal rate and rhythm. S1,S2  noted.  No murmur, rubs or gallops noted. Pulmonary/Chest: Normal effort and positive vesicular breath sounds. No respiratory distress. No wheezes, rales or ronchi noted.  Abdomen: Soft and nontender. Normal bowel sounds. No distention or masses noted. Musculoskeletal: Gait slow and steady with use of walker. Neurological: Alert and oriented. She has difficulty with recall. Disorganized thinking.   BMET    Component Value Date/Time   NA 138 06/17/2021 1008   K 3.3 (L) 06/17/2021 1008   CL 101 06/17/2021 1008   CO2 27 06/17/2021 1008   GLUCOSE 97 06/17/2021 1008  BUN 14 08/19/2021 1526   CREATININE 0.91 08/19/2021 1526   CREATININE 0.87 08/13/2020 1129   CALCIUM 9.1 06/17/2021 1008   GFRNONAA >60 08/19/2021 1526   GFRNONAA 65 08/13/2020 1129   GFRAA 76 08/13/2020 1129    Lipid Panel     Component Value Date/Time   CHOL 238 (H) 02/26/2020 1130   TRIG 84 02/26/2020 1130   HDL 60 02/26/2020 1130   CHOLHDL 4.0 02/26/2020 1130   VLDL 18.4 02/08/2013 1638   LDLCALC 159 (H) 02/26/2020 1130    CBC    Component Value Date/Time   WBC 4.4 09/04/2021 0759   RBC 4.02 09/04/2021 0759   HGB 11.6 (L) 09/04/2021 0759   HCT 37.5 09/04/2021 0759   PLT 207 09/04/2021 0759   MCV 93.3 09/04/2021 0759   MCH 28.9 09/04/2021 0759   MCHC 30.9 09/04/2021 0759   RDW 14.3 09/04/2021 0759   LYMPHSABS 2.5 02/12/2021 0209   MONOABS 0.6 02/12/2021 0209   EOSABS 0.2 02/12/2021 0209   BASOSABS 0.0 02/12/2021 0209    Hgb A1C No results found for: HGBA1C         Assessment & Plan:   Metastatic Colon Cancer:  Discussed with patient her CT findings She is not a candidate for surgical intervention at this time She has a follow up appt with GI on 12/20 She is being followed by GYN, oncology and palliative care CT chest at this time still pending Questions answered, I called her daughter and discussed with her as well  Return precautions discussed  Webb Silversmith, NP This visit  occurred during the SARS-CoV-2 public health emergency.  Safety protocols were in place, including screening questions prior to the visit, additional usage of staff PPE, and extensive cleaning of exam room while observing appropriate contact time as indicated for disinfecting solutions.

## 2021-09-12 ENCOUNTER — Other Ambulatory Visit: Payer: Self-pay

## 2021-09-12 DIAGNOSIS — Z4789 Encounter for other orthopedic aftercare: Secondary | ICD-10-CM | POA: Diagnosis not present

## 2021-09-12 DIAGNOSIS — C189 Malignant neoplasm of colon, unspecified: Secondary | ICD-10-CM

## 2021-09-12 DIAGNOSIS — I10 Essential (primary) hypertension: Secondary | ICD-10-CM | POA: Diagnosis not present

## 2021-09-12 DIAGNOSIS — F329 Major depressive disorder, single episode, unspecified: Secondary | ICD-10-CM | POA: Diagnosis not present

## 2021-09-12 DIAGNOSIS — E785 Hyperlipidemia, unspecified: Secondary | ICD-10-CM | POA: Diagnosis not present

## 2021-09-12 DIAGNOSIS — K219 Gastro-esophageal reflux disease without esophagitis: Secondary | ICD-10-CM | POA: Diagnosis not present

## 2021-09-12 DIAGNOSIS — M5000 Cervical disc disorder with myelopathy, unspecified cervical region: Secondary | ICD-10-CM | POA: Diagnosis not present

## 2021-09-12 DIAGNOSIS — M1990 Unspecified osteoarthritis, unspecified site: Secondary | ICD-10-CM | POA: Diagnosis not present

## 2021-09-15 ENCOUNTER — Other Ambulatory Visit: Payer: Self-pay

## 2021-09-15 ENCOUNTER — Inpatient Hospital Stay: Payer: Medicare Other | Attending: Oncology | Admitting: Oncology

## 2021-09-15 ENCOUNTER — Encounter: Payer: Self-pay | Admitting: Oncology

## 2021-09-15 ENCOUNTER — Ambulatory Visit: Payer: Medicare Other | Admitting: Urology

## 2021-09-15 ENCOUNTER — Inpatient Hospital Stay: Payer: Medicare Other

## 2021-09-15 VITALS — BP 144/83 | HR 74 | Temp 97.9°F | Wt 171.3 lb

## 2021-09-15 DIAGNOSIS — Z7189 Other specified counseling: Secondary | ICD-10-CM

## 2021-09-15 DIAGNOSIS — R1900 Intra-abdominal and pelvic swelling, mass and lump, unspecified site: Secondary | ICD-10-CM | POA: Diagnosis not present

## 2021-09-15 DIAGNOSIS — F32A Depression, unspecified: Secondary | ICD-10-CM | POA: Diagnosis not present

## 2021-09-15 DIAGNOSIS — C787 Secondary malignant neoplasm of liver and intrahepatic bile duct: Secondary | ICD-10-CM | POA: Insufficient documentation

## 2021-09-15 DIAGNOSIS — C189 Malignant neoplasm of colon, unspecified: Secondary | ICD-10-CM

## 2021-09-15 DIAGNOSIS — Z79899 Other long term (current) drug therapy: Secondary | ICD-10-CM | POA: Diagnosis not present

## 2021-09-15 LAB — COMPREHENSIVE METABOLIC PANEL
ALT: 20 U/L (ref 0–44)
AST: 25 U/L (ref 15–41)
Albumin: 3.7 g/dL (ref 3.5–5.0)
Alkaline Phosphatase: 84 U/L (ref 38–126)
Anion gap: 7 (ref 5–15)
BUN: 14 mg/dL (ref 8–23)
CO2: 28 mmol/L (ref 22–32)
Calcium: 8.7 mg/dL — ABNORMAL LOW (ref 8.9–10.3)
Chloride: 104 mmol/L (ref 98–111)
Creatinine, Ser: 0.84 mg/dL (ref 0.44–1.00)
GFR, Estimated: >60 mL/min (ref >60)
Glucose, Bld: 89 mg/dL (ref 70–99)
Potassium: 3.5 mmol/L (ref 3.5–5.1)
Sodium: 139 mmol/L (ref 135–145)
Total Bilirubin: 0.7 mg/dL (ref 0.3–1.2)
Total Protein: 7.5 g/dL (ref 6.5–8.1)

## 2021-09-15 LAB — CBC WITH DIFFERENTIAL/PLATELET
Abs Immature Granulocytes: 0.02 10*3/uL (ref 0.00–0.07)
Basophils Absolute: 0 10*3/uL (ref 0.0–0.1)
Basophils Relative: 0 %
Eosinophils Absolute: 0.2 10*3/uL (ref 0.0–0.5)
Eosinophils Relative: 3 %
HCT: 36 % (ref 36.0–46.0)
Hemoglobin: 11.1 g/dL — ABNORMAL LOW (ref 12.0–15.0)
Immature Granulocytes: 0 %
Lymphocytes Relative: 43 %
Lymphs Abs: 2.2 10*3/uL (ref 0.7–4.0)
MCH: 29.4 pg (ref 26.0–34.0)
MCHC: 30.8 g/dL (ref 30.0–36.0)
MCV: 95.5 fL (ref 80.0–100.0)
Monocytes Absolute: 0.7 10*3/uL (ref 0.1–1.0)
Monocytes Relative: 12 %
Neutro Abs: 2.3 10*3/uL (ref 1.7–7.7)
Neutrophils Relative %: 42 %
Platelets: 238 10*3/uL (ref 150–400)
RBC: 3.77 MIL/uL — ABNORMAL LOW (ref 3.87–5.11)
RDW: 14.9 % (ref 11.5–15.5)
WBC: 5.3 10*3/uL (ref 4.0–10.5)
nRBC: 0 % (ref 0.0–0.2)

## 2021-09-15 NOTE — Progress Notes (Signed)
Hematology/Oncology Consult note [phone] [phone]      Patient Care Team: Jearld Fenton, NP as PCP - General (Internal Medicine) Clent Jacks, RN as Oncology Nurse Navigator  REFERRING PROVIDER: Virgel Manifold, MD  CHIEF COMPLAINTS/REASON FOR VISIT:  Evaluation of metastatic colon cancer  HISTORY OF PRESENTING ILLNESS:   Chelsea [name] is a  74 y.o.  female with PMH listed below was seen in consultation at the request of  Virgel Manifold, MD  for evaluation of metastatic colon cancer.  Patient initially went to emergency room on 03/24/2021 for abdominal pain. 03/24/2021, CT scan of the abdomen showed marked bladder wall thickening with surrounding soft tissue stranding is noted.  Concerning for cystitis.  There is a lobulated mass between the posterior wall of the bladder and the rectum which appears to arise from the vaginal cuff.  This is indeterminate and difficult to characterize reflecting lack of IV contrast material.  Nonobstructing left renal calculus, left sacroiliitis, lumbar spondylosis aortic atherosclerosis. 03/24/2021 CT pelvic showed abdominal [place] soft tissue of right cecum, concerning for colon carcinoma.  Colonoscopy recommended.  Low-attenuation mass in the region of vaginal cuff is again noted.  Diffuse urinary bladder wall thickening and mild bilateral hydroureter possibly cystitis.  [place] 03/24/2021 Lobulated solid 3.8 cm mass confirmed by ultrasound. This is most compatible with a neoplasm but remains indeterminate as to the origin as the epicenter seems to be outside of both the vaginal cuff and the adjacent colon, while the lesion appears inseparable from both. Extensive echogenic debris within the distended urinary bladder.   03/26/2021, patient was seen by Dr. Theora Gianotti for evaluation of colonic/vaginal cuff pelvic mass.  Patient also was referred to gastroenterology for colonoscopy.  03/26/2021 CEA 3.4.  CA125  9.7 04/04/2021, status post colonoscopy which showed a frond like /villous nonobstructing large mass was found in the cecum, this was biopsied.  Terminal ileum was briefly intubated and appeared normal.  Diverticulosis in the sigmoid colon.  The rectum, sigmoid colon, descending colon, transverse colon and ascending colon were normal.  Nonbleeding internal hemorrhoids. 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    Given that the biopsy may not be representative of the entire underlying lesion.  Patient was recommended to establish with surgeon for evaluation. Patient no showed for her surgery appointment and as well as her follow-up appointment with gastroenterology.  Per daughter, patient was having cervical spine surgery and she prioritized that over the colon surgery.  08/19/2021, patient establish care with surgery Dr. Christian Mate 08/21/2021, CT abdomen pelvis with contrast  showed soft tissue mass at the base of cecum measuring up to 5 cm, slightly increased in size.  Interval development of multiple low-density lesions throughout the liver highly suggestive for metastatic disease.  Complex mass in the region of the vaginal cuff measuring up to 5.2 cm, increased in size.  Right ovary also appears more prominent on the current examination.  Moderate large volume of stool throughout the colon.  Mild urinary bladder wall thickening.  Colonic diverticulosis without evidence of active diverticulitis.  Aortic atherosclerosis  09/04/2021, patient is status post left lobe liver lesion biopsy and pathology showed metastatic adenocarcinoma, compatible with colorectal primary.  She was referred to establish care with oncology. She was accompanied by her daughter today.  Patient walks with a walker.+ Night sweats + weight loss.  Denies any abdominal pain currently, melena, blood in the stool.     Review of Systems  Constitutional:  Positive  for fatigue and unexpected weight change.  Negative for appetite change, chills and fever.  HENT:   Negative for hearing loss and voice change.   Eyes:  Negative for eye problems.  Respiratory:  Negative for chest tightness and cough.   Cardiovascular:  Negative for chest pain.  Gastrointestinal:  Negative for abdominal distention, abdominal pain and blood in stool.  Endocrine: Negative for hot flashes.  Genitourinary:  Negative for difficulty urinating and frequency.   Musculoskeletal:  Negative for arthralgias.  Skin:  Negative for itching and rash.  Neurological:  Negative for extremity weakness.  Hematological:  Negative for adenopathy.  Psychiatric/Behavioral:  Negative for confusion.    MEDICAL HISTORY:  Past Medical History:  Diagnosis Date   Anxiety    Arthritis    Depression    Hyperlipidemia    Hypertension    Seizures ([place])    Spinal stenosis    Ulcer    Urinary incontinence     SURGICAL HISTORY: Past Surgical History:  Procedure Laterality Date   ABDOMINAL HYSTERECTOMY     BACK SURGERY     due to polio   BREAST BIOPSY Bilateral    neg   BREAST BIOPSY Right 2011   neg/stereo   CARPAL TUNNEL RELEASE     CATARACT EXTRACTION Right 2020   COLONOSCOPY WITH PROPOFOL N/A 04/04/2021   Procedure: COLONOSCOPY WITH PROPOFOL;  Surgeon: Virgel Manifold, MD;  Location: [facility] ENDOSCOPY;  Service: Endoscopy;  Laterality: N/A;   EYE SURGERY      SOCIAL HISTORY: Social History   Socioeconomic History   Marital status: Divorced    Spouse name: Not on file   Number of children: Not on file   Years of education: Not on file   Highest education level: Not on file  Occupational History   Not on file  Tobacco Use   Smoking status: Never   Smokeless tobacco: Never  Vaping Use   Vaping Use: Never used  Substance and Sexual Activity   Alcohol use: Never   Drug use: No   Sexual activity: Not on file  Other Topics Concern   Not on file  Social History Narrative   Not on file   Social Determinants of  Health   Financial Resource Strain: Not on file  Food Insecurity: Not on file  Transportation Needs: Not on file  Physical Activity: Not on file  Stress: Not on file  Social Connections: Not on file  Intimate Partner Violence: Not on file    FAMILY HISTORY: Family History  Problem Relation Age of Onset   Arthritis Mother    Heart disease Mother    Stroke Mother    Hypertension Mother    COPD Mother    Sudden death Sister    Other Father        unknown medical history   Breast cancer Neg Hx     ALLERGIES:  is allergic to penicillins.  MEDICATIONS:  Current Outpatient Medications  Medication Sig Dispense Refill   acetaminophen (TYLENOL) 500 MG tablet Take 500 mg by mouth every 6 (six) hours as needed.     ALPRAZolam (XANAX) 0.5 MG tablet TAKE 1 TABLET BY MOUTH TWICE A DAY AS NEEDED FOR ANXIETY 60 tablet 0   busPIRone (BUSPAR) 5 MG tablet Take 1 tablet (5 mg total) by mouth 3 (three) times daily. 270 tablet 1   Cyanocobalamin (VITAMIN B 12 PO) Take 500 mcg by mouth daily.     diclofenac Sodium (VOLTAREN) 1 % GEL  Apply 4 g topically 4 (four) times daily. 4 g 0   FLUoxetine (PROZAC) 20 MG capsule TAKE 3 CAPSULES (60 MG TOTAL) BY MOUTH EVERY MORNING. 270 capsule 0   gabapentin (NEURONTIN) 100 MG capsule TAKE 2 CAPSULES BY MOUTH 2 TIMES DAILY. 360 capsule 1   lidocaine (LIDODERM) 5 % Place 1 patch onto the skin daily. Remove & Discard patch within 12 hours or as directed by MD 1 patch 0   nitrofurantoin, macrocrystal-monohydrate, (MACROBID) 100 MG capsule Take 1 capsule (100 mg total) by mouth every 12 (twelve) hours. 14 capsule 0   polyethylene glycol powder (GLYCOLAX/MIRALAX) 17 GM/SCOOP powder Take 17 g by mouth daily. 3350 g 1   triamterene-hydrochlorothiazide (MAXZIDE-25) 37.5-25 MG tablet Take 1 tablet by mouth daily.     vitamin E 180 MG (400 UNITS) capsule Take 400 Units by mouth daily.     aspirin 325 MG tablet Take 325 mg by mouth daily. (Patient not taking: Reported on  09/11/2021)     meclizine (ANTIVERT) 12.5 MG tablet Take 1 tablet (12.5 mg total) by mouth 3 (three) times daily as needed for dizziness. (Patient not taking: Reported on 09/11/2021) 30 tablet 0   methocarbamol (ROBAXIN) 500 MG tablet Take 500 mg by mouth every 6 (six) hours as needed for muscle spasms. 1-2 tabs every 6 hours PRN. (Patient not taking: Reported on 08/25/2021)     No current facility-administered medications for this visit.     PHYSICAL EXAMINATION: ECOG PERFORMANCE STATUS: 2 - Symptomatic, <50% confined to bed Vitals:   09/15/21 1514  BP: (!) 144/83  Pulse: 74  Temp: 97.9 F (36.6 C)   Filed Weights   09/15/21 1514  Weight: 171 lb 4.8 oz (77.7 kg)    Physical Exam Constitutional:      General: She is not in acute distress.    Comments: Patient ambulates with a walker  HENT:     Head: Normocephalic and atraumatic.  Eyes:     General: No scleral icterus. Cardiovascular:     Rate and Rhythm: Normal rate and regular rhythm.     Heart sounds: Normal heart sounds.  Pulmonary:     Effort: Pulmonary effort is normal. No respiratory distress.     Breath sounds: No wheezing.  Abdominal:     General: Bowel sounds are normal. There is no distension.     Palpations: Abdomen is soft.  Musculoskeletal:        General: No deformity. Normal range of motion.     Cervical back: Normal range of motion and neck supple.  Skin:    General: Skin is warm and dry.     Findings: No erythema or rash.  Neurological:     Mental Status: She is alert and oriented to person, place, and time. Mental status is at baseline.     Cranial Nerves: No cranial nerve deficit.     Coordination: Coordination normal.  Psychiatric:     Comments: Emotional    LABORATORY DATA:  I have reviewed the data as listed Lab Results  Component Value Date   WBC 5.3 09/15/2021   HGB 11.1 (L) 09/15/2021   HCT 36.0 09/15/2021   MCV 95.5 09/15/2021   PLT 238 09/15/2021   Recent Labs    02/12/21 0209  03/23/21 1916 06/17/21 1008 08/19/21 1526 09/15/21 1626  NA 139 137 138  --  139  K 3.9 3.8 3.3*  --  3.5  CL 99 101 101  --  104  CO2  33* 28 27  --  28  GLUCOSE 104* 103* 97  --  89  BUN _0 CREATININE 0.93 0.99 0.77 0.91 0.84  CALCIUM 9.6 9.5 9.1  --  8.7*  GFRNONAA >60 59* >60 >60 >60  PROT 7.5 7.9  --   --  7.5  ALBUMIN 3.7 3.8  --   --  3.7  AST 21 15  --   --  25  ALT 17 12  --   --  20  ALKPHOS 80 69  --   --  84  BILITOT 0.5 0.7  --   --  0.7   Iron/TIBC/Ferritin/ %Sat    Component Value Date/Time   FERRITIN 58 10/24/2020 0402      RADIOGRAPHIC STUDIES: I have personally reviewed the radiological images as listed and agreed with the findings in the report. CT Abdomen Pelvis W Contrast  Result Date: 08/21/2021 CLINICAL DATA:  Colon mass. EXAM: CT ABDOMEN AND PELVIS WITH CONTRAST TECHNIQUE: Multidetector CT imaging of the abdomen and pelvis was performed using the standard protocol following bolus administration of intravenous contrast. CONTRAST:  112m OMNIPAQUE IOHEXOL 300 MG/ML  SOLN COMPARISON:  CT pelvis 03/24/2021; CT abdomen and pelvis 03/24/2021. FINDINGS: Lower chest: Mild bibasilar scarring. Heart size within normal limits. Hepatobiliary: Interval development of multiple low-density lesions throughout the liver, largest within the lateral aspect of the left hepatic lobe measuring 1.4 cm (series 2, image 11). Gallbladder unremarkable. No gallstone. Pancreas: Unremarkable. No pancreatic ductal dilatation or surrounding inflammatory changes. Spleen: Normal in size without focal abnormality. Adrenals/Urinary Tract: No adrenal nodule or mass. Bilateral renal cortical scarring and atrophy. Nonspecific cortical calcifications within both kidneys. No hydronephrosis. Mild urinary bladder wall thickening, improved in appearance from prior. Small left posterior bladder diverticulum. Stomach/Bowel: Eccentric lobulated soft tissue mass at the base of the cecum again  seen measuring approximately 5.0 x 3.5 x 4.1 cm. This appears to have slightly increased in size from the previous study when accounting for differences in slice selection. Moderate-large volume of stool throughout the colon. Scattered colonic diverticulosis. No dilated loops of bowel to suggest obstruction. No inflammatory changes. Vascular/Lymphatic: Scattered aortoiliac atherosclerotic calcifications without aneurysm. No abdominopelvic lymphadenopathy. Reproductive: Prior hysterectomy. Complex mass in the region of the vaginal cuff measuring approximately 5.2 x 4.3 x 4.8 cm (previously approximately 3.7 x 3.4 x 3.4 cm). Right ovary also appears more prominent on the current exam (series 2, image 47). Other: No free fluid. No abdominopelvic fluid collection. No pneumoperitoneum. No abdominal wall hernia. Musculoskeletal: Facet-predominant degenerative changes within the lower lumbar spine. No acute bony findings. No suspicious bony lesion. IMPRESSION: 1. Soft tissue mass at the base of the cecum measuring up to 5.0 cm appears to have slightly increased in size from the previous study. 2. Interval development of multiple low-density lesions throughout the liver, highly suggestive of metastatic disease. 3. Complex mass in the region of the vaginal cuff measuring up to 5.2 cm (previously approximately 3.7 cm). Right ovary also appears more prominent on the current exam. Contrast enhanced MRI of the pelvis is recommended, when clinically appropriate. 4. Moderate-large volume of stool throughout the colon. 5. Mild urinary bladder wall thickening, improved in appearance from prior. 6. Colonic diverticulosis without evidence of acute diverticulitis. 7. Aortic atherosclerosis (ICD10-I70.0). Electronically Signed   By: NDavina PokeD.O.   On: 08/21/2021 16:33   [place]  Result Date: 09/04/2021 INDICATION: 75year old female with history of indeterminate liver masses in the  setting of a cecal mass and vaginal cuff  mass of indeterminate etiology. Request for liver mass biopsy. EXAM: 1. Contrast-enhanced ultrasound of the liver. 2. Ultrasound guided liver lesion biopsy. COMPARISON:  08/21/2021 MEDICATIONS: 5.0 mL Lumason (sulfa hexafluoride), intravenous, 2 injections ANESTHESIA/SEDATION: Fentanyl 75 mcg IV; Versed 3 mg IV Total Moderate Sedation time:  45 minutes. The patient's level of consciousness and vital signs were monitored continuously by radiology nursing throughout the procedure under my direct supervision. COMPLICATIONS: None immediate. PROCEDURE: Informed written consent was obtained from the patient after a discussion of the risks, benefits and alternatives to treatment. The patient understands and consents the procedure. A timeout was performed prior to the initiation of the procedure. Ultrasound scanning was performed of the right upper abdominal quadrant demonstrates: LIVER Size: Normal. Echogenicity: Within normal limits. Surface Contour: Smooth. Observation 1 Lobe: Left Size: 1.8 x 1.7 x 1.3 cm Arterial phase enhancement features: Rim Onset of washout: Approximately 30 seconds Degree of washout: Marked Tumor in vein: No CEUS LI-RADS category: Not applicable. The mass in the left lobe liver was selected for biopsy and the procedure was planned. The right upper abdominal quadrant was prepped and draped in the usual sterile fashion. The overlying soft tissues were anesthetized with 1% lidocaine with epinephrine. A 17 gauge, 6.8 cm co-axial needle was unfortunately unable to be advanced into a peripheral aspect of the lesion due to overlying rib and cartilage. However, an additional smaller, subcentimeter lesion was visualized deeper in the left lobe during parenchymal enhancement phase. Therefore, this lesion was targeted and the coaxial needle was advanced into the peripheral aspect of the lesion. This was followed by a total of 5 core biopsies with an 18 gauge core device under direct ultrasound guidance. The  coaxial needle tract was embolized with a small amount of Gel-Foam slurry and superficial hemostasis was obtained with manual compression. Post procedural scanning was negative for definitive area of hemorrhage or additional complication. A dressing was placed. The patient tolerated the procedure well without immediate post procedural complication. IMPRESSION: Technically successful ultrasound guided core needle biopsy of left lobe liver lesion with contrast enhanced ultrasound characteristics of metastasis. Ruthann Cancer, MD [facility] Electronically Signed   By: Ruthann Cancer M.D.   On: 09/04/2021 16:24   US LIVER W/CM 1ST LESION  Result Date: 09/04/2021 INDICATION: 75 year old female with history of indeterminate liver masses in the setting of a cecal mass and vaginal cuff mass of indeterminate etiology. Request for liver mass biopsy. EXAM: 1. Contrast-enhanced ultrasound of the liver. 2. Ultrasound guided liver lesion biopsy. COMPARISON:  08/21/2021 MEDICATIONS: 5.0 mL Lumason (sulfa hexafluoride), intravenous, 2 injections ANESTHESIA/SEDATION: Fentanyl 75 mcg IV; Versed 3 mg IV Total Moderate Sedation time:  45 minutes. The patient's level of consciousness and vital signs were monitored continuously by radiology nursing throughout the procedure under my direct supervision. COMPLICATIONS: None immediate. PROCEDURE: Informed written consent was obtained from the patient after a discussion of the risks, benefits and alternatives to treatment. The patient understands and consents the procedure. A timeout was performed prior to the initiation of the procedure. Ultrasound scanning was performed of the right upper abdominal quadrant demonstrates: LIVER Size: Normal. Echogenicity: Within normal limits. Surface Contour: Smooth. Observation 1 Lobe: Left Size: 1.8 x 1.7 x 1.3 cm Arterial phase enhancement features: Rim Onset of washout: Approximately 30  seconds Degree of washout: Marked Tumor in vein: No CEUS LI-RADS category: Not applicable. The mass in the left lobe liver was  selected for biopsy and the procedure was planned. The right upper abdominal quadrant was prepped and draped in the usual sterile fashion. The overlying soft tissues were anesthetized with 1% lidocaine with epinephrine. A 17 gauge, 6.8 cm co-axial needle was unfortunately unable to be advanced into a peripheral aspect of the lesion due to overlying rib and cartilage. However, an additional smaller, subcentimeter lesion was visualized deeper in the left lobe during parenchymal enhancement phase. Therefore, this lesion was targeted and the coaxial needle was advanced into the peripheral aspect of the lesion. This was followed by a total of 5 core biopsies with an 18 gauge core device under direct ultrasound guidance. The coaxial needle tract was embolized with a small amount of Gel-Foam slurry and superficial hemostasis was obtained with manual compression. Post procedural scanning was negative for definitive area of hemorrhage or additional complication. A dressing was placed. The patient tolerated the procedure well without immediate post procedural complication. IMPRESSION: Technically successful ultrasound guided core needle biopsy of left lobe liver lesion with contrast enhanced ultrasound characteristics of metastasis. Ruthann Cancer, MD [facility] Electronically Signed   By: Ruthann Cancer M.D.   On: 09/04/2021 16:24      ASSESSMENT & PLAN:  1. Metastatic colon cancer to liver ([place])   2. Goals of care, counseling/discussion   3. Pelvic mass    Cancer Staging  Metastatic colon cancer to liver [facility]) Staging form: Colon and Rectum - Neuroendocine Tumors, AJCC 8th Edition - Clinical stage from 09/16/2021: Stage IV (cTX, cNX, cM1) - Signed by [name], MD on 09/16/2021  #Metastatic colon cancer to liver, and likely  pelvis Image findings and pathology report was reviewed and discussed with patient and daughter. There is insufficient tumor for ancillary studies. -We will check with pathology if mismatch repair can be added. The diagnosis and care plan were discussed with patient in detail.  The goal of treatment which is to palliate disease, disease related symptoms, improve quality of life and hopefully prolong life was highlighted in our discussion.   We discussed rationale of palliative systemic chemotherapy treatment [5-FU contained regimen +/- Bevacizumab] Check CT chest wo contrast   #Vaginal cuff complex mass, 5.2 cm  Likely metastatic disease.  Patient is emotional and currently undecided with she would like to proceed with treatment or not.  She will further discuss with her family members.  I will have her see palliative care service to establish care, discussion of goals of care and code status Check CBC, CMP, CEA  # Normocytic anemia, Hb 11.  Supportive care measures are necessary for patient well-being and will be provided as necessary. We spent sufficient time to discuss many aspect of care, questions were answered to patient's satisfaction.   Orders Placed This Encounter  Procedures   CT CHEST WO CONTRAST    Standing Status:   Future    Standing Expiration Date:   09/15/2022    Order Specific Question:   Preferred imaging location?    Answer:   [facility]   CBC with Differential/Platelet    Standing Status:   Future    Number of Occurrences:   1    Standing Expiration Date:   09/15/2022   CEA    Standing Status:   Future    Number of Occurrences:   1    Standing Expiration Date:   09/15/2022   Comprehensive metabolic panel    Standing Status:   Future    Number  of Occurrences:   1    Standing Expiration Date:   09/15/2022    All questions were answered. The patient knows to call the clinic with any problems questions or concerns.  cc Virgel Manifold, MD   Return of visit: a few days after CT chest for re-discussion   [name], MD, PhD 09/15/2021

## 2021-09-16 DIAGNOSIS — C189 Malignant neoplasm of colon, unspecified: Secondary | ICD-10-CM | POA: Insufficient documentation

## 2021-09-16 DIAGNOSIS — Z7189 Other specified counseling: Secondary | ICD-10-CM | POA: Insufficient documentation

## 2021-09-16 LAB — CEA: CEA: 34.2 ng/mL — ABNORMAL HIGH (ref 0.0–4.7)

## 2021-09-17 ENCOUNTER — Telehealth: Payer: Self-pay

## 2021-09-17 NOTE — Telephone Encounter (Signed)
Copied from [place] [id number]. Topic: General - Other >> Sep 16, 2021 10:01 AM Tessa Lerner A wrote: Reason for CRM: The patient has called to request the adivce of their PCP  The patient would like to know if they should continue taking aspirin 325 MG tablet [[id number]]  and triamterene-hydrochlorothiazide (MAXZIDE-25) 37.5-25 MG tablet   The patient was advised by a different doctor they've seen recently (they couldn't remember when/who) that they should discontinue the medication   Please contact further

## 2021-09-17 NOTE — Telephone Encounter (Signed)
Would continue both medications at this time.

## 2021-09-18 ENCOUNTER — Encounter: Payer: Self-pay | Admitting: Internal Medicine

## 2021-09-18 NOTE — Patient Instructions (Signed)
Colorectal Cancer Colorectal cancer is a cancerous (malignant) tumor in the colon or rectum, which are parts of the large intestine. A tumor is a mass of cells or tissue. The cancer can spread (metastasize) to other parts of the body. What are the causes? This condition is usually caused by abnormal growths called polyps on the inner wall of the colon or rectum. Left untreated, these polyps can develop into cancer. Other times, abnormal changes to genes (gene mutations) can cause cells to become cancerous. What increases the risk? The following factors may make you more likely to develop this condition: Being older than age 89. Having a personal or family history of colorectal cancer or polyps in your colon. Having diabetes, or having had cancer and cancer treatments such as radiation before. Having certain hereditary conditions, such as: Lynch syndrome. Familial adenomatous polyposis. Turcot syndrome. Peutz-Jeghers syndrome. MUTYH-associated polyposis (MAP). Being overweight or obese. Having a diet that is: High in red meats, such as beef, pork, lamb, or liver. High in precooked, cured, or other processed meat, such as sausages, meat loaves, and hot dogs. Low in fiber, such as fiber found in whole grains, fruits, and vegetables. Being inactive (sedentary), smoking, or drinking too much alcohol. Having an inflammatory bowel disease, such as ulcerative colitis or Crohn's disease. What are the signs or symptoms? Early colorectal cancer often does not cause symptoms. As the cancer grows, symptoms may include: Changes in bowel habits. Feeling like the bowel does not empty completely after a bowel movement. Stools (feces) that are narrower than usual, or blood in the stool or toilet after a bowel movement. The blood may be bright red or very dark in color. Diarrhea, constipation, or frequent gas pain. Anemia, constant tiredness (fatigue), or nausea and vomiting. Discomfort, pain, bloating,  fullness, or cramps in the abdomen. Unexplained weight loss. How is this diagnosed? This condition may be diagnosed with: A medical history. A physical exam. Tests. These may include: An exam of the rectum using a gloved finger (digital rectal exam). A stool test called a fecal occult blood test. Blood tests. A biopsy. This is removal of a tissue sample from the colon or rectum to be looked at under a microscope. You may also have other tests, including: X-rays, CT scans, MRIs, or a PET scan. A sigmoidoscopy. This test is done to view the inside of the rectum. A colonoscopy. This test is done to view the inside of the colon. During this test, small polyps can be removed or biopsies may be taken. An endorectal ultrasound. This test checks how deep a tumor in the rectum has grown and whether the cancer has spread to lymph nodes or other nearby tissues. Additional tests may be done to find out whether the cancer has spread to other parts of the body (what stage it is). The stages of cancer include: Stage 0 - At this stage, the cancer is found only in the innermost lining of the colon or rectum. The tumor has not spread to other tissue. Stage 1 (I) - At this stage, the cancer has grown into the inner wall (muscle layer) of the colon or rectum. Stage 2 (II) - At this stage, the cancer has grown more deeply into the wall of the colon or rectum or through the wall. It may have invaded nearby tissue or organs. Stage 3 (III) - At this stage, the cancer has spread to nearby lymph nodes or tissue near the lymph nodes. Stage 4 (IV) - At this stage,  the cancer has spread to other parts of the body that are not near the colon, such as the liver or lungs. How is this treated? Treatment for this condition depends on the type and stage of the cancer. Treatment may include: Surgery. In the early stages of the cancer, surgery may be done to remove polyps or small tumors from the colon. In later stages, surgery  may be done to remove part of the colon (partial colectomy). Chemotherapy. This treatment uses medicines to kill cancer cells. Targeted therapy. This treatment can kill tumor cells by targeting specific gene mutations or proteins that the cancer expresses. Immunotherapy (biologic therapy). This treatment uses your body's disease-fighting system (immune system) to fight the cancer. Substances made by your body or in a laboratory are used to boost, direct, or restore your body's natural defenses against cancer. Radiation therapy. This treatment uses radiation to kill cancer cells or shrink tumors. Radiofrequency ablation. This treatment uses radio waves to destroy the tumors that may have spread to other areas of the body, such as the liver. Follow these instructions at home: Take over-the-counter and prescription medicines only as told by your health care provider. Try to eat regular, healthy meals. Some of your treatments might affect your appetite. Ask to meet with a dietitian if you are having problems eating or with your appetite. Consider joining a support group. This may help you learn about your diagnosis and manage the stress of having colorectal cancer. If you are admitted to the hospital, tell your cancer care team. Keep all follow-up visits. This is important. How is this prevented? Colorectal cancer can be prevented with screening tests that find polyps so they can be removed before they develop into cancer. All adults should have screening for colorectal cancer starting at age 79 and continuing until age 60. Your health care provider may recommend screening before age 31. People at increased risk should start screening at an earlier age. You may be able to help reduce your risk of developing colorectal cancer by staying at a healthy weight, eating a healthy diet, avoiding tobacco and alcohol use, and being physically active. Where to find more information American Cancer Society:  cancer.[place]): cancer.gov Contact a health care provider if: Your diarrhea or constipation does not go away. You have blood in your stool or in the toilet after a bowel movement. Your bowel habits change. You have increased pain in your abdomen. You notice new fatigue or weakness. You lose weight without a known reason. Get help right away if: You have increased bleeding from the rectum. You have any uncontrollable or severe abdominal symptoms. Summary Colorectal cancer is a cancerous (malignant) tumor in the colon or rectum, which are parts of the large intestine. Common risk factors for this condition include being older than age 100, having a personal or family history of colorectal cancer or colon polyps, having certain hereditary conditions, or having conditions such as diabetes or inflammatory bowel disease. This condition may be diagnosed with tests, such as a colonoscopy and biopsy. Treatment depends on the type and stage of the cancer. Often, treatment includes surgery to remove the abnormal tissue, along with chemotherapy, targeted therapy, or immunotherapy. Keep all follow-up visits. This is important. This information is not intended to replace advice given to you by your health care provider. Make sure you discuss any questions you have with your health care provider. Document Revised: 01/10/2020 Document Reviewed: 01/10/2020 Elsevier Patient Education  [place].

## 2021-09-18 NOTE — Telephone Encounter (Signed)
Attempted to contact the patient, no answer. LMOM to return my call.

## 2021-09-22 ENCOUNTER — Other Ambulatory Visit: Payer: Self-pay

## 2021-09-22 ENCOUNTER — Ambulatory Visit
Admission: RE | Admit: 2021-09-22 | Discharge: 2021-09-22 | Disposition: A | Discharge status: Home / Self Care | Payer: Medicare Other | Source: Ambulatory Visit | Attending: Oncology | Admitting: Oncology

## 2021-09-22 DIAGNOSIS — C787 Secondary malignant neoplasm of liver and intrahepatic bile duct: Secondary | ICD-10-CM | POA: Diagnosis not present

## 2021-09-22 DIAGNOSIS — C189 Malignant neoplasm of colon, unspecified: Secondary | ICD-10-CM | POA: Insufficient documentation

## 2021-09-22 DIAGNOSIS — R911 Solitary pulmonary nodule: Secondary | ICD-10-CM | POA: Diagnosis not present

## 2021-09-22 DIAGNOSIS — R918 Other nonspecific abnormal finding of lung field: Secondary | ICD-10-CM | POA: Diagnosis not present

## 2021-09-22 DIAGNOSIS — I700 Atherosclerosis of aorta: Secondary | ICD-10-CM | POA: Diagnosis not present

## 2021-09-23 ENCOUNTER — Telehealth (INDEPENDENT_AMBULATORY_CARE_PROVIDER_SITE_OTHER): Payer: Medicare Other | Admitting: Gastroenterology

## 2021-09-23 DIAGNOSIS — C189 Malignant neoplasm of colon, unspecified: Secondary | ICD-10-CM

## 2021-09-23 DIAGNOSIS — C787 Secondary malignant neoplasm of liver and intrahepatic bile duct: Secondary | ICD-10-CM | POA: Diagnosis not present

## 2021-09-23 NOTE — Progress Notes (Signed)
Chelsea Antigua, MD [address]  [place]  [place], [place] [address]  Main: [phone]  Fax: [phone]   Primary Care Physician: Jearld Fenton, NP  Virtual Visit via Video Note  I connected with patient on 09/23/21 at  1:30 PM EST by video and verified that I am speaking with the correct person using two identifiers.   I discussed the limitations, risks, security and privacy concerns of performing an evaluation and management service by video and the availability of in person appointments. I also discussed with the patient that there may be a patient responsible charge related to this service. The patient expressed understanding and agreed to proceed.  Location of Patient: Home Location of Provider: Home Persons involved: Patient and provider only (Nursing staff checked in patient via phone but were not physically involved in the video interaction - see their notes)   History of Present Illness: Chief Complaint  Patient presents with   Follow-up  Colon cancer follow-up  HPI: Chelsea [name] is a 75 y.o. female presents with her daughter for follow-up.  The patient denies abdominal or flank pain, anorexia, nausea or vomiting, dysphagia, change in bowel habits or black or bloody stools or weight loss.  They state patient has an appointment with Dr. Tasia Catchings to discuss further steps  Since her last visit patient has seen Dr. Tasia Catchings, general surgery.  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  They have ordered further imaging and started discussion for palliative systemic chemotherapy as prior oncology's latest notes.  Previous history:  female previously seen at the time of her outpatient colonoscopy.  Patient was referred to oncology and surgery due to unresectable colon polyp.  Roderic Ovens, help coordinate her visit, but patient was no-show for her surgery appointment and her previous appointment with [place] as well.  Patient  states because she was having cervical spine surgery, she prioritized that over her colon surgery.   The patient denies abdominal or flank pain, anorexia, nausea or vomiting, dysphagia, change in bowel habits or black or bloody stools or weight loss.   Colonoscopy April 04, 2021   Impression:            - Rule out malignancy, tumor in the cecum. Biopsied.                        - Terminal ileum was briefly intubated and appeared                         normal.                        - Diverticulosis in the sigmoid colon.                        - The rectum, sigmoid colon, descending colon,                         transverse colon and ascending colon are normal.                        - Non-bleeding internal hemorrhoids.   DIAGNOSIS:  A. COLON MASS, CECUM; COLD BIOPSY:  - TUBULOVILLOUS ADENOMA WITH FOCAL HIGH-GRADE DYSPLASIA.  - SEE COMMENT.   Comment:  There is no definitive evidence of  invasive carcinoma in this sample.  However, the biopsy may not be representative of the entire underlying  lesion.  Clinical correlation is recommended.  Deeper levels were  examined.     Current Outpatient Medications  Medication Sig Dispense Refill   acetaminophen (TYLENOL) 500 MG tablet Take 500 mg by mouth every 6 (six) hours as needed.     ALPRAZolam (XANAX) 0.5 MG tablet TAKE 1 TABLET BY MOUTH TWICE A DAY AS NEEDED FOR ANXIETY 60 tablet 0   aspirin 325 MG tablet Take 325 mg by mouth daily. (Patient not taking: Reported on 09/11/2021)     busPIRone (BUSPAR) 5 MG tablet Take 1 tablet (5 mg total) by mouth 3 (three) times daily. 270 tablet 1   Cyanocobalamin (VITAMIN B 12 PO) Take 500 mcg by mouth daily.     diclofenac Sodium (VOLTAREN) 1 % GEL Apply 4 g topically 4 (four) times daily. 4 g 0   FLUoxetine (PROZAC) 20 MG capsule TAKE 3 CAPSULES (60 MG TOTAL) BY MOUTH EVERY MORNING. 270 capsule 0   gabapentin (NEURONTIN) 100 MG capsule TAKE 2 CAPSULES BY MOUTH 2 TIMES DAILY. 360 capsule 1    lidocaine (LIDODERM) 5 % Place 1 patch onto the skin daily. Remove & Discard patch within 12 hours or as directed by MD 1 patch 0   meclizine (ANTIVERT) 12.5 MG tablet Take 1 tablet (12.5 mg total) by mouth 3 (three) times daily as needed for dizziness. (Patient not taking: Reported on 09/11/2021) 30 tablet 0   methocarbamol (ROBAXIN) 500 MG tablet Take 500 mg by mouth every 6 (six) hours as needed for muscle spasms. 1-2 tabs every 6 hours PRN. (Patient not taking: Reported on 08/25/2021)     nitrofurantoin, macrocrystal-monohydrate, (MACROBID) 100 MG capsule Take 1 capsule (100 mg total) by mouth every 12 (twelve) hours. 14 capsule 0   polyethylene glycol powder (GLYCOLAX/MIRALAX) 17 GM/SCOOP powder Take 17 g by mouth daily. 3350 g 1   triamterene-hydrochlorothiazide (MAXZIDE-25) 37.5-25 MG tablet Take 1 tablet by mouth daily.     vitamin E 180 MG (400 UNITS) capsule Take 400 Units by mouth daily.     No current facility-administered medications for this visit.    Allergies as of 09/23/2021 - Review Complete 09/18/2021  Allergen Reaction Noted   Penicillins  02/06/2011    Review of Systems:    All systems reviewed and negative except where noted in HPI.   Observations/Objective:  Labs: CMP     Component Value Date/Time   NA 139 09/15/2021 1626   K 3.5 09/15/2021 1626   CL 104 09/15/2021 1626   CO2 28 09/15/2021 1626   GLUCOSE 89 09/15/2021 1626   BUN 14 09/15/2021 1626   CREATININE 0.84 09/15/2021 1626   CREATININE 0.87 08/13/2020 1129   CALCIUM 8.7 (L) 09/15/2021 1626   PROT 7.5 09/15/2021 1626   ALBUMIN 3.7 09/15/2021 1626   AST 25 09/15/2021 1626   ALT 20 09/15/2021 1626   ALKPHOS 84 09/15/2021 1626   BILITOT 0.7 09/15/2021 1626   GFRNONAA >60 09/15/2021 1626   GFRNONAA 65 08/13/2020 1129   GFRAA 76 08/13/2020 1129   Lab Results  Component Value Date   WBC 5.3 09/15/2021   HGB 11.1 (L) 09/15/2021   HCT 36.0 09/15/2021   MCV 95.5 09/15/2021   PLT 238 09/15/2021     Imaging Studies: CT CHEST WO CONTRAST  Result Date: 09/23/2021 CLINICAL DATA:  Colon mass EXAM: CT CHEST WITHOUT CONTRAST TECHNIQUE: Multidetector CT imaging of  the chest was performed following the standard protocol without IV contrast. COMPARISON:  CT abdomen and pelvis dated August 21, 2021 FINDINGS: Cardiovascular: No pericardial effusion. No significant coronary artery calcifications. Atherosclerotic disease of the thoracic aorta. Mediastinum/Nodes: Calcified lymph nodes of the mediastinum and hilum. No pathologically enlarged lymph nodes seen in the chest. Lungs/Pleura: Central airways are patent. Calcified nodules of the right middle lobe and left lower lobe. Ground-glass opacities of the right upper lobe, lingula and left lower lobe with associated linear opacities that likely represent atelectasis or scarring. Nonspecific small solid pulmonary nodules. Largest is located in the right upper lobe and measures 4 mm on series 2, image 27. Upper Abdomen: New heterogeneous lesion of the left lobe of the liver measuring 5.2 x 4.1 cm. Additional scattered subtle low-attenuation lesions are seen which are similar to slightly increased in size when compared with prior exam. Parenchymal calcifications seen in the bilateral kidneys. No acute abnormality. 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 No aggressive appearing osseous lesions. IMPRESSION: 1. No definite evidence of metastatic disease in the chest. 2. Nonspecific small solid pulmonary nodules. Largest is located in the right upper lobe and measures 4 mm. Recommend attention on follow-up. 3. Ground-glass opacities of the right upper lobe, lingula and left lower lobe with associated linear opacities that likely represent atelectasis or scarring. Findings are likely infectious or inflammatory. Recommend attention on follow-up. 4. New heterogeneous lesion of the left lobe of  the liver measuring up to 5.2 cm, likely a hematoma related to recent biopsy. Contrast-enhanced MRI of the liver could be performed if further evaluation is desired. 5. Additional scattered subtle low-attenuation liver lesions are similar to slightly increased in size when compared with prior exam, evaluation is limited due to lack of IV contrast. 6. Aortic Atherosclerosis (ICD10-I70.0). Electronically Signed   By: Yetta Glassman M.D.   On: 09/23/2021 10:02   US Biopsy  Result Date: 09/04/2021 INDICATION: 75 year old female with history of indeterminate liver masses in the setting of a cecal mass and vaginal cuff mass of indeterminate etiology. Request for liver mass biopsy. EXAM: 1. Contrast-enhanced ultrasound of the liver. 2. Ultrasound guided liver lesion biopsy. COMPARISON:  08/21/2021 MEDICATIONS: 5.0 mL Lumason (sulfa hexafluoride), intravenous, 2 injections ANESTHESIA/SEDATION: Fentanyl 75 mcg IV; Versed 3 mg IV Total Moderate Sedation time:  45 minutes. The patient's level of consciousness and vital signs were monitored continuously by radiology nursing throughout the procedure under my direct supervision. COMPLICATIONS: None immediate. PROCEDURE: Informed written consent was obtained from the patient after a discussion of the risks, benefits and alternatives to treatment. The patient understands and consents the procedure. A timeout was performed prior to the initiation of the procedure. Ultrasound scanning was performed of the right upper abdominal quadrant demonstrates: LIVER Size: Normal. Echogenicity: Within normal limits. Surface Contour: Smooth. Observation 1 Lobe: Left Size: 1.8 x 1.7 x 1.3 cm Arterial phase enhancement features: Rim Onset of washout: Approximately 30 seconds Degree of washout: Marked Tumor in vein: No CEUS LI-RADS category: Not applicable. The mass in the left lobe liver was selected for biopsy and the procedure was planned. The right upper abdominal quadrant was prepped and  draped in the usual sterile fashion. The overlying soft tissues were anesthetized with 1% lidocaine with epinephrine. A 17 gauge, 6.8 cm co-axial needle was unfortunately unable to be advanced into a peripheral aspect of the lesion due to overlying rib and cartilage. However, an additional smaller, subcentimeter lesion  was visualized deeper in the left lobe during parenchymal enhancement phase. Therefore, this lesion was targeted and the coaxial needle was advanced into the peripheral aspect of the lesion. This was followed by a total of 5 core biopsies with an 18 gauge core device under direct ultrasound guidance. The coaxial needle tract was embolized with a small amount of Gel-Foam slurry and superficial hemostasis was obtained with manual compression. Post procedural scanning was negative for definitive area of hemorrhage or additional complication. A dressing was placed. The patient tolerated the procedure well without immediate post procedural complication. IMPRESSION: Technically successful ultrasound guided core needle biopsy of left lobe liver lesion with contrast enhanced ultrasound characteristics of metastasis. Ruthann Cancer, MD [facility] Radiology Electronically Signed   By: Ruthann Cancer M.D.   On: 09/04/2021 16:24   US LIVER W/CM 1ST LESION  Result Date: 09/04/2021 INDICATION: 75 year old female with history of indeterminate liver masses in the setting of a cecal mass and vaginal cuff mass of indeterminate etiology. Request for liver mass biopsy. EXAM: 1. Contrast-enhanced ultrasound of the liver. 2. Ultrasound guided liver lesion biopsy. COMPARISON:  08/21/2021 MEDICATIONS: 5.0 mL Lumason (sulfa hexafluoride), intravenous, 2 injections ANESTHESIA/SEDATION: Fentanyl 75 mcg IV; Versed 3 mg IV Total Moderate Sedation time:  45 minutes. The patient's level of consciousness and vital signs were monitored continuously by radiology nursing throughout the  procedure under my direct supervision. COMPLICATIONS: None immediate. PROCEDURE: Informed written consent was obtained from the patient after a discussion of the risks, benefits and alternatives to treatment. The patient understands and consents the procedure. A timeout was performed prior to the initiation of the procedure. Ultrasound scanning was performed of the right upper abdominal quadrant demonstrates: LIVER Size: Normal. Echogenicity: Within normal limits. Surface Contour: Smooth. Observation 1 Lobe: Left Size: 1.8 x 1.7 x 1.3 cm Arterial phase enhancement features: Rim Onset of washout: Approximately 30 seconds Degree of washout: Marked Tumor in vein: No CEUS LI-RADS category: Not applicable. The mass in the left lobe liver was selected for biopsy and the procedure was planned. The right upper abdominal quadrant was prepped and draped in the usual sterile fashion. The overlying soft tissues were anesthetized with 1% lidocaine with epinephrine. A 17 gauge, 6.8 cm co-axial needle was unfortunately unable to be advanced into a peripheral aspect of the lesion due to overlying rib and cartilage. However, an additional smaller, subcentimeter lesion was visualized deeper in the left lobe during parenchymal enhancement phase. Therefore, this lesion was targeted and the coaxial needle was advanced into the peripheral aspect of the lesion. This was followed by a total of 5 core biopsies with an 18 gauge core device under direct ultrasound guidance. The coaxial needle tract was embolized with a small amount of Gel-Foam slurry and superficial hemostasis was obtained with manual compression. Post procedural scanning was negative for definitive area of hemorrhage or additional complication. A dressing was placed. The patient tolerated the procedure well without immediate post procedural complication. IMPRESSION: Technically successful ultrasound guided core needle biopsy of left lobe liver lesion with contrast enhanced  ultrasound characteristics of metastasis. Ruthann Cancer, MD [facility] Radiology Electronically Signed   By: Ruthann Cancer M.D.   On: 09/04/2021 16:24    Assessment and Plan:   Chelsea [name] is a 75 y.o. y/o female here for follow-up, initially seen in our clinic for outpatient colonoscopy with an unresectable polyp seen in the cecum, showing high-grade dysplasia on biopsies, subsequently diagnosed  with colon cancer with liver and possible pelvic mets, currently undergoing oncology work-up  Assessment and Plan: Patient has previously been a no-show to her oncology/surgery appointments as has previously been documented.  Daughter had several questions and these were answered to her satisfaction.  I do not have previous colonoscopy records as patient and daughter states these were done out of state before 2018 when she moved to [place].  We did discuss that in 2020 she was referred for a colonoscopy to [facility] clinic and as per their notes from 11/09/2018 in [place] "Patient forgot about procedure wishes to CXL"  We discussed to close follow up with oncology remains a priority at this time to determine next steps and explained the importance of these to pt and family  She is not having any GI symptoms at this time  Follow Up Instructions: Follow up in 6 weeks or earlier if needed   I discussed the assessment and treatment plan with the patient. The patient was provided an opportunity to ask questions and all were answered. The patient agreed with the plan and demonstrated an understanding of the instructions.   The patient was advised to call back or seek an in-person evaluation if the symptoms worsen or if the condition fails to improve as anticipated.  I provided 15 minutes of face-to-face time via video software during this encounter. Additional time was spent in reviewing patient's chart, placing orders etc.   Virgel Manifold, MD  Speech recognition software was used to dictate this note.

## 2021-09-25 ENCOUNTER — Encounter: Payer: Self-pay | Admitting: Oncology

## 2021-09-25 ENCOUNTER — Inpatient Hospital Stay (HOSPITAL_BASED_OUTPATIENT_CLINIC_OR_DEPARTMENT_OTHER): Payer: Medicare Other | Admitting: Hospice and Palliative Medicine

## 2021-09-25 ENCOUNTER — Inpatient Hospital Stay (HOSPITAL_BASED_OUTPATIENT_CLINIC_OR_DEPARTMENT_OTHER): Payer: Medicare Other | Admitting: Oncology

## 2021-09-25 ENCOUNTER — Other Ambulatory Visit: Payer: Self-pay

## 2021-09-25 VITALS — BP 129/74 | HR 83 | Temp 98.7°F | Resp 20 | Wt 180.4 lb

## 2021-09-25 DIAGNOSIS — C787 Secondary malignant neoplasm of liver and intrahepatic bile duct: Secondary | ICD-10-CM

## 2021-09-25 DIAGNOSIS — R1900 Intra-abdominal and pelvic swelling, mass and lump, unspecified site: Secondary | ICD-10-CM

## 2021-09-25 DIAGNOSIS — C189 Malignant neoplasm of colon, unspecified: Secondary | ICD-10-CM

## 2021-09-25 DIAGNOSIS — Z7189 Other specified counseling: Secondary | ICD-10-CM

## 2021-09-25 DIAGNOSIS — F3289 Other specified depressive episodes: Secondary | ICD-10-CM

## 2021-09-25 DIAGNOSIS — F32A Depression, unspecified: Secondary | ICD-10-CM | POA: Diagnosis not present

## 2021-09-25 DIAGNOSIS — Z515 Encounter for palliative care: Secondary | ICD-10-CM | POA: Diagnosis not present

## 2021-09-25 DIAGNOSIS — Z79899 Other long term (current) drug therapy: Secondary | ICD-10-CM | POA: Diagnosis not present

## 2021-09-25 MED ORDER — ONDANSETRON HCL 8 MG PO TABS: 8.0000 mg | ORAL_TABLET | Freq: Two times a day (BID) | ORAL | 1 refills | Status: DC | PRN

## 2021-09-25 MED ORDER — LIDOCAINE-PRILOCAINE 2.5-2.5 % EX CREA: TOPICAL_CREAM | CUTANEOUS | 3 refills | Status: DC

## 2021-09-25 MED ORDER — PROCHLORPERAZINE MALEATE 10 MG PO TABS: 10.0000 mg | ORAL_TABLET | Freq: Four times a day (QID) | ORAL | 1 refills | Status: DC | PRN

## 2021-09-25 NOTE — Progress Notes (Signed)
Hematology/Oncology follow up note [phone] [phone]      Patient Care Team: Jearld Fenton, NP as PCP - General (Internal Medicine) Clent Jacks, RN as Oncology Nurse Navigator  REFERRING PROVIDER: Jearld Fenton, NP  CHIEF COMPLAINTS/REASON FOR VISIT:  metastatic colon cancer  HISTORY OF PRESENTING ILLNESS:   Chelsea [name] is a  75 y.o.  female with PMH listed below was seen in consultation at the request of  Jearld Fenton, NP  for evaluation of metastatic colon cancer.  Patient initially went to emergency room on 03/24/2021 for abdominal pain. 03/24/2021, CT scan of the abdomen showed marked bladder wall thickening with surrounding soft tissue stranding is noted.  Concerning for cystitis.  There is a lobulated mass between the posterior wall of the bladder and the rectum which appears to arise from the vaginal cuff.  This is indeterminate and difficult to characterize reflecting lack of IV contrast material.  Nonobstructing left renal calculus, left sacroiliitis, lumbar spondylosis aortic atherosclerosis. 03/24/2021 CT pelvic showed abdominal [place] soft tissue of right cecum, concerning for colon carcinoma.  Colonoscopy recommended.  Low-attenuation mass in the region of vaginal cuff is again noted.  Diffuse urinary bladder wall thickening and mild bilateral hydroureter possibly cystitis.  [place] 03/24/2021 Lobulated solid 3.8 cm mass confirmed by ultrasound. This is most compatible with a neoplasm but remains indeterminate as to the origin as the epicenter seems to be outside of both the vaginal cuff and the adjacent colon, while the lesion appears inseparable from both. Extensive echogenic debris within the distended urinary bladder.   03/26/2021, patient was seen by Dr. Theora Gianotti for evaluation of colonic/vaginal cuff pelvic mass.  Patient also was referred to gastroenterology for colonoscopy.  03/26/2021 CEA 3.4.  CA125 9.7 04/04/2021, status post  colonoscopy which showed a frond like /villous nonobstructing large mass was found in the cecum, this was biopsied.  Terminal ileum was briefly intubated and appeared normal.  Diverticulosis in the sigmoid colon.  The rectum, sigmoid colon, descending colon, transverse colon and ascending colon were normal.  Nonbleeding internal hemorrhoids. 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    Given that the biopsy may not be representative of the entire underlying lesion.  Patient was recommended to establish with surgeon for evaluation. Patient no showed for her surgery appointment and as well as her follow-up appointment with gastroenterology.  Per daughter, patient was having cervical spine surgery and she prioritized that over the colon surgery.  08/19/2021, patient establish care with surgery Dr. Christian Mate 08/21/2021, CT abdomen pelvis with contrast  showed soft tissue mass at the base of cecum measuring up to 5 cm, slightly increased in size.  Interval development of multiple low-density lesions throughout the liver highly suggestive for metastatic disease.  Complex mass in the region of the vaginal cuff measuring up to 5.2 cm, increased in size.  Right ovary also appears more prominent on the current examination.  Moderate large volume of stool throughout the colon.  Mild urinary bladder wall thickening.  Colonic diverticulosis without evidence of active diverticulitis.  Aortic atherosclerosis  09/04/2021, patient is status post left lobe liver lesion biopsy and pathology showed metastatic adenocarcinoma, compatible with colorectal primary.  She was referred to establish care with oncology. She was accompanied by her daughter today.  Patient walks with a walker.+ Night sweats + weight loss.  Denies any abdominal pain currently, melena, blood in the stool.  She is currently being followed by home health PT/OT following a  previous spinal surgery INTERVAL HISTORY Chelsea [name] is a 75 y.o. female who has above history reviewed by me today presents for follow up visit for management of metastatic colon cancer Patient was accompanied by her daughter to review CT results and further discussing treatment plan.    Review of Systems  Constitutional:  Positive for fatigue and unexpected weight change. Negative for appetite change, chills and fever.  HENT:   Negative for hearing loss and voice change.   Eyes:  Negative for eye problems.  Respiratory:  Negative for chest tightness and cough.   Cardiovascular:  Negative for chest pain.  Gastrointestinal:  Negative for abdominal distention, abdominal pain and blood in stool.  Endocrine: Negative for hot flashes.  Genitourinary:  Negative for difficulty urinating and frequency.   Musculoskeletal:  Positive for back pain. Negative for arthralgias.  Skin:  Negative for itching and rash.  Neurological:  Positive for numbness (chornic finger tips). Negative for extremity weakness.  Hematological:  Negative for adenopathy.  Psychiatric/Behavioral:  Negative for confusion.    MEDICAL HISTORY:  Past Medical History:  Diagnosis Date   Anxiety    Arthritis    Depression    Hyperlipidemia    Hypertension    Seizures ([place])    Spinal stenosis    Ulcer    Urinary incontinence     SURGICAL HISTORY: Past Surgical History:  Procedure Laterality Date   ABDOMINAL HYSTERECTOMY     BACK SURGERY     due to polio   BREAST BIOPSY Bilateral    neg   BREAST BIOPSY Right 2011   neg/stereo   CARPAL TUNNEL RELEASE     CATARACT EXTRACTION Right 2020   COLONOSCOPY WITH PROPOFOL N/A 04/04/2021   Procedure: COLONOSCOPY WITH PROPOFOL;  Surgeon: Virgel Manifold, MD;  Location: [facility] ENDOSCOPY;  Service: Endoscopy;  Laterality: N/A;   EYE SURGERY      SOCIAL HISTORY: Social History   Socioeconomic History   Marital status: Divorced    Spouse name: Not on file   Number of children: Not on file   Years of education: Not  on file   Highest education level: Not on file  Occupational History   Not on file  Tobacco Use   Smoking status: Never   Smokeless tobacco: Never  Vaping Use   Vaping Use: Never used  Substance and Sexual Activity   Alcohol use: Never   Drug use: No   Sexual activity: Not on file  Other Topics Concern   Not on file  Social History Narrative   Not on file   Social Determinants of Health   Financial Resource Strain: Not on file  Food Insecurity: Not on file  Transportation Needs: Not on file  Physical Activity: Not on file  Stress: Not on file  Social Connections: Not on file  Intimate Partner Violence: Not on file    FAMILY HISTORY: Family History  Problem Relation Age of Onset   Arthritis Mother    Heart disease Mother    Stroke Mother    Hypertension Mother    COPD Mother    Sudden death Sister    Other Father        unknown medical history   Breast cancer Neg Hx     ALLERGIES:  is allergic to penicillins.  MEDICATIONS:  Current Outpatient Medications  Medication Sig Dispense Refill   acetaminophen (TYLENOL) 500 MG tablet Take 500 mg by mouth every 6 (six) hours as needed.  ALPRAZolam (XANAX) 0.5 MG tablet TAKE 1 TABLET BY MOUTH TWICE A DAY AS NEEDED FOR ANXIETY 60 tablet 0   aspirin 325 MG tablet Take 325 mg by mouth daily.     busPIRone (BUSPAR) 5 MG tablet Take 1 tablet (5 mg total) by mouth 3 (three) times daily. 270 tablet 1   Cyanocobalamin (VITAMIN B 12 PO) Take 500 mcg by mouth daily.     diclofenac Sodium (VOLTAREN) 1 % GEL Apply 4 g topically 4 (four) times daily. 4 g 0   FLUoxetine (PROZAC) 20 MG capsule TAKE 3 CAPSULES (60 MG TOTAL) BY MOUTH EVERY MORNING. 270 capsule 0   gabapentin (NEURONTIN) 100 MG capsule TAKE 2 CAPSULES BY MOUTH 2 TIMES DAILY. 360 capsule 1   lidocaine (LIDODERM) 5 % Place 1 patch onto the skin daily. Remove & Discard patch within 12 hours or as directed by MD 1 patch 0   polyethylene glycol powder (GLYCOLAX/MIRALAX) 17  GM/SCOOP powder Take 17 g by mouth daily. 3350 g 1   triamterene-hydrochlorothiazide (MAXZIDE-25) 37.5-25 MG tablet Take 1 tablet by mouth daily.     vitamin E 180 MG (400 UNITS) capsule Take 400 Units by mouth daily.     meclizine (ANTIVERT) 12.5 MG tablet Take 1 tablet (12.5 mg total) by mouth 3 (three) times daily as needed for dizziness. (Patient not taking: Reported on 09/11/2021) 30 tablet 0   methocarbamol (ROBAXIN) 500 MG tablet Take 500 mg by mouth every 6 (six) hours as needed for muscle spasms. 1-2 tabs every 6 hours PRN. (Patient not taking: Reported on 08/25/2021)     nitrofurantoin, macrocrystal-monohydrate, (MACROBID) 100 MG capsule Take 1 capsule (100 mg total) by mouth every 12 (twelve) hours. (Patient not taking: Reported on 09/25/2021) 14 capsule 0   No current facility-administered medications for this visit.     PHYSICAL EXAMINATION: ECOG PERFORMANCE STATUS: 2 - Symptomatic, <50% confined to bed Vitals:   09/25/21 1355  BP: 129/74  Pulse: 83  Resp: 20  Temp: 98.7 F (37.1 C)  SpO2: 98%   Filed Weights   09/25/21 1355  Weight: 180 lb 6.4 oz (81.8 kg)    Physical Exam Constitutional:      General: She is not in acute distress.    Comments: Patient ambulates with a walker  HENT:     Head: Normocephalic and atraumatic.  Eyes:     General: No scleral icterus. Cardiovascular:     Rate and Rhythm: Normal rate and regular rhythm.     Heart sounds: Normal heart sounds.  Pulmonary:     Effort: Pulmonary effort is normal. No respiratory distress.     Breath sounds: No wheezing.  Abdominal:     General: Bowel sounds are normal. There is no distension.     Palpations: Abdomen is soft.  Musculoskeletal:        General: No deformity. Normal range of motion.     Cervical back: Normal range of motion and neck supple.  Skin:    General: Skin is warm and dry.     Findings: No erythema or rash.  Neurological:     Mental Status: She is alert and oriented to person,  place, and time. Mental status is at baseline.     Cranial Nerves: No cranial nerve deficit.     Coordination: Coordination normal.  Psychiatric:        Mood and Affect: Mood normal.    LABORATORY DATA:  I have reviewed the data as listed Lab Results  Component Value Date   WBC 5.3 09/15/2021   HGB 11.1 (L) 09/15/2021   HCT 36.0 09/15/2021   MCV 95.5 09/15/2021   PLT 238 09/15/2021   Recent Labs    02/12/21 0209 03/23/21 1916 06/17/21 1008 08/19/21 1526 09/15/21 1626  NA 139 137 138  --  139  K 3.9 3.8 3.3*  --  3.5  CL 99 101 101  --  104  CO2 33* 28 27  --  28  GLUCOSE 104* 103* 97  --  89  BUN 19 19 14 14 14   CREATININE 0.93 0.99 0.77 0.91 0.84  CALCIUM 9.6 9.5 9.1  --  8.7*  GFRNONAA >60 59* >60 >60 >60  PROT 7.5 7.9  --   --  7.5  ALBUMIN 3.7 3.8  --   --  3.7  AST 21 15  --   --  25  ALT 17 12  --   --  20  ALKPHOS 80 69  --   --  84  BILITOT 0.5 0.7  --   --  0.7    Iron/TIBC/Ferritin/ %Sat    Component Value Date/Time   FERRITIN 58 10/24/2020 0402       RADIOGRAPHIC STUDIES: I have personally reviewed the radiological images as listed and agreed with the findings in the report. CT CHEST WO CONTRAST  Result Date: 09/23/2021 CLINICAL DATA:  Colon mass EXAM: CT CHEST WITHOUT CONTRAST TECHNIQUE: Multidetector CT imaging of the chest was performed following the standard protocol without IV contrast. COMPARISON:  CT abdomen and pelvis dated August 21, 2021 FINDINGS: Cardiovascular: No pericardial effusion. No significant coronary artery calcifications. Atherosclerotic disease of the thoracic aorta. Mediastinum/Nodes: Calcified lymph nodes of the mediastinum and hilum. No pathologically enlarged lymph nodes seen in the chest. Lungs/Pleura: Central airways are patent. Calcified nodules of the right middle lobe and left lower lobe. Ground-glass opacities of the right upper lobe, lingula and left lower lobe with associated linear opacities that likely represent  atelectasis or scarring. Nonspecific small solid pulmonary nodules. Largest is located in the right upper lobe and measures 4 mm on series 2, image 27. Upper Abdomen: New heterogeneous lesion of the left lobe of the liver measuring 5.2 x 4.1 cm. Additional scattered subtle low-attenuation lesions are seen which are similar to slightly increased in size when compared with prior exam. Parenchymal calcifications seen in the bilateral kidneys. No acute abnormality. 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 No aggressive appearing osseous lesions. IMPRESSION: 1. No definite evidence of metastatic disease in the chest. 2. Nonspecific small solid pulmonary nodules. Largest is located in the right upper lobe and measures 4 mm. Recommend attention on follow-up. 3. Ground-glass opacities of the right upper lobe, lingula and left lower lobe with associated linear opacities that likely represent atelectasis or scarring. Findings are likely infectious or inflammatory. Recommend attention on follow-up. 4. New heterogeneous lesion of the left lobe of the liver measuring up to 5.2 cm, likely a hematoma related to recent biopsy. Contrast-enhanced MRI of the liver could be performed if further evaluation is desired. 5. Additional scattered subtle low-attenuation liver lesions are similar to slightly increased in size when compared with prior exam, evaluation is limited due to lack of IV contrast. 6. Aortic Atherosclerosis (ICD10-I70.0). Electronically Signed   By: Yetta Glassman M.D.   On: 09/23/2021 10:02   US Biopsy  Result Date: 09/04/2021 INDICATION: 75 year old female with history of indeterminate liver masses  in the setting of a cecal mass and vaginal cuff mass of indeterminate etiology. Request for liver mass biopsy. EXAM: 1. Contrast-enhanced ultrasound of the liver. 2. Ultrasound guided liver lesion biopsy. COMPARISON:  08/21/2021  MEDICATIONS: 5.0 mL Lumason (sulfa hexafluoride), intravenous, 2 injections ANESTHESIA/SEDATION: Fentanyl 75 mcg IV; Versed 3 mg IV Total Moderate Sedation time:  45 minutes. The patient's level of consciousness and vital signs were monitored continuously by radiology nursing throughout the procedure under my direct supervision. COMPLICATIONS: None immediate. PROCEDURE: Informed written consent was obtained from the patient after a discussion of the risks, benefits and alternatives to treatment. The patient understands and consents the procedure. A timeout was performed prior to the initiation of the procedure. Ultrasound scanning was performed of the right upper abdominal quadrant demonstrates: LIVER Size: Normal. Echogenicity: Within normal limits. Surface Contour: Smooth. Observation 1 Lobe: Left Size: 1.8 x 1.7 x 1.3 cm Arterial phase enhancement features: Rim Onset of washout: Approximately 30 seconds Degree of washout: Marked Tumor in vein: No CEUS LI-RADS category: Not applicable. The mass in the left lobe liver was selected for biopsy and the procedure was planned. The right upper abdominal quadrant was prepped and draped in the usual sterile fashion. The overlying soft tissues were anesthetized with 1% lidocaine with epinephrine. A 17 gauge, 6.8 cm co-axial needle was unfortunately unable to be advanced into a peripheral aspect of the lesion due to overlying rib and cartilage. However, an additional smaller, subcentimeter lesion was visualized deeper in the left lobe during parenchymal enhancement phase. Therefore, this lesion was targeted and the coaxial needle was advanced into the peripheral aspect of the lesion. This was followed by a total of 5 core biopsies with an 18 gauge core device under direct ultrasound guidance. The coaxial needle tract was embolized with a small amount of Gel-Foam slurry and superficial hemostasis was obtained with manual compression. Post procedural scanning was negative for  definitive area of hemorrhage or additional complication. A dressing was placed. The patient tolerated the procedure well without immediate post procedural complication. IMPRESSION: Technically successful ultrasound guided core needle biopsy of left lobe liver lesion with contrast enhanced ultrasound characteristics of metastasis. Ruthann Cancer, MD [facility] Radiology Electronically Signed   By: Ruthann Cancer M.D.   On: 09/04/2021 16:24   US LIVER W/CM 1ST LESION  Result Date: 09/04/2021 INDICATION: 75 year old female with history of indeterminate liver masses in the setting of a cecal mass and vaginal cuff mass of indeterminate etiology. Request for liver mass biopsy. EXAM: 1. Contrast-enhanced ultrasound of the liver. 2. Ultrasound guided liver lesion biopsy. COMPARISON:  08/21/2021 MEDICATIONS: 5.0 mL Lumason (sulfa hexafluoride), intravenous, 2 injections ANESTHESIA/SEDATION: Fentanyl 75 mcg IV; Versed 3 mg IV Total Moderate Sedation time:  45 minutes. The patient's level of consciousness and vital signs were monitored continuously by radiology nursing throughout the procedure under my direct supervision. COMPLICATIONS: None immediate. PROCEDURE: Informed written consent was obtained from the patient after a discussion of the risks, benefits and alternatives to treatment. The patient understands and consents the procedure. A timeout was performed prior to the initiation of the procedure. Ultrasound scanning was performed of the right upper abdominal quadrant demonstrates: LIVER Size: Normal. Echogenicity: Within normal limits. Surface Contour: Smooth. Observation 1 Lobe: Left Size: 1.8 x 1.7 x 1.3 cm Arterial phase enhancement features: Rim Onset of washout: Approximately 30 seconds Degree of washout: Marked Tumor in vein: No CEUS LI-RADS category: Not applicable. The mass in the left lobe liver  was selected for biopsy and the procedure was planned. The right  upper abdominal quadrant was prepped and draped in the usual sterile fashion. The overlying soft tissues were anesthetized with 1% lidocaine with epinephrine. A 17 gauge, 6.8 cm co-axial needle was unfortunately unable to be advanced into a peripheral aspect of the lesion due to overlying rib and cartilage. However, an additional smaller, subcentimeter lesion was visualized deeper in the left lobe during parenchymal enhancement phase. Therefore, this lesion was targeted and the coaxial needle was advanced into the peripheral aspect of the lesion. This was followed by a total of 5 core biopsies with an 18 gauge core device under direct ultrasound guidance. The coaxial needle tract was embolized with a small amount of Gel-Foam slurry and superficial hemostasis was obtained with manual compression. Post procedural scanning was negative for definitive area of hemorrhage or additional complication. A dressing was placed. The patient tolerated the procedure well without immediate post procedural complication. IMPRESSION: Technically successful ultrasound guided core needle biopsy of left lobe liver lesion with contrast enhanced ultrasound characteristics of metastasis. Ruthann Cancer, MD [facility] Radiology Electronically Signed   By: Ruthann Cancer M.D.   On: 09/04/2021 16:24      ASSESSMENT & PLAN:  1. Metastatic colon cancer to liver ([place])   2. Goals of care, counseling/discussion   3. Pelvic mass   4. Other depression    Cancer Staging  Metastatic colon cancer to liver [facility]) Staging form: Colon and Rectum - Neuroendocine Tumors, AJCC 8th Edition - Clinical stage from 09/16/2021: Stage IV (cTX, cNX, cM1) - Signed by [name], MD on 09/16/2021  #Metastatic colon cancer to liver, 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  No sufficient tissue for NGS  Image findings and pathology report was reviewed and discussed with patient and daughter. CT chest wo contrast did not showed any  definitive metastatic disease in her chest. Non specific lung nodules.  Ground glass opacities of right upper lobe, lingula and left lower lobe, likely atelectasis or scarring.  The diagnosis and care plan were discussed with patient and her daughter in detail.   The goal of treatment which is to palliate disease, disease related symptoms, improve quality of life and hopefully prolong life was highlighted in our discussion.  Chemotherapy education was provided.  We had discussed the composition of chemotherapy regimen, length of chemo cycle, duration of treatment and the time to assess response to treatment.    I explained to the patient the risks and benefits of chemotherapy including all but not limited to hair loss, mouth sore, nausea, vomiting, diarrhea, low blood counts, bleeding, neuropathy, cardiovascular disease, stroke, and risk of life threatening infection and even death,etc.   Patient voices understanding and she is willing to proceed chemotherapy.  Plan to start palliative chemotherapy in Jan 2023  # Chemotherapy education; refer to IR for Medi- port placement. Antiemetics-Zofran and Compazine; EMLA cream sent to pharmacy  # refer to palliative care service. She has appt today with Altha Harm.  # Depression, refer to psychiatry  # Complex mass in the region of the vaginal cuff, this could be metastatic disease or a second primary. Recommend her to re-establish care with GynOnc.  Check CA 125.   Supportive care measures are necessary for patient well-being and will be provided as necessary. We spent sufficient time to discuss many aspect of care, questions were answered to patient's satisfaction.   Supportive care measures are necessary for patient well-being and will be provided as  necessary. We spent sufficient time to discuss many aspect of care, questions were answered to patient's satisfaction.   Orders Placed This Encounter  Procedures   IR IMAGING GUIDED PORT  INSERTION    Standing Status:   Future    Standing Expiration Date:   09/25/2022    Order Specific Question:   Reason for Exam (SYMPTOM  OR DIAGNOSIS REQUIRED)    Answer:   port placement for chemotherapy    Order Specific Question:   Preferred Imaging Location?    Answer:   [facility]    All questions were answered. The patient knows to call the clinic with any problems questions or concerns.  cc Jearld Fenton, NP    [name], MD, PhD 09/25/2021

## 2021-09-25 NOTE — Progress Notes (Signed)
Patient on plan of care prior to pathways. 

## 2021-09-25 NOTE — Progress Notes (Signed)
[place] at [facility] Telephone:([phone]) [phone] Fax:([phone]) [phone]   Name: Chelsea [name] Date: 09/25/2021 MRN: [id number]  DOB: 1946/04/29  Patient Care Team: Jearld Fenton, NP as PCP - General (Internal Medicine) Clent Jacks, RN as Oncology Nurse Navigator    REASON FOR CONSULTATION: Chelsea [name] is a 75 y.o. 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  Palliative care was consulted to help address goals.  SOCIAL HISTORY:     reports that she has never smoked. She has never used smokeless tobacco. She reports that she does not drink alcohol and does not use drugs.  Patient lives at home alone at [facility].  She has a daughter who is involved in her care.  ADVANCE DIRECTIVES:  Not on file  CODE STATUS:   PAST MEDICAL HISTORY: Past Medical History:  Diagnosis Date   Anxiety    Arthritis    Depression    Hyperlipidemia    Hypertension    Seizures (Protection)    Spinal stenosis    Ulcer    Urinary incontinence     PAST SURGICAL HISTORY:  Past Surgical History:  Procedure Laterality Date   ABDOMINAL HYSTERECTOMY     BACK SURGERY     due to polio   BREAST BIOPSY Bilateral    neg   BREAST BIOPSY Right 2011   neg/stereo   CARPAL TUNNEL RELEASE     CATARACT EXTRACTION Right 2020   COLONOSCOPY WITH PROPOFOL N/A 04/04/2021   Procedure: COLONOSCOPY WITH PROPOFOL;  Surgeon: Virgel Manifold, MD;  Location: [facility] ENDOSCOPY;  Service: Endoscopy;  Laterality: N/A;   EYE SURGERY      HEMATOLOGY/ONCOLOGY HISTORY:  Oncology History  Metastatic colon cancer to liver ([place])  09/16/2021 Initial Diagnosis   Metastatic colon cancer to liver ([place])   09/16/2021 Cancer Staging   Staging form: Colon and Rectum - Neuroendocine Tumors, AJCC 8th Edition - Clinical stage from 09/16/2021: Stage IV (cTX,  cNX, cM1) - Signed by [name], MD on 09/16/2021 Stage prefix: Initial diagnosis      ALLERGIES:  is allergic to penicillins.  MEDICATIONS:  Current Outpatient Medications  Medication Sig Dispense Refill   acetaminophen (TYLENOL) 500 MG tablet Take 500 mg by mouth every 6 (six) hours as needed.     ALPRAZolam (XANAX) 0.5 MG tablet TAKE 1 TABLET BY MOUTH TWICE A DAY AS NEEDED FOR ANXIETY 60 tablet 0   aspirin 325 MG tablet Take 325 mg by mouth daily.     busPIRone (BUSPAR) 5 MG tablet Take 1 tablet (5 mg total) by mouth 3 (three) times daily. 270 tablet 1   Cyanocobalamin (VITAMIN B 12 PO) Take 500 mcg by mouth daily.     diclofenac Sodium (VOLTAREN) 1 % GEL Apply 4 g topically 4 (four) times daily. 4 g 0   FLUoxetine (PROZAC) 20 MG capsule TAKE 3 CAPSULES (60 MG TOTAL) BY MOUTH EVERY MORNING. 270 capsule 0   gabapentin (NEURONTIN) 100 MG capsule TAKE 2 CAPSULES BY MOUTH 2 TIMES DAILY. 360 capsule 1   lidocaine (LIDODERM) 5 % Place 1 patch onto the skin daily. Remove & Discard patch within 12 hours or as directed by MD 1 patch 0   meclizine (ANTIVERT) 12.5 MG tablet Take 1 tablet (12.5 mg total) by mouth 3 (three) times daily as needed for dizziness. (Patient not taking: Reported on 09/11/2021)  30 tablet 0   methocarbamol (ROBAXIN) 500 MG tablet Take 500 mg by mouth every 6 (six) hours as needed for muscle spasms. 1-2 tabs every 6 hours PRN. (Patient not taking: Reported on 08/25/2021)     nitrofurantoin, macrocrystal-monohydrate, (MACROBID) 100 MG capsule Take 1 capsule (100 mg total) by mouth every 12 (twelve) hours. (Patient not taking: Reported on 09/25/2021) 14 capsule 0   polyethylene glycol powder (GLYCOLAX/MIRALAX) 17 GM/SCOOP powder Take 17 g by mouth daily. 3350 g 1   triamterene-hydrochlorothiazide (MAXZIDE-25) 37.5-25 MG tablet Take 1 tablet by mouth daily.     vitamin E 180 MG (400 UNITS) capsule Take 400 Units by mouth daily.     No current facility-administered medications for  this visit.    VITAL SIGNS: There were no vitals taken for this visit. There were no vitals filed for this visit.  Estimated body mass index is 39.04 kg/m as calculated from the following:   Height as of 09/11/21: $RemoveBef'4\' 9"'lIEWHsFyLa$  (1.448 m).   Weight as of an earlier encounter on 09/25/21: 180 lb 6.4 oz (81.8 kg).  LABS: CBC:    Component Value Date/Time   WBC 5.3 09/15/2021 1626   HGB 11.1 (L) 09/15/2021 1626   HCT 36.0 09/15/2021 1626   PLT 238 09/15/2021 1626   MCV 95.5 09/15/2021 1626   NEUTROABS 2.3 09/15/2021 1626   LYMPHSABS 2.2 09/15/2021 1626   MONOABS 0.7 09/15/2021 1626   EOSABS 0.2 09/15/2021 1626   BASOSABS 0.0 09/15/2021 1626   Comprehensive Metabolic Panel:    Component Value Date/Time   NA 139 09/15/2021 1626   K 3.5 09/15/2021 1626   CL 104 09/15/2021 1626   CO2 28 09/15/2021 1626   BUN 14 09/15/2021 1626   CREATININE 0.84 09/15/2021 1626   CREATININE 0.87 08/13/2020 1129   GLUCOSE 89 09/15/2021 1626   CALCIUM 8.7 (L) 09/15/2021 1626   AST 25 09/15/2021 1626   ALT 20 09/15/2021 1626   ALKPHOS 84 09/15/2021 1626   BILITOT 0.7 09/15/2021 1626   PROT 7.5 09/15/2021 1626   ALBUMIN 3.7 09/15/2021 1626    RADIOGRAPHIC STUDIES: CT CHEST WO CONTRAST  Result Date: 09/23/2021 CLINICAL DATA:  Colon mass EXAM: CT CHEST WITHOUT CONTRAST TECHNIQUE: Multidetector CT imaging of the chest was performed following the standard protocol without IV contrast. COMPARISON:  CT abdomen and pelvis dated August 21, 2021 FINDINGS: Cardiovascular: No pericardial effusion. No significant coronary artery calcifications. Atherosclerotic disease of the thoracic aorta. Mediastinum/Nodes: Calcified lymph nodes of the mediastinum and hilum. No pathologically enlarged lymph nodes seen in the chest. Lungs/Pleura: Central airways are patent. Calcified nodules of the right middle lobe and left lower lobe. Ground-glass opacities of the right upper lobe, lingula and left lower lobe with associated  linear opacities that likely represent atelectasis or scarring. Nonspecific small solid pulmonary nodules. Largest is located in the right upper lobe and measures 4 mm on series 2, image 27. Upper Abdomen: New heterogeneous lesion of the left lobe of the liver measuring 5.2 x 4.1 cm. Additional scattered subtle low-attenuation lesions are seen which are similar to slightly increased in size when compared with prior exam. Parenchymal calcifications seen in the bilateral kidneys. No acute abnormality. 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 No aggressive appearing osseous lesions. IMPRESSION: 1. No definite evidence of metastatic disease in the chest. 2. Nonspecific small solid pulmonary nodules. Largest is located in the right upper lobe and measures 4 mm. Recommend  attention on follow-up. 3. Ground-glass opacities of the right upper lobe, lingula and left lower lobe with associated linear opacities that likely represent atelectasis or scarring. Findings are likely infectious or inflammatory. Recommend attention on follow-up. 4. New heterogeneous lesion of the left lobe of the liver measuring up to 5.2 cm, likely a hematoma related to recent biopsy. Contrast-enhanced MRI of the liver could be performed if further evaluation is desired. 5. Additional scattered subtle low-attenuation liver lesions are similar to slightly increased in size when compared with prior exam, evaluation is limited due to lack of IV contrast. 6. Aortic Atherosclerosis (ICD10-I70.0). Electronically Signed   By: Yetta Glassman M.D.   On: 09/23/2021 10:02   US Biopsy  Result Date: 09/04/2021 INDICATION: 75 year old female with history of indeterminate liver masses in the setting of a cecal mass and vaginal cuff mass of indeterminate etiology. Request for liver mass biopsy. EXAM: 1. Contrast-enhanced ultrasound of the liver. 2. Ultrasound guided liver lesion  biopsy. COMPARISON:  08/21/2021 MEDICATIONS: 5.0 mL Lumason (sulfa hexafluoride), intravenous, 2 injections ANESTHESIA/SEDATION: Fentanyl 75 mcg IV; Versed 3 mg IV Total Moderate Sedation time:  45 minutes. The patient's level of consciousness and vital signs were monitored continuously by radiology nursing throughout the procedure under my direct supervision. COMPLICATIONS: None immediate. PROCEDURE: Informed written consent was obtained from the patient after a discussion of the risks, benefits and alternatives to treatment. The patient understands and consents the procedure. A timeout was performed prior to the initiation of the procedure. Ultrasound scanning was performed of the right upper abdominal quadrant demonstrates: LIVER Size: Normal. Echogenicity: Within normal limits. Surface Contour: Smooth. Observation 1 Lobe: Left Size: 1.8 x 1.7 x 1.3 cm Arterial phase enhancement features: Rim Onset of washout: Approximately 30 seconds Degree of washout: Marked Tumor in vein: No CEUS LI-RADS category: Not applicable. The mass in the left lobe liver was selected for biopsy and the procedure was planned. The right upper abdominal quadrant was prepped and draped in the usual sterile fashion. The overlying soft tissues were anesthetized with 1% lidocaine with epinephrine. A 17 gauge, 6.8 cm co-axial needle was unfortunately unable to be advanced into a peripheral aspect of the lesion due to overlying rib and cartilage. However, an additional smaller, subcentimeter lesion was visualized deeper in the left lobe during parenchymal enhancement phase. Therefore, this lesion was targeted and the coaxial needle was advanced into the peripheral aspect of the lesion. This was followed by a total of 5 core biopsies with an 18 gauge core device under direct ultrasound guidance. The coaxial needle tract was embolized with a small amount of Gel-Foam slurry and superficial hemostasis was obtained with manual compression. Post  procedural scanning was negative for definitive area of hemorrhage or additional complication. A dressing was placed. The patient tolerated the procedure well without immediate post procedural complication. IMPRESSION: Technically successful ultrasound guided core needle biopsy of left lobe liver lesion with contrast enhanced ultrasound characteristics of metastasis. Ruthann Cancer, MD [facility] Radiology Electronically Signed   By: Ruthann Cancer M.D.   On: 09/04/2021 16:24   US LIVER W/CM 1ST LESION  Result Date: 09/04/2021 INDICATION: 75 year old female with history of indeterminate liver masses in the setting of a cecal mass and vaginal cuff mass of indeterminate etiology. Request for liver mass biopsy. EXAM: 1. Contrast-enhanced ultrasound of the liver. 2. Ultrasound guided liver lesion biopsy. COMPARISON:  08/21/2021 MEDICATIONS: 5.0 mL Lumason (sulfa hexafluoride), intravenous, 2 injections ANESTHESIA/SEDATION: Fentanyl 75 mcg IV; Versed  3 mg IV Total Moderate Sedation time:  45 minutes. The patient's level of consciousness and vital signs were monitored continuously by radiology nursing throughout the procedure under my direct supervision. COMPLICATIONS: None immediate. PROCEDURE: Informed written consent was obtained from the patient after a discussion of the risks, benefits and alternatives to treatment. The patient understands and consents the procedure. A timeout was performed prior to the initiation of the procedure. Ultrasound scanning was performed of the right upper abdominal quadrant demonstrates: LIVER Size: Normal. Echogenicity: Within normal limits. Surface Contour: Smooth. Observation 1 Lobe: Left Size: 1.8 x 1.7 x 1.3 cm Arterial phase enhancement features: Rim Onset of washout: Approximately 30 seconds Degree of washout: Marked Tumor in vein: No CEUS LI-RADS category: Not applicable. The mass in the left lobe liver was selected for biopsy and  the procedure was planned. The right upper abdominal quadrant was prepped and draped in the usual sterile fashion. The overlying soft tissues were anesthetized with 1% lidocaine with epinephrine. A 17 gauge, 6.8 cm co-axial needle was unfortunately unable to be advanced into a peripheral aspect of the lesion due to overlying rib and cartilage. However, an additional smaller, subcentimeter lesion was visualized deeper in the left lobe during parenchymal enhancement phase. Therefore, this lesion was targeted and the coaxial needle was advanced into the peripheral aspect of the lesion. This was followed by a total of 5 core biopsies with an 18 gauge core device under direct ultrasound guidance. The coaxial needle tract was embolized with a small amount of Gel-Foam slurry and superficial hemostasis was obtained with manual compression. Post procedural scanning was negative for definitive area of hemorrhage or additional complication. A dressing was placed. The patient tolerated the procedure well without immediate post procedural complication. IMPRESSION: Technically successful ultrasound guided core needle biopsy of left lobe liver lesion with contrast enhanced ultrasound characteristics of metastasis. Ruthann Cancer, MD [facility] Radiology Electronically Signed   By: Ruthann Cancer M.D.   On: 09/04/2021 16:24    PERFORMANCE STATUS (ECOG) : 3 - Symptomatic, >50% confined to bed  Review of Systems Unless otherwise noted, a complete review of systems is negative.  Physical Exam General: NAD Pulmonary: Unlabored Extremities: no edema, no joint deformities Skin: no rashes Neurological: Weakness but otherwise nonfocal  IMPRESSION: I met with patient and daughter following their visit with Dr. Tasia Catchings.  Patient has decided that she would like to pursue chemotherapy.  Plan is to start 5-FU.  Patient and daughter had questions regarding expected symptoms and possible  adverse effects related to the chemotherapy.  All questions were answered to their verbalized satisfaction.  At baseline, patient lives alone at [facility].  She is wheelchair-bound with right-sided weakness.  She is currently being followed by home health PT/OT following a previous spinal surgery.  Patient does have an aide who is assisting with care at home.  However, daughter is concerned that patient may require additional resources.  Will consult home palliative care and social work to help coordinate.  Patient does endorse some emotional distress.  She would be interested in speaking with a psychologist.  Patient is on active treatment for depression with fluoxetine 60 mg daily.  She also has alprazolam ordered as needed for anxiety.  Patient will benefit from future conversation regarding advance care planning.  PLAN: -Continue current scope of treatment -Referrals for palliative care, social work, nutrition, and clinical psychology -She will benefit from future ACP conversation -RTC 3 weeks   Patient expressed  understanding and was in agreement with this plan. She also understands that She can call the clinic at any time with any questions, concerns, or complaints.     Time Total: 20 minutes  Visit consisted of counseling and education dealing with the complex and emotionally intense issues of symptom management and palliative care in the setting of serious and potentially life-threatening illness.Greater than 50%  of this time was spent counseling and coordinating care related to the above assessment and plan.  Signed by: Altha Harm, PhD, NP-C

## 2021-09-25 NOTE — Progress Notes (Signed)
START ON PATHWAY REGIMEN - Colorectal     A cycle is every 14 days:     Bevacizumab-xxxx      Leucovorin      Fluorouracil      Fluorouracil   **Always confirm dose/schedule in your pharmacy ordering system**  Patient Characteristics: Distant Metastases, Nonsurgical Candidate, KRAS/NRAS Mutation Positive/Unknown (BRAF V600 Wild-Type/Unknown), Standard Cytotoxic Therapy, First Line Standard Cytotoxic Therapy, Bevacizumab Eligible, PS > 1 Tumor Location: Colon Therapeutic Status: Distant Metastases Microsatellite/Mismatch Repair Status: Unknown BRAF Mutation Status: Quantity Not Sufficient KRAS/NRAS Mutation Status: Quantity Not Sufficient Standard Cytotoxic Line of Therapy: First Line Standard Cytotoxic Therapy ECOG Performance Status: 2 Bevacizumab Eligibility: Eligible Intent of Therapy: Non-Curative / Palliative Intent, Discussed with Patient

## 2021-09-26 ENCOUNTER — Encounter: Payer: Self-pay | Admitting: Licensed Clinical Social Worker

## 2021-09-26 NOTE — Progress Notes (Signed)
[place] Work  Clinical Social Work was referred by [employer] NP [employer] for assessment of psychosocial needs.  Clinical Social Worker contacted patient by phone and caregiver Radford Pax Roth,Denise (Daughter) [phone] (Mobile)  to offer support and assess for needs.  CSW left voicemail for patient and Ms. Turner Alice Rieger, [place] Worker [facility]

## 2021-09-30 ENCOUNTER — Encounter: Payer: Self-pay | Admitting: Licensed Clinical Social Worker

## 2021-09-30 ENCOUNTER — Encounter: Payer: Self-pay | Admitting: *Deleted

## 2021-09-30 ENCOUNTER — Telehealth: Payer: Self-pay | Admitting: *Deleted

## 2021-09-30 ENCOUNTER — Telehealth: Payer: Self-pay | Admitting: Oncology

## 2021-09-30 LAB — SURGICAL PATHOLOGY

## 2021-09-30 NOTE — Telephone Encounter (Signed)
Daughter called to reschedule pt's appt for 1-11. Would like something week before or week after. Call back at [phone]

## 2021-09-30 NOTE — Telephone Encounter (Signed)
PA needed for EMLA cream. Pa submitted via covermymeds- pending insurance approval  Howard Pouch Key: [id number] - PA Case ID: [id number] - Rx #: [id number]

## 2021-09-30 NOTE — Progress Notes (Unsigned)
[place] Work  Clinical Social Work was referred by Pete Glatter NP [facility] for assessment of psychosocial needs.  Clinical Social Worker  left voicemail for the patient and her daughter Ms. Turner-Roth   to offer support and assess for needs.  CSW requested return call.      Adelene Amas, [place]

## 2021-09-30 NOTE — Telephone Encounter (Signed)
Chelsea [name] is scheduled for IR Port on Thurs 10/09/21 @1 :30p  Arrive @12 :30p. Unable to reach pt's daughter, VM left. Spoke to pt, but she didn't seem to understand what I was talking about.

## 2021-09-30 NOTE — Telephone Encounter (Signed)
Status: PA Response - Approved °

## 2021-09-30 NOTE — Telephone Encounter (Signed)
Per Dr. Tasia [name], she does not recommend to move appt because it will delay tx, but its ultimately up to her. Since there is no room the week prior, then it will have to be the week after

## 2021-10-01 NOTE — Telephone Encounter (Signed)
Spoke to pt's daughter [name]) and informed her of all appts: chemo edu, port placement, first tx along with palliative and Nutrition appt. She verbalized understanding but she would like a call back to understand a little more about what pt should expect when she goes home with the pump. Will ask Rosa to see if she can give her a call.

## 2021-10-02 ENCOUNTER — Inpatient Hospital Stay: Payer: Medicare Other

## 2021-10-03 ENCOUNTER — Other Ambulatory Visit: Payer: Self-pay | Admitting: Internal Medicine

## 2021-10-03 DIAGNOSIS — F411 Generalized anxiety disorder: Secondary | ICD-10-CM

## 2021-10-03 NOTE — Telephone Encounter (Signed)
Requested medication (s) are due for refill today: yes  Requested medication (s) are on the active medication list: yes  Last refill:  09/02/21 #60  Future visit scheduled: yes  Notes to clinic:  Please review for refill. Refill not delegated per protocol    Requested Prescriptions  Pending Prescriptions Disp Refills   ALPRAZolam (XANAX) 0.5 MG tablet [Pharmacy Med Name: ALPRAZOLAM 0.5 MG TABLET] 60 tablet 0    Sig: TAKE 1 TABLET BY MOUTH TWICE A DAY AS NEEDED FOR ANXIETY     Not Delegated - Psychiatry:  Anxiolytics/Hypnotics Failed - 10/03/2021  9:01 AM      Failed - This refill cannot be delegated      Failed - Urine Drug Screen completed in last 360 days      Passed - Valid encounter within last 6 months    Recent Outpatient Visits           3 weeks ago 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 [facility])   [facility], Coralie Keens, NP   2 months ago [facility], Coralie Keens, NP   4 months ago Cervical myelopathy [facility])   [facility], Coralie Keens, NP   6 months ago Colonic mass   [place], Coralie Keens, NP   6 months ago GAD (generalized anxiety disorder)   [facility], Coralie Keens, NP       Future Appointments             In 3 weeks [place], Coralie Keens, NP [facility], [facility]

## 2021-10-05 ENCOUNTER — Encounter: Payer: Self-pay | Admitting: Oncology

## 2021-10-07 ENCOUNTER — Encounter: Payer: Self-pay | Admitting: Licensed Clinical Social Worker

## 2021-10-07 ENCOUNTER — Other Ambulatory Visit: Payer: Self-pay

## 2021-10-07 ENCOUNTER — Inpatient Hospital Stay: Payer: Medicare HMO | Attending: Oncology

## 2021-10-07 DIAGNOSIS — C787 Secondary malignant neoplasm of liver and intrahepatic bile duct: Secondary | ICD-10-CM | POA: Insufficient documentation

## 2021-10-07 DIAGNOSIS — D6481 Anemia due to antineoplastic chemotherapy: Secondary | ICD-10-CM | POA: Insufficient documentation

## 2021-10-07 DIAGNOSIS — E876 Hypokalemia: Secondary | ICD-10-CM | POA: Insufficient documentation

## 2021-10-07 DIAGNOSIS — K8020 Calculus of gallbladder without cholecystitis without obstruction: Secondary | ICD-10-CM | POA: Insufficient documentation

## 2021-10-07 DIAGNOSIS — C180 Malignant neoplasm of cecum: Secondary | ICD-10-CM | POA: Insufficient documentation

## 2021-10-07 DIAGNOSIS — D701 Agranulocytosis secondary to cancer chemotherapy: Secondary | ICD-10-CM | POA: Insufficient documentation

## 2021-10-07 DIAGNOSIS — Z5111 Encounter for antineoplastic chemotherapy: Secondary | ICD-10-CM | POA: Insufficient documentation

## 2021-10-07 DIAGNOSIS — Z5112 Encounter for antineoplastic immunotherapy: Secondary | ICD-10-CM | POA: Insufficient documentation

## 2021-10-07 DIAGNOSIS — R531 Weakness: Secondary | ICD-10-CM | POA: Insufficient documentation

## 2021-10-07 DIAGNOSIS — R350 Frequency of micturition: Secondary | ICD-10-CM | POA: Insufficient documentation

## 2021-10-07 DIAGNOSIS — Z79899 Other long term (current) drug therapy: Secondary | ICD-10-CM | POA: Insufficient documentation

## 2021-10-07 NOTE — Progress Notes (Signed)
[place] CSW Progress Note  Clinical Education officer, museum contacted patient by phone to follow-up on referral request.  CSW left voicemail for patient.  This is the third call and voicemail left for patient.    Adelene Amas , LCSW

## 2021-10-07 NOTE — Progress Notes (Signed)
Pharmacist Chemotherapy Monitoring - Initial Assessment    Anticipated start date: 10/15/21   The following has been reviewed per standard work regarding the patient's treatment regimen: The patient's diagnosis, treatment plan and drug doses, and organ/hematologic function Lab orders and baseline tests specific to treatment regimen  The treatment plan start date, drug sequencing, and pre-medications Prior authorization status  Patient's documented medication list, including drug-drug interaction screen and prescriptions for anti-emetics and supportive care specific to the treatment regimen The drug concentrations, fluid compatibility, administration routes, and timing of the medications to be used The patient's access for treatment and lifetime cumulative dose history, if applicable  The patient's medication allergies and previous infusion related reactions, if applicable   Changes made to treatment plan:  treatment plan date  Follow up needed:  signing treatment plan   Adelina Mings, [place], 10/07/2021  1:20 PM 1

## 2021-10-07 NOTE — Progress Notes (Signed)
[place] Work  Initial Assessment   Chelsea [name] is a 76 y.o. year old female contacted by phone. Clinical Social Work was referred by [facility] RN for assessment of psychosocial needs.   SDOH (Social Determinants of Health) assessments performed: No   Distress Screen completed: No ONCBCN DISTRESS SCREENING 03/26/2021  Screening Type Initial Screening  Distress experienced in past week (1-10) 3      Family/Social Information:  Housing Arrangement: patient lives [place] Family members/support persons in your life? Family, Friends/Colleagues, and [employer] concerns: no  Employment: Retired. 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 no Concerns about diagnosis and/or treatment: I'm not especially worried about anything Patient reported stressors:  Possibility if needing more care giving assistance Hopes and priorities: N/A Patient enjoys watching TV and time with family/ friends Current coping skills/ strengths: Average or above average intelligence , Capable of independent living , and Supportive family/friends     SUMMARY: Current SDOH Barriers:  N/A  Clinical Social Work Clinical Goal(s):  Patient will speak with daughter and private duty caregiver and discuss increasing hours.  Interventions: Discussed common feeling and emotions when being diagnosed with cancer, and the importance of support during treatment Informed patient of the support team roles and support services at [facility] Provided CSW contact information and encouraged patient to call with any questions or concerns Provided patient with information about what I am able to assist  with.   Follow Up Plan: Patient will follow-up if shwe has any questions. Patient verbalizes understanding of plan: Yes  Patient lives in independent living, has private care giver and main support is her Chelsea [name],Chelsea (Daughter) [phone] (Mobile).  Patient will discuss with daughter about increasing care assistance. Patient also started with [facility] for home health services.   Adelene Amas , LCSW

## 2021-10-07 NOTE — Progress Notes (Signed)
Patient on schedule for Port placement 10/09/2021, called and spoke with patient who lives at [facility], plan to be here @ 1230, NPO after 0630, driver post procedure/recovery/discharge. States she plans to have someone stay with her after discharge home post procedure.stated understanding.

## 2021-10-08 ENCOUNTER — Other Ambulatory Visit: Payer: Self-pay | Admitting: Radiology

## 2021-10-08 ENCOUNTER — Encounter: Payer: Self-pay | Admitting: Oncology

## 2021-10-09 ENCOUNTER — Other Ambulatory Visit: Payer: Self-pay

## 2021-10-09 ENCOUNTER — Encounter: Payer: Self-pay | Admitting: Radiology

## 2021-10-09 ENCOUNTER — Ambulatory Visit
Admission: RE | Admit: 2021-10-09 | Discharge: 2021-10-09 | Disposition: A | Discharge status: Home / Self Care | Payer: Medicare HMO | Source: Ambulatory Visit | Attending: Oncology | Admitting: Oncology

## 2021-10-09 ENCOUNTER — Ambulatory Visit (HOSPITAL_COMMUNITY): Payer: Medicare Other

## 2021-10-09 ENCOUNTER — Encounter: Payer: Self-pay | Admitting: Oncology

## 2021-10-09 DIAGNOSIS — C189 Malignant neoplasm of colon, unspecified: Secondary | ICD-10-CM | POA: Diagnosis present

## 2021-10-09 DIAGNOSIS — I10 Essential (primary) hypertension: Secondary | ICD-10-CM | POA: Insufficient documentation

## 2021-10-09 DIAGNOSIS — C787 Secondary malignant neoplasm of liver and intrahepatic bile duct: Secondary | ICD-10-CM | POA: Insufficient documentation

## 2021-10-09 DIAGNOSIS — F419 Anxiety disorder, unspecified: Secondary | ICD-10-CM | POA: Insufficient documentation

## 2021-10-09 DIAGNOSIS — E785 Hyperlipidemia, unspecified: Secondary | ICD-10-CM | POA: Insufficient documentation

## 2021-10-09 DIAGNOSIS — R569 Unspecified convulsions: Secondary | ICD-10-CM | POA: Diagnosis not present

## 2021-10-09 DIAGNOSIS — F32A Depression, unspecified: Secondary | ICD-10-CM | POA: Insufficient documentation

## 2021-10-09 HISTORY — PX: IR IMAGING GUIDED PORT INSERTION: IMG5740

## 2021-10-09 HISTORY — DX: Malignant neoplasm of colon, unspecified: C78.7

## 2021-10-09 HISTORY — DX: Malignant neoplasm of colon, unspecified: C18.9

## 2021-10-09 IMAGING — US IR IMAGING GUIDED PORT INSERTION
1 series · 1 of 1 positions shown · non-contrast
Comparison: none

INDICATION: History of colorectal cancer, metastatic to the liver

[Series 1: ir imaging guided port insertion · 0.03mm/px · 1 of 1 slices shown]
[im 1/1]
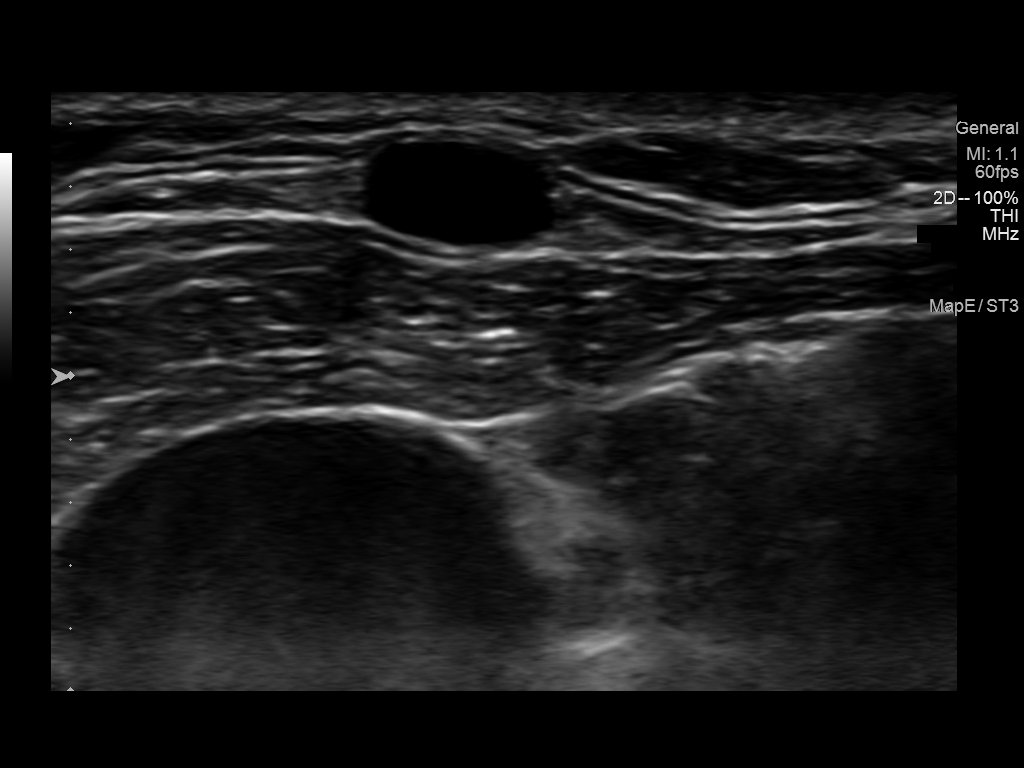

[1 of 1 positions shown; findings below may reference images not displayed]

EXAM:
IMPLANTED PORT A CATH PLACEMENT WITH ULTRASOUND AND FLUOROSCOPIC
GUIDANCE

MEDICATIONS:
None

ANESTHESIA/SEDATION:
Moderate (conscious) sedation was employed during this procedure. A
total of Versed 1 mg and Fentanyl 50 mcg was administered
intravenously.

Moderate Sedation Time: 19 minutes. The patient's level of
consciousness and vital signs were monitored continuously by
radiology nursing throughout the procedure under my direct
supervision.

FLUOROSCOPY TIME:  1.6 minutes (15.2 mGy)

COMPLICATIONS:
None immediate.

PROCEDURE:
Informed written consent was obtained from the patient after a
thorough discussion of the procedural risks, benefits and
alternatives. All questions were addressed. Maximal Sterile Barrier
Technique was utilized including caps, mask, sterile gowns, sterile
gloves, sterile drape, hand hygiene and skin antiseptic. A timeout
was performed prior to the initiation of the procedure.

The patient was placed supine on the exam table. The right neck and
chest was prepped and draped in the standard sterile fashion. A
preliminary ultrasound of the right neck was performed and
demonstrates a patent right internal jugular vein. A permanent
ultrasound image was stored in the electronic medical record. The
overlying skin was anesthetized with 1% Lidocaine. Using ultrasound
guidance, access was obtained into the right internal jugular vein
using a 21 gauge micropuncture set. A wire was advanced into the
SVC, a short incision was made at the puncture site, and serial
dilatation performed. Next, in an ipsilateral infraclavicular
location, an incision was made at the site of the subcutaneous
reservoir. Blunt dissection was used to open a pocket to contain the
reservoir. A subcutaneous tunnel was then created from the port site
to the puncture site. A(n) 8 Fr single lumen catheter was advanced
through the tunnel. The catheter was attached to the port and this
was placed in the subcutaneous pocket. Under fluoroscopic guidance,
a peel away sheath was placed, and the catheter was trimmed to the
appropriate length and was advanced into the central veins. The
catheter length is 27 cm. The tip of the catheter lies near the
superior cavoatrial junction. The port flushes and aspirates
appropriately. The port was flushed and locked with heparinized
saline. The port pocket was closed in 2 layers using 3-0 and 4-0
Vicryl/absorbable suture. Dermabond was also applied to both
incisions. The patient tolerated the procedure well and was
transferred to recovery in stable condition.
IMPRESSION: Successful placement of a left chest port via the left internal
jugular vein. The port is ready for immediate use.

## 2021-10-09 IMAGING — XA IR IMAGING GUIDED PORT INSERTION
1 series · 5 of 5 positions shown · non-contrast
Comparison: none

INDICATION: History of colorectal cancer, metastatic to the liver

[Series 2: interv standard · 3 acquisitions, 5 frames shown]
[im 1/3]
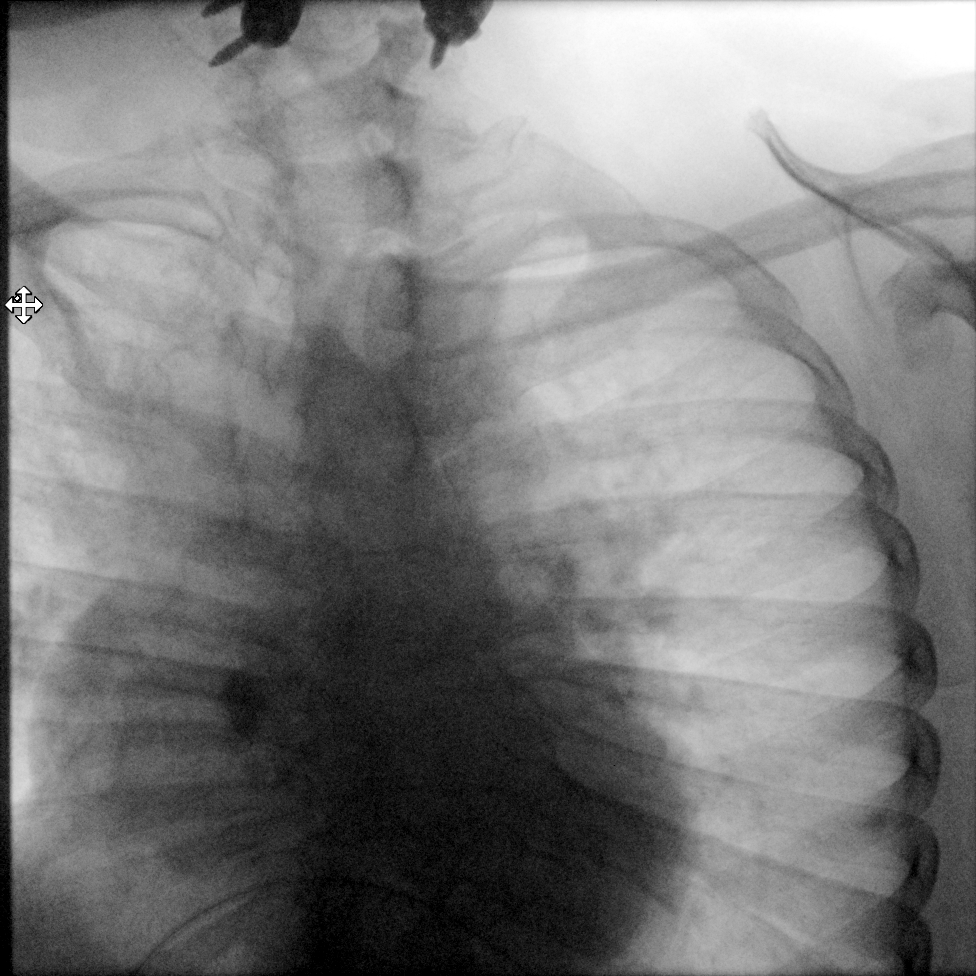
[im 2/3]
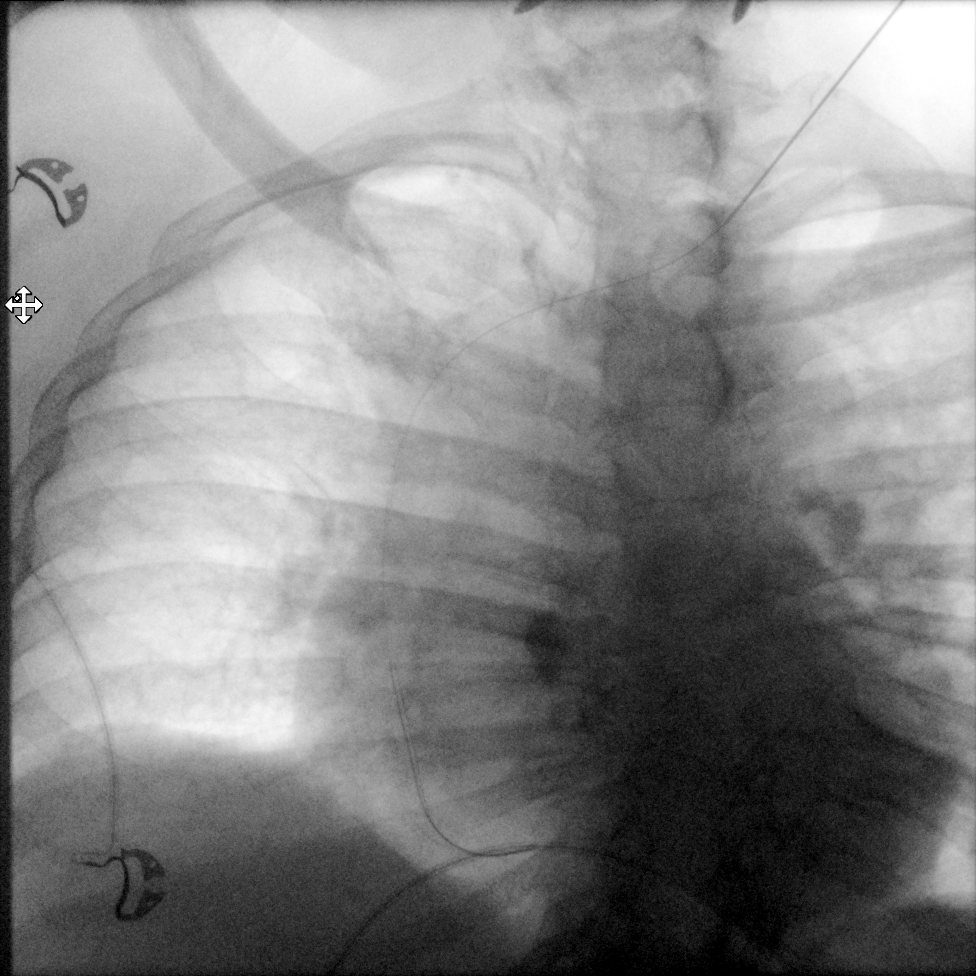
[im 3/3]
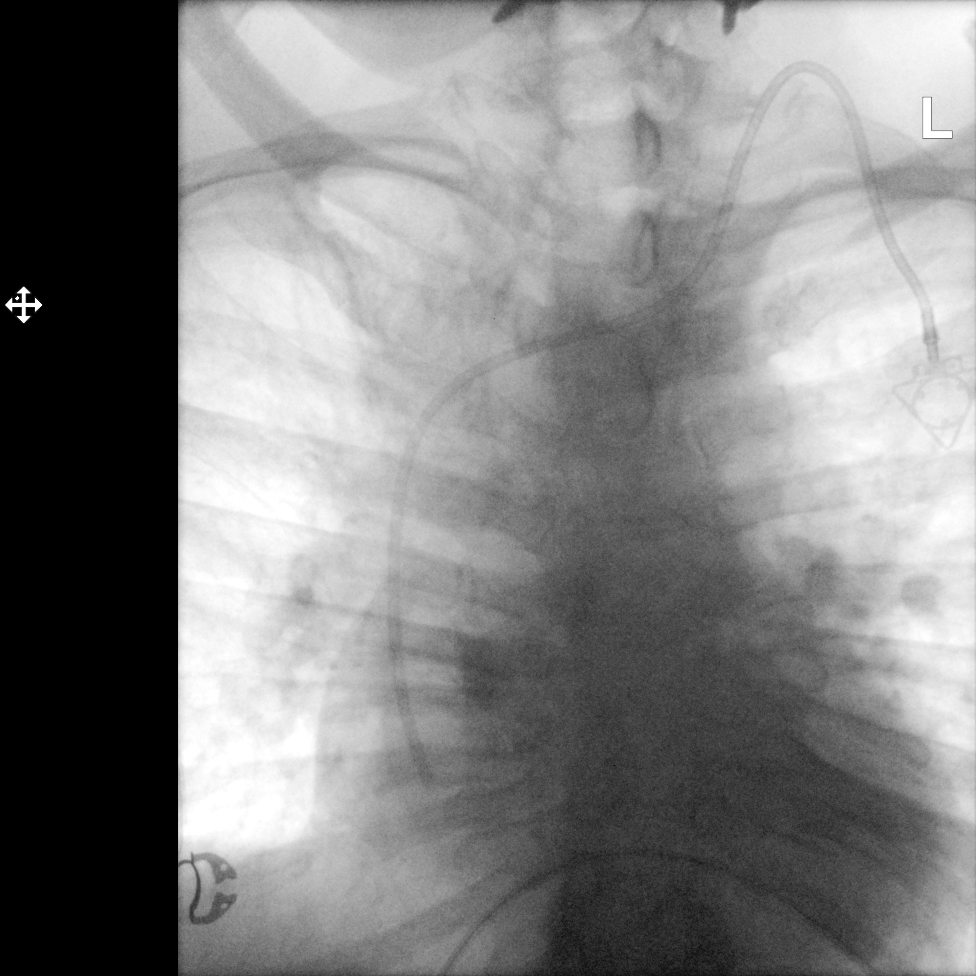
[im 3/3]
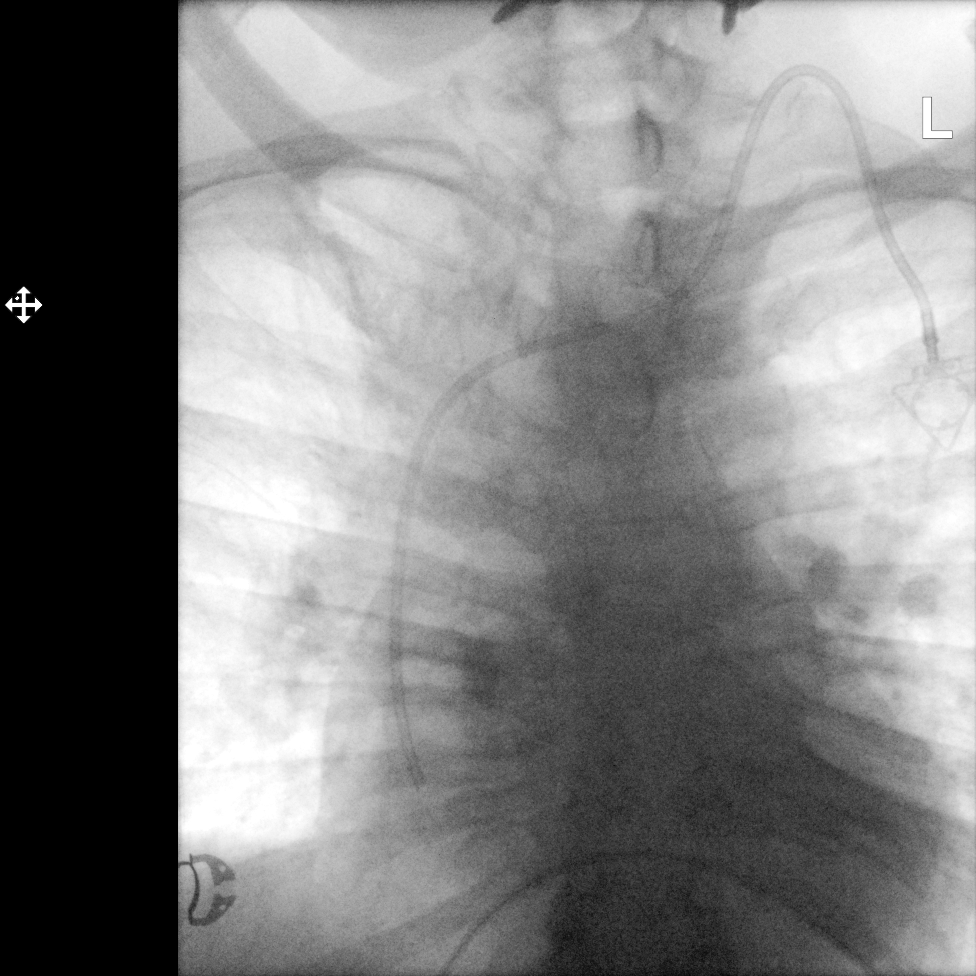
[im 3/3]
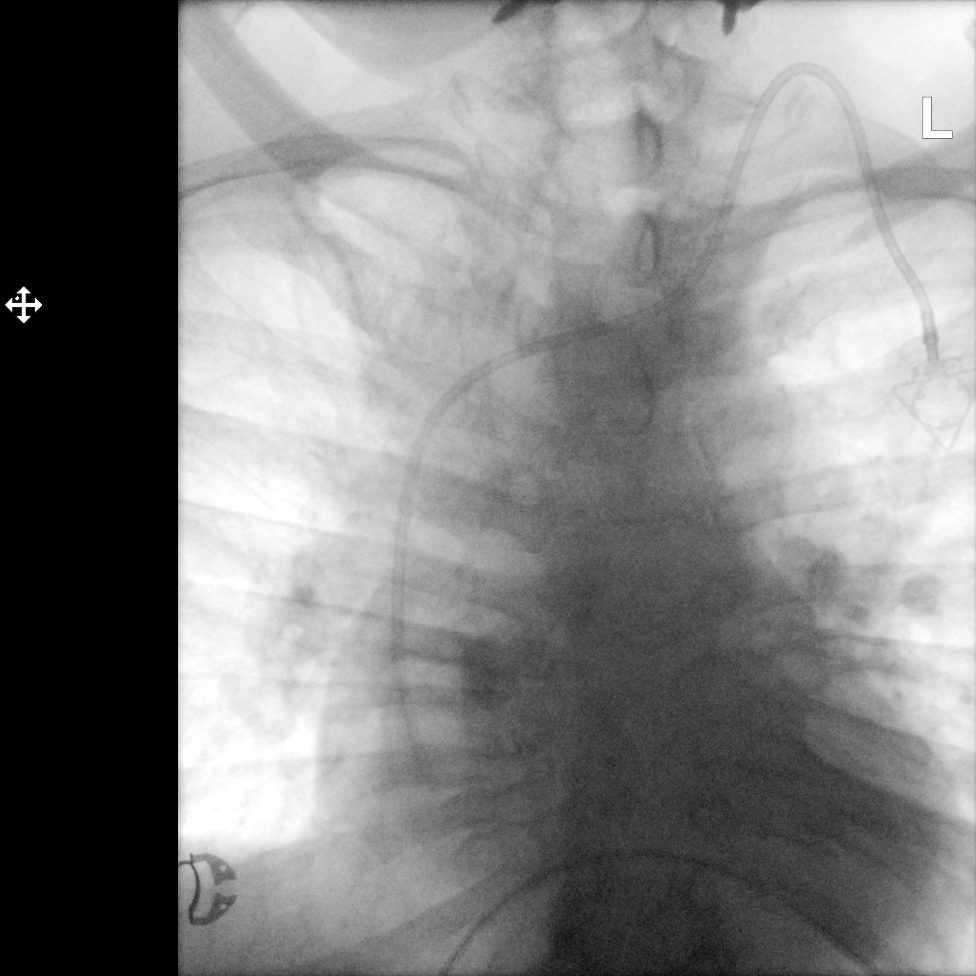

[5 of 5 positions shown; findings below may reference images not displayed]

EXAM:
IMPLANTED PORT A CATH PLACEMENT WITH ULTRASOUND AND FLUOROSCOPIC
GUIDANCE

MEDICATIONS:
None

ANESTHESIA/SEDATION:
Moderate (conscious) sedation was employed during this procedure. A
total of Versed 1 mg and Fentanyl 50 mcg was administered
intravenously.

Moderate Sedation Time: 19 minutes. The patient's level of
consciousness and vital signs were monitored continuously by
radiology nursing throughout the procedure under my direct
supervision.

FLUOROSCOPY TIME:  1.6 minutes (15.2 mGy)

COMPLICATIONS:
None immediate.

PROCEDURE:
Informed written consent was obtained from the patient after a
thorough discussion of the procedural risks, benefits and
alternatives. All questions were addressed. Maximal Sterile Barrier
Technique was utilized including caps, mask, sterile gowns, sterile
gloves, sterile drape, hand hygiene and skin antiseptic. A timeout
was performed prior to the initiation of the procedure.

The patient was placed supine on the exam table. The right neck and
chest was prepped and draped in the standard sterile fashion. A
preliminary ultrasound of the right neck was performed and
demonstrates a patent right internal jugular vein. A permanent
ultrasound image was stored in the electronic medical record. The
overlying skin was anesthetized with 1% Lidocaine. Using ultrasound
guidance, access was obtained into the right internal jugular vein
using a 21 gauge micropuncture set. A wire was advanced into the
SVC, a short incision was made at the puncture site, and serial
dilatation performed. Next, in an ipsilateral infraclavicular
location, an incision was made at the site of the subcutaneous
reservoir. Blunt dissection was used to open a pocket to contain the
reservoir. A subcutaneous tunnel was then created from the port site
to the puncture site. A(n) 8 Fr single lumen catheter was advanced
through the tunnel. The catheter was attached to the port and this
was placed in the subcutaneous pocket. Under fluoroscopic guidance,
a peel away sheath was placed, and the catheter was trimmed to the
appropriate length and was advanced into the central veins. The
catheter length is 27 cm. The tip of the catheter lies near the
superior cavoatrial junction. The port flushes and aspirates
appropriately. The port was flushed and locked with heparinized
saline. The port pocket was closed in 2 layers using 3-0 and 4-0
Vicryl/absorbable suture. Dermabond was also applied to both
incisions. The patient tolerated the procedure well and was
transferred to recovery in stable condition.
IMPRESSION: Successful placement of a left chest port via the left internal
jugular vein. The port is ready for immediate use.

## 2021-10-09 MED ORDER — FENTANYL CITRATE (PF) 100 MCG/2ML IJ SOLN
INTRAMUSCULAR | Status: AC | PRN
  Administered 2021-10-09 (×2): 25 ug via INTRAVENOUS

## 2021-10-09 MED ORDER — HEPARIN SOD (PORK) LOCK FLUSH 100 UNIT/ML IV SOLN
INTRAVENOUS | Status: AC
  Administered 2021-10-09: 500 [IU]
  Filled 2021-10-09: qty 5

## 2021-10-09 MED ORDER — MIDAZOLAM HCL 2 MG/2ML IJ SOLN
INTRAMUSCULAR | Status: AC | PRN
Start: 2021-10-09 — End: 2021-10-09
  Administered 2021-10-09: 1 mg via INTRAVENOUS

## 2021-10-09 MED ORDER — FENTANYL CITRATE (PF) 100 MCG/2ML IJ SOLN
INTRAMUSCULAR | Status: AC
  Filled 2021-10-09: qty 2

## 2021-10-09 MED ORDER — SODIUM CHLORIDE 0.9 % IV SOLN
INTRAVENOUS | Status: DC
  Filled 2021-10-09: qty 1000

## 2021-10-09 MED ORDER — MIDAZOLAM HCL 2 MG/2ML IJ SOLN
INTRAMUSCULAR | Status: AC
  Filled 2021-10-09: qty 2

## 2021-10-09 MED ORDER — LIDOCAINE-EPINEPHRINE 1 %-1:100000 IJ SOLN
INTRAMUSCULAR | Status: AC
  Administered 2021-10-09: 10 mL
  Filled 2021-10-09: qty 1

## 2021-10-09 NOTE — Procedures (Signed)
Interventional Radiology Procedure Note  Date of Procedure: 10/09/2021  Procedure: Port placement   Findings:  1. Left port placement via left IJ   Complications: No immediate complications noted.   Estimated Blood Loss: minimal  Follow-up and Recommendations: 1. Ready for use    Albin Felling, MD  Vascular & Interventional Radiology  10/09/2021 3:32 PM

## 2021-10-09 NOTE — H&P (Signed)
Chief Complaint: Patient was seen in consultation today for image guided portacatheter placement at the request of Yu,Zhou  Referring Physician(s): Yu,Zhou  Supervising Physician: Juliet Rude  Patient Status: [facility] - Out-pt  History of Present Illness: Chelsea [name] is a 76 y.o. female with PMH of anxiety, depression, HLD, HTN, liver mass, metastatic colon cancer and seizures.  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  Patient had liver lesion biopsy on 09/04/2021 that showed metastatic adenocarcinoma.  Patient was referred today by Dr. Tasia Catchings for Port-A-Cath placement to begin chemotherapy.  Past Medical History:  Diagnosis Date   Anxiety    Arthritis    Depression    Hyperlipidemia    Hypertension    Liver mass    Port-A-Cath in place    Seizures Chelsea [name])    Spinal stenosis    Ulcer    Urinary incontinence     Past Surgical History:  Procedure Laterality Date   ABDOMINAL HYSTERECTOMY     BACK SURGERY     due to polio   BREAST BIOPSY Bilateral    neg   BREAST BIOPSY Right 2011   neg/stereo   CARPAL TUNNEL RELEASE     CATARACT EXTRACTION Right 2020   COLONOSCOPY WITH PROPOFOL N/A 04/04/2021   Procedure: COLONOSCOPY WITH PROPOFOL;  Surgeon: Virgel Manifold, MD;  Location: [facility] ENDOSCOPY;  Service: Endoscopy;  Laterality: N/A;   EYE SURGERY      Allergies: Penicillins  Medications: Prior to Admission medications   Medication Sig Start Date End Date Taking? Authorizing Provider  acetaminophen (TYLENOL) 500 MG tablet Take 500 mg by mouth every 6 (six) hours as needed.    [provider]  ALPRAZolam Duanne Moron) 0.5 MG tablet TAKE 1 TABLET BY MOUTH TWICE A DAY AS NEEDED FOR ANXIETY 10/03/21   Jearld Fenton, NP  aspirin 325 MG tablet Take 325 mg by mouth daily.    [provider]  busPIRone (BUSPAR) 5 MG tablet Take 1 tablet (5 mg total) by mouth 3 (three)  times daily. 04/11/21   Jearld Fenton, NP  Cyanocobalamin (VITAMIN B 12 PO) Take 500 mcg by mouth daily.    [provider]  diclofenac Sodium (VOLTAREN) 1 % GEL Apply 4 g topically 4 (four) times daily. 12/17/20   Kathrine Haddock, NP  FLUoxetine (PROZAC) 20 MG capsule TAKE 3 CAPSULES (60 MG TOTAL) BY MOUTH EVERY MORNING. 08/30/21   Jearld Fenton, NP  gabapentin (NEURONTIN) 100 MG capsule TAKE 2 CAPSULES BY MOUTH 2 TIMES DAILY. 07/02/21   Karamalegos, Devonne Doughty, DO  lidocaine (LIDODERM) 5 % Place 1 patch onto the skin daily. Remove & Discard patch within 12 hours or as directed by MD 10/24/20   Max Sane, MD  lidocaine-prilocaine (EMLA) cream Apply to affected area once 09/25/21   [name], MD  meclizine (ANTIVERT) 12.5 MG tablet Take 1 tablet (12.5 mg total) by mouth 3 (three) times daily as needed for dizziness. Patient not taking: Reported on 09/11/2021 07/08/21   Jearld Fenton, NP  methocarbamol (ROBAXIN) 500 MG tablet Take 500 mg by mouth every 6 (six) hours as needed for muscle spasms. 1-2 tabs every 6 hours PRN. Patient not taking: Reported on 08/25/2021    [provider]  nitrofurantoin, macrocrystal-monohydrate, (MACROBID) 100 MG capsule Take 1 capsule (100 mg total) by mouth every 12 (twelve) hours. Patient not taking: Reported on 09/25/2021 09/02/21   MacDiarmid,  Nicki Reaper, MD  ondansetron (ZOFRAN) 8 MG tablet Take 1 tablet (8 mg total) by mouth 2 (two) times daily as needed (Nausea or vomiting). 09/25/21   [name], MD  polyethylene glycol powder (GLYCOLAX/MIRALAX) 17 GM/SCOOP powder Take 17 g by mouth daily. 01/28/21   Jearld Fenton, NP  prochlorperazine (COMPAZINE) 10 MG tablet Take 1 tablet (10 mg total) by mouth every 6 (six) hours as needed (Nausea or vomiting). 09/25/21   [name], MD  triamterene-hydrochlorothiazide (MAXZIDE-25) 37.5-25 MG tablet Take 1 tablet by mouth daily. 07/27/21   [provider]  vitamin E 180 MG (400 UNITS) capsule Take 400 Units  by mouth daily.    [provider]     Family History  Problem Relation Age of Onset   Arthritis Mother    Heart disease Mother    Stroke Mother    Hypertension Mother    COPD Mother    Sudden death Sister    Other Father        unknown medical history   Breast cancer Neg Hx     Social History   Socioeconomic History   Marital status: Divorced    Spouse name: Not on file   Number of children: Not on file   Years of education: Not on file   Highest education level: Not on file  Occupational History   Not on file  Tobacco Use   Smoking status: Never   Smokeless tobacco: Never  Vaping Use   Vaping Use: Never used  Substance and Sexual Activity   Alcohol use: Never   Drug use: No   Sexual activity: Not on file  Other Topics Concern   Not on file  Social History Narrative   Not on file   Social Determinants of Health   Financial Resource Strain: Not on file  Food Insecurity: Not on file  Transportation Needs: Not on file  Physical Activity: Not on file  Stress: Not on file  Social Connections: Not on file     Review of Systems: A 12 point ROS discussed and pertinent positives are indicated in the HPI above.  All other systems are negative.  Review of Systems  Constitutional:  Negative for appetite change, chills and fever.  Eyes:  Negative for visual disturbance.  Respiratory:  Negative for cough and shortness of breath.   Cardiovascular:  Positive for leg swelling. Negative for chest pain.  Gastrointestinal:  Positive for constipation. Negative for abdominal pain, nausea and vomiting.  Musculoskeletal:  Positive for back pain and neck pain.  Neurological:  Negative for dizziness, light-headedness and headaches.   Vital Signs: There were no vitals taken for this visit.  Physical Exam Constitutional:      Appearance: Normal appearance. She is not ill-appearing.  HENT:     Head: Normocephalic and atraumatic.     Mouth/Throat:     Mouth: Mucous  membranes are moist.     Pharynx: Oropharynx is clear.  Cardiovascular:     Rate and Rhythm: Normal rate and regular rhythm.     Pulses: Normal pulses.     Heart sounds: Normal heart sounds. No murmur heard.   No friction rub. No gallop.  Pulmonary:     Effort: Pulmonary effort is normal. No respiratory distress.     Breath sounds: Normal breath sounds. No stridor. No wheezing, rhonchi or rales.  Abdominal:     General: Bowel sounds are normal. There is no distension.     Palpations: Abdomen is soft.  Tenderness: There is no abdominal tenderness. There is no guarding.  Skin:    General: Skin is warm and dry.  Neurological:     Mental Status: She is alert and oriented to person, place, and time.  Psychiatric:        Mood and Affect: Mood normal.        Behavior: Behavior normal.        Thought Content: Thought content normal.        Judgment: Judgment normal.    Imaging: CT CHEST WO CONTRAST  Result Date: 09/23/2021 CLINICAL DATA:  Colon mass EXAM: CT CHEST WITHOUT CONTRAST TECHNIQUE: Multidetector CT imaging of the chest was performed following the standard protocol without IV contrast. COMPARISON:  CT abdomen and pelvis dated August 21, 2021 FINDINGS: Cardiovascular: No pericardial effusion. No significant coronary artery calcifications. Atherosclerotic disease of the thoracic aorta. Mediastinum/Nodes: Calcified lymph nodes of the mediastinum and hilum. No pathologically enlarged lymph nodes seen in the chest. Lungs/Pleura: Central airways are patent. Calcified nodules of the right middle lobe and left lower lobe. Ground-glass opacities of the right upper lobe, lingula and left lower lobe with associated linear opacities that likely represent atelectasis or scarring. Nonspecific small solid pulmonary nodules. Largest is located in the right upper lobe and measures 4 mm on series 2, image 27. Upper Abdomen: New heterogeneous lesion of the left lobe of the liver measuring 5.2 x 4.1  cm. Additional scattered subtle low-attenuation lesions are seen which are similar to slightly increased in size when compared with prior exam. Parenchymal calcifications seen in the bilateral kidneys. No acute abnormality. 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 No aggressive appearing osseous lesions. IMPRESSION: 1. No definite evidence of metastatic disease in the chest. 2. Nonspecific small solid pulmonary nodules. Largest is located in the right upper lobe and measures 4 mm. Recommend attention on follow-up. 3. Ground-glass opacities of the right upper lobe, lingula and left lower lobe with associated linear opacities that likely represent atelectasis or scarring. Findings are likely infectious or inflammatory. Recommend attention on follow-up. 4. New heterogeneous lesion of the left lobe of the liver measuring up to 5.2 cm, likely a hematoma related to recent biopsy. Contrast-enhanced MRI of the liver could be performed if further evaluation is desired. 5. Additional scattered subtle low-attenuation liver lesions are similar to slightly increased in size when compared with prior exam, evaluation is limited due to lack of IV contrast. 6. Aortic Atherosclerosis (ICD10-I70.0). Electronically Signed   By: Yetta Glassman M.D.   On: 09/23/2021 10:02    Labs:  CBC: Recent Labs    03/23/21 1916 06/17/21 1008 09/04/21 0759 09/15/21 1626  WBC 4.8 4.1 4.4 5.3  HGB 11.7* 11.1* 11.6* 11.1*  HCT 36.6 34.3* 37.5 36.0  PLT 237 212 207 238    COAGS: Recent Labs    09/04/21 0759  INR 1.0    BMP: Recent Labs    02/12/21 0209 03/23/21 1916 06/17/21 1008 08/19/21 1526 09/15/21 1626  NA 139 137 138  --  139  K 3.9 3.8 3.3*  --  3.5  CL 99 101 101  --  104  CO2 33* 28 27  --  28  GLUCOSE 104* 103* 97  --  89  BUN 19 19 14 14 14   CALCIUM 9.6 9.5 9.1  --  8.7*  CREATININE 0.93 0.99 0.77 0.91 0.84  GFRNONAA >60 59*  >60 >60 >60    LIVER FUNCTION  TESTS: Recent Labs    10/28/20 1409 02/12/21 0209 03/23/21 1916 09/15/21 1626  BILITOT 0.8 0.5 0.7 0.7  AST 27 21 15 25   ALT 29 17 12 20   ALKPHOS 70 80 69 84  PROT 6.7 7.5 7.9 7.5  ALBUMIN 3.3* 3.7 3.8 3.7    TUMOR MARKERS: No results for input(s): AFPTM, CEA, CA199, CHROMGRNA in the last 8760 hours.  Assessment and Plan: History of anxiety, depression, HLD, HTN, liver mass, metastatic colon cancer and seizures.  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  Patient had liver lesion biopsy on 09/04/2021 that showed metastatic adenocarcinoma.  Patient was referred today by Dr. Tasia Catchings for Port-A-Cath placement to begin chemotherapy.  Pt resting on stretcher with daughter at bedside. Pt is A&O, calm and pleasant. She is in no distress. Pt states she is NPO per order. No labs needed today.  Dr. Denna Haggard at bedside to discuss and answer questions about procedure.    Risks and benefits of image guided port-a-catheter placement was discussed with the patient including, but not limited to bleeding, infection, pneumothorax, or fibrin sheath development and need for additional procedures.  All of the patient's questions were answered, patient is agreeable to proceed. Consent signed and in chart.   Thank you for this interesting consult.  I greatly enjoyed meeting JANETE [name] and look forward to participating in their care.  A copy of this report was sent to the requesting provider on this date.  Electronically Signed: Tyson Alias, NP 10/09/2021, 8:27 AM   I spent a total of 20 minutes in face to face in clinical consultation, greater than 50% of which was counseling/coordinating care for image guided portacatheter placement.

## 2021-10-14 ENCOUNTER — Other Ambulatory Visit: Payer: Self-pay | Admitting: *Deleted

## 2021-10-14 ENCOUNTER — Encounter: Payer: Self-pay | Admitting: Oncology

## 2021-10-14 DIAGNOSIS — C189 Malignant neoplasm of colon, unspecified: Secondary | ICD-10-CM

## 2021-10-14 MED ORDER — LIDOCAINE-PRILOCAINE 2.5-2.5 % EX CREA: TOPICAL_CREAM | CUTANEOUS | 3 refills | Status: DC

## 2021-10-15 ENCOUNTER — Inpatient Hospital Stay: Payer: Medicare HMO

## 2021-10-15 ENCOUNTER — Other Ambulatory Visit: Payer: Self-pay

## 2021-10-15 ENCOUNTER — Encounter: Payer: Self-pay | Admitting: Oncology

## 2021-10-15 ENCOUNTER — Inpatient Hospital Stay (HOSPITAL_BASED_OUTPATIENT_CLINIC_OR_DEPARTMENT_OTHER): Payer: Medicare HMO | Admitting: Hospice and Palliative Medicine

## 2021-10-15 ENCOUNTER — Telehealth: Payer: Self-pay | Admitting: Urology

## 2021-10-15 ENCOUNTER — Ambulatory Visit: Payer: Self-pay

## 2021-10-15 ENCOUNTER — Inpatient Hospital Stay (HOSPITAL_BASED_OUTPATIENT_CLINIC_OR_DEPARTMENT_OTHER): Payer: Medicare HMO | Admitting: Oncology

## 2021-10-15 ENCOUNTER — Other Ambulatory Visit: Payer: Medicare Other

## 2021-10-15 VITALS — BP 128/64 | HR 72 | Temp 96.0°F | Resp 18 | Wt 178.0 lb

## 2021-10-15 DIAGNOSIS — Z515 Encounter for palliative care: Secondary | ICD-10-CM | POA: Diagnosis not present

## 2021-10-15 DIAGNOSIS — K8020 Calculus of gallbladder without cholecystitis without obstruction: Secondary | ICD-10-CM | POA: Diagnosis not present

## 2021-10-15 DIAGNOSIS — C787 Secondary malignant neoplasm of liver and intrahepatic bile duct: Secondary | ICD-10-CM

## 2021-10-15 DIAGNOSIS — C189 Malignant neoplasm of colon, unspecified: Secondary | ICD-10-CM | POA: Diagnosis not present

## 2021-10-15 DIAGNOSIS — N898 Other specified noninflammatory disorders of vagina: Secondary | ICD-10-CM | POA: Diagnosis not present

## 2021-10-15 DIAGNOSIS — E876 Hypokalemia: Secondary | ICD-10-CM

## 2021-10-15 DIAGNOSIS — Z79899 Other long term (current) drug therapy: Secondary | ICD-10-CM | POA: Diagnosis not present

## 2021-10-15 DIAGNOSIS — Z5112 Encounter for antineoplastic immunotherapy: Secondary | ICD-10-CM | POA: Diagnosis present

## 2021-10-15 DIAGNOSIS — R350 Frequency of micturition: Secondary | ICD-10-CM | POA: Diagnosis not present

## 2021-10-15 DIAGNOSIS — Z7189 Other specified counseling: Secondary | ICD-10-CM

## 2021-10-15 DIAGNOSIS — Z5111 Encounter for antineoplastic chemotherapy: Secondary | ICD-10-CM | POA: Diagnosis present

## 2021-10-15 DIAGNOSIS — D701 Agranulocytosis secondary to cancer chemotherapy: Secondary | ICD-10-CM | POA: Diagnosis not present

## 2021-10-15 DIAGNOSIS — R531 Weakness: Secondary | ICD-10-CM | POA: Diagnosis not present

## 2021-10-15 DIAGNOSIS — D6481 Anemia due to antineoplastic chemotherapy: Secondary | ICD-10-CM | POA: Diagnosis not present

## 2021-10-15 DIAGNOSIS — R300 Dysuria: Secondary | ICD-10-CM

## 2021-10-15 DIAGNOSIS — C180 Malignant neoplasm of cecum: Secondary | ICD-10-CM | POA: Diagnosis not present

## 2021-10-15 LAB — COMPREHENSIVE METABOLIC PANEL
ALT: 17 U/L (ref 0–44)
AST: 24 U/L (ref 15–41)
Albumin: 3.6 g/dL (ref 3.5–5.0)
Alkaline Phosphatase: 101 U/L (ref 38–126)
Anion gap: 5 (ref 5–15)
BUN: 14 mg/dL (ref 8–23)
CO2: 29 mmol/L (ref 22–32)
Calcium: 8.9 mg/dL (ref 8.9–10.3)
Chloride: 102 mmol/L (ref 98–111)
Creatinine, Ser: 0.75 mg/dL (ref 0.44–1.00)
GFR, Estimated: >60 mL/min (ref >60)
Glucose, Bld: 131 mg/dL — ABNORMAL HIGH (ref 70–99)
Potassium: 3.1 mmol/L — ABNORMAL LOW (ref 3.5–5.1)
Sodium: 136 mmol/L (ref 135–145)
Total Bilirubin: 0.4 mg/dL (ref 0.3–1.2)
Total Protein: 7.3 g/dL (ref 6.5–8.1)

## 2021-10-15 LAB — URINALYSIS, COMPLETE (UACMP) WITH MICROSCOPIC
Bilirubin Urine: NEGATIVE
Glucose, UA: NEGATIVE mg/dL
Hgb urine dipstick: NEGATIVE
Ketones, ur: NEGATIVE mg/dL
Leukocytes,Ua: NEGATIVE
Nitrite: NEGATIVE
Protein, ur: NEGATIVE mg/dL
Specific Gravity, Urine: 1.013 (ref 1.005–1.030)
pH: 6 (ref 5.0–8.0)

## 2021-10-15 LAB — CBC WITH DIFFERENTIAL/PLATELET
Abs Immature Granulocytes: 0.01 10*3/uL (ref 0.00–0.07)
Basophils Absolute: 0 10*3/uL (ref 0.0–0.1)
Basophils Relative: 1 %
Eosinophils Absolute: 0.2 10*3/uL (ref 0.0–0.5)
Eosinophils Relative: 4 %
HCT: 35.6 % — ABNORMAL LOW (ref 36.0–46.0)
Hemoglobin: 11.1 g/dL — ABNORMAL LOW (ref 12.0–15.0)
Immature Granulocytes: 0 %
Lymphocytes Relative: 48 %
Lymphs Abs: 1.8 10*3/uL (ref 0.7–4.0)
MCH: 29.7 pg (ref 26.0–34.0)
MCHC: 31.2 g/dL (ref 30.0–36.0)
MCV: 95.2 fL (ref 80.0–100.0)
Monocytes Absolute: 0.4 10*3/uL (ref 0.1–1.0)
Monocytes Relative: 10 %
Neutro Abs: 1.3 10*3/uL — ABNORMAL LOW (ref 1.7–7.7)
Neutrophils Relative %: 37 %
Platelets: 209 10*3/uL (ref 150–400)
RBC: 3.74 MIL/uL — ABNORMAL LOW (ref 3.87–5.11)
RDW: 14 % (ref 11.5–15.5)
WBC: 3.6 10*3/uL — ABNORMAL LOW (ref 4.0–10.5)
nRBC: 0 % (ref 0.0–0.2)

## 2021-10-15 LAB — PROTEIN, URINE, RANDOM: Total Protein, Urine: 6 mg/dL

## 2021-10-15 MED ORDER — POTASSIUM CHLORIDE 20 MEQ/100ML IV SOLN
20.0000 meq | Freq: Once | INTRAVENOUS | Status: AC
  Administered 2021-10-15: 20 meq via INTRAVENOUS

## 2021-10-15 MED ORDER — SODIUM CHLORIDE 0.9 % IV SOLN
Freq: Once | INTRAVENOUS | Status: AC
  Filled 2021-10-15: qty 250

## 2021-10-15 MED ORDER — FLUOROURACIL CHEMO INJECTION 2.5 GM/50ML
400.0000 mg/m2 | Freq: Once | INTRAVENOUS | Status: DC
  Filled 2021-10-15: qty 14

## 2021-10-15 MED ORDER — PROCHLORPERAZINE MALEATE 10 MG PO TABS
10.0000 mg | ORAL_TABLET | Freq: Once | ORAL | Status: AC
  Administered 2021-10-15: 10 mg via ORAL
  Filled 2021-10-15: qty 1

## 2021-10-15 MED ORDER — SODIUM CHLORIDE 0.9 % IV SOLN
700.0000 mg | Freq: Once | INTRAVENOUS | Status: DC
  Filled 2021-10-15: qty 35

## 2021-10-15 MED ORDER — POTASSIUM CHLORIDE CRYS ER 20 MEQ PO TBCR: 20.0000 meq | EXTENDED_RELEASE_TABLET | Freq: Every day | ORAL | 0 refills | Status: DC

## 2021-10-15 MED ORDER — SODIUM CHLORIDE 0.9 % IV SOLN
2400.0000 mg/m2 | INTRAVENOUS | Status: AC
  Filled 2021-10-15: qty 87

## 2021-10-15 MED ORDER — HEPARIN SOD (PORK) LOCK FLUSH 100 UNIT/ML IV SOLN
500.0000 [IU] | Freq: Once | INTRAVENOUS | Status: AC | PRN
  Administered 2021-10-15: 500 [IU]
  Filled 2021-10-15: qty 5

## 2021-10-15 NOTE — Progress Notes (Signed)
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 Past medical history of seizure, HTN, HLD, depression.   Patient starting 5-FU.    Met with patient during infusion.  Patient reports that her appetite is good.  She lives at [facility] and all meals are prepared for her if she wants to eat them.  She does have a microwave and toaster in her apartment and does prepare some foods in her apartment.  Says that she has a bowel movement about 1 time per week.  Planning to start miralax.  Noted that she has a aide that assists her as well.      Medications: miralax, compazine, zofran, vit b 12 and E  Labs: K 3.1, glucose 131  Anthropometrics:   Height: 4'9" Weight: 178 lb today Stable weight BMI: 38   Estimated Energy Needs  Kcals: 2025-2400 Protein: 101-120g Fluid: 2000-2400 ml  NUTRITION DIAGNOSIS: Food and nutrition related knowledge deficit related to cancer diagnosis as evidenced by patient with questions on foods to eat   INTERVENTION:  Discussed importance of good nutrition during treatment and weight maintenance. Discussed importance of eating good sources of protein.   Contact information provided    MONITORING, EVALUATION, GOAL: weight trends, intake   NEXT VISIT: to be determined with treatment  Tabithia Stroder B. Zenia Resides, [place], [place] Registered Dietitian [phone] (mobile)

## 2021-10-15 NOTE — Progress Notes (Signed)
Hematology/Oncology follow up note [phone] [phone]      Patient Care Team: Jearld Fenton, NP as PCP - General (Internal Medicine) Clent Jacks, RN as Oncology Nurse Navigator  REFERRING PROVIDER: Jearld Fenton, NP  CHIEF COMPLAINTS/REASON FOR VISIT:  metastatic colon cancer  HISTORY OF PRESENTING ILLNESS:   Chelsea [name] is a  76 y.o.  female with PMH listed below was seen in consultation at the request of  Jearld Fenton, NP  for evaluation of metastatic colon cancer.  Patient initially went to emergency room on 03/24/2021 for abdominal pain. 03/24/2021, CT scan of the abdomen showed marked bladder wall thickening with surrounding soft tissue stranding is noted.  Concerning for cystitis.  There is a lobulated mass between the posterior wall of the bladder and the rectum which appears to arise from the vaginal cuff.  This is indeterminate and difficult to characterize reflecting lack of IV contrast material.  Nonobstructing left renal calculus, left sacroiliitis, lumbar spondylosis aortic atherosclerosis. 03/24/2021 CT pelvic showed abdominal [place] soft tissue of right cecum, concerning for colon carcinoma.  Colonoscopy recommended.  Low-attenuation mass in the region of vaginal cuff is again noted.  Diffuse urinary bladder wall thickening and mild bilateral hydroureter possibly cystitis.  [place] 03/24/2021 Lobulated solid 3.8 cm mass confirmed by ultrasound. This is most compatible with a neoplasm but remains indeterminate as to the origin as the epicenter seems to be outside of both the vaginal cuff and the adjacent colon, while the lesion appears inseparable from both. Extensive echogenic debris within the distended urinary bladder.   03/26/2021, patient was seen by Dr. Theora Gianotti for evaluation of colonic/vaginal cuff pelvic mass.  Patient also was referred to gastroenterology for colonoscopy.  03/26/2021 CEA 3.4.  CA125 9.7 04/04/2021, status post  colonoscopy which showed a frond like /villous nonobstructing large mass was found in the cecum, this was biopsied.  Terminal ileum was briefly intubated and appeared normal.  Diverticulosis in the sigmoid colon.  The rectum, sigmoid colon, descending colon, transverse colon and ascending colon were normal.  Nonbleeding internal hemorrhoids. 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    Given that the biopsy may not be representative of the entire underlying lesion.  Patient was recommended to establish with surgeon for evaluation. Patient no showed for her surgery appointment and as well as her follow-up appointment with gastroenterology.  Per daughter, patient was having cervical spine surgery and she prioritized that over the colon surgery.  08/19/2021, patient establish care with surgery Dr. Christian Mate 08/21/2021, CT abdomen pelvis with contrast  showed soft tissue mass at the base of cecum measuring up to 5 cm, slightly increased in size.  Interval development of multiple low-density lesions throughout the liver highly suggestive for metastatic disease.  Complex mass in the region of the vaginal cuff measuring up to 5.2 cm, increased in size.  Right ovary also appears more prominent on the current examination.  Moderate large volume of stool throughout the colon.  Mild urinary bladder wall thickening.  Colonic diverticulosis without evidence of active diverticulitis.  Aortic atherosclerosis  09/04/2021, patient is status post left lobe liver lesion biopsy and pathology showed metastatic adenocarcinoma, compatible with colorectal primary.  She was referred to establish care with oncology. She was accompanied by her daughter today.  Patient walks with a walker.+ Night sweats + weight loss.  Denies any abdominal pain currently, melena, blood in the stool.  She is currently being followed by home health PT/OT following a  previous spinal surgery INTERVAL HISTORY Chelsea [name] is a 76 y.o. female who has above history reviewed by me today presents for follow up visit for management of metastatic colon cancer [name] for evaluation prior to chemotherapy. 10/10/2020, status post Mediport placement. Patient has also been to chemotherapy class. She has antiemetics at home. Today she denies nausea vomiting fever chills, abdominal pain. She has not given a sample for urine analysis of protein yet.  Review of Systems  Constitutional:  Positive for fatigue and unexpected weight change. Negative for appetite change, chills and fever.  HENT:   Negative for hearing loss and voice change.   Eyes:  Negative for eye problems.  Respiratory:  Negative for chest tightness and cough.   Cardiovascular:  Negative for chest pain.  Gastrointestinal:  Negative for abdominal distention, abdominal pain and blood in stool.  Endocrine: Negative for hot flashes.  Genitourinary:  Negative for difficulty urinating and frequency.   Musculoskeletal:  Positive for back pain. Negative for arthralgias.  Skin:  Negative for itching and rash.  Neurological:  Positive for numbness (chornic finger tips). Negative for extremity weakness.  Hematological:  Negative for adenopathy.  Psychiatric/Behavioral:  Negative for confusion.    MEDICAL HISTORY:  Past Medical History:  Diagnosis Date   Anxiety    Arthritis    Depression    Hyperlipidemia    Hypertension    Liver mass    Metastatic colon cancer to liver ([place])    Port-A-Cath in place    Seizures ([place])    Spinal stenosis    Ulcer    Urinary incontinence     SURGICAL HISTORY: Past Surgical History:  Procedure Laterality Date   ABDOMINAL HYSTERECTOMY     BACK SURGERY     due to polio   BREAST BIOPSY Bilateral    neg   BREAST BIOPSY Right 2011   neg/stereo   CARPAL TUNNEL RELEASE     CATARACT EXTRACTION Right 2020   COLONOSCOPY WITH PROPOFOL N/A 04/04/2021   Procedure: COLONOSCOPY WITH  PROPOFOL;  Surgeon: Virgel Manifold, MD;  Location: [facility] ENDOSCOPY;  Service: Endoscopy;  Laterality: N/A;   EYE SURGERY     IR IMAGING GUIDED PORT INSERTION  10/09/2021    SOCIAL HISTORY: Social History   Socioeconomic History   Marital status: Divorced    Spouse name: Not on file   Number of children: Not on file   Years of education: Not on file   Highest education level: Not on file  Occupational History   Not on file  Tobacco Use   Smoking status: Never   Smokeless tobacco: Never  Vaping Use   Vaping Use: Never used  Substance and Sexual Activity   Alcohol use: Never   Drug use: No   Sexual activity: Not on file  Other Topics Concern   Not on file  Social History Narrative   Not on file   Social Determinants of Health   Financial Resource Strain: Not on file  Food Insecurity: Not on file  Transportation Needs: Not on file  Physical Activity: Not on file  Stress: Not on file  Social Connections: Not on file  Intimate Partner Violence: Not on file    FAMILY HISTORY: Family History  Problem Relation Age of Onset   Arthritis Mother    Heart disease Mother    Stroke Mother    Hypertension Mother    COPD Mother    Sudden death Sister  Other Father        unknown medical history   Breast cancer Neg Hx     ALLERGIES:  is allergic to penicillins.  MEDICATIONS:  Current Outpatient Medications  Medication Sig Dispense Refill   acetaminophen (TYLENOL) 500 MG tablet Take 500 mg by mouth every 6 (six) hours as needed.     ALPRAZolam (XANAX) 0.5 MG tablet TAKE 1 TABLET BY MOUTH TWICE A DAY AS NEEDED FOR ANXIETY 60 tablet 0   aspirin 325 MG tablet Take 325 mg by mouth daily.     busPIRone (BUSPAR) 5 MG tablet Take 1 tablet (5 mg total) by mouth 3 (three) times daily. 270 tablet 1   Cyanocobalamin (VITAMIN B 12 PO) Take 500 mcg by mouth daily.     diclofenac Sodium (VOLTAREN) 1 % GEL Apply 4 g topically 4 (four) times daily. 4 g 0   FLUoxetine (PROZAC) 20 MG  capsule TAKE 3 CAPSULES (60 MG TOTAL) BY MOUTH EVERY MORNING. 270 capsule 0   gabapentin (NEURONTIN) 100 MG capsule TAKE 2 CAPSULES BY MOUTH 2 TIMES DAILY. 360 capsule 1   lidocaine (LIDODERM) 5 % Place 1 patch onto the skin daily. Remove & Discard patch within 12 hours or as directed by MD 1 patch 0   lidocaine-prilocaine (EMLA) cream Apply small amount of cream to port site 1-2 hour prior to chemo treatment. 30 g 3   polyethylene glycol powder (GLYCOLAX/MIRALAX) 17 GM/SCOOP powder Take 17 g by mouth daily. 3350 g 1   potassium chloride SA (KLOR-CON M) 20 MEQ tablet Take 1 tablet (20 mEq total) by mouth daily. 30 tablet 0   triamterene-hydrochlorothiazide (MAXZIDE-25) 37.5-25 MG tablet Take 1 tablet by mouth daily.     vitamin E 180 MG (400 UNITS) capsule Take 400 Units by mouth daily.     meclizine (ANTIVERT) 12.5 MG tablet Take 1 tablet (12.5 mg total) by mouth 3 (three) times daily as needed for dizziness. (Patient not taking: Reported on 09/11/2021) 30 tablet 0   methocarbamol (ROBAXIN) 500 MG tablet Take 500 mg by mouth every 6 (six) hours as needed for muscle spasms. 1-2 tabs every 6 hours PRN. (Patient not taking: Reported on 08/25/2021)     nitrofurantoin, macrocrystal-monohydrate, (MACROBID) 100 MG capsule Take 1 capsule (100 mg total) by mouth every 12 (twelve) hours. (Patient not taking: Reported on 09/25/2021) 14 capsule 0   ondansetron (ZOFRAN) 8 MG tablet Take 1 tablet (8 mg total) by mouth 2 (two) times daily as needed (Nausea or vomiting). (Patient not taking: Reported on 10/15/2021) 30 tablet 1   prochlorperazine (COMPAZINE) 10 MG tablet Take 1 tablet (10 mg total) by mouth every 6 (six) hours as needed (Nausea or vomiting). (Patient not taking: Reported on 10/15/2021) 30 tablet 1   No current facility-administered medications for this visit.   Facility-Administered Medications Ordered in Other Visits  Medication Dose Route Frequency Provider Last Rate Last Admin   fluorouracil  (ADRUCIL) 4,350 mg in sodium chloride 0.9 % 63 mL chemo infusion  2,400 mg/m2 (Treatment Plan Recorded) Intravenous 1 day or 1 dose [name], MD       fluorouracil (ADRUCIL) chemo injection 700 mg  400 mg/m2 (Treatment Plan Recorded) Intravenous Once [name], MD       leucovorin 700 mg in sodium chloride 0.9 % 250 mL infusion  700 mg Intravenous Once [name], MD         PHYSICAL EXAMINATION: ECOG PERFORMANCE STATUS: 2 - Symptomatic, <50% confined to bed  Vitals:   10/15/21 0848  BP: 128/64  Pulse: 72  Resp: 18  Temp: (!) 96 F (35.6 C)  SpO2: 99%   Filed Weights   10/15/21 0848  Weight: 178 lb (80.7 kg)    Physical Exam Constitutional:      General: She is not in acute distress.    Comments: Patient ambulates with a walker  HENT:     Head: Normocephalic and atraumatic.  Eyes:     General: No scleral icterus. Cardiovascular:     Rate and Rhythm: Normal rate and regular rhythm.     Heart sounds: Normal heart sounds.  Pulmonary:     Effort: Pulmonary effort is normal. No respiratory distress.     Breath sounds: No wheezing.  Abdominal:     General: Bowel sounds are normal. There is no distension.     Palpations: Abdomen is soft.  Musculoskeletal:        General: No deformity. Normal range of motion.     Cervical back: Normal range of motion and neck supple.  Skin:    General: Skin is warm and dry.     Findings: No erythema or rash.  Neurological:     Mental Status: She is alert and oriented to person, place, and time. Mental status is at baseline.     Cranial Nerves: No cranial nerve deficit.     Coordination: Coordination normal.  Psychiatric:        Mood and Affect: Mood normal.    LABORATORY DATA:  I have reviewed the data as listed Lab Results  Component Value Date   WBC 3.6 (L) 10/15/2021   HGB 11.1 (L) 10/15/2021   HCT 35.6 (L) 10/15/2021   MCV 95.2 10/15/2021   PLT 209 10/15/2021   Recent Labs    03/23/21 1916 06/17/21 1008 08/19/21 1526  09/15/21 1626 10/15/21 0820  NA 137 138  --  139 136  K 3.8 3.3*  --  3.5 3.1*  CL 101 101  --  104 102  CO2 28 27  --  28 29  GLUCOSE 103* 97  --  89 131*  BUN 19 14 14 14 14   CREATININE 0.99 0.77 0.91 0.84 0.75  CALCIUM 9.5 9.1  --  8.7* 8.9  GFRNONAA 59* >60 >60 >60 >60  PROT 7.9  --   --  7.5 7.3  ALBUMIN 3.8  --   --  3.7 3.6  AST 15  --   --  25 24  ALT 12  --   --  20 17  ALKPHOS 69  --   --  84 101  BILITOT 0.7  --   --  0.7 0.4    Iron/TIBC/Ferritin/ %Sat    Component Value Date/Time   FERRITIN 58 10/24/2020 0402       RADIOGRAPHIC STUDIES: I have personally reviewed the radiological images as listed and agreed with the findings in the report. CT CHEST WO CONTRAST  Result Date: 09/23/2021 CLINICAL DATA:  Colon mass EXAM: CT CHEST WITHOUT CONTRAST TECHNIQUE: Multidetector CT imaging of the chest was performed following the standard protocol without IV contrast. COMPARISON:  CT abdomen and pelvis dated August 21, 2021 FINDINGS: Cardiovascular: No pericardial effusion. No significant coronary artery calcifications. Atherosclerotic disease of the thoracic aorta. Mediastinum/Nodes: Calcified lymph nodes of the mediastinum and hilum. No pathologically enlarged lymph nodes seen in the chest. Lungs/Pleura: Central airways are patent. Calcified nodules of the right middle lobe and left lower lobe. Ground-glass opacities of  the right upper lobe, lingula and left lower lobe with associated linear opacities that likely represent atelectasis or scarring. Nonspecific small solid pulmonary nodules. Largest is located in the right upper lobe and measures 4 mm on series 2, image 27. Upper Abdomen: New heterogeneous lesion of the left lobe of the liver measuring 5.2 x 4.1 cm. Additional scattered subtle low-attenuation lesions are seen which are similar to slightly increased in size when compared with prior exam. Parenchymal calcifications seen in the bilateral kidneys. No acute  abnormality. 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 No aggressive appearing osseous lesions. IMPRESSION: 1. No definite evidence of metastatic disease in the chest. 2. Nonspecific small solid pulmonary nodules. Largest is located in the right upper lobe and measures 4 mm. Recommend attention on follow-up. 3. Ground-glass opacities of the right upper lobe, lingula and left lower lobe with associated linear opacities that likely represent atelectasis or scarring. Findings are likely infectious or inflammatory. Recommend attention on follow-up. 4. New heterogeneous lesion of the left lobe of the liver measuring up to 5.2 cm, likely a hematoma related to recent biopsy. Contrast-enhanced MRI of the liver could be performed if further evaluation is desired. 5. Additional scattered subtle low-attenuation liver lesions are similar to slightly increased in size when compared with prior exam, evaluation is limited due to lack of IV contrast. 6. Aortic Atherosclerosis (ICD10-I70.0). Electronically Signed   By: Yetta Glassman M.D.   On: 09/23/2021 10:02   IR IMAGING GUIDED PORT INSERTION  Result Date: 10/09/2021 INDICATION: History of colorectal cancer, metastatic to the liver EXAM: IMPLANTED PORT A CATH PLACEMENT WITH ULTRASOUND AND FLUOROSCOPIC GUIDANCE MEDICATIONS: None ANESTHESIA/SEDATION: Moderate (conscious) sedation was employed during this procedure. A total of Versed 1 mg and Fentanyl 50 mcg was administered intravenously. Moderate Sedation Time: 19 minutes. The patient's level of consciousness and vital signs were monitored continuously by radiology nursing throughout the procedure under my direct supervision. FLUOROSCOPY TIME:  1.6 minutes (16.3 mGy) COMPLICATIONS: None immediate. PROCEDURE: Informed written consent was obtained from the patient after a thorough discussion of the procedural risks, benefits and alternatives. All  questions were addressed. Maximal Sterile Barrier Technique was utilized including caps, mask, sterile gowns, sterile gloves, sterile drape, hand hygiene and skin antiseptic. A timeout was performed prior to the initiation of the procedure. The patient was placed supine on the exam table. The right neck and chest was prepped and draped in the standard sterile fashion. A preliminary ultrasound of the right neck was performed and demonstrates a patent right internal jugular vein. A permanent ultrasound image was stored in the electronic medical record. The overlying skin was anesthetized with 1% Lidocaine. Using ultrasound guidance, access was obtained into the right internal jugular vein using a 21 gauge micropuncture set. A wire was advanced into the SVC, a short incision was made at the puncture site, and serial dilatation performed. Next, in an ipsilateral infraclavicular location, an incision was made at the site of the subcutaneous reservoir. Blunt dissection was used to open a pocket to contain the reservoir. A subcutaneous tunnel was then created from the port site to the puncture site. A(n) 8 Fr single lumen catheter was advanced through the tunnel. The catheter was attached to the port and this was placed in the subcutaneous pocket. Under fluoroscopic guidance, a peel away sheath was placed, and the catheter was trimmed to the appropriate length and was advanced into the central veins. The catheter length is 27  cm. The tip of the catheter lies near the superior cavoatrial junction. The port flushes and aspirates appropriately. The port was flushed and locked with heparinized saline. The port pocket was closed in 2 layers using 3-0 and 4-0 Vicryl/absorbable suture. Dermabond was also applied to both incisions. The patient tolerated the procedure well and was transferred to recovery in stable condition. IMPRESSION: Successful placement of a left chest port via the left internal jugular vein. The port is ready  for immediate use. Electronically Signed   By: Albin Felling M.D.   On: 10/09/2021 16:00      ASSESSMENT & PLAN:  1. Metastatic colon cancer to liver ([place])   2. Goals of care, counseling/discussion   3. Encounter for antineoplastic chemotherapy   4. Urinary frequency   5. Vaginal mass   6. Hypokalemia    Cancer Staging  Metastatic colon cancer to liver [facility]) Staging form: Colon and Rectum - Neuroendocine Tumors, AJCC 8th Edition - Clinical stage from 09/16/2021: Stage IV (cTX, cNX, cM1) - Signed by [name], MD on 09/16/2021  #Metastatic colon cancer to liver, 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  No sufficient tissue for NGS Labs reviewed and discussed with patient. We reviewed the detailed instructions of antiemetics. The plan was to proceed with 5-FU/bevacizumab today. However patient was not able to give urine sample at infusion center.  Patient then reports symptoms of urinary frequency, urgency.  History of UTI and was treated back in November 2022. Chemotherapy was canceled due to possible UTI symptoms. Patient was eventually able to provide urine sample. Obtain UA and urinalysis.  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  Refer patient to reestablish care with gynecology oncology to see if this mass could be possibly biopsied. If a biopsy can be done, this may provide additional tissues for her colon cancer NGS study as well.  #Depression, she has psychi referredatry. #Hypokalemia, patient will receive IV potassium chloride 37meq x 1. Recommend patient to start potassium chloride 47meq daily.  Prescription sent to pharmacy.  Supportive care measures are necessary for patient well-being and will be provided as necessary.  Orders Placed This Encounter  Procedures   Urine Culture    Standing Status:   Future    Standing Expiration Date:   10/15/2022   Urinalysis, Complete w Microscopic    Standing Status:   Future    Standing Expiration  Date:   10/15/2022    All questions were answered. The patient knows to call the clinic with any problems questions or concerns.  cc Jearld Fenton, NP    [name], MD, PhD 10/15/2021

## 2021-10-15 NOTE — Telephone Encounter (Signed)
Patient called the office today to report that she went to the cancer center today for treatment and they advised her that she may have a UTI.  They collected urine and sent it out for culture.   She wanted to let Dr. Matilde Sprang know.  She will wait on the culture results and call us back if the cancer center advises her that she needs to be seen by urology.

## 2021-10-15 NOTE — Progress Notes (Signed)
Chelsea [name] at [facility] [phone] [phone]   Name: Chelsea [name] Date: 10/15/2021 MRN: [id number]  DOB: [birth year]  Patient Care Team: Jearld Fenton, NP as PCP - General (Internal Medicine) Clent Jacks, RN as Oncology Nurse Navigator    REASON FOR CONSULTATION: Chelsea [name] is a 76 y.o. 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  Palliative care was consulted to help address goals.  SOCIAL HISTORY:     reports that she has never smoked. She has never used smokeless tobacco. She reports that she does not drink alcohol and does not use drugs.  Patient lives at home alone at [facility].  She has a daughter who is involved in her care.  ADVANCE DIRECTIVES:  Not on file  CODE STATUS:   PAST MEDICAL HISTORY: Past Medical History:  Diagnosis Date   Anxiety    Arthritis    Depression    Hyperlipidemia    Hypertension    Liver mass    Metastatic colon cancer to liver ([place])    Port-A-Cath in place    Seizures ([place])    Spinal stenosis    Ulcer    Urinary incontinence     PAST SURGICAL HISTORY:  Past Surgical History:  Procedure Laterality Date   ABDOMINAL HYSTERECTOMY     BACK SURGERY     due to polio   BREAST BIOPSY Bilateral    neg   BREAST BIOPSY Right 2011   neg/stereo   CARPAL TUNNEL RELEASE     CATARACT EXTRACTION Right 2020   COLONOSCOPY WITH PROPOFOL N/A 04/04/2021   Procedure: COLONOSCOPY WITH PROPOFOL;  Surgeon: Virgel Manifold, MD;  Location: [facility] ENDOSCOPY;  Service: Endoscopy;  Laterality: N/A;   EYE SURGERY     IR IMAGING GUIDED PORT INSERTION  10/09/2021    HEMATOLOGY/ONCOLOGY HISTORY:  Oncology History  Metastatic colon cancer to liver ([place])  09/16/2021 Initial Diagnosis   Metastatic colon cancer to liver ([place])   09/16/2021 Cancer  Staging   Staging form: Colon and Rectum - Neuroendocine Tumors, AJCC 8th Edition - Clinical stage from 09/16/2021: Stage IV (cTX, cNX, cM1) - Signed by [name], MD on 09/16/2021 Stage prefix: Initial diagnosis   10/15/2021 -  Chemotherapy   Patient is on Treatment Plan : COLORECTAL 5FU / Leucovorin (Modified deGramont) + Bevacizumab q14d       ALLERGIES:  is allergic to penicillins.  MEDICATIONS:  Current Outpatient Medications  Medication Sig Dispense Refill   acetaminophen (TYLENOL) 500 MG tablet Take 500 mg by mouth every 6 (six) hours as needed.     ALPRAZolam (XANAX) 0.5 MG tablet TAKE 1 TABLET BY MOUTH TWICE A DAY AS NEEDED FOR ANXIETY 60 tablet 0   aspirin 325 MG tablet Take 325 mg by mouth daily.     busPIRone (BUSPAR) 5 MG tablet Take 1 tablet (5 mg total) by mouth 3 (three) times daily. 270 tablet 1   Cyanocobalamin (VITAMIN B 12 PO) Take 500 mcg by mouth daily.     diclofenac Sodium (VOLTAREN) 1 % GEL Apply 4 g topically 4 (four) times daily. 4 g 0   FLUoxetine (PROZAC) 20 MG capsule TAKE 3 CAPSULES (60 MG TOTAL) BY MOUTH EVERY MORNING. 270 capsule 0   gabapentin (NEURONTIN) 100 MG capsule TAKE 2 CAPSULES BY MOUTH 2 TIMES DAILY. 360 capsule 1   lidocaine (LIDODERM)  5 % Place 1 patch onto the skin daily. Remove & Discard patch within 12 hours or as directed by MD 1 patch 0   lidocaine-prilocaine (EMLA) cream Apply small amount of cream to port site 1-2 hour prior to chemo treatment. 30 g 3   meclizine (ANTIVERT) 12.5 MG tablet Take 1 tablet (12.5 mg total) by mouth 3 (three) times daily as needed for dizziness. (Patient not taking: Reported on 09/11/2021) 30 tablet 0   methocarbamol (ROBAXIN) 500 MG tablet Take 500 mg by mouth every 6 (six) hours as needed for muscle spasms. 1-2 tabs every 6 hours PRN. (Patient not taking: Reported on 08/25/2021)     nitrofurantoin, macrocrystal-monohydrate, (MACROBID) 100 MG capsule Take 1 capsule (100 mg total) by mouth every 12 (twelve) hours.  (Patient not taking: Reported on 09/25/2021) 14 capsule 0   ondansetron (ZOFRAN) 8 MG tablet Take 1 tablet (8 mg total) by mouth 2 (two) times daily as needed (Nausea or vomiting). (Patient not taking: Reported on 10/15/2021) 30 tablet 1   polyethylene glycol powder (GLYCOLAX/MIRALAX) 17 GM/SCOOP powder Take 17 g by mouth daily. 3350 g 1   potassium chloride SA (KLOR-CON M) 20 MEQ tablet Take 1 tablet (20 mEq total) by mouth daily. 30 tablet 0   prochlorperazine (COMPAZINE) 10 MG tablet Take 1 tablet (10 mg total) by mouth every 6 (six) hours as needed (Nausea or vomiting). (Patient not taking: Reported on 10/15/2021) 30 tablet 1   triamterene-hydrochlorothiazide (MAXZIDE-25) 37.5-25 MG tablet Take 1 tablet by mouth daily.     vitamin E 180 MG (400 UNITS) capsule Take 400 Units by mouth daily.     No current facility-administered medications for this visit.   Facility-Administered Medications Ordered in Other Visits  Medication Dose Route Frequency Provider Last Rate Last Admin   fluorouracil (ADRUCIL) 4,350 mg in sodium chloride 0.9 % 63 mL chemo infusion  2,400 mg/m2 (Treatment Plan Recorded) Intravenous 1 day or 1 dose [name], MD       fluorouracil (ADRUCIL) chemo injection 700 mg  400 mg/m2 (Treatment Plan Recorded) Intravenous Once [name], MD       leucovorin 700 mg in sodium chloride 0.9 % 250 mL infusion  700 mg Intravenous Once [name], MD       potassium chloride 20 mEq in 100 mL IVPB  20 mEq Intravenous Once [name], MD 100 mL/hr at 10/15/21 1012 20 mEq at 10/15/21 1012    VITAL SIGNS: There were no vitals taken for this visit. There were no vitals filed for this visit.  Estimated body mass index is 38.52 kg/m as calculated from the following:   Height as of 10/09/21: 4\' 9"  (1.448 m).   Weight as of an earlier encounter on 10/15/21: 178 lb (80.7 kg).  LABS: CBC:    Component Value Date/Time   WBC 3.6 (L) 10/15/2021 0820   HGB 11.1 (L) 10/15/2021 0820   HCT 35.6 (L)  10/15/2021 0820   PLT 209 10/15/2021 0820   MCV 95.2 10/15/2021 0820   NEUTROABS 1.3 (L) 10/15/2021 0820   LYMPHSABS 1.8 10/15/2021 0820   MONOABS 0.4 10/15/2021 0820   EOSABS 0.2 10/15/2021 0820   BASOSABS 0.0 10/15/2021 0820   Comprehensive Metabolic Panel:    Component Value Date/Time   NA 136 10/15/2021 0820   K 3.1 (L) 10/15/2021 0820   CL 102 10/15/2021 0820   CO2 29 10/15/2021 0820   BUN 14 10/15/2021 0820   CREATININE 0.75 10/15/2021 0820  CREATININE 0.87 08/13/2020 1129   GLUCOSE 131 (H) 10/15/2021 0820   CALCIUM 8.9 10/15/2021 0820   AST 24 10/15/2021 0820   ALT 17 10/15/2021 0820   ALKPHOS 101 10/15/2021 0820   BILITOT 0.4 10/15/2021 0820   PROT 7.3 10/15/2021 0820   ALBUMIN 3.6 10/15/2021 0820    RADIOGRAPHIC STUDIES: CT CHEST WO CONTRAST  Result Date: 09/23/2021 CLINICAL DATA:  Colon mass EXAM: CT CHEST WITHOUT CONTRAST TECHNIQUE: Multidetector CT imaging of the chest was performed following the standard protocol without IV contrast. COMPARISON:  CT abdomen and pelvis dated August 21, 2021 FINDINGS: Cardiovascular: No pericardial effusion. No significant coronary artery calcifications. Atherosclerotic disease of the thoracic aorta. Mediastinum/Nodes: Calcified lymph nodes of the mediastinum and hilum. No pathologically enlarged lymph nodes seen in the chest. Lungs/Pleura: Central airways are patent. Calcified nodules of the right middle lobe and left lower lobe. Ground-glass opacities of the right upper lobe, lingula and left lower lobe with associated linear opacities that likely represent atelectasis or scarring. Nonspecific small solid pulmonary nodules. Largest is located in the right upper lobe and measures 4 mm on series 2, image 27. Upper Abdomen: New heterogeneous lesion of the left lobe of the liver measuring 5.2 x 4.1 cm. Additional scattered subtle low-attenuation lesions are seen which are similar to slightly increased in size when compared with prior  exam. Parenchymal calcifications seen in the bilateral kidneys. No acute abnormality. 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 No aggressive appearing osseous lesions. IMPRESSION: 1. No definite evidence of metastatic disease in the chest. 2. Nonspecific small solid pulmonary nodules. Largest is located in the right upper lobe and measures 4 mm. Recommend attention on follow-up. 3. Ground-glass opacities of the right upper lobe, lingula and left lower lobe with associated linear opacities that likely represent atelectasis or scarring. Findings are likely infectious or inflammatory. Recommend attention on follow-up. 4. New heterogeneous lesion of the left lobe of the liver measuring up to 5.2 cm, likely a hematoma related to recent biopsy. Contrast-enhanced MRI of the liver could be performed if further evaluation is desired. 5. Additional scattered subtle low-attenuation liver lesions are similar to slightly increased in size when compared with prior exam, evaluation is limited due to lack of IV contrast. 6. Aortic Atherosclerosis (ICD10-I70.0). Electronically Signed   By: Yetta Glassman M.D.   On: 09/23/2021 10:02   IR IMAGING GUIDED PORT INSERTION  Result Date: 10/09/2021 INDICATION: History of colorectal cancer, metastatic to the liver EXAM: IMPLANTED PORT A CATH PLACEMENT WITH ULTRASOUND AND FLUOROSCOPIC GUIDANCE MEDICATIONS: None ANESTHESIA/SEDATION: Moderate (conscious) sedation was employed during this procedure. A total of Versed 1 mg and Fentanyl 50 mcg was administered intravenously. Moderate Sedation Time: 19 minutes. The patient's level of consciousness and vital signs were monitored continuously by radiology nursing throughout the procedure under my direct supervision. FLUOROSCOPY TIME:  1.6 minutes (21.3 mGy) COMPLICATIONS: None immediate. PROCEDURE: Informed written consent was obtained from the patient after a  thorough discussion of the procedural risks, benefits and alternatives. All questions were addressed. Maximal Sterile Barrier Technique was utilized including caps, mask, sterile gowns, sterile gloves, sterile drape, hand hygiene and skin antiseptic. A timeout was performed prior to the initiation of the procedure. The patient was placed supine on the exam table. The right neck and chest was prepped and draped in the standard sterile fashion. A preliminary ultrasound of the right neck was performed and demonstrates a patent right internal jugular vein. A  permanent ultrasound image was stored in the electronic medical record. The overlying skin was anesthetized with 1% Lidocaine. Using ultrasound guidance, access was obtained into the right internal jugular vein using a 21 gauge micropuncture set. A wire was advanced into the SVC, a short incision was made at the puncture site, and serial dilatation performed. Next, in an ipsilateral infraclavicular location, an incision was made at the site of the subcutaneous reservoir. Blunt dissection was used to open a pocket to contain the reservoir. A subcutaneous tunnel was then created from the port site to the puncture site. A(n) 8 Fr single lumen catheter was advanced through the tunnel. The catheter was attached to the port and this was placed in the subcutaneous pocket. Under fluoroscopic guidance, a peel away sheath was placed, and the catheter was trimmed to the appropriate length and was advanced into the central veins. The catheter length is 27 cm. The tip of the catheter lies near the superior cavoatrial junction. The port flushes and aspirates appropriately. The port was flushed and locked with heparinized saline. The port pocket was closed in 2 layers using 3-0 and 4-0 Vicryl/absorbable suture. Dermabond was also applied to both incisions. The patient tolerated the procedure well and was transferred to recovery in stable condition. IMPRESSION: Successful placement  of a left chest port via the left internal jugular vein. The port is ready for immediate use. Electronically Signed   By: Albin Felling M.D.   On: 10/09/2021 16:00    PERFORMANCE STATUS (ECOG) : 3 - Symptomatic, >50% confined to bed  Review of Systems Unless otherwise noted, a complete review of systems is negative.  Physical Exam General: NAD Pulmonary: Unlabored Extremities: no edema, no joint deformities Skin: no rashes Neurological: Weakness but otherwise nonfocal  IMPRESSION: Patient seen in infusion.  She reports that she is doing reasonably well and denies significant changes or concerns.  No symptomatic complaints at present.  MOST Form reviewed and sent home with patient to discuss with daughter.  PLAN: -Continue current scope of treatment -MOST Form reviewed -Follow-up telephone visit 1 to 2 months   Patient expressed understanding and was in agreement with this plan. She also understands that She can call the clinic at any time with any questions, concerns, or complaints.     Time Total: 15 minutes  Visit consisted of counseling and education dealing with the complex and emotionally intense issues of symptom management and palliative care in the setting of serious and potentially life-threatening illness.Greater than 50%  of this time was spent counseling and coordinating care related to the above assessment and plan.  Signed by: Altha Harm, PhD, NP-C

## 2021-10-15 NOTE — Progress Notes (Signed)
Ok to proceed with Bev today with port placement done on 1/5 per MD

## 2021-10-15 NOTE — Telephone Encounter (Signed)
Attempted to triage pt. States she will only speak to Ms. Baity or her nurse. Adamantly declines any triage. Assured pt NT would route to practice. CB# [phone] Reason for Disposition . [1] Caller demands to speak with the PCP AND [2] about sick adult (or sick caller)  Protocols used: Difficult Call-A-AH

## 2021-10-15 NOTE — Progress Notes (Unsigned)
Tried x 2 for Chelsea [name] to leave urine sample for Bev today. Chelsea [name] states unable. Chelsea [name] states that she has had uti symptoms of urgency, frequency .  States she was tried about month ago with abx- but s/s have not resolved.  Per Dr Tasia Catchings-  hold tx today.  Obtain urinalysis & culture. Await results and plan tbd.  Chelsea [name] aware. Spoke with son-in-law [name]- . Both verbalize understanding. Chelsea [name] discharged stable.

## 2021-10-15 NOTE — Telephone Encounter (Signed)
Summary: UTI, cannot start chemo bc of this   Pt says that she cannot begin her chemo treatment bc she has a UTI   Best contact: [phone]         Left message to call back about symptoms.

## 2021-10-16 ENCOUNTER — Ambulatory Visit: Payer: Medicare Other

## 2021-10-16 ENCOUNTER — Ambulatory Visit: Payer: Medicare Other | Admitting: Oncology

## 2021-10-16 ENCOUNTER — Telehealth: Payer: Self-pay | Admitting: *Deleted

## 2021-10-16 ENCOUNTER — Other Ambulatory Visit: Payer: Medicare Other

## 2021-10-16 LAB — URINE CULTURE

## 2021-10-16 NOTE — Telephone Encounter (Signed)
I called the patient and she informed me that her chemotherapy was cancelled because of her abnormal urinalysis. She said she have chronic UTI and spoke with her urologist office about this as well. She informed me that the cancer center called her this morning and informed her that they scheduled an appt with her PCP Rollene Fare on 1/24 and after that appt she will need to follow back up with Oncology. The appt she have scheduled is a physical appt. So I'm unsure if she might be a little confuse with the follow up appt.

## 2021-10-16 NOTE — Telephone Encounter (Signed)
Patient called stating that the bandage over her port came off during the night and was asking what to do with her port site. She told me that she has no gauze at home. I advised that she keep the area clean and dry and to call for any s/s of infection such as redness, drainage, swelling or hot to touch.

## 2021-10-17 ENCOUNTER — Inpatient Hospital Stay: Payer: Medicare HMO

## 2021-10-17 ENCOUNTER — Other Ambulatory Visit: Payer: Self-pay | Admitting: Emergency Medicine

## 2021-10-17 ENCOUNTER — Telehealth: Payer: Self-pay | Admitting: *Deleted

## 2021-10-17 ENCOUNTER — Other Ambulatory Visit: Payer: Self-pay

## 2021-10-17 DIAGNOSIS — R300 Dysuria: Secondary | ICD-10-CM

## 2021-10-17 DIAGNOSIS — R350 Frequency of micturition: Secondary | ICD-10-CM

## 2021-10-17 NOTE — Telephone Encounter (Signed)
If she has a UTI, she needs to be seen before her physical appt

## 2021-10-17 NOTE — Telephone Encounter (Signed)
Spoke with [name], patient's daughter and explained to her the importance of holding of on treatment at last visit. Advised that we needed urine results in order to proceed. Per Dr. Tasia Catchings, her UA came back normal but UC was abnormal due to dirty urine. Dr. Tasia Catchings would like patient to come in this afternoon to obtain a new urine sample so we can repeat UC. Daughter states she will bring her in this afternoon to do clean catch. Advised our scheduler would contact her with appt for next week to start treatment assuming UC comes back ok.   Winferd Humphrey, please schedule patient for: Lab encounter today Lab/NP/ treatment next week.

## 2021-10-17 NOTE — Telephone Encounter (Signed)
Patient daughter informed of appointments scheduled for Monday.

## 2021-10-17 NOTE — Telephone Encounter (Signed)
[name] called asking for return call to inform her of what is going on with patient and why she did not get treatment why she needs to call her PCP States patient can't remember things well so please call her [phone]

## 2021-10-18 ENCOUNTER — Encounter: Payer: Self-pay | Admitting: Oncology

## 2021-10-18 ENCOUNTER — Other Ambulatory Visit
Admission: RE | Admit: 2021-10-18 | Discharge: 2021-10-18 | Disposition: A | Discharge status: Home / Self Care | Payer: Medicare HMO | Source: Ambulatory Visit | Attending: Oncology | Admitting: Oncology

## 2021-10-18 DIAGNOSIS — R350 Frequency of micturition: Secondary | ICD-10-CM

## 2021-10-18 DIAGNOSIS — C189 Malignant neoplasm of colon, unspecified: Secondary | ICD-10-CM | POA: Insufficient documentation

## 2021-10-18 DIAGNOSIS — C787 Secondary malignant neoplasm of liver and intrahepatic bile duct: Secondary | ICD-10-CM | POA: Insufficient documentation

## 2021-10-18 LAB — PROTEIN, URINE, RANDOM: Total Protein, Urine: 8 mg/dL

## 2021-10-18 NOTE — Addendum Note (Signed)
Addended by: [name] on: 10/18/2021 10:14 AM   Modules accepted: Orders

## 2021-10-19 LAB — URINE CULTURE: Culture: <10000 — AB

## 2021-10-20 ENCOUNTER — Inpatient Hospital Stay: Payer: Medicare HMO

## 2021-10-20 ENCOUNTER — Other Ambulatory Visit: Payer: Self-pay

## 2021-10-20 ENCOUNTER — Inpatient Hospital Stay (HOSPITAL_BASED_OUTPATIENT_CLINIC_OR_DEPARTMENT_OTHER): Payer: Medicare HMO | Admitting: Nurse Practitioner

## 2021-10-20 ENCOUNTER — Encounter: Payer: Self-pay | Admitting: Nurse Practitioner

## 2021-10-20 VITALS — BP 155/76 | HR 75 | Temp 98.6°F | Resp 18 | Wt 183.3 lb

## 2021-10-20 DIAGNOSIS — C787 Secondary malignant neoplasm of liver and intrahepatic bile duct: Secondary | ICD-10-CM

## 2021-10-20 DIAGNOSIS — C189 Malignant neoplasm of colon, unspecified: Secondary | ICD-10-CM

## 2021-10-20 DIAGNOSIS — R350 Frequency of micturition: Secondary | ICD-10-CM

## 2021-10-20 DIAGNOSIS — Z5111 Encounter for antineoplastic chemotherapy: Secondary | ICD-10-CM

## 2021-10-20 DIAGNOSIS — Z7189 Other specified counseling: Secondary | ICD-10-CM | POA: Diagnosis not present

## 2021-10-20 DIAGNOSIS — E876 Hypokalemia: Secondary | ICD-10-CM

## 2021-10-20 DIAGNOSIS — N898 Other specified noninflammatory disorders of vagina: Secondary | ICD-10-CM

## 2021-10-20 DIAGNOSIS — Z5112 Encounter for antineoplastic immunotherapy: Secondary | ICD-10-CM | POA: Diagnosis not present

## 2021-10-20 LAB — COMPREHENSIVE METABOLIC PANEL WITH GFR
ALT: 15 U/L (ref 0–44)
AST: 20 U/L (ref 15–41)
Albumin: 3.3 g/dL — ABNORMAL LOW (ref 3.5–5.0)
Alkaline Phosphatase: 93 U/L (ref 38–126)
Anion gap: 5 (ref 5–15)
BUN: 16 mg/dL (ref 8–23)
CO2: 29 mmol/L (ref 22–32)
Calcium: 8.5 mg/dL — ABNORMAL LOW (ref 8.9–10.3)
Chloride: 102 mmol/L (ref 98–111)
Creatinine, Ser: 0.8 mg/dL (ref 0.44–1.00)
GFR, Estimated: >60 mL/min
Glucose, Bld: 92 mg/dL (ref 70–99)
Potassium: 3.6 mmol/L (ref 3.5–5.1)
Sodium: 136 mmol/L (ref 135–145)
Total Bilirubin: 0.6 mg/dL (ref 0.3–1.2)
Total Protein: 7 g/dL (ref 6.5–8.1)

## 2021-10-20 LAB — CBC WITH DIFFERENTIAL/PLATELET
Abs Immature Granulocytes: 0.01 10*3/uL (ref 0.00–0.07)
Basophils Absolute: 0 10*3/uL (ref 0.0–0.1)
Basophils Relative: 1 %
Eosinophils Absolute: 0.1 10*3/uL (ref 0.0–0.5)
Eosinophils Relative: 3 %
HCT: 35.3 % — ABNORMAL LOW (ref 36.0–46.0)
Hemoglobin: 11 g/dL — ABNORMAL LOW (ref 12.0–15.0)
Immature Granulocytes: 0 %
Lymphocytes Relative: 43 %
Lymphs Abs: 1.7 10*3/uL (ref 0.7–4.0)
MCH: 29.2 pg (ref 26.0–34.0)
MCHC: 31.2 g/dL (ref 30.0–36.0)
MCV: 93.6 fL (ref 80.0–100.0)
Monocytes Absolute: 0.4 10*3/uL (ref 0.1–1.0)
Monocytes Relative: 10 %
Neutro Abs: 1.7 10*3/uL (ref 1.7–7.7)
Neutrophils Relative %: 43 %
Platelets: 201 10*3/uL (ref 150–400)
RBC: 3.77 MIL/uL — ABNORMAL LOW (ref 3.87–5.11)
RDW: 13.8 % (ref 11.5–15.5)
WBC: 3.9 10*3/uL — ABNORMAL LOW (ref 4.0–10.5)
nRBC: 0 % (ref 0.0–0.2)

## 2021-10-20 LAB — PROTEIN, URINE, RANDOM: Total Protein, Urine: 10 mg/dL

## 2021-10-20 MED ORDER — SODIUM CHLORIDE 0.9 % IV SOLN
5.0000 mg/kg | Freq: Once | INTRAVENOUS | Status: AC
  Administered 2021-10-20: 400 mg via INTRAVENOUS
  Filled 2021-10-20: qty 16

## 2021-10-20 MED ORDER — SODIUM CHLORIDE 0.9 % IV SOLN
Freq: Once | INTRAVENOUS | Status: AC
  Filled 2021-10-20: qty 250

## 2021-10-20 MED ORDER — SODIUM CHLORIDE 0.9 % IV SOLN
700.0000 mg | Freq: Once | INTRAVENOUS | Status: AC
  Administered 2021-10-20: 700 mg via INTRAVENOUS
  Filled 2021-10-20: qty 25

## 2021-10-20 MED ORDER — FLUOROURACIL CHEMO INJECTION 2.5 GM/50ML
400.0000 mg/m2 | Freq: Once | INTRAVENOUS | Status: AC
  Administered 2021-10-20: 700 mg via INTRAVENOUS
  Filled 2021-10-20: qty 14

## 2021-10-20 MED ORDER — SODIUM CHLORIDE 0.9 % IV SOLN
2400.0000 mg/m2 | INTRAVENOUS | Status: DC
  Administered 2021-10-20: 4300 mg via INTRAVENOUS
  Filled 2021-10-20: qty 86

## 2021-10-20 MED ORDER — PROCHLORPERAZINE MALEATE 10 MG PO TABS
10.0000 mg | ORAL_TABLET | Freq: Once | ORAL | Status: AC
  Administered 2021-10-20: 10 mg via ORAL
  Filled 2021-10-20: qty 1

## 2021-10-20 NOTE — Patient Instructions (Addendum)
[facility] [place]  Discharge Instructions: Thank you for choosing [facility] to provide your oncology and hematology care.  If you have a lab appointment with the [facility], please go directly to the [facility] and check in at the registration area.  Wear comfortable clothing and clothing appropriate for easy access to any Portacath or PICC line.   We strive to give you quality time with your provider. You may need to reschedule your appointment if you arrive late (15 or more minutes).  Arriving late affects you and other patients whose appointments are after yours.  Also, if you miss three or more appointments without notifying the office, you may be dismissed from the clinic at the provider's discretion.      For prescription refill requests, have your pharmacy contact our office and allow 72 hours for refills to be completed.    Today you received the following chemotherapy and/or immunotherapy agents: Avastin, 5FU      To help prevent nausea and vomiting after your treatment, we encourage you to take your nausea medication as directed.  BELOW ARE SYMPTOMS THAT SHOULD BE REPORTED IMMEDIATELY: *FEVER GREATER THAN 100.4 F (38 C) OR HIGHER *CHILLS OR SWEATING *NAUSEA AND VOMITING THAT IS NOT CONTROLLED WITH YOUR NAUSEA MEDICATION *UNUSUAL SHORTNESS OF BREATH *UNUSUAL BRUISING OR BLEEDING *URINARY PROBLEMS (pain or burning when urinating, or frequent urination) *BOWEL PROBLEMS (unusual diarrhea, constipation, pain near the anus) TENDERNESS IN MOUTH AND THROAT WITH OR WITHOUT PRESENCE OF ULCERS (sore throat, sores in mouth, or a toothache) UNUSUAL RASH, SWELLING OR PAIN  UNUSUAL VAGINAL DISCHARGE OR ITCHING   Items with * indicate a potential emergency and should be followed up as soon as possible or go to the Emergency Department if any problems should occur.  Please show the CHEMOTHERAPY ALERT CARD or IMMUNOTHERAPY ALERT CARD at check-in  to the Emergency Department and triage nurse.  Should you have questions after your visit or need to cancel or reschedule your appointment, please contact [facility] [place] [facility] [place]  [phone] and follow the prompts.  Office hours are 8:00 a.m. to 4:30 p.m. Monday - Friday. Please note that voicemails left after 4:00 p.m. may not be returned until the following business day.  We are closed weekends and major holidays. You have access to a nurse at all times for urgent questions. Please call the main number to the clinic [phone] and follow the prompts.  For any non-urgent questions, you may also contact your provider using MyChart. We now offer e-Visits for anyone 37 and older to request care online for non-urgent symptoms. For details visit [email or web address].   Also download the MyChart app! Go to the app store, search "MyChart", open the app, select [facility], and log in with your MyChart username and password.  Due to Covid, a mask is required upon entering the hospital/clinic. If you do not have a mask, one will be given to you upon arrival. For doctor visits, patients may have 1 support person aged 72 or older with them. For treatment visits, patients cannot have anyone with them due to current Covid guidelines and our immunocompromised population.  Bevacizumab injection What is this medication? BEVACIZUMAB (be va SIZ yoo mab) is a monoclonal antibody. It is used to treat many types of cancer. This medicine may be used for other purposes; ask your health care provider or pharmacist if you have questions. COMMON BRAND NAME(S): Alymsys, Avastin, MVASI, Zirabev What should I tell  my care team before I take this medication? They need to know if you have any of these conditions: diabetes heart disease high blood pressure history of coughing up blood prior anthracycline chemotherapy (e.g., doxorubicin, daunorubicin, epirubicin) recent or ongoing radiation  therapy recent or planning to have surgery stroke an unusual or allergic reaction to bevacizumab, hamster proteins, mouse proteins, other medicines, foods, dyes, or preservatives pregnant or trying to get pregnant breast-feeding How should I use this medication? This medicine is for infusion into a vein. It is given by a health care professional in a hospital or clinic setting. Talk to your pediatrician regarding the use of this medicine in children. Special care may be needed. Overdosage: If you think you have taken too much of this medicine contact a poison control center or emergency room at once. NOTE: This medicine is only for you. Do not share this medicine with others. What if I miss a dose? It is important not to miss your dose. Call your doctor or health care professional if you are unable to keep an appointment. What may interact with this medication? Interactions are not expected. This list may not describe all possible interactions. Give your health care provider a list of all the medicines, herbs, non-prescription drugs, or dietary supplements you use. Also tell them if you smoke, drink alcohol, or use illegal drugs. Some items may interact with your medicine. What should I watch for while using this medication? Your condition will be monitored carefully while you are receiving this medicine. You will need important blood work and urine testing done while you are taking this medicine. This medicine may increase your risk to bruise or bleed. Call your doctor or health care professional if you notice any unusual bleeding. Before having surgery, talk to your health care provider to make sure it is ok. This drug can increase the risk of poor healing of your surgical site or wound. You will need to stop this drug for 28 days before surgery. After surgery, wait at least 28 days before restarting this drug. Make sure the surgical site or wound is healed enough before restarting this drug.  Talk to your health care provider if questions. Do not become pregnant while taking this medicine or for 6 months after stopping it. Women should inform their doctor if they wish to become pregnant or think they might be pregnant. There is a potential for serious side effects to an unborn child. Talk to your health care professional or pharmacist for more information. Do not breast-feed an infant while taking this medicine and for 6 months after the last dose. This medicine has caused ovarian failure in some women. This medicine may interfere with the ability to have a child. You should talk to your doctor or health care professional if you are concerned about your fertility. What side effects may I notice from receiving this medication? Side effects that you should report to your doctor or health care professional as soon as possible: allergic reactions like skin rash, itching or hives, swelling of the face, lips, or tongue chest pain or chest tightness chills coughing up blood high fever seizures severe constipation signs and symptoms of bleeding such as bloody or black, tarry stools; red or dark-brown urine; spitting up blood or brown material that looks like coffee grounds; red spots on the skin; unusual bruising or bleeding from the eye, gums, or nose signs and symptoms of a blood clot such as breathing problems; chest pain; severe, sudden headache; pain,  swelling, warmth in the leg signs and symptoms of a stroke like changes in vision; confusion; trouble speaking or understanding; severe headaches; sudden numbness or weakness of the face, arm or leg; trouble walking; dizziness; loss of balance or coordination stomach pain sweating swelling of legs or ankles vomiting weight gain Side effects that usually do not require medical attention (report to your doctor or health care professional if they continue or are bothersome): back pain changes in taste decreased appetite dry  skin nausea tiredness This list may not describe all possible side effects. Call your doctor for medical advice about side effects. You may report side effects to FDA at 1-800-FDA-1088. Where should I keep my medication? This drug is given in a hospital or clinic and will not be stored at home. NOTE: This sheet is a summary. It may not cover all possible information. If you have questions about this medicine, talk to your doctor, pharmacist, or health care provider.  2022 Elsevier/Gold Standard (2021-06-10 00:00:00) Fluorouracil, 5-FU injection What is this medication? FLUOROURACIL, 5-FU (flure oh YOOR a sil) is a chemotherapy drug. It slows the growth of cancer cells. This medicine is used to treat many types of cancer like breast cancer, colon or rectal cancer, pancreatic cancer, and stomach cancer. This medicine may be used for other purposes; ask your health care provider or pharmacist if you have questions. COMMON BRAND NAME(S): Adrucil What should I tell my care team before I take this medication? They need to know if you have any of these conditions: blood disorders dihydropyrimidine dehydrogenase (DPD) deficiency infection (especially a virus infection such as chickenpox, cold sores, or herpes) kidney disease liver disease malnourished, poor nutrition recent or ongoing radiation therapy an unusual or allergic reaction to fluorouracil, other chemotherapy, other medicines, foods, dyes, or preservatives pregnant or trying to get pregnant breast-feeding How should I use this medication? This drug is given as an infusion or injection into a vein. It is administered in a hospital or clinic by a specially trained health care professional. Talk to your pediatrician regarding the use of this medicine in children. Special care may be needed. Overdosage: If you think you have taken too much of this medicine contact a poison control center or emergency room at once. NOTE: This medicine is  only for you. Do not share this medicine with others. What if I miss a dose? It is important not to miss your dose. Call your doctor or health care professional if you are unable to keep an appointment. What may interact with this medication? Do not take this medicine with any of the following medications: live virus vaccines This medicine may also interact with the following medications: medicines that treat or prevent blood clots like warfarin, enoxaparin, and dalteparin This list may not describe all possible interactions. Give your health care provider a list of all the medicines, herbs, non-prescription drugs, or dietary supplements you use. Also tell them if you smoke, drink alcohol, or use illegal drugs. Some items may interact with your medicine. What should I watch for while using this medication? Visit your doctor for checks on your progress. This drug may make you feel generally unwell. This is not uncommon, as chemotherapy can affect healthy cells as well as cancer cells. Report any side effects. Continue your course of treatment even though you feel ill unless your doctor tells you to stop. In some cases, you may be given additional medicines to help with side effects. Follow all directions for their use. Call  your doctor or health care professional for advice if you get a fever, chills or sore throat, or other symptoms of a cold or flu. Do not treat yourself. This drug decreases your body's ability to fight infections. Try to avoid being around people who are sick. This medicine may increase your risk to bruise or bleed. Call your doctor or health care professional if you notice any unusual bleeding. Be careful brushing and flossing your teeth or using a toothpick because you may get an infection or bleed more easily. If you have any dental work done, tell your dentist you are receiving this medicine. Avoid taking products that contain aspirin, acetaminophen, ibuprofen, naproxen, or  ketoprofen unless instructed by your doctor. These medicines may hide a fever. Do not become pregnant while taking this medicine. Women should inform their doctor if they wish to become pregnant or think they might be pregnant. There is a potential for serious side effects to an unborn child. Talk to your health care professional or pharmacist for more information. Do not breast-feed an infant while taking this medicine. Men should inform their doctor if they wish to father a child. This medicine may lower sperm counts. Do not treat diarrhea with over the counter products. Contact your doctor if you have diarrhea that lasts more than 2 days or if it is severe and watery. This medicine can make you more sensitive to the sun. Keep out of the sun. If you cannot avoid being in the sun, wear protective clothing and use sunscreen. Do not use sun lamps or tanning beds/booths. What side effects may I notice from receiving this medication? Side effects that you should report to your doctor or health care professional as soon as possible: allergic reactions like skin rash, itching or hives, swelling of the face, lips, or tongue low blood counts - this medicine may decrease the number of white blood cells, red blood cells and platelets. You may be at increased risk for infections and bleeding. signs of infection - fever or chills, cough, sore throat, pain or difficulty passing urine signs of decreased platelets or bleeding - bruising, pinpoint red spots on the skin, black, tarry stools, blood in the urine signs of decreased red blood cells - unusually weak or tired, fainting spells, lightheadedness breathing problems changes in vision chest pain mouth sores nausea and vomiting pain, swelling, redness at site where injected pain, tingling, numbness in the hands or feet redness, swelling, or sores on hands or feet stomach pain unusual bleeding Side effects that usually do not require medical attention  (report to your doctor or health care professional if they continue or are bothersome): changes in finger or toe nails diarrhea dry or itchy skin hair loss headache loss of appetite sensitivity of eyes to the light stomach upset unusually teary eyes This list may not describe all possible side effects. Call your doctor for medical advice about side effects. You may report side effects to FDA at 1-800-FDA-1088. Where should I keep my medication? This drug is given in a hospital or clinic and will not be stored at home. NOTE: This sheet is a summary. It may not cover all possible information. If you have questions about this medicine, talk to your doctor, pharmacist, or health care provider.  2022 Elsevier/Gold Standard (2021-06-10 00:00:00)

## 2021-10-20 NOTE — Progress Notes (Signed)
Per Lauren NP, ok to proceed with treatment and use the last urine protein level from 10/15/2021 for the Avastin.

## 2021-10-20 NOTE — Progress Notes (Signed)
Hematology/Oncology Follow Up Note [phone] [phone]     Patient Care Team: Jearld Fenton, NP as PCP - General (Internal Medicine) Clent Jacks, RN as Oncology Nurse Navigator  REFERRING PROVIDER: Jearld Fenton, NP   CHIEF COMPLAINTS/REASON FOR VISIT:  metastatic colon cancer  HISTORY OF PRESENTING ILLNESS: Chelsea [name] is a  76 y.o.  female with PMH listed below was seen in consultation at the request of  Jearld Fenton, NP  for evaluation of metastatic colon cancer.  Patient initially went to emergency room on 03/24/2021 for abdominal pain. 03/24/2021, CT scan of the abdomen showed marked bladder wall thickening with surrounding soft tissue stranding is noted.  Concerning for cystitis.  There is a lobulated mass between the posterior wall of the bladder and the rectum which appears to arise from the vaginal cuff.  This is indeterminate and difficult to characterize reflecting lack of IV contrast material.  Nonobstructing left renal calculus, left sacroiliitis, lumbar spondylosis aortic atherosclerosis. 03/24/2021 CT pelvic showed abdominal [place] soft tissue of right cecum, concerning for colon carcinoma.  Colonoscopy recommended.  Low-attenuation mass in the region of vaginal cuff is again noted.  Diffuse urinary bladder wall thickening and mild bilateral hydroureter possibly cystitis.  [place] 03/24/2021 Lobulated solid 3.8 cm mass confirmed by ultrasound. This is most compatible with a neoplasm but remains indeterminate as to the origin as the epicenter seems to be outside of both the vaginal cuff and the adjacent colon, while the lesion appears inseparable from both. Extensive echogenic debris within the distended urinary bladder.   03/26/2021, patient was seen by Dr. Theora Gianotti for evaluation of colonic/vaginal cuff pelvic mass.  Patient also was referred to gastroenterology for colonoscopy.  03/26/2021 CEA 3.4.  CA125 9.7  04/04/2021, status post  colonoscopy which showed a frond like /villous nonobstructing large mass was found in the cecum, this was biopsied.  Terminal ileum was briefly intubated and appeared normal.  Diverticulosis in the sigmoid colon.  The rectum, sigmoid colon, descending colon, transverse colon and ascending colon were normal.  Nonbleeding internal hemorrhoids.  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  Given that the biopsy may not be representative of the entire underlying lesion.  Patient was recommended to establish with surgeon for evaluation.  Patient no showed for her surgery appointment and as well as her follow-up appointment with gastroenterology.  Per daughter, patient was having cervical spine surgery and she prioritized that over the colon surgery.  08/19/2021, patient establish care with surgery Dr. Christian Mate  08/21/2021, CT abdomen pelvis with contrast showed soft tissue mass at the base of cecum measuring up to 5 cm, slightly increased in size. Interval development of multiple low-density lesions throughout the liver highly suggestive for metastatic disease.  Complex mass in the region of the vaginal cuff measuring up to 5.2 cm, increased in size.  Right ovary also appears more prominent on the current examination.  Moderate large volume of stool throughout the colon.  Mild urinary bladder wall thickening.  Colonic diverticulosis without evidence of active diverticulitis.  Aortic atherosclerosis  09/04/2021- Underwent biopsy of left liver lobe lesion. Pathology showed metastatic adenocarcinoma, compatible with colorectal primary.  09/15/21- CEA 34.2 (elevated)  She was referred to establish care with oncology.  She was accompanied by her daughter today.  Patient walks with a walker.+ Night sweats + weight loss.  Denies any abdominal pain currently, melena, blood in the stool.  She is currently being followed by home health PT/OT  following a previous spinal  surgery  10/10/20- mediport placement   INTERVAL HISTORY Chelsea [name] is a 76 y.o. female with above history of metastatic colon cancer who returns to clinic for further evaluation and consideration of cycle 1 of 5-FU and bevacizumab.  She continues to feel at baseline today and denies specific complaints.  She is highly anxious about starting chemotherapy and has several questions about her disease, diagnosis, and chemotherapy.  She is questioning postponing chemotherapy another week.  Has ongoing urinary symptoms.  Has known vaginal cuff mass and sees GYN oncology later this week.  Denies burning when she urinates.  No nausea, vomiting, fevers, chills, abdominal pain.  She is accompanied by her daughter who contributes to history.    Review of Systems  Constitutional:  Negative for appetite change, fatigue and unexpected weight change.  HENT:   Negative for mouth sores, sore throat and trouble swallowing.   Respiratory:  Negative for chest tightness and shortness of breath.   Cardiovascular:  Negative for leg swelling.  Gastrointestinal:  Negative for abdominal pain, constipation, diarrhea, nausea and vomiting.  Genitourinary:  Positive for difficulty urinating and frequency. Negative for bladder incontinence, dysuria, hematuria, menstrual problem, pelvic pain, vaginal bleeding and vaginal discharge.   Musculoskeletal:  Positive for back pain and neck pain. Negative for flank pain and neck stiffness.  Skin:  Negative for itching, rash and wound.  Neurological:  Negative for dizziness, headaches, light-headedness and numbness.  Hematological:  Negative for adenopathy. Does not bruise/bleed easily.  Psychiatric/Behavioral:  Negative for confusion, depression and sleep disturbance. The patient is nervous/anxious.    MEDICAL HISTORY:  Past Medical History:  Diagnosis Date   Anxiety    Arthritis    Depression    Hyperlipidemia    Hypertension    Liver mass    Metastatic colon cancer  to liver ([place])    Port-A-Cath in place    Seizures ([place])    Spinal stenosis    Ulcer    Urinary incontinence     SURGICAL HISTORY: Past Surgical History:  Procedure Laterality Date   ABDOMINAL HYSTERECTOMY     BACK SURGERY     due to polio   BREAST BIOPSY Bilateral    neg   BREAST BIOPSY Right 2011   neg/stereo   CARPAL TUNNEL RELEASE     CATARACT EXTRACTION Right 2020   COLONOSCOPY WITH PROPOFOL N/A 04/04/2021   Procedure: COLONOSCOPY WITH PROPOFOL;  Surgeon: Virgel Manifold, MD;  Location: [facility] ENDOSCOPY;  Service: Endoscopy;  Laterality: N/A;   EYE SURGERY     IR IMAGING GUIDED PORT INSERTION  10/09/2021    SOCIAL HISTORY: Social History   Socioeconomic History   Marital status: Divorced    Spouse name: Not on file   Number of children: Not on file   Years of education: Not on file   Highest education level: Not on file  Occupational History   Not on file  Tobacco Use   Smoking status: Never   Smokeless tobacco: Never  Vaping Use   Vaping Use: Never used  Substance and Sexual Activity   Alcohol use: Never   Drug use: No   Sexual activity: Not on file  Other Topics Concern   Not on file  Social History Narrative   Not on file   Social Determinants of Health   Financial Resource Strain: Not on file  Food Insecurity: Not on file  Transportation Needs: Not on file  Physical Activity: Not on file  Stress: Not on file  Social Connections: Not on file  Intimate Partner Violence: Not on file    FAMILY HISTORY: Family History  Problem Relation Age of Onset   Arthritis Mother    Heart disease Mother    Stroke Mother    Hypertension Mother    COPD Mother    Sudden death Sister    Other Father        unknown medical history   Breast cancer Neg Hx     ALLERGIES:  is allergic to penicillins.  MEDICATIONS:  Current Outpatient Medications  Medication Sig Dispense Refill   acetaminophen (TYLENOL) 500 MG tablet Take 500 mg by mouth every 6 (six)  hours as needed.     ALPRAZolam (XANAX) 0.5 MG tablet TAKE 1 TABLET BY MOUTH TWICE A DAY AS NEEDED FOR ANXIETY 60 tablet 0   busPIRone (BUSPAR) 5 MG tablet Take 1 tablet (5 mg total) by mouth 3 (three) times daily. 270 tablet 1   Cyanocobalamin (VITAMIN B 12 PO) Take 500 mcg by mouth daily.     diclofenac Sodium (VOLTAREN) 1 % GEL Apply 4 g topically 4 (four) times daily. 4 g 0   FLUoxetine (PROZAC) 20 MG capsule TAKE 3 CAPSULES (60 MG TOTAL) BY MOUTH EVERY MORNING. 270 capsule 0   gabapentin (NEURONTIN) 100 MG capsule TAKE 2 CAPSULES BY MOUTH 2 TIMES DAILY. 360 capsule 1   lidocaine (LIDODERM) 5 % Place 1 patch onto the skin daily. Remove & Discard patch within 12 hours or as directed by MD 1 patch 0   lidocaine-prilocaine (EMLA) cream Apply small amount of cream to port site 1-2 hour prior to chemo treatment. 30 g 3   ondansetron (ZOFRAN) 8 MG tablet Take 1 tablet (8 mg total) by mouth 2 (two) times daily as needed (Nausea or vomiting). 30 tablet 1   polyethylene glycol powder (GLYCOLAX/MIRALAX) 17 GM/SCOOP powder Take 17 g by mouth daily. 3350 g 1   potassium chloride SA (KLOR-CON M) 20 MEQ tablet Take 1 tablet (20 mEq total) by mouth daily. 30 tablet 0   prochlorperazine (COMPAZINE) 10 MG tablet Take 1 tablet (10 mg total) by mouth every 6 (six) hours as needed (Nausea or vomiting). 30 tablet 1   triamterene-hydrochlorothiazide (MAXZIDE-25) 37.5-25 MG tablet Take 1 tablet by mouth daily.     vitamin E 180 MG (400 UNITS) capsule Take 400 Units by mouth daily.     aspirin 325 MG tablet Take 325 mg by mouth daily. (Patient not taking: Reported on 10/20/2021)     meclizine (ANTIVERT) 12.5 MG tablet Take 1 tablet (12.5 mg total) by mouth 3 (three) times daily as needed for dizziness. (Patient not taking: Reported on 09/11/2021) 30 tablet 0   methocarbamol (ROBAXIN) 500 MG tablet Take 500 mg by mouth every 6 (six) hours as needed for muscle spasms. 1-2 tabs every 6 hours PRN. (Patient not taking:  Reported on 08/25/2021)     nitrofurantoin, macrocrystal-monohydrate, (MACROBID) 100 MG capsule Take 1 capsule (100 mg total) by mouth every 12 (twelve) hours. (Patient not taking: Reported on 09/25/2021) 14 capsule 0   No current facility-administered medications for this visit.    PHYSICAL EXAMINATION: ECOG PERFORMANCE STATUS: 2 - Symptomatic, <50% confined to bed Vitals:   10/20/21 0828  BP: (!) 155/76  Pulse: 75  Resp: 18  Temp: 98.6 F (37 C)  SpO2: 94%   Filed Weights   10/20/21 0828  Weight: 183 lb 4.8 oz (83.1 kg)  Physical Exam Constitutional:      General: She is not in acute distress.    Comments: Patient ambulates with a walker at home. Wheelchair in clinic today.  HENT:     Head: Normocephalic and atraumatic.  Eyes:     General: No scleral icterus. Cardiovascular:     Rate and Rhythm: Normal rate and regular rhythm.  Pulmonary:     Effort: No respiratory distress.     Breath sounds: No wheezing.  Abdominal:     General: There is no distension.     Tenderness: There is no guarding.  Musculoskeletal:        General: No deformity or signs of injury.  Skin:    General: Skin is warm and dry.     Findings: No erythema or rash.  Neurological:     Mental Status: She is alert and oriented to person, place, and time. Mental status is at baseline.     Cranial Nerves: No cranial nerve deficit.  Psychiatric:        Mood and Affect: Mood normal.        Behavior: Behavior normal.    LABORATORY DATA:  I have reviewed the data as listed Lab Results  Component Value Date   WBC 3.9 (L) 10/20/2021   HGB 11.0 (L) 10/20/2021   HCT 35.3 (L) 10/20/2021   MCV 93.6 10/20/2021   PLT 201 10/20/2021   Recent Labs    09/15/21 1626 10/15/21 0820 10/20/21 0806  NA 139 136 136  K 3.5 3.1* 3.6  CL 104 102 102  CO2 28 29 29   GLUCOSE 89 131* 92  BUN 14 14 16   CREATININE 0.84 0.75 0.80  CALCIUM 8.7* 8.9 8.5*  GFRNONAA >60 >60 >60  PROT 7.5 7.3 7.0  ALBUMIN 3.7  3.6 3.3*  AST 25 24 20   ALT 20 17 15   ALKPHOS 84 101 93  BILITOT 0.7 0.4 0.6    Iron/TIBC/Ferritin/ %Sat    Component Value Date/Time   FERRITIN 58 10/24/2020 0402      RADIOGRAPHIC STUDIES: I have personally reviewed the radiological images as listed and agreed with the findings in the report. CT CHEST WO CONTRAST  Result Date: 09/23/2021 CLINICAL DATA:  Colon mass EXAM: CT CHEST WITHOUT CONTRAST TECHNIQUE: Multidetector CT imaging of the chest was performed following the standard protocol without IV contrast. COMPARISON:  CT abdomen and pelvis dated August 21, 2021 FINDINGS: Cardiovascular: No pericardial effusion. No significant coronary artery calcifications. Atherosclerotic disease of the thoracic aorta. Mediastinum/Nodes: Calcified lymph nodes of the mediastinum and hilum. No pathologically enlarged lymph nodes seen in the chest. Lungs/Pleura: Central airways are patent. Calcified nodules of the right middle lobe and left lower lobe. Ground-glass opacities of the right upper lobe, lingula and left lower lobe with associated linear opacities that likely represent atelectasis or scarring. Nonspecific small solid pulmonary nodules. Largest is located in the right upper lobe and measures 4 mm on series 2, image 27. Upper Abdomen: New heterogeneous lesion of the left lobe of the liver measuring 5.2 x 4.1 cm. Additional scattered subtle low-attenuation lesions are seen which are similar to slightly increased in size when compared with prior exam. Parenchymal calcifications seen in the bilateral kidneys. No acute abnormality. 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 No aggressive appearing osseous lesions. IMPRESSION: 1. No definite evidence of metastatic disease in the chest. 2. Nonspecific small solid pulmonary nodules. Largest is located in the right upper  lobe and measures 4 mm. Recommend attention on follow-up. 3.  Ground-glass opacities of the right upper lobe, lingula and left lower lobe with associated linear opacities that likely represent atelectasis or scarring. Findings are likely infectious or inflammatory. Recommend attention on follow-up. 4. New heterogeneous lesion of the left lobe of the liver measuring up to 5.2 cm, likely a hematoma related to recent biopsy. Contrast-enhanced MRI of the liver could be performed if further evaluation is desired. 5. Additional scattered subtle low-attenuation liver lesions are similar to slightly increased in size when compared with prior exam, evaluation is limited due to lack of IV contrast. 6. Aortic Atherosclerosis (ICD10-I70.0). Electronically Signed   By: Yetta Glassman M.D.   On: 09/23/2021 10:02   IR IMAGING GUIDED PORT INSERTION  Result Date: 10/09/2021 INDICATION: History of colorectal cancer, metastatic to the liver EXAM: IMPLANTED PORT A CATH PLACEMENT WITH ULTRASOUND AND FLUOROSCOPIC GUIDANCE MEDICATIONS: None ANESTHESIA/SEDATION: Moderate (conscious) sedation was employed during this procedure. A total of Versed 1 mg and Fentanyl 50 mcg was administered intravenously. Moderate Sedation Time: 19 minutes. The patient's level of consciousness and vital signs were monitored continuously by radiology nursing throughout the procedure under my direct supervision. FLUOROSCOPY TIME:  1.6 minutes (99.2 mGy) COMPLICATIONS: None immediate. PROCEDURE: Informed written consent was obtained from the patient after a thorough discussion of the procedural risks, benefits and alternatives. All questions were addressed. Maximal Sterile Barrier Technique was utilized including caps, mask, sterile gowns, sterile gloves, sterile drape, hand hygiene and skin antiseptic. A timeout was performed prior to the initiation of the procedure. The patient was placed supine on the exam table. The right neck and chest was prepped and draped in the standard sterile fashion. A preliminary ultrasound  of the right neck was performed and demonstrates a patent right internal jugular vein. A permanent ultrasound image was stored in the electronic medical record. The overlying skin was anesthetized with 1% Lidocaine. Using ultrasound guidance, access was obtained into the right internal jugular vein using a 21 gauge micropuncture set. A wire was advanced into the SVC, a short incision was made at the puncture site, and serial dilatation performed. Next, in an ipsilateral infraclavicular location, an incision was made at the site of the subcutaneous reservoir. Blunt dissection was used to open a pocket to contain the reservoir. A subcutaneous tunnel was then created from the port site to the puncture site. A(n) 8 Fr single lumen catheter was advanced through the tunnel. The catheter was attached to the port and this was placed in the subcutaneous pocket. Under fluoroscopic guidance, a peel away sheath was placed, and the catheter was trimmed to the appropriate length and was advanced into the central veins. The catheter length is 27 cm. The tip of the catheter lies near the superior cavoatrial junction. The port flushes and aspirates appropriately. The port was flushed and locked with heparinized saline. The port pocket was closed in 2 layers using 3-0 and 4-0 Vicryl/absorbable suture. Dermabond was also applied to both incisions. The patient tolerated the procedure well and was transferred to recovery in stable condition. IMPRESSION: Successful placement of a left chest port via the left internal jugular vein. The port is ready for immediate use. Electronically Signed   By: Albin Felling M.D.   On: 10/09/2021 16:00     ASSESSMENT & PLAN:  No diagnosis found.  Cancer Staging  Metastatic colon cancer to liver [facility]) Staging form: Colon and Rectum - Neuroendocine Tumors, AJCC 8th Edition - Clinical stage  from 09/16/2021: Stage IV (cTX, cNX, cM1) - Signed by [name], MD on 09/16/2021  Metastatic colon cancer  to liver-possibly pelvis- Colon mass in July 22 was high grade dysplasia. Imaging showed new lesions in liver and she is s/p liver biopsy which was consistent with metastatic adenocarcinoma of colorectal primary. Insufficient tissue for NGS. Awaiting vaginal biopsy for possible additional tissue for NGS testing based on pathology. CEA was normal at diagnosis. Now elevated at 34. This can be followed. Labs reviewed and acceptable for treatment. Proceed with 5-FU/bevacizumab today. We reviewed that typically restaging scans are done after 3 cycles to assess response but I will defer to Dr. Tasia Catchings who is patient's medical oncologist.  Pelvic/Abdominal pain- initially presented to ER for pain x 2 weeks in June 2022.  Urinary incontinence- managed by Urology. Patient has not followed up. I question mass effect of colon & vaginal mass. No evidence of UTI. Monitor.  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 Patient saw gyn onc in June 2022. On exam, cervix, uterus surgically absent. Retraction of upper vagina was noted on exam. She has appt with gyn onc later this week for repeat pelvic exam and possible biopsy. CA 125 was previously normal.  Depression- on fluoxetine 60 mg daily per PCP. Previously referred to psychiatry.  Hypokalemia- 3.6 today. Monitor. Continue oral potassium Anemia- hmg 11. Normocytic. Monitor. Likely anemia of chronic disease.  Goals of care- reviewed her diagnosis, workup, and treatment recommendations as well as rationale for treatment of known malignancy. She explains that she is taking treatment to extend her quality of life and length of life for her children, grand children, and great grand children. She has a support system of children though not local, they are here regularly to help support her. She is worried about the chemotherapy making her too sick to enjoy her quality of life. I reviewed supportive care measures and services available and how to  access those if needed. She does express that she would prefer to not be hospitalized at [facility] if needed but would consider other hospitals.   No orders of the defined types were placed in this encounter.   All questions were answered. The patient knows to call the clinic with any problems questions or concerns.  Thank you for allowing me to participate in the care of this pleasant patient.   I discussed the assessment and treatment plan with the patient. The patient was provided an opportunity to ask questions and all were answered. The patient agreed with the plan and demonstrated an understanding of the instructions.   The patient was advised to call back or seek an in-person evaluation if the symptoms worsen or if the condition fails to improve as anticipated.   I spent 45 minutes face-to-face visit time dedicated to the care of this patient on the date of this encounter to including pre-visit review of medical oncology notes, urology notes, gyn onc notes, labs, imaging, pathology results, face-to-face time with the patient, and post visit ordering of testing/documentation.    Beckey Rutter, DNP, AGNP-C [place] [facility] [phone] (clinic)  cc Jearld Fenton, NP, Dr Tasia Catchings

## 2021-10-20 NOTE — Telephone Encounter (Signed)
The pt urine culture from 10/17/2021 came back  Abnormal  <10,000 COLONIES/mL INSIGNIFICANT GROWTH . She was able to proceed with her infusion at the cancer center.  No follow up urine sample or appt needed to address your urinalysis result.

## 2021-10-21 ENCOUNTER — Encounter: Payer: Self-pay | Admitting: Oncology

## 2021-10-21 NOTE — Telephone Encounter (Signed)
noted 

## 2021-10-22 ENCOUNTER — Inpatient Hospital Stay: Payer: Medicare HMO

## 2021-10-22 ENCOUNTER — Other Ambulatory Visit: Payer: Self-pay

## 2021-10-22 ENCOUNTER — Inpatient Hospital Stay (HOSPITAL_BASED_OUTPATIENT_CLINIC_OR_DEPARTMENT_OTHER): Payer: Medicare HMO | Admitting: Hospice and Palliative Medicine

## 2021-10-22 ENCOUNTER — Other Ambulatory Visit: Payer: Self-pay | Admitting: Lab

## 2021-10-22 ENCOUNTER — Inpatient Hospital Stay (HOSPITAL_BASED_OUTPATIENT_CLINIC_OR_DEPARTMENT_OTHER): Payer: Medicare HMO | Admitting: Obstetrics and Gynecology

## 2021-10-22 VITALS — BP 114/63 | HR 77 | Temp 98.7°F | Resp 18 | Wt 183.0 lb

## 2021-10-22 DIAGNOSIS — N898 Other specified noninflammatory disorders of vagina: Secondary | ICD-10-CM | POA: Diagnosis not present

## 2021-10-22 DIAGNOSIS — C189 Malignant neoplasm of colon, unspecified: Secondary | ICD-10-CM

## 2021-10-22 DIAGNOSIS — Z5112 Encounter for antineoplastic immunotherapy: Secondary | ICD-10-CM | POA: Diagnosis not present

## 2021-10-22 DIAGNOSIS — C787 Secondary malignant neoplasm of liver and intrahepatic bile duct: Secondary | ICD-10-CM

## 2021-10-22 DIAGNOSIS — R531 Weakness: Secondary | ICD-10-CM | POA: Diagnosis not present

## 2021-10-22 LAB — CBC WITH DIFFERENTIAL/PLATELET
Abs Immature Granulocytes: 0.01 10*3/uL (ref 0.00–0.07)
Basophils Absolute: 0 10*3/uL (ref 0.0–0.1)
Basophils Relative: 1 %
Eosinophils Absolute: 0.1 10*3/uL (ref 0.0–0.5)
Eosinophils Relative: 2 %
HCT: 33.6 % — ABNORMAL LOW (ref 36.0–46.0)
Hemoglobin: 10.6 g/dL — ABNORMAL LOW (ref 12.0–15.0)
Immature Granulocytes: 0 %
Lymphocytes Relative: 43 %
Lymphs Abs: 1.4 10*3/uL (ref 0.7–4.0)
MCH: 29.1 pg (ref 26.0–34.0)
MCHC: 31.5 g/dL (ref 30.0–36.0)
MCV: 92.3 fL (ref 80.0–100.0)
Monocytes Absolute: 0.4 10*3/uL (ref 0.1–1.0)
Monocytes Relative: 11 %
Neutro Abs: 1.4 10*3/uL — ABNORMAL LOW (ref 1.7–7.7)
Neutrophils Relative %: 43 %
Platelets: 182 10*3/uL (ref 150–400)
RBC: 3.64 MIL/uL — ABNORMAL LOW (ref 3.87–5.11)
RDW: 14 % (ref 11.5–15.5)
WBC: 3.3 10*3/uL — ABNORMAL LOW (ref 4.0–10.5)
nRBC: 0 % (ref 0.0–0.2)

## 2021-10-22 LAB — COMPREHENSIVE METABOLIC PANEL
ALT: 20 U/L (ref 0–44)
AST: 28 U/L (ref 15–41)
Albumin: 3.5 g/dL (ref 3.5–5.0)
Alkaline Phosphatase: 98 U/L (ref 38–126)
Anion gap: 9 (ref 5–15)
BUN: 16 mg/dL (ref 8–23)
CO2: 29 mmol/L (ref 22–32)
Calcium: 9.4 mg/dL (ref 8.9–10.3)
Chloride: 101 mmol/L (ref 98–111)
Creatinine, Ser: 0.85 mg/dL (ref 0.44–1.00)
GFR, Estimated: >60 mL/min (ref >60)
Glucose, Bld: 83 mg/dL (ref 70–99)
Potassium: 3.5 mmol/L (ref 3.5–5.1)
Sodium: 139 mmol/L (ref 135–145)
Total Bilirubin: 0.4 mg/dL (ref 0.3–1.2)
Total Protein: 7.2 g/dL (ref 6.5–8.1)

## 2021-10-22 LAB — URINALYSIS, COMPLETE (UACMP) WITH MICROSCOPIC
Bacteria, UA: NONE SEEN
Bilirubin Urine: NEGATIVE
Glucose, UA: NEGATIVE mg/dL
Hgb urine dipstick: NEGATIVE
Ketones, ur: NEGATIVE mg/dL
Leukocytes,Ua: NEGATIVE
Nitrite: NEGATIVE
Protein, ur: NEGATIVE mg/dL
Specific Gravity, Urine: 1.01 (ref 1.005–1.030)
pH: 6 (ref 5.0–8.0)

## 2021-10-22 LAB — PROTEIN, URINE, RANDOM: Total Protein, Urine: <6 mg/dL

## 2021-10-22 MED ORDER — HEPARIN SOD (PORK) LOCK FLUSH 100 UNIT/ML IV SOLN
500.0000 [IU] | Freq: Once | INTRAVENOUS | Status: AC | PRN
  Administered 2021-10-22: 500 [IU]
  Filled 2021-10-22: qty 5

## 2021-10-22 MED ORDER — SODIUM CHLORIDE 0.9 % IV SOLN
INTRAVENOUS | Status: AC
  Filled 2021-10-22 (×2): qty 250

## 2021-10-22 MED ORDER — SODIUM CHLORIDE 0.9% FLUSH
10.0000 mL | INTRAVENOUS | Status: DC | PRN
  Filled 2021-10-22: qty 10

## 2021-10-22 NOTE — Progress Notes (Signed)
Gynecologic Oncology Consult Visit   Referring Provider: Rudene Re, MD Webb Silversmith NP  Chief Concern: Pelvic mass  Subjective:  Chelsea [name] is a 76 y.o. G3P3 female who is seen in consultation from Dr. Alfred Levins, Ms. Mel Almond, and Dr. Janese Banks for possible vaginal cuff biopsy.   Today, she feels weak. Fell yesterday from side of her bed. Was too weak to rise unassisted and EMS was called to assist. She denies injury. She complains of pelvic pain. No vaginal bleeding.   Oncology History:  Patient initially went to emergency room on 03/24/2021 for abdominal pain. 03/24/2021, CT scan of the abdomen showed marked bladder wall thickening with surrounding soft tissue stranding is noted.  Concerning for cystitis.  There is a lobulated mass between the posterior wall of the bladder and the rectum which appears to arise from the vaginal cuff.  This is indeterminate and difficult to characterize reflecting lack of IV contrast material.  Nonobstructing left renal calculus, left sacroiliitis, lumbar spondylosis aortic atherosclerosis. 03/24/2021 CT pelvic showed abdominal [place] soft tissue of right cecum, concerning for colon carcinoma.  Colonoscopy recommended.  Low-attenuation mass in the region of vaginal cuff is again noted.  Diffuse urinary bladder wall thickening and mild bilateral hydroureter possibly cystitis.   [place] 03/24/2021 Lobulated solid 3.8 cm mass confirmed by ultrasound. This is most compatible with a neoplasm but remains indeterminate as to the origin as the epicenter seems to be outside of both the vaginal cuff and the adjacent colon, while the lesion appears inseparable from both. Extensive echogenic debris within the distended urinary bladder.   03/26/2021, patient was seen by Dr. Theora Gianotti for evaluation of colonic/vaginal cuff pelvic mass.  Patient also was referred to gastroenterology for colonoscopy.  Hysterectomy for benign disease. She says she had a BSO and it sounds like she  was menopausal after her surgery.    03/26/2021 CEA 3.4.  CA125 9.7   04/04/2021, status post colonoscopy which showed a frond like /villous nonobstructing large mass was found in the cecum, this was biopsied.  Terminal ileum was briefly intubated and appeared normal.  Diverticulosis in the sigmoid colon.  The rectum, sigmoid colon, descending colon, transverse colon and ascending colon were normal.  Nonbleeding internal hemorrhoids.   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   Given that the biopsy may not be representative of the entire underlying lesion.  Patient was recommended to establish with surgeon for evaluation.   Patient no showed for her surgery appointment and as well as her follow-up appointment with gastroenterology.  Per daughter, patient was having cervical spine surgery and she prioritized that over the colon surgery.   08/19/2021, patient establish care with surgery Dr. Christian Mate   08/21/2021, CT abdomen pelvis with contrast showed soft tissue mass at the base of cecum measuring up to 5 cm, slightly increased in size. Interval development of multiple low-density lesions throughout the liver highly suggestive for metastatic disease.  Complex mass in the region of the vaginal cuff measuring up to 5.2 cm, increased in size.  Right ovary also appears more prominent on the current examination.  Moderate large volume of stool throughout the colon.  Mild urinary bladder wall thickening.  Colonic diverticulosis without evidence of active diverticulitis.  Aortic atherosclerosis   09/04/2021- Underwent biopsy of left liver lobe lesion. Pathology showed metastatic adenocarcinoma, compatible with colorectal primary.   09/15/21- CEA 34.2 (elevated)   She was referred to establish care with oncology.   She was accompanied by her  daughter today.  Patient walks with a walker.+ Night sweats + weight loss.  Denies any abdominal pain currently, melena, blood  in the stool.   She is currently being followed by home health PT/OT following a previous spinal surgery   10/10/20- mediport placement  10/20/2021- initiated chemotherapy with 5-fu, leucovorin and bevacizumab.      Problem List: Patient Active Problem List   Diagnosis Date Noted   Goals of care, counseling/discussion 09/16/2021   Metastatic colon cancer to liver ([place]) 09/16/2021   Dizziness 09/09/2021   Moderate mitral insufficiency 09/09/2021   SOBOE (shortness of breath on exertion) 09/09/2021   Dysplasia-associated lesion or mass ([place]) of colon 04/28/2021   Bilateral lower extremity edema 04/02/2021   History of seizures 04/02/2021   OSA (obstructive sleep apnea) 04/02/2021   Stage 3a chronic kidney disease ([place]) 03/25/2021   Aortic atherosclerosis ([place]) 03/25/2021   Urinary incontinence, mixed 03/25/2021   Class 2 severe obesity due to excess calories with serious comorbidity and body mass index (BMI) of 37.0 to 37.9 in adult [facility]) 03/16/2021   Cervical myelopathy ([place]) 01/28/2021   Mixed incontinence 01/28/2021   Chronic anemia 09/26/2018   Depression, major, recurrent, in partial remission ([place]) 01/11/2018   Pure hypercholesterolemia 01/11/2018   GAD (generalized anxiety disorder) 05/14/2011   Essential hypertension 02/06/2011   Spinal stenosis of lumbosacral region 02/06/2011    Past Medical History: Past Medical History:  Diagnosis Date   Anxiety    Arthritis    Depression    Hyperlipidemia    Hypertension    Liver mass    Metastatic colon cancer to liver ([place])    Port-A-Cath in place    Seizures ([place])    Spinal stenosis    Ulcer    Urinary incontinence     Past Surgical History: Past Surgical History:  Procedure Laterality Date   ABDOMINAL HYSTERECTOMY     BACK SURGERY     due to polio   BREAST BIOPSY Bilateral    neg   BREAST BIOPSY Right 2011   neg/stereo   CARPAL TUNNEL RELEASE     CATARACT EXTRACTION Right 2020   COLONOSCOPY WITH PROPOFOL  N/A 04/04/2021   Procedure: COLONOSCOPY WITH PROPOFOL;  Surgeon: Virgel Manifold, MD;  Location: [facility] ENDOSCOPY;  Service: Endoscopy;  Laterality: N/A;   EYE SURGERY     IR IMAGING GUIDED PORT INSERTION  10/09/2021    Past Gynecologic History:  Post menopausal History of Abnormal pap: no, no abnormalities Last pap:  at birth of her last child History of STDs: The patient denies history of sexually transmitted disease. Contraception: none- post menopausal Sexually active: no  OB History:  G3P3- vaginal deliveries OB History  No obstetric history on file.    Family History: Family History  Problem Relation Age of Onset   Arthritis Mother    Heart disease Mother    Stroke Mother    Hypertension Mother    COPD Mother    Sudden death Sister    Other Father        unknown medical history   Breast cancer Neg Hx     Social History: Social History   Socioeconomic History   Marital status: Divorced    Spouse name: Not on file   Number of children: Not on file   Years of education: Not on file   Highest education level: Not on file  Occupational History   Not on file  Tobacco Use   Smoking status: Never  Smokeless tobacco: Never  Vaping Use   Vaping Use: Never used  Substance and Sexual Activity   Alcohol use: Never   Drug use: No   Sexual activity: Not on file  Other Topics Concern   Not on file  Social History Narrative   Not on file   Social Determinants of Health   Financial Resource Strain: Not on file  Food Insecurity: Not on file  Transportation Needs: Not on file  Physical Activity: Not on file  Stress: Not on file  Social Connections: Not on file  Intimate Partner Violence: Not on file    Allergies: Allergies  Allergen Reactions   Penicillins     "Everything turned black"    Current Medications: Current Outpatient Medications  Medication Sig Dispense Refill   acetaminophen (TYLENOL) 500 MG tablet Take 500 mg by mouth every 6 (six) hours  as needed.     ALPRAZolam (XANAX) 0.5 MG tablet TAKE 1 TABLET BY MOUTH TWICE A DAY AS NEEDED FOR ANXIETY 60 tablet 0   aspirin 325 MG tablet Take 325 mg by mouth daily. (Patient not taking: Reported on 10/20/2021)     busPIRone (BUSPAR) 5 MG tablet Take 1 tablet (5 mg total) by mouth 3 (three) times daily. 270 tablet 1   Cyanocobalamin (VITAMIN B 12 PO) Take 500 mcg by mouth daily.     diclofenac Sodium (VOLTAREN) 1 % GEL Apply 4 g topically 4 (four) times daily. 4 g 0   FLUoxetine (PROZAC) 20 MG capsule TAKE 3 CAPSULES (60 MG TOTAL) BY MOUTH EVERY MORNING. 270 capsule 0   gabapentin (NEURONTIN) 100 MG capsule TAKE 2 CAPSULES BY MOUTH 2 TIMES DAILY. 360 capsule 1   lidocaine (LIDODERM) 5 % Place 1 patch onto the skin daily. Remove & Discard patch within 12 hours or as directed by MD 1 patch 0   lidocaine-prilocaine (EMLA) cream Apply small amount of cream to port site 1-2 hour prior to chemo treatment. 30 g 3   meclizine (ANTIVERT) 12.5 MG tablet Take 1 tablet (12.5 mg total) by mouth 3 (three) times daily as needed for dizziness. (Patient not taking: Reported on 09/11/2021) 30 tablet 0   methocarbamol (ROBAXIN) 500 MG tablet Take 500 mg by mouth every 6 (six) hours as needed for muscle spasms. 1-2 tabs every 6 hours PRN. (Patient not taking: Reported on 08/25/2021)     nitrofurantoin, macrocrystal-monohydrate, (MACROBID) 100 MG capsule Take 1 capsule (100 mg total) by mouth every 12 (twelve) hours. (Patient not taking: Reported on 09/25/2021) 14 capsule 0   ondansetron (ZOFRAN) 8 MG tablet Take 1 tablet (8 mg total) by mouth 2 (two) times daily as needed (Nausea or vomiting). 30 tablet 1   polyethylene glycol powder (GLYCOLAX/MIRALAX) 17 GM/SCOOP powder Take 17 g by mouth daily. 3350 g 1   potassium chloride SA (KLOR-CON M) 20 MEQ tablet Take 1 tablet (20 mEq total) by mouth daily. 30 tablet 0   prochlorperazine (COMPAZINE) 10 MG tablet Take 1 tablet (10 mg total) by mouth every 6 (six) hours as  needed (Nausea or vomiting). 30 tablet 1   triamterene-hydrochlorothiazide (MAXZIDE-25) 37.5-25 MG tablet Take 1 tablet by mouth daily.     vitamin E 180 MG (400 UNITS) capsule Take 400 Units by mouth daily.     No current facility-administered medications for this visit.   Review of Systems General:  fatigue, weakness, fall Skin: no complaints Eyes: no complaints HEENT: no complaints Breasts: no complaints Pulmonary: no complaints Cardiac: no complaints  Gastrointestinal: no complaints Genitourinary/Sexual: urinary frequency & pelvic pain Ob/Gyn: no complaints Musculoskeletal: no complaints Hematology: no complaints Neurologic/Psych: anxiety (took her xanax this morning which helped)   Objective:  Physical Examination:  BP 114/63   Pulse 77   Temp 98.7 F (37.1 C)   Resp 18   Wt 183 lb (83 kg)   SpO2 96%   BMI 39.60 kg/m   Orthostatic BPs Supine 147/76 Sitting 132/94 Standing unable to tolerating standing to obtain BP   ECOG Performance Status: 3 - Symptomatic, >50% confined to bed  GENERAL: lethargic, soft speaking. Laying on bed.  EXTREMITIES:  No peripheral edema. Atraumatic. No cyanosis Abdominal: Soft nondistended and nontender. NEURO:  Nonfocal. Well oriented.  Appropriate affect.  Pelvic: Exam chaperoned by CMA EGBUS: no lesions Cervix: no normal appearing cervix.  Vagina: no lesions, no discharge or bleeding - retraction of the upper vagina but no gross or biopsiable mass. Tenderness with palpation Uterus: surgically absent BME: 3-4 cm vaginal cuff mass smooth surface at the vaginal apex. Nonmobile. Tenderness with palpation. Rectovaginal: confirmatory   Lab Review Labs on site today: Lab Results  Component Value Date   WBC 3.3 (L) 10/22/2021   HGB 10.6 (L) 10/22/2021   HCT 33.6 (L) 10/22/2021   MCV 92.3 10/22/2021   PLT 182 10/22/2021     Chemistry      Component Value Date/Time   NA 139 10/22/2021 0953   K 3.5 10/22/2021 0953   CL 101  10/22/2021 0953   CO2 29 10/22/2021 0953   BUN 16 10/22/2021 0953   CREATININE 0.85 10/22/2021 0953   CREATININE 0.87 08/13/2020 1129      Component Value Date/Time   CALCIUM 9.4 10/22/2021 0953   ALKPHOS 98 10/22/2021 0953   AST 28 10/22/2021 0953   ALT 20 10/22/2021 0953   BILITOT 0.4 10/22/2021 0953     Preliminary urine culture: CULTURE REINCUBATED FOR BETTER GROWTH  60,000 COLONIES/mL ENTEROCOCCUS FAECALIS   Urine: +WBC; many bacteria.   Lipase elevated 78   Radiologic Imaging: As per interval history       Assessment:  WALKER PADDACK is a 76 y.o. female s/p hysterectomy diagnosed with  metastatic adenocarcinoma, compatible with colorectal primary with soft tissue mass at the base of cecum measuring up to 5 cm, multiple low-density lesions throughout the liver highly suggestive for metastatic disease, and complex vaginal mass.   Symptomatic vaginal cuff mass with pelvic pain and reproducible symptoms on exam.   Cholelithiasis  High risk for Fall.   Medical co-morbidities complicating care: h/o seizures, HTN, poor performance status Plan:   Problem List Items Addressed This Visit       Digestive   Metastatic colon cancer to liver [facility]) - Primary   Other Visit Diagnoses     Vaginal mass           The vaginal cuff mass is not amendable to office biopsy with standard techniques. Tru-cut needle biopsy may be able to be performed but would probably require operative intervention. Interventional radiology may be able to biopsy via transvaginal route if additional tissue needed. Another option would be blood-based biopsy with Guardant assay.   May also consider palliative radiation for pelvic pain due to vaginal mass.   Recommended to see Palliative Care/Symptom Management team. Consider IV fluids. Concern that inability to stand my reflect dehydration. She also needs additional evaluation for a Fall work up.   I personally had a face to face interaction  and evaluated  the patient jointly with the NP, Ms. Beckey Rutter.  I have reviewed her history and available records and have performed the key portions of the physical exam including general, HEENT, abdominal exam, pelvic exam with my findings confirming those documented above by the APP.  I have discussed the case with the APP and the patient.  I agree with the above documentation, assessment and plan which was fully formulated by me.  Counseling was completed by me.   I personally saw the patient and performed a substantive portion of this encounter in conjunction with the listed APP as documented above.  Angeles Gaetana Michaelis, MD

## 2021-10-22 NOTE — Progress Notes (Signed)
Symptom Management [place] at [facility] [phone] [phone]) [phone]  Patient Care Team: Jearld Fenton, NP as PCP - General (Internal Medicine) Clent Jacks, RN as Oncology Nurse Navigator   Name of the patient: Chelsea [name]  [id number]  Apr 13, 1946   Date of visit: 10/22/21  Reason for Consult:  Chelsea [name] is a 76 year old woman with multiple medical problems including metastatic colon cancer who was started on chemotherapy with cycle one 5-FU and bevacizumab on 10/20/2021.  She was an add-on to [facility] after being evaluated by GYN and oncology this morning following a "fall" at home.  Patient denies having fallen to me.  She reports that she "slid" out of bed when trying to ambulate to the bathroom.  However, she was unable to get herself up and after about 30 minutes, she activated her life alert systems to call EMS.  EMS came and patient refused to go to the ER. Patient denies any injuries.   Patient reports that she is now feeling better.  She denies any fever or chills.  No cough, shortness of breath.  No nausea, vomiting, or abdominal pain.  She denies dysuria but does report some recent urinary frequency after chemotherapy but this has improved.  Denies any neurologic complaints. Denies recent fevers or illnesses. Denies any easy bleeding or bruising. Reports good appetite and denies weight loss. Denies chest pain. Denies any nausea, vomiting, constipation, or diarrhea. Denies urinary complaints. Patient offers no further specific complaints today.  PAST MEDICAL HISTORY: Past Medical History:  Diagnosis Date   Anxiety    Arthritis    Depression    Hyperlipidemia    Hypertension    Liver mass    Metastatic colon cancer to liver ([place])    Port-A-Cath in place    Seizures ([place])    Spinal stenosis    Ulcer    Urinary incontinence     PAST SURGICAL HISTORY:  Past Surgical History:  Procedure Laterality Date    ABDOMINAL HYSTERECTOMY     BACK SURGERY     due to polio   BREAST BIOPSY Bilateral    neg   BREAST BIOPSY Right 2011   neg/stereo   CARPAL TUNNEL RELEASE     CATARACT EXTRACTION Right 2020   COLONOSCOPY WITH PROPOFOL N/A 04/04/2021   Procedure: COLONOSCOPY WITH PROPOFOL;  Surgeon: Virgel Manifold, MD;  Location: [facility] ENDOSCOPY;  Service: Endoscopy;  Laterality: N/A;   EYE SURGERY     IR IMAGING GUIDED PORT INSERTION  10/09/2021    HEMATOLOGY/ONCOLOGY HISTORY:  Oncology History  Metastatic colon cancer to liver ([place])  09/16/2021 Initial Diagnosis   Metastatic colon cancer to liver ([place])   09/16/2021 Cancer Staging   Staging form: Colon and Rectum - Neuroendocine Tumors, AJCC 8th Edition - Clinical stage from 09/16/2021: Stage IV (cTX, cNX, cM1) - Signed by [name], MD on 09/16/2021 Stage prefix: Initial diagnosis    10/17/2021 -  Chemotherapy   Patient is on Treatment Plan : COLORECTAL 5FU / Leucovorin (Modified deGramont) + Bevacizumab q14d       ALLERGIES:  is allergic to penicillins.  MEDICATIONS:  Current Outpatient Medications  Medication Sig Dispense Refill   acetaminophen (TYLENOL) 500 MG tablet Take 500 mg by mouth every 6 (six) hours as needed.     ALPRAZolam (XANAX) 0.5 MG tablet TAKE 1 TABLET BY MOUTH TWICE A DAY AS NEEDED FOR ANXIETY 60 tablet 0   aspirin 325 MG tablet Take 325 mg  by mouth daily. (Patient not taking: Reported on 10/20/2021)     busPIRone (BUSPAR) 5 MG tablet Take 1 tablet (5 mg total) by mouth 3 (three) times daily. 270 tablet 1   Cyanocobalamin (VITAMIN B 12 PO) Take 500 mcg by mouth daily.     diclofenac Sodium (VOLTAREN) 1 % GEL Apply 4 g topically 4 (four) times daily. 4 g 0   FLUoxetine (PROZAC) 20 MG capsule TAKE 3 CAPSULES (60 MG TOTAL) BY MOUTH EVERY MORNING. 270 capsule 0   gabapentin (NEURONTIN) 100 MG capsule TAKE 2 CAPSULES BY MOUTH 2 TIMES DAILY. 360 capsule 1   lidocaine (LIDODERM) 5 % Place 1 patch onto the skin daily. Remove &  Discard patch within 12 hours or as directed by MD 1 patch 0   lidocaine-prilocaine (EMLA) cream Apply small amount of cream to port site 1-2 hour prior to chemo treatment. 30 g 3   meclizine (ANTIVERT) 12.5 MG tablet Take 1 tablet (12.5 mg total) by mouth 3 (three) times daily as needed for dizziness. (Patient not taking: Reported on 09/11/2021) 30 tablet 0   methocarbamol (ROBAXIN) 500 MG tablet Take 500 mg by mouth every 6 (six) hours as needed for muscle spasms. 1-2 tabs every 6 hours PRN. (Patient not taking: Reported on 08/25/2021)     nitrofurantoin, macrocrystal-monohydrate, (MACROBID) 100 MG capsule Take 1 capsule (100 mg total) by mouth every 12 (twelve) hours. (Patient not taking: Reported on 09/25/2021) 14 capsule 0   ondansetron (ZOFRAN) 8 MG tablet Take 1 tablet (8 mg total) by mouth 2 (two) times daily as needed (Nausea or vomiting). 30 tablet 1   polyethylene glycol powder (GLYCOLAX/MIRALAX) 17 GM/SCOOP powder Take 17 g by mouth daily. 3350 g 1   potassium chloride SA (KLOR-CON M) 20 MEQ tablet Take 1 tablet (20 mEq total) by mouth daily. 30 tablet 0   prochlorperazine (COMPAZINE) 10 MG tablet Take 1 tablet (10 mg total) by mouth every 6 (six) hours as needed (Nausea or vomiting). 30 tablet 1   triamterene-hydrochlorothiazide (MAXZIDE-25) 37.5-25 MG tablet Take 1 tablet by mouth daily.     vitamin E 180 MG (400 UNITS) capsule Take 400 Units by mouth daily.     No current facility-administered medications for this visit.   Facility-Administered Medications Ordered in Other Visits  Medication Dose Route Frequency Provider Last Rate Last Admin   sodium chloride flush (NS) 0.9 % injection 10 mL  10 mL Intracatheter PRN [name], MD        VITAL SIGNS: There were no vitals taken for this visit. There were no vitals filed for this visit.  Estimated body mass index is 39.6 kg/m as calculated from the following:   Height as of 10/09/21: 4\' 9"  (1.448 m).   Weight as of an earlier  encounter on 10/22/21: 183 lb (83 kg).  LABS: CBC:    Component Value Date/Time   WBC 3.3 (L) 10/22/2021 0953   HGB 10.6 (L) 10/22/2021 0953   HCT 33.6 (L) 10/22/2021 0953   PLT 182 10/22/2021 0953   MCV 92.3 10/22/2021 0953   NEUTROABS 1.4 (L) 10/22/2021 0953   LYMPHSABS 1.4 10/22/2021 0953   MONOABS 0.4 10/22/2021 0953   EOSABS 0.1 10/22/2021 0953   BASOSABS 0.0 10/22/2021 0953   Comprehensive Metabolic Panel:    Component Value Date/Time   NA 139 10/22/2021 0953   K 3.5 10/22/2021 0953   CL 101 10/22/2021 0953   CO2 29 10/22/2021 0953   BUN 16 10/22/2021  0539   CREATININE 0.85 10/22/2021 0953   CREATININE 0.87 08/13/2020 1129   GLUCOSE 83 10/22/2021 0953   CALCIUM 9.4 10/22/2021 0953   AST 28 10/22/2021 0953   ALT 20 10/22/2021 0953   ALKPHOS 98 10/22/2021 0953   BILITOT 0.4 10/22/2021 0953   PROT 7.2 10/22/2021 0953   ALBUMIN 3.5 10/22/2021 0953    RADIOGRAPHIC STUDIES: CT CHEST WO CONTRAST  Result Date: 09/23/2021 CLINICAL DATA:  Colon mass EXAM: CT CHEST WITHOUT CONTRAST TECHNIQUE: Multidetector CT imaging of the chest was performed following the standard protocol without IV contrast. COMPARISON:  CT abdomen and pelvis dated August 21, 2021 FINDINGS: Cardiovascular: No pericardial effusion. No significant coronary artery calcifications. Atherosclerotic disease of the thoracic aorta. Mediastinum/Nodes: Calcified lymph nodes of the mediastinum and hilum. No pathologically enlarged lymph nodes seen in the chest. Lungs/Pleura: Central airways are patent. Calcified nodules of the right middle lobe and left lower lobe. Ground-glass opacities of the right upper lobe, lingula and left lower lobe with associated linear opacities that likely represent atelectasis or scarring. Nonspecific small solid pulmonary nodules. Largest is located in the right upper lobe and measures 4 mm on series 2, image 27. Upper Abdomen: New heterogeneous lesion of the left lobe of the liver measuring  5.2 x 4.1 cm. Additional scattered subtle low-attenuation lesions are seen which are similar to slightly increased in size when compared with prior exam. Parenchymal calcifications seen in the bilateral kidneys. No acute abnormality. 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 No aggressive appearing osseous lesions. IMPRESSION: 1. No definite evidence of metastatic disease in the chest. 2. Nonspecific small solid pulmonary nodules. Largest is located in the right upper lobe and measures 4 mm. Recommend attention on follow-up. 3. Ground-glass opacities of the right upper lobe, lingula and left lower lobe with associated linear opacities that likely represent atelectasis or scarring. Findings are likely infectious or inflammatory. Recommend attention on follow-up. 4. New heterogeneous lesion of the left lobe of the liver measuring up to 5.2 cm, likely a hematoma related to recent biopsy. Contrast-enhanced MRI of the liver could be performed if further evaluation is desired. 5. Additional scattered subtle low-attenuation liver lesions are similar to slightly increased in size when compared with prior exam, evaluation is limited due to lack of IV contrast. 6. Aortic Atherosclerosis (ICD10-I70.0). Electronically Signed   By: Yetta Glassman M.D.   On: 09/23/2021 10:02   IR IMAGING GUIDED PORT INSERTION  Result Date: 10/09/2021 INDICATION: History of colorectal cancer, metastatic to the liver EXAM: IMPLANTED PORT A CATH PLACEMENT WITH ULTRASOUND AND FLUOROSCOPIC GUIDANCE MEDICATIONS: None ANESTHESIA/SEDATION: Moderate (conscious) sedation was employed during this procedure. A total of Versed 1 mg and Fentanyl 50 mcg was administered intravenously. Moderate Sedation Time: 19 minutes. The patient's level of consciousness and vital signs were monitored continuously by radiology nursing throughout the procedure under my direct supervision.  FLUOROSCOPY TIME:  1.6 minutes (76.7 mGy) COMPLICATIONS: None immediate. PROCEDURE: Informed written consent was obtained from the patient after a thorough discussion of the procedural risks, benefits and alternatives. All questions were addressed. Maximal Sterile Barrier Technique was utilized including caps, mask, sterile gowns, sterile gloves, sterile drape, hand hygiene and skin antiseptic. A timeout was performed prior to the initiation of the procedure. The patient was placed supine on the exam table. The right neck and chest was prepped and draped in the standard sterile fashion. A preliminary ultrasound of the right neck was performed and  demonstrates a patent right internal jugular vein. A permanent ultrasound image was stored in the electronic medical record. The overlying skin was anesthetized with 1% Lidocaine. Using ultrasound guidance, access was obtained into the right internal jugular vein using a 21 gauge micropuncture set. A wire was advanced into the SVC, a short incision was made at the puncture site, and serial dilatation performed. Next, in an ipsilateral infraclavicular location, an incision was made at the site of the subcutaneous reservoir. Blunt dissection was used to open a pocket to contain the reservoir. A subcutaneous tunnel was then created from the port site to the puncture site. A(n) 8 Fr single lumen catheter was advanced through the tunnel. The catheter was attached to the port and this was placed in the subcutaneous pocket. Under fluoroscopic guidance, a peel away sheath was placed, and the catheter was trimmed to the appropriate length and was advanced into the central veins. The catheter length is 27 cm. The tip of the catheter lies near the superior cavoatrial junction. The port flushes and aspirates appropriately. The port was flushed and locked with heparinized saline. The port pocket was closed in 2 layers using 3-0 and 4-0 Vicryl/absorbable suture. Dermabond was also  applied to both incisions. The patient tolerated the procedure well and was transferred to recovery in stable condition. IMPRESSION: Successful placement of a left chest port via the left internal jugular vein. The port is ready for immediate use. Electronically Signed   By: Albin Felling M.D.   On: 10/09/2021 16:00    PERFORMANCE STATUS (ECOG) : 1 - Symptomatic but completely ambulatory  Review of Systems Unless otherwise noted, a complete review of systems is negative.  Physical Exam General: NAD Cardiovascular: regular rate and rhythm Pulmonary: clear anterior fields Abdomen: soft, nontender, + bowel sounds GU: no suprapubic tenderness Extremities: no edema, no joint deformities Skin: no rashes Neurological: Weakness but otherwise nonfocal  Assessment and Plan- Patient is a 76 y.o. female with metastatic colon cancer who presented to [facility] for evaluation of weakness following recent chemotherapy   Weakness  -labs grossly unremarkable.  Mild neutropenia and anemia following chemotherapy.  We will give IV fluids today in clinic.  Patient feels relatively asymptomatic at present.  We will add on a UA/culture given recent urinary frequency, although patient reports that this is improved.  Additionally, she does have urinary incontinence managed by urology.  I spoke with Gwenette Greet and will have patient see her for weakness. Will also proceed with referral for home health PT as patient lives at [facility].  Patient does have an aide coming daily now.  At baseline, she ambulates with use of a walker.  ER triggers discussed extensively.  Patient RTC as needed.   Patient expressed understanding and was in agreement with this plan. She also understands that She can call clinic at any time with any questions, concerns, or complaints.   Thank you for allowing me to participate in the care of this very pleasant patient.   Time Total: 15 minutes  Visit consisted of counseling and education dealing  with the complex and emotionally intense issues of symptom management in the setting of serious illness.Greater than 50%  of this time was spent counseling and coordinating care related to the above assessment and plan.  Signed by: Altha Harm, PhD, NP-C

## 2021-10-23 ENCOUNTER — Ambulatory Visit: Payer: Medicare HMO | Admitting: Oncology

## 2021-10-23 ENCOUNTER — Other Ambulatory Visit: Payer: Medicare HMO

## 2021-10-23 ENCOUNTER — Ambulatory Visit: Payer: Medicare HMO

## 2021-10-23 LAB — URINE CULTURE: Culture: NO GROWTH

## 2021-10-28 ENCOUNTER — Other Ambulatory Visit: Payer: Self-pay

## 2021-10-28 ENCOUNTER — Ambulatory Visit (INDEPENDENT_AMBULATORY_CARE_PROVIDER_SITE_OTHER): Payer: Medicare HMO | Admitting: Internal Medicine

## 2021-10-28 ENCOUNTER — Encounter: Payer: Self-pay | Admitting: Internal Medicine

## 2021-10-28 VITALS — BP 128/66 | HR 95 | Temp 97.2°F | Ht <= 58 in | Wt 179.0 lb

## 2021-10-28 DIAGNOSIS — Z1159 Encounter for screening for other viral diseases: Secondary | ICD-10-CM | POA: Diagnosis not present

## 2021-10-28 DIAGNOSIS — R7309 Other abnormal glucose: Secondary | ICD-10-CM | POA: Diagnosis not present

## 2021-10-28 DIAGNOSIS — E7800 Pure hypercholesterolemia, unspecified: Secondary | ICD-10-CM

## 2021-10-28 DIAGNOSIS — Z6838 Body mass index (BMI) 38.0-38.9, adult: Secondary | ICD-10-CM

## 2021-10-28 NOTE — Assessment & Plan Note (Signed)
Encourage diet for weight loss 

## 2021-10-28 NOTE — Progress Notes (Signed)
HPI:  Patient presents the clinic today for her subsequent annual Medicare wellness exam.  Past Medical History:  Diagnosis Date   Anxiety    Arthritis    Depression    Hyperlipidemia    Hypertension    Liver mass    Metastatic colon cancer to liver ([place])    Port-A-Cath in place    Seizures ([place])    Spinal stenosis    Ulcer    Urinary incontinence     Current Outpatient Medications  Medication Sig Dispense Refill   acetaminophen (TYLENOL) 500 MG tablet Take 500 mg by mouth every 6 (six) hours as needed.     ALPRAZolam (XANAX) 0.5 MG tablet TAKE 1 TABLET BY MOUTH TWICE A DAY AS NEEDED FOR ANXIETY 60 tablet 0   aspirin 325 MG tablet Take 325 mg by mouth daily. (Patient not taking: Reported on 10/20/2021)     busPIRone (BUSPAR) 5 MG tablet Take 1 tablet (5 mg total) by mouth 3 (three) times daily. 270 tablet 1   Cyanocobalamin (VITAMIN B 12 PO) Take 500 mcg by mouth daily.     diclofenac Sodium (VOLTAREN) 1 % GEL Apply 4 g topically 4 (four) times daily. 4 g 0   FLUoxetine (PROZAC) 20 MG capsule TAKE 3 CAPSULES (60 MG TOTAL) BY MOUTH EVERY MORNING. 270 capsule 0   gabapentin (NEURONTIN) 100 MG capsule TAKE 2 CAPSULES BY MOUTH 2 TIMES DAILY. 360 capsule 1   lidocaine (LIDODERM) 5 % Place 1 patch onto the skin daily. Remove & Discard patch within 12 hours or as directed by MD 1 patch 0   lidocaine-prilocaine (EMLA) cream Apply small amount of cream to port site 1-2 hour prior to chemo treatment. 30 g 3   meclizine (ANTIVERT) 12.5 MG tablet Take 1 tablet (12.5 mg total) by mouth 3 (three) times daily as needed for dizziness. (Patient not taking: Reported on 09/11/2021) 30 tablet 0   methocarbamol (ROBAXIN) 500 MG tablet Take 500 mg by mouth every 6 (six) hours as needed for muscle spasms. 1-2 tabs every 6 hours PRN. (Patient not taking: Reported on 08/25/2021)     nitrofurantoin, macrocrystal-monohydrate, (MACROBID) 100 MG capsule Take 1 capsule (100 mg total) by mouth every 12 (twelve)  hours. (Patient not taking: Reported on 09/25/2021) 14 capsule 0   ondansetron (ZOFRAN) 8 MG tablet Take 1 tablet (8 mg total) by mouth 2 (two) times daily as needed (Nausea or vomiting). 30 tablet 1   polyethylene glycol powder (GLYCOLAX/MIRALAX) 17 GM/SCOOP powder Take 17 g by mouth daily. 3350 g 1   potassium chloride SA (KLOR-CON M) 20 MEQ tablet Take 1 tablet (20 mEq total) by mouth daily. 30 tablet 0   prochlorperazine (COMPAZINE) 10 MG tablet Take 1 tablet (10 mg total) by mouth every 6 (six) hours as needed (Nausea or vomiting). 30 tablet 1   triamterene-hydrochlorothiazide (MAXZIDE-25) 37.5-25 MG tablet Take 1 tablet by mouth daily.     vitamin E 180 MG (400 UNITS) capsule Take 400 Units by mouth daily.     No current facility-administered medications for this visit.    Allergies  Allergen Reactions   Penicillins     "Everything turned black"    Family History  Problem Relation Age of Onset   Arthritis Mother    Heart disease Mother    Stroke Mother    Hypertension Mother    COPD Mother    Sudden death Sister    Other Father  unknown medical history   Breast cancer Neg Hx     Social History   Socioeconomic History   Marital status: Divorced    Spouse name: Not on file   Number of children: Not on file   Years of education: Not on file   Highest education level: Not on file  Occupational History   Not on file  Tobacco Use   Smoking status: Never   Smokeless tobacco: Never  Vaping Use   Vaping Use: Never used  Substance and Sexual Activity   Alcohol use: Never   Drug use: No   Sexual activity: Not on file  Other Topics Concern   Not on file  Social History Narrative   Not on file   Social Determinants of Health   Financial Resource Strain: Not on file  Food Insecurity: Not on file  Transportation Needs: Not on file  Physical Activity: Not on file  Stress: Not on file  Social Connections: Not on file  Intimate Partner Violence: Not on file     Hospitiliaztions: ER visit 06/2021 for cystitis.  Admission 04/2021 for cervical radiculopathy.  ER visit 03/2021 for cystitis.  ER visit 02/2021 for fall.  ER visit 02/2021 for edema.  ER visit 11/2020 for follow-up.  Admission 10/2020 for multiple falls.  Health Maintenance:    Flu: never  Tetanus: > 10 years ago  Pneumovax: never  Prevnar: never  Shingrix: never  Covid: [place] x 3  Mammogram: 01/2021  Pap Smear: hysterectomy  Bone Density: 03/2018  Colon Screening: 04/2021  Eye Doctor: as needed  Dental Exam: as needed   Providers:   PCP: Webb Silversmith, NP  Oncologist: Dr. Theora Gianotti, Dr. Tasia Catchings  Urologist: Dr. Matilde Sprang  General surgery: Dr. Christian Mate  Gastroenterology: Dr. Bonna Gains  Urology: Dr. Cornelia Copa  Neurosurgery: Dr. Izora Ribas  Palliative care: Hanley Ben, NP   I have personally reviewed and have noted:  1. The patient's medical and social history 2. Their use of alcohol, tobacco or illicit drugs 3. Their current medications and supplements 4. The patient's functional ability including ADL's, fall risks, home safety risks and hearing or visual impairment. 5. Diet and physical activities 6. Evidence for depression or mood disorder  Subjective:   Review of Systems:   Constitutional: Denies fever, malaise, fatigue, headache or abrupt weight changes.  HEENT: Denies eye pain, eye redness, ear pain, ringing in the ears, wax buildup, runny nose, nasal congestion, bloody nose, or sore throat. Respiratory: Denies difficulty breathing, shortness of breath, cough or sputum production.   Cardiovascular: Denies chest pain, chest tightness, palpitations or swelling in the hands or feet.  Gastrointestinal: Patient reports intermittent constipation.  Denies abdominal pain, bloating, diarrhea or blood in the stool.  GU: Denies urgency, frequency, pain with urination, burning sensation, blood in urine, odor or discharge. Musculoskeletal: Patient reports difficulty with gait.  Denies  decrease in range of motion, muscle pain or joint pain and swelling.  Skin: Denies redness, rashes, lesions or ulcercations.  Neurological: Patient reports difficulty with memory, numbness and tingling in her right upper extremity, and problems with balance and coordination.  Denies dizziness, difficulty with speech.  Psych: Patient has a history of anxiety.  Denies depression, SI/HI.  No other specific complaints in a complete review of systems (except as listed in HPI above).  Objective:  PE:   BP 128/66   Pulse 95   Temp (!) 97.2 F (36.2 C) (Temporal)   Ht 4\' 9"  (1.448 m)   Wt 179 lb (81.2  kg)   SpO2 100%   BMI 38.74 kg/m   Wt Readings from Last 3 Encounters:  10/22/21 183 lb (83 kg)  10/20/21 183 lb 4.8 oz (83.1 kg)  10/15/21 178 lb (80.7 kg)    General: Appears her stated age, chronically ill-appearing, in NAD. Skin: Warm, dry and intact.  HEENT: Head: normal shape and size; Eyes: EOMs intact;  Neck: Neck supple, trachea midline. No masses, lumps or thyromegaly present.  Cardiovascular: Normal rate and rhythm. S1,S2 noted.  No murmur, rubs or gallops noted. No JVD or BLE edema. No carotid bruits noted. Pulmonary/Chest: Normal effort and positive vesicular breath sounds. No respiratory distress. No wheezes, rales or ronchi noted.  Abdomen: Soft and nontender. Normal bowel sounds.  Musculoskeletal: Strength 5/5 BUE/BLE.  Gait slow and steady with use of rolling walker. Neurological: Alert and oriented.  Difficulty with recall. Psychiatric: Mood and affect normal.  Anxious appearing. Judgment and thought content normal.    BMET    Component Value Date/Time   NA 139 10/22/2021 0953   K 3.5 10/22/2021 0953   CL 101 10/22/2021 0953   CO2 29 10/22/2021 0953   GLUCOSE 83 10/22/2021 0953   BUN 16 10/22/2021 0953   CREATININE 0.85 10/22/2021 0953   CREATININE 0.87 08/13/2020 1129   CALCIUM 9.4 10/22/2021 0953   GFRNONAA >60 10/22/2021 0953   GFRNONAA 65 08/13/2020 1129    GFRAA 76 08/13/2020 1129    Lipid Panel     Component Value Date/Time   CHOL 238 (H) 02/26/2020 1130   TRIG 84 02/26/2020 1130   HDL 60 02/26/2020 1130   CHOLHDL 4.0 02/26/2020 1130   VLDL 18.4 02/08/2013 1638   LDLCALC 159 (H) 02/26/2020 1130    CBC    Component Value Date/Time   WBC 3.3 (L) 10/22/2021 0953   RBC 3.64 (L) 10/22/2021 0953   HGB 10.6 (L) 10/22/2021 0953   HCT 33.6 (L) 10/22/2021 0953   PLT 182 10/22/2021 0953   MCV 92.3 10/22/2021 0953   MCH 29.1 10/22/2021 0953   MCHC 31.5 10/22/2021 0953   RDW 14.0 10/22/2021 0953   LYMPHSABS 1.4 10/22/2021 0953   MONOABS 0.4 10/22/2021 0953   EOSABS 0.1 10/22/2021 0953   BASOSABS 0.0 10/22/2021 0953    Hgb A1C No results found for: HGBA1C    Assessment and Plan:   Medicare Annual Wellness Visit:  Diet: She does eat meat. She consumes fruits and veggies. She tries to avoid fried foods. She drinks juice, water  and soda. Physical activity: Sedentary Depression/mood screen: Chronic, Hearing: Intact to whispered voice Visual acuity: Grossly normal ADLs: Capable Fall risk: Multiple falls Home safety: Good Cognitive evaluation: Trouble with naming and recall. Intact to orientation and repetition EOL planning: Adv directives, full code/ I agree  Preventative Medicine: She declines flu, tetanus, Pneumovax, Prevnar or Shingrix vaccine.  Advised her to get her COVID booster.  She no longer wants to screen for cervical cancer.  Mammogram due 01/2022, she will call to schedule.  Bone density due 2024.  Colon screening UTD.  Encouraged her to consume a balanced diet and exercise regimen.  Advised her to see an eye doctor and dentist annually.  Recent labs reviewed.  Will check lipid, A1c and hep C today.  Due dates for screening exam given to patient as part of her AVS.   Next appointment: 6 months, follow-up chronic conditions   Webb Silversmith, NP This visit occurred during the SARS-CoV-2 public health emergency.   Safety  protocols were in place, including screening questions prior to the visit, additional usage of staff PPE, and extensive cleaning of exam room while observing appropriate contact time as indicated for disinfecting solutions.

## 2021-10-28 NOTE — Patient Instructions (Signed)
Health Maintenance for Postmenopausal Women ?Menopause is a normal process in which your ability to get pregnant comes to an end. This process happens slowly over many months or years, usually between the ages of 48 and 55. Menopause is complete when you have missed your menstrual period for 12 months. ?It is important to talk with your health care provider about some of the most common conditions that affect women after menopause (postmenopausal women). These include heart disease, cancer, and bone loss (osteoporosis). Adopting a healthy lifestyle and getting preventive care can help to promote your health and wellness. The actions you take can also lower your chances of developing some of these common conditions. ?What are the signs and symptoms of menopause? ?During menopause, you may have the following symptoms: ?Hot flashes. These can be moderate or severe. ?Night sweats. ?Decrease in sex drive. ?Mood swings. ?Headaches. ?Tiredness (fatigue). ?Irritability. ?Memory problems. ?Problems falling asleep or staying asleep. ?Talk with your health care provider about treatment options for your symptoms. ?Do I need hormone replacement therapy? ?Hormone replacement therapy is effective in treating symptoms that are caused by menopause, such as hot flashes and night sweats. ?Hormone replacement carries certain risks, especially as you become older. If you are thinking about using estrogen or estrogen with progestin, discuss the benefits and risks with your health care provider. ?How can I reduce my risk for heart disease and stroke? ?The risk of heart disease, heart attack, and stroke increases as you age. One of the causes may be a change in the body's hormones during menopause. This can affect how your body uses dietary fats, triglycerides, and cholesterol. Heart attack and stroke are medical emergencies. There are many things that you can do to help prevent heart disease and stroke. ?Watch your blood pressure ?High  blood pressure causes heart disease and increases the risk of stroke. This is more likely to develop in people who have high blood pressure readings or are overweight. ?Have your blood pressure checked: ?Every 3-5 years if you are 77-39 years of age. ?Every year if you are 77 years old or older. ?Eat a healthy diet ? ?Eat a diet that includes plenty of vegetables, fruits, low-fat dairy products, and lean protein. ?Do not eat a lot of foods that are high in solid fats, added sugars, or sodium. ?Get regular exercise ?Get regular exercise. This is one of the most important things you can do for your health. Most adults should: ?Try to exercise for at least 150 minutes each week. The exercise should increase your heart rate and make you sweat (moderate-intensity exercise). ?Try to do strengthening exercises at least twice each week. Do these in addition to the moderate-intensity exercise. ?Spend less time sitting. Even light physical activity can be beneficial. ?Other tips ?Work with your health care provider to achieve or maintain a healthy weight. ?Do not use any products that contain nicotine or tobacco. These products include cigarettes, chewing tobacco, and vaping devices, such as e-cigarettes. If you need help quitting, ask your health care provider. ?Know your numbers. Ask your health care provider to check your cholesterol and your blood sugar (glucose). Continue to have your blood tested as directed by your health care provider. ?Do I need screening for cancer? ?Depending on your health history and family history, you may need to have cancer screenings at different stages of your life. This may include screening for: ?Breast cancer. ?Cervical cancer. ?Lung cancer. ?Colorectal cancer. ?What is my risk for osteoporosis? ?After menopause, you may be   at increased risk for osteoporosis. Osteoporosis is a condition in which bone destruction happens more quickly than new bone creation. To help prevent osteoporosis or  the bone fractures that can happen because of osteoporosis, you may take the following actions: ?If you are 77-50 years old, get at least 1,000 mg of calcium and at least 600 international units (IU) of vitamin D per day. ?If you are older than age 77 but younger than age 70, get at least 1,200 mg of calcium and at least 600 international units (IU) of vitamin D per day. ?If you are older than age 77, get at least 1,200 mg of calcium and at least 800 international units (IU) of vitamin D per day. ?Smoking and drinking excessive alcohol increase the risk of osteoporosis. Eat foods that are rich in calcium and vitamin D, and do weight-bearing exercises several times each week as directed by your health care provider. ?How does menopause affect my mental health? ?Depression may occur at 77 age, but it is more common as you become older. Common symptoms of depression include: ?Feeling depressed. ?Changes in sleep patterns. ?Changes in appetite or eating patterns. ?Feeling an overall lack of motivation or enjoyment of activities that you previously enjoyed. ?Frequent crying spells. ?Talk with your health care provider if you think that you are experiencing any of these symptoms. ?General instructions ?See your health care provider for regular wellness exams and vaccines. This may include: ?Scheduling regular health, dental, and eye exams. ?Getting and maintaining your vaccines. These include: ?Influenza vaccine. Get this vaccine each year before the flu season begins. ?Pneumonia vaccine. ?Shingles vaccine. ?Tetanus, diphtheria, and pertussis (Tdap) booster vaccine. ?Your health care provider may also recommend other immunizations. ?Tell your health care provider if you have ever been abused or do not feel safe at home. ?Summary ?Menopause is a normal process in which your ability to get pregnant comes to an end. ?This condition causes hot flashes, night sweats, decreased interest in sex, mood swings, headaches, or lack  of sleep. ?Treatment for this condition may include hormone replacement therapy. ?Take actions to keep yourself healthy, including exercising regularly, eating a healthy diet, watching your weight, and checking your blood pressure and blood sugar levels. ?Get screened for cancer and depression. Make sure that you are up to date with all your vaccines. ?This information is not intended to replace advice given to you by your health care provider. Make sure you discuss any questions you have with your health care provider. ?Document Revised: 02/10/2021 Document Reviewed: 02/10/2021 ?Elsevier Patient Education ? 2022 Elsevier Inc. ? ?

## 2021-10-29 ENCOUNTER — Ambulatory Visit: Payer: Medicare HMO

## 2021-10-29 ENCOUNTER — Other Ambulatory Visit: Payer: Self-pay | Admitting: Internal Medicine

## 2021-10-29 ENCOUNTER — Inpatient Hospital Stay: Payer: Medicare HMO

## 2021-10-29 ENCOUNTER — Other Ambulatory Visit: Payer: Medicare HMO

## 2021-10-29 ENCOUNTER — Ambulatory Visit: Payer: Medicare HMO | Admitting: Oncology

## 2021-10-29 ENCOUNTER — Ambulatory Visit: Payer: Medicare HMO | Admitting: Psychologist

## 2021-10-29 ENCOUNTER — Telehealth: Payer: Self-pay | Admitting: *Deleted

## 2021-10-29 ENCOUNTER — Inpatient Hospital Stay (HOSPITAL_BASED_OUTPATIENT_CLINIC_OR_DEPARTMENT_OTHER): Payer: Medicare HMO | Admitting: Oncology

## 2021-10-29 ENCOUNTER — Encounter: Payer: Self-pay | Admitting: Oncology

## 2021-10-29 VITALS — BP 126/66 | HR 73 | Temp 96.8°F | Resp 17

## 2021-10-29 DIAGNOSIS — K521 Toxic gastroenteritis and colitis: Secondary | ICD-10-CM

## 2021-10-29 DIAGNOSIS — E876 Hypokalemia: Secondary | ICD-10-CM | POA: Diagnosis not present

## 2021-10-29 DIAGNOSIS — N898 Other specified noninflammatory disorders of vagina: Secondary | ICD-10-CM | POA: Diagnosis not present

## 2021-10-29 DIAGNOSIS — C787 Secondary malignant neoplasm of liver and intrahepatic bile duct: Secondary | ICD-10-CM

## 2021-10-29 DIAGNOSIS — C189 Malignant neoplasm of colon, unspecified: Secondary | ICD-10-CM

## 2021-10-29 DIAGNOSIS — Z5112 Encounter for antineoplastic immunotherapy: Secondary | ICD-10-CM | POA: Diagnosis not present

## 2021-10-29 DIAGNOSIS — D649 Anemia, unspecified: Secondary | ICD-10-CM

## 2021-10-29 DIAGNOSIS — T451X5A Adverse effect of antineoplastic and immunosuppressive drugs, initial encounter: Secondary | ICD-10-CM

## 2021-10-29 LAB — CBC WITH DIFFERENTIAL/PLATELET
Abs Immature Granulocytes: 0 10*3/uL (ref 0.00–0.07)
Basophils Absolute: 0 10*3/uL (ref 0.0–0.1)
Basophils Relative: 0 %
Eosinophils Absolute: 0.1 10*3/uL (ref 0.0–0.5)
Eosinophils Relative: 3 %
HCT: 34.1 % — ABNORMAL LOW (ref 36.0–46.0)
Hemoglobin: 10.4 g/dL — ABNORMAL LOW (ref 12.0–15.0)
Immature Granulocytes: 0 %
Lymphocytes Relative: 37 %
Lymphs Abs: 1.8 10*3/uL (ref 0.7–4.0)
MCH: 28.7 pg (ref 26.0–34.0)
MCHC: 30.5 g/dL (ref 30.0–36.0)
MCV: 94.2 fL (ref 80.0–100.0)
Monocytes Absolute: 0.3 10*3/uL (ref 0.1–1.0)
Monocytes Relative: 7 %
Neutro Abs: 2.5 10*3/uL (ref 1.7–7.7)
Neutrophils Relative %: 53 %
Platelets: 123 10*3/uL — ABNORMAL LOW (ref 150–400)
RBC: 3.62 MIL/uL — ABNORMAL LOW (ref 3.87–5.11)
RDW: 13.3 % (ref 11.5–15.5)
WBC: 4.7 10*3/uL (ref 4.0–10.5)
nRBC: 0 % (ref 0.0–0.2)

## 2021-10-29 LAB — COMPREHENSIVE METABOLIC PANEL
ALT: 14 U/L (ref 0–44)
AST: 20 U/L (ref 15–41)
Albumin: 3.6 g/dL (ref 3.5–5.0)
Alkaline Phosphatase: 95 U/L (ref 38–126)
Anion gap: 9 (ref 5–15)
BUN: 18 mg/dL (ref 8–23)
CO2: 28 mmol/L (ref 22–32)
Calcium: 9.2 mg/dL (ref 8.9–10.3)
Chloride: 101 mmol/L (ref 98–111)
Creatinine, Ser: 0.87 mg/dL (ref 0.44–1.00)
GFR, Estimated: >60 mL/min (ref >60)
Glucose, Bld: 118 mg/dL — ABNORMAL HIGH (ref 70–99)
Potassium: 3.1 mmol/L — ABNORMAL LOW (ref 3.5–5.1)
Sodium: 138 mmol/L (ref 135–145)
Total Bilirubin: 0.5 mg/dL (ref 0.3–1.2)
Total Protein: 7.3 g/dL (ref 6.5–8.1)

## 2021-10-29 LAB — HEMOGLOBIN A1C
Hgb A1c MFr Bld: 5.6 % of total Hgb (ref <5.7)
Mean Plasma Glucose: 114 mg/dL
eAG (mmol/L): 6.3 mmol/L

## 2021-10-29 LAB — LIPID PANEL
Cholesterol: 231 mg/dL — ABNORMAL HIGH (ref <200)
HDL: 64 mg/dL (ref >=50)
LDL Cholesterol (Calc): 152 mg/dL (calc) — ABNORMAL HIGH
Non-HDL Cholesterol (Calc): 167 mg/dL (calc) — ABNORMAL HIGH (ref <130)
Total CHOL/HDL Ratio: 3.6 (calc) (ref <5.0)
Triglycerides: 62 mg/dL (ref <150)

## 2021-10-29 LAB — HEPATITIS C ANTIBODY
Hepatitis C Ab: NONREACTIVE
SIGNAL TO CUT-OFF: 0.18 (ref <1.00)

## 2021-10-29 MED ORDER — POTASSIUM CHLORIDE 20 MEQ/100ML IV SOLN
20.0000 meq | Freq: Once | INTRAVENOUS | Status: AC
  Administered 2021-10-29: 20 meq via INTRAVENOUS

## 2021-10-29 MED ORDER — LOPERAMIDE HCL 2 MG PO CAPS: 2.0000 mg | ORAL_CAPSULE | ORAL | 0 refills | Status: DC

## 2021-10-29 MED ORDER — SODIUM CHLORIDE 0.9% FLUSH
10.0000 mL | Freq: Once | INTRAVENOUS | Status: AC
  Administered 2021-10-29: 10 mL via INTRAVENOUS
  Filled 2021-10-29: qty 10

## 2021-10-29 MED ORDER — SODIUM CHLORIDE 0.9 % IV SOLN
Freq: Once | INTRAVENOUS | Status: AC
  Filled 2021-10-29: qty 250

## 2021-10-29 MED ORDER — HEPARIN SOD (PORK) LOCK FLUSH 100 UNIT/ML IV SOLN
500.0000 [IU] | Freq: Once | INTRAVENOUS | Status: AC
  Administered 2021-10-29: 500 [IU] via INTRAVENOUS
  Filled 2021-10-29: qty 5

## 2021-10-29 MED ORDER — POTASSIUM CHLORIDE CRYS ER 20 MEQ PO TBCR: 20.0000 meq | EXTENDED_RELEASE_TABLET | Freq: Every day | ORAL | 1 refills | Status: DC

## 2021-10-29 NOTE — Progress Notes (Signed)
Eating and drinking well since her last chemotherapy. Denies any falls. Sleeping well. Only pain pt is having is her neck pain from surgery.

## 2021-10-29 NOTE — Telephone Encounter (Signed)
Requested Prescriptions  Pending Prescriptions Disp Refills  . busPIRone (BUSPAR) 5 MG tablet [Pharmacy Med Name: BUSPIRONE HCL 5 MG TABLET] 270 tablet 1    Sig: TAKE 1 TABLET BY MOUTH THREE TIMES A DAY     Psychiatry: Anxiolytics/Hypnotics - Non-controlled Passed - 10/29/2021 12:17 PM      Passed - Valid encounter within last 6 months    Recent Outpatient Visits          Yesterday Pure hypercholesterolemia   [place], Coralie Keens, NP   1 month ago 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 [facility])   [facility], Coralie Keens, NP   3 months ago [facility] [place], Coralie Keens, NP   5 months ago Cervical myelopathy [facility])   [facility], NP   7 months ago Colonic mass   [facility] [place], Coralie Keens, NP      Future Appointments            In 6 months [place], Coralie Keens, NP [facility], [facility]

## 2021-10-29 NOTE — Progress Notes (Signed)
Hematology/Oncology follow up note [phone] [phone]      Patient Care Team: Jearld Fenton, NP as PCP - General (Internal Medicine) Clent Jacks, RN as Oncology Nurse Navigator  REFERRING PROVIDER: Jearld Fenton, NP  CHIEF COMPLAINTS/REASON FOR VISIT:  metastatic colon cancer  HISTORY OF PRESENTING ILLNESS:   Chelsea [name] is a  76 y.o.  female with PMH listed below was seen in consultation at the request of  Jearld Fenton, NP  for evaluation of metastatic colon cancer.  Patient initially went to emergency room on 03/24/2021 for abdominal pain. 03/24/2021, CT scan of the abdomen showed marked bladder wall thickening with surrounding soft tissue stranding is noted.  Concerning for cystitis.  There is a lobulated mass between the posterior wall of the bladder and the rectum which appears to arise from the vaginal cuff.  This is indeterminate and difficult to characterize reflecting lack of IV contrast material.  Nonobstructing left renal calculus, left sacroiliitis, lumbar spondylosis aortic atherosclerosis. 03/24/2021 CT pelvic showed abdominal [place] soft tissue of right cecum, concerning for colon carcinoma.  Colonoscopy recommended.  Low-attenuation mass in the region of vaginal cuff is again noted.  Diffuse urinary bladder wall thickening and mild bilateral hydroureter possibly cystitis.  [place] 03/24/2021 Lobulated solid 3.8 cm mass confirmed by ultrasound. This is most compatible with a neoplasm but remains indeterminate as to the origin as the epicenter seems to be outside of both the vaginal cuff and the adjacent colon, while the lesion appears inseparable from both. Extensive echogenic debris within the distended urinary bladder.   03/26/2021, patient was seen by Dr. Theora Gianotti for evaluation of colonic/vaginal cuff pelvic mass.  Patient also was referred to gastroenterology for colonoscopy.  03/26/2021 CEA 3.4.  CA125 9.7 04/04/2021, status post  colonoscopy which showed a frond like /villous nonobstructing large mass was found in the cecum, this was biopsied.  Terminal ileum was briefly intubated and appeared normal.  Diverticulosis in the sigmoid colon.  The rectum, sigmoid colon, descending colon, transverse colon and ascending colon were normal.  Nonbleeding internal hemorrhoids. 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    Given that the biopsy may not be representative of the entire underlying lesion.  Patient was recommended to establish with surgeon for evaluation. Patient no showed for her surgery appointment and as well as her follow-up appointment with gastroenterology.  Per daughter, patient was having cervical spine surgery and she prioritized that over the colon surgery.  08/19/2021, patient establish care with surgery Dr. Christian Mate 08/21/2021, CT abdomen pelvis with contrast  showed soft tissue mass at the base of cecum measuring up to 5 cm, slightly increased in size.  Interval development of multiple low-density lesions throughout the liver highly suggestive for metastatic disease.  Complex mass in the region of the vaginal cuff measuring up to 5.2 cm, increased in size.  Right ovary also appears more prominent on the current examination.  Moderate large volume of stool throughout the colon.  Mild urinary bladder wall thickening.  Colonic diverticulosis without evidence of active diverticulitis.  Aortic atherosclerosis  09/04/2021, patient is status post left lobe liver lesion biopsy and pathology showed metastatic adenocarcinoma, compatible with colorectal primary.  She was referred to establish care with oncology. She was accompanied by her daughter today.  Patient walks with a walker.+ Night sweats + weight loss.  Denies any abdominal pain currently, melena, blood in the stool.  She is currently being followed by home health PT/OT following a  previous spinal surgery  10/10/2020, status post  Mediport placement.  INTERVAL HISTORY Chelsea [name] is a 76 y.o. female who has above history reviewed by me today presents for follow up visit for management of metastatic colon cancer [name] 10/20/2020, status post cycle one 5-FU/bevacizumab. Patient reports feeling well.  No nausea vomiting diarrhea.  She has had 1 loose bowel movement per day on several days.   Review of Systems  Constitutional:  Positive for fatigue and unexpected weight change. Negative for appetite change, chills and fever.  HENT:   Negative for hearing loss and voice change.   Eyes:  Negative for eye problems.  Respiratory:  Negative for chest tightness and cough.   Cardiovascular:  Negative for chest pain.  Gastrointestinal:  Negative for abdominal distention, abdominal pain and blood in stool.  Endocrine: Negative for hot flashes.  Genitourinary:  Negative for difficulty urinating and frequency.   Musculoskeletal:  Positive for back pain. Negative for arthralgias.  Skin:  Negative for itching and rash.  Neurological:  Positive for numbness (chornic finger tips). Negative for extremity weakness.  Hematological:  Negative for adenopathy.  Psychiatric/Behavioral:  Negative for confusion.    MEDICAL HISTORY:  Past Medical History:  Diagnosis Date   Anxiety    Arthritis    Depression    Hyperlipidemia    Hypertension    Liver mass    Metastatic colon cancer to liver ([place])    Port-A-Cath in place    Seizures ([place])    Spinal stenosis    Ulcer    Urinary incontinence     SURGICAL HISTORY: Past Surgical History:  Procedure Laterality Date   ABDOMINAL HYSTERECTOMY     BACK SURGERY     due to polio   BREAST BIOPSY Bilateral    neg   BREAST BIOPSY Right 2011   neg/stereo   CARPAL TUNNEL RELEASE     CATARACT EXTRACTION Right 2020   COLONOSCOPY WITH PROPOFOL N/A 04/04/2021   Procedure: COLONOSCOPY WITH PROPOFOL;  Surgeon: Virgel Manifold, MD;  Location:  [facility] ENDOSCOPY;  Service: Endoscopy;  Laterality: N/A;   EYE SURGERY     IR IMAGING GUIDED PORT INSERTION  10/09/2021    SOCIAL HISTORY: Social History   Socioeconomic History   Marital status: Divorced    Spouse name: Not on file   Number of children: Not on file   Years of education: Not on file   Highest education level: Not on file  Occupational History   Not on file  Tobacco Use   Smoking status: Never   Smokeless tobacco: Never  Vaping Use   Vaping Use: Never used  Substance and Sexual Activity   Alcohol use: Never   Drug use: No   Sexual activity: Not on file  Other Topics Concern   Not on file  Social History Narrative   Not on file   Social Determinants of Health   Financial Resource Strain: Not on file  Food Insecurity: Not on file  Transportation Needs: Not on file  Physical Activity: Not on file  Stress: Not on file  Social Connections: Not on file  Intimate Partner Violence: Not on file    FAMILY HISTORY: Family History  Problem Relation Age of Onset   Arthritis Mother    Heart disease Mother    Stroke Mother    Hypertension Mother    COPD Mother    Sudden death Sister    Other Father  unknown medical history   Breast cancer Neg Hx     ALLERGIES:  is allergic to penicillins.  MEDICATIONS:  Current Outpatient Medications  Medication Sig Dispense Refill   acetaminophen (TYLENOL) 500 MG tablet Take 500 mg by mouth every 6 (six) hours as needed.     ALPRAZolam (XANAX) 0.5 MG tablet TAKE 1 TABLET BY MOUTH TWICE A DAY AS NEEDED FOR ANXIETY 60 tablet 0   aspirin 325 MG tablet Take 325 mg by mouth daily.     Cyanocobalamin (VITAMIN B 12 PO) Take 500 mcg by mouth daily.     diclofenac Sodium (VOLTAREN) 1 % GEL Apply 4 g topically 4 (four) times daily. 4 g 0   FLUoxetine (PROZAC) 20 MG capsule TAKE 3 CAPSULES (60 MG TOTAL) BY MOUTH EVERY MORNING. 270 capsule 0   gabapentin (NEURONTIN) 100 MG capsule TAKE 2 CAPSULES BY MOUTH 2 TIMES DAILY. 360  capsule 1   lidocaine (LIDODERM) 5 % Place 1 patch onto the skin daily. Remove & Discard patch within 12 hours or as directed by MD 1 patch 0   lidocaine-prilocaine (EMLA) cream Apply small amount of cream to port site 1-2 hour prior to chemo treatment. 30 g 3   loperamide (IMODIUM) 2 MG capsule Take 1 capsule (2 mg total) by mouth See admin instructions. Initial: 4 mg, followed by 2 mg after each loose stool; maximum: 16 mg/day 60 capsule 0   meclizine (ANTIVERT) 12.5 MG tablet Take 1 tablet (12.5 mg total) by mouth 3 (three) times daily as needed for dizziness. 30 tablet 0   methocarbamol (ROBAXIN) 500 MG tablet Take 500 mg by mouth every 6 (six) hours as needed for muscle spasms. 1-2 tabs every 6 hours PRN.     ondansetron (ZOFRAN) 8 MG tablet Take 1 tablet (8 mg total) by mouth 2 (two) times daily as needed (Nausea or vomiting). 30 tablet 1   polyethylene glycol powder (GLYCOLAX/MIRALAX) 17 GM/SCOOP powder Take 17 g by mouth daily. 3350 g 1   prochlorperazine (COMPAZINE) 10 MG tablet Take 1 tablet (10 mg total) by mouth every 6 (six) hours as needed (Nausea or vomiting). 30 tablet 1   triamterene-hydrochlorothiazide (MAXZIDE-25) 37.5-25 MG tablet Take 1 tablet by mouth daily.     vitamin E 180 MG (400 UNITS) capsule Take 400 Units by mouth daily.     busPIRone (BUSPAR) 5 MG tablet TAKE 1 TABLET BY MOUTH THREE TIMES A DAY 270 tablet 1   potassium chloride SA (KLOR-CON M) 20 MEQ tablet Take 1 tablet (20 mEq total) by mouth daily. 30 tablet 1   No current facility-administered medications for this visit.     PHYSICAL EXAMINATION: ECOG PERFORMANCE STATUS: 2 - Symptomatic, <50% confined to bed Vitals:   10/29/21 0943  BP: 126/66  Pulse: 73  Resp: 17  Temp: (!) 96.8 F (36 C)  SpO2: 99%   There were no vitals filed for this visit.   Physical Exam Constitutional:      General: She is not in acute distress.    Comments: Patient ambulates with a walker  HENT:     Head: Normocephalic  and atraumatic.  Eyes:     General: No scleral icterus. Cardiovascular:     Rate and Rhythm: Normal rate and regular rhythm.     Heart sounds: Normal heart sounds.  Pulmonary:     Effort: Pulmonary effort is normal. No respiratory distress.     Breath sounds: No wheezing.  Abdominal:  General: Bowel sounds are normal. There is no distension.     Palpations: Abdomen is soft.  Musculoskeletal:        General: No deformity. Normal range of motion.     Cervical back: Normal range of motion and neck supple.  Skin:    General: Skin is warm and dry.     Findings: No erythema or rash.  Neurological:     Mental Status: She is alert and oriented to person, place, and time. Mental status is at baseline.     Cranial Nerves: No cranial nerve deficit.     Coordination: Coordination normal.  Psychiatric:        Mood and Affect: Mood normal.    LABORATORY DATA:  I have reviewed the data as listed Lab Results  Component Value Date   WBC 4.7 10/29/2021   HGB 10.4 (L) 10/29/2021   HCT 34.1 (L) 10/29/2021   MCV 94.2 10/29/2021   PLT 123 (L) 10/29/2021   Recent Labs    10/20/21 0806 10/22/21 0953 10/29/21 0912  NA 136 139 138  K 3.6 3.5 3.1*  CL 102 101 101  CO2 29 29 28   GLUCOSE 92 83 118*  BUN 16 16 18   CREATININE 0.80 0.85 0.87  CALCIUM 8.5* 9.4 9.2  GFRNONAA >60 >60 >60  PROT 7.0 7.2 7.3  ALBUMIN 3.3* 3.5 3.6  AST 20 28 20   ALT 15 20 14   ALKPHOS 93 98 95  BILITOT 0.6 0.4 0.5    Iron/TIBC/Ferritin/ %Sat    Component Value Date/Time   FERRITIN 58 10/24/2020 0402       RADIOGRAPHIC STUDIES: I have personally reviewed the radiological images as listed and agreed with the findings in the report. IR IMAGING GUIDED PORT INSERTION  Result Date: 10/09/2021 INDICATION: History of colorectal cancer, metastatic to the liver EXAM: IMPLANTED PORT A CATH PLACEMENT WITH ULTRASOUND AND FLUOROSCOPIC GUIDANCE MEDICATIONS: None ANESTHESIA/SEDATION: Moderate (conscious) sedation was  employed during this procedure. A total of Versed 1 mg and Fentanyl 50 mcg was administered intravenously. Moderate Sedation Time: 19 minutes. The patient's level of consciousness and vital signs were monitored continuously by radiology nursing throughout the procedure under my direct supervision. FLUOROSCOPY TIME:  1.6 minutes (99.3 mGy) COMPLICATIONS: None immediate. PROCEDURE: Informed written consent was obtained from the patient after a thorough discussion of the procedural risks, benefits and alternatives. All questions were addressed. Maximal Sterile Barrier Technique was utilized including caps, mask, sterile gowns, sterile gloves, sterile drape, hand hygiene and skin antiseptic. A timeout was performed prior to the initiation of the procedure. The patient was placed supine on the exam table. The right neck and chest was prepped and draped in the standard sterile fashion. A preliminary ultrasound of the right neck was performed and demonstrates a patent right internal jugular vein. A permanent ultrasound image was stored in the electronic medical record. The overlying skin was anesthetized with 1% Lidocaine. Using ultrasound guidance, access was obtained into the right internal jugular vein using a 21 gauge micropuncture set. A wire was advanced into the SVC, a short incision was made at the puncture site, and serial dilatation performed. Next, in an ipsilateral infraclavicular location, an incision was made at the site of the subcutaneous reservoir. Blunt dissection was used to open a pocket to contain the reservoir. A subcutaneous tunnel was then created from the port site to the puncture site. A(n) 8 Fr single lumen catheter was advanced through the tunnel. The catheter was attached to the port  and this was placed in the subcutaneous pocket. Under fluoroscopic guidance, a peel away sheath was placed, and the catheter was trimmed to the appropriate length and was advanced into the central veins. The  catheter length is 27 cm. The tip of the catheter lies near the superior cavoatrial junction. The port flushes and aspirates appropriately. The port was flushed and locked with heparinized saline. The port pocket was closed in 2 layers using 3-0 and 4-0 Vicryl/absorbable suture. Dermabond was also applied to both incisions. The patient tolerated the procedure well and was transferred to recovery in stable condition. IMPRESSION: Successful placement of a left chest port via the left internal jugular vein. The port is ready for immediate use. Electronically Signed   By: Albin Felling M.D.   On: 10/09/2021 16:00      ASSESSMENT & PLAN:  1. Metastatic colon cancer to liver ([employer])   2. Chemotherapy induced diarrhea   3. Hypokalemia   4. Vaginal mass   5. Normocytic anemia    Cancer Staging  Metastatic colon cancer to liver [facility]) Staging form: Colon and Rectum - Neuroendocine Tumors, AJCC 8th Edition - Clinical stage from 09/16/2021: Stage IV (cTX, cNX, cM1) - Signed by [name], MD on 09/16/2021  #Metastatic colon cancer to liver, 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  No sufficient tissue for NGS Labs reviewed and discussed with patient. She tolerates cycle one 5-FU/bevacizumab. Continue monitor.  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   Patient was seen by Dr. Theora Gianotti.  Biopsy of the vaginal cuff mass may be able to be performed but probably require operative intervention.  I will send  liquid biopsy.  Interventional radiology may be able to biopsy via transvaginal route if additional tissue needed.  I will further discussed with patient.    #Depression, she has psychi referredatry. #Hypokalemia, patient will receive IV potassium chloride 42meq x 1. Recommend patient to continue potassium chloride 79meq daily.  Refills were sent to pharmacy. #Chemotherapy-induced diarrhea.  Mild symptoms.  Recommend Imodium.  And I discussed details of Imodium instructions  with patient.  Prescription sent.  Supportive care measures are necessary for patient well-being and will be provided as necessary.  Orders Placed This Encounter  Procedures   CEA    Standing Status:   Standing    Number of Occurrences:   20    Standing Expiration Date:   10/29/2022    All questions were answered. The patient knows to call the clinic with any problems questions or concerns.  cc Jearld Fenton, NP   Follow-up in 1 week for lab MD 5-FU/bevacizumab.  [name], MD, PhD 10/29/2021

## 2021-10-29 NOTE — Patient Instructions (Signed)
Potassium Chloride Injection °What is this medication? °POTASSIUM CHLORIDE (poe TASS i um KLOOR ide) prevents and treats low levels of potassium in your body. Potassium plays an important role in maintaining the health of your kidneys, heart, muscles, and nervous system. °This medicine may be used for other purposes; ask your health care provider or pharmacist if you have questions. °COMMON BRAND NAME(S): PROAMP °What should I tell my care team before I take this medication? °They need to know if you have any of these conditions: °Addison disease °Dehydration °Diabetes (high blood sugar) °Heart disease °High levels of potassium in the blood °Irregular heartbeat or rhythm °Kidney disease °Large areas of burned skin °An unusual or allergic reaction to potassium, other medications, foods, dyes, or preservatives °Pregnant or trying to get pregnant °Breast-feeding °How should I use this medication? °This medication is injected into a vein. It is given in a hospital or clinic setting. °Talk to your care team about the use of this medication in children. Special care may be needed. °Overdosage: If you think you have taken too much of this medicine contact a poison control center or emergency room at once. °NOTE: This medicine is only for you. Do not share this medicine with others. °What if I miss a dose? °This does not apply. This medication is not for regular use. °What may interact with this medication? °Do not take this medication with any of the following: °Certain diuretics such as spironolactone, triamterene °Eplerenone °Sodium polystyrene sulfonate °This medication may also interact with the following: °Certain medications for blood pressure or heart disease like lisinopril, losartan, quinapril, valsartan °Medications that lower your chance of fighting infection such as cyclosporine, tacrolimus °NSAIDs, medications for pain and inflammation, like ibuprofen or naproxen °Other potassium supplements °Salt  substitutes °This list may not describe all possible interactions. Give your health care provider a list of all the medicines, herbs, non-prescription drugs, or dietary supplements you use. Also tell them if you smoke, drink alcohol, or use illegal drugs. Some items may interact with your medicine. °What should I watch for while using this medication? °Visit your care team for regular checks on your progress. Tell your care team if your symptoms do not start to get better or if they get worse. °You may need blood work while you are taking this medication. °Avoid salt substitutes unless you are told otherwise by your care team. °What side effects may I notice from receiving this medication? °Side effects that you should report to your care team as soon as possible: °Allergic reactions--skin rash, itching, hives, swelling of the face, lips, tongue, or throat °High potassium level--muscle weakness, fast or irregular heartbeat °Side effects that usually do not require medical attention (report to your care team if they continue or are bothersome): °Diarrhea °Nausea °Stomach pain °Vomiting °This list may not describe all possible side effects. Call your doctor for medical advice about side effects. You may report side effects to FDA at 1-800-FDA-1088. °Where should I keep my medication? °This medication is given in a hospital or clinic. It will not be stored at home. °NOTE: This sheet is a summary. It may not cover all possible information. If you have questions about this medicine, talk to your doctor, pharmacist, or health care provider. °© 2022 [employer] (2021-01-07 00:00:00) ° °

## 2021-10-29 NOTE — Telephone Encounter (Signed)
10/29/21-RN Received FMLA papers from patient for her son Chelsea [name]. Son needs an estimated 180 hours of leave for his mother's care.

## 2021-10-30 ENCOUNTER — Other Ambulatory Visit: Payer: Self-pay

## 2021-10-30 DIAGNOSIS — M4807 Spinal stenosis, lumbosacral region: Secondary | ICD-10-CM

## 2021-10-30 MED ORDER — ATORVASTATIN CALCIUM 10 MG PO TABS: 10.0000 mg | ORAL_TABLET | Freq: Every day | ORAL | 2 refills | Status: DC

## 2021-10-30 NOTE — Addendum Note (Signed)
Addended by: Jearld Fenton on: 10/30/2021 12:48 PM   Modules accepted: Orders

## 2021-10-31 ENCOUNTER — Other Ambulatory Visit: Payer: Self-pay | Admitting: Internal Medicine

## 2021-10-31 ENCOUNTER — Encounter: Payer: Self-pay | Admitting: Oncology

## 2021-10-31 MED ORDER — FLUOXETINE HCL 20 MG PO CAPS: 60.0000 mg | ORAL_CAPSULE | Freq: Every morning | ORAL | 0 refills | Status: DC

## 2021-10-31 MED ORDER — METHOCARBAMOL 500 MG PO TABS: 500.0000 mg | ORAL_TABLET | Freq: Four times a day (QID) | ORAL | 0 refills | Status: DC | PRN

## 2021-10-31 MED ORDER — GABAPENTIN 100 MG PO CAPS: ORAL_CAPSULE | ORAL | 0 refills | Status: DC

## 2021-10-31 MED ORDER — TRIAMTERENE-HCTZ 37.5-25 MG PO TABS: 1.0000 | ORAL_TABLET | Freq: Every day | ORAL | 0 refills | Status: DC

## 2021-10-31 MED ORDER — POTASSIUM CHLORIDE CRYS ER 20 MEQ PO TBCR: 20.0000 meq | EXTENDED_RELEASE_TABLET | Freq: Every day | ORAL | 0 refills | Status: DC

## 2021-10-31 MED ORDER — BUSPIRONE HCL 5 MG PO TABS: 5.0000 mg | ORAL_TABLET | Freq: Three times a day (TID) | ORAL | 1 refills | Status: DC

## 2021-10-31 NOTE — Telephone Encounter (Signed)
I personally called patient. Confirmed that her son, who lives in [place] only needed intermittent leave- 2-3 times a week as needed for his mom's fmla.  She knows to ask for the FMLA papers at her apt on Monday 1/30 from DR. Yu's team. She thanked me for calling her and updating her on the FMLA forms.

## 2021-10-31 NOTE — Telephone Encounter (Signed)
FMLA papers completed- pending provider's signature. Family/ Patient wants to pick up the original forms at next visit on Monday if possible.

## 2021-10-31 NOTE — Telephone Encounter (Signed)
Requested Prescriptions  Pending Prescriptions Disp Refills  . busPIRone (BUSPAR) 5 MG tablet 270 tablet 1    Sig: Take 1 tablet (5 mg total) by mouth 3 (three) times daily.     Psychiatry: Anxiolytics/Hypnotics - Non-controlled Passed - 10/31/2021  3:28 PM      Passed - Valid encounter within last 6 months    Recent Outpatient Visits          3 days ago Pure hypercholesterolemia   [place], Coralie Keens, NP   1 month ago 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 [facility])   [facility], Coralie Keens, NP   3 months ago [facility], Coralie Keens, NP   5 months ago Cervical myelopathy [facility])   [facility], NP   7 months ago Colonic mass   [employer], NP      Future Appointments            In 5 months Ba[facility], Coralie Keens, NP [facility], [facility]

## 2021-10-31 NOTE — Telephone Encounter (Signed)
Medication Refill - Medication:busPIRone (BUSPAR) 5 MG tablet   Has the patient contacted their pharmacy? yes (Agent: If no, request that the patient contact the pharmacy for the refill. If patient does not wish to contact the pharmacy document the reason why and proceed with request.) (Agent: If yes, when and what did the pharmacy advise?)contact pcp  Preferred Pharmacy (with phone number or street name):[facility] [id number]  phone [phone] fx [phone] Has the patient been seen for an appointment in the last year OR does the patient have an upcoming appointment? yes  Agent: Please be advised that RX refills may take up to 3 business days. We ask that you follow-up with your pharmacy.

## 2021-11-03 ENCOUNTER — Inpatient Hospital Stay: Payer: Medicare HMO

## 2021-11-03 ENCOUNTER — Telehealth: Payer: Self-pay

## 2021-11-03 ENCOUNTER — Encounter: Payer: Self-pay | Admitting: Oncology

## 2021-11-03 ENCOUNTER — Inpatient Hospital Stay (HOSPITAL_BASED_OUTPATIENT_CLINIC_OR_DEPARTMENT_OTHER): Payer: Medicare HMO | Admitting: Oncology

## 2021-11-03 ENCOUNTER — Other Ambulatory Visit: Payer: Self-pay | Admitting: Internal Medicine

## 2021-11-03 ENCOUNTER — Other Ambulatory Visit: Payer: Self-pay

## 2021-11-03 VITALS — BP 146/86 | HR 73 | Temp 96.1°F | Wt 180.0 lb

## 2021-11-03 DIAGNOSIS — C189 Malignant neoplasm of colon, unspecified: Secondary | ICD-10-CM

## 2021-11-03 DIAGNOSIS — F411 Generalized anxiety disorder: Secondary | ICD-10-CM

## 2021-11-03 DIAGNOSIS — R7989 Other specified abnormal findings of blood chemistry: Secondary | ICD-10-CM | POA: Diagnosis not present

## 2021-11-03 DIAGNOSIS — T451X5A Adverse effect of antineoplastic and immunosuppressive drugs, initial encounter: Secondary | ICD-10-CM

## 2021-11-03 DIAGNOSIS — Z5111 Encounter for antineoplastic chemotherapy: Secondary | ICD-10-CM

## 2021-11-03 DIAGNOSIS — K521 Toxic gastroenteritis and colitis: Secondary | ICD-10-CM

## 2021-11-03 DIAGNOSIS — C787 Secondary malignant neoplasm of liver and intrahepatic bile duct: Secondary | ICD-10-CM

## 2021-11-03 DIAGNOSIS — E876 Hypokalemia: Secondary | ICD-10-CM | POA: Diagnosis not present

## 2021-11-03 DIAGNOSIS — Z5112 Encounter for antineoplastic immunotherapy: Secondary | ICD-10-CM | POA: Diagnosis not present

## 2021-11-03 LAB — COMPREHENSIVE METABOLIC PANEL
ALT: 14 U/L (ref 0–44)
AST: 21 U/L (ref 15–41)
Albumin: 3.4 g/dL — ABNORMAL LOW (ref 3.5–5.0)
Alkaline Phosphatase: 86 U/L (ref 38–126)
Anion gap: 7 (ref 5–15)
BUN: 20 mg/dL (ref 8–23)
CO2: 31 mmol/L (ref 22–32)
Calcium: 9 mg/dL (ref 8.9–10.3)
Chloride: 98 mmol/L (ref 98–111)
Creatinine, Ser: 1.07 mg/dL — ABNORMAL HIGH (ref 0.44–1.00)
GFR, Estimated: 54 mL/min — ABNORMAL LOW (ref >60)
Glucose, Bld: 108 mg/dL — ABNORMAL HIGH (ref 70–99)
Potassium: 3.4 mmol/L — ABNORMAL LOW (ref 3.5–5.1)
Sodium: 136 mmol/L (ref 135–145)
Total Bilirubin: 0.6 mg/dL (ref 0.3–1.2)
Total Protein: 7.2 g/dL (ref 6.5–8.1)

## 2021-11-03 LAB — CBC WITH DIFFERENTIAL/PLATELET
Abs Immature Granulocytes: 0 10*3/uL (ref 0.00–0.07)
Basophils Absolute: 0 10*3/uL (ref 0.0–0.1)
Basophils Relative: 0 %
Eosinophils Absolute: 0.1 10*3/uL (ref 0.0–0.5)
Eosinophils Relative: 3 %
HCT: 33.8 % — ABNORMAL LOW (ref 36.0–46.0)
Hemoglobin: 10.6 g/dL — ABNORMAL LOW (ref 12.0–15.0)
Immature Granulocytes: 0 %
Lymphocytes Relative: 50 %
Lymphs Abs: 1.8 10*3/uL (ref 0.7–4.0)
MCH: 29.4 pg (ref 26.0–34.0)
MCHC: 31.4 g/dL (ref 30.0–36.0)
MCV: 93.6 fL (ref 80.0–100.0)
Monocytes Absolute: 0.4 10*3/uL (ref 0.1–1.0)
Monocytes Relative: 12 %
Neutro Abs: 1.2 10*3/uL — ABNORMAL LOW (ref 1.7–7.7)
Neutrophils Relative %: 35 %
Platelets: 123 10*3/uL — ABNORMAL LOW (ref 150–400)
RBC: 3.61 MIL/uL — ABNORMAL LOW (ref 3.87–5.11)
RDW: 13.4 % (ref 11.5–15.5)
WBC: 3.5 10*3/uL — ABNORMAL LOW (ref 4.0–10.5)
nRBC: 0 % (ref 0.0–0.2)

## 2021-11-03 LAB — PROTEIN, URINE, RANDOM: Total Protein, Urine: <6 mg/dL

## 2021-11-03 MED ORDER — SODIUM CHLORIDE 0.9 % IV SOLN
5.0000 mg/kg | Freq: Once | INTRAVENOUS | Status: AC
  Administered 2021-11-03: 400 mg via INTRAVENOUS
  Filled 2021-11-03: qty 16

## 2021-11-03 MED ORDER — SODIUM CHLORIDE 0.9% FLUSH
10.0000 mL | INTRAVENOUS | Status: DC | PRN
  Administered 2021-11-03: 10 mL via INTRAVENOUS
  Filled 2021-11-03: qty 10

## 2021-11-03 MED ORDER — SODIUM CHLORIDE 0.9 % IV SOLN
700.0000 mg | Freq: Once | INTRAVENOUS | Status: AC
  Administered 2021-11-03: 700 mg via INTRAVENOUS
  Filled 2021-11-03: qty 35

## 2021-11-03 MED ORDER — FLUOROURACIL CHEMO INJECTION 2.5 GM/50ML
400.0000 mg/m2 | Freq: Once | INTRAVENOUS | Status: AC
  Administered 2021-11-03: 700 mg via INTRAVENOUS
  Filled 2021-11-03: qty 14

## 2021-11-03 MED ORDER — SODIUM CHLORIDE 0.9 % IV SOLN
Freq: Once | INTRAVENOUS | Status: AC
  Filled 2021-11-03: qty 250

## 2021-11-03 MED ORDER — ACETAMINOPHEN 325 MG PO TABS
650.0000 mg | ORAL_TABLET | Freq: Once | ORAL | Status: AC
  Administered 2021-11-03: 650 mg via ORAL
  Filled 2021-11-03: qty 2

## 2021-11-03 MED ORDER — SODIUM CHLORIDE 0.9 % IV SOLN
2400.0000 mg/m2 | INTRAVENOUS | Status: DC
  Administered 2021-11-03: 4300 mg via INTRAVENOUS
  Filled 2021-11-03: qty 86

## 2021-11-03 MED ORDER — PROCHLORPERAZINE MALEATE 10 MG PO TABS
10.0000 mg | ORAL_TABLET | Freq: Once | ORAL | Status: AC
  Administered 2021-11-03: 10 mg via ORAL
  Filled 2021-11-03: qty 1

## 2021-11-03 MED ORDER — POTASSIUM CHLORIDE IN NACL 20-0.9 MEQ/L-% IV SOLN
Freq: Once | INTRAVENOUS | Status: AC
  Filled 2021-11-03: qty 1000

## 2021-11-03 NOTE — Patient Instructions (Signed)
[facility] [place]  Discharge Instructions: Thank you for choosing [facility] to provide your oncology and hematology care.  If you have a lab appointment with the [facility], please go directly to the [facility] and check in at the registration area.  Wear comfortable clothing and clothing appropriate for easy access to any Portacath or PICC line.   We strive to give you quality time with your provider. You may need to reschedule your appointment if you arrive late (15 or more minutes).  Arriving late affects you and other patients whose appointments are after yours.  Also, if you miss three or more appointments without notifying the office, you may be dismissed from the clinic at the provider's discretion.      For prescription refill requests, have your pharmacy contact our office and allow 72 hours for refills to be completed.    Today you received the following chemotherapy and/or immunotherapy agents Zirabev and Adrucil       To help prevent nausea and vomiting after your treatment, we encourage you to take your nausea medication as directed.  BELOW ARE SYMPTOMS THAT SHOULD BE REPORTED IMMEDIATELY: *FEVER GREATER THAN 100.4 F (38 C) OR HIGHER *CHILLS OR SWEATING *NAUSEA AND VOMITING THAT IS NOT CONTROLLED WITH YOUR NAUSEA MEDICATION *UNUSUAL SHORTNESS OF BREATH *UNUSUAL BRUISING OR BLEEDING *URINARY PROBLEMS (pain or burning when urinating, or frequent urination) *BOWEL PROBLEMS (unusual diarrhea, constipation, pain near the anus) TENDERNESS IN MOUTH AND THROAT WITH OR WITHOUT PRESENCE OF ULCERS (sore throat, sores in mouth, or a toothache) UNUSUAL RASH, SWELLING OR PAIN  UNUSUAL VAGINAL DISCHARGE OR ITCHING   Items with * indicate a potential emergency and should be followed up as soon as possible or go to the Emergency Department if any problems should occur.  Please show the CHEMOTHERAPY ALERT CARD or IMMUNOTHERAPY ALERT CARD at  check-in to the Emergency Department and triage nurse.  Should you have questions after your visit or need to cancel or reschedule your appointment, please contact [facility] [place] [facility] [place]  [phone] and follow the prompts.  Office hours are 8:00 a.m. to 4:30 p.m. Monday - Friday. Please note that voicemails left after 4:00 p.m. may not be returned until the following business day.  We are closed weekends and major holidays. You have access to a nurse at all times for urgent questions. Please call the main number to the clinic [phone] and follow the prompts.  For any non-urgent questions, you may also contact your provider using MyChart. We now offer e-Visits for anyone 93 and older to request care online for non-urgent symptoms. For details visit [email or web address].   Also download the MyChart app! Go to the app store, search "MyChart", open the app, select [facility], and log in with your MyChart username and password.  Due to Covid, a mask is required upon entering the hospital/clinic. If you do not have a mask, one will be given to you upon arrival. For doctor visits, patients may have 1 support person aged 65 or older with them. For treatment visits, patients cannot have anyone with them due to current Covid guidelines and our immunocompromised population.

## 2021-11-03 NOTE — Progress Notes (Signed)
Hematology/Oncology follow up note [phone] [phone]      Patient Care Team: Jearld Fenton, NP as PCP - General (Internal Medicine) Clent Jacks, RN as Oncology Nurse Navigator [name], MD as Consulting Physician (Oncology)  REFERRING PROVIDER: Jearld Fenton, NP  CHIEF COMPLAINTS/REASON FOR VISIT:  metastatic colon cancer  HISTORY OF PRESENTING ILLNESS:   Chelsea [name] is a  76 y.o.  female with PMH listed below was seen in consultation at the request of  Jearld Fenton, NP  for evaluation of metastatic colon cancer.  Patient initially went to emergency room on 03/24/2021 for abdominal pain. 03/24/2021, CT scan of the abdomen showed marked bladder wall thickening with surrounding soft tissue stranding is noted.  Concerning for cystitis.  There is a lobulated mass between the posterior wall of the bladder and the rectum which appears to arise from the vaginal cuff.  This is indeterminate and difficult to characterize reflecting lack of IV contrast material.  Nonobstructing left renal calculus, left sacroiliitis, lumbar spondylosis aortic atherosclerosis. 03/24/2021 CT pelvic showed abdominal [place] soft tissue of right cecum, concerning for colon carcinoma.  Colonoscopy recommended.  Low-attenuation mass in the region of vaginal cuff is again noted.  Diffuse urinary bladder wall thickening and mild bilateral hydroureter possibly cystitis.  [place] 03/24/2021 Lobulated solid 3.8 cm mass confirmed by ultrasound. This is most compatible with a neoplasm but remains indeterminate as to the origin as the epicenter seems to be outside of both the vaginal cuff and the adjacent colon, while the lesion appears inseparable from both. Extensive echogenic debris within the distended urinary bladder.   03/26/2021, patient was seen by Dr. Theora Gianotti for evaluation of colonic/vaginal cuff pelvic mass.  Patient also was referred to gastroenterology for  colonoscopy.  03/26/2021 CEA 3.4.  CA125 9.7 04/04/2021, status post colonoscopy which showed a frond like /villous nonobstructing large mass was found in the cecum, this was biopsied.  Terminal ileum was briefly intubated and appeared normal.  Diverticulosis in the sigmoid colon.  The rectum, sigmoid colon, descending colon, transverse colon and ascending colon were normal.  Nonbleeding internal hemorrhoids. 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    Given that the biopsy may not be representative of the entire underlying lesion.  Patient was recommended to establish with surgeon for evaluation. Patient no showed for her surgery appointment and as well as her follow-up appointment with gastroenterology.  Per daughter, patient was having cervical spine surgery and she prioritized that over the colon surgery.  08/19/2021, patient establish care with surgery Dr. Christian Mate 08/21/2021, CT abdomen pelvis with contrast  showed soft tissue mass at the base of cecum measuring up to 5 cm, slightly increased in size.  Interval development of multiple low-density lesions throughout the liver highly suggestive for metastatic disease.  Complex mass in the region of the vaginal cuff measuring up to 5.2 cm, increased in size.  Right ovary also appears more prominent on the current examination.  Moderate large volume of stool throughout the colon.  Mild urinary bladder wall thickening.  Colonic diverticulosis without evidence of active diverticulitis.  Aortic atherosclerosis  09/04/2021, patient is status post left lobe liver lesion biopsy and pathology showed metastatic adenocarcinoma, compatible with colorectal primary.  She was referred to establish care with oncology. She was accompanied by her daughter today.  Patient walks with a walker.+ Night sweats + weight loss.  Denies any abdominal pain currently, melena, blood in the stool.  She is currently being  followed by home health  PT/OT following a previous spinal surgery  10/10/2020, status post Mediport placement. 10/20/2020, cycle one 5-FU/bevacizumab.  INTERVAL HISTORY Chelsea [name] is a 75 y.o. female who has above history reviewed by me today presents for follow up visit for management of metastatic colon cancer Patient was accompanied by her caregiver. Overall patient has been doing well.  No nausea vomiting. Patient reports loose bowel movement once every other day. She is not sure if she has taken Imodium.   Review of Systems  Constitutional:  Positive for fatigue and unexpected weight change. Negative for appetite change, chills and fever.  HENT:   Negative for hearing loss and voice change.   Eyes:  Negative for eye problems.  Respiratory:  Negative for chest tightness and cough.   Cardiovascular:  Negative for chest pain.  Gastrointestinal:  Positive for diarrhea. Negative for abdominal distention, abdominal pain and blood in stool.  Endocrine: Negative for hot flashes.  Genitourinary:  Negative for difficulty urinating and frequency.   Musculoskeletal:  Positive for back pain. Negative for arthralgias.  Skin:  Negative for itching and rash.  Neurological:  Positive for numbness (chornic finger tips). Negative for extremity weakness.  Hematological:  Negative for adenopathy.  Psychiatric/Behavioral:  Negative for confusion.    MEDICAL HISTORY:  Past Medical History:  Diagnosis Date   Anxiety    Arthritis    Depression    Hyperlipidemia    Hypertension    Liver mass    Metastatic colon cancer to liver ([place])    Port-A-Cath in place    Seizures ([place])    Spinal stenosis    Ulcer    Urinary incontinence     SURGICAL HISTORY: Past Surgical History:  Procedure Laterality Date   ABDOMINAL HYSTERECTOMY     BACK SURGERY     due to polio   BREAST BIOPSY Bilateral    neg   BREAST BIOPSY Right 2011   neg/stereo   CARPAL TUNNEL RELEASE     CATARACT EXTRACTION Right 2020   COLONOSCOPY  WITH PROPOFOL N/A 04/04/2021   Procedure: COLONOSCOPY WITH PROPOFOL;  Surgeon: Virgel Manifold, MD;  Location: [facility] ENDOSCOPY;  Service: Endoscopy;  Laterality: N/A;   EYE SURGERY     IR IMAGING GUIDED PORT INSERTION  10/09/2021    SOCIAL HISTORY: Social History   Socioeconomic History   Marital status: Divorced    Spouse name: Not on file   Number of children: Not on file   Years of education: Not on file   Highest education level: Not on file  Occupational History   Not on file  Tobacco Use   Smoking status: Never   Smokeless tobacco: Never  Vaping Use   Vaping Use: Never used  Substance and Sexual Activity   Alcohol use: Never   Drug use: No   Sexual activity: Not on file  Other Topics Concern   Not on file  Social History Narrative   Not on file   Social Determinants of Health   Financial Resource Strain: Not on file  Food Insecurity: Not on file  Transportation Needs: Not on file  Physical Activity: Not on file  Stress: Not on file  Social Connections: Not on file  Intimate Partner Violence: Not on file    FAMILY HISTORY: Family History  Problem Relation Age of Onset   Arthritis Mother    Heart disease Mother    Stroke Mother    Hypertension Mother    COPD Mother  Sudden death Sister    Other Father        unknown medical history   Breast cancer Neg Hx     ALLERGIES:  is allergic to penicillins.  MEDICATIONS:  Current Outpatient Medications  Medication Sig Dispense Refill   acetaminophen (TYLENOL) 500 MG tablet Take 500 mg by mouth every 6 (six) hours as needed.     ALPRAZolam (XANAX) 0.5 MG tablet TAKE 1 TABLET BY MOUTH TWICE A DAY AS NEEDED FOR ANXIETY 60 tablet 0   aspirin 325 MG tablet Take 325 mg by mouth daily.     atorvastatin (LIPITOR) 10 MG tablet Take 1 tablet (10 mg total) by mouth daily. 30 tablet 2   busPIRone (BUSPAR) 5 MG tablet Take 1 tablet (5 mg total) by mouth 3 (three) times daily. 270 tablet 1   Cyanocobalamin (VITAMIN B  12 PO) Take 500 mcg by mouth daily.     diclofenac Sodium (VOLTAREN) 1 % GEL Apply 4 g topically 4 (four) times daily. 4 g 0   FLUoxetine (PROZAC) 20 MG capsule Take 3 capsules (60 mg total) by mouth every morning. 270 capsule 0   gabapentin (NEURONTIN) 100 MG capsule TAKE 2 CAPSULES BY MOUTH 2 TIMES DAILY. 360 capsule 0   lidocaine (LIDODERM) 5 % Place 1 patch onto the skin daily. Remove & Discard patch within 12 hours or as directed by MD 1 patch 0   lidocaine-prilocaine (EMLA) cream Apply small amount of cream to port site 1-2 hour prior to chemo treatment. 30 g 3   loperamide (IMODIUM) 2 MG capsule Take 1 capsule (2 mg total) by mouth See admin instructions. Initial: 4 mg, followed by 2 mg after each loose stool; maximum: 16 mg/day 60 capsule 0   meclizine (ANTIVERT) 12.5 MG tablet Take 1 tablet (12.5 mg total) by mouth 3 (three) times daily as needed for dizziness. 30 tablet 0   methocarbamol (ROBAXIN) 500 MG tablet Take 1 tablet (500 mg total) by mouth every 6 (six) hours as needed for muscle spasms. 1-2 tabs every 6 hours PRN. 60 tablet 0   ondansetron (ZOFRAN) 8 MG tablet Take 1 tablet (8 mg total) by mouth 2 (two) times daily as needed (Nausea or vomiting). 30 tablet 1   polyethylene glycol powder (GLYCOLAX/MIRALAX) 17 GM/SCOOP powder Take 17 g by mouth daily. 3350 g 1   potassium chloride SA (KLOR-CON M) 20 MEQ tablet Take 1 tablet (20 mEq total) by mouth daily. 90 tablet 0   prochlorperazine (COMPAZINE) 10 MG tablet Take 1 tablet (10 mg total) by mouth every 6 (six) hours as needed (Nausea or vomiting). 30 tablet 1   triamterene-hydrochlorothiazide (MAXZIDE-25) 37.5-25 MG tablet Take 1 tablet by mouth daily. 90 tablet 0   vitamin E 180 MG (400 UNITS) capsule Take 400 Units by mouth daily.     No current facility-administered medications for this visit.     PHYSICAL EXAMINATION: ECOG PERFORMANCE STATUS: 2 - Symptomatic, <50% confined to bed Vitals:   11/03/21 0848  BP: (!) 146/86   Pulse: 73  Temp: (!) 96.1 F (35.6 C)   Filed Weights   11/03/21 0848  Weight: 180 lb (81.6 kg)     Physical Exam Constitutional:      General: She is not in acute distress.    Comments: Patient ambulates with a walker  HENT:     Head: Normocephalic and atraumatic.  Eyes:     General: No scleral icterus. Cardiovascular:     Rate and  Rhythm: Normal rate and regular rhythm.     Heart sounds: Normal heart sounds.  Pulmonary:     Effort: Pulmonary effort is normal. No respiratory distress.     Breath sounds: No wheezing.  Abdominal:     General: Bowel sounds are normal. There is no distension.     Palpations: Abdomen is soft.  Musculoskeletal:        General: No deformity. Normal range of motion.     Cervical back: Normal range of motion and neck supple.  Skin:    General: Skin is warm and dry.     Findings: No erythema or rash.  Neurological:     Mental Status: She is alert and oriented to person, place, and time. Mental status is at baseline.     Cranial Nerves: No cranial nerve deficit.     Coordination: Coordination normal.  Psychiatric:        Mood and Affect: Mood normal.    LABORATORY DATA:  I have reviewed the data as listed Lab Results  Component Value Date   WBC 3.5 (L) 11/03/2021   HGB 10.6 (L) 11/03/2021   HCT 33.8 (L) 11/03/2021   MCV 93.6 11/03/2021   PLT 123 (L) 11/03/2021   Recent Labs    10/22/21 0953 10/29/21 0912 11/03/21 0830  NA 139 138 136  K 3.5 3.1* 3.4*  CL 101 101 98  CO2 29 28 31   GLUCOSE 83 118* 108*  BUN 16 18 20   CREATININE 0.85 0.87 1.07*  CALCIUM 9.4 9.2 9.0  GFRNONAA >60 >60 54*  PROT 7.2 7.3 7.2  ALBUMIN 3.5 3.6 3.4*  AST 28 20 21   ALT 20 14 14   ALKPHOS 98 95 86  BILITOT 0.4 0.5 0.6    Iron/TIBC/Ferritin/ %Sat    Component Value Date/Time   FERRITIN 58 10/24/2020 0402       RADIOGRAPHIC STUDIES: I have personally reviewed the radiological images as listed and agreed with the findings in the report. IR  IMAGING GUIDED PORT INSERTION  Result Date: 10/09/2021 INDICATION: History of colorectal cancer, metastatic to the liver EXAM: IMPLANTED PORT A CATH PLACEMENT WITH ULTRASOUND AND FLUOROSCOPIC GUIDANCE MEDICATIONS: None ANESTHESIA/SEDATION: Moderate (conscious) sedation was employed during this procedure. A total of Versed 1 mg and Fentanyl 50 mcg was administered intravenously. Moderate Sedation Time: 19 minutes. The patient's level of consciousness and vital signs were monitored continuously by radiology nursing throughout the procedure under my direct supervision. FLUOROSCOPY TIME:  1.6 minutes (22.0 mGy) COMPLICATIONS: None immediate. PROCEDURE: Informed written consent was obtained from the patient after a thorough discussion of the procedural risks, benefits and alternatives. All questions were addressed. Maximal Sterile Barrier Technique was utilized including caps, mask, sterile gowns, sterile gloves, sterile drape, hand hygiene and skin antiseptic. A timeout was performed prior to the initiation of the procedure. The patient was placed supine on the exam table. The right neck and chest was prepped and draped in the standard sterile fashion. A preliminary ultrasound of the right neck was performed and demonstrates a patent right internal jugular vein. A permanent ultrasound image was stored in the electronic medical record. The overlying skin was anesthetized with 1% Lidocaine. Using ultrasound guidance, access was obtained into the right internal jugular vein using a 21 gauge micropuncture set. A wire was advanced into the SVC, a short incision was made at the puncture site, and serial dilatation performed. Next, in an ipsilateral infraclavicular location, an incision was made at the site of the subcutaneous reservoir.  Blunt dissection was used to open a pocket to contain the reservoir. A subcutaneous tunnel was then created from the port site to the puncture site. A(n) 8 Fr single lumen catheter was  advanced through the tunnel. The catheter was attached to the port and this was placed in the subcutaneous pocket. Under fluoroscopic guidance, a peel away sheath was placed, and the catheter was trimmed to the appropriate length and was advanced into the central veins. The catheter length is 27 cm. The tip of the catheter lies near the superior cavoatrial junction. The port flushes and aspirates appropriately. The port was flushed and locked with heparinized saline. The port pocket was closed in 2 layers using 3-0 and 4-0 Vicryl/absorbable suture. Dermabond was also applied to both incisions. The patient tolerated the procedure well and was transferred to recovery in stable condition. IMPRESSION: Successful placement of a left chest port via the left internal jugular vein. The port is ready for immediate use. Electronically Signed   By: Albin Felling M.D.   On: 10/09/2021 16:00      ASSESSMENT & PLAN:  1. Metastatic colon cancer to liver ([place])   2. Chemotherapy induced diarrhea   3. Hypokalemia   4. Encounter for antineoplastic chemotherapy   5. Elevated serum creatinine    Cancer Staging  Metastatic colon cancer to liver [facility]) Staging form: Colon and Rectum - Neuroendocine Tumors, AJCC 8th Edition - Clinical stage from 09/16/2021: Stage IV (cTX, cNX, cM1) - Signed by [name], MD on 09/16/2021  #Metastatic colon cancer to liver, 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  No sufficient tissue for NGS Labs reviewed and discussed with patient Proceed with cycle two 5-FU/bevacizumab.  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   Patient was seen by Dr. Theora Gianotti.  Biopsy of the vaginal cuff mass may be able to be performed but probably require operative intervention.  I will send  liquid biopsy.  Interventional radiology may be able to biopsy via transvaginal route if additional tissue needed-we will further discuss after the liquid biopsy is back.  #Depression,  psychiatry referral #Hypokalemia, improved, continue potassium chloride 14meq daily.  IV potassium chloride29meq x1 #Chemotherapy-induced diarrhea.  Mild symptoms.  Recommend Imodium as needed as instructed. Patient appears to have difficulty managing her medication.  Will refer home health for medication management. #Mildly elevated creatinine, patient will receive IV fluid Liter of normal saline x1 today.  Supportive care measures are necessary for patient well-being and will be provided as necessary. \All questions were answered. The patient knows to call the clinic with any problems questions or concerns.  cc Jearld Fenton, NP   Follow-up in 2 weeks for lab MD 5-FU/bevacizumab.  [name], MD, PhD 11/03/2021

## 2021-11-03 NOTE — Telephone Encounter (Signed)
Requested medications are due for refill today.  yes  Requested medications are on the active medications list.  yes  Last refill. 10/03/2021  Future visit scheduled.   yes  Notes to clinic.  Medication not delegated.    Requested Prescriptions  Pending Prescriptions Disp Refills   ALPRAZolam (XANAX) 0.5 MG tablet [Pharmacy Med Name: ALPRAZOLAM 0.5 MG TABLET] 60 tablet 0    Sig: TAKE 1 TABLET BY MOUTH TWICE A DAY AS NEEDED FOR ANXIETY     Not Delegated - Psychiatry:  Anxiolytics/Hypnotics Failed - 11/03/2021  2:26 PM      Failed - This refill cannot be delegated      Failed - Urine Drug Screen completed in last 360 days      Passed - Valid encounter within last 6 months    Recent Outpatient Visits           6 days ago Pure hypercholesterolemia   [place], Coralie Keens, NP   1 month ago 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 [facility])   [facility] [place], Coralie Keens, NP   3 months ago [facility], Coralie Keens, NP   5 months ago Cervical myelopathy [facility])   [facility], Coralie Keens, NP   7 months ago Colonic mass   [place], NP       Future Appointments             In 5 months [place], Coralie Keens, NP [facility], [facility]

## 2021-11-03 NOTE — Telephone Encounter (Signed)
-----   Message from [name], MD sent at 10/29/2021  7:43 PM EST ----- Please send foundation one liquid biopsy- [colon cancer]. Dianelys Scinto, non urgent, you may wait till Benjamine Mola is back and see how to proceed.  Thanks both.

## 2021-11-03 NOTE — Telephone Encounter (Signed)
Pt called saying she is out and would like this rx filled today.

## 2021-11-03 NOTE — Progress Notes (Signed)
ANC 1.2, B/P 146/86 Per Benjamine Mola RN per Dr. Tasia Catchings okay to proceed with treatment.  Pt unable to give urine sample, Per Benjamine Mola RN per Dr. Tasia Catchings wait for urine protein results prior to proceeding with Zirabev.  Per Benjamine Mola RN per Dr. Tasia Catchings pt to receive 20 MEQs in 1 liter NS over an hour. (**see MAR**).

## 2021-11-03 NOTE — Telephone Encounter (Signed)
Foundation one liquid biopsy sample obtained. Order # [id number]. Required forms have been faxed to [facility]. Liquid biopsy sample has been shipped. Fedex tracking number [id number].

## 2021-11-04 ENCOUNTER — Other Ambulatory Visit: Payer: Self-pay

## 2021-11-04 ENCOUNTER — Telehealth: Payer: Self-pay

## 2021-11-04 ENCOUNTER — Encounter: Payer: Self-pay | Admitting: Oncology

## 2021-11-04 ENCOUNTER — Inpatient Hospital Stay: Payer: Medicare HMO

## 2021-11-04 LAB — CEA: CEA: 24.9 ng/mL — ABNORMAL HIGH (ref 0.0–4.7)

## 2021-11-04 NOTE — Progress Notes (Signed)
Nutrition Follow-up:  Patient with stage IV colorectal cancer mets to liver and pelvis.  Patient on 5FU.    Called patient for nutrition follow-up.   Patient reports that her appetite is good.  "All these people are calling me asking me how I am doing and I am fine, nothing has changed since I found out I have cancer."  "I am eating fine."  Patient seemed frustrated with RD's call.   Anthropometrics:   Weight 180 lb on 1/30 178 lb on 1/11    NUTRITION DIAGNOSIS: none at this time   INTERVENTION:  Reviewed briefly RD's role at cancer center and available if needed in the future   NEXT VISIT: none at this time  Chelsea [name], [place], [place] Registered Dietitian [phone] (mobile)

## 2021-11-04 NOTE — Telephone Encounter (Signed)
Per Corene Cornea with [facility]: since pt lives at [facility] she will be using [facility]. He forwarded [facility] referral to Philippa Sicks with [facility]. I called and spoke to [facility] who confirmed that she received the order for [facility]. I will fax order to add medication management to referral.   Ph: [phone] fax: [phone]

## 2021-11-05 ENCOUNTER — Inpatient Hospital Stay: Payer: Medicare HMO | Attending: Oncology

## 2021-11-05 ENCOUNTER — Other Ambulatory Visit: Payer: Self-pay

## 2021-11-05 ENCOUNTER — Other Ambulatory Visit: Payer: Self-pay | Admitting: Internal Medicine

## 2021-11-05 VITALS — BP 140/81 | HR 75 | Temp 98.0°F | Resp 18

## 2021-11-05 DIAGNOSIS — C787 Secondary malignant neoplasm of liver and intrahepatic bile duct: Secondary | ICD-10-CM | POA: Insufficient documentation

## 2021-11-05 DIAGNOSIS — C180 Malignant neoplasm of cecum: Secondary | ICD-10-CM | POA: Diagnosis not present

## 2021-11-05 DIAGNOSIS — Z5111 Encounter for antineoplastic chemotherapy: Secondary | ICD-10-CM | POA: Diagnosis present

## 2021-11-05 DIAGNOSIS — Z79899 Other long term (current) drug therapy: Secondary | ICD-10-CM | POA: Insufficient documentation

## 2021-11-05 DIAGNOSIS — C189 Malignant neoplasm of colon, unspecified: Secondary | ICD-10-CM

## 2021-11-05 DIAGNOSIS — Z5112 Encounter for antineoplastic immunotherapy: Secondary | ICD-10-CM | POA: Diagnosis present

## 2021-11-05 MED ORDER — METHOCARBAMOL 500 MG PO TABS: 500.0000 mg | ORAL_TABLET | Freq: Two times a day (BID) | ORAL | 0 refills | Status: DC | PRN

## 2021-11-05 MED ORDER — SODIUM CHLORIDE 0.9% FLUSH
10.0000 mL | INTRAVENOUS | Status: DC | PRN
  Administered 2021-11-05: 10 mL
  Filled 2021-11-05: qty 10

## 2021-11-05 MED ORDER — TRIAMTERENE-HCTZ 37.5-25 MG PO TABS: 1.0000 | ORAL_TABLET | Freq: Every day | ORAL | 1 refills | Status: DC

## 2021-11-05 MED ORDER — FLUOXETINE HCL 20 MG PO CAPS: 60.0000 mg | ORAL_CAPSULE | Freq: Every morning | ORAL | 1 refills | Status: DC

## 2021-11-05 MED ORDER — HEPARIN SOD (PORK) LOCK FLUSH 100 UNIT/ML IV SOLN
500.0000 [IU] | Freq: Once | INTRAVENOUS | Status: AC | PRN
  Administered 2021-11-05: 500 [IU]
  Filled 2021-11-05: qty 5

## 2021-11-05 MED ORDER — BUSPIRONE HCL 5 MG PO TABS: 5.0000 mg | ORAL_TABLET | Freq: Three times a day (TID) | ORAL | 1 refills | Status: DC

## 2021-11-08 ENCOUNTER — Other Ambulatory Visit: Payer: Self-pay | Admitting: Oncology

## 2021-11-10 ENCOUNTER — Encounter: Payer: Self-pay | Admitting: Oncology

## 2021-11-10 NOTE — Telephone Encounter (Signed)
             Component Ref Range & Units 7 d ago (11/03/21) 12 d ago (10/29/21) 2 wk ago (10/22/21) 3 wk ago (10/20/21) 3 wk ago (10/15/21) 1 mo ago (09/15/21) 2 mo ago (08/19/21) 2 mo ago (08/19/21)  Potassium 3.5 - 5.1 mmol/L 3.4 Low   3.1 Low   3.5  3.6  3.1 Low   3.5

## 2021-11-11 ENCOUNTER — Other Ambulatory Visit: Payer: Medicare HMO

## 2021-11-11 ENCOUNTER — Other Ambulatory Visit: Payer: Self-pay

## 2021-11-11 ENCOUNTER — Encounter: Payer: Self-pay | Admitting: Oncology

## 2021-11-11 DIAGNOSIS — Z515 Encounter for palliative care: Secondary | ICD-10-CM

## 2021-11-11 NOTE — Progress Notes (Signed)
PATIENT NAME: Chelsea [name] DOB: 09-Jul-1946 MRN: [id number]  PRIMARY CARE PROVIDER: Jearld Fenton, NP  RESPONSIBLE PARTY:  Acct ID - Guarantor Home Phone Work Phone Relationship Acct Type  [id number] LORRINE, [phone]  Self P/F     [address], [place], [place], [place] [address]   Palliative care RN telephonic encounter completed with patient. Patient verbally responsive, able to answer questions. Denies any pain or shortness of breath at this time. She states that she continues to receive radiation treatments and tolerating them well. Denies any need for an in person visit. Patient stated she is going out of town on a trip with her family.Encouraged patient to call with any questions and concerns.   Cornelius Moras, RN

## 2021-11-12 ENCOUNTER — Inpatient Hospital Stay: Payer: Medicare HMO | Attending: Hospice and Palliative Medicine | Admitting: Hospice and Palliative Medicine

## 2021-11-12 ENCOUNTER — Other Ambulatory Visit: Payer: Self-pay

## 2021-11-12 DIAGNOSIS — C787 Secondary malignant neoplasm of liver and intrahepatic bile duct: Secondary | ICD-10-CM | POA: Diagnosis not present

## 2021-11-12 DIAGNOSIS — C189 Malignant neoplasm of colon, unspecified: Secondary | ICD-10-CM | POA: Diagnosis not present

## 2021-11-12 NOTE — Telephone Encounter (Signed)
Copy of report sent to scan in media.

## 2021-11-12 NOTE — Progress Notes (Signed)
Virtual Visit via Telephone Note  I connected with Chelsea [name] on 11/12/21 at 10:30 AM EST by telephone and verified that I am speaking with the correct person using two identifiers.  Location: Patient: Home Provider: Clinic   I discussed the limitations, risks, security and privacy concerns of performing an evaluation and management service by telephone and the availability of in person appointments. I also discussed with the patient that there may be a patient responsible charge related to this service. The patient expressed understanding and agreed to proceed.   History of Present Illness: Chelsea [name] is a 76 year old woman with multiple medical problems including metastatic colon cancer who was started on chemotherapy with cycle one 5-FU and bevacizumab on 10/20/2021.   Observations/Objective: I called and spoke with patient. She reports that she is doing well without significant symptomatic complaints at present.  She says that she has not required any of her "as needed" medications she says that she does not feel like she is sick.  She says she has strong faith and that God does not feel like God is telling her that she is "dying."  She remains in agreement with current scope of treatment.  Patient said that she did not feel she needed a phone call to check on her.  Assessment and Plan: Colon cancer -on systemic chemotherapy.  Appears to be doing well without any significant symptomatic complaints.  She sees Dr. Tasia Catchings next week  Follow Up Instructions: As needed   I discussed the assessment and treatment plan with the patient. The patient was provided an opportunity to ask questions and all were answered. The patient agreed with the plan and demonstrated an understanding of the instructions.   The patient was advised to call back or seek an in-person evaluation if the symptoms worsen or if the condition fails to improve as anticipated.  I provided 5 minutes of  non-face-to-face time during this encounter.   Irean Hong, NP

## 2021-11-13 ENCOUNTER — Encounter: Payer: Self-pay | Admitting: Oncology

## 2021-11-13 NOTE — Telephone Encounter (Signed)
They were just sent to scan, they should be available later today.

## 2021-11-17 ENCOUNTER — Other Ambulatory Visit: Payer: Self-pay

## 2021-11-17 ENCOUNTER — Inpatient Hospital Stay (HOSPITAL_BASED_OUTPATIENT_CLINIC_OR_DEPARTMENT_OTHER): Payer: Medicare HMO | Admitting: Oncology

## 2021-11-17 ENCOUNTER — Inpatient Hospital Stay: Payer: Medicare HMO

## 2021-11-17 ENCOUNTER — Encounter: Payer: Self-pay | Admitting: Oncology

## 2021-11-17 VITALS — BP 157/86 | HR 67 | Temp 97.0°F | Wt 165.3 lb

## 2021-11-17 DIAGNOSIS — C189 Malignant neoplasm of colon, unspecified: Secondary | ICD-10-CM

## 2021-11-17 DIAGNOSIS — Z7189 Other specified counseling: Secondary | ICD-10-CM

## 2021-11-17 DIAGNOSIS — Z5111 Encounter for antineoplastic chemotherapy: Secondary | ICD-10-CM | POA: Diagnosis not present

## 2021-11-17 DIAGNOSIS — C787 Secondary malignant neoplasm of liver and intrahepatic bile duct: Secondary | ICD-10-CM

## 2021-11-17 DIAGNOSIS — E876 Hypokalemia: Secondary | ICD-10-CM | POA: Insufficient documentation

## 2021-11-17 DIAGNOSIS — R634 Abnormal weight loss: Secondary | ICD-10-CM | POA: Insufficient documentation

## 2021-11-17 DIAGNOSIS — Z5112 Encounter for antineoplastic immunotherapy: Secondary | ICD-10-CM | POA: Diagnosis not present

## 2021-11-17 LAB — COMPREHENSIVE METABOLIC PANEL
ALT: 14 U/L (ref 0–44)
AST: 18 U/L (ref 15–41)
Albumin: 3.7 g/dL (ref 3.5–5.0)
Alkaline Phosphatase: 72 U/L (ref 38–126)
Anion gap: 9 (ref 5–15)
BUN: 26 mg/dL — ABNORMAL HIGH (ref 8–23)
CO2: 26 mmol/L (ref 22–32)
Calcium: 9 mg/dL (ref 8.9–10.3)
Chloride: 102 mmol/L (ref 98–111)
Creatinine, Ser: 0.92 mg/dL (ref 0.44–1.00)
GFR, Estimated: >60 mL/min (ref >60)
Glucose, Bld: 105 mg/dL — ABNORMAL HIGH (ref 70–99)
Potassium: 3.5 mmol/L (ref 3.5–5.1)
Sodium: 137 mmol/L (ref 135–145)
Total Bilirubin: 0.2 mg/dL — ABNORMAL LOW (ref 0.3–1.2)
Total Protein: 7.3 g/dL (ref 6.5–8.1)

## 2021-11-17 LAB — CBC WITH DIFFERENTIAL/PLATELET
Abs Immature Granulocytes: 0.01 10*3/uL (ref 0.00–0.07)
Basophils Absolute: 0 10*3/uL (ref 0.0–0.1)
Basophils Relative: 1 %
Eosinophils Absolute: 0.1 10*3/uL (ref 0.0–0.5)
Eosinophils Relative: 3 %
HCT: 34.6 % — ABNORMAL LOW (ref 36.0–46.0)
Hemoglobin: 10.8 g/dL — ABNORMAL LOW (ref 12.0–15.0)
Immature Granulocytes: 0 %
Lymphocytes Relative: 49 %
Lymphs Abs: 1.6 10*3/uL (ref 0.7–4.0)
MCH: 29.4 pg (ref 26.0–34.0)
MCHC: 31.2 g/dL (ref 30.0–36.0)
MCV: 94.3 fL (ref 80.0–100.0)
Monocytes Absolute: 0.5 10*3/uL (ref 0.1–1.0)
Monocytes Relative: 14 %
Neutro Abs: 1.1 10*3/uL — ABNORMAL LOW (ref 1.7–7.7)
Neutrophils Relative %: 33 %
Platelets: 138 10*3/uL — ABNORMAL LOW (ref 150–400)
RBC: 3.67 MIL/uL — ABNORMAL LOW (ref 3.87–5.11)
RDW: 14.4 % (ref 11.5–15.5)
WBC: 3.3 10*3/uL — ABNORMAL LOW (ref 4.0–10.5)
nRBC: 0 % (ref 0.0–0.2)

## 2021-11-17 LAB — PROTEIN, URINE, RANDOM: Total Protein, Urine: 10 mg/dL

## 2021-11-17 MED ORDER — SODIUM CHLORIDE 0.9 % IV SOLN
2400.0000 mg/m2 | INTRAVENOUS | Status: DC
  Administered 2021-11-17: 4300 mg via INTRAVENOUS
  Filled 2021-11-17: qty 86

## 2021-11-17 MED ORDER — PROCHLORPERAZINE MALEATE 10 MG PO TABS
10.0000 mg | ORAL_TABLET | Freq: Once | ORAL | Status: AC
  Administered 2021-11-17: 10 mg via ORAL
  Filled 2021-11-17: qty 1

## 2021-11-17 MED ORDER — FLUOROURACIL CHEMO INJECTION 2.5 GM/50ML
400.0000 mg/m2 | Freq: Once | INTRAVENOUS | Status: AC
  Administered 2021-11-17: 700 mg via INTRAVENOUS
  Filled 2021-11-17: qty 14

## 2021-11-17 MED ORDER — SODIUM CHLORIDE 0.9% FLUSH
10.0000 mL | Freq: Once | INTRAVENOUS | Status: AC
  Administered 2021-11-17: 10 mL via INTRAVENOUS
  Filled 2021-11-17: qty 10

## 2021-11-17 MED ORDER — SODIUM CHLORIDE 0.9 % IV SOLN
5.0000 mg/kg | Freq: Once | INTRAVENOUS | Status: AC
  Administered 2021-11-17: 400 mg via INTRAVENOUS
  Filled 2021-11-17: qty 16

## 2021-11-17 MED ORDER — SODIUM CHLORIDE 0.9 % IV SOLN
Freq: Once | INTRAVENOUS | Status: AC
  Filled 2021-11-17: qty 250

## 2021-11-17 MED ORDER — SODIUM CHLORIDE 0.9 % IV SOLN
700.0000 mg | Freq: Once | INTRAVENOUS | Status: AC
  Administered 2021-11-17: 700 mg via INTRAVENOUS
  Filled 2021-11-17: qty 25

## 2021-11-17 NOTE — Patient Instructions (Signed)
[facility] CANCER CTR AT [place]  Discharge Instructions: Thank you for choosing [facility] to provide your oncology and hematology care.  If you have a lab appointment with the [facility], please go directly to the [facility] and check in at the registration area.  Wear comfortable clothing and clothing appropriate for easy access to any Portacath or PICC line.   We strive to give you quality time with your provider. You may need to reschedule your appointment if you arrive late (15 or more minutes).  Arriving late affects you and other patients whose appointments are after yours.  Also, if you miss three or more appointments without notifying the office, you may be dismissed from the clinic at the provider's discretion.      For prescription refill requests, have your pharmacy contact our office and allow 72 hours for refills to be completed.    Today you received the following chemotherapy and/or immunotherapy agents Zirabev, leucovorin, Adrucil    To help prevent nausea and vomiting after your treatment, we encourage you to take your nausea medication as directed.  BELOW ARE SYMPTOMS THAT SHOULD BE REPORTED IMMEDIATELY: *FEVER GREATER THAN 100.4 F (38 C) OR HIGHER *CHILLS OR SWEATING *NAUSEA AND VOMITING THAT IS NOT CONTROLLED WITH YOUR NAUSEA MEDICATION *UNUSUAL SHORTNESS OF BREATH *UNUSUAL BRUISING OR BLEEDING *URINARY PROBLEMS (pain or burning when urinating, or frequent urination) *BOWEL PROBLEMS (unusual diarrhea, constipation, pain near the anus) TENDERNESS IN MOUTH AND THROAT WITH OR WITHOUT PRESENCE OF ULCERS (sore throat, sores in mouth, or a toothache) UNUSUAL RASH, SWELLING OR PAIN  UNUSUAL VAGINAL DISCHARGE OR ITCHING   Items with * indicate a potential emergency and should be followed up as soon as possible or go to the Emergency Department if any problems should occur.  Please show the CHEMOTHERAPY ALERT CARD or IMMUNOTHERAPY ALERT CARD  at check-in to the Emergency Department and triage nurse.  Should you have questions after your visit or need to cancel or reschedule your appointment, please contact [facility] CANCER [place] AT [place]  [phone] and follow the prompts.  Office hours are 8:00 a.m. to 4:30 p.m. Monday - Friday. Please note that voicemails left after 4:00 p.m. may not be returned until the following business day.  We are closed weekends and major holidays. You have access to a nurse at all times for urgent questions. Please call the main number to the clinic [phone] and follow the prompts.  For any non-urgent questions, you may also contact your provider using MyChart. We now offer e-Visits for anyone 59 and older to request care online for non-urgent symptoms. For details visit [email or web address].   Also download the MyChart app! Go to the app store, search "MyChart", open the app, select [place], and log in with your MyChart username and password.  Due to Covid, a mask is required upon entering the hospital/clinic. If you do not have a mask, one will be given to you upon arrival. For doctor visits, patients may have 1 support person aged 60 or older with them. For treatment visits, patients cannot have anyone with them due to current Covid guidelines and our immunocompromised population.

## 2021-11-17 NOTE — Progress Notes (Signed)
Hematology/Oncology follow up note [phone] [phone]      Patient Care Team: Jearld Fenton, NP as PCP - General (Internal Medicine) Clent Jacks, RN as Oncology Nurse Navigator [name], MD as Consulting Physician (Oncology)  REFERRING PROVIDER: Jearld Fenton, NP  CHIEF COMPLAINTS/REASON FOR VISIT:  metastatic colon cancer  HISTORY OF PRESENTING ILLNESS:   Chelsea [name] is a  76 y.o.  female with PMH listed below was seen in consultation at the request of  Jearld Fenton, NP  for evaluation of metastatic colon cancer.  Patient initially went to emergency room on 03/24/2021 for abdominal pain. 03/24/2021, CT scan of the abdomen showed marked bladder wall thickening with surrounding soft tissue stranding is noted.  Concerning for cystitis.  There is a lobulated mass between the posterior wall of the bladder and the rectum which appears to arise from the vaginal cuff.  This is indeterminate and difficult to characterize reflecting lack of IV contrast material.  Nonobstructing left renal calculus, left sacroiliitis, lumbar spondylosis aortic atherosclerosis. 03/24/2021 CT pelvic showed abdominal [place] soft tissue of right cecum, concerning for colon carcinoma.  Colonoscopy recommended.  Low-attenuation mass in the region of vaginal cuff is again noted.  Diffuse urinary bladder wall thickening and mild bilateral hydroureter possibly cystitis.  [place] 03/24/2021 Lobulated solid 3.8 cm mass confirmed by ultrasound. This is most compatible with a neoplasm but remains indeterminate as to the origin as the epicenter seems to be outside of both the vaginal cuff and the adjacent colon, while the lesion appears inseparable from both. Extensive echogenic debris within the distended urinary bladder.   03/26/2021, patient was seen by Dr. Theora Gianotti for evaluation of colonic/vaginal cuff pelvic mass.  Patient also was referred to gastroenterology for  colonoscopy.  03/26/2021 CEA 3.4.  CA125 9.7 04/04/2021, status post colonoscopy which showed a frond like /villous nonobstructing large mass was found in the cecum, this was biopsied.  Terminal ileum was briefly intubated and appeared normal.  Diverticulosis in the sigmoid colon.  The rectum, sigmoid colon, descending colon, transverse colon and ascending colon were normal.  Nonbleeding internal hemorrhoids. 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    Given that the biopsy may not be representative of the entire underlying lesion.  Patient was recommended to establish with surgeon for evaluation. Patient no showed for her surgery appointment and as well as her follow-up appointment with gastroenterology.  Per daughter, patient was having cervical spine surgery and she prioritized that over the colon surgery.  08/19/2021, patient establish care with surgery Dr. Christian Mate 08/21/2021, CT abdomen pelvis with contrast  showed soft tissue mass at the base of cecum measuring up to 5 cm, slightly increased in size.  Interval development of multiple low-density lesions throughout the liver highly suggestive for metastatic disease.  Complex mass in the region of the vaginal cuff measuring up to 5.2 cm, increased in size.  Right ovary also appears more prominent on the current examination.  Moderate large volume of stool throughout the colon.  Mild urinary bladder wall thickening.  Colonic diverticulosis without evidence of active diverticulitis.  Aortic atherosclerosis  09/04/2021, patient is status post left lobe liver lesion biopsy and pathology showed metastatic adenocarcinoma, compatible with colorectal primary.  She was referred to establish care with oncology. She was accompanied by her daughter today.  Patient walks with a walker.+ Night sweats + weight loss.  Denies any abdominal pain currently, melena, blood in the stool.  She is currently being  followed by home health  PT/OT following a previous spinal surgery  10/10/2020, status post Mediport placement. 10/20/2020, cycle one 5-FU/bevacizumab.  INTERVAL HISTORY Chelsea [name] is a 76 y.o. female who has above history reviewed by me today presents for follow up visit for management of metastatic colon cancer Patient was accompanied by her caregiver. Overall patient has been doing well.  No nausea vomiting. She denies any loose bowel movements.  Accompanied by her son in law [name].  She has lost weight, 15 pounds.   Review of Systems  Constitutional:  Positive for fatigue and unexpected weight change. Negative for appetite change, chills and fever.  HENT:   Negative for hearing loss and voice change.   Eyes:  Negative for eye problems.  Respiratory:  Negative for chest tightness and cough.   Cardiovascular:  Negative for chest pain.  Gastrointestinal:  Negative for abdominal distention, abdominal pain, blood in stool and diarrhea.  Endocrine: Negative for hot flashes.  Genitourinary:  Negative for difficulty urinating and frequency.   Musculoskeletal:  Positive for back pain. Negative for arthralgias.  Skin:  Negative for itching and rash.  Neurological:  Positive for numbness (chornic finger tips). Negative for extremity weakness.  Hematological:  Negative for adenopathy.  Psychiatric/Behavioral:  Negative for confusion.    MEDICAL HISTORY:  Past Medical History:  Diagnosis Date   Anxiety    Arthritis    Depression    Hyperlipidemia    Hypertension    Liver mass    Metastatic colon cancer to liver ([place])    Port-A-Cath in place    Seizures ([place])    Spinal stenosis    Ulcer    Urinary incontinence     SURGICAL HISTORY: Past Surgical History:  Procedure Laterality Date   ABDOMINAL HYSTERECTOMY     BACK SURGERY     due to polio   BREAST BIOPSY Bilateral    neg   BREAST BIOPSY Right 2011   neg/stereo   CARPAL TUNNEL RELEASE     CATARACT EXTRACTION Right 2020   COLONOSCOPY WITH  PROPOFOL N/A 04/04/2021   Procedure: COLONOSCOPY WITH PROPOFOL;  Surgeon: Virgel Manifold, MD;  Location: [facility] ENDOSCOPY;  Service: Endoscopy;  Laterality: N/A;   EYE SURGERY     IR IMAGING GUIDED PORT INSERTION  10/09/2021    SOCIAL HISTORY: Social History   Socioeconomic History   Marital status: Divorced    Spouse name: Not on file   Number of children: Not on file   Years of education: Not on file   Highest education level: Not on file  Occupational History   Not on file  Tobacco Use   Smoking status: Never   Smokeless tobacco: Never  Vaping Use   Vaping Use: Never used  Substance and Sexual Activity   Alcohol use: Never   Drug use: No   Sexual activity: Not on file  Other Topics Concern   Not on file  Social History Narrative   Not on file   Social Determinants of Health   Financial Resource Strain: Not on file  Food Insecurity: Not on file  Transportation Needs: Not on file  Physical Activity: Not on file  Stress: Not on file  Social Connections: Not on file  Intimate Partner Violence: Not on file    FAMILY HISTORY: Family History  Problem Relation Age of Onset   Arthritis Mother    Heart disease Mother    Stroke Mother    Hypertension Mother    COPD  Mother    Sudden death Sister    Other Father        unknown medical history   Breast cancer Neg Hx     ALLERGIES:  is allergic to penicillins.  MEDICATIONS:  Current Outpatient Medications  Medication Sig Dispense Refill   acetaminophen (TYLENOL) 500 MG tablet Take 500 mg by mouth every 6 (six) hours as needed.     ALPRAZolam (XANAX) 0.5 MG tablet TAKE 1 TABLET BY MOUTH TWICE A DAY AS NEEDED FOR ANXIETY 60 tablet 0   aspirin 325 MG tablet Take 325 mg by mouth daily.     atorvastatin (LIPITOR) 10 MG tablet Take 1 tablet (10 mg total) by mouth daily. 30 tablet 2   busPIRone (BUSPAR) 5 MG tablet Take 1 tablet (5 mg total) by mouth 3 (three) times daily. 270 tablet 1   Cyanocobalamin (VITAMIN B 12 PO)  Take 500 mcg by mouth daily.     diclofenac Sodium (VOLTAREN) 1 % GEL Apply 4 g topically 4 (four) times daily. 4 g 0   FLUoxetine (PROZAC) 20 MG capsule Take 3 capsules (60 mg total) by mouth every morning. 270 capsule 1   gabapentin (NEURONTIN) 100 MG capsule TAKE 2 CAPSULES BY MOUTH 2 TIMES DAILY. 360 capsule 0   KLOR-CON M20 20 MEQ tablet TAKE 1 TABLET BY MOUTH EVERY DAY 30 tablet 1   lidocaine (LIDODERM) 5 % Place 1 patch onto the skin daily. Remove & Discard patch within 12 hours or as directed by MD 1 patch 0   lidocaine-prilocaine (EMLA) cream Apply small amount of cream to port site 1-2 hour prior to chemo treatment. 30 g 3   loperamide (IMODIUM) 2 MG capsule Take 1 capsule (2 mg total) by mouth See admin instructions. Initial: 4 mg, followed by 2 mg after each loose stool; maximum: 16 mg/day 60 capsule 0   meclizine (ANTIVERT) 12.5 MG tablet Take 1 tablet (12.5 mg total) by mouth 3 (three) times daily as needed for dizziness. 30 tablet 0   methocarbamol (ROBAXIN) 500 MG tablet Take 1 tablet (500 mg total) by mouth 2 (two) times daily as needed for muscle spasms. 60 tablet 0   ondansetron (ZOFRAN) 8 MG tablet Take 1 tablet (8 mg total) by mouth 2 (two) times daily as needed (Nausea or vomiting). 30 tablet 1   polyethylene glycol powder (GLYCOLAX/MIRALAX) 17 GM/SCOOP powder Take 17 g by mouth daily. 3350 g 1   prochlorperazine (COMPAZINE) 10 MG tablet Take 1 tablet (10 mg total) by mouth every 6 (six) hours as needed (Nausea or vomiting). 30 tablet 1   triamterene-hydrochlorothiazide (MAXZIDE-25) 37.5-25 MG tablet Take 1 tablet by mouth daily. 90 tablet 1   vitamin E 180 MG (400 UNITS) capsule Take 400 Units by mouth daily.     No current facility-administered medications for this visit.   Facility-Administered Medications Ordered in Other Visits  Medication Dose Route Frequency Provider Last Rate Last Admin   fluorouracil (ADRUCIL) 4,300 mg in sodium chloride 0.9 % 64 mL chemo infusion   2,400 mg/m2 (Order-Specific) Intravenous 1 day or 1 dose [name], MD   4,300 mg at 11/17/21 1144     PHYSICAL EXAMINATION: ECOG PERFORMANCE STATUS: 2 - Symptomatic, <50% confined to bed Vitals:   11/17/21 0903  BP: (!) 157/86  Pulse: 67  Temp: (!) 97 F (36.1 C)   Filed Weights   11/17/21 0903  Weight: 165 lb 4.8 oz (75 kg)     Physical Exam Constitutional:  General: She is not in acute distress.    Comments: Patient sits in wheelchair  HENT:     Head: Normocephalic and atraumatic.  Eyes:     General: No scleral icterus. Cardiovascular:     Rate and Rhythm: Normal rate and regular rhythm.     Heart sounds: Normal heart sounds.  Pulmonary:     Effort: Pulmonary effort is normal. No respiratory distress.     Breath sounds: No wheezing.  Abdominal:     General: Bowel sounds are normal. There is no distension.     Palpations: Abdomen is soft.  Musculoskeletal:        General: No deformity. Normal range of motion.     Cervical back: Normal range of motion and neck supple.  Skin:    General: Skin is warm and dry.     Findings: No erythema or rash.  Neurological:     Mental Status: She is alert and oriented to person, place, and time. Mental status is at baseline.     Cranial Nerves: No cranial nerve deficit.     Coordination: Coordination normal.  Psychiatric:        Mood and Affect: Mood normal.    LABORATORY DATA:  I have reviewed the data as listed Lab Results  Component Value Date   WBC 3.3 (L) 11/17/2021   HGB 10.8 (L) 11/17/2021   HCT 34.6 (L) 11/17/2021   MCV 94.3 11/17/2021   PLT 138 (L) 11/17/2021   Recent Labs    10/29/21 0912 11/03/21 0830 11/17/21 0834  NA 138 136 137  K 3.1* 3.4* 3.5  CL 101 98 102  CO2 _0 GLUCOSE 118* 108* 105*  BUN 18 20 26*  CREATININE 0.87 1.07* 0.92  CALCIUM 9.2 9.0 9.0  GFRNONAA >60 54* >60  PROT 7.3 7.2 7.3  ALBUMIN 3.6 3.4* 3.7  AST _1 ALT _2 ALKPHOS 95 86 72  BILITOT 0.5 0.6 0.2*     Iron/TIBC/Ferritin/ %Sat    Component Value Date/Time   FERRITIN 58 10/24/2020 0402       RADIOGRAPHIC STUDIES: I have personally reviewed the radiological images as listed and agreed with the findings in the report. No results found.    ASSESSMENT & PLAN:  1. Encounter for antineoplastic chemotherapy   2. Metastatic colon cancer to liver ([place])   3. Hypokalemia   4. Weight loss   5. Goals of care, counseling/discussion    Cancer Staging  Metastatic colon cancer to liver [facility]) Staging form: Colon and Rectum - Neuroendocine Tumors, AJCC 8th Edition - Clinical stage from 09/16/2021: Stage IV (cTX, cNX, cM1) - Signed by [name], MD on 09/16/2021  #Metastatic colon cancer to liver, 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  No sufficient tissue for NGS Liquid biopsy showed KRAS12V, APC, TP53 mutation, TMB 4.  CEA is trending down.  Labs are reviewed and discussed with patient. Proceed with cycle 6 5-FU/bevacizumab.   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  Biopsy of the vaginal cuff mass for more tissue may be able to be performed but probably require operative intervention. Liquid biopsy was performed instead.   #Depression, psychiatry referral #Hypokalemia, improved, continue potassium chloride 61mq daily.    #Chemotherapy-induced diarrhea.  Resolved.   Recommend Imodium as needed as instructed.  Supportive care measures are necessary for patient well-being and will be provided as necessary. \All questions were answered. The patient knows to call the clinic  with any problems questions or concerns.  cc Jearld Fenton, NP   Follow-up in 2 weeks for lab MD 5-FU/bevacizumab.  [name], MD, PhD 11/17/2021

## 2021-11-17 NOTE — Progress Notes (Signed)
Per MD to proceed with urine protein on 11/03/21.   Chelsea [name] [employer]

## 2021-11-18 ENCOUNTER — Encounter: Payer: Self-pay | Admitting: Oncology

## 2021-11-18 LAB — CEA: CEA: 11.2 ng/mL — ABNORMAL HIGH (ref 0.0–4.7)

## 2021-11-19 ENCOUNTER — Other Ambulatory Visit: Payer: Self-pay

## 2021-11-19 ENCOUNTER — Inpatient Hospital Stay: Payer: Medicare HMO

## 2021-11-19 VITALS — BP 124/83 | HR 70 | Temp 96.7°F | Resp 18

## 2021-11-19 DIAGNOSIS — Z5112 Encounter for antineoplastic immunotherapy: Secondary | ICD-10-CM | POA: Diagnosis not present

## 2021-11-19 DIAGNOSIS — C189 Malignant neoplasm of colon, unspecified: Secondary | ICD-10-CM

## 2021-11-19 DIAGNOSIS — C787 Secondary malignant neoplasm of liver and intrahepatic bile duct: Secondary | ICD-10-CM

## 2021-11-19 MED ORDER — SODIUM CHLORIDE 0.9% FLUSH
10.0000 mL | INTRAVENOUS | Status: DC | PRN
  Administered 2021-11-19: 10 mL
  Filled 2021-11-19: qty 10

## 2021-11-19 MED ORDER — HEPARIN SOD (PORK) LOCK FLUSH 100 UNIT/ML IV SOLN
500.0000 [IU] | Freq: Once | INTRAVENOUS | Status: AC | PRN
  Administered 2021-11-19: 500 [IU]
  Filled 2021-11-19: qty 5

## 2021-11-27 ENCOUNTER — Telehealth: Payer: Self-pay | Admitting: Internal Medicine

## 2021-11-27 ENCOUNTER — Other Ambulatory Visit: Payer: Self-pay | Admitting: Internal Medicine

## 2021-11-27 DIAGNOSIS — F411 Generalized anxiety disorder: Secondary | ICD-10-CM

## 2021-11-27 NOTE — Telephone Encounter (Signed)
Requested medication (s) are due for refill today: due 12/03/21  Requested medication (s) are on the active medication list: yes    Last refill: 11/05/21  #60  0 refills  Future visit scheduled no  Notes to clinic:not delegated, please review. Thank you.  Requested Prescriptions  Pending Prescriptions Disp Refills   methocarbamol (ROBAXIN) 500 MG tablet [Pharmacy Med Name: METHOCARBAMOL 500 MG Tablet] 60 tablet 0    Sig: TAKE 1 TABLET TWICE DAILY AS NEEDED FOR MUSCLE SPASM(S)     Not Delegated - Analgesics:  Muscle Relaxants Failed - 11/27/2021  4:13 AM      Failed - This refill cannot be delegated      Passed - Valid encounter within last 6 months    Recent Outpatient Visits           1 month ago Pure hypercholesterolemia   [place], Coralie Keens, NP   2 months ago 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 [facility])   [facility] [place], Coralie Keens, NP   4 months ago [facility] [place], Coralie Keens, NP   6 months ago Cervical myelopathy [facility])   [facility], Coralie Keens, NP   8 months ago Colonic mass   [place], NP       Future Appointments             In 5 months Baity, Coralie Keens, NP [facility], [facility]

## 2021-11-27 NOTE — Telephone Encounter (Signed)
Pt called in to follow up regarding this refill request.

## 2021-11-27 NOTE — Telephone Encounter (Signed)
Requested medications are due for refill today.  yes  Requested medications are on the active medications list.  yes  Last refill. 11/04/2021 #60 0 refills  Future visit scheduled.   yes  Notes to clinic.  Medication not delegated.    Requested Prescriptions  Pending Prescriptions Disp Refills   ALPRAZolam (XANAX) 0.5 MG tablet [Pharmacy Med Name: ALPRAZOLAM 0.5 MG TABLET] 60 tablet 0    Sig: TAKE 1 TABLET BY MOUTH TWICE A DAY AS NEEDED FOR ANXIETY     Not Delegated - Psychiatry: Anxiolytics/Hypnotics 2 Failed - 11/27/2021  2:28 PM      Failed - This refill cannot be delegated      Failed - Urine Drug Screen completed in last 360 days      Passed - Patient is not pregnant      Passed - Valid encounter within last 6 months    Recent Outpatient Visits           1 month ago Pure hypercholesterolemia   [place], Coralie Keens, NP   2 months ago 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 [facility])   [facility] [place], Coralie Keens, NP   4 months ago [facility] [place], Coralie Keens, NP   6 months ago Cervical myelopathy [facility])   [facility], Coralie Keens, NP   8 months ago Colonic mass   [facility] [place], Coralie Keens, NP       Future Appointments             In 5 months Baity, Coralie Keens, NP [facility], [facility]

## 2021-11-28 NOTE — Telephone Encounter (Signed)
She still has another week before this can be filled. Can be filled on 3/2

## 2021-12-01 ENCOUNTER — Inpatient Hospital Stay (HOSPITAL_BASED_OUTPATIENT_CLINIC_OR_DEPARTMENT_OTHER): Payer: Medicare HMO | Admitting: Oncology

## 2021-12-01 ENCOUNTER — Inpatient Hospital Stay: Payer: Medicare HMO

## 2021-12-01 ENCOUNTER — Encounter: Payer: Self-pay | Admitting: Oncology

## 2021-12-01 ENCOUNTER — Other Ambulatory Visit: Payer: Self-pay

## 2021-12-01 VITALS — BP 135/78 | HR 73 | Temp 97.3°F | Wt 178.0 lb

## 2021-12-01 DIAGNOSIS — Z5112 Encounter for antineoplastic immunotherapy: Secondary | ICD-10-CM | POA: Diagnosis not present

## 2021-12-01 DIAGNOSIS — C787 Secondary malignant neoplasm of liver and intrahepatic bile duct: Secondary | ICD-10-CM | POA: Diagnosis not present

## 2021-12-01 DIAGNOSIS — C189 Malignant neoplasm of colon, unspecified: Secondary | ICD-10-CM

## 2021-12-01 LAB — CBC WITH DIFFERENTIAL/PLATELET
Abs Immature Granulocytes: 0 10*3/uL (ref 0.00–0.07)
Basophils Absolute: 0 10*3/uL (ref 0.0–0.1)
Basophils Relative: 0 %
Eosinophils Absolute: 0.1 10*3/uL (ref 0.0–0.5)
Eosinophils Relative: 3 %
HCT: 32.9 % — ABNORMAL LOW (ref 36.0–46.0)
Hemoglobin: 10.4 g/dL — ABNORMAL LOW (ref 12.0–15.0)
Immature Granulocytes: 0 %
Lymphocytes Relative: 44 %
Lymphs Abs: 1.7 10*3/uL (ref 0.7–4.0)
MCH: 30.2 pg (ref 26.0–34.0)
MCHC: 31.6 g/dL (ref 30.0–36.0)
MCV: 95.6 fL (ref 80.0–100.0)
Monocytes Absolute: 0.5 10*3/uL (ref 0.1–1.0)
Monocytes Relative: 13 %
Neutro Abs: 1.5 10*3/uL — ABNORMAL LOW (ref 1.7–7.7)
Neutrophils Relative %: 40 %
Platelets: 115 10*3/uL — ABNORMAL LOW (ref 150–400)
RBC: 3.44 MIL/uL — ABNORMAL LOW (ref 3.87–5.11)
RDW: 15.7 % — ABNORMAL HIGH (ref 11.5–15.5)
WBC: 3.8 10*3/uL — ABNORMAL LOW (ref 4.0–10.5)
nRBC: 0 % (ref 0.0–0.2)

## 2021-12-01 LAB — COMPREHENSIVE METABOLIC PANEL
ALT: 17 U/L (ref 0–44)
AST: 25 U/L (ref 15–41)
Albumin: 3.3 g/dL — ABNORMAL LOW (ref 3.5–5.0)
Alkaline Phosphatase: 75 U/L (ref 38–126)
Anion gap: 6 (ref 5–15)
BUN: 15 mg/dL (ref 8–23)
CO2: 30 mmol/L (ref 22–32)
Calcium: 8.9 mg/dL (ref 8.9–10.3)
Chloride: 103 mmol/L (ref 98–111)
Creatinine, Ser: 0.9 mg/dL (ref 0.44–1.00)
GFR, Estimated: >60 mL/min (ref >60)
Glucose, Bld: 118 mg/dL — ABNORMAL HIGH (ref 70–99)
Potassium: 3.4 mmol/L — ABNORMAL LOW (ref 3.5–5.1)
Sodium: 139 mmol/L (ref 135–145)
Total Bilirubin: 0.3 mg/dL (ref 0.3–1.2)
Total Protein: 7 g/dL (ref 6.5–8.1)

## 2021-12-01 MED ORDER — SODIUM CHLORIDE 0.9 % IV SOLN
5.0000 mg/kg | Freq: Once | INTRAVENOUS | Status: AC
  Administered 2021-12-01: 400 mg via INTRAVENOUS
  Filled 2021-12-01: qty 16

## 2021-12-01 MED ORDER — POTASSIUM CHLORIDE 20 MEQ/100ML IV SOLN
20.0000 meq | Freq: Once | INTRAVENOUS | Status: AC
  Administered 2021-12-01: 20 meq via INTRAVENOUS

## 2021-12-01 MED ORDER — FLUOROURACIL CHEMO INJECTION 2.5 GM/50ML
400.0000 mg/m2 | Freq: Once | INTRAVENOUS | Status: AC
  Administered 2021-12-01: 700 mg via INTRAVENOUS
  Filled 2021-12-01: qty 14

## 2021-12-01 MED ORDER — SODIUM CHLORIDE 0.9 % IV SOLN
700.0000 mg | Freq: Once | INTRAVENOUS | Status: AC
  Administered 2021-12-01: 700 mg via INTRAVENOUS
  Filled 2021-12-01: qty 35

## 2021-12-01 MED ORDER — SODIUM CHLORIDE 0.9 % IV SOLN
Freq: Once | INTRAVENOUS | Status: AC
  Filled 2021-12-01: qty 250

## 2021-12-01 MED ORDER — SODIUM CHLORIDE 0.9 % IV SOLN
2400.0000 mg/m2 | INTRAVENOUS | Status: DC
  Administered 2021-12-01: 4300 mg via INTRAVENOUS
  Filled 2021-12-01: qty 86

## 2021-12-01 MED ORDER — PROCHLORPERAZINE MALEATE 10 MG PO TABS
10.0000 mg | ORAL_TABLET | Freq: Once | ORAL | Status: AC
  Administered 2021-12-01: 10 mg via ORAL
  Filled 2021-12-01: qty 1

## 2021-12-01 NOTE — Telephone Encounter (Signed)
Pt called back asking to have the nurse or Ms. Baity to call  her back.

## 2021-12-01 NOTE — Telephone Encounter (Signed)
Pt called in and wanted to know why her Xanax was not sent in, I advised that it was due on 3/2, pt stated that I am incorrect and began to get upset that her medication will not be sent in until 3/2, pt wanted a call back with an explantation why it wont be refilled until 3/2.

## 2021-12-01 NOTE — Progress Notes (Signed)
Patient here for follow up. Patient called ambulance last weekend because she was very cold. Patient states her hands and feet are turning black.

## 2021-12-01 NOTE — Patient Instructions (Signed)
[facility] CANCER CTR AT [place]  Discharge Instructions: Thank you for choosing [facility] [facility] to provide your oncology and hematology care.  If you have a lab appointment with the [facility], please go directly to the [place] and check in at the registration area.  Wear comfortable clothing and clothing appropriate for easy access to any Portacath or PICC line.   We strive to give you quality time with your provider. You may need to reschedule your appointment if you arrive late (15 or more minutes).  Arriving late affects you and other patients whose appointments are after yours.  Also, if you miss three or more appointments without notifying the office, you may be dismissed from the clinic at the provider's discretion.      For prescription refill requests, have your pharmacy contact our office and allow 72 hours for refills to be completed.    Today you received the following chemotherapy and/or immunotherapy agents : Avastin , Leucovorin , 5FU   To help prevent nausea and vomiting after your treatment, we encourage you to take your nausea medication as directed.  BELOW ARE SYMPTOMS THAT SHOULD BE REPORTED IMMEDIATELY: *FEVER GREATER THAN 100.4 F (38 C) OR HIGHER *CHILLS OR SWEATING *NAUSEA AND VOMITING THAT IS NOT CONTROLLED WITH YOUR NAUSEA MEDICATION *UNUSUAL SHORTNESS OF BREATH *UNUSUAL BRUISING OR BLEEDING *URINARY PROBLEMS (pain or burning when urinating, or frequent urination) *BOWEL PROBLEMS (unusual diarrhea, constipation, pain near the anus) TENDERNESS IN MOUTH AND THROAT WITH OR WITHOUT PRESENCE OF ULCERS (sore throat, sores in mouth, or a toothache) UNUSUAL RASH, SWELLING OR PAIN  UNUSUAL VAGINAL DISCHARGE OR ITCHING   Items with * indicate a potential emergency and should be followed up as soon as possible or go to the Emergency Department if any problems should occur.  Please show the CHEMOTHERAPY ALERT CARD or IMMUNOTHERAPY ALERT CARD  at check-in to the Emergency Department and triage nurse.  Should you have questions after your visit or need to cancel or reschedule your appointment, please contact [facility] CANCER [place] AT [place]  [phone] and follow the prompts.  Office hours are 8:00 a.m. to 4:30 p.m. Monday - Friday. Please note that voicemails left after 4:00 p.m. may not be returned until the following business day.  We are closed weekends and major holidays. You have access to a nurse at all times for urgent questions. Please call the main number to the clinic [phone] and follow the prompts.  For any non-urgent questions, you may also contact your provider using MyChart. We now offer e-Visits for anyone 64 and older to request care online for non-urgent symptoms. For details visit [email or web address].   Also download the MyChart app! Go to the app store, search "MyChart", open the app, select [place], and log in with your MyChart username and password.  Due to Covid, a mask is required upon entering the hospital/clinic. If you do not have a mask, one will be given to you upon arrival. For doctor visits, patients may have 1 support person aged 36 or older with them. For treatment visits, patients cannot have anyone with them due to current Covid guidelines and our immunocompromised population.

## 2021-12-01 NOTE — Progress Notes (Signed)
Ok per MD to use urine from 2/13 for tx today.

## 2021-12-01 NOTE — Progress Notes (Signed)
Hematology/Oncology follow up note [phone] [phone]      Patient Care Team: Jearld Fenton, NP as PCP - General (Internal Medicine) Clent Jacks, RN as Oncology Nurse Navigator Chelsea Server, MD as Consulting Physician (Oncology)  REFERRING PROVIDER: Jearld Fenton, NP  CHIEF COMPLAINTS/REASON FOR VISIT:  metastatic colon cancer  HISTORY OF PRESENTING ILLNESS:   Chelsea [name] is a  76 y.o.  female with PMH listed below was seen in consultation at the request of  Jearld Fenton, NP  for evaluation of metastatic colon cancer.  Patient initially went to emergency room on 03/24/2021 for abdominal pain. 03/24/2021, CT scan of the abdomen showed marked bladder wall thickening with surrounding soft tissue stranding is noted.  Concerning for cystitis.  There is a lobulated mass between the posterior wall of the bladder and the rectum which appears to arise from the vaginal cuff.  This is indeterminate and difficult to characterize reflecting lack of IV contrast material.  Nonobstructing left renal calculus, left sacroiliitis, lumbar spondylosis aortic atherosclerosis. 03/24/2021 CT pelvic showed abdominal [place] soft tissue of right cecum, concerning for colon carcinoma.  Colonoscopy recommended.  Low-attenuation mass in the region of vaginal cuff is again noted.  Diffuse urinary bladder wall thickening and mild bilateral hydroureter possibly cystitis.  [place] 03/24/2021 Lobulated solid 3.8 cm mass confirmed by ultrasound. This is most compatible with a neoplasm but remains indeterminate as to the origin as the epicenter seems to be outside of both the vaginal cuff and the adjacent colon, while the lesion appears inseparable from both. Extensive echogenic debris within the distended urinary bladder.   03/26/2021, patient was seen by Dr. Theora Gianotti for evaluation of colonic/vaginal cuff pelvic mass.  Patient also was referred to gastroenterology for  colonoscopy.  03/26/2021 CEA 3.4.  CA125 9.7 04/04/2021, status post colonoscopy which showed a frond like /villous nonobstructing large mass was found in the cecum, this was biopsied.  Terminal ileum was briefly intubated and appeared normal.  Diverticulosis in the sigmoid colon.  The rectum, sigmoid colon, descending colon, transverse colon and ascending colon were normal.  Nonbleeding internal hemorrhoids. 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    Given that the biopsy may not be representative of the entire underlying lesion.  Patient was recommended to establish with surgeon for evaluation. Patient no showed for her surgery appointment and as well as her follow-up appointment with gastroenterology.  Per daughter, patient was having cervical spine surgery and she prioritized that over the colon surgery.  08/19/2021, patient establish care with surgery Dr. Christian Mate 08/21/2021, CT abdomen pelvis with contrast  showed soft tissue mass at the base of cecum measuring up to 5 cm, slightly increased in size.  Interval development of multiple low-density lesions throughout the liver highly suggestive for metastatic disease.  Complex mass in the region of the vaginal cuff measuring up to 5.2 cm, increased in size.  Right ovary also appears more prominent on the current examination.  Moderate large volume of stool throughout the colon.  Mild urinary bladder wall thickening.  Colonic diverticulosis without evidence of active diverticulitis.  Aortic atherosclerosis  09/04/2021, patient is status post left lobe liver lesion biopsy and pathology showed metastatic adenocarcinoma, compatible with colorectal primary.  She was referred to establish care with oncology. She was accompanied by her daughter today.  Patient walks with a walker.+ Night sweats + weight loss.  Denies any abdominal pain currently, melena, blood in the stool.  She is currently being  followed by home health  PT/OT following a previous spinal surgery  10/10/2020, status post Mediport placement. 10/20/2020, cycle one 5-FU/bevacizumab.  Foundation one liquid biopsy testing showed KRASG12V, APC TP53 TMB 4  INTERVAL HISTORY Chelsea BARBONE is a 76 y.o. female who has above history reviewed by me today presents for follow up visit for management of metastatic colon cancer [name]. Patient reports 1 episode of loose bowel movements and vomiting a few days after last treatment.  All symptoms have resolved. Her weight is close to her baseline at 178.  Review of Systems  Constitutional:  Positive for fatigue and unexpected weight change. Negative for appetite change, chills and fever.  HENT:   Negative for hearing loss and voice change.   Eyes:  Negative for eye problems.  Respiratory:  Negative for chest tightness and cough.   Cardiovascular:  Negative for chest pain.  Gastrointestinal:  Negative for abdominal distention, abdominal pain, blood in stool and diarrhea.  Endocrine: Negative for hot flashes.  Genitourinary:  Negative for difficulty urinating and frequency.   Musculoskeletal:  Positive for back pain. Negative for arthralgias.  Skin:  Negative for itching and rash.  Neurological:  Positive for numbness (chornic finger tips). Negative for extremity weakness.  Hematological:  Negative for adenopathy.  Psychiatric/Behavioral:  Negative for confusion.    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)    Moderate mitral insufficiency    Port-A-Cath in place    Seizures [facility])    Spinal stenosis    Ulcer    Urinary incontinence     SURGICAL HISTORY: Past Surgical History:  Procedure Laterality Date   ABDOMINAL HYSTERECTOMY     BACK SURGERY     due to polio   BREAST BIOPSY Bilateral    neg   BREAST  BIOPSY Right 2011   neg/stereo   CARPAL TUNNEL RELEASE     CATARACT EXTRACTION Right 2020   COLONOSCOPY WITH PROPOFOL N/A 04/04/2021   Procedure: COLONOSCOPY WITH PROPOFOL;  Surgeon: Virgel Manifold, MD;  Location: [facility] ENDOSCOPY;  Service: Endoscopy;  Laterality: N/A;   EYE SURGERY     IR IMAGING GUIDED PORT INSERTION  10/09/2021    SOCIAL HISTORY: Social History   Socioeconomic History   Marital status: Divorced    Spouse name: Not on file   Number of children: Not on file   Years of education: Not on file   Highest education level: Not on file  Occupational History   Not on file  Tobacco Use   Smoking status: Never   Smokeless tobacco: Never  Vaping Use   Vaping Use: Never used  Substance and Sexual Activity   Alcohol use: Never   Drug use: No   Sexual activity: Not on file  Other Topics Concern   Not on file  Social History Narrative   Not on file   Social Determinants of Health   Financial Resource Strain: Not on file  Food Insecurity: Not on file  Transportation Needs: Not on file  Physical Activity: Not on file  Stress: Not on file  Social Connections: Not on file  Intimate Partner Violence: Not on file    FAMILY HISTORY: Family History  Problem Relation Age of Onset   Arthritis Mother  Heart disease Mother    Stroke Mother    Hypertension Mother    COPD Mother    Sudden death Sister    Other Father        unknown medical history   Breast cancer Neg Hx     ALLERGIES:  is allergic to penicillins.  MEDICATIONS:  Current Outpatient Medications  Medication Sig Dispense Refill   acetaminophen (TYLENOL) 500 MG tablet Take 500 mg by mouth every 6 (six) hours as needed.     ALPRAZolam (XANAX) 0.5 MG tablet TAKE 1 TABLET BY MOUTH TWICE A DAY AS NEEDED FOR ANXIETY 60 tablet 0   aspirin 325 MG tablet Take 325 mg by mouth daily.     atorvastatin (LIPITOR) 10 MG tablet Take 1 tablet (10 mg total) by mouth daily. 30 tablet 2   busPIRone (BUSPAR) 5 MG  tablet Take 1 tablet (5 mg total) by mouth 3 (three) times daily. 270 tablet 1   Cyanocobalamin (VITAMIN B 12 PO) Take 500 mcg by mouth daily.     diclofenac Sodium (VOLTAREN) 1 % GEL Apply 4 g topically 4 (four) times daily. 4 g 0   FLUoxetine (PROZAC) 20 MG capsule Take 3 capsules (60 mg total) by mouth every morning. 270 capsule 1   gabapentin (NEURONTIN) 100 MG capsule TAKE 2 CAPSULES BY MOUTH 2 TIMES DAILY. 360 capsule 0   KLOR-CON M20 20 MEQ tablet TAKE 1 TABLET BY MOUTH EVERY DAY 30 tablet 1   lidocaine (LIDODERM) 5 % Place 1 patch onto the skin daily. Remove & Discard patch within 12 hours or as directed by MD 1 patch 0   lidocaine-prilocaine (EMLA) cream Apply small amount of cream to port site 1-2 hour prior to chemo treatment. 30 g 3   loperamide (IMODIUM) 2 MG capsule Take 1 capsule (2 mg total) by mouth See admin instructions. Initial: 4 mg, followed by 2 mg after each loose stool; maximum: 16 mg/day 60 capsule 0   meclizine (ANTIVERT) 12.5 MG tablet Take 1 tablet (12.5 mg total) by mouth 3 (three) times daily as needed for dizziness. 30 tablet 0   methocarbamol (ROBAXIN) 500 MG tablet Take 1 tablet (500 mg total) by mouth 2 (two) times daily as needed for muscle spasms. 60 tablet 0   ondansetron (ZOFRAN) 8 MG tablet Take 1 tablet (8 mg total) by mouth 2 (two) times daily as needed (Nausea or vomiting). 30 tablet 1   polyethylene glycol powder (GLYCOLAX/MIRALAX) 17 GM/SCOOP powder Take 17 g by mouth daily. 3350 g 1   prochlorperazine (COMPAZINE) 10 MG tablet Take 1 tablet (10 mg total) by mouth every 6 (six) hours as needed (Nausea or vomiting). 30 tablet 1   triamterene-hydrochlorothiazide (MAXZIDE-25) 37.5-25 MG tablet Take 1 tablet by mouth daily. 90 tablet 1   vitamin E 180 MG (400 UNITS) capsule Take 400 Units by mouth daily.     No current facility-administered medications for this visit.     PHYSICAL EXAMINATION: ECOG PERFORMANCE STATUS: 2 - Symptomatic, <50% confined to  bed Vitals:   12/01/21 0937  BP: 135/78  Pulse: 73  Temp: (!) 97.3 F (36.3 C)   Filed Weights   12/01/21 0937  Weight: 178 lb (80.7 kg)     Physical Exam Constitutional:      General: She is not in acute distress.    Comments: Patient sits in wheelchair  HENT:     Head: Normocephalic and atraumatic.  Eyes:     General: No scleral  icterus. Cardiovascular:     Rate and Rhythm: Normal rate and regular rhythm.     Heart sounds: Normal heart sounds.  Pulmonary:     Effort: Pulmonary effort is normal. No respiratory distress.     Breath sounds: No wheezing.  Abdominal:     General: Bowel sounds are normal. There is no distension.     Palpations: Abdomen is soft.  Musculoskeletal:        General: No deformity. Normal range of motion.     Cervical back: Normal range of motion and neck supple.  Skin:    General: Skin is warm and dry.     Findings: No erythema or rash.  Neurological:     Mental Status: She is alert and oriented to person, place, and time. Mental status is at baseline.     Cranial Nerves: No cranial nerve deficit.     Coordination: Coordination normal.  Psychiatric:        Mood and Affect: Mood normal.    LABORATORY DATA:  I have reviewed the data as listed Lab Results  Component Value Date   WBC 3.8 (L) 12/01/2021   HGB 10.4 (L) 12/01/2021   HCT 32.9 (L) 12/01/2021   MCV 95.6 12/01/2021   PLT 115 (L) 12/01/2021   Recent Labs    11/03/21 0830 11/17/21 0834 12/01/21 0902  NA 136 137 139  K 3.4* 3.5 3.4*  CL 98 102 103  CO2 _0 GLUCOSE 108* 105* 118*  BUN 20 26* 15  CREATININE 1.07* 0.92 0.90  CALCIUM 9.0 9.0 8.9  GFRNONAA 54* >60 >60  PROT 7.2 7.3 7.0  ALBUMIN 3.4* 3.7 3.3*  AST _1 ALT _2 ALKPHOS 86 72 75  BILITOT 0.6 0.2* 0.3    Iron/TIBC/Ferritin/ %Sat    Component Value Date/Time   FERRITIN 58 10/24/2020 0402       RADIOGRAPHIC STUDIES: I have personally reviewed the radiological images as listed and  agreed with the findings in the report. No results found.    ASSESSMENT & PLAN:  1. Metastatic colon cancer to liver [facility])    Cancer Staging  Metastatic colon cancer to liver [facility]) Staging form: Colon and Rectum - Neuroendocine Tumors, AJCC 8th Edition - Clinical stage from 09/16/2021: Stage IV (cTX, cNX, cM1) - Signed by Chelsea Server, MD on 09/16/2021  #Metastatic colon cancer to liver, 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  No sufficient tissue for NGS Liquid biopsy showed KRAS12V, APC, TP53 mutation, TMB 4.  CEA is trending down.  Labs reviewed and discussed with patient We will proceed with cycle Labs are reviewed and discussed with patient. Proceed with cycle 5 5-FU/bevacizumab.  Plan to repeat CT images after 6 cycles of 5-FU/bevacizumab.  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  Biopsy of the vaginal cuff mass for more tissue may be able to be performed but probably require operative intervention. Liquid biopsy was performed instead.   #Depression, psychiatry referral #Hypokalemia, improved, continue potassium chloride 58mq daily.    #Chemotherapy-induced diarrhea.  Stable symptoms.   Recommend Imodium as needed as instructed.  Supportive care measures are necessary for patient well-being and will be provided as necessary. \All questions were answered. The patient knows to call the clinic with any problems questions or concerns.  cc BJearld Fenton NP   Follow-up in 2 weeks for lab NP 5-FU/bevacizumab. CT chest abdomen pelvis in 3 weeks. Follow-up in 4 weeks for lab  MD 5-FU/bevacizumab.  Chelsea Server, MD, PhD 12/01/2021

## 2021-12-02 LAB — CEA: CEA: 6.2 ng/mL — ABNORMAL HIGH (ref 0.0–4.7)

## 2021-12-03 ENCOUNTER — Telehealth: Payer: Self-pay | Admitting: *Deleted

## 2021-12-03 ENCOUNTER — Inpatient Hospital Stay: Payer: Medicare HMO | Attending: Oncology

## 2021-12-03 ENCOUNTER — Telehealth: Payer: Self-pay

## 2021-12-03 ENCOUNTER — Other Ambulatory Visit: Payer: Self-pay | Admitting: Internal Medicine

## 2021-12-03 ENCOUNTER — Other Ambulatory Visit: Payer: Self-pay

## 2021-12-03 ENCOUNTER — Encounter: Payer: Self-pay | Admitting: Oncology

## 2021-12-03 DIAGNOSIS — Z87440 Personal history of urinary (tract) infections: Secondary | ICD-10-CM | POA: Diagnosis not present

## 2021-12-03 DIAGNOSIS — G4733 Obstructive sleep apnea (adult) (pediatric): Secondary | ICD-10-CM | POA: Insufficient documentation

## 2021-12-03 DIAGNOSIS — Z79899 Other long term (current) drug therapy: Secondary | ICD-10-CM | POA: Insufficient documentation

## 2021-12-03 DIAGNOSIS — I700 Atherosclerosis of aorta: Secondary | ICD-10-CM | POA: Insufficient documentation

## 2021-12-03 DIAGNOSIS — Z7982 Long term (current) use of aspirin: Secondary | ICD-10-CM | POA: Insufficient documentation

## 2021-12-03 DIAGNOSIS — C180 Malignant neoplasm of cecum: Secondary | ICD-10-CM | POA: Diagnosis present

## 2021-12-03 DIAGNOSIS — C787 Secondary malignant neoplasm of liver and intrahepatic bile duct: Secondary | ICD-10-CM | POA: Insufficient documentation

## 2021-12-03 DIAGNOSIS — F411 Generalized anxiety disorder: Secondary | ICD-10-CM | POA: Diagnosis not present

## 2021-12-03 DIAGNOSIS — C7960 Secondary malignant neoplasm of unspecified ovary: Secondary | ICD-10-CM | POA: Insufficient documentation

## 2021-12-03 DIAGNOSIS — M4802 Spinal stenosis, cervical region: Secondary | ICD-10-CM | POA: Insufficient documentation

## 2021-12-03 DIAGNOSIS — F32A Depression, unspecified: Secondary | ICD-10-CM | POA: Diagnosis not present

## 2021-12-03 DIAGNOSIS — C189 Malignant neoplasm of colon, unspecified: Secondary | ICD-10-CM

## 2021-12-03 DIAGNOSIS — N1831 Chronic kidney disease, stage 3a: Secondary | ICD-10-CM | POA: Diagnosis not present

## 2021-12-03 DIAGNOSIS — Z9889 Other specified postprocedural states: Secondary | ICD-10-CM | POA: Diagnosis not present

## 2021-12-03 DIAGNOSIS — Z9221 Personal history of antineoplastic chemotherapy: Secondary | ICD-10-CM | POA: Insufficient documentation

## 2021-12-03 DIAGNOSIS — Z452 Encounter for adjustment and management of vascular access device: Secondary | ICD-10-CM | POA: Diagnosis not present

## 2021-12-03 DIAGNOSIS — I129 Hypertensive chronic kidney disease with stage 1 through stage 4 chronic kidney disease, or unspecified chronic kidney disease: Secondary | ICD-10-CM | POA: Diagnosis not present

## 2021-12-03 MED ORDER — HEPARIN SOD (PORK) LOCK FLUSH 100 UNIT/ML IV SOLN
500.0000 [IU] | Freq: Once | INTRAVENOUS | Status: AC | PRN
  Administered 2021-12-03: 500 [IU]
  Filled 2021-12-03: qty 5

## 2021-12-03 MED ORDER — SODIUM CHLORIDE 0.9% FLUSH
10.0000 mL | INTRAVENOUS | Status: DC | PRN
  Administered 2021-12-03: 10 mL
  Filled 2021-12-03: qty 10

## 2021-12-03 NOTE — Telephone Encounter (Signed)
I will have to relay that message to Chelsea [name] that not our call.

## 2021-12-03 NOTE — Telephone Encounter (Signed)
Pt called in to request a refill for ALPRAZolam Duanne Moron) 0.5 MG tablet   Pt says that she was told by her pharmacy that a request has been sent, not showing. Pt would like assistance with getting her Rx, pt says that she is a new cancer pt and is out of her medication.    Pharmacy:   [id number] [place], [place] - [place] Phone:  [phone]  Fax:  [phone]      Past ov: 04/28/22  Future ov: unknown

## 2021-12-03 NOTE — Telephone Encounter (Signed)
Pt coming 11:30 per Radonna Ricker ok to do

## 2021-12-03 NOTE — Telephone Encounter (Addendum)
Pt's daughter wanting to know if tx can be moved to 3/16 (since it can't be done sooner). However, that's a thurs, are you ok with her having tx on Friday 3/17 or the following week?

## 2021-12-03 NOTE — Telephone Encounter (Signed)
Received Mychart message from patient's daughter, [name], stating that they would lke appts r/s so patient can attend [facility] [place] in [place], [place]. Dr. Tasia Catchings ok with r/s appt. Patient came for pump dc today and was informed that appt may be moved out a few days but not sooner. She verbalized understanding and will wait for call with updated appts.   Please cancel appts on 3/13, 3/15, 3/27 & 3/29.   please schedule patient for lab/MD/ 5FU/ bev on  3/21 or 3/22 and dc pump Day 3. please inform pt of new appts.

## 2021-12-03 NOTE — Telephone Encounter (Signed)
Requested medication (s) are due for refill today: Yes  Requested medication (s) are on the active medication list: Yes  Last refill:  11/03/21  Future visit scheduled: Yes  Notes to clinic:  Unable to refill per protocol, cannot delegate. See notes from patient call.      Requested Prescriptions  Pending Prescriptions Disp Refills   ALPRAZolam (XANAX) 0.5 MG tablet 60 tablet 0     Not Delegated - Psychiatry: Anxiolytics/Hypnotics 2 Failed - 12/03/2021 10:25 AM      Failed - This refill cannot be delegated      Failed - Urine Drug Screen completed in last 360 days      Passed - Patient is not pregnant      Passed - Valid encounter within last 6 months    Recent Outpatient Visits           1 month ago Pure hypercholesterolemia   [place], Coralie Keens, NP   2 months ago 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 [facility])   [facility] [place], Coralie Keens, NP   4 months ago [facility] [place], Coralie Keens, NP   6 months ago Cervical myelopathy [facility])   [facility], Coralie Keens, NP   8 months ago Colonic mass   [facility] [place], Coralie Keens, NP       Future Appointments             In 4 months [place], Coralie Keens, NP [facility], [facility]

## 2021-12-03 NOTE — Telephone Encounter (Signed)
Patient has appointment today at 1 PM for pump removal she would like to come in as soon as possible her pump is empty and her transportation would like to bring her now.

## 2021-12-04 ENCOUNTER — Other Ambulatory Visit: Payer: Self-pay | Admitting: Internal Medicine

## 2021-12-04 ENCOUNTER — Encounter: Payer: Self-pay | Admitting: Oncology

## 2021-12-04 DIAGNOSIS — F411 Generalized anxiety disorder: Secondary | ICD-10-CM

## 2021-12-04 MED ORDER — ALPRAZOLAM 0.5 MG PO TABS: ORAL_TABLET | ORAL | 0 refills | Status: DC

## 2021-12-12 ENCOUNTER — Encounter: Payer: Self-pay | Admitting: Oncology

## 2021-12-12 NOTE — Addendum Note (Signed)
Addended by: [name] on: 12/12/2021 11:01 PM   Modules accepted: Orders

## 2021-12-15 ENCOUNTER — Ambulatory Visit: Payer: Medicare HMO

## 2021-12-15 ENCOUNTER — Ambulatory Visit: Payer: Medicare HMO | Admitting: Oncology

## 2021-12-15 ENCOUNTER — Other Ambulatory Visit: Payer: Medicare HMO

## 2021-12-22 ENCOUNTER — Other Ambulatory Visit: Payer: Self-pay

## 2021-12-22 ENCOUNTER — Telehealth: Payer: Self-pay | Admitting: *Deleted

## 2021-12-22 ENCOUNTER — Ambulatory Visit
Admission: RE | Admit: 2021-12-22 | Discharge: 2021-12-22 | Disposition: A | Discharge status: Home / Self Care | Payer: Medicare HMO | Source: Ambulatory Visit | Attending: Oncology | Admitting: Oncology

## 2021-12-22 DIAGNOSIS — C189 Malignant neoplasm of colon, unspecified: Secondary | ICD-10-CM | POA: Insufficient documentation

## 2021-12-22 DIAGNOSIS — C787 Secondary malignant neoplasm of liver and intrahepatic bile duct: Secondary | ICD-10-CM | POA: Diagnosis present

## 2021-12-22 IMAGING — CT CT CHEST-ABD-PELV W/ CM
2 of 5 series · 13 of 36 positions shown, 15 images · IV contrast (agent unspecified)
Comparison: Multiple priors, most recently chest CT [DATE]. CT
the abdomen and pelvis [DATE].

CLINICAL DATA: 75-year-old female with history of metastatic colon
cancer.

* Tracking Code: BO *
EXAM:
CT CHEST, ABDOMEN, AND PELVIS WITH CONTRAST
TECHNIQUE: Multidetector CT imaging of the chest, abdomen and pelvis was
performed following the standard protocol during bolus
administration of intravenous contrast.

[Series 2: cap with · axial · 0.82mm/px · z∈[-317,+133]mm · 10 of 110 slices shown, 12 images]
[im 10/110  mediastinal]
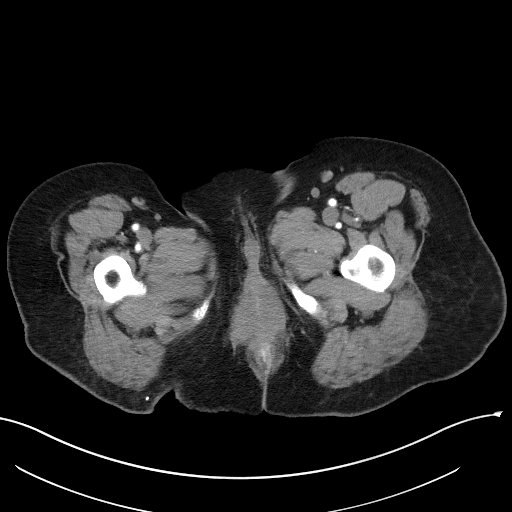
[im 10/110  bone]
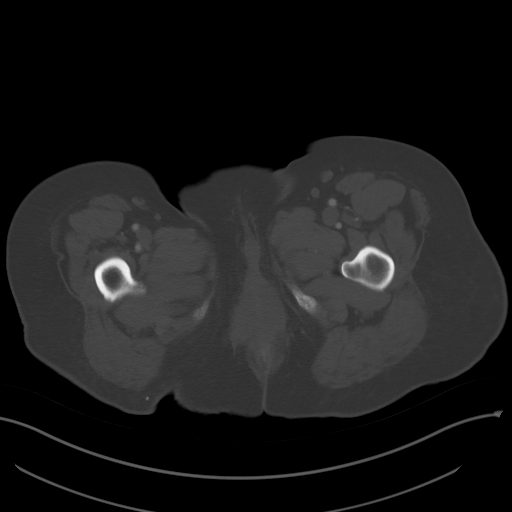
[im 20/110  mediastinal]
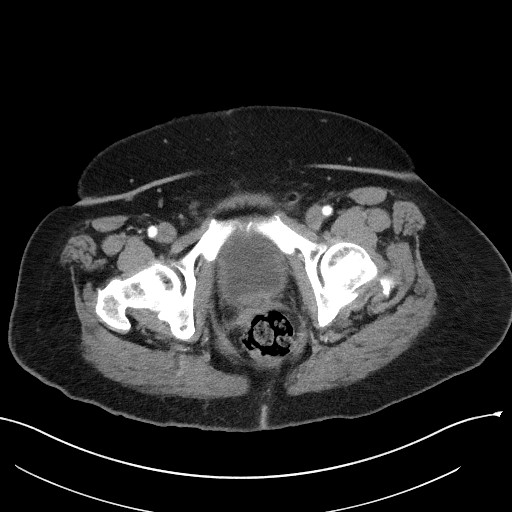
[im 30/110  mediastinal]
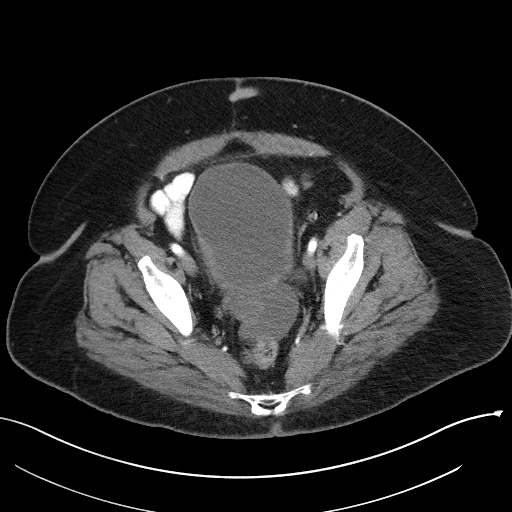
[im 40/110  mediastinal]
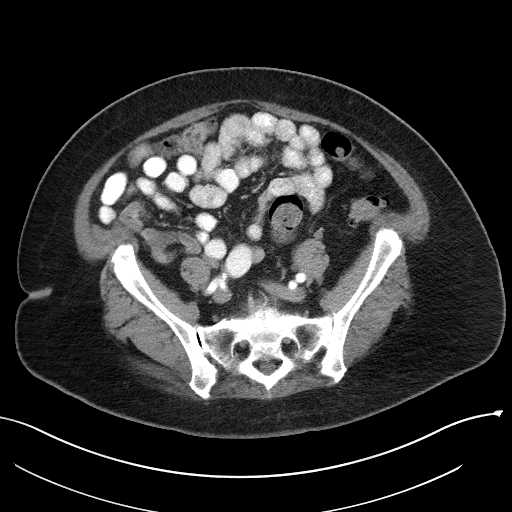
[im 50/110  mediastinal]
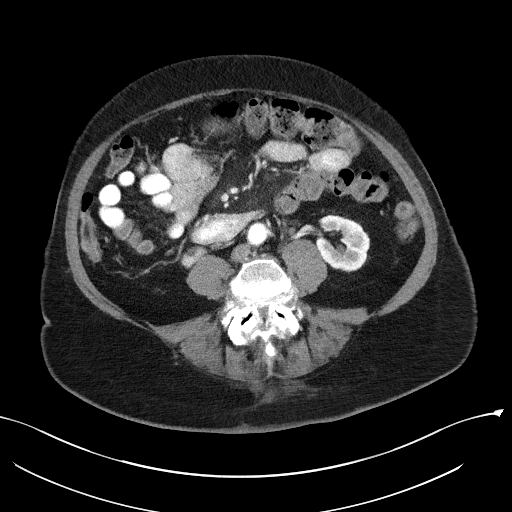
[im 60/110  mediastinal]
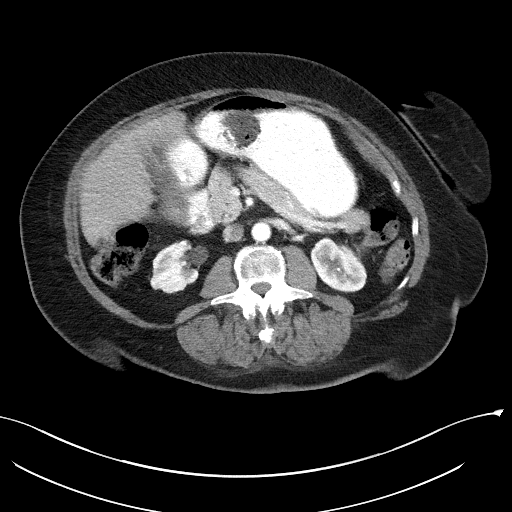
[im 70/110  mediastinal]
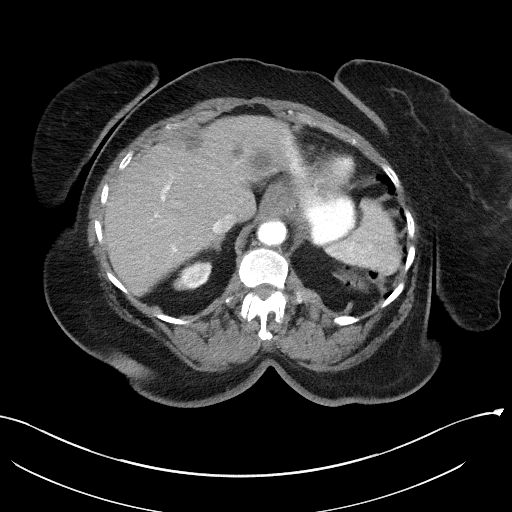
[im 80/110  mediastinal]
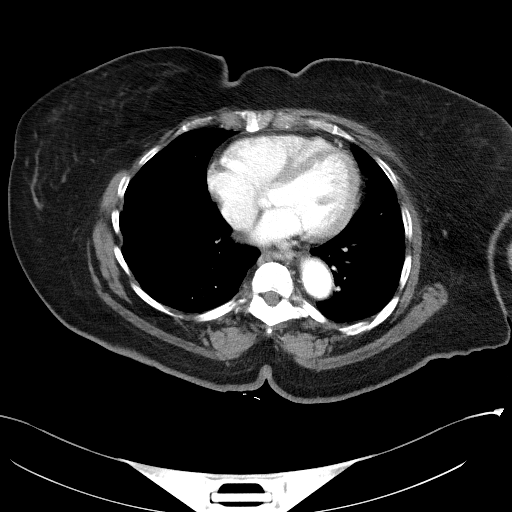
[im 90/110  mediastinal]
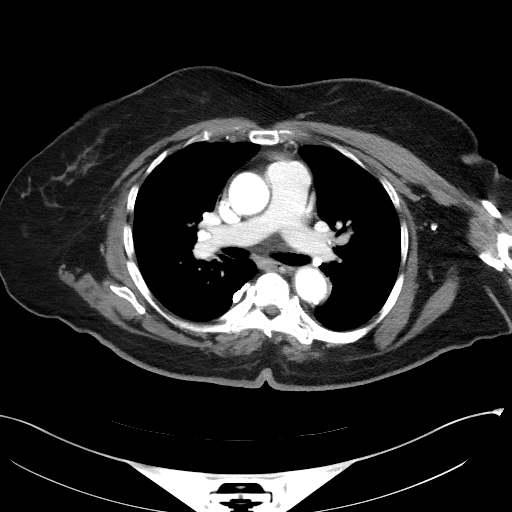
[im 90/110  bone]
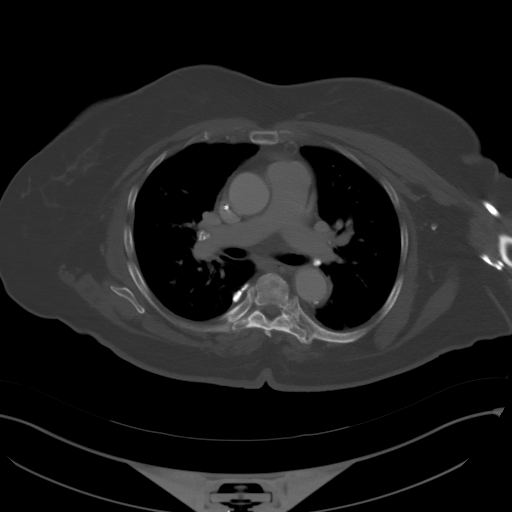
[im 100/110  mediastinal]
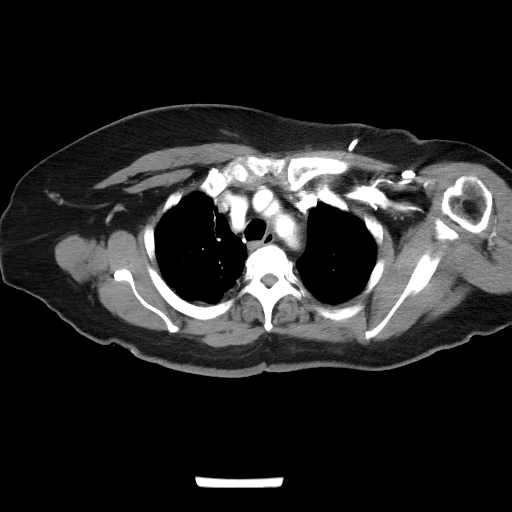

[Series 6: coronals · coronal · 0.98mm/px · 3 of 166 slices shown]
[im 34/166  mediastinal]
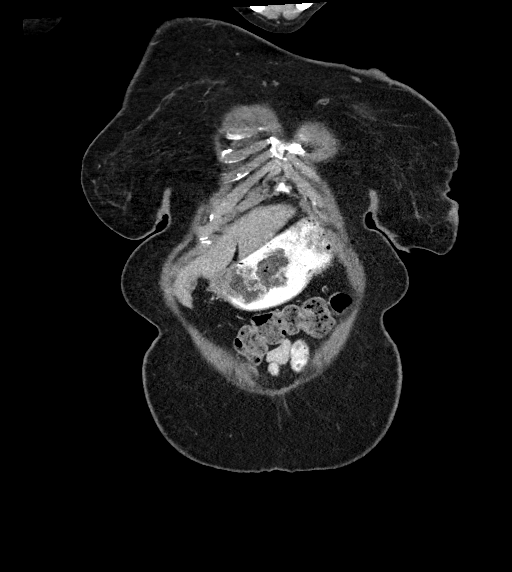
[im 67/166  mediastinal]
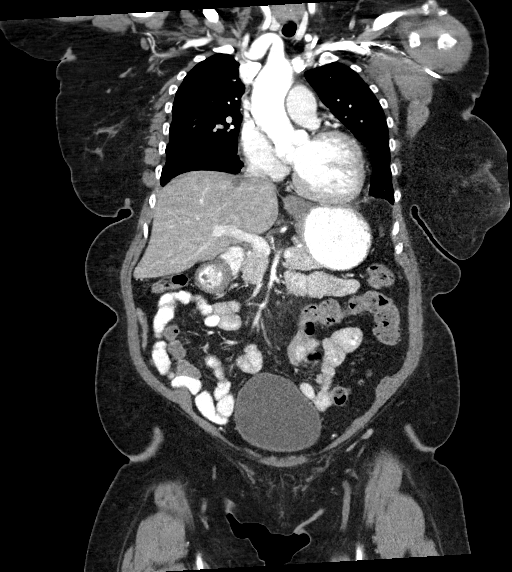
[im 100/166  mediastinal]
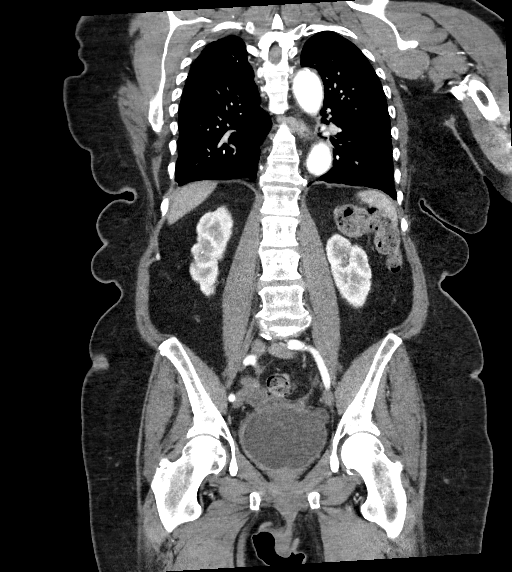

[13 of 36 positions shown; findings below may reference images not displayed]

RADIATION DOSE REDUCTION: This exam was performed according to the
departmental dose-optimization program which includes automated
exposure control, adjustment of the mA and/or kV according to
patient size and/or use of iterative reconstruction technique.

CONTRAST:  100mL OMNIPAQUE IOHEXOL 300 MG/ML  SOLN
FINDINGS: CT CHEST FINDINGS

Cardiovascular: Heart size is normal. There is no significant
pericardial fluid, thickening or pericardial calcification. Aortic
atherosclerosis. No definite coronary artery calcifications.
Left-sided internal jugular single-lumen porta cath with tip
terminating at the superior cavoatrial junction.

Mediastinum/Nodes: No pathologically enlarged mediastinal or hilar
lymph nodes. Multiple densely calcified mediastinal and bilateral
hilar lymph nodes are incidentally noted. Esophagus is unremarkable
in appearance. No axillary lymphadenopathy.

Lungs/Pleura: Calcified granulomas are noted in the lungs
bilaterally. No other definite suspicious appearing pulmonary
nodules or masses are noted. No acute consolidative airspace
disease. No pleural effusions. Scarring in the apex of the right
lung and in the periphery of the left lung base, similar to the
prior study.

Musculoskeletal: Kyphotic deformity of the midthoracic spine again
noted with apparent congenital fusion of T6, T7 and T8. There are no
aggressive appearing lytic or blastic lesions noted in the
visualized portions of the skeleton.

CT ABDOMEN PELVIS FINDINGS

Hepatobiliary: Increased number and size of numerous hypovascular
lesions scattered throughout the liver, indicative of progressive
metastatic disease, largest of which is located between segments 2
and 3 in the left lobe of the liver (axial image 43 of series 2)
measuring 3.1 x 2.3 cm on today's study (previously only 7 mm). No
intra or extrahepatic biliary ductal dilatation. Gallbladder is
nearly decompressed, but otherwise unremarkable in appearance.

Pancreas: No pancreatic mass. No pancreatic ductal dilatation. No
pancreatic or peripancreatic fluid collections or inflammatory
changes.

Spleen: Unremarkable.

Adrenals/Urinary Tract: 3 mm nonobstructive calculus in the upper
pole of the left kidney and interpolar collecting system of the
right kidney. Multifocal cortical thinning in both kidneys. No
suspicious renal lesions. No hydroureteronephrosis. Urinary bladder
is normal in appearance. Bilateral adrenal glands are normal in
appearance.

Stomach/Bowel: Normal appearance of the stomach. No pathologic
dilatation of small bowel or colon. A few scattered colonic
diverticulae are noted, without surrounding inflammatory changes to
suggest an acute diverticulitis at this time. Some regression of
previously noted cecal mass (axial image 67 of series 2) which
currently measures approximately 3.2 x 2.3 cm, suggesting positive
response to therapy. Appendix is not visualized, likely surgically
absent.

Vascular/Lymphatic: Aortic atherosclerosis, without evidence of
aneurysm or dissection in the abdominal or pelvic vasculature. No
definite lymphadenopathy confidently identified in the abdomen or
pelvis.

Reproductive: Status post hysterectomy. Again noted is a large soft
tissue mass in the region of the vaginal cuff which has increased
substantially in size (axial image 82 of series 2 and sagittal image
139 of series 7), currently measuring 5.8 x 4.3 x 5.9 cm. This
exerts local mass effect upon adjacent structures displacing the
urinary bladder slightly anteriorly and the rectum posteriorly.
Ovaries are unremarkable in appearance.

Other: No significant volume of ascites.  No pneumoperitoneum.

Musculoskeletal: There are no aggressive appearing lytic or blastic
lesions noted in the visualized portions of the skeleton.
IMPRESSION: 1. Today's study demonstrates a mixed response to therapy.
Specifically, the previously described cecal mass appears smaller
than the prior study, however, there is increased number and size of
metastatic lesions throughout the liver.
2. In addition, there has been interval enlargement of a mass in the
low anatomic pelvis which is intimately associated with the vaginal
cuff. Whether this represents a metastatic deposit in the low
anatomic pelvis, or a primary lesion of the cervix or vagina is
uncertain. Regardless, this is clearly enlarged, currently measuring
5.8 x 4.3 x 5.9 cm.
3. No definitive imaging findings to suggest metastatic disease in
the thorax.
4. Old granulomatous disease, as above.
5. Aortic atherosclerosis.
6. Nonobstructive calculi measuring 3 mm in both renal collecting
systems.
7. Additional incidental findings, as above.

## 2021-12-22 MED ORDER — IOHEXOL 300 MG/ML  SOLN
100.0000 mL | Freq: Once | INTRAMUSCULAR | Status: AC | PRN
  Administered 2021-12-22: 100 mL via INTRAVENOUS

## 2021-12-22 NOTE — Telephone Encounter (Signed)
Daughter called asking if the CT today should have been rescheduled when patient 4th  chemotherapy appointment was changed or should she keep appointment for CT today.  Please advise since she was to have the CT after 4th treatment which will not be until 12/24/21

## 2021-12-24 ENCOUNTER — Inpatient Hospital Stay: Payer: Medicare HMO

## 2021-12-24 ENCOUNTER — Encounter: Payer: Self-pay | Admitting: Oncology

## 2021-12-24 ENCOUNTER — Telehealth: Payer: Self-pay

## 2021-12-24 ENCOUNTER — Inpatient Hospital Stay (HOSPITAL_BASED_OUTPATIENT_CLINIC_OR_DEPARTMENT_OTHER): Payer: Medicare HMO | Admitting: Hospice and Palliative Medicine

## 2021-12-24 ENCOUNTER — Other Ambulatory Visit: Payer: Self-pay

## 2021-12-24 ENCOUNTER — Inpatient Hospital Stay (HOSPITAL_BASED_OUTPATIENT_CLINIC_OR_DEPARTMENT_OTHER): Payer: Medicare HMO | Admitting: Oncology

## 2021-12-24 VITALS — BP 120/72 | HR 74 | Temp 96.9°F | Resp 18 | Wt 174.4 lb

## 2021-12-24 DIAGNOSIS — C189 Malignant neoplasm of colon, unspecified: Secondary | ICD-10-CM

## 2021-12-24 DIAGNOSIS — C180 Malignant neoplasm of cecum: Secondary | ICD-10-CM | POA: Diagnosis not present

## 2021-12-24 DIAGNOSIS — N898 Other specified noninflammatory disorders of vagina: Secondary | ICD-10-CM

## 2021-12-24 DIAGNOSIS — D649 Anemia, unspecified: Secondary | ICD-10-CM | POA: Diagnosis not present

## 2021-12-24 DIAGNOSIS — C787 Secondary malignant neoplasm of liver and intrahepatic bile duct: Secondary | ICD-10-CM

## 2021-12-24 DIAGNOSIS — Z5111 Encounter for antineoplastic chemotherapy: Secondary | ICD-10-CM | POA: Diagnosis not present

## 2021-12-24 DIAGNOSIS — E876 Hypokalemia: Secondary | ICD-10-CM | POA: Diagnosis not present

## 2021-12-24 LAB — COMPREHENSIVE METABOLIC PANEL
ALT: 15 U/L (ref 0–44)
AST: 21 U/L (ref 15–41)
Albumin: 3.7 g/dL (ref 3.5–5.0)
Alkaline Phosphatase: 75 U/L (ref 38–126)
Anion gap: 7 (ref 5–15)
BUN: 15 mg/dL (ref 8–23)
CO2: 30 mmol/L (ref 22–32)
Calcium: 9.3 mg/dL (ref 8.9–10.3)
Chloride: 100 mmol/L (ref 98–111)
Creatinine, Ser: 0.98 mg/dL (ref 0.44–1.00)
GFR, Estimated: 60 mL/min — ABNORMAL LOW (ref >60)
Glucose, Bld: 91 mg/dL (ref 70–99)
Potassium: 3.6 mmol/L (ref 3.5–5.1)
Sodium: 137 mmol/L (ref 135–145)
Total Bilirubin: 0.6 mg/dL (ref 0.3–1.2)
Total Protein: 7.2 g/dL (ref 6.5–8.1)

## 2021-12-24 LAB — CBC WITH DIFFERENTIAL/PLATELET
Abs Immature Granulocytes: 0 10*3/uL (ref 0.00–0.07)
Basophils Absolute: 0 10*3/uL (ref 0.0–0.1)
Basophils Relative: 1 %
Eosinophils Absolute: 0.1 10*3/uL (ref 0.0–0.5)
Eosinophils Relative: 3 %
HCT: 35.6 % — ABNORMAL LOW (ref 36.0–46.0)
Hemoglobin: 11.1 g/dL — ABNORMAL LOW (ref 12.0–15.0)
Immature Granulocytes: 0 %
Lymphocytes Relative: 55 %
Lymphs Abs: 2.1 10*3/uL (ref 0.7–4.0)
MCH: 30.5 pg (ref 26.0–34.0)
MCHC: 31.2 g/dL (ref 30.0–36.0)
MCV: 97.8 fL (ref 80.0–100.0)
Monocytes Absolute: 0.7 10*3/uL (ref 0.1–1.0)
Monocytes Relative: 18 %
Neutro Abs: 0.9 10*3/uL — ABNORMAL LOW (ref 1.7–7.7)
Neutrophils Relative %: 23 %
Platelets: 234 10*3/uL (ref 150–400)
RBC: 3.64 MIL/uL — ABNORMAL LOW (ref 3.87–5.11)
RDW: 19.5 % — ABNORMAL HIGH (ref 11.5–15.5)
WBC: 3.8 10*3/uL — ABNORMAL LOW (ref 4.0–10.5)
nRBC: 0 % (ref 0.0–0.2)

## 2021-12-24 MED ORDER — HEPARIN SOD (PORK) LOCK FLUSH 100 UNIT/ML IV SOLN
500.0000 [IU] | Freq: Once | INTRAVENOUS | Status: AC
  Administered 2021-12-24: 500 [IU] via INTRAVENOUS
  Filled 2021-12-24: qty 5

## 2021-12-24 NOTE — Progress Notes (Signed)
[place] at [facility] [phone] [phone]   Name: Chelsea [name] Date: 12/24/2021 MRN: [id number]  DOB: Jan 07, 1946  Patient Care Team: Chelsea Fenton, NP as PCP - General (Internal Medicine) Chelsea Jacks, RN as Oncology Nurse Navigator Chelsea Server, MD as Consulting Physician (Oncology)    REASON FOR CONSULTATION: Chelsea [name] is a 76 y.o. 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  Palliative care was consulted to help address goals.  SOCIAL HISTORY:     reports that she has never smoked. She has never used smokeless tobacco. She reports that she does not drink alcohol and does not use drugs.  Patient [facility] at home alone at [facility].  She has a daughter who is involved in her care.  ADVANCE DIRECTIVES:  Not on file  CODE STATUS:   PAST MEDICAL HISTORY: 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)    Moderate mitral insufficiency    Port-A-Cath in place    Seizures ([place])    Spinal stenosis    Ulcer    Urinary incontinence     PAST SURGICAL HISTORY:  Past Surgical History:  Procedure Laterality Date   ABDOMINAL HYSTERECTOMY     BACK SURGERY     due to polio   BREAST BIOPSY Bilateral    neg   BREAST BIOPSY Right 2011   neg/stereo   CARPAL TUNNEL RELEASE     CATARACT EXTRACTION Right 2020   COLONOSCOPY WITH PROPOFOL N/A 04/04/2021   Procedure: COLONOSCOPY WITH PROPOFOL;  Surgeon: Virgel Manifold, MD;  Location: [facility] ENDOSCOPY;  Service: Endoscopy;  Laterality: N/A;   EYE SURGERY     IR IMAGING GUIDED PORT INSERTION  10/09/2021    HEMATOLOGY/ONCOLOGY HISTORY:  Oncology History  Metastatic colon cancer to liver ([place])  09/16/2021 Initial Diagnosis    Metastatic colon cancer to liver ([place])   09/16/2021 Cancer Staging   Staging form: Colon and Rectum - Neuroendocine Tumors, AJCC 8th Edition - Clinical stage from 09/16/2021: Stage IV (cTX, cNX, cM1) - Signed by Chelsea Server, MD on 09/16/2021 Stage prefix: Initial diagnosis   10/17/2021 -  Chemotherapy   Patient is on Treatment Plan : COLORECTAL 5FU / Leucovorin (Modified deGramont) + Bevacizumab q14d       ALLERGIES:  is allergic to penicillins.  MEDICATIONS:  Current Outpatient Medications  Medication Sig Dispense Refill   acetaminophen (TYLENOL) 500 MG tablet Take 500 mg by mouth every 6 (six) hours as needed.     ALPRAZolam (XANAX) 0.5 MG tablet TAKE 1 TABLET BY MOUTH TWICE A DAY AS NEEDED FOR ANXIETY 60 tablet 0   aspirin 325 MG tablet Take 325 mg by mouth daily.     atorvastatin (LIPITOR) 10 MG tablet Take 1 tablet (10 mg total) by mouth daily. 30 tablet 2   busPIRone (BUSPAR) 5 MG tablet Take 1 tablet (5 mg total) by mouth 3 (three) times daily. 270 tablet 1   Cyanocobalamin (VITAMIN B 12 PO) Take 500 mcg by mouth daily.     diclofenac Sodium (VOLTAREN) 1 % GEL Apply 4 g topically 4 (four) times daily. 4 g 0   FLUoxetine (PROZAC) 20 MG capsule Take 3 capsules (60  mg total) by mouth every morning. 270 capsule 1   gabapentin (NEURONTIN) 100 MG capsule TAKE 2 CAPSULES BY MOUTH 2 TIMES DAILY. 360 capsule 0   KLOR-CON M20 20 MEQ tablet TAKE 1 TABLET BY MOUTH EVERY DAY 30 tablet 1   lidocaine (LIDODERM) 5 % Place 1 patch onto the skin daily. Remove & Discard patch within 12 hours or as directed by MD (Patient not taking: Reported on 12/24/2021) 1 patch 0   lidocaine-prilocaine (EMLA) cream Apply small amount of cream to port site 1-2 hour prior to chemo treatment. 30 g 3   loperamide (IMODIUM) 2 MG capsule Take 1 capsule (2 mg total) by mouth See admin instructions. Initial: 4 mg, followed by 2 mg after each loose stool; maximum: 16 mg/day (Patient not taking: Reported on 12/24/2021) 60  capsule 0   meclizine (ANTIVERT) 12.5 MG tablet Take 1 tablet (12.5 mg total) by mouth 3 (three) times daily as needed for dizziness. 30 tablet 0   methocarbamol (ROBAXIN) 500 MG tablet Take 1 tablet (500 mg total) by mouth 2 (two) times daily as needed for muscle spasms. 60 tablet 0   ondansetron (ZOFRAN) 8 MG tablet Take 1 tablet (8 mg total) by mouth 2 (two) times daily as needed (Nausea or vomiting). 30 tablet 1   polyethylene glycol powder (GLYCOLAX/MIRALAX) 17 GM/SCOOP powder Take 17 g by mouth daily. 3350 g 1   prochlorperazine (COMPAZINE) 10 MG tablet Take 1 tablet (10 mg total) by mouth every 6 (six) hours as needed (Nausea or vomiting). 30 tablet 1   triamterene-hydrochlorothiazide (MAXZIDE-25) 37.5-25 MG tablet Take 1 tablet by mouth daily. 90 tablet 1   vitamin E 180 MG (400 UNITS) capsule Take 400 Units by mouth daily.     No current facility-administered medications for this visit.    VITAL SIGNS: There were no vitals taken for this visit. There were no vitals filed for this visit.  Estimated body mass index is 37.74 kg/m as calculated from the following:   Height as of 10/28/21: '4\' 9"'$  (1.448 m).   Weight as of an earlier encounter on 12/24/21: 174 lb 6.4 oz (79.1 kg).  LABS: CBC:    Component Value Date/Time   WBC 3.8 (L) 12/24/2021 0752   HGB 11.1 (L) 12/24/2021 0752   HCT 35.6 (L) 12/24/2021 0752   PLT 234 12/24/2021 0752   MCV 97.8 12/24/2021 0752   NEUTROABS 0.9 (L) 12/24/2021 0752   LYMPHSABS 2.1 12/24/2021 0752   MONOABS 0.7 12/24/2021 0752   EOSABS 0.1 12/24/2021 0752   BASOSABS 0.0 12/24/2021 0752   Comprehensive Metabolic Panel:    Component Value Date/Time   NA 137 12/24/2021 0752   K 3.6 12/24/2021 0752   CL 100 12/24/2021 0752   CO2 30 12/24/2021 0752   BUN 15 12/24/2021 0752   CREATININE 0.98 12/24/2021 0752   CREATININE 0.87 08/13/2020 1129   GLUCOSE 91 12/24/2021 0752   CALCIUM 9.3 12/24/2021 0752   AST 21 12/24/2021 0752   ALT 15 12/24/2021  0752   ALKPHOS 75 12/24/2021 0752   BILITOT 0.6 12/24/2021 0752   PROT 7.2 12/24/2021 0752   ALBUMIN 3.7 12/24/2021 0752    RADIOGRAPHIC STUDIES: CT CHEST ABDOMEN PELVIS W CONTRAST  Result Date: 12/23/2021 CLINICAL DATA:  76 year old female with history of metastatic colon cancer. * Tracking Code: BO * EXAM: CT CHEST, ABDOMEN, AND PELVIS WITH CONTRAST TECHNIQUE: Multidetector CT imaging of the chest, abdomen and pelvis was performed following the standard protocol during  bolus administration of intravenous contrast. RADIATION DOSE REDUCTION: This exam was performed according to the departmental dose-optimization program which includes automated exposure control, adjustment of the mA and/or kV according to patient size and/or use of iterative reconstruction technique. CONTRAST:  169m OMNIPAQUE IOHEXOL 300 MG/ML  SOLN COMPARISON:  Multiple priors, most recently chest CT 09/22/2021. CT the abdomen and pelvis 08/21/2021. FINDINGS: CT CHEST FINDINGS Cardiovascular: Heart size is normal. There is no significant pericardial fluid, thickening or pericardial calcification. Aortic atherosclerosis. No definite coronary artery calcifications. Left-sided internal jugular single-lumen porta cath with tip terminating at the superior cavoatrial junction. Mediastinum/Nodes: No pathologically enlarged mediastinal or hilar lymph nodes. Multiple densely calcified mediastinal and bilateral hilar lymph nodes are incidentally noted. Esophagus is unremarkable in appearance. No axillary lymphadenopathy. Lungs/Pleura: Calcified granulomas are noted in the lungs bilaterally. No other definite suspicious appearing pulmonary nodules or masses are noted. No acute consolidative airspace disease. No pleural effusions. Scarring in the apex of the right lung and in the periphery of the left lung base, similar to the prior study. Musculoskeletal: Kyphotic deformity of the midthoracic spine again noted with apparent congenital fusion of T6,  T7 and T8. There are no aggressive appearing lytic or blastic lesions noted in the visualized portions of the skeleton. CT ABDOMEN PELVIS FINDINGS Hepatobiliary: Increased number and size of numerous hypovascular lesions scattered throughout the liver, indicative of progressive metastatic disease, largest of which is located between segments 2 and 3 in the left lobe of the liver (axial image 43 of series 2) measuring 3.1 x 2.3 cm on today's study (previously only 7 mm). No intra or extrahepatic biliary ductal dilatation. Gallbladder is nearly decompressed, but otherwise unremarkable in appearance. Pancreas: No pancreatic mass. No pancreatic ductal dilatation. No pancreatic or peripancreatic fluid collections or inflammatory changes. Spleen: Unremarkable. Adrenals/Urinary Tract: 3 mm nonobstructive calculus in the upper pole of the left kidney and interpolar collecting system of the right kidney. Multifocal cortical thinning in both kidneys. No suspicious renal lesions. No hydroureteronephrosis. Urinary bladder is normal in appearance. Bilateral adrenal glands are normal in appearance. Stomach/Bowel: Normal appearance of the stomach. No pathologic dilatation of small bowel or colon. A few scattered colonic diverticulae are noted, without surrounding inflammatory changes to suggest an acute diverticulitis at this time. Some regression of previously noted cecal mass (axial image 67 of series 2) which currently measures approximately 3.2 x 2.3 cm, suggesting positive response to therapy. Appendix is not visualized, likely surgically absent. Vascular/Lymphatic: Aortic atherosclerosis, without evidence of aneurysm or dissection in the abdominal or pelvic vasculature. No definite lymphadenopathy confidently identified in the abdomen or pelvis. Reproductive: Status post hysterectomy. Again noted is a large soft tissue mass in the region of the vaginal cuff which has increased substantially in size (axial image 82 of series  2 and sagittal image 139 of series 7), currently measuring 5.8 x 4.3 x 5.9 cm. This exerts local mass effect upon adjacent structures displacing the urinary bladder slightly anteriorly and the rectum posteriorly. Ovaries are unremarkable in appearance. Other: No significant volume of ascites.  No pneumoperitoneum. Musculoskeletal: There are no aggressive appearing lytic or blastic lesions noted in the visualized portions of the skeleton. IMPRESSION: 1. Today's study demonstrates a mixed response to therapy. Specifically, the previously described cecal mass appears smaller than the prior study, however, there is increased number and size of metastatic lesions throughout the liver. 2. In addition, there has been interval enlargement of a mass in the low anatomic pelvis which is intimately associated  with the vaginal cuff. Whether this represents a metastatic deposit in the low anatomic pelvis, or a primary lesion of the cervix or vagina is uncertain. Regardless, this is clearly enlarged, currently measuring 5.8 x 4.3 x 5.9 cm. 3. No definitive imaging findings to suggest metastatic disease in the thorax. 4. Old granulomatous disease, as above. 5. Aortic atherosclerosis. 6. Nonobstructive calculi measuring 3 mm in both renal collecting systems. 7. Additional incidental findings, as above. Electronically Signed   By: Vinnie Langton M.D.   On: 12/23/2021 06:54    PERFORMANCE STATUS (ECOG) : 3 - Symptomatic, >50% confined to bed  Review of Systems Unless otherwise noted, a complete review of systems is negative.  Physical Exam General: NAD Pulmonary: Unlabored Extremities: no edema, no joint deformities Skin: no rashes Neurological: Weakness but otherwise nonfocal  IMPRESSION: Patient was an add-on to my clinic schedule today at request of Dr. Tasia Catchings and family.  CT of the chest, abdomen, and pelvis on 12/22/2021 revealed mixed response to therapy with smaller cecal mass but increased number and size of  metastatic hepatic disease.  Patient is being referred to GYN oncology for consideration of biopsy of her vaginal cuff mass.  Daughter asked about resources if patient were not a candidate for further cancer treatment.  We discussed palliative care versus hospice.  However, patient states that her goals are currently aligned with continued treatment.  Patient and daughter are interested in community palliative care following.  They are also interested in speaking with social work regarding resources.  I will consult both.  Symptomatically, patient is doing reasonably well.  She endorses constipation and we discussed her bowel regimen in detail including use of MiraLAX and/or Senokot  PLAN: -Continue current scope of treatment -Referrals for palliative care and social work (daughter would like to be the one contacted) -Daily bowel regimen -MOST Form previously reviewed -Follow-up telephone visit 1 to 2 months  Case and plan discussed with Dr. Tasia Catchings  Patient expressed understanding and was in agreement with this plan. She also understands that She can call the clinic at any time with any questions, concerns, or complaints.     Time Total: 15 minutes  Visit consisted of counseling and education dealing with the complex and emotionally intense issues of symptom management and palliative care in the setting of serious and potentially life-threatening illness.Greater than 50%  of this time was spent counseling and coordinating care related to the above assessment and plan.  Signed by: Altha Harm, PhD, NP-C

## 2021-12-24 NOTE — Telephone Encounter (Signed)
I called Ms[employer] number on file but it says its not in service. I then called patient Daughter but she did not answer. I left a voicemail on daughter phone stating this call was about an upcoming appointment and just wants to make sure that all parties are made aware.

## 2021-12-24 NOTE — Progress Notes (Signed)
Pt here for follow up. No new concerns voiced.   

## 2021-12-24 NOTE — Progress Notes (Signed)
Hematology/Oncology Progress note [phone] [phone]         Patient Care Team: Jearld Fenton, NP as PCP - General (Internal Medicine) Clent Jacks, RN as Oncology Nurse Navigator [name], MD as Consulting Physician (Oncology)  REFERRING PROVIDER: Jearld Fenton, NP  CHIEF COMPLAINTS/REASON FOR VISIT:  metastatic colon cancer  HISTORY OF PRESENTING ILLNESS:   Chelsea [name] is a  76 y.o.  female with PMH listed below was seen in consultation at the request of  Jearld Fenton, NP  for evaluation of metastatic colon cancer.  Patient initially went to emergency room on 03/24/2021 for abdominal pain. 03/24/2021, CT scan of the abdomen showed marked bladder wall thickening with surrounding soft tissue stranding is noted.  Concerning for cystitis.  There is a lobulated mass between the posterior wall of the bladder and the rectum which appears to arise from the vaginal cuff.  This is indeterminate and difficult to characterize reflecting lack of IV contrast material.  Nonobstructing left renal calculus, left sacroiliitis, lumbar spondylosis aortic atherosclerosis. 03/24/2021 CT pelvic showed abdominal [place] soft tissue of right cecum, concerning for colon carcinoma.  Colonoscopy recommended.  Low-attenuation mass in the region of vaginal cuff is again noted.  Diffuse urinary bladder wall thickening and mild bilateral hydroureter possibly cystitis.  [place] 03/24/2021 Lobulated solid 3.8 cm mass confirmed by ultrasound. This is most compatible with a neoplasm but remains indeterminate as to the origin as the epicenter seems to be outside of both the vaginal cuff and the adjacent colon, while the lesion appears inseparable from both. Extensive echogenic debris within the distended urinary bladder.   03/26/2021, patient was seen by Dr. Theora Gianotti for evaluation of colonic/vaginal cuff pelvic mass.  Patient also was referred to gastroenterology for  colonoscopy.  03/26/2021 CEA 3.4.  CA125 9.7 04/04/2021, status post colonoscopy which showed a frond like /villous nonobstructing large mass was found in the cecum, this was biopsied.  Terminal ileum was briefly intubated and appeared normal.  Diverticulosis in the sigmoid colon.  The rectum, sigmoid colon, descending colon, transverse colon and ascending colon were normal.  Nonbleeding internal hemorrhoids. 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    Given that the biopsy may not be representative of the entire underlying lesion.  Patient was recommended to establish with surgeon for evaluation. Patient no showed for her surgery appointment and as well as her follow-up appointment with gastroenterology.  Per daughter, patient was having cervical spine surgery and she prioritized that over the colon surgery.  08/19/2021, patient establish care with surgery Dr. Christian Mate 08/21/2021, CT abdomen pelvis with contrast  showed soft tissue mass at the base of cecum measuring up to 5 cm, slightly increased in size.  Interval development of multiple low-density lesions throughout the liver highly suggestive for metastatic disease.  Complex mass in the region of the vaginal cuff measuring up to 5.2 cm, increased in size.  Right ovary also appears more prominent on the current examination.  Moderate large volume of stool throughout the colon.  Mild urinary bladder wall thickening.  Colonic diverticulosis without evidence of active diverticulitis.  Aortic atherosclerosis  09/04/2021, patient is status post left lobe liver lesion biopsy and pathology showed metastatic adenocarcinoma, compatible with colorectal primary.  She was referred to establish care with oncology. She was accompanied by her daughter today.  Patient walks with a walker.+ Night sweats + weight loss.  Denies any abdominal pain currently, melena, blood in the stool.  She is  currently being followed by home health  PT/OT following a previous spinal surgery  10/10/2020, status post Mediport placement. 10/20/2020, cycle one 5-FU/bevacizumab.  Foundation one liquid biopsy testing showed KRASG12V, APC TP53 TMB 4  INTERVAL HISTORY Chelsea [name] is a 76 y.o. female who has above history reviewed by me today presents for follow up visit for management of metastatic colon cancer Patient was accompanied by her daughter.  Patient tolerates chemotherapy. Denies nausea vomiting, diarrhea, abdominal pain. She feels good. No fever or chills.   Review of Systems  Constitutional:  Positive for fatigue and unexpected weight change. Negative for appetite change, chills and fever.  HENT:   Negative for hearing loss and voice change.   Eyes:  Negative for eye problems.  Respiratory:  Negative for chest tightness and cough.   Cardiovascular:  Negative for chest pain.  Gastrointestinal:  Negative for abdominal distention, abdominal pain, blood in stool and diarrhea.  Endocrine: Negative for hot flashes.  Genitourinary:  Negative for difficulty urinating and frequency.   Musculoskeletal:  Positive for back pain. Negative for arthralgias.  Skin:  Negative for itching and rash.  Neurological:  Positive for numbness (chornic finger tips). Negative for extremity weakness.  Hematological:  Negative for adenopathy.  Psychiatric/Behavioral:  Negative for confusion.    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)    Moderate mitral insufficiency    Port-A-Cath in place    Seizures [facility])    Spinal stenosis    Ulcer    Urinary incontinence     SURGICAL HISTORY: Past Surgical History:  Procedure Laterality Date   ABDOMINAL HYSTERECTOMY     BACK SURGERY     due to polio   BREAST BIOPSY Bilateral    neg   BREAST BIOPSY Right 2011   neg/stereo   CARPAL TUNNEL RELEASE      CATARACT EXTRACTION Right 2020   COLONOSCOPY WITH PROPOFOL N/A 04/04/2021   Procedure: COLONOSCOPY WITH PROPOFOL;  Surgeon: Virgel Manifold, MD;  Location: [facility] ENDOSCOPY;  Service: Endoscopy;  Laterality: N/A;   EYE SURGERY     IR IMAGING GUIDED PORT INSERTION  10/09/2021    SOCIAL HISTORY: Social History   Socioeconomic History   Marital status: Divorced    Spouse name: Not on file   Number of children: Not on file   Years of education: Not on file   Highest education level: Not on file  Occupational History   Not on file  Tobacco Use   Smoking status: Never   Smokeless tobacco: Never  Vaping Use   Vaping Use: Never used  Substance and Sexual Activity   Alcohol use: Never   Drug use: No   Sexual activity: Not on file  Other Topics Concern   Not on file  Social History Narrative   Not on file   Social Determinants of Health   Financial Resource Strain: Not on file  Food Insecurity: Not on file  Transportation Needs: Not on file  Physical Activity: Not on file  Stress: Not on file  Social Connections: Not on file  Intimate Partner Violence: Not on file    FAMILY HISTORY: Family History  Problem Relation Age of Onset   Arthritis Mother    Heart disease Mother    Stroke Mother  Hypertension Mother    COPD Mother    Sudden death Sister    Other Father        unknown medical history   Breast cancer Neg Hx     ALLERGIES:  is allergic to penicillins.  MEDICATIONS:  Current Outpatient Medications  Medication Sig Dispense Refill   acetaminophen (TYLENOL) 500 MG tablet Take 500 mg by mouth every 6 (six) hours as needed.     ALPRAZolam (XANAX) 0.5 MG tablet TAKE 1 TABLET BY MOUTH TWICE A DAY AS NEEDED FOR ANXIETY 60 tablet 0   aspirin 325 MG tablet Take 325 mg by mouth daily.     atorvastatin (LIPITOR) 10 MG tablet Take 1 tablet (10 mg total) by mouth daily. 30 tablet 2   busPIRone (BUSPAR) 5 MG tablet Take 1 tablet (5 mg total) by mouth 3 (three) times  daily. 270 tablet 1   Cyanocobalamin (VITAMIN B 12 PO) Take 500 mcg by mouth daily.     diclofenac Sodium (VOLTAREN) 1 % GEL Apply 4 g topically 4 (four) times daily. 4 g 0   FLUoxetine (PROZAC) 20 MG capsule Take 3 capsules (60 mg total) by mouth every morning. 270 capsule 1   gabapentin (NEURONTIN) 100 MG capsule TAKE 2 CAPSULES BY MOUTH 2 TIMES DAILY. 360 capsule 0   KLOR-CON M20 20 MEQ tablet TAKE 1 TABLET BY MOUTH EVERY DAY 30 tablet 1   lidocaine-prilocaine (EMLA) cream Apply small amount of cream to port site 1-2 hour prior to chemo treatment. 30 g 3   meclizine (ANTIVERT) 12.5 MG tablet Take 1 tablet (12.5 mg total) by mouth 3 (three) times daily as needed for dizziness. 30 tablet 0   methocarbamol (ROBAXIN) 500 MG tablet Take 1 tablet (500 mg total) by mouth 2 (two) times daily as needed for muscle spasms. 60 tablet 0   ondansetron (ZOFRAN) 8 MG tablet Take 1 tablet (8 mg total) by mouth 2 (two) times daily as needed (Nausea or vomiting). 30 tablet 1   polyethylene glycol powder (GLYCOLAX/MIRALAX) 17 GM/SCOOP powder Take 17 g by mouth daily. 3350 g 1   prochlorperazine (COMPAZINE) 10 MG tablet Take 1 tablet (10 mg total) by mouth every 6 (six) hours as needed (Nausea or vomiting). 30 tablet 1   triamterene-hydrochlorothiazide (MAXZIDE-25) 37.5-25 MG tablet Take 1 tablet by mouth daily. 90 tablet 1   vitamin E 180 MG (400 UNITS) capsule Take 400 Units by mouth daily.     lidocaine (LIDODERM) 5 % Place 1 patch onto the skin daily. Remove & Discard patch within 12 hours or as directed by MD (Patient not taking: Reported on 12/24/2021) 1 patch 0   loperamide (IMODIUM) 2 MG capsule Take 1 capsule (2 mg total) by mouth See admin instructions. Initial: 4 mg, followed by 2 mg after each loose stool; maximum: 16 mg/day (Patient not taking: Reported on 12/24/2021) 60 capsule 0   No current facility-administered medications for this visit.     PHYSICAL EXAMINATION: ECOG PERFORMANCE STATUS: 2 -  Symptomatic, <50% confined to bed Vitals:   12/24/21 0844  BP: 120/72  Pulse: 74  Resp: 18  Temp: (!) 96.9 F (36.1 C)   Filed Weights   12/24/21 0844  Weight: 174 lb 6.4 oz (79.1 kg)     Physical Exam Constitutional:      General: She is not in acute distress.    Comments: Patient sits in wheelchair  HENT:     Head: Normocephalic and atraumatic.  Eyes:  General: No scleral icterus. Cardiovascular:     Rate and Rhythm: Normal rate and regular rhythm.     Heart sounds: Normal heart sounds.  Pulmonary:     Effort: Pulmonary effort is normal. No respiratory distress.     Breath sounds: No wheezing.  Abdominal:     General: Bowel sounds are normal. There is no distension.     Palpations: Abdomen is soft.  Musculoskeletal:        General: No deformity. Normal range of motion.     Cervical back: Normal range of motion and neck supple.  Skin:    General: Skin is warm and dry.     Findings: No erythema or rash.  Neurological:     Mental Status: She is alert and oriented to person, place, and time. Mental status is at baseline.     Cranial Nerves: No cranial nerve deficit.     Coordination: Coordination normal.  Psychiatric:        Mood and Affect: Mood normal.    LABORATORY DATA:  I have reviewed the data as listed Lab Results  Component Value Date   WBC 3.8 (L) 12/24/2021   HGB 11.1 (L) 12/24/2021   HCT 35.6 (L) 12/24/2021   MCV 97.8 12/24/2021   PLT 234 12/24/2021   Recent Labs    11/17/21 0834 12/01/21 0902 12/24/21 0752  NA 137 139 137  K 3.5 3.4* 3.6  CL 102 103 100  CO2 26 30 30   GLUCOSE 105* 118* 91  BUN 26* 15 15  CREATININE 0.92 0.90 0.98  CALCIUM 9.0 8.9 9.3  GFRNONAA >60 >60 60*  PROT 7.3 7.0 7.2  ALBUMIN 3.7 3.3* 3.7  AST 18 25 21   ALT 14 17 15   ALKPHOS 72 75 75  BILITOT 0.2* 0.3 0.6    Iron/TIBC/Ferritin/ %Sat    Component Value Date/Time   FERRITIN 58 10/24/2020 0402       RADIOGRAPHIC STUDIES: I have personally reviewed  the radiological images as listed and agreed with the findings in the report. CT CHEST ABDOMEN PELVIS W CONTRAST  Result Date: 12/23/2021 CLINICAL DATA:  76 year old female with history of metastatic colon cancer. * Tracking Code: BO * EXAM: CT CHEST, ABDOMEN, AND PELVIS WITH CONTRAST TECHNIQUE: Multidetector CT imaging of the chest, abdomen and pelvis was performed following the standard protocol during bolus administration of intravenous contrast. RADIATION DOSE REDUCTION: This exam was performed according to the departmental dose-optimization program which includes automated exposure control, adjustment of the mA and/or kV according to patient size and/or use of iterative reconstruction technique. CONTRAST:  128m OMNIPAQUE IOHEXOL 300 MG/ML  SOLN COMPARISON:  Multiple priors, most recently chest CT 09/22/2021. CT the abdomen and pelvis 08/21/2021. FINDINGS: CT CHEST FINDINGS Cardiovascular: Heart size is normal. There is no significant pericardial fluid, thickening or pericardial calcification. Aortic atherosclerosis. No definite coronary artery calcifications. Left-sided internal jugular single-lumen porta cath with tip terminating at the superior cavoatrial junction. Mediastinum/Nodes: No pathologically enlarged mediastinal or hilar lymph nodes. Multiple densely calcified mediastinal and bilateral hilar lymph nodes are incidentally noted. Esophagus is unremarkable in appearance. No axillary lymphadenopathy. Lungs/Pleura: Calcified granulomas are noted in the lungs bilaterally. No other definite suspicious appearing pulmonary nodules or masses are noted. No acute consolidative airspace disease. No pleural effusions. Scarring in the apex of the right lung and in the periphery of the left lung base, similar to the prior study. Musculoskeletal: Kyphotic deformity of the midthoracic spine again noted with apparent congenital fusion of T6,  T7 and T8. There are no aggressive appearing lytic or blastic lesions  noted in the visualized portions of the skeleton. CT ABDOMEN PELVIS FINDINGS Hepatobiliary: Increased number and size of numerous hypovascular lesions scattered throughout the liver, indicative of progressive metastatic disease, largest of which is located between segments 2 and 3 in the left lobe of the liver (axial image 43 of series 2) measuring 3.1 x 2.3 cm on today's study (previously only 7 mm). No intra or extrahepatic biliary ductal dilatation. Gallbladder is nearly decompressed, but otherwise unremarkable in appearance. Pancreas: No pancreatic mass. No pancreatic ductal dilatation. No pancreatic or peripancreatic fluid collections or inflammatory changes. Spleen: Unremarkable. Adrenals/Urinary Tract: 3 mm nonobstructive calculus in the upper pole of the left kidney and interpolar collecting system of the right kidney. Multifocal cortical thinning in both kidneys. No suspicious renal lesions. No hydroureteronephrosis. Urinary bladder is normal in appearance. Bilateral adrenal glands are normal in appearance. Stomach/Bowel: Normal appearance of the stomach. No pathologic dilatation of small bowel or colon. A few scattered colonic diverticulae are noted, without surrounding inflammatory changes to suggest an acute diverticulitis at this time. Some regression of previously noted cecal mass (axial image 67 of series 2) which currently measures approximately 3.2 x 2.3 cm, suggesting positive response to therapy. Appendix is not visualized, likely surgically absent. Vascular/Lymphatic: Aortic atherosclerosis, without evidence of aneurysm or dissection in the abdominal or pelvic vasculature. No definite lymphadenopathy confidently identified in the abdomen or pelvis. Reproductive: Status post hysterectomy. Again noted is a large soft tissue mass in the region of the vaginal cuff which has increased substantially in size (axial image 82 of series 2 and sagittal image 139 of series 7), currently measuring 5.8 x 4.3 x  5.9 cm. This exerts local mass effect upon adjacent structures displacing the urinary bladder slightly anteriorly and the rectum posteriorly. Ovaries are unremarkable in appearance. Other: No significant volume of ascites.  No pneumoperitoneum. Musculoskeletal: There are no aggressive appearing lytic or blastic lesions noted in the visualized portions of the skeleton. IMPRESSION: 1. Today's study demonstrates a mixed response to therapy. Specifically, the previously described cecal mass appears smaller than the prior study, however, there is increased number and size of metastatic lesions throughout the liver. 2. In addition, there has been interval enlargement of a mass in the low anatomic pelvis which is intimately associated with the vaginal cuff. Whether this represents a metastatic deposit in the low anatomic pelvis, or a primary lesion of the cervix or vagina is uncertain. Regardless, this is clearly enlarged, currently measuring 5.8 x 4.3 x 5.9 cm. 3. No definitive imaging findings to suggest metastatic disease in the thorax. 4. Old granulomatous disease, as above. 5. Aortic atherosclerosis. 6. Nonobstructive calculi measuring 3 mm in both renal collecting systems. 7. Additional incidental findings, as above. Electronically Signed   By: Vinnie Langton M.D.   On: 12/23/2021 06:54      ASSESSMENT & PLAN:  1. Metastatic colon cancer to liver ([place])   2. Encounter for antineoplastic chemotherapy   3. Hypokalemia   4. Normocytic anemia   5. Vaginal mass    Cancer Staging  Metastatic colon cancer to liver [facility]) Staging form: Colon and Rectum - Neuroendocine Tumors, AJCC 8th Edition - Clinical stage from 09/16/2021: Stage IV (cTX, cNX, cM1) - Signed by [name], MD on 09/16/2021  #Metastatic colon cancer to liver, 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  No sufficient tissue for NGS Liquid biopsy showed KRAS12V, APC, TP53 mutation, TMB 4.  CEA is trending down.  S/p 6 cycles of 5-FU /Bevacizumab.  12/22/2021 CT chest  abdomen pelvis Decreased cecum mass, however increased size and size of metastatic lesions throughout the liver.  Interval enlargement of vaginal cuff mass. No mets in chest.  Labs are reviewed and discussed with patient. Hold chemo today due to low ANC.  Had a lengthy discussion with daughter. Mixed response could be secondary to heterogenicity of her metastatic colon cancer with different sensitivity to current chemo regimen.  CEA has responded very well. Another possibility is that patient may have a second primary.  Recommend additional biopsy to clarify. Discussed with gynecology oncology.  Patient will be seen by GynOnc next week for evaluation of vaginal cuff mass biopsy feasibility.  I also discussed with radiology.  If vaginal cuff mass is not able to be biopsied by gynecology oncology surgeon, will consult IR for biopsy.  Per radiology, it is difficult to tell which liver lesion correlates to the lesion that was biopsied by ultrasound.  majority of previous liver lesions have increased in size.   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  Biopsy of the vaginal cuff mass for more tissue may be able to be performed but probably require operative intervention. Liquid biopsy was performed instead. Check CA125  #Depression, psychiatry referral #Hypokalemia, improved, continue potassium chloride 98mq daily.    Supportive care measures are necessary for patient well-being and will be provided as necessary. \All questions were answered. The patient knows to call the clinic with any problems questions or concerns.  cc BJearld Fenton NP   Follow-up TBD.  [name], PhD 12/24/2021

## 2021-12-25 LAB — CA 125: Cancer Antigen (CA) 125: 6.8 U/mL (ref 0.0–38.1)

## 2021-12-25 LAB — CEA: CEA: 3.9 ng/mL (ref 0.0–4.7)

## 2021-12-26 ENCOUNTER — Inpatient Hospital Stay: Payer: Medicare HMO

## 2021-12-27 ENCOUNTER — Encounter: Payer: Self-pay | Admitting: Oncology

## 2021-12-29 ENCOUNTER — Other Ambulatory Visit: Payer: Medicare HMO

## 2021-12-29 ENCOUNTER — Ambulatory Visit: Payer: Medicare HMO | Admitting: Oncology

## 2021-12-29 ENCOUNTER — Encounter: Payer: Self-pay | Admitting: Licensed Clinical Social Worker

## 2021-12-29 ENCOUNTER — Other Ambulatory Visit: Payer: Self-pay | Admitting: Internal Medicine

## 2021-12-29 ENCOUNTER — Ambulatory Visit: Payer: Medicare HMO

## 2021-12-29 DIAGNOSIS — M4807 Spinal stenosis, lumbosacral region: Secondary | ICD-10-CM

## 2021-12-29 DIAGNOSIS — F411 Generalized anxiety disorder: Secondary | ICD-10-CM

## 2021-12-29 NOTE — Telephone Encounter (Signed)
Medication Refill - Medication:  ALPRAZolam (XANAX) 0.5 MG tablet  Has the patient contacted their pharmacy? Yes.   Contact PCP  Preferred Pharmacy (with phone number or street name):  [id number] Phone:  [phone] Fax:  [phone]  Has the patient been seen for an appointment in the last year OR does the patient have an upcoming appointment? Yes.  Agent: Please be advised that RX refills may take up to 3 business days. We ask that you follow-up with your pharmacy.

## 2021-12-29 NOTE — Progress Notes (Signed)
[place] Work  Clinical Social Work was referred by medical provider for assessment of psychosocial needs.  Clinical Social Worker contacted caregiver by phone  to offer support and assess for needs.  CSW left voicemail with contact information and request for return call.  First Attempt  Adelene Amas, [place]

## 2021-12-29 NOTE — Telephone Encounter (Signed)
Medication Refill - Medication:  gabapentin (NEURONTIN) 100 MG capsule   Has the patient contacted their pharmacy? Yes.   Contact PCP  Preferred Pharmacy (with phone number or street name):  [facility], [address]  [place], [place] [address]  Phone:  [phone]  Fax:  [phone]    Has the patient been seen for an appointment in the last year OR does the patient have an upcoming appointment? Yes.    Agent: Please be advised that RX refills may take up to 3 business days. We ask that you follow-up with your pharmacy.

## 2021-12-30 ENCOUNTER — Inpatient Hospital Stay: Payer: Medicare HMO

## 2021-12-30 NOTE — Telephone Encounter (Signed)
Please review for refill. Refill not delegated per protocol.  Last refill: 3/223 #60 Last OV: 10/28/21

## 2021-12-30 NOTE — Progress Notes (Signed)
Nutrition Follow-up:  Patient with stage IV colorectal cancer mets to liver and pelvis.  Patient receiving 5FU-leucovorin, zirabev.  Seeing GYN for vaginal cuff mass.    Spoke with patient via phone.  Patient reports that she has a good appetite.  Usually eats bagel and tea for breakfast or egg.  Lunch today was a fried fish sandwich.  Supper tonight will be cheeseburger.  Has aide that helps her and brings her food.  Also lives at [facility] and meals are included that she can go down to [place] [facility] or have them delivered to room.  Denies nausea. Takes miralax for constipation.      Medications: reviewed  Labs: reviewed  Anthropometrics:   Weight 174 lb 6.4 oz on 3/22 178 lb on 1/11   NUTRITION DIAGNOSIS: Food and nutrition related knowledge deficit improved   INTERVENTION:  Encouraged patient to continue eating good sources of protein  Continue miralax to help with bowel regimen     MONITORING, EVALUATION, GOAL: weight trends, intake   NEXT VISIT: phone call, Tuesday April 25th  Chelsea [name] B. Zenia Resides, [place], [place] Registered Dietitian [phone]

## 2021-12-31 ENCOUNTER — Inpatient Hospital Stay (HOSPITAL_BASED_OUTPATIENT_CLINIC_OR_DEPARTMENT_OTHER): Payer: Medicare HMO | Admitting: Obstetrics and Gynecology

## 2021-12-31 VITALS — BP 131/67 | HR 79 | Temp 98.7°F | Resp 19 | Wt 177.0 lb

## 2021-12-31 DIAGNOSIS — C189 Malignant neoplasm of colon, unspecified: Secondary | ICD-10-CM

## 2021-12-31 DIAGNOSIS — C787 Secondary malignant neoplasm of liver and intrahepatic bile duct: Secondary | ICD-10-CM

## 2021-12-31 DIAGNOSIS — C7960 Secondary malignant neoplasm of unspecified ovary: Secondary | ICD-10-CM

## 2021-12-31 DIAGNOSIS — C180 Malignant neoplasm of cecum: Secondary | ICD-10-CM | POA: Diagnosis not present

## 2021-12-31 DIAGNOSIS — IMO0001 Reserved for inherently not codable concepts without codable children: Secondary | ICD-10-CM | POA: Insufficient documentation

## 2021-12-31 MED ORDER — ALPRAZOLAM 0.5 MG PO TABS: ORAL_TABLET | ORAL | 0 refills | Status: DC

## 2021-12-31 MED ORDER — GABAPENTIN 100 MG PO CAPS: ORAL_CAPSULE | ORAL | 0 refills | Status: DC

## 2021-12-31 NOTE — Telephone Encounter (Signed)
Requested Prescriptions  Pending Prescriptions Disp Refills  . gabapentin (NEURONTIN) 100 MG capsule 360 capsule 0    Sig: TAKE 2 CAPSULES BY MOUTH 2 TIMES DAILY.     Neurology: Anticonvulsants - gabapentin Passed - 12/30/2021  7:57 AM      Passed - Cr in normal range and within 360 days    Creat  Date Value Ref Range Status  08/13/2020 0.87 0.60 - 0.93 mg/dL Final    Comment:    For patients >77 years of age, the reference limit for Creatinine is approximately 13% higher for people identified as African-American. .    Creatinine, Ser  Date Value Ref Range Status  12/24/2021 0.98 0.44 - 1.00 mg/dL Final         Passed - Completed PHQ-2 or PHQ-9 in the last 360 days      Passed - Valid encounter within last 12 months    Recent Outpatient Visits          2 months ago Pure hypercholesterolemia   [place], Coralie Keens, NP   3 months ago 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 [facility])   [facility], Coralie Keens, NP   5 months ago [facility] [place], Coralie Keens, NP   7 months ago Cervical myelopathy [facility])   [facility], NP   9 months ago Colonic mass   [place], NP      Future Appointments            In 3 months [place], Coralie Keens, NP [facility], [facility]

## 2021-12-31 NOTE — Telephone Encounter (Signed)
Requested medication (s) are due for refill today- yes  Requested medication (s) are on the active medication list -yes  Future visit scheduled -yes  Last refill: 11/05/21 #60  Notes to clinic: Request RF: non delegated Rx  Requested Prescriptions  Pending Prescriptions Disp Refills   methocarbamol (ROBAXIN) 500 MG tablet [Pharmacy Med Name: METHOCARBAMOL 500 MG Tablet] 60 tablet 0    Sig: TAKE 1 TABLET TWICE DAILY AS NEEDED FOR MUSCLE SPASM(S)     Not Delegated - Analgesics:  Muscle Relaxants Failed - 12/29/2021 12:57 PM      Failed - This refill cannot be delegated      Passed - Valid encounter within last 6 months    Recent Outpatient Visits           2 months ago Pure hypercholesterolemia   [place], Coralie Keens, NP   3 months ago 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 [facility])   [facility], Coralie Keens, NP   5 months ago [facility], Coralie Keens, NP   7 months ago Cervical myelopathy [facility])   [facility], Coralie Keens, NP   9 months ago Colonic mass   [place], Coralie Keens, NP       Future Appointments             In 3 months Baity, Coralie Keens, NP [facility], [facility]               Requested Prescriptions  Pending Prescriptions Disp Refills   methocarbamol (ROBAXIN) 500 MG tablet [Pharmacy Med Name: METHOCARBAMOL 500 MG Tablet] 60 tablet 0    Sig: TAKE 1 TABLET TWICE DAILY AS NEEDED FOR MUSCLE SPASM(S)     Not Delegated - Analgesics:  Muscle Relaxants Failed - 12/29/2021 12:57 PM      Failed - This refill cannot be delegated      Passed - Valid encounter within last 6 months    Recent Outpatient Visits           2 months ago Pure hypercholesterolemia   [place], Coralie Keens, NP   3 months ago 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

## 2021-12-31 NOTE — Progress Notes (Signed)
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 G3P3 female who was originally seen in consultation from Dr. Alfred Levins and Ms. Bailey for evaluation of colonic/vaginal cuff pelvic mass.    Oncology history She was see in the ER on 03/24/2021 for abdominal pain.   RUQ [place] 03/23/2021 IMPRESSION: 2.3 cm stone.  Cholelithiasis with no acute cholecystitis or choledocholithiasis.  CT abdomen 03/24/2021 Reproductive: Status post hysterectomy. There is a lobulated mass between the posterior wall of bladder and rectum which appears to arise off the vaginal cuff. This measures 3.7 x 3.4 by 3.4 cm. This is indeterminate and difficult to characterize reflecting lack of IV contrast material.   IMPRESSION: 1. Marked bladder wall thickening with surrounding soft tissue stranding is noted. Findings are concerning for cystitis. 2. There is a lobulated mass between the posterior wall of bladder and rectum which appears to arise off the vaginal cuff. This is indeterminate and difficult to characterize reflecting lack of IV contrast material. Suggest initial workup with either pelvic sonogram or CT of the pelvis with IV and oral contrast. 3. Nonobstructing left renal calculus. 4. Left sacroiliitis. 5. Lumbar spondylosis. 6. Aortic atherosclerosis.  CT Pelvic 03/24/2021 IMPRESSION: 1. Abnormal eccentric soft tissue of the right cecum, concerning for colon carcinoma. Colonoscopy recommended. 2. Low attenuation mass in the region of the vaginal cuff is again noted. Unfortunately, the intravenous contrast is not particularly helpful in this case. Pelvic ultrasound or pelvic MRI with and without contrast may provide more information. 3. Diffuse urinary bladder wall thickening with mild bilateral hydroureter, possibly cystitis.  [place]  03/24/2021 IMPRESSION: 1. Lobulated solid 3.8 cm mass confirmed by ultrasound. This is most compatible with a neoplasm but remains indeterminate as to the origin as the epicenter seems to be outside of both the vaginal cuff and the adjacent colon, while the lesion appears inseparable from both. 2. Extensive echogenic debris within the distended urinary bladder.  03/26/2021 CEA 3.4.  CA125 9.7  04/04/2021, status post colonoscopy which showed a frond like /villous nonobstructing large mass was found in the cecum, this was biopsied.  Terminal ileum was briefly intubated and appeared normal.  Diverticulosis in the sigmoid colon.  The rectum, sigmoid colon, descending colon, transverse colon and ascending colon were normal.  Nonbleeding internal hemorrhoids. 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  Given that the biopsy may not be representative of the entire underlying lesion.  Patient was recommended to establish with surgeon for evaluation. Patient no showed for her surgery appointment and as well as her follow-up appointment with gastroenterology.  Per daughter, patient was having cervical spine surgery and she prioritized that over the colon surgery.   She was referred to gyn oncology 10/22 to evaluate the pelvic mass and to GI for diagnostic colonoscopy. Denies history of abnormal pap. Hysterectomy for benign disease. She says she had a BSO and it sounds like she was menopausal after her surgery.   08/19/2021, patient establish care with surgery Dr. Christian Mate 08/21/2021, CT abdomen pelvis with contrast  showed soft tissue mass at the base of cecum measuring up to 5 cm, slightly increased in size.  Interval development of multiple low-density lesions throughout the liver highly suggestive for metastatic disease.  Complex mass in the region of the vaginal cuff measuring up to 5.2 cm, increased in size.  Right ovary also appears  more prominent on the current examination.   Moderate large volume of stool throughout the colon.  Mild urinary bladder wall thickening.  Colonic diverticulosis without evidence of active diverticulitis.  Aortic atherosclerosis   09/04/2021, patient is status post left lobe liver lesion biopsy and pathology showed metastatic adenocarcinoma, compatible with colorectal primary.   She was referred to establish care with oncology accompanied by her daughter.  Patient walks with a walker.+ Night sweats + weight loss.  Denies any abdominal pain currently, melena, blood in the stool.   She is currently being followed by home health PT/OT following a previous spinal surgery   10/10/2020, status post Mediport placement. 10/20/2020, cycle one 5-FU/bevacizumab. 6/22 Good response by CEA trend after 6 cycles. Mixed response on CT scan.  See below.            Component Ref Range & Units 7 d ago (12/24/21) 1 mo ago (12/01/21) 1 mo ago (11/17/21) 1 mo ago (11/03/21) 3 mo ago (09/15/21) 9 mo ago (03/26/21)  CEA 0.0 - 4.7 ng/mL 3.9  6.2 High  CM  11.2 High  CM  24.9 High  CM  34.2 High  CM  3.4 CM   Comment: (NOTE)                              Nonsmokers          <3.9        12/22/21 CT ABDOMEN PELVIS FINDINGS  Hepatobiliary: Increased number and size of numerous hypovascular lesions scattered throughout the liver, indicative of progressive metastatic disease, largest of which is located between segments 2 and 3 in the left lobe of the liver (axial image 43 of series 2) measuring 3.1 x 2.3 cm on today's study (previously only 7 mm). No intra or extrahepatic biliary ductal dilatation. Gallbladder is nearly decompressed, but otherwise unremarkable in appearance.   Pancreas: No pancreatic mass. No pancreatic ductal dilatation. No pancreatic or peripancreatic fluid collections or inflammatory changes.   Spleen: Unremarkable.   Adrenals/Urinary Tract: 3 mm nonobstructive calculus in the upper pole of the left kidney and interpolar collecting  system of the right kidney. Multifocal cortical thinning in both kidneys. No suspicious renal lesions. No hydroureteronephrosis. Urinary bladder is normal in appearance. Bilateral adrenal glands are normal in appearance.   Stomach/Bowel: Normal appearance of the stomach. No pathologic dilatation of small bowel or colon. A few scattered colonic diverticulae are noted, without surrounding inflammatory changes to suggest an acute diverticulitis at this time. Some regression of previously noted cecal mass (axial image 67 of series 2) which currently measures approximately 3.2 x 2.3 cm, suggesting positive response to therapy. Appendix is not visualized, likely surgically absent.   Vascular/Lymphatic: Aortic atherosclerosis, without evidence of aneurysm or dissection in the abdominal or pelvic vasculature. No definite lymphadenopathy confidently identified in the abdomen or pelvis.   Reproductive: Status post hysterectomy. Again noted is a large soft tissue mass in the region of the vaginal cuff which has increased substantially in size (axial image 82 of series 2 and sagittal image 139 of series 7), currently measuring 5.8 x 4.3 x 5.9 cm. This exerts local mass effect upon adjacent structures displacing the urinary bladder slightly anteriorly and the rectum posteriorly. Ovaries are unremarkable in appearance.   Other: No significant volume of ascites.  No pneumoperitoneum.   Musculoskeletal: There are no aggressive appearing lytic or blastic lesions noted in the visualized portions of the skeleton.  IMPRESSION: 1. Today's study demonstrates a mixed response to therapy. Specifically, the previously described cecal mass appears smaller than the prior study, however, there is increased number and size of metastatic lesions throughout the liver. 2. In addition, there has been interval enlargement of a mass in the low anatomic pelvis which is intimately associated with the  vaginal cuff. Whether this represents a metastatic deposit in the low anatomic pelvis, or a primary lesion of the cervix or vagina is uncertain. Regardless, this is clearly enlarged, currently measuring 5.8 x 4.3 x 5.9 cm. 3. No definitive imaging findings to suggest metastatic disease in the thorax. 4. Old granulomatous disease, as above. 5. Aortic atherosclerosis. 6. Nonobstructive calculi measuring 3 mm in both renal collecting systems. 7. Additional incidental findings, as above.  Foundation one liquid biopsy testing showed [id number], APC TP53 TMB 4   Problem List: Patient Active Problem List   Diagnosis Date Noted   Hypokalemia 11/17/2021   Weight loss 11/17/2021   Goals of care, counseling/discussion 09/16/2021   Metastatic colon cancer to liver ([place]) 09/16/2021   OSA (obstructive sleep apnea) 04/02/2021   Stage 3a chronic kidney disease ([place]) 03/25/2021   Aortic atherosclerosis ([place]) 03/25/2021   Class 2 obesity due to excess calories with body mass index (BMI) of 38.0 to 38.9 in adult 03/16/2021   Mixed incontinence 01/28/2021   Chronic anemia 09/26/2018   Depression, major, recurrent, in partial remission ([place]) 01/11/2018   Pure hypercholesterolemia 01/11/2018   GAD (generalized anxiety disorder) 05/14/2011   Essential hypertension 02/06/2011   Spinal stenosis of lumbosacral region 02/06/2011    Past Medical History: 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)    Moderate mitral insufficiency    Port-A-Cath in place    Seizures ([place])    Spinal stenosis    Ulcer    Urinary incontinence     Past Surgical History: Past Surgical History:  Procedure Laterality Date   ABDOMINAL HYSTERECTOMY     BACK SURGERY     due to polio   BREAST BIOPSY Bilateral    neg   BREAST BIOPSY Right 2011   neg/stereo   CARPAL TUNNEL RELEASE     CATARACT  EXTRACTION Right 2020   COLONOSCOPY WITH PROPOFOL N/A 04/04/2021   Procedure: COLONOSCOPY WITH PROPOFOL;  Surgeon: Virgel Manifold, MD;  Location: [facility] ENDOSCOPY;  Service: Endoscopy;  Laterality: N/A;   EYE SURGERY     IR IMAGING GUIDED PORT INSERTION  10/09/2021    Past Gynecologic History:  Post menopausal History of Abnormal pap: no, no abnormalities Last pap:  at birth of her last child History of STDs: The patient denies history of sexually transmitted disease. Contraception: none- post menopausal Sexually active: no  OB History:  G3P3- vaginal deliveries OB History  No obstetric history on file.    Family History: Family History  Problem Relation Age of Onset   Arthritis Mother    Heart disease Mother    Stroke Mother    Hypertension Mother    COPD Mother    Sudden death Sister    Other Father        unknown medical history   Breast cancer Neg Hx     Social History: Social History   Socioeconomic History   Marital status: Divorced    Spouse name: Not  on file   Number of children: Not on file   Years of education: Not on file   Highest education level: Not on file  Occupational History   Not on file  Tobacco Use   Smoking status: Never   Smokeless tobacco: Never  Vaping Use   Vaping Use: Never used  Substance and Sexual Activity   Alcohol use: Never   Drug use: No   Sexual activity: Not on file  Other Topics Concern   Not on file  Social History Narrative   Not on file   Social Determinants of Health   Financial Resource Strain: Not on file  Food Insecurity: Not on file  Transportation Needs: Not on file  Physical Activity: Not on file  Stress: Not on file  Social Connections: Not on file  Intimate Partner Violence: Not on file    Allergies: Allergies  Allergen Reactions   Penicillins     "Everything turned black"    Current Medications: Current Outpatient Medications  Medication Sig Dispense Refill   acetaminophen (TYLENOL) 500  MG tablet Take 500 mg by mouth every 6 (six) hours as needed.     ALPRAZolam (XANAX) 0.5 MG tablet TAKE 1 TABLET BY MOUTH TWICE A DAY AS NEEDED FOR ANXIETY 60 tablet 0   aspirin 325 MG tablet Take 325 mg by mouth daily.     atorvastatin (LIPITOR) 10 MG tablet Take 1 tablet (10 mg total) by mouth daily. 30 tablet 2   busPIRone (BUSPAR) 5 MG tablet Take 1 tablet (5 mg total) by mouth 3 (three) times daily. 270 tablet 1   Cyanocobalamin (VITAMIN B 12 PO) Take 500 mcg by mouth daily.     diclofenac Sodium (VOLTAREN) 1 % GEL Apply 4 g topically 4 (four) times daily. 4 g 0   FLUoxetine (PROZAC) 20 MG capsule Take 3 capsules (60 mg total) by mouth every morning. 270 capsule 1   gabapentin (NEURONTIN) 100 MG capsule TAKE 2 CAPSULES BY MOUTH 2 TIMES DAILY. 360 capsule 0   KLOR-CON M20 20 MEQ tablet TAKE 1 TABLET BY MOUTH EVERY DAY 30 tablet 1   lidocaine-prilocaine (EMLA) cream Apply small amount of cream to port site 1-2 hour prior to chemo treatment. 30 g 3   meclizine (ANTIVERT) 12.5 MG tablet Take 1 tablet (12.5 mg total) by mouth 3 (three) times daily as needed for dizziness. 30 tablet 0   methocarbamol (ROBAXIN) 500 MG tablet Take 1 tablet (500 mg total) by mouth 2 (two) times daily as needed for muscle spasms. 60 tablet 0   ondansetron (ZOFRAN) 8 MG tablet Take 1 tablet (8 mg total) by mouth 2 (two) times daily as needed (Nausea or vomiting). 30 tablet 1   polyethylene glycol powder (GLYCOLAX/MIRALAX) 17 GM/SCOOP powder Take 17 g by mouth daily. 3350 g 1   prochlorperazine (COMPAZINE) 10 MG tablet Take 1 tablet (10 mg total) by mouth every 6 (six) hours as needed (Nausea or vomiting). 30 tablet 1   triamterene-hydrochlorothiazide (MAXZIDE-25) 37.5-25 MG tablet Take 1 tablet by mouth daily. 90 tablet 1   vitamin E 180 MG (400 UNITS) capsule Take 400 Units by mouth daily.     lidocaine (LIDODERM) 5 % Place 1 patch onto the skin daily. Remove & Discard patch within 12 hours or as directed by MD (Patient  not taking: Reported on 12/24/2021) 1 patch 0   loperamide (IMODIUM) 2 MG capsule Take 1 capsule (2 mg total) by mouth See admin instructions. Initial: 4 mg,  followed by 2 mg after each loose stool; maximum: 16 mg/day (Patient not taking: Reported on 12/24/2021) 60 capsule 0   No current facility-administered medications for this visit.    Review of Systems General: negative for fevers, changes in weight . Positive for night sweats Skin: negative for changes in moles or sores or rash Eyes: negative for changes in vision HEENT: negative for change in hearing, tinnitus, voice changes Pulmonary: negative for dyspnea, orthopnea, productive cough, wheezing Cardiac: negative for palpitations, pain Gastrointestinal: negative for nausea, vomiting, diarrhea, hematemesis, hematochezia.   Ob/Gyn:  negative for abnormal bleeding, or pain Musculoskeletal: negative for pain, joint pain, back pain Hematology: negative for easy bruising, abnormal bleeding Neurologic/Psych: negative for headaches, seizures, paralysis, weakness, numbness   Objective:  Physical Examination:  BP 131/67   Pulse 79   Temp 98.7 F (37.1 C)   Resp 19   Wt 177 lb (80.3 kg)   SpO2 94%   BMI 38.30 kg/m    ECOG Performance Status: 3 - Symptomatic, >50% confined to bed  GENERAL: Obese female, laying on exam table.  HEENT:  eck supple with midline trachea. Thyroid without masses.  NODES:  No cervical, supraclavicular, axillary, or inguinal lymphadenopathy palpated.  LUNGS:  Clear to auscultation bilaterally.  No wheezes or rhonchi. HEART:  Regular rate and rhythm. No murmur appreciated. ABDOMEN:  Soft, nontender.   MSK:  No focal spinal tenderness to palpation.  EXTREMITIES:  1+ edema bilaterally. Right leg a bit weak. SKIN:  Clear with no obvious rashes or skin changes.  NEURO:  Nonfocal. Well oriented.  Appropriate affect.  Pelvic: Exam chaperoned by CMA EGBUS: no lesions Cervix: surgically absent Vagina: no  lesions, no discharge or bleeding - retraction of the upper vagina Uterus: surgically absent Rectovaginal: confirmatory   Lab Review Labs on site today: Lab Results  Component Value Date   WBC 3.8 (L) 12/24/2021   HGB 11.1 (L) 12/24/2021   HCT 35.6 (L) 12/24/2021   MCV 97.8 12/24/2021   PLT 234 12/24/2021     Chemistry      Component Value Date/Time   NA 137 12/24/2021 0752   K 3.6 12/24/2021 0752   CL 100 12/24/2021 0752   CO2 30 12/24/2021 0752   BUN 15 12/24/2021 0752   CREATININE 0.98 12/24/2021 0752   CREATININE 0.87 08/13/2020 1129      Component Value Date/Time   CALCIUM 9.3 12/24/2021 0752   ALKPHOS 75 12/24/2021 0752   AST 21 12/24/2021 0752   ALT 15 12/24/2021 0752   BILITOT 0.6 12/24/2021 0752           Assessment:  Chelsea [name] is a 76 y.o. 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 Initially some concern that she could have an ovarian primary, but the cecal mass was biopsied and consistent with primary colon cancer.  Started on 5FU/avastin 1/23 and excellent response of serial CEA levels, but CT scan shows mixed response with some liver lesions and cecal primary smaller, but other new liver metastases and ovarian mass has enlarged, now 5.9 cm.    Hysterectomy for benign disease.   History of UTI   Severe cervical stenosis, symptomatic s/p surgery and right sided weakness and uses walker.   Medical co-morbidities complicating care: h/o seizures, HTN, poor performance status Plan:   Problem List Items Addressed This Visit       Digestive   Metastatic colon cancer to liver [facility]) - Primary  Endocrine   Ovarian metastasis [facility])   Discussed with Dr Tasia Catchings.  The ovarian mass has enlarged a bit in the context of a mixed response in the liver.  That said, the CEA level has declined nicely from 34 to 3.9.  Discussed with Dr Tasia Catchings that I do not think a biopsy or removal of the pelvic mass  is indicated because of the possibility of a second primary.  She has a documented cecal primary and the ovary is a common site of metastasis. The response in the liver was mixed too, so not too surprising that the ovarian mass has enlarged a bit.  And the mass is not causing any symptoms.  My suggestion is to continue to treat her with the current regimen until there is clear evidence of disease progression and then switch to Folfox or another GI regimen.      We can see her back in the future should the need arise.   All questions were answered to the patient's satisfaction.  Mellody Drown, MD   CC:  Rudene Re, MD [place],  [place] [id number] [place] NP

## 2022-01-01 ENCOUNTER — Telehealth: Payer: Self-pay

## 2022-01-01 ENCOUNTER — Other Ambulatory Visit: Payer: Medicare HMO

## 2022-01-01 ENCOUNTER — Other Ambulatory Visit: Payer: Self-pay | Admitting: Oncology

## 2022-01-01 NOTE — Telephone Encounter (Signed)
-----   Message from [name], MD sent at 01/01/2022  1:26 PM EDT ----- Case was discussed on TB and I called daughter and updated her.  Please schedule lab md folfox/bevacizumab d3 pump next week. She used to be a Wednesday patient, 4/5 if possible.

## 2022-01-01 NOTE — Telephone Encounter (Signed)
Jen, please schedule patient for Lab/MD/Folfox/bevacizumab next Wednesday (4/5) DC pump day 3. Please notify of appt. Thanks

## 2022-01-01 NOTE — Progress Notes (Signed)
Tumor Board Documentation  Chelsea [name] was presented by Dr Tasia Catchings at our Tumor Board on 01/01/2022, which included representatives from medical oncology, surgical, pulmonology, genetics, pathology, research, navigation, radiation oncology, internal medicine, palliative care, radiology.  Chelsea [name] currently presents as a current patient, for [place], for new positive pathology with history of the following treatments: active survellience, immunotherapy, neoadjuvant chemotherapy.  Additionally, we reviewed previous medical and familial history, history of present illness, and recent lab results along with all available histopathologic and imaging studies. The tumor board considered available treatment options and made the following recommendations: Biopsy (CT Guided biopsy of Vaginal Cuff Mass) Refer to Palliative Care and GYN  The following procedures/referrals were also placed: No orders of the defined types were placed in this encounter.   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  Tumor board is a meeting of clinicians from various specialty areas who evaluate and discuss patients for whom a multidisciplinary approach is being considered. Final determinations in the plan of care are those of the provider(s). The responsibility for follow up of recommendations given during tumor board is that of the provider.   Today's extended care, comprehensive team conference, Chelsea [name] was not present for the discussion and was not examined.   Multidisciplinary Tumor Board is a multidisciplinary case peer review process.  Decisions discussed in the Multidisciplinary Tumor Board reflect the opinions of the specialists present at the conference without having examined the patient.  Ultimately, treatment  and diagnostic decisions rest with the primary provider(s) and the patient.

## 2022-01-06 ENCOUNTER — Encounter: Payer: Self-pay | Admitting: Oncology

## 2022-01-07 ENCOUNTER — Inpatient Hospital Stay: Payer: Medicare HMO

## 2022-01-07 ENCOUNTER — Other Ambulatory Visit: Payer: Self-pay | Admitting: Internal Medicine

## 2022-01-07 ENCOUNTER — Encounter: Payer: Self-pay | Admitting: Oncology

## 2022-01-07 ENCOUNTER — Inpatient Hospital Stay: Payer: Medicare HMO | Attending: Oncology

## 2022-01-07 ENCOUNTER — Inpatient Hospital Stay (HOSPITAL_BASED_OUTPATIENT_CLINIC_OR_DEPARTMENT_OTHER): Payer: Medicare HMO | Admitting: Oncology

## 2022-01-07 VITALS — BP 115/72 | HR 74 | Temp 96.9°F | Wt 181.0 lb

## 2022-01-07 DIAGNOSIS — C180 Malignant neoplasm of cecum: Secondary | ICD-10-CM | POA: Diagnosis not present

## 2022-01-07 DIAGNOSIS — C787 Secondary malignant neoplasm of liver and intrahepatic bile duct: Secondary | ICD-10-CM | POA: Diagnosis not present

## 2022-01-07 DIAGNOSIS — C189 Malignant neoplasm of colon, unspecified: Secondary | ICD-10-CM

## 2022-01-07 DIAGNOSIS — D649 Anemia, unspecified: Secondary | ICD-10-CM

## 2022-01-07 DIAGNOSIS — Z5111 Encounter for antineoplastic chemotherapy: Secondary | ICD-10-CM | POA: Diagnosis present

## 2022-01-07 DIAGNOSIS — E876 Hypokalemia: Secondary | ICD-10-CM | POA: Diagnosis not present

## 2022-01-07 DIAGNOSIS — Z5112 Encounter for antineoplastic immunotherapy: Secondary | ICD-10-CM | POA: Diagnosis present

## 2022-01-07 DIAGNOSIS — N898 Other specified noninflammatory disorders of vagina: Secondary | ICD-10-CM

## 2022-01-07 DIAGNOSIS — Z79899 Other long term (current) drug therapy: Secondary | ICD-10-CM | POA: Insufficient documentation

## 2022-01-07 DIAGNOSIS — G629 Polyneuropathy, unspecified: Secondary | ICD-10-CM

## 2022-01-07 DIAGNOSIS — D701 Agranulocytosis secondary to cancer chemotherapy: Secondary | ICD-10-CM | POA: Insufficient documentation

## 2022-01-07 DIAGNOSIS — M4807 Spinal stenosis, lumbosacral region: Secondary | ICD-10-CM

## 2022-01-07 LAB — COMPREHENSIVE METABOLIC PANEL
ALT: 16 U/L (ref 0–44)
AST: 20 U/L (ref 15–41)
Albumin: 3.6 g/dL (ref 3.5–5.0)
Alkaline Phosphatase: 64 U/L (ref 38–126)
Anion gap: 4 — ABNORMAL LOW (ref 5–15)
BUN: 18 mg/dL (ref 8–23)
CO2: 28 mmol/L (ref 22–32)
Calcium: 8.8 mg/dL — ABNORMAL LOW (ref 8.9–10.3)
Chloride: 104 mmol/L (ref 98–111)
Creatinine, Ser: 0.97 mg/dL (ref 0.44–1.00)
GFR, Estimated: >60 mL/min (ref >60)
Glucose, Bld: 127 mg/dL — ABNORMAL HIGH (ref 70–99)
Potassium: 3.4 mmol/L — ABNORMAL LOW (ref 3.5–5.1)
Sodium: 136 mmol/L (ref 135–145)
Total Bilirubin: 0.5 mg/dL (ref 0.3–1.2)
Total Protein: 7.1 g/dL (ref 6.5–8.1)

## 2022-01-07 LAB — CBC WITH DIFFERENTIAL/PLATELET
Abs Immature Granulocytes: 0.01 10*3/uL (ref 0.00–0.07)
Basophils Absolute: 0 10*3/uL (ref 0.0–0.1)
Basophils Relative: 1 %
Eosinophils Absolute: 0.2 10*3/uL (ref 0.0–0.5)
Eosinophils Relative: 4 %
HCT: 33.9 % — ABNORMAL LOW (ref 36.0–46.0)
Hemoglobin: 10.7 g/dL — ABNORMAL LOW (ref 12.0–15.0)
Immature Granulocytes: 0 %
Lymphocytes Relative: 41 %
Lymphs Abs: 1.9 10*3/uL (ref 0.7–4.0)
MCH: 31.2 pg (ref 26.0–34.0)
MCHC: 31.6 g/dL (ref 30.0–36.0)
MCV: 98.8 fL (ref 80.0–100.0)
Monocytes Absolute: 0.5 10*3/uL (ref 0.1–1.0)
Monocytes Relative: 10 %
Neutro Abs: 2 10*3/uL (ref 1.7–7.7)
Neutrophils Relative %: 44 %
Platelets: 163 10*3/uL (ref 150–400)
RBC: 3.43 MIL/uL — ABNORMAL LOW (ref 3.87–5.11)
RDW: 19.3 % — ABNORMAL HIGH (ref 11.5–15.5)
WBC: 4.6 10*3/uL (ref 4.0–10.5)
nRBC: 0 % (ref 0.0–0.2)

## 2022-01-07 LAB — PROTEIN, URINE, RANDOM: Total Protein, Urine: 10 mg/dL

## 2022-01-07 MED ORDER — PROCHLORPERAZINE MALEATE 10 MG PO TABS
10.0000 mg | ORAL_TABLET | Freq: Once | ORAL | Status: AC
  Administered 2022-01-07: 10 mg via ORAL

## 2022-01-07 MED ORDER — SODIUM CHLORIDE 0.9 % IV SOLN
2400.0000 mg/m2 | INTRAVENOUS | Status: DC
  Administered 2022-01-07: 4300 mg via INTRAVENOUS
  Filled 2022-01-07: qty 86

## 2022-01-07 MED ORDER — SODIUM CHLORIDE 0.9 % IV SOLN
700.0000 mg | Freq: Once | INTRAVENOUS | Status: AC
  Administered 2022-01-07: 700 mg via INTRAVENOUS
  Filled 2022-01-07: qty 35

## 2022-01-07 MED ORDER — SODIUM CHLORIDE 0.9 % IV SOLN
Freq: Once | INTRAVENOUS | Status: AC
  Filled 2022-01-07: qty 250

## 2022-01-07 MED ORDER — SODIUM CHLORIDE 0.9 % IV SOLN
5.0000 mg/kg | Freq: Once | INTRAVENOUS | Status: AC
  Administered 2022-01-07: 400 mg via INTRAVENOUS
  Filled 2022-01-07: qty 16

## 2022-01-07 MED ORDER — FLUOROURACIL CHEMO INJECTION 2.5 GM/50ML
400.0000 mg/m2 | Freq: Once | INTRAVENOUS | Status: AC
  Administered 2022-01-07: 700 mg via INTRAVENOUS
  Filled 2022-01-07: qty 14

## 2022-01-07 NOTE — Telephone Encounter (Signed)
Medication Refill - Medication: gabapentin (NEURONTIN) 100 MG capsule  Pt is asking for a courtesy refill to be sent in to the pharmacy because the mail-in delivery service won't send her the medication until April 11th. Pt mentioned her cancer Dr. Alinda Dooms she stop the mail delivery because she is running out of medication.    Pt has been without medication since yesterday.  Has the patient contacted their pharmacy? Yes.   Wont be shipping until April 11th.   (Agent: If yes, when and what did the pharmacy advise?)  Preferred Pharmacy (with phone number or street name):  [id number] [id number] Phone: [phone]: [phone] Hours: Not open 24 hours   Has the patient been seen for an appointment in the last year OR does the patient have an upcoming appointment? Yes.    Agent: Please be advised that RX refills may take up to 3 business days. We ask that you follow-up with your pharmacy.

## 2022-01-07 NOTE — Progress Notes (Signed)
Patient here for follow up. Patient complains of being very fatigue.

## 2022-01-07 NOTE — Patient Instructions (Signed)
[facility]  Discharge Instructions: ?Thank you for choosing [facility] to provide your oncology and hematology care.  ?If you have a lab appointment with the Cancer Center, please go directly to the Cancer Center and check in at the registration area. ? ?Wear comfortable clothing and clothing appropriate for easy access to any Portacath or PICC line.  ? ?We strive to give you quality time with your provider. You may need to reschedule your appointment if you arrive late (15 or more minutes).  Arriving late affects you and other patients whose appointments are after yours.  Also, if you miss three or more appointments without notifying the office, you may be dismissed from the clinic at the provider?s discretion.    ?  ?For prescription refill requests, have your pharmacy contact our office and allow 72 hours for refills to be completed.   ? ?  ?To help prevent nausea and vomiting after your treatment, we encourage you to take your nausea medication as directed. ? ?BELOW ARE SYMPTOMS THAT SHOULD BE REPORTED IMMEDIATELY: ?*FEVER GREATER THAN 100.4 F (38 ?C) OR HIGHER ?*CHILLS OR SWEATING ?*NAUSEA AND VOMITING THAT IS NOT CONTROLLED WITH YOUR NAUSEA MEDICATION ?*UNUSUAL SHORTNESS OF BREATH ?*UNUSUAL BRUISING OR BLEEDING ?*URINARY PROBLEMS (pain or burning when urinating, or frequent urination) ?*BOWEL PROBLEMS (unusual diarrhea, constipation, pain near the anus) ?TENDERNESS IN MOUTH AND THROAT WITH OR WITHOUT PRESENCE OF ULCERS (sore throat, sores in mouth, or a toothache) ?UNUSUAL RASH, SWELLING OR PAIN  ?UNUSUAL VAGINAL DISCHARGE OR ITCHING  ? ?Items with * indicate a potential emergency and should be followed up as soon as possible or go to the Emergency Department if any problems should occur. ? ?Please show the CHEMOTHERAPY ALERT CARD or IMMUNOTHERAPY ALERT CARD at check-in to the Emergency Department and triage nurse. ? ?Should you have questions after your visit  or need to cancel or reschedule your appointment, please contact [facility]  [phone] and follow the prompts.  Office hours are 8:00 a.m. to 4:30 p.m. Monday - Friday. Please note that voicemails left after 4:00 p.m. may not be returned until the following business day.  We are closed weekends and major holidays. You have access to a nurse at all times for urgent questions. Please call the main number to the clinic [phone] and follow the prompts. ? ?For any non-urgent questions, you may also contact your provider using MyChart. We now offer e-Visits for anyone 18 and older to request care online for non-urgent symptoms. For details visit [email or web address]. ?  ?Also download the MyChart app! Go to the app store, search "MyChart", open the app, select [facility], and log in with your MyChart username and password. ? ?Due to Covid, a mask is required upon entering the hospital/clinic. If you do not have a mask, one will be given to you upon arrival. For doctor visits, patients may have 1 support person aged 18 or older with them. For treatment visits, patients cannot have anyone with them due to current Covid guidelines and our immunocompromised population.  ?

## 2022-01-07 NOTE — Progress Notes (Signed)
Hematology/Oncology Progress note [phone] [phone]         Patient Care Team: Jearld Fenton, NP as PCP - General (Internal Medicine) Clent Jacks, RN as Oncology Nurse Navigator [name], MD as Consulting Physician (Oncology)  REFERRING PROVIDER: Jearld Fenton, NP  CHIEF COMPLAINTS/REASON FOR VISIT:  metastatic colon cancer  HISTORY OF PRESENTING ILLNESS:   Chelsea [name] is a  76 y.o.  female with PMH listed below was seen in consultation at the request of  Jearld Fenton, NP  for evaluation of metastatic colon cancer.  Patient initially went to emergency room on 03/24/2021 for abdominal pain. 03/24/2021, CT scan of the abdomen showed marked bladder wall thickening with surrounding soft tissue stranding is noted.  Concerning for cystitis.  There is a lobulated mass between the posterior wall of the bladder and the rectum which appears to arise from the vaginal cuff.  This is indeterminate and difficult to characterize reflecting lack of IV contrast material.  Nonobstructing left renal calculus, left sacroiliitis, lumbar spondylosis aortic atherosclerosis. 03/24/2021 CT pelvic showed abdominal [place] soft tissue of right cecum, concerning for colon carcinoma.  Colonoscopy recommended.  Low-attenuation mass in the region of vaginal cuff is again noted.  Diffuse urinary bladder wall thickening and mild bilateral hydroureter possibly cystitis.  [place] 03/24/2021 Lobulated solid 3.8 cm mass confirmed by ultrasound. This is most compatible with a neoplasm but remains indeterminate as to the origin as the epicenter seems to be outside of both the vaginal cuff and the adjacent colon, while the lesion appears inseparable from both. Extensive echogenic debris within the distended urinary bladder.   03/26/2021, patient was seen by Dr. Theora Gianotti for evaluation of colonic/vaginal cuff pelvic mass.  Patient also was referred to gastroenterology for  colonoscopy.  03/26/2021 CEA 3.4.  CA125 9.7 04/04/2021, status post colonoscopy which showed a frond like /villous nonobstructing large mass was found in the cecum, this was biopsied.  Terminal ileum was briefly intubated and appeared normal.  Diverticulosis in the sigmoid colon.  The rectum, sigmoid colon, descending colon, transverse colon and ascending colon were normal.  Nonbleeding internal hemorrhoids. 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    Given that the biopsy may not be representative of the entire underlying lesion.  Patient was recommended to establish with surgeon for evaluation. Patient no showed for her surgery appointment and as well as her follow-up appointment with gastroenterology.  Per daughter, patient was having cervical spine surgery and she prioritized that over the colon surgery.  08/19/2021, patient establish care with surgery Dr. Christian Mate 08/21/2021, CT abdomen pelvis with contrast  showed soft tissue mass at the base of cecum measuring up to 5 cm, slightly increased in size.  Interval development of multiple low-density lesions throughout the liver highly suggestive for metastatic disease.  Complex mass in the region of the vaginal cuff measuring up to 5.2 cm, increased in size.  Right ovary also appears more prominent on the current examination.  Moderate large volume of stool throughout the colon.  Mild urinary bladder wall thickening.  Colonic diverticulosis without evidence of active diverticulitis.  Aortic atherosclerosis  09/04/2021, patient is status post left lobe liver lesion biopsy and pathology showed metastatic adenocarcinoma, compatible with colorectal primary.  She was referred to establish care with oncology. She was accompanied by her daughter today.  Patient walks with a walker.+ Night sweats + weight loss.  Denies any abdominal pain currently, melena, blood in the stool.  She is  currently being followed by home health  PT/OT following a previous spinal surgery  10/10/2020, status post Mediport placement. 10/20/2020, cycle one 5-FU/bevacizumab.  Foundation one liquid biopsy testing showed KRASG12V, APC TP53 TMB 4  12/22/2021 CT chest abdomen pelvis Decreased cecum mass, however increased size and size of metastatic lesions throughout the liver.  Interval enlargement of vaginal cuff mass. No mets in chest.   INTERVAL HISTORY Chelsea [name] is a 76 y.o. female who has above history reviewed by me today presents for follow up visit for management of metastatic colon cancer [name]. Patient reports fatigue. Denies nausea vomiting diarrhea, abdominal pain. During the interval, patient was seen by gynecology oncology Dr. Fransisca Connors.  Biopsy of the vaginal mass was not recommended. Patient's case was also discussed on multidisciplinary tumor board.  IR potentially can try a biopsy if patient is willing to. I had a discussion with patient's daughter over the phone prior to this visit.  We discussed about option of adding oxaliplatin to 5-FU/bevacizumab and continue treatment of colon cancer, repeat CT scan short-term and if progression, repeat biopsy versus proceeding with vaginal mass biopsy.  Daughter prefers proceeding with chemotherapy and defer biopsy for now.  Patient tolerates chemotherapy. Denies nausea vomiting, diarrhea, abdominal pain. She feels good. No fever or chills.  Patient has pre-existing neuropathy of right hand numbness tingling and has been recommended recently by primary care provider to start gabapentin.  Prescription was sent to mail delivery and she has not started yet.  Review of Systems  Constitutional:  Positive for fatigue and unexpected weight change. Negative for appetite change, chills and fever.  HENT:   Negative for hearing loss and voice change.   Eyes:  Negative for eye problems.  Respiratory:  Negative for chest tightness and cough.    Cardiovascular:  Negative for chest pain.  Gastrointestinal:  Negative for abdominal distention, abdominal pain, blood in stool and diarrhea.  Endocrine: Negative for hot flashes.  Genitourinary:  Negative for difficulty urinating and frequency.   Musculoskeletal:  Positive for back pain. Negative for arthralgias.  Skin:  Negative for itching and rash.  Neurological:  Positive for numbness (chornic finger tips). Negative for extremity weakness.  Hematological:  Negative for adenopathy.  Psychiatric/Behavioral:  Negative for confusion.    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)    Moderate mitral insufficiency    Port-A-Cath in place    Seizures [facility])    Spinal stenosis    Ulcer    Urinary incontinence     SURGICAL HISTORY: Past Surgical History:  Procedure Laterality Date   ABDOMINAL HYSTERECTOMY     BACK SURGERY     due to polio   BREAST BIOPSY Bilateral    neg   BREAST BIOPSY Right 2011   neg/stereo   CARPAL TUNNEL RELEASE     CATARACT EXTRACTION Right 2020   COLONOSCOPY WITH PROPOFOL N/A 04/04/2021   Procedure: COLONOSCOPY WITH PROPOFOL;  Surgeon: Virgel Manifold, MD;  Location: [facility] ENDOSCOPY;  Service: Endoscopy;  Laterality: N/A;   EYE SURGERY     IR IMAGING GUIDED PORT INSERTION  10/09/2021    SOCIAL HISTORY: Social History   Socioeconomic History   Marital status: Divorced    Spouse name: Not on file  Number of children: Not on file   Years of education: Not on file   Highest education level: Not on file  Occupational History   Not on file  Tobacco Use   Smoking status: Never   Smokeless tobacco: Never  Vaping Use   Vaping Use: Never used  Substance and Sexual Activity   Alcohol use: Never   Drug use: No   Sexual activity: Not on file  Other Topics Concern   Not on file  Social History Narrative    Not on file   Social Determinants of Health   Financial Resource Strain: Not on file  Food Insecurity: Not on file  Transportation Needs: Not on file  Physical Activity: Not on file  Stress: Not on file  Social Connections: Not on file  Intimate Partner Violence: Not on file    FAMILY HISTORY: Family History  Problem Relation Age of Onset   Arthritis Mother    Heart disease Mother    Stroke Mother    Hypertension Mother    COPD Mother    Sudden death Sister    Other Father        unknown medical history   Breast cancer Neg Hx     ALLERGIES:  is allergic to penicillins.  MEDICATIONS:  Current Outpatient Medications  Medication Sig Dispense Refill   acetaminophen (TYLENOL) 500 MG tablet Take 500 mg by mouth every 6 (six) hours as needed.     ALPRAZolam (XANAX) 0.5 MG tablet TAKE 1 TABLET BY MOUTH TWICE A DAY AS NEEDED FOR ANXIETY 60 tablet 0   aspirin 325 MG tablet Take 325 mg by mouth daily.     atorvastatin (LIPITOR) 10 MG tablet Take 1 tablet (10 mg total) by mouth daily. 30 tablet 2   busPIRone (BUSPAR) 5 MG tablet Take 1 tablet (5 mg total) by mouth 3 (three) times daily. 270 tablet 1   Cyanocobalamin (VITAMIN B 12 PO) Take 500 mcg by mouth daily.     diclofenac Sodium (VOLTAREN) 1 % GEL Apply 4 g topically 4 (four) times daily. 4 g 0   FLUoxetine (PROZAC) 20 MG capsule Take 3 capsules (60 mg total) by mouth every morning. 270 capsule 1   gabapentin (NEURONTIN) 100 MG capsule TAKE 2 CAPSULES BY MOUTH 2 TIMES DAILY. 360 capsule 0   KLOR-CON M20 20 MEQ tablet TAKE 1 TABLET BY MOUTH EVERY DAY 30 tablet 1   lidocaine-prilocaine (EMLA) cream Apply small amount of cream to port site 1-2 hour prior to chemo treatment. 30 g 3   meclizine (ANTIVERT) 12.5 MG tablet Take 1 tablet (12.5 mg total) by mouth 3 (three) times daily as needed for dizziness. 30 tablet 0   methocarbamol (ROBAXIN) 500 MG tablet TAKE 1 TABLET TWICE DAILY AS NEEDED FOR MUSCLE SPASM(S) 60 tablet 0    ondansetron (ZOFRAN) 8 MG tablet Take 1 tablet (8 mg total) by mouth 2 (two) times daily as needed (Nausea or vomiting). 30 tablet 1   polyethylene glycol powder (GLYCOLAX/MIRALAX) 17 GM/SCOOP powder Take 17 g by mouth daily. 3350 g 1   prochlorperazine (COMPAZINE) 10 MG tablet Take 1 tablet (10 mg total) by mouth every 6 (six) hours as needed (Nausea or vomiting). 30 tablet 1   triamterene-hydrochlorothiazide (MAXZIDE-25) 37.5-25 MG tablet Take 1 tablet by mouth daily. 90 tablet 1   vitamin E 180 MG (400 UNITS) capsule Take 400 Units by mouth daily.     lidocaine (LIDODERM) 5 % Place 1 patch onto the  skin daily. Remove & Discard patch within 12 hours or as directed by MD (Patient not taking: Reported on 12/24/2021) 1 patch 0   loperamide (IMODIUM) 2 MG capsule Take 1 capsule (2 mg total) by mouth See admin instructions. Initial: 4 mg, followed by 2 mg after each loose stool; maximum: 16 mg/day (Patient not taking: Reported on 12/24/2021) 60 capsule 0   No current facility-administered medications for this visit.   Facility-Administered Medications Ordered in Other Visits  Medication Dose Route Frequency Provider Last Rate Last Admin   fluorouracil (ADRUCIL) 4,300 mg in sodium chloride 0.9 % 64 mL chemo infusion  2,400 mg/m2 (Order-Specific) Intravenous 1 day or 1 dose [name], MD   4,300 mg at 01/07/22 1230     PHYSICAL EXAMINATION: ECOG PERFORMANCE STATUS: 2 - Symptomatic, <50% confined to bed Vitals:   01/07/22 0833  BP: 115/72  Pulse: 74  Temp: (!) 96.9 F (36.1 C)   Filed Weights   01/07/22 0833  Weight: 181 lb (82.1 kg)     Physical Exam Constitutional:      General: She is not in acute distress.    Comments: Patient sits in wheelchair  HENT:     Head: Normocephalic and atraumatic.  Eyes:     General: No scleral icterus. Cardiovascular:     Rate and Rhythm: Normal rate and regular rhythm.     Heart sounds: Normal heart sounds.  Pulmonary:     Effort: Pulmonary effort  is normal. No respiratory distress.     Breath sounds: No wheezing.  Abdominal:     General: Bowel sounds are normal. There is no distension.     Palpations: Abdomen is soft.  Musculoskeletal:        General: No deformity. Normal range of motion.     Cervical back: Normal range of motion and neck supple.  Skin:    General: Skin is warm and dry.     Findings: No erythema or rash.  Neurological:     Mental Status: She is alert and oriented to person, place, and time. Mental status is at baseline.     Cranial Nerves: No cranial nerve deficit.     Coordination: Coordination normal.  Psychiatric:        Mood and Affect: Mood normal.    LABORATORY DATA:  I have reviewed the data as listed Lab Results  Component Value Date   WBC 4.6 01/07/2022   HGB 10.7 (L) 01/07/2022   HCT 33.9 (L) 01/07/2022   MCV 98.8 01/07/2022   PLT 163 01/07/2022   Recent Labs    12/01/21 0902 12/24/21 0752 01/07/22 0809  NA 139 137 136  K 3.4* 3.6 3.4*  CL 103 100 104  CO2 30 30 28   GLUCOSE 118* 91 127*  BUN 15 15 18   CREATININE 0.90 0.98 0.97  CALCIUM 8.9 9.3 8.8*  GFRNONAA >60 60* >60  PROT 7.0 7.2 7.1  ALBUMIN 3.3* 3.7 3.6  AST 25 21 20   ALT 17 15 16   ALKPHOS 75 75 64  BILITOT 0.3 0.6 0.5    Iron/TIBC/Ferritin/ %Sat    Component Value Date/Time   FERRITIN 58 10/24/2020 0402       RADIOGRAPHIC STUDIES: I have personally reviewed the radiological images as listed and agreed with the findings in the report. CT CHEST ABDOMEN PELVIS W CONTRAST  Result Date: 12/23/2021 CLINICAL DATA:  76 year old female with history of metastatic colon cancer. * Tracking Code: BO * EXAM: CT CHEST, ABDOMEN, AND PELVIS  WITH CONTRAST TECHNIQUE: Multidetector CT imaging of the chest, abdomen and pelvis was performed following the standard protocol during bolus administration of intravenous contrast. RADIATION DOSE REDUCTION: This exam was performed according to the departmental dose-optimization program which  includes automated exposure control, adjustment of the mA and/or kV according to patient size and/or use of iterative reconstruction technique. CONTRAST:  157m OMNIPAQUE IOHEXOL 300 MG/ML  SOLN COMPARISON:  Multiple priors, most recently chest CT 09/22/2021. CT the abdomen and pelvis 08/21/2021. FINDINGS: CT CHEST FINDINGS Cardiovascular: Heart size is normal. There is no significant pericardial fluid, thickening or pericardial calcification. Aortic atherosclerosis. No definite coronary artery calcifications. Left-sided internal jugular single-lumen porta cath with tip terminating at the superior cavoatrial junction. Mediastinum/Nodes: No pathologically enlarged mediastinal or hilar lymph nodes. Multiple densely calcified mediastinal and bilateral hilar lymph nodes are incidentally noted. Esophagus is unremarkable in appearance. No axillary lymphadenopathy. Lungs/Pleura: Calcified granulomas are noted in the lungs bilaterally. No other definite suspicious appearing pulmonary nodules or masses are noted. No acute consolidative airspace disease. No pleural effusions. Scarring in the apex of the right lung and in the periphery of the left lung base, similar to the prior study. Musculoskeletal: Kyphotic deformity of the midthoracic spine again noted with apparent congenital fusion of T6, T7 and T8. There are no aggressive appearing lytic or blastic lesions noted in the visualized portions of the skeleton. CT ABDOMEN PELVIS FINDINGS Hepatobiliary: Increased number and size of numerous hypovascular lesions scattered throughout the liver, indicative of progressive metastatic disease, largest of which is located between segments 2 and 3 in the left lobe of the liver (axial image 43 of series 2) measuring 3.1 x 2.3 cm on today's study (previously only 7 mm). No intra or extrahepatic biliary ductal dilatation. Gallbladder is nearly decompressed, but otherwise unremarkable in appearance. Pancreas: No pancreatic mass. No  pancreatic ductal dilatation. No pancreatic or peripancreatic fluid collections or inflammatory changes. Spleen: Unremarkable. Adrenals/Urinary Tract: 3 mm nonobstructive calculus in the upper pole of the left kidney and interpolar collecting system of the right kidney. Multifocal cortical thinning in both kidneys. No suspicious renal lesions. No hydroureteronephrosis. Urinary bladder is normal in appearance. Bilateral adrenal glands are normal in appearance. Stomach/Bowel: Normal appearance of the stomach. No pathologic dilatation of small bowel or colon. A few scattered colonic diverticulae are noted, without surrounding inflammatory changes to suggest an acute diverticulitis at this time. Some regression of previously noted cecal mass (axial image 67 of series 2) which currently measures approximately 3.2 x 2.3 cm, suggesting positive response to therapy. Appendix is not visualized, likely surgically absent. Vascular/Lymphatic: Aortic atherosclerosis, without evidence of aneurysm or dissection in the abdominal or pelvic vasculature. No definite lymphadenopathy confidently identified in the abdomen or pelvis. Reproductive: Status post hysterectomy. Again noted is a large soft tissue mass in the region of the vaginal cuff which has increased substantially in size (axial image 82 of series 2 and sagittal image 139 of series 7), currently measuring 5.8 x 4.3 x 5.9 cm. This exerts local mass effect upon adjacent structures displacing the urinary bladder slightly anteriorly and the rectum posteriorly. Ovaries are unremarkable in appearance. Other: No significant volume of ascites.  No pneumoperitoneum. Musculoskeletal: There are no aggressive appearing lytic or blastic lesions noted in the visualized portions of the skeleton. IMPRESSION: 1. Today's study demonstrates a mixed response to therapy. Specifically, the previously described cecal mass appears smaller than the prior study, however, there is increased number  and size of metastatic lesions throughout the liver.  2. In addition, there has been interval enlargement of a mass in the low anatomic pelvis which is intimately associated with the vaginal cuff. Whether this represents a metastatic deposit in the low anatomic pelvis, or a primary lesion of the cervix or vagina is uncertain. Regardless, this is clearly enlarged, currently measuring 5.8 x 4.3 x 5.9 cm. 3. No definitive imaging findings to suggest metastatic disease in the thorax. 4. Old granulomatous disease, as above. 5. Aortic atherosclerosis. 6. Nonobstructive calculi measuring 3 mm in both renal collecting systems. 7. Additional incidental findings, as above. Electronically Signed   By: Vinnie Langton M.D.   On: 12/23/2021 06:54      ASSESSMENT & PLAN:  1. Metastatic colon cancer to liver ([place])   2. Encounter for antineoplastic chemotherapy   3. Vaginal mass   4. Hypokalemia   5. Normocytic anemia   6. Neuropathy    Cancer Staging  Metastatic colon cancer to liver [facility]) Staging form: Colon and Rectum - Neuroendocine Tumors, AJCC 8th Edition - Clinical stage from 09/16/2021: Stage IV (cTX, cNX, cM1) - Signed by [name], MD on 09/16/2021  #Metastatic colon cancer to liver, 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  No sufficient tissue for NGS Liquid biopsy showed KRAS12V, APC, TP53 mutation, TMB 4.  CEA is trending down. S/p 6 cycles of 5-FU /Bevacizumab.  Recent CT images were reviewed and discussed with patient and her son-in-law.  CEA has responded well. Shared decision was made to proceed with chemotherapy with addition of oxaliplatin to 5-FU/bevacizumab regimen.  Rationale and potential side effects were reviewed and discussed with patient.  She agrees with the plan With her pre-existing neuropathy, performance status, I recommend to start with dose reduced oxaliplatin 34m/m2 Recommend patient to start Neurontin once she receives her supply.  #Vaginal cuff complex mass, possible metastasis from colon  cancer versus a second primary.  She will decision was made to defer biopsy currently, proceed with treatment for metastatic colon cancer and repeat short-term CT for follow-up.  #Hypokalemia,continue potassium chloride 280m daily.    Supportive care measures are necessary for patient well-being and will be provided as necessary. \All questions were answered. The patient knows to call the clinic with any problems questions or concerns.  cc BaJearld FentonNP   Follow-up lab MD 2 weeks FOLFOX/bevacizumab.  ZhEarlie ServerMD, PhD 01/07/2022

## 2022-01-08 LAB — CEA: CEA: 2.8 ng/mL (ref 0.0–4.7)

## 2022-01-08 MED ORDER — GABAPENTIN 100 MG PO CAPS: ORAL_CAPSULE | ORAL | 0 refills | Status: DC

## 2022-01-08 NOTE — Addendum Note (Signed)
Addended by: Corky Sox E on: 01/08/2022 02:11 PM   Modules accepted: Orders

## 2022-01-08 NOTE — Telephone Encounter (Signed)
Called pt and LM on VM to call back to verify which pharmacy she wants to use for courtesy short term refill and where she wants her meds filled. Note indicated her cancer doctor wants her meds to be filled local.  [place] mail order pharmacy and spoke to [facility]. Cancelled the prescription there.  Awaiting her  call back.

## 2022-01-08 NOTE — Telephone Encounter (Signed)
Requested Prescriptions  Pending Prescriptions Disp Refills  . gabapentin (NEURONTIN) 100 MG capsule 24 capsule 0    Sig: TAKE 2 CAPSULES BY MOUTH 2 TIMES DAILY.     Neurology: Anticonvulsants - gabapentin Passed - 01/07/2022  3:11 PM      Passed - Cr in normal range and within 360 days    Creat  Date Value Ref Range Status  08/13/2020 0.87 0.60 - 0.93 mg/dL Final    Comment:    For patients >77 years of age, the reference limit for Creatinine is approximately 13% higher for people identified as African-American. .    Creatinine, Ser  Date Value Ref Range Status  01/07/2022 0.97 0.44 - 1.00 mg/dL Final         Passed - Completed PHQ-2 or PHQ-9 in the last 360 days      Passed - Valid encounter within last 12 months    Recent Outpatient Visits          2 months ago Pure hypercholesterolemia   [place], Coralie Keens, NP   3 months ago 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 [facility])   [facility], Coralie Keens, NP   5 months ago [facility] [place], Coralie Keens, NP   7 months ago Cervical myelopathy [facility])   [facility], NP   9 months ago Colonic mass   [place], NP      Future Appointments            In 3 months [place], Coralie Keens, NP [facility], [facility]

## 2022-01-08 NOTE — Telephone Encounter (Signed)
[facility] and the [facility] count refill to [place].

## 2022-01-09 ENCOUNTER — Other Ambulatory Visit: Payer: Self-pay | Admitting: Internal Medicine

## 2022-01-09 ENCOUNTER — Inpatient Hospital Stay: Payer: Medicare HMO

## 2022-01-09 DIAGNOSIS — Z5112 Encounter for antineoplastic immunotherapy: Secondary | ICD-10-CM | POA: Diagnosis not present

## 2022-01-09 DIAGNOSIS — M4807 Spinal stenosis, lumbosacral region: Secondary | ICD-10-CM

## 2022-01-09 DIAGNOSIS — C189 Malignant neoplasm of colon, unspecified: Secondary | ICD-10-CM

## 2022-01-09 MED ORDER — GABAPENTIN 100 MG PO CAPS: ORAL_CAPSULE | ORAL | 0 refills | Status: DC

## 2022-01-09 MED ORDER — SODIUM CHLORIDE 0.9% FLUSH
10.0000 mL | INTRAVENOUS | Status: DC | PRN
  Administered 2022-01-09: 10 mL
  Filled 2022-01-09: qty 10

## 2022-01-09 MED ORDER — HEPARIN SOD (PORK) LOCK FLUSH 100 UNIT/ML IV SOLN
500.0000 [IU] | Freq: Once | INTRAVENOUS | Status: AC | PRN
  Administered 2022-01-09: 500 [IU]
  Filled 2022-01-09: qty 5

## 2022-01-09 NOTE — Telephone Encounter (Signed)
Requested Prescriptions  Pending Prescriptions Disp Refills  . FLUoxetine (PROZAC) 20 MG capsule [Pharmacy Med Name: FLUOXETINE HCL 20 MG CAPSULE] 270 capsule 1    Sig: TAKE 3 CAPSULES (60 MG TOTAL) BY MOUTH EVERY MORNING.     Psychiatry:  Antidepressants - SSRI Passed - 01/09/2022  2:12 AM      Passed - Completed PHQ-2 or PHQ-9 in the last 360 days      Passed - Valid encounter within last 6 months    Recent Outpatient Visits          2 months ago Pure hypercholesterolemia   [place], Coralie Keens, NP   4 months ago 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 [facility])   [facility], Coralie Keens, NP   5 months ago [facility] [place], Coralie Keens, NP   7 months ago Cervical myelopathy [facility])   [facility], NP   9 months ago Colonic mass   [facility] [place], Coralie Keens, NP      Future Appointments            In 2 weeks [place], Coralie Keens, NP [facility], [place]   In 3 months [place], Coralie Keens, NP [facility]

## 2022-01-09 NOTE — Telephone Encounter (Signed)
Sending out short term supply until pt gets mail order in Requested Prescriptions  Pending Prescriptions Disp Refills  . gabapentin (NEURONTIN) 100 MG capsule 120 capsule 0    Sig: TAKE 2 CAPSULES BY MOUTH 2 TIMES DAILY.     Neurology: Anticonvulsants - gabapentin Passed - 01/09/2022 12:01 PM      Passed - Cr in normal range and within 360 days    Creat  Date Value Ref Range Status  08/13/2020 0.87 0.60 - 0.93 mg/dL Final    Comment:    For patients >28 years of age, the reference limit for Creatinine is approximately 13% higher for people identified as African-American. .    Creatinine, Ser  Date Value Ref Range Status  01/07/2022 0.97 0.44 - 1.00 mg/dL Final         Passed - Completed PHQ-2 or PHQ-9 in the last 360 days      Passed - Valid encounter within last 12 months    Recent Outpatient Visits          2 months ago Pure hypercholesterolemia   [place], Coralie Keens, NP   4 months ago 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 [facility])   [facility], Coralie Keens, NP   5 months ago [facility] [place], Coralie Keens, NP   7 months ago Cervical myelopathy [facility])   [facility], NP   9 months ago Colonic mass   [facility] [place], Coralie Keens, NP      Future Appointments            In 2 weeks Garnette Gunner, Coralie Keens, NP [facility], [place]   In 3 months [place], Coralie Keens, NP [facility], [facility]

## 2022-01-09 NOTE — Telephone Encounter (Signed)
Medication: gabapentin (NEURONTIN) 100 MG capsule [[id number]] - Pt is requesting a short term dose to be sent to her local pharmacy  Has the patient contacted their pharmacy? YES  (Agent: If no, request that the patient contact the pharmacy for the refill. If patient does not wish to contact the pharmacy document the reason why and proceed with request.) (Agent: If yes, when and what did the pharmacy advise?)  Preferred Pharmacy (with phone number or street name): [name] [id number]: [phone]: [phone]: Not open 24 hours   Has the patient been seen for an appointment in the last year OR does the patient have an upcoming appointment? YES 01/28/22  Agent: Please be advised that RX refills may take up to 3 business days. We ask that you follow-up with your pharmacy.

## 2022-01-12 ENCOUNTER — Encounter: Payer: Self-pay | Admitting: Licensed Clinical Social Worker

## 2022-01-12 NOTE — Progress Notes (Signed)
[place] Work  Initial Assessment   Chelsea [name] is a 76 y.o. year old female contacted by phone. Clinical Social Work was referred by nurse navigator for assessment of psychosocial needs.   SDOH (Social Determinants of Health) assessments performed: Yes SDOH Interventions    Flowsheet Row Most Recent Value  SDOH Interventions   Food Insecurity Interventions Intervention Not Indicated  Financial Strain Interventions Intervention Not Indicated  Housing Interventions Intervention Not Indicated  Intimate Partner Violence Interventions Intervention Not Indicated  Physical Activity Interventions Intervention Not Indicated  Stress Interventions Provide Counseling  Social Connections Interventions Intervention Not Indicated  Transportation Interventions Intervention Not Indicated       Distress Screen completed: No    03/26/2021    2:21 PM  ONCBCN DISTRESS SCREENING  Screening Type Initial Screening  Distress experienced in past week (1-10) 3      Family/Social Information:  Housing Arrangement: patient lives with other residents  in assisted living facility Family members/support persons in your life? Family, Social research officer, government, Friends/Colleagues, Community , and assisted living facility community Transportation concerns: no  Employment: Retired. Income source: Paediatric nurse concerns: No Type of concern: None Food access concerns: no Religious or spiritual practice: yes Services Currently in place:  Medicare   Coping/ Adjustment to diagnosis: Patient understands treatment plan and what happens next? yes, but is having difficulty adjusting to diagnosis Concerns about diagnosis and/or treatment: Pain or discomfort during procedures and Afraid of cancer Patient reported stressors: Depression, Anxiety, Adjusting to my illness, and Facing my mortality Hopes and priorities: N/A Patient enjoys being outside, watching TV, and spending time with  family Current coping skills/ strengths: Average or above average intelligence , Capable of independent living , Communication skills , General fund of knowledge , and Supportive family/friends     SUMMARY: Current SDOH Barriers:  Adjustment to illness and diagnosis  Clinical Social Work Clinical Goal(s):  Patient's daughter, main caregiver Radford Pax Roth,Denise (Daughter) [phone] wil speak with patient about counseling and update CSW.  Interventions: Discussed common feeling and emotions when being diagnosed with cancer, and the importance of support during treatment Informed patient of the support team roles and support services at [facility] Provided CSW contact information and encouraged patient to call with any questions or concerns Referred patient to Designer, jewellery and Provided patient with information about CSW role in patient care and available resources.   Follow Up Plan: Patient will contact CSW with any support or resource needs Patient verbalizes understanding of plan: Yes    Monesha Monreal, LCSW

## 2022-01-13 ENCOUNTER — Other Ambulatory Visit: Payer: Self-pay | Admitting: Internal Medicine

## 2022-01-14 NOTE — Telephone Encounter (Signed)
Requested medication (s) are due for refill today: yes  Requested medication (s) are on the active medication list: yes  Last refill:  11/10/21 #30 1 refill  Future visit scheduled: yes in 2 weeks  Notes to clinic:  last ordered by [name] , MD. Do you want to refill Rx?     Requested Prescriptions  Pending Prescriptions Disp Refills   potassium chloride SA (KLOR-CON M) 20 MEQ tablet [Pharmacy Med Name: POTASSIUM CHLORIDE ER 20 MEQ Tablet Extended Release] 90 tablet     Sig: TAKE 1 TABLET EVERY DAY     Endocrinology:  Minerals - Potassium Supplementation Failed - 01/13/2022 12:56 PM      Failed - K in normal range and within 360 days    Potassium  Date Value Ref Range Status  01/07/2022 3.4 (L) 3.5 - 5.1 mmol/L Final          Passed - Cr in normal range and within 360 days    Creat  Date Value Ref Range Status  08/13/2020 0.87 0.60 - 0.93 mg/dL Final    Comment:    For patients >72 years of age, the reference limit for Creatinine is approximately 13% higher for people identified as African-American. .    Creatinine, Ser  Date Value Ref Range Status  01/07/2022 0.97 0.44 - 1.00 mg/dL Final          Passed - Valid encounter within last 12 months    Recent Outpatient Visits           2 months ago Pure hypercholesterolemia   [place], Coralie Keens, NP   4 months ago 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 [facility])   [facility], Coralie Keens, NP   6 months ago [facility] [place], Coralie Keens, NP   7 months ago Cervical myelopathy [facility])   [facility], NP   9 months ago Colonic mass   [facility] [place], Coralie Keens, NP       Future Appointments             In 2 weeks Garnette Gunner, Coralie Keens, NP [facility], [place]   In 3 months [place], Coralie Keens, NP [facility], [facility]

## 2022-01-20 ENCOUNTER — Telehealth: Payer: Self-pay | Admitting: *Deleted

## 2022-01-20 NOTE — Telephone Encounter (Signed)
*  If results are positive*  we will reschedule

## 2022-01-20 NOTE — Telephone Encounter (Signed)
Daughter called reporting that patient lives in an assisted lving facility and that there is an outbreak of COVID right now and patient is scheduled to come to office tomorrow  for treatment. Patient has not been exposed to those who have it that they know of.  She is asking if we can test her for COVID prior to her appointment tomorrow or what she needs to do. Please advise

## 2022-01-20 NOTE — Telephone Encounter (Signed)
Per Dr. Tasia Catchings, advised that patient get tested prior to her appt tomorrow. If results are negative ok to proceed with tomorrows tx. If results are negative we will reschedule

## 2022-01-21 ENCOUNTER — Inpatient Hospital Stay (HOSPITAL_BASED_OUTPATIENT_CLINIC_OR_DEPARTMENT_OTHER): Payer: Medicare HMO | Admitting: Oncology

## 2022-01-21 ENCOUNTER — Encounter: Payer: Self-pay | Admitting: Oncology

## 2022-01-21 ENCOUNTER — Inpatient Hospital Stay: Payer: Medicare HMO

## 2022-01-21 VITALS — BP 120/74 | HR 69 | Temp 96.4°F | Wt 178.1 lb

## 2022-01-21 DIAGNOSIS — C189 Malignant neoplasm of colon, unspecified: Secondary | ICD-10-CM

## 2022-01-21 DIAGNOSIS — D649 Anemia, unspecified: Secondary | ICD-10-CM

## 2022-01-21 DIAGNOSIS — C787 Secondary malignant neoplasm of liver and intrahepatic bile duct: Secondary | ICD-10-CM | POA: Diagnosis not present

## 2022-01-21 DIAGNOSIS — G629 Polyneuropathy, unspecified: Secondary | ICD-10-CM | POA: Diagnosis not present

## 2022-01-21 DIAGNOSIS — E876 Hypokalemia: Secondary | ICD-10-CM

## 2022-01-21 DIAGNOSIS — R634 Abnormal weight loss: Secondary | ICD-10-CM

## 2022-01-21 DIAGNOSIS — Z5112 Encounter for antineoplastic immunotherapy: Secondary | ICD-10-CM | POA: Diagnosis not present

## 2022-01-21 DIAGNOSIS — Z5111 Encounter for antineoplastic chemotherapy: Secondary | ICD-10-CM

## 2022-01-21 LAB — CBC WITH DIFFERENTIAL/PLATELET
Abs Immature Granulocytes: 0 10*3/uL (ref 0.00–0.07)
Basophils Absolute: 0 10*3/uL (ref 0.0–0.1)
Basophils Relative: 0 %
Eosinophils Absolute: 0.1 10*3/uL (ref 0.0–0.5)
Eosinophils Relative: 5 %
HCT: 31.9 % — ABNORMAL LOW (ref 36.0–46.0)
Hemoglobin: 10.1 g/dL — ABNORMAL LOW (ref 12.0–15.0)
Immature Granulocytes: 0 %
Lymphocytes Relative: 49 %
Lymphs Abs: 1.6 10*3/uL (ref 0.7–4.0)
MCH: 32 pg (ref 26.0–34.0)
MCHC: 31.7 g/dL (ref 30.0–36.0)
MCV: 100.9 fL — ABNORMAL HIGH (ref 80.0–100.0)
Monocytes Absolute: 0.3 10*3/uL (ref 0.1–1.0)
Monocytes Relative: 11 %
Neutro Abs: 1.1 10*3/uL — ABNORMAL LOW (ref 1.7–7.7)
Neutrophils Relative %: 35 %
Platelets: 131 10*3/uL — ABNORMAL LOW (ref 150–400)
RBC: 3.16 MIL/uL — ABNORMAL LOW (ref 3.87–5.11)
RDW: 18.7 % — ABNORMAL HIGH (ref 11.5–15.5)
WBC: 3.1 10*3/uL — ABNORMAL LOW (ref 4.0–10.5)
nRBC: 0 % (ref 0.0–0.2)

## 2022-01-21 LAB — COMPREHENSIVE METABOLIC PANEL
ALT: 10 U/L (ref 0–44)
AST: 18 U/L (ref 15–41)
Albumin: 3.3 g/dL — ABNORMAL LOW (ref 3.5–5.0)
Alkaline Phosphatase: 65 U/L (ref 38–126)
Anion gap: 5 (ref 5–15)
BUN: 15 mg/dL (ref 8–23)
CO2: 28 mmol/L (ref 22–32)
Calcium: 8.7 mg/dL — ABNORMAL LOW (ref 8.9–10.3)
Chloride: 103 mmol/L (ref 98–111)
Creatinine, Ser: 0.92 mg/dL (ref 0.44–1.00)
GFR, Estimated: >60 mL/min (ref >60)
Glucose, Bld: 113 mg/dL — ABNORMAL HIGH (ref 70–99)
Potassium: 3.5 mmol/L (ref 3.5–5.1)
Sodium: 136 mmol/L (ref 135–145)
Total Bilirubin: 0.6 mg/dL (ref 0.3–1.2)
Total Protein: 6.9 g/dL (ref 6.5–8.1)

## 2022-01-21 LAB — PROTEIN, URINE, RANDOM: Total Protein, Urine: <6 mg/dL

## 2022-01-21 MED ORDER — PROCHLORPERAZINE MALEATE 10 MG PO TABS
10.0000 mg | ORAL_TABLET | Freq: Once | ORAL | Status: AC
  Administered 2022-01-21: 10 mg via ORAL
  Filled 2022-01-21: qty 1

## 2022-01-21 MED ORDER — SODIUM CHLORIDE 0.9 % IV SOLN
2400.0000 mg/m2 | INTRAVENOUS | Status: DC
  Administered 2022-01-21: 4300 mg via INTRAVENOUS
  Filled 2022-01-21: qty 86

## 2022-01-21 MED ORDER — PALONOSETRON HCL INJECTION 0.25 MG/5ML
0.2500 mg | Freq: Once | INTRAVENOUS | Status: AC
  Administered 2022-01-21: 0.25 mg via INTRAVENOUS
  Filled 2022-01-21: qty 5

## 2022-01-21 MED ORDER — SODIUM CHLORIDE 0.9 % IV SOLN
5.0000 mg/kg | Freq: Once | INTRAVENOUS | Status: AC
  Administered 2022-01-21: 400 mg via INTRAVENOUS
  Filled 2022-01-21: qty 16

## 2022-01-21 MED ORDER — DEXTROSE 5 % IV SOLN
INTRAVENOUS | Status: DC
  Filled 2022-01-21: qty 250

## 2022-01-21 MED ORDER — SODIUM CHLORIDE 0.9 % IV SOLN
700.0000 mg | Freq: Once | INTRAVENOUS | Status: AC
  Administered 2022-01-21: 700 mg via INTRAVENOUS
  Filled 2022-01-21: qty 35

## 2022-01-21 MED ORDER — SODIUM CHLORIDE 0.9 % IV SOLN
Freq: Once | INTRAVENOUS | Status: AC
  Filled 2022-01-21: qty 250

## 2022-01-21 MED ORDER — OXALIPLATIN CHEMO INJECTION 100 MG/20ML
65.0000 mg/m2 | Freq: Once | INTRAVENOUS | Status: AC
  Administered 2022-01-21: 120 mg via INTRAVENOUS
  Filled 2022-01-21: qty 10

## 2022-01-21 NOTE — Progress Notes (Signed)
Pt unable to provide urine sample at this time. Per Dr Tasia Catchings okay to proceed with oxaliplatin first

## 2022-01-21 NOTE — Progress Notes (Signed)
Hematology/Oncology Progress note [phone] [phone]         Patient Care Team: Chelsea Fenton, NP as PCP - General (Internal Medicine) Clent Jacks, RN as Oncology Nurse Navigator [name], MD as Consulting Physician (Oncology)  REFERRING PROVIDER: Jearld Fenton, NP  CHIEF COMPLAINTS/REASON FOR VISIT:  metastatic colon cancer  HISTORY OF PRESENTING ILLNESS:   Chelsea [name] is a  76 y.o.  female with PMH listed below was seen in consultation at the request of  Chelsea Fenton, NP  for evaluation of metastatic colon cancer.  Patient initially went to emergency room on 03/24/2021 for abdominal pain. 03/24/2021, CT scan of the abdomen showed marked bladder wall thickening with surrounding soft tissue stranding is noted.  Concerning for cystitis.  There is a lobulated mass between the posterior wall of the bladder and the rectum which appears to arise from the vaginal cuff.  This is indeterminate and difficult to characterize reflecting lack of IV contrast material.  Nonobstructing left renal calculus, left sacroiliitis, lumbar spondylosis aortic atherosclerosis. 03/24/2021 CT pelvic showed abdominal [place] soft tissue of right cecum, concerning for colon carcinoma.  Colonoscopy recommended.  Low-attenuation mass in the region of vaginal cuff is again noted.  Diffuse urinary bladder wall thickening and mild bilateral hydroureter possibly cystitis.  [place] 03/24/2021 Lobulated solid 3.8 cm mass confirmed by ultrasound. This is most compatible with a neoplasm but remains indeterminate as to the origin as the epicenter seems to be outside of both the vaginal cuff and the adjacent colon, while the lesion appears inseparable from both. Extensive echogenic debris within the distended urinary bladder.   03/26/2021, patient was seen by Dr. Theora Gianotti for evaluation of colonic/vaginal cuff pelvic mass.  Patient also was referred to gastroenterology for  colonoscopy.  03/26/2021 CEA 3.4.  CA125 9.7 04/04/2021, status post colonoscopy which showed a frond like /villous nonobstructing large mass was found in the cecum, this was biopsied.  Terminal ileum was briefly intubated and appeared normal.  Diverticulosis in the sigmoid colon.  The rectum, sigmoid colon, descending colon, transverse colon and ascending colon were normal.  Nonbleeding internal hemorrhoids. 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    Given that the biopsy may not be representative of the entire underlying lesion.  Patient was recommended to establish with surgeon for evaluation. Patient no showed for her surgery appointment and as well as her follow-up appointment with gastroenterology.  Per daughter, patient was having cervical spine surgery and she prioritized that over the colon surgery.  08/19/2021, patient establish care with surgery Dr. Christian Mate 08/21/2021, CT abdomen pelvis with contrast  showed soft tissue mass at the base of cecum measuring up to 5 cm, slightly increased in size.  Interval development of multiple low-density lesions throughout the liver highly suggestive for metastatic disease.  Complex mass in the region of the vaginal cuff measuring up to 5.2 cm, increased in size.  Right ovary also appears more prominent on the current examination.  Moderate large volume of stool throughout the colon.  Mild urinary bladder wall thickening.  Colonic diverticulosis without evidence of active diverticulitis.  Aortic atherosclerosis  09/04/2021, patient is status post left lobe liver lesion biopsy and pathology showed metastatic adenocarcinoma, compatible with colorectal primary.  She was referred to establish care with oncology. She was accompanied by her daughter today.  Patient walks with a walker.+ Night sweats + weight loss.  Denies any abdominal pain currently, melena, blood in the stool.  She is  currently being followed by home health  PT/OT following a previous spinal surgery  10/10/2020, status post Mediport placement. 10/20/2020, cycle one 5-FU/bevacizumab.  Foundation one liquid biopsy testing showed KRASG12V, APC TP53 TMB 4  12/22/2021 CT chest abdomen pelvis Decreased cecum mass, however increased size and size of metastatic lesions throughout the liver.  Interval enlargement of vaginal cuff mass. No mets in chest.   # During the interval, patient was seen by gynecology oncology Dr. Fransisca Connors.  Biopsy of the vaginal mass was not recommended. Patient's case was also discussed on multidisciplinary tumor board.  IR potentially can try a biopsy if patient is willing to. I had a discussion with patient's daughter over the phone prior to this visit.  We discussed about option of adding oxaliplatin to 5-FU/bevacizumab and continue treatment of colon cancer, repeat CT scan short-term and if progression, repeat biopsy versus proceeding with vaginal mass biopsy.  Daughter prefers proceeding with chemotherapy and defer biopsy.   01/07/22, FOLFOX/bev INTERVAL HISTORY Chelsea [name] is a 76 y.o. female who has above history reviewed by me today presents for follow up visit for management of metastatic colon cancer Patient was accompanied by her son-in-law Chelsea [name].  Patient reports feeling well.  She tolerates chemotherapy well so far.  Chronic neck pain, right arm numbness tingling unchanged. During the interval, patient has had a second opinion at [facility] and was seen by Dr. Reynaldo Minium on 01/08/2022.  Dr. Reynaldo Minium agrees with the current treatment plan with FOLFOX/bevacizumab.  He has ordered guardant 360 to see if she may be eligible for clinical trial in the future.  Review of Systems  Constitutional:  Positive for fatigue and unexpected weight change. Negative for appetite change, chills and fever.  HENT:   Negative for hearing loss and voice change.   Eyes:  Negative for eye problems.  Respiratory:  Negative for chest tightness and  cough.   Cardiovascular:  Negative for chest pain.  Gastrointestinal:  Negative for abdominal distention, abdominal pain, blood in stool and diarrhea.  Endocrine: Negative for hot flashes.  Genitourinary:  Negative for difficulty urinating and frequency.   Musculoskeletal:  Positive for back pain. Negative for arthralgias.  Skin:  Negative for itching and rash.  Neurological:  Positive for numbness (chornic finger tips). Negative for extremity weakness.  Hematological:  Negative for adenopathy.  Psychiatric/Behavioral:  Negative for confusion.    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)    Moderate mitral insufficiency    Port-A-Cath in place    Seizures [facility])    Spinal stenosis    Ulcer    Urinary incontinence     SURGICAL HISTORY: Past Surgical History:  Procedure Laterality Date   ABDOMINAL HYSTERECTOMY     BACK SURGERY     due to polio   BREAST BIOPSY Bilateral    neg   BREAST BIOPSY Right 2011   neg/stereo   CARPAL TUNNEL RELEASE     CATARACT EXTRACTION Right 2020   COLONOSCOPY WITH PROPOFOL N/A 04/04/2021   Procedure: COLONOSCOPY WITH PROPOFOL;  Surgeon: Virgel Manifold, MD;  Location: [facility] ENDOSCOPY;  Service: Endoscopy;  Laterality: N/A;   EYE SURGERY     IR IMAGING GUIDED PORT INSERTION  10/09/2021    SOCIAL HISTORY: Social History   Socioeconomic History   Marital  status: Divorced    Spouse name: Not on file   Number of children: Not on file   Years of education: Not on file   Highest education level: Not on file  Occupational History   Not on file  Tobacco Use   Smoking status: Never   Smokeless tobacco: Never  Vaping Use   Vaping Use: Never used  Substance and Sexual Activity   Alcohol use: Never   Drug use: No   Sexual activity: Not Currently  Other Topics Concern   Not on file  Social History  Narrative   Not on file   Social Determinants of Health   Financial Resource Strain: Low Risk    Difficulty of Paying Living Expenses: Not very hard  Food Insecurity: No Food Insecurity   Worried About Running Out of Food in the Last Year: Never true   Ran Out of Food in the Last Year: Never true  Transportation Needs: No Transportation Needs   Lack of Transportation (Medical): No   Lack of Transportation (Non-Medical): No  Physical Activity: Inactive   Days of Exercise per Week: 0 days   Minutes of Exercise per Session: 0 min  Stress: Stress Concern Present   Feeling of Stress : To some extent  Social Connections: Moderately Integrated   Frequency of Communication with Friends and Family: Three times a week   Frequency of Social Gatherings with Friends and Family: Three times a week   Attends Religious Services: 1 to 4 times per year   Active Member of Clubs or Organizations: No   Attends Archivist Meetings: 1 to 4 times per year   Marital Status: Widowed  Human resources officer Violence: Not At Risk   Fear of Current or Ex-Partner: No   Emotionally Abused: No   Physically Abused: No   Sexually Abused: No    FAMILY HISTORY: Family History  Problem Relation Age of Onset   Arthritis Mother    Heart disease Mother    Stroke Mother    Hypertension Mother    COPD Mother    Sudden death Sister    Other Father        unknown medical history   Breast cancer Neg Hx     ALLERGIES:  is allergic to penicillins.  MEDICATIONS:  Current Outpatient Medications  Medication Sig Dispense Refill   acetaminophen (TYLENOL) 500 MG tablet Take 500 mg by mouth every 6 (six) hours as needed.     ALPRAZolam (XANAX) 0.5 MG tablet TAKE 1 TABLET BY MOUTH TWICE A DAY AS NEEDED FOR ANXIETY 60 tablet 0   aspirin 325 MG tablet Take 325 mg by mouth daily.     atorvastatin (LIPITOR) 10 MG tablet Take 1 tablet (10 mg total) by mouth daily. 30 tablet 2   busPIRone (BUSPAR) 5 MG tablet Take 1  tablet (5 mg total) by mouth 3 (three) times daily. 270 tablet 1   Cyanocobalamin (VITAMIN B 12 PO) Take 500 mcg by mouth daily.     diclofenac Sodium (VOLTAREN) 1 % GEL Apply 4 g topically 4 (four) times daily. 4 g 0   FLUoxetine (PROZAC) 20 MG capsule TAKE 3 CAPSULES (60 MG TOTAL) BY MOUTH EVERY MORNING. 270 capsule 0   gabapentin (NEURONTIN) 100 MG capsule TAKE 2 CAPSULES BY MOUTH 2 TIMES DAILY. 40 capsule 0   lidocaine-prilocaine (EMLA) cream Apply small amount of cream to port site 1-2 hour prior to chemo treatment. 30 g 3   meclizine (ANTIVERT) 12.5 MG  tablet Take 1 tablet (12.5 mg total) by mouth 3 (three) times daily as needed for dizziness. 30 tablet 0   methocarbamol (ROBAXIN) 500 MG tablet TAKE 1 TABLET TWICE DAILY AS NEEDED FOR MUSCLE SPASM(S) 60 tablet 0   ondansetron (ZOFRAN) 8 MG tablet Take 1 tablet (8 mg total) by mouth 2 (two) times daily as needed (Nausea or vomiting). 30 tablet 1   polyethylene glycol powder (GLYCOLAX/MIRALAX) 17 GM/SCOOP powder Take 17 g by mouth daily. 3350 g 1   potassium chloride SA (KLOR-CON M) 20 MEQ tablet TAKE 1 TABLET EVERY DAY 90 tablet 0   prochlorperazine (COMPAZINE) 10 MG tablet Take 1 tablet (10 mg total) by mouth every 6 (six) hours as needed (Nausea or vomiting). 30 tablet 1   triamterene-hydrochlorothiazide (MAXZIDE-25) 37.5-25 MG tablet Take 1 tablet by mouth daily. 90 tablet 1   vitamin E 180 MG (400 UNITS) capsule Take 400 Units by mouth daily.     lidocaine (LIDODERM) 5 % Place 1 patch onto the skin daily. Remove & Discard patch within 12 hours or as directed by MD (Patient not taking: Reported on 12/24/2021) 1 patch 0   loperamide (IMODIUM) 2 MG capsule Take 1 capsule (2 mg total) by mouth See admin instructions. Initial: 4 mg, followed by 2 mg after each loose stool; maximum: 16 mg/day (Patient not taking: Reported on 12/24/2021) 60 capsule 0   No current facility-administered medications for this visit.   Facility-Administered Medications  Ordered in Other Visits  Medication Dose Route Frequency Provider Last Rate Last Admin   dextrose 5 % solution   Intravenous Continuous [name], MD 10 mL/hr at 01/21/22 0944 New Bag at 01/21/22 0944   fluorouracil (ADRUCIL) 4,300 mg in sodium chloride 0.9 % 64 mL chemo infusion  2,400 mg/m2 (Order-Specific) Intravenous 1 day or 1 dose [name], MD       leucovorin 700 mg in sodium chloride 0.9 % 250 mL infusion  700 mg Intravenous Once [name], MD 143 mL/hr at 01/21/22 1037 700 mg at 01/21/22 1037   oxaliplatin (ELOXATIN) 120 mg in dextrose 5 % 500 mL chemo infusion  65 mg/m2 (Treatment Plan Recorded) Intravenous Once [name], MD 262 mL/hr at 01/21/22 1040 120 mg at 01/21/22 1040     PHYSICAL EXAMINATION: ECOG PERFORMANCE STATUS: 2 - Symptomatic, <50% confined to bed Vitals:   01/21/22 0833  BP: 120/74  Pulse: 69  Temp: (!) 96.4 F (35.8 C)   Filed Weights   01/21/22 0833  Weight: 178 lb 1.6 oz (80.8 kg)     Physical Exam Constitutional:      General: She is not in acute distress.    Comments: Patient sits in wheelchair  HENT:     Head: Normocephalic and atraumatic.  Eyes:     General: No scleral icterus. Cardiovascular:     Rate and Rhythm: Normal rate and regular rhythm.     Heart sounds: Normal heart sounds.  Pulmonary:     Effort: Pulmonary effort is normal. No respiratory distress.     Breath sounds: No wheezing.  Abdominal:     General: Bowel sounds are normal. There is no distension.     Palpations: Abdomen is soft.  Musculoskeletal:        General: No deformity. Normal range of motion.     Cervical back: Normal range of motion and neck supple.  Skin:    General: Skin is warm and dry.     Findings: No erythema or rash.  Neurological:     Mental Status: She is alert and oriented to person, place, and time. Mental status is at baseline.     Cranial Nerves: No cranial nerve deficit.     Coordination: Coordination normal.  Psychiatric:        Mood and Affect:  Mood normal.    LABORATORY DATA:  I have reviewed the data as listed Lab Results  Component Value Date   WBC 3.1 (L) 01/21/2022   HGB 10.1 (L) 01/21/2022   HCT 31.9 (L) 01/21/2022   MCV 100.9 (H) 01/21/2022   PLT 131 (L) 01/21/2022   Recent Labs    12/24/21 0752 01/07/22 0809 01/21/22 0809  NA 137 136 136  K 3.6 3.4* 3.5  CL 100 104 103  CO2 30 28 28   GLUCOSE 91 127* 113*  BUN 15 18 15   CREATININE 0.98 0.97 0.92  CALCIUM 9.3 8.8* 8.7*  GFRNONAA 60* >60 >60  PROT 7.2 7.1 6.9  ALBUMIN 3.7 3.6 3.3*  AST 21 20 18   ALT 15 16 10   ALKPHOS 75 64 65  BILITOT 0.6 0.5 0.6    Iron/TIBC/Ferritin/ %Sat    Component Value Date/Time   FERRITIN 58 10/24/2020 0402       RADIOGRAPHIC STUDIES: I have personally reviewed the radiological images as listed and agreed with the findings in the report. CT CHEST ABDOMEN PELVIS W CONTRAST  Result Date: 12/23/2021 CLINICAL DATA:  76 year old female with history of metastatic colon cancer. * Tracking Code: BO * EXAM: CT CHEST, ABDOMEN, AND PELVIS WITH CONTRAST TECHNIQUE: Multidetector CT imaging of the chest, abdomen and pelvis was performed following the standard protocol during bolus administration of intravenous contrast. RADIATION DOSE REDUCTION: This exam was performed according to the departmental dose-optimization program which includes automated exposure control, adjustment of the mA and/or kV according to patient size and/or use of iterative reconstruction technique. CONTRAST:  156m OMNIPAQUE IOHEXOL 300 MG/ML  SOLN COMPARISON:  Multiple priors, most recently chest CT 09/22/2021. CT the abdomen and pelvis 08/21/2021. FINDINGS: CT CHEST FINDINGS Cardiovascular: Heart size is normal. There is no significant pericardial fluid, thickening or pericardial calcification. Aortic atherosclerosis. No definite coronary artery calcifications. Left-sided internal jugular single-lumen porta cath with tip terminating at the superior cavoatrial junction.  Mediastinum/Nodes: No pathologically enlarged mediastinal or hilar lymph nodes. Multiple densely calcified mediastinal and bilateral hilar lymph nodes are incidentally noted. Esophagus is unremarkable in appearance. No axillary lymphadenopathy. Lungs/Pleura: Calcified granulomas are noted in the lungs bilaterally. No other definite suspicious appearing pulmonary nodules or masses are noted. No acute consolidative airspace disease. No pleural effusions. Scarring in the apex of the right lung and in the periphery of the left lung base, similar to the prior study. Musculoskeletal: Kyphotic deformity of the midthoracic spine again noted with apparent congenital fusion of T6, T7 and T8. There are no aggressive appearing lytic or blastic lesions noted in the visualized portions of the skeleton. CT ABDOMEN PELVIS FINDINGS Hepatobiliary: Increased number and size of numerous hypovascular lesions scattered throughout the liver, indicative of progressive metastatic disease, largest of which is located between segments 2 and 3 in the left lobe of the liver (axial image 43 of series 2) measuring 3.1 x 2.3 cm on today's study (previously only 7 mm). No intra or extrahepatic biliary ductal dilatation. Gallbladder is nearly decompressed, but otherwise unremarkable in appearance. Pancreas: No pancreatic mass. No pancreatic ductal dilatation. No pancreatic or peripancreatic fluid collections or inflammatory changes. Spleen: Unremarkable. Adrenals/Urinary Tract: 3 mm nonobstructive calculus  in the upper pole of the left kidney and interpolar collecting system of the right kidney. Multifocal cortical thinning in both kidneys. No suspicious renal lesions. No hydroureteronephrosis. Urinary bladder is normal in appearance. Bilateral adrenal glands are normal in appearance. Stomach/Bowel: Normal appearance of the stomach. No pathologic dilatation of small bowel or colon. A few scattered colonic diverticulae are noted, without surrounding  inflammatory changes to suggest an acute diverticulitis at this time. Some regression of previously noted cecal mass (axial image 67 of series 2) which currently measures approximately 3.2 x 2.3 cm, suggesting positive response to therapy. Appendix is not visualized, likely surgically absent. Vascular/Lymphatic: Aortic atherosclerosis, without evidence of aneurysm or dissection in the abdominal or pelvic vasculature. No definite lymphadenopathy confidently identified in the abdomen or pelvis. Reproductive: Status post hysterectomy. Again noted is a large soft tissue mass in the region of the vaginal cuff which has increased substantially in size (axial image 82 of series 2 and sagittal image 139 of series 7), currently measuring 5.8 x 4.3 x 5.9 cm. This exerts local mass effect upon adjacent structures displacing the urinary bladder slightly anteriorly and the rectum posteriorly. Ovaries are unremarkable in appearance. Other: No significant volume of ascites.  No pneumoperitoneum. Musculoskeletal: There are no aggressive appearing lytic or blastic lesions noted in the visualized portions of the skeleton. IMPRESSION: 1. Today's study demonstrates a mixed response to therapy. Specifically, the previously described cecal mass appears smaller than the prior study, however, there is increased number and size of metastatic lesions throughout the liver. 2. In addition, there has been interval enlargement of a mass in the low anatomic pelvis which is intimately associated with the vaginal cuff. Whether this represents a metastatic deposit in the low anatomic pelvis, or a primary lesion of the cervix or vagina is uncertain. Regardless, this is clearly enlarged, currently measuring 5.8 x 4.3 x 5.9 cm. 3. No definitive imaging findings to suggest metastatic disease in the thorax. 4. Old granulomatous disease, as above. 5. Aortic atherosclerosis. 6. Nonobstructive calculi measuring 3 mm in both renal collecting systems. 7.  Additional incidental findings, as above. Electronically Signed   By: Vinnie Langton M.D.   On: 12/23/2021 06:54      ASSESSMENT & PLAN:  1. Metastatic colon cancer to liver ([place])   2. Encounter for antineoplastic chemotherapy   3. Weight loss   4. Neuropathy   5. Normocytic anemia   6. Hypokalemia    Cancer Staging  Metastatic colon cancer to liver [facility]) Staging form: Colon and Rectum - Neuroendocine Tumors, AJCC 8th Edition - Clinical stage from 09/16/2021: Stage IV (cTX, cNX, cM1) - Signed by [name], MD on 09/16/2021  #Metastatic colon cancer to liver, 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  No sufficient tissue for NGS Liquid biopsy showed KRAS12V, APC, TP53 mutation, TMB 4.  S/p 6 cycles of 5-FU /Bevacizumab--> CT showed mixed response--> FOLFOX/bevacizumab.  Labs reviewed and discussed with patient.  She tolerates well. Proceed with cycle 2 FOLFOX/bevacizumab.  #Chemotherapy-induced neutropenia, ANC 1.1.  Patient has previously developed neutropenia on 5-FU/bevacizumab with an ANC of 0.9.  She lives in a facility, above 25 years old, multiple other medical problems.  I recommend prophylactic G-CSF with Udenyca.  Rationale and potential side effects of Udenyca were reviewed and discussed with patient.  She agrees with the plan.  Recommend Claritin 10 mg daily for 4 days.  She may take Tylenol if she experiences bone pain.  #Vaginal cuff complex mass, possible metastasis from colon cancer versus a second  primary.  Shared decision was made to defer biopsy currently, proceed with treatment for metastatic colon cancer and repeat short-term CT for follow-up.  #Hypokalemia,continue potassium chloride 32mq daily.   #Weight loss, follow-up with nutritionist.  Continue nutrition supplementation.  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  Stable.  Continue gabapentin . Supportive care measures are necessary for patient well-being and will be provided as necessary. \All questions were answered. The  patient knows to call the clinic with any problems questions or concerns.  cc BJearld Fenton NP   Follow-up lab MD 2 weeks FOLFOX/bevacizumab.  [name], PhD 01/21/2022

## 2022-01-21 NOTE — Patient Instructions (Signed)
[facility] [place]  Discharge Instructions: Thank you for choosing [facility] to provide your oncology and hematology care.  If you have a lab appointment with the [facility], please go directly to the [facility] and check in at the registration area.  Wear comfortable clothing and clothing appropriate for easy access to any Portacath or PICC line.   We strive to give you quality time with your provider. You may need to reschedule your appointment if you arrive late (15 or more minutes).  Arriving late affects you and other patients whose appointments are after yours.  Also, if you miss three or more appointments without notifying the office, you may be dismissed from the clinic at the provider's discretion.      For prescription refill requests, have your pharmacy contact our office and allow 72 hours for refills to be completed.    Today you received the following chemotherapy and/or immunotherapy agents      To help prevent nausea and vomiting after your treatment, we encourage you to take your nausea medication as directed.  BELOW ARE SYMPTOMS THAT SHOULD BE REPORTED IMMEDIATELY: *FEVER GREATER THAN 100.4 F (38 C) OR HIGHER *CHILLS OR SWEATING *NAUSEA AND VOMITING THAT IS NOT CONTROLLED WITH YOUR NAUSEA MEDICATION *UNUSUAL SHORTNESS OF BREATH *UNUSUAL BRUISING OR BLEEDING *URINARY PROBLEMS (pain or burning when urinating, or frequent urination) *BOWEL PROBLEMS (unusual diarrhea, constipation, pain near the anus) TENDERNESS IN MOUTH AND THROAT WITH OR WITHOUT PRESENCE OF ULCERS (sore throat, sores in mouth, or a toothache) UNUSUAL RASH, SWELLING OR PAIN  UNUSUAL VAGINAL DISCHARGE OR ITCHING   Items with * indicate a potential emergency and should be followed up as soon as possible or go to the Emergency Department if any problems should occur.  Please show the CHEMOTHERAPY ALERT CARD or IMMUNOTHERAPY ALERT CARD at check-in to the  Emergency Department and triage nurse.  Should you have questions after your visit or need to cancel or reschedule your appointment, please contact [facility] [place] [facility] [place]  [phone] and follow the prompts.  Office hours are 8:00 a.m. to 4:30 p.m. Monday - Friday. Please note that voicemails left after 4:00 p.m. may not be returned until the following business day.  We are closed weekends and major holidays. You have access to a nurse at all times for urgent questions. Please call the main number to the clinic [phone] and follow the prompts.  For any non-urgent questions, you may also contact your provider using MyChart. We now offer e-Visits for anyone 53 and older to request care online for non-urgent symptoms. For details visit [email or web address].   Also download the MyChart app! Go to the app store, search "MyChart", open the app, select [facility], and log in with your MyChart username and password.  Due to Covid, a mask is required upon entering the hospital/clinic. If you do not have a mask, one will be given to you upon arrival. For doctor visits, patients may have 1 support person aged 58 or older with them. For treatment visits, patients cannot have anyone with them due to current Covid guidelines and our immunocompromised population. Fluorouracil, 5-FU injection What is this medication? FLUOROURACIL, 5-FU (flure oh YOOR a sil) is a chemotherapy drug. It slows the growth of cancer cells. This medicine is used to treat many types of cancer like breast cancer, colon or rectal cancer, pancreatic cancer, and stomach cancer. This medicine may be used for other purposes; ask your health  care provider or pharmacist if you have questions. COMMON BRAND NAME(S): Adrucil What should I tell my care team before I take this medication? They need to know if you have any of these conditions: blood disorders dihydropyrimidine dehydrogenase (DPD) deficiency infection  (especially a virus infection such as chickenpox, cold sores, or herpes) kidney disease liver disease malnourished, poor nutrition recent or ongoing radiation therapy an unusual or allergic reaction to fluorouracil, other chemotherapy, other medicines, foods, dyes, or preservatives pregnant or trying to get pregnant breast-feeding How should I use this medication? This drug is given as an infusion or injection into a vein. It is administered in a hospital or clinic by a specially trained health care professional. Talk to your pediatrician regarding the use of this medicine in children. Special care may be needed. Overdosage: If you think you have taken too much of this medicine contact a poison control center or emergency room at once. NOTE: This medicine is only for you. Do not share this medicine with others. What if I miss a dose? It is important not to miss your dose. Call your doctor or health care professional if you are unable to keep an appointment. What may interact with this medication? Do not take this medicine with any of the following medications: live virus vaccines This medicine may also interact with the following medications: medicines that treat or prevent blood clots like warfarin, enoxaparin, and dalteparin This list may not describe all possible interactions. Give your health care provider a list of all the medicines, herbs, non-prescription drugs, or dietary supplements you use. Also tell them if you smoke, drink alcohol, or use illegal drugs. Some items may interact with your medicine. What should I watch for while using this medication? Visit your doctor for checks on your progress. This drug may make you feel generally unwell. This is not uncommon, as chemotherapy can affect healthy cells as well as cancer cells. Report any side effects. Continue your course of treatment even though you feel ill unless your doctor tells you to stop. In some cases, you may be given  additional medicines to help with side effects. Follow all directions for their use. Call your doctor or health care professional for advice if you get a fever, chills or sore throat, or other symptoms of a cold or flu. Do not treat yourself. This drug decreases your body's ability to fight infections. Try to avoid being around people who are sick. This medicine may increase your risk to bruise or bleed. Call your doctor or health care professional if you notice any unusual bleeding. Be careful brushing and flossing your teeth or using a toothpick because you may get an infection or bleed more easily. If you have any dental work done, tell your dentist you are receiving this medicine. Avoid taking products that contain aspirin, acetaminophen, ibuprofen, naproxen, or ketoprofen unless instructed by your doctor. These medicines may hide a fever. Do not become pregnant while taking this medicine. Women should inform their doctor if they wish to become pregnant or think they might be pregnant. There is a potential for serious side effects to an unborn child. Talk to your health care professional or pharmacist for more information. Do not breast-feed an infant while taking this medicine. Men should inform their doctor if they wish to father a child. This medicine may lower sperm counts. Do not treat diarrhea with over the counter products. Contact your doctor if you have diarrhea that lasts more than 2 days or if  it is severe and watery. This medicine can make you more sensitive to the sun. Keep out of the sun. If you cannot avoid being in the sun, wear protective clothing and use sunscreen. Do not use sun lamps or tanning beds/booths. What side effects may I notice from receiving this medication? Side effects that you should report to your doctor or health care professional as soon as possible: allergic reactions like skin rash, itching or hives, swelling of the face, lips, or tongue low blood counts - this  medicine may decrease the number of white blood cells, red blood cells and platelets. You may be at increased risk for infections and bleeding. signs of infection - fever or chills, cough, sore throat, pain or difficulty passing urine signs of decreased platelets or bleeding - bruising, pinpoint red spots on the skin, black, tarry stools, blood in the urine signs of decreased red blood cells - unusually weak or tired, fainting spells, lightheadedness breathing problems changes in vision chest pain mouth sores nausea and vomiting pain, swelling, redness at site where injected pain, tingling, numbness in the hands or feet redness, swelling, or sores on hands or feet stomach pain unusual bleeding Side effects that usually do not require medical attention (report to your doctor or health care professional if they continue or are bothersome): changes in finger or toe nails diarrhea dry or itchy skin hair loss headache loss of appetite sensitivity of eyes to the light stomach upset unusually teary eyes This list may not describe all possible side effects. Call your doctor for medical advice about side effects. You may report side effects to FDA at 1-800-FDA-1088. Where should I keep my medication? This drug is given in a hospital or clinic and will not be stored at home. NOTE: This sheet is a summary. It may not cover all possible information. If you have questions about this medicine, talk to your doctor, pharmacist, or health care provider.  2023 Elsevier/Gold Standard (2021-08-22 00:00:00) Bevacizumab injection What is this medication? BEVACIZUMAB (be va SIZ yoo mab) is a monoclonal antibody. It is used to treat many types of cancer. This medicine may be used for other purposes; ask your health care provider or pharmacist if you have questions. COMMON BRAND NAME(S): Alymsys, Avastin, MVASI, Noah Charon What should I tell my care team before I take this medication? They need to know if  you have any of these conditions: diabetes heart disease high blood pressure history of coughing up blood prior anthracycline chemotherapy (e.g., doxorubicin, daunorubicin, epirubicin) recent or ongoing radiation therapy recent or planning to have surgery stroke an unusual or allergic reaction to bevacizumab, hamster proteins, mouse proteins, other medicines, foods, dyes, or preservatives pregnant or trying to get pregnant breast-feeding How should I use this medication? This medicine is for infusion into a vein. It is given by a health care professional in a hospital or clinic setting. Talk to your pediatrician regarding the use of this medicine in children. Special care may be needed. Overdosage: If you think you have taken too much of this medicine contact a poison control center or emergency room at once. NOTE: This medicine is only for you. Do not share this medicine with others. What if I miss a dose? It is important not to miss your dose. Call your doctor or health care professional if you are unable to keep an appointment. What may interact with this medication? Interactions are not expected. This list may not describe all possible interactions. Give your health care provider  a list of all the medicines, herbs, non-prescription drugs, or dietary supplements you use. Also tell them if you smoke, drink alcohol, or use illegal drugs. Some items may interact with your medicine. What should I watch for while using this medication? Your condition will be monitored carefully while you are receiving this medicine. You will need important blood work and urine testing done while you are taking this medicine. This medicine may increase your risk to bruise or bleed. Call your doctor or health care professional if you notice any unusual bleeding. Before having surgery, talk to your health care provider to make sure it is ok. This drug can increase the risk of poor healing of your surgical site or  wound. You will need to stop this drug for 28 days before surgery. After surgery, wait at least 28 days before restarting this drug. Make sure the surgical site or wound is healed enough before restarting this drug. Talk to your health care provider if questions. Do not become pregnant while taking this medicine or for 6 months after stopping it. Women should inform their doctor if they wish to become pregnant or think they might be pregnant. There is a potential for serious side effects to an unborn child. Talk to your health care professional or pharmacist for more information. Do not breast-feed an infant while taking this medicine and for 6 months after the last dose. This medicine has caused ovarian failure in some women. This medicine may interfere with the ability to have a child. You should talk to your doctor or health care professional if you are concerned about your fertility. What side effects may I notice from receiving this medication? Side effects that you should report to your doctor or health care professional as soon as possible: allergic reactions like skin rash, itching or hives, swelling of the face, lips, or tongue chest pain or chest tightness chills coughing up blood high fever seizures severe constipation signs and symptoms of bleeding such as bloody or black, tarry stools; red or dark-brown urine; spitting up blood or brown material that looks like coffee grounds; red spots on the skin; unusual bruising or bleeding from the eye, gums, or nose signs and symptoms of a blood clot such as breathing problems; chest pain; severe, sudden headache; pain, swelling, warmth in the leg signs and symptoms of a stroke like changes in vision; confusion; trouble speaking or understanding; severe headaches; sudden numbness or weakness of the face, arm or leg; trouble walking; dizziness; loss of balance or coordination stomach pain sweating swelling of legs or ankles vomiting weight  gain Side effects that usually do not require medical attention (report to your doctor or health care professional if they continue or are bothersome): back pain changes in taste decreased appetite dry skin nausea tiredness This list may not describe all possible side effects. Call your doctor for medical advice about side effects. You may report side effects to FDA at 1-800-FDA-1088. Where should I keep my medication? This drug is given in a hospital or clinic and will not be stored at home. NOTE: This sheet is a summary. It may not cover all possible information. If you have questions about this medicine, talk to your doctor, pharmacist, or health care provider.  2023 Elsevier/Gold Standard (2021-08-22 00:00:00) Leucovorin injection What is this medication? LEUCOVORIN (loo koe VOR in) is used to prevent or treat the harmful effects of some medicines. This medicine is used to treat anemia caused by a low amount of folic  acid in the body. It is also used with 5-fluorouracil (5-FU) to treat colon cancer. This medicine may be used for other purposes; ask your health care provider or pharmacist if you have questions. What should I tell my care team before I take this medication? They need to know if you have any of these conditions: anemia from low levels of vitamin B-12 in the blood an unusual or allergic reaction to leucovorin, folic acid, other medicines, foods, dyes, or preservatives pregnant or trying to get pregnant breast-feeding How should I use this medication? This medicine is for injection into a muscle or into a vein. It is given by a health care professional in a hospital or clinic setting. Talk to your pediatrician regarding the use of this medicine in children. Special care may be needed. Overdosage: If you think you have taken too much of this medicine contact a poison control center or emergency room at once. NOTE: This medicine is only for you. Do not share this medicine  with others. What if I miss a dose? This does not apply. What may interact with this medication? capecitabine fluorouracil phenobarbital phenytoin primidone trimethoprim-sulfamethoxazole This list may not describe all possible interactions. Give your health care provider a list of all the medicines, herbs, non-prescription drugs, or dietary supplements you use. Also tell them if you smoke, drink alcohol, or use illegal drugs. Some items may interact with your medicine. What should I watch for while using this medication? Your condition will be monitored carefully while you are receiving this medicine. This medicine may increase the side effects of 5-fluorouracil, 5-FU. Tell your doctor or health care professional if you have diarrhea or mouth sores that do not get better or that get worse. What side effects may I notice from receiving this medication? Side effects that you should report to your doctor or health care professional as soon as possible: allergic reactions like skin rash, itching or hives, swelling of the face, lips, or tongue breathing problems fever, infection mouth sores unusual bleeding or bruising unusually weak or tired Side effects that usually do not require medical attention (report to your doctor or health care professional if they continue or are bothersome): constipation or diarrhea loss of appetite nausea, vomiting This list may not describe all possible side effects. Call your doctor for medical advice about side effects. You may report side effects to FDA at 1-800-FDA-1088. Where should I keep my medication? This drug is given in a hospital or clinic and will not be stored at home. NOTE: This sheet is a summary. It may not cover all possible information. If you have questions about this medicine, talk to your doctor, pharmacist, or health care provider.  2023 Elsevier/Gold Standard (2008-03-29 00:00:00) Oxaliplatin Injection What is this  medication? OXALIPLATIN (ox AL i PLA tin) is a chemotherapy drug. It targets fast dividing cells, like cancer cells, and causes these cells to die. This medicine is used to treat cancers of the colon and rectum, and many other cancers. This medicine may be used for other purposes; ask your health care provider or pharmacist if you have questions. COMMON BRAND NAME(S): Eloxatin What should I tell my care team before I take this medication? They need to know if you have any of these conditions: heart disease history of irregular heartbeat liver disease low blood counts, like white cells, platelets, or red blood cells lung or breathing disease, like asthma take medicines that treat or prevent blood clots tingling of the fingers or toes,  or other nerve disorder an unusual or allergic reaction to oxaliplatin, other chemotherapy, other medicines, foods, dyes, or preservatives pregnant or trying to get pregnant breast-feeding How should I use this medication? This drug is given as an infusion into a vein. It is administered in a hospital or clinic by a specially trained health care professional. Talk to your pediatrician regarding the use of this medicine in children. Special care may be needed. Overdosage: If you think you have taken too much of this medicine contact a poison control center or emergency room at once. NOTE: This medicine is only for you. Do not share this medicine with others. What if I miss a dose? It is important not to miss a dose. Call your doctor or health care professional if you are unable to keep an appointment. What may interact with this medication? Do not take this medicine with any of the following medications: cisapride dronedarone pimozide thioridazine This medicine may also interact with the following medications: aspirin and aspirin-like medicines certain medicines that treat or prevent blood clots like warfarin, apixaban, dabigatran, and  rivaroxaban cisplatin cyclosporine diuretics medicines for infection like acyclovir, adefovir, amphotericin B, bacitracin, cidofovir, foscarnet, ganciclovir, gentamicin, pentamidine, vancomycin NSAIDs, medicines for pain and inflammation, like ibuprofen or naproxen other medicines that prolong the QT interval (an abnormal heart rhythm) pamidronate zoledronic acid This list may not describe all possible interactions. Give your health care provider a list of all the medicines, herbs, non-prescription drugs, or dietary supplements you use. Also tell them if you smoke, drink alcohol, or use illegal drugs. Some items may interact with your medicine. What should I watch for while using this medication? Your condition will be monitored carefully while you are receiving this medicine. You may need blood work done while you are taking this medicine. This medicine may make you feel generally unwell. This is not uncommon as chemotherapy can affect healthy cells as well as cancer cells. Report any side effects. Continue your course of treatment even though you feel ill unless your healthcare professional tells you to stop. This medicine can make you more sensitive to cold. Do not drink cold drinks or use ice. Cover exposed skin before coming in contact with cold temperatures or cold objects. When out in cold weather wear warm clothing and cover your mouth and nose to warm the air that goes into your lungs. Tell your doctor if you get sensitive to the cold. Do not become pregnant while taking this medicine or for 9 months after stopping it. Women should inform their health care professional if they wish to become pregnant or think they might be pregnant. Men should not father a child while taking this medicine and for 6 months after stopping it. There is potential for serious side effects to an unborn child. Talk to your health care professional for more information. Do not breast-feed a child while taking this  medicine or for 3 months after stopping it. This medicine has caused ovarian failure in some women. This medicine may make it more difficult to get pregnant. Talk to your health care professional if you are concerned about your fertility. This medicine has caused decreased sperm counts in some men. This may make it more difficult to father a child. Talk to your health care professional if you are concerned about your fertility. This medicine may increase your risk of getting an infection. Call your health care professional for advice if you get a fever, chills, or sore throat, or other symptoms of  a cold or flu. Do not treat yourself. Try to avoid being around people who are sick. Avoid taking medicines that contain aspirin, acetaminophen, ibuprofen, naproxen, or ketoprofen unless instructed by your health care professional. These medicines may hide a fever. Be careful brushing or flossing your teeth or using a toothpick because you may get an infection or bleed more easily. If you have any dental work done, tell your dentist you are receiving this medicine. What side effects may I notice from receiving this medication? Side effects that you should report to your doctor or health care professional as soon as possible: allergic reactions like skin rash, itching or hives, swelling of the face, lips, or tongue breathing problems cough low blood counts - this medicine may decrease the number of white blood cells, red blood cells, and platelets. You may be at increased risk for infections and bleeding nausea, vomiting pain, redness, or irritation at site where injected pain, tingling, numbness in the hands or feet signs and symptoms of bleeding such as bloody or black, tarry stools; red or dark brown urine; spitting up blood or brown material that looks like coffee grounds; red spots on the skin; unusual bruising or bleeding from the eyes, gums, or nose signs and symptoms of a dangerous change in  heartbeat or heart rhythm like chest pain; dizziness; fast, irregular heartbeat; palpitations; feeling faint or lightheaded; falls signs and symptoms of infection like fever; chills; cough; sore throat; pain or trouble passing urine signs and symptoms of liver injury like dark yellow or brown urine; general ill feeling or flu-like symptoms; light-colored stools; loss of appetite; nausea; right upper belly pain; unusually weak or tired; yellowing of the eyes or skin signs and symptoms of low red blood cells or anemia such as unusually weak or tired; feeling faint or lightheaded; falls signs and symptoms of muscle injury like dark urine; trouble passing urine or change in the amount of urine; unusually weak or tired; muscle pain; back pain Side effects that usually do not require medical attention (report to your doctor or health care professional if they continue or are bothersome): changes in taste diarrhea gas hair loss loss of appetite mouth sores This list may not describe all possible side effects. Call your doctor for medical advice about side effects. You may report side effects to FDA at 1-800-FDA-1088. Where should I keep my medication? This drug is given in a hospital or clinic and will not be stored at home. NOTE: This sheet is a summary. It may not cover all possible information. If you have questions about this medicine, talk to your doctor, pharmacist, or health care provider.  2023 Elsevier/Gold Standard (2021-08-22 00:00:00)

## 2022-01-22 LAB — CEA: CEA: 3.2 ng/mL (ref 0.0–4.7)

## 2022-01-23 ENCOUNTER — Other Ambulatory Visit: Payer: Self-pay | Admitting: Internal Medicine

## 2022-01-23 ENCOUNTER — Inpatient Hospital Stay: Payer: Medicare HMO

## 2022-01-23 VITALS — BP 123/63 | HR 73 | Temp 97.6°F

## 2022-01-23 DIAGNOSIS — C189 Malignant neoplasm of colon, unspecified: Secondary | ICD-10-CM

## 2022-01-23 DIAGNOSIS — Z5112 Encounter for antineoplastic immunotherapy: Secondary | ICD-10-CM | POA: Diagnosis not present

## 2022-01-23 MED ORDER — SODIUM CHLORIDE 0.9% FLUSH
10.0000 mL | INTRAVENOUS | Status: DC | PRN
  Administered 2022-01-23: 10 mL
  Filled 2022-01-23: qty 10

## 2022-01-23 MED ORDER — HEPARIN SOD (PORK) LOCK FLUSH 100 UNIT/ML IV SOLN
500.0000 [IU] | Freq: Once | INTRAVENOUS | Status: AC | PRN
  Administered 2022-01-23: 500 [IU]
  Filled 2022-01-23: qty 5

## 2022-01-23 MED ORDER — PEGFILGRASTIM-CBQV 6 MG/0.6ML ~~LOC~~ SOSY
6.0000 mg | PREFILLED_SYRINGE | Freq: Once | SUBCUTANEOUS | Status: AC
  Administered 2022-01-23: 6 mg via SUBCUTANEOUS

## 2022-01-23 NOTE — Telephone Encounter (Signed)
Requested Prescriptions  Pending Prescriptions Disp Refills  . atorvastatin (LIPITOR) 10 MG tablet [Pharmacy Med Name: ATORVASTATIN 10 MG TABLET] 90 tablet     Sig: TAKE 1 TABLET BY MOUTH EVERY DAY     Cardiovascular:  Antilipid - Statins Failed - 01/23/2022  2:55 AM      Failed - Lipid Panel in normal range within the last 12 months    Cholesterol  Date Value Ref Range Status  10/28/2021 231 (H) <200 mg/dL Final   LDL Cholesterol (Calc)  Date Value Ref Range Status  10/28/2021 152 (H) mg/dL (calc) Final    Comment:    Reference range: <100 . Desirable range <100 mg/dL for primary prevention;   <70 mg/dL for patients with CHD or diabetic patients  with > or = 2 CHD risk factors. Marland Kitchen LDL-C is now calculated using the Martin-Hopkins  calculation, which is a validated novel method providing  better accuracy than the Friedewald equation in the  estimation of LDL-C.  Cresenciano Genre et al. Annamaria Helling. 5852;778(24): 2061-2068  (http://education.[employer].com/faq/FAQ164)    Direct LDL  Date Value Ref Range Status  02/08/2013 151.3 mg/dL Final    Comment:    Optimal:  <100 mg/dLNear or Above Optimal:  100-129 mg/dLBorderline High:  130-159 mg/dLHigh:  160-189 mg/dLVery High:  >190 mg/dL   HDL  Date Value Ref Range Status  10/28/2021 64 > OR = 50 mg/dL Final   Triglycerides  Date Value Ref Range Status  10/28/2021 62 <150 mg/dL Final         Passed - Patient is not pregnant      Passed - Valid encounter within last 12 months    Recent Outpatient Visits          2 months ago Pure hypercholesterolemia   [place], Coralie Keens, NP   4 months ago 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 [facility])   [facility], Coralie Keens, NP   6 months ago [facility] [place], Coralie Keens, NP   8 months ago Cervical myelopathy [facility])   [facility], Coralie Keens, NP   10 months ago Colonic mass   [facility]  [place], Coralie Keens, NP      Future Appointments            In 5 days [place], Coralie Keens, NP [facility], [place]   In 3 months [place], Coralie Keens, NP [facility] [place], [facility]

## 2022-01-27 ENCOUNTER — Inpatient Hospital Stay: Payer: Medicare HMO

## 2022-01-27 NOTE — Progress Notes (Signed)
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  Patient receiving chemotherapy.     Spoke with patient via phone.  Patient reports that for the last several days her appetite has been decreased due to her shot.  Has eaten a banana and [place] and rice stew today so far.  Last night able to eat crackers, bologna and cheese and tangerines.  Drinks and likes ensure shakes. Denies nausea or constipation.    Medications: reviewed  Labs: reviewed  Anthropometrics:   Weight 178 lb 1.6 oz on 4/19  174 lb 6.4 oz on 3/22 178 lb on 1/11   NUTRITION DIAGNOSIS: Food and nutrition related knowledge deficit improved    INTERVENTION:  Continue with eating good sources of protein Continue ensure shakes for added nutrition Will mail coupons    MONITORING, EVALUATION, GOAL: weight trends, intake   NEXT VISIT: Tuesday, June 6 phone call  Abdirizak Richison B. Zenia Resides, [place], [place] Registered Dietitian [phone]

## 2022-01-28 ENCOUNTER — Ambulatory Visit: Payer: Medicare HMO | Admitting: Internal Medicine

## 2022-01-28 NOTE — Progress Notes (Deleted)
Subjective:    Patient ID: Chelsea [name], female    DOB: Dec 17, 1945, 76 y.o.   MRN: [id number]  HPI  Patient presents to clinic today with complaint of.  Review of Systems     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)    Moderate mitral insufficiency    Port-A-Cath in place    Seizures [facility])    Spinal stenosis    Ulcer    Urinary incontinence     Current Outpatient Medications  Medication Sig Dispense Refill   acetaminophen (TYLENOL) 500 MG tablet Take 500 mg by mouth every 6 (six) hours as needed.     ALPRAZolam (XANAX) 0.5 MG tablet TAKE 1 TABLET BY MOUTH TWICE A DAY AS NEEDED FOR ANXIETY 60 tablet 0   aspirin 325 MG tablet Take 325 mg by mouth daily.     atorvastatin (LIPITOR) 10 MG tablet TAKE 1 TABLET BY MOUTH EVERY DAY 90 tablet 0   busPIRone (BUSPAR) 5 MG tablet Take 1 tablet (5 mg total) by mouth 3 (three) times daily. 270 tablet 1   Cyanocobalamin (VITAMIN B 12 PO) Take 500 mcg by mouth daily.     diclofenac Sodium (VOLTAREN) 1 % GEL Apply 4 g topically 4 (four) times daily. 4 g 0   FLUoxetine (PROZAC) 20 MG capsule TAKE 3 CAPSULES (60 MG TOTAL) BY MOUTH EVERY MORNING. 270 capsule 0   gabapentin (NEURONTIN) 100 MG capsule TAKE 2 CAPSULES BY MOUTH 2 TIMES DAILY. 40 capsule 0   lidocaine (LIDODERM) 5 % Place 1 patch onto the skin daily. Remove & Discard patch within 12 hours or as directed by MD (Patient not taking: Reported on 12/24/2021) 1 patch 0   lidocaine-prilocaine (EMLA) cream Apply small amount of cream to port site 1-2 hour prior to chemo treatment. 30 g 3   loperamide (IMODIUM) 2 MG capsule Take 1 capsule (2 mg total) by mouth See admin instructions. Initial: 4 mg, followed by 2 mg after each loose stool; maximum: 16 mg/day (Patient not taking: Reported on 12/24/2021) 60 capsule 0   meclizine (ANTIVERT) 12.5 MG tablet Take 1 tablet  (12.5 mg total) by mouth 3 (three) times daily as needed for dizziness. 30 tablet 0   methocarbamol (ROBAXIN) 500 MG tablet TAKE 1 TABLET TWICE DAILY AS NEEDED FOR MUSCLE SPASM(S) 60 tablet 0   ondansetron (ZOFRAN) 8 MG tablet Take 1 tablet (8 mg total) by mouth 2 (two) times daily as needed (Nausea or vomiting). 30 tablet 1   polyethylene glycol powder (GLYCOLAX/MIRALAX) 17 GM/SCOOP powder Take 17 g by mouth daily. 3350 g 1   potassium chloride SA (KLOR-CON M) 20 MEQ tablet TAKE 1 TABLET EVERY DAY 90 tablet 0   prochlorperazine (COMPAZINE) 10 MG tablet Take 1 tablet (10 mg total) by mouth every 6 (six) hours as needed (Nausea or vomiting). 30 tablet 1   triamterene-hydrochlorothiazide (MAXZIDE-25) 37.5-25 MG tablet Take 1 tablet by mouth daily. 90 tablet 1   vitamin E 180 MG (400 UNITS) capsule Take 400 Units by mouth daily.     No current facility-administered medications for this visit.    Allergies  Allergen Reactions   Penicillins     "Everything turned black"    Family History  Problem Relation Age of Onset   Arthritis Mother  Heart disease Mother    Stroke Mother    Hypertension Mother    COPD Mother    Sudden death Sister    Other Father        unknown medical history   Breast cancer Neg Hx     Social History   Socioeconomic History   Marital status: Divorced    Spouse name: Not on file   Number of children: Not on file   Years of education: Not on file   Highest education level: Not on file  Occupational History   Not on file  Tobacco Use   Smoking status: Never   Smokeless tobacco: Never  Vaping Use   Vaping Use: Never used  Substance and Sexual Activity   Alcohol use: Never   Drug use: No   Sexual activity: Not Currently  Other Topics Concern   Not on file  Social History Narrative   Not on file   Social Determinants of Health   Financial Resource Strain: Low Risk    Difficulty of Paying Living Expenses: Not very hard  Food Insecurity: No Food  Insecurity   Worried About Running Out of Food in the Last Year: Never true   Ran Out of Food in the Last Year: Never true  Transportation Needs: No Transportation Needs   Lack of Transportation (Medical): No   Lack of Transportation (Non-Medical): No  Physical Activity: Inactive   Days of Exercise per Week: 0 days   Minutes of Exercise per Session: 0 min  Stress: Stress Concern Present   Feeling of Stress : To some extent  Social Connections: Moderately Integrated   Frequency of Communication with Friends and Family: Three times a week   Frequency of Social Gatherings with Friends and Family: Three times a week   Attends Religious Services: 1 to 4 times per year   Active Member of Clubs or Organizations: No   Attends Archivist Meetings: 1 to 4 times per year   Marital Status: Widowed  Human resources officer Violence: Not At Risk   Fear of Current or Ex-Partner: No   Emotionally Abused: No   Physically Abused: No   Sexually Abused: No     Constitutional: Denies fever, malaise, fatigue, headache or abrupt weight changes.  HEENT: Denies eye pain, eye redness, ear pain, ringing in the ears, wax buildup, runny nose, nasal congestion, bloody nose, or sore throat. Respiratory: Denies difficulty breathing, shortness of breath, cough or sputum production.   Cardiovascular: Denies chest pain, chest tightness, palpitations or swelling in the hands or feet.  Gastrointestinal: Denies abdominal pain, bloating, constipation, diarrhea or blood in the stool.  GU: Denies urgency, frequency, pain with urination, burning sensation, blood in urine, odor or discharge. Musculoskeletal: Patient reports low back pain.  Denies decrease in range of motion, difficulty with gait,  or joint swelling.  Skin: Denies redness, rashes, lesions or ulcercations.  Neurological: Denies dizziness, difficulty with memory, difficulty with speech or problems with balance and coordination.  Psych: Patient has a history  of anxiety and depression.  Denies SI/HI.  No other specific complaints in a complete review of systems (except as listed in HPI above).  Objective:   Physical Exam  There were no vitals taken for this visit. Wt Readings from Last 3 Encounters:  01/21/22 178 lb 1.6 oz (80.8 kg)  01/07/22 181 lb (82.1 kg)  12/31/21 177 lb (80.3 kg)    General: Appears their stated age, well developed, well nourished in NAD. Skin: Warm,  dry and intact. No rashes, lesions or ulcerations noted. HEENT: Head: normal shape and size; Eyes: sclera white, no icterus, conjunctiva pink, PERRLA and EOMs intact; Ears: Tm's gray and intact, normal light reflex; Nose: mucosa pink and moist, septum midline; Throat/Mouth: Teeth present, mucosa pink and moist, no exudate, lesions or ulcerations noted.  Neck:  Neck supple, trachea midline. No masses, lumps or thyromegaly present.  Cardiovascular: Normal rate and rhythm. S1,S2 noted.  No murmur, rubs or gallops noted. No JVD or BLE edema. No carotid bruits noted. Pulmonary/Chest: Normal effort and positive vesicular breath sounds. No respiratory distress. No wheezes, rales or ronchi noted.  Abdomen: Soft and nontender. Normal bowel sounds. No distention or masses noted. Liver, spleen and kidneys non palpable. Musculoskeletal: Normal range of motion. No signs of joint swelling. No difficulty with gait.  Neurological: Alert and oriented. Cranial nerves II-XII grossly intact. Coordination normal.  Psychiatric: Mood and affect normal. Behavior is normal. Judgment and thought content normal.   EKG:  BMET    Component Value Date/Time   NA 136 01/21/2022 0809   K 3.5 01/21/2022 0809   CL 103 01/21/2022 0809   CO2 28 01/21/2022 0809   GLUCOSE 113 (H) 01/21/2022 0809   BUN 15 01/21/2022 0809   CREATININE 0.92 01/21/2022 0809   CREATININE 0.87 08/13/2020 1129   CALCIUM 8.7 (L) 01/21/2022 0809   GFRNONAA >60 01/21/2022 0809   GFRNONAA 65 08/13/2020 1129   GFRAA 76  08/13/2020 1129    Lipid Panel     Component Value Date/Time   CHOL 231 (H) 10/28/2021 1042   TRIG 62 10/28/2021 1042   HDL 64 10/28/2021 1042   CHOLHDL 3.6 10/28/2021 1042   VLDL 18.4 02/08/2013 1638   LDLCALC 152 (H) 10/28/2021 1042    CBC    Component Value Date/Time   WBC 3.1 (L) 01/21/2022 0809   RBC 3.16 (L) 01/21/2022 0809   HGB 10.1 (L) 01/21/2022 0809   HCT 31.9 (L) 01/21/2022 0809   PLT 131 (L) 01/21/2022 0809   MCV 100.9 (H) 01/21/2022 0809   MCH 32.0 01/21/2022 0809   MCHC 31.7 01/21/2022 0809   RDW 18.7 (H) 01/21/2022 0809   LYMPHSABS 1.6 01/21/2022 0809   MONOABS 0.3 01/21/2022 0809   EOSABS 0.1 01/21/2022 0809   BASOSABS 0.0 01/21/2022 0809    Hgb A1C Lab Results  Component Value Date   HGBA1C 5.6 10/28/2021            Assessment & Plan:   RTC in 2 months for follow-up of chronic conditions Webb Silversmith, NP

## 2022-01-30 ENCOUNTER — Other Ambulatory Visit: Payer: Self-pay | Admitting: Internal Medicine

## 2022-01-30 DIAGNOSIS — I10 Essential (primary) hypertension: Secondary | ICD-10-CM

## 2022-01-30 DIAGNOSIS — R600 Localized edema: Secondary | ICD-10-CM

## 2022-01-30 NOTE — Telephone Encounter (Signed)
Change of pharmacy Requested Prescriptions  Pending Prescriptions Disp Refills  . triamterene-hydrochlorothiazide (MAXZIDE-25) 37.5-25 MG tablet [Pharmacy Med Name: TRIAMTERENE-HCTZ 37.5-25 MG TB] 90 tablet 0    Sig: TAKE 1 TABLET BY MOUTH EVERY DAY     Cardiovascular: Diuretic Combos Passed - 01/30/2022  2:31 AM      Passed - K in normal range and within 180 days    Potassium  Date Value Ref Range Status  01/21/2022 3.5 3.5 - 5.1 mmol/L Final         Passed - Na in normal range and within 180 days    Sodium  Date Value Ref Range Status  01/21/2022 136 135 - 145 mmol/L Final         Passed - Cr in normal range and within 180 days    Creat  Date Value Ref Range Status  08/13/2020 0.87 0.60 - 0.93 mg/dL Final    Comment:    For patients >37 years of age, the reference limit for Creatinine is approximately 13% higher for people identified as African-American. .    Creatinine, Ser  Date Value Ref Range Status  01/21/2022 0.92 0.44 - 1.00 mg/dL Final         Passed - Last BP in normal range    BP Readings from Last 1 Encounters:  01/23/22 123/63         Passed - Valid encounter within last 6 months    Recent Outpatient Visits          3 months ago Pure hypercholesterolemia   [place], Coralie Keens, NP   4 months ago 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 [facility])   [facility], Coralie Keens, NP   6 months ago [facility] [place], Coralie Keens, NP   8 months ago [facility]ervical myelopathy [facility])   [facility], Coralie Keens, NP   10 months ago Colonic mass   [facility] [place], Coralie Keens, NP      Future Appointments            In 2 months [place], Coralie Keens, NP [facility], [facility]

## 2022-02-01 ENCOUNTER — Other Ambulatory Visit: Payer: Self-pay | Admitting: Oncology

## 2022-02-01 ENCOUNTER — Other Ambulatory Visit: Payer: Self-pay | Admitting: Internal Medicine

## 2022-02-01 DIAGNOSIS — F411 Generalized anxiety disorder: Secondary | ICD-10-CM

## 2022-02-03 NOTE — Telephone Encounter (Signed)
Requested Prescriptions  Pending Prescriptions Disp Refills  . ALPRAZolam (XANAX) 0.5 MG tablet [Pharmacy Med Name: ALPRAZOLAM 0.5 MG TABLET] 60 tablet 0    Sig: TAKE 1 TABLET BY MOUTH TWICE A DAY AS NEEDED FOR ANXIETY     Not Delegated - Psychiatry: Anxiolytics/Hypnotics 2 Failed - 02/01/2022  4:14 PM      Failed - This refill cannot be delegated      Failed - Urine Drug Screen completed in last 360 days      Passed - Patient is not pregnant      Passed - Valid encounter within last 6 months    Recent Outpatient Visits          3 months ago Pure hypercholesterolemia   [place], Coralie Keens, NP   4 months ago 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 [facility])   [facility] [place], Coralie Keens, NP   6 months ago [facility] [place], Coralie Keens, NP   8 months ago Cervical myelopathy [facility])   [facility], Coralie Keens, NP   10 months ago Colonic mass   [facility] [place], Coralie Keens, NP      Future Appointments            In 2 months [place], Coralie Keens, NP [facility] [place]  . atorvastatin (LIPITOR) 10 MG tablet [Pharmacy Med Name: ATORVASTATIN 10 MG TABLET] 90 tablet 0    Sig: TAKE 1 TABLET BY MOUTH EVERY DAY     Cardiovascular:  Antilipid - Statins Failed - 02/01/2022  4:14 PM      Failed - Lipid Panel in normal range within the last 12 months    Cholesterol  Date Value Ref Range Status  10/28/2021 231 (H) <200 mg/dL Final   LDL Cholesterol (Calc)  Date Value Ref Range Status  10/28/2021 152 (H) mg/dL (calc) Final    Comment:    Reference range: <100 . Desirable range <100 mg/dL for primary prevention;   <70 mg/dL for patients with CHD or diabetic patients  with > or = 2 CHD risk factors. Marland Kitchen LDL-C is now calculated using the Martin-Hopkins  calculation, which is a validated novel method providing  better accuracy than the  Friedewald equation in the  estimation of LDL-C.  Cresenciano Genre et al. Annamaria Helling. 0086;761(95): 2061-2068  (http://education.[facility].com/faq/FAQ164)    Direct LDL  Date Value Ref Range Status  02/08/2013 151.3 mg/dL Final    Comment:    Optimal:  <100 mg/dLNear or Above Optimal:  100-129 mg/dLBorderline High:  130-159 mg/dLHigh:  160-189 mg/dLVery High:  >190 mg/dL   HDL  Date Value Ref Range Status  10/28/2021 64 > OR = 50 mg/dL Final   Triglycerides  Date Value Ref Range Status  10/28/2021 62 <150 mg/dL Final         Passed - Patient is not pregnant      Passed - Valid encounter within last 12 months    Recent Outpatient Visits          3 months ago Pure hypercholesterolemia   [place], Coralie Keens, NP   4 months ago 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 [facility])   [facility], Coralie Keens, NP   6 months ago [facility] [place], Coralie Keens, [place]  8 months ago Cervical myelopathy [facility])   [facility] [place], Coralie Keens, NP   10 months ago Colonic mass   [facility] [place], Coralie Keens, NP      Future Appointments            In 2 months [place], Coralie Keens, NP [facility] [place] via Interface requested early

## 2022-02-03 NOTE — Telephone Encounter (Signed)
Requested medication (s) are due for refill today: yes  Requested medication (s) are on the active medication list: yes    Last refill: 12/31/21  #60  0 refills  Future visit scheduled no  Notes to clinic:Not delegated, please review.  Requested Prescriptions  Pending Prescriptions Disp Refills   ALPRAZolam (XANAX) 0.5 MG tablet [Pharmacy Med Name: ALPRAZOLAM 0.5 MG TABLET] 60 tablet 0    Sig: TAKE 1 TABLET BY MOUTH TWICE A DAY AS NEEDED FOR ANXIETY     Not Delegated - Psychiatry: Anxiolytics/Hypnotics 2 Failed - 02/01/2022  4:14 PM      Failed - This refill cannot be delegated      Failed - Urine Drug Screen completed in last 360 days      Passed - Patient is not pregnant      Passed - Valid encounter within last 6 months    Recent Outpatient Visits           3 months ago Pure hypercholesterolemia   [place], Coralie Keens, NP   4 months ago 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 [facility])   [facility], Coralie Keens, NP   6 months ago [facility] [place], Coralie Keens, NP   8 months ago Cervical myelopathy [facility])   [facility], Coralie Keens, NP   10 months ago Colonic mass   [facility] [place], Coralie Keens, NP       Future Appointments             In 2 months [place], Coralie Keens, NP [facility]             Refused Prescriptions Disp Refills   atorvastatin (LIPITOR) 10 MG tablet [Pharmacy Med Name: ATORVASTATIN 10 MG TABLET] 90 tablet 0    Sig: TAKE 1 TABLET BY MOUTH EVERY DAY     Cardiovascular:  Antilipid - Statins Failed - 02/01/2022  4:14 PM      Failed - Lipid Panel in normal range within the last 12 months    Cholesterol  Date Value Ref Range Status  10/28/2021 231 (H) <200 mg/dL Final   LDL Cholesterol (Calc)  Date Value Ref Range Status  10/28/2021 152 (H) mg/dL (calc) Final    Comment:    Reference range: <100 . Desirable range <100 mg/dL for  primary prevention;   <70 mg/dL for patients with CHD or diabetic patients  with > or = 2 CHD risk factors. Marland Kitchen LDL-C is now calculated using the Martin-Hopkins  calculation, which is a validated novel method providing  better accuracy than the Friedewald equation in the  estimation of LDL-C.  Cresenciano Genre et al. Annamaria Helling. 0938;182(99): 2061-2068  (http://education.[facility].com/faq/FAQ164)    Direct LDL  Date Value Ref Range Status  02/08/2013 151.3 mg/dL Final    Comment:    Optimal:  <100 mg/dLNear or Above Optimal:  100-129 mg/dLBorderline High:  130-159 mg/dLHigh:  160-189 mg/dLVery High:  >190 mg/dL   HDL  Date Value Ref Range Status  10/28/2021 64 > OR = 50 mg/dL Final   Triglycerides  Date Value Ref Range Status  10/28/2021 62 <150 mg/dL Final         Passed - Patient is not pregnant      Passed - Valid encounter within last 12 months    Recent Outpatient Visits           3 months ago Pure  hypercholesterolemia   [facility] [place], Coralie Keens, NP   4 months ago 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 [facility])   [facility] [place], Coralie Keens, NP   6 months ago [facility] [place], Coralie Keens, NP   8 months ago Cervical myelopathy [facility])   [facility], NP   10 months ago Colonic mass   [facility] [place], Coralie Keens, NP       Future Appointments             In 2 months [place], Coralie Keens, NP [facility], [facility]

## 2022-02-04 ENCOUNTER — Inpatient Hospital Stay: Payer: Medicare HMO | Attending: Oncology

## 2022-02-04 ENCOUNTER — Inpatient Hospital Stay: Payer: Medicare HMO

## 2022-02-04 ENCOUNTER — Inpatient Hospital Stay (HOSPITAL_BASED_OUTPATIENT_CLINIC_OR_DEPARTMENT_OTHER): Payer: Medicare HMO | Admitting: Oncology

## 2022-02-04 ENCOUNTER — Encounter: Payer: Self-pay | Admitting: Oncology

## 2022-02-04 VITALS — BP 137/81 | HR 72 | Temp 97.5°F | Resp 18 | Wt 174.5 lb

## 2022-02-04 DIAGNOSIS — Z5111 Encounter for antineoplastic chemotherapy: Secondary | ICD-10-CM | POA: Diagnosis not present

## 2022-02-04 DIAGNOSIS — C787 Secondary malignant neoplasm of liver and intrahepatic bile duct: Secondary | ICD-10-CM

## 2022-02-04 DIAGNOSIS — Z5112 Encounter for antineoplastic immunotherapy: Secondary | ICD-10-CM | POA: Diagnosis not present

## 2022-02-04 DIAGNOSIS — C189 Malignant neoplasm of colon, unspecified: Secondary | ICD-10-CM | POA: Diagnosis not present

## 2022-02-04 DIAGNOSIS — R634 Abnormal weight loss: Secondary | ICD-10-CM

## 2022-02-04 DIAGNOSIS — D701 Agranulocytosis secondary to cancer chemotherapy: Secondary | ICD-10-CM | POA: Insufficient documentation

## 2022-02-04 DIAGNOSIS — E876 Hypokalemia: Secondary | ICD-10-CM | POA: Diagnosis not present

## 2022-02-04 DIAGNOSIS — M25511 Pain in right shoulder: Secondary | ICD-10-CM | POA: Insufficient documentation

## 2022-02-04 DIAGNOSIS — G629 Polyneuropathy, unspecified: Secondary | ICD-10-CM | POA: Diagnosis not present

## 2022-02-04 DIAGNOSIS — Z79899 Other long term (current) drug therapy: Secondary | ICD-10-CM | POA: Diagnosis not present

## 2022-02-04 LAB — COMPREHENSIVE METABOLIC PANEL
ALT: 13 U/L (ref 0–44)
AST: 21 U/L (ref 15–41)
Albumin: 3.5 g/dL (ref 3.5–5.0)
Alkaline Phosphatase: 103 U/L (ref 38–126)
Anion gap: 8 (ref 5–15)
BUN: 16 mg/dL (ref 8–23)
CO2: 28 mmol/L (ref 22–32)
Calcium: 9.1 mg/dL (ref 8.9–10.3)
Chloride: 101 mmol/L (ref 98–111)
Creatinine, Ser: 1.02 mg/dL — ABNORMAL HIGH (ref 0.44–1.00)
GFR, Estimated: 57 mL/min — ABNORMAL LOW (ref >60)
Glucose, Bld: 112 mg/dL — ABNORMAL HIGH (ref 70–99)
Potassium: 3.4 mmol/L — ABNORMAL LOW (ref 3.5–5.1)
Sodium: 137 mmol/L (ref 135–145)
Total Bilirubin: 0.6 mg/dL (ref 0.3–1.2)
Total Protein: 7.4 g/dL (ref 6.5–8.1)

## 2022-02-04 LAB — CBC WITH DIFFERENTIAL/PLATELET
Abs Immature Granulocytes: 0.32 10*3/uL — ABNORMAL HIGH (ref 0.00–0.07)
Basophils Absolute: 0.1 10*3/uL (ref 0.0–0.1)
Basophils Relative: 1 %
Eosinophils Absolute: 0.1 10*3/uL (ref 0.0–0.5)
Eosinophils Relative: 1 %
HCT: 33.3 % — ABNORMAL LOW (ref 36.0–46.0)
Hemoglobin: 10.5 g/dL — ABNORMAL LOW (ref 12.0–15.0)
Immature Granulocytes: 3 %
Lymphocytes Relative: 22 %
Lymphs Abs: 2.4 10*3/uL (ref 0.7–4.0)
MCH: 31.9 pg (ref 26.0–34.0)
MCHC: 31.5 g/dL (ref 30.0–36.0)
MCV: 101.2 fL — ABNORMAL HIGH (ref 80.0–100.0)
Monocytes Absolute: 0.8 10*3/uL (ref 0.1–1.0)
Monocytes Relative: 7 %
Neutro Abs: 7.5 10*3/uL (ref 1.7–7.7)
Neutrophils Relative %: 66 %
Platelets: 106 10*3/uL — ABNORMAL LOW (ref 150–400)
RBC: 3.29 MIL/uL — ABNORMAL LOW (ref 3.87–5.11)
RDW: 20 % — ABNORMAL HIGH (ref 11.5–15.5)
WBC: 11.2 10*3/uL — ABNORMAL HIGH (ref 4.0–10.5)
nRBC: 0.6 % — ABNORMAL HIGH (ref 0.0–0.2)

## 2022-02-04 LAB — PROTEIN, URINE, RANDOM: Total Protein, Urine: 7 mg/dL

## 2022-02-04 MED ORDER — SODIUM CHLORIDE 0.9 % IV SOLN
2400.0000 mg/m2 | INTRAVENOUS | Status: DC
  Administered 2022-02-04: 4300 mg via INTRAVENOUS
  Filled 2022-02-04: qty 86

## 2022-02-04 MED ORDER — LEUCOVORIN CALCIUM INJECTION 350 MG
700.0000 mg | Freq: Once | INTRAVENOUS | Status: AC
  Administered 2022-02-04: 700 mg via INTRAVENOUS
  Filled 2022-02-04: qty 35

## 2022-02-04 MED ORDER — ACETAMINOPHEN 325 MG PO TABS
650.0000 mg | ORAL_TABLET | Freq: Once | ORAL | Status: AC
  Administered 2022-02-04: 650 mg via ORAL
  Filled 2022-02-04: qty 2

## 2022-02-04 MED ORDER — PALONOSETRON HCL INJECTION 0.25 MG/5ML
0.2500 mg | Freq: Once | INTRAVENOUS | Status: AC
  Administered 2022-02-04: 0.25 mg via INTRAVENOUS
  Filled 2022-02-04: qty 5

## 2022-02-04 MED ORDER — PROCHLORPERAZINE MALEATE 10 MG PO TABS
10.0000 mg | ORAL_TABLET | Freq: Once | ORAL | Status: AC
  Administered 2022-02-04: 10 mg via ORAL
  Filled 2022-02-04: qty 1

## 2022-02-04 MED ORDER — SODIUM CHLORIDE 0.9 % IV SOLN
5.0000 mg/kg | Freq: Once | INTRAVENOUS | Status: AC
  Administered 2022-02-04: 400 mg via INTRAVENOUS
  Filled 2022-02-04: qty 16

## 2022-02-04 MED ORDER — OXALIPLATIN CHEMO INJECTION 100 MG/20ML
65.0000 mg/m2 | Freq: Once | INTRAVENOUS | Status: AC
  Administered 2022-02-04: 120 mg via INTRAVENOUS
  Filled 2022-02-04: qty 20

## 2022-02-04 MED ORDER — SODIUM CHLORIDE 0.9 % IV SOLN
700.0000 mg | Freq: Once | INTRAVENOUS | Status: DC
  Administered 2022-02-04: 700 mg via INTRAVENOUS
  Filled 2022-02-04: qty 35

## 2022-02-04 MED ORDER — SODIUM CHLORIDE 0.9 % IV SOLN
Freq: Once | INTRAVENOUS | Status: AC
  Filled 2022-02-04: qty 250

## 2022-02-04 MED ORDER — LORATADINE 10 MG PO TABS: 10.0000 mg | ORAL_TABLET | ORAL | 2 refills | Status: DC

## 2022-02-04 MED ORDER — DEXTROSE 5 % IV SOLN
Freq: Once | INTRAVENOUS | Status: AC
  Filled 2022-02-04: qty 250

## 2022-02-04 NOTE — Progress Notes (Signed)
Urine protein results pending. Per Benjamine Mola RN per Dr. Tasia Catchings wait for results prior to staring Zirabev, okay to begin pre-meds and Oxaliplatin.

## 2022-02-04 NOTE — Progress Notes (Signed)
Hematology/Oncology Progress note [phone] [phone]         Patient Care Team: Jearld Fenton, NP as PCP - General (Internal Medicine) Clent Jacks, RN as Oncology Nurse Navigator [name], MD as Consulting Physician (Oncology)  REFERRING PROVIDER: Jearld Fenton, NP  CHIEF COMPLAINTS/REASON FOR VISIT:  metastatic colon cancer  HISTORY OF PRESENTING ILLNESS:   Chelsea [name] is a  76 y.o.  female with PMH listed below was seen in consultation at the request of  Jearld Fenton, NP  for evaluation of metastatic colon cancer.  Patient initially went to emergency room on 03/24/2021 for abdominal pain. 03/24/2021, CT scan of the abdomen showed marked bladder wall thickening with surrounding soft tissue stranding is noted.  Concerning for cystitis.  There is a lobulated mass between the posterior wall of the bladder and the rectum which appears to arise from the vaginal cuff.  This is indeterminate and difficult to characterize reflecting lack of IV contrast material.  Nonobstructing left renal calculus, left sacroiliitis, lumbar spondylosis aortic atherosclerosis. 03/24/2021 CT pelvic showed abdominal [place] soft tissue of right cecum, concerning for colon carcinoma.  Colonoscopy recommended.  Low-attenuation mass in the region of vaginal cuff is again noted.  Diffuse urinary bladder wall thickening and mild bilateral hydroureter possibly cystitis.  [place] 03/24/2021 Lobulated solid 3.8 cm mass confirmed by ultrasound. This is most compatible with a neoplasm but remains indeterminate as to the origin as the epicenter seems to be outside of both the vaginal cuff and the adjacent colon, while the lesion appears inseparable from both. Extensive echogenic debris within the distended urinary bladder.   03/26/2021, patient was seen by Dr. Theora Gianotti for evaluation of colonic/vaginal cuff pelvic mass.  Patient also was referred to gastroenterology for  colonoscopy.  03/26/2021 CEA 3.4.  CA125 9.7 04/04/2021, status post colonoscopy which showed a frond like /villous nonobstructing large mass was found in the cecum, this was biopsied.  Terminal ileum was briefly intubated and appeared normal.  Diverticulosis in the sigmoid colon.  The rectum, sigmoid colon, descending colon, transverse colon and ascending colon were normal.  Nonbleeding internal hemorrhoids. 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    Given that the biopsy may not be representative of the entire underlying lesion.  Patient was recommended to establish with surgeon for evaluation. Patient no showed for her surgery appointment and as well as her follow-up appointment with gastroenterology.  Per daughter, patient was having cervical spine surgery and she prioritized that over the colon surgery.  08/19/2021, patient establish care with surgery Dr. Christian Mate 08/21/2021, CT abdomen pelvis with contrast  showed soft tissue mass at the base of cecum measuring up to 5 cm, slightly increased in size.  Interval development of multiple low-density lesions throughout the liver highly suggestive for metastatic disease.  Complex mass in the region of the vaginal cuff measuring up to 5.2 cm, increased in size.  Right ovary also appears more prominent on the current examination.  Moderate large volume of stool throughout the colon.  Mild urinary bladder wall thickening.  Colonic diverticulosis without evidence of active diverticulitis.  Aortic atherosclerosis  09/04/2021, patient is status post left lobe liver lesion biopsy and pathology showed metastatic adenocarcinoma, compatible with colorectal primary.  She was referred to establish care with oncology. She was accompanied by her daughter today.  Patient walks with a walker.+ Night sweats + weight loss.  Denies any abdominal pain currently, melena, blood in the stool.  She is  currently being followed by home health  PT/OT following a previous spinal surgery  10/10/2020, status post Mediport placement. 10/20/2020, cycle one 5-FU/bevacizumab.  Foundation one liquid biopsy testing showed KRASG12V, APC TP53 TMB 4  12/22/2021 CT chest abdomen pelvis Decreased cecum mass, however increased size and size of metastatic lesions throughout the liver.  Interval enlargement of vaginal cuff mass. No mets in chest.   # During the interval, patient was seen by gynecology oncology Dr. Fransisca Connors.  Biopsy of the vaginal mass was not recommended. Patient's case was also discussed on multidisciplinary tumor board.  IR potentially can try a biopsy if patient is willing to. I had a discussion with patient's daughter over the phone prior to this visit.  We discussed about option of adding oxaliplatin to 5-FU/bevacizumab and continue treatment of colon cancer, repeat CT scan short-term and if progression, repeat biopsy versus proceeding with vaginal mass biopsy.  Daughter prefers proceeding with chemotherapy and defer biopsy.   01/07/22, FOLFOX/bev  01/08/2022 patient has had a second opinion at [facility] and was seen by Dr. Reynaldo Minium .  Dr. Reynaldo Minium agrees with the current treatment plan with FOLFOX/bevacizumab.  He has ordered guardant 360 to see if she may be eligible for clinical trial in the future.     INTERVAL HISTORY Chelsea [name] is a 76 y.o. female who has above history reviewed by me today presents for follow up visit for management of metastatic colon cancer [name].  Overall patient reports feeling well.  She tolerates chemotherapy so far.  Occasionally she has loose bowel meant.  Unclear if this secondary to laxative use.  Patient does not have bowel movement every day and has been using MiraLAX and Dulcolax as needed  Chronic neuropathy, primarily numbness of fingertips, patient is on gabapentin.  Denies any burning sensation or tingling.  She has lost 4 pounds since last  visit.  Review of Systems  Constitutional:  Positive for fatigue. Negative for appetite change, chills, fever and unexpected weight change.  HENT:   Negative for hearing loss and voice change.   Eyes:  Negative for eye problems.  Respiratory:  Negative for chest tightness and cough.   Cardiovascular:  Negative for chest pain.  Gastrointestinal:  Negative for abdominal distention, abdominal pain, blood in stool and diarrhea.  Endocrine: Negative for hot flashes.  Genitourinary:  Negative for difficulty urinating and frequency.   Musculoskeletal:  Positive for back pain. Negative for arthralgias.  Skin:  Negative for itching and rash.  Neurological:  Positive for numbness (chornic finger tips). Negative for extremity weakness.  Hematological:  Negative for adenopathy.  Psychiatric/Behavioral:  Negative for confusion.    MEDICAL HISTORY:  Past Medical History:  Diagnosis Date   Anxiety    Arthritis    Depression    Hyperlipidemia    Hypertension    Liver mass    Metastatic colon cancer to liver ([place])    Moderate mitral insufficiency    Port-A-Cath in place    Seizures [facility])    Spinal stenosis    Ulcer    Urinary incontinence     SURGICAL HISTORY: Past Surgical History:  Procedure Laterality Date   ABDOMINAL HYSTERECTOMY     BACK SURGERY     due to polio   BREAST BIOPSY Bilateral    neg   BREAST BIOPSY Right 2011   neg/stereo   CARPAL TUNNEL RELEASE     CATARACT EXTRACTION Right 2020   COLONOSCOPY WITH PROPOFOL N/A  04/04/2021   Procedure: COLONOSCOPY WITH PROPOFOL;  Surgeon: Virgel Manifold, MD;  Location: [facility] ENDOSCOPY;  Service: Endoscopy;  Laterality: N/A;   EYE SURGERY     IR IMAGING GUIDED PORT INSERTION  10/09/2021    SOCIAL HISTORY: Social History   Socioeconomic History   Marital status: Divorced    Spouse name: Not on file   Number of children: Not on file   Years of education: Not on file   Highest education level: Not on file  Occupational  History   Not on file  Tobacco Use   Smoking status: Never   Smokeless tobacco: Never  Vaping Use   Vaping Use: Never used  Substance and Sexual Activity   Alcohol use: Never   Drug use: No   Sexual activity: Not Currently  Other Topics Concern   Not on file  Social History Narrative   Not on file   Social Determinants of Health   Financial Resource Strain: Low Risk    Difficulty of Paying Living Expenses: Not very hard  Food Insecurity: No Food Insecurity   Worried About Running Out of Food in the Last Year: Never true   Ran Out of Food in the Last Year: Never true  Transportation Needs: No Transportation Needs   Lack of Transportation (Medical): No   Lack of Transportation (Non-Medical): No  Physical Activity: Inactive   Days of Exercise per Week: 0 days   Minutes of Exercise per Session: 0 min  Stress: Stress Concern Present   Feeling of Stress : To some extent  Social Connections: Moderately Integrated   Frequency of Communication with Friends and Family: Three times a week   Frequency of Social Gatherings with Friends and Family: Three times a week   Attends Religious Services: 1 to 4 times per year   Active Member of Clubs or Organizations: No   Attends Archivist Meetings: 1 to 4 times per year   Marital Status: Widowed  Human resources officer Violence: Not At Risk   Fear of Current or Ex-Partner: No   Emotionally Abused: No   Physically Abused: No   Sexually Abused: No    FAMILY HISTORY: Family History  Problem Relation Age of Onset   Arthritis Mother    Heart disease Mother    Stroke Mother    Hypertension Mother    COPD Mother    Sudden death Sister    Other Father        unknown medical history   Breast cancer Neg Hx     ALLERGIES:  is allergic to penicillins.  MEDICATIONS:  Current Outpatient Medications  Medication Sig Dispense Refill   acetaminophen (TYLENOL) 500 MG tablet Take 500 mg by mouth every 6 (six) hours as needed.      ALPRAZolam (XANAX) 0.5 MG tablet TAKE 1 TABLET BY MOUTH TWICE A DAY AS NEEDED FOR ANXIETY 60 tablet 0   aspirin 325 MG tablet Take 325 mg by mouth daily.     atorvastatin (LIPITOR) 10 MG tablet TAKE 1 TABLET BY MOUTH EVERY DAY 90 tablet 0   busPIRone (BUSPAR) 5 MG tablet Take 1 tablet (5 mg total) by mouth 3 (three) times daily. 270 tablet 1   Cyanocobalamin (VITAMIN B 12 PO) Take 500 mcg by mouth daily.     diclofenac Sodium (VOLTAREN) 1 % GEL Apply 4 g topically 4 (four) times daily. 4 g 0   FLUoxetine (PROZAC) 20 MG capsule TAKE 3 CAPSULES (60 MG TOTAL) BY MOUTH EVERY  MORNING. 270 capsule 0   gabapentin (NEURONTIN) 100 MG capsule TAKE 2 CAPSULES BY MOUTH 2 TIMES DAILY. 40 capsule 0   lidocaine-prilocaine (EMLA) cream Apply small amount of cream to port site 1-2 hour prior to chemo treatment. 30 g 3   loperamide (IMODIUM) 2 MG capsule Take 1 capsule (2 mg total) by mouth See admin instructions. Initial: 4 mg, followed by 2 mg after each loose stool; maximum: 16 mg/day 60 capsule 0   loratadine (CLARITIN) 10 MG tablet Take 1 tablet (10 mg total) by mouth See admin instructions. Take 1 tablet daily for 4 days after each chemotherapy treatments. 30 tablet 2   meclizine (ANTIVERT) 12.5 MG tablet Take 1 tablet (12.5 mg total) by mouth 3 (three) times daily as needed for dizziness. 30 tablet 0   methocarbamol (ROBAXIN) 500 MG tablet TAKE 1 TABLET TWICE DAILY AS NEEDED FOR MUSCLE SPASM(S) 60 tablet 0   ondansetron (ZOFRAN) 8 MG tablet Take 1 tablet (8 mg total) by mouth 2 (two) times daily as needed (Nausea or vomiting). 30 tablet 1   polyethylene glycol powder (GLYCOLAX/MIRALAX) 17 GM/SCOOP powder Take 17 g by mouth daily. 3350 g 1   potassium chloride SA (KLOR-CON M) 20 MEQ tablet TAKE 1 TABLET EVERY DAY 90 tablet 0   prochlorperazine (COMPAZINE) 10 MG tablet Take 1 tablet (10 mg total) by mouth every 6 (six) hours as needed (Nausea or vomiting). 30 tablet 1   triamterene-hydrochlorothiazide  (MAXZIDE-25) 37.5-25 MG tablet TAKE 1 TABLET BY MOUTH EVERY DAY 90 tablet 0   vitamin E 180 MG (400 UNITS) capsule Take 400 Units by mouth daily.     lidocaine (LIDODERM) 5 % Place 1 patch onto the skin daily. Remove & Discard patch within 12 hours or as directed by MD (Patient not taking: Reported on 12/24/2021) 1 patch 0   No current facility-administered medications for this visit.   Facility-Administered Medications Ordered in Other Visits  Medication Dose Route Frequency Provider Last Rate Last Admin   fluorouracil (ADRUCIL) 4,300 mg in sodium chloride 0.9 % 64 mL chemo infusion  2,400 mg/m2 (Order-Specific) Intravenous 1 day or 1 dose [name], MD   4,300 mg at 02/04/22 1506     PHYSICAL EXAMINATION: ECOG PERFORMANCE STATUS: 2 - Symptomatic, <50% confined to bed Vitals:   02/04/22 1040  BP: 137/81  Pulse: 72  Resp: 18  Temp: (!) 97.5 F (36.4 C)   Filed Weights   02/04/22 1040  Weight: 174 lb 8 oz (79.2 kg)     Physical Exam Constitutional:      General: She is not in acute distress.    Comments: Patient sits in wheelchair  HENT:     Head: Normocephalic and atraumatic.  Eyes:     General: No scleral icterus. Cardiovascular:     Rate and Rhythm: Normal rate and regular rhythm.     Heart sounds: Normal heart sounds.  Pulmonary:     Effort: Pulmonary effort is normal. No respiratory distress.     Breath sounds: No wheezing.  Abdominal:     General: Bowel sounds are normal. There is no distension.     Palpations: Abdomen is soft.  Musculoskeletal:        General: No deformity. Normal range of motion.     Cervical back: Normal range of motion and neck supple.  Skin:    General: Skin is warm and dry.     Findings: No erythema or rash.  Neurological:  Mental Status: She is alert and oriented to person, place, and time. Mental status is at baseline.     Cranial Nerves: No cranial nerve deficit.     Coordination: Coordination normal.  Psychiatric:        Mood  and Affect: Mood normal.    LABORATORY DATA:  I have reviewed the data as listed Lab Results  Component Value Date   WBC 11.2 (H) 02/04/2022   HGB 10.5 (L) 02/04/2022   HCT 33.3 (L) 02/04/2022   MCV 101.2 (H) 02/04/2022   PLT 106 (L) 02/04/2022   Recent Labs    01/07/22 0809 01/21/22 0809 02/04/22 1012  NA 136 136 137  K 3.4* 3.5 3.4*  CL 104 103 101  CO2 28 28 28   GLUCOSE 127* 113* 112*  BUN 18 15 16   CREATININE 0.97 0.92 1.02*  CALCIUM 8.8* 8.7* 9.1  GFRNONAA >60 >60 57*  PROT 7.1 6.9 7.4  ALBUMIN 3.6 3.3* 3.5  AST 20 18 21   ALT 16 10 13   ALKPHOS 64 65 103  BILITOT 0.5 0.6 0.6    Iron/TIBC/Ferritin/ %Sat    Component Value Date/Time   FERRITIN 58 10/24/2020 0402       RADIOGRAPHIC STUDIES: I have personally reviewed the radiological images as listed and agreed with the findings in the report. No results found.    ASSESSMENT & PLAN:  1. Metastatic colon cancer to liver ([place])   2. Encounter for antineoplastic chemotherapy   3. Neuropathy   4. Weight loss   5. Hypokalemia    Cancer Staging  Metastatic colon cancer to liver [facility]) Staging form: Colon and Rectum - Neuroendocine Tumors, AJCC 8th Edition - Clinical stage from 09/16/2021: Stage IV (cTX, cNX, cM1) - Signed by [name], MD on 09/16/2021  #Metastatic colon cancer to liver, 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  No sufficient tissue for NGS Liquid biopsy showed KRAS12V, APC, TP53 mutation, TMB 4.  S/p 6 cycles of 5-FU /Bevacizumab--> CT showed mixed response--> FOLFOX/bevacizumab.  Labs reviewed and discussed with patient Proceed with cycle 3 FOLFOX bevacizumab. Plan to repeat scans after 4 cycles of treatments.  #Chemotherapy-induced neutropenia, patient gets prophylactic G-CSF with Udenyca. Recommend Claritin 10 mg daily for 4 days.  She may take Tylenol if she experiences bone pain.  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  Shared decision was made to defer  biopsy currently, proceed with treatment for metastatic colon cancer and repeat short-term CT for follow-up.  #Hypokalemia, 3.4, stable.  Continue potassium chloride 72mq daily.   #Weight loss, continue follow-up with nutritionist.   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  Stable.  Continue gabapentin  #Her creatinine levels fluctuates, recommend patient to increase oral hydration. . Supportive care measures are necessary for patient well-being and will be provided as necessary. \All questions were answered. The patient knows to call the clinic with any problems questions or concerns.  cc BJearld Fenton NP   Follow-up lab MD 2 weeks FOLFOX/bevacizumab.  [name], PhD 02/04/2022

## 2022-02-05 LAB — CEA: CEA: 2.8 ng/mL (ref 0.0–4.7)

## 2022-02-06 ENCOUNTER — Inpatient Hospital Stay: Payer: Medicare HMO

## 2022-02-06 VITALS — BP 106/63 | HR 87 | Temp 95.6°F | Resp 20

## 2022-02-06 DIAGNOSIS — Z79899 Other long term (current) drug therapy: Secondary | ICD-10-CM | POA: Diagnosis not present

## 2022-02-06 DIAGNOSIS — Z5112 Encounter for antineoplastic immunotherapy: Secondary | ICD-10-CM | POA: Diagnosis not present

## 2022-02-06 DIAGNOSIS — M25511 Pain in right shoulder: Secondary | ICD-10-CM | POA: Diagnosis not present

## 2022-02-06 DIAGNOSIS — C189 Malignant neoplasm of colon, unspecified: Secondary | ICD-10-CM | POA: Diagnosis not present

## 2022-02-06 DIAGNOSIS — Z5111 Encounter for antineoplastic chemotherapy: Secondary | ICD-10-CM | POA: Diagnosis not present

## 2022-02-06 DIAGNOSIS — C787 Secondary malignant neoplasm of liver and intrahepatic bile duct: Secondary | ICD-10-CM | POA: Diagnosis not present

## 2022-02-06 DIAGNOSIS — E876 Hypokalemia: Secondary | ICD-10-CM | POA: Diagnosis not present

## 2022-02-06 DIAGNOSIS — D701 Agranulocytosis secondary to cancer chemotherapy: Secondary | ICD-10-CM | POA: Diagnosis not present

## 2022-02-06 MED ORDER — SODIUM CHLORIDE 0.9% FLUSH
10.0000 mL | INTRAVENOUS | Status: DC | PRN
  Administered 2022-02-06: 10 mL
  Filled 2022-02-06: qty 10

## 2022-02-06 MED ORDER — HEPARIN SOD (PORK) LOCK FLUSH 100 UNIT/ML IV SOLN
500.0000 [IU] | Freq: Once | INTRAVENOUS | Status: AC | PRN
  Administered 2022-02-06: 500 [IU]
  Filled 2022-02-06: qty 5

## 2022-02-06 MED ORDER — PEGFILGRASTIM-CBQV 6 MG/0.6ML ~~LOC~~ SOSY
6.0000 mg | PREFILLED_SYRINGE | Freq: Once | SUBCUTANEOUS | Status: AC
  Administered 2022-02-06: 6 mg via SUBCUTANEOUS
  Filled 2022-02-06: qty 0.6

## 2022-02-12 ENCOUNTER — Inpatient Hospital Stay: Payer: Medicare HMO | Admitting: Licensed Clinical Social Worker

## 2022-02-12 DIAGNOSIS — C189 Malignant neoplasm of colon, unspecified: Secondary | ICD-10-CM

## 2022-02-12 NOTE — Progress Notes (Signed)
[place] CSW Progress Note  Holiday representative met with patient to discuss adjustment issues.  Patient came with her daughter and primary caregiver Radford Pax Roth,Denise (Daughter) [phone].  Patient reported she is having adjustment concerns and is still experiencing difficulties believing she has cancer, although she received a second opinion from [place].  CSW and patient discussed different ways she could feel more trust with the providers and RNS, we discussed self-advocacy including asking questions, even if she thought they were irrelevant.  CSW patient and her daughter discussed end-of-life care concerns and the importance of making these decisions so the process was what the patient wanted. Patient stated she is still experiencing doubts about the efficacy of treatment at [facility], but she would "give it a try".  Patient and CSW discussed faith and how that could help with acceptance and through out treatment.  Patient scheduled appointment to meet with CSW on Feb 27, 2022 at 10:00AM    Adelene Amas, LCSW

## 2022-02-13 ENCOUNTER — Encounter: Payer: Self-pay | Admitting: Oncology

## 2022-02-13 DIAGNOSIS — I700 Atherosclerosis of aorta: Secondary | ICD-10-CM | POA: Diagnosis not present

## 2022-02-13 DIAGNOSIS — I10 Essential (primary) hypertension: Secondary | ICD-10-CM | POA: Diagnosis not present

## 2022-02-13 DIAGNOSIS — F329 Major depressive disorder, single episode, unspecified: Secondary | ICD-10-CM | POA: Diagnosis not present

## 2022-02-13 DIAGNOSIS — E785 Hyperlipidemia, unspecified: Secondary | ICD-10-CM | POA: Diagnosis not present

## 2022-02-13 DIAGNOSIS — F411 Generalized anxiety disorder: Secondary | ICD-10-CM | POA: Diagnosis not present

## 2022-02-13 DIAGNOSIS — C787 Secondary malignant neoplasm of liver and intrahepatic bile duct: Secondary | ICD-10-CM | POA: Diagnosis not present

## 2022-02-13 DIAGNOSIS — C189 Malignant neoplasm of colon, unspecified: Secondary | ICD-10-CM | POA: Diagnosis not present

## 2022-02-14 ENCOUNTER — Other Ambulatory Visit: Payer: Self-pay | Admitting: Internal Medicine

## 2022-02-16 ENCOUNTER — Encounter: Payer: Self-pay | Admitting: Oncology

## 2022-02-16 DIAGNOSIS — I10 Essential (primary) hypertension: Secondary | ICD-10-CM | POA: Diagnosis not present

## 2022-02-16 DIAGNOSIS — C189 Malignant neoplasm of colon, unspecified: Secondary | ICD-10-CM | POA: Diagnosis not present

## 2022-02-16 DIAGNOSIS — C787 Secondary malignant neoplasm of liver and intrahepatic bile duct: Secondary | ICD-10-CM | POA: Diagnosis not present

## 2022-02-16 DIAGNOSIS — I700 Atherosclerosis of aorta: Secondary | ICD-10-CM | POA: Diagnosis not present

## 2022-02-16 DIAGNOSIS — F329 Major depressive disorder, single episode, unspecified: Secondary | ICD-10-CM | POA: Diagnosis not present

## 2022-02-16 DIAGNOSIS — F411 Generalized anxiety disorder: Secondary | ICD-10-CM | POA: Diagnosis not present

## 2022-02-16 DIAGNOSIS — E785 Hyperlipidemia, unspecified: Secondary | ICD-10-CM | POA: Diagnosis not present

## 2022-02-16 NOTE — Telephone Encounter (Signed)
Requested medication (s) are due for refill today: yes  Requested medication (s) are on the active medication list: yes  Last refill:  12/31/21 #60/0  Future visit scheduled: yes  Notes to clinic:  Unable to refill per protocol, cannot delegate.    Requested Prescriptions  Pending Prescriptions Disp Refills   methocarbamol (ROBAXIN) 500 MG tablet [Pharmacy Med Name: METHOCARBAMOL 500 MG Tablet] 60 tablet 0    Sig: TAKE 1 TABLET TWICE DAILY AS NEEDED FOR MUSCLE SPASM(S)     Not Delegated - Analgesics:  Muscle Relaxants Failed - 02/14/2022  2:36 AM      Failed - This refill cannot be delegated      Passed - Valid encounter within last 6 months    Recent Outpatient Visits           3 months ago Pure hypercholesterolemia   [place], Coralie Keens, NP   5 months ago 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 [facility])   [facility], Coralie Keens, NP   7 months ago [facility], Coralie Keens, NP   8 months ago Cervical myelopathy [facility])   [facility], Coralie Keens, NP   10 months ago Colonic mass   [facility], Coralie Keens, NP       Future Appointments             In 2 months Baity, Coralie Keens, NP [facility], [facility]

## 2022-02-17 ENCOUNTER — Other Ambulatory Visit: Payer: Self-pay | Admitting: Oncology

## 2022-02-17 NOTE — Telephone Encounter (Signed)
Component Ref Range & Units 13 d ago (02/04/22) 3 wk ago (01/21/22) 1 mo ago (01/07/22) 1 mo ago (12/24/21) 2 mo ago (12/01/21) 3 mo ago (11/17/21) 3 mo ago (11/03/21)  Potassium 3.5 - 5.1 mmol/L 3.4 Low   3.5  3.4 Low   3.6  3.4 Low   3.5  3.4 Low

## 2022-02-18 ENCOUNTER — Inpatient Hospital Stay (HOSPITAL_BASED_OUTPATIENT_CLINIC_OR_DEPARTMENT_OTHER): Payer: Medicare HMO | Admitting: Oncology

## 2022-02-18 ENCOUNTER — Inpatient Hospital Stay: Payer: Medicare HMO

## 2022-02-18 ENCOUNTER — Ambulatory Visit: Payer: Self-pay | Admitting: *Deleted

## 2022-02-18 ENCOUNTER — Encounter: Payer: Self-pay | Admitting: Oncology

## 2022-02-18 VITALS — BP 116/73 | HR 76 | Temp 97.3°F | Resp 18 | Wt 169.9 lb

## 2022-02-18 DIAGNOSIS — C787 Secondary malignant neoplasm of liver and intrahepatic bile duct: Secondary | ICD-10-CM

## 2022-02-18 DIAGNOSIS — M25511 Pain in right shoulder: Secondary | ICD-10-CM | POA: Diagnosis not present

## 2022-02-18 DIAGNOSIS — Z5111 Encounter for antineoplastic chemotherapy: Secondary | ICD-10-CM | POA: Diagnosis not present

## 2022-02-18 DIAGNOSIS — R634 Abnormal weight loss: Secondary | ICD-10-CM

## 2022-02-18 DIAGNOSIS — C189 Malignant neoplasm of colon, unspecified: Secondary | ICD-10-CM

## 2022-02-18 DIAGNOSIS — E876 Hypokalemia: Secondary | ICD-10-CM

## 2022-02-18 DIAGNOSIS — E860 Dehydration: Secondary | ICD-10-CM

## 2022-02-18 DIAGNOSIS — M25551 Pain in right hip: Secondary | ICD-10-CM

## 2022-02-18 DIAGNOSIS — Z95828 Presence of other vascular implants and grafts: Secondary | ICD-10-CM

## 2022-02-18 DIAGNOSIS — D649 Anemia, unspecified: Secondary | ICD-10-CM

## 2022-02-18 DIAGNOSIS — Z79899 Other long term (current) drug therapy: Secondary | ICD-10-CM | POA: Diagnosis not present

## 2022-02-18 DIAGNOSIS — Z5112 Encounter for antineoplastic immunotherapy: Secondary | ICD-10-CM | POA: Diagnosis not present

## 2022-02-18 DIAGNOSIS — D701 Agranulocytosis secondary to cancer chemotherapy: Secondary | ICD-10-CM | POA: Diagnosis not present

## 2022-02-18 LAB — CBC WITH DIFFERENTIAL/PLATELET
Abs Immature Granulocytes: 0.45 10*3/uL — ABNORMAL HIGH (ref 0.00–0.07)
Basophils Absolute: 0.1 10*3/uL (ref 0.0–0.1)
Basophils Relative: 1 %
Eosinophils Absolute: 0.1 10*3/uL (ref 0.0–0.5)
Eosinophils Relative: 1 %
HCT: 33.2 % — ABNORMAL LOW (ref 36.0–46.0)
Hemoglobin: 10.4 g/dL — ABNORMAL LOW (ref 12.0–15.0)
Immature Granulocytes: 3 %
Lymphocytes Relative: 19 %
Lymphs Abs: 2.6 10*3/uL (ref 0.7–4.0)
MCH: 32.1 pg (ref 26.0–34.0)
MCHC: 31.3 g/dL (ref 30.0–36.0)
MCV: 102.5 fL — ABNORMAL HIGH (ref 80.0–100.0)
Monocytes Absolute: 1.4 10*3/uL — ABNORMAL HIGH (ref 0.1–1.0)
Monocytes Relative: 11 %
Neutro Abs: 8.9 10*3/uL — ABNORMAL HIGH (ref 1.7–7.7)
Neutrophils Relative %: 65 %
Platelets: 175 10*3/uL (ref 150–400)
RBC: 3.24 MIL/uL — ABNORMAL LOW (ref 3.87–5.11)
RDW: 19.8 % — ABNORMAL HIGH (ref 11.5–15.5)
WBC: 13.7 10*3/uL — ABNORMAL HIGH (ref 4.0–10.5)
nRBC: 0.9 % — ABNORMAL HIGH (ref 0.0–0.2)

## 2022-02-18 LAB — COMPREHENSIVE METABOLIC PANEL
ALT: 17 U/L (ref 0–44)
AST: 20 U/L (ref 15–41)
Albumin: 3.6 g/dL (ref 3.5–5.0)
Alkaline Phosphatase: 115 U/L (ref 38–126)
Anion gap: 8 (ref 5–15)
BUN: 20 mg/dL (ref 8–23)
CO2: 28 mmol/L (ref 22–32)
Calcium: 9 mg/dL (ref 8.9–10.3)
Chloride: 101 mmol/L (ref 98–111)
Creatinine, Ser: 1.05 mg/dL — ABNORMAL HIGH (ref 0.44–1.00)
GFR, Estimated: 55 mL/min — ABNORMAL LOW (ref >60)
Glucose, Bld: 102 mg/dL — ABNORMAL HIGH (ref 70–99)
Potassium: 3.6 mmol/L (ref 3.5–5.1)
Sodium: 137 mmol/L (ref 135–145)
Total Bilirubin: 0.3 mg/dL (ref 0.3–1.2)
Total Protein: 7.4 g/dL (ref 6.5–8.1)

## 2022-02-18 LAB — PROTEIN, URINE, RANDOM: Total Protein, Urine: 7 mg/dL

## 2022-02-18 MED ORDER — SODIUM CHLORIDE 0.9 % IV SOLN
Freq: Once | INTRAVENOUS | Status: AC
  Filled 2022-02-18: qty 250

## 2022-02-18 MED ORDER — HEPARIN SOD (PORK) LOCK FLUSH 100 UNIT/ML IV SOLN
500.0000 [IU] | Freq: Once | INTRAVENOUS | Status: AC
  Administered 2022-02-18: 500 [IU] via INTRAVENOUS
  Filled 2022-02-18: qty 5

## 2022-02-18 NOTE — Telephone Encounter (Signed)
  Chief Complaint: right shoulder pain Symptoms: severe pain in right shoulder, fell x 1 week ago. Hx neck surgery, left shoulder pain not as severe as right. Right arm weakness , difficulty picking up a fork. C/o numbness in fingertips and right toes. Using pain patches for pain . Pain comes and goes. Swelling per caregiver in right shoulder.  Frequency: greater than 1 week  Pertinent Negatives: Patient denies weakness in right leg. No chest pain or difficulty breathing Disposition: '[x]'$ ED /'[]'$ Urgent Care (no appt availability in office) / '[]'$ Appointment(In office/virtual)/ '[]'$  [facility]/ '[]'$ Home Care/ '[x]'$ Refused Recommended Disposition /'[]'$ [facility]/ '[]'$  Follow-up with PCP Additional Notes:   Caregiver Anne Ng called to report patient c/o right shoulder pain per oncology staff to report to PCP. Recommended ED due to weakness in right arm. Caregiver reports patient does not want to go . Patient is receiving IV infusing today at oncology center. Please advise if appt can be scheduled today . Can call Anne Ng [phone] or patient's daughter. FC Rachael informed patient did not want to go to ED for F/U  Reason for Disposition  Weakness (i.e., loss of strength) in hand or fingers (Exception: not truly weak; hand feels weak because of pain)  Answer Assessment - Initial Assessment Questions 1. ONSET: "When did the pain start?"     More than a week  2. LOCATION: "Where is the pain located?"     Right shoulder is more severe than left  3. PAIN: "How bad is the pain?" (Scale 1-10; or mild, moderate, severe)   - MILD (1-3): doesn't interfere with normal activities   - MODERATE (4-7): interferes with normal activities (e.g., work or school) or awakens from sleep   - SEVERE (8-10): excruciating pain, unable to do any normal activities, unable to move arm at all due to pain     Severe difficulty picking up a fork  4. WORK OR EXERCISE: "Has there been any recent work or exercise  that involved this part of the body?"     Fell one week ago  5. CAUSE: "What do you think is causing the shoulder pain?"     Possible fall  6. OTHER SYMPTOMS: "Do you have any other symptoms?" (e.g., neck pain, swelling, rash, fever, numbness, weakness)     Swelling weakness finger tips numb right side  7. PREGNANCY: "Is there any chance you are pregnant?" "When was your last menstrual period?"     na  Protocols used: Shoulder Pain-A-AH

## 2022-02-18 NOTE — Telephone Encounter (Signed)
Would recommend urgent care as we do not have xray on Friday

## 2022-02-18 NOTE — Telephone Encounter (Addendum)
Advised Chelsea [name] to go to an Urgent Care.  She states someone already called her about this and advised her to go to the ER.  She says the cancer center told her "to her PCP and that is what I'm doing."  I stated she should at least get evaluated at an Urgent Care today.  She said "I'm just calling to let you know, I'm sorry I bothered you." Then pt disconnected the call.

## 2022-02-18 NOTE — Telephone Encounter (Signed)
Patient calling to request appt. Patient was told by oncology to call PCP due to right arm weakness and shoulder pain. Recommended in earlier call for patient to be evaluated in ED. Patient reports she might go . Please advise if appt can be made .   Reason for Disposition  Requesting regular office appointment  Answer Assessment - Initial Assessment Questions 1. REASON FOR CALL or QUESTION: "What is your reason for calling today?" or "How can I best help you?" or "What question do you have that I can help answer?"     Patient calling requesting appt. Right shoulder pain  Protocols used: Information Only Call - No Triage-A-AH

## 2022-02-18 NOTE — Telephone Encounter (Signed)
Duplicate message.... See telephone encounter.   Thanks,   -Mickel Baas

## 2022-02-18 NOTE — Patient Instructions (Signed)
[facility] AT [place]  Discharge Instructions: Thank you for choosing [facility] to provide your oncology and hematology care.  If you have a lab appointment with the [facility], please go directly to the [facility] and check in at the registration area.  Wear comfortable clothing and clothing appropriate for easy access to any Portacath or PICC line.   We strive to give you quality time with your provider. You may need to reschedule your appointment if you arrive late (15 or more minutes).  Arriving late affects you and other patients whose appointments are after yours.  Also, if you miss three or more appointments without notifying the office, you may be dismissed from the clinic at the provider's discretion.      For prescription refill requests, have your pharmacy contact our office and allow 72 hours for refills to be completed.    Today you received the following chemotherapy and/or immunotherapy agents HYDRATION       To help prevent nausea and vomiting after your treatment, we encourage you to take your nausea medication as directed.  BELOW ARE SYMPTOMS THAT SHOULD BE REPORTED IMMEDIATELY: *FEVER GREATER THAN 100.4 F (38 C) OR HIGHER *CHILLS OR SWEATING *NAUSEA AND VOMITING THAT IS NOT CONTROLLED WITH YOUR NAUSEA MEDICATION *UNUSUAL SHORTNESS OF BREATH *UNUSUAL BRUISING OR BLEEDING *URINARY PROBLEMS (pain or burning when urinating, or frequent urination) *BOWEL PROBLEMS (unusual diarrhea, constipation, pain near the anus) TENDERNESS IN MOUTH AND THROAT WITH OR WITHOUT PRESENCE OF ULCERS (sore throat, sores in mouth, or a toothache) UNUSUAL RASH, SWELLING OR PAIN  UNUSUAL VAGINAL DISCHARGE OR ITCHING   Items with * indicate a potential emergency and should be followed up as soon as possible or go to the Emergency Department if any problems should occur.  Please show the CHEMOTHERAPY ALERT CARD or IMMUNOTHERAPY ALERT CARD at check-in to  the Emergency Department and triage nurse.  Should you have questions after your visit or need to cancel or reschedule your appointment, please contact [facility] [place] AT [place]  [phone] and follow the prompts.  Office hours are 8:00 a.m. to 4:30 p.m. Monday - Friday. Please note that voicemails left after 4:00 p.m. may not be returned until the following business day.  We are closed weekends and major holidays. You have access to a nurse at all times for urgent questions. Please call the main number to the clinic [phone] and follow the prompts.  For any non-urgent questions, you may also contact your provider using MyChart. We now offer e-Visits for anyone 34 and older to request care online for non-urgent symptoms. For details visit [email or web address].   Also download the MyChart app! Go to the app store, search "MyChart", open the app, select [facility], and log in with your MyChart username and password.  Due to Covid, a mask is required upon entering the hospital/clinic. If you do not have a mask, one will be given to you upon arrival. For doctor visits, patients may have 1 support person aged 64 or older with them. For treatment visits, patients cannot have anyone with them due to current Covid guidelines and our immunocompromised population.   Dehydration, Adult Dehydration is a condition in which there is not enough water or other fluids in the body. This happens when a person loses more fluids than he or she takes in. Important organs, such as the kidneys, brain, and heart, cannot function without a proper amount of fluids. Any loss of fluids  from the body can lead to dehydration. Dehydration can be mild, moderate, or severe. It should be treated right away to prevent it from becoming severe. What are the causes? Dehydration may be caused by: Conditions that cause loss of water or other fluids, such as diarrhea, vomiting, or sweating or urinating a lot. Not  drinking enough fluids, especially when you are ill or doing activities that require a lot of energy. Other illnesses and conditions, such as fever or infection. Certain medicines, such as medicines that remove excess fluid from the body (diuretics). Lack of safe drinking water. Not being able to get enough water and food. What increases the risk? The following factors may make you more likely to develop this condition: Having a long-term (chronic) illness that has not been treated properly, such as diabetes, heart disease, or kidney disease. Being 62 years of age or older. Having a disability. Living in a place that is high in altitude, where thinner, drier air causes more fluid loss. Doing exercises that put stress on your body for a long time (endurance sports). What are the signs or symptoms? Symptoms of dehydration depend on how severe it is. Mild or moderate dehydration Thirst. Dry lips or dry mouth. Dizziness or light-headedness, especially when standing up from a seated position. Muscle cramps. Dark urine. Urine may be the color of tea. Less urine or tears produced than usual. Headache. Severe dehydration Changes in skin. Your skin may be cold and clammy, blotchy, or pale. Your skin also may not return to normal after being lightly pinched and released. Little or no tears, urine, or sweat. Changes in vital signs, such as rapid breathing and low blood pressure. Your pulse may be weak or may be faster than 100 beats a minute when you are sitting still. Other changes, such as: Feeling very thirsty. Sunken eyes. Cold hands and feet. Confusion. Being very tired (lethargic) or having trouble waking from sleep. Short-term weight loss. Loss of consciousness. How is this diagnosed? This condition is diagnosed based on your symptoms and a physical exam. You may have blood and urine tests to help confirm the diagnosis. How is this treated? Treatment for this condition depends on how  severe it is. Treatment should be started right away. Do not wait until dehydration becomes severe. Severe dehydration is an emergency and needs to be treated in a hospital. Mild or moderate dehydration can be treated at home. You may be asked to: Drink more fluids. Drink an oral rehydration solution (ORS). This drink helps restore proper amounts of fluids and salts and minerals in the blood (electrolytes). Severe dehydration can be treated: With IV fluids. By correcting abnormal levels of electrolytes. This is often done by giving electrolytes through a tube that is passed through your nose and into your stomach (nasogastric tube, or NG tube). By treating the underlying cause of dehydration. Follow these instructions at home: Oral rehydration solution If told by your health care provider, drink an ORS: Make an ORS by following instructions on the package. Start by drinking small amounts, about  cup (120 mL) every 5-10 minutes. Slowly increase how much you drink until you have taken the amount recommended by your health care provider. Eating and drinking        Drink enough clear fluid to keep your urine pale yellow. If you were told to drink an ORS, finish the ORS first and then start slowly drinking other clear fluids. Drink fluids such as: Water. Do not drink only water. Doing  that can lead to hyponatremia, which is having too little salt (sodium) in the body. Water from ice chips you suck on. Fruit juice that you have added water to (diluted fruit juice). Low-calorie sports drinks. Eat foods that contain a healthy balance of electrolytes, such as bananas, oranges, potatoes, tomatoes, and spinach. Do not drink alcohol. Avoid the following: Drinks that contain a lot of sugar. These include high-calorie sports drinks, fruit juice that is not diluted, and soda. Caffeine. Foods that are greasy or contain a lot of fat or sugar. General instructions Take over-the-counter and  prescription medicines only as told by your health care provider. Do not take sodium tablets. Doing that can lead to having too much sodium in the body (hypernatremia). Return to your normal activities as told by your health care provider. Ask your health care provider what activities are safe for you. Keep all follow-up visits as told by your health care provider. This is important. Contact a health care provider if: You have muscle cramps, pain, or discomfort, such as: Pain in your abdomen and the pain gets worse or stays in one area (localizes). Stiff neck. You have a rash. You are more irritable than usual. You are sleepier or have a harder time waking than usual. You feel weak or dizzy. You feel very thirsty. Get help right away if you have: Any symptoms of severe dehydration. Symptoms of vomiting, such as: You cannot eat or drink without vomiting. Vomiting gets worse or does not go away. Vomit includes blood or green matter (bile). Symptoms that get worse with treatment. A fever. A severe headache. Problems with urination or bowel movements, such as: Diarrhea that gets worse or does not go away. Blood in your stool (feces). This may cause stool to look black and tarry. Not urinating, or urinating only a small amount of very dark urine, within 6-8 hours. Trouble breathing. These symptoms may represent a serious problem that is an emergency. Do not wait to see if the symptoms will go away. Get medical help right away. Call your local emergency services (911 in the [place] Do not drive yourself to the hospital. Summary Dehydration is a condition in which there is not enough water or other fluids in the body. This happens when a person loses more fluids than he or she takes in. Treatment for this condition depends on how severe it is. Treatment should be started right away. Do not wait until dehydration becomes severe. Drink enough clear fluid to keep your urine pale yellow. If you  were told to drink an oral rehydration solution (ORS), finish the ORS first and then start slowly drinking other clear fluids. Take over-the-counter and prescription medicines only as told by your health care provider. Get help right away if you have any symptoms of severe dehydration. This information is not intended to replace advice given to you by your health care provider. Make sure you discuss any questions you have with your health care provider. Document Revised: 05/04/2019 Document Reviewed: 05/04/2019 Elsevier Patient Education  [place].

## 2022-02-18 NOTE — Progress Notes (Signed)
Patient here for follow up. Pt reports that she had a fall since last visit. She reports pain to right shoulder and it is getting worse. Pt reports that she is constipated.

## 2022-02-18 NOTE — Progress Notes (Signed)
Hematology/Oncology Progress note [phone] [phone]         Patient Care Team: Jearld Fenton, NP as PCP - General (Internal Medicine) Clent Jacks, RN as Oncology Nurse Navigator [name], MD as Consulting Physician (Oncology)  REFERRING PROVIDER: Jearld Fenton, NP  CHIEF COMPLAINTS/REASON FOR VISIT:  metastatic colon cancer  HISTORY OF PRESENTING ILLNESS:   Chelsea [name] is a  76 y.o.  female with PMH listed below was seen in consultation at the request of  Jearld Fenton, NP  for evaluation of metastatic colon cancer.  Patient initially went to emergency room on 03/24/2021 for abdominal pain. 03/24/2021, CT scan of the abdomen showed marked bladder wall thickening with surrounding soft tissue stranding is noted.  Concerning for cystitis.  There is a lobulated mass between the posterior wall of the bladder and the rectum which appears to arise from the vaginal cuff.  This is indeterminate and difficult to characterize reflecting lack of IV contrast material.  Nonobstructing left renal calculus, left sacroiliitis, lumbar spondylosis aortic atherosclerosis. 03/24/2021 CT pelvic showed abdominal [place] soft tissue of right cecum, concerning for colon carcinoma.  Colonoscopy recommended.  Low-attenuation mass in the region of vaginal cuff is again noted.  Diffuse urinary bladder wall thickening and mild bilateral hydroureter possibly cystitis.  [place] 03/24/2021 Lobulated solid 3.8 cm mass confirmed by ultrasound. This is most compatible with a neoplasm but remains indeterminate as to the origin as the epicenter seems to be outside of both the vaginal cuff and the adjacent colon, while the lesion appears inseparable from both. Extensive echogenic debris within the distended urinary bladder.   03/26/2021, patient was seen by Dr. Theora Gianotti for evaluation of colonic/vaginal cuff pelvic mass.  Patient also was referred to gastroenterology for  colonoscopy.  03/26/2021 CEA 3.4.  CA125 9.7 04/04/2021, status post colonoscopy which showed a frond like /villous nonobstructing large mass was found in the cecum, this was biopsied.  Terminal ileum was briefly intubated and appeared normal.  Diverticulosis in the sigmoid colon.  The rectum, sigmoid colon, descending colon, transverse colon and ascending colon were normal.  Nonbleeding internal hemorrhoids. 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    Given that the biopsy may not be representative of the entire underlying lesion.  Patient was recommended to establish with surgeon for evaluation. Patient no showed for her surgery appointment and as well as her follow-up appointment with gastroenterology.  Per daughter, patient was having cervical spine surgery and she prioritized that over the colon surgery.  08/19/2021, patient establish care with surgery Dr. Christian Mate 08/21/2021, CT abdomen pelvis with contrast  showed soft tissue mass at the base of cecum measuring up to 5 cm, slightly increased in size.  Interval development of multiple low-density lesions throughout the liver highly suggestive for metastatic disease.  Complex mass in the region of the vaginal cuff measuring up to 5.2 cm, increased in size.  Right ovary also appears more prominent on the current examination.  Moderate large volume of stool throughout the colon.  Mild urinary bladder wall thickening.  Colonic diverticulosis without evidence of active diverticulitis.  Aortic atherosclerosis  09/04/2021, patient is status post left lobe liver lesion biopsy and pathology showed metastatic adenocarcinoma, compatible with colorectal primary.  She was referred to establish care with oncology. She was accompanied by her daughter today.  Patient walks with a walker.+ Night sweats + weight loss.  Denies any abdominal pain currently, melena, blood in the stool.  She is  currently being followed by home health  PT/OT following a previous spinal surgery  10/10/2020, status post Mediport placement. 10/20/2020, cycle one 5-FU/bevacizumab.  Foundation one liquid biopsy testing showed KRASG12V, APC TP53 TMB 4  12/22/2021 CT chest abdomen pelvis Decreased cecum mass, however increased size and size of metastatic lesions throughout the liver.  Interval enlargement of vaginal cuff mass. No mets in chest.   # During the interval, patient was seen by gynecology oncology Dr. Fransisca Connors.  Biopsy of the vaginal mass was not recommended. Patient's case was also discussed on multidisciplinary tumor board.  IR potentially can try a biopsy if patient is willing to. I had a discussion with patient's daughter over the phone prior to this visit.  We discussed about option of adding oxaliplatin to 5-FU/bevacizumab and continue treatment of colon cancer, repeat CT scan short-term and if progression, repeat biopsy versus proceeding with vaginal mass biopsy.  Daughter prefers proceeding with chemotherapy and defer biopsy.   01/07/22, FOLFOX/bev  01/08/2022 patient has had a second opinion at [facility] and was seen by Dr. Reynaldo Minium .  Dr. Reynaldo Minium agrees with the current treatment plan with FOLFOX/bevacizumab.  He has ordered guardant 360 to see if she may be eligible for clinical trial in the future.     INTERVAL HISTORY Chelsea [name] is a 76 y.o. female who has above history reviewed by me today presents for follow up visit for management of metastatic colon cancer Patient was accompanied by her daughter.  + Fall,   No loss of consciousness.  + acute on chronic  right shoulder pain,  + nausea + weakness.  + constipation, on laxative.  +Appetite is not good.  Food tastes like sand.  No nausea vomiting.  Denies dysuria, fever or chills.  Chronic neuropathy, primarily numbness of fingertips, patient is on gabapentin.  Denies any burning sensation or tingling.  She has lost 5 pounds since last visit.  Review of Systems   Constitutional:  Positive for fatigue. Negative for appetite change, chills, fever and unexpected weight change.  HENT:   Negative for hearing loss and voice change.   Eyes:  Negative for eye problems.  Respiratory:  Negative for chest tightness and cough.   Cardiovascular:  Negative for chest pain.  Gastrointestinal:  Negative for abdominal distention, abdominal pain, blood in stool and diarrhea.  Endocrine: Negative for hot flashes.  Genitourinary:  Negative for difficulty urinating and frequency.   Musculoskeletal:  Positive for back pain. Negative for arthralgias.  Skin:  Negative for itching and rash.  Neurological:  Positive for numbness (chornic finger tips). Negative for extremity weakness.  Hematological:  Negative for adenopathy.  Psychiatric/Behavioral:  Negative for confusion.    MEDICAL HISTORY:  Past Medical History:  Diagnosis Date   Anxiety    Arthritis    Depression    Hyperlipidemia    Hypertension    Liver mass    Metastatic colon cancer to liver ([place])    Moderate mitral insufficiency    Port-A-Cath in place    Seizures [facility])    Spinal stenosis    Ulcer    Urinary incontinence     SURGICAL HISTORY: Past Surgical History:  Procedure Laterality Date   ABDOMINAL HYSTERECTOMY     BACK SURGERY     due to polio   BREAST BIOPSY Bilateral    neg   BREAST BIOPSY Right 2011   neg/stereo   CARPAL TUNNEL RELEASE     CATARACT EXTRACTION Right 2020   COLONOSCOPY WITH  PROPOFOL N/A 04/04/2021   Procedure: COLONOSCOPY WITH PROPOFOL;  Surgeon: Virgel Manifold, MD;  Location: [facility] ENDOSCOPY;  Service: Endoscopy;  Laterality: N/A;   EYE SURGERY     IR IMAGING GUIDED PORT INSERTION  10/09/2021    SOCIAL HISTORY: Social History   Socioeconomic History   Marital status: Divorced    Spouse name: Not on file   Number of children: Not on file   Years of education: Not on file   Highest education level: Not on file  Occupational History   Not on file  Tobacco  Use   Smoking status: Never   Smokeless tobacco: Never  Vaping Use   Vaping Use: Never used  Substance and Sexual Activity   Alcohol use: Never   Drug use: No   Sexual activity: Not Currently  Other Topics Concern   Not on file  Social History Narrative   Not on file   Social Determinants of Health   Financial Resource Strain: Low Risk    Difficulty of Paying Living Expenses: Not very hard  Food Insecurity: No Food Insecurity   Worried About Running Out of Food in the Last Year: Never true   Ran Out of Food in the Last Year: Never true  Transportation Needs: No Transportation Needs   Lack of Transportation (Medical): No   Lack of Transportation (Non-Medical): No  Physical Activity: Inactive   Days of Exercise per Week: 0 days   Minutes of Exercise per Session: 0 min  Stress: Stress Concern Present   Feeling of Stress : To some extent  Social Connections: Moderately Integrated   Frequency of Communication with Friends and Family: Three times a week   Frequency of Social Gatherings with Friends and Family: Three times a week   Attends Religious Services: 1 to 4 times per year   Active Member of Clubs or Organizations: No   Attends Archivist Meetings: 1 to 4 times per year   Marital Status: Widowed  Human resources officer Violence: Not At Risk   Fear of Current or Ex-Partner: No   Emotionally Abused: No   Physically Abused: No   Sexually Abused: No    FAMILY HISTORY: Family History  Problem Relation Age of Onset   Arthritis Mother    Heart disease Mother    Stroke Mother    Hypertension Mother    COPD Mother    Sudden death Sister    Other Father        unknown medical history   Breast cancer Neg Hx     ALLERGIES:  is allergic to penicillins.  MEDICATIONS:  Current Outpatient Medications  Medication Sig Dispense Refill   acetaminophen (TYLENOL) 500 MG tablet Take 500 mg by mouth every 6 (six) hours as needed.     ALPRAZolam (XANAX) 0.5 MG tablet TAKE 1  TABLET BY MOUTH TWICE A DAY AS NEEDED FOR ANXIETY 60 tablet 0   aspirin 325 MG tablet Take 325 mg by mouth daily.     atorvastatin (LIPITOR) 10 MG tablet TAKE 1 TABLET BY MOUTH EVERY DAY 90 tablet 0   busPIRone (BUSPAR) 5 MG tablet Take 1 tablet (5 mg total) by mouth 3 (three) times daily. 270 tablet 1   Cyanocobalamin (VITAMIN B 12 PO) Take 500 mcg by mouth daily.     diclofenac Sodium (VOLTAREN) 1 % GEL Apply 4 g topically 4 (four) times daily. 4 g 0   FLUoxetine (PROZAC) 20 MG capsule TAKE 3 CAPSULES (60 MG TOTAL) BY  MOUTH EVERY MORNING. 270 capsule 0   gabapentin (NEURONTIN) 100 MG capsule TAKE 2 CAPSULES BY MOUTH 2 TIMES DAILY. 40 capsule 0   KLOR-CON M20 20 MEQ tablet TAKE 1 TABLET BY MOUTH EVERY DAY 30 tablet 1   lidocaine (LIDODERM) 5 % Place 1 patch onto the skin daily. Remove & Discard patch within 12 hours or as directed by MD 1 patch 0   lidocaine-prilocaine (EMLA) cream Apply small amount of cream to port site 1-2 hour prior to chemo treatment. 30 g 3   loratadine (CLARITIN) 10 MG tablet Take 1 tablet (10 mg total) by mouth See admin instructions. Take 1 tablet daily for 4 days after each chemotherapy treatments. 30 tablet 2   meclizine (ANTIVERT) 12.5 MG tablet Take 1 tablet (12.5 mg total) by mouth 3 (three) times daily as needed for dizziness. 30 tablet 0   methocarbamol (ROBAXIN) 500 MG tablet TAKE 1 TABLET TWICE DAILY AS NEEDED FOR MUSCLE SPASM(S) 60 tablet 0   ondansetron (ZOFRAN) 8 MG tablet Take 1 tablet (8 mg total) by mouth 2 (two) times daily as needed (Nausea or vomiting). 30 tablet 1   polyethylene glycol powder (GLYCOLAX/MIRALAX) 17 GM/SCOOP powder Take 17 g by mouth daily. 3350 g 1   prochlorperazine (COMPAZINE) 10 MG tablet Take 1 tablet (10 mg total) by mouth every 6 (six) hours as needed (Nausea or vomiting). 30 tablet 1   triamterene-hydrochlorothiazide (MAXZIDE-25) 37.5-25 MG tablet TAKE 1 TABLET BY MOUTH EVERY DAY 90 tablet 0   vitamin E 180 MG (400 UNITS) capsule  Take 400 Units by mouth daily.     loperamide (IMODIUM) 2 MG capsule TAKE 1 CAP BY MOUTH SEE ADMIN INSTRUCTIONS. INITIAL: 4 MG, FOLLOWED BY 2 MG AFTER EACH LOOSE STOOL MAXIMUM: 16 MG/DAY (Patient not taking: Reported on 02/18/2022) 60 capsule 0   No current facility-administered medications for this visit.     PHYSICAL EXAMINATION: ECOG PERFORMANCE STATUS: 2 - Symptomatic, <50% confined to bed Vitals:   02/18/22 0841  BP: 116/73  Pulse: 76  Resp: 18  Temp: (!) 97.3 F (36.3 C)   Filed Weights   02/18/22 0841  Weight: 169 lb 14.4 oz (77.1 kg)     Physical Exam Constitutional:      General: She is not in acute distress.    Comments: Patient sits in wheelchair  HENT:     Head: Normocephalic and atraumatic.  Eyes:     General: No scleral icterus. Cardiovascular:     Rate and Rhythm: Normal rate and regular rhythm.     Heart sounds: Normal heart sounds.  Pulmonary:     Effort: Pulmonary effort is normal. No respiratory distress.     Breath sounds: No wheezing.  Abdominal:     General: Bowel sounds are normal. There is no distension.     Palpations: Abdomen is soft.  Musculoskeletal:        General: No deformity. Normal range of motion.     Cervical back: Normal range of motion and neck supple.  Skin:    General: Skin is warm and dry.     Findings: No erythema or rash.  Neurological:     Mental Status: She is alert and oriented to person, place, and time. Mental status is at baseline.     Cranial Nerves: No cranial nerve deficit.     Coordination: Coordination normal.  Psychiatric:        Mood and Affect: Mood normal.    LABORATORY DATA:  I  have reviewed the data as listed Lab Results  Component Value Date   WBC 13.7 (H) 02/18/2022   HGB 10.4 (L) 02/18/2022   HCT 33.2 (L) 02/18/2022   MCV 102.5 (H) 02/18/2022   PLT 175 02/18/2022   Recent Labs    01/21/22 0809 02/04/22 1012 02/18/22 0804  NA 136 137 137  K 3.5 3.4* 3.6  CL 103 101 101  CO2 _0 GLUCOSE 113* 112* 102*  BUN _1 CREATININE 0.92 1.02* 1.05*  CALCIUM 8.7* 9.1 9.0  GFRNONAA >60 57* 55*  PROT 6.9 7.4 7.4  ALBUMIN 3.3* 3.5 3.6  AST _2 ALT _3 ALKPHOS 65 103 115  BILITOT 0.6 0.6 0.3    Iron/TIBC/Ferritin/ %Sat    Component Value Date/Time   FERRITIN 58 10/24/2020 0402       RADIOGRAPHIC STUDIES: I have personally reviewed the radiological images as listed and agreed with the findings in the report. No results found.    ASSESSMENT & PLAN:  1. Acute pain of right shoulder   2. Metastatic colon cancer to liver ([place])   3. Encounter for antineoplastic chemotherapy   4. Weight loss   5. Hypokalemia   6. Normocytic anemia    Cancer Staging  Metastatic colon cancer to liver [facility]) Staging form: Colon and Rectum - Neuroendocine Tumors, AJCC 8th Edition - Clinical stage from 09/16/2021: Stage IV (cTX, cNX, cM1) - Signed by [name], MD on 09/16/2021  #Metastatic colon cancer to liver, 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  No sufficient tissue for NGS Liquid biopsy showed KRAS12V, APC, TP53 mutation, TMB 4.  S/p 6 cycles of 5-FU /Bevacizumab--> CT showed mixed response--> FOLFOX/bevacizumab.  Labs reviewed and discussed with patient Hold off treatments due to multiple complaints, weakness, weight loss. Poor oral intake, patient will get 1 L of IV fluid normal saline x1.  #Chemotherapy-induced neutropenia, patient gets prophylactic G-CSF with Udenyca. Claritin 10 mg daily for 4 days.  She may take Tylenol if she experiences bone pain.  Leukocytosis due to previous G-CSF.  No signs of acute infection.  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  Shared decision was made to defer biopsy currently, proceed with treatment for metastatic colon cancer and repeat short-term CT for follow-up.  #Hypokalemia,  stable.  Continue potassium chloride 10mq daily.   #Weight loss, continue follow-up with nutritionist.   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  Stable.  Continue gabapentin  #Her creatinine levels fluctuates, recommend patient to increase oral hydration.  IV normal saline x1. #Right shoulder pain, acute on chronic.  Likely arthritis.  She reports that her shoulder pain has no relation to the fall.  I recommend patient to call primary care provider for further recommendation. . Supportive care measures are necessary for patient well-being and will be provided as necessary. \All questions were answered. The patient knows to call the clinic with any problems questions or concerns.  cc BJearld Fenton NP   Follow-up lab MD 1 week FOLFOX/bevacizumab.  [name], PhD 02/18/2022

## 2022-02-19 ENCOUNTER — Ambulatory Visit
Admission: RE | Admit: 2022-02-19 | Discharge: 2022-02-19 | Disposition: A | Discharge status: Home / Self Care | Payer: Medicare HMO | Source: Ambulatory Visit | Attending: Internal Medicine | Admitting: Internal Medicine

## 2022-02-19 ENCOUNTER — Ambulatory Visit
Admission: RE | Admit: 2022-02-19 | Discharge: 2022-02-19 | Disposition: A | Discharge status: Home / Self Care | Payer: Medicare HMO | Attending: Internal Medicine | Admitting: Internal Medicine

## 2022-02-19 ENCOUNTER — Inpatient Hospital Stay (HOSPITAL_BASED_OUTPATIENT_CLINIC_OR_DEPARTMENT_OTHER): Payer: Medicare HMO | Admitting: Hospice and Palliative Medicine

## 2022-02-19 ENCOUNTER — Encounter: Payer: Self-pay | Admitting: Internal Medicine

## 2022-02-19 ENCOUNTER — Ambulatory Visit (INDEPENDENT_AMBULATORY_CARE_PROVIDER_SITE_OTHER): Payer: Medicare HMO | Admitting: Internal Medicine

## 2022-02-19 VITALS — BP 126/84 | HR 83 | Temp 96.2°F | Wt 170.0 lb

## 2022-02-19 DIAGNOSIS — M545 Low back pain, unspecified: Secondary | ICD-10-CM | POA: Diagnosis not present

## 2022-02-19 DIAGNOSIS — M25511 Pain in right shoulder: Secondary | ICD-10-CM | POA: Diagnosis not present

## 2022-02-19 DIAGNOSIS — R269 Unspecified abnormalities of gait and mobility: Secondary | ICD-10-CM | POA: Diagnosis not present

## 2022-02-19 DIAGNOSIS — M5136 Other intervertebral disc degeneration, lumbar region: Secondary | ICD-10-CM | POA: Diagnosis not present

## 2022-02-19 DIAGNOSIS — Z515 Encounter for palliative care: Secondary | ICD-10-CM

## 2022-02-19 DIAGNOSIS — R29898 Other symptoms and signs involving the musculoskeletal system: Secondary | ICD-10-CM | POA: Diagnosis not present

## 2022-02-19 DIAGNOSIS — R202 Paresthesia of skin: Secondary | ICD-10-CM | POA: Diagnosis not present

## 2022-02-19 LAB — CEA: CEA: 2.8 ng/mL (ref 0.0–4.7)

## 2022-02-19 IMAGING — DX DG LUMBAR SPINE COMPLETE 4+V
5 series · 5 of 5 positions shown · non-contrast
Comparison: None Available.

CLINICAL DATA: Low back pain

EXAM:
LUMBAR SPINE - COMPLETE 4+ VIEW

[l-spine obl (1 of 2)]
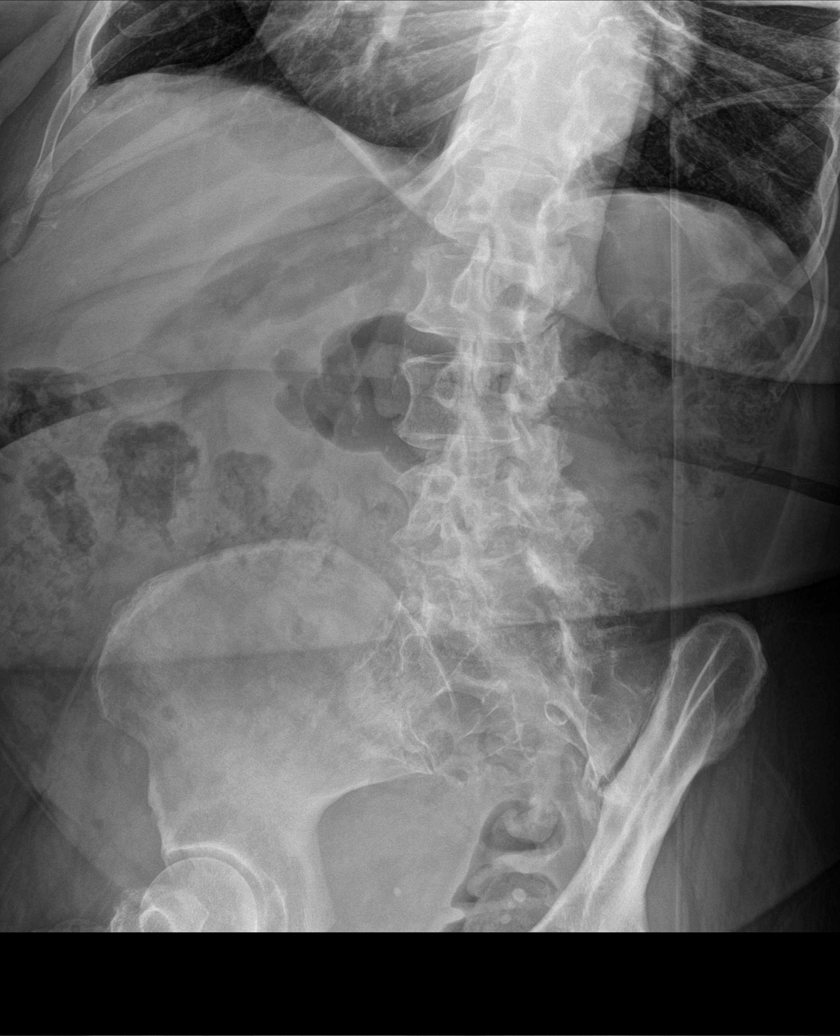

[l-spine obl (2 of 2)]
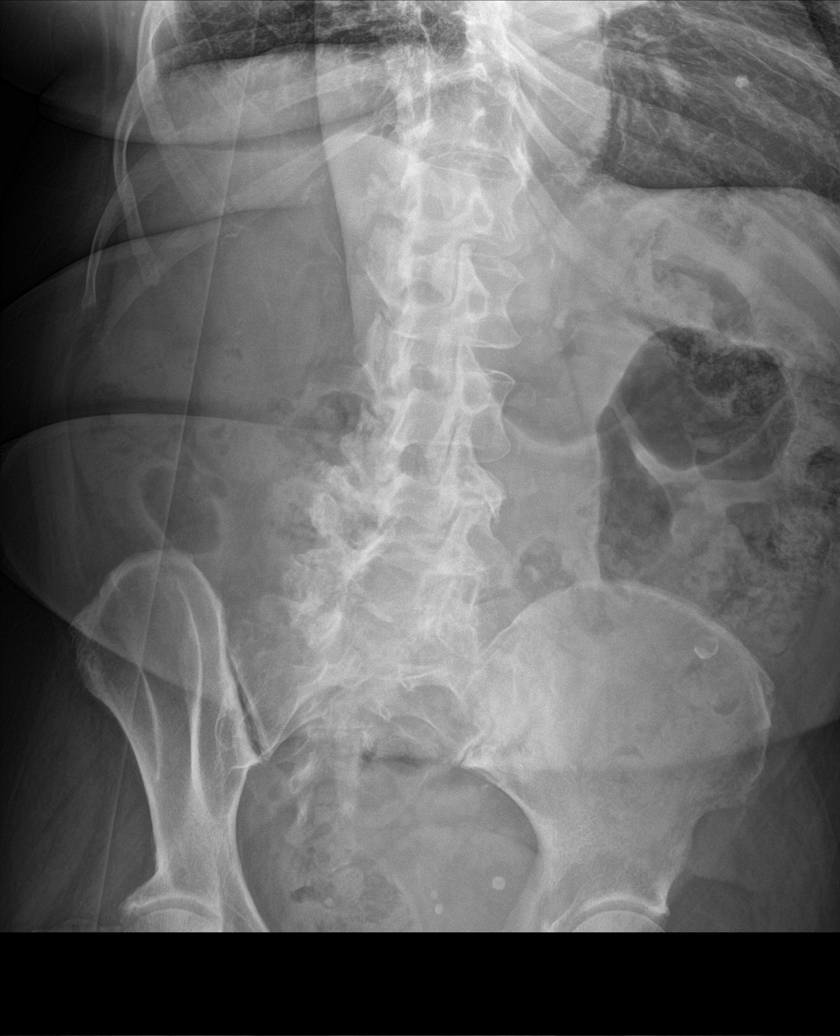

[l-spine ap]
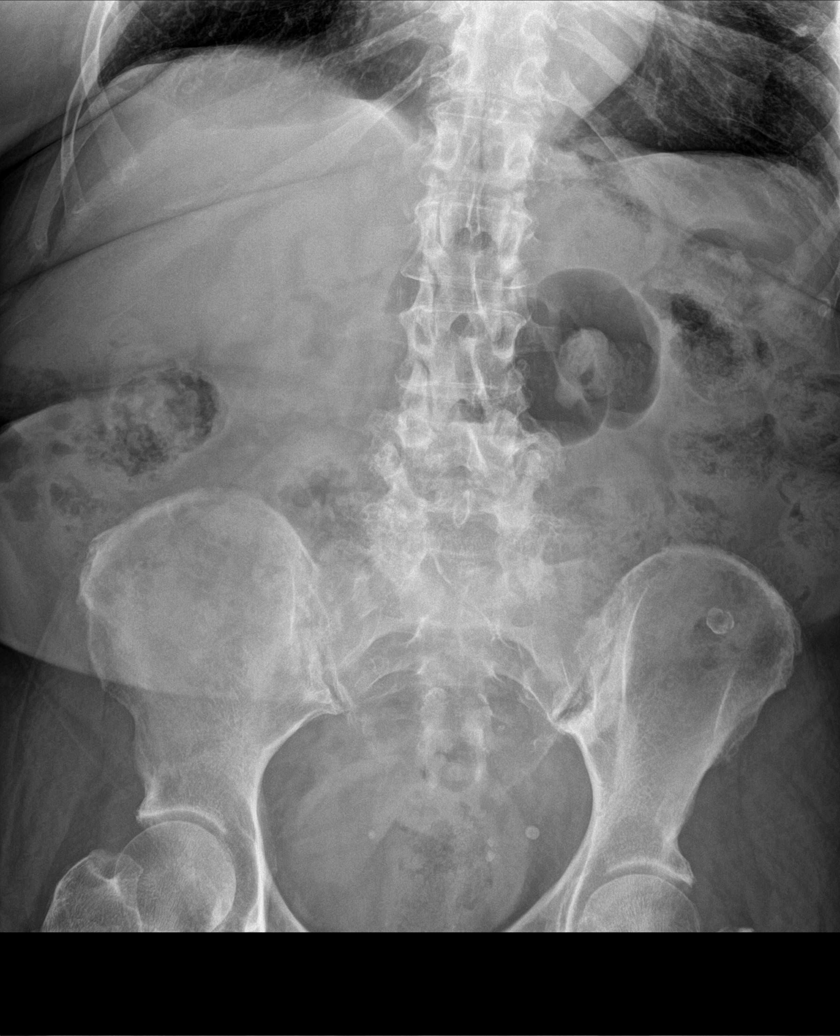

[l-spine lat]
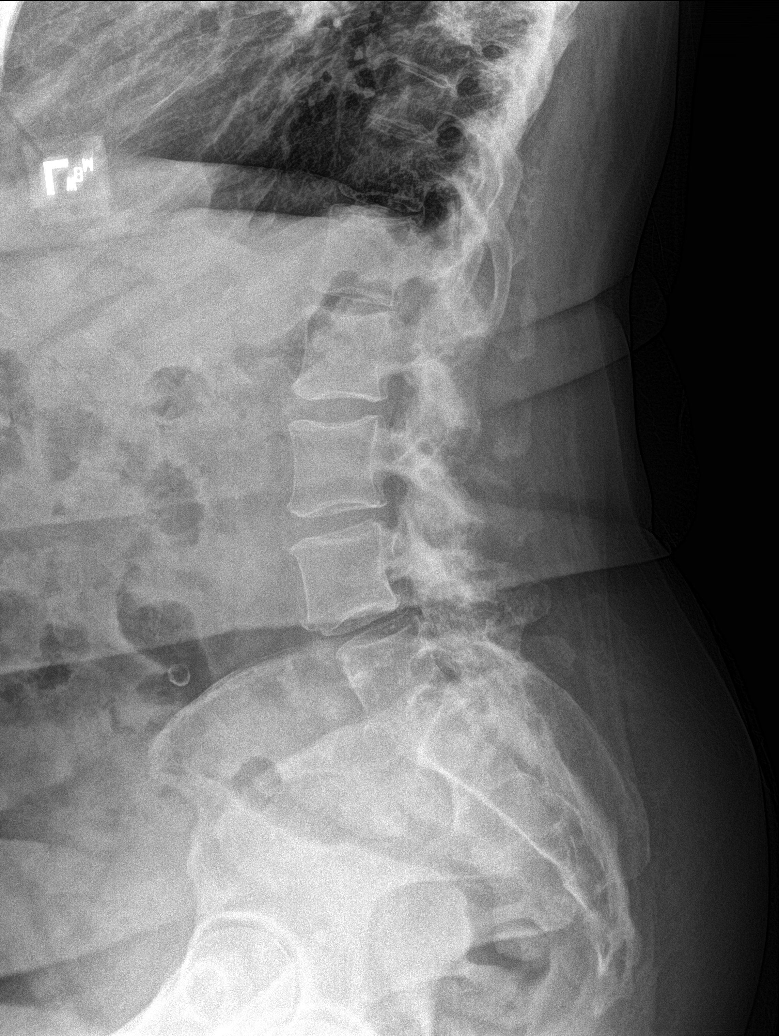

[l-spine spot]
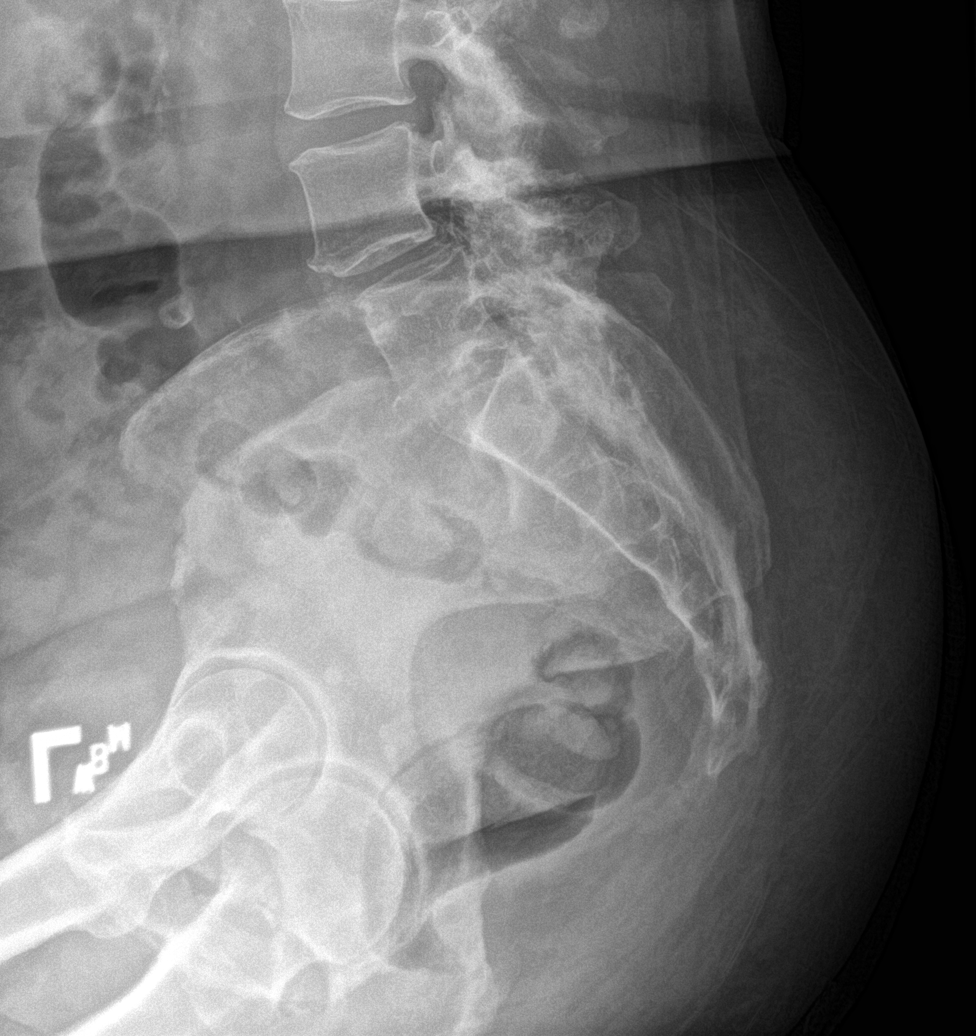

[5 of 5 positions shown; findings below may reference images not displayed]

FINDINGS: Lumbar vertebral body height are preserved without evidence of
fracture. Grade 1 anterolisthesis of L4 on L5. Moderate
intervertebral disc space narrowing at L5-S1. Facet arthropathy
throughout the lumbar spine. No spondylolysis identified.
IMPRESSION: Degenerative changes of the lumbar spine with no acute fracture
identified.

## 2022-02-19 IMAGING — DX DG SHOULDER 2+V*R*
4 series · 4 of 4 positions shown · non-contrast
Comparison: Right shoulder x-ray [DATE]

CLINICAL DATA: Right shoulder pain

EXAM:
RIGHT SHOULDER - 2+ VIEW

[shoulder ap (1 of 2)]
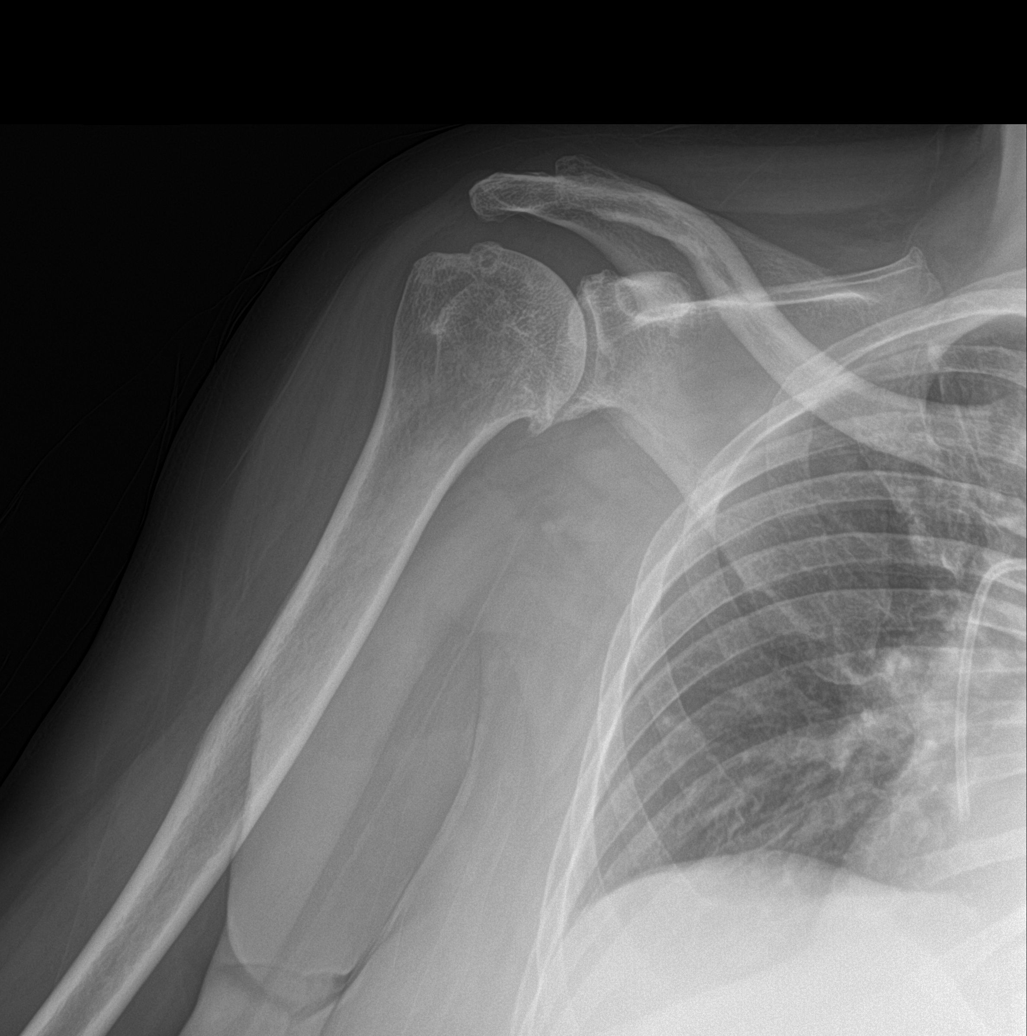

[shoulder y-view]
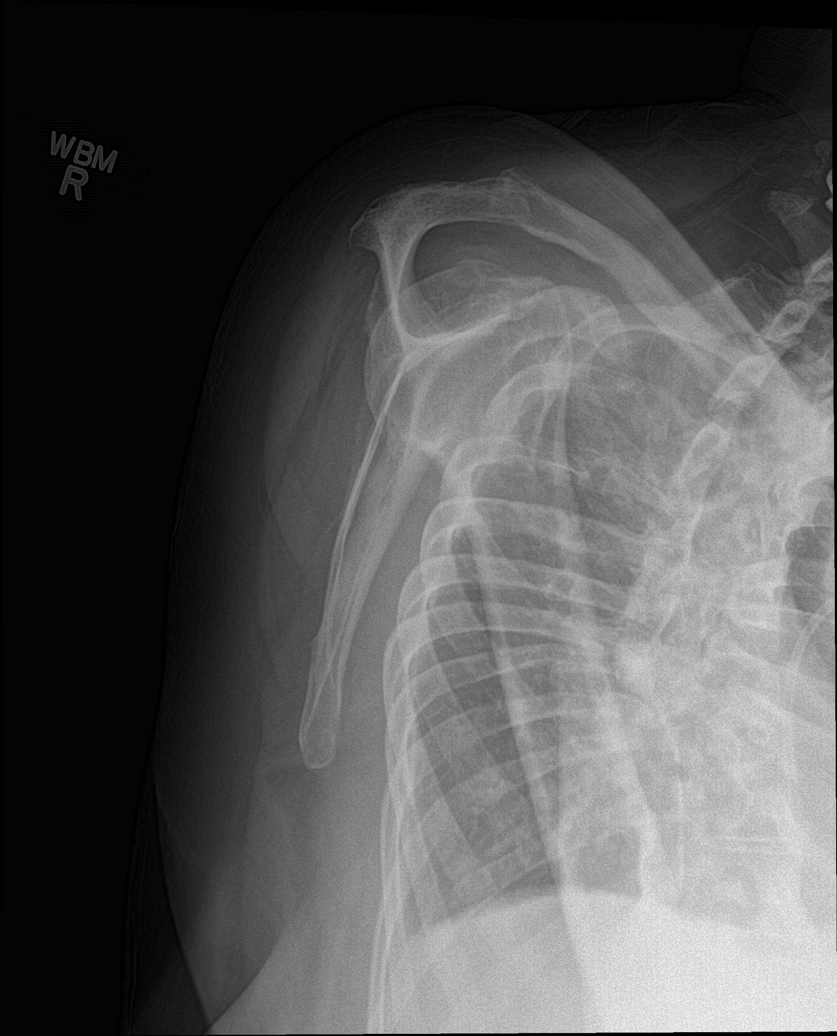

[shoulder ap (2 of 2)]
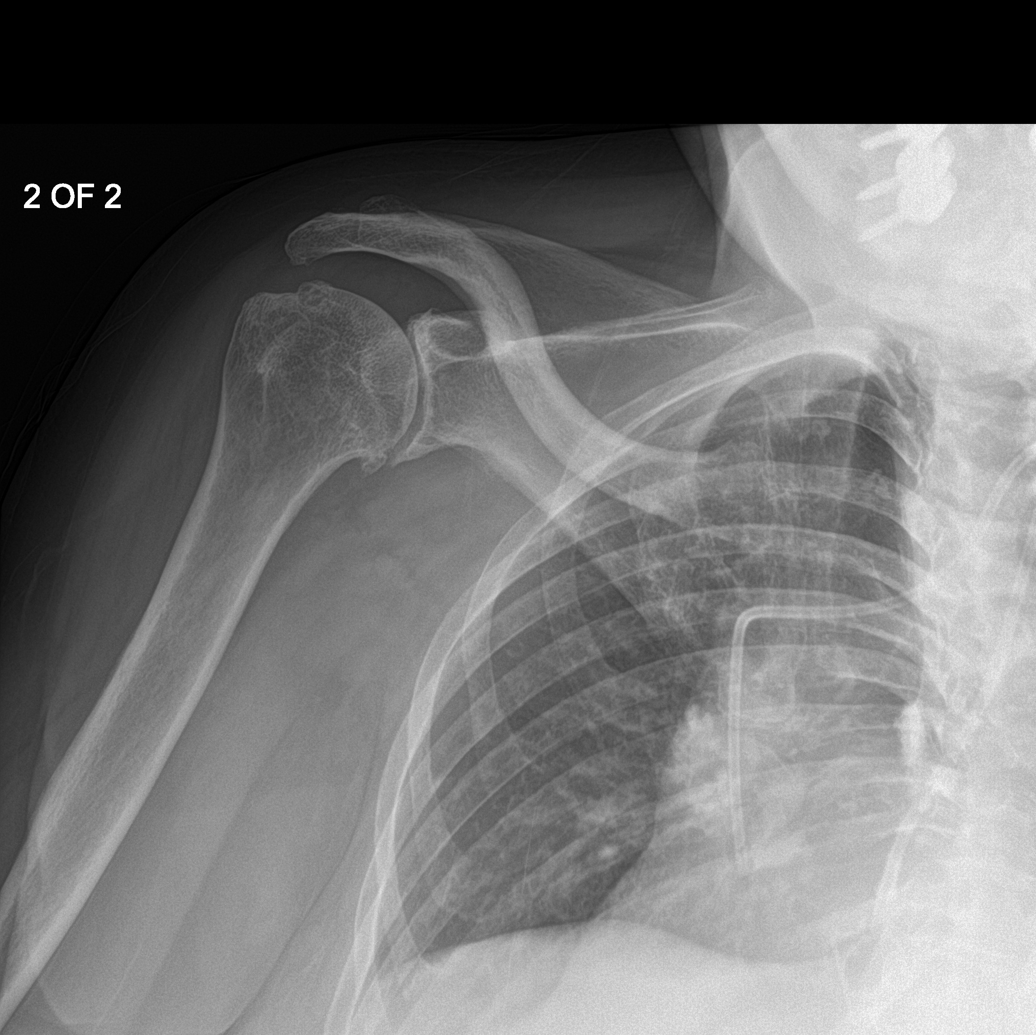

[shoulder axial]
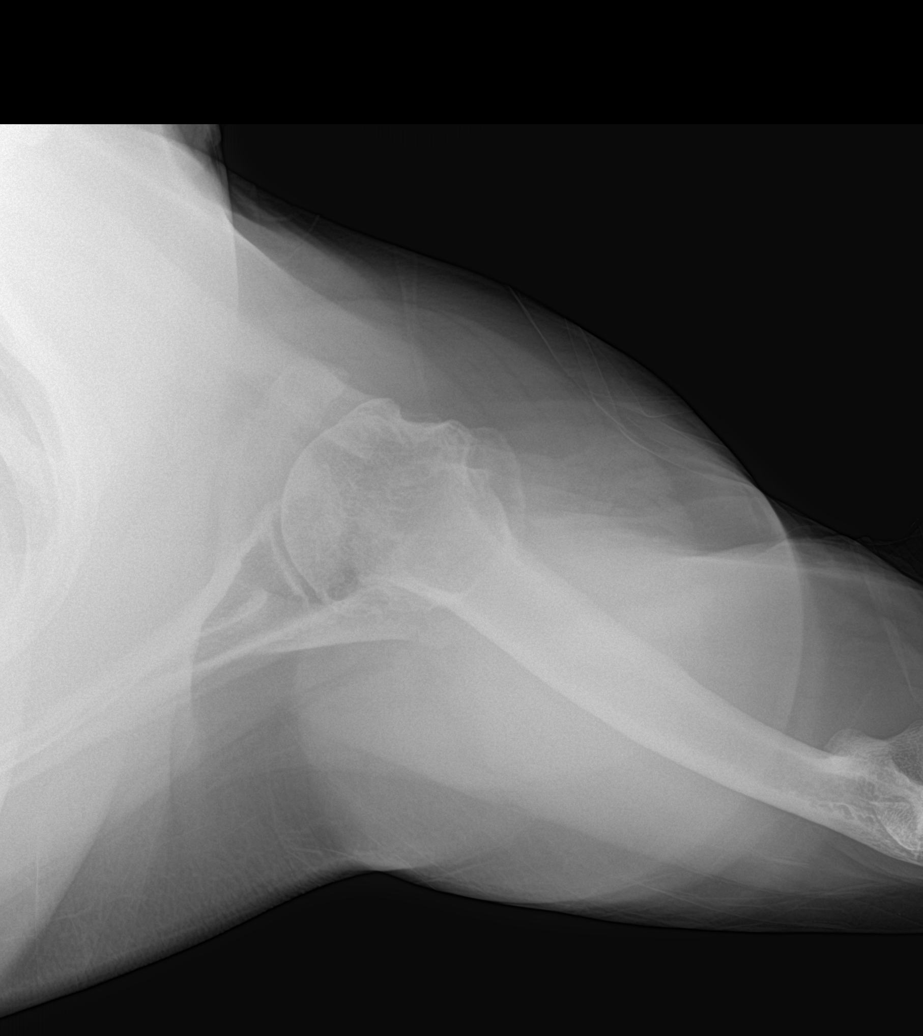

[4 of 4 positions shown; findings below may reference images not displayed]

FINDINGS: No acute fracture or dislocation identified. Moderate to severe
narrowing of the glenohumeral joint with large inferior marginal
osteophytes. Mild narrowing of the acromioclavicular joint. Small
likely subcortical cysts at the greater tuberosity of the humerus.
IMPRESSION: Degenerative changes of the shoulder as described.

## 2022-02-19 NOTE — Patient Instructions (Signed)
Cervical Radiculopathy  Cervical radiculopathy means that a nerve in the neck (a cervical nerve) is pinched or bruised. This can happen because of an injury to the cervical spine (vertebrae) in the neck, or as a normal part of getting older. This condition can cause pain or loss of feeling (numbness) that runs from your neck all the way down to your arm and fingers. Often, this condition gets better with rest. Treatment may be needed if the condition does not get better. What are the causes? A neck injury. A bulging disk in your spine. Sudden muscle tightening (muscle spasms). Tight muscles in your neck due to overuse. Arthritis. Breakdown in the bones and joints of the spine (spondylosis) due to getting older. Bone spurs that form near the nerves in the neck. What are the signs or symptoms? Pain. The pain may: Run from the neck to the arm and hand. Be very bad or irritating. Get worse when you move your neck. Loss of feeling or tingling in your arm or hand. Weakness in your arm or hand, in very bad cases. How is this treated? In many cases, treatment is not needed for this condition. With rest, the condition often gets better over time. If treatment is needed, options may include: Wearing a soft neck collar (cervical collar) for short periods of time. Doing exercises (physical therapy) to strengthen your neck muscles. Taking medicines. Having shots (injections) in your spine, in very bad cases. Having surgery. This may be needed if other treatments do not help. The type of surgery that is used will depend on the cause of your condition. Follow these instructions at home: If you have a soft neck collar: Wear it as told by your doctor. Take it off only as told by your doctor. Ask your doctor if you can take the collar off for cleaning and bathing. If you are allowed to take the collar off for cleaning or bathing: Follow instructions from your doctor about how to take off the collar  safely. Clean the collar by wiping it with mild soap and water and drying it completely. Take out any removable pads in the collar every 1-2 days. Wash them by hand with soap and water. Let them air-dry completely before you put them back in the collar. Check your skin under the collar for redness or sores. If you see any, tell your doctor. Managing pain     Take over-the-counter and prescription medicines only as told by your doctor. If told, put ice on the painful area. To do this: If you have a soft neck collar, take if off as told by your doctor. Put ice in a plastic bag. Place a towel between your skin and the bag. Leave the ice on for 20 minutes, 2-3 times a day. Take off the ice if your skin turns bright red. This is very important. If you cannot feel pain, heat, or cold, you have a greater risk of damage to the area. If using ice does not help, you can try using heat. Use the heat source that your doctor recommends, such as a moist heat pack or a heating pad. Place a towel between your skin and the heat source. Leave the heat on for 20-30 minutes. Take off the heat if your skin turns bright red. This is very important. If you cannot feel pain, heat, or cold, you have a greater risk of getting burned. You may try a gentle neck and shoulder rub (massage). Activity Rest as needed. Return   to your normal activities when your doctor says that it is safe. Do exercises as told by your doctor or physical therapist. You may have to avoid lifting. Ask your doctor how much you can safely lift. General instructions Use a flat pillow when you sleep. Do not drive while wearing a soft neck collar. If you do not have a soft neck collar, ask your doctor if it is safe to drive while your neck heals. Ask your doctor if you should avoid driving or using machines while you are taking your medicine. Do not smoke or use any products that contain nicotine or tobacco. If you need help quitting, ask your  doctor. Keep all follow-up visits. Contact a doctor if: Your condition does not get better with treatment. Get help right away if: Your pain gets worse and medicine does not help. You lose feeling or feel weak in your hand, arm, face, or leg. You have a high fever. Your neck is stiff. You cannot control when you poop or pee (have incontinence). You have trouble with walking, balance, or talking. Summary Cervical radiculopathy means that a nerve in the neck is pinched or bruised. A nerve can get pinched from a bulging disk, arthritis, an injury to the neck, or other causes. Symptoms include pain, tingling, or loss of feeling that goes from the neck to the arm or hand. Weakness in your arm or hand can happen in very bad cases. Treatment may include resting, wearing a soft neck collar, and doing exercises. You might need to take medicines for pain. In very bad cases, shots or surgery may be needed. This information is not intended to replace advice given to you by your health care provider. Make sure you discuss any questions you have with your health care provider. Document Revised: 03/27/2021 Document Reviewed: 03/27/2021 Elsevier Patient Education  2023 [employer]  

## 2022-02-19 NOTE — Progress Notes (Signed)
Subjective:    Patient ID: Chelsea [name], female    DOB: 1946-09-30, 76 y.o.   MRN: [id number]  HPI  Patient presents to clinic today with complaint of shoulder pain, right arm weakness and right lower extremity weakness.  This started a few weeks ago but seems to be consistently getting worse.  She describes the right shoulder pain as sore and achy.  The pain can be sharp with certain movements.  She reports associated numbness and weakness in the right upper extremity but denies tingling.  She reports the right lower extremity weakness has been progressive.  She feels like she is dragging her right foot.  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  She has tried Voltaren gel, gabapentin and methocarbamol OTC with minimal relief of symptoms.  She reports she has been working with PT but they called her today and told her that if she was to continue she would need a new referral.  Review of Systems   Past Medical History:  Diagnosis Date   Anxiety    Arthritis    Depression    Hyperlipidemia    Hypertension    Liver mass    Metastatic colon cancer to liver ([place])    Moderate mitral insufficiency    Port-A-Cath in place    Seizures [facility])    Spinal stenosis    Ulcer    Urinary incontinence     Current Outpatient Medications  Medication Sig Dispense Refill   acetaminophen (TYLENOL) 500 MG tablet Take 500 mg by mouth every 6 (six) hours as needed.     ALPRAZolam (XANAX) 0.5 MG tablet TAKE 1 TABLET BY MOUTH TWICE A DAY AS NEEDED FOR ANXIETY 60 tablet 0   aspirin 325 MG tablet Take 325 mg by mouth daily.     atorvastatin (LIPITOR) 10 MG tablet TAKE 1 TABLET BY MOUTH EVERY DAY 90 tablet 0   busPIRone (BUSPAR) 5 MG tablet Take 1 tablet (5 mg total) by mouth 3 (three) times daily. 270 tablet 1   Cyanocobalamin (VITAMIN B 12 PO) Take 500 mcg by mouth daily.     diclofenac Sodium  (VOLTAREN) 1 % GEL Apply 4 g topically 4 (four) times daily. 4 g 0   FLUoxetine (PROZAC) 20 MG capsule TAKE 3 CAPSULES (60 MG TOTAL) BY MOUTH EVERY MORNING. 270 capsule 0   gabapentin (NEURONTIN) 100 MG capsule TAKE 2 CAPSULES BY MOUTH 2 TIMES DAILY. 40 capsule 0   KLOR-CON M20 20 MEQ tablet TAKE 1 TABLET BY MOUTH EVERY DAY 30 tablet 1   lidocaine (LIDODERM) 5 % Place 1 patch onto the skin daily. Remove & Discard patch within 12 hours or as directed by MD 1 patch 0   lidocaine-prilocaine (EMLA) cream Apply small amount of cream to port site 1-2 hour prior to chemo treatment. 30 g 3   loperamide (IMODIUM) 2 MG capsule TAKE 1 CAP BY MOUTH SEE ADMIN INSTRUCTIONS. INITIAL: 4 MG, FOLLOWED BY 2 MG AFTER EACH LOOSE STOOL MAXIMUM: 16 MG/DAY (Patient not taking: Reported on 02/18/2022) 60 capsule 0   loratadine (CLARITIN) 10 MG tablet Take 1 tablet (10 mg total) by mouth See admin instructions. Take 1 tablet daily for 4 days after each chemotherapy treatments. 30 tablet 2   meclizine (ANTIVERT) 12.5 MG tablet Take 1 tablet (12.5 mg total) by mouth 3 (three) times daily as needed for dizziness. 30 tablet  0   methocarbamol (ROBAXIN) 500 MG tablet TAKE 1 TABLET TWICE DAILY AS NEEDED FOR MUSCLE SPASM(S) 60 tablet 0   ondansetron (ZOFRAN) 8 MG tablet Take 1 tablet (8 mg total) by mouth 2 (two) times daily as needed (Nausea or vomiting). 30 tablet 1   polyethylene glycol powder (GLYCOLAX/MIRALAX) 17 GM/SCOOP powder Take 17 g by mouth daily. 3350 g 1   prochlorperazine (COMPAZINE) 10 MG tablet Take 1 tablet (10 mg total) by mouth every 6 (six) hours as needed (Nausea or vomiting). 30 tablet 1   triamterene-hydrochlorothiazide (MAXZIDE-25) 37.5-25 MG tablet TAKE 1 TABLET BY MOUTH EVERY DAY 90 tablet 0   vitamin E 180 MG (400 UNITS) capsule Take 400 Units by mouth daily.     No current facility-administered medications for this visit.    Allergies  Allergen Reactions   Penicillins     "Everything turned black"     Family History  Problem Relation Age of Onset   Arthritis Mother    Heart disease Mother    Stroke Mother    Hypertension Mother    COPD Mother    Sudden death Sister    Other Father        unknown medical history   Breast cancer Neg Hx     Social History   Socioeconomic History   Marital status: Divorced    Spouse name: Not on file   Number of children: Not on file   Years of education: Not on file   Highest education level: Not on file  Occupational History   Not on file  Tobacco Use   Smoking status: Never   Smokeless tobacco: Never  Vaping Use   Vaping Use: Never used  Substance and Sexual Activity   Alcohol use: Never   Drug use: No   Sexual activity: Not Currently  Other Topics Concern   Not on file  Social History Narrative   Not on file   Social Determinants of Health   Financial Resource Strain: Low Risk    Difficulty of Paying Living Expenses: Not very hard  Food Insecurity: No Food Insecurity   Worried About Running Out of Food in the Last Year: Never true   Ran Out of Food in the Last Year: Never true  Transportation Needs: No Transportation Needs   Lack of Transportation (Medical): No   Lack of Transportation (Non-Medical): No  Physical Activity: Inactive   Days of Exercise per Week: 0 days   Minutes of Exercise per Session: 0 min  Stress: Stress Concern Present   Feeling of Stress : To some extent  Social Connections: Moderately Integrated   Frequency of Communication with Friends and Family: Three times a week   Frequency of Social Gatherings with Friends and Family: Three times a week   Attends Religious Services: 1 to 4 times per year   Active Member of Clubs or Organizations: No   Attends Archivist Meetings: 1 to 4 times per year   Marital Status: Widowed  Human resources officer Violence: Not At Risk   Fear of Current or Ex-Partner: No   Emotionally Abused: No   Physically Abused: No   Sexually Abused: No      Constitutional: Denies fever, malaise, fatigue, headache or abrupt weight changes.  Respiratory: Denies difficulty breathing, shortness of breath, cough or sputum production.   Cardiovascular: Denies chest pain, chest tightness, palpitations or swelling in the hands or feet.  Musculoskeletal: Patient reports right shoulder pain, right arm weakness, right leg  weakness, difficulty with gait.  Denies muscle pain or joint swelling.  Neurological: Patient reports numbness of her right upper extremity.  Denies dizziness, difficulty with memory, difficulty with speech or problems with balance and coordination.    No other specific complaints in a complete review of systems (except as listed in HPI above).   Objective:   Physical Exam BP 126/84 (BP Location: Left Arm, Patient Position: Sitting, Cuff Size: Large)   Pulse 83   Temp (!) 96.2 F (35.7 C) (Temporal)   Wt 170 lb (77.1 kg)   SpO2 98%   BMI 36.79 kg/m    Wt Readings from Last 3 Encounters:  02/18/22 169 lb 14.4 oz (77.1 kg)  02/04/22 174 lb 8 oz (79.2 kg)  01/21/22 178 lb 1.6 oz (80.8 kg)    General: Appears her stated age, chronically ill-appearing, in NAD. HEENT: Head: normal shape and size; Eyes: sclera white and EOMs intact;  Cardiovascular: Normal rate and rhythm. S1,S2 noted.  No murmur, rubs or gallops noted.  Radial pulse 2+ on the right.  Pedal pulse 2+ on the right. Pulmonary/Chest: Normal effort and positive vesicular breath sounds. No respiratory distress. No wheezes, rales or ronchi noted.  Musculoskeletal: Decreased flexion and extension of the cervical spine.  Normal rotation.  Shoulder shrug equal.  Normal external rotation of the right shoulder.  Decreased internal rotation of the right shoulder.  Negative drop can test on the right.  Strength 4/5 RUE, 5/5 LUE.  No bony tenderness noted over the lumbar spine.  Strength 3/5 RLE, 5/5 LLE.  Able to stand on tiptoes and heels with assistance.  Shuffling gait and  dragging her right leg. Neurological: Alert and oriented. Coordination normal but slow.    BMET    Component Value Date/Time   NA 137 02/18/2022 0804   K 3.6 02/18/2022 0804   CL 101 02/18/2022 0804   CO2 28 02/18/2022 0804   GLUCOSE 102 (H) 02/18/2022 0804   BUN 20 02/18/2022 0804   CREATININE 1.05 (H) 02/18/2022 0804   CREATININE 0.87 08/13/2020 1129   CALCIUM 9.0 02/18/2022 0804   GFRNONAA 55 (L) 02/18/2022 0804   GFRNONAA 65 08/13/2020 1129   GFRAA 76 08/13/2020 1129    Lipid Panel     Component Value Date/Time   CHOL 231 (H) 10/28/2021 1042   TRIG 62 10/28/2021 1042   HDL 64 10/28/2021 1042   CHOLHDL 3.6 10/28/2021 1042   VLDL 18.4 02/08/2013 1638   LDLCALC 152 (H) 10/28/2021 1042    CBC    Component Value Date/Time   WBC 13.7 (H) 02/18/2022 0804   RBC 3.24 (L) 02/18/2022 0804   HGB 10.4 (L) 02/18/2022 0804   HCT 33.2 (L) 02/18/2022 0804   PLT 175 02/18/2022 0804   MCV 102.5 (H) 02/18/2022 0804   MCH 32.1 02/18/2022 0804   MCHC 31.3 02/18/2022 0804   RDW 19.8 (H) 02/18/2022 0804   LYMPHSABS 2.6 02/18/2022 0804   MONOABS 1.4 (H) 02/18/2022 0804   EOSABS 0.1 02/18/2022 0804   BASOSABS 0.1 02/18/2022 0804    Hgb A1C Lab Results  Component Value Date   HGBA1C 5.6 10/28/2021            Assessment & Plan:   Right Shoulder Pain, Right Arm Paresthesia and Weakness, Right Leg Weakness:  X-ray right shoulder today X-ray lumbar spine Toradol 30 mg IM Referral to home health PT for strengthening exercises and gait training We will need to consider MRI cervical and lumbar  spine pending x-rays if she continues to have paresthesias and weakness She will continue Gabapentin, Voltaren gel and Methocarbamol as previously prescribed  RTC in 2 months for your annual exam Webb Silversmith, NP

## 2022-02-19 NOTE — Progress Notes (Signed)
Voicemail left.  Will reschedule. °

## 2022-02-20 ENCOUNTER — Telehealth: Payer: Self-pay

## 2022-02-20 ENCOUNTER — Inpatient Hospital Stay: Payer: Medicare HMO

## 2022-02-20 NOTE — Telephone Encounter (Signed)
1004 am.   Phone call made to patient to complete a telephonic visit and offer a home visit with Palliative Care NP.  No answer.  Message left requesting call back.

## 2022-02-25 ENCOUNTER — Inpatient Hospital Stay (HOSPITAL_BASED_OUTPATIENT_CLINIC_OR_DEPARTMENT_OTHER): Payer: Medicare HMO | Admitting: Oncology

## 2022-02-25 ENCOUNTER — Inpatient Hospital Stay: Payer: Medicare HMO

## 2022-02-25 ENCOUNTER — Encounter: Payer: Self-pay | Admitting: Oncology

## 2022-02-25 VITALS — BP 116/74 | HR 65 | Temp 96.1°F | Resp 18 | Wt 172.8 lb

## 2022-02-25 DIAGNOSIS — G629 Polyneuropathy, unspecified: Secondary | ICD-10-CM

## 2022-02-25 DIAGNOSIS — C787 Secondary malignant neoplasm of liver and intrahepatic bile duct: Secondary | ICD-10-CM

## 2022-02-25 DIAGNOSIS — Z79899 Other long term (current) drug therapy: Secondary | ICD-10-CM | POA: Diagnosis not present

## 2022-02-25 DIAGNOSIS — E876 Hypokalemia: Secondary | ICD-10-CM

## 2022-02-25 DIAGNOSIS — R634 Abnormal weight loss: Secondary | ICD-10-CM | POA: Diagnosis not present

## 2022-02-25 DIAGNOSIS — Z5111 Encounter for antineoplastic chemotherapy: Secondary | ICD-10-CM | POA: Diagnosis not present

## 2022-02-25 DIAGNOSIS — C189 Malignant neoplasm of colon, unspecified: Secondary | ICD-10-CM | POA: Diagnosis not present

## 2022-02-25 DIAGNOSIS — D701 Agranulocytosis secondary to cancer chemotherapy: Secondary | ICD-10-CM | POA: Diagnosis not present

## 2022-02-25 DIAGNOSIS — Z5112 Encounter for antineoplastic immunotherapy: Secondary | ICD-10-CM | POA: Diagnosis not present

## 2022-02-25 DIAGNOSIS — M25511 Pain in right shoulder: Secondary | ICD-10-CM | POA: Diagnosis not present

## 2022-02-25 LAB — CBC WITH DIFFERENTIAL/PLATELET
Abs Immature Granulocytes: 0.05 10*3/uL (ref 0.00–0.07)
Basophils Absolute: 0 10*3/uL (ref 0.0–0.1)
Basophils Relative: 1 %
Eosinophils Absolute: 0 10*3/uL (ref 0.0–0.5)
Eosinophils Relative: 0 %
HCT: 32 % — ABNORMAL LOW (ref 36.0–46.0)
Hemoglobin: 10.2 g/dL — ABNORMAL LOW (ref 12.0–15.0)
Immature Granulocytes: 1 %
Lymphocytes Relative: 26 %
Lymphs Abs: 2.1 10*3/uL (ref 0.7–4.0)
MCH: 32.7 pg (ref 26.0–34.0)
MCHC: 31.9 g/dL (ref 30.0–36.0)
MCV: 102.6 fL — ABNORMAL HIGH (ref 80.0–100.0)
Monocytes Absolute: 0.8 10*3/uL (ref 0.1–1.0)
Monocytes Relative: 10 %
Neutro Abs: 5.1 10*3/uL (ref 1.7–7.7)
Neutrophils Relative %: 62 %
Platelets: 272 10*3/uL (ref 150–400)
RBC: 3.12 MIL/uL — ABNORMAL LOW (ref 3.87–5.11)
RDW: 19.8 % — ABNORMAL HIGH (ref 11.5–15.5)
WBC: 8 10*3/uL (ref 4.0–10.5)
nRBC: 0 % (ref 0.0–0.2)

## 2022-02-25 LAB — COMPREHENSIVE METABOLIC PANEL
ALT: 14 U/L (ref 0–44)
AST: 19 U/L (ref 15–41)
Albumin: 3.6 g/dL (ref 3.5–5.0)
Alkaline Phosphatase: 88 U/L (ref 38–126)
Anion gap: 6 (ref 5–15)
BUN: 15 mg/dL (ref 8–23)
CO2: 30 mmol/L (ref 22–32)
Calcium: 9 mg/dL (ref 8.9–10.3)
Chloride: 101 mmol/L (ref 98–111)
Creatinine, Ser: 0.9 mg/dL (ref 0.44–1.00)
GFR, Estimated: >60 mL/min (ref >60)
Glucose, Bld: 95 mg/dL (ref 70–99)
Potassium: 3.7 mmol/L (ref 3.5–5.1)
Sodium: 137 mmol/L (ref 135–145)
Total Bilirubin: 0.5 mg/dL (ref 0.3–1.2)
Total Protein: 7.2 g/dL (ref 6.5–8.1)

## 2022-02-25 LAB — PROTEIN, URINE, RANDOM: Total Protein, Urine: <6 mg/dL

## 2022-02-25 MED ORDER — OXALIPLATIN CHEMO INJECTION 100 MG/20ML
65.0000 mg/m2 | Freq: Once | INTRAVENOUS | Status: AC
  Administered 2022-02-25: 120 mg via INTRAVENOUS
  Filled 2022-02-25: qty 10

## 2022-02-25 MED ORDER — SODIUM CHLORIDE 0.9 % IV SOLN
5.0000 mg/kg | Freq: Once | INTRAVENOUS | Status: AC
  Administered 2022-02-25: 400 mg via INTRAVENOUS
  Filled 2022-02-25: qty 16

## 2022-02-25 MED ORDER — PALONOSETRON HCL INJECTION 0.25 MG/5ML
0.2500 mg | Freq: Once | INTRAVENOUS | Status: AC
  Administered 2022-02-25: 0.25 mg via INTRAVENOUS
  Filled 2022-02-25: qty 5

## 2022-02-25 MED ORDER — SODIUM CHLORIDE 0.9 % IV SOLN
Freq: Once | INTRAVENOUS | Status: AC
  Filled 2022-02-25: qty 250

## 2022-02-25 MED ORDER — PROCHLORPERAZINE MALEATE 10 MG PO TABS
10.0000 mg | ORAL_TABLET | Freq: Once | ORAL | Status: AC
  Administered 2022-02-25: 10 mg via ORAL
  Filled 2022-02-25: qty 1

## 2022-02-25 MED ORDER — DOCUSATE SODIUM 100 MG PO CAPS: 100.0000 mg | ORAL_CAPSULE | Freq: Two times a day (BID) | ORAL | 0 refills | Status: DC

## 2022-02-25 MED ORDER — SODIUM CHLORIDE 0.9 % IV SOLN
2400.0000 mg/m2 | INTRAVENOUS | Status: DC
  Administered 2022-02-25: 4300 mg via INTRAVENOUS
  Filled 2022-02-25: qty 86

## 2022-02-25 MED ORDER — DEXTROSE 5 % IV SOLN
Freq: Once | INTRAVENOUS | Status: AC
  Filled 2022-02-25: qty 250

## 2022-02-25 MED ORDER — LEUCOVORIN CALCIUM INJECTION 350 MG
387.0000 mg/m2 | Freq: Once | INTRAVENOUS | Status: AC
  Administered 2022-02-25: 700 mg via INTRAVENOUS
  Filled 2022-02-25: qty 35

## 2022-02-25 NOTE — Progress Notes (Signed)
Pt here for follow up. Pt reports that she has a sore throat and mild cough.

## 2022-02-25 NOTE — Patient Instructions (Signed)
[facility]  Discharge Instructions: °Thank you for choosing [facility] to provide your oncology and hematology care.  ° °If you have a lab appointment with the Cancer Center, please go directly to the Cancer Center and check in at the registration area. °  °Wear comfortable clothing and clothing appropriate for easy access to any Portacath or PICC line.  ° °We strive to give you quality time with your provider. You may need to reschedule your appointment if you arrive late (15 or more minutes).  Arriving late affects you and other patients whose appointments are after yours.  Also, if you miss three or more appointments without notifying the office, you may be dismissed from the clinic at the provider’s discretion.    °  °For prescription refill requests, have your pharmacy contact our office and allow 72 hours for refills to be completed.   ° °Today you received the following chemotherapy and/or immunotherapy agents     °  °To help prevent nausea and vomiting after your treatment, we encourage you to take your nausea medication as directed. ° °BELOW ARE SYMPTOMS THAT SHOULD BE REPORTED IMMEDIATELY: °*FEVER GREATER THAN 100.4 F (38 °C) OR HIGHER °*CHILLS OR SWEATING °*NAUSEA AND VOMITING THAT IS NOT CONTROLLED WITH YOUR NAUSEA MEDICATION °*UNUSUAL SHORTNESS OF BREATH °*UNUSUAL BRUISING OR BLEEDING °*URINARY PROBLEMS (pain or burning when urinating, or frequent urination) °*BOWEL PROBLEMS (unusual diarrhea, constipation, pain near the anus) °TENDERNESS IN MOUTH AND THROAT WITH OR WITHOUT PRESENCE OF ULCERS (sore throat, sores in mouth, or a toothache) °UNUSUAL RASH, SWELLING OR PAIN  °UNUSUAL VAGINAL DISCHARGE OR ITCHING  ° °Items with * indicate a potential emergency and should be followed up as soon as possible or go to the Emergency Department if any problems should occur. ° °Please show the CHEMOTHERAPY ALERT CARD or IMMUNOTHERAPY ALERT CARD at check-in to the  Emergency Department and triage nurse. ° °Should you have questions after your visit or need to cancel or reschedule your appointment, please contact [facility]  Dept: [phone]  and follow the prompts.  Office hours are 8:00 a.m. to 4:30 p.m. Monday - Friday. Please note that voicemails left after 4:00 p.m. may not be returned until the following business day.  We are closed weekends and major holidays. You have access to a nurse at all times for urgent questions. Please call the main number to the clinic Dept: [phone] and follow the prompts. ° ° °For any non-urgent questions, you may also contact your provider using MyChart. We now offer e-Visits for anyone 18 and older to request care online for non-urgent symptoms. For details visit [email or web address]. °  °Also download the MyChart app! Go to the app store, search "MyChart", open the app, select [facility], and log in with your MyChart username and password. ° °Due to Covid, a mask is required upon entering the hospital/clinic. If you do not have a mask, one will be given to you upon arrival. For doctor visits, patients may have 1 support person aged 18 or older with them. For treatment visits, patients cannot have anyone with them due to current Covid guidelines and our immunocompromised population.  ° °

## 2022-02-25 NOTE — Progress Notes (Signed)
Hematology/Oncology Progress note [phone] [phone]         Patient Care Team: Jearld Fenton, NP as PCP - General (Internal Medicine) Clent Jacks, RN as Oncology Nurse Navigator [name], MD as Consulting Physician (Oncology)  REFERRING PROVIDER: Jearld Fenton, NP  CHIEF COMPLAINTS/REASON FOR VISIT:  metastatic colon cancer  HISTORY OF PRESENTING ILLNESS:   Chelsea [name] is a  76 y.o.  female with PMH listed below was seen in consultation at the request of  Jearld Fenton, NP  for evaluation of metastatic colon cancer.  Patient initially went to emergency room on 03/24/2021 for abdominal pain. 03/24/2021, CT scan of the abdomen showed marked bladder wall thickening with surrounding soft tissue stranding is noted.  Concerning for cystitis.  There is a lobulated mass between the posterior wall of the bladder and the rectum which appears to arise from the vaginal cuff.  This is indeterminate and difficult to characterize reflecting lack of IV contrast material.  Nonobstructing left renal calculus, left sacroiliitis, lumbar spondylosis aortic atherosclerosis. 03/24/2021 CT pelvic showed abdominal [place] soft tissue of right cecum, concerning for colon carcinoma.  Colonoscopy recommended.  Low-attenuation mass in the region of vaginal cuff is again noted.  Diffuse urinary bladder wall thickening and mild bilateral hydroureter possibly cystitis.  [place] 03/24/2021 Lobulated solid 3.8 cm mass confirmed by ultrasound. This is most compatible with a neoplasm but remains indeterminate as to the origin as the epicenter seems to be outside of both the vaginal cuff and the adjacent colon, while the lesion appears inseparable from both. Extensive echogenic debris within the distended urinary bladder.   03/26/2021, patient was seen by Dr. Theora Gianotti for evaluation of colonic/vaginal cuff pelvic mass.  Patient also was referred to gastroenterology for  colonoscopy.  03/26/2021 CEA 3.4.  CA125 9.7 04/04/2021, status post colonoscopy which showed a frond like /villous nonobstructing large mass was found in the cecum, this was biopsied.  Terminal ileum was briefly intubated and appeared normal.  Diverticulosis in the sigmoid colon.  The rectum, sigmoid colon, descending colon, transverse colon and ascending colon were normal.  Nonbleeding internal hemorrhoids. 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    Given that the biopsy may not be representative of the entire underlying lesion.  Patient was recommended to establish with surgeon for evaluation. Patient no showed for her surgery appointment and as well as her follow-up appointment with gastroenterology.  Per daughter, patient was having cervical spine surgery and she prioritized that over the colon surgery.  08/19/2021, patient establish care with surgery Dr. Christian Mate 08/21/2021, CT abdomen pelvis with contrast  showed soft tissue mass at the base of cecum measuring up to 5 cm, slightly increased in size.  Interval development of multiple low-density lesions throughout the liver highly suggestive for metastatic disease.  Complex mass in the region of the vaginal cuff measuring up to 5.2 cm, increased in size.  Right ovary also appears more prominent on the current examination.  Moderate large volume of stool throughout the colon.  Mild urinary bladder wall thickening.  Colonic diverticulosis without evidence of active diverticulitis.  Aortic atherosclerosis  09/04/2021, patient is status post left lobe liver lesion biopsy and pathology showed metastatic adenocarcinoma, compatible with colorectal primary.  She was referred to establish care with oncology. She was accompanied by her daughter today.  Patient walks with a walker.+ Night sweats + weight loss.  Denies any abdominal pain currently, melena, blood in the stool.  She is  currently being followed by home health  PT/OT following a previous spinal surgery  10/10/2020, status post Mediport placement. 10/20/2020, cycle one 5-FU/bevacizumab.  Foundation one liquid biopsy testing showed KRASG12V, APC TP53 TMB 4  12/22/2021 CT chest abdomen pelvis Decreased cecum mass, however increased size and size of metastatic lesions throughout the liver.  Interval enlargement of vaginal cuff mass. No mets in chest.   # During the interval, patient was seen by gynecology oncology Dr. Fransisca Connors.  Biopsy of the vaginal mass was not recommended. Patient's case was also discussed on multidisciplinary tumor board.  IR potentially can try a biopsy if patient is willing to. I had a discussion with patient's daughter over the phone prior to this visit.  We discussed about option of adding oxaliplatin to 5-FU/bevacizumab and continue treatment of colon cancer, repeat CT scan short-term and if progression, repeat biopsy versus proceeding with vaginal mass biopsy.  Daughter prefers proceeding with chemotherapy and defer biopsy.   01/07/22, FOLFOX/bev  01/08/2022 patient has had a second opinion at [facility] and was seen by Dr. Reynaldo Minium .  Dr. Reynaldo Minium agrees with the current treatment plan with FOLFOX/bevacizumab.  He has ordered guardant 360 to see if she may be eligible for clinical trial in the future.     INTERVAL HISTORY Chelsea [name] is a 76 y.o. female who has above history reviewed by me today presents for follow up visit for management of metastatic colon cancer Patient was accompanied by her son-in-law. + constipation, on laxative.  Patient feels better today.  Has gained some weight. Denies any shortness of breath, chest pain, nausea vomiting diarrhea today.  She has gained weight.  Review of Systems  Constitutional:  Positive for fatigue. Negative for appetite change, chills, fever and unexpected weight change.  HENT:   Negative for hearing loss and voice change.   Eyes:  Negative for eye problems.  Respiratory:   Negative for chest tightness and cough.   Cardiovascular:  Negative for chest pain.  Gastrointestinal:  Negative for abdominal distention, abdominal pain, blood in stool and diarrhea.  Endocrine: Negative for hot flashes.  Genitourinary:  Negative for difficulty urinating and frequency.   Musculoskeletal:  Positive for back pain. Negative for arthralgias.  Skin:  Negative for itching and rash.  Neurological:  Positive for numbness (chornic finger tips). Negative for extremity weakness.  Hematological:  Negative for adenopathy.  Psychiatric/Behavioral:  Negative for confusion.    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)    Moderate mitral insufficiency    Port-A-Cath in place    [facility]iz[facility])    Spinal stenosis    Ulcer    Urinary incontinence     SURGICAL HISTORY: Past Surgical History:  Procedure Laterality Date   ABDOMINAL HYSTERECTOMY     BACK SURGERY     due to polio   BREAST BIOPSY Bilateral    neg   BREAST BIOPSY Right 2011   neg/stereo   CARPAL TUNNEL RELEASE     CATARACT EXTRACTION Right 2020   COLONOSCOPY WITH PROPOFOL N/A 04/04/2021   Procedure: COLONOSCOPY WITH PROPOFOL;  Surgeon: Virgel Manifold, MD;  Location: [facility] ENDOSCOPY;  Service: Endoscopy;  Laterality: N/A;   EYE SURGERY     IR IMAGING GUIDED PORT INSERTION  10/09/2021    SOCIAL HISTORY:  Social History   Socioeconomic History   Marital status: Divorced    Spouse name: Not on file   Number of children: Not on file   Years of education: Not on file   Highest education level: Not on file  Occupational History   Not on file  Tobacco Use   Smoking status: Never   Smokeless tobacco: Never  Vaping Use   Vaping Use: Never used  Substance and Sexual Activity   Alcohol use: Never   Drug use: No   Sexual activity: Not Currently  Other Topics Concern    Not on file  Social History Narrative   Not on file   Social Determinants of Health   Financial Resource Strain: Low Risk    Difficulty of Paying Living Expenses: Not very hard  Food Insecurity: No Food Insecurity   Worried About Running Out of Food in the Last Year: Never true   Ran Out of Food in the Last Year: Never true  Transportation Needs: No Transportation Needs   Lack of Transportation (Medical): No   Lack of Transportation (Non-Medical): No  Physical Activity: Inactive   Days of Exercise per Week: 0 days   Minutes of Exercise per Session: 0 min  Stress: Stress Concern Present   Feeling of Stress : To some extent  Social Connections: Moderately Integrated   Frequency of Communication with Friends and Family: Three times a week   Frequency of Social Gatherings with Friends and Family: Three times a week   Attends Religious Services: 1 to 4 times per year   Active Member of Clubs or Organizations: No   Attends Archivist Meetings: 1 to 4 times per year   Marital Status: Widowed  Human resources officer Violence: Not At Risk   Fear of Current or Ex-Partner: No   Emotionally Abused: No   Physically Abused: No   Sexually Abused: No    FAMILY HISTORY: Family History  Problem Relation Age of Onset   Arthritis Mother    Heart disease Mother    Stroke Mother    Hypertension Mother    COPD Mother    Sudden death Sister    Other Father        unknown medical history   Breast cancer Neg Hx     ALLERGIES:  is allergic to penicillins.  MEDICATIONS:  Current Outpatient Medications  Medication Sig Dispense Refill   acetaminophen (TYLENOL) 500 MG tablet Take 500 mg by mouth every 6 (six) hours as needed.     ALPRAZolam (XANAX) 0.5 MG tablet TAKE 1 TABLET BY MOUTH TWICE A DAY AS NEEDED FOR ANXIETY 60 tablet 0   aspirin 325 MG tablet Take 325 mg by mouth daily.     atorvastatin (LIPITOR) 10 MG tablet TAKE 1 TABLET BY MOUTH EVERY DAY 90 tablet 0   busPIRone (BUSPAR)  5 MG tablet Take 1 tablet (5 mg total) by mouth 3 (three) times daily. 270 tablet 1   Cyanocobalamin (VITAMIN B 12 PO) Take 500 mcg by mouth daily.     diclofenac Sodium (VOLTAREN) 1 % GEL Apply 4 g topically 4 (four) times daily. 4 g 0   docusate sodium (COLACE) 100 MG capsule Take 1 capsule (100 mg total) by mouth 2 (two) times daily. 60 capsule 0   FLUoxetine (PROZAC) 20 MG capsule TAKE 3 CAPSULES (60 MG TOTAL) BY MOUTH EVERY MORNING. 270 capsule 0   gabapentin (NEURONTIN) 100 MG capsule TAKE 2 CAPSULES BY MOUTH 2 TIMES DAILY. [place]  capsule 0   KLOR-CON M20 20 MEQ tablet TAKE 1 TABLET BY MOUTH EVERY DAY 30 tablet 1   lidocaine (LIDODERM) 5 % Place 1 patch onto the skin daily. Remove & Discard patch within 12 hours or as directed by MD 1 patch 0   lidocaine-prilocaine (EMLA) cream Apply small amount of cream to port site 1-2 hour prior to chemo treatment. 30 g 3   loperamide (IMODIUM) 2 MG capsule TAKE 1 CAP BY MOUTH SEE ADMIN INSTRUCTIONS. INITIAL: 4 MG, FOLLOWED BY 2 MG AFTER EACH LOOSE STOOL MAXIMUM: 16 MG/DAY 60 capsule 0   loratadine (CLARITIN) 10 MG tablet Take 1 tablet (10 mg total) by mouth See admin instructions. Take 1 tablet daily for 4 days after each chemotherapy treatments. 30 tablet 2   meclizine (ANTIVERT) 12.5 MG tablet Take 1 tablet (12.5 mg total) by mouth 3 (three) times daily as needed for dizziness. 30 tablet 0   methocarbamol (ROBAXIN) 500 MG tablet TAKE 1 TABLET TWICE DAILY AS NEEDED FOR MUSCLE SPASM(S) 60 tablet 0   ondansetron (ZOFRAN) 8 MG tablet Take 1 tablet (8 mg total) by mouth 2 (two) times daily as needed (Nausea or vomiting). 30 tablet 1   polyethylene glycol powder (GLYCOLAX/MIRALAX) 17 GM/SCOOP powder Take 17 g by mouth daily. 3350 g 1   prochlorperazine (COMPAZINE) 10 MG tablet Take 1 tablet (10 mg total) by mouth every 6 (six) hours as needed (Nausea or vomiting). 30 tablet 1   triamterene-hydrochlorothiazide (MAXZIDE-25) 37.5-25 MG tablet TAKE 1 TABLET BY MOUTH  EVERY DAY 90 tablet 0   vitamin E 180 MG (400 UNITS) capsule Take 400 Units by mouth daily.     No current facility-administered medications for this visit.   Facility-Administered Medications Ordered in Other Visits  Medication Dose Route Frequency Provider Last Rate Last Admin   fluorouracil (ADRUCIL) 4,300 mg in sodium chloride 0.9 % 64 mL chemo infusion  2,400 mg/m2 (Order-Specific) Intravenous 1 day or 1 dose [name], MD   4,300 mg at 02/25/22 1250     PHYSICAL EXAMINATION: ECOG PERFORMANCE STATUS: 2 - Symptomatic, <50% confined to bed Vitals:   02/25/22 0842  BP: 116/74  Pulse: 65  Resp: 18  Temp: (!) 96.1 F (35.6 C)   Filed Weights   02/25/22 0842  Weight: 172 lb 12.8 oz (78.4 kg)     Physical Exam Constitutional:      General: She is not in acute distress.    Comments: Patient sits in wheelchair  HENT:     Head: Normocephalic and atraumatic.  Eyes:     General: No scleral icterus. Cardiovascular:     Rate and Rhythm: Normal rate and regular rhythm.     Heart sounds: Normal heart sounds.  Pulmonary:     Effort: Pulmonary effort is normal. No respiratory distress.     Breath sounds: No wheezing.  Abdominal:     General: Bowel sounds are normal. There is no distension.     Palpations: Abdomen is soft.  Musculoskeletal:        General: No deformity. Normal range of motion.     Cervical back: Normal range of motion and neck supple.  Skin:    General: Skin is warm and dry.     Findings: No erythema or rash.  Neurological:     Mental Status: She is alert and oriented to person, place, and time. Mental status is at baseline.     Cranial Nerves: No cranial nerve deficit.  Coordination: Coordination normal.  Psychiatric:        Mood and Affect: Mood normal.    LABORATORY DATA:  I have reviewed the data as listed Lab Results  Component Value Date   WBC 8.0 02/25/2022   HGB 10.2 (L) 02/25/2022   HCT 32.0 (L) 02/25/2022   MCV 102.6 (H) 02/25/2022    PLT 272 02/25/2022   Recent Labs    02/04/22 1012 02/18/22 0804 02/25/22 0802  NA 137 137 137  K 3.4* 3.6 3.7  CL 101 101 101  CO2 28 28 30   GLUCOSE 112* 102* 95  BUN 16 20 15   CREATININE 1.02* 1.05* 0.90  CALCIUM 9.1 9.0 9.0  GFRNONAA 57* 55* >60  PROT 7.4 7.4 7.2  ALBUMIN 3.5 3.6 3.6  AST 21 20 19   ALT 13 17 14   ALKPHOS 103 115 88  BILITOT 0.6 0.3 0.5    Iron/TIBC/Ferritin/ %Sat    Component Value Date/Time   FERRITIN 58 10/24/2020 0402       RADIOGRAPHIC STUDIES: I have personally reviewed the radiological images as listed and agreed with the findings in the report. DG Lumbar Spine Complete  Result Date: 02/20/2022 CLINICAL DATA:  Low back pain EXAM: LUMBAR SPINE - COMPLETE 4+ VIEW COMPARISON:  None Available. FINDINGS: Lumbar vertebral body height are preserved without evidence of fracture. Grade 1 anterolisthesis of L4 on L5. Moderate intervertebral disc space narrowing at L5-S1. Facet arthropathy throughout the lumbar spine. No spondylolysis identified. IMPRESSION: Degenerative changes of the lumbar spine with no acute fracture identified. Electronically Signed   By: Ofilia Neas M.D.   On: 02/20/2022 15:46   DG Shoulder Right  Result Date: 02/20/2022 CLINICAL DATA:  Right shoulder pain EXAM: RIGHT SHOULDER - 2+ VIEW COMPARISON:  Right shoulder x-ray 07/08/2019 FINDINGS: No acute fracture or dislocation identified. Moderate to severe narrowing of the glenohumeral joint with large inferior marginal osteophytes. Mild narrowing of the acromioclavicular joint. Small likely subcortical cysts at the greater tuberosity of the humerus. IMPRESSION: Degenerative changes of the shoulder as described. Electronically Signed   By: Ofilia Neas M.D.   On: 02/20/2022 15:45      ASSESSMENT & PLAN:  1. Metastatic colon cancer to liver ([place])   2. Encounter for antineoplastic chemotherapy   3. Weight loss   4. Hypokalemia   5. Neuropathy    Cancer Staging  Metastatic  colon cancer to liver [facility]) Staging form: Colon and Rectum - Neuroendocine Tumors, AJCC 8th Edition - Clinical stage from 09/16/2021: Stage IV (cTX, cNX, cM1) - Signed by [name], MD on 09/16/2021  #Metastatic colon cancer to liver, 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  No sufficient tissue for NGS Liquid biopsy showed KRAS12V, APC, TP53 mutation, TMB 4.  S/p 6 cycles of 5-FU /Bevacizumab--> CT showed mixed response--> FOLFOX/bevacizumab.  Labs reviewed and discussed with patient. Proceed with FOLFOX bevacizumab.  #Chemotherapy-induced neutropenia, patient gets prophylactic G-CSF with Udenyca. Claritin 10 mg daily for 4 days.   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  Shared decision was made to defer biopsy currently, proceed with treatment for metastatic colon cancer and repeat short-term CT for follow-up.  #Hypokalemia,  stable.  Continue potassium chloride 80mq daily.    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  Stable.  Continue gabapentin . Supportive care measures are necessary for patient well-being and will be provided as necessary. \All questions were answered. The patient knows to call the clinic with any problems questions or concerns.  cc  Jearld Fenton, NP   Follow-up lab MD 2 week FOLFOX/bevacizumab.  [name], MD, PhD 02/25/2022

## 2022-02-26 DIAGNOSIS — M4322 Fusion of spine, cervical region: Secondary | ICD-10-CM | POA: Diagnosis not present

## 2022-02-26 DIAGNOSIS — Z981 Arthrodesis status: Secondary | ICD-10-CM | POA: Diagnosis not present

## 2022-02-27 ENCOUNTER — Inpatient Hospital Stay: Payer: Medicare HMO | Admitting: Licensed Clinical Social Worker

## 2022-02-27 ENCOUNTER — Inpatient Hospital Stay: Payer: Medicare HMO

## 2022-02-27 DIAGNOSIS — Z5111 Encounter for antineoplastic chemotherapy: Secondary | ICD-10-CM | POA: Diagnosis not present

## 2022-02-27 DIAGNOSIS — Z5112 Encounter for antineoplastic immunotherapy: Secondary | ICD-10-CM | POA: Diagnosis not present

## 2022-02-27 DIAGNOSIS — D701 Agranulocytosis secondary to cancer chemotherapy: Secondary | ICD-10-CM | POA: Diagnosis not present

## 2022-02-27 DIAGNOSIS — C189 Malignant neoplasm of colon, unspecified: Secondary | ICD-10-CM

## 2022-02-27 DIAGNOSIS — E876 Hypokalemia: Secondary | ICD-10-CM | POA: Diagnosis not present

## 2022-02-27 DIAGNOSIS — Z79899 Other long term (current) drug therapy: Secondary | ICD-10-CM | POA: Diagnosis not present

## 2022-02-27 DIAGNOSIS — M25511 Pain in right shoulder: Secondary | ICD-10-CM | POA: Diagnosis not present

## 2022-02-27 DIAGNOSIS — C787 Secondary malignant neoplasm of liver and intrahepatic bile duct: Secondary | ICD-10-CM | POA: Diagnosis not present

## 2022-02-27 MED ORDER — HEPARIN SOD (PORK) LOCK FLUSH 100 UNIT/ML IV SOLN
INTRAVENOUS | Status: AC
  Filled 2022-02-27: qty 5

## 2022-02-27 MED ORDER — HEPARIN SOD (PORK) LOCK FLUSH 100 UNIT/ML IV SOLN
500.0000 [IU] | Freq: Once | INTRAVENOUS | Status: AC | PRN
  Administered 2022-02-27: 500 [IU]
  Filled 2022-02-27: qty 5

## 2022-02-27 MED ORDER — PEGFILGRASTIM-CBQV 6 MG/0.6ML ~~LOC~~ SOSY
6.0000 mg | PREFILLED_SYRINGE | Freq: Once | SUBCUTANEOUS | Status: AC
  Administered 2022-02-27: 6 mg via SUBCUTANEOUS
  Filled 2022-02-27: qty 0.6

## 2022-02-27 MED ORDER — SODIUM CHLORIDE 0.9% FLUSH
10.0000 mL | INTRAVENOUS | Status: DC | PRN
  Administered 2022-02-27: 10 mL
  Filled 2022-02-27: qty 10

## 2022-03-10 ENCOUNTER — Other Ambulatory Visit: Payer: Self-pay | Admitting: Internal Medicine

## 2022-03-10 ENCOUNTER — Inpatient Hospital Stay: Payer: Medicare HMO | Attending: Oncology

## 2022-03-10 DIAGNOSIS — Z5112 Encounter for antineoplastic immunotherapy: Secondary | ICD-10-CM | POA: Insufficient documentation

## 2022-03-10 DIAGNOSIS — C189 Malignant neoplasm of colon, unspecified: Secondary | ICD-10-CM | POA: Insufficient documentation

## 2022-03-10 DIAGNOSIS — C787 Secondary malignant neoplasm of liver and intrahepatic bile duct: Secondary | ICD-10-CM | POA: Insufficient documentation

## 2022-03-10 DIAGNOSIS — D701 Agranulocytosis secondary to cancer chemotherapy: Secondary | ICD-10-CM | POA: Insufficient documentation

## 2022-03-10 DIAGNOSIS — T451X5A Adverse effect of antineoplastic and immunosuppressive drugs, initial encounter: Secondary | ICD-10-CM | POA: Insufficient documentation

## 2022-03-10 DIAGNOSIS — Z79899 Other long term (current) drug therapy: Secondary | ICD-10-CM | POA: Insufficient documentation

## 2022-03-10 DIAGNOSIS — Z5111 Encounter for antineoplastic chemotherapy: Secondary | ICD-10-CM | POA: Insufficient documentation

## 2022-03-10 DIAGNOSIS — F411 Generalized anxiety disorder: Secondary | ICD-10-CM

## 2022-03-10 NOTE — Telephone Encounter (Signed)
Requested medication (s) are due for refill today: yes  Requested medication (s) are on the active medication list: yes  Last refill:  02/03/22 #60/0  Future visit scheduled: yes  Notes to clinic:  Unable to refill per protocol, cannot delegate.    Requested Prescriptions  Pending Prescriptions Disp Refills   ALPRAZolam (XANAX) 0.5 MG tablet [Pharmacy Med Name: ALPRAZOLAM 0.5 MG TABLET] 60 tablet 0    Sig: TAKE 1 TABLET BY MOUTH TWICE A DAY AS NEEDED FOR ANXIETY     Not Delegated - Psychiatry: Anxiolytics/Hypnotics 2 Failed - 03/10/2022 12:25 PM      Failed - This refill cannot be delegated      Failed - Urine Drug Screen completed in last 360 days      Passed - Patient is not pregnant      Passed - Valid encounter within last 6 months    Recent Outpatient Visits           2 weeks ago Right leg weakness   [place], Coralie Keens, NP   4 months ago Pure hypercholesterolemia   [place], Coralie Keens, NP   6 months ago 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 [facility])   [facility] [place], Coralie Keens, NP   7 months ago [facility] [place], Coralie Keens, NP   9 months ago Cervical myelopathy [facility])   [facility], Coralie Keens, NP       Future Appointments             In 1 month [place], Coralie Keens, NP [facility], [facility]

## 2022-03-10 NOTE — Progress Notes (Signed)
Nutrition  Called patient for scheduled telephone visit but no answer.  Left message with contact information requesting a return call.  Kailie Polus B. Zenia Resides, [place], [place] Registered Dietitian [phone]

## 2022-03-11 ENCOUNTER — Encounter: Payer: Self-pay | Admitting: Oncology

## 2022-03-11 ENCOUNTER — Inpatient Hospital Stay: Payer: Medicare HMO

## 2022-03-11 ENCOUNTER — Inpatient Hospital Stay (HOSPITAL_BASED_OUTPATIENT_CLINIC_OR_DEPARTMENT_OTHER): Payer: Medicare HMO | Admitting: Oncology

## 2022-03-11 VITALS — BP 105/65 | HR 72 | Temp 96.8°F | Resp 18 | Wt 171.1 lb

## 2022-03-11 DIAGNOSIS — E876 Hypokalemia: Secondary | ICD-10-CM

## 2022-03-11 DIAGNOSIS — G629 Polyneuropathy, unspecified: Secondary | ICD-10-CM

## 2022-03-11 DIAGNOSIS — C787 Secondary malignant neoplasm of liver and intrahepatic bile duct: Secondary | ICD-10-CM

## 2022-03-11 DIAGNOSIS — N898 Other specified noninflammatory disorders of vagina: Secondary | ICD-10-CM

## 2022-03-11 DIAGNOSIS — C189 Malignant neoplasm of colon, unspecified: Secondary | ICD-10-CM

## 2022-03-11 DIAGNOSIS — Z5111 Encounter for antineoplastic chemotherapy: Secondary | ICD-10-CM | POA: Diagnosis not present

## 2022-03-11 DIAGNOSIS — T451X5A Adverse effect of antineoplastic and immunosuppressive drugs, initial encounter: Secondary | ICD-10-CM | POA: Diagnosis not present

## 2022-03-11 DIAGNOSIS — Z79899 Other long term (current) drug therapy: Secondary | ICD-10-CM | POA: Diagnosis not present

## 2022-03-11 DIAGNOSIS — Z5112 Encounter for antineoplastic immunotherapy: Secondary | ICD-10-CM | POA: Diagnosis not present

## 2022-03-11 DIAGNOSIS — D701 Agranulocytosis secondary to cancer chemotherapy: Secondary | ICD-10-CM | POA: Diagnosis not present

## 2022-03-11 LAB — CBC WITH DIFFERENTIAL/PLATELET
Abs Immature Granulocytes: 0.18 10*3/uL — ABNORMAL HIGH (ref 0.00–0.07)
Basophils Absolute: 0 10*3/uL (ref 0.0–0.1)
Basophils Relative: 0 %
Eosinophils Absolute: 0.2 10*3/uL (ref 0.0–0.5)
Eosinophils Relative: 2 %
HCT: 33.5 % — ABNORMAL LOW (ref 36.0–46.0)
Hemoglobin: 10.3 g/dL — ABNORMAL LOW (ref 12.0–15.0)
Immature Granulocytes: 2 %
Lymphocytes Relative: 19 %
Lymphs Abs: 2.2 10*3/uL (ref 0.7–4.0)
MCH: 32.1 pg (ref 26.0–34.0)
MCHC: 30.7 g/dL (ref 30.0–36.0)
MCV: 104.4 fL — ABNORMAL HIGH (ref 80.0–100.0)
Monocytes Absolute: 1.2 10*3/uL — ABNORMAL HIGH (ref 0.1–1.0)
Monocytes Relative: 10 %
Neutro Abs: 8.3 10*3/uL — ABNORMAL HIGH (ref 1.7–7.7)
Neutrophils Relative %: 67 %
Platelets: 179 10*3/uL (ref 150–400)
RBC: 3.21 MIL/uL — ABNORMAL LOW (ref 3.87–5.11)
RDW: 18.3 % — ABNORMAL HIGH (ref 11.5–15.5)
WBC: 12.1 10*3/uL — ABNORMAL HIGH (ref 4.0–10.5)
nRBC: 0.2 % (ref 0.0–0.2)

## 2022-03-11 LAB — COMPREHENSIVE METABOLIC PANEL
ALT: 13 U/L (ref 0–44)
AST: 21 U/L (ref 15–41)
Albumin: 3.4 g/dL — ABNORMAL LOW (ref 3.5–5.0)
Alkaline Phosphatase: 119 U/L (ref 38–126)
Anion gap: 6 (ref 5–15)
BUN: 15 mg/dL (ref 8–23)
CO2: 31 mmol/L (ref 22–32)
Calcium: 8.8 mg/dL — ABNORMAL LOW (ref 8.9–10.3)
Chloride: 101 mmol/L (ref 98–111)
Creatinine, Ser: 0.76 mg/dL (ref 0.44–1.00)
GFR, Estimated: >60 mL/min (ref >60)
Glucose, Bld: 114 mg/dL — ABNORMAL HIGH (ref 70–99)
Potassium: 3.4 mmol/L — ABNORMAL LOW (ref 3.5–5.1)
Sodium: 138 mmol/L (ref 135–145)
Total Bilirubin: 0.3 mg/dL (ref 0.3–1.2)
Total Protein: 7.2 g/dL (ref 6.5–8.1)

## 2022-03-11 LAB — PROTEIN, URINE, RANDOM: Total Protein, Urine: 10 mg/dL

## 2022-03-11 MED ORDER — GABAPENTIN 300 MG PO CAPS: 300.0000 mg | ORAL_CAPSULE | Freq: Two times a day (BID) | ORAL | 1 refills | Status: DC

## 2022-03-11 MED ORDER — SODIUM CHLORIDE 0.9 % IV SOLN
2400.0000 mg/m2 | INTRAVENOUS | Status: DC
  Administered 2022-03-11: 4300 mg via INTRAVENOUS
  Filled 2022-03-11: qty 86

## 2022-03-11 MED ORDER — LEUCOVORIN CALCIUM INJECTION 350 MG
700.0000 mg | Freq: Once | INTRAVENOUS | Status: AC
  Administered 2022-03-11: 700 mg via INTRAVENOUS
  Filled 2022-03-11: qty 17.5

## 2022-03-11 MED ORDER — PROCHLORPERAZINE MALEATE 10 MG PO TABS
10.0000 mg | ORAL_TABLET | Freq: Once | ORAL | Status: AC
  Administered 2022-03-11: 10 mg via ORAL
  Filled 2022-03-11: qty 1

## 2022-03-11 MED ORDER — PALONOSETRON HCL INJECTION 0.25 MG/5ML
0.2500 mg | Freq: Once | INTRAVENOUS | Status: AC
  Administered 2022-03-11: 0.25 mg via INTRAVENOUS
  Filled 2022-03-11: qty 5

## 2022-03-11 MED ORDER — OXALIPLATIN CHEMO INJECTION 100 MG/20ML
65.0000 mg/m2 | Freq: Once | INTRAVENOUS | Status: AC
  Administered 2022-03-11: 120 mg via INTRAVENOUS
  Filled 2022-03-11: qty 20

## 2022-03-11 MED ORDER — DEXTROSE 5 % IV SOLN
INTRAVENOUS | Status: DC
  Filled 2022-03-11: qty 250

## 2022-03-11 MED ORDER — SODIUM CHLORIDE 0.9 % IV SOLN
5.0000 mg/kg | Freq: Once | INTRAVENOUS | Status: AC
  Administered 2022-03-11: 400 mg via INTRAVENOUS
  Filled 2022-03-11: qty 16

## 2022-03-11 NOTE — Progress Notes (Signed)
Pt here for follow up. No new concerns voiced.   

## 2022-03-11 NOTE — Progress Notes (Signed)
Hematology/Oncology Progress note [phone] [phone]         Patient Care Team: Jearld Fenton, NP as PCP - General (Internal Medicine) Clent Jacks, RN as Oncology Nurse Navigator [name], MD as Consulting Physician (Oncology)  REFERRING PROVIDER: Jearld Fenton, NP  CHIEF COMPLAINTS/REASON FOR VISIT:  metastatic colon cancer  HISTORY OF PRESENTING ILLNESS:   Chelsea [name] is a  76 y.o.  female with PMH listed below was seen in consultation at the request of  Jearld Fenton, NP  for evaluation of metastatic colon cancer.  Patient initially went to emergency room on 03/24/2021 for abdominal pain. 03/24/2021, CT scan of the abdomen showed marked bladder wall thickening with surrounding soft tissue stranding is noted.  Concerning for cystitis.  There is a lobulated mass between the posterior wall of the bladder and the rectum which appears to arise from the vaginal cuff.  This is indeterminate and difficult to characterize reflecting lack of IV contrast material.  Nonobstructing left renal calculus, left sacroiliitis, lumbar spondylosis aortic atherosclerosis. 03/24/2021 CT pelvic showed abdominal [place] soft tissue of right cecum, concerning for colon carcinoma.  Colonoscopy recommended.  Low-attenuation mass in the region of vaginal cuff is again noted.  Diffuse urinary bladder wall thickening and mild bilateral hydroureter possibly cystitis.  [place] 03/24/2021 Lobulated solid 3.8 cm mass confirmed by ultrasound. This is most compatible with a neoplasm but remains indeterminate as to the origin as the epicenter seems to be outside of both the vaginal cuff and the adjacent colon, while the lesion appears inseparable from both. Extensive echogenic debris within the distended urinary bladder.   03/26/2021, patient was seen by Dr. Theora Gianotti for evaluation of colonic/vaginal cuff pelvic mass.  Patient also was referred to gastroenterology for  colonoscopy.  03/26/2021 CEA 3.4.  CA125 9.7 04/04/2021, status post colonoscopy which showed a frond like /villous nonobstructing large mass was found in the cecum, this was biopsied.  Terminal ileum was briefly intubated and appeared normal.  Diverticulosis in the sigmoid colon.  The rectum, sigmoid colon, descending colon, transverse colon and ascending colon were normal.  Nonbleeding internal hemorrhoids. 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    Given that the biopsy may not be representative of the entire underlying lesion.  Patient was recommended to establish with surgeon for evaluation. Patient no showed for her surgery appointment and as well as her follow-up appointment with gastroenterology.  Per daughter, patient was having cervical spine surgery and she prioritized that over the colon surgery.  08/19/2021, patient establish care with surgery Dr. Christian Mate 08/21/2021, CT abdomen pelvis with contrast  showed soft tissue mass at the base of cecum measuring up to 5 cm, slightly increased in size.  Interval development of multiple low-density lesions throughout the liver highly suggestive for metastatic disease.  Complex mass in the region of the vaginal cuff measuring up to 5.2 cm, increased in size.  Right ovary also appears more prominent on the current examination.  Moderate large volume of stool throughout the colon.  Mild urinary bladder wall thickening.  Colonic diverticulosis without evidence of active diverticulitis.  Aortic atherosclerosis  09/04/2021, patient is status post left lobe liver lesion biopsy and pathology showed metastatic adenocarcinoma, compatible with colorectal primary.  She was referred to establish care with oncology. She was accompanied by her daughter today.  Patient walks with a walker.+ Night sweats + weight loss.  Denies any abdominal pain currently, melena, blood in the stool.  She is  currently being followed by home health  PT/OT following a previous spinal surgery  10/10/2020, status post Mediport placement. 10/20/2020, cycle one 5-FU/bevacizumab.  Foundation one liquid biopsy testing showed KRASG12V, APC TP53 TMB 4  12/22/2021 CT chest abdomen pelvis Decreased cecum mass, however increased size and size of metastatic lesions throughout the liver.  Interval enlargement of vaginal cuff mass. No mets in chest.   # During the interval, patient was seen by gynecology oncology Dr. Fransisca Connors.  Biopsy of the vaginal mass was not recommended. Patient's case was also discussed on multidisciplinary tumor board.  IR potentially can try a biopsy if patient is willing to. I had a discussion with patient's daughter over the phone prior to this visit.  We discussed about option of adding oxaliplatin to 5-FU/bevacizumab and continue treatment of colon cancer, repeat CT scan short-term and if progression, repeat biopsy versus proceeding with vaginal mass biopsy.  Daughter prefers proceeding with chemotherapy and defer biopsy.   01/07/22, FOLFOX/bev  01/08/2022 patient has had a second opinion at [facility] and was seen by Dr. Reynaldo Minium .  Dr. Reynaldo Minium agrees with the current treatment plan with FOLFOX/bevacizumab.  He has ordered guardant 360 to see if she may be eligible for clinical trial in the future.     INTERVAL HISTORY Chelsea [name] is a 76 y.o. female who has above history reviewed by me today presents for follow up visit for management of metastatic colon cancer Patient was accompanied by her daughter Patient feels well at baseline. Patient has pre-existing neuropathy, right hand, getting worse on current chemotherapy.  She currently takes gabapentin 200 mg twice daily. Denies any shortness of breath, chest pain, nausea vomiting diarrhea today.  Weight is stable.  Review of Systems  Constitutional:  Positive for fatigue. Negative for appetite change, chills, fever and unexpected weight change.  HENT:   Negative for  hearing loss and voice change.   Eyes:  Negative for eye problems.  Respiratory:  Negative for chest tightness and cough.   Cardiovascular:  Negative for chest pain.  Gastrointestinal:  Negative for abdominal distention, abdominal pain, blood in stool and diarrhea.  Endocrine: Negative for hot flashes.  Genitourinary:  Negative for difficulty urinating and frequency.   Musculoskeletal:  Positive for back pain. Negative for arthralgias.  Skin:  Negative for itching and rash.  Neurological:  Positive for numbness (chornic finger tips). Negative for extremity weakness.  Hematological:  Negative for adenopathy.  Psychiatric/Behavioral:  Negative for confusion.    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)    Moderate mitral insufficiency    Port-A-Cath in place    Seizures [facility])    Spinal stenosis    Ulcer    Urinary incontinence     SURGICAL HISTORY: Past Surgical History:  Procedure Laterality Date   ABDOMINAL HYSTERECTOMY     BACK SURGERY     due to polio   BREAST BIOPSY Bilateral    neg   BREAST BIOPSY Right 2011   neg/stereo   CARPAL TUNNEL RELEASE     CATARACT EXTRACTION Right 2020   COLONOSCOPY WITH PROPOFOL N/A 04/04/2021   Procedure: COLONOSCOPY WITH PROPOFOL;  Surgeon: Virgel Manifold, MD;  Location: [facility] ENDOSCOPY;  Service: Endoscopy;  Laterality: N/A;   EYE SURGERY     IR IMAGING  GUIDED PORT INSERTION  10/09/2021    SOCIAL HISTORY: Social History   Socioeconomic History   Marital status: Divorced    Spouse name: Not on file   Number of children: Not on file   Years of education: Not on file   Highest education level: Not on file  Occupational History   Not on file  Tobacco Use   Smoking status: Never   Smokeless tobacco: Never  Vaping Use   Vaping Use: Never used  Substance and Sexual Activity   Alcohol  use: Never   Drug use: No   Sexual activity: Not Currently  Other Topics Concern   Not on file  Social History Narrative   Not on file   Social Determinants of Health   Financial Resource Strain: Low Risk    Difficulty of Paying Living Expenses: Not very hard  Food Insecurity: No Food Insecurity   Worried About Running Out of Food in the Last Year: Never true   Ran Out of Food in the Last Year: Never true  Transportation Needs: No Transportation Needs   Lack of Transportation (Medical): No   Lack of Transportation (Non-Medical): No  Physical Activity: Inactive   Days of Exercise per Week: 0 days   Minutes of Exercise per Session: 0 min  Stress: Stress Concern Present   Feeling of Stress : To some extent  Social Connections: Moderately Integrated   Frequency of Communication with Friends and Family: Three times a week   Frequency of Social Gatherings with Friends and Family: Three times a week   Attends Religious Services: 1 to 4 times per year   Active Member of Clubs or Organizations: No   Attends Archivist Meetings: 1 to 4 times per year   Marital Status: Widowed  Human resources officer Violence: Not At Risk   Fear of Current or Ex-Partner: No   Emotionally Abused: No   Physically Abused: No   Sexually Abused: No    FAMILY HISTORY: Family History  Problem Relation Age of Onset   Arthritis Mother    Heart disease Mother    Stroke Mother    Hypertension Mother    COPD Mother    Sudden death Sister    Other Father        unknown medical history   Breast cancer Neg Hx     ALLERGIES:  is allergic to penicillins.  MEDICATIONS:  Current Outpatient Medications  Medication Sig Dispense Refill   acetaminophen (TYLENOL) 500 MG tablet Take 500 mg by mouth every 6 (six) hours as needed.     ALPRAZolam (XANAX) 0.5 MG tablet TAKE 1 TABLET BY MOUTH TWICE A DAY AS NEEDED FOR ANXIETY 60 tablet 0   aspirin 325 MG tablet Take 325 mg by mouth daily.     atorvastatin  (LIPITOR) 10 MG tablet TAKE 1 TABLET BY MOUTH EVERY DAY 90 tablet 0   busPIRone (BUSPAR) 5 MG tablet Take 1 tablet (5 mg total) by mouth 3 (three) times daily. 270 tablet 1   Cyanocobalamin (VITAMIN B 12 PO) Take 500 mcg by mouth daily.     diclofenac Sodium (VOLTAREN) 1 % GEL Apply 4 g topically 4 (four) times daily. 4 g 0   docusate sodium (COLACE) 100 MG capsule Take 1 capsule (100 mg total) by mouth 2 (two) times daily. 60 capsule 0   FLUoxetine (PROZAC) 20 MG capsule TAKE 3 CAPSULES (60 MG TOTAL) BY MOUTH EVERY MORNING. 270 capsule 0   gabapentin (NEURONTIN) 100 MG  capsule TAKE 2 CAPSULES BY MOUTH 2 TIMES DAILY. 40 capsule 0   KLOR-CON M20 20 MEQ tablet TAKE 1 TABLET BY MOUTH EVERY DAY 30 tablet 1   lidocaine (LIDODERM) 5 % Place 1 patch onto the skin daily. Remove & Discard patch within 12 hours or as directed by MD 1 patch 0   lidocaine-prilocaine (EMLA) cream Apply small amount of cream to port site 1-2 hour prior to chemo treatment. 30 g 3   loperamide (IMODIUM) 2 MG capsule TAKE 1 CAP BY MOUTH SEE ADMIN INSTRUCTIONS. INITIAL: 4 MG, FOLLOWED BY 2 MG AFTER EACH LOOSE STOOL MAXIMUM: 16 MG/DAY 60 capsule 0   loratadine (CLARITIN) 10 MG tablet Take 1 tablet (10 mg total) by mouth See admin instructions. Take 1 tablet daily for 4 days after each chemotherapy treatments. 30 tablet 2   meclizine (ANTIVERT) 12.5 MG tablet Take 1 tablet (12.5 mg total) by mouth 3 (three) times daily as needed for dizziness. 30 tablet 0   methocarbamol (ROBAXIN) 500 MG tablet TAKE 1 TABLET TWICE DAILY AS NEEDED FOR MUSCLE SPASM(S) 60 tablet 0   ondansetron (ZOFRAN) 8 MG tablet Take 1 tablet (8 mg total) by mouth 2 (two) times daily as needed (Nausea or vomiting). 30 tablet 1   polyethylene glycol powder (GLYCOLAX/MIRALAX) 17 GM/SCOOP powder Take 17 g by mouth daily. 3350 g 1   prochlorperazine (COMPAZINE) 10 MG tablet Take 1 tablet (10 mg total) by mouth every 6 (six) hours as needed (Nausea or vomiting). 30 tablet 1    triamterene-hydrochlorothiazide (MAXZIDE-25) 37.5-25 MG tablet TAKE 1 TABLET BY MOUTH EVERY DAY 90 tablet 0   vitamin E 180 MG (400 UNITS) capsule Take 400 Units by mouth daily.     No current facility-administered medications for this visit.     PHYSICAL EXAMINATION: ECOG PERFORMANCE STATUS: 2 - Symptomatic, <50% confined to bed There were no vitals filed for this visit.  There were no vitals filed for this visit.    Physical Exam Constitutional:      General: She is not in acute distress.    Comments: Patient sits in wheelchair  HENT:     Head: Normocephalic and atraumatic.  Eyes:     General: No scleral icterus. Cardiovascular:     Rate and Rhythm: Normal rate and regular rhythm.     Heart sounds: Normal heart sounds.  Pulmonary:     Effort: Pulmonary effort is normal. No respiratory distress.     Breath sounds: No wheezing.  Abdominal:     General: Bowel sounds are normal. There is no distension.     Palpations: Abdomen is soft.  Musculoskeletal:        General: No deformity. Normal range of motion.     Cervical back: Normal range of motion and neck supple.  Skin:    General: Skin is warm and dry.     Findings: No erythema or rash.  Neurological:     Mental Status: She is alert and oriented to person, place, and time. Mental status is at baseline.     Cranial Nerves: No cranial nerve deficit.     Coordination: Coordination normal.  Psychiatric:        Mood and Affect: Mood normal.    LABORATORY DATA:  I have reviewed the data as listed Lab Results  Component Value Date   WBC 8.0 02/25/2022   HGB 10.2 (L) 02/25/2022   HCT 32.0 (L) 02/25/2022   MCV 102.6 (H) 02/25/2022   PLT 272  02/25/2022   Recent Labs    02/04/22 1012 02/18/22 0804 02/25/22 0802  NA 137 137 137  K 3.4* 3.6 3.7  CL 101 101 101  CO2 28 28 30   GLUCOSE 112* 102* 95  BUN 16 20 15   CREATININE 1.02* 1.05* 0.90  CALCIUM 9.1 9.0 9.0  GFRNONAA 57* 55* >60  PROT 7.4 7.4 7.2  ALBUMIN  3.5 3.6 3.6  AST 21 20 19   ALT 13 17 14   ALKPHOS 103 115 88  BILITOT 0.6 0.3 0.5    Iron/TIBC/Ferritin/ %Sat    Component Value Date/Time   FERRITIN 58 10/24/2020 0402       RADIOGRAPHIC STUDIES: I have personally reviewed the radiological images as listed and agreed with the findings in the report. DG Lumbar Spine Complete  Result Date: 02/20/2022 CLINICAL DATA:  Low back pain EXAM: LUMBAR SPINE - COMPLETE 4+ VIEW COMPARISON:  None Available. FINDINGS: Lumbar vertebral body height are preserved without evidence of fracture. Grade 1 anterolisthesis of L4 on L5. Moderate intervertebral disc space narrowing at L5-S1. Facet arthropathy throughout the lumbar spine. No spondylolysis identified. IMPRESSION: Degenerative changes of the lumbar spine with no acute fracture identified. Electronically Signed   By: Ofilia Neas M.D.   On: 02/20/2022 15:46   DG Shoulder Right  Result Date: 02/20/2022 CLINICAL DATA:  Right shoulder pain EXAM: RIGHT SHOULDER - 2+ VIEW COMPARISON:  Right shoulder x-ray 07/08/2019 FINDINGS: No acute fracture or dislocation identified. Moderate to severe narrowing of the glenohumeral joint with large inferior marginal osteophytes. Mild narrowing of the acromioclavicular joint. Small likely subcortical cysts at the greater tuberosity of the humerus. IMPRESSION: Degenerative changes of the shoulder as described. Electronically Signed   By: Ofilia Neas M.D.   On: 02/20/2022 15:45      ASSESSMENT & PLAN:  1. Metastatic colon cancer to liver ([place])   2. Encounter for antineoplastic chemotherapy   3. Hypokalemia   4. Neuropathy   5. Vaginal mass    Cancer Staging  Metastatic colon cancer to liver [facility]) Staging form: Colon and Rectum - Neuroendocine Tumors, AJCC 8th Edition - Clinical stage from 09/16/2021: Stage IV (cTX, cNX, cM1) - Signed by [name], MD on 09/16/2021  #Metastatic colon cancer to liver, 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  No sufficient tissue for NGS Liquid  biopsy showed KRAS12V, APC, TP53 mutation, TMB 4.  S/p 6 cycles of 5-FU /Bevacizumab--> CT showed mixed response--> FOLFOX/bevacizumab.  Labs reviewed and discussed with patient Proceed with FOLFOX bevacizumab today. Recommend to repeat CT scan for evaluation of treatment response.  Patient prefers to do CT scan in July.  Will schedule.  #Chemotherapy-induced neutropenia, patient gets prophylactic G-CSF with Udenyca on day 3.  Claritin 10 mg daily for 4 days.   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  Shared decision was made to defer biopsy currently, proceed with treatment for metastatic colon cancer and repeat short-term CT for follow-up.  #Hypokalemia, 3.4 .  Continue potassium chloride 59mq daily.    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  . Symptoms are worse on chemotherapy. I recommend to increase gabapentin to 300 mg twice daily.  She agrees with the plan.  Prescription was sent to pharmacy.   Supportive care measures are necessary for patient well-being and will be provided as necessary. \All questions were answered. The patient knows to call the clinic with any problems questions or concerns.  cc BJearld Fenton NP   Follow-up lab MD 2  week FOLFOX/bevacizumab.  [name], MD, PhD 03/11/2022

## 2022-03-11 NOTE — Progress Notes (Signed)
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  Patient receiving folfox and bevacizumab.    Met with patient during infusion as unable to reach by phone yesterday.  Patient reports that appetite is up and down.  Usually does not eat much during the evening.  Likes ensure/boost shakes.  Had ensure shake this am before coming to clinic.  Lives in assisted living facility and meals are prepared for her.  Also has a caregiver that brings her foods (catfish).      Medications: reviewed  Labs: reviewed  Anthropometrics:   Weight 171 lb today  178 lb on 4/19 174 lb on 3/22 178 lb on 1/11  4% weight loss in the last month and half, concerning  NUTRITION DIAGNOSIS: Inadequate oral intake related to cancer related treatment side effects as evidenced by 4% weight loss in the last month and half and up and down appetite    INTERVENTION:  Continue ensure/boost shakes for added nutrition Encouraged protein source with every meal/snack Discussed easy to prepare snacks to keep on hand when does not feel like eating much    MONITORING, EVALUATION, GOAL: weight trends, intake   NEXT VISIT: ~4-6 weeks with treatment  Lannah Koike B. Zenia Resides, [place], [place] Registered Dietitian [phone]

## 2022-03-11 NOTE — Patient Instructions (Signed)
[facility] [place]  Discharge Instructions: ?Thank you for choosing [facility] to provide your oncology and hematology care.  ?If you have a lab appointment with the Cancer Center, please go directly to the Cancer Center and check in at the registration area. ? ?Wear comfortable clothing and clothing appropriate for easy access to any Portacath or PICC line.  ? ?We strive to give you quality time with your provider. You may need to reschedule your appointment if you arrive late (15 or more minutes).  Arriving late affects you and other patients whose appointments are after yours.  Also, if you miss three or more appointments without notifying the office, you may be dismissed from the clinic at the provider?s discretion.    ?  ?For prescription refill requests, have your pharmacy contact our office and allow 72 hours for refills to be completed.   ? ?  ?To help prevent nausea and vomiting after your treatment, we encourage you to take your nausea medication as directed. ? ?BELOW ARE SYMPTOMS THAT SHOULD BE REPORTED IMMEDIATELY: ?*FEVER GREATER THAN 100.4 F (38 ?C) OR HIGHER ?*CHILLS OR SWEATING ?*NAUSEA AND VOMITING THAT IS NOT CONTROLLED WITH YOUR NAUSEA MEDICATION ?*UNUSUAL SHORTNESS OF BREATH ?*UNUSUAL BRUISING OR BLEEDING ?*URINARY PROBLEMS (pain or burning when urinating, or frequent urination) ?*BOWEL PROBLEMS (unusual diarrhea, constipation, pain near the anus) ?TENDERNESS IN MOUTH AND THROAT WITH OR WITHOUT PRESENCE OF ULCERS (sore throat, sores in mouth, or a toothache) ?UNUSUAL RASH, SWELLING OR PAIN  ?UNUSUAL VAGINAL DISCHARGE OR ITCHING  ? ?Items with * indicate a potential emergency and should be followed up as soon as possible or go to the Emergency Department if any problems should occur. ? ?Please show the CHEMOTHERAPY ALERT CARD or IMMUNOTHERAPY ALERT CARD at check-in to the Emergency Department and triage nurse. ? ?Should you have questions after your visit  or need to cancel or reschedule your appointment, please contact [facility] [place]  [phone] and follow the prompts.  Office hours are 8:00 a.m. to 4:30 p.m. Monday - Friday. Please note that voicemails left after 4:00 p.m. may not be returned until the following business day.  We are closed weekends and major holidays. You have access to a nurse at all times for urgent questions. Please call the main number to the clinic [phone] and follow the prompts. ? ?For any non-urgent questions, you may also contact your provider using MyChart. We now offer e-Visits for anyone 18 and older to request care online for non-urgent symptoms. For details visit [email or web address]. ?  ?Also download the MyChart app! Go to the app store, search "MyChart", open the app, select [place], and log in with your MyChart username and password. ? ?Due to Covid, a mask is required upon entering the hospital/clinic. If you do not have a mask, one will be given to you upon arrival. For doctor visits, patients may have 1 support person aged 18 or older with them. For treatment visits, patients cannot have anyone with them due to current Covid guidelines and our immunocompromised population.  ?

## 2022-03-11 NOTE — Progress Notes (Signed)
Per Dr. Tasia Catchings, ok to use urine protein results from 5/24 to start treatment today.

## 2022-03-12 ENCOUNTER — Telehealth: Payer: Self-pay

## 2022-03-12 LAB — CEA: CEA: 2.6 ng/mL (ref 0.0–4.7)

## 2022-03-12 NOTE — Telephone Encounter (Signed)
315 pm.  Phone call made to patient to complete a telephonic visit and offer a home visit.  No answer.  Message left requesting a call back.

## 2022-03-13 ENCOUNTER — Inpatient Hospital Stay: Payer: Medicare HMO

## 2022-03-13 DIAGNOSIS — T451X5A Adverse effect of antineoplastic and immunosuppressive drugs, initial encounter: Secondary | ICD-10-CM | POA: Diagnosis not present

## 2022-03-13 DIAGNOSIS — D701 Agranulocytosis secondary to cancer chemotherapy: Secondary | ICD-10-CM | POA: Diagnosis not present

## 2022-03-13 DIAGNOSIS — C189 Malignant neoplasm of colon, unspecified: Secondary | ICD-10-CM | POA: Diagnosis not present

## 2022-03-13 DIAGNOSIS — C787 Secondary malignant neoplasm of liver and intrahepatic bile duct: Secondary | ICD-10-CM | POA: Diagnosis not present

## 2022-03-13 DIAGNOSIS — Z5111 Encounter for antineoplastic chemotherapy: Secondary | ICD-10-CM | POA: Diagnosis not present

## 2022-03-13 DIAGNOSIS — Z79899 Other long term (current) drug therapy: Secondary | ICD-10-CM | POA: Diagnosis not present

## 2022-03-13 DIAGNOSIS — Z5112 Encounter for antineoplastic immunotherapy: Secondary | ICD-10-CM | POA: Diagnosis not present

## 2022-03-13 MED ORDER — HEPARIN SOD (PORK) LOCK FLUSH 100 UNIT/ML IV SOLN
500.0000 [IU] | Freq: Once | INTRAVENOUS | Status: AC | PRN
  Administered 2022-03-13: 500 [IU]
  Filled 2022-03-13: qty 5

## 2022-03-13 MED ORDER — SODIUM CHLORIDE 0.9% FLUSH
10.0000 mL | INTRAVENOUS | Status: DC | PRN
  Administered 2022-03-13: 10 mL
  Filled 2022-03-13: qty 10

## 2022-03-13 MED ORDER — PEGFILGRASTIM-CBQV 6 MG/0.6ML ~~LOC~~ SOSY
6.0000 mg | PREFILLED_SYRINGE | Freq: Once | SUBCUTANEOUS | Status: AC
  Administered 2022-03-13: 6 mg via SUBCUTANEOUS

## 2022-03-18 ENCOUNTER — Inpatient Hospital Stay: Payer: Medicare HMO | Admitting: Licensed Clinical Social Worker

## 2022-03-18 DIAGNOSIS — C189 Malignant neoplasm of colon, unspecified: Secondary | ICD-10-CM

## 2022-03-18 NOTE — Progress Notes (Signed)
[place] CSW Progress Note  Holiday representative met with caregiver to discuss adjustment concerns during treatment. Patient reports she is doing better but is still having doubts as to whether they doctors have the correct diagnosis.  Patient reported she has been dreaming a lot about members of her family that have passed.  Patient stated she didn't mind the dreams.  Patient spoke at length about her children, grandchildren and great-grandchildren and an upcoming family reunion.  Patient and CSW discussed ways to maintain boundaries with family members and friends.  Patient spoke about her funeral arrangements and preferring to have her last pastor head the service.  CSW and patient discussed ways she could contact the pastor and make this request.  Patient and CSW discussed keeping physically active and CSW mentioned the CARE program.  The patient stated she was interested in attending the program.  CSW will send program information to patient's daughter and primary caregiver, Zeb Comfort.  Patient and CSW spoke about her faith in God and her spiritual resilience.  Patient scheduled appointment with CSW for July 10th at 11:00AM.    Adelene Amas, LCSW

## 2022-03-20 ENCOUNTER — Inpatient Hospital Stay (HOSPITAL_BASED_OUTPATIENT_CLINIC_OR_DEPARTMENT_OTHER): Payer: Medicare HMO | Admitting: Hospice and Palliative Medicine

## 2022-03-20 DIAGNOSIS — C189 Malignant neoplasm of colon, unspecified: Secondary | ICD-10-CM | POA: Diagnosis not present

## 2022-03-20 DIAGNOSIS — C787 Secondary malignant neoplasm of liver and intrahepatic bile duct: Secondary | ICD-10-CM | POA: Diagnosis not present

## 2022-03-20 DIAGNOSIS — Z515 Encounter for palliative care: Secondary | ICD-10-CM | POA: Diagnosis not present

## 2022-03-20 NOTE — Progress Notes (Signed)
Virtual Visit via Telephone Note  I connected with Chelsea [name] on 03/20/22 at  1:20 PM EDT by telephone and verified that I am speaking with the correct person using two identifiers.  Location: Patient: Home Provider: Clinic   I discussed the limitations, risks, security and privacy concerns of performing an evaluation and management service by telephone and the availability of in person appointments. I also discussed with the patient that there may be a patient responsible charge related to this service. The patient expressed understanding and agreed to proceed.   History of Present Illness: Chelsea [name] is a 76 year old woman with multiple medical problems including metastatic colon cancer who was started on chemotherapy with cycle one 5-FU and bevacizumab on 10/20/2021.   Observations/Objective: I called and spoke with patient.  She voiced some frustrations with her medical team saying that "everyone just expects me to die."  However, she feels like she is doing reasonably well.  Sounds like performance status is stable.  Appetite is good.  Her only symptomatic complaint today is radiculopathy, which patient attributes to a "pinched nerve."  She has tried muscle relaxers without benefit.  We discussed a pain regimen but patient says that she does not want to "take dope."  I suggested that we could trial steroid for inflammatory pain the patient says that she wants to speak to Dr. Tasia Catchings about that.  Assessment and Plan: Colon cancer -on systemic chemotherapy.  Appears to be doing well without any significant symptomatic complaints.  She sees Dr. Tasia Catchings next week  Follow Up Instructions: As needed   I discussed the assessment and treatment plan with the patient. The patient was provided an opportunity to ask questions and all were answered. The patient agreed with the plan and demonstrated an understanding of the instructions.   The patient was advised to call back or seek an in-person  evaluation if the symptoms worsen or if the condition fails to improve as anticipated.  I provided 5 minutes of non-face-to-face time during this encounter.   Irean Hong, NP

## 2022-03-25 ENCOUNTER — Inpatient Hospital Stay: Payer: Medicare HMO

## 2022-03-25 ENCOUNTER — Inpatient Hospital Stay (HOSPITAL_BASED_OUTPATIENT_CLINIC_OR_DEPARTMENT_OTHER): Payer: Medicare HMO | Admitting: Oncology

## 2022-03-25 ENCOUNTER — Encounter: Payer: Self-pay | Admitting: Oncology

## 2022-03-25 VITALS — BP 109/73 | HR 88 | Temp 96.7°F | Wt 165.0 lb

## 2022-03-25 DIAGNOSIS — T451X5A Adverse effect of antineoplastic and immunosuppressive drugs, initial encounter: Secondary | ICD-10-CM | POA: Diagnosis not present

## 2022-03-25 DIAGNOSIS — C787 Secondary malignant neoplasm of liver and intrahepatic bile duct: Secondary | ICD-10-CM | POA: Diagnosis not present

## 2022-03-25 DIAGNOSIS — G629 Polyneuropathy, unspecified: Secondary | ICD-10-CM | POA: Insufficient documentation

## 2022-03-25 DIAGNOSIS — C189 Malignant neoplasm of colon, unspecified: Secondary | ICD-10-CM

## 2022-03-25 DIAGNOSIS — Z5111 Encounter for antineoplastic chemotherapy: Secondary | ICD-10-CM

## 2022-03-25 DIAGNOSIS — D701 Agranulocytosis secondary to cancer chemotherapy: Secondary | ICD-10-CM | POA: Insufficient documentation

## 2022-03-25 DIAGNOSIS — F329 Major depressive disorder, single episode, unspecified: Secondary | ICD-10-CM | POA: Diagnosis not present

## 2022-03-25 DIAGNOSIS — F411 Generalized anxiety disorder: Secondary | ICD-10-CM | POA: Diagnosis not present

## 2022-03-25 DIAGNOSIS — Z5112 Encounter for antineoplastic immunotherapy: Secondary | ICD-10-CM | POA: Diagnosis not present

## 2022-03-25 DIAGNOSIS — R634 Abnormal weight loss: Secondary | ICD-10-CM

## 2022-03-25 DIAGNOSIS — Z79899 Other long term (current) drug therapy: Secondary | ICD-10-CM | POA: Diagnosis not present

## 2022-03-25 DIAGNOSIS — I10 Essential (primary) hypertension: Secondary | ICD-10-CM | POA: Diagnosis not present

## 2022-03-25 DIAGNOSIS — E785 Hyperlipidemia, unspecified: Secondary | ICD-10-CM | POA: Diagnosis not present

## 2022-03-25 DIAGNOSIS — I700 Atherosclerosis of aorta: Secondary | ICD-10-CM | POA: Diagnosis not present

## 2022-03-25 LAB — CBC WITH DIFFERENTIAL/PLATELET
Abs Immature Granulocytes: 0.22 10*3/uL — ABNORMAL HIGH (ref 0.00–0.07)
Basophils Absolute: 0.1 10*3/uL (ref 0.0–0.1)
Basophils Relative: 1 %
Eosinophils Absolute: 0.2 10*3/uL (ref 0.0–0.5)
Eosinophils Relative: 1 %
HCT: 35.7 % — ABNORMAL LOW (ref 36.0–46.0)
Hemoglobin: 11.1 g/dL — ABNORMAL LOW (ref 12.0–15.0)
Immature Granulocytes: 2 %
Lymphocytes Relative: 20 %
Lymphs Abs: 2.4 10*3/uL (ref 0.7–4.0)
MCH: 32.7 pg (ref 26.0–34.0)
MCHC: 31.1 g/dL (ref 30.0–36.0)
MCV: 105.3 fL — ABNORMAL HIGH (ref 80.0–100.0)
Monocytes Absolute: 1.2 10*3/uL — ABNORMAL HIGH (ref 0.1–1.0)
Monocytes Relative: 10 %
Neutro Abs: 7.7 10*3/uL (ref 1.7–7.7)
Neutrophils Relative %: 66 %
Platelets: 156 10*3/uL (ref 150–400)
RBC: 3.39 MIL/uL — ABNORMAL LOW (ref 3.87–5.11)
RDW: 16.8 % — ABNORMAL HIGH (ref 11.5–15.5)
WBC: 11.7 10*3/uL — ABNORMAL HIGH (ref 4.0–10.5)
nRBC: 0.3 % — ABNORMAL HIGH (ref 0.0–0.2)

## 2022-03-25 LAB — COMPREHENSIVE METABOLIC PANEL
ALT: 14 U/L (ref 0–44)
AST: 24 U/L (ref 15–41)
Albumin: 3.5 g/dL (ref 3.5–5.0)
Alkaline Phosphatase: 128 U/L — ABNORMAL HIGH (ref 38–126)
Anion gap: 9 (ref 5–15)
BUN: 14 mg/dL (ref 8–23)
CO2: 28 mmol/L (ref 22–32)
Calcium: 9.2 mg/dL (ref 8.9–10.3)
Chloride: 102 mmol/L (ref 98–111)
Creatinine, Ser: 1.18 mg/dL — ABNORMAL HIGH (ref 0.44–1.00)
GFR, Estimated: 48 mL/min — ABNORMAL LOW (ref >60)
Glucose, Bld: 126 mg/dL — ABNORMAL HIGH (ref 70–99)
Potassium: 3.4 mmol/L — ABNORMAL LOW (ref 3.5–5.1)
Sodium: 139 mmol/L (ref 135–145)
Total Bilirubin: 0.5 mg/dL (ref 0.3–1.2)
Total Protein: 7.4 g/dL (ref 6.5–8.1)

## 2022-03-25 LAB — PROTEIN, URINE, RANDOM: Total Protein, Urine: 13 mg/dL

## 2022-03-25 MED ORDER — SODIUM CHLORIDE 0.9% FLUSH
10.0000 mL | Freq: Once | INTRAVENOUS | Status: AC
  Administered 2022-03-25: 10 mL via INTRAVENOUS
  Filled 2022-03-25: qty 10

## 2022-03-25 MED ORDER — SODIUM CHLORIDE 0.9 % IV SOLN
Freq: Once | INTRAVENOUS | Status: DC
  Filled 2022-03-25: qty 250

## 2022-03-25 MED ORDER — LEUCOVORIN CALCIUM INJECTION 350 MG
700.0000 mg | Freq: Once | INTRAVENOUS | Status: AC
  Administered 2022-03-25: 700 mg via INTRAVENOUS
  Filled 2022-03-25: qty 35

## 2022-03-25 MED ORDER — PALONOSETRON HCL INJECTION 0.25 MG/5ML
0.2500 mg | Freq: Once | INTRAVENOUS | Status: AC
  Administered 2022-03-25: 0.25 mg via INTRAVENOUS
  Filled 2022-03-25: qty 5

## 2022-03-25 MED ORDER — SODIUM CHLORIDE 0.9 % IV SOLN
5.0000 mg/kg | Freq: Once | INTRAVENOUS | Status: AC
  Administered 2022-03-25: 400 mg via INTRAVENOUS
  Filled 2022-03-25: qty 16

## 2022-03-25 MED ORDER — ACETAMINOPHEN 325 MG PO TABS
650.0000 mg | ORAL_TABLET | Freq: Once | ORAL | Status: AC
  Administered 2022-03-25: 650 mg via ORAL
  Filled 2022-03-25: qty 2

## 2022-03-25 MED ORDER — DEXTROSE 5 % IV SOLN
INTRAVENOUS | Status: DC
  Filled 2022-03-25: qty 250

## 2022-03-25 MED ORDER — PROCHLORPERAZINE MALEATE 10 MG PO TABS
10.0000 mg | ORAL_TABLET | Freq: Once | ORAL | Status: AC
  Administered 2022-03-25: 10 mg via ORAL
  Filled 2022-03-25: qty 1

## 2022-03-25 MED ORDER — SODIUM CHLORIDE 0.9 % IV SOLN
Freq: Once | INTRAVENOUS | Status: AC
  Filled 2022-03-25: qty 250

## 2022-03-25 MED ORDER — OXALIPLATIN CHEMO INJECTION 100 MG/20ML
65.0000 mg/m2 | Freq: Once | INTRAVENOUS | Status: AC
  Administered 2022-03-25: 120 mg via INTRAVENOUS
  Filled 2022-03-25: qty 20

## 2022-03-25 MED ORDER — SODIUM CHLORIDE 0.9 % IV SOLN
2400.0000 mg/m2 | INTRAVENOUS | Status: DC
  Administered 2022-03-25: 4300 mg via INTRAVENOUS
  Filled 2022-03-25: qty 86

## 2022-03-25 NOTE — Patient Instructions (Signed)
[facility]  Discharge Instructions: Thank you for choosing [facility] to provide your oncology and hematology care.  If you have a lab appointment with the Cancer Center, please go directly to the Cancer Center and check in at the registration area.  Wear comfortable clothing and clothing appropriate for easy access to any Portacath or PICC line.   We strive to give you quality time with your provider. You may need to reschedule your appointment if you arrive late (15 or more minutes).  Arriving late affects you and other patients whose appointments are after yours.  Also, if you miss three or more appointments without notifying the office, you may be dismissed from the clinic at the provider's discretion.      For prescription refill requests, have your pharmacy contact our office and allow 72 hours for refills to be completed.    Today you received the following chemotherapy and/or immunotherapy agents Zirabev, Oxaliplatin, Leucovorin and Adrucil       To help prevent nausea and vomiting after your treatment, we encourage you to take your nausea medication as directed.  BELOW ARE SYMPTOMS THAT SHOULD BE REPORTED IMMEDIATELY: *FEVER GREATER THAN 100.4 F (38 C) OR HIGHER *CHILLS OR SWEATING *NAUSEA AND VOMITING THAT IS NOT CONTROLLED WITH YOUR NAUSEA MEDICATION *UNUSUAL SHORTNESS OF BREATH *UNUSUAL BRUISING OR BLEEDING *URINARY PROBLEMS (pain or burning when urinating, or frequent urination) *BOWEL PROBLEMS (unusual diarrhea, constipation, pain near the anus) TENDERNESS IN MOUTH AND THROAT WITH OR WITHOUT PRESENCE OF ULCERS (sore throat, sores in mouth, or a toothache) UNUSUAL RASH, SWELLING OR PAIN  UNUSUAL VAGINAL DISCHARGE OR ITCHING   Items with * indicate a potential emergency and should be followed up as soon as possible or go to the Emergency Department if any problems should occur.  Please show the CHEMOTHERAPY ALERT CARD or  IMMUNOTHERAPY ALERT CARD at check-in to the Emergency Department and triage nurse.  Should you have questions after your visit or need to cancel or reschedule your appointment, please contact [facility]  [phone] and follow the prompts.  Office hours are 8:00 a.m. to 4:30 p.m. Monday - Friday. Please note that voicemails left after 4:00 p.m. may not be returned until the following business day.  We are closed weekends and major holidays. You have access to a nurse at all times for urgent questions. Please call the main number to the clinic [phone] and follow the prompts.  For any non-urgent questions, you may also contact your provider using MyChart. We now offer e-Visits for anyone 18 and older to request care online for non-urgent symptoms. For details visit [email or web address].   Also download the MyChart app! Go to the app store, search "MyChart", open the app, select , and log in with your MyChart username and password.  Masks are optional in the cancer centers. If you would like for your care team to wear a mask while they are taking care of you, please let them know. For doctor visits, patients may have with them one support person who is at least 77 years old. At this time, visitors are not allowed in the infusion area.  

## 2022-03-25 NOTE — Assessment & Plan Note (Signed)
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 Continue gabapentin 300 mg twice daily. 

## 2022-03-25 NOTE — Assessment & Plan Note (Addendum)
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  No sufficient tissue for NGS Liquid biopsy showed KRAS12V, APC, TP53 mutation, TMB 4.  S/p 6 cycles of 5-FU /Bevacizumab--> CT showed mixed response--> FOLFOX/bevacizumab.  Labs reviewed and discussed with patient Proceed with FOLFOX bevacizumab today.  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  Shared decision was made to defer biopsy , proceed with treatment for metastatic colon cancer and repeat short-term CT for follow-up.

## 2022-03-25 NOTE — Progress Notes (Signed)
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  No sufficient tissue for NGS Liquid biopsy showed KRAS12V, APC, TP53 mutation, TMB 4.  S/p 6 cycles of 5-FU /Bevacizumab--> CT showed mixed response--> FOLFOX/bevacizumab.  Labs reviewed and discussed with patient Proceed with FOLFOX bevacizumab today.  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  Shared decision was made to defer biopsy , proceed with treatment for metastatic colon cancer and repeat short-term CT for follow-up.  Weight loss Follow-up with nutritionist.  Continue nutrition supplementation.  Encourage oral intake and hydration. IV normal saline 500 cc x 1 today.  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 Continue gabapentin 300 mg twice daily.  Chemotherapy induced neutropenia (HCC) patient gets prophylactic G-CSF with Udenyca on day 3.  Claritin 10 mg daily for 4 days.    No orders of the defined types were placed in this encounter.  Follow-up in 2 weeks lab [name]. All questions were answered. The patient knows to call the clinic with any problems, questions or concerns.  [name], MD, PhD [facility] Hematology Oncology 03/25/2022   CHIEF COMPLAINTS/REASON FOR VISIT:  metastatic colon cancer  HISTORY OF PRESENTING ILLNESS:   Chelsea [name] is a  76 y.o.  female with PMH listed below was seen in consultation at the request of  Jearld Fenton, NP  for evaluation of metastatic colon cancer.  Patient initially went to emergency room on  03/24/2021 for abdominal pain. 03/24/2021, CT scan of the abdomen showed marked bladder wall thickening with surrounding soft tissue stranding is noted.  Concerning for cystitis.  There is a lobulated mass between the posterior wall of the bladder and the rectum which appears to arise from the vaginal cuff.  This is indeterminate and difficult to characterize reflecting lack of IV contrast material.  Nonobstructing left renal calculus, left sacroiliitis, lumbar spondylosis aortic atherosclerosis. 03/24/2021 CT pelvic showed abdominal [place] soft tissue of right cecum, concerning for colon carcinoma.  Colonoscopy recommended.  Low-attenuation mass in the region of vaginal cuff is again noted.  Diffuse urinary bladder wall thickening and mild bilateral hydroureter possibly cystitis.  [place] 03/24/2021 Lobulated solid 3.8 cm mass confirmed by ultrasound. This is most compatible with a neoplasm but remains indeterminate as to the origin as the epicenter seems to be outside of both the vaginal cuff and the adjacent colon, while the lesion appears inseparable from both. Extensive echogenic debris within the distended urinary bladder.   03/26/2021, patient was seen by Dr. Theora Gianotti for evaluation of colonic/vaginal cuff pelvic mass.  Patient also was referred to gastroenterology for colonoscopy.  03/26/2021 CEA 3.4.  CA125 9.7 04/04/2021, status post colonoscopy which showed a frond like /villous nonobstructing large mass was found in the cecum, this was biopsied.  Terminal ileum was briefly intubated and appeared normal.  Diverticulosis in the sigmoid colon.  The rectum, sigmoid colon, descending colon, transverse colon and ascending colon were normal.  Nonbleeding internal hemorrhoids. 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    Given that the biopsy may not be representative of the entire underlying lesion.  Patient was  recommended to establish with surgeon for evaluation. Patient no  showed for her surgery appointment and as well as her follow-up appointment with gastroenterology.  Per daughter, patient was having cervical spine surgery and she prioritized that over the colon surgery.  08/19/2021, patient establish care with surgery Dr. Christian Mate 08/21/2021, CT abdomen pelvis with contrast  showed soft tissue mass at the base of cecum measuring up to 5 cm, slightly increased in size.  Interval development of multiple low-density lesions throughout the liver highly suggestive for metastatic disease.  Complex mass in the region of the vaginal cuff measuring up to 5.2 cm, increased in size.  Right ovary also appears more prominent on the current examination.  Moderate large volume of stool throughout the colon.  Mild urinary bladder wall thickening.  Colonic diverticulosis without evidence of active diverticulitis.  Aortic atherosclerosis  09/04/2021, patient is status post left lobe liver lesion biopsy and pathology showed metastatic adenocarcinoma, compatible with colorectal primary.  She was referred to establish care with oncology. She was accompanied by her daughter today.  Patient walks with a walker.+ Night sweats + weight loss.  Denies any abdominal pain currently, melena, blood in the stool.  She is currently being followed by home health PT/OT following a previous spinal surgery  10/10/2020, status post Mediport placement. 10/20/2020, cycle one 5-FU/bevacizumab.  Foundation one liquid biopsy testing showed KRASG12V, APC TP53 TMB 4  12/22/2021 CT chest abdomen pelvis Decreased cecum mass, however increased size and size of metastatic lesions throughout the liver.  Interval enlargement of vaginal cuff mass. No mets in chest.   # During the interval, patient was seen by gynecology oncology Dr. Fransisca Connors.  Biopsy of the vaginal mass was not recommended. Patient's case was also discussed on multidisciplinary tumor board.  IR potentially can try a biopsy if patient is willing  to. I had a discussion with patient's daughter over the phone prior to this visit.  We discussed about option of adding oxaliplatin to 5-FU/bevacizumab and continue treatment of colon cancer, repeat CT scan short-term and if progression, repeat biopsy versus proceeding with vaginal mass biopsy.  Daughter prefers proceeding with chemotherapy and defer biopsy.   01/07/22, FOLFOX/bev  01/08/2022 patient has had a second opinion at [facility] and was seen by Dr. Reynaldo Minium .  Dr. Reynaldo Minium agrees with the current treatment plan with FOLFOX/bevacizumab.  He has ordered guardant 360 to see if she may be eligible for clinical trial in the future.     INTERVAL HISTORY Chelsea [name] is a 76 y.o. female who has above history reviewed by me today presents for follow up visit for management of metastatic colon cancer Patient was accompanied by her daughter Patient feels tired, she take naps during the day.  Patient has pre-existing neuropathy, right hand,  She currently takes gabapentin 200 mg twice daily. Denies any shortness of breath, chest pain, nausea vomiting diarrhea today.   Decreased oral   Review of Systems  Constitutional:  Positive for fatigue. Negative for appetite change, chills, fever and unexpected weight change.  HENT:   Negative for hearing loss and voice change.   Eyes:  Negative for eye problems.  Respiratory:  Negative for chest tightness and cough.   Cardiovascular:  Negative for chest pain.  Gastrointestinal:  Negative for abdominal distention, abdominal pain, blood in stool and diarrhea.  Endocrine: Negative for hot flashes.  Genitourinary:  Negative for difficulty urinating and frequency.   Musculoskeletal:  Positive for back pain. Negative for arthralgias.  Skin:  Negative for itching and rash.  Neurological:  Positive for numbness (chornic finger tips). Negative for extremity weakness.  Hematological:  Negative for adenopathy.  Psychiatric/Behavioral:  Negative for confusion.      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)    Moderate mitral insufficiency    Port-A-Cath in place    Seizures [facility])    Spinal stenosis    Ulcer    Urinary incontinence     SURGICAL HISTORY: Past Surgical History:  Procedure Laterality Date   ABDOMINAL HYSTERECTOMY     BACK SURGERY     due to polio   BREAST BIOPSY Bilateral    neg   BREAST BIOPSY Right 2011   neg/stereo   CARPAL TUNNEL RELEASE     CATARACT EXTRACTION Right 2020   COLONOSCOPY WITH PROPOFOL N/A 04/04/2021   Procedure: COLONOSCOPY WITH PROPOFOL;  Surgeon: Virgel Manifold, MD;  Location: [facility] ENDOSCOPY;  Service: Endoscopy;  Laterality: N/A;   EYE SURGERY     IR IMAGING GUIDED PORT INSERTION  10/09/2021    SOCIAL HISTORY: Social History   Socioeconomic History   Marital status: Divorced    Spouse name: Not on file   Number of children: Not on file   Years of education: Not on file   Highest education level: Not on file  Occupational History   Not on file  Tobacco Use   Smoking status: Never   Smokeless tobacco: Never  Vaping Use   Vaping Use: Never used  Substance and Sexual Activity   Alcohol use: Never   Drug use: No   Sexual activity: Not Currently  Other Topics Concern   Not on file  Social History Narrative   Not on file   Social Determinants of Health   Financial Resource Strain: Low Risk  (01/12/2022)   Overall Financial Resource Strain (CARDIA)    Difficulty of Paying Living Expenses: Not very hard  Food Insecurity: No Food Insecurity (01/12/2022)   Hunger Vital Sign    Worried About Running Out of Food in the Last Year: Never true    Ran Out of Food in the Last Year: Never true  Transportation Needs: No Transportation Needs (01/12/2022)   PRAPARE - Hydrologist (Medical): No    Lack of Transportation  (Non-Medical): No  Physical Activity: Inactive (01/12/2022)   Exercise Vital Sign    Days of Exercise per Week: 0 days    Minutes of Exercise per Session: 0 min  Stress: Stress Concern Present (01/12/2022)   [place]    Feeling of Stress : To some extent  Social Connections: Moderately Integrated (01/12/2022)   Social Connection and Isolation Panel [NHANES]    Frequency of Communication with Friends and Family: Three times a week    Frequency of Social Gatherings with Friends and Family: Three times a week    Attends Religious Services: 1 to 4 times per year    Active Member of Clubs or Organizations: No    Attends Archivist Meetings: 1 to 4 times per year    Marital Status: Widowed  Intimate Partner Violence: Not At Risk (01/12/2022)   Humiliation, Afraid, Rape, and Kick questionnaire    Fear of Current or Ex-Partner: No  Emotionally Abused: No    Physically Abused: No    Sexually Abused: No    FAMILY HISTORY: Family History  Problem Relation Age of Onset   Arthritis Mother    Heart disease Mother    Stroke Mother    Hypertension Mother    COPD Mother    Sudden death Sister    Other Father        unknown medical history   Breast cancer Neg Hx     ALLERGIES:  is allergic to penicillins.  MEDICATIONS:  Current Outpatient Medications  Medication Sig Dispense Refill   acetaminophen (TYLENOL) 500 MG tablet Take 500 mg by mouth every 6 (six) hours as needed.     ALPRAZolam (XANAX) 0.5 MG tablet TAKE 1 TABLET BY MOUTH TWICE A DAY AS NEEDED FOR ANXIETY 60 tablet 0   aspirin 325 MG tablet Take 325 mg by mouth daily.     atorvastatin (LIPITOR) 10 MG tablet TAKE 1 TABLET BY MOUTH EVERY DAY 90 tablet 0   busPIRone (BUSPAR) 5 MG tablet Take 1 tablet (5 mg total) by mouth 3 (three) times daily. 270 tablet 1   Cyanocobalamin (VITAMIN B 12 PO) Take 500 mcg by mouth daily.     diclofenac Sodium (VOLTAREN) 1  % GEL Apply 4 g topically 4 (four) times daily. 4 g 0   docusate sodium (COLACE) 100 MG capsule Take 1 capsule (100 mg total) by mouth 2 (two) times daily. 60 capsule 0   FLUoxetine (PROZAC) 20 MG capsule TAKE 3 CAPSULES (60 MG TOTAL) BY MOUTH EVERY MORNING. 270 capsule 0   gabapentin (NEURONTIN) 300 MG capsule Take 1 capsule (300 mg total) by mouth 2 (two) times daily. 180 capsule 1   KLOR-CON M20 20 MEQ tablet TAKE 1 TABLET BY MOUTH EVERY DAY 30 tablet 1   lidocaine (LIDODERM) 5 % Place 1 patch onto the skin daily. Remove & Discard patch within 12 hours or as directed by MD 1 patch 0   lidocaine-prilocaine (EMLA) cream Apply small amount of cream to port site 1-2 hour prior to chemo treatment. 30 g 3   loperamide (IMODIUM) 2 MG capsule TAKE 1 CAP BY MOUTH SEE ADMIN INSTRUCTIONS. INITIAL: 4 MG, FOLLOWED BY 2 MG AFTER EACH LOOSE STOOL MAXIMUM: 16 MG/DAY 60 capsule 0   loratadine (CLARITIN) 10 MG tablet Take 1 tablet (10 mg total) by mouth See admin instructions. Take 1 tablet daily for 4 days after each chemotherapy treatments. 30 tablet 2   meclizine (ANTIVERT) 12.5 MG tablet Take 1 tablet (12.5 mg total) by mouth 3 (three) times daily as needed for dizziness. 30 tablet 0   methocarbamol (ROBAXIN) 500 MG tablet TAKE 1 TABLET TWICE DAILY AS NEEDED FOR MUSCLE SPASM(S) 60 tablet 0   ondansetron (ZOFRAN) 8 MG tablet Take 1 tablet (8 mg total) by mouth 2 (two) times daily as needed (Nausea or vomiting). 30 tablet 1   polyethylene glycol powder (GLYCOLAX/MIRALAX) 17 GM/SCOOP powder Take 17 g by mouth daily. 3350 g 1   prochlorperazine (COMPAZINE) 10 MG tablet Take 1 tablet (10 mg total) by mouth every 6 (six) hours as needed (Nausea or vomiting). 30 tablet 1   triamterene-hydrochlorothiazide (MAXZIDE-25) 37.5-25 MG tablet TAKE 1 TABLET BY MOUTH EVERY DAY 90 tablet 0   vitamin E 180 MG (400 UNITS) capsule Take 400 Units by mouth daily.     No current facility-administered medications for this visit.    Facility-Administered Medications Ordered in Other Visits  Medication Dose  Route Frequency Provider Last Rate Last Admin   0.9 %  sodium chloride infusion   Intravenous Once [name], MD       dextrose 5 % solution   Intravenous Continuous [name], MD   Stopped at 03/25/22 1146   fluorouracil (ADRUCIL) 4,300 mg in sodium chloride 0.9 % 64 mL chemo infusion  2,400 mg/m2 (Order-Specific) Intravenous 1 day or 1 dose [name], MD       leucovorin 700 mg in dextrose 5 % 250 mL infusion  700 mg Intravenous Once [name], MD 153 mL/hr at 03/25/22 1208 Infusion Verify at 03/25/22 1208   oxaliplatin (ELOXATIN) 120 mg in dextrose 5 % 500 mL chemo infusion  65 mg/m2 (Treatment Plan Recorded) Intravenous Once [name], MD 285 mL/hr at 03/25/22 1208 Infusion Verify at 03/25/22 1208     PHYSICAL EXAMINATION: ECOG PERFORMANCE STATUS: 2 - Symptomatic, <50% confined to bed Vitals:   03/25/22 0843  BP: 109/73  Pulse: 88  Temp: (!) 96.7 F (35.9 C)    Filed Weights   03/25/22 0843  Weight: 165 lb (74.8 kg)      Physical Exam Constitutional:      General: She is not in acute distress.    Comments: Patient sits in wheelchair  HENT:     Head: Normocephalic and atraumatic.  Eyes:     General: No scleral icterus. Cardiovascular:     Rate and Rhythm: Normal rate and regular rhythm.     Heart sounds: Normal heart sounds.  Pulmonary:     Effort: Pulmonary effort is normal. No respiratory distress.     Breath sounds: No wheezing.  Abdominal:     General: Bowel sounds are normal. There is no distension.     Palpations: Abdomen is soft.  Musculoskeletal:        General: No deformity. Normal range of motion.     Cervical back: Normal range of motion and neck supple.  Skin:    General: Skin is warm and dry.     Findings: No erythema or rash.  Neurological:     Mental Status: She is alert and oriented to person, place, and time. Mental status is at baseline.     Cranial Nerves: No cranial  nerve deficit.     Coordination: Coordination normal.  Psychiatric:        Mood and Affect: Mood normal.     LABORATORY DATA:  I have reviewed the data as listed Lab Results  Component Value Date   WBC 11.7 (H) 03/25/2022   HGB 11.1 (L) 03/25/2022   HCT 35.7 (L) 03/25/2022   MCV 105.3 (H) 03/25/2022   PLT 156 03/25/2022   Recent Labs    02/25/22 0802 03/11/22 0832 03/25/22 0809  NA 137 138 139  K 3.7 3.4* 3.4*  CL 101 101 102  CO2 _0 GLUCOSE 95 114* 126*  BUN _1 CREATININE 0.90 0.76 1.18*  CALCIUM 9.0 8.8* 9.2  GFRNONAA >60 >60 48*  PROT 7.2 7.2 7.4  ALBUMIN 3.6 3.4* 3.5  AST _2 ALT _3 ALKPHOS 88 119 128*  BILITOT 0.5 0.3 0.5    Iron/TIBC/Ferritin/ %Sat    Component Value Date/Time   FERRITIN 58 10/24/2020 0402       RADIOGRAPHIC STUDIES: I have personally reviewed the radiological images as listed and agreed with the findings in the report. DG Lumbar Spine Complete  Result Date: 02/20/2022 CLINICAL DATA:  Low back pain EXAM: LUMBAR SPINE - COMPLETE 4+ VIEW COMPARISON:  None Available. FINDINGS: Lumbar vertebral body height are preserved without evidence of fracture. Grade 1 anterolisthesis of L4 on L5. Moderate intervertebral disc space narrowing at L5-S1. Facet arthropathy throughout the lumbar spine. No spondylolysis identified. IMPRESSION: Degenerative changes of the lumbar spine with no acute fracture identified. Electronically Signed   By: Ofilia Neas M.D.   On: 02/20/2022 15:46   DG Shoulder Right  Result Date: 02/20/2022 CLINICAL DATA:  Right shoulder pain EXAM: RIGHT SHOULDER - 2+ VIEW COMPARISON:  Right shoulder x-ray 07/08/2019 FINDINGS: No acute fracture or dislocation identified. Moderate to severe narrowing of the glenohumeral joint with large inferior marginal osteophytes. Mild narrowing of the acromioclavicular joint. Small likely subcortical cysts at the greater tuberosity of the humerus. IMPRESSION:  Degenerative changes of the shoulder as described. Electronically Signed   By: Ofilia Neas M.D.   On: 02/20/2022 15:45

## 2022-03-25 NOTE — Assessment & Plan Note (Addendum)
Follow-up with nutritionist.  Continue nutrition supplementation.  Encourage oral intake and hydration. IV normal saline 500 cc x 1 today.

## 2022-03-25 NOTE — Assessment & Plan Note (Signed)
patient gets prophylactic G-CSF with Udenyca on day 3.  Claritin 10 mg daily for 4 days.   

## 2022-03-26 ENCOUNTER — Telehealth: Payer: Medicare HMO | Admitting: Hospice and Palliative Medicine

## 2022-03-27 ENCOUNTER — Inpatient Hospital Stay: Payer: Medicare HMO

## 2022-03-27 VITALS — BP 119/67 | HR 80 | Temp 97.3°F | Resp 18

## 2022-03-27 DIAGNOSIS — Z79899 Other long term (current) drug therapy: Secondary | ICD-10-CM | POA: Diagnosis not present

## 2022-03-27 DIAGNOSIS — C787 Secondary malignant neoplasm of liver and intrahepatic bile duct: Secondary | ICD-10-CM

## 2022-03-27 DIAGNOSIS — T451X5A Adverse effect of antineoplastic and immunosuppressive drugs, initial encounter: Secondary | ICD-10-CM | POA: Diagnosis not present

## 2022-03-27 DIAGNOSIS — D701 Agranulocytosis secondary to cancer chemotherapy: Secondary | ICD-10-CM | POA: Diagnosis not present

## 2022-03-27 DIAGNOSIS — Z5111 Encounter for antineoplastic chemotherapy: Secondary | ICD-10-CM | POA: Diagnosis not present

## 2022-03-27 DIAGNOSIS — Z5112 Encounter for antineoplastic immunotherapy: Secondary | ICD-10-CM | POA: Diagnosis not present

## 2022-03-27 DIAGNOSIS — C189 Malignant neoplasm of colon, unspecified: Secondary | ICD-10-CM | POA: Diagnosis not present

## 2022-03-27 MED ORDER — HEPARIN SOD (PORK) LOCK FLUSH 100 UNIT/ML IV SOLN
500.0000 [IU] | Freq: Once | INTRAVENOUS | Status: AC | PRN
  Administered 2022-03-27: 500 [IU]
  Filled 2022-03-27: qty 5

## 2022-03-27 MED ORDER — SODIUM CHLORIDE 0.9% FLUSH
10.0000 mL | INTRAVENOUS | Status: DC | PRN
  Administered 2022-03-27: 10 mL
  Filled 2022-03-27: qty 10

## 2022-03-27 MED ORDER — PEGFILGRASTIM-CBQV 6 MG/0.6ML ~~LOC~~ SOSY
6.0000 mg | PREFILLED_SYRINGE | Freq: Once | SUBCUTANEOUS | Status: AC
  Administered 2022-03-27: 6 mg via SUBCUTANEOUS
  Filled 2022-03-27: qty 0.6

## 2022-04-01 ENCOUNTER — Other Ambulatory Visit: Payer: Self-pay | Admitting: Internal Medicine

## 2022-04-01 NOTE — Telephone Encounter (Signed)
Requested medication (s) are due for refill today: yes  Requested medication (s) are on the active medication list: yes  Last refill:  02/16/22  Future visit scheduled: no  Notes to clinic:  Unable to refill per protocol, cannot delegate.      Requested Prescriptions  Pending Prescriptions Disp Refills   methocarbamol (ROBAXIN) 500 MG tablet [Pharmacy Med Name: METHOCARBAMOL 500 MG Tablet] 60 tablet 0    Sig: TAKE 1 TABLET TWICE DAILY AS NEEDED FOR MUSCLE SPASM(S)     Not Delegated - Analgesics:  Muscle Relaxants Failed - 04/01/2022  3:44 AM      Failed - This refill cannot be delegated      Passed - Valid encounter within last 6 months    Recent Outpatient Visits           1 month ago Right leg weakness   [facility] [place], Coralie Keens, NP   5 months ago Pure hypercholesterolemia   [place], Coralie Keens, NP   6 months ago 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 [facility] [place])   [facility], Coralie Keens, NP   8 months ago [facility] [place], Coralie Keens, NP   10 months ago Cervical myelopathy [facility])   [facility], Coralie Keens, NP       Future Appointments             In 3 weeks Garnette Gunner, Coralie Keens, NP [facility], [facility]

## 2022-04-09 ENCOUNTER — Telehealth: Payer: Self-pay

## 2022-04-09 NOTE — Telephone Encounter (Signed)
11:35 AM: Palliative care SW outreached patient/family for monthly telephonic visit and to schedule in person visit.  Call unsuccessful. SW left HIPPA compliant VM.  Awaiting return call.   11:37 AM: PC SW also outreached patients daughter, [name]. Call unsuccessful.    Palliative care will DC services this week as PC has attempted to reach patient x3 months with no return call. Will make PCP/referral office aware.

## 2022-04-10 ENCOUNTER — Telehealth: Payer: Self-pay

## 2022-04-10 NOTE — Telephone Encounter (Signed)
3:40 PM: Palliative care SW outreached patient.   Call unsuccessful. SW left HIPPA compliant VM.  Awaiting return call.

## 2022-04-10 NOTE — Telephone Encounter (Signed)
Patient called returning a phone call to someone who left a voicemail stating for her to call back. CALLBACK # [phone]

## 2022-04-10 NOTE — Telephone Encounter (Signed)
She has an appt with Chelsea [name] on Monday, might've been a reminder call

## 2022-04-12 ENCOUNTER — Other Ambulatory Visit: Payer: Self-pay | Admitting: Internal Medicine

## 2022-04-12 DIAGNOSIS — F411 Generalized anxiety disorder: Secondary | ICD-10-CM

## 2022-04-13 ENCOUNTER — Inpatient Hospital Stay: Payer: Medicare HMO | Attending: Oncology | Admitting: Licensed Clinical Social Worker

## 2022-04-13 ENCOUNTER — Ambulatory Visit
Admission: RE | Admit: 2022-04-13 | Discharge: 2022-04-13 | Disposition: A | Discharge status: Home / Self Care | Payer: Medicare HMO | Source: Ambulatory Visit | Attending: Oncology | Admitting: Oncology

## 2022-04-13 DIAGNOSIS — J181 Lobar pneumonia, unspecified organism: Secondary | ICD-10-CM | POA: Diagnosis not present

## 2022-04-13 DIAGNOSIS — C189 Malignant neoplasm of colon, unspecified: Secondary | ICD-10-CM | POA: Diagnosis not present

## 2022-04-13 DIAGNOSIS — C787 Secondary malignant neoplasm of liver and intrahepatic bile duct: Secondary | ICD-10-CM | POA: Diagnosis not present

## 2022-04-13 DIAGNOSIS — C801 Malignant (primary) neoplasm, unspecified: Secondary | ICD-10-CM | POA: Diagnosis not present

## 2022-04-13 DIAGNOSIS — N200 Calculus of kidney: Secondary | ICD-10-CM | POA: Diagnosis not present

## 2022-04-13 DIAGNOSIS — Z5112 Encounter for antineoplastic immunotherapy: Secondary | ICD-10-CM | POA: Insufficient documentation

## 2022-04-13 DIAGNOSIS — Z5189 Encounter for other specified aftercare: Secondary | ICD-10-CM | POA: Insufficient documentation

## 2022-04-13 DIAGNOSIS — J8410 Pulmonary fibrosis, unspecified: Secondary | ICD-10-CM | POA: Diagnosis not present

## 2022-04-13 DIAGNOSIS — K760 Fatty (change of) liver, not elsewhere classified: Secondary | ICD-10-CM | POA: Diagnosis not present

## 2022-04-13 DIAGNOSIS — Z5111 Encounter for antineoplastic chemotherapy: Secondary | ICD-10-CM | POA: Insufficient documentation

## 2022-04-13 DIAGNOSIS — Z79899 Other long term (current) drug therapy: Secondary | ICD-10-CM | POA: Insufficient documentation

## 2022-04-13 DIAGNOSIS — C180 Malignant neoplasm of cecum: Secondary | ICD-10-CM | POA: Insufficient documentation

## 2022-04-13 MED ORDER — IOHEXOL 300 MG/ML  SOLN
100.0000 mL | Freq: Once | INTRAMUSCULAR | Status: AC | PRN
  Administered 2022-04-13: 100 mL via INTRAVENOUS

## 2022-04-13 NOTE — Telephone Encounter (Signed)
Requested medications are due for refill today.  yes  Requested medications are on the active medications list.  yes  Last refill. 03/11/2022 #60 0 refills  Future visit scheduled.   yes  Notes to clinic.  Refill is not delegated.    Requested Prescriptions  Pending Prescriptions Disp Refills   ALPRAZolam (XANAX) 0.5 MG tablet [Pharmacy Med Name: ALPRAZOLAM 0.5 MG TABLET] 60 tablet 0    Sig: TAKE 1 TABLET BY MOUTH TWICE A DAY AS NEEDED FOR ANXIETY     Not Delegated - Psychiatry: Anxiolytics/Hypnotics 2 Failed - 04/12/2022  2:08 PM      Failed - This refill cannot be delegated      Failed - Urine Drug Screen completed in last 360 days      Passed - Patient is not pregnant      Passed - Valid encounter within last 6 months    Recent Outpatient Visits           1 month ago Right leg weakness   [place], Coralie Keens, NP   5 months ago Pure hypercholesterolemia   [place], Coralie Keens, NP   7 months ago 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

## 2022-04-13 NOTE — Progress Notes (Signed)
[place] CSW Progress Note  Holiday representative met with patient to follow-up on treatment adjustment.  Patient reports she is doing well, but noticed she becomes more anxious the week she comes tot he cancer centr.  Patient stated she had her CAT scan today but does not want to know a timeline, if there cancer is still there. Patient gave CSW permission to update Dr. Tasia Catchings on this request.  Patient reported increased fatigue and difficulty with bowel movements.  Patient stated she is in good spirits and is planning a trip tp the beach with her daughter.  Patient stated. "I don't feel like I have cancer, and I'm thankful everyday when I wake up, everything is in [place]' time."  Patient rescheduled appointment for counseling on Aug 7th, 2023 at 11:00AM.  Patient will contact CSW before then, if she needs to speak with CSW about CAT scan results.    Adelene Amas, LCSW

## 2022-04-15 ENCOUNTER — Inpatient Hospital Stay: Payer: Medicare HMO

## 2022-04-15 ENCOUNTER — Encounter: Payer: Self-pay | Admitting: Oncology

## 2022-04-15 ENCOUNTER — Inpatient Hospital Stay (HOSPITAL_BASED_OUTPATIENT_CLINIC_OR_DEPARTMENT_OTHER): Payer: Medicare HMO | Admitting: Oncology

## 2022-04-15 DIAGNOSIS — J189 Pneumonia, unspecified organism: Secondary | ICD-10-CM | POA: Insufficient documentation

## 2022-04-15 DIAGNOSIS — C189 Malignant neoplasm of colon, unspecified: Secondary | ICD-10-CM | POA: Diagnosis not present

## 2022-04-15 DIAGNOSIS — Z79899 Other long term (current) drug therapy: Secondary | ICD-10-CM | POA: Diagnosis not present

## 2022-04-15 DIAGNOSIS — C787 Secondary malignant neoplasm of liver and intrahepatic bile duct: Secondary | ICD-10-CM | POA: Diagnosis not present

## 2022-04-15 DIAGNOSIS — C180 Malignant neoplasm of cecum: Secondary | ICD-10-CM | POA: Diagnosis not present

## 2022-04-15 DIAGNOSIS — Z5189 Encounter for other specified aftercare: Secondary | ICD-10-CM | POA: Diagnosis not present

## 2022-04-15 DIAGNOSIS — Z5112 Encounter for antineoplastic immunotherapy: Secondary | ICD-10-CM | POA: Diagnosis not present

## 2022-04-15 DIAGNOSIS — G629 Polyneuropathy, unspecified: Secondary | ICD-10-CM

## 2022-04-15 DIAGNOSIS — Z5111 Encounter for antineoplastic chemotherapy: Secondary | ICD-10-CM | POA: Diagnosis not present

## 2022-04-15 LAB — COMPREHENSIVE METABOLIC PANEL
ALT: 17 U/L (ref 0–44)
AST: 28 U/L (ref 15–41)
Albumin: 3.4 g/dL — ABNORMAL LOW (ref 3.5–5.0)
Alkaline Phosphatase: 86 U/L (ref 38–126)
Anion gap: 8 (ref 5–15)
BUN: 15 mg/dL (ref 8–23)
CO2: 29 mmol/L (ref 22–32)
Calcium: 9.1 mg/dL (ref 8.9–10.3)
Chloride: 103 mmol/L (ref 98–111)
Creatinine, Ser: 0.75 mg/dL (ref 0.44–1.00)
GFR, Estimated: >60 mL/min (ref >60)
Glucose, Bld: 126 mg/dL — ABNORMAL HIGH (ref 70–99)
Potassium: 3.3 mmol/L — ABNORMAL LOW (ref 3.5–5.1)
Sodium: 140 mmol/L (ref 135–145)
Total Bilirubin: 0.6 mg/dL (ref 0.3–1.2)
Total Protein: 7 g/dL (ref 6.5–8.1)

## 2022-04-15 LAB — CBC WITH DIFFERENTIAL/PLATELET
Abs Immature Granulocytes: 0.01 10*3/uL (ref 0.00–0.07)
Basophils Absolute: 0.1 10*3/uL (ref 0.0–0.1)
Basophils Relative: 1 %
Eosinophils Absolute: 0.1 10*3/uL (ref 0.0–0.5)
Eosinophils Relative: 1 %
HCT: 32 % — ABNORMAL LOW (ref 36.0–46.0)
Hemoglobin: 10.1 g/dL — ABNORMAL LOW (ref 12.0–15.0)
Immature Granulocytes: 0 %
Lymphocytes Relative: 28 %
Lymphs Abs: 1.4 10*3/uL (ref 0.7–4.0)
MCH: 32.8 pg (ref 26.0–34.0)
MCHC: 31.6 g/dL (ref 30.0–36.0)
MCV: 103.9 fL — ABNORMAL HIGH (ref 80.0–100.0)
Monocytes Absolute: 0.6 10*3/uL (ref 0.1–1.0)
Monocytes Relative: 12 %
Neutro Abs: 3 10*3/uL (ref 1.7–7.7)
Neutrophils Relative %: 58 %
Platelets: 237 10*3/uL (ref 150–400)
RBC: 3.08 MIL/uL — ABNORMAL LOW (ref 3.87–5.11)
RDW: 16.4 % — ABNORMAL HIGH (ref 11.5–15.5)
WBC: 5.1 10*3/uL (ref 4.0–10.5)
nRBC: 0 % (ref 0.0–0.2)

## 2022-04-15 MED ORDER — LEVOFLOXACIN 500 MG PO TABS: 500.0000 mg | ORAL_TABLET | Freq: Every day | ORAL | 0 refills | Status: DC

## 2022-04-15 MED ORDER — POTASSIUM CHLORIDE CRYS ER 20 MEQ PO TBCR: 20.0000 meq | EXTENDED_RELEASE_TABLET | Freq: Every day | ORAL | 1 refills | Status: DC

## 2022-04-15 MED ORDER — HEPARIN SOD (PORK) LOCK FLUSH 100 UNIT/ML IV SOLN
500.0000 [IU] | Freq: Once | INTRAVENOUS | Status: AC
  Administered 2022-04-15: 500 [IU] via INTRAVENOUS
  Filled 2022-04-15: qty 5

## 2022-04-15 MED ORDER — SODIUM CHLORIDE 0.9% FLUSH
10.0000 mL | Freq: Once | INTRAVENOUS | Status: AC
  Administered 2022-04-15: 10 mL via INTRAVENOUS
  Filled 2022-04-15: qty 10

## 2022-04-15 NOTE — Assessment & Plan Note (Signed)
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 Continue gabapentin 300 mg twice daily.

## 2022-04-15 NOTE — Assessment & Plan Note (Signed)
Recommend Levaquin 500 mg daily for 10 days. Advised patient to seek medical advice if she spikes fever, shortness of breath/chest pain or worsening of cough.

## 2022-04-15 NOTE — Assessment & Plan Note (Addendum)
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  No sufficient tissue for NGS Liquid biopsy showed [id number], APC, TP53 mutation, TMB 4.  S/p 6 cycles of 5-FU /Bevacizumab--> CT showed mixed response--> FOLFOX/bevacizumab.  Labs reviewed and discussed with patient hold chemotherapy FOLFOX bevacizumab today-see below. CT imaging shows stable disease.  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  Stable disease.

## 2022-04-15 NOTE — Progress Notes (Signed)
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  No sufficient tissue for NGS Liquid biopsy showed KRAS12V, APC, TP53 mutation, TMB 4.  S/p 6 cycles of 5-FU /Bevacizumab--> CT showed mixed response--> FOLFOX/bevacizumab.  Labs reviewed and discussed with patient hold chemotherapy FOLFOX bevacizumab today-see below. CT imaging shows stable disease.  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  Stable disease.  Community acquired pneumonia Recommend Levaquin 500 mg daily for 10 days. Advised patient to seek medical advice if she spikes fever, shortness of breath/chest pain or worsening of cough.  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 Continue gabapentin 300 mg twice daily.   No orders of the defined types were placed in this encounter.  Follow-up in 2 weeks lab [name]. All questions were answered. The patient knows to call the clinic with any problems, questions or concerns.  [name], MD, PhD [facility] Hematology Oncology 04/15/2022   CHIEF COMPLAINTS/REASON FOR VISIT:  metastatic colon cancer  HISTORY OF PRESENTING ILLNESS:   Chelsea [name] is a  76 y.o.  female with PMH listed below was seen in consultation at the request of  Jearld Fenton, NP  for evaluation of metastatic colon cancer.  Patient initially went to emergency room on 03/24/2021 for abdominal pain. 03/24/2021, CT scan of the abdomen showed marked bladder wall thickening with surrounding soft tissue stranding is noted.  Concerning for  cystitis.  There is a lobulated mass between the posterior wall of the bladder and the rectum which appears to arise from the vaginal cuff.  This is indeterminate and difficult to characterize reflecting lack of IV contrast material.  Nonobstructing left renal calculus, left sacroiliitis, lumbar spondylosis aortic atherosclerosis. 03/24/2021 CT pelvic showed abdominal [place] soft tissue of right cecum, concerning for colon carcinoma.  Colonoscopy recommended.  Low-attenuation mass in the region of vaginal cuff is again noted.  Diffuse urinary bladder wall thickening and mild bilateral hydroureter possibly cystitis.  [place] 03/24/2021 Lobulated solid 3.8 cm mass confirmed by ultrasound. This is most compatible with a neoplasm but remains indeterminate as to the origin as the epicenter seems to be outside of both the vaginal cuff and the adjacent colon, while the lesion appears inseparable from both. Extensive echogenic debris within the distended urinary bladder.   03/26/2021, patient was seen by Dr. Theora Gianotti for evaluation of colonic/vaginal cuff pelvic mass.  Patient also was referred to gastroenterology for colonoscopy.  03/26/2021 CEA 3.4.  CA125 9.7 04/04/2021, status post colonoscopy which showed a frond like /villous nonobstructing large mass was found in the cecum, this was biopsied.  Terminal ileum was briefly intubated and appeared normal.  Diverticulosis in the sigmoid colon.  The rectum, sigmoid colon, descending colon, transverse colon and ascending colon were normal.  Nonbleeding internal hemorrhoids. 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    Given that the biopsy may not be representative of the entire underlying lesion.  Patient was recommended to establish with surgeon for evaluation. Patient no showed for her surgery appointment and as well as her follow-up appointment with gastroenterology.  Per daughter, patient was having cervical  spine surgery and she  prioritized that over the colon surgery.  08/19/2021, patient establish care with surgery Dr. Christian Mate 08/21/2021, CT abdomen pelvis with contrast  showed soft tissue mass at the base of cecum measuring up to 5 cm, slightly increased in size.  Interval development of multiple low-density lesions throughout the liver highly suggestive for metastatic disease.  Complex mass in the region of the vaginal cuff measuring up to 5.2 cm, increased in size.  Right ovary also appears more prominent on the current examination.  Moderate large volume of stool throughout the colon.  Mild urinary bladder wall thickening.  Colonic diverticulosis without evidence of active diverticulitis.  Aortic atherosclerosis  09/04/2021, patient is status post left lobe liver lesion biopsy and pathology showed metastatic adenocarcinoma, compatible with colorectal primary.  She was referred to establish care with oncology. She was accompanied by her daughter today.  Patient walks with a walker.+ Night sweats + weight loss.  Denies any abdominal pain currently, melena, blood in the stool.  She is currently being followed by home health PT/OT following a previous spinal surgery  10/10/2020, status post Mediport placement. 10/20/2020, cycle one 5-FU/bevacizumab.  Foundation one liquid biopsy testing showed KRASG12V, APC TP53 TMB 4  12/22/2021 CT chest abdomen pelvis Decreased cecum mass, however increased size and size of metastatic lesions throughout the liver.  Interval enlargement of vaginal cuff mass. No mets in chest.   # During the interval, patient was seen by gynecology oncology Dr. Fransisca Connors.  Biopsy of the vaginal mass was not recommended. Patient's case was also discussed on multidisciplinary tumor board.  IR potentially can try a biopsy if patient is willing to. I had a discussion with patient's daughter over the phone prior to this visit.  We discussed about option of adding oxaliplatin to 5-FU/bevacizumab and continue  treatment of colon cancer, repeat CT scan short-term and if progression, repeat biopsy versus proceeding with vaginal mass biopsy.  Daughter prefers proceeding with chemotherapy and defer biopsy.   01/07/22, FOLFOX/bev  01/08/2022 patient has had a second opinion at [facility] and was seen by Dr. Reynaldo Minium .  Dr. Reynaldo Minium agrees with the current treatment plan with FOLFOX/bevacizumab.  He has ordered guardant 360 to see if she may be eligible for clinical trial in the future.     INTERVAL HISTORY Chelsea [name] is a 76 y.o. female who has above history reviewed by me today presents for follow up visit for management of metastatic colon cancer Patient was accompanied by her son-in-law. Patient feels weak today.  No fever chills, cough, dysuria.  She recently attended a family reunion party. 04/13/2022, CT chest abdomen pelvis showed a cecal mass with stable hepatic metastasis.  Mixed cystic and solid mass in the region of the vaginal cuff, new area of patchy groundglass and consolidation in the right upper lobe, lingula and left lower lobe.  Likely due to pneumonia.  Hepatic steatosis.  Bilateral renal stones.  Aortic atherosclerosis   Review of Systems  Constitutional:  Positive for fatigue. Negative for appetite change, chills, fever and unexpected weight change.  HENT:   Negative for hearing loss and voice change.   Eyes:  Negative for eye problems.  Respiratory:  Negative for chest tightness and cough.   Cardiovascular:  Negative for chest pain.  Gastrointestinal:  Negative for abdominal distention, abdominal pain, blood in stool and diarrhea.  Endocrine: Negative for hot flashes.  Genitourinary:  Negative for difficulty urinating and frequency.   Musculoskeletal:  Positive for back pain.  Negative for arthralgias.  Skin:  Negative for itching and rash.  Neurological:  Positive for numbness (chornic finger tips). Negative for extremity weakness.  Hematological:  Negative for adenopathy.   Psychiatric/Behavioral:  Negative for confusion.     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)    Moderate mitral insufficiency    Port-A-Cath in place    Seizures [facility])    Spinal stenosis    Ulcer    Urinary incontinence     SURGICAL HISTORY: Past Surgical History:  Procedure Laterality Date   ABDOMINAL HYSTERECTOMY     BACK SURGERY     due to polio   BREAST BIOPSY Bilateral    neg   BREAST BIOPSY Right 2011   neg/stereo   CARPAL TUNNEL RELEASE     CATARACT EXTRACTION Right 2020   COLONOSCOPY WITH PROPOFOL N/A 04/04/2021   Procedure: COLONOSCOPY WITH PROPOFOL;  Surgeon: Virgel Manifold, MD;  Location: [facility] ENDOSCOPY;  Service: Endoscopy;  Laterality: N/A;   EYE SURGERY     IR IMAGING GUIDED PORT INSERTION  10/09/2021    SOCIAL HISTORY: Social History   Socioeconomic History   Marital status: Divorced    Spouse name: Not on file   Number of children: Not on file   Years of education: Not on file   Highest education level: Not on file  Occupational History   Not on file  Tobacco Use   Smoking status: Never   Smokeless tobacco: Never  Vaping Use   Vaping Use: Never used  Substance and Sexual Activity   Alcohol use: Never   Drug use: No   Sexual activity: Not Currently  Other Topics Concern   Not on file  Social History Narrative   Not on file   Social Determinants of Health   Financial Resource Strain: Low Risk  (01/12/2022)   Overall Financial Resource Strain (CARDIA)    Difficulty of Paying Living Expenses: Not very hard  Food Insecurity: No Food Insecurity (01/12/2022)   Hunger Vital Sign    Worried About Running Out of Food in the Last Year: Never true    Ran Out of Food in the Last Year: Never true  Transportation Needs: No Transportation Needs (01/12/2022)   PRAPARE - Radiographer, therapeutic (Medical): No    Lack of Transportation (Non-Medical): No  Physical Activity: Inactive (01/12/2022)   Exercise Vital Sign    Days of Exercise per Week: 0 days    Minutes of Exercise per Session: 0 min  Stress: Stress Concern Present (01/12/2022)   [place]    Feeling of Stress : To some extent  Social Connections: Moderately Integrated (01/12/2022)   Social Connection and Isolation Panel [NHANES]    Frequency of Communication with Friends and Family: Three times a week    Frequency of Social Gatherings with Friends and Family: Three times a week    Attends Religious Services: 1 to 4 times per year    Active Member of Clubs or Organizations: No    Attends Archivist Meetings: 1 to 4 times per year    Marital Status: Widowed  Intimate Partner Violence: Not At Risk (01/12/2022)   Humiliation, Afraid, Rape, and Kick questionnaire    Fear  of Current or Ex-Partner: No    Emotionally Abused: No    Physically Abused: No    Sexually Abused: No    FAMILY HISTORY: Family History  Problem Relation Age of Onset   Arthritis Mother    Heart disease Mother    Stroke Mother    Hypertension Mother    COPD Mother    Sudden death Sister    Other Father        unknown medical history   Breast cancer Neg Hx     ALLERGIES:  is allergic to penicillins.  MEDICATIONS:  Current Outpatient Medications  Medication Sig Dispense Refill   acetaminophen (TYLENOL) 500 MG tablet Take 500 mg by mouth every 6 (six) hours as needed.     ALPRAZolam (XANAX) 0.5 MG tablet TAKE 1 TABLET BY MOUTH TWICE A DAY AS NEEDED FOR ANXIETY 60 tablet 0   aspirin 325 MG tablet Take 325 mg by mouth daily.     atorvastatin (LIPITOR) 10 MG tablet TAKE 1 TABLET BY MOUTH EVERY DAY 90 tablet 0   busPIRone (BUSPAR) 5 MG tablet Take 1 tablet (5 mg total) by mouth 3 (three) times daily. 270 tablet 1   Cyanocobalamin (VITAMIN B 12 PO) Take 500  mcg by mouth daily.     diclofenac Sodium (VOLTAREN) 1 % GEL Apply 4 g topically 4 (four) times daily. 4 g 0   docusate sodium (COLACE) 100 MG capsule Take 1 capsule (100 mg total) by mouth 2 (two) times daily. 60 capsule 0   FLUoxetine (PROZAC) 20 MG capsule TAKE 3 CAPSULES (60 MG TOTAL) BY MOUTH EVERY MORNING. 270 capsule 0   gabapentin (NEURONTIN) 300 MG capsule Take 1 capsule (300 mg total) by mouth 2 (two) times daily. 180 capsule 1   levofloxacin (LEVAQUIN) 500 MG tablet Take 1 tablet (500 mg total) by mouth daily. 10 tablet 0   lidocaine (LIDODERM) 5 % Place 1 patch onto the skin daily. Remove & Discard patch within 12 hours or as directed by MD 1 patch 0   lidocaine-prilocaine (EMLA) cream Apply small amount of cream to port site 1-2 hour prior to chemo treatment. 30 g 3   loperamide (IMODIUM) 2 MG capsule TAKE 1 CAP BY MOUTH SEE ADMIN INSTRUCTIONS. INITIAL: 4 MG, FOLLOWED BY 2 MG AFTER EACH LOOSE STOOL MAXIMUM: 16 MG/DAY 60 capsule 0   loratadine (CLARITIN) 10 MG tablet Take 1 tablet (10 mg total) by mouth See admin instructions. Take 1 tablet daily for 4 days after each chemotherapy treatments. 30 tablet 2   meclizine (ANTIVERT) 12.5 MG tablet Take 1 tablet (12.5 mg total) by mouth 3 (three) times daily as needed for dizziness. 30 tablet 0   methocarbamol (ROBAXIN) 500 MG tablet TAKE 1 TABLET TWICE DAILY AS NEEDED FOR MUSCLE SPASM(S) 60 tablet 0   ondansetron (ZOFRAN) 8 MG tablet Take 1 tablet (8 mg total) by mouth 2 (two) times daily as needed (Nausea or vomiting). 30 tablet 1   polyethylene glycol powder (GLYCOLAX/MIRALAX) 17 GM/SCOOP powder Take 17 g by mouth daily. 3350 g 1   prochlorperazine (COMPAZINE) 10 MG tablet Take 1 tablet (10 mg total) by mouth every 6 (six) hours as needed (Nausea or vomiting). 30 tablet 1   triamterene-hydrochlorothiazide (MAXZIDE-25) 37.5-25 MG tablet TAKE 1 TABLET BY MOUTH EVERY DAY 90 tablet 0   vitamin E 180 MG (400 UNITS) capsule Take 400 Units by mouth  daily.     potassium chloride SA (KLOR-CON M20) 20 MEQ tablet  Take 1 tablet (20 mEq total) by mouth daily. 30 tablet 1   No current facility-administered medications for this visit.     PHYSICAL EXAMINATION: ECOG PERFORMANCE STATUS: 2 - Symptomatic, <50% confined to bed Vitals:   04/15/22 0905  BP: (!) 116/58  Pulse: 71  Temp: (!) 96.8 F (36 C)    Filed Weights   04/15/22 0905  Weight: 168 lb (76.2 kg)      Physical Exam Constitutional:      General: She is not in acute distress.    Comments: Patient sits in wheelchair  HENT:     Head: Normocephalic and atraumatic.  Eyes:     General: No scleral icterus. Cardiovascular:     Rate and Rhythm: Normal rate and regular rhythm.     Heart sounds: Normal heart sounds.  Pulmonary:     Effort: Pulmonary effort is normal. No respiratory distress.     Breath sounds: No wheezing.  Abdominal:     General: Bowel sounds are normal. There is no distension.     Palpations: Abdomen is soft.  Musculoskeletal:        General: No deformity. Normal range of motion.     Cervical back: Normal range of motion and neck supple.  Skin:    General: Skin is warm and dry.     Findings: No erythema or rash.  Neurological:     Mental Status: She is alert and oriented to person, place, and time. Mental status is at baseline.     Cranial Nerves: No cranial nerve deficit.     Coordination: Coordination normal.  Psychiatric:        Mood and Affect: Mood normal.     LABORATORY DATA:  I have reviewed the data as listed Lab Results  Component Value Date   WBC 5.1 04/15/2022   HGB 10.1 (L) 04/15/2022   HCT 32.0 (L) 04/15/2022   MCV 103.9 (H) 04/15/2022   PLT 237 04/15/2022   Recent Labs    03/11/22 0832 03/25/22 0809 04/15/22 0836  NA 138 139 140  K 3.4* 3.4* 3.3*  CL 101 102 103  CO2 31 28 29   GLUCOSE 114* 126* 126*  BUN 15 14 15   CREATININE 0.76 1.18* 0.75  CALCIUM 8.8* 9.2 9.1  GFRNONAA >60 48* >60  PROT 7.2 7.4 7.0   ALBUMIN 3.4* 3.5 3.4*  AST 21 24 28   ALT 13 14 17   ALKPHOS 119 128* 86  BILITOT 0.3 0.5 0.6    Iron/TIBC/Ferritin/ %Sat    Component Value Date/Time   FERRITIN 58 10/24/2020 0402       RADIOGRAPHIC STUDIES: I have personally reviewed the radiological images as listed and agreed with the findings in the report. CT CHEST ABDOMEN PELVIS W CONTRAST  Result Date: 04/13/2022 CLINICAL DATA:  Colon mass, liver cancer diagnosed December 2022. * Tracking Code: BO * EXAM: CT CHEST, ABDOMEN, AND PELVIS WITH CONTRAST TECHNIQUE: Multidetector CT imaging of the chest, abdomen and pelvis was performed following the standard protocol during bolus administration of intravenous contrast. RADIATION DOSE REDUCTION: This exam was performed according to the departmental dose-optimization program which includes automated exposure control, adjustment of the mA and/or kV according to patient size and/or use of iterative reconstruction technique. CONTRAST:  175m OMNIPAQUE IOHEXOL 300 MG/ML  SOLN COMPARISON:  12/22/2021. FINDINGS: CT CHEST FINDINGS Cardiovascular: Left IJ Port-A-Cath terminates in the right atrium. Atherosclerotic calcification of the aorta and aortic valve. Heart is enlarged. No pericardial effusion. Mediastinum/Nodes: No pathologically enlarged  mediastinal, hilar or axillary lymph nodes. Calcified mediastinal and hilar lymph nodes. Esophagus is unremarkable. Lungs/Pleura: Calcified granulomas. Calcified pleural plaques in the right hemithorax. New areas of ground-glass and consolidation in the right upper lobe, lingula and left lower lobe. No pleural fluid. Airway is unremarkable. Musculoskeletal: T8 and T9 vertebral body fusion with a kyphotic deformity. Degenerative changes in the spine. No worrisome lytic or sclerotic lesions. CT ABDOMEN PELVIS FINDINGS Hepatobiliary: Liver is decreased in attenuation diffusely. Mildly hypoattenuating lesions in the liver measure up to 2.3 cm in the left hepatic  lobe, unchanged. Gallbladder is mildly distended. No biliary ductal dilatation. Pancreas: Negative. Spleen: Negative. Adrenals/Urinary Tract: Adrenal glands are unremarkable. Scarring in the kidneys bilaterally. Bilateral renal stones. Ureters are decompressed. Left posterolateral bladder diverticulum. Bladder is otherwise grossly unremarkable. Stomach/Bowel: Stomach, small bowel and appendix are unremarkable. Eccentric irregular wall thickening in the cecum (2/68), similar. Stool is seen in the majority of the colon, indicative of constipation. Vascular/Lymphatic: Atherosclerotic calcification of the aorta. No pathologically enlarged lymph nodes. Gastrohepatic ligament lymph nodes are subcentimeter in short axis size. Reproductive: Hysterectomy. Heterogeneous mixed cystic and solid mass in the region of the vaginal cuff measures 3.8 x 5.6 cm, stable from 12/22/2021. Other: No free fluid.  Mesenteries and peritoneum are unremarkable. Musculoskeletal: Degenerative changes in the spine. No worrisome lytic or sclerotic lesions. IMPRESSION: 1. 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 2. Mixed cystic and solid mass in the region of the vaginal cuff, worrisome for malignancy. 3. New areas of patchy ground-glass and consolidation in the right upper lobe, lingula and left lower lobe, likely due to pneumonia. Treatment later organizing pneumonia not excluded. 4. Hepatic steatosis. 5. Bilateral renal stones. 6.  Aortic atherosclerosis (ICD10-I70.0). Electronically Signed   By: Lorin Picket M.D.   On: 04/13/2022 16:26   DG Lumbar Spine Complete  Result Date: 02/20/2022 CLINICAL DATA:  Low back pain EXAM: LUMBAR SPINE - COMPLETE 4+ VIEW COMPARISON:  None Available. FINDINGS: Lumbar vertebral body height are preserved without evidence of fracture. Grade 1 anterolisthesis of L4 on L5. Moderate intervertebral disc space narrowing at L5-S1. Facet arthropathy throughout the lumbar spine. No spondylolysis identified.  IMPRESSION: Degenerative changes of the lumbar spine with no acute fracture identified. Electronically Signed   By: Ofilia Neas M.D.   On: 02/20/2022 15:46   DG Shoulder Right  Result Date: 02/20/2022 CLINICAL DATA:  Right shoulder pain EXAM: RIGHT SHOULDER - 2+ VIEW COMPARISON:  Right shoulder x-ray 07/08/2019 FINDINGS: No acute fracture or dislocation identified. Moderate to severe narrowing of the glenohumeral joint with large inferior marginal osteophytes. Mild narrowing of the acromioclavicular joint. Small likely subcortical cysts at the greater tuberosity of the humerus. IMPRESSION: Degenerative changes of the shoulder as described. Electronically Signed   By: Ofilia Neas M.D.   On: 02/20/2022 15:45

## 2022-04-16 LAB — CEA: CEA: <0.6 ng/mL (ref 0.0–4.7)

## 2022-04-17 ENCOUNTER — Inpatient Hospital Stay: Payer: Medicare HMO

## 2022-04-28 ENCOUNTER — Ambulatory Visit (INDEPENDENT_AMBULATORY_CARE_PROVIDER_SITE_OTHER): Payer: Medicare HMO | Admitting: Internal Medicine

## 2022-04-28 ENCOUNTER — Encounter: Payer: Self-pay | Admitting: Internal Medicine

## 2022-04-28 VITALS — BP 118/74 | HR 71 | Temp 96.8°F | Wt 168.0 lb

## 2022-04-28 DIAGNOSIS — E6609 Other obesity due to excess calories: Secondary | ICD-10-CM

## 2022-04-28 DIAGNOSIS — Z780 Asymptomatic menopausal state: Secondary | ICD-10-CM

## 2022-04-28 DIAGNOSIS — I700 Atherosclerosis of aorta: Secondary | ICD-10-CM | POA: Diagnosis not present

## 2022-04-28 DIAGNOSIS — Z6836 Body mass index (BMI) 36.0-36.9, adult: Secondary | ICD-10-CM

## 2022-04-28 DIAGNOSIS — R7303 Prediabetes: Secondary | ICD-10-CM

## 2022-04-28 DIAGNOSIS — Z1231 Encounter for screening mammogram for malignant neoplasm of breast: Secondary | ICD-10-CM | POA: Diagnosis not present

## 2022-04-28 DIAGNOSIS — Z0001 Encounter for general adult medical examination with abnormal findings: Secondary | ICD-10-CM

## 2022-04-28 DIAGNOSIS — Z23 Encounter for immunization: Secondary | ICD-10-CM | POA: Diagnosis not present

## 2022-04-28 DIAGNOSIS — Z79899 Other long term (current) drug therapy: Secondary | ICD-10-CM | POA: Diagnosis not present

## 2022-04-28 NOTE — Patient Instructions (Signed)
Health Maintenance for Postmenopausal Women Menopause is a normal process in which your ability to get pregnant comes to an end. This process happens slowly over many months or years, usually between the ages of 48 and 55. Menopause is complete when you have missed your menstrual period for 12 months. It is important to talk with your health care provider about some of the most common conditions that affect women after menopause (postmenopausal women). These include heart disease, cancer, and bone loss (osteoporosis). Adopting a healthy lifestyle and getting preventive care can help to promote your health and wellness. The actions you take can also lower your chances of developing some of these common conditions. What are the signs and symptoms of menopause? During menopause, you may have the following symptoms: Hot flashes. These can be moderate or severe. Night sweats. Decrease in sex drive. Mood swings. Headaches. Tiredness (fatigue). Irritability. Memory problems. Problems falling asleep or staying asleep. Talk with your health care provider about treatment options for your symptoms. Do I need hormone replacement therapy? Hormone replacement therapy is effective in treating symptoms that are caused by menopause, such as hot flashes and night sweats. Hormone replacement carries certain risks, especially as you become older. If you are thinking about using estrogen or estrogen with progestin, discuss the benefits and risks with your health care provider. How can I reduce my risk for heart disease and stroke? The risk of heart disease, heart attack, and stroke increases as you age. One of the causes may be a change in the body's hormones during menopause. This can affect how your body uses dietary fats, triglycerides, and cholesterol. Heart attack and stroke are medical emergencies. There are many things that you can do to help prevent heart disease and stroke. Watch your blood pressure High  blood pressure causes heart disease and increases the risk of stroke. This is more likely to develop in people who have high blood pressure readings or are overweight. Have your blood pressure checked: Every 3-5 years if you are 18-39 years of age. Every year if you are 40 years old or older. Eat a healthy diet  Eat a diet that includes plenty of vegetables, fruits, low-fat dairy products, and lean protein. Do not eat a lot of foods that are high in solid fats, added sugars, or sodium. Get regular exercise Get regular exercise. This is one of the most important things you can do for your health. Most adults should: Try to exercise for at least 150 minutes each week. The exercise should increase your heart rate and make you sweat (moderate-intensity exercise). Try to do strengthening exercises at least twice each week. Do these in addition to the moderate-intensity exercise. Spend less time sitting. Even light physical activity can be beneficial. Other tips Work with your health care provider to achieve or maintain a healthy weight. Do not use any products that contain nicotine or tobacco. These products include cigarettes, chewing tobacco, and vaping devices, such as e-cigarettes. If you need help quitting, ask your health care provider. Know your numbers. Ask your health care provider to check your cholesterol and your blood sugar (glucose). Continue to have your blood tested as directed by your health care provider. Do I need screening for cancer? Depending on your health history and family history, you may need to have cancer screenings at different stages of your life. This may include screening for: Breast cancer. Cervical cancer. Lung cancer. Colorectal cancer. What is my risk for osteoporosis? After menopause, you may be   at increased risk for osteoporosis. Osteoporosis is a condition in which bone destruction happens more quickly than new bone creation. To help prevent osteoporosis or  the bone fractures that can happen because of osteoporosis, you may take the following actions: If you are 19-50 years old, get at least 1,000 mg of calcium and at least 600 international units (IU) of vitamin D per day. If you are older than age 50 but younger than age 70, get at least 1,200 mg of calcium and at least 600 international units (IU) of vitamin D per day. If you are older than age 70, get at least 1,200 mg of calcium and at least 800 international units (IU) of vitamin D per day. Smoking and drinking excessive alcohol increase the risk of osteoporosis. Eat foods that are rich in calcium and vitamin D, and do weight-bearing exercises several times each week as directed by your health care provider. How does menopause affect my mental health? Depression may occur at any age, but it is more common as you become older. Common symptoms of depression include: Feeling depressed. Changes in sleep patterns. Changes in appetite or eating patterns. Feeling an overall lack of motivation or enjoyment of activities that you previously enjoyed. Frequent crying spells. Talk with your health care provider if you think that you are experiencing any of these symptoms. General instructions See your health care provider for regular wellness exams and vaccines. This may include: Scheduling regular health, dental, and eye exams. Getting and maintaining your vaccines. These include: Influenza vaccine. Get this vaccine each year before the flu season begins. Pneumonia vaccine. Shingles vaccine. Tetanus, diphtheria, and pertussis (Tdap) booster vaccine. Your health care provider may also recommend other immunizations. Tell your health care provider if you have ever been abused or do not feel safe at home. Summary Menopause is a normal process in which your ability to get pregnant comes to an end. This condition causes hot flashes, night sweats, decreased interest in sex, mood swings, headaches, or lack  of sleep. Treatment for this condition may include hormone replacement therapy. Take actions to keep yourself healthy, including exercising regularly, eating a healthy diet, watching your weight, and checking your blood pressure and blood sugar levels. Get screened for cancer and depression. Make sure that you are up to date with all your vaccines. This information is not intended to replace advice given to you by your health care provider. Make sure you discuss any questions you have with your health care provider. Document Revised: 02/10/2021 Document Reviewed: 02/10/2021 Elsevier Patient Education  2023 [employer] Inc[employer]  

## 2022-04-28 NOTE — Progress Notes (Signed)
Subjective:    Patient ID: Chelsea [name], female    DOB: 14-Nov-1945, 76 y.o.   MRN: [id number]  HPI  Patient presents to clinic today for her annual exam.  Flu: never Tetanus: unsure COVID: Pfizer x4 Pneumovax: never Prevnar: never Shingrix: never Pap smear: Hysterectomy Mammogram: 01/2021 Bone density: 03/2018 Colon screening: 04/2021 Vision screening: as needed Dentist: as needed  Diet: She does eat some meat. She consumes fruits and veggies. She does eat some fried foods. She drinks mostly water, ensure. 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     ALPRAZolam (XANAX) 0.5 MG tablet TAKE 1 TABLET BY MOUTH TWICE A DAY AS NEEDED FOR ANXIETY 60 tablet 0   aspirin 325 MG tablet Take 325 mg by mouth daily.     atorvastatin (LIPITOR) 10 MG tablet TAKE 1 TABLET BY MOUTH EVERY DAY 90 tablet 0   busPIRone (BUSPAR) 5 MG tablet Take 1 tablet (5 mg total) by mouth 3 (three) times daily. 270 tablet 1   Cyanocobalamin (VITAMIN B 12 PO) Take 500 mcg by mouth daily.     diclofenac Sodium (VOLTAREN) 1 % GEL Apply 4 g topically 4 (four) times daily. 4 g 0   docusate sodium (COLACE) 100 MG capsule Take 1 capsule (100 mg total) by mouth 2 (two) times daily. 60 capsule 0   FLUoxetine (PROZAC) 20 MG capsule TAKE 3 CAPSULES (60 MG TOTAL) BY MOUTH EVERY MORNING. 270 capsule 0   gabapentin (NEURONTIN) 300 MG capsule Take 1 capsule (300 mg total) by mouth 2 (two) times daily. 180 capsule 1   levofloxacin (LEVAQUIN) 500 MG tablet Take 1 tablet  (500 mg total) by mouth daily. 10 tablet 0   lidocaine (LIDODERM) 5 % Place 1 patch onto the skin daily. Remove & Discard patch within 12 hours or as directed by MD 1 patch 0   lidocaine-prilocaine (EMLA) cream Apply small amount of cream to port site 1-2 hour prior to chemo treatment. 30 g 3   loperamide (IMODIUM) 2 MG capsule TAKE 1 CAP BY MOUTH SEE ADMIN INSTRUCTIONS. INITIAL: 4 MG, FOLLOWED BY 2 MG AFTER EACH LOOSE STOOL MAXIMUM: 16 MG/DAY 60 capsule 0   loratadine (CLARITIN) 10 MG tablet Take 1 tablet (10 mg total) by mouth See admin instructions. Take 1 tablet daily for 4 days after each chemotherapy treatments. 30 tablet 2   meclizine (ANTIVERT) 12.5 MG tablet Take 1 tablet (12.5 mg total) by mouth 3 (three) times daily as needed for dizziness. 30 tablet 0   methocarbamol (ROBAXIN) 500 MG tablet TAKE 1 TABLET TWICE DAILY AS NEEDED FOR MUSCLE SPASM(S) 60 tablet 0   ondansetron (ZOFRAN) 8 MG tablet Take 1 tablet (8 mg total) by mouth 2 (two) times daily as needed (Nausea or vomiting). 30 tablet 1   polyethylene glycol powder (GLYCOLAX/MIRALAX) 17 GM/SCOOP powder Take 17 g by mouth daily. 3350 g 1   potassium  chloride SA (KLOR-CON M20) 20 MEQ tablet Take 1 tablet (20 mEq total) by mouth daily. 30 tablet 1   prochlorperazine (COMPAZINE) 10 MG tablet Take 1 tablet (10 mg total) by mouth every 6 (six) hours as needed (Nausea or vomiting). 30 tablet 1   triamterene-hydrochlorothiazide (MAXZIDE-25) 37.5-25 MG tablet TAKE 1 TABLET BY MOUTH EVERY DAY 90 tablet 0   vitamin E 180 MG (400 UNITS) capsule Take 400 Units by mouth daily.     No current facility-administered medications for this visit.    Allergies  Allergen Reactions   Penicillins     "Everything turned black"    Family History  Problem Relation Age of Onset   Arthritis Mother    Heart disease Mother    Stroke Mother    Hypertension Mother    COPD Mother    Sudden death Sister    Other Father        unknown medical history    Breast cancer Neg Hx     Social History   Socioeconomic History   Marital status: Divorced    Spouse name: Not on file   Number of children: Not on file   Years of education: Not on file   Highest education level: Not on file  Occupational History   Not on file  Tobacco Use   Smoking status: Never   Smokeless tobacco: Never  Vaping Use   Vaping Use: Never used  Substance and Sexual Activity   Alcohol use: Never   Drug use: No   Sexual activity: Not Currently  Other Topics Concern   Not on file  Social History Narrative   Not on file   Social Determinants of Health   Financial Resource Strain: Low Risk  (01/12/2022)   Overall Financial Resource Strain (CARDIA)    Difficulty of Paying Living Expenses: Not very hard  Food Insecurity: No Food Insecurity (01/12/2022)   Hunger Vital Sign    Worried About Running Out of Food in the Last Year: Never true    Ran Out of Food in the Last Year: Never true  Transportation Needs: No Transportation Needs (01/12/2022)   PRAPARE - Hydrologist (Medical): No    Lack of Transportation (Non-Medical): No  Physical Activity: Inactive (01/12/2022)   Exercise Vital Sign    Days of Exercise per Week: 0 days    Minutes of Exercise per Session: 0 min  Stress: Stress Concern Present (01/12/2022)   [place]    Feeling of Stress : To some extent  Social Connections: Moderately Integrated (01/12/2022)   Social Connection and Isolation Panel [NHANES]    Frequency of Communication with Friends and Family: Three times a week    Frequency of Social Gatherings with Friends and Family: Three times a week    Attends Religious Services: 1 to 4 times per year    Active Member of Clubs or Organizations: No    Attends Archivist Meetings: 1 to 4 times per year    Marital Status: Widowed  Intimate Partner Violence: Not At Risk (01/12/2022)   Humiliation,  Afraid, Rape, and Kick questionnaire    Fear of Current or Ex-Partner: No    Emotionally Abused: No    Physically Abused: No    Sexually Abused: No     Constitutional: Patient reports fatigue and unintentional weight loss.  Denies fever, malaise, headache.  HEENT: Denies eye pain, eye redness, ear pain, ringing  in the ears, wax buildup, runny nose, nasal congestion, bloody nose, or sore throat. Respiratory: Denies difficulty breathing, shortness of breath, cough or sputum production.   Cardiovascular: Denies chest pain, chest tightness, palpitations or swelling in the hands or feet.  Gastrointestinal: Denies abdominal pain, bloating, constipation, diarrhea or blood in the stool.  GU: Denies urgency, frequency, pain with urination, burning sensation, blood in urine, odor or discharge. Musculoskeletal: Patient reports chronic back pain.  Denies decrease in range of motion, difficulty with gait, or joint swelling.  Skin: Denies redness, rashes, lesions or ulcercations.  Neurological: Patient reports neuropathic pain.  Denies dizziness, difficulty with memory, difficulty with speech or problems with balance and coordination.  Psych: Patient has a history of anxiety and depression.  Denies SI/HI.  No other specific complaints in a complete review of systems (except as listed in HPI above).     Objective:   Physical Exam  BP 118/74 (BP Location: Left Arm, Patient Position: Sitting, Cuff Size: Normal)   Pulse 71   Temp (!) 96.8 F (36 C) (Temporal)   Wt 168 lb (76.2 kg)   SpO2 98%   BMI 36.35 kg/m   Wt Readings from Last 3 Encounters:  04/15/22 168 lb (76.2 kg)  03/25/22 165 lb (74.8 kg)  03/11/22 171 lb 1.6 oz (77.6 kg)    General: Appears their stated age, obese, in NAD. Skin: Warm, dry and intact.  HEENT: Head: normal shape and size; Eyes: sclera white, no icterus, conjunctiva pink, PERRLA and EOMs intact;  Neck:  Neck supple, trachea midline. No masses, lumps or thyromegaly  present.  Cardiovascular: Normal rate and rhythm. S1,S2 noted.  No murmur, rubs or gallops noted. No JVD or BLE edema. No carotid bruits noted. Pulmonary/Chest: Normal effort and positive vesicular breath sounds. No respiratory distress. No wheezes, rales or ronchi noted.  Abdomen: Soft and nontender. Normal bowel sounds.  Musculoskeletal: Gait slow and steady with use of rolling walker. Neurological: Alert and oriented. Coordination normal.  Psychiatric: Mood and affect normal. Behavior is normal. Judgment and thought content normal.    BMET    Component Value Date/Time   NA 140 04/15/2022 0836   K 3.3 (L) 04/15/2022 0836   CL 103 04/15/2022 0836   CO2 29 04/15/2022 0836   GLUCOSE 126 (H) 04/15/2022 0836   BUN 15 04/15/2022 0836   CREATININE 0.75 04/15/2022 0836   CREATININE 0.87 08/13/2020 1129   CALCIUM 9.1 04/15/2022 0836   GFRNONAA >60 04/15/2022 0836   GFRNONAA 65 08/13/2020 1129   GFRAA 76 08/13/2020 1129    Lipid Panel     Component Value Date/Time   CHOL 231 (H) 10/28/2021 1042   TRIG 62 10/28/2021 1042   HDL 64 10/28/2021 1042   CHOLHDL 3.6 10/28/2021 1042   VLDL 18.4 02/08/2013 1638   LDLCALC 152 (H) 10/28/2021 1042    CBC    Component Value Date/Time   WBC 5.1 04/15/2022 0836   RBC 3.08 (L) 04/15/2022 0836   HGB 10.1 (L) 04/15/2022 0836   HCT 32.0 (L) 04/15/2022 0836   PLT 237 04/15/2022 0836   MCV 103.9 (H) 04/15/2022 0836   MCH 32.8 04/15/2022 0836   MCHC 31.6 04/15/2022 0836   RDW 16.4 (H) 04/15/2022 0836   LYMPHSABS 1.4 04/15/2022 0836   MONOABS 0.6 04/15/2022 0836   EOSABS 0.1 04/15/2022 0836   BASOSABS 0.1 04/15/2022 0836    Hgb A1C Lab Results  Component Value Date   HGBA1C 5.6 10/28/2021  Assessment & Plan:   Preventative Health Maintenance:  Encouraged her to get a flu shot in the fall She declines tetanus for financial reasons, advised if she gets bit or cut to get this done Prevnar 20 today COVID-vaccine  UTD Discussed Shingrix vaccine, she will check coverage with her insurance company and get this done at the pharmacy if she would like She no longer needs Pap smears Mammogram and bone density ordered-she will call to schedule Colon screening UTD Encouraged her to consume a balanced diet and exercise regimen Advised her to see an eye doctor and dentist annually We will check lipid and A1c today  RTC in 6 months, follow-up chronic conditions Webb Silversmith, NP

## 2022-04-28 NOTE — Assessment & Plan Note (Signed)
Encouraged diet and exercise for weight loss ?

## 2022-04-28 NOTE — Addendum Note (Signed)
Addended by: Ashley Royalty E on: 04/28/2022 11:32 AM   Modules accepted: Orders

## 2022-04-29 ENCOUNTER — Encounter: Payer: Self-pay | Admitting: Oncology

## 2022-04-29 ENCOUNTER — Inpatient Hospital Stay (HOSPITAL_BASED_OUTPATIENT_CLINIC_OR_DEPARTMENT_OTHER): Payer: Medicare HMO | Admitting: Oncology

## 2022-04-29 ENCOUNTER — Inpatient Hospital Stay: Payer: Medicare HMO

## 2022-04-29 DIAGNOSIS — C189 Malignant neoplasm of colon, unspecified: Secondary | ICD-10-CM | POA: Diagnosis not present

## 2022-04-29 DIAGNOSIS — T451X5A Adverse effect of antineoplastic and immunosuppressive drugs, initial encounter: Secondary | ICD-10-CM

## 2022-04-29 DIAGNOSIS — D701 Agranulocytosis secondary to cancer chemotherapy: Secondary | ICD-10-CM

## 2022-04-29 DIAGNOSIS — C787 Secondary malignant neoplasm of liver and intrahepatic bile duct: Secondary | ICD-10-CM | POA: Diagnosis not present

## 2022-04-29 DIAGNOSIS — Z79899 Other long term (current) drug therapy: Secondary | ICD-10-CM | POA: Diagnosis not present

## 2022-04-29 DIAGNOSIS — C180 Malignant neoplasm of cecum: Secondary | ICD-10-CM | POA: Diagnosis not present

## 2022-04-29 DIAGNOSIS — Z5189 Encounter for other specified aftercare: Secondary | ICD-10-CM | POA: Diagnosis not present

## 2022-04-29 DIAGNOSIS — Z5111 Encounter for antineoplastic chemotherapy: Secondary | ICD-10-CM | POA: Diagnosis not present

## 2022-04-29 DIAGNOSIS — G629 Polyneuropathy, unspecified: Secondary | ICD-10-CM

## 2022-04-29 DIAGNOSIS — Z5112 Encounter for antineoplastic immunotherapy: Secondary | ICD-10-CM | POA: Diagnosis not present

## 2022-04-29 LAB — CBC WITH DIFFERENTIAL/PLATELET
Abs Immature Granulocytes: 0.01 10*3/uL (ref 0.00–0.07)
Basophils Absolute: 0 10*3/uL (ref 0.0–0.1)
Basophils Relative: 1 %
Eosinophils Absolute: 0.1 10*3/uL (ref 0.0–0.5)
Eosinophils Relative: 3 %
HCT: 31.5 % — ABNORMAL LOW (ref 36.0–46.0)
Hemoglobin: 10 g/dL — ABNORMAL LOW (ref 12.0–15.0)
Immature Granulocytes: 0 %
Lymphocytes Relative: 39 %
Lymphs Abs: 1.4 10*3/uL (ref 0.7–4.0)
MCH: 32.5 pg (ref 26.0–34.0)
MCHC: 31.7 g/dL (ref 30.0–36.0)
MCV: 102.3 fL — ABNORMAL HIGH (ref 80.0–100.0)
Monocytes Absolute: 0.6 10*3/uL (ref 0.1–1.0)
Monocytes Relative: 18 %
Neutro Abs: 1.4 10*3/uL — ABNORMAL LOW (ref 1.7–7.7)
Neutrophils Relative %: 39 %
Platelets: 167 10*3/uL (ref 150–400)
RBC: 3.08 MIL/uL — ABNORMAL LOW (ref 3.87–5.11)
RDW: 15.4 % (ref 11.5–15.5)
WBC: 3.5 10*3/uL — ABNORMAL LOW (ref 4.0–10.5)
nRBC: 0 % (ref 0.0–0.2)

## 2022-04-29 LAB — PROTEIN, URINE, RANDOM: Total Protein, Urine: <6 mg/dL

## 2022-04-29 LAB — COMPREHENSIVE METABOLIC PANEL
ALT: 18 U/L (ref 0–44)
AST: 23 U/L (ref 15–41)
Albumin: 3.4 g/dL — ABNORMAL LOW (ref 3.5–5.0)
Alkaline Phosphatase: 68 U/L (ref 38–126)
Anion gap: 8 (ref 5–15)
BUN: 15 mg/dL (ref 8–23)
CO2: 29 mmol/L (ref 22–32)
Calcium: 9.4 mg/dL (ref 8.9–10.3)
Chloride: 103 mmol/L (ref 98–111)
Creatinine, Ser: 0.81 mg/dL (ref 0.44–1.00)
GFR, Estimated: >60 mL/min (ref >60)
Glucose, Bld: 91 mg/dL (ref 70–99)
Potassium: 3.8 mmol/L (ref 3.5–5.1)
Sodium: 140 mmol/L (ref 135–145)
Total Bilirubin: 0.8 mg/dL (ref 0.3–1.2)
Total Protein: 7.2 g/dL (ref 6.5–8.1)

## 2022-04-29 MED ORDER — DEXTROSE 5 % IV SOLN
INTRAVENOUS | Status: DC
  Filled 2022-04-29 (×2): qty 250

## 2022-04-29 MED ORDER — OXALIPLATIN CHEMO INJECTION 100 MG/20ML
65.0000 mg/m2 | Freq: Once | INTRAVENOUS | Status: AC
  Administered 2022-04-29: 120 mg via INTRAVENOUS
  Filled 2022-04-29: qty 20

## 2022-04-29 MED ORDER — SODIUM CHLORIDE 0.9 % IV SOLN
Freq: Once | INTRAVENOUS | Status: AC
  Filled 2022-04-29: qty 250

## 2022-04-29 MED ORDER — LEUCOVORIN CALCIUM INJECTION 350 MG
700.0000 mg | Freq: Once | INTRAVENOUS | Status: AC
  Administered 2022-04-29: 700 mg via INTRAVENOUS
  Filled 2022-04-29: qty 35

## 2022-04-29 MED ORDER — PROCHLORPERAZINE MALEATE 10 MG PO TABS
10.0000 mg | ORAL_TABLET | Freq: Once | ORAL | Status: AC
  Administered 2022-04-29: 10 mg via ORAL
  Filled 2022-04-29: qty 1

## 2022-04-29 MED ORDER — SODIUM CHLORIDE 0.9 % IV SOLN
2400.0000 mg/m2 | INTRAVENOUS | Status: DC
  Administered 2022-04-29: 4300 mg via INTRAVENOUS
  Filled 2022-04-29: qty 86

## 2022-04-29 MED ORDER — SODIUM CHLORIDE 0.9 % IV SOLN
5.0000 mg/kg | Freq: Once | INTRAVENOUS | Status: AC
  Administered 2022-04-29: 400 mg via INTRAVENOUS
  Filled 2022-04-29: qty 16

## 2022-04-29 MED ORDER — PALONOSETRON HCL INJECTION 0.25 MG/5ML
0.2500 mg | Freq: Once | INTRAVENOUS | Status: AC
  Administered 2022-04-29: 0.25 mg via INTRAVENOUS
  Filled 2022-04-29: qty 5

## 2022-04-29 NOTE — Assessment & Plan Note (Signed)
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  No sufficient tissue for NGS Liquid biopsy showed KRAS12V, APC, TP53 mutation, TMB 4.  S/p 6 cycles of 5-FU /Bevacizumab--> CT showed mixed response--> FOLFOX/bevacizumab.  Labs reviewed and discussed with patient hold chemotherapy FOLFOX bevacizumab today-see below. CT imaging shows stable disease.  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  Stable disease. 

## 2022-04-29 NOTE — Assessment & Plan Note (Signed)
patient gets prophylactic G-CSF with Udenyca on day 3.  Claritin 10 mg daily for 4 days.   

## 2022-04-29 NOTE — Progress Notes (Signed)
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  No sufficient tissue for NGS Liquid biopsy showed KRAS12V, APC, TP53 mutation, TMB 4.  S/p 6 cycles of 5-FU /Bevacizumab--> CT showed mixed response--> FOLFOX/bevacizumab.  Labs reviewed and discussed with patient hold chemotherapy FOLFOX bevacizumab today-see below. CT imaging shows stable disease.  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  Stable disease.  Chemotherapy induced neutropenia (HCC) patient gets prophylactic G-CSF with Udenyca on day 3.  Claritin 10 mg daily for 4 days.    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 Continue gabapentin 300 mg twice daily.  I spent time and answered all patient's questions regarding why resuming chemotherapy is recommended.  All questions were answered to her satisfaction.  She agrees with proceeding with treatment today.   We spent sufficient time to discuss many aspect of care, questions were answered to patient's satisfaction. A total of 40 minutes was spent on this visit.  With 5 minutes spent reviewing image findings, pathology reports, 25 minutes counseling the patient on the diagnosis, goal of care, chemotherapy treatments, side effects of the treatment, management of symptoms.  Additional 10 minutes was spent on answering patient's questions.  No orders of the defined types were placed in this encounter.  Follow-up in 2 weeks lab [name]. All questions were answered. The patient knows to call the clinic with any  problems, questions or concerns.  [name], MD, PhD [facility] Hematology Oncology 04/29/2022   CHIEF COMPLAINTS/REASON FOR VISIT:  metastatic colon cancer  HISTORY OF PRESENTING ILLNESS:   Chelsea [name] is a  76 y.o.  female with PMH listed below was seen in consultation at the request of  Jearld Fenton, NP  for evaluation of metastatic colon cancer.  Patient initially went to emergency room on 03/24/2021 for abdominal pain. 03/24/2021, CT scan of the abdomen showed marked bladder wall thickening with surrounding soft tissue stranding is noted.  Concerning for cystitis.  There is a lobulated mass between the posterior wall of the bladder and the rectum which appears to arise from the vaginal cuff.  This is indeterminate and difficult to characterize reflecting lack of IV contrast material.  Nonobstructing left renal calculus, left sacroiliitis, lumbar spondylosis aortic atherosclerosis. 03/24/2021 CT pelvic showed abdominal [place] soft tissue of right cecum, concerning for colon carcinoma.  Colonoscopy recommended.  Low-attenuation mass in the region of vaginal cuff is again noted.  Diffuse urinary bladder wall thickening and mild bilateral hydroureter possibly cystitis.  [place] 03/24/2021 Lobulated solid 3.8 cm mass confirmed by ultrasound. This is most compatible with a neoplasm but remains indeterminate as to the origin as the epicenter seems to be outside of both the vaginal cuff and the adjacent colon, while the lesion appears inseparable from both. Extensive echogenic debris within the distended urinary bladder.   03/26/2021, patient was seen by Dr. Theora Gianotti for evaluation of colonic/vaginal cuff pelvic mass.  Patient also was referred to gastroenterology for colonoscopy.  03/26/2021 CEA 3.4.  CA125 9.7 04/04/2021, status post colonoscopy which showed a frond like /villous nonobstructing large mass was found in the cecum,  this was biopsied.  Terminal ileum was briefly intubated and appeared  normal.  Diverticulosis in the sigmoid colon.  The rectum, sigmoid colon, descending colon, transverse colon and ascending colon were normal.  Nonbleeding internal hemorrhoids. 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    Given that the biopsy may not be representative of the entire underlying lesion.  Patient was recommended to establish with surgeon for evaluation. Patient no showed for her surgery appointment and as well as her follow-up appointment with gastroenterology.  Per daughter, patient was having cervical spine surgery and she prioritized that over the colon surgery.  08/19/2021, patient establish care with surgery Dr. Christian Mate 08/21/2021, CT abdomen pelvis with contrast  showed soft tissue mass at the base of cecum measuring up to 5 cm, slightly increased in size.  Interval development of multiple low-density lesions throughout the liver highly suggestive for metastatic disease.  Complex mass in the region of the vaginal cuff measuring up to 5.2 cm, increased in size.  Right ovary also appears more prominent on the current examination.  Moderate large volume of stool throughout the colon.  Mild urinary bladder wall thickening.  Colonic diverticulosis without evidence of active diverticulitis.  Aortic atherosclerosis  09/04/2021, patient is status post left lobe liver lesion biopsy and pathology showed metastatic adenocarcinoma, compatible with colorectal primary.  She was referred to establish care with oncology. She was accompanied by her daughter today.  Patient walks with a walker.+ Night sweats + weight loss.  Denies any abdominal pain currently, melena, blood in the stool.  She is currently being followed by home health PT/OT following a previous spinal surgery  10/10/2020, status post Mediport placement. 10/20/2020, cycle one 5-FU/bevacizumab.  Foundation one liquid biopsy testing showed KRASG12V, APC TP53 TMB 4  12/22/2021 CT chest abdomen  pelvis Decreased cecum mass, however increased size and size of metastatic lesions throughout the liver.  Interval enlargement of vaginal cuff mass. No mets in chest.   # During the interval, patient was seen by gynecology oncology Dr. Fransisca Connors.  Biopsy of the vaginal mass was not recommended. Patient's case was also discussed on multidisciplinary tumor board.  IR potentially can try a biopsy if patient is willing to. I had a discussion with patient's daughter over the phone prior to this visit.  We discussed about option of adding oxaliplatin to 5-FU/bevacizumab and continue treatment of colon cancer, repeat CT scan short-term and if progression, repeat biopsy versus proceeding with vaginal mass biopsy.  Daughter prefers proceeding with chemotherapy and defer biopsy.   01/07/22, FOLFOX/bev  01/08/2022 patient has had a second opinion at [facility] and was seen by Dr. Reynaldo Minium .  Dr. Reynaldo Minium agrees with the current treatment plan with FOLFOX/bevacizumab.  He has ordered guardant 360 to see if she may be eligible for clinical trial in the future.  04/13/2022, CT chest abdomen pelvis showed a cecal mass with stable hepatic metastasis.  Mixed cystic and solid mass in the region of the vaginal cuff, new area of patchy groundglass and consolidation in the right upper lobe, lingula and left lower lobe.  Likely due to pneumonia.  Hepatic steatosis.  Bilateral renal stones.  Aortic atherosclerosis   INTERVAL HISTORY Chelsea [name] is a 76 y.o. female who has above history reviewed by me today presents for follow up visit for management of metastatic colon cancer Patient was accompanied by her son-in-law. Patient feels weak today.  Patient finished 10 days of Levaquin.  No cough, chest pain.  She feels well  while off chemotherapy.  She has multiple questions about resuming chemotherapy.    Review of Systems  Constitutional:  Positive for fatigue. Negative for appetite change, chills, fever and unexpected  weight change.  HENT:   Negative for hearing loss and voice change.   Eyes:  Negative for eye problems.  Respiratory:  Negative for chest tightness and cough.   Cardiovascular:  Negative for chest pain.  Gastrointestinal:  Negative for abdominal distention, abdominal pain, blood in stool and diarrhea.  Endocrine: Negative for hot flashes.  Genitourinary:  Negative for difficulty urinating and frequency.   Musculoskeletal:  Positive for back pain. Negative for arthralgias.  Skin:  Negative for itching and rash.  Neurological:  Positive for numbness (chornic finger tips). Negative for extremity weakness.  Hematological:  Negative for adenopathy.  Psychiatric/Behavioral:  Negative for confusion.     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)    Moderate mitral insufficiency    Port-A-Cath in place    Seizures [facility])    Spinal stenosis    Ulcer    Urinary incontinence     SURGICAL HISTORY: Past Surgical History:  Procedure Laterality Date   ABDOMINAL HYSTERECTOMY     BACK SURGERY     due to polio   BREAST BIOPSY Bilateral    neg   BREAST BIOPSY Right 2011   neg/stereo   CARPAL TUNNEL RELEASE     CATARACT EXTRACTION Right 2020   COLONOSCOPY WITH PROPOFOL N/A 04/04/2021   Procedure: COLONOSCOPY WITH PROPOFOL;  Surgeon: Virgel Manifold, MD;  Location: [facility] ENDOSCOPY;  Service: Endoscopy;  Laterality: N/A;   EYE SURGERY     IR IMAGING GUIDED PORT INSERTION  10/09/2021    SOCIAL HISTORY: Social History   Socioeconomic History   Marital status: Divorced    Spouse name: Not on file   Number of children: Not on file   Years of education: Not on file   Highest education level: Not on file  Occupational History   Not on file  Tobacco Use   Smoking status: Never   Smokeless tobacco: Never  Vaping Use   Vaping Use: Never used   Substance and Sexual Activity   Alcohol use: Never   Drug use: No   Sexual activity: Not Currently  Other Topics Concern   Not on file  Social History Narrative   Not on file   Social Determinants of Health   Financial Resource Strain: Low Risk  (01/12/2022)   Overall Financial Resource Strain (CARDIA)    Difficulty of Paying Living Expenses: Not very hard  Food Insecurity: No Food Insecurity (01/12/2022)   Hunger Vital Sign    Worried About Running Out of Food in the Last Year: Never true    Ran Out of Food in the Last Year: Never true  Transportation Needs: No Transportation Needs (01/12/2022)   PRAPARE - Hydrologist (Medical): No    Lack of Transportation (Non-Medical): No  Physical Activity: Inactive (01/12/2022)   Exercise Vital Sign    Days of Exercise per Week: 0 days    Minutes of Exercise per Session: 0 min  Stress: Stress Concern Present (01/12/2022)   [place]    Feeling of Stress : To some  extent  Social Connections: Moderately Integrated (01/12/2022)   Social Connection and Isolation Panel [NHANES]    Frequency of Communication with Friends and Family: Three times a week    Frequency of Social Gatherings with Friends and Family: Three times a week    Attends Religious Services: 1 to 4 times per year    Active Member of Clubs or Organizations: No    Attends Archivist Meetings: 1 to 4 times per year    Marital Status: Widowed  Intimate Partner Violence: Not At Risk (01/12/2022)   Humiliation, Afraid, Rape, and Kick questionnaire    Fear of Current or Ex-Partner: No    Emotionally Abused: No    Physically Abused: No    Sexually Abused: No    FAMILY HISTORY: Family History  Problem Relation Age of Onset   Arthritis Mother    Heart disease Mother    Stroke Mother    Hypertension Mother    COPD Mother    Sudden death Sister    Other Father        unknown  medical history   Breast cancer Neg Hx     ALLERGIES:  is allergic to penicillins.  MEDICATIONS:  Current Outpatient Medications  Medication Sig Dispense Refill   acetaminophen (TYLENOL) 500 MG tablet Take 500 mg by mouth every 6 (six) hours as needed.     ALPRAZolam (XANAX) 0.5 MG tablet TAKE 1 TABLET BY MOUTH TWICE A DAY AS NEEDED FOR ANXIETY 60 tablet 0   aspirin 325 MG tablet Take 325 mg by mouth daily.     atorvastatin (LIPITOR) 10 MG tablet TAKE 1 TABLET BY MOUTH EVERY DAY 90 tablet 0   busPIRone (BUSPAR) 5 MG tablet Take 1 tablet (5 mg total) by mouth 3 (three) times daily. 270 tablet 1   Cyanocobalamin (VITAMIN B 12 PO) Take 500 mcg by mouth daily.     diclofenac Sodium (VOLTAREN) 1 % GEL Apply 4 g topically 4 (four) times daily. 4 g 0   docusate sodium (COLACE) 100 MG capsule Take 1 capsule (100 mg total) by mouth 2 (two) times daily. 60 capsule 0   FLUoxetine (PROZAC) 20 MG capsule TAKE 3 CAPSULES (60 MG TOTAL) BY MOUTH EVERY MORNING. 270 capsule 0   gabapentin (NEURONTIN) 300 MG capsule Take 1 capsule (300 mg total) by mouth 2 (two) times daily. 180 capsule 1   lidocaine (LIDODERM) 5 % Place 1 patch onto the skin daily. Remove & Discard patch within 12 hours or as directed by MD 1 patch 0   lidocaine-prilocaine (EMLA) cream Apply small amount of cream to port site 1-2 hour prior to chemo treatment. 30 g 3   loperamide (IMODIUM) 2 MG capsule TAKE 1 CAP BY MOUTH SEE ADMIN INSTRUCTIONS. INITIAL: 4 MG, FOLLOWED BY 2 MG AFTER EACH LOOSE STOOL MAXIMUM: 16 MG/DAY 60 capsule 0   loratadine (CLARITIN) 10 MG tablet Take 1 tablet (10 mg total) by mouth See admin instructions. Take 1 tablet daily for 4 days after each chemotherapy treatments. 30 tablet 2   meclizine (ANTIVERT) 12.5 MG tablet Take 1 tablet (12.5 mg total) by mouth 3 (three) times daily as needed for dizziness. 30 tablet 0   methocarbamol (ROBAXIN) 500 MG tablet TAKE 1 TABLET TWICE DAILY AS NEEDED FOR MUSCLE SPASM(S) 60 tablet 0    ondansetron (ZOFRAN) 8 MG tablet Take 1 tablet (8 mg total) by mouth 2 (two) times daily as needed (Nausea or vomiting). 30 tablet 1  polyethylene glycol powder (GLYCOLAX/MIRALAX) 17 GM/SCOOP powder Take 17 g by mouth daily. 3350 g 1   potassium chloride SA (KLOR-CON M20) 20 MEQ tablet Take 1 tablet (20 mEq total) by mouth daily. 30 tablet 1   prochlorperazine (COMPAZINE) 10 MG tablet Take 1 tablet (10 mg total) by mouth every 6 (six) hours as needed (Nausea or vomiting). 30 tablet 1   triamterene-hydrochlorothiazide (MAXZIDE-25) 37.5-25 MG tablet TAKE 1 TABLET BY MOUTH EVERY DAY 90 tablet 0   vitamin E 180 MG (400 UNITS) capsule Take 400 Units by mouth daily.     No current facility-administered medications for this visit.     PHYSICAL EXAMINATION: ECOG PERFORMANCE STATUS: 2 - Symptomatic, <50% confined to bed Vitals:   04/29/22 0842  BP: 119/70  Pulse: (!) 102  Temp: (!) 96.7 F (35.9 C)    Filed Weights   04/29/22 0842  Weight: 168 lb (76.2 kg)      Physical Exam Constitutional:      General: She is not in acute distress.    Comments: Patient sits in wheelchair  HENT:     Head: Normocephalic and atraumatic.  Eyes:     General: No scleral icterus. Cardiovascular:     Rate and Rhythm: Normal rate and regular rhythm.     Heart sounds: Normal heart sounds.  Pulmonary:     Effort: Pulmonary effort is normal. No respiratory distress.     Breath sounds: No wheezing.  Abdominal:     General: Bowel sounds are normal. There is no distension.     Palpations: Abdomen is soft.  Musculoskeletal:        General: No deformity. Normal range of motion.     Cervical back: Normal range of motion and neck supple.  Skin:    General: Skin is warm and dry.     Findings: No erythema or rash.  Neurological:     Mental Status: She is alert and oriented to person, place, and time. Mental status is at baseline.     Cranial Nerves: No cranial nerve deficit.     Coordination:  Coordination normal.  Psychiatric:        Mood and Affect: Mood normal.     LABORATORY DATA:  I have reviewed the data as listed Lab Results  Component Value Date   WBC 3.5 (L) 04/29/2022   HGB 10.0 (L) 04/29/2022   HCT 31.5 (L) 04/29/2022   MCV 102.3 (H) 04/29/2022   PLT 167 04/29/2022   Recent Labs    03/25/22 0809 04/15/22 0836 04/29/22 0829  NA 139 140 140  K 3.4* 3.3* 3.8  CL 102 103 103  CO2 _0 GLUCOSE 126* 126* 91  BUN _1 CREATININE 1.18* 0.75 0.81  CALCIUM 9.2 9.1 9.4  GFRNONAA 48* >60 >60  PROT 7.4 7.0 7.2  ALBUMIN 3.5 3.4* 3.4*  AST _2 ALT _3 ALKPHOS 128* 86 68  BILITOT 0.5 0.6 0.8    Iron/TIBC/Ferritin/ %Sat    Component Value Date/Time   FERRITIN 58 10/24/2020 0402       RADIOGRAPHIC STUDIES: I have personally reviewed the radiological images as listed and agreed with the findings in the report. CT CHEST ABDOMEN PELVIS W CONTRAST  Result Date: 04/13/2022 CLINICAL DATA:  Colon mass, liver cancer diagnosed December 2022. * Tracking Code: BO * EXAM: CT CHEST, ABDOMEN, AND PELVIS WITH CONTRAST TECHNIQUE: Multidetector CT imaging of the chest, abdomen and pelvis was  performed following the standard protocol during bolus administration of intravenous contrast. RADIATION DOSE REDUCTION: This exam was performed according to the departmental dose-optimization program which includes automated exposure control, adjustment of the mA and/or kV according to patient size and/or use of iterative reconstruction technique. CONTRAST:  169m OMNIPAQUE IOHEXOL 300 MG/ML  SOLN COMPARISON:  12/22/2021. FINDINGS: CT CHEST FINDINGS Cardiovascular: Left IJ Port-A-Cath terminates in the right atrium. Atherosclerotic calcification of the aorta and aortic valve. Heart is enlarged. No pericardial effusion. Mediastinum/Nodes: No pathologically enlarged mediastinal, hilar or axillary lymph nodes. Calcified mediastinal and hilar lymph nodes. Esophagus is  unremarkable. Lungs/Pleura: Calcified granulomas. Calcified pleural plaques in the right hemithorax. New areas of ground-glass and consolidation in the right upper lobe, lingula and left lower lobe. No pleural fluid. Airway is unremarkable. Musculoskeletal: T8 and T9 vertebral body fusion with a kyphotic deformity. Degenerative changes in the spine. No worrisome lytic or sclerotic lesions. CT ABDOMEN PELVIS FINDINGS Hepatobiliary: Liver is decreased in attenuation diffusely. Mildly hypoattenuating lesions in the liver measure up to 2.3 cm in the left hepatic lobe, unchanged. Gallbladder is mildly distended. No biliary ductal dilatation. Pancreas: Negative. Spleen: Negative. Adrenals/Urinary Tract: Adrenal glands are unremarkable. Scarring in the kidneys bilaterally. Bilateral renal stones. Ureters are decompressed. Left posterolateral bladder diverticulum. Bladder is otherwise grossly unremarkable. Stomach/Bowel: Stomach, small bowel and appendix are unremarkable. Eccentric irregular wall thickening in the cecum (2/68), similar. Stool is seen in the majority of the colon, indicative of constipation. Vascular/Lymphatic: Atherosclerotic calcification of the aorta. No pathologically enlarged lymph nodes. Gastrohepatic ligament lymph nodes are subcentimeter in short axis size. Reproductive: Hysterectomy. Heterogeneous mixed cystic and solid mass in the region of the vaginal cuff measures 3.8 x 5.6 cm, stable from 12/22/2021. Other: No free fluid.  Mesenteries and peritoneum are unremarkable. Musculoskeletal: Degenerative changes in the spine. No worrisome lytic or sclerotic lesions. IMPRESSION: 1. 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 2. Mixed cystic and solid mass in the region of the vaginal cuff, worrisome for malignancy. 3. New areas of patchy ground-glass and consolidation in the right upper lobe, lingula and left lower lobe, likely due to pneumonia. Treatment later organizing pneumonia not excluded.  4. Hepatic steatosis. 5. Bilateral renal stones. 6.  Aortic atherosclerosis (ICD10-I70.0). Electronically Signed   By: MLorin PicketM.D.   On: 04/13/2022 16:26   DG Lumbar Spine Complete  Result Date: 02/20/2022 CLINICAL DATA:  Low back pain EXAM: LUMBAR SPINE - COMPLETE 4+ VIEW COMPARISON:  None Available. FINDINGS: Lumbar vertebral body height are preserved without evidence of fracture. Grade 1 anterolisthesis of L4 on L5. Moderate intervertebral disc space narrowing at L5-S1. Facet arthropathy throughout the lumbar spine. No spondylolysis identified. IMPRESSION: Degenerative changes of the lumbar spine with no acute fracture identified. Electronically Signed   By: DOfilia NeasM.D.   On: 02/20/2022 15:46   DG Shoulder Right  Result Date: 02/20/2022 CLINICAL DATA:  Right shoulder pain EXAM: RIGHT SHOULDER - 2+ VIEW COMPARISON:  Right shoulder x-ray 07/08/2019 FINDINGS: No acute fracture or dislocation identified. Moderate to severe narrowing of the glenohumeral joint with large inferior marginal osteophytes. Mild narrowing of the acromioclavicular joint. Small likely subcortical cysts at the greater tuberosity of the humerus. IMPRESSION: Degenerative changes of the shoulder as described. Electronically Signed   By: DOfilia NeasM.D.   On: 02/20/2022 15:45

## 2022-04-29 NOTE — Assessment & Plan Note (Signed)
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 Continue gabapentin 300 mg twice daily.

## 2022-04-29 NOTE — Patient Instructions (Addendum)
[facility]  Discharge Instructions: Thank you for choosing [facility] to provide your oncology and hematology care.  If you have a lab appointment with the Cancer Center, please go directly to the Cancer Center and check in at the registration area.  Wear comfortable clothing and clothing appropriate for easy access to any Portacath or PICC line.   We strive to give you quality time with your provider. You may need to reschedule your appointment if you arrive late (15 or more minutes).  Arriving late affects you and other patients whose appointments are after yours.  Also, if you miss three or more appointments without notifying the office, you may be dismissed from the clinic at the provider's discretion.      For prescription refill requests, have your pharmacy contact our office and allow 72 hours for refills to be completed.       To help prevent nausea and vomiting after your treatment, we encourage you to take your nausea medication as directed.  BELOW ARE SYMPTOMS THAT SHOULD BE REPORTED IMMEDIATELY: *FEVER GREATER THAN 100.4 F (38 C) OR HIGHER *CHILLS OR SWEATING *NAUSEA AND VOMITING THAT IS NOT CONTROLLED WITH YOUR NAUSEA MEDICATION *UNUSUAL SHORTNESS OF BREATH *UNUSUAL BRUISING OR BLEEDING *URINARY PROBLEMS (pain or burning when urinating, or frequent urination) *BOWEL PROBLEMS (unusual diarrhea, constipation, pain near the anus) TENDERNESS IN MOUTH AND THROAT WITH OR WITHOUT PRESENCE OF ULCERS (sore throat, sores in mouth, or a toothache) UNUSUAL RASH, SWELLING OR PAIN  UNUSUAL VAGINAL DISCHARGE OR ITCHING   Items with * indicate a potential emergency and should be followed up as soon as possible or go to the Emergency Department if any problems should occur.  Please show the CHEMOTHERAPY ALERT CARD or IMMUNOTHERAPY ALERT CARD at check-in to the Emergency Department and triage nurse.  Should you have questions after your  visit or need to cancel or reschedule your appointment, please contact [facility]  [phone] and follow the prompts.  Office hours are 8:00 a.m. to 4:30 p.m. Monday - Friday. Please note that voicemails left after 4:00 p.m. may not be returned until the following business day.  We are closed weekends and major holidays. You have access to a nurse at all times for urgent questions. Please call the main number to the clinic [phone] and follow the prompts.  For any non-urgent questions, you may also contact your provider using MyChart. We now offer e-Visits for anyone 18 and older to request care online for non-urgent symptoms. For details visit [email or web address].   Also download the MyChart app! Go to the app store, search "MyChart", open the app, select [facility], and log in with your MyChart username and password.  Masks are optional in the cancer centers. If you would like for your care team to wear a mask while they are taking care of you, please let them know. For doctor visits, patients may have with them one support person who is at least 77 years old. At this time, visitors are not allowed in the infusion area.   

## 2022-04-30 LAB — CEA: CEA: 2.8 ng/mL (ref 0.0–4.7)

## 2022-05-01 ENCOUNTER — Inpatient Hospital Stay: Payer: Medicare HMO

## 2022-05-01 DIAGNOSIS — Z5111 Encounter for antineoplastic chemotherapy: Secondary | ICD-10-CM | POA: Diagnosis not present

## 2022-05-01 DIAGNOSIS — Z5112 Encounter for antineoplastic immunotherapy: Secondary | ICD-10-CM | POA: Diagnosis not present

## 2022-05-01 DIAGNOSIS — C180 Malignant neoplasm of cecum: Secondary | ICD-10-CM | POA: Diagnosis not present

## 2022-05-01 DIAGNOSIS — Z5189 Encounter for other specified aftercare: Secondary | ICD-10-CM | POA: Diagnosis not present

## 2022-05-01 DIAGNOSIS — C189 Malignant neoplasm of colon, unspecified: Secondary | ICD-10-CM

## 2022-05-01 DIAGNOSIS — C787 Secondary malignant neoplasm of liver and intrahepatic bile duct: Secondary | ICD-10-CM | POA: Diagnosis not present

## 2022-05-01 DIAGNOSIS — Z79899 Other long term (current) drug therapy: Secondary | ICD-10-CM | POA: Diagnosis not present

## 2022-05-01 LAB — DRUG MONITORING, PANEL 8 WITH CONFIRMATION, URINE
6 Acetylmorphine: NEGATIVE ng/mL (ref <10)
Alcohol Metabolites: NEGATIVE ng/mL (ref <500)
Alphahydroxyalprazolam: 198 ng/mL — ABNORMAL HIGH (ref <25)
Alphahydroxymidazolam: NEGATIVE ng/mL (ref <50)
Alphahydroxytriazolam: NEGATIVE ng/mL (ref <50)
Aminoclonazepam: NEGATIVE ng/mL (ref <25)
Amphetamines: NEGATIVE ng/mL (ref <500)
Benzodiazepines: POSITIVE ng/mL — AB (ref <100)
Buprenorphine, Urine: NEGATIVE ng/mL (ref <5)
Cocaine Metabolite: NEGATIVE ng/mL (ref <150)
Codeine: NEGATIVE ng/mL (ref <50)
Creatinine: 85.8 mg/dL (ref >=20.0)
Hydrocodone: NEGATIVE ng/mL (ref <50)
Hydromorphone: NEGATIVE ng/mL (ref <50)
Hydroxyethylflurazepam: NEGATIVE ng/mL (ref <50)
Lorazepam: NEGATIVE ng/mL (ref <50)
MDMA: NEGATIVE ng/mL (ref <500)
Marijuana Metabolite: NEGATIVE ng/mL (ref <20)
Morphine: NEGATIVE ng/mL (ref <50)
Nordiazepam: NEGATIVE ng/mL (ref <50)
Norhydrocodone: NEGATIVE ng/mL (ref <50)
Opiates: NEGATIVE ng/mL (ref <100)
Oxazepam: NEGATIVE ng/mL (ref <50)
Oxidant: NEGATIVE ug/mL (ref <200)
Oxycodone: NEGATIVE ng/mL (ref <100)
Temazepam: NEGATIVE ng/mL (ref <50)
pH: 7 (ref 4.5–9.0)

## 2022-05-01 LAB — LIPID PANEL
Cholesterol: 148 mg/dL (ref <200)
HDL: 55 mg/dL (ref >=50)
LDL Cholesterol (Calc): 79 mg/dL (calc)
Non-HDL Cholesterol (Calc): 93 mg/dL (calc) (ref <130)
Total CHOL/HDL Ratio: 2.7 (calc) (ref <5.0)
Triglycerides: 68 mg/dL (ref <150)

## 2022-05-01 LAB — DM TEMPLATE

## 2022-05-01 LAB — HEMOGLOBIN A1C
Hgb A1c MFr Bld: 5.6 % of total Hgb (ref <5.7)
Mean Plasma Glucose: 114 mg/dL
eAG (mmol/L): 6.3 mmol/L

## 2022-05-01 MED ORDER — SODIUM CHLORIDE 0.9% FLUSH
10.0000 mL | INTRAVENOUS | Status: DC | PRN
  Administered 2022-05-01: 10 mL
  Filled 2022-05-01: qty 10

## 2022-05-01 MED ORDER — PEGFILGRASTIM-CBQV 6 MG/0.6ML ~~LOC~~ SOSY
6.0000 mg | PREFILLED_SYRINGE | Freq: Once | SUBCUTANEOUS | Status: AC
  Administered 2022-05-01: 6 mg via SUBCUTANEOUS

## 2022-05-01 MED ORDER — HEPARIN SOD (PORK) LOCK FLUSH 100 UNIT/ML IV SOLN
500.0000 [IU] | Freq: Once | INTRAVENOUS | Status: AC | PRN
  Administered 2022-05-01: 500 [IU]
  Filled 2022-05-01: qty 5

## 2022-05-02 ENCOUNTER — Other Ambulatory Visit: Payer: Self-pay | Admitting: Oncology

## 2022-05-05 ENCOUNTER — Encounter: Payer: Self-pay | Admitting: Oncology

## 2022-05-09 ENCOUNTER — Other Ambulatory Visit: Payer: Self-pay | Admitting: Internal Medicine

## 2022-05-11 ENCOUNTER — Inpatient Hospital Stay: Payer: Medicare HMO | Admitting: Licensed Clinical Social Worker

## 2022-05-11 NOTE — Telephone Encounter (Signed)
Requested Prescriptions  Pending Prescriptions Disp Refills  . atorvastatin (LIPITOR) 10 MG tablet [Pharmacy Med Name: ATORVASTATIN 10 MG TABLET] 90 tablet 0    Sig: TAKE 1 TABLET BY MOUTH EVERY DAY     Cardiovascular:  Antilipid - Statins Failed - 05/09/2022  9:26 AM      Failed - Lipid Panel in normal range within the last 12 months    Cholesterol  Date Value Ref Range Status  04/28/2022 148 <200 mg/dL Final   LDL Cholesterol (Calc)  Date Value Ref Range Status  04/28/2022 79 mg/dL (calc) Final    Comment:    Reference range: <100 . Desirable range <100 mg/dL for primary prevention;   <70 mg/dL for patients with CHD or diabetic patients  with > or = 2 CHD risk factors. Marland Kitchen LDL-C is now calculated using the Martin-Hopkins  calculation, which is a validated novel method providing  better accuracy than the Friedewald equation in the  estimation of LDL-C.  Cresenciano Genre et al. Annamaria Helling. 5621;308(65): 2061-2068  (http://education.[employer].com/faq/FAQ164)    Direct LDL  Date Value Ref Range Status  02/08/2013 151.3 mg/dL Final    Comment:    Optimal:  <100 mg/dLNear or Above Optimal:  100-129 mg/dLBorderline High:  130-159 mg/dLHigh:  160-189 mg/dLVery High:  >190 mg/dL   HDL  Date Value Ref Range Status  04/28/2022 55 > OR = 50 mg/dL Final   Triglycerides  Date Value Ref Range Status  04/28/2022 68 <150 mg/dL Final         Passed - Patient is not pregnant      Passed - Valid encounter within last 12 months    Recent Outpatient Visits          1 week ago Aortic atherosclerosis [facility])   [facility] Jearld Fenton, NP   2 months ago Right leg weakness   [facility] [place], Coralie Keens, NP   6 months ago Pure hypercholesterolemia   [place], Coralie Keens, NP   8 months ago 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 [facility])   [facility], Coralie Keens, NP   9 months ago [facility]  [place], Coralie Keens, NP

## 2022-05-13 ENCOUNTER — Inpatient Hospital Stay (HOSPITAL_BASED_OUTPATIENT_CLINIC_OR_DEPARTMENT_OTHER): Payer: Medicare HMO | Admitting: Oncology

## 2022-05-13 ENCOUNTER — Inpatient Hospital Stay: Payer: Medicare HMO

## 2022-05-13 ENCOUNTER — Inpatient Hospital Stay: Payer: Medicare HMO | Attending: Oncology

## 2022-05-13 ENCOUNTER — Encounter: Payer: Self-pay | Admitting: Oncology

## 2022-05-13 VITALS — BP 102/62 | HR 74 | Temp 98.2°F | Wt 161.0 lb

## 2022-05-13 DIAGNOSIS — D701 Agranulocytosis secondary to cancer chemotherapy: Secondary | ICD-10-CM

## 2022-05-13 DIAGNOSIS — Z5189 Encounter for other specified aftercare: Secondary | ICD-10-CM | POA: Diagnosis not present

## 2022-05-13 DIAGNOSIS — Z79899 Other long term (current) drug therapy: Secondary | ICD-10-CM | POA: Diagnosis not present

## 2022-05-13 DIAGNOSIS — G629 Polyneuropathy, unspecified: Secondary | ICD-10-CM | POA: Diagnosis not present

## 2022-05-13 DIAGNOSIS — C189 Malignant neoplasm of colon, unspecified: Secondary | ICD-10-CM | POA: Insufficient documentation

## 2022-05-13 DIAGNOSIS — T451X5A Adverse effect of antineoplastic and immunosuppressive drugs, initial encounter: Secondary | ICD-10-CM

## 2022-05-13 DIAGNOSIS — C787 Secondary malignant neoplasm of liver and intrahepatic bile duct: Secondary | ICD-10-CM | POA: Diagnosis not present

## 2022-05-13 DIAGNOSIS — Z5112 Encounter for antineoplastic immunotherapy: Secondary | ICD-10-CM | POA: Insufficient documentation

## 2022-05-13 DIAGNOSIS — Z5111 Encounter for antineoplastic chemotherapy: Secondary | ICD-10-CM | POA: Insufficient documentation

## 2022-05-13 DIAGNOSIS — R634 Abnormal weight loss: Secondary | ICD-10-CM | POA: Diagnosis not present

## 2022-05-13 LAB — CBC WITH DIFFERENTIAL/PLATELET
Abs Immature Granulocytes: 0.01 10*3/uL (ref 0.00–0.07)
Basophils Absolute: 0 10*3/uL (ref 0.0–0.1)
Basophils Relative: 0 %
Eosinophils Absolute: 0.1 10*3/uL (ref 0.0–0.5)
Eosinophils Relative: 2 %
HCT: 36.1 % (ref 36.0–46.0)
Hemoglobin: 11.4 g/dL — ABNORMAL LOW (ref 12.0–15.0)
Immature Granulocytes: 0 %
Lymphocytes Relative: 30 %
Lymphs Abs: 1.5 10*3/uL (ref 0.7–4.0)
MCH: 32.2 pg (ref 26.0–34.0)
MCHC: 31.6 g/dL (ref 30.0–36.0)
MCV: 102 fL — ABNORMAL HIGH (ref 80.0–100.0)
Monocytes Absolute: 0.5 10*3/uL (ref 0.1–1.0)
Monocytes Relative: 10 %
Neutro Abs: 2.9 10*3/uL (ref 1.7–7.7)
Neutrophils Relative %: 58 %
Platelets: 126 10*3/uL — ABNORMAL LOW (ref 150–400)
RBC: 3.54 MIL/uL — ABNORMAL LOW (ref 3.87–5.11)
RDW: 15.2 % (ref 11.5–15.5)
WBC: 5 10*3/uL (ref 4.0–10.5)
nRBC: 0 % (ref 0.0–0.2)

## 2022-05-13 LAB — COMPREHENSIVE METABOLIC PANEL
ALT: 16 U/L (ref 0–44)
AST: 25 U/L (ref 15–41)
Albumin: 3.5 g/dL (ref 3.5–5.0)
Alkaline Phosphatase: 90 U/L (ref 38–126)
Anion gap: 7 (ref 5–15)
BUN: 12 mg/dL (ref 8–23)
CO2: 30 mmol/L (ref 22–32)
Calcium: 9.6 mg/dL (ref 8.9–10.3)
Chloride: 103 mmol/L (ref 98–111)
Creatinine, Ser: 0.83 mg/dL (ref 0.44–1.00)
GFR, Estimated: >60 mL/min (ref >60)
Glucose, Bld: 112 mg/dL — ABNORMAL HIGH (ref 70–99)
Potassium: 3.7 mmol/L (ref 3.5–5.1)
Sodium: 140 mmol/L (ref 135–145)
Total Bilirubin: 0.3 mg/dL (ref 0.3–1.2)
Total Protein: 7.5 g/dL (ref 6.5–8.1)

## 2022-05-13 LAB — PROTEIN, URINE, RANDOM: Total Protein, Urine: <6 mg/dL

## 2022-05-13 MED ORDER — LIDOCAINE-PRILOCAINE 2.5-2.5 % EX CREA: TOPICAL_CREAM | CUTANEOUS | 3 refills | Status: DC

## 2022-05-13 MED ORDER — SODIUM CHLORIDE 0.9 % IV SOLN
Freq: Once | INTRAVENOUS | Status: AC
  Filled 2022-05-13: qty 250

## 2022-05-13 MED ORDER — PALONOSETRON HCL INJECTION 0.25 MG/5ML
0.2500 mg | Freq: Once | INTRAVENOUS | Status: AC
  Administered 2022-05-13: 0.25 mg via INTRAVENOUS

## 2022-05-13 MED ORDER — LEUCOVORIN CALCIUM INJECTION 350 MG
700.0000 mg | Freq: Once | INTRAVENOUS | Status: AC
  Administered 2022-05-13: 700 mg via INTRAVENOUS
  Filled 2022-05-13: qty 35

## 2022-05-13 MED ORDER — DEXTROSE 5 % IV SOLN
INTRAVENOUS | Status: DC
  Filled 2022-05-13: qty 250

## 2022-05-13 MED ORDER — PROCHLORPERAZINE MALEATE 10 MG PO TABS
ORAL_TABLET | ORAL | Status: AC
  Filled 2022-05-13: qty 1

## 2022-05-13 MED ORDER — PROCHLORPERAZINE MALEATE 10 MG PO TABS
10.0000 mg | ORAL_TABLET | Freq: Once | ORAL | Status: AC
  Administered 2022-05-13: 10 mg via ORAL

## 2022-05-13 MED ORDER — SODIUM CHLORIDE 0.9 % IV SOLN
2400.0000 mg/m2 | INTRAVENOUS | Status: DC
  Administered 2022-05-13: 4300 mg via INTRAVENOUS
  Filled 2022-05-13: qty 86

## 2022-05-13 MED ORDER — SODIUM CHLORIDE 0.9 % IV SOLN
5.0000 mg/kg | Freq: Once | INTRAVENOUS | Status: AC
  Administered 2022-05-13: 400 mg via INTRAVENOUS
  Filled 2022-05-13: qty 16

## 2022-05-13 MED ORDER — PALONOSETRON HCL INJECTION 0.25 MG/5ML
INTRAVENOUS | Status: AC
  Filled 2022-05-13: qty 5

## 2022-05-13 MED ORDER — OXALIPLATIN CHEMO INJECTION 100 MG/20ML
65.0000 mg/m2 | Freq: Once | INTRAVENOUS | Status: AC
  Administered 2022-05-13: 120 mg via INTRAVENOUS
  Filled 2022-05-13: qty 20

## 2022-05-13 NOTE — Assessment & Plan Note (Signed)
Follow-up with nutritionist.  Continue nutrition supplementation.  Encourage oral intake and hydration.

## 2022-05-13 NOTE — Patient Instructions (Signed)
[facility]  Discharge Instructions: Thank you for choosing [facility] to provide your oncology and hematology care.  If you have a lab appointment with the Cancer Center, please go directly to the Cancer Center and check in at the registration area.  Wear comfortable clothing and clothing appropriate for easy access to any Portacath or PICC line.   We strive to give you quality time with your provider. You may need to reschedule your appointment if you arrive late (15 or more minutes).  Arriving late affects you and other patients whose appointments are after yours.  Also, if you miss three or more appointments without notifying the office, you may be dismissed from the clinic at the provider's discretion.      For prescription refill requests, have your pharmacy contact our office and allow 72 hours for refills to be completed.    Today you received the following chemotherapy and/or immunotherapy agents Zirabev, Oxaliplatin, Leucovorin and Adrucil       To help prevent nausea and vomiting after your treatment, we encourage you to take your nausea medication as directed.  BELOW ARE SYMPTOMS THAT SHOULD BE REPORTED IMMEDIATELY: *FEVER GREATER THAN 100.4 F (38 C) OR HIGHER *CHILLS OR SWEATING *NAUSEA AND VOMITING THAT IS NOT CONTROLLED WITH YOUR NAUSEA MEDICATION *UNUSUAL SHORTNESS OF BREATH *UNUSUAL BRUISING OR BLEEDING *URINARY PROBLEMS (pain or burning when urinating, or frequent urination) *BOWEL PROBLEMS (unusual diarrhea, constipation, pain near the anus) TENDERNESS IN MOUTH AND THROAT WITH OR WITHOUT PRESENCE OF ULCERS (sore throat, sores in mouth, or a toothache) UNUSUAL RASH, SWELLING OR PAIN  UNUSUAL VAGINAL DISCHARGE OR ITCHING   Items with * indicate a potential emergency and should be followed up as soon as possible or go to the Emergency Department if any problems should occur.  Please show the CHEMOTHERAPY ALERT CARD or  IMMUNOTHERAPY ALERT CARD at check-in to the Emergency Department and triage nurse.  Should you have questions after your visit or need to cancel or reschedule your appointment, please contact [facility]  [phone] and follow the prompts.  Office hours are 8:00 a.m. to 4:30 p.m. Monday - Friday. Please note that voicemails left after 4:00 p.m. may not be returned until the following business day.  We are closed weekends and major holidays. You have access to a nurse at all times for urgent questions. Please call the main number to the clinic [phone] and follow the prompts.  For any non-urgent questions, you may also contact your provider using MyChart. We now offer e-Visits for anyone 18 and older to request care online for non-urgent symptoms. For details visit [email or web address].   Also download the MyChart app! Go to the app store, search "MyChart", open the app, select [place], and log in with your MyChart username and password.  Masks are optional in the cancer centers. If you would like for your care team to wear a mask while they are taking care of you, please let them know. For doctor visits, patients may have with them one support person who is at least 77 years old. At this time, visitors are not allowed in the infusion area.  

## 2022-05-13 NOTE — Assessment & Plan Note (Signed)
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 Bilateral fingertips, right worse than left Continue gabapentin 300 mg twice daily.

## 2022-05-13 NOTE — Assessment & Plan Note (Signed)
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  No sufficient tissue for NGS Liquid biopsy showed KRAS12V, APC, TP53 mutation, TMB 4.  S/p 6 cycles of 5-FU /Bevacizumab--> CT showed mixed response--> FOLFOX/bevacizumab.  Labs reviewed and discussed with patient Proceed with chemotherapy chemotherapy FOLFOX bevacizumab today-see below. CT imaging shows stable disease.   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  Stable disease.

## 2022-05-13 NOTE — Assessment & Plan Note (Signed)
patient gets prophylactic G-CSF with Udenyca on day 3.  Claritin 10 mg daily for 4 days.   

## 2022-05-13 NOTE — Progress Notes (Signed)
Hematology/Oncology Progress note [phone] [phone]         Patient Care Team: Jearld Fenton, NP as PCP - General (Internal Medicine) Clent Jacks, RN as Oncology Nurse Navigator [name], MD as Consulting Physician (Oncology)  ASSESSMENT & PLAN:   Cancer Staging  Metastatic colon cancer to liver [facility]) Staging form: Colon and Rectum - Neuroendocine Tumors, AJCC 8th Edition - Clinical stage from 09/16/2021: Stage IV (cTX, cNX, cM1) - Signed by [name], MD on 09/16/2021   Metastatic colon cancer to liver [facility]) #Metastatic colon cancer to liver, 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  No sufficient tissue for NGS Liquid biopsy showed KRAS12V, APC, TP53 mutation, TMB 4.  S/p 6 cycles of 5-FU /Bevacizumab--> CT showed mixed response--> FOLFOX/bevacizumab.  Labs reviewed and discussed with patient Proceed with chemotherapy chemotherapy FOLFOX bevacizumab today-see below. CT imaging shows stable disease.   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  Stable disease.  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 Bilateral fingertips, right worse than left Continue gabapentin 300 mg twice daily.  Chemotherapy induced neutropenia (HCC) patient gets prophylactic G-CSF with Udenyca on day 3.  Claritin 10 mg daily for 4 days.    Encounter for antineoplastic chemotherapy Chemotherapy plan as listed above  Weight loss Follow-up with nutritionist.  Continue nutrition supplementation.  Encourage oral intake and hydration.   I spent time and answered all patient's questions regarding why resuming chemotherapy is recommended.  All questions were answered to her satisfaction.  She agrees with proceeding with treatment today.   No orders of the defined types were placed in this encounter.  Follow-up in 2 weeks lab [name]. All questions were  answered. The patient knows to call the clinic with any problems, questions or concerns.  [name], MD, PhD [facility] Hematology Oncology 05/13/2022   CHIEF COMPLAINTS/REASON FOR VISIT:  metastatic colon cancer  HISTORY OF PRESENTING ILLNESS:   Chelsea [name] is a  76 y.o.  female presents for management of metastatic colon cancer. Oncology History  Metastatic colon cancer to liver ([place])  08/21/2021 Imaging   CT abdomen pelvis with contrast  showed soft tissue mass at the base of cecum measuring up to 5 cm, slightly increased in size.  Interval development of multiple low-density lesions throughout the liver highly suggestive for metastatic disease.  Complex mass in the region of the vaginal cuff measuring up to 5.2 cm, increased in size.  Right ovary also appears more prominent on the current examination.  Moderate large volume of stool throughout the colon.  Mild urinary bladder wall thickening.  Colonic diverticulosis without evidence of active diverticulitis.  Aortic atherosclerosis   09/04/2021 Procedure   patient is status post left lobe liver lesion biopsy and pathology showed metastatic adenocarcinoma, compatible with colorectal primary.   09/04/2021 Cancer Staging   Staging form: Colon and Rectum, AJCC 8th Edition - Clinical stage from 09/04/2021: Stage Unknown (cTX, cNX, cM1) - Signed by [name], MD on 05/13/2022 Stage prefix: Initial diagnosis   09/16/2021 Initial Diagnosis   Metastatic colon cancer to liver [facility] one liquid biopsy testing showed KRASG12V, APC TP53 TMB 4  Patient initially went to emergency room on 03/24/2021 for abdominal pain. 03/24/2021, CT scan of the abdomen showed marked bladder wall thickening with surrounding soft tissue stranding is noted.  Concerning for cystitis.  There is a lobulated mass between the posterior wall of the bladder  and the rectum which appears to arise from the vaginal cuff.  This is indeterminate and difficult to  characterize reflecting lack of IV contrast material.  Nonobstructing left renal calculus, left sacroiliitis, lumbar spondylosis aortic atherosclerosis. 03/24/2021 CT pelvic showed abdominal [place] soft tissue of right cecum, concerning for colon carcinoma.  Colonoscopy recommended.  Low-attenuation mass in the region of vaginal cuff is again noted.  Diffuse urinary bladder wall thickening and mild bilateral hydroureter possibly cystitis.   [place] 03/24/2021 Lobulated solid 3.8 cm mass confirmed by ultrasound. This is most compatible with a neoplasm but remains indeterminate as to the origin as the epicenter seems to be outside of both the vaginal cuff and the adjacent colon, while the lesion appears inseparable from both. Extensive echogenic debris within the distended urinary bladder.   03/26/2021, patient was seen by Dr. Theora Gianotti for evaluation of colonic/vaginal cuff pelvic mass.  Patient also was referred to gastroenterology for colonoscopy.   03/26/2021 CEA 3.4.  CA125 9.7 04/04/2021, status post colonoscopy which showed a frond like /villous nonobstructing large mass was found in the cecum, this was biopsied.  Terminal ileum was briefly intubated and appeared normal.  Diverticulosis in the sigmoid colon.  The rectum, sigmoid colon, descending colon, transverse colon and ascending colon were normal.  Nonbleeding internal hemorrhoids. 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 Given that the biopsy may not be representative of the entire underlying lesion.  Patient was recommended to establish with surgeon for evaluation. Patient no showed for her surgery appointment and as well as her follow-up appointment with gastroenterology.  Per daughter, patient was having cervical spine surgery and she prioritized that over the colon surgery.   10/20/2021 - 01/07/2022 Chemotherapy    COLORECTAL 5-FU  + Bevacizumab q14d      12/22/2021 Imaging   CT chest abdomen pelvis Decreased cecum  mass, however increased size and size of metastatic lesions throughout the liver.  Interval enlargement of vaginal cuff mass. No mets in chest.    01/21/2022 -  Chemotherapy   COLORECTAL FOLFOX  + Bevacizumab q14d    patient was seen by gynecology oncology Dr. Fransisca Connors.  Biopsy of the vaginal mass was not recommended.Patient's case was also discussed on multidisciplinary tumor board.  IR potentially can try a biopsy if patient is willing to.I had a discussion with patient's daughter over the phone prior to this visit.  We discussed about option of adding oxaliplatin to 5-FU/bevacizumab and continue treatment of colon cancer, repeat CT scan short-term and if progression, repeat biopsy versus proceeding with vaginal mass biopsy.  Daughter prefers proceeding with chemotherapy and defer biopsy.   01/08/2022 patient has had a second opinion at [facility] and was seen by Dr. Reynaldo Minium .  Dr. Reynaldo Minium agrees with the current treatment plan with FOLFOX/bevacizumab.  He has ordered guardant 360 to see if she may be eligible for clinical trial in the future.  -   04/13/2022 Imaging    CT chest abdomen pelvis showed a cecal mass with stable hepatic metastasis.  Mixed cystic and solid mass in the region of the vaginal cuff, new area of patchy groundglass and consolidation in the right upper lobe, lingula and left lower lobe.  Likely due to pneumonia.  Hepatic steatosis.  Bilateral renal stones.  Aortic atherosclerosis      INTERVAL HISTORY Chelsea [name] is a 77 y.o. female who has above history reviewed by me today presents for follow up visit for management of metastatic colon cancer Patient was accompanied  by her daughter Patient reports feeling well today.  Appetite is fair.  She has lost weight No nausea vomiting diarrhea. Chronic bilateral fingertip tingling/numbness, right worse than left.  Gabapentin partially relieves her symptoms.    Review of Systems  Constitutional:  Positive for fatigue.  Negative for appetite change, chills, fever and unexpected weight change.  HENT:   Negative for hearing loss and voice change.   Eyes:  Negative for eye problems.  Respiratory:  Negative for chest tightness and cough.   Cardiovascular:  Negative for chest pain.  Gastrointestinal:  Negative for abdominal distention, abdominal pain, blood in stool and diarrhea.  Endocrine: Negative for hot flashes.  Genitourinary:  Negative for difficulty urinating and frequency.   Musculoskeletal:  Positive for back pain. Negative for arthralgias.  Skin:  Negative for itching and rash.  Neurological:  Positive for numbness (chornic finger tips). Negative for extremity weakness.  Hematological:  Negative for adenopathy.  Psychiatric/Behavioral:  Negative for confusion.     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)    Moderate mitral insufficiency    Port-A-Cath in place    Seizures [facility])    Spinal stenosis    Ulcer    Urinary incontinence     SURGICAL HISTORY: Past Surgical History:  Procedure Laterality Date   ABDOMINAL HYSTERECTOMY     BACK SURGERY     due to polio   BREAST BIOPSY Bilateral    neg   BREAST BIOPSY Right 2011   neg/stereo   CARPAL TUNNEL RELEASE     CATARACT EXTRACTION Right 2020   COLONOSCOPY WITH PROPOFOL N/A 04/04/2021   Procedure: COLONOSCOPY WITH PROPOFOL;  Surgeon: Virgel Manifold, MD;  Location: [facility] ENDOSCOPY;  Service: Endoscopy;  Laterality: N/A;   EYE SURGERY     IR IMAGING GUIDED PORT INSERTION  10/09/2021    SOCIAL HISTORY: Social History   Socioeconomic History   Marital status: Divorced    Spouse name: Not on file   Number of children: Not on file   Years of education: Not on file   Highest education level: Not on file  Occupational History   Not on file  Tobacco Use   Smoking status: Never    Smokeless tobacco: Never  Vaping Use   Vaping Use: Never used  Substance and Sexual Activity   Alcohol use: Never   Drug use: No   Sexual activity: Not Currently  Other Topics Concern   Not on file  Social History Narrative   Not on file   Social Determinants of Health   Financial Resource Strain: Low Risk  (01/12/2022)   Overall Financial Resource Strain (CARDIA)    Difficulty of Paying Living Expenses: Not very hard  Food Insecurity: No Food Insecurity (01/12/2022)   Hunger Vital Sign    Worried About Running Out of Food in the Last Year: Never true    Ran Out of Food in the Last Year: Never true  Transportation Needs: No Transportation Needs (01/12/2022)   PRAPARE - Hydrologist (Medical): No    Lack of Transportation (Non-Medical): No  Physical Activity: Inactive (01/12/2022)   Exercise Vital Sign    Days of Exercise per Week: 0 days    Minutes of Exercise per Session: 0 min  Stress:  Stress Concern Present (01/12/2022)   [place]    Feeling of Stress : To some extent  Social Connections: Moderately Integrated (01/12/2022)   Social Connection and Isolation Panel [NHANES]    Frequency of Communication with Friends and Family: Three times a week    Frequency of Social Gatherings with Friends and Family: Three times a week    Attends Religious Services: 1 to 4 times per year    Active Member of Clubs or Organizations: No    Attends Archivist Meetings: 1 to 4 times per year    Marital Status: Widowed  Intimate Partner Violence: Not At Risk (01/12/2022)   Humiliation, Afraid, Rape, and Kick questionnaire    Fear of Current or Ex-Partner: No    Emotionally Abused: No    Physically Abused: No    Sexually Abused: No    FAMILY HISTORY: Family History  Problem Relation Age of Onset   Arthritis Mother    Heart disease Mother    Stroke Mother    Hypertension Mother    COPD  Mother    Sudden death Sister    Other Father        unknown medical history   Breast cancer Neg Hx     ALLERGIES:  is allergic to penicillins.  MEDICATIONS:  Current Outpatient Medications  Medication Sig Dispense Refill   acetaminophen (TYLENOL) 500 MG tablet Take 500 mg by mouth every 6 (six) hours as needed.     ALPRAZolam (XANAX) 0.5 MG tablet TAKE 1 TABLET BY MOUTH TWICE A DAY AS NEEDED FOR ANXIETY 60 tablet 0   aspirin 325 MG tablet Take 325 mg by mouth daily.     atorvastatin (LIPITOR) 10 MG tablet TAKE 1 TABLET BY MOUTH EVERY DAY 90 tablet 0   busPIRone (BUSPAR) 5 MG tablet Take 1 tablet (5 mg total) by mouth 3 (three) times daily. 270 tablet 1   Cyanocobalamin (VITAMIN B 12 PO) Take 500 mcg by mouth daily.     diclofenac Sodium (VOLTAREN) 1 % GEL Apply 4 g topically 4 (four) times daily. 4 g 0   docusate sodium (COLACE) 100 MG capsule Take 1 capsule (100 mg total) by mouth 2 (two) times daily. 60 capsule 0   FLUoxetine (PROZAC) 20 MG capsule TAKE 3 CAPSULES (60 MG TOTAL) BY MOUTH EVERY MORNING. 270 capsule 0   gabapentin (NEURONTIN) 300 MG capsule Take 1 capsule (300 mg total) by mouth 2 (two) times daily. 180 capsule 1   lidocaine (LIDODERM) 5 % Place 1 patch onto the skin daily. Remove & Discard patch within 12 hours or as directed by MD 1 patch 0   loperamide (IMODIUM) 2 MG capsule TAKE 1 CAP BY MOUTH SEE ADMIN INSTRUCTIONS. INITIAL: 4 MG, FOLLOWED BY 2 MG AFTER EACH LOOSE STOOL MAXIMUM: 16 MG/DAY 60 capsule 0   loratadine (CLARITIN) 10 MG tablet Take 1 tablet (10 mg total) by mouth See admin instructions. Take 1 tablet daily for 4 days after each chemotherapy treatments. 90 tablet 0   meclizine (ANTIVERT) 12.5 MG tablet Take 1 tablet (12.5 mg total) by mouth 3 (three) times daily as needed for dizziness. 30 tablet 0   methocarbamol (ROBAXIN) 500 MG tablet TAKE 1 TABLET TWICE DAILY AS NEEDED FOR MUSCLE SPASM(S) 60 tablet 0   ondansetron (ZOFRAN) 8 MG tablet Take 1 tablet (8 mg  total) by mouth 2 (two) times daily as needed (Nausea or vomiting). 30 tablet 1  polyethylene glycol powder (GLYCOLAX/MIRALAX) 17 GM/SCOOP powder Take 17 g by mouth daily. 3350 g 1   potassium chloride SA (KLOR-CON M20) 20 MEQ tablet Take 1 tablet (20 mEq total) by mouth daily. 30 tablet 1   prochlorperazine (COMPAZINE) 10 MG tablet Take 1 tablet (10 mg total) by mouth every 6 (six) hours as needed (Nausea or vomiting). 30 tablet 1   triamterene-hydrochlorothiazide (MAXZIDE-25) 37.5-25 MG tablet TAKE 1 TABLET BY MOUTH EVERY DAY 90 tablet 0   vitamin E 180 MG (400 UNITS) capsule Take 400 Units by mouth daily.     lidocaine-prilocaine (EMLA) cream Apply small amount of cream to port site 1-2 hour prior to chemo treatment. 30 g 3   No current facility-administered medications for this visit.   Facility-Administered Medications Ordered in Other Visits  Medication Dose Route Frequency Provider Last Rate Last Admin   palonosetron (ALOXI) 0.25 MG/5ML injection            prochlorperazine (COMPAZINE) 10 MG tablet              PHYSICAL EXAMINATION: ECOG PERFORMANCE STATUS: 2 - Symptomatic, <50% confined to bed Vitals:   05/13/22 0854  BP: 102/62  Pulse: 74  Temp: 98.2 F (36.8 C)    Filed Weights   05/13/22 0854  Weight: 161 lb (73 kg)      Physical Exam Constitutional:      General: She is not in acute distress.    Comments: Patient sits in wheelchair  HENT:     Head: Normocephalic and atraumatic.  Eyes:     General: No scleral icterus. Cardiovascular:     Rate and Rhythm: Normal rate and regular rhythm.     Heart sounds: Normal heart sounds.  Pulmonary:     Effort: Pulmonary effort is normal. No respiratory distress.     Breath sounds: No wheezing.  Abdominal:     General: Bowel sounds are normal. There is no distension.     Palpations: Abdomen is soft.  Musculoskeletal:        General: No deformity. Normal range of motion.     Cervical back: Normal range of motion and  neck supple.  Skin:    General: Skin is warm and dry.     Findings: No erythema or rash.  Neurological:     Mental Status: She is alert and oriented to person, place, and time. Mental status is at baseline.     Cranial Nerves: No cranial nerve deficit.     Coordination: Coordination normal.  Psychiatric:        Mood and Affect: Mood normal.     LABORATORY DATA:  I have reviewed the data as listed     Latest Ref Rng & Units 05/13/2022    8:37 AM 04/29/2022    8:29 AM 04/15/2022    8:36 AM  CBC  WBC 4.0 - 10.5 K/uL 5.0  3.5  5.1   Hemoglobin 12.0 - 15.0 g/dL 11.4  10.0  10.1   Hematocrit 36.0 - 46.0 % 36.1  31.5  32.0   Platelets 150 - 400 K/uL 126  167  237       Latest Ref Rng & Units 05/13/2022    8:37 AM 04/29/2022    8:29 AM 04/15/2022    8:36 AM  CMP  Glucose 70 - 99 mg/dL 112  91  126   BUN 8 - 23 mg/dL 12  15  15    Creatinine 0.44 - 1.00 mg/dL 0.83  0.81  0.75   Sodium 135 - 145 mmol/L 140  140  140   Potassium 3.5 - 5.1 mmol/L 3.7  3.8  3.3   Chloride 98 - 111 mmol/L 103  103  103   CO2 22 - 32 mmol/L 30  29  29    Calcium 8.9 - 10.3 mg/dL 9.6  9.4  9.1   Total Protein 6.5 - 8.1 g/dL 7.5  7.2  7.0   Total Bilirubin 0.3 - 1.2 mg/dL 0.3  0.8  0.6   Alkaline Phos 38 - 126 U/L 90  68  86   AST 15 - 41 U/L 25  23  28    ALT 0 - 44 U/L 16  18  17       RADIOGRAPHIC STUDIES: I have personally reviewed the radiological images as listed and agreed with the findings in the report. CT CHEST ABDOMEN PELVIS W CONTRAST  Result Date: 04/13/2022 CLINICAL DATA:  Colon mass, liver cancer diagnosed December 2022. * Tracking Code: BO * EXAM: CT CHEST, ABDOMEN, AND PELVIS WITH CONTRAST TECHNIQUE: Multidetector CT imaging of the chest, abdomen and pelvis was performed following the standard protocol during bolus administration of intravenous contrast. RADIATION DOSE REDUCTION: This exam was performed according to the departmental dose-optimization program which includes automated exposure  control, adjustment of the mA and/or kV according to patient size and/or use of iterative reconstruction technique. CONTRAST:  125m OMNIPAQUE IOHEXOL 300 MG/ML  SOLN COMPARISON:  12/22/2021. FINDINGS: CT CHEST FINDINGS Cardiovascular: Left IJ Port-A-Cath terminates in the right atrium. Atherosclerotic calcification of the aorta and aortic valve. Heart is enlarged. No pericardial effusion. Mediastinum/Nodes: No pathologically enlarged mediastinal, hilar or axillary lymph nodes. Calcified mediastinal and hilar lymph nodes. Esophagus is unremarkable. Lungs/Pleura: Calcified granulomas. Calcified pleural plaques in the right hemithorax. New areas of ground-glass and consolidation in the right upper lobe, lingula and left lower lobe. No pleural fluid. Airway is unremarkable. Musculoskeletal: T8 and T9 vertebral body fusion with a kyphotic deformity. Degenerative changes in the spine. No worrisome lytic or sclerotic lesions. CT ABDOMEN PELVIS FINDINGS Hepatobiliary: Liver is decreased in attenuation diffusely. Mildly hypoattenuating lesions in the liver measure up to 2.3 cm in the left hepatic lobe, unchanged. Gallbladder is mildly distended. No biliary ductal dilatation. Pancreas: Negative. Spleen: Negative. Adrenals/Urinary Tract: Adrenal glands are unremarkable. Scarring in the kidneys bilaterally. Bilateral renal stones. Ureters are decompressed. Left posterolateral bladder diverticulum. Bladder is otherwise grossly unremarkable. Stomach/Bowel: Stomach, small bowel and appendix are unremarkable. Eccentric irregular wall thickening in the cecum (2/68), similar. Stool is seen in the majority of the colon, indicative of constipation. Vascular/Lymphatic: Atherosclerotic calcification of the aorta. No pathologically enlarged lymph nodes. Gastrohepatic ligament lymph nodes are subcentimeter in short axis size. Reproductive: Hysterectomy. Heterogeneous mixed cystic and solid mass in the region of the vaginal cuff measures  3.8 x 5.6 cm, stable from 12/22/2021. Other: No free fluid.  Mesenteries and peritoneum are unremarkable. Musculoskeletal: Degenerative changes in the spine. No worrisome lytic or sclerotic lesions. IMPRESSION: 1. 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 2. Mixed cystic and solid mass in the region of the vaginal cuff, worrisome for malignancy. 3. New areas of patchy ground-glass and consolidation in the right upper lobe, lingula and left lower lobe, likely due to pneumonia. Treatment later organizing pneumonia not excluded. 4. Hepatic steatosis. 5. Bilateral renal stones. 6.  Aortic atherosclerosis (ICD10-I70.0). Electronically Signed   By: MLorin PicketM.D.   On: 04/13/2022 16:26   DG Lumbar Spine Complete  Result Date: 02/20/2022 CLINICAL DATA:  Low back pain EXAM: LUMBAR SPINE - COMPLETE 4+ VIEW COMPARISON:  None Available. FINDINGS: Lumbar vertebral body height are preserved without evidence of fracture. Grade 1 anterolisthesis of L4 on L5. Moderate intervertebral disc space narrowing at L5-S1. Facet arthropathy throughout the lumbar spine. No spondylolysis identified. IMPRESSION: Degenerative changes of the lumbar spine with no acute fracture identified. Electronically Signed   By: Ofilia Neas M.D.   On: 02/20/2022 15:46   DG Shoulder Right  Result Date: 02/20/2022 CLINICAL DATA:  Right shoulder pain EXAM: RIGHT SHOULDER - 2+ VIEW COMPARISON:  Right shoulder x-ray 07/08/2019 FINDINGS: No acute fracture or dislocation identified. Moderate to severe narrowing of the glenohumeral joint with large inferior marginal osteophytes. Mild narrowing of the acromioclavicular joint. Small likely subcortical cysts at the greater tuberosity of the humerus. IMPRESSION: Degenerative changes of the shoulder as described. Electronically Signed   By: Ofilia Neas M.D.   On: 02/20/2022 15:45

## 2022-05-13 NOTE — Progress Notes (Signed)
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  Patient receiving folfox and bevacizumab.  Met with patient and daughter, [name] in infusion.  Patient reports that this time her appetite has been down. Reports a tingling on her tongue with cold foods and beverages.  Says that she drank an ensure before coming to clinic today.  Patient has caregiver for few hours each day that helps her with meals.    Medications: reviewed  Labs: reviewed  Anthropometrics:   Weight 161 lb today  171 lb on 6/7 178 lb on 4/19 174 lb on 3/22 178 lb on 1/11  6% weight loss in the last 2 months, significant   NUTRITION DIAGNOSIS: Inadequate oral intake continues    INTERVENTION:  Encouraged setting a schedule to nibble/mini meal. Discussed ways to add calories and protein in small volume of foods.  Handout provided Encouraged 350 calorie shake or higher Contact information given    MONITORING, EVALUATION, GOAL: weight trends, intake   NEXT VISIT: Wednesday, August 23 during infusion  Aneya Daddona B. Zenia Resides, [place], [place] Registered Dietitian [phone]

## 2022-05-13 NOTE — Assessment & Plan Note (Signed)
Chemotherapy plan as listed above 

## 2022-05-14 ENCOUNTER — Other Ambulatory Visit: Payer: Self-pay | Admitting: Internal Medicine

## 2022-05-14 DIAGNOSIS — F411 Generalized anxiety disorder: Secondary | ICD-10-CM

## 2022-05-14 NOTE — Telephone Encounter (Signed)
Requested medication (s) are due for refill today - yes  Requested medication (s) are on the active medication list -yes  Future visit scheduled -no  Last refill: 04/14/22 #60  Notes to clinic: non delegated Rx  Requested Prescriptions  Pending Prescriptions Disp Refills   ALPRAZolam (XANAX) 0.5 MG tablet [Pharmacy Med Name: ALPRAZOLAM 0.5 MG TABLET] 60 tablet 0    Sig: TAKE 1 TABLET BY MOUTH TWICE A DAY AS NEEDED FOR ANXIETY     Not Delegated - Psychiatry: Anxiolytics/Hypnotics 2 Failed - 05/14/2022  1:20 PM      Failed - This refill cannot be delegated      Failed - Urine Drug Screen completed in last 360 days      Passed - Patient is not pregnant      Passed - Valid encounter within last 6 months    Recent Outpatient Visits           2 weeks ago Aortic atherosclerosis ([place])   [facility] [place], Coralie Keens, NP   2 months ago Right leg weakness   [facility] [place], Coralie Keens, NP   6 months ago Pure hypercholesterolemia   [address], Coralie Keens, NP   8 months ago 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 [facility])   [facility] [place], Coralie Keens, NP   10 months ago [facility] [place], Coralie Keens, NP                 Requested Prescriptions  Pending Prescriptions Disp Refills   ALPRAZolam (XANAX) 0.5 MG tablet [Pharmacy Med Name: ALPRAZOLAM 0.5 MG TABLET] 60 tablet 0    Sig: TAKE 1 TABLET BY MOUTH TWICE A DAY AS NEEDED FOR ANXIETY     Not Delegated - Psychiatry: Anxiolytics/Hypnotics 2 Failed - 05/14/2022  1:20 PM      Failed - This refill cannot be delegated      Failed - Urine Drug Screen completed in last 360 days      Passed - Patient is not pregnant      Passed - Valid encounter within last 6 months    Recent Outpatient Visits           2 weeks ago Aortic atherosclerosis [facility])   [facility] [place], Coralie Keens, NP   2 months ago Right leg weakness   [facility] [place], Coralie Keens, NP   6 months ago Pure hypercholesterolemia   [place], Coralie Keens, NP   8 months ago 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 [facility])   [facility] [place], Coralie Keens, NP   10 months ago [facility] [place], Coralie Keens, [place]

## 2022-05-15 ENCOUNTER — Inpatient Hospital Stay: Payer: Medicare HMO

## 2022-05-15 VITALS — BP 112/69 | HR 72 | Temp 97.9°F | Resp 18

## 2022-05-15 DIAGNOSIS — Z5189 Encounter for other specified aftercare: Secondary | ICD-10-CM | POA: Diagnosis not present

## 2022-05-15 DIAGNOSIS — C787 Secondary malignant neoplasm of liver and intrahepatic bile duct: Secondary | ICD-10-CM | POA: Diagnosis not present

## 2022-05-15 DIAGNOSIS — Z5111 Encounter for antineoplastic chemotherapy: Secondary | ICD-10-CM | POA: Diagnosis not present

## 2022-05-15 DIAGNOSIS — C189 Malignant neoplasm of colon, unspecified: Secondary | ICD-10-CM

## 2022-05-15 DIAGNOSIS — Z5112 Encounter for antineoplastic immunotherapy: Secondary | ICD-10-CM | POA: Diagnosis not present

## 2022-05-15 DIAGNOSIS — Z79899 Other long term (current) drug therapy: Secondary | ICD-10-CM | POA: Diagnosis not present

## 2022-05-15 MED ORDER — PEGFILGRASTIM-CBQV 6 MG/0.6ML ~~LOC~~ SOSY
6.0000 mg | PREFILLED_SYRINGE | Freq: Once | SUBCUTANEOUS | Status: AC
  Administered 2022-05-15: 6 mg via SUBCUTANEOUS
  Filled 2022-05-15: qty 0.6

## 2022-05-15 MED ORDER — HEPARIN SOD (PORK) LOCK FLUSH 100 UNIT/ML IV SOLN
500.0000 [IU] | Freq: Once | INTRAVENOUS | Status: AC | PRN
  Administered 2022-05-15: 500 [IU]
  Filled 2022-05-15: qty 5

## 2022-05-15 MED ORDER — HEPARIN SOD (PORK) LOCK FLUSH 100 UNIT/ML IV SOLN
INTRAVENOUS | Status: AC
  Filled 2022-05-15: qty 5

## 2022-05-15 NOTE — Patient Instructions (Signed)
[facility] [place]  Discharge Instructions: Thank you for choosing [facility] to provide your oncology and hematology care.  If you have a lab appointment with the Cancer Center, please go directly to the Cancer Center and check in at the registration area.  Wear comfortable clothing and clothing appropriate for easy access to any Portacath or PICC line.   We strive to give you quality time with your provider. You may need to reschedule your appointment if you arrive late (15 or more minutes).  Arriving late affects you and other patients whose appointments are after yours.  Also, if you miss three or more appointments without notifying the office, you may be dismissed from the clinic at the provider's discretion.      For prescription refill requests, have your pharmacy contact our office and allow 72 hours for refills to be completed.    Today you received the following chemotherapy and/or immunotherapy agents Udenyca      To help prevent nausea and vomiting after your treatment, we encourage you to take your nausea medication as directed.  BELOW ARE SYMPTOMS THAT SHOULD BE REPORTED IMMEDIATELY: *FEVER GREATER THAN 100.4 F (38 C) OR HIGHER *CHILLS OR SWEATING *NAUSEA AND VOMITING THAT IS NOT CONTROLLED WITH YOUR NAUSEA MEDICATION *UNUSUAL SHORTNESS OF BREATH *UNUSUAL BRUISING OR BLEEDING *URINARY PROBLEMS (pain or burning when urinating, or frequent urination) *BOWEL PROBLEMS (unusual diarrhea, constipation, pain near the anus) TENDERNESS IN MOUTH AND THROAT WITH OR WITHOUT PRESENCE OF ULCERS (sore throat, sores in mouth, or a toothache) UNUSUAL RASH, SWELLING OR PAIN  UNUSUAL VAGINAL DISCHARGE OR ITCHING   Items with * indicate a potential emergency and should be followed up as soon as possible or go to the Emergency Department if any problems should occur.  Please show the CHEMOTHERAPY ALERT CARD or IMMUNOTHERAPY ALERT CARD at check-in to the  Emergency Department and triage nurse.  Should you have questions after your visit or need to cancel or reschedule your appointment, please contact [facility] [place]  [phone] and follow the prompts.  Office hours are 8:00 a.m. to 4:30 p.m. Monday - Friday. Please note that voicemails left after 4:00 p.m. may not be returned until the following business day.  We are closed weekends and major holidays. You have access to a nurse at all times for urgent questions. Please call the main number to the clinic [phone] and follow the prompts.  For any non-urgent questions, you may also contact your provider using MyChart. We now offer e-Visits for anyone 18 and older to request care online for non-urgent symptoms. For details visit [email or web address].   Also download the MyChart app! Go to the app store, search "MyChart", open the app, select [place], and log in with your MyChart username and password.  Masks are optional in the cancer centers. If you would like for your care team to wear a mask while they are taking care of you, please let them know. For doctor visits, patients may have with them one support person who is at least 77 years old. At this time, visitors are not allowed in the infusion area.   

## 2022-05-17 ENCOUNTER — Other Ambulatory Visit: Payer: Self-pay | Admitting: Internal Medicine

## 2022-05-18 NOTE — Telephone Encounter (Signed)
Requested medication (s) are due for refill today: yes  Requested medication (s) are on the active medication list: yes  Last refill:  04/01/22 #60 with 0 RF  Future visit scheduled: no, just seen 04/28/22  Notes to clinic:  This medication can not be delegated, please assess.        Requested Prescriptions  Pending Prescriptions Disp Refills   methocarbamol (ROBAXIN) 500 MG tablet [Pharmacy Med Name: METHOCARBAMOL 500 MG Tablet] 60 tablet 0    Sig: TAKE 1 TABLET TWICE DAILY AS NEEDED FOR MUSCLE SPASM(S)     Not Delegated - Analgesics:  Muscle Relaxants Failed - 05/17/2022  7:43 AM      Failed - This refill cannot be delegated      Passed - Valid encounter within last 6 months    Recent Outpatient Visits           2 weeks ago Aortic atherosclerosis [facility])   [facility] [place], Coralie Keens, NP   2 months ago Right leg weakness   [facility] [place], Coralie Keens, NP   6 months ago Pure hypercholesterolemia   [place], Coralie Keens, NP   8 months ago 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 [facility])   [facility] [place], Coralie Keens, NP   10 months ago [facility] [place], Coralie Keens, NP

## 2022-05-19 ENCOUNTER — Encounter: Payer: Self-pay | Admitting: Oncology

## 2022-05-19 ENCOUNTER — Other Ambulatory Visit: Payer: Self-pay | Admitting: *Deleted

## 2022-05-19 DIAGNOSIS — C189 Malignant neoplasm of colon, unspecified: Secondary | ICD-10-CM

## 2022-05-19 DIAGNOSIS — R531 Weakness: Secondary | ICD-10-CM

## 2022-05-19 DIAGNOSIS — R630 Anorexia: Secondary | ICD-10-CM

## 2022-05-20 ENCOUNTER — Other Ambulatory Visit: Payer: Medicare HMO

## 2022-05-20 ENCOUNTER — Inpatient Hospital Stay: Payer: Medicare HMO

## 2022-05-20 ENCOUNTER — Inpatient Hospital Stay (HOSPITAL_BASED_OUTPATIENT_CLINIC_OR_DEPARTMENT_OTHER): Payer: Medicare HMO | Admitting: Hospice and Palliative Medicine

## 2022-05-20 VITALS — BP 111/66 | HR 78 | Temp 96.2°F | Resp 16 | Wt 154.0 lb

## 2022-05-20 DIAGNOSIS — Z5111 Encounter for antineoplastic chemotherapy: Secondary | ICD-10-CM | POA: Diagnosis not present

## 2022-05-20 DIAGNOSIS — E876 Hypokalemia: Secondary | ICD-10-CM

## 2022-05-20 DIAGNOSIS — C189 Malignant neoplasm of colon, unspecified: Secondary | ICD-10-CM

## 2022-05-20 DIAGNOSIS — R531 Weakness: Secondary | ICD-10-CM

## 2022-05-20 DIAGNOSIS — C787 Secondary malignant neoplasm of liver and intrahepatic bile duct: Secondary | ICD-10-CM | POA: Diagnosis not present

## 2022-05-20 DIAGNOSIS — Z5112 Encounter for antineoplastic immunotherapy: Secondary | ICD-10-CM | POA: Diagnosis not present

## 2022-05-20 DIAGNOSIS — Z95828 Presence of other vascular implants and grafts: Secondary | ICD-10-CM

## 2022-05-20 DIAGNOSIS — Z79899 Other long term (current) drug therapy: Secondary | ICD-10-CM | POA: Diagnosis not present

## 2022-05-20 DIAGNOSIS — Z5189 Encounter for other specified aftercare: Secondary | ICD-10-CM | POA: Diagnosis not present

## 2022-05-20 DIAGNOSIS — R630 Anorexia: Secondary | ICD-10-CM

## 2022-05-20 LAB — CBC WITH DIFFERENTIAL/PLATELET
Abs Immature Granulocytes: 0.13 10*3/uL — ABNORMAL HIGH (ref 0.00–0.07)
Basophils Absolute: 0.1 10*3/uL (ref 0.0–0.1)
Basophils Relative: 1 %
Eosinophils Absolute: 0.1 10*3/uL (ref 0.0–0.5)
Eosinophils Relative: 1 %
HCT: 35.9 % — ABNORMAL LOW (ref 36.0–46.0)
Hemoglobin: 11.4 g/dL — ABNORMAL LOW (ref 12.0–15.0)
Immature Granulocytes: 1 %
Lymphocytes Relative: 15 %
Lymphs Abs: 1.6 10*3/uL (ref 0.7–4.0)
MCH: 32.4 pg (ref 26.0–34.0)
MCHC: 31.8 g/dL (ref 30.0–36.0)
MCV: 102 fL — ABNORMAL HIGH (ref 80.0–100.0)
Monocytes Absolute: 0.6 10*3/uL (ref 0.1–1.0)
Monocytes Relative: 6 %
Neutro Abs: 8.4 10*3/uL — ABNORMAL HIGH (ref 1.7–7.7)
Neutrophils Relative %: 76 %
Platelets: 132 10*3/uL — ABNORMAL LOW (ref 150–400)
RBC: 3.52 MIL/uL — ABNORMAL LOW (ref 3.87–5.11)
RDW: 15.1 % (ref 11.5–15.5)
Smear Review: NORMAL
WBC: 10.8 10*3/uL — ABNORMAL HIGH (ref 4.0–10.5)
nRBC: 0 % (ref 0.0–0.2)

## 2022-05-20 LAB — COMPREHENSIVE METABOLIC PANEL
ALT: 18 U/L (ref 0–44)
AST: 29 U/L (ref 15–41)
Albumin: 3.8 g/dL (ref 3.5–5.0)
Alkaline Phosphatase: 130 U/L — ABNORMAL HIGH (ref 38–126)
Anion gap: 10 (ref 5–15)
BUN: 19 mg/dL (ref 8–23)
CO2: 26 mmol/L (ref 22–32)
Calcium: 9.2 mg/dL (ref 8.9–10.3)
Chloride: 103 mmol/L (ref 98–111)
Creatinine, Ser: 0.92 mg/dL (ref 0.44–1.00)
GFR, Estimated: >60 mL/min (ref >60)
Glucose, Bld: 121 mg/dL — ABNORMAL HIGH (ref 70–99)
Potassium: 3.1 mmol/L — ABNORMAL LOW (ref 3.5–5.1)
Sodium: 139 mmol/L (ref 135–145)
Total Bilirubin: 1 mg/dL (ref 0.3–1.2)
Total Protein: 7.8 g/dL (ref 6.5–8.1)

## 2022-05-20 MED ORDER — SODIUM CHLORIDE 0.9 % IV SOLN
INTRAVENOUS | Status: DC
  Filled 2022-05-20: qty 250

## 2022-05-20 MED ORDER — POTASSIUM CHLORIDE 20 MEQ/100ML IV SOLN
20.0000 meq | Freq: Once | INTRAVENOUS | Status: AC
  Administered 2022-05-20: 20 meq via INTRAVENOUS

## 2022-05-20 MED ORDER — HEPARIN SOD (PORK) LOCK FLUSH 100 UNIT/ML IV SOLN
500.0000 [IU] | Freq: Once | INTRAVENOUS | Status: AC
  Administered 2022-05-20: 500 [IU] via INTRAVENOUS
  Filled 2022-05-20: qty 5

## 2022-05-20 MED ORDER — SODIUM CHLORIDE 0.9% FLUSH
10.0000 mL | Freq: Once | INTRAVENOUS | Status: AC
  Administered 2022-05-20: 10 mL via INTRAVENOUS
  Filled 2022-05-20: qty 10

## 2022-05-20 NOTE — Progress Notes (Signed)
Pt reports decreased appetite, nausea and vomiting since yesterday. She denies any vomiting today, but still feels nauseated. She also endorses weakness and fatigue.

## 2022-05-20 NOTE — Progress Notes (Signed)
Symptom Management [place] at [facility] Telephone:([phone] [phone]  Patient Care Team: Jearld Fenton, NP as PCP - General (Internal Medicine) Clent Jacks, RN as Oncology Nurse Navigator [name], MD as Consulting Physician (Oncology)   NAME OF PATIENT: Chelsea [name]  [id number]  1946/10/05   DATE OF VISIT: 05/20/22  REASON FOR CONSULT: Chelsea [name] is a 76 y.o. 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  Patient is on chemotherapy as FOLFOX plus Bev   INTERVAL HISTORY: Patient received cycle thirteen 5-FU plus Bev on 05/13/2022.  She verbalized some frustration that her daughter made her come into the clinic today as she does not think that she needed evaluation.  However, she does endorse some nausea and intermittent vomiting yesterday but states that she feels better today.  Denies any neurologic complaints. Denies recent fevers or illnesses. Denies any easy bleeding or bruising. Reports fair appetite and denies weight loss. Denies chest pain. Denies any constipation, or diarrhea. Denies urinary complaints. Patient offers no further specific complaints today.   PAST MEDICAL HISTORY: 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)    Moderate mitral insufficiency    Port-A-Cath in place    Seizures ([place])    Spinal stenosis    Ulcer    Urinary incontinence     PAST SURGICAL HISTORY:  Past Surgical History:  Procedure Laterality Date   ABDOMINAL HYSTERECTOMY     BACK SURGERY     due to polio   BREAST BIOPSY Bilateral    neg   BREAST BIOPSY Right 2011   neg/stereo   CARPAL TUNNEL RELEASE     CATARACT EXTRACTION Right 2020   COLONOSCOPY WITH PROPOFOL N/A 04/04/2021   Procedure: COLONOSCOPY WITH PROPOFOL;  Surgeon: Virgel Manifold,  MD;  Location: [facility] ENDOSCOPY;  Service: Endoscopy;  Laterality: N/A;   EYE SURGERY     IR IMAGING GUIDED PORT INSERTION  10/09/2021    HEMATOLOGY/ONCOLOGY HISTORY:  Oncology History  Metastatic colon cancer to liver ([place])  08/21/2021 Imaging   CT abdomen pelvis with contrast  showed soft tissue mass at the base of cecum measuring up to 5 cm, slightly increased in size.  Interval development of multiple low-density lesions throughout the liver highly suggestive for metastatic disease.  Complex mass in the region of the vaginal cuff measuring up to 5.2 cm, increased in size.  Right ovary also appears more prominent on the current examination.  Moderate large volume of stool throughout the colon.  Mild urinary bladder wall thickening.  Colonic diverticulosis without evidence of active diverticulitis.  Aortic atherosclerosis   09/04/2021 Procedure   patient is status post left lobe liver lesion biopsy and pathology showed metastatic adenocarcinoma, compatible with colorectal primary.   09/04/2021 Cancer Staging   Staging form: Colon and Rectum, AJCC 8th Edition - Clinical stage from 09/04/2021: Stage Unknown (cTX, cNX, cM1) - Signed by [name], MD on 05/13/2022 Stage prefix: Initial diagnosis   09/16/2021 Initial Diagnosis   Metastatic colon cancer to liver [facility]) [facility] one liquid biopsy testing showed [id number], APC TP53 TMB 4  Patient initially went to emergency room on 03/24/2021 for abdominal pain. 03/24/2021, CT scan of the abdomen showed marked bladder wall thickening with surrounding soft tissue stranding  is noted.  Concerning for cystitis.  There is a lobulated mass between the posterior wall of the bladder and the rectum which appears to arise from the vaginal cuff.  This is indeterminate and difficult to characterize reflecting lack of IV contrast material.  Nonobstructing left renal calculus, left sacroiliitis, lumbar spondylosis aortic atherosclerosis. 03/24/2021 CT pelvic showed  abdominal [place] soft tissue of right cecum, concerning for colon carcinoma.  Colonoscopy recommended.  Low-attenuation mass in the region of vaginal cuff is again noted.  Diffuse urinary bladder wall thickening and mild bilateral hydroureter possibly cystitis.   [place] 03/24/2021 Lobulated solid 3.8 cm mass confirmed by ultrasound. This is most compatible with a neoplasm but remains indeterminate as to the origin as the epicenter seems to be outside of both the vaginal cuff and the adjacent colon, while the lesion appears inseparable from both. Extensive echogenic debris within the distended urinary bladder.   03/26/2021, patient was seen by Dr. Theora Gianotti for evaluation of colonic/vaginal cuff pelvic mass.  Patient also was referred to gastroenterology for colonoscopy.   03/26/2021 CEA 3.4.  CA125 9.7 04/04/2021, status post colonoscopy which showed a frond like /villous nonobstructing large mass was found in the cecum, this was biopsied.  Terminal ileum was briefly intubated and appeared normal.  Diverticulosis in the sigmoid colon.  The rectum, sigmoid colon, descending colon, transverse colon and ascending colon were normal.  Nonbleeding internal hemorrhoids. 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 Given that the biopsy may not be representative of the entire underlying lesion.  Patient was recommended to establish with surgeon for evaluation. Patient no showed for her surgery appointment and as well as her follow-up appointment with gastroenterology.  Per daughter, patient was having cervical spine surgery and she prioritized that over the colon surgery.   10/20/2021 - 01/07/2022 Chemotherapy    COLORECTAL 5-FU  + Bevacizumab q14d      10/20/2021 -  Chemotherapy   Patient is on Treatment Plan : COLORECTAL 5FU + Leucovorin (Modified [place]) + Bevacizumab q14d     12/22/2021 Imaging   CT chest abdomen pelvis Decreased cecum mass, however increased size and size of metastatic  lesions throughout the liver.  Interval enlargement of vaginal cuff mass. No mets in chest.    01/21/2022 -  Chemotherapy   COLORECTAL FOLFOX  + Bevacizumab q14d    patient was seen by gynecology oncology Dr. Fransisca Connors.  Biopsy of the vaginal mass was not recommended.Patient's case was also discussed on multidisciplinary tumor board.  IR potentially can try a biopsy if patient is willing to.I had a discussion with patient's daughter over the phone prior to this visit.  We discussed about option of adding oxaliplatin to 5-FU/bevacizumab and continue treatment of colon cancer, repeat CT scan short-term and if progression, repeat biopsy versus proceeding with vaginal mass biopsy.  Daughter prefers proceeding with chemotherapy and defer biopsy.   01/08/2022 patient has had a second opinion at [facility] and was seen by Dr. Reynaldo Minium .  Dr. Reynaldo Minium agrees with the current treatment plan with FOLFOX/bevacizumab.  He has ordered guardant 360 to see if she may be eligible for clinical trial in the future.  -   04/13/2022 Imaging    CT chest abdomen pelvis showed a cecal mass with stable hepatic metastasis.  Mixed cystic and solid mass in the region of the vaginal cuff, new area of patchy groundglass and consolidation in the right upper lobe, lingula and left lower lobe.  Likely due to pneumonia.  Hepatic steatosis.  Bilateral renal stones.  Aortic atherosclerosis     ALLERGIES:  is allergic to penicillins.  MEDICATIONS:  Current Outpatient Medications  Medication Sig Dispense Refill   acetaminophen (TYLENOL) 500 MG tablet Take 500 mg by mouth every 6 (six) hours as needed.     ALPRAZolam (XANAX) 0.5 MG tablet TAKE 1 TABLET BY MOUTH TWICE A DAY AS NEEDED FOR ANXIETY 60 tablet 0   aspirin 325 MG tablet Take 325 mg by mouth daily.     atorvastatin (LIPITOR) 10 MG tablet TAKE 1 TABLET BY MOUTH EVERY DAY 90 tablet 0   busPIRone (BUSPAR) 5 MG tablet Take 1 tablet (5 mg total) by mouth 3 (three) times daily. 270  tablet 1   Cyanocobalamin (VITAMIN B 12 PO) Take 500 mcg by mouth daily.     diclofenac Sodium (VOLTAREN) 1 % GEL Apply 4 g topically 4 (four) times daily. 4 g 0   docusate sodium (COLACE) 100 MG capsule Take 1 capsule (100 mg total) by mouth 2 (two) times daily. 60 capsule 0   FLUoxetine (PROZAC) 20 MG capsule TAKE 3 CAPSULES (60 MG TOTAL) BY MOUTH EVERY MORNING. 270 capsule 0   gabapentin (NEURONTIN) 300 MG capsule Take 1 capsule (300 mg total) by mouth 2 (two) times daily. 180 capsule 1   lidocaine (LIDODERM) 5 % Place 1 patch onto the skin daily. Remove & Discard patch within 12 hours or as directed by MD 1 patch 0   lidocaine-prilocaine (EMLA) cream Apply small amount of cream to port site 1-2 hour prior to chemo treatment. 30 g 3   loperamide (IMODIUM) 2 MG capsule TAKE 1 CAP BY MOUTH SEE ADMIN INSTRUCTIONS. INITIAL: 4 MG, FOLLOWED BY 2 MG AFTER EACH LOOSE STOOL MAXIMUM: 16 MG/DAY 60 capsule 0   loratadine (CLARITIN) 10 MG tablet Take 1 tablet (10 mg total) by mouth See admin instructions. Take 1 tablet daily for 4 days after each chemotherapy treatments. 90 tablet 0   meclizine (ANTIVERT) 12.5 MG tablet Take 1 tablet (12.5 mg total) by mouth 3 (three) times daily as needed for dizziness. 30 tablet 0   methocarbamol (ROBAXIN) 500 MG tablet TAKE 1 TABLET TWICE DAILY AS NEEDED FOR MUSCLE SPASM(S) 180 tablet 0   ondansetron (ZOFRAN) 8 MG tablet Take 1 tablet (8 mg total) by mouth 2 (two) times daily as needed (Nausea or vomiting). 30 tablet 1   polyethylene glycol powder (GLYCOLAX/MIRALAX) 17 GM/SCOOP powder Take 17 g by mouth daily. 3350 g 1   potassium chloride SA (KLOR-CON M20) 20 MEQ tablet Take 1 tablet (20 mEq total) by mouth daily. 30 tablet 1   prochlorperazine (COMPAZINE) 10 MG tablet Take 1 tablet (10 mg total) by mouth every 6 (six) hours as needed (Nausea or vomiting). 30 tablet 1   triamterene-hydrochlorothiazide (MAXZIDE-25) 37.5-25 MG tablet TAKE 1 TABLET BY MOUTH EVERY DAY 90  tablet 0   vitamin E 180 MG (400 UNITS) capsule Take 400 Units by mouth daily.     No current facility-administered medications for this visit.   Facility-Administered Medications Ordered in Other Visits  Medication Dose Route Frequency Provider Last Rate Last Admin   heparin lock flush 100 UNIT/ML injection            palonosetron (ALOXI) 0.25 MG/5ML injection            prochlorperazine (COMPAZINE) 10 MG tablet             VITAL SIGNS: BP 111/66   Pulse 78  Temp (!) 96.2 F (35.7 C) (Tympanic)   Resp 16   Wt 154 lb (69.9 kg)   SpO2 100%   BMI 33.33 kg/m  Filed Weights   05/20/22 1300  Weight: 154 lb (69.9 kg)    Estimated body mass index is 33.33 kg/m as calculated from the following:   Height as of 10/28/21: 4' 9" (1.448 m).   Weight as of this encounter: 154 lb (69.9 kg).  LABS: CBC:    Component Value Date/Time   WBC 5.0 05/13/2022 0837   HGB 11.4 (L) 05/13/2022 0837   HCT 36.1 05/13/2022 0837   PLT 126 (L) 05/13/2022 0837   MCV 102.0 (H) 05/13/2022 0837   NEUTROABS 2.9 05/13/2022 0837   LYMPHSABS 1.5 05/13/2022 0837   MONOABS 0.5 05/13/2022 0837   EOSABS 0.1 05/13/2022 0837   BASOSABS 0.0 05/13/2022 0837   Comprehensive Metabolic Panel:    Component Value Date/Time   NA 140 05/13/2022 0837   K 3.7 05/13/2022 0837   CL 103 05/13/2022 0837   CO2 30 05/13/2022 0837   BUN 12 05/13/2022 0837   CREATININE 0.83 05/13/2022 0837   CREATININE 0.87 08/13/2020 1129   GLUCOSE 112 (H) 05/13/2022 0837   CALCIUM 9.6 05/13/2022 0837   AST 25 05/13/2022 0837   ALT 16 05/13/2022 0837   ALKPHOS 90 05/13/2022 0837   BILITOT 0.3 05/13/2022 0837   PROT 7.5 05/13/2022 0837   ALBUMIN 3.5 05/13/2022 0837    RADIOGRAPHIC STUDIES: No results found.  PERFORMANCE STATUS (ECOG) : 2 - Symptomatic, <50% confined to bed  Review of Systems Unless otherwise noted, a complete review of systems is negative.  Physical Exam General: NAD Cardiovascular: regular rate and  rhythm Pulmonary: clear ant fields Abdomen: soft, nontender, + bowel sounds GU: no suprapubic tenderness Extremities: no edema, no joint deformities Skin: no rashes Neurological: Weakness but otherwise nonfocal  IMPRESSION/PLAN: Nausea/vomiting -symptoms improved today.  Exam benign.  We will proceed with IV fluids.  Recommend continued antiemetics and discussed liberalizing regimen if needed.  Hypokalemia -check serum mag.  Replete with KCl 20 mEq IV x 1.  Patient is on oral K-Dur but states that she did not take that today.  We will have her increase to twice daily dosing until she is seen next week.  RTC next week as previously scheduled.  We can see patient sooner if needed.  Patient expressed understanding and was in agreement with this plan. She also understands that She can call clinic at any time with any questions, concerns, or complaints.   Thank you for allowing me to participate in the care of this very pleasant patient.   Time Total: 15 minutes  Visit consisted of counseling and education dealing with the complex and emotionally intense issues of symptom management in the setting of serious illness.Greater than 50%  of this time was spent counseling and coordinating care related to the above assessment and plan.  Signed by: Altha Harm, PhD, NP-C

## 2022-05-25 ENCOUNTER — Encounter: Payer: Self-pay | Admitting: Oncology

## 2022-05-25 NOTE — Addendum Note (Signed)
Addended by: [name] on: 05/25/2022 05:06 PM   Modules accepted: Orders

## 2022-05-27 ENCOUNTER — Inpatient Hospital Stay: Payer: Medicare HMO

## 2022-05-27 ENCOUNTER — Encounter: Payer: Self-pay | Admitting: Oncology

## 2022-05-27 ENCOUNTER — Inpatient Hospital Stay (HOSPITAL_BASED_OUTPATIENT_CLINIC_OR_DEPARTMENT_OTHER): Payer: Medicare HMO | Admitting: Oncology

## 2022-05-27 VITALS — BP 126/77 | HR 80 | Resp 18 | Ht <= 58 in | Wt 157.0 lb

## 2022-05-27 DIAGNOSIS — C189 Malignant neoplasm of colon, unspecified: Secondary | ICD-10-CM

## 2022-05-27 DIAGNOSIS — Z5111 Encounter for antineoplastic chemotherapy: Secondary | ICD-10-CM

## 2022-05-27 DIAGNOSIS — Z5189 Encounter for other specified aftercare: Secondary | ICD-10-CM | POA: Diagnosis not present

## 2022-05-27 DIAGNOSIS — C787 Secondary malignant neoplasm of liver and intrahepatic bile duct: Secondary | ICD-10-CM

## 2022-05-27 DIAGNOSIS — G629 Polyneuropathy, unspecified: Secondary | ICD-10-CM

## 2022-05-27 DIAGNOSIS — Z79899 Other long term (current) drug therapy: Secondary | ICD-10-CM | POA: Diagnosis not present

## 2022-05-27 DIAGNOSIS — D701 Agranulocytosis secondary to cancer chemotherapy: Secondary | ICD-10-CM | POA: Diagnosis not present

## 2022-05-27 DIAGNOSIS — Z5112 Encounter for antineoplastic immunotherapy: Secondary | ICD-10-CM | POA: Diagnosis not present

## 2022-05-27 DIAGNOSIS — T451X5A Adverse effect of antineoplastic and immunosuppressive drugs, initial encounter: Secondary | ICD-10-CM

## 2022-05-27 LAB — CBC WITH DIFFERENTIAL/PLATELET
Abs Immature Granulocytes: 0.15 10*3/uL — ABNORMAL HIGH (ref 0.00–0.07)
Basophils Absolute: 0 10*3/uL (ref 0.0–0.1)
Basophils Relative: 0 %
Eosinophils Absolute: 0.1 10*3/uL (ref 0.0–0.5)
Eosinophils Relative: 1 %
HCT: 35.3 % — ABNORMAL LOW (ref 36.0–46.0)
Hemoglobin: 11 g/dL — ABNORMAL LOW (ref 12.0–15.0)
Immature Granulocytes: 2 %
Lymphocytes Relative: 19 %
Lymphs Abs: 1.9 10*3/uL (ref 0.7–4.0)
MCH: 32.2 pg (ref 26.0–34.0)
MCHC: 31.2 g/dL (ref 30.0–36.0)
MCV: 103.2 fL — ABNORMAL HIGH (ref 80.0–100.0)
Monocytes Absolute: 1.1 10*3/uL — ABNORMAL HIGH (ref 0.1–1.0)
Monocytes Relative: 11 %
Neutro Abs: 6.9 10*3/uL (ref 1.7–7.7)
Neutrophils Relative %: 67 %
Platelets: 142 10*3/uL — ABNORMAL LOW (ref 150–400)
RBC: 3.42 MIL/uL — ABNORMAL LOW (ref 3.87–5.11)
RDW: 15.7 % — ABNORMAL HIGH (ref 11.5–15.5)
WBC: 10.1 10*3/uL (ref 4.0–10.5)
nRBC: 0.2 % (ref 0.0–0.2)

## 2022-05-27 LAB — COMPREHENSIVE METABOLIC PANEL
ALT: 20 U/L (ref 0–44)
AST: 32 U/L (ref 15–41)
Albumin: 3.5 g/dL (ref 3.5–5.0)
Alkaline Phosphatase: 105 U/L (ref 38–126)
Anion gap: 6 (ref 5–15)
BUN: 10 mg/dL (ref 8–23)
CO2: 27 mmol/L (ref 22–32)
Calcium: 9.3 mg/dL (ref 8.9–10.3)
Chloride: 104 mmol/L (ref 98–111)
Creatinine, Ser: 0.8 mg/dL (ref 0.44–1.00)
GFR, Estimated: >60 mL/min (ref >60)
Glucose, Bld: 124 mg/dL — ABNORMAL HIGH (ref 70–99)
Potassium: 3.6 mmol/L (ref 3.5–5.1)
Sodium: 137 mmol/L (ref 135–145)
Total Bilirubin: 0.4 mg/dL (ref 0.3–1.2)
Total Protein: 7.5 g/dL (ref 6.5–8.1)

## 2022-05-27 LAB — PROTEIN, URINE, RANDOM: Total Protein, Urine: 8 mg/dL

## 2022-05-27 MED ORDER — PALONOSETRON HCL INJECTION 0.25 MG/5ML
0.2500 mg | Freq: Once | INTRAVENOUS | Status: AC
  Administered 2022-05-27: 0.25 mg via INTRAVENOUS
  Filled 2022-05-27: qty 5

## 2022-05-27 MED ORDER — PROCHLORPERAZINE MALEATE 10 MG PO TABS
10.0000 mg | ORAL_TABLET | Freq: Once | ORAL | Status: AC
  Administered 2022-05-27: 10 mg via ORAL
  Filled 2022-05-27: qty 1

## 2022-05-27 MED ORDER — SODIUM CHLORIDE 0.9 % IV SOLN
Freq: Once | INTRAVENOUS | Status: AC
  Filled 2022-05-27: qty 250

## 2022-05-27 MED ORDER — SODIUM CHLORIDE 0.9 % IV SOLN: 400.0000 mg/m2 | Freq: Once | INTRAVENOUS | Status: DC

## 2022-05-27 MED ORDER — FLUOROURACIL CHEMO INJECTION 2.5 GM/50ML
400.0000 mg/m2 | Freq: Once | INTRAVENOUS | Status: AC
  Administered 2022-05-27: 700 mg via INTRAVENOUS
  Filled 2022-05-27: qty 14

## 2022-05-27 MED ORDER — HEPARIN SOD (PORK) LOCK FLUSH 100 UNIT/ML IV SOLN
500.0000 [IU] | Freq: Once | INTRAVENOUS | Status: DC | PRN
  Filled 2022-05-27: qty 5

## 2022-05-27 MED ORDER — DEXTROSE 5 % IV SOLN
Freq: Once | INTRAVENOUS | Status: AC
  Filled 2022-05-27: qty 250

## 2022-05-27 MED ORDER — SODIUM CHLORIDE 0.9 % IV SOLN
5.0000 mg/kg | Freq: Once | INTRAVENOUS | Status: AC
  Administered 2022-05-27: 400 mg via INTRAVENOUS
  Filled 2022-05-27: qty 16

## 2022-05-27 MED ORDER — OXALIPLATIN CHEMO INJECTION 100 MG/20ML
65.0000 mg/m2 | Freq: Once | INTRAVENOUS | Status: AC
  Administered 2022-05-27: 115 mg via INTRAVENOUS
  Filled 2022-05-27: qty 10

## 2022-05-27 MED ORDER — LEUCOVORIN CALCIUM INJECTION 350 MG
700.0000 mg | Freq: Once | INTRAVENOUS | Status: AC
  Administered 2022-05-27: 700 mg via INTRAVENOUS
  Filled 2022-05-27: qty 35

## 2022-05-27 MED ORDER — SODIUM CHLORIDE 0.9 % IV SOLN
2400.0000 mg/m2 | INTRAVENOUS | Status: DC
  Administered 2022-05-27: 4300 mg via INTRAVENOUS
  Filled 2022-05-27: qty 86

## 2022-05-27 NOTE — Progress Notes (Signed)
Patient did not want to answer my questions so that I could update her chart. She states that everything was done with Josh Borders at her last visit and everything is the same.

## 2022-05-27 NOTE — Assessment & Plan Note (Signed)
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 Bilateral fingertips, right worse than left Continue gabapentin 300 mg twice daily.

## 2022-05-27 NOTE — Progress Notes (Signed)
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  Patient receiving folfox and bevacizumab.  Met with patient during infusion.  Says that her appetite has picked back up in the last few days.  Noted was seen in [facility] for nausea, weakness.  Says that she is drinking the boost plus 2 a day.      Medications: reviewed  Labs: reviewed  Anthropometrics:   Weight 157 lb  161 lb on 8/9 171 lb on 6/7 178 lb on 4/19 174 lb on 3/22 178 lb on 1/11  NUTRITION DIAGNOSIS: Inadequate oral intake continues   INTERVENTION:  Encouraged continuing to eat mini meals q 1-2 hours Continue 350 calorie shake Message sent to NP that patient requests call.      MONITORING, EVALUATION, GOAL: weight trends, intake   NEXT VISIT: Wednesday, Sept 20 during infusion  Joli B. Allen, RD, LDN Registered Dietitian [phone]   

## 2022-05-27 NOTE — Progress Notes (Signed)
Hematology/Oncology Progress note [phone] [phone]         Patient Care Team: Jearld Fenton, NP as PCP - General (Internal Medicine) Clent Jacks, RN as Oncology Nurse Navigator [name], MD as Consulting Physician (Oncology)  ASSESSMENT & PLAN:   Cancer Staging  Metastatic colon cancer to liver [facility]) Staging form: Colon and Rectum, AJCC 8th Edition - Clinical stage from 09/04/2021: Stage Unknown (cTX, cNX, cM1) - Signed by [name], MD on 05/13/2022   Metastatic colon cancer to liver [facility]) #Metastatic colon cancer to liver, 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  No sufficient tissue for NGS Liquid biopsy showed KRAS12V, APC, TP53 mutation, TMB 4.  S/p 6 cycles of 5-FU /Bevacizumab--> CT showed mixed response--> FOLFOX/bevacizumab.-->CT imaging shows stable disease.  Labs reviewed and discussed with patient Proceed with chemotherapy chemotherapy FOLFOX bevacizumab today-see below.   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  Stable disease.  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 Bilateral fingertips, right worse than left Continue gabapentin 300 mg twice daily.  Encounter for antineoplastic chemotherapy Chemotherapy plan as listed above  Chemotherapy induced neutropenia ([place]) patient gets prophylactic G-CSF with Udenyca on day 3.  Claritin 10 mg daily for 4 days.    No orders of the defined types were placed in this encounter.  I spent time and answered all patient's questions regarding why resuming chemotherapy is recommended.  All questions were answered to her satisfaction.  She agrees with proceeding with treatment today.   Follow-up in 2 weeks lab [name]. All questions were answered. The patient knows to call the clinic with any problems, questions or concerns.  [name], MD, PhD [facility] Hematology  Oncology 05/27/2022   CHIEF COMPLAINTS/REASON FOR VISIT:  metastatic colon cancer  HISTORY OF PRESENTING ILLNESS:   Chelsea [name] is a  76 y.o.  female presents for management of metastatic colon cancer. Oncology History  Metastatic colon cancer to liver ([place])  08/21/2021 Imaging   CT abdomen pelvis with contrast  showed soft tissue mass at the base of cecum measuring up to 5 cm, slightly increased in size.  Interval development of multiple low-density lesions throughout the liver highly suggestive for metastatic disease.  Complex mass in the region of the vaginal cuff measuring up to 5.2 cm, increased in size.  Right ovary also appears more prominent on the current examination.  Moderate large volume of stool throughout the colon.  Mild urinary bladder wall thickening.  Colonic diverticulosis without evidence of active diverticulitis.  Aortic atherosclerosis   09/04/2021 Procedure   patient is status post left lobe liver lesion biopsy and pathology showed metastatic adenocarcinoma, compatible with colorectal primary.   09/04/2021 Cancer Staging   Staging form: Colon and Rectum, AJCC 8th Edition - Clinical stage from 09/04/2021: Stage Unknown (cTX, cNX, cM1) - Signed by [name], MD on 05/13/2022 Stage prefix: Initial diagnosis   09/16/2021 Initial Diagnosis   Metastatic colon cancer to liver [facility]) [facility] one liquid biopsy testing showed KRASG12V, APC TP53 TMB 4  Patient initially went to emergency room on 03/24/2021 for abdominal pain. 03/24/2021, CT scan of the abdomen showed marked bladder wall thickening with surrounding soft tissue stranding is noted.  Concerning for cystitis.  There is a lobulated mass between the posterior wall of the bladder and the rectum which appears to arise from the vaginal cuff.  This is indeterminate and difficult to characterize reflecting lack  of IV contrast material.  Nonobstructing left renal calculus, left sacroiliitis, lumbar spondylosis aortic  atherosclerosis. 03/24/2021 CT pelvic showed abdominal [place] soft tissue of right cecum, concerning for colon carcinoma.  Colonoscopy recommended.  Low-attenuation mass in the region of vaginal cuff is again noted.  Diffuse urinary bladder wall thickening and mild bilateral hydroureter possibly cystitis.   [place] 03/24/2021 Lobulated solid 3.8 cm mass confirmed by ultrasound. This is most compatible with a neoplasm but remains indeterminate as to the origin as the epicenter seems to be outside of both the vaginal cuff and the adjacent colon, while the lesion appears inseparable from both. Extensive echogenic debris within the distended urinary bladder.   03/26/2021, patient was seen by Dr. Theora Gianotti for evaluation of colonic/vaginal cuff pelvic mass.  Patient also was referred to gastroenterology for colonoscopy.   03/26/2021 CEA 3.4.  CA125 9.7 04/04/2021, status post colonoscopy which showed a frond like /villous nonobstructing large mass was found in the cecum, this was biopsied.  Terminal ileum was briefly intubated and appeared normal.  Diverticulosis in the sigmoid colon.  The rectum, sigmoid colon, descending colon, transverse colon and ascending colon were normal.  Nonbleeding internal hemorrhoids. 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 Given that the biopsy may not be representative of the entire underlying lesion.  Patient was recommended to establish with surgeon for evaluation. Patient no showed for her surgery appointment and as well as her follow-up appointment with gastroenterology.  Per daughter, patient was having cervical spine surgery and she prioritized that over the colon surgery.   10/20/2021 - 01/07/2022 Chemotherapy    COLORECTAL 5-FU  + Bevacizumab q14d      10/20/2021 -  Chemotherapy   Patient is on Treatment Plan : COLORECTAL 5FU + Leucovorin (Modified [place]) + Bevacizumab q14d     12/22/2021 Imaging   CT chest abdomen pelvis Decreased cecum mass,  however increased size and size of metastatic lesions throughout the liver.  Interval enlargement of vaginal cuff mass. No mets in chest.    01/21/2022 -  Chemotherapy   COLORECTAL FOLFOX  + Bevacizumab q14d    patient was seen by gynecology oncology Dr. Fransisca Connors.  Biopsy of the vaginal mass was not recommended.Patient's case was also discussed on multidisciplinary tumor board.  IR potentially can try a biopsy if patient is willing to.I had a discussion with patient's daughter over the phone prior to this visit.  We discussed about option of adding oxaliplatin to 5-FU/bevacizumab and continue treatment of colon cancer, repeat CT scan short-term and if progression, repeat biopsy versus proceeding with vaginal mass biopsy.  Daughter prefers proceeding with chemotherapy and defer biopsy.   01/08/2022 patient has had a second opinion at [facility] and was seen by Dr. Reynaldo Minium .  Dr. Reynaldo Minium agrees with the current treatment plan with FOLFOX/bevacizumab.  He has ordered guardant 360 to see if she may be eligible for clinical trial in the future.  -   04/13/2022 Imaging    CT chest abdomen pelvis showed a cecal mass with stable hepatic metastasis.  Mixed cystic and solid mass in the region of the vaginal cuff, new area of patchy groundglass and consolidation in the right upper lobe, lingula and left lower lobe.  Likely due to pneumonia.  Hepatic steatosis.  Bilateral renal stones.  Aortic atherosclerosis      INTERVAL HISTORY Chelsea [name] is a 76 y.o. female who has above history reviewed by me today presents for follow up visit for management of metastatic colon  cancer Patient was accompanied by her daughter Patient reports feeling well today.   because of No nausea vomiting diarrhea.    Review of Systems  Constitutional:  Positive for fatigue. Negative for appetite change, chills, fever and unexpected weight change.  HENT:   Negative for hearing loss and voice change.   Eyes:  Negative for  eye problems.  Respiratory:  Negative for chest tightness and cough.   Cardiovascular:  Negative for chest pain.  Gastrointestinal:  Negative for abdominal distention, abdominal pain, blood in stool and diarrhea.  Endocrine: Negative for hot flashes.  Genitourinary:  Negative for difficulty urinating and frequency.   Musculoskeletal:  Positive for back pain. Negative for arthralgias.  Skin:  Negative for itching and rash.  Neurological:  Positive for numbness (chornic finger tips). Negative for extremity weakness.  Hematological:  Negative for adenopathy.  Psychiatric/Behavioral:  Negative for confusion.     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)    Moderate mitral insufficiency    Port-A-Cath in place    Seizures [facility])    Spinal stenosis    Ulcer    Urinary incontinence     SURGICAL HISTORY: Past Surgical History:  Procedure Laterality Date   ABDOMINAL HYSTERECTOMY     BACK SURGERY     due to polio   BREAST BIOPSY Bilateral    neg   BREAST BIOPSY Right 2011   neg/stereo   CARPAL TUNNEL RELEASE     CATARACT EXTRACTION Right 2020   COLONOSCOPY WITH PROPOFOL N/A 04/04/2021   Procedure: COLONOSCOPY WITH PROPOFOL;  Surgeon: Virgel Manifold, MD;  Location: [facility] ENDOSCOPY;  Service: Endoscopy;  Laterality: N/A;   EYE SURGERY     IR IMAGING GUIDED PORT INSERTION  10/09/2021    SOCIAL HISTORY: Social History   Socioeconomic History   Marital status: Divorced    Spouse name: Not on file   Number of children: Not on file   Years of education: Not on file   Highest education level: Not on file  Occupational History   Not on file  Tobacco Use   Smoking status: Never   Smokeless tobacco: Never  Vaping Use   Vaping Use: Never used  Substance and Sexual Activity   Alcohol use: Never   Drug use: No   Sexual activity: Not  Currently  Other Topics Concern   Not on file  Social History Narrative   Not on file   Social Determinants of Health   Financial Resource Strain: Low Risk  (01/12/2022)   Overall Financial Resource Strain (CARDIA)    Difficulty of Paying Living Expenses: Not very hard  Food Insecurity: No Food Insecurity (01/12/2022)   Hunger Vital Sign    Worried About Running Out of Food in the Last Year: Never true    Ran Out of Food in the Last Year: Never true  Transportation Needs: No Transportation Needs (01/12/2022)   PRAPARE - Hydrologist (Medical): No    Lack of Transportation (Non-Medical): No  Physical Activity: Inactive (01/12/2022)   Exercise Vital Sign    Days of Exercise per Week: 0 days    Minutes of Exercise per Session: 0 min  Stress: Stress Concern Present (01/12/2022)   [place]  Feeling of Stress : To some extent  Social Connections: Moderately Integrated (01/12/2022)   Social Connection and Isolation Panel [NHANES]    Frequency of Communication with Friends and Family: Three times a week    Frequency of Social Gatherings with Friends and Family: Three times a week    Attends Religious Services: 1 to 4 times per year    Active Member of Clubs or Organizations: No    Attends Archivist Meetings: 1 to 4 times per year    Marital Status: Widowed  Intimate Partner Violence: Not At Risk (01/12/2022)   Humiliation, Afraid, Rape, and Kick questionnaire    Fear of Current or Ex-Partner: No    Emotionally Abused: No    Physically Abused: No    Sexually Abused: No    FAMILY HISTORY: Family History  Problem Relation Age of Onset   Arthritis Mother    Heart disease Mother    Stroke Mother    Hypertension Mother    COPD Mother    Sudden death Sister    Other Father        unknown medical history   Breast cancer Neg Hx     ALLERGIES:  is allergic to  penicillins.  MEDICATIONS:  Current Outpatient Medications  Medication Sig Dispense Refill   acetaminophen (TYLENOL) 500 MG tablet Take 500 mg by mouth every 6 (six) hours as needed.     ALPRAZolam (XANAX) 0.5 MG tablet TAKE 1 TABLET BY MOUTH TWICE A DAY AS NEEDED FOR ANXIETY 60 tablet 0   aspirin 325 MG tablet Take 325 mg by mouth daily.     atorvastatin (LIPITOR) 10 MG tablet TAKE 1 TABLET BY MOUTH EVERY DAY 90 tablet 0   busPIRone (BUSPAR) 5 MG tablet Take 1 tablet (5 mg total) by mouth 3 (three) times daily. 270 tablet 1   Cyanocobalamin (VITAMIN B 12 PO) Take 500 mcg by mouth daily.     diclofenac Sodium (VOLTAREN) 1 % GEL Apply 4 g topically 4 (four) times daily. 4 g 0   docusate sodium (COLACE) 100 MG capsule Take 1 capsule (100 mg total) by mouth 2 (two) times daily. 60 capsule 0   FLUoxetine (PROZAC) 20 MG capsule TAKE 3 CAPSULES (60 MG TOTAL) BY MOUTH EVERY MORNING. 270 capsule 0   gabapentin (NEURONTIN) 100 MG capsule Take 200 mg by mouth 2 (two) times daily.     gabapentin (NEURONTIN) 300 MG capsule Take 1 capsule (300 mg total) by mouth 2 (two) times daily. 180 capsule 1   lidocaine (LIDODERM) 5 % Place 1 patch onto the skin daily. Remove & Discard patch within 12 hours or as directed by MD 1 patch 0   lidocaine-prilocaine (EMLA) cream Apply small amount of cream to port site 1-2 hour prior to chemo treatment. 30 g 3   loperamide (IMODIUM) 2 MG capsule TAKE 1 CAP BY MOUTH SEE ADMIN INSTRUCTIONS. INITIAL: 4 MG, FOLLOWED BY 2 MG AFTER EACH LOOSE STOOL MAXIMUM: 16 MG/DAY 60 capsule 0   loratadine (CLARITIN) 10 MG tablet Take 1 tablet (10 mg total) by mouth See admin instructions. Take 1 tablet daily for 4 days after each chemotherapy treatments. 90 tablet 0   meclizine (ANTIVERT) 12.5 MG tablet Take 1 tablet (12.5 mg total) by mouth 3 (three) times daily as needed for dizziness. 30 tablet 0   methocarbamol (ROBAXIN) 500 MG tablet TAKE 1 TABLET TWICE DAILY AS NEEDED FOR MUSCLE SPASM(S)  180 tablet 0   ondansetron (ZOFRAN) 8  MG tablet Take 1 tablet (8 mg total) by mouth 2 (two) times daily as needed (Nausea or vomiting). 30 tablet 1   polyethylene glycol powder (GLYCOLAX/MIRALAX) 17 GM/SCOOP powder Take 17 g by mouth daily. 3350 g 1   potassium chloride SA (KLOR-CON M20) 20 MEQ tablet Take 1 tablet (20 mEq total) by mouth daily. 30 tablet 1   prochlorperazine (COMPAZINE) 10 MG tablet Take 1 tablet (10 mg total) by mouth every 6 (six) hours as needed (Nausea or vomiting). 30 tablet 1   triamterene-hydrochlorothiazide (MAXZIDE-25) 37.5-25 MG tablet TAKE 1 TABLET BY MOUTH EVERY DAY 90 tablet 0   vitamin E 180 MG (400 UNITS) capsule Take 400 Units by mouth daily.     No current facility-administered medications for this visit.   Facility-Administered Medications Ordered in Other Visits  Medication Dose Route Frequency Provider Last Rate Last Admin   heparin lock flush 100 UNIT/ML injection            palonosetron (ALOXI) 0.25 MG/5ML injection            prochlorperazine (COMPAZINE) 10 MG tablet              PHYSICAL EXAMINATION: ECOG PERFORMANCE STATUS: 2 - Symptomatic, <50% confined to bed Vitals:   05/27/22 0927  BP: 126/77  Pulse: 80  Resp: 18  SpO2: 100%    Filed Weights   05/27/22 0927  Weight: 157 lb (71.2 kg)      Physical Exam Constitutional:      General: She is not in acute distress.    Comments: Patient sits in wheelchair  HENT:     Head: Normocephalic and atraumatic.  Eyes:     General: No scleral icterus. Cardiovascular:     Rate and Rhythm: Normal rate and regular rhythm.     Heart sounds: Normal heart sounds.  Pulmonary:     Effort: Pulmonary effort is normal. No respiratory distress.     Breath sounds: No wheezing.  Abdominal:     General: Bowel sounds are normal. There is no distension.     Palpations: Abdomen is soft.  Musculoskeletal:        General: No deformity. Normal range of motion.     Cervical back: Normal range of motion  and neck supple.  Skin:    General: Skin is warm and dry.     Findings: No erythema or rash.  Neurological:     Mental Status: She is alert and oriented to person, place, and time. Mental status is at baseline.     Cranial Nerves: No cranial nerve deficit.     Coordination: Coordination normal.  Psychiatric:        Mood and Affect: Mood normal.     LABORATORY DATA:  I have reviewed the data as listed     Latest Ref Rng & Units 05/27/2022    9:11 AM 05/20/2022    1:30 PM 05/13/2022    8:37 AM  CBC  WBC 4.0 - 10.5 K/uL 10.1  10.8  5.0   Hemoglobin 12.0 - 15.0 g/dL 11.0  11.4  11.4   Hematocrit 36.0 - 46.0 % 35.3  35.9  36.1   Platelets 150 - 400 K/uL 142  132  126       Latest Ref Rng & Units 05/27/2022    9:11 AM 05/20/2022    1:30 PM 05/13/2022    8:37 AM  CMP  Glucose 70 - 99 mg/dL 124  121  112  BUN 8 - 23 mg/dL 10  19  12    Creatinine 0.44 - 1.00 mg/dL 0.80  0.92  0.83   Sodium 135 - 145 mmol/L 137  139  140   Potassium 3.5 - 5.1 mmol/L 3.6  3.1  3.7   Chloride 98 - 111 mmol/L 104  103  103   CO2 22 - 32 mmol/L 27  26  30    Calcium 8.9 - 10.3 mg/dL 9.3  9.2  9.6   Total Protein 6.5 - 8.1 g/dL 7.5  7.8  7.5   Total Bilirubin 0.3 - 1.2 mg/dL 0.4  1.0  0.3   Alkaline Phos 38 - 126 U/L 105  130  90   AST 15 - 41 U/L 32  29  25   ALT 0 - 44 U/L 20  18  16       RADIOGRAPHIC STUDIES: I have personally reviewed the radiological images as listed and agreed with the findings in the report. CT CHEST ABDOMEN PELVIS W CONTRAST  Result Date: 04/13/2022 CLINICAL DATA:  Colon mass, liver cancer diagnosed December 2022. * Tracking Code: BO * EXAM: CT CHEST, ABDOMEN, AND PELVIS WITH CONTRAST TECHNIQUE: Multidetector CT imaging of the chest, abdomen and pelvis was performed following the standard protocol during bolus administration of intravenous contrast. RADIATION DOSE REDUCTION: This exam was performed according to the departmental dose-optimization program which includes automated  exposure control, adjustment of the mA and/or kV according to patient size and/or use of iterative reconstruction technique. CONTRAST:  117m OMNIPAQUE IOHEXOL 300 MG/ML  SOLN COMPARISON:  12/22/2021. FINDINGS: CT CHEST FINDINGS Cardiovascular: Left IJ Port-A-Cath terminates in the right atrium. Atherosclerotic calcification of the aorta and aortic valve. Heart is enlarged. No pericardial effusion. Mediastinum/Nodes: No pathologically enlarged mediastinal, hilar or axillary lymph nodes. Calcified mediastinal and hilar lymph nodes. Esophagus is unremarkable. Lungs/Pleura: Calcified granulomas. Calcified pleural plaques in the right hemithorax. New areas of ground-glass and consolidation in the right upper lobe, lingula and left lower lobe. No pleural fluid. Airway is unremarkable. Musculoskeletal: T8 and T9 vertebral body fusion with a kyphotic deformity. Degenerative changes in the spine. No worrisome lytic or sclerotic lesions. CT ABDOMEN PELVIS FINDINGS Hepatobiliary: Liver is decreased in attenuation diffusely. Mildly hypoattenuating lesions in the liver measure up to 2.3 cm in the left hepatic lobe, unchanged. Gallbladder is mildly distended. No biliary ductal dilatation. Pancreas: Negative. Spleen: Negative. Adrenals/Urinary Tract: Adrenal glands are unremarkable. Scarring in the kidneys bilaterally. Bilateral renal stones. Ureters are decompressed. Left posterolateral bladder diverticulum. Bladder is otherwise grossly unremarkable. Stomach/Bowel: Stomach, small bowel and appendix are unremarkable. Eccentric irregular wall thickening in the cecum (2/68), similar. Stool is seen in the majority of the colon, indicative of constipation. Vascular/Lymphatic: Atherosclerotic calcification of the aorta. No pathologically enlarged lymph nodes. Gastrohepatic ligament lymph nodes are subcentimeter in short axis size. Reproductive: Hysterectomy. Heterogeneous mixed cystic and solid mass in the region of the vaginal cuff  measures 3.8 x 5.6 cm, stable from 12/22/2021. Other: No free fluid.  Mesenteries and peritoneum are unremarkable. Musculoskeletal: Degenerative changes in the spine. No worrisome lytic or sclerotic lesions. IMPRESSION: 1. 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 2. Mixed cystic and solid mass in the region of the vaginal cuff, worrisome for malignancy. 3. New areas of patchy ground-glass and consolidation in the right upper lobe, lingula and left lower lobe, likely due to pneumonia. Treatment later organizing pneumonia not excluded. 4. Hepatic steatosis. 5. Bilateral renal stones. 6.  Aortic atherosclerosis (ICD10-I70.0). Electronically Signed  By: Lorin Picket M.D.   On: 04/13/2022 16:26

## 2022-05-27 NOTE — Assessment & Plan Note (Signed)
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  No sufficient tissue for NGS Liquid biopsy showed KRAS12V, APC, TP53 mutation, TMB 4.  S/p 6 cycles of 5-FU /Bevacizumab--> CT showed mixed response--> FOLFOX/bevacizumab.-->CT imaging shows stable disease.  Labs reviewed and discussed with patient Proceed with chemotherapy chemotherapy FOLFOX bevacizumab today-see below.   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  Stable disease.

## 2022-05-27 NOTE — Patient Instructions (Signed)
Instrucciones al darle de alta: Discharge Instructions Gracias por elegir al [facility] [place] para [place] atencin mdica de oncologa y hematologa.  Si usted tiene cita de laboratorio con el [facility], por favor entre por la puerta principal y regstrese en el escritorio central de informacin.   Use ropa cmoda y adecuada para tener fcil acceso a las vas del Portacath (acceso venoso de larga duracin) o la lnea PICC (catter central colocado por va perifrica).   Nos esforzamos por ofrecerle tiempo de calidad con su proveedor. Es posible que tenga que volver a programar su cita si llega tarde (15 minutos o ms).  El llegar tarde le afecta a usted y a otros pacientes cuyas citas son posteriores a la suya.  Adems, si usted falta a tres o ms citas sin avisar a la oficina, puede ser retirado(a) de la clnica a discrecin del proveedor.      Para las solicitudes de renovacin de recetas, pida a su farmacia que se ponga en contacto con nuestra oficina y deje que transcurran 72 horas para que se complete el proceso de las renovaciones.    Hoy usted recibi los siguientes agentes de quimioterapia e/o inmunoterapia       Para ayudar a prevenir las nuseas y los vmitos despus de su tratamiento, le recomendamos que tome su medicamento para las nuseas segn las indicaciones.  LOS SNTOMAS QUE DEBEN COMUNICARSE INMEDIATAMENTE SE INDICAN A CONTINUACIN: *FIEBRE SUPERIOR A 100.4 F (38 C) O MS *ESCALOFROS O SUDORACIN *NUSEAS Y VMITOS QUE NO SE CONTROLAN CON EL MEDICAMENTO PARA LAS NUSEAS *DIFICULTAD INUSUAL PARA RESPIRAR  *MORETONES O HEMORRAGIAS NO HABITUALES *PROBLEMAS URINARIOS (dolor o ardor al orinar o frecuencia para orinar) *PROBLEMAS INTESTINALES (diarrea inusual, estreimiento, dolor cerca del ano) SENSIBILIDAD EN LA BOCA Y EN LA GARGANTA CON O SIN LA PRESENCIA DE LCERAS (dolor de garganta, llagas en la boca o dolor de muelas/dientes) ERUPCIN, HINCHAZN  O DOLORES INUSUALES FLUJO VAGINAL INUSUAL O PICAZN/RASQUIA    Los puntos marcados con un asterisco ( *) indican una posible emergencia y debe hacer un seguimiento tan pronto como le sea posible o vaya al Departamento de Emergencias si se le presenta algn problema.  Por favor, muestre la TARJETA DE ADVERTENCIA DE QUIMIOTERAPIA O LA TARJETA DE ADVERTENCIA DE INMUNOTERAPIA al registrarse en el Departamento de Emergencias y a la enfermera de triaje.  Si tiene preguntas despus de su visita o necesita cancelar o volver a programar su cita, por favor pngase en contacto con [facility] [phone]  y siga las instrucciones. Las horas de oficina son de 8:00 a.m. a 4:30 p.m. de lunes a viernes. Por favor, tenga en cuenta que los mensajes de voz que se dejan despus de las 4:00 p.m. posiblemente no se devolvern hasta el siguiente da de trabajo.  Cerramos los fines de semana y los das festivos importantes. En todo momento tiene acceso a una enfermera para preguntas urgentes. Por favor, llame al nmero principal de la clnica [phone] y siga las instrucciones.   Para cualquier pregunta que no sea de carcter urgente, tambin puede ponerse en contacto con su proveedor utilizando MyChart. Ahora ofrecemos visitas electrnicas para cualquier persona mayor de 18 aos que solicite atencin mdica en lnea para los sntomas que no sean urgentes. Para ms detalles vaya a [email or web address].   Tambin puede bajar la aplicacin de MyChart! Vaya a la tienda de aplicaciones, busque "MyChart", abra la aplicacin, seleccione [place], e ingrese   con su nombre de usuario y la contrasea de MyChart.  Las mscaras son opcionales en los centros de cncer. Si desea que su equipo de cuidados mdicos use una mscara mientras le atienden, por favor hgaselo saber al personal. Puede tener una persona de apoyo que tenga por lo menos 16 aos para que le acompae a sus citas. 

## 2022-05-27 NOTE — Assessment & Plan Note (Signed)
Chemotherapy plan as listed above 

## 2022-05-27 NOTE — Assessment & Plan Note (Signed)
patient gets prophylactic G-CSF with Udenyca on day 3.  Claritin 10 mg daily for 4 days.   

## 2022-05-28 ENCOUNTER — Encounter: Payer: Self-pay | Admitting: Oncology

## 2022-05-28 NOTE — Addendum Note (Signed)
Addended by: [name] on: 05/28/2022 03:15 PM   Modules accepted: Orders

## 2022-05-29 ENCOUNTER — Inpatient Hospital Stay: Payer: Medicare HMO

## 2022-05-29 VITALS — BP 131/79 | HR 93 | Resp 18

## 2022-05-29 DIAGNOSIS — Z79899 Other long term (current) drug therapy: Secondary | ICD-10-CM | POA: Diagnosis not present

## 2022-05-29 DIAGNOSIS — C189 Malignant neoplasm of colon, unspecified: Secondary | ICD-10-CM

## 2022-05-29 DIAGNOSIS — Z5112 Encounter for antineoplastic immunotherapy: Secondary | ICD-10-CM | POA: Diagnosis not present

## 2022-05-29 DIAGNOSIS — Z5111 Encounter for antineoplastic chemotherapy: Secondary | ICD-10-CM | POA: Diagnosis not present

## 2022-05-29 DIAGNOSIS — C787 Secondary malignant neoplasm of liver and intrahepatic bile duct: Secondary | ICD-10-CM | POA: Diagnosis not present

## 2022-05-29 DIAGNOSIS — Z5189 Encounter for other specified aftercare: Secondary | ICD-10-CM | POA: Diagnosis not present

## 2022-05-29 MED ORDER — SODIUM CHLORIDE 0.9% FLUSH
10.0000 mL | INTRAVENOUS | Status: DC | PRN
  Administered 2022-05-29: 10 mL
  Filled 2022-05-29: qty 10

## 2022-05-29 MED ORDER — HEPARIN SOD (PORK) LOCK FLUSH 100 UNIT/ML IV SOLN
INTRAVENOUS | Status: AC
  Administered 2022-05-29: 500 [IU]
  Filled 2022-05-29: qty 5

## 2022-05-29 MED ORDER — HEPARIN SOD (PORK) LOCK FLUSH 100 UNIT/ML IV SOLN
500.0000 [IU] | Freq: Once | INTRAVENOUS | Status: DC | PRN
  Filled 2022-05-29: qty 5

## 2022-05-29 MED ORDER — PEGFILGRASTIM-CBQV 6 MG/0.6ML ~~LOC~~ SOSY
6.0000 mg | PREFILLED_SYRINGE | Freq: Once | SUBCUTANEOUS | Status: AC
  Administered 2022-05-29: 6 mg via SUBCUTANEOUS
  Filled 2022-05-29: qty 0.6

## 2022-06-01 ENCOUNTER — Telehealth: Payer: Self-pay | Admitting: Oncology

## 2022-06-01 NOTE — Telephone Encounter (Signed)
Patient called to see if her appointments on 9/6 could be moved out to 9/13. Patient is getting chemo that day. Please advise on rescheduling.   Thanks!

## 2022-06-01 NOTE — Telephone Encounter (Signed)
Ok to move to 9/13. Dr. Tasia Catchings has updated IS. Please call pt with new appt details.

## 2022-06-02 ENCOUNTER — Other Ambulatory Visit: Payer: Medicare HMO

## 2022-06-10 ENCOUNTER — Ambulatory Visit: Payer: Medicare HMO

## 2022-06-10 ENCOUNTER — Other Ambulatory Visit: Payer: Medicare HMO

## 2022-06-10 ENCOUNTER — Ambulatory Visit: Payer: Medicare HMO | Admitting: Oncology

## 2022-06-10 ENCOUNTER — Other Ambulatory Visit: Payer: Self-pay | Admitting: Internal Medicine

## 2022-06-11 NOTE — Telephone Encounter (Signed)
Requested Prescriptions  Pending Prescriptions Disp Refills  . potassium chloride SA (KLOR-CON M) 20 MEQ tablet [Pharmacy Med Name: POTASSIUM CHLORIDE ER 20 MEQ Tablet Extended Release] 90 tablet 0    Sig: TAKE 1 TABLET EVERY DAY     Endocrinology:  Minerals - Potassium Supplementation Passed - 06/10/2022  8:54 AM      Passed - K in normal range and within 360 days    Potassium  Date Value Ref Range Status  05/27/2022 3.6 3.5 - 5.1 mmol/L Final         Passed - Cr in normal range and within 360 days    Creat  Date Value Ref Range Status  08/13/2020 0.87 0.60 - 0.93 mg/dL Final    Comment:    For patients >91 years of age, the reference limit for Creatinine is approximately 13% higher for people identified as African-American. .    Creatinine, Ser  Date Value Ref Range Status  05/27/2022 0.80 0.44 - 1.00 mg/dL Final         Passed - Valid encounter within last 12 months    Recent Outpatient Visits          1 month ago Aortic atherosclerosis [facility])   [facility] [place], Coralie Keens, NP   3 months ago Right leg weakness   [facility] [place], Coralie Keens, NP   7 months ago Pure hypercholesterolemia   [place], Coralie Keens, NP   9 months ago 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 [facility] [place])   [facility], NP   11 months ago [facility] [place], Coralie Keens, [place]

## 2022-06-14 ENCOUNTER — Other Ambulatory Visit: Payer: Self-pay | Admitting: Internal Medicine

## 2022-06-14 DIAGNOSIS — F411 Generalized anxiety disorder: Secondary | ICD-10-CM

## 2022-06-15 ENCOUNTER — Other Ambulatory Visit: Payer: Self-pay | Admitting: Internal Medicine

## 2022-06-15 DIAGNOSIS — R600 Localized edema: Secondary | ICD-10-CM

## 2022-06-15 DIAGNOSIS — I10 Essential (primary) hypertension: Secondary | ICD-10-CM

## 2022-06-16 NOTE — Telephone Encounter (Signed)
Requested medications are due for refill today.  yes  Requested medications are on the active medications list.  yes  Last refill. 05/15/2022 #60 1 rf  Future visit scheduled.   no  Notes to clinic.  Refill not delegated.    Requested Prescriptions  Pending Prescriptions Disp Refills   ALPRAZolam (XANAX) 0.5 MG tablet [Pharmacy Med Name: ALPRAZOLAM 0.5 MG TABLET] 60 tablet 0    Sig: TAKE 1 TABLET BY MOUTH TWICE A DAY AS NEEDED FOR ANXIETY     Not Delegated - Psychiatry: Anxiolytics/Hypnotics 2 Failed - 06/14/2022 12:39 PM      Failed - This refill cannot be delegated      Failed - Urine Drug Screen completed in last 360 days      Passed - Patient is not pregnant      Passed - Valid encounter within last 6 months    Recent Outpatient Visits           1 month ago Aortic atherosclerosis [facility])   [facility] [place], Coralie Keens, NP   3 months ago Right leg weakness   [facility] 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

## 2022-06-16 NOTE — Telephone Encounter (Signed)
Requested Prescriptions  Pending Prescriptions Disp Refills  . triamterene-hydrochlorothiazide (MAXZIDE-25) 37.5-25 MG tablet [Pharmacy Med Name: TRIAMTERENE/HYDROCHLOROTHIAZIDE 37.5-25 MG Tablet] 90 tablet 0    Sig: TAKE 1 TABLET EVERY DAY     Cardiovascular: Diuretic Combos Passed - 06/15/2022 10:23 AM      Passed - K in normal range and within 180 days    Potassium  Date Value Ref Range Status  05/27/2022 3.6 3.5 - 5.1 mmol/L Final         Passed - Na in normal range and within 180 days    Sodium  Date Value Ref Range Status  05/27/2022 137 135 - 145 mmol/L Final         Passed - Cr in normal range and within 180 days    Creat  Date Value Ref Range Status  08/13/2020 0.87 0.60 - 0.93 mg/dL Final    Comment:    For patients >68 years of age, the reference limit for Creatinine is approximately 13% higher for people identified as African-American. .    Creatinine, Ser  Date Value Ref Range Status  05/27/2022 0.80 0.44 - 1.00 mg/dL Final         Passed - Last BP in normal range    BP Readings from Last 1 Encounters:  05/29/22 131/79         Passed - Valid encounter within last 6 months    Recent Outpatient Visits          1 month ago Aortic atherosclerosis [facility])   [facility] [place], Coralie Keens, NP   3 months ago Right leg weakness   [facility] [place], Coralie Keens, NP   7 months ago Pure hypercholesterolemia   [place], Coralie Keens, NP   9 months ago 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

## 2022-06-16 NOTE — Telephone Encounter (Signed)
Requested Prescriptions  Pending Prescriptions Disp Refills  . FLUoxetine (PROZAC) 20 MG capsule [Pharmacy Med Name: FLUOXETINE HCL 20 MG CAPSULE] 270 capsule 0    Sig: TAKE 3 CAPSULES (60 MG TOTAL) BY MOUTH EVERY MORNING.     Psychiatry:  Antidepressants - SSRI Passed - 06/15/2022  8:30 AM      Passed - Completed PHQ-2 or PHQ-9 in the last 360 days      Passed - Valid encounter within last 6 months    Recent Outpatient Visits          1 month ago Aortic atherosclerosis [facility])   [facility] [place], Coralie Keens, NP   3 months ago Right leg weakness   [facility] [place], Coralie Keens, NP   7 months ago Pure hypercholesterolemia   [place], Coralie Keens, NP   9 months ago 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 [facility])   [facility], NP   11 months ago [facility] [place], Coralie Keens, [place]

## 2022-06-17 ENCOUNTER — Inpatient Hospital Stay: Payer: Medicare HMO | Attending: Oncology | Admitting: Oncology

## 2022-06-17 ENCOUNTER — Encounter: Payer: Self-pay | Admitting: Oncology

## 2022-06-17 ENCOUNTER — Inpatient Hospital Stay: Payer: Medicare HMO

## 2022-06-17 VITALS — BP 125/71 | HR 56 | Temp 97.6°F | Resp 18 | Wt 153.2 lb

## 2022-06-17 DIAGNOSIS — G629 Polyneuropathy, unspecified: Secondary | ICD-10-CM | POA: Diagnosis not present

## 2022-06-17 DIAGNOSIS — Z111 Encounter for screening for respiratory tuberculosis: Secondary | ICD-10-CM | POA: Diagnosis not present

## 2022-06-17 DIAGNOSIS — T451X5A Adverse effect of antineoplastic and immunosuppressive drugs, initial encounter: Secondary | ICD-10-CM | POA: Insufficient documentation

## 2022-06-17 DIAGNOSIS — E876 Hypokalemia: Secondary | ICD-10-CM

## 2022-06-17 DIAGNOSIS — D701 Agranulocytosis secondary to cancer chemotherapy: Secondary | ICD-10-CM | POA: Insufficient documentation

## 2022-06-17 DIAGNOSIS — Z5112 Encounter for antineoplastic immunotherapy: Secondary | ICD-10-CM | POA: Insufficient documentation

## 2022-06-17 DIAGNOSIS — C189 Malignant neoplasm of colon, unspecified: Secondary | ICD-10-CM | POA: Insufficient documentation

## 2022-06-17 DIAGNOSIS — Z5111 Encounter for antineoplastic chemotherapy: Secondary | ICD-10-CM

## 2022-06-17 DIAGNOSIS — Z79899 Other long term (current) drug therapy: Secondary | ICD-10-CM | POA: Insufficient documentation

## 2022-06-17 DIAGNOSIS — C787 Secondary malignant neoplasm of liver and intrahepatic bile duct: Secondary | ICD-10-CM | POA: Diagnosis not present

## 2022-06-17 DIAGNOSIS — D6481 Anemia due to antineoplastic chemotherapy: Secondary | ICD-10-CM

## 2022-06-17 DIAGNOSIS — Z1509 Genetic susceptibility to other malignant neoplasm: Secondary | ICD-10-CM | POA: Diagnosis not present

## 2022-06-17 DIAGNOSIS — K521 Toxic gastroenteritis and colitis: Secondary | ICD-10-CM

## 2022-06-17 LAB — COMPREHENSIVE METABOLIC PANEL
ALT: 14 U/L (ref 0–44)
AST: 24 U/L (ref 15–41)
Albumin: 3.4 g/dL — ABNORMAL LOW (ref 3.5–5.0)
Alkaline Phosphatase: 88 U/L (ref 38–126)
Anion gap: 6 (ref 5–15)
BUN: 14 mg/dL (ref 8–23)
CO2: 30 mmol/L (ref 22–32)
Calcium: 9.1 mg/dL (ref 8.9–10.3)
Chloride: 105 mmol/L (ref 98–111)
Creatinine, Ser: 0.95 mg/dL (ref 0.44–1.00)
GFR, Estimated: >60 mL/min (ref >60)
Glucose, Bld: 98 mg/dL (ref 70–99)
Potassium: 3 mmol/L — ABNORMAL LOW (ref 3.5–5.1)
Sodium: 141 mmol/L (ref 135–145)
Total Bilirubin: 0.6 mg/dL (ref 0.3–1.2)
Total Protein: 7 g/dL (ref 6.5–8.1)

## 2022-06-17 LAB — CBC WITH DIFFERENTIAL/PLATELET
Abs Immature Granulocytes: 0.05 10*3/uL (ref 0.00–0.07)
Basophils Absolute: 0 10*3/uL (ref 0.0–0.1)
Basophils Relative: 1 %
Eosinophils Absolute: 0 10*3/uL (ref 0.0–0.5)
Eosinophils Relative: 0 %
HCT: 33.9 % — ABNORMAL LOW (ref 36.0–46.0)
Hemoglobin: 10.6 g/dL — ABNORMAL LOW (ref 12.0–15.0)
Immature Granulocytes: 1 %
Lymphocytes Relative: 24 %
Lymphs Abs: 2 10*3/uL (ref 0.7–4.0)
MCH: 31.9 pg (ref 26.0–34.0)
MCHC: 31.3 g/dL (ref 30.0–36.0)
MCV: 102.1 fL — ABNORMAL HIGH (ref 80.0–100.0)
Monocytes Absolute: 1.1 10*3/uL — ABNORMAL HIGH (ref 0.1–1.0)
Monocytes Relative: 13 %
Neutro Abs: 5.1 10*3/uL (ref 1.7–7.7)
Neutrophils Relative %: 61 %
Platelets: 251 10*3/uL (ref 150–400)
RBC: 3.32 MIL/uL — ABNORMAL LOW (ref 3.87–5.11)
RDW: 16.9 % — ABNORMAL HIGH (ref 11.5–15.5)
WBC: 8.2 10*3/uL (ref 4.0–10.5)
nRBC: 0 % (ref 0.0–0.2)

## 2022-06-17 LAB — PROTEIN, URINE, RANDOM: Total Protein, Urine: 22 mg/dL

## 2022-06-17 MED ORDER — PROCHLORPERAZINE MALEATE 10 MG PO TABS
10.0000 mg | ORAL_TABLET | Freq: Once | ORAL | Status: AC
  Administered 2022-06-17: 10 mg via ORAL
  Filled 2022-06-17: qty 1

## 2022-06-17 MED ORDER — ACETAMINOPHEN 325 MG PO TABS
650.0000 mg | ORAL_TABLET | Freq: Once | ORAL | Status: AC
  Administered 2022-06-17: 650 mg via ORAL
  Filled 2022-06-17: qty 2

## 2022-06-17 MED ORDER — SODIUM CHLORIDE 0.9 % IV SOLN
5.0000 mg/kg | Freq: Once | INTRAVENOUS | Status: AC
  Administered 2022-06-17: 400 mg via INTRAVENOUS
  Filled 2022-06-17: qty 16

## 2022-06-17 MED ORDER — FLUOROURACIL CHEMO INJECTION 2.5 GM/50ML
400.0000 mg/m2 | Freq: Once | INTRAVENOUS | Status: AC
  Administered 2022-06-17: 700 mg via INTRAVENOUS
  Filled 2022-06-17: qty 14

## 2022-06-17 MED ORDER — LEUCOVORIN CALCIUM INJECTION 350 MG
700.0000 mg | Freq: Once | INTRAVENOUS | Status: AC
  Administered 2022-06-17: 700 mg via INTRAVENOUS
  Filled 2022-06-17: qty 35

## 2022-06-17 MED ORDER — OXALIPLATIN CHEMO INJECTION 100 MG/20ML
65.0000 mg/m2 | Freq: Once | INTRAVENOUS | Status: AC
  Administered 2022-06-17: 115 mg via INTRAVENOUS
  Filled 2022-06-17: qty 20

## 2022-06-17 MED ORDER — SODIUM CHLORIDE 0.9 % IV SOLN
2400.0000 mg/m2 | INTRAVENOUS | Status: DC
  Administered 2022-06-17: 4300 mg via INTRAVENOUS
  Filled 2022-06-17: qty 86

## 2022-06-17 MED ORDER — DEXTROSE 5 % IV SOLN
INTRAVENOUS | Status: DC
  Filled 2022-06-17: qty 250

## 2022-06-17 MED ORDER — POTASSIUM CHLORIDE 20 MEQ/100ML IV SOLN
20.0000 meq | Freq: Once | INTRAVENOUS | Status: AC
  Administered 2022-06-17: 20 meq via INTRAVENOUS

## 2022-06-17 MED ORDER — PALONOSETRON HCL INJECTION 0.25 MG/5ML
0.2500 mg | Freq: Once | INTRAVENOUS | Status: AC
  Administered 2022-06-17: 0.25 mg via INTRAVENOUS
  Filled 2022-06-17: qty 5

## 2022-06-17 MED ORDER — SODIUM CHLORIDE 0.9 % IV SOLN
Freq: Once | INTRAVENOUS | Status: AC
  Filled 2022-06-17: qty 250

## 2022-06-17 NOTE — Assessment & Plan Note (Signed)
Continue potassium chloride 68mq daily.  IV potassium chloride 268m x 1 today

## 2022-06-17 NOTE — Assessment & Plan Note (Addendum)
Hemoglobin trended down.  Check iron panel, B12, folate, rule out other etiologies.

## 2022-06-17 NOTE — Assessment & Plan Note (Addendum)
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  No sufficient tissue for NGS Liquid biopsy showed KRAS12V, APC, TP53 mutation, TMB 4.  S/p 6 cycles of 5-FU /Bevacizumab--> CT showed mixed response--> FOLFOX/bevacizumab.-->CT imaging shows stable disease.  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  Stable disease.

## 2022-06-17 NOTE — Assessment & Plan Note (Signed)
patient gets prophylactic G-CSF with Udenyca on day 3.  Claritin 10 mg daily for 4 days.   

## 2022-06-17 NOTE — Progress Notes (Signed)
Hematology/Oncology Progress note [phone] [phone]         Patient Care Team: Jearld Fenton, NP as PCP - General (Internal Medicine) Clent Jacks, RN as Oncology Nurse Navigator [name], MD as Consulting Physician (Oncology)  ASSESSMENT & PLAN:   Cancer Staging  Metastatic colon cancer to liver [facility]) Staging form: Colon and Rectum, AJCC 8th Edition - Clinical stage from 09/04/2021: Stage Unknown (cTX, cNX, cM1) - Signed by [name], MD on 05/13/2022   Metastatic colon cancer to liver [facility]) #Metastatic colon cancer to liver, 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  No sufficient tissue for NGS Liquid biopsy showed KRAS12V, APC, TP53 mutation, TMB 4.  S/p 6 cycles of 5-FU /Bevacizumab--> CT showed mixed response--> FOLFOX/bevacizumab.-->CT imaging shows stable disease.  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  Stable disease.  Encounter for antineoplastic chemotherapy Chemotherapy plan as listed above  Chemotherapy induced neutropenia ([facility]) patient gets prophylactic G-CSF with Udenyca on day 3.  Claritin 10 mg daily for 4 days.    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  Bilateral fingertips, right worse than left Continue gabapentin 300 mg twice daily.  Hypokalemia Continue potassium chloride 56mq daily.  IV potassium chloride 275m x 1 today  Chemotherapy induced diarrhea Continue Imodium PRN as prescribed.  Anemia due to antineoplastic chemotherapy Hemoglobin trended down.  Check iron panel, B12, folate, rule out other etiologies.     No orders of the defined types were placed in this encounter.  I spent time and answered all patient's questions regarding why resuming chemotherapy is recommended.  All questions were answered to her satisfaction.    Follow-up in 2 weeks lab [name]. All questions were answered. The patient knows to call the clinic with any problems, questions or concerns.  ZhEarlie ServerMD, PhD [facility] 06/17/2022   CHIEF COMPLAINTS/REASON FOR VISIT:  metastatic colon cancer  HISTORY OF PRESENTING ILLNESS:   Chelsea GANEs a  [age].o.  female presents for management of metastatic colon cancer. Oncology History  [place]   CT abdomen pelvis with contrast  showed soft tissue mass at the base of cecum measuring up to 5 cm, slightly increased in size.  Interval development of multiple low-density lesions throughout the liver highly suggestive for metastatic disease.  Complex mass in the region of the vaginal cuff measuring up to 5.2 cm, increased in size.  Right ovary also appears more prominent on the current examination.  Moderate large volume of stool throughout the colon.  Mild urinary bladder wall thickening.  Colonic diverticulosis without evidence of active diverticulitis.  Aortic atherosclerosis   09/04/2021 Procedure   patient is status post left lobe liver lesion biopsy and pathology showed metastatic adenocarcinoma, compatible with colorectal primary.   09/04/2021 Cancer Staging   Staging form: Colon and Rectum, AJCC 8th Edition - Clinical stage from 09/04/2021: Stage Unknown (cTX, cNX, cM1) - Signed by YuEarlie ServerMD on 05/13/2022 Stage prefix: Initial diagnosis   09/16/2021 Initial Diagnosis   [facility] liquid biopsy testing showed KRASG12V, APC TP53 TMB 4  Patient initially went to emergency room on 03/24/2021 for abdominal pain. 03/24/2021, CT scan of the abdomen showed marked bladder wall thickening with surrounding soft tissue stranding is noted.  Concerning for cystitis.  There is a lobulated mass between the posterior wall of the bladder and the  rectum which appears to arise from the vaginal cuff.  This is indeterminate and difficult to characterize reflecting lack of IV contrast material.  Nonobstructing left renal calculus, left sacroiliitis, lumbar spondylosis aortic atherosclerosis. 03/24/2021 CT pelvic showed abdominal [place] soft tissue of right cecum, concerning for colon carcinoma.  Colonoscopy recommended.  Low-attenuation mass in the region of vaginal cuff is again noted.  Diffuse urinary bladder wall thickening and mild bilateral hydroureter possibly cystitis.   [place] 03/24/2021 Lobulated solid 3.8 cm mass confirmed by ultrasound. This is most compatible with a neoplasm but remains indeterminate as to the origin as the epicenter seems to be outside of both the vaginal cuff and the adjacent colon, while the lesion appears inseparable from both. Extensive echogenic debris within the distended urinary bladder.   03/26/2021, patient was seen by Dr. Theora Gianotti for evaluation of colonic/vaginal cuff pelvic mass.  Patient also was referred to gastroenterology for colonoscopy.   03/26/2021 CEA 3.4.  CA125 9.7 04/04/2021, status post colonoscopy which showed a frond like /villous nonobstructing large mass was found in the cecum, this was biopsied.  Terminal ileum was briefly intubated and appeared normal.  Diverticulosis in the sigmoid colon.  The rectum, sigmoid colon, descending colon, transverse colon and ascending colon were normal.  Nonbleeding internal hemorrhoids. 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 Given that the biopsy may not be representative of the entire underlying lesion.  Patient was recommended to establish with surgeon for evaluation. Patient no showed for her surgery appointment and as well as her follow-up appointment with gastroenterology.  Per daughter, patient was having cervical spine surgery and she prioritized that over the colon surgery.   10/20/2021 - 01/07/2022 Chemotherapy    COLORECTAL 5-FU   + Bevacizumab q14d      10/20/2021 -  Chemotherapy   Patient is on Treatment Plan : COLORECTAL 5FU + Leucovorin (Modified [place]) + Bevacizumab q14d     12/22/2021 Imaging   CT chest abdomen pelvis Decreased cecum mass, however increased size and size of metastatic lesions throughout the liver.  Interval enlargement of vaginal cuff mass. No mets in chest.    01/21/2022 -  Chemotherapy   COLORECTAL FOLFOX  + Bevacizumab q14d    patient was seen by gynecology oncology Dr. Fransisca Connors.  Biopsy of the vaginal mass was not recommended.Patient's case was also discussed on multidisciplinary tumor board.  IR potentially can try a biopsy if patient is willing to.I had a discussion with patient's daughter over the phone prior to this visit.  We discussed about option of adding oxaliplatin to 5-FU/bevacizumab and continue treatment of colon cancer, repeat CT scan short-term and if progression, repeat biopsy versus proceeding with vaginal mass biopsy.  Daughter prefers proceeding with chemotherapy and defer biopsy.   01/08/2022 patient has had a second opinion at [facility] and was seen by Dr. Reynaldo Minium .  Dr. Reynaldo Minium agrees with the current treatment plan with FOLFOX/bevacizumab.  He has ordered guardant 360 to see if she may be eligible for clinical trial in the future.  -   04/13/2022 Imaging    CT chest abdomen pelvis showed a cecal mass with stable hepatic metastasis.  Mixed cystic and solid mass in the region of the vaginal cuff, new area of patchy groundglass and consolidation in the right upper lobe, lingula and left lower lobe.  Likely due to pneumonia.  Hepatic steatosis.  Bilateral renal  stones.  Aortic atherosclerosis      INTERVAL HISTORY KEAH LAMBA is a 76 y.o. female who has above history reviewed by me today presents for follow up visit for management of metastatic colon cancer Patient was accompanied by caregiver Patient reports feeling well today. Chemotherapy induced diarrhea,  usually 1-2 days after chemotherapy, manageable by imodium..  No nausea vomiting    Review of Systems  Constitutional:  Positive for fatigue. Negative for appetite change, chills, fever and unexpected weight change.  HENT:   Negative for hearing loss and voice change.   Eyes:  Negative for eye problems.  Respiratory:  Negative for chest tightness and cough.   Cardiovascular:  Negative for chest pain.  Gastrointestinal:  Negative for abdominal distention, abdominal pain, blood in stool and diarrhea.  Endocrine: Negative for hot flashes.  Genitourinary:  Negative for difficulty urinating and frequency.   Musculoskeletal:  Positive for back pain. Negative for arthralgias.  Skin:  Negative for itching and rash.  Neurological:  Positive for numbness (chornic finger tips). Negative for extremity weakness.  Hematological:  Negative for adenopathy.  Psychiatric/Behavioral:  Negative for confusion.     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)    Moderate mitral insufficiency    Port-A-Cath in place    Seizures [facility])    Spinal stenosis    Ulcer    Urinary incontinence     SURGICAL HISTORY: Past Surgical History:  Procedure Laterality Date   ABDOMINAL HYSTERECTOMY     BACK SURGERY     due to polio   BREAST BIOPSY Bilateral    neg   BREAST BIOPSY Right 2011   neg/stereo   CARPAL TUNNEL RELEASE     CATARACT EXTRACTION Right 2020   COLONOSCOPY WITH PROPOFOL N/A 04/04/2021   Procedure: COLONOSCOPY WITH PROPOFOL;  Surgeon: Virgel Manifold, MD;  Location: [facility] ENDOSCOPY;  Service: Endoscopy;  Laterality: N/A;   EYE SURGERY     IR IMAGING GUIDED PORT INSERTION  10/09/2021    SOCIAL HISTORY: Social History   Socioeconomic History   Marital status: Divorced    Spouse name: Not on file   Number of children: Not on file   Years of  education: Not on file   Highest education level: Not on file  Occupational History   Not on file  Tobacco Use   Smoking status: Never   Smokeless tobacco: Never  Vaping Use   Vaping Use: Never used  Substance and Sexual Activity   Alcohol use: Never   Drug use: No   Sexual activity: Not Currently  Other Topics Concern   Not on file  Social History Narrative   Not on file   Social Determinants of Health   Financial Resource Strain: Low Risk  (01/12/2022)   Overall Financial Resource Strain (CARDIA)    Difficulty of Paying Living Expenses: Not very hard  Food Insecurity: No Food Insecurity (01/12/2022)   Hunger Vital Sign    Worried About Running Out of Food in the Last Year: Never true    Ran Out of Food in the Last Year: Never true  Transportation Needs: No Transportation Needs (01/12/2022)   PRAPARE - Hydrologist (Medical): No    Lack of Transportation (Non-Medical): No  Physical Activity: Inactive (  01/12/2022)   Exercise Vital Sign    Days of Exercise per Week: 0 days    Minutes of Exercise per Session: 0 min  Stress: Stress Concern Present (01/12/2022)   [place]    Feeling of Stress : To some extent  Social Connections: Moderately Integrated (01/12/2022)   Social Connection and Isolation Panel [NHANES]    Frequency of Communication with Friends and Family: Three times a week    Frequency of Social Gatherings with Friends and Family: Three times a week    Attends Religious Services: 1 to 4 times per year    Active Member of Clubs or Organizations: No    Attends Archivist Meetings: 1 to 4 times per year    Marital Status: Widowed  Intimate Partner Violence: Not At Risk (01/12/2022)   Humiliation, Afraid, Rape, and Kick questionnaire    Fear of Current or Ex-Partner: No    Emotionally Abused: No    Physically Abused: No    Sexually Abused: No    FAMILY  HISTORY: Family History  Problem Relation Age of Onset   Arthritis Mother    Heart disease Mother    Stroke Mother    Hypertension Mother    COPD Mother    Sudden death Sister    Other Father        unknown medical history   Breast cancer Neg Hx     ALLERGIES:  is allergic to penicillins.  MEDICATIONS:  Current Outpatient Medications  Medication Sig Dispense Refill   acetaminophen (TYLENOL) 500 MG tablet Take 500 mg by mouth every 6 (six) hours as needed.     ALPRAZolam (XANAX) 0.5 MG tablet TAKE 1 TABLET BY MOUTH TWICE A DAY AS NEEDED FOR ANXIETY 60 tablet 0   aspirin 325 MG tablet Take 325 mg by mouth daily.     atorvastatin (LIPITOR) 10 MG tablet TAKE 1 TABLET BY MOUTH EVERY DAY 90 tablet 0   busPIRone (BUSPAR) 5 MG tablet Take 1 tablet (5 mg total) by mouth 3 (three) times daily. 270 tablet 1   Cyanocobalamin (VITAMIN B 12 PO) Take 500 mcg by mouth daily.     diclofenac Sodium (VOLTAREN) 1 % GEL Apply 4 g topically 4 (four) times daily. 4 g 0   docusate sodium (COLACE) 100 MG capsule Take 1 capsule (100 mg total) by mouth 2 (two) times daily. 60 capsule 0   FLUoxetine (PROZAC) 20 MG capsule TAKE 3 CAPSULES (60 MG TOTAL) BY MOUTH EVERY MORNING. 270 capsule 0   gabapentin (NEURONTIN) 100 MG capsule Take 200 mg by mouth 2 (two) times daily.     gabapentin (NEURONTIN) 300 MG capsule Take 1 capsule (300 mg total) by mouth 2 (two) times daily. 180 capsule 1   lidocaine (LIDODERM) 5 % Place 1 patch onto the skin daily. Remove & Discard patch within 12 hours or as directed by MD 1 patch 0   lidocaine-prilocaine (EMLA) cream Apply small amount of cream to port site 1-2 hour prior to chemo treatment. 30 g 3   loperamide (IMODIUM) 2 MG capsule TAKE 1 CAP BY MOUTH SEE ADMIN INSTRUCTIONS. INITIAL: 4 MG, FOLLOWED BY 2 MG AFTER EACH LOOSE STOOL MAXIMUM: 16 MG/DAY 60 capsule 0   loratadine (CLARITIN) 10 MG tablet Take 1 tablet (10 mg total) by mouth See admin instructions. Take 1 tablet daily  for 4 days after each chemotherapy treatments. 90 tablet 0   meclizine (ANTIVERT) 12.5  MG tablet Take 1 tablet (12.5 mg total) by mouth 3 (three) times daily as needed for dizziness. 30 tablet 0   methocarbamol (ROBAXIN) 500 MG tablet TAKE 1 TABLET TWICE DAILY AS NEEDED FOR MUSCLE SPASM(S) 180 tablet 0   ondansetron (ZOFRAN) 8 MG tablet Take 1 tablet (8 mg total) by mouth 2 (two) times daily as needed (Nausea or vomiting). 30 tablet 1   polyethylene glycol powder (GLYCOLAX/MIRALAX) 17 GM/SCOOP powder Take 17 g by mouth daily. 3350 g 1   potassium chloride SA (KLOR-CON M) 20 MEQ tablet TAKE 1 TABLET EVERY DAY 90 tablet 0   prochlorperazine (COMPAZINE) 10 MG tablet Take 1 tablet (10 mg total) by mouth every 6 (six) hours as needed (Nausea or vomiting). 30 tablet 1   triamterene-hydrochlorothiazide (MAXZIDE-25) 37.5-25 MG tablet TAKE 1 TABLET EVERY DAY 90 tablet 0   vitamin E 180 MG (400 UNITS) capsule Take 400 Units by mouth daily.     No current facility-administered medications for this visit.   Facility-Administered Medications Ordered in Other Visits  Medication Dose Route Frequency Provider Last Rate Last Admin   dextrose 5 % solution   Intravenous Continuous [name], MD   Stopped at 06/17/22 1519   fluorouracil (ADRUCIL) 4,300 mg in sodium chloride 0.9 % 64 mL chemo infusion  2,400 mg/m2 (Order-Specific) Intravenous 1 day or 1 dose [name], MD   Infusion Verify at 06/17/22 1632   heparin lock flush 100 UNIT/ML injection            palonosetron (ALOXI) 0.25 MG/5ML injection            prochlorperazine (COMPAZINE) 10 MG tablet              PHYSICAL EXAMINATION: ECOG PERFORMANCE STATUS: 2 - Symptomatic, <50% confined to bed Vitals:   06/17/22 0954  BP: 125/71  Pulse: (!) 56  Resp: 18  Temp: 97.6 F (36.4 C)    Filed Weights   06/17/22 0954  Weight: 153 lb 3.2 oz (69.5 kg)      Physical Exam Constitutional:      General: She is not in acute distress.    Comments: Patient  sits in wheelchair  HENT:     Head: Normocephalic and atraumatic.  Eyes:     General: No scleral icterus. Cardiovascular:     Rate and Rhythm: Normal rate and regular rhythm.     Heart sounds: Normal heart sounds.  Pulmonary:     Effort: Pulmonary effort is normal. No respiratory distress.     Breath sounds: No wheezing.  Abdominal:     General: Bowel sounds are normal. There is no distension.     Palpations: Abdomen is soft.  Musculoskeletal:        General: No deformity. Normal range of motion.     Cervical back: Normal range of motion and neck supple.  Skin:    General: Skin is warm and dry.     Findings: No erythema or rash.  Neurological:     Mental Status: She is alert and oriented to person, place, and time. Mental status is at baseline.     Cranial Nerves: No cranial nerve deficit.     Coordination: Coordination normal.  Psychiatric:        Mood and Affect: Mood normal.     LABORATORY DATA:  I have reviewed the data as listed     Latest Ref Rng & Units 06/17/2022    9:31 AM 05/27/2022    9:11  AM 05/20/2022    1:30 PM  CBC  WBC 4.0 - 10.5 K/uL 8.2  10.1  10.8   Hemoglobin 12.0 - 15.0 g/dL 10.6  11.0  11.4   Hematocrit 36.0 - 46.0 % 33.9  35.3  35.9   Platelets 150 - 400 K/uL 251  142  132       Latest Ref Rng & Units 06/17/2022    9:31 AM 05/27/2022    9:11 AM 05/20/2022    1:30 PM  CMP  Glucose 70 - 99 mg/dL 98  124  121   BUN 8 - 23 mg/dL 14  10  19    Creatinine 0.44 - 1.00 mg/dL 0.95  0.80  0.92   Sodium 135 - 145 mmol/L 141  137  139   Potassium 3.5 - 5.1 mmol/L 3.0  3.6  3.1   Chloride 98 - 111 mmol/L 105  104  103   CO2 22 - 32 mmol/L 30  27  26    Calcium 8.9 - 10.3 mg/dL 9.1  9.3  9.2   Total Protein 6.5 - 8.1 g/dL 7.0  7.5  7.8   Total Bilirubin 0.3 - 1.2 mg/dL 0.6  0.4  1.0   Alkaline Phos 38 - 126 U/L 88  105  130   AST 15 - 41 U/L 24  32  29   ALT 0 - 44 U/L 14  20  18       RADIOGRAPHIC STUDIES: I have personally reviewed the radiological  images as listed and agreed with the findings in the report. CT CHEST ABDOMEN PELVIS W CONTRAST  Result Date: 04/13/2022 CLINICAL DATA:  Colon mass, liver cancer diagnosed December 2022. * Tracking Code: BO * EXAM: CT CHEST, ABDOMEN, AND PELVIS WITH CONTRAST TECHNIQUE: Multidetector CT imaging of the chest, abdomen and pelvis was performed following the standard protocol during bolus administration of intravenous contrast. RADIATION DOSE REDUCTION: This exam was performed according to the departmental dose-optimization program which includes automated exposure control, adjustment of the mA and/or kV according to patient size and/or use of iterative reconstruction technique. CONTRAST:  119m OMNIPAQUE IOHEXOL 300 MG/ML  SOLN COMPARISON:  12/22/2021. FINDINGS: CT CHEST FINDINGS Cardiovascular: Left IJ Port-A-Cath terminates in the right atrium. Atherosclerotic calcification of the aorta and aortic valve. Heart is enlarged. No pericardial effusion. Mediastinum/Nodes: No pathologically enlarged mediastinal, hilar or axillary lymph nodes. Calcified mediastinal and hilar lymph nodes. Esophagus is unremarkable. Lungs/Pleura: Calcified granulomas. Calcified pleural plaques in the right hemithorax. New areas of ground-glass and consolidation in the right upper lobe, lingula and left lower lobe. No pleural fluid. Airway is unremarkable. Musculoskeletal: T8 and T9 vertebral body fusion with a kyphotic deformity. Degenerative changes in the spine. No worrisome lytic or sclerotic lesions. CT ABDOMEN PELVIS FINDINGS Hepatobiliary: Liver is decreased in attenuation diffusely. Mildly hypoattenuating lesions in the liver measure up to 2.3 cm in the left hepatic lobe, unchanged. Gallbladder is mildly distended. No biliary ductal dilatation. Pancreas: Negative. Spleen: Negative. Adrenals/Urinary Tract: Adrenal glands are unremarkable. Scarring in the kidneys bilaterally. Bilateral renal stones. Ureters are decompressed. Left  posterolateral bladder diverticulum. Bladder is otherwise grossly unremarkable. Stomach/Bowel: Stomach, small bowel and appendix are unremarkable. Eccentric irregular wall thickening in the cecum (2/68), similar. Stool is seen in the majority of the colon, indicative of constipation. Vascular/Lymphatic: Atherosclerotic calcification of the aorta. No pathologically enlarged lymph nodes. Gastrohepatic ligament lymph nodes are subcentimeter in short axis size. Reproductive: Hysterectomy. Heterogeneous mixed cystic and solid mass in the region of  the vaginal cuff measures 3.8 x 5.6 cm, stable from 12/22/2021. Other: No free fluid.  Mesenteries and peritoneum are unremarkable. Musculoskeletal: Degenerative changes in the spine. No worrisome lytic or sclerotic lesions. IMPRESSION: 1. 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 2. Mixed cystic and solid mass in the region of the vaginal cuff, worrisome for malignancy. 3. New areas of patchy ground-glass and consolidation in the right upper lobe, lingula and left lower lobe, likely due to pneumonia. Treatment later organizing pneumonia not excluded. 4. Hepatic steatosis. 5. Bilateral renal stones. 6.  Aortic atherosclerosis (ICD10-I70.0). Electronically Signed   By: Lorin Picket M.D.   On: 04/13/2022 16:26

## 2022-06-17 NOTE — Assessment & Plan Note (Signed)
Continue Imodium PRN as prescribed.

## 2022-06-17 NOTE — Assessment & Plan Note (Addendum)
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  Bilateral fingertips, right worse than left Continue gabapentin 300 mg twice daily.

## 2022-06-17 NOTE — Assessment & Plan Note (Signed)
Chemotherapy plan as listed above 

## 2022-06-17 NOTE — Progress Notes (Signed)
Patient here for follow up. Pt reports not feeling well today.

## 2022-06-18 LAB — CEA: CEA: 3.1 ng/mL (ref 0.0–4.7)

## 2022-06-19 ENCOUNTER — Inpatient Hospital Stay: Payer: Medicare HMO

## 2022-06-19 VITALS — BP 114/70 | HR 65 | Resp 18

## 2022-06-19 DIAGNOSIS — Z5112 Encounter for antineoplastic immunotherapy: Secondary | ICD-10-CM | POA: Diagnosis not present

## 2022-06-19 DIAGNOSIS — Z1509 Genetic susceptibility to other malignant neoplasm: Secondary | ICD-10-CM | POA: Diagnosis not present

## 2022-06-19 DIAGNOSIS — Z79899 Other long term (current) drug therapy: Secondary | ICD-10-CM | POA: Diagnosis not present

## 2022-06-19 DIAGNOSIS — Z111 Encounter for screening for respiratory tuberculosis: Secondary | ICD-10-CM | POA: Diagnosis not present

## 2022-06-19 DIAGNOSIS — C189 Malignant neoplasm of colon, unspecified: Secondary | ICD-10-CM

## 2022-06-19 DIAGNOSIS — C787 Secondary malignant neoplasm of liver and intrahepatic bile duct: Secondary | ICD-10-CM | POA: Diagnosis not present

## 2022-06-19 DIAGNOSIS — D6481 Anemia due to antineoplastic chemotherapy: Secondary | ICD-10-CM | POA: Diagnosis not present

## 2022-06-19 DIAGNOSIS — D701 Agranulocytosis secondary to cancer chemotherapy: Secondary | ICD-10-CM | POA: Diagnosis not present

## 2022-06-19 MED ORDER — HEPARIN SOD (PORK) LOCK FLUSH 100 UNIT/ML IV SOLN
500.0000 [IU] | Freq: Once | INTRAVENOUS | Status: AC | PRN
  Administered 2022-06-19: 500 [IU]
  Filled 2022-06-19: qty 5

## 2022-06-19 MED ORDER — SODIUM CHLORIDE 0.9% FLUSH
10.0000 mL | INTRAVENOUS | Status: DC | PRN
  Administered 2022-06-19: 10 mL
  Filled 2022-06-19: qty 10

## 2022-06-19 MED ORDER — PEGFILGRASTIM-CBQV 6 MG/0.6ML ~~LOC~~ SOSY
6.0000 mg | PREFILLED_SYRINGE | Freq: Once | SUBCUTANEOUS | Status: AC
  Administered 2022-06-19: 6 mg via SUBCUTANEOUS

## 2022-06-24 ENCOUNTER — Ambulatory Visit: Payer: Medicare HMO

## 2022-06-24 ENCOUNTER — Other Ambulatory Visit: Payer: Self-pay

## 2022-06-24 ENCOUNTER — Other Ambulatory Visit: Payer: Medicare HMO

## 2022-06-24 ENCOUNTER — Ambulatory Visit: Payer: Medicare HMO | Admitting: Oncology

## 2022-06-24 ENCOUNTER — Emergency Department: Payer: Medicare HMO

## 2022-06-24 ENCOUNTER — Inpatient Hospital Stay
Admission: EM | Admit: 2022-06-24 | Discharge: 2022-06-26 | DRG: 391 | Disposition: A | Discharge status: Home / Self Care | Payer: Medicare HMO | Attending: Internal Medicine | Admitting: Internal Medicine

## 2022-06-24 ENCOUNTER — Encounter: Payer: Self-pay | Admitting: Internal Medicine

## 2022-06-24 ENCOUNTER — Telehealth: Payer: Self-pay | Admitting: *Deleted

## 2022-06-24 DIAGNOSIS — G8929 Other chronic pain: Secondary | ICD-10-CM | POA: Diagnosis present

## 2022-06-24 DIAGNOSIS — J690 Pneumonitis due to inhalation of food and vomit: Secondary | ICD-10-CM | POA: Diagnosis not present

## 2022-06-24 DIAGNOSIS — D61818 Other pancytopenia: Secondary | ICD-10-CM | POA: Insufficient documentation

## 2022-06-24 DIAGNOSIS — F419 Anxiety disorder, unspecified: Secondary | ICD-10-CM | POA: Diagnosis present

## 2022-06-24 DIAGNOSIS — Z7982 Long term (current) use of aspirin: Secondary | ICD-10-CM

## 2022-06-24 DIAGNOSIS — Z823 Family history of stroke: Secondary | ICD-10-CM

## 2022-06-24 DIAGNOSIS — N200 Calculus of kidney: Secondary | ICD-10-CM | POA: Diagnosis not present

## 2022-06-24 DIAGNOSIS — E785 Hyperlipidemia, unspecified: Secondary | ICD-10-CM | POA: Diagnosis present

## 2022-06-24 DIAGNOSIS — R1310 Dysphagia, unspecified: Secondary | ICD-10-CM

## 2022-06-24 DIAGNOSIS — D649 Anemia, unspecified: Secondary | ICD-10-CM | POA: Diagnosis not present

## 2022-06-24 DIAGNOSIS — Z8261 Family history of arthritis: Secondary | ICD-10-CM

## 2022-06-24 DIAGNOSIS — T17320A Food in larynx causing asphyxiation, initial encounter: Secondary | ICD-10-CM | POA: Diagnosis not present

## 2022-06-24 DIAGNOSIS — Z981 Arthrodesis status: Secondary | ICD-10-CM | POA: Diagnosis not present

## 2022-06-24 DIAGNOSIS — Z8249 Family history of ischemic heart disease and other diseases of the circulatory system: Secondary | ICD-10-CM

## 2022-06-24 DIAGNOSIS — D696 Thrombocytopenia, unspecified: Secondary | ICD-10-CM | POA: Diagnosis present

## 2022-06-24 DIAGNOSIS — R531 Weakness: Secondary | ICD-10-CM | POA: Diagnosis not present

## 2022-06-24 DIAGNOSIS — K2960 Other gastritis without bleeding: Principal | ICD-10-CM | POA: Diagnosis present

## 2022-06-24 DIAGNOSIS — C189 Malignant neoplasm of colon, unspecified: Secondary | ICD-10-CM | POA: Diagnosis present

## 2022-06-24 DIAGNOSIS — D6481 Anemia due to antineoplastic chemotherapy: Secondary | ICD-10-CM | POA: Diagnosis present

## 2022-06-24 DIAGNOSIS — T451X5A Adverse effect of antineoplastic and immunosuppressive drugs, initial encounter: Secondary | ICD-10-CM | POA: Diagnosis present

## 2022-06-24 DIAGNOSIS — C787 Secondary malignant neoplasm of liver and intrahepatic bile duct: Secondary | ICD-10-CM | POA: Diagnosis not present

## 2022-06-24 DIAGNOSIS — K219 Gastro-esophageal reflux disease without esophagitis: Secondary | ICD-10-CM | POA: Diagnosis present

## 2022-06-24 DIAGNOSIS — I10 Essential (primary) hypertension: Secondary | ICD-10-CM | POA: Diagnosis present

## 2022-06-24 DIAGNOSIS — E860 Dehydration: Secondary | ICD-10-CM | POA: Diagnosis not present

## 2022-06-24 DIAGNOSIS — R109 Unspecified abdominal pain: Secondary | ICD-10-CM | POA: Diagnosis not present

## 2022-06-24 DIAGNOSIS — Z880 Allergy status to penicillin: Secondary | ICD-10-CM

## 2022-06-24 DIAGNOSIS — Z825 Family history of asthma and other chronic lower respiratory diseases: Secondary | ICD-10-CM

## 2022-06-24 DIAGNOSIS — R1314 Dysphagia, pharyngoesophageal phase: Secondary | ICD-10-CM | POA: Diagnosis not present

## 2022-06-24 DIAGNOSIS — Z79899 Other long term (current) drug therapy: Secondary | ICD-10-CM

## 2022-06-24 DIAGNOSIS — G629 Polyneuropathy, unspecified: Secondary | ICD-10-CM | POA: Diagnosis present

## 2022-06-24 DIAGNOSIS — F411 Generalized anxiety disorder: Secondary | ICD-10-CM | POA: Diagnosis present

## 2022-06-24 DIAGNOSIS — K319 Disease of stomach and duodenum, unspecified: Secondary | ICD-10-CM | POA: Diagnosis present

## 2022-06-24 DIAGNOSIS — K253 Acute gastric ulcer without hemorrhage or perforation: Secondary | ICD-10-CM | POA: Diagnosis not present

## 2022-06-24 DIAGNOSIS — R918 Other nonspecific abnormal finding of lung field: Secondary | ICD-10-CM | POA: Diagnosis not present

## 2022-06-24 DIAGNOSIS — K3189 Other diseases of stomach and duodenum: Secondary | ICD-10-CM | POA: Diagnosis not present

## 2022-06-24 DIAGNOSIS — Z6836 Body mass index (BMI) 36.0-36.9, adult: Secondary | ICD-10-CM | POA: Diagnosis not present

## 2022-06-24 DIAGNOSIS — K259 Gastric ulcer, unspecified as acute or chronic, without hemorrhage or perforation: Secondary | ICD-10-CM

## 2022-06-24 DIAGNOSIS — R1110 Vomiting, unspecified: Secondary | ICD-10-CM | POA: Diagnosis not present

## 2022-06-24 DIAGNOSIS — J984 Other disorders of lung: Secondary | ICD-10-CM | POA: Diagnosis not present

## 2022-06-24 DIAGNOSIS — E876 Hypokalemia: Secondary | ICD-10-CM | POA: Diagnosis not present

## 2022-06-24 DIAGNOSIS — E669 Obesity, unspecified: Secondary | ICD-10-CM | POA: Diagnosis not present

## 2022-06-24 DIAGNOSIS — R1031 Right lower quadrant pain: Secondary | ICD-10-CM | POA: Diagnosis not present

## 2022-06-24 DIAGNOSIS — F32A Depression, unspecified: Secondary | ICD-10-CM | POA: Diagnosis not present

## 2022-06-24 LAB — CBC WITH DIFFERENTIAL/PLATELET
Abs Immature Granulocytes: 0.11 10*3/uL — ABNORMAL HIGH (ref 0.00–0.07)
Basophils Absolute: 0.1 10*3/uL (ref 0.0–0.1)
Basophils Relative: 1 %
Eosinophils Absolute: 0 10*3/uL (ref 0.0–0.5)
Eosinophils Relative: 0 %
HCT: 32.1 % — ABNORMAL LOW (ref 36.0–46.0)
Hemoglobin: 9.9 g/dL — ABNORMAL LOW (ref 12.0–15.0)
Immature Granulocytes: 1 %
Lymphocytes Relative: 13 %
Lymphs Abs: 1.4 10*3/uL (ref 0.7–4.0)
MCH: 31 pg (ref 26.0–34.0)
MCHC: 30.8 g/dL (ref 30.0–36.0)
MCV: 100.6 fL — ABNORMAL HIGH (ref 80.0–100.0)
Monocytes Absolute: 0.5 10*3/uL (ref 0.1–1.0)
Monocytes Relative: 5 %
Neutro Abs: 8.5 10*3/uL — ABNORMAL HIGH (ref 1.7–7.7)
Neutrophils Relative %: 80 %
Platelets: 93 10*3/uL — ABNORMAL LOW (ref 150–400)
RBC: 3.19 MIL/uL — ABNORMAL LOW (ref 3.87–5.11)
RDW: 16.5 % — ABNORMAL HIGH (ref 11.5–15.5)
Smear Review: NORMAL
WBC: 10.7 10*3/uL — ABNORMAL HIGH (ref 4.0–10.5)
nRBC: 0 % (ref 0.0–0.2)

## 2022-06-24 LAB — COMPREHENSIVE METABOLIC PANEL
ALT: 12 U/L (ref 0–44)
AST: 24 U/L (ref 15–41)
Albumin: 3.3 g/dL — ABNORMAL LOW (ref 3.5–5.0)
Alkaline Phosphatase: 122 U/L (ref 38–126)
Anion gap: 9 (ref 5–15)
BUN: 12 mg/dL (ref 8–23)
CO2: 29 mmol/L (ref 22–32)
Calcium: 9 mg/dL (ref 8.9–10.3)
Chloride: 102 mmol/L (ref 98–111)
Creatinine, Ser: 0.71 mg/dL (ref 0.44–1.00)
GFR, Estimated: >60 mL/min (ref >60)
Glucose, Bld: 94 mg/dL (ref 70–99)
Potassium: 3.7 mmol/L (ref 3.5–5.1)
Sodium: 140 mmol/L (ref 135–145)
Total Bilirubin: 0.9 mg/dL (ref 0.3–1.2)
Total Protein: 7 g/dL (ref 6.5–8.1)

## 2022-06-24 LAB — MAGNESIUM: Magnesium: 1.8 mg/dL (ref 1.7–2.4)

## 2022-06-24 LAB — PHOSPHORUS: Phosphorus: 2.2 mg/dL — ABNORMAL LOW (ref 2.5–4.6)

## 2022-06-24 LAB — LIPASE, BLOOD: Lipase: 48 U/L (ref 11–51)

## 2022-06-24 MED ORDER — ONDANSETRON HCL 4 MG/2ML IJ SOLN
4.0000 mg | Freq: Four times a day (QID) | INTRAMUSCULAR | Status: DC | PRN
  Administered 2022-06-25: 4 mg via INTRAVENOUS
  Filled 2022-06-24: qty 2

## 2022-06-24 MED ORDER — POTASSIUM CHLORIDE CRYS ER 20 MEQ PO TBCR: 20.0000 meq | EXTENDED_RELEASE_TABLET | Freq: Every day | ORAL | Status: DC

## 2022-06-24 MED ORDER — PROCHLORPERAZINE MALEATE 10 MG PO TABS: 10.0000 mg | ORAL_TABLET | Freq: Four times a day (QID) | ORAL | Status: DC | PRN

## 2022-06-24 MED ORDER — MORPHINE SULFATE (PF) 2 MG/ML IV SOLN: 2.0000 mg | INTRAVENOUS | Status: DC | PRN

## 2022-06-24 MED ORDER — ATORVASTATIN CALCIUM 10 MG PO TABS
10.0000 mg | ORAL_TABLET | Freq: Every day | ORAL | Status: DC
  Administered 2022-06-24 – 2022-06-26 (×3): 10 mg via ORAL
  Filled 2022-06-24 (×3): qty 1

## 2022-06-24 MED ORDER — ONDANSETRON HCL 4 MG PO TABS: 4.0000 mg | ORAL_TABLET | Freq: Four times a day (QID) | ORAL | Status: DC | PRN

## 2022-06-24 MED ORDER — FAMOTIDINE IN NACL 20-0.9 MG/50ML-% IV SOLN
20.0000 mg | Freq: Once | INTRAVENOUS | Status: AC
  Administered 2022-06-24: 20 mg via INTRAVENOUS
  Filled 2022-06-24: qty 50

## 2022-06-24 MED ORDER — ENOXAPARIN SODIUM 40 MG/0.4ML IJ SOSY
40.0000 mg | PREFILLED_SYRINGE | INTRAMUSCULAR | Status: DC
  Administered 2022-06-24 – 2022-06-25 (×2): 40 mg via SUBCUTANEOUS
  Filled 2022-06-24 (×2): qty 0.4

## 2022-06-24 MED ORDER — PANTOPRAZOLE SODIUM 40 MG IV SOLR
40.0000 mg | Freq: Once | INTRAVENOUS | Status: DC
  Filled 2022-06-24: qty 10

## 2022-06-24 MED ORDER — OXYCODONE HCL 5 MG PO TABS
5.0000 mg | ORAL_TABLET | ORAL | Status: DC | PRN
  Administered 2022-06-25: 5 mg via ORAL
  Filled 2022-06-24: qty 1

## 2022-06-24 MED ORDER — POLYETHYLENE GLYCOL 3350 17 GM/SCOOP PO POWD
17.0000 g | Freq: Every day | ORAL | Status: DC
  Filled 2022-06-24: qty 255

## 2022-06-24 MED ORDER — PANTOPRAZOLE SODIUM 40 MG IV SOLR: 40.0000 mg | INTRAVENOUS | Status: DC

## 2022-06-24 MED ORDER — SODIUM CHLORIDE 0.9 % IV SOLN: INTRAVENOUS | Status: DC

## 2022-06-24 MED ORDER — HYDRALAZINE HCL 20 MG/ML IJ SOLN: 10.0000 mg | INTRAMUSCULAR | Status: DC | PRN

## 2022-06-24 MED ORDER — CYANOCOBALAMIN 500 MCG PO TABS
500.0000 ug | ORAL_TABLET | Freq: Every day | ORAL | Status: DC
  Administered 2022-06-24 – 2022-06-26 (×3): 500 ug via ORAL
  Filled 2022-06-24 (×3): qty 1

## 2022-06-24 MED ORDER — IOHEXOL 300 MG/ML  SOLN
100.0000 mL | Freq: Once | INTRAMUSCULAR | Status: AC | PRN
  Administered 2022-06-24: 100 mL via INTRAVENOUS

## 2022-06-24 MED ORDER — TRIAMTERENE-HCTZ 37.5-25 MG PO TABS
1.0000 | ORAL_TABLET | Freq: Every day | ORAL | Status: DC
  Administered 2022-06-25 – 2022-06-26 (×2): 1 via ORAL
  Filled 2022-06-24 (×2): qty 1

## 2022-06-24 MED ORDER — ALPRAZOLAM 0.5 MG PO TABS
0.5000 mg | ORAL_TABLET | Freq: Two times a day (BID) | ORAL | Status: DC | PRN
  Administered 2022-06-24 – 2022-06-26 (×3): 0.5 mg via ORAL
  Filled 2022-06-24 (×3): qty 1

## 2022-06-24 MED ORDER — ACETAMINOPHEN 650 MG RE SUPP: 650.0000 mg | Freq: Four times a day (QID) | RECTAL | Status: DC | PRN

## 2022-06-24 MED ORDER — ACETAMINOPHEN 325 MG PO TABS
650.0000 mg | ORAL_TABLET | Freq: Four times a day (QID) | ORAL | Status: DC | PRN
  Administered 2022-06-24 – 2022-06-25 (×2): 650 mg via ORAL
  Filled 2022-06-24 (×2): qty 2

## 2022-06-24 MED ORDER — FLUOXETINE HCL 20 MG PO CAPS
60.0000 mg | ORAL_CAPSULE | Freq: Every morning | ORAL | Status: DC
  Administered 2022-06-24 – 2022-06-26 (×3): 60 mg via ORAL
  Filled 2022-06-24 (×3): qty 3

## 2022-06-24 MED ORDER — DOCUSATE SODIUM 100 MG PO CAPS
100.0000 mg | ORAL_CAPSULE | Freq: Two times a day (BID) | ORAL | Status: DC
  Administered 2022-06-24 – 2022-06-25 (×2): 100 mg via ORAL
  Filled 2022-06-24 (×5): qty 1

## 2022-06-24 MED ORDER — GABAPENTIN 300 MG PO CAPS: 300.0000 mg | ORAL_CAPSULE | Freq: Two times a day (BID) | ORAL | Status: DC

## 2022-06-24 MED ORDER — PANTOPRAZOLE SODIUM 40 MG IV SOLR
40.0000 mg | Freq: Two times a day (BID) | INTRAVENOUS | Status: DC
  Administered 2022-06-24 – 2022-06-26 (×4): 40 mg via INTRAVENOUS
  Filled 2022-06-24 (×4): qty 10

## 2022-06-24 MED ORDER — BUSPIRONE HCL 10 MG PO TABS
5.0000 mg | ORAL_TABLET | Freq: Three times a day (TID) | ORAL | Status: DC
  Administered 2022-06-24 – 2022-06-26 (×5): 5 mg via ORAL
  Filled 2022-06-24 (×6): qty 1

## 2022-06-24 MED ORDER — METHOCARBAMOL 500 MG PO TABS
500.0000 mg | ORAL_TABLET | Freq: Three times a day (TID) | ORAL | Status: DC | PRN
  Administered 2022-06-24 – 2022-06-25 (×2): 500 mg via ORAL
  Filled 2022-06-24 (×2): qty 1

## 2022-06-24 MED ORDER — VITAMIN E 45 MG (100 UNIT) PO CAPS
400.0000 [IU] | ORAL_CAPSULE | Freq: Every day | ORAL | Status: DC
  Administered 2022-06-25 – 2022-06-26 (×2): 400 [IU] via ORAL
  Filled 2022-06-24 (×2): qty 4

## 2022-06-24 MED ORDER — LORATADINE 10 MG PO TABS: 10.0000 mg | ORAL_TABLET | ORAL | Status: DC

## 2022-06-24 MED ORDER — MECLIZINE HCL 25 MG PO TABS: 12.5000 mg | ORAL_TABLET | Freq: Three times a day (TID) | ORAL | Status: DC | PRN

## 2022-06-24 MED ORDER — SODIUM CHLORIDE 0.9 % IV BOLUS
1000.0000 mL | Freq: Once | INTRAVENOUS | Status: AC
  Administered 2022-06-24: 1000 mL via INTRAVENOUS

## 2022-06-24 NOTE — Telephone Encounter (Signed)
Call returned to [facility] and advised of doctor wanting patient to go to ER, she said they are there and they are calling her back now

## 2022-06-24 NOTE — Telephone Encounter (Signed)
[name] called reporting that patient has had difficulty swallowing  and unable to keep things down, patient describes it as she feels there is something in her throat keeping things from going down so it is coming back up for the past 4 days She said that she was going to take patient to ER, patient  really wants to come to Cancer center to be seen for this , I explained to her that I did not think we had appointment available today, so ER would be best if she needs to be seen today. She voiced concern that they may give her wrong medicine or something, I explained to her that they can see our notes. She is going to take her to ER, but would like to get a call if we can get patient into our office this morning. Please advise

## 2022-06-24 NOTE — ED Provider Notes (Signed)
[facility] Provider Note    Event Date/Time   First MD Initiated Contact with Patient 06/24/22 1118     (approximate)   History   Emesis   HPI  Chelsea [name] is a 76 y.o. female with history of metastatic colon cancer, anemia, neuropathy (with last chemotherapy 5 days ago) who presents with dysphagia and abdominal pain.  The patient states that she has had difficulty swallowing over the last 3 days.  When she tries to swallow it almost feels like there is a sore in her throat and it is too painful to do so.  She is able to tolerate her own saliva but has difficulty swallowing both liquids and solids.  She has not eaten anything in the last 3 days.  In addition, over the last couple days, the patient has developed right lower abdominal pain which is intermittent and somewhat positional.  She had an episode of diarrhea yesterday.    Physical Exam   Triage Vital Signs: ED Triage Vitals  Enc Vitals Group     BP 06/24/22 1108 124/62     Pulse Rate 06/24/22 1108 78     Resp 06/24/22 1108 18     Temp 06/24/22 1108 98.3 F (36.8 C)     Temp Source 06/24/22 1108 Oral     SpO2 06/24/22 1108 95 %     Weight 06/24/22 1110 153 lb 3.5 oz (69.5 kg)     Height 06/24/22 1110 '4\' 9"'$  (1.448 m)     Head Circumference --      Peak Flow --      Pain Score 06/24/22 1110 8     Pain Loc --      Pain Edu? --      Excl. in [place]? --     Most recent vital signs: Vitals:   06/24/22 1108 06/24/22 1330  BP: 124/62 135/71  Pulse: 78 71  Resp: 18 20  Temp: 98.3 F (36.8 C)   SpO2: 95% 96%     General: Alert, relatively comfortable appearing. CV:  Good peripheral perfusion.  Resp:  Normal effort.  Abd:  Soft with right lower quadrant tenderness.  No distention.  Other:  Oropharynx clear with no thrush or lesions.  No erythema or induration.  No pooled secretions or stridor.  Clear voice.   ED Results / Procedures / Treatments   Labs (all labs ordered are  listed, but only abnormal results are displayed) Labs Reviewed  COMPREHENSIVE METABOLIC PANEL - Abnormal; Notable for the following components:      Result Value   Albumin 3.3 (*)    All other components within normal limits  CBC WITH DIFFERENTIAL/PLATELET - Abnormal; Notable for the following components:   WBC 10.7 (*)    RBC 3.19 (*)    Hemoglobin 9.9 (*)    HCT 32.1 (*)    MCV 100.6 (*)    RDW 16.5 (*)    Platelets 93 (*)    Neutro Abs 8.5 (*)    Abs Immature Granulocytes 0.11 (*)    All other components within normal limits  LIPASE, BLOOD  URINALYSIS, ROUTINE W REFLEX MICROSCOPIC     EKG  ED ECG REPORT I, Arta Silence, the attending physician, personally viewed and interpreted this ECG.  Date: 06/24/2022 EKG Time: 1119 Rate: 67 Rhythm: normal sinus rhythm with PACs QRS Axis: normal Intervals: normal ST/T Wave abnormalities: normal Narrative Interpretation: no evidence of acute ischemia     RADIOLOGY  Chest x-ray: I independently viewed and interpreted the images; there is no focal consolidation or edema  CT abdomen/pelvis: I independently viewed and interpreted the images; there are no dilated bowel loops, free air, or free fluid.  Radiology report indicates the following:  IMPRESSION:  There are low-density lesions in liver consistent with metastatic  disease with no significant change. Wall thickening in the cecum  appears less prominent. There is 5.5 x 4.2 cm mixed density lesion  in vaginal cuff suggesting possible metastatic disease.    There is no evidence of intestinal obstruction or pneumoperitoneum.  There is no hydronephrosis.    Small patchy infiltrates are seen in the lower lung fields  suggesting atelectasis/pneumonia.    Small bilateral renal stones. Diverticulosis of colon without signs  of diverticulitis. 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 There is encroachment of neural foramina at L4-L5 and  L5-S1  levels.    Other findings as described in the body of the report.      Electronically Signed    By: Elmer Picker M.D.    On: 06/24/2022 14:27      PROCEDURES:  Critical Care performed: No  Procedures   MEDICATIONS ORDERED IN ED: Medications  pantoprazole (PROTONIX) injection 40 mg (has no administration in time range)  sodium chloride 0.9 % bolus 1,000 mL (1,000 mLs Intravenous Bolus 06/24/22 1323)  famotidine (PEPCID) IVPB 20 mg premix (0 mg Intravenous Stopped 06/24/22 1459)  iohexol (OMNIPAQUE) 300 MG/ML solution 100 mL (100 mLs Intravenous Contrast Given 06/24/22 1342)     IMPRESSION / MDM / ASSESSMENT AND PLAN / ED COURSE  I reviewed the triage vital signs and the nursing notes.  I reviewed the past medical records.  The patient follows with Dr. Tasia Catchings from oncology and was last seen on 9/13.  She is receiving FOLFOX bevacizumab chemotherapy with her last treatment being last week.  Differential diagnosis includes, but is not limited to:  Dysphagia: GERD, esophagitis, esophageal stricture, dysmotility, chemotherapy side effect, metastatic disease.  I am concerned for dehydration given the lack of p.o. intake.  Abdominal pain: Chemotherapy side effect, colitis, diverticulitis, gastroenteritis, bowel obstruction, appendicitis, volvulus.  We will obtain chest x-ray, CT abdomen/pelvis, lab work-up, give fluids, and reassess.  Patient's presentation is most consistent with acute presentation with potential threat to life or bodily function.  The patient is on the cardiac monitor to evaluate for evidence of arrhythmia and/or significant heart rate changes.  ----------------------------------------- 3:02 PM on 06/24/2022 -----------------------------------------  CT shows no concerning acute findings.  Chest x-ray is clear.  Lab work-up is overall reassuring with no significant leukocytosis or electrolyte abnormalities.  Lipase is normal.  The patient had no improvement  with IV Pepcid.  Given the persistent dysphagia and inability to eat and drink, I recommended admission and the patient and family member agreed.  I consulted Dr. Marius Ditch from GI to evaluate.  She recommended IV Protonix.  I then consulted Dr. Dwyane Dee from the hospitalist service; based on our discussion he agrees to admit the patient.   FINAL CLINICAL IMPRESSION(S) / ED DIAGNOSES   Final diagnoses:  Dysphagia, unspecified type  Abdominal pain, unspecified abdominal location     Rx / DC Orders   ED Discharge Orders     None        Note:  This document was prepared using Dragon voice recognition software and may include unintentional dictation errors.    Arta Silence, MD 06/24/22 [phone]

## 2022-06-24 NOTE — ED Notes (Signed)
Pt presents to ED with c/o of ABD pain and issues swallowing. Pt states she feels like there is a open sore in her throat, pt c/o of pain but denies feeling like anything is getting tuck. No visible thrush or issues noted on visulation of throat. Pt also c/o of ABD pain mostly located in RLQ. Pt states 1 bout of watery stool but denies fever or chills. Pt is A&Ox4. NAD noted.

## 2022-06-24 NOTE — ED Triage Notes (Signed)
Pt here with emesis and trouble swallowing since Thursday. Pt just finished chemo recently. Pt states RLQ and right pelvic pain that is sharp. Pt also having N/VD. 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

## 2022-06-24 NOTE — Telephone Encounter (Signed)
Chelsea [name] - please advise to go to ER - per Dr Tasia Catchings. thanks.

## 2022-06-24 NOTE — H&P (Signed)
[facility] History and Physical   Patient: Chelsea [name] EGB:[id number] DOB: September 03, 1946 DOA: 06/24/2022 PCP: Jearld Fenton, NP  DOS: the patient was seen and examined on 06/24/2022  Patient coming from: Home  Chief Complaint: Difficulty in swallowing with abdominal pain  HPI: Chelsea [name] is a 76 y.o. 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 Tasia Catchings, oncologist, essential hypertension, chronic pain/neuropathy, degenerative disc disease, depression with anxiety now presents to emergency department with 2 to 3-day history of difficulty in swallowing and 1 day history of abdominal pain.  Patient states that for the past 2 to 3 days she is unable to swallow except her on oral secretion.  Slowly she is starting to hurt whenever she tried to swallow.  She tried some pudding and was not successful.  She also has some upper abdominal pain without any radiation.  Her abdominal pain is mild and intermittent in nature.  She called patient's oncologist office who advised her to go to ER.  She does have some nausea but denies any vomiting, fever, chills, headache, chest pain, constipation/diarrhea or urinary complaints.    ED course: Patient's initial vitals revealed temperature of 98.3 F, heart rate of 78, blood pressure of 124/62, respiratory rate of 18 with oxygen saturation 95% on room air.    Blood work revealed mild leukocytosis with a WBC count of 10,700, anemia with hemoglobin of 9.9 g and thrombocytopenia with platelet count of 93,000.  Rest of the labs are grossly unremarkable.  Imaging:  Chest x-ray revealed no acute cardiopulmonary process.  CT abdomen and pelvis with contrast revealed low-density lesions in liver consistent with metastatic disease with no significant change. Wall thickening in the cecum appears less prominent. There is 5.5 x 4.2 cm mixed density lesion in vaginal  cuff suggesting possible metastatic disease. There is no evidence of intestinal obstruction or pneumoperitoneum.  Diverticulosis of colon without signs of diverticulitis.  Small patchy infiltrates are seen in the lower lung fields suggesting atelectasis/pneumonia. 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 There is encroachment of neural foramina at L4-L5 and L5-S1 levels.  Treatment in the ED Patient was treated in the emergency department with IV fluid and IV Pepcid and IV Zofran.  Patient did not feel much better.  ED provider discussed with Dr. Marius Ditch, gastroenterologist on-call who recommended to treat patient with IV PPI and she has agreed to come and evaluate the patient for further recommendation.  Patient is being admitted to hospitalist service for further management.  Review of Systems: As mentioned in the history of present illness. All other systems reviewed and are negative.  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)    Moderate mitral insufficiency    Port-A-Cath in place    Seizures [facility])    Spinal stenosis    Ulcer    Urinary incontinence    Past Surgical History:  Procedure Laterality Date   ABDOMINAL HYSTERECTOMY     BACK SURGERY     due to polio   BREAST BIOPSY Bilateral    neg   BREAST BIOPSY Right 2011   neg/stereo   CARPAL TUNNEL RELEASE     CATARACT EXTRACTION Right 2020   COLONOSCOPY WITH PROPOFOL N/A 04/04/2021  Procedure: COLONOSCOPY WITH PROPOFOL;  Surgeon: Virgel Manifold, MD;  Location: [facility] ENDOSCOPY;  Service: Endoscopy;  Laterality: N/A;   EYE SURGERY     IR IMAGING GUIDED PORT INSERTION  10/09/2021   Social History:  reports that she has never smoked. She has never used smokeless tobacco. She reports that she does not drink alcohol and does not use drugs.  Allergies  Allergen Reactions   Penicillins      "Everything turned black"    Family history reviewed and not pertinent Family History  Problem Relation Age of Onset   Arthritis Mother    Heart disease Mother    Stroke Mother    Hypertension Mother    COPD Mother    Sudden death Sister    Other Father        unknown medical history   Breast cancer Neg Hx     Prior to Admission medications   Medication Sig Start Date End Date Taking? Authorizing Provider  acetaminophen (TYLENOL) 500 MG tablet Take 500 mg by mouth every 6 (six) hours as needed.    [provider]  ALPRAZolam Duanne Moron) 0.5 MG tablet TAKE 1 TABLET BY MOUTH TWICE A DAY AS NEEDED FOR ANXIETY 06/16/22   Jearld Fenton, NP  aspirin 325 MG tablet Take 325 mg by mouth daily.    [provider]  atorvastatin (LIPITOR) 10 MG tablet TAKE 1 TABLET BY MOUTH EVERY DAY 05/11/22   Jearld Fenton, NP  busPIRone (BUSPAR) 5 MG tablet Take 1 tablet (5 mg total) by mouth 3 (three) times daily. 11/05/21   Jearld Fenton, NP  Cyanocobalamin (VITAMIN B 12 PO) Take 500 mcg by mouth daily.    [provider]  diclofenac Sodium (VOLTAREN) 1 % GEL Apply 4 g topically 4 (four) times daily. 12/17/20   Kathrine Haddock, NP  docusate sodium (COLACE) 100 MG capsule Take 1 capsule (100 mg total) by mouth 2 (two) times daily. 02/25/22   [name], MD  FLUoxetine (PROZAC) 20 MG capsule TAKE 3 CAPSULES (60 MG TOTAL) BY MOUTH EVERY MORNING. 06/16/22   Jearld Fenton, NP  gabapentin (NEURONTIN) 100 MG capsule Take 200 mg by mouth 2 (two) times daily. 04/04/22   [provider]  gabapentin (NEURONTIN) 300 MG capsule Take 1 capsule (300 mg total) by mouth 2 (two) times daily. 03/11/22   [name], MD  lidocaine (LIDODERM) 5 % Place 1 patch onto the skin daily. Remove & Discard patch within 12 hours or as directed by MD 10/24/20   Max Sane, MD  lidocaine-prilocaine (EMLA) cream Apply small amount of cream to port site 1-2 hour prior to chemo treatment. 05/13/22   [name], MD   loperamide (IMODIUM) 2 MG capsule TAKE 1 CAP BY MOUTH SEE ADMIN INSTRUCTIONS. INITIAL: 4 MG, FOLLOWED BY 2 MG AFTER EACH LOOSE STOOL MAXIMUM: 16 MG/DAY 02/13/22   [name], MD  loratadine (CLARITIN) 10 MG tablet Take 1 tablet (10 mg total) by mouth See admin instructions. Take 1 tablet daily for 4 days after each chemotherapy treatments. 05/05/22   [name], MD  meclizine (ANTIVERT) 12.5 MG tablet Take 1 tablet (12.5 mg total) by mouth 3 (three) times daily as needed for dizziness. 07/08/21   Jearld Fenton, NP  methocarbamol (ROBAXIN) 500 MG tablet TAKE 1 TABLET TWICE DAILY AS NEEDED FOR MUSCLE SPASM(S) 05/19/22   Jearld Fenton, NP  ondansetron (ZOFRAN) 8 MG tablet Take 1 tablet (8 mg total) by mouth  2 (two) times daily as needed (Nausea or vomiting). 09/25/21   [name], MD  polyethylene glycol powder (GLYCOLAX/MIRALAX) 17 GM/SCOOP powder Take 17 g by mouth daily. 01/28/21   Jearld Fenton, NP  potassium chloride SA (KLOR-CON M) 20 MEQ tablet TAKE 1 TABLET EVERY DAY 06/11/22   Jearld Fenton, NP  prochlorperazine (COMPAZINE) 10 MG tablet Take 1 tablet (10 mg total) by mouth every 6 (six) hours as needed (Nausea or vomiting). 09/25/21   [name], MD  triamterene-hydrochlorothiazide [facility]) 37.5-25 MG tablet TAKE 1 TABLET EVERY DAY 06/16/22   Jearld Fenton, NP  vitamin E 180 MG (400 UNITS) capsule Take 400 Units by mouth daily.    [provider]    Physical Exam: Vitals:   06/24/22 1108 06/24/22 1110 06/24/22 1330  BP: 124/62  135/71  Pulse: 78  71  Resp: 18  20  Temp: 98.3 F (36.8 C)    TempSrc: Oral    SpO2: 95%  96%  Weight:  69.5 kg   Height:  '4\' 9"'$  (1.448 m)    GENERAL: Female patient lying in the bed.  In mild distress due to difficulty in swallowing.    EYES: Pupils equal, round, reactive to light and accommodation. No scleral icterus. Extraocular muscles intact.  HEENT: Head atraumatic, normocephalic. Oropharynx and nasopharynx dry and clear.  NECK:  Supple, no  jugular venous distention. No thyroid enlargement, no tenderness.  LUNGS: Normal breath sounds bilaterally, no wheezing, rales,rhonchi or crepitation. No use of accessory muscles of respiration.  CARDIOVASCULAR: Regular rate and rhythm, S1, S2 normal. No murmurs, rubs, or gallops.  ABDOMEN: Soft, nondistended, nontender. Bowel sounds present. No organomegaly or mass.  EXTREMITIES: No pedal edema, cyanosis, or clubbing.  NEUROLOGIC: Cranial nerves II through XII are intact. Muscle strength 5/5 in all extremities. Sensation intact. Gait not checked.  PSYCHIATRIC: The patient is alert and oriented x 3.  Normal affect and good eye contact. SKIN: No obvious rash, lesion, or ulcer.      Data Reviewed: I have personally reviewed and interpreted labs, imaging as discussed below.  CBC: Recent Labs  Lab 06/24/22 1144  WBC 10.7*  NEUTROABS 8.5*  HGB 9.9*  HCT 32.1*  MCV 100.6*  PLT 93*   Basic Metabolic Panel: Recent Labs  Lab 06/24/22 1144  NA 140  K 3.7  CL 102  CO2 29  GLUCOSE 94  BUN 12  CREATININE 0.71  CALCIUM 9.0   GFR: Estimated Creatinine Clearance: 47.4 mL/min (by C-G formula based on SCr of 0.71 mg/dL). Liver Function Tests: Recent Labs  Lab 06/24/22 1144  AST 24  ALT 12  ALKPHOS 122  BILITOT 0.9  PROT 7.0  ALBUMIN 3.3*   Recent Labs  Lab 06/24/22 1144  LIPASE 48   No results for input(s): "AMMONIA" in the last 168 hours. Coagulation Profile: No results for input(s): "INR", "PROTIME" in the last 168 hours. Cardiac Enzymes: No results for input(s): "CKTOTAL", "CKMB", "CKMBINDEX", "TROPONINI" in the last 168 hours. BNP (last 3 results) No results for input(s): "PROBNP" in the last 8760 hours. HbA1C: No results for input(s): "HGBA1C" in the last 72 hours. CBG: No results for input(s): "GLUCAP" in the last 168 hours. Lipid Profile: No results for input(s): "CHOL", "HDL", "LDLCALC", "TRIG", "CHOLHDL", "LDLDIRECT" in the last 72 hours. Thyroid Function  Tests: No results for input(s): "TSH", "T4TOTAL", "FREET4", "T3FREE", "THYROIDAB" in the last 72 hours. Anemia Panel: No results for input(s): "VITAMINB12", "FOLATE", "FERRITIN", "TIBC", "IRON", "RETICCTPCT" in  the last 72 hours. Urine analysis:    Component Value Date/Time   COLORURINE STRAW (A) 10/22/2021 1120   APPEARANCEUR CLEAR (A) 10/22/2021 1120   APPEARANCEUR Cloudy (A) 08/26/2021 1157   LABSPEC 1.010 10/22/2021 1120   PHURINE 6.0 10/22/2021 1120   GLUCOSEU NEGATIVE 10/22/2021 1120   HGBUR NEGATIVE 10/22/2021 1120   BILIRUBINUR NEGATIVE 10/22/2021 1120   BILIRUBINUR Negative 08/26/2021 1157   KETONESUR NEGATIVE 10/22/2021 1120   PROTEINUR NEGATIVE 10/22/2021 1120   UROBILINOGEN 0.2 01/14/2021 1354   NITRITE NEGATIVE 10/22/2021 1120   LEUKOCYTESUR NEGATIVE 10/22/2021 1120    Radiological Exams on Admission: CT ABDOMEN PELVIS W CONTRAST  Result Date: 06/24/2022 CLINICAL DATA:  Pain right lower quadrant, pelvic pain, vomiting, metastatic colon carcinoma EXAM: CT ABDOMEN AND PELVIS WITH CONTRAST TECHNIQUE: Multidetector CT imaging of the abdomen and pelvis was performed using the standard protocol following bolus administration of intravenous contrast. RADIATION DOSE REDUCTION: This exam was performed according to the departmental dose-optimization program which includes automated exposure control, adjustment of the mA and/or kV according to patient size and/or use of iterative reconstruction technique. CONTRAST:  129m OMNIPAQUE IOHEXOL 300 MG/ML  SOLN COMPARISON:  Previous studies including the examination of 04/13/2022 FINDINGS: Lower chest: Small patchy infiltrates are seen in lower lung fields, more so in the left lower lobe. There is calcified granuloma in left lower lung field. Hepatobiliary: There are a few low-density lesions in liver largest measuring 2 cm with no significant interval change. There is fatty infiltration in liver. Gallbladder is distended. There is no wall  thickening. There is no dilation of bile ducts. Pancreas: No focal abnormalities are seen. Spleen: Unremarkable. Adrenals/Urinary Tract: Adrenals are unremarkable. There is no hydronephrosis. 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 There is 2 mm calculus in the right kidney. There is 2 mm calculus in the left kidney. Urinary bladder is distended. Diverticulum is noted in the left posterolateral wall of the bladder. Stomach/Bowel: Stomach is not distended. Small bowel loops are not dilated. Appendix is not seen. There is no pericecal inflammation. Wall thickening in cecum appears less prominent. In the current study, maximum wall thickness in the medial aspect of the cecum measures 12 mm. Few scattered diverticula are seen in colon without signs of focal acute diverticulitis. Vascular/Lymphatic: Scattered arterial calcifications are seen. Reproductive: Uterus is not seen. There is 5.5 x 4.2 cm inhomogeneous soft tissue mass and fluid density in vaginal cuff with no significant interval change. Small amount of fluid is seen in the vaginal canal. Other: There is no ascites or pneumoperitoneum. Small umbilical hernia containing fat is seen. Musculoskeletal: There is minimal anterolisthesis at L4-L5 level. There is severe spinal stenosis and encroachment of neural foramina at L4-L5 level. There is encroachment of neural foramina at L5-S1 level, more so on the right side. There is partial fusion of bodies of L5 and S1 vertebrae. IMPRESSION: There are low-density lesions in liver consistent with metastatic disease with no significant change. Wall thickening in the cecum appears less prominent. There is 5.5 x 4.2 cm mixed density lesion in vaginal cuff suggesting possible metastatic disease. There is no evidence of intestinal obstruction or pneumoperitoneum. There is no hydronephrosis. Small patchy infiltrates are seen in the lower lung fields suggesting atelectasis/pneumonia.  Small bilateral renal stones. Diverticulosis of colon without signs of diverticulitis. 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 There is encroachment of neural foramina at L4-L5 and L5-S1 levels. Other findings as described in the body  of the report. Electronically Signed   By: Elmer Picker M.D.   On: 06/24/2022 14:27   DG Chest 2 View  Result Date: 06/24/2022 CLINICAL DATA:  Dysphagia. EXAM: CHEST - 2 VIEW COMPARISON:  Chest x-ray 06/17/2021. FINDINGS: Similar mild enlargement the cardiac silhouette. Left IJ approach Port-A-Cath with the tip projecting at the superior right atrium. No consolidation. No visible pleural effusions or pneumothorax. Partially imaged ACDF. Polyarticular degenerative change. 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 IMPRESSION: No evidence of acute cardiopulmonary disease. Electronically Signed   By: Margaretha Sheffield M.D.   On: 06/24/2022 12:18      I reviewed all nursing notes, pharmacy notes, vitals, pertinent old records.  Assessment/Plan  Principal Problem:   Dysphagia Active Problems:   Dehydration   Metastatic colon cancer to liver (HCC)   Neuropathy   Thrombocytopenia (HCC)   Chronic anemia   Essential hypertension   GAD (generalized anxiety disorder)  1. Dysphagia -Patient presents with dysphagia to both solid and liquid for the past 2 to 3 days except able to swallow her own oral secretions at times.  She does have metastatic colon cancer currently undergoing chemo.  She is dehydrated. -Admit her to medical floor. -IV fluid for hydration.  IV Protonix 40 mg daily.  NPO. -ED provider has discussed with Dr. Marius Ditch, gastroenterologist.  As per gastroenterologist recommendation, patient will undergo EGD tomorrow a.m.   2.  Dehydration -Secondary to decreased oral intake as mentioned above.  IV fluid for hydration.  3.  Anemia  -Chronic in nature and secondary to chemotherapy. -Hemoglobin 9.9 g.  We will continue  to monitor.   4.  Thrombocytopenia -Patient's platelet count noted to be 93,000.  Her recent platelet count dated 06/17/2022 was 251,000.  There is significant drop in her platelet count.  No evidence of acute bleed at this time.  We will continue to monitor.  5.  Metastatic colon cancer -Patient currently follows with Dr. Tasia Catchings, oncologist. -We will continue to evaluate dysphagia as mentioned above.  If warranted, consult oncologist.  6.  Essential hypertension -Blood pressure stable.  Continue home medication.  Use IV hydralazine as needed elevated blood pressure.  7.  GERD -IV PPI as mentioned above.  8. Chronic pain/neuropathy/degenerative disc disease -Continue home medication for pain control.       Nutrition: NPO  DVT Prophylaxis: Subcutaneous Lovenox  Advance goals of care discussion:   Code Status: Full Code   Consults: ED provider discussed with Dr. Marius Ditch, gastroenterologist on-call.  She has suggested that she will perform EGD tomorrow in AM.     Family Communication: Patient's daughter at the bedside.  Discussed with both patient and her daughter in detail.   Opportunity was given to ask question and all questions were answered satisfactorily.   Disposition:  From: Home Likely will need Home on discharge.   Author: Caryn Bee, MD [facility] 06/24/2022 7:04 PM   To reach On-call, see care teams to locate the attending and reach out to them via [email or web address]. If 7PM-7AM, please contact night-coverage If you still have difficulty reaching the attending provider, please page the [facility] (Director on Call) for [facility] on amion for assistance.

## 2022-06-24 NOTE — Consult Note (Signed)
Cephas Darby, MD [address]  [place]  [place], [place] [address]  Main: [phone]  Fax: [phone] Pager: [phone]   Consultation  Referring Provider:     No ref. provider found Primary Care Physician:  Jearld Fenton, NP Primary Gastroenterologist:  Dr. Bonna Gains       Reason for Consultation: Dysphagia and pain during swallowing  Date of Admission:  06/24/2022 Date of Consultation:  06/24/2022         HPI:   Chelsea [name] is a 76 y.o. 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  Patient's daughter is also bedside who reports that patient has poor appetite with poor p.o. intake during chemotherapy sessions.  Generally, each session is about 2 days.  At this time, her symptoms persisted and were sent which brought her to the ER.  Patient reports that she is not even able to swallow liquids.  She does have history of chronic reflux symptoms.  Patient was hemodynamically stable in the ER, labs revealed mild leukocytosis, thrombocytopenia, normal serum lipase, normal CMP, mildly low albumin 3.3.  CT abdomen and pelvis did not reveal any acute intra-abdominal pathology.  Patient is kept n.p.o. and GI is consulted for possible EGD.  Patient does report pain in her entire right side of her abdomen.  She also feels constipated, however, she has not been eating much within last 1 week   NSAIDs: None  Antiplts/Anticoagulants/Anti thrombotics: None  GI Procedures: Colonoscopy in 04/2021 which revealed cecal mass DIAGNOSIS:  A. COLON MASS, CECUM; COLD BIOPSY:  - TUBULOVILLOUS ADENOMA WITH FOCAL HIGH-GRADE DYSPLASIA.   Biopsy of the liver lesion DIAGNOSIS:  A. LIVER LESION, LEFT LOBE;  - DIAGNOSTIC OF MALIGNANCY.  - METASTATIC ADENOCARCINOMA, COMPATIBLE WITH COLORECTAL PRIMARY.  Past Medical History:  Diagnosis Date    Anxiety    Arthritis    Depression    Hyperlipidemia    Hypertension    Liver mass    Metastatic colon cancer to liver (HCC)    Moderate mitral insufficiency    Port-A-Cath in place    Seizures [facility])    Spinal stenosis    Ulcer    Urinary incontinence     Past Surgical History:  Procedure Laterality Date   ABDOMINAL HYSTERECTOMY     BACK SURGERY     due to polio   BREAST BIOPSY Bilateral    neg   BREAST BIOPSY Right 2011   neg/stereo   CARPAL TUNNEL RELEASE     CATARACT EXTRACTION Right 2020   COLONOSCOPY WITH PROPOFOL N/A 04/04/2021   Procedure: COLONOSCOPY WITH PROPOFOL;  Surgeon: Virgel Manifold, MD;  Location: [facility] ENDOSCOPY;  Service: Endoscopy;  Laterality: N/A;   EYE SURGERY     IR IMAGING GUIDED PORT INSERTION  10/09/2021     Current Facility-Administered Medications:    0.9 %  sodium chloride infusion, , Intravenous, Continuous, Caryn Bee, MD, Last Rate: 75 mL/hr at 06/24/22 1938, New Bag at 06/24/22 1938   acetaminophen (TYLENOL) tablet 650 mg, 650 mg, Oral, Q6H PRN **OR** acetaminophen (TYLENOL) suppository 650 mg, 650 mg, Rectal, Q6H PRN, Caryn Bee, MD   ALPRAZolam Duanne Moron) tablet 0.5 mg, 0.5 mg, Oral, BID PRN, Caryn Bee, MD, 0.5 mg at 06/24/22 1853   atorvastatin (LIPITOR) tablet 10 mg, 10 mg, Oral, Daily, Caryn Bee, MD, 10 mg at  06/24/22 1851   busPIRone (BUSPAR) tablet 5 mg, 5 mg, Oral, TID, Dwyane Dee, Lonia Farber, MD   cyanocobalamin (VITAMIN B12) tablet 500 mcg, 500 mcg, Oral, Daily, Caryn Bee, MD, 500 mcg at 06/24/22 1644   docusate sodium (COLACE) capsule 100 mg, 100 mg, Oral, BID, Caryn Bee, MD, 100 mg at 06/24/22 1644   enoxaparin (LOVENOX) injection 40 mg, 40 mg, Subcutaneous, Q24H, Dwyane Dee, Lonia Farber, MD   FLUoxetine (PROZAC) capsule 60 mg, 60 mg, Oral, q morning, Caryn Bee, MD, 60 mg at 06/24/22 1851   hydrALAZINE (APRESOLINE) injection 10 mg, 10  mg, Intravenous, Q4H PRN, Caryn Bee, MD   meclizine (ANTIVERT) tablet 12.5 mg, 12.5 mg, Oral, TID PRN, Caryn Bee, MD   methocarbamol (ROBAXIN) tablet 500 mg, 500 mg, Oral, Q8H PRN, Caryn Bee, MD   morphine (PF) 2 MG/ML injection 2 mg, 2 mg, Intravenous, Q2H PRN, Caryn Bee, MD   ondansetron (ZOFRAN) tablet 4 mg, 4 mg, Oral, Q6H PRN **OR** ondansetron (ZOFRAN) injection 4 mg, 4 mg, Intravenous, Q6H PRN, Caryn Bee, MD   oxyCODONE (Oxy IR/ROXICODONE) immediate release tablet 5 mg, 5 mg, Oral, Q4H PRN, Caryn Bee, MD   pantoprazole (PROTONIX) injection 40 mg, 40 mg, Intravenous, Once, Caryn Bee, MD   Derrill Memo ON 06/25/2022] pantoprazole (PROTONIX) injection 40 mg, 40 mg, Intravenous, Q24H, Kumar, Lonia Farber, MD   polyethylene glycol powder (GLYCOLAX/MIRALAX) container 17 g, 17 g, Oral, Daily, Caryn Bee, MD   [START ON 06/25/2022] potassium chloride SA (KLOR-CON M) CR tablet 20 mEq, 20 mEq, Oral, Daily, Caryn Bee, MD   prochlorperazine (COMPAZINE) tablet 10 mg, 10 mg, Oral, Q6H PRN, Caryn Bee, MD   [START ON 06/25/2022] triamterene-hydrochlorothiazide (MAXZIDE-25) 37.5-25 MG per tablet 1 tablet, 1 tablet, Oral, Daily, Caryn Bee, MD   [START ON 06/25/2022] vitamin E capsule 400 Units, 400 Units, Oral, Daily, Caryn Bee, MD  Facility-Administered Medications Ordered in Other Encounters:    heparin lock flush 100 UNIT/ML injection, , , ,    palonosetron (ALOXI) 0.25 MG/5ML injection, , , ,    prochlorperazine (COMPAZINE) 10 MG tablet, , , ,    Family History  Problem Relation Age of Onset   Arthritis Mother    Heart disease Mother    Stroke Mother    Hypertension Mother    COPD Mother    Sudden death Sister    Other Father        unknown medical history   Breast cancer Neg Hx      Social History   Tobacco Use   Smoking status:  Never   Smokeless tobacco: Never  Vaping Use   Vaping Use: Never used  Substance Use Topics   Alcohol use: Never   Drug use: No    Allergies as of 06/24/2022 - Review Complete 06/24/2022  Allergen Reaction Noted   Penicillins  02/06/2011    Review of Systems:    All systems reviewed and negative except where noted in HPI.   Physical Exam:  Vital signs in last 24 hours: Temp:  [98 F (36.7 C)-98.3 F (36.8 C)] 98.2 F (36.8 C) (09/20 2133) Pulse Rate:  [71-82] 79 (09/20 2133) Resp:  [18-22] 18 (09/20 2133) BP: (124-138)/(62-71) 124/63 (09/20 2133) SpO2:  [94 %-99 %] 99 % (09/20 2133) Weight:  [69.5 kg] 69.5 kg (09/20 1110)   General:   Pleasant, cooperative in NAD, ill-appearing Head:  Normocephalic and atraumatic. Eyes:  No icterus.   Conjunctiva pink. PERRLA. Ears:  Normal auditory acuity. Neck:  Supple; no masses or thyroidomegaly Lungs: Respirations even and unlabored. Lungs clear to auscultation bilaterally.   No wheezes, crackles, or rhonchi.  Heart:  Regular rate and rhythm;  Without murmur, clicks, rubs or gallops Abdomen:  Soft, nondistended, nontender. Normal bowel sounds. No appreciable masses or hepatomegaly.  No rebound or guarding.  Rectal:  Not performed. Msk:  Symmetrical without gross deformities.  Strength generalized weakness Extremities:  Without edema, cyanosis or clubbing. Neurologic:  Alert and oriented x3;  grossly normal neurologically. Skin:  Intact without significant lesions or rashes. Psych:  Alert and cooperative. Normal affect.  LAB RESULTS:    Latest Ref Rng & Units 06/24/2022   11:44 AM 06/17/2022    9:31 AM 05/27/2022    9:11 AM  CBC  WBC 4.0 - 10.5 K/uL 10.7  8.2  10.1   Hemoglobin 12.0 - 15.0 g/dL 9.9  10.6  11.0   Hematocrit 36.0 - 46.0 % 32.1  33.9  35.3   Platelets 150 - 400 K/uL 93  251  142     BMET    Latest Ref Rng & Units 06/24/2022   11:44 AM 06/17/2022    9:31 AM 05/27/2022    9:11 AM  BMP  Glucose 70 - 99 mg/dL  94  98  124   BUN 8 - 23 mg/dL 12  14  10    Creatinine 0.44 - 1.00 mg/dL 0.71  0.95  0.80   Sodium 135 - 145 mmol/L 140  141  137   Potassium 3.5 - 5.1 mmol/L 3.7  3.0  3.6   Chloride 98 - 111 mmol/L 102  105  104   CO2 22 - 32 mmol/L 29  30  27    Calcium 8.9 - 10.3 mg/dL 9.0  9.1  9.3     LFT    Latest Ref Rng & Units 06/24/2022   11:44 AM 06/17/2022    9:31 AM 05/27/2022    9:11 AM  Hepatic Function  Total Protein 6.5 - 8.1 g/dL 7.0  7.0  7.5   Albumin 3.5 - 5.0 g/dL 3.3  3.4  3.5   AST 15 - 41 U/L 24  24  32   ALT 0 - 44 U/L 12  14  20    Alk Phosphatase 38 - 126 U/L 122  88  105   Total Bilirubin 0.3 - 1.2 mg/dL 0.9  0.6  0.4      STUDIES: CT ABDOMEN PELVIS W CONTRAST  Result Date: 06/24/2022 CLINICAL DATA:  Pain right lower quadrant, pelvic pain, vomiting, metastatic colon carcinoma EXAM: CT ABDOMEN AND PELVIS WITH CONTRAST TECHNIQUE: Multidetector CT imaging of the abdomen and pelvis was performed using the standard protocol following bolus administration of intravenous contrast. RADIATION DOSE REDUCTION: This exam was performed according to the departmental dose-optimization program which includes automated exposure control, adjustment of the mA and/or kV according to patient size and/or use of iterative reconstruction technique. CONTRAST:  137m OMNIPAQUE IOHEXOL 300 MG/ML  SOLN COMPARISON:  Previous studies including the examination of 04/13/2022 FINDINGS: Lower chest: Small patchy infiltrates are seen in lower lung fields, more so in the left lower lobe. There is calcified granuloma in left lower lung field. Hepatobiliary: There are a few low-density lesions in liver largest measuring 2 cm with no significant interval change. There is fatty infiltration in liver. Gallbladder is distended. There is no wall thickening. There is no dilation of  bile ducts. Pancreas: No focal abnormalities are seen. Spleen: Unremarkable. Adrenals/Urinary Tract: Adrenals are unremarkable. There is no  hydronephrosis. 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 There is 2 mm calculus in the right kidney. There is 2 mm calculus in the left kidney. Urinary bladder is distended. Diverticulum is noted in the left posterolateral wall of the bladder. Stomach/Bowel: Stomach is not distended. Small bowel loops are not dilated. Appendix is not seen. There is no pericecal inflammation. Wall thickening in cecum appears less prominent. In the current study, maximum wall thickness in the medial aspect of the cecum measures 12 mm. Few scattered diverticula are seen in colon without signs of focal acute diverticulitis. Vascular/Lymphatic: Scattered arterial calcifications are seen. Reproductive: Uterus is not seen. There is 5.5 x 4.2 cm inhomogeneous soft tissue mass and fluid density in vaginal cuff with no significant interval change. Small amount of fluid is seen in the vaginal canal. Other: There is no ascites or pneumoperitoneum. Small umbilical hernia containing fat is seen. Musculoskeletal: There is minimal anterolisthesis at L4-L5 level. There is severe spinal stenosis and encroachment of neural foramina at L4-L5 level. There is encroachment of neural foramina at L5-S1 level, more so on the right side. There is partial fusion of bodies of L5 and S1 vertebrae. IMPRESSION: There are low-density lesions in liver consistent with metastatic disease with no significant change. Wall thickening in the cecum appears less prominent. There is 5.5 x 4.2 cm mixed density lesion in vaginal cuff suggesting possible metastatic disease. There is no evidence of intestinal obstruction or pneumoperitoneum. There is no hydronephrosis. Small patchy infiltrates are seen in the lower lung fields suggesting atelectasis/pneumonia. Small bilateral renal stones. Diverticulosis of colon without signs of diverticulitis. 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 There is encroachment of  neural foramina at L4-L5 and L5-S1 levels. Other findings as described in the body of the report. Electronically Signed   By: Elmer Picker M.D.   On: 06/24/2022 14:27   DG Chest 2 View  Result Date: 06/24/2022 CLINICAL DATA:  Dysphagia. EXAM: CHEST - 2 VIEW COMPARISON:  Chest x-ray 06/17/2021. FINDINGS: Similar mild enlargement the cardiac silhouette. Left IJ approach Port-A-Cath with the tip projecting at the superior right atrium. No consolidation. No visible pleural effusions or pneumothorax. Partially imaged ACDF. Polyarticular degenerative change. 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 IMPRESSION: No evidence of acute cardiopulmonary disease. Electronically Signed   By: Margaretha Sheffield M.D.   On: 06/24/2022 12:18      Impression / Plan:   Chelsea [name] is a 76 y.o. 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 except ice chips and sips with meds If EGD is unremarkable, recommend CT chest  I have discussed alternative options, risks & benefits,  which include, but are not limited to, bleeding, infection, perforation,respiratory complication & drug reaction.  The patient agrees with this plan & written consent will be obtained.     Thank you for involving me in the care of this patient.      LOS: 0 days   Sherri Sear, MD  06/24/2022, 9:48 PM    Note: This dictation was prepared with Dragon dictation along with smaller phrase technology. Any  transcriptional errors that result from this process are unintentional.

## 2022-06-25 ENCOUNTER — Inpatient Hospital Stay: Payer: Medicare HMO

## 2022-06-25 ENCOUNTER — Inpatient Hospital Stay: Payer: Medicare HMO | Admitting: Certified Registered"

## 2022-06-25 ENCOUNTER — Encounter
Admission: EM | Disposition: A | Discharge status: Home / Self Care | Payer: Self-pay | Source: Home / Self Care | Attending: Internal Medicine

## 2022-06-25 DIAGNOSIS — C189 Malignant neoplasm of colon, unspecified: Secondary | ICD-10-CM | POA: Diagnosis not present

## 2022-06-25 DIAGNOSIS — K259 Gastric ulcer, unspecified as acute or chronic, without hemorrhage or perforation: Secondary | ICD-10-CM | POA: Diagnosis not present

## 2022-06-25 DIAGNOSIS — R1310 Dysphagia, unspecified: Secondary | ICD-10-CM

## 2022-06-25 DIAGNOSIS — D649 Anemia, unspecified: Secondary | ICD-10-CM | POA: Diagnosis not present

## 2022-06-25 DIAGNOSIS — R109 Unspecified abdominal pain: Secondary | ICD-10-CM | POA: Diagnosis not present

## 2022-06-25 DIAGNOSIS — K253 Acute gastric ulcer without hemorrhage or perforation: Secondary | ICD-10-CM | POA: Diagnosis not present

## 2022-06-25 HISTORY — PX: ESOPHAGOGASTRODUODENOSCOPY (EGD) WITH PROPOFOL: SHX5813

## 2022-06-25 LAB — IRON AND TIBC
Iron: 64 ug/dL (ref 28–170)
Saturation Ratios: 28 % (ref 10.4–31.8)
TIBC: 228 ug/dL — ABNORMAL LOW (ref 250–450)
UIBC: 164 ug/dL

## 2022-06-25 LAB — CBC
HCT: 28.1 % — ABNORMAL LOW (ref 36.0–46.0)
Hemoglobin: 8.8 g/dL — ABNORMAL LOW (ref 12.0–15.0)
MCH: 31.4 pg (ref 26.0–34.0)
MCHC: 31.3 g/dL (ref 30.0–36.0)
MCV: 100.4 fL — ABNORMAL HIGH (ref 80.0–100.0)
Platelets: 78 10*3/uL — ABNORMAL LOW (ref 150–400)
RBC: 2.8 MIL/uL — ABNORMAL LOW (ref 3.87–5.11)
RDW: 16.5 % — ABNORMAL HIGH (ref 11.5–15.5)
WBC: 9.2 10*3/uL (ref 4.0–10.5)
nRBC: 0 % (ref 0.0–0.2)

## 2022-06-25 LAB — BASIC METABOLIC PANEL
Anion gap: 7 (ref 5–15)
BUN: 9 mg/dL (ref 8–23)
CO2: 26 mmol/L (ref 22–32)
Calcium: 8.5 mg/dL — ABNORMAL LOW (ref 8.9–10.3)
Chloride: 110 mmol/L (ref 98–111)
Creatinine, Ser: 0.61 mg/dL (ref 0.44–1.00)
GFR, Estimated: >60 mL/min (ref >60)
Glucose, Bld: 80 mg/dL (ref 70–99)
Potassium: 3.3 mmol/L — ABNORMAL LOW (ref 3.5–5.1)
Sodium: 143 mmol/L (ref 135–145)

## 2022-06-25 LAB — VITAMIN B12: Vitamin B-12: 4033 pg/mL — ABNORMAL HIGH (ref 180–914)

## 2022-06-25 LAB — FERRITIN: Ferritin: 325 ng/mL — ABNORMAL HIGH (ref 11–307)

## 2022-06-25 SURGERY — ESOPHAGOGASTRODUODENOSCOPY (EGD) WITH PROPOFOL
Anesthesia: General

## 2022-06-25 MED ORDER — PROPOFOL 10 MG/ML IV BOLUS
INTRAVENOUS | Status: DC | PRN
  Administered 2022-06-25: 50 mg via INTRAVENOUS

## 2022-06-25 MED ORDER — MAGIC MOUTHWASH W/LIDOCAINE
10.0000 mL | Freq: Four times a day (QID) | ORAL | Status: DC
  Administered 2022-06-25 – 2022-06-26 (×6): 10 mL via ORAL
  Filled 2022-06-25 (×7): qty 10

## 2022-06-25 MED ORDER — PROPOFOL 500 MG/50ML IV EMUL
INTRAVENOUS | Status: DC | PRN
  Administered 2022-06-25: 150 ug/kg/min via INTRAVENOUS

## 2022-06-25 MED ORDER — LIDOCAINE HCL (CARDIAC) PF 100 MG/5ML IV SOSY
PREFILLED_SYRINGE | INTRAVENOUS | Status: DC | PRN
  Administered 2022-06-25: 80 mg via INTRAVENOUS

## 2022-06-25 MED ORDER — POTASSIUM CHLORIDE 10 MEQ/100ML IV SOLN
10.0000 meq | INTRAVENOUS | Status: AC
  Administered 2022-06-25 (×2): 10 meq via INTRAVENOUS
  Filled 2022-06-25 (×2): qty 100

## 2022-06-25 MED ORDER — ADULT MULTIVITAMIN W/MINERALS CH
1.0000 | ORAL_TABLET | Freq: Every day | ORAL | Status: DC
  Administered 2022-06-26: 1 via ORAL
  Filled 2022-06-25: qty 1

## 2022-06-25 MED ORDER — LACTATED RINGERS IV SOLN: INTRAVENOUS | Status: DC

## 2022-06-25 MED ORDER — SUCRALFATE 1 GM/10ML PO SUSP
1.0000 g | Freq: Three times a day (TID) | ORAL | Status: DC
  Administered 2022-06-25 – 2022-06-26 (×4): 1 g via ORAL
  Filled 2022-06-25 (×4): qty 10

## 2022-06-25 MED ORDER — IOHEXOL 300 MG/ML  SOLN
75.0000 mL | Freq: Once | INTRAMUSCULAR | Status: AC | PRN
  Administered 2022-06-25: 75 mL via INTRAVENOUS

## 2022-06-25 MED ORDER — ENSURE ENLIVE PO LIQD
237.0000 mL | Freq: Three times a day (TID) | ORAL | Status: DC
  Administered 2022-06-25: 237 mL via ORAL

## 2022-06-25 MED ORDER — LIDOCAINE 5 % EX PTCH
1.0000 | MEDICATED_PATCH | CUTANEOUS | Status: DC
  Administered 2022-06-25 – 2022-06-26 (×2): 1 via TRANSDERMAL
  Filled 2022-06-25 (×2): qty 1

## 2022-06-25 NOTE — Hospital Course (Signed)
Chelsea [name] is a 76 y.o. 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 Tasia Catchings, oncologist, essential hypertension, chronic pain/neuropathy, degenerative disc disease, depression with anxiety now presents to emergency department with 2 to 3-day history of difficulty in swallowing and 1 day history of abdominal pain.  She also complaining of pain with swallowing. EGD was performed on 9/21, showed erosive gastritis, no evidence of esophageal abnormality.

## 2022-06-25 NOTE — Op Note (Signed)
[facility] Gastroenterology Patient Name: Chelsea [name] Procedure Date: 06/25/2022 12:09 PM MRN: [id number] Account #: [id number] Date of Birth: 1945-12-19 Admit Type: Inpatient Age: 76 Room: [facility] ENDO ROOM 1 Gender: Female Note Status: Finalized Instrument Name: Upper Endoscope [id number] Procedure:             Upper GI endoscopy Indications:           Esophageal dysphagia, Odynophagia Providers:             Lin Landsman MD, MD Referring MD:          Jearld Fenton (Referring MD) Medicines:             General Anesthesia Complications:         No immediate complications. Estimated blood loss: None. Procedure:             Pre-Anesthesia Assessment:                        - Prior to the procedure, a History and Physical was                         performed, and patient medications and allergies were                         reviewed. The patient is competent. The risks and                         benefits of the procedure and the sedation options and                         risks were discussed with the patient. All questions                         were answered and informed consent was obtained.                         Patient identification and proposed procedure were                         verified by the physician, the nurse, the                         anesthesiologist, the anesthetist and the technician                         in the pre-procedure area in the procedure room in the                         endoscopy suite. Mental Status Examination: alert and                         oriented. Airway Examination: normal oropharyngeal                         airway and neck mobility. Respiratory Examination:                         clear to auscultation. CV Examination: normal.  Prophylactic Antibiotics: The patient does not require                         prophylactic antibiotics. Prior Anticoagulants: The                          patient has taken no previous anticoagulant or                         antiplatelet agents. ASA Grade Assessment: III - A                         patient with severe systemic disease. After reviewing                         the risks and benefits, the patient was deemed in                         satisfactory condition to undergo the procedure. The                         anesthesia plan was to use general anesthesia.                         Immediately prior to administration of medications,                         the patient was re-assessed for adequacy to receive                         sedatives. The heart rate, respiratory rate, oxygen                         saturations, blood pressure, adequacy of pulmonary                         ventilation, and response to care were monitored                         throughout the procedure. The physical status of the                         patient was re-assessed after the procedure.                        After obtaining informed consent, the endoscope was                         passed under direct vision. Throughout the procedure,                         the patient's blood pressure, pulse, and oxygen                         saturations were monitored continuously. The Endoscope                         was introduced through the mouth, and advanced to the  second part of duodenum. The upper GI endoscopy was                         accomplished without difficulty. The patient tolerated                         the procedure well. Findings:      The duodenal bulb and second portion of the duodenum were normal.      Multiple dispersed small erosions with no bleeding and no stigmata of       recent bleeding were found in the gastric body. Biopsies were taken with       a cold forceps for Helicobacter pylori testing.      The incisura and gastric antrum were normal. Biopsies were taken with a       cold forceps for  Helicobacter pylori testing.      The cardia and gastric fundus were normal on retroflexion.      Esophagogastric landmarks were identified: the gastroesophageal junction       was found at 37 cm from the incisors.      The gastroesophageal junction and examined esophagus were normal. Impression:            - Normal duodenal bulb and second portion of the                         duodenum.                        - Erosive gastropathy with no bleeding and no stigmata                         of recent bleeding. Biopsied.                        - Normal incisura and antrum. Biopsied.                        - Esophagogastric landmarks identified.                        - Normal gastroesophageal junction and esophagus. Recommendation:        - Return patient to hospital ward for ongoing care.                        - Advance diet as tolerated.                        - Continue present medications.                        - Await pathology results.                        - Use a proton pump inhibitor PO daily indefinitely. Procedure Code(s):     --- Professional ---                        [phone], Esophagogastroduodenoscopy, flexible,                         transoral; with biopsy, single or multiple  Diagnosis Code(s):     --- Professional ---                        K31.89, Other diseases of stomach and duodenum                        R13.14, Dysphagia, pharyngoesophageal phase CPT copyright 2019 [employer]. All rights reserved. The codes documented in this report are preliminary and upon coder review may  be revised to meet current compliance requirements. Dr. Ulyess Mort Lin Landsman MD, MD 06/25/2022 12:23:25 PM This report has been signed electronically. Number of Addenda: 0 Note Initiated On: 06/25/2022 12:09 PM Estimated Blood Loss:  Estimated blood loss: none.      [facility]

## 2022-06-25 NOTE — Anesthesia Preprocedure Evaluation (Signed)
Anesthesia Evaluation  Patient identified by MRN, date of birth, ID band Patient awake    Reviewed: Allergy & Precautions, NPO status , Patient's Chart, lab work & pertinent test results  Airway Mallampati: II  TM Distance: >3 FB Neck ROM: Limited    Dental  (+) Teeth Intact   Pulmonary neg pulmonary ROS, sleep apnea ,    Pulmonary exam normal breath sounds clear to auscultation       Cardiovascular Exercise Tolerance: Poor hypertension, Pt. on medications + DOE  negative cardio ROS Normal cardiovascular exam Rhythm:Regular     Neuro/Psych Seizures -,  Anxiety Depression negative neurological ROS  negative psych ROS   GI/Hepatic negative GI ROS, Neg liver ROS, Colon ca with mets to liver and possible pelvis   Endo/Other  negative endocrine ROS  Renal/GU negative Renal ROS  negative genitourinary   Musculoskeletal   Abdominal Normal abdominal exam  (+)   Peds  Hematology negative hematology ROS (+) Blood dyscrasia, anemia ,   Anesthesia Other Findings Past Medical History: No date: Anxiety No date: Arthritis No date: Depression No date: Hyperlipidemia No date: Hypertension No date: Liver mass No date: Metastatic colon cancer to liver (HCC) No date: Moderate mitral insufficiency No date: Port-A-Cath in place No date: Seizures ([place]) No date: Spinal stenosis No date: Ulcer No date: Urinary incontinence  Past Surgical History: No date: ABDOMINAL HYSTERECTOMY No date: BACK SURGERY     Comment:  due to polio No date: BREAST BIOPSY; Bilateral     Comment:  neg 2011: BREAST BIOPSY; Right     Comment:  neg/stereo No date: CARPAL TUNNEL RELEASE 2020: CATARACT EXTRACTION; Right 04/04/2021: COLONOSCOPY WITH PROPOFOL; N/A     Comment:  Procedure: COLONOSCOPY WITH PROPOFOL;  Surgeon:               Virgel Manifold, MD;  Location: [facility] ENDOSCOPY;                Service: Endoscopy;  Laterality: N/A; No date:  EYE SURGERY 10/09/2021: IR IMAGING GUIDED PORT INSERTION  BMI    Body Mass Index: 33.16 kg/m      Reproductive/Obstetrics negative OB ROS                             Anesthesia Physical Anesthesia Plan  ASA: 3  Anesthesia Plan: General   Post-op Pain Management:    Induction: Intravenous  PONV Risk Score and Plan: Propofol infusion and TIVA  Airway Management Planned: Natural Airway  Additional Equipment:   Intra-op Plan:   Post-operative Plan:   Informed Consent: I have reviewed the patients History and Physical, chart, labs and discussed the procedure including the risks, benefits and alternatives for the proposed anesthesia with the patient or authorized representative who has indicated his/her understanding and acceptance.     Dental Advisory Given  Plan Discussed with: CRNA and Surgeon  Anesthesia Plan Comments:         Anesthesia Quick Evaluation

## 2022-06-25 NOTE — Consult Note (Signed)
Hematology/Oncology Consult note [phone] [phone]      Patient Care Team: Jearld Fenton, NP as PCP - General (Internal Medicine) Clent Jacks, RN as Oncology Nurse Navigator [name], MD as Consulting Physician (Oncology)   Name of the patient: Chelsea [name]  [id number]  July 29, 1946   REASON FOR COSULTATION:  Colon cancer History of presenting illness-  76 y.o. female with PMH listed at below who presents to ER due to swallowing difficulties for 3 days, nausea with 1 episode of vomiting, upper abdominal pain without any radiation.,  No diarrhea, fever, chills. Patient is known to me for metastatic colon cancer and last received treatment on 06/17/2022. 06/25/2022, CT abdomen pelvis with contrast showed low-density lesions in the liver consistent with metastatic disease with no significant changes.  5.5 x 4.2 cm mixed density lesion in vaginal cuff.  No evidence of obstruction, hydronephrosis.  Patchy infiltrates seen both lower lungs suggesting atelectasis/pneumonia.  Small bilateral renal stones.  Diverticulosis, Lumbar spondylosis with severe spinal stenosis at L4-L5 level. There is encroachment of neural foramina at L4-L5 and L5-S1 levels.  #06/25/2022 EGD showed erosive gastropathy with no bleeding and no stigmata of recent bleeding.  Biopsied. Findings does not explain patient's dysphagia symptoms. Oncology was consulted for further evaluation management.  Patient was seen at bedside. No nausea vomiting.  Not able to eat well due to dysphagia. + Acid reflux.  Abdominal pain has improved.   Allergies  Allergen Reactions   Penicillins     "Everything turned black"    Patient Active Problem List   Diagnosis Date Noted   Metastatic colon cancer to liver ([place]) 09/16/2021    Priority: High   Chemotherapy induced diarrhea 06/17/2022    Priority: Medium    Anemia due to antineoplastic chemotherapy 06/17/2022    Priority: Medium    Encounter  for antineoplastic chemotherapy 05/13/2022    Priority: Medium    Neuropathy 03/25/2022    Priority: Medium    Chemotherapy induced neutropenia ([place]) 03/25/2022    Priority: Medium    Hypokalemia 11/17/2021    Priority: Low   Weight loss 11/17/2021    Priority: Low   Gastric erosion    Dysphagia 06/24/2022   Dehydration 06/24/2022   Pancytopenia ([place]) 06/24/2022   Thrombocytopenia ([place]) 06/24/2022   Odynophagia    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   OSA (obstructive sleep apnea) 04/02/2021   Stage 3a chronic kidney disease ([place]) 03/25/2021   Aortic atherosclerosis ([place]) 03/25/2021   Class 2 obesity due to excess calories without serious comorbidity with body mass index (BMI) of 36.0 to 36.9 in adult 03/16/2021   Mixed incontinence 01/28/2021   Chronic anemia 09/26/2018   Depression, major, recurrent, in partial remission ([place]) 01/11/2018   Pure hypercholesterolemia 01/11/2018   GAD (generalized anxiety disorder) 05/14/2011   Essential hypertension 02/06/2011   Spinal stenosis of lumbosacral region 02/06/2011     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)    Moderate mitral insufficiency    Port-A-Cath in place    Seizures [facility])    Spinal stenosis    Ulcer    Urinary incontinence      Past Surgical History:  Procedure Laterality Date   ABDOMINAL HYSTERECTOMY     BACK SURGERY     due to polio   BREAST  BIOPSY Bilateral    neg   BREAST BIOPSY Right 2011   neg/stereo   CARPAL TUNNEL RELEASE     CATARACT EXTRACTION Right 2020   COLONOSCOPY WITH PROPOFOL N/A 04/04/2021   Procedure: COLONOSCOPY WITH PROPOFOL;  Surgeon: Virgel Manifold, MD;  Location: [facility] ENDOSCOPY;  Service: Endoscopy;  Laterality: N/A;   EYE SURGERY     IR IMAGING GUIDED PORT INSERTION  10/09/2021    Social History   Socioeconomic History   Marital status: Divorced     Spouse name: Not on file   Number of children: Not on file   Years of education: Not on file   Highest education level: Not on file  Occupational History   Not on file  Tobacco Use   Smoking status: Never   Smokeless tobacco: Never  Vaping Use   Vaping Use: Never used  Substance and Sexual Activity   Alcohol use: Never   Drug use: No   Sexual activity: Not Currently  Other Topics Concern   Not on file  Social History Narrative   Not on file   Social Determinants of Health   Financial Resource Strain: Low Risk  (01/12/2022)   Overall Financial Resource Strain (CARDIA)    Difficulty of Paying Living Expenses: Not very hard  Food Insecurity: No Food Insecurity (06/24/2022)   Hunger Vital Sign    Worried About Running Out of Food in the Last Year: Never true    Ran Out of Food in the Last Year: Never true  Transportation Needs: No Transportation Needs (06/24/2022)   PRAPARE - Hydrologist (Medical): No    Lack of Transportation (Non-Medical): No  Physical Activity: Inactive (01/12/2022)   Exercise Vital Sign    Days of Exercise per Week: 0 days    Minutes of Exercise per Session: 0 min  Stress: Stress Concern Present (01/12/2022)   [place]    Feeling of Stress : To some extent  Social Connections: Moderately Integrated (01/12/2022)   Social Connection and Isolation Panel [NHANES]    Frequency of Communication with Friends and Family: Three times a week    Frequency of Social Gatherings with Friends and Family: Three times a week    Attends Religious Services: 1 to 4 times per year    Active Member of Clubs or Organizations: No    Attends Archivist Meetings: 1 to 4 times per year    Marital Status: Widowed  Intimate Partner Violence: Not At Risk (06/24/2022)   Humiliation, Afraid, Rape, and Kick questionnaire    Fear of Current or Ex-Partner: No    Emotionally Abused: No     Physically Abused: No    Sexually Abused: No     Family History  Problem Relation Age of Onset   Arthritis Mother    Heart disease Mother    Stroke Mother    Hypertension Mother    COPD Mother    Sudden death Sister    Other Father        unknown medical history   Breast cancer Neg Hx      Current Facility-Administered Medications:    acetaminophen (TYLENOL) tablet 650 mg, 650 mg, Oral, Q6H PRN, 650 mg at 06/25/22 1338 **OR** acetaminophen (TYLENOL) suppository 650 mg, 650 mg, Rectal, Q6H PRN, Caryn Bee, MD   ALPRAZolam Duanne Moron) tablet 0.5 mg, 0.5 mg, Oral, BID PRN, Caryn Bee, MD, 0.5 mg at 06/25/22  1032   atorvastatin (LIPITOR) tablet 10 mg, 10 mg, Oral, Daily, Caryn Bee, MD, 10 mg at 06/25/22 1021   busPIRone (BUSPAR) tablet 5 mg, 5 mg, Oral, TID, Caryn Bee, MD, 5 mg at 06/25/22 1556   cyanocobalamin (VITAMIN B12) tablet 500 mcg, 500 mcg, Oral, Daily, Caryn Bee, MD, 500 mcg at 06/25/22 1022   docusate sodium (COLACE) capsule 100 mg, 100 mg, Oral, BID, Caryn Bee, MD, 100 mg at 06/25/22 1021   enoxaparin (LOVENOX) injection 40 mg, 40 mg, Subcutaneous, Q24H, Caryn Bee, MD, 40 mg at 06/24/22 2202   feeding supplement (ENSURE ENLIVE / ENSURE PLUS) liquid 237 mL, 237 mL, Oral, TID BM, Sharen Hones, MD   FLUoxetine (PROZAC) capsule 60 mg, 60 mg, Oral, q morning, Caryn Bee, MD, 60 mg at 06/25/22 1020   hydrALAZINE (APRESOLINE) injection 10 mg, 10 mg, Intravenous, Q4H PRN, Caryn Bee, MD   lactated ringers infusion, , Intravenous, Continuous, Sharen Hones, MD, Last Rate: 75 mL/hr at 06/25/22 1330, New Bag at 06/25/22 1330   lidocaine (LIDODERM) 5 % 1 patch, 1 patch, Transdermal, Q24H, Sharen Hones, MD, 1 patch at 06/25/22 1554   magic mouthwash w/lidocaine, 10 mL, Oral, QID, Sharen Hones, MD, 10 mL at 06/25/22 1556   meclizine (ANTIVERT) tablet 12.5 mg, 12.5 mg,  Oral, TID PRN, Caryn Bee, MD   methocarbamol (ROBAXIN) tablet 500 mg, 500 mg, Oral, Q8H PRN, Caryn Bee, MD, 500 mg at 06/25/22 1324   morphine (PF) 2 MG/ML injection 2 mg, 2 mg, Intravenous, Q2H PRN, Caryn Bee, MD   Derrill Memo ON 06/26/2022] multivitamin with minerals tablet 1 tablet, 1 tablet, Oral, Daily, Sharen Hones, MD   ondansetron [facility]) tablet 4 mg, 4 mg, Oral, Q6H PRN **OR** ondansetron (ZOFRAN) injection 4 mg, 4 mg, Intravenous, Q6H PRN, Caryn Bee, MD   oxyCODONE (Oxy IR/ROXICODONE) immediate release tablet 5 mg, 5 mg, Oral, Q4H PRN, Caryn Bee, MD, 5 mg at 06/25/22 0846   pantoprazole (PROTONIX) injection 40 mg, 40 mg, Intravenous, Q12H, [place], Tally Due, MD, 40 mg at 06/25/22 1019   polyethylene glycol powder (GLYCOLAX/MIRALAX) container 17 g, 17 g, Oral, Daily, Caryn Bee, MD   prochlorperazine (COMPAZINE) tablet 10 mg, 10 mg, Oral, Q6H PRN, Caryn Bee, MD   sucralfate (CARAFATE) 1 GM/10ML suspension 1 g, 1 g, Oral, TID WC & HS, Sharen Hones, MD   triamterene-hydrochlorothiazide (MAXZIDE-25) 37.5-25 MG per tablet 1 tablet, 1 tablet, Oral, Daily, Caryn Bee, MD, 1 tablet at 06/25/22 1023   vitamin E capsule 400 Units, 400 Units, Oral, Daily, Caryn Bee, MD, 400 Units at 06/25/22 1023  Facility-Administered Medications Ordered in Other Encounters:    heparin lock flush 100 UNIT/ML injection, , , ,    palonosetron (ALOXI) 0.25 MG/5ML injection, , , ,    prochlorperazine (COMPAZINE) 10 MG tablet, , , ,   Review of Systems  Constitutional:  Positive for fatigue. Negative for appetite change, chills and fever.  HENT:   Negative for hearing loss and voice change.   Eyes:  Negative for eye problems.  Respiratory:  Negative for chest tightness and cough.   Cardiovascular:  Negative for chest pain.  Gastrointestinal:  Negative for abdominal distention, abdominal pain and  blood in stool.       Dysphagia  Endocrine: Negative for hot flashes.  Genitourinary:  Negative for difficulty urinating and frequency.   Musculoskeletal:  Negative for arthralgias.  Skin:  Negative for itching and rash.  Neurological:  Negative for extremity weakness.  Hematological:  Negative for adenopathy.  Psychiatric/Behavioral:  Negative for confusion.     PHYSICAL EXAM Vitals:   06/25/22 1226 06/25/22 1228 06/25/22 1246 06/25/22 1256  BP: (!) 87/59 126/71 137/71 135/66  Pulse: 66 70 69 71  Resp: '20 11 12 15  '$ Temp: 97.9 F (36.6 C)     TempSrc: Temporal     SpO2: 100% 100% 98% 97%  Weight:      Height:       Physical Exam Constitutional:      Appearance: She is obese.  HENT:     Head: Normocephalic.  Eyes:     General: No scleral icterus. Cardiovascular:     Rate and Rhythm: Normal rate.  Pulmonary:     Effort: Pulmonary effort is normal. No respiratory distress.  Abdominal:     General: There is no distension.     Tenderness: There is no abdominal tenderness.  Musculoskeletal:        General: No deformity. Normal range of motion.     Cervical back: Normal range of motion.  Skin:    General: Skin is warm.  Neurological:     Mental Status: She is alert and oriented to person, place, and time. Mental status is at baseline.  Psychiatric:        Mood and Affect: Mood normal.       LABORATORY STUDIES    Latest Ref Rng & Units 06/25/2022    3:35 AM 06/24/2022   11:44 AM 06/17/2022    9:31 AM  CBC  WBC 4.0 - 10.5 K/uL 9.2  10.7  8.2   Hemoglobin 12.0 - 15.0 g/dL 8.8  9.9  10.6   Hematocrit 36.0 - 46.0 % 28.1  32.1  33.9   Platelets 150 - 400 K/uL 78  93  251       Latest Ref Rng & Units 06/25/2022    3:35 AM 06/24/2022   11:44 AM 06/17/2022    9:31 AM  CMP  Glucose 70 - 99 mg/dL 80  94  98   BUN 8 - 23 mg/dL '9  12  14   '$ Creatinine 0.44 - 1.00 mg/dL 0.61  0.71  0.95   Sodium 135 - 145 mmol/L 143  140  141   Potassium 3.5 - 5.1 mmol/L 3.3  3.7  3.0    Chloride 98 - 111 mmol/L 110  102  105   CO2 22 - 32 mmol/L '26  29  30   '$ Calcium 8.9 - 10.3 mg/dL 8.5  9.0  9.1   Total Protein 6.5 - 8.1 g/dL  7.0  7.0   Total Bilirubin 0.3 - 1.2 mg/dL  0.9  0.6   Alkaline Phos 38 - 126 U/L  122  88   AST 15 - 41 U/L  24  24   ALT 0 - 44 U/L  12  14      RADIOGRAPHIC STUDIES: I have personally reviewed the radiological images as listed and agreed with the findings in the report. CT ABDOMEN PELVIS W CONTRAST  Result Date: 06/24/2022 CLINICAL DATA:  Pain right lower quadrant, pelvic pain, vomiting, metastatic colon carcinoma EXAM: CT ABDOMEN AND PELVIS WITH CONTRAST TECHNIQUE: Multidetector CT imaging of the abdomen and pelvis was performed using the standard protocol following bolus administration of intravenous contrast. RADIATION DOSE REDUCTION: This exam was performed according to the departmental dose-optimization program which includes automated exposure control,  adjustment of the mA and/or kV according to patient size and/or use of iterative reconstruction technique. CONTRAST:  151m OMNIPAQUE IOHEXOL 300 MG/ML  SOLN COMPARISON:  Previous studies including the examination of 04/13/2022 FINDINGS: Lower chest: Small patchy infiltrates are seen in lower lung fields, more so in the left lower lobe. There is calcified granuloma in left lower lung field. Hepatobiliary: There are a few low-density lesions in liver largest measuring 2 cm with no significant interval change. There is fatty infiltration in liver. Gallbladder is distended. There is no wall thickening. There is no dilation of bile ducts. Pancreas: No focal abnormalities are seen. Spleen: Unremarkable. Adrenals/Urinary Tract: Adrenals are unremarkable. There is no hydronephrosis. 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 There is 2 mm calculus in the right kidney. There is 2 mm calculus in the left kidney. Urinary bladder is distended. Diverticulum is noted in  the left posterolateral wall of the bladder. Stomach/Bowel: Stomach is not distended. Small bowel loops are not dilated. Appendix is not seen. There is no pericecal inflammation. Wall thickening in cecum appears less prominent. In the current study, maximum wall thickness in the medial aspect of the cecum measures 12 mm. Few scattered diverticula are seen in colon without signs of focal acute diverticulitis. Vascular/Lymphatic: Scattered arterial calcifications are seen. Reproductive: Uterus is not seen. There is 5.5 x 4.2 cm inhomogeneous soft tissue mass and fluid density in vaginal cuff with no significant interval change. Small amount of fluid is seen in the vaginal canal. Other: There is no ascites or pneumoperitoneum. Small umbilical hernia containing fat is seen. Musculoskeletal: There is minimal anterolisthesis at L4-L5 level. There is severe spinal stenosis and encroachment of neural foramina at L4-L5 level. There is encroachment of neural foramina at L5-S1 level, more so on the right side. There is partial fusion of bodies of L5 and S1 vertebrae. IMPRESSION: There are low-density lesions in liver consistent with metastatic disease with no significant change. Wall thickening in the cecum appears less prominent. There is 5.5 x 4.2 cm mixed density lesion in vaginal cuff suggesting possible metastatic disease. There is no evidence of intestinal obstruction or pneumoperitoneum. There is no hydronephrosis. Small patchy infiltrates are seen in the lower lung fields suggesting atelectasis/pneumonia. Small bilateral renal stones. Diverticulosis of colon without signs of diverticulitis. 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 There is encroachment of neural foramina at L4-L5 and L5-S1 levels. Other findings as described in the body of the report. Electronically Signed   By: PElmer PickerM.D.   On: 06/24/2022 14:27   DG Chest 2 View  Result Date: 06/24/2022 CLINICAL DATA:   Dysphagia. EXAM: CHEST - 2 VIEW COMPARISON:  Chest x-ray 06/17/2021. FINDINGS: Similar mild enlargement the cardiac silhouette. Left IJ approach Port-A-Cath with the tip projecting at the superior right atrium. No consolidation. No visible pleural effusions or pneumothorax. Partially imaged ACDF. Polyarticular degenerative change. 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 IMPRESSION: No evidence of acute cardiopulmonary disease. Electronically Signed   By: FMargaretha SheffieldM.D.   On: 06/24/2022 12:18   CT CHEST ABDOMEN PELVIS W CONTRAST  Result Date: 04/13/2022 CLINICAL DATA:  Colon mass, liver cancer diagnosed December 2022. * Tracking Code: BO * EXAM: CT CHEST, ABDOMEN, AND PELVIS WITH CONTRAST TECHNIQUE: Multidetector CT imaging of the chest, abdomen and pelvis was performed following the standard protocol during bolus administration of intravenous contrast. RADIATION DOSE REDUCTION: This exam was performed according to the departmental dose-optimization program which  includes automated exposure control, adjustment of the mA and/or kV according to patient size and/or use of iterative reconstruction technique. CONTRAST:  167m OMNIPAQUE IOHEXOL 300 MG/ML  SOLN COMPARISON:  12/22/2021. FINDINGS: CT CHEST FINDINGS Cardiovascular: Left IJ Port-A-Cath terminates in the right atrium. Atherosclerotic calcification of the aorta and aortic valve. Heart is enlarged. No pericardial effusion. Mediastinum/Nodes: No pathologically enlarged mediastinal, hilar or axillary lymph nodes. Calcified mediastinal and hilar lymph nodes. Esophagus is unremarkable. Lungs/Pleura: Calcified granulomas. Calcified pleural plaques in the right hemithorax. New areas of ground-glass and consolidation in the right upper lobe, lingula and left lower lobe. No pleural fluid. Airway is unremarkable. Musculoskeletal: T8 and T9 vertebral body fusion with a kyphotic deformity. Degenerative changes in the spine. No worrisome lytic or  sclerotic lesions. CT ABDOMEN PELVIS FINDINGS Hepatobiliary: Liver is decreased in attenuation diffusely. Mildly hypoattenuating lesions in the liver measure up to 2.3 cm in the left hepatic lobe, unchanged. Gallbladder is mildly distended. No biliary ductal dilatation. Pancreas: Negative. Spleen: Negative. Adrenals/Urinary Tract: Adrenal glands are unremarkable. Scarring in the kidneys bilaterally. Bilateral renal stones. Ureters are decompressed. Left posterolateral bladder diverticulum. Bladder is otherwise grossly unremarkable. Stomach/Bowel: Stomach, small bowel and appendix are unremarkable. Eccentric irregular wall thickening in the cecum (2/68), similar. Stool is seen in the majority of the colon, indicative of constipation. Vascular/Lymphatic: Atherosclerotic calcification of the aorta. No pathologically enlarged lymph nodes. Gastrohepatic ligament lymph nodes are subcentimeter in short axis size. Reproductive: Hysterectomy. Heterogeneous mixed cystic and solid mass in the region of the vaginal cuff measures 3.8 x 5.6 cm, stable from 12/22/2021. Other: No free fluid.  Mesenteries and peritoneum are unremarkable. Musculoskeletal: Degenerative changes in the spine. No worrisome lytic or sclerotic lesions. IMPRESSION: 1. 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 2. Mixed cystic and solid mass in the region of the vaginal cuff, worrisome for malignancy. 3. New areas of patchy ground-glass and consolidation in the right upper lobe, lingula and left lower lobe, likely due to pneumonia. Treatment later organizing pneumonia not excluded. 4. Hepatic steatosis. 5. Bilateral renal stones. 6.  Aortic atherosclerosis (ICD10-I70.0). Electronically Signed   By: MLorin PicketM.D.   On: 04/13/2022 16:26     Assessment and plan-   #Dysphagia, EGD findings were reviewed with patient. Possibly due to acid reflux and throat irritation.  Continue PPI per GI recommendation.  Awaiting for EGD biopsy pathology I  will check a CT chest with contrast as well as neck soft tissue with contrast for further evaluation of any extrinsic compression.  Agrees with the plan.  #Metastatic colon cancer, CEA stable.  Currently on palliative chemotherapy. CT abdomen pelvis images were reviewed and discussed with patient.  Stable disease.  She will follow-up outpatient for palliative chemotherapy.  #Chemotherapy induced anemia, hemoglobin 8.8, monitor. #Chemotherapy-induced thrombocytopenia, monitor.  No bleeding events.  Thank you for allowing me to participate in the care of this patient.   [name], PhD Hematology Oncology 06/25/2022

## 2022-06-25 NOTE — Progress Notes (Signed)
  Progress Note   Patient: Chelsea [name] ZOX:[id number] DOB: [birth year] DOA: 06/24/2022     1 DOS: the patient was seen and examined on 06/25/2022   Brief hospital course: Chelsea [name] is a 76 y.o. 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 Tasia Catchings, oncologist, essential hypertension, chronic pain/neuropathy, degenerative disc disease, depression with anxiety now presents to emergency department with 2 to 3-day history of difficulty in swallowing and 1 day history of abdominal pain.  She also complaining of pain with swallowing. EGD was performed on 9/21, showed erosive gastritis, no evidence of esophageal abnormality.  Assessment and Plan: Dysphagia with odynophagia. Erosive gastritis. Discussed with Dr. Marius Ditch, EGD showed erosive gastritis, no significant esophageal abnormality. Condition could be secondary to acid reflux.  We will also order a neck soft tissue CT scan. Continue Protonix twice a day, also added sucralfate. Due to difficulty with swallowing, patient will be on full liquid diet.  I will continue IV fluids to prevent dehydration.  Recheck electrolytes tomorrow. Also obtain oncology consult.  Metastatic colon cancer. Anemia. Thrombocytopenia. Patient does not have iron deficiency, B12 level is pending. No active bleeding.  Hypokalemia Repeated.  Recheck level tomorrow.     Subjective:  She is still complaining of significant dysphagia with odynophagia. No nausea vomiting abdominal pain today.  Physical Exam: Vitals:   06/24/22 2133 06/25/22 0734 06/25/22 1151 06/25/22 1226  BP: 124/63 131/73 132/70 (!) 87/59  Pulse: 79 73 75 66  Resp: '18 16 16 20  '$ Temp: 98.2 F (36.8 C) 98.2 F (36.8 C) (!) 96.4 F (35.8 C) 97.9 F (36.6 C)  TempSrc: Oral  Temporal Temporal  SpO2: 99% 99% 97% 100%  Weight:   69.5 kg   Height:   '4\' 9"'$  (1.448 m)    General exam: Appears  calm and comfortable  Respiratory system: Clear to auscultation. Respiratory effort normal. Cardiovascular system: S1 & S2 heard, RRR. No JVD, murmurs, rubs, gallops or clicks. No pedal edema. Gastrointestinal system: Abdomen is nondistended, soft and nontender. No organomegaly or masses felt. Normal bowel sounds heard. Central nervous system: Alert and oriented. No focal neurological deficits. Extremities: Symmetric 5 x 5 power. Skin: No rashes, lesions or ulcers Psychiatry: Judgement and insight appear normal. Mood & affect appropriate.   Data Reviewed:  Reviewed CT results, EGD results, lab results.  Family Communication: daughter updated  Disposition: Status is: Inpatient Remains inpatient appropriate because: Severity of disease, IV treatment.  Planned Discharge Destination: Home    Time spent: 35 minutes  Author: Sharen Hones, MD 06/25/2022 12:48 PM  For on call review [email or web address].

## 2022-06-25 NOTE — Progress Notes (Signed)
Initial Nutrition Assessment  DOCUMENTATION CODES:   Obesity unspecified  INTERVENTION:   -Ensure Enlive po TID, each supplement provides 350 kcal and 20 grams of protein -MVI with minerals daily  NUTRITION DIAGNOSIS:   Increased nutrient needs related to cancer and cancer related treatments as evidenced by estimated needs.  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 Tasia Catchings, oncologist, essential hypertension, chronic pain/neuropathy, degenerative disc disease, depression with anxiety now presents with 2 to 3-day history of difficulty in swallowing and 1 day history of abdominal pain.  Pt admitted with dysphagia and dehydration.   9/21- s/p EGD- revealed erosive gastropathy with no bleeding or stigmata (biopsied)  Reviewed I/O's: +300 ml x 24 hours   Spoke with pt at bedside. Pt initially was upset that RD was involved in her care and expresses that she does not want to be in the hospital. RD allowed pt to express her feelings and provided reflective listening. Pt shares she has experienced a general decline in health over the past 10 months, related to undergoing cancer treatments. She shares that she has undergone a lot of loss and transition over the past few years, but finds strength in her faith and family.   Per pt, she has had no oral intake over the past 3 days secondary to difficulty and pain with swallowing. Pt shares she had not been eating well over the past few months secondary to side effects of chemotherapy. Pt reports that she cannot swallow and was upset that she was offered ice chips. RD explained rationale for NPO status and EGD; pt expressed appreciation for visit and is hopeful to  find cause of swallowing difficulty.   Medications reviewed and include 7.1% wt loss over th past 3 months. While this is not significant for time frame, it is concerning given pt's multiple co-morbidities.   Medications reviewed and include vitamin B-12, colace, miralax, vitamin E, and lactated ringers @ 75 ml/hr.   Labs reviewed: K: 3.3. Phos: 2.2.   NUTRITION - FOCUSED PHYSICAL EXAM:  Flowsheet Row Most Recent Value  Orbital Region No depletion  Upper Arm Region No depletion  Thoracic and Lumbar Region No depletion  Buccal Region No depletion  Temple Region No depletion  Clavicle Bone Region No depletion  Clavicle and Acromion Bone Region No depletion  Scapular Bone Region No depletion  Dorsal Hand No depletion  Patellar Region No depletion  Anterior Thigh Region No depletion  Posterior Calf Region No depletion  Edema (RD Assessment) None  Hair Reviewed  Eyes Reviewed  Mouth Reviewed  Skin Reviewed  Nails Reviewed       Diet Order:   Diet Order             Diet full liquid Room service appropriate? Yes; Fluid consistency: Thin  Diet effective now                   EDUCATION NEEDS:   Education needs have been addressed  Skin:  Skin Assessment: Reviewed RN Assessment  Last BM:  Unknown  Height:   Ht Readings from Last 1 Encounters:  06/25/22 '4\' 9"'$  (1.448 m)    Weight:   Wt Readings from Last 1 Encounters:  06/25/22 69.5 kg    Ideal Body Weight:  43.2 kg  BMI:  Body mass index is 33.16 kg/m.  Estimated Nutritional Needs:   Kcal:  1500-1700  Protein:  70-85 grams  Fluid:  > 1.5 L    Loistine Chance, RD, LDN, [place] Registered Dietitian II Certified Diabetes Care and Education Specialist Please refer to [facility] for RD and/or RD on-call/weekend/after hours pager

## 2022-06-25 NOTE — Transfer of Care (Signed)
Immediate Anesthesia Transfer of Care Note  Patient: Chelsea [name]  Procedure(s) Performed: ESOPHAGOGASTRODUODENOSCOPY (EGD) WITH PROPOFOL  Patient Location: Endoscopy Unit  Anesthesia Type:General  Level of Consciousness: drowsy and patient cooperative  Airway & Oxygen Therapy: Patient Spontanous Breathing and Patient connected to face mask oxygen  Post-op Assessment: Report given to RN and Post -op Vital signs reviewed and stable  Post vital signs: Reviewed and stable  Last Vitals:  Vitals Value Taken Time  BP 87/59 06/25/22 1226  Temp 36.6 C 06/25/22 1226  Pulse 66 06/25/22 1226  Resp 20 06/25/22 1226  SpO2 100 % 06/25/22 1226    Last Pain:  Vitals:   06/25/22 1226  TempSrc: Temporal  PainSc: Asleep      Patients Stated Pain Goal: 0 (29/57/47 3403)  Complications: No notable events documented.

## 2022-06-25 NOTE — Anesthesia Postprocedure Evaluation (Signed)
Anesthesia Post Note  Patient: SHANDEL BUSIC  Procedure(s) Performed: ESOPHAGOGASTRODUODENOSCOPY (EGD) WITH PROPOFOL  Patient location during evaluation: PACU Anesthesia Type: General Level of consciousness: awake Pain management: satisfactory to patient Vital Signs Assessment: post-procedure vital signs reviewed and stable Respiratory status: nonlabored ventilation and respiratory function stable Cardiovascular status: stable Anesthetic complications: no   No notable events documented.   Last Vitals:  Vitals:   06/25/22 1246 06/25/22 1256  BP: 137/71 135/66  Pulse: 69 71  Resp: 12 15  Temp:    SpO2: 98% 97%    Last Pain:  Vitals:   06/25/22 1256  TempSrc:   PainSc: 0-No pain                 VAN STAVEREN,Naithen [place]

## 2022-06-26 ENCOUNTER — Encounter: Payer: Self-pay | Admitting: Gastroenterology

## 2022-06-26 DIAGNOSIS — R1310 Dysphagia, unspecified: Secondary | ICD-10-CM | POA: Diagnosis not present

## 2022-06-26 DIAGNOSIS — E876 Hypokalemia: Secondary | ICD-10-CM

## 2022-06-26 DIAGNOSIS — K253 Acute gastric ulcer without hemorrhage or perforation: Secondary | ICD-10-CM | POA: Diagnosis not present

## 2022-06-26 DIAGNOSIS — I10 Essential (primary) hypertension: Secondary | ICD-10-CM | POA: Diagnosis not present

## 2022-06-26 LAB — PROCALCITONIN: Procalcitonin: 0.18 ng/mL

## 2022-06-26 LAB — BASIC METABOLIC PANEL
Anion gap: 7 (ref 5–15)
BUN: <5 mg/dL — ABNORMAL LOW (ref 8–23)
CO2: 28 mmol/L (ref 22–32)
Calcium: 8.6 mg/dL — ABNORMAL LOW (ref 8.9–10.3)
Chloride: 107 mmol/L (ref 98–111)
Creatinine, Ser: 0.52 mg/dL (ref 0.44–1.00)
GFR, Estimated: >60 mL/min (ref >60)
Glucose, Bld: 97 mg/dL (ref 70–99)
Potassium: 3.4 mmol/L — ABNORMAL LOW (ref 3.5–5.1)
Sodium: 142 mmol/L (ref 135–145)

## 2022-06-26 LAB — SURGICAL PATHOLOGY

## 2022-06-26 LAB — MAGNESIUM: Magnesium: 1.5 mg/dL — ABNORMAL LOW (ref 1.7–2.4)

## 2022-06-26 MED ORDER — K-PHOS-NEUTRAL 155-852-130 MG PO TABS: 1.0000 | ORAL_TABLET | Freq: Four times a day (QID) | ORAL | 0 refills | Status: AC

## 2022-06-26 MED ORDER — SUCRALFATE 1 GM/10ML PO SUSP: 1.0000 g | Freq: Three times a day (TID) | ORAL | 0 refills | Status: DC

## 2022-06-26 MED ORDER — ASPIRIN 81 MG PO TBEC: 81.0000 mg | DELAYED_RELEASE_TABLET | Freq: Every day | ORAL | 0 refills | Status: AC

## 2022-06-26 MED ORDER — PANTOPRAZOLE SODIUM 40 MG PO TBEC: 40.0000 mg | DELAYED_RELEASE_TABLET | Freq: Two times a day (BID) | ORAL | 0 refills | Status: DC

## 2022-06-26 MED ORDER — POTASSIUM CHLORIDE 10 MEQ/100ML IV SOLN
10.0000 meq | INTRAVENOUS | Status: AC
  Administered 2022-06-26 (×2): 10 meq via INTRAVENOUS
  Filled 2022-06-26 (×2): qty 100

## 2022-06-26 MED ORDER — MAGIC MOUTHWASH W/LIDOCAINE: 10.0000 mL | Freq: Four times a day (QID) | ORAL | 0 refills | Status: AC

## 2022-06-26 MED ORDER — MAGNESIUM SULFATE 4 GM/100ML IV SOLN
4.0000 g | Freq: Once | INTRAVENOUS | Status: AC
  Administered 2022-06-26: 4 g via INTRAVENOUS
  Filled 2022-06-26: qty 100

## 2022-06-26 NOTE — Plan of Care (Signed)
  Problem: Health Behavior/Discharge Planning: Goal: Ability to manage health-related needs will improve Outcome: Progressing   Problem: Clinical Measurements: Goal: Respiratory complications will improve Outcome: Progressing   Problem: Clinical Measurements: Goal: Respiratory complications will improve Outcome: Progressing   Problem: Clinical Measurements: Goal: Cardiovascular complication will be avoided Outcome: Progressing   Problem: Activity: Goal: Risk for activity intolerance will decrease Outcome: Progressing   Problem: Nutrition: Goal: Adequate nutrition will be maintained Outcome: Progressing   Problem: Coping: Goal: Level of anxiety will decrease Outcome: Progressing   Problem: Elimination: Goal: Will not experience complications related to bowel motility Outcome: Progressing

## 2022-06-26 NOTE — TOC Progression Note (Signed)
Transition of Care [facility]) - Progression Note    Patient Details  Name: Chelsea [name] MRN: [id number] Date of Birth: 15-Sep-1946  Transition of Care [facility]) CM/SW [place], RN Phone Number: 06/26/2022, 12:23 PM  Clinical Narrative:    Patient is currently seen by oncology for Met colon cancer,  EGD completed, pathology pending She is unable to eat well due to dysphagia TOC to follow and assist with DC planning as needed She will follow-up outpatient for palliative chemotherapy  Expected Discharge Plan: Home/Self Care Barriers to Discharge: Continued Medical Work up  Expected Discharge Plan and Services Expected Discharge Plan: Home/Self Care                                               Social Determinants of Health (SDOH) Interventions    Readmission Risk Interventions     No data to display

## 2022-06-26 NOTE — Care Management Important Message (Signed)
Important Message  Patient Details  Name: Chelsea [name] MRN: [id number] Date of Birth: 12/22/1945   Medicare Important Message Given:  N/A - LOS <3 / Initial given by admissions     Dannette Barbara 06/26/2022, 9:11 AM

## 2022-06-26 NOTE — Plan of Care (Signed)

## 2022-06-26 NOTE — Discharge Summary (Signed)
Physician Discharge Summary   Patient: Chelsea [name] MRN: [id number] DOB: [birth year]  Admit date:     06/24/2022  Discharge date: 06/26/22  Discharge Physician: Sharen Hones   PCP: Jearld Fenton, NP   Recommendations at discharge:   Follow-up with PCP in 1 week. Follow-up with oncology as scheduled. Mechanical soft diet.  Discharge Diagnoses: Principal Problem:   Dysphagia Active Problems:   Dehydration   Metastatic colon cancer to liver (HCC)   Neuropathy   Thrombocytopenia (HCC)   Chronic anemia   Essential hypertension   GAD (generalized anxiety disorder)   Hypokalemia   Odynophagia   Gastric erosion   Abdominal pain  Resolved Problems:   * No resolved hospital problems. *  Hospital Course: Chelsea [name] is a 76 y.o. 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 Tasia Catchings, oncologist, essential hypertension, chronic pain/neuropathy, degenerative disc disease, depression with anxiety now presents to emergency department with 2 to 3-day history of difficulty in swallowing and 1 day history of abdominal pain.  She also complaining of pain with swallowing. EGD was performed on 9/21, showed erosive gastritis, no evidence of esophageal abnormality.  Assessment and Plan: Dysphagia with odynophagia. Erosive gastritis. Discussed with Dr. Marius Ditch, EGD showed erosive gastritis, no significant esophageal abnormality. Condition could be secondary to acid reflux.   Neck soft tissue CT did not show any acute changes CT chest with contrast showed aspiration pneumonitis.   Continue Protonix twice a day, also added sucralfate. I reviewed the patient's chest CT images, there is aspiration pneumonitis, procalcitonin level 0.18, it appears the aspiration caused a chemical pneumonia rather than bacterial pneumonia.  We will hold off antibiotics for now. Patient will follow-up with PCP and  oncology in the near future.   Metastatic colon cancer. Anemia. Thrombocytopenia. Patient does not have iron deficiency, 12 level elevated. No active bleeding.   Hypokalemia Hypomagnesemia. Minimal hypophosphatemia. Potassium still 3.4, will give 20 mEq IV potassium, magnesium 1.5, give 4 g of magnesium sulfate. We will also prescribe 4 doses of Neutra-Phos with phos level of 2.2.        Consultants: GI Procedures performed: EGD  Disposition: Home Diet recommendation:  Discharge Diet Orders (From admission, onward)     Start     Ordered   06/26/22 0000  Diet general       Comments: Dys 3 diet (mechanical soft diet))   06/26/22 1436           Dysphagia type 3 thin Liquid DISCHARGE MEDICATION: Allergies as of 06/26/2022       Reactions   Penicillins    "Everything turned black"        Medication List     STOP taking these medications    aspirin 325 MG tablet Replaced by: aspirin EC 81 MG tablet   lidocaine 5 % Commonly known as: LIDODERM   prochlorperazine 10 MG tablet Commonly known as: COMPAZINE   vitamin E 180 MG (400 UNITS) capsule       TAKE these medications    acetaminophen 500 MG tablet Commonly known as: TYLENOL Take 500 mg by mouth every 6 (six) hours as needed.   ALPRAZolam 0.5 MG tablet Commonly known as: XANAX TAKE 1 TABLET BY MOUTH TWICE A DAY AS NEEDED FOR ANXIETY   aspirin EC 81 MG tablet Take 1 tablet (81 mg total) by mouth daily. Swallow whole. Replaces: aspirin 325 MG tablet  atorvastatin 10 MG tablet Commonly known as: LIPITOR TAKE 1 TABLET BY MOUTH EVERY DAY   busPIRone 5 MG tablet Commonly known as: BUSPAR Take 1 tablet (5 mg total) by mouth 3 (three) times daily.   diclofenac Sodium 1 % Gel Commonly known as: VOLTAREN Apply 4 g topically 4 (four) times daily.   docusate sodium 100 MG capsule Commonly known as: Colace Take 1 capsule (100 mg total) by mouth 2 (two) times daily.   FLUoxetine 20 MG  capsule Commonly known as: PROZAC TAKE 3 CAPSULES (60 MG TOTAL) BY MOUTH EVERY MORNING.   gabapentin 100 MG capsule Commonly known as: NEURONTIN Take 200 mg by mouth 2 (two) times daily. What changed: Another medication with the same name was removed. Continue taking this medication, and follow the directions you see here.   lidocaine-prilocaine cream Commonly known as: EMLA Apply small amount of cream to port site 1-2 hour prior to chemo treatment.   loperamide 2 MG capsule Commonly known as: IMODIUM TAKE 1 CAP BY MOUTH SEE ADMIN INSTRUCTIONS. INITIAL: 4 MG, FOLLOWED BY 2 MG AFTER EACH LOOSE STOOL MAXIMUM: 16 MG/DAY   loratadine 10 MG tablet Commonly known as: CLARITIN Take 1 tablet (10 mg total) by mouth See admin instructions. Take 1 tablet daily for 4 days after each chemotherapy treatments.   magic mouthwash w/lidocaine Soln Take 10 mLs by mouth 4 (four) times daily for 7 days.   meclizine 12.5 MG tablet Commonly known as: ANTIVERT Take 1 tablet (12.5 mg total) by mouth 3 (three) times daily as needed for dizziness.   methocarbamol 500 MG tablet Commonly known as: ROBAXIN TAKE 1 TABLET TWICE DAILY AS NEEDED FOR MUSCLE SPASM(S)   ondansetron 8 MG tablet Commonly known as: Zofran Take 1 tablet (8 mg total) by mouth 2 (two) times daily as needed (Nausea or vomiting).   pantoprazole 40 MG tablet Commonly known as: Protonix Take 1 tablet (40 mg total) by mouth 2 (two) times daily before a meal.   polyethylene glycol powder 17 GM/SCOOP powder Commonly known as: GLYCOLAX/MIRALAX Take 17 g by mouth daily.   potassium chloride SA 20 MEQ tablet Commonly known as: KLOR-CON M TAKE 1 TABLET EVERY DAY   sucralfate 1 GM/10ML suspension Commonly known as: CARAFATE Take 10 mLs (1 g total) by mouth 4 (four) times daily -  with meals and at bedtime.   triamterene-hydrochlorothiazide 37.5-25 MG tablet Commonly known as: MAXZIDE-25 TAKE 1 TABLET EVERY DAY   VITAMIN B 12  PO Take 500 mcg by mouth daily.        Follow-up Information     Jearld Fenton, NP Follow up in 1 week(s).   Specialties: Internal Medicine, Emergency Medicine Contact information: [place] [address] [phone]         [name], MD Follow up.   Specialty: Oncology Why: as scheduled Contact information: [place] [address] [phone]                Discharge Exam: Danley Danker Weights   06/24/22 1110 06/25/22 1151  Weight: 69.5 kg 69.5 kg   General exam: Appears calm and comfortable  Respiratory system: Clear to auscultation. Respiratory effort normal. Cardiovascular system: S1 & S2 heard, RRR. No JVD, murmurs, rubs, gallops or clicks. No pedal edema. Gastrointestinal system: Abdomen is nondistended, soft and nontender. No organomegaly or masses felt. Normal bowel sounds heard. Central nervous system: Alert and oriented. No focal neurological deficits. Extremities: Symmetric 5 x  5 power. Skin: No rashes, lesions or ulcers Psychiatry: Judgement and insight appear normal. Mood & affect appropriate.    Condition at discharge: good  The results of significant diagnostics from this hospitalization (including imaging, microbiology, ancillary and laboratory) are listed below for reference.   Imaging Studies: CT CHEST W CONTRAST  Result Date: 06/25/2022 CLINICAL DATA:  Aspiration. EXAM: CT CHEST WITH CONTRAST TECHNIQUE: Multidetector CT imaging of the chest was performed during intravenous contrast administration. RADIATION DOSE REDUCTION: This exam was performed according to the departmental dose-optimization program which includes automated exposure control, adjustment of the mA and/or kV according to patient size and/or use of iterative reconstruction technique. CONTRAST:  3m OMNIPAQUE IOHEXOL 300 MG/ML  SOLN COMPARISON:  CT of the chest abdomen pelvis dated 04/13/2022. FINDINGS: Cardiovascular: There is no cardiomegaly or  pericardial effusion. Mild atherosclerotic calcification of the thoracic aorta. No aneurysmal dilatation or dissection. The origins of the great vessels of the aortic arch and the central pulmonary arteries are patent. Left-sided Port-A-Cath with tip in the right atrium close to the cavoatrial junction. Mediastinum/Nodes: No hilar or mediastinal adenopathy. Calcified granuloma. The esophagus is grossly unremarkable. No mediastinal fluid collection. Lungs/Pleura: Patchy areas of airspace opacity in the right upper lobe, progressed since the prior radiograph may represent worsening or recurrent pneumonia. Clinical correlation and follow-up to resolution recommended. Partially calcified right middle lobe nodule, likely granuloma. Patchy areas of ground-glass density involving the left lower lobe and lingula similar or slightly progressed since the prior radiograph and may represent worsening infiltrate. Aspiration is not excluded. No pleural effusion or pneumothorax. The central airways are patent. Upper Abdomen: Probable gallbladder sludge or small stones. Fatty liver with a possible changes of cirrhosis. Small bilateral renal calculi. Areas of cortical irregularity and scarring of the kidneys. Musculoskeletal: Osteopenia with degenerative changes of the spine. 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 No acute osseous pathology. IMPRESSION: 1. Patchy areas of airspace opacity in the right upper lobe and left lower lobe and lingula slightly progressed since the prior radiograph and may represent worsening or recurrent pneumonia. Clinical correlation and follow-up to resolution recommended. 2. Aortic Atherosclerosis (ICD10-I70.0). Electronically Signed   By: AAnner CreteM.D.   On: 06/25/2022 18:06   CT SOFT TISSUE NECK W CONTRAST  Result Date: 06/25/2022 CLINICAL DATA:  Swallowing difficulties, palate weakness (CN 9) EXAM: CT NECK WITH CONTRAST  TECHNIQUE: Multidetector CT imaging of the neck was performed using the standard protocol following the bolus administration of intravenous contrast. RADIATION DOSE REDUCTION: This exam was performed according to the departmental dose-optimization program which includes automated exposure control, adjustment of the mA and/or kV according to patient size and/or use of iterative reconstruction technique. CONTRAST:  739mOMNIPAQUE IOHEXOL 300 MG/ML  SOLN COMPARISON:  None Available. FINDINGS: Pharynx and larynx: Normal. No mass or swelling. Salivary glands: No inflammation, mass, or stone. Thyroid: Normal. Lymph nodes: None enlarged or abnormal density. Vascular: Patent. Limited intracranial: Negative. Visualized orbits: Negative. Mastoids and visualized paranasal sinuses: Clear. Skeleton: No acute osseous abnormality. Posterior fusion and decompression C3-C6. Upper chest: Please see same-day CT chest. Other: None. IMPRESSION: 1. No acute process in the neck to explain the patient's difficulty swallowing. 2.  For findings in the thorax, please see same day CT chest. Electronically Signed   By: AlMerilyn Baba.D.   On: 06/25/2022 18:02   CT ABDOMEN PELVIS W CONTRAST  Result Date: 06/24/2022 CLINICAL DATA:  Pain right lower quadrant, pelvic pain, vomiting,  metastatic colon carcinoma EXAM: CT ABDOMEN AND PELVIS WITH CONTRAST TECHNIQUE: Multidetector CT imaging of the abdomen and pelvis was performed using the standard protocol following bolus administration of intravenous contrast. RADIATION DOSE REDUCTION: This exam was performed according to the departmental dose-optimization program which includes automated exposure control, adjustment of the mA and/or kV according to patient size and/or use of iterative reconstruction technique. CONTRAST:  183m OMNIPAQUE IOHEXOL 300 MG/ML  SOLN COMPARISON:  Previous studies including the examination of 04/13/2022 FINDINGS: Lower chest: Small patchy infiltrates are seen in lower  lung fields, more so in the left lower lobe. There is calcified granuloma in left lower lung field. Hepatobiliary: There are a few low-density lesions in liver largest measuring 2 cm with no significant interval change. There is fatty infiltration in liver. Gallbladder is distended. There is no wall thickening. There is no dilation of bile ducts. Pancreas: No focal abnormalities are seen. Spleen: Unremarkable. Adrenals/Urinary Tract: Adrenals are unremarkable. There is no hydronephrosis. 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 There is 2 mm calculus in the right kidney. There is 2 mm calculus in the left kidney. Urinary bladder is distended. Diverticulum is noted in the left posterolateral wall of the bladder. Stomach/Bowel: Stomach is not distended. Small bowel loops are not dilated. Appendix is not seen. There is no pericecal inflammation. Wall thickening in cecum appears less prominent. In the current study, maximum wall thickness in the medial aspect of the cecum measures 12 mm. Few scattered diverticula are seen in colon without signs of focal acute diverticulitis. Vascular/Lymphatic: Scattered arterial calcifications are seen. Reproductive: Uterus is not seen. There is 5.5 x 4.2 cm inhomogeneous soft tissue mass and fluid density in vaginal cuff with no significant interval change. Small amount of fluid is seen in the vaginal canal. Other: There is no ascites or pneumoperitoneum. Small umbilical hernia containing fat is seen. Musculoskeletal: There is minimal anterolisthesis at L4-L5 level. There is severe spinal stenosis and encroachment of neural foramina at L4-L5 level. There is encroachment of neural foramina at L5-S1 level, more so on the right side. There is partial fusion of bodies of L5 and S1 vertebrae. IMPRESSION: There are low-density lesions in liver consistent with metastatic disease with no significant change. Wall thickening in the cecum appears less  prominent. There is 5.5 x 4.2 cm mixed density lesion in vaginal cuff suggesting possible metastatic disease. There is no evidence of intestinal obstruction or pneumoperitoneum. There is no hydronephrosis. Small patchy infiltrates are seen in the lower lung fields suggesting atelectasis/pneumonia. Small bilateral renal stones. Diverticulosis of colon without signs of diverticulitis. 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 There is encroachment of neural foramina at L4-L5 and L5-S1 levels. Other findings as described in the body of the report. Electronically Signed   By: PElmer PickerM.D.   On: 06/24/2022 14:27   DG Chest 2 View  Result Date: 06/24/2022 CLINICAL DATA:  Dysphagia. EXAM: CHEST - 2 VIEW COMPARISON:  Chest x-ray 06/17/2021. FINDINGS: Similar mild enlargement the cardiac silhouette. Left IJ approach Port-A-Cath with the tip projecting at the superior right atrium. No consolidation. No visible pleural effusions or pneumothorax. Partially imaged ACDF. Polyarticular degenerative change. 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 IMPRESSION: No evidence of acute cardiopulmonary disease. Electronically Signed   By: FMargaretha SheffieldM.D.   On: 06/24/2022 12:18    Microbiology: Results for orders placed or performed in visit on 10/22/21  Urine culture     Status:  None   Collection Time: 10/22/21 11:20 AM   Specimen: Urine, Random  Result Value Ref Range Status   Specimen Description   Final    URINE, RANDOM Performed at [facility], [address] [place], [place] [address]    Special Requests   Final    NONE Performed at [facility], [address], [place], [place] [address]    Culture   Final    NO GROWTH Performed at [facility], [place] [address], [place], [place] [address]    Report Status 10/23/2021 FINAL  Final    Labs: CBC: Recent Labs  Lab 06/24/22 1144 06/25/22 0335  WBC 10.7* 9.2  NEUTROABS 8.5*  --   HGB  9.9* 8.8*  HCT 32.1* 28.1*  MCV 100.6* 100.4*  PLT 93* 78*   Basic Metabolic Panel: Recent Labs  Lab 06/24/22 1144 06/24/22 1424 06/25/22 0335 06/26/22 0638  NA 140  --  143 142  K 3.7  --  3.3* 3.4*  CL 102  --  110 107  CO2 29  --  26 28  GLUCOSE 94  --  80 97  BUN 12  --  9 <5*  CREATININE 0.71  --  0.61 0.52  CALCIUM 9.0  --  8.5* 8.6*  MG  --  1.8  --  1.5*  PHOS  --  2.2*  --   --    Liver Function Tests: Recent Labs  Lab 06/24/22 1144  AST 24  ALT 12  ALKPHOS 122  BILITOT 0.9  PROT 7.0  ALBUMIN 3.3*   CBG: No results for input(s): "GLUCAP" in the last 168 hours.  Discharge time spent: greater than 30 minutes.  Signed: Sharen Hones, MD [facility] 06/26/2022

## 2022-06-26 NOTE — Evaluation (Signed)
Clinical/Bedside Swallow Evaluation Patient Details  Name: Chelsea [name] MRN: [id number] Date of Birth: Nov 06, 1945  Today's Date: 06/26/2022 Time: SLP Start Time (ACUTE ONLY): 1140 SLP Stop Time (ACUTE ONLY): 7 SLP Time Calculation (min) (ACUTE ONLY): 60 min  Past Medical History:  Past Medical History:  Diagnosis Date   Anxiety    Arthritis    Depression    Hyperlipidemia    Hypertension    Liver mass    Metastatic colon cancer to liver ([place])    Moderate mitral insufficiency    Port-A-Cath in place    Seizures [facility])    Spinal stenosis    Ulcer    Urinary incontinence    Past Surgical History:  Past Surgical History:  Procedure Laterality Date   ABDOMINAL HYSTERECTOMY     BACK SURGERY     due to polio   BREAST BIOPSY Bilateral    neg   BREAST BIOPSY Right 2011   neg/stereo   CARPAL TUNNEL RELEASE     CATARACT EXTRACTION Right 2020   COLONOSCOPY WITH PROPOFOL N/A 04/04/2021   Procedure: COLONOSCOPY WITH PROPOFOL;  Surgeon: Virgel Manifold, MD;  Location: [facility] ENDOSCOPY;  Service: Endoscopy;  Laterality: N/A;   ESOPHAGOGASTRODUODENOSCOPY (EGD) WITH PROPOFOL N/A 06/25/2022   Procedure: ESOPHAGOGASTRODUODENOSCOPY (EGD) WITH PROPOFOL;  Surgeon: Lin Landsman, MD;  Location: [facility] ENDOSCOPY;  Service: Gastroenterology;  Laterality: N/A;   EYE SURGERY     IR IMAGING GUIDED PORT INSERTION  10/09/2021   HPI:  Pt is a 76 y.o. 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 Tasia Catchings, oncologist, essential hypertension, chronic pain/neuropathy, degenerative disc disease, depression with anxiety now presents to emergency department with 2 to 3-day history of difficulty in swallowing and 1 day history of abdominal pain.  Patient states that for the past 2 to 3 days she is unable to swallow except her on oral secretion.  Slowly she is starting to hurt whenever she tried to swallow.  She  tried some pudding and was not successful.  She also has some upper abdominal pain without any radiation.  Her abdominal pain is mild and intermittent in nature.  She called patient's oncologist office who advised her to go to ER.  She does have some nausea but denies any vomiting, fever, chills, headache, chest pain, constipation/diarrhea or urinary complaints.  Per GI f/u and EDG, pt has dx'd "Erosive gastropathy"; per Oncology note: CT abdomen pelvis Imaging with contrast showed low-density lesions in the liver consistent with metastatic disease with no significant changes.  5.5 x 4.2 cm mixed density lesion in vaginal cuff.  No evidence of obstruction, hydronephrosis.  Patchy infiltrates seen both lower lungs suggesting atelectasis/pneumonia.  Small bilateral renal stones.  Diverticulosis    Assessment / Plan / Recommendation  Clinical Impression   Pt seen for BSE this morning. Pt A/O x3; engaged easily and quite talkative. Afebrile, WBC not elevated.    OF NOTE: Pt per GI/EGD, pt has Erosive Gastropathy. This could easily impact oropharyngeal phase swallowing and desire to eat/drink.    Pt appears to present w/ adequate oropharyngeal phase swallowing function w/ No overt oropharyngeal phase dysphagia appreciated during oral intake of trials, Lunch meal; No neuromuscular swallowing deficits appreciated. Pt appears at reduced risk for aspiration from an oropharyngeal phase standpoint following general aspiration precautions.  HOWEVER, pt has a baseline presentation of Reflux concern and Esophageal phase Dysmotility d/t Erosive Gastropathy. She is  now on a PPI. ANY Dysmotility or Regurgitation of Reflux material can increase risk for aspiration of the Reflux material during Retrograde flow thus impact desire to eat/drink and Pulmonary status.     Pt sat upright in bed and consumed several trials of thin liquids Via Cup/Straw, purees, and solid foods w/ No immediate, overt clinical s/s of aspiration  noted; clear vocal quality b/t trials, no decline in pulmonary status, no multiple swallows noted post initial pharyngeal swallow, no decline in O2 sats(97%). Oral phase appeared [facility] for bolus management and timely A-P transfer/clearing of material. Mastication appropriate for boluses; though min extra time taken to fully clear mouth -- pt was easily distracted at times. Encouraged moistening foods more also.  OM exam was [facility] for oral clearing; lingual/labial movements. No unilateral weakness. Speech clear.    Recommend continue a Mech Soft diet (moistened foods) w/ thin liquids. General aspiration precautions. Rest Breaks during meals/oral intake to allow for Esophageal clearing. REFLUX precautions strongly recommended to lessen chance for Regurgitation -- HOB elevated at night when sleeping.  Noted initiation of a PPI for pt. Pills Whole w/ liquids or in a Puree if needed for easier swallowing, per NSG.   Recommend pt f/u w/ GI for assessment/management of Reflux and tx as indicated. Discussion and handouts given on REFLUX, impact of REFLUX on swallowing. MD to reconsult ST services if any new needs while admitted. NSG updated.  SLP Visit Diagnosis: Dysphagia, unspecified (R13.10)    Aspiration Risk   (reduced following precautions)    Diet Recommendation   Mech Soft diet (moistened foods) w/ thin liquids. General aspiration precautions. Rest Breaks during meals/oral intake to allow for Esophageal clearing. REFLUX precautions strongly recommended to lessen chance for Regurgitation.  Medication Administration: Whole meds with puree (IF unable to take 1-2 at a time w/ liquid)    Other  Recommendations Recommended Consults: Consider GI evaluation;Consider esophageal assessment (ongoing f/u w/ GI for management) Oral Care Recommendations: Oral care BID;Oral care before and after PO;Staff/trained caregiver to provide oral care (support pt) Other Recommendations:  (n/a)    Recommendations for follow  up therapy are one component of a multi-disciplinary discharge planning process, led by the attending physician.  Recommendations may be updated based on patient status, additional functional criteria and insurance authorization.  Follow up Recommendations No SLP follow up      Assistance Recommended at Discharge None  Functional Status Assessment Patient has had a recent decline in their functional status and demonstrates the ability to make significant improvements in function in a reasonable and predictable amount of time.  Frequency and Duration  (n/a)   (n/a)       Prognosis Prognosis for Safe Diet Advancement: Good Barriers to Reach Goals: Time post onset;Severity of deficits Barriers/Prognosis Comment: baseline Esophageal Dysmotility; f/u w/ GI      Swallow Study   General Date of Onset: 06/24/22 HPI: Pt is a 76 y.o. 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 Tasia Catchings, oncologist, essential hypertension, chronic pain/neuropathy, degenerative disc disease, depression with anxiety now presents to emergency department with 2 to 3-day history of difficulty in swallowing and 1 day history of abdominal pain.  Patient states that for the past 2 to 3 days she is unable to swallow except her on oral secretion.  Slowly she is starting to hurt whenever she tried to swallow.  She tried some pudding and was not successful.  She also has some upper abdominal pain without any radiation.  Her abdominal pain is mild and intermittent in nature.  She called patient's oncologist office who advised her to go to ER.  She does have some nausea but denies any vomiting, fever, chills, headache, chest pain, constipation/diarrhea or urinary complaints.  Per GI f/u and EDG, pt has dx'd "Erosive gastropathy"; per Oncology note: CT abdomen pelvis Imaging with contrast showed low-density lesions in the liver consistent with  metastatic disease with no significant changes.  5.5 x 4.2 cm mixed density lesion in vaginal cuff.  No evidence of obstruction, hydronephrosis.  Patchy infiltrates seen both lower lungs suggesting atelectasis/pneumonia.  Small bilateral renal stones.  Diverticulosis Type of Study: Bedside Swallow Evaluation Previous Swallow Assessment: none Diet Prior to this Study: Thin liquids;Dysphagia 3 (soft) Temperature Spikes Noted: No (wbc 9.2) Respiratory Status: Room air History of Recent Intubation: No Behavior/Cognition: Alert;Cooperative;Pleasant mood;Distractible;Requires cueing (min) Oral Cavity Assessment: Within Functional Limits Oral Care Completed by SLP: Yes Oral Cavity - Dentition: Adequate natural dentition;Missing dentition Vision: Functional for self-feeding Self-Feeding Abilities: Able to feed self;Needs set up Patient Positioning: Upright in bed (needed min posiitoning support) Baseline Vocal Quality: Normal Volitional Cough: Strong Volitional Swallow: Able to elicit    Oral/Motor/Sensory Function Overall Oral Motor/Sensory Function: Within functional limits   Ice Chips Ice chips: Not tested   Thin Liquid Thin Liquid: Within functional limits Presentation: Cup;Self Fed;Straw (10+ trials)    Nectar Thick Nectar Thick Liquid: Not tested   Honey Thick Honey Thick Liquid: Not tested   Puree Puree: Within functional limits Presentation: Self Fed;Spoon (5 trials)   Solid     Solid: Within functional limits (chopped, moistened) Presentation: Self Fed;Spoon (10+ trials)         Orinda Kenner, MS, CCC-SLP Speech Language Pathologist Rehab Services; [place] [phone] (ascom) Ivianna Notch 06/26/2022,2:50 PM

## 2022-07-01 ENCOUNTER — Inpatient Hospital Stay: Payer: Medicare HMO

## 2022-07-01 ENCOUNTER — Encounter: Payer: Self-pay | Admitting: Oncology

## 2022-07-01 ENCOUNTER — Inpatient Hospital Stay (HOSPITAL_BASED_OUTPATIENT_CLINIC_OR_DEPARTMENT_OTHER): Payer: Medicare HMO | Admitting: Oncology

## 2022-07-01 VITALS — BP 109/70 | HR 79 | Resp 18 | Ht <= 58 in | Wt 146.0 lb

## 2022-07-01 DIAGNOSIS — C787 Secondary malignant neoplasm of liver and intrahepatic bile duct: Secondary | ICD-10-CM | POA: Diagnosis not present

## 2022-07-01 DIAGNOSIS — C189 Malignant neoplasm of colon, unspecified: Secondary | ICD-10-CM

## 2022-07-01 DIAGNOSIS — E876 Hypokalemia: Secondary | ICD-10-CM

## 2022-07-01 DIAGNOSIS — D701 Agranulocytosis secondary to cancer chemotherapy: Secondary | ICD-10-CM | POA: Diagnosis not present

## 2022-07-01 DIAGNOSIS — Z79899 Other long term (current) drug therapy: Secondary | ICD-10-CM | POA: Diagnosis not present

## 2022-07-01 DIAGNOSIS — Z5112 Encounter for antineoplastic immunotherapy: Secondary | ICD-10-CM | POA: Diagnosis not present

## 2022-07-01 DIAGNOSIS — Z1509 Genetic susceptibility to other malignant neoplasm: Secondary | ICD-10-CM | POA: Diagnosis not present

## 2022-07-01 DIAGNOSIS — D6481 Anemia due to antineoplastic chemotherapy: Secondary | ICD-10-CM | POA: Diagnosis not present

## 2022-07-01 DIAGNOSIS — K2960 Other gastritis without bleeding: Secondary | ICD-10-CM | POA: Diagnosis not present

## 2022-07-01 DIAGNOSIS — Z111 Encounter for screening for respiratory tuberculosis: Secondary | ICD-10-CM | POA: Diagnosis not present

## 2022-07-01 LAB — CBC WITH DIFFERENTIAL/PLATELET
Abs Immature Granulocytes: 0.04 10*3/uL (ref 0.00–0.07)
Basophils Absolute: 0 10*3/uL (ref 0.0–0.1)
Basophils Relative: 0 %
Eosinophils Absolute: 0.2 10*3/uL (ref 0.0–0.5)
Eosinophils Relative: 5 %
HCT: 32.5 % — ABNORMAL LOW (ref 36.0–46.0)
Hemoglobin: 10.4 g/dL — ABNORMAL LOW (ref 12.0–15.0)
Immature Granulocytes: 1 %
Lymphocytes Relative: 41 %
Lymphs Abs: 1.8 10*3/uL (ref 0.7–4.0)
MCH: 31.6 pg (ref 26.0–34.0)
MCHC: 32 g/dL (ref 30.0–36.0)
MCV: 98.8 fL (ref 80.0–100.0)
Monocytes Absolute: 0.7 10*3/uL (ref 0.1–1.0)
Monocytes Relative: 16 %
Neutro Abs: 1.6 10*3/uL — ABNORMAL LOW (ref 1.7–7.7)
Neutrophils Relative %: 37 %
Platelets: 114 10*3/uL — ABNORMAL LOW (ref 150–400)
RBC: 3.29 MIL/uL — ABNORMAL LOW (ref 3.87–5.11)
RDW: 17.4 % — ABNORMAL HIGH (ref 11.5–15.5)
WBC: 4.4 10*3/uL (ref 4.0–10.5)
nRBC: 0 % (ref 0.0–0.2)

## 2022-07-01 LAB — COMPREHENSIVE METABOLIC PANEL
ALT: 18 U/L (ref 0–44)
AST: 32 U/L (ref 15–41)
Albumin: 3.2 g/dL — ABNORMAL LOW (ref 3.5–5.0)
Alkaline Phosphatase: 84 U/L (ref 38–126)
Anion gap: 8 (ref 5–15)
BUN: 10 mg/dL (ref 8–23)
CO2: 28 mmol/L (ref 22–32)
Calcium: 8.9 mg/dL (ref 8.9–10.3)
Chloride: 102 mmol/L (ref 98–111)
Creatinine, Ser: 0.95 mg/dL (ref 0.44–1.00)
GFR, Estimated: >60 mL/min (ref >60)
Glucose, Bld: 102 mg/dL — ABNORMAL HIGH (ref 70–99)
Potassium: 2.7 mmol/L — CL (ref 3.5–5.1)
Sodium: 138 mmol/L (ref 135–145)
Total Bilirubin: 0.5 mg/dL (ref 0.3–1.2)
Total Protein: 7.1 g/dL (ref 6.5–8.1)

## 2022-07-01 LAB — FERRITIN: Ferritin: 292 ng/mL (ref 11–307)

## 2022-07-01 LAB — VITAMIN B12: Vitamin B-12: 4939 pg/mL — ABNORMAL HIGH (ref 180–914)

## 2022-07-01 LAB — FOLATE: Folate: 38 ng/mL (ref >5.9)

## 2022-07-01 LAB — IRON AND TIBC
Iron: 57 ug/dL (ref 28–170)
Saturation Ratios: 20 % (ref 10.4–31.8)
TIBC: 283 ug/dL (ref 250–450)
UIBC: 226 ug/dL

## 2022-07-01 MED ORDER — HEPARIN SOD (PORK) LOCK FLUSH 100 UNIT/ML IV SOLN
500.0000 [IU] | Freq: Once | INTRAVENOUS | Status: AC
  Administered 2022-07-01: 500 [IU] via INTRAVENOUS
  Filled 2022-07-01: qty 5

## 2022-07-01 MED ORDER — SODIUM CHLORIDE 0.9% FLUSH
10.0000 mL | Freq: Once | INTRAVENOUS | Status: AC
  Administered 2022-07-01: 10 mL via INTRAVENOUS
  Filled 2022-07-01: qty 10

## 2022-07-01 MED ORDER — SODIUM CHLORIDE 0.9 % IV SOLN
INTRAVENOUS | Status: DC | PRN
  Filled 2022-07-01: qty 250

## 2022-07-01 MED ORDER — POTASSIUM CHLORIDE CRYS ER 20 MEQ PO TBCR: 40.0000 meq | EXTENDED_RELEASE_TABLET | Freq: Every day | ORAL | 0 refills | Status: DC

## 2022-07-01 MED ORDER — POTASSIUM CHLORIDE 20 MEQ/100ML IV SOLN
20.0000 meq | Freq: Once | INTRAVENOUS | Status: AC
  Administered 2022-07-01: 20 meq via INTRAVENOUS

## 2022-07-01 NOTE — Assessment & Plan Note (Signed)
Potassium is decreased.  Recommend IV potassium 20 meq x1 today.  Recommend patient to increase potassium supplementation to 85mq daily.

## 2022-07-01 NOTE — Progress Notes (Signed)
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  Patient receiving folfox/bevacizumab.    Met with patient during infusion today.  Patient slouched over with much of head covered during conversation with blankets due to being cold.  Noted recently hospitalization due to dysphagia and abdominal pain.  Noted SLP recommended mechanical soft with thin liquids.  Says that she is eating after discharge (soups, pudding, yogurt, ice cream).  Says that dysphagia has improved.  Not drinking shakes for the last week because made her nauseated.     Medications: K Cl, carafate, protonix, zofran  Labs: K 2.7, glucose 102  Anthropometrics:   Weight 146 lb today  157 lb on 8/23 161 lb on 8/9 171 lb on 6/7 178 lb on 4/19 174 lb on 3/22  15% weight loss in the last 3 months, significant  NUTRITION DIAGNOSIS: Inadequate oral intake continues   INTERVENTION:  Reviewed soft moist foods for ease of swallowing. Handout provided Discussed foods high in potassium Encouraged trying oral nutrition supplements again Consider adding appetite stimulant    MONITORING, EVALUATION, GOAL: weight trends, intake   NEXT VISIT: Wednesday, Oct 25 during infusion  Bettyjean Stefanski B. Zenia Resides, [place], [place] Registered Dietitian [phone]

## 2022-07-01 NOTE — Assessment & Plan Note (Signed)
Continue PPI.  Symptom has improved.

## 2022-07-01 NOTE — Assessment & Plan Note (Signed)
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  No sufficient tissue for NGS Liquid biopsy showed KRAS12V, APC, TP53 mutation, TMB 4.  S/p 6 cycles of 5-FU /Bevacizumab--> CT showed mixed response--> FOLFOX/bevacizumab.-->CT imaging shows stable disease.  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  Stable disease.

## 2022-07-01 NOTE — Progress Notes (Signed)
Hematology/Oncology Progress note [phone] [phone]         Patient Care Team: Jearld Fenton, NP as PCP - General (Internal Medicine) Clent Jacks, RN as Oncology Nurse Navigator [name], MD as Consulting Physician (Oncology)  ASSESSMENT & PLAN:   Cancer Staging  Metastatic colon cancer to liver [facility]) Staging form: Colon and Rectum, AJCC 8th Edition - Clinical stage from 09/04/2021: Stage Unknown (cTX, cNX, cM1) - Signed by [name], MD on 05/13/2022   Metastatic colon cancer to liver [facility]) #Metastatic colon cancer to liver, 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  No sufficient tissue for NGS Liquid biopsy showed KRAS12V, APC, TP53 mutation, TMB 4.  S/p 6 cycles of 5-FU /Bevacizumab--> CT showed mixed response--> FOLFOX/bevacizumab.-->CT imaging shows stable disease.  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  Stable disease.  Erosive gastritis Continue PPI.  Symptom has improved.  Hypokalemia Potassium is decreased.  Recommend IV potassium 20 meq x1 today.  Recommend patient to increase potassium supplementation to 76mq daily.    Orders Placed This Encounter  Procedures   Protein, urine, random    Standing Status:   Future    Standing Expiration Date:   08/14/2022   CEA    Standing Status:   Future    Standing Expiration Date:   07/16/2023   CBC with Differential    Standing Status:   Future    Standing Expiration Date:   07/16/2023   Comprehensive metabolic panel    Standing Status:   Future    Standing Expiration Date:   07/16/2023    I spent time and answered all patient's questions regarding why resuming chemotherapy is recommended.  All questions were answered to her satisfaction.   Follow-up in 2 weeks lab [name]. All questions were answered. The patient knows to call  the clinic with any problems, questions or concerns.  [name], PhD [facility] Oncology 07/01/2022   CHIEF COMPLAINTS/REASON FOR VISIT:  metastatic colon cancer  HISTORY OF PRESENTING ILLNESS:   Chelsea LENOIRis a  76y.o.  female presents for management of metastatic colon cancer. Oncology History  Metastatic colon cancer to liver ([place]  08/21/2021 Imaging   CT abdomen pelvis with contrast  showed soft tissue mass at the base of cecum measuring up to 5 cm, slightly increased in size.  Interval development of multiple low-density lesions throughout the liver highly suggestive for metastatic disease.  Complex mass in the region of the vaginal cuff measuring up to 5.2 cm, increased in size.  Right ovary also appears more prominent on the current examination.  Moderate large volume of stool throughout the colon.  Mild urinary bladder wall thickening.  Colonic diverticulosis without evidence of active diverticulitis.  Aortic atherosclerosis   09/04/2021 Procedure   patient is status post left lobe liver lesion biopsy and pathology showed metastatic adenocarcinoma, compatible with colorectal primary.   09/04/2021 Cancer Staging   Staging form: Colon and Rectum, AJCC 8th Edition - Clinical stage from 09/04/2021: Stage Unknown (cTX, cNX, cM1) - Signed by YEarlie Server MD on 05/13/2022 Stage prefix: Initial diagnosis   09/16/2021 Initial Diagnosis   Metastatic colon cancer to liver ([facility] one liquid biopsy testing showed KRASG12V, APC TP53 TMB 4  Patient initially went to emergency room on 03/24/2021 for abdominal pain. 03/24/2021, CT scan of the abdomen showed  marked bladder wall thickening with surrounding soft tissue stranding is noted.  Concerning for cystitis.  There is a lobulated mass between the posterior wall of the bladder and the rectum which appears to arise from the vaginal cuff.  This is indeterminate and difficult to characterize reflecting lack of IV contrast  material.  Nonobstructing left renal calculus, left sacroiliitis, lumbar spondylosis aortic atherosclerosis. 03/24/2021 CT pelvic showed abdominal [place] soft tissue of right cecum, concerning for colon carcinoma.  Colonoscopy recommended.  Low-attenuation mass in the region of vaginal cuff is again noted.  Diffuse urinary bladder wall thickening and mild bilateral hydroureter possibly cystitis.   [place] 03/24/2021 Lobulated solid 3.8 cm mass confirmed by ultrasound. This is most compatible with a neoplasm but remains indeterminate as to the origin as the epicenter seems to be outside of both the vaginal cuff and the adjacent colon, while the lesion appears inseparable from both. Extensive echogenic debris within the distended urinary bladder.   03/26/2021, patient was seen by Dr. Theora Gianotti for evaluation of colonic/vaginal cuff pelvic mass.  Patient also was referred to gastroenterology for colonoscopy.   03/26/2021 CEA 3.4.  CA125 9.7 04/04/2021, status post colonoscopy which showed a frond like /villous nonobstructing large mass was found in the cecum, this was biopsied.  Terminal ileum was briefly intubated and appeared normal.  Diverticulosis in the sigmoid colon.  The rectum, sigmoid colon, descending colon, transverse colon and ascending colon were normal.  Nonbleeding internal hemorrhoids. 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 Given that the biopsy may not be representative of the entire underlying lesion.  Patient was recommended to establish with surgeon for evaluation. Patient no showed for her surgery appointment and as well as her follow-up appointment with gastroenterology.  Per daughter, patient was having cervical spine surgery and she prioritized that over the colon surgery.   10/20/2021 - 01/07/2022 Chemotherapy    COLORECTAL 5-FU  + Bevacizumab q14d      10/20/2021 -  Chemotherapy   Patient is on Treatment Plan : COLORECTAL 5FU + Leucovorin (Modified [place]) +  Bevacizumab q14d     12/22/2021 Imaging   CT chest abdomen pelvis Decreased cecum mass, however increased size and size of metastatic lesions throughout the liver.  Interval enlargement of vaginal cuff mass. No mets in chest.    01/21/2022 -  Chemotherapy   COLORECTAL FOLFOX  + Bevacizumab q14d    patient was seen by gynecology oncology Dr. Fransisca Connors.  Biopsy of the vaginal mass was not recommended.Patient's case was also discussed on multidisciplinary tumor board.  IR potentially can try a biopsy if patient is willing to.I had a discussion with patient's daughter over the phone prior to this visit.  We discussed about option of adding oxaliplatin to 5-FU/bevacizumab and continue treatment of colon cancer, repeat CT scan short-term and if progression, repeat biopsy versus proceeding with vaginal mass biopsy.  Daughter prefers proceeding with chemotherapy and defer biopsy.   01/08/2022 patient has had a second opinion at [facility] and was seen by Dr. Reynaldo Minium .  Dr. Reynaldo Minium agrees with the current treatment plan with FOLFOX/bevacizumab.  He has ordered guardant 360 to see if she may be eligible for clinical trial in the future.  -   04/13/2022 Imaging    CT chest abdomen pelvis showed a cecal mass with stable hepatic metastasis.  Mixed cystic and solid mass in the region of the vaginal cuff, new area of patchy groundglass and consolidation in the right upper lobe, lingula and left lower  lobe.  Likely due to pneumonia.  Hepatic steatosis.  Bilateral renal stones.  Aortic atherosclerosis   06/24/2022 Imaging   CT abdomen pelvis with contrast showed There are low-density lesions in liver consistent with metastatic disease with no significant change. Wall thickening in the cecum appears less prominent. There is 5.5 x 4.2 cm mixed density lesion in vaginal cuff suggesting possible metastatic disease.   There is no evidence of intestinal obstruction or pneumoperitoneum.There is no hydronephrosis.   Small  patchy infiltrates are seen in the lower lung fields suggesting atelectasis/pneumonia.   Small bilateral renal stones. Diverticulosis of colon without signs of diverticulitis. 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 There is encroachment of neural foramina at L4-L5 and L5-S1 levels.     06/25/2022 Imaging   CT soft tissue neck with contrast showed1No acute process in the neck to explain the patient's difficulty swallowing.  CT chest contrast showed 1. Patchy areas of airspace opacity in the right upper lobe and left lower lobe and lingula slightly progressed since the prior radiograph and may represent worsening or recurrent pneumonia. Clinical correlation and follow-up to resolution recommended. 2. Aortic Atherosclerosis       INTERVAL HISTORY SYDNI ELIZARRARAZ is a 76 y.o. female who has above history reviewed by me today presents for follow up visit for management of metastatic colon cancer Patient was accompanied by daughter. 06/24/2022 - 06/26/2022 patient was hospitalized due to difficulty swallowing, 1 day of abdominal pain.  Patient had extensive work-up including EGD which showed erosive gastritis.  No significant esophageal or gastric abnormality.  CT scan showed possible aspiration induced pneumonitis.  Antibiotics was held.  Today patient denies any fever, chills,.  Occasionally she has cough. Difficulty of swallowing has completely resolved.  She has lost weight since her last visit.  Review of Systems  Constitutional:  Positive for fatigue. Negative for appetite change, chills, fever and unexpected weight change.  HENT:   Negative for hearing loss and voice change.   Eyes:  Negative for eye problems.  Respiratory:  Negative for chest tightness and cough.   Cardiovascular:  Negative for chest pain.  Gastrointestinal:  Negative for abdominal distention, abdominal pain, blood in stool and diarrhea.  Endocrine: Negative for hot flashes.  Genitourinary:   Negative for difficulty urinating and frequency.   Musculoskeletal:  Positive for back pain. Negative for arthralgias.  Skin:  Negative for itching and rash.  Neurological:  Positive for numbness (chornic finger tips). Negative for extremity weakness.  Hematological:  Negative for adenopathy.  Psychiatric/Behavioral:  Negative for confusion.     MEDICAL HISTORY:  Past Medical History:  Diagnosis Date   Anxiety    Arthritis    Depression    Hyperlipidemia    Hypertension    Liver mass    Metastatic colon cancer to liver ([place])    Moderate mitral insufficiency    Port-A-Cath in place    Seizures [facility])    Spinal stenosis    Ulcer    Urinary incontinence     SURGICAL HISTORY: Past Surgical History:  Procedure Laterality Date   ABDOMINAL HYSTERECTOMY     BACK SURGERY     due to polio   BREAST BIOPSY Bilateral    neg   BREAST BIOPSY Right 2011   neg/stereo   CARPAL TUNNEL RELEASE     CATARACT EXTRACTION Right 2020   COLONOSCOPY WITH PROPOFOL N/A 04/04/2021   Procedure: COLONOSCOPY WITH PROPOFOL;  Surgeon: Virgel Manifold, MD;  Location:  [place] [facility];  Service: Endoscopy;  Laterality: N/A;   ESOPHAGOGASTRODUODENOSCOPY (EGD) WITH PROPOFOL N/A 06/25/2022   Procedure: ESOPHAGOGASTRODUODENOSCOPY (EGD) WITH PROPOFOL;  Surgeon: Lin Landsman, MD;  Location: [facility] ENDOSCOPY;  Service: Gastroenterology;  Laterality: N/A;   EYE SURGERY     IR IMAGING GUIDED PORT INSERTION  10/09/2021    SOCIAL HISTORY: Social History   Socioeconomic History   Marital status: Divorced    Spouse name: Not on file   Number of children: Not on file   Years of education: Not on file   Highest education level: Not on file  Occupational History   Not on file  Tobacco Use   Smoking status: Never   Smokeless tobacco: Never  Vaping Use   Vaping Use: Never used  Substance and Sexual Activity   Alcohol use: Never   Drug use: No   Sexual activity: Not Currently  Other Topics Concern   Not  on file  Social History Narrative   Not on file   Social Determinants of Health   Financial Resource Strain: Low Risk  (01/12/2022)   Overall Financial Resource Strain (CARDIA)    Difficulty of Paying Living Expenses: Not very hard  Food Insecurity: No Food Insecurity (06/24/2022)   Hunger Vital Sign    Worried About Running Out of Food in the Last Year: Never true    Ran Out of Food in the Last Year: Never true  Transportation Needs: No Transportation Needs (06/24/2022)   PRAPARE - Hydrologist (Medical): No    Lack of Transportation (Non-Medical): No  Physical Activity: Inactive (01/12/2022)   Exercise Vital Sign    Days of Exercise per Week: 0 days    Minutes of Exercise per Session: 0 min  Stress: Stress Concern Present (01/12/2022)   [place]    Feeling of Stress : To some extent  Social Connections: Moderately Integrated (01/12/2022)   Social Connection and Isolation Panel [NHANES]    Frequency of Communication with Friends and Family: Three times a week    Frequency of Social Gatherings with Friends and Family: Three times a week    Attends Religious Services: 1 to 4 times per year    Active Member of Clubs or Organizations: No    Attends Archivist Meetings: 1 to 4 times per year    Marital Status: Widowed  Intimate Partner Violence: Not At Risk (06/24/2022)   Humiliation, Afraid, Rape, and Kick questionnaire    Fear of Current or Ex-Partner: No    Emotionally Abused: No    Physically Abused: No    Sexually Abused: No    FAMILY HISTORY: Family History  Problem Relation Age of Onset   Arthritis Mother    Heart disease Mother    Stroke Mother    Hypertension Mother    COPD Mother    Sudden death Sister    Other Father        unknown medical history   Breast cancer Neg Hx     ALLERGIES:  is allergic to penicillins.  MEDICATIONS:  Current Outpatient  Medications  Medication Sig Dispense Refill   acetaminophen (TYLENOL) 500 MG tablet Take 500 mg by mouth every 6 (six) hours as needed.     ALPRAZolam (XANAX) 0.5 MG tablet TAKE 1 TABLET BY MOUTH TWICE A DAY AS NEEDED FOR ANXIETY 60 tablet 0   aspirin EC 81 MG tablet Take 1 tablet (81 mg total)  by mouth daily. Swallow whole. 30 tablet 0   atorvastatin (LIPITOR) 10 MG tablet TAKE 1 TABLET BY MOUTH EVERY DAY 90 tablet 0   busPIRone (BUSPAR) 5 MG tablet Take 1 tablet (5 mg total) by mouth 3 (three) times daily. 270 tablet 1   Cyanocobalamin (VITAMIN B 12 PO) Take 500 mcg by mouth daily.     diclofenac Sodium (VOLTAREN) 1 % GEL Apply 4 g topically 4 (four) times daily. 4 g 0   docusate sodium (COLACE) 100 MG capsule Take 1 capsule (100 mg total) by mouth 2 (two) times daily. 60 capsule 0   FLUoxetine (PROZAC) 20 MG capsule TAKE 3 CAPSULES (60 MG TOTAL) BY MOUTH EVERY MORNING. 270 capsule 0   gabapentin (NEURONTIN) 100 MG capsule Take 200 mg by mouth 2 (two) times daily.     lidocaine-prilocaine (EMLA) cream Apply small amount of cream to port site 1-2 hour prior to chemo treatment. 30 g 3   loratadine (CLARITIN) 10 MG tablet Take 1 tablet (10 mg total) by mouth See admin instructions. Take 1 tablet daily for 4 days after each chemotherapy treatments. 90 tablet 0   meclizine (ANTIVERT) 12.5 MG tablet Take 1 tablet (12.5 mg total) by mouth 3 (three) times daily as needed for dizziness. 30 tablet 0   methocarbamol (ROBAXIN) 500 MG tablet TAKE 1 TABLET TWICE DAILY AS NEEDED FOR MUSCLE SPASM(S) 180 tablet 0   ondansetron (ZOFRAN) 8 MG tablet Take 1 tablet (8 mg total) by mouth 2 (two) times daily as needed (Nausea or vomiting). 30 tablet 1   pantoprazole (PROTONIX) 40 MG tablet Take 1 tablet (40 mg total) by mouth 2 (two) times daily before a meal. 60 tablet 0   triamterene-hydrochlorothiazide (MAXZIDE-25) 37.5-25 MG tablet TAKE 1 TABLET EVERY DAY 90 tablet 0   loperamide (IMODIUM) 2 MG capsule TAKE 1 CAP  BY MOUTH SEE ADMIN INSTRUCTIONS. INITIAL: 4 MG, FOLLOWED BY 2 MG AFTER EACH LOOSE STOOL MAXIMUM: 16 MG/DAY (Patient not taking: Reported on 07/01/2022) 60 capsule 0   magic mouthwash w/lidocaine SOLN Take 10 mLs by mouth 4 (four) times daily for 7 days. (Patient not taking: Reported on 07/01/2022) 50 mL 0   polyethylene glycol powder (GLYCOLAX/MIRALAX) 17 GM/SCOOP powder Take 17 g by mouth daily. (Patient not taking: Reported on 07/01/2022) 3350 g 1   potassium chloride SA (KLOR-CON M) 20 MEQ tablet Take 2 tablets (40 mEq total) by mouth daily. 90 tablet 0   sucralfate (CARAFATE) 1 GM/10ML suspension Take 10 mLs (1 g total) by mouth 4 (four) times daily -  with meals and at bedtime. (Patient not taking: Reported on 07/01/2022) 420 mL 0   No current facility-administered medications for this visit.   Facility-Administered Medications Ordered in Other Visits  Medication Dose Route Frequency Provider Last Rate Last Admin   0.9 %  sodium chloride infusion   Intravenous PRN [name], MD 10 mL/hr at 07/01/22 1150 Infusion Verify at 07/01/22 1150   heparin lock flush 100 UNIT/ML injection            palonosetron (ALOXI) 0.25 MG/5ML injection            prochlorperazine (COMPAZINE) 10 MG tablet              PHYSICAL EXAMINATION: ECOG PERFORMANCE STATUS: 2 - Symptomatic, <50% confined to bed Vitals:   07/01/22 0938 07/01/22 0939  BP:  109/70  Pulse:  79  Resp: 18 18  SpO2:  96%    Filed  Weights   07/01/22 0938  Weight: 146 lb (66.2 kg)      Physical Exam Constitutional:      General: She is not in acute distress.    Comments: Patient sits in wheelchair  HENT:     Head: Normocephalic and atraumatic.  Eyes:     General: No scleral icterus. Cardiovascular:     Rate and Rhythm: Normal rate and regular rhythm.     Heart sounds: Normal heart sounds.  Pulmonary:     Effort: Pulmonary effort is normal. No respiratory distress.     Breath sounds: No wheezing.  Abdominal:     General: Bowel  sounds are normal. There is no distension.     Palpations: Abdomen is soft.  Musculoskeletal:        General: No deformity. Normal range of motion.     Cervical back: Normal range of motion and neck supple.  Skin:    General: Skin is warm and dry.     Findings: No erythema or rash.  Neurological:     Mental Status: She is alert and oriented to person, place, and time. Mental status is at baseline.     Cranial Nerves: No cranial nerve deficit.     Coordination: Coordination normal.  Psychiatric:        Mood and Affect: Mood normal.     LABORATORY DATA:  I have reviewed the data as listed     Latest Ref Rng & Units 07/01/2022    9:11 AM 06/25/2022    3:35 AM 06/24/2022   11:44 AM  CBC  WBC 4.0 - 10.5 K/uL 4.4  9.2  10.7   Hemoglobin 12.0 - 15.0 g/dL 10.4  8.8  9.9   Hematocrit 36.0 - 46.0 % 32.5  28.1  32.1   Platelets 150 - 400 K/uL 114  78  93       Latest Ref Rng & Units 07/01/2022    9:11 AM 06/26/2022    6:38 AM 06/25/2022    3:35 AM  CMP  Glucose 70 - 99 mg/dL 102  97  80   BUN 8 - 23 mg/dL 10  <5  9   Creatinine 0.44 - 1.00 mg/dL 0.95  0.52  0.61   Sodium 135 - 145 mmol/L 138  142  143   Potassium 3.5 - 5.1 mmol/L 2.7  3.4  3.3   Chloride 98 - 111 mmol/L 102  107  110   CO2 22 - 32 mmol/L 28  28  26    Calcium 8.9 - 10.3 mg/dL 8.9  8.6  8.5   Total Protein 6.5 - 8.1 g/dL 7.1     Total Bilirubin 0.3 - 1.2 mg/dL 0.5     Alkaline Phos 38 - 126 U/L 84     AST 15 - 41 U/L 32     ALT 0 - 44 U/L 18        RADIOGRAPHIC STUDIES: I have personally reviewed the radiological images as listed and agreed with the findings in the report. CT CHEST W CONTRAST  Result Date: 06/25/2022 CLINICAL DATA:  Aspiration. EXAM: CT CHEST WITH CONTRAST TECHNIQUE: Multidetector CT imaging of the chest was performed during intravenous contrast administration. RADIATION DOSE REDUCTION: This exam was performed according to the departmental dose-optimization program which includes automated  exposure control, adjustment of the mA and/or kV according to patient size and/or use of iterative reconstruction technique. CONTRAST:  38m OMNIPAQUE IOHEXOL 300 MG/ML  SOLN COMPARISON:  CT of  the chest abdomen pelvis dated 04/13/2022. FINDINGS: Cardiovascular: There is no cardiomegaly or pericardial effusion. Mild atherosclerotic calcification of the thoracic aorta. No aneurysmal dilatation or dissection. The origins of the great vessels of the aortic arch and the central pulmonary arteries are patent. Left-sided Port-A-Cath with tip in the right atrium close to the cavoatrial junction. Mediastinum/Nodes: No hilar or mediastinal adenopathy. Calcified granuloma. The esophagus is grossly unremarkable. No mediastinal fluid collection. Lungs/Pleura: Patchy areas of airspace opacity in the right upper lobe, progressed since the prior radiograph may represent worsening or recurrent pneumonia. Clinical correlation and follow-up to resolution recommended. Partially calcified right middle lobe nodule, likely granuloma. Patchy areas of ground-glass density involving the left lower lobe and lingula similar or slightly progressed since the prior radiograph and may represent worsening infiltrate. Aspiration is not excluded. No pleural effusion or pneumothorax. The central airways are patent. Upper Abdomen: Probable gallbladder sludge or small stones. Fatty liver with a possible changes of cirrhosis. Small bilateral renal calculi. Areas of cortical irregularity and scarring of the kidneys. Musculoskeletal: Osteopenia with degenerative changes of the spine. 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 No acute osseous pathology. IMPRESSION: 1. Patchy areas of airspace opacity in the right upper lobe and left lower lobe and lingula slightly progressed since the prior radiograph and may represent worsening or recurrent pneumonia. Clinical correlation and follow-up to resolution  recommended. 2. Aortic Atherosclerosis (ICD10-I70.0). Electronically Signed   By: Anner Crete M.D.   On: 06/25/2022 18:06   CT SOFT TISSUE NECK W CONTRAST  Result Date: 06/25/2022 CLINICAL DATA:  Swallowing difficulties, palate weakness (CN 9) EXAM: CT NECK WITH CONTRAST TECHNIQUE: Multidetector CT imaging of the neck was performed using the standard protocol following the bolus administration of intravenous contrast. RADIATION DOSE REDUCTION: This exam was performed according to the departmental dose-optimization program which includes automated exposure control, adjustment of the mA and/or kV according to patient size and/or use of iterative reconstruction technique. CONTRAST:  6m OMNIPAQUE IOHEXOL 300 MG/ML  SOLN COMPARISON:  None Available. FINDINGS: Pharynx and larynx: Normal. No mass or swelling. Salivary glands: No inflammation, mass, or stone. Thyroid: Normal. Lymph nodes: None enlarged or abnormal density. Vascular: Patent. Limited intracranial: Negative. Visualized orbits: Negative. Mastoids and visualized paranasal sinuses: Clear. Skeleton: No acute osseous abnormality. Posterior fusion and decompression C3-C6. Upper chest: Please see same-day CT chest. Other: None. IMPRESSION: 1. No acute process in the neck to explain the patient's difficulty swallowing. 2.  For findings in the thorax, please see same day CT chest. Electronically Signed   By: AMerilyn BabaM.D.   On: 06/25/2022 18:02   CT ABDOMEN PELVIS W CONTRAST  Result Date: 06/24/2022 CLINICAL DATA:  Pain right lower quadrant, pelvic pain, vomiting, metastatic colon carcinoma EXAM: CT ABDOMEN AND PELVIS WITH CONTRAST TECHNIQUE: Multidetector CT imaging of the abdomen and pelvis was performed using the standard protocol following bolus administration of intravenous contrast. RADIATION DOSE REDUCTION: This exam was performed according to the departmental dose-optimization program which includes automated exposure control, adjustment of  the mA and/or kV according to patient size and/or use of iterative reconstruction technique. CONTRAST:  1026mOMNIPAQUE IOHEXOL 300 MG/ML  SOLN COMPARISON:  Previous studies including the examination of 04/13/2022 FINDINGS: Lower chest: Small patchy infiltrates are seen in lower lung fields, more so in the left lower lobe. There is calcified granuloma in left lower lung field. Hepatobiliary: There are a few low-density lesions in liver largest measuring 2 cm with no  significant interval change. There is fatty infiltration in liver. Gallbladder is distended. There is no wall thickening. There is no dilation of bile ducts. Pancreas: No focal abnormalities are seen. Spleen: Unremarkable. Adrenals/Urinary Tract: Adrenals are unremarkable. There is no hydronephrosis. 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 There is 2 mm calculus in the right kidney. There is 2 mm calculus in the left kidney. Urinary bladder is distended. Diverticulum is noted in the left posterolateral wall of the bladder. Stomach/Bowel: Stomach is not distended. Small bowel loops are not dilated. Appendix is not seen. There is no pericecal inflammation. Wall thickening in cecum appears less prominent. In the current study, maximum wall thickness in the medial aspect of the cecum measures 12 mm. Few scattered diverticula are seen in colon without signs of focal acute diverticulitis. Vascular/Lymphatic: Scattered arterial calcifications are seen. Reproductive: Uterus is not seen. There is 5.5 x 4.2 cm inhomogeneous soft tissue mass and fluid density in vaginal cuff with no significant interval change. Small amount of fluid is seen in the vaginal canal. Other: There is no ascites or pneumoperitoneum. Small umbilical hernia containing fat is seen. Musculoskeletal: There is minimal anterolisthesis at L4-L5 level. There is severe spinal stenosis and encroachment of neural foramina at L4-L5 level. There is  encroachment of neural foramina at L5-S1 level, more so on the right side. There is partial fusion of bodies of L5 and S1 vertebrae. IMPRESSION: There are low-density lesions in liver consistent with metastatic disease with no significant change. Wall thickening in the cecum appears less prominent. There is 5.5 x 4.2 cm mixed density lesion in vaginal cuff suggesting possible metastatic disease. There is no evidence of intestinal obstruction or pneumoperitoneum. There is no hydronephrosis. Small patchy infiltrates are seen in the lower lung fields suggesting atelectasis/pneumonia. Small bilateral renal stones. Diverticulosis of colon without signs of diverticulitis. 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 There is encroachment of neural foramina at L4-L5 and L5-S1 levels. Other findings as described in the body of the report. Electronically Signed   By: Elmer Picker M.D.   On: 06/24/2022 14:27   DG Chest 2 View  Result Date: 06/24/2022 CLINICAL DATA:  Dysphagia. EXAM: CHEST - 2 VIEW COMPARISON:  Chest x-ray 06/17/2021. FINDINGS: Similar mild enlargement the cardiac silhouette. Left IJ approach Port-A-Cath with the tip projecting at the superior right atrium. No consolidation. No visible pleural effusions or pneumothorax. Partially imaged ACDF. Polyarticular degenerative change. 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 IMPRESSION: No evidence of acute cardiopulmonary disease. Electronically Signed   By: Margaretha Sheffield M.D.   On: 06/24/2022 12:18   CT CHEST ABDOMEN PELVIS W CONTRAST  Result Date: 04/13/2022 CLINICAL DATA:  Colon mass, liver cancer diagnosed December 2022. * Tracking Code: BO * EXAM: CT CHEST, ABDOMEN, AND PELVIS WITH CONTRAST TECHNIQUE: Multidetector CT imaging of the chest, abdomen and pelvis was performed following the standard protocol during bolus administration of intravenous contrast. RADIATION DOSE REDUCTION: This exam was performed according  to the departmental dose-optimization program which includes automated exposure control, adjustment of the mA and/or kV according to patient size and/or use of iterative reconstruction technique. CONTRAST:  190m OMNIPAQUE IOHEXOL 300 MG/ML  SOLN COMPARISON:  12/22/2021. FINDINGS: CT CHEST FINDINGS Cardiovascular: Left IJ Port-A-Cath terminates in the right atrium. Atherosclerotic calcification of the aorta and aortic valve. Heart is enlarged. No pericardial effusion. Mediastinum/Nodes: No pathologically enlarged mediastinal, hilar or axillary lymph nodes. Calcified mediastinal and hilar lymph nodes. Esophagus is  unremarkable. Lungs/Pleura: Calcified granulomas. Calcified pleural plaques in the right hemithorax. New areas of ground-glass and consolidation in the right upper lobe, lingula and left lower lobe. No pleural fluid. Airway is unremarkable. Musculoskeletal: T8 and T9 vertebral body fusion with a kyphotic deformity. Degenerative changes in the spine. No worrisome lytic or sclerotic lesions. CT ABDOMEN PELVIS FINDINGS Hepatobiliary: Liver is decreased in attenuation diffusely. Mildly hypoattenuating lesions in the liver measure up to 2.3 cm in the left hepatic lobe, unchanged. Gallbladder is mildly distended. No biliary ductal dilatation. Pancreas: Negative. Spleen: Negative. Adrenals/Urinary Tract: Adrenal glands are unremarkable. Scarring in the kidneys bilaterally. Bilateral renal stones. Ureters are decompressed. Left posterolateral bladder diverticulum. Bladder is otherwise grossly unremarkable. Stomach/Bowel: Stomach, small bowel and appendix are unremarkable. Eccentric irregular wall thickening in the cecum (2/68), similar. Stool is seen in the majority of the colon, indicative of constipation. Vascular/Lymphatic: Atherosclerotic calcification of the aorta. No pathologically enlarged lymph nodes. Gastrohepatic ligament lymph nodes are subcentimeter in short axis size. Reproductive: Hysterectomy.  Heterogeneous mixed cystic and solid mass in the region of the vaginal cuff measures 3.8 x 5.6 cm, stable from 12/22/2021. Other: No free fluid.  Mesenteries and peritoneum are unremarkable. Musculoskeletal: Degenerative changes in the spine. No worrisome lytic or sclerotic lesions. IMPRESSION: 1. 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 2. Mixed cystic and solid mass in the region of the vaginal cuff, worrisome for malignancy. 3. New areas of patchy ground-glass and consolidation in the right upper lobe, lingula and left lower lobe, likely due to pneumonia. Treatment later organizing pneumonia not excluded. 4. Hepatic steatosis. 5. Bilateral renal stones. 6.  Aortic atherosclerosis (ICD10-I70.0). Electronically Signed   By: Lorin Picket M.D.   On: 04/13/2022 16:26

## 2022-07-02 ENCOUNTER — Telehealth: Payer: Self-pay | Admitting: *Deleted

## 2022-07-02 ENCOUNTER — Inpatient Hospital Stay: Payer: Medicare HMO | Admitting: Internal Medicine

## 2022-07-02 LAB — CEA: CEA: 3.1 ng/mL (ref 0.0–4.7)

## 2022-07-02 NOTE — Patient Outreach (Signed)
  Care Coordination [facility] Note Transition Care Management Unsuccessful Follow-up Telephone Call  Date of discharge and from where:  [phone] [facility]  Attempts:  1st Attempt  Reason for unsuccessful TCM follow-up call:  Left voice message  [place] [facility] [phone]

## 2022-07-02 NOTE — Patient Outreach (Signed)
  Care Coordination [facility] Note Transition Care Management Follow-up Telephone Call Date of discharge and from where: 06/26/2022 [facility] Encounter Outcome:  Pt. Refused  Patient very agitated and refused to answer question. Patient thought it was scam despite reassuring her that  it is not.    [place] [facility] [phone]

## 2022-07-03 ENCOUNTER — Inpatient Hospital Stay: Payer: Medicare HMO

## 2022-07-03 ENCOUNTER — Inpatient Hospital Stay: Payer: Medicare HMO | Admitting: Internal Medicine

## 2022-07-06 ENCOUNTER — Encounter: Payer: Self-pay | Admitting: Internal Medicine

## 2022-07-06 ENCOUNTER — Ambulatory Visit (INDEPENDENT_AMBULATORY_CARE_PROVIDER_SITE_OTHER): Payer: Medicare HMO | Admitting: Internal Medicine

## 2022-07-06 VITALS — BP 116/68 | HR 91 | Temp 96.8°F | Wt 148.0 lb

## 2022-07-06 DIAGNOSIS — K2960 Other gastritis without bleeding: Secondary | ICD-10-CM | POA: Diagnosis not present

## 2022-07-06 DIAGNOSIS — R1084 Generalized abdominal pain: Secondary | ICD-10-CM | POA: Diagnosis not present

## 2022-07-06 NOTE — Progress Notes (Signed)
Subjective:    Patient ID: Chelsea [name], female    DOB: 04/16/1946, 76 y.o.   MRN: [id number]  HPI  Patient presents to clinic today for TCM hospital follow-up.  She presented to the ER 9/20 with complaint of difficulty swallowing and abdominal pain.  GI was consulted.  CT soft tissue neck did not show any acute abnormalities.  CT chest showed aspiration pneumonia.  CT/Abd pelvis did not show any acute finding. She was admitted, underwent EGD on 9/21 which showed erosive gastritis.  This was felt to be secondary to uncontrolled acid reflux.  She was advised to continue Pantoprazole twice daily and added and Sucralfate.  She was discharged on 9/22, advised to follow-up with her PCP, GI and oncology.  Since that time, she has not had any difficulty swallowing, abdominal pain, nausea and vomiting.  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     ALPRAZolam (XANAX) 0.5 MG tablet TAKE 1 TABLET BY MOUTH TWICE A DAY AS NEEDED FOR ANXIETY 60 tablet 0   aspirin EC 81 MG tablet Take 1 tablet (81 mg total) by mouth daily. Swallow whole. 30 tablet 0   atorvastatin (LIPITOR) 10 MG tablet TAKE 1 TABLET BY MOUTH EVERY DAY 90 tablet 0   busPIRone (BUSPAR) 5 MG tablet Take 1 tablet (5 mg total) by mouth 3 (three) times daily. 270 tablet 1   Cyanocobalamin (VITAMIN B 12 PO) Take 500 mcg by mouth daily.     diclofenac Sodium (VOLTAREN) 1 % GEL Apply 4 g topically 4 (four) times daily. 4 g 0   docusate sodium (COLACE) 100 MG capsule Take 1 capsule (100 mg total) by mouth  2 (two) times daily. 60 capsule 0   FLUoxetine (PROZAC) 20 MG capsule TAKE 3 CAPSULES (60 MG TOTAL) BY MOUTH EVERY MORNING. 270 capsule 0   gabapentin (NEURONTIN) 100 MG capsule Take 200 mg by mouth 2 (two) times daily.     lidocaine-prilocaine (EMLA) cream Apply small amount of cream to port site 1-2 hour prior to chemo treatment. 30 g 3   loperamide (IMODIUM) 2 MG capsule TAKE 1 CAP BY MOUTH SEE ADMIN INSTRUCTIONS. INITIAL: 4 MG, FOLLOWED BY 2 MG AFTER EACH LOOSE STOOL MAXIMUM: 16 MG/DAY (Patient not taking: Reported on 07/01/2022) 60 capsule 0   loratadine (CLARITIN) 10 MG tablet Take 1 tablet (10 mg total) by mouth See admin instructions. Take 1 tablet daily for 4 days after each chemotherapy treatments. 90 tablet 0   meclizine (ANTIVERT) 12.5 MG tablet Take 1 tablet (12.5 mg total) by mouth 3 (three) times daily as needed for dizziness. 30 tablet 0   methocarbamol (ROBAXIN) 500 MG tablet TAKE 1 TABLET TWICE DAILY AS NEEDED FOR MUSCLE SPASM(S) 180 tablet 0   ondansetron (ZOFRAN) 8 MG tablet Take 1 tablet (8 mg total) by mouth 2 (two) times daily as needed (Nausea or vomiting). 30 tablet 1  pantoprazole (PROTONIX) 40 MG tablet Take 1 tablet (40 mg total) by mouth 2 (two) times daily before a meal. 60 tablet 0   polyethylene glycol powder (GLYCOLAX/MIRALAX) 17 GM/SCOOP powder Take 17 g by mouth daily. (Patient not taking: Reported on 07/01/2022) 3350 g 1   potassium chloride SA (KLOR-CON M) 20 MEQ tablet Take 2 tablets (40 mEq total) by mouth daily. 90 tablet 0   sucralfate (CARAFATE) 1 GM/10ML suspension Take 10 mLs (1 g total) by mouth 4 (four) times daily -  with meals and at bedtime. (Patient not taking: Reported on 07/01/2022) 420 mL 0   triamterene-hydrochlorothiazide (MAXZIDE-25) 37.5-25 MG tablet TAKE 1 TABLET EVERY DAY 90 tablet 0   No current facility-administered medications for this visit.   Facility-Administered Medications Ordered in Other Visits  Medication Dose Route Frequency  Provider Last Rate Last Admin   heparin lock flush 100 UNIT/ML injection            palonosetron (ALOXI) 0.25 MG/5ML injection            prochlorperazine (COMPAZINE) 10 MG tablet             Allergies  Allergen Reactions   Penicillins     "Everything turned black"    Family History  Problem Relation Age of Onset   Arthritis Mother    Heart disease Mother    Stroke Mother    Hypertension Mother    COPD Mother    Sudden death Sister    Other Father        unknown medical history   Breast cancer Neg Hx     Social History   Socioeconomic History   Marital status: Divorced    Spouse name: Not on file   Number of children: Not on file   Years of education: Not on file   Highest education level: Not on file  Occupational History   Not on file  Tobacco Use   Smoking status: Never   Smokeless tobacco: Never  Vaping Use   Vaping Use: Never used  Substance and Sexual Activity   Alcohol use: Never   Drug use: No   Sexual activity: Not Currently  Other Topics Concern   Not on file  Social History Narrative   Not on file   Social Determinants of Health   Financial Resource Strain: Low Risk  (01/12/2022)   Overall Financial Resource Strain (CARDIA)    Difficulty of Paying Living Expenses: Not very hard  Food Insecurity: No Food Insecurity (06/24/2022)   Hunger Vital Sign    Worried About Running Out of Food in the Last Year: Never true    Ran Out of Food in the Last Year: Never true  Transportation Needs: No Transportation Needs (06/24/2022)   PRAPARE - Hydrologist (Medical): No    Lack of Transportation (Non-Medical): No  Physical Activity: Inactive (01/12/2022)   Exercise Vital Sign    Days of Exercise per Week: 0 days    Minutes of Exercise per Session: 0 min  Stress: Stress Concern Present (01/12/2022)   [place]    Feeling of Stress : To some extent  Social  Connections: Moderately Integrated (01/12/2022)   Social Connection and Isolation Panel [NHANES]    Frequency of Communication with Friends and Family: Three times a week    Frequency of Social Gatherings with Friends and Family: Three times a week    Attends Religious Services:  1 to 4 times per year    Active Member of Clubs or Organizations: No    Attends Archivist Meetings: 1 to 4 times per year    Marital Status: Widowed  Intimate Partner Violence: Not At Risk (06/24/2022)   Humiliation, Afraid, Rape, and Kick questionnaire    Fear of Current or Ex-Partner: No    Emotionally Abused: No    Physically Abused: No    Sexually Abused: No     Constitutional: Denies fever, malaise, fatigue, headache or abrupt weight changes.  Respiratory: Denies difficulty breathing, shortness of breath, cough or sputum production.   Cardiovascular: Denies chest pain, chest tightness, palpitations or swelling in the hands or feet.  Gastrointestinal: Denies abdominal pain, bloating, constipation, diarrhea or blood in the stool.  Skin: Denies redness, rashes, lesions or ulcercations.    No other specific complaints in a complete review of systems (except as listed in HPI above).  Objective:   Physical Exam  BP 116/68 (BP Location: Right Arm, Patient Position: Sitting, Cuff Size: Normal)   Pulse 91   Temp (!) 96.8 F (36 C) (Temporal)   Wt 148 lb (67.1 kg)   SpO2 99%   BMI 32.03 kg/m   Wt Readings from Last 3 Encounters:  07/01/22 146 lb (66.2 kg)  06/25/22 153 lb 3.5 oz (69.5 kg)  06/17/22 153 lb 3.2 oz (69.5 kg)    General: Appears her stated age, obese, chronically ill appearing, in NAD. Skin: Warm, dry and intact. HEENT: Head: normal shape and size; Eyes: sclera white, no icterus, conjunctiva pink, PERRLA and EOMs intact; Neck:  Neck supple, trachea midline. No masses, lumps or thyromegaly present.  Cardiovascular: Normal rate and rhythm. S1,S2 noted.  No murmur, rubs or gallops  noted. No JVD or BLE edema. No carotid bruits noted. Pulmonary/Chest: Normal effort and positive vesicular breath sounds. No respiratory distress. No wheezes, rales or ronchi noted.  Abdomen: Soft and nontender. Normal bowel sounds.  Musculoskeletal:  No difficulty with gait.  Neurological: Alert and oriented   BMET    Component Value Date/Time   NA 138 07/01/2022 0911   K 2.7 (LL) 07/01/2022 0911   CL 102 07/01/2022 0911   CO2 28 07/01/2022 0911   GLUCOSE 102 (H) 07/01/2022 0911   BUN 10 07/01/2022 0911   CREATININE 0.95 07/01/2022 0911   CREATININE 0.87 08/13/2020 1129   CALCIUM 8.9 07/01/2022 0911   GFRNONAA >60 07/01/2022 0911   GFRNONAA 65 08/13/2020 1129   GFRAA 76 08/13/2020 1129    Lipid Panel     Component Value Date/Time   CHOL 148 04/28/2022 1105   TRIG 68 04/28/2022 1105   HDL 55 04/28/2022 1105   CHOLHDL 2.7 04/28/2022 1105   VLDL 18.4 02/08/2013 1638   LDLCALC 79 04/28/2022 1105    CBC    Component Value Date/Time   WBC 4.4 07/01/2022 0911   RBC 3.29 (L) 07/01/2022 0911   HGB 10.4 (L) 07/01/2022 0911   HCT 32.5 (L) 07/01/2022 0911   PLT 114 (L) 07/01/2022 0911   MCV 98.8 07/01/2022 0911   MCH 31.6 07/01/2022 0911   MCHC 32.0 07/01/2022 0911   RDW 17.4 (H) 07/01/2022 0911   LYMPHSABS 1.8 07/01/2022 0911   MONOABS 0.7 07/01/2022 0911   EOSABS 0.2 07/01/2022 0911   BASOSABS 0.0 07/01/2022 0911    Hgb A1C Lab Results  Component Value Date   HGBA1C 5.6 04/28/2022           Assessment &  Plan:  [facility] Follow-up for Erosive Gastritis, GERD:  Hospital notes, labs and imaging reviewed Her symptoms have completely resolved She will continue current meds as prescribed She has a follow-up with oncology scheduled   RTC in 3 months for follow-up of chronic conditions Webb Silversmith, NP

## 2022-07-06 NOTE — Patient Instructions (Signed)
Gastritis, Adult Gastritis is irritation and swelling (inflammation) of the stomach. There are two kinds of gastritis: Acute gastritis. This kind develops quickly. Chronic gastritis. This kind is much more common. It develops slowly and lasts for a long time. It is important to get help for this condition. If you do not get help, your stomach can bleed, and you can get sores (ulcers) in your stomach. What are the causes? This condition may be caused by: Germs that get to your stomach and cause an infection. Drinking too much alcohol. Medicines you are taking. Having too much acid in the stomach. Having a disease of the stomach. Other causes may include: An allergic reaction. Some cancer treatments (radiation). Smoking cigarettes or using products that contain nicotine or tobacco. In some cases, the cause of this condition is not known. What increases the risk? Having a disease of the intestines. Having Crohn's disease. Using aspirin or ibuprofen and other NSAIDs to treat other conditions. Stress. What are the signs or symptoms? Pain in your stomach. A burning feeling in your stomach. Feeling like you may vomit (nauseous). Vomiting or vomiting blood. Feeling too full after you eat. Weight loss. Bad breath. Blood in your poop (stool). In some cases, there are no symptoms. How is this treated? This condition is treated with medicines. The medicines that are used depend on what caused the condition. You may be given: Antibiotic medicine, if your condition was caused by an infection from germs. H2 blockers and similar medicines, if your condition was caused by too much acid in the stomach. Treatment may also include stopping the use of certain medicines, such as aspirin or ibuprofen. Follow these instructions at home: Medicines Take over-the-counter and prescription medicines only as told by your doctor. If you were prescribed an antibiotic medicine, take it as told by your doctor.  Do not stop taking it even if you start to feel better. Alcohol use Do not drink alcohol if: Your doctor tells you not to drink. You are pregnant, may be pregnant, or are planning to become pregnant. If you drink alcohol: Limit your use to: 0-1 drink a day for women. 0-2 drinks a day for men. Know how much alcohol is in your drink. In the [place] one drink equals one 12 oz bottle of beer (355 mL), one 5 oz glass of wine (148 mL), or one 1 oz glass of hard liquor (44 mL). General instructions  Eat small meals often, instead of large meals. Avoid foods and drinks that make you feel worse. Drink enough fluid to keep your pee (urine) pale yellow. Talk with your doctor about ways to manage stress. You can exercise or do deep breathing, meditation, or yoga. Do not smoke or use any products that contain nicotine or tobacco. If you need help quitting, ask your doctor. Keep all follow-up visits. Contact a doctor if: Your symptoms get worse. Your stomach pain gets worse. Your symptoms go away and then come back. You have a fever. Get help right away if: You vomit blood or something that looks like coffee grounds. You have black or dark red poop. You throw up any time you try to drink fluids. These symptoms may be an emergency. Get help right away. Call your local emergency services (911 in the [place] Do not wait to see if the symptoms will go away. Do not drive yourself to the hospital. Summary Gastritis is irritation and swelling (inflammation) of the stomach. You must get help for this condition. If you do   not get help, your stomach can bleed, and you can get sores (ulcers) in your stomach. You can be treated with medicines for germs or medicines to block too much acid in your stomach. This information is not intended to replace advice given to you by your health care provider. Make sure you discuss any questions you have with your health care provider. Document Revised: 01/25/2021 Document  Reviewed: 01/25/2021 Elsevier Patient Education  2023 Elsevier Inc.  

## 2022-07-08 ENCOUNTER — Other Ambulatory Visit: Payer: Medicare HMO

## 2022-07-08 ENCOUNTER — Ambulatory Visit: Payer: Medicare HMO

## 2022-07-08 ENCOUNTER — Ambulatory Visit: Payer: Medicare HMO | Admitting: Oncology

## 2022-07-09 ENCOUNTER — Ambulatory Visit
Admission: RE | Admit: 2022-07-09 | Discharge: 2022-07-09 | Disposition: A | Discharge status: Home / Self Care | Payer: Medicare HMO | Source: Ambulatory Visit | Attending: Internal Medicine | Admitting: Internal Medicine

## 2022-07-09 ENCOUNTER — Other Ambulatory Visit: Payer: Self-pay | Admitting: Internal Medicine

## 2022-07-09 DIAGNOSIS — Z780 Asymptomatic menopausal state: Secondary | ICD-10-CM

## 2022-07-09 DIAGNOSIS — Z1231 Encounter for screening mammogram for malignant neoplasm of breast: Secondary | ICD-10-CM | POA: Insufficient documentation

## 2022-07-09 DIAGNOSIS — F411 Generalized anxiety disorder: Secondary | ICD-10-CM

## 2022-07-09 NOTE — Telephone Encounter (Signed)
Requested medications are due for refill today.  yes  Requested medications are on the active medications list.  yes  Last refill. 06/16/2022 #60 0 rf  Future visit scheduled.   yes  Notes to clinic.  Refill not delegated.    Requested Prescriptions  Pending Prescriptions Disp Refills   ALPRAZolam (XANAX) 0.5 MG tablet [Pharmacy Med Name: ALPRAZOLAM 0.5 MG TABLET] 60 tablet 0    Sig: TAKE 1 TABLET BY MOUTH TWICE A DAY AS NEEDED FOR ANXIETY     Not Delegated - Psychiatry: Anxiolytics/Hypnotics 2 Failed - 07/09/2022  3:07 PM      Failed - This refill cannot be delegated      Failed - Urine Drug Screen completed in last 360 days      Passed - Patient is not pregnant      Passed - Valid encounter within last 6 months    Recent Outpatient Visits           3 days ago Erosive gastritis   [facility] [place], Coralie Keens, NP   2 months ago Encounter for general adult medical examination with abnormal findings   [facility] [place], Coralie Keens, NP   4 months ago Right leg weakness   [facility] [place], Coralie Keens, NP   8 months ago Pure hypercholesterolemia   [place], Coralie Keens, NP   10 months ago 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 [facility])   [facility], Coralie Keens, NP       Future Appointments             In 3 months [place], Coralie Keens, NP [facility]

## 2022-07-10 ENCOUNTER — Other Ambulatory Visit: Payer: Self-pay | Admitting: Internal Medicine

## 2022-07-10 DIAGNOSIS — N63 Unspecified lump in unspecified breast: Secondary | ICD-10-CM

## 2022-07-10 DIAGNOSIS — R928 Other abnormal and inconclusive findings on diagnostic imaging of breast: Secondary | ICD-10-CM

## 2022-07-10 MED ORDER — ALPRAZOLAM 0.5 MG PO TABS: ORAL_TABLET | ORAL | 0 refills | Status: DC

## 2022-07-10 NOTE — Addendum Note (Signed)
Addended by: Jearld Fenton on: 07/10/2022 10:01 AM   Modules accepted: Orders

## 2022-07-15 ENCOUNTER — Inpatient Hospital Stay: Payer: Medicare HMO

## 2022-07-15 ENCOUNTER — Inpatient Hospital Stay: Payer: Medicare HMO | Attending: Oncology | Admitting: Oncology

## 2022-07-15 ENCOUNTER — Encounter: Payer: Self-pay | Admitting: Oncology

## 2022-07-15 DIAGNOSIS — Z79899 Other long term (current) drug therapy: Secondary | ICD-10-CM | POA: Diagnosis not present

## 2022-07-15 DIAGNOSIS — E876 Hypokalemia: Secondary | ICD-10-CM | POA: Insufficient documentation

## 2022-07-15 DIAGNOSIS — C189 Malignant neoplasm of colon, unspecified: Secondary | ICD-10-CM

## 2022-07-15 DIAGNOSIS — Z5112 Encounter for antineoplastic immunotherapy: Secondary | ICD-10-CM | POA: Insufficient documentation

## 2022-07-15 DIAGNOSIS — C180 Malignant neoplasm of cecum: Secondary | ICD-10-CM | POA: Insufficient documentation

## 2022-07-15 DIAGNOSIS — C787 Secondary malignant neoplasm of liver and intrahepatic bile duct: Secondary | ICD-10-CM | POA: Insufficient documentation

## 2022-07-15 DIAGNOSIS — Z5111 Encounter for antineoplastic chemotherapy: Secondary | ICD-10-CM | POA: Diagnosis not present

## 2022-07-15 DIAGNOSIS — T451X5A Adverse effect of antineoplastic and immunosuppressive drugs, initial encounter: Secondary | ICD-10-CM

## 2022-07-15 DIAGNOSIS — Z5189 Encounter for other specified aftercare: Secondary | ICD-10-CM | POA: Insufficient documentation

## 2022-07-15 DIAGNOSIS — G629 Polyneuropathy, unspecified: Secondary | ICD-10-CM | POA: Diagnosis not present

## 2022-07-15 DIAGNOSIS — D701 Agranulocytosis secondary to cancer chemotherapy: Secondary | ICD-10-CM

## 2022-07-15 LAB — COMPREHENSIVE METABOLIC PANEL
ALT: 14 U/L (ref 0–44)
AST: 26 U/L (ref 15–41)
Albumin: 3.3 g/dL — ABNORMAL LOW (ref 3.5–5.0)
Alkaline Phosphatase: 59 U/L (ref 38–126)
Anion gap: 5 (ref 5–15)
BUN: 12 mg/dL (ref 8–23)
CO2: 29 mmol/L (ref 22–32)
Calcium: 9.3 mg/dL (ref 8.9–10.3)
Chloride: 105 mmol/L (ref 98–111)
Creatinine, Ser: 0.77 mg/dL (ref 0.44–1.00)
GFR, Estimated: >60 mL/min (ref >60)
Glucose, Bld: 122 mg/dL — ABNORMAL HIGH (ref 70–99)
Potassium: 3.4 mmol/L — ABNORMAL LOW (ref 3.5–5.1)
Sodium: 139 mmol/L (ref 135–145)
Total Bilirubin: 0.7 mg/dL (ref 0.3–1.2)
Total Protein: 7.2 g/dL (ref 6.5–8.1)

## 2022-07-15 LAB — CBC WITH DIFFERENTIAL/PLATELET
Abs Immature Granulocytes: 0.01 10*3/uL (ref 0.00–0.07)
Basophils Absolute: 0 10*3/uL (ref 0.0–0.1)
Basophils Relative: 1 %
Eosinophils Absolute: 0 10*3/uL (ref 0.0–0.5)
Eosinophils Relative: 1 %
HCT: 32.4 % — ABNORMAL LOW (ref 36.0–46.0)
Hemoglobin: 10.4 g/dL — ABNORMAL LOW (ref 12.0–15.0)
Immature Granulocytes: 0 %
Lymphocytes Relative: 41 %
Lymphs Abs: 1.5 10*3/uL (ref 0.7–4.0)
MCH: 32 pg (ref 26.0–34.0)
MCHC: 32.1 g/dL (ref 30.0–36.0)
MCV: 99.7 fL (ref 80.0–100.0)
Monocytes Absolute: 0.6 10*3/uL (ref 0.1–1.0)
Monocytes Relative: 16 %
Neutro Abs: 1.5 10*3/uL — ABNORMAL LOW (ref 1.7–7.7)
Neutrophils Relative %: 41 %
Platelets: 205 10*3/uL (ref 150–400)
RBC: 3.25 MIL/uL — ABNORMAL LOW (ref 3.87–5.11)
RDW: 18.7 % — ABNORMAL HIGH (ref 11.5–15.5)
WBC: 3.7 10*3/uL — ABNORMAL LOW (ref 4.0–10.5)
nRBC: 0 % (ref 0.0–0.2)

## 2022-07-15 LAB — PROTEIN, URINE, RANDOM: Total Protein, Urine: <6 mg/dL

## 2022-07-15 MED ORDER — LEUCOVORIN CALCIUM INJECTION 350 MG
700.0000 mg | Freq: Once | INTRAVENOUS | Status: AC
  Administered 2022-07-15: 700 mg via INTRAVENOUS
  Filled 2022-07-15: qty 35

## 2022-07-15 MED ORDER — SODIUM CHLORIDE 0.9 % IV SOLN
Freq: Once | INTRAVENOUS | Status: AC
  Filled 2022-07-15: qty 250

## 2022-07-15 MED ORDER — PALONOSETRON HCL INJECTION 0.25 MG/5ML
0.2500 mg | Freq: Once | INTRAVENOUS | Status: AC
  Administered 2022-07-15: 0.25 mg via INTRAVENOUS
  Filled 2022-07-15: qty 5

## 2022-07-15 MED ORDER — OXALIPLATIN CHEMO INJECTION 100 MG/20ML
65.0000 mg/m2 | Freq: Once | INTRAVENOUS | Status: AC
  Administered 2022-07-15: 115 mg via INTRAVENOUS
  Filled 2022-07-15: qty 20

## 2022-07-15 MED ORDER — DEXTROSE 5 % IV SOLN
INTRAVENOUS | Status: DC
  Filled 2022-07-15: qty 250

## 2022-07-15 MED ORDER — SODIUM CHLORIDE 0.9 % IV SOLN
350.0000 mg | Freq: Once | INTRAVENOUS | Status: AC
  Administered 2022-07-15: 350 mg via INTRAVENOUS
  Filled 2022-07-15: qty 14

## 2022-07-15 MED ORDER — SODIUM CHLORIDE 0.9 % IV SOLN
2400.0000 mg/m2 | INTRAVENOUS | Status: DC
  Administered 2022-07-15: 4300 mg via INTRAVENOUS
  Filled 2022-07-15: qty 86

## 2022-07-15 MED ORDER — PROCHLORPERAZINE MALEATE 10 MG PO TABS
10.0000 mg | ORAL_TABLET | Freq: Once | ORAL | Status: AC
  Administered 2022-07-15: 10 mg via ORAL
  Filled 2022-07-15: qty 1

## 2022-07-15 MED ORDER — FLUOROURACIL CHEMO INJECTION 2.5 GM/50ML
400.0000 mg/m2 | Freq: Once | INTRAVENOUS | Status: AC
  Administered 2022-07-15: 700 mg via INTRAVENOUS
  Filled 2022-07-15: qty 14

## 2022-07-15 MED ORDER — GABAPENTIN 100 MG PO CAPS: 200.0000 mg | ORAL_CAPSULE | Freq: Three times a day (TID) | ORAL | 1 refills | Status: DC

## 2022-07-15 NOTE — Assessment & Plan Note (Addendum)
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  No sufficient tissue for NGS Liquid biopsy showed KRAS12V, APC, TP53 mutation, TMB 4.  S/p 6 cycles of 5-FU /Bevacizumab--> CT showed mixed response--> FOLFOX/bevacizumab.-->CT imaging shows stable disease.  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  Stable disease.

## 2022-07-15 NOTE — Assessment & Plan Note (Addendum)
Continue potassium chloride 67mq daily.

## 2022-07-15 NOTE — Progress Notes (Signed)
Hematology/Oncology Progress note [phone] [phone]         Patient Care Team: Jearld Fenton, NP as PCP - General (Internal Medicine) Clent Jacks, RN as Oncology Nurse Navigator [name], MD as Consulting Physician (Oncology)  ASSESSMENT & PLAN:   Cancer Staging  Metastatic colon cancer to liver [facility]) Staging form: Colon and Rectum, AJCC 8th Edition - Clinical stage from 09/04/2021: Stage Unknown (cTX, cNX, cM1) - Signed by [name], MD on 05/13/2022   Metastatic colon cancer to liver [facility]) #Metastatic colon cancer to liver, 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  No sufficient tissue for NGS Liquid biopsy showed KRAS12V, APC, TP53 mutation, TMB 4.  S/p 6 cycles of 5-FU /Bevacizumab--> CT showed mixed response--> FOLFOX/bevacizumab.-->CT imaging shows stable disease.  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  Stable disease.  Chemotherapy induced neutropenia (HCC) patient gets prophylactic G-CSF with Udenyca on day 3.  Claritin 10 mg daily for 4 days.    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  Bilateral fingertips, right worse than left Recommend patient to increase gabapentin to 200 mg 3 times daily.  Prescription sent.  Hypokalemia Continue potassium chloride 48mq daily.   No orders of the defined types were placed in this encounter.   I spent time and answered all patient's questions regarding why resuming chemotherapy is recommended.  All questions were answered to her satisfaction.   Follow-up in 2 weeks lab [name]. All questions were answered. The patient knows to call the clinic with any problems, questions or concerns.  [name], PhD [facility] Hematology Oncology 07/15/2022   CHIEF COMPLAINTS/REASON FOR VISIT:  metastatic colon cancer  HISTORY OF PRESENTING ILLNESS:   Chelsea DROLLis a  76y.o.  female presents for management of metastatic colon cancer. Oncology History  [place]  08/21/2021 Imaging   CT abdomen pelvis with contrast  showed soft tissue mass at the base of cecum measuring up to 5 cm, slightly increased in size.  Interval development of multiple low-density lesions throughout the liver highly suggestive for metastatic disease.  Complex mass in the region of the vaginal cuff measuring up to 5.2 cm, increased in size.  Right ovary also appears more prominent on the current examination.  Moderate large volume of stool throughout the colon.  Mild urinary bladder wall thickening.  Colonic diverticulosis without evidence of active diverticulitis.  Aortic atherosclerosis   09/04/2021 Procedure   patient is status post left lobe liver lesion biopsy and pathology showed metastatic adenocarcinoma, compatible with colorectal primary.   09/04/2021 Cancer Staging   Staging form: Colon and Rectum, AJCC 8th Edition - Clinical stage from 09/04/2021: Stage Unknown (cTX, cNX, cM1) - Signed by YEarlie Server MD on 05/13/2022 Stage prefix: Initial diagnosis   09/16/2021 Initial Diagnosis   Metastatic colon cancer to liver ([facility] one liquid biopsy testing showed KRASG12V, APC TP53 TMB 4  Patient initially went to emergency room on 03/24/2021 for abdominal pain. 03/24/2021, CT scan of the abdomen showed marked bladder wall thickening with surrounding soft tissue stranding is noted.  Concerning for cystitis.  There is a lobulated mass between the posterior wall of the bladder and the rectum which appears to arise from the vaginal cuff.  This is indeterminate and difficult to characterize reflecting lack of IV contrast material.  Nonobstructing left renal calculus, left sacroiliitis, lumbar  spondylosis aortic atherosclerosis. 03/24/2021 CT pelvic showed abdominal [place] soft tissue of right cecum, concerning for colon carcinoma.  Colonoscopy recommended.  Low-attenuation mass in the region of vaginal cuff is again noted.  Diffuse urinary bladder wall thickening and mild bilateral hydroureter possibly cystitis.   [place] 03/24/2021 Lobulated solid 3.8 cm mass confirmed by ultrasound. This is most compatible with a neoplasm but remains indeterminate as to the origin as the epicenter seems to be outside of both the vaginal cuff and the adjacent colon, while the lesion appears inseparable from both. Extensive echogenic debris within the distended urinary bladder.   03/26/2021, patient was seen by Dr. Theora Gianotti for evaluation of colonic/vaginal cuff pelvic mass.  Patient also was referred to gastroenterology for colonoscopy.   03/26/2021 CEA 3.4.  CA125 9.7 04/04/2021, status post colonoscopy which showed a frond like /villous nonobstructing large mass was found in the cecum, this was biopsied.  Terminal ileum was briefly intubated and appeared normal.  Diverticulosis in the sigmoid colon.  The rectum, sigmoid colon, descending colon, transverse colon and ascending colon were normal.  Nonbleeding internal hemorrhoids. 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 Given that the biopsy may not be representative of the entire underlying lesion.  Patient was recommended to establish with surgeon for evaluation. Patient no showed for her surgery appointment and as well as her follow-up appointment with gastroenterology.  Per daughter, patient was having cervical spine surgery and she prioritized that over the colon surgery.   10/20/2021 - 01/07/2022 Chemotherapy    COLORECTAL 5-FU  + Bevacizumab q14d      10/20/2021 -  Chemotherapy   Patient is on Treatment Plan : COLORECTAL 5FU + Leucovorin (Modified [place]) + Bevacizumab q14d     12/22/2021 Imaging   CT chest abdomen  pelvis Decreased cecum mass, however increased size and size of metastatic lesions throughout the liver.  Interval enlargement of vaginal cuff mass. No mets in chest.    01/21/2022 -  Chemotherapy   COLORECTAL FOLFOX  + Bevacizumab q14d    patient was seen by gynecology oncology Dr. Fransisca Connors.  Biopsy of the vaginal mass was not recommended.Patient's case was also discussed on multidisciplinary tumor board.  IR potentially can try a biopsy if patient is willing to.I had a discussion with patient's daughter over the phone prior to this visit.  We discussed about option of adding oxaliplatin to 5-FU/bevacizumab and continue treatment of colon cancer, repeat CT scan short-term and if progression, repeat biopsy versus proceeding with vaginal mass biopsy.  Daughter prefers proceeding with chemotherapy and defer biopsy.   01/08/2022 patient has had a second opinion at [facility] and was seen by Dr. Reynaldo Minium .  Dr. Reynaldo Minium agrees with the current treatment plan with FOLFOX/bevacizumab.  He has ordered guardant 360 to see if she may be eligible for clinical trial in the future.  -   04/13/2022 Imaging    CT chest abdomen pelvis showed a cecal mass with stable hepatic metastasis.  Mixed cystic and solid mass in the region of the vaginal cuff, new area of patchy groundglass and consolidation in the right upper lobe, lingula and left lower lobe.  Likely due to pneumonia.  Hepatic steatosis.  Bilateral renal stones.  Aortic atherosclerosis   06/24/2022 Imaging   CT abdomen pelvis with contrast showed There are low-density lesions in liver consistent with metastatic disease with no  significant change. Wall thickening in the cecum appears less prominent. There is 5.5 x 4.2 cm mixed density lesion in vaginal cuff suggesting possible metastatic disease.   There is no evidence of intestinal obstruction or pneumoperitoneum.There is no hydronephrosis.   Small patchy infiltrates are seen in the lower lung fields suggesting  atelectasis/pneumonia.   Small bilateral renal stones. Diverticulosis of colon without signs of diverticulitis. 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 There is encroachment of neural foramina at L4-L5 and L5-S1 levels.     06/25/2022 Imaging   CT soft tissue neck with contrast showed1No acute process in the neck to explain the patient's difficulty swallowing.  CT chest contrast showed 1. Patchy areas of airspace opacity in the right upper lobe and left lower lobe and lingula slightly progressed since the prior radiograph and may represent worsening or recurrent pneumonia. Clinical correlation and follow-up to resolution recommended. 2. Aortic Atherosclerosis     06/24/2022 - 06/26/2022 patient was hospitalized due to difficulty swallowing, 1 day of abdominal pain.  Patient had extensive work-up including EGD which showed erosive gastritis.  No significant esophageal or gastric abnormality.  CT scan showed possible aspiration induced pneumonitis.  Antibiotics was held.  Today patient denies any fever, chills,.  Occasionally she has cough.   INTERVAL HISTORY Chelsea [name] is a 76 y.o. female who has above history reviewed by me today presents for follow up visit for management of metastatic colon cancer Patient was accompanied by son-in-law.  She reports feeling well after being off chemotherapy for 2 weeks.  Denies nausea vomiting diarrhea.  Difficulty swallowing has completely resolved. She has gained weight.  Pre-existing neuropathy is worse, numbness/tingling in her fingertips, right hand worse than left hand.  Review of Systems  Constitutional:  Positive for fatigue. Negative for appetite change, chills, fever and unexpected weight change.  HENT:   Negative for hearing loss and voice change.   Eyes:  Negative for eye problems.  Respiratory:  Negative for chest tightness and cough.   Cardiovascular:  Negative for chest pain.  Gastrointestinal:  Negative for  abdominal distention, abdominal pain, blood in stool and diarrhea.  Endocrine: Negative for hot flashes.  Genitourinary:  Negative for difficulty urinating and frequency.   Musculoskeletal:  Positive for back pain. Negative for arthralgias.  Skin:  Negative for itching and rash.  Neurological:  Positive for numbness (chornic finger tips). Negative for extremity weakness.  Hematological:  Negative for adenopathy.  Psychiatric/Behavioral:  Negative for confusion.     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)    Moderate mitral insufficiency    Port-A-Cath in place    Seizures [facility])    Spinal stenosis    Ulcer    Urinary incontinence     SURGICAL HISTORY: Past Surgical History:  Procedure Laterality Date   ABDOMINAL HYSTERECTOMY     BACK SURGERY     due to polio   BREAST BIOPSY Bilateral    neg   BREAST BIOPSY Right 2011   neg/stereo   CARPAL TUNNEL RELEASE     CATARACT EXTRACTION Right 2020   COLONOSCOPY WITH PROPOFOL N/A 04/04/2021   Procedure: COLONOSCOPY WITH PROPOFOL;  Surgeon: Virgel Manifold, MD;  Location: [facility] ENDOSCOPY;  Service: Endoscopy;  Laterality: N/A;   ESOPHAGOGASTRODUODENOSCOPY (EGD) WITH  PROPOFOL N/A 06/25/2022   Procedure: ESOPHAGOGASTRODUODENOSCOPY (EGD) WITH PROPOFOL;  Surgeon: Lin Landsman, MD;  Location: [facility] ENDOSCOPY;  Service: Gastroenterology;  Laterality: N/A;   EYE SURGERY     IR IMAGING GUIDED PORT INSERTION  10/09/2021    SOCIAL HISTORY: Social History   Socioeconomic History   Marital status: Divorced    Spouse name: Not on file   Number of children: Not on file   Years of education: Not on file   Highest education level: Not on file  Occupational History   Not on file  Tobacco Use   Smoking status: Never   Smokeless tobacco: Never  Vaping Use   Vaping Use: Never used   Substance and Sexual Activity   Alcohol use: Never   Drug use: No   Sexual activity: Not Currently  Other Topics Concern   Not on file  Social History Narrative   Not on file   Social Determinants of Health   Financial Resource Strain: Low Risk  (01/12/2022)   Overall Financial Resource Strain (CARDIA)    Difficulty of Paying Living Expenses: Not very hard  Food Insecurity: No Food Insecurity (06/24/2022)   Hunger Vital Sign    Worried About Running Out of Food in the Last Year: Never true    Ran Out of Food in the Last Year: Never true  Transportation Needs: No Transportation Needs (06/24/2022)   PRAPARE - Hydrologist (Medical): No    Lack of Transportation (Non-Medical): No  Physical Activity: Inactive (01/12/2022)   Exercise Vital Sign    Days of Exercise per Week: 0 days    Minutes of Exercise per Session: 0 min  Stress: Stress Concern Present (01/12/2022)   [place]    Feeling of Stress : To some extent  Social Connections: Moderately Integrated (01/12/2022)   Social Connection and Isolation Panel [NHANES]    Frequency of Communication with Friends and Family: Three times a week    Frequency of Social Gatherings with Friends and Family: Three times a week    Attends Religious Services: 1 to 4 times per year    Active Member of Clubs or Organizations: No    Attends Archivist Meetings: 1 to 4 times per year    Marital Status: Widowed  Intimate Partner Violence: Not At Risk (06/24/2022)   Humiliation, Afraid, Rape, and Kick questionnaire    Fear of Current or Ex-Partner: No    Emotionally Abused: No    Physically Abused: No    Sexually Abused: No    FAMILY HISTORY: Family History  Problem Relation Age of Onset   Arthritis Mother    Heart disease Mother    Stroke Mother    Hypertension Mother    COPD Mother    Sudden death Sister    Other Father        unknown  medical history   Breast cancer Neg Hx     ALLERGIES:  is allergic to penicillins.  MEDICATIONS:  Current Outpatient Medications  Medication Sig Dispense Refill   acetaminophen (TYLENOL) 500 MG tablet Take 500 mg by mouth every 6 (six) hours as needed.     ALPRAZolam (XANAX) 0.5 MG tablet TAKE 1 TABLET BY MOUTH TWICE A DAY AS NEEDED FOR ANXIETY 60 tablet 0   aspirin EC 81 MG tablet Take 1 tablet (81 mg total) by mouth daily. Swallow whole. (Patient taking differently: Take 325 mg by mouth  daily. Swallow whole.) 30 tablet 0   atorvastatin (LIPITOR) 10 MG tablet TAKE 1 TABLET BY MOUTH EVERY DAY 90 tablet 0   busPIRone (BUSPAR) 5 MG tablet Take 1 tablet (5 mg total) by mouth 3 (three) times daily. 270 tablet 1   Cyanocobalamin (VITAMIN B 12 PO) Take 500 mcg by mouth daily.     diclofenac Sodium (VOLTAREN) 1 % GEL Apply 4 g topically 4 (four) times daily. 4 g 0   docusate sodium (COLACE) 100 MG capsule Take 1 capsule (100 mg total) by mouth 2 (two) times daily. 60 capsule 0   FLUoxetine (PROZAC) 20 MG capsule TAKE 3 CAPSULES (60 MG TOTAL) BY MOUTH EVERY MORNING. 270 capsule 0   lidocaine-prilocaine (EMLA) cream Apply small amount of cream to port site 1-2 hour prior to chemo treatment. 30 g 3   loratadine (CLARITIN) 10 MG tablet Take 1 tablet (10 mg total) by mouth See admin instructions. Take 1 tablet daily for 4 days after each chemotherapy treatments. 90 tablet 0   meclizine (ANTIVERT) 12.5 MG tablet Take 1 tablet (12.5 mg total) by mouth 3 (three) times daily as needed for dizziness. 30 tablet 0   methocarbamol (ROBAXIN) 500 MG tablet TAKE 1 TABLET TWICE DAILY AS NEEDED FOR MUSCLE SPASM(S) 180 tablet 0   ondansetron (ZOFRAN) 8 MG tablet Take 1 tablet (8 mg total) by mouth 2 (two) times daily as needed (Nausea or vomiting). 30 tablet 1   pantoprazole (PROTONIX) 40 MG tablet Take 1 tablet (40 mg total) by mouth 2 (two) times daily before a meal. 60 tablet 0   polyethylene glycol powder  (GLYCOLAX/MIRALAX) 17 GM/SCOOP powder Take 17 g by mouth daily. 3350 g 1   potassium chloride SA (KLOR-CON M) 20 MEQ tablet Take 2 tablets (40 mEq total) by mouth daily. 90 tablet 0   sucralfate (CARAFATE) 1 GM/10ML suspension Take 10 mLs (1 g total) by mouth 4 (four) times daily -  with meals and at bedtime. 420 mL 0   triamterene-hydrochlorothiazide (MAXZIDE-25) 37.5-25 MG tablet TAKE 1 TABLET EVERY DAY 90 tablet 0   gabapentin (NEURONTIN) 100 MG capsule Take 2 capsules (200 mg total) by mouth 3 (three) times daily. 180 capsule 1   loperamide (IMODIUM) 2 MG capsule TAKE 1 CAP BY MOUTH SEE ADMIN INSTRUCTIONS. INITIAL: 4 MG, FOLLOWED BY 2 MG AFTER EACH LOOSE STOOL MAXIMUM: 16 MG/DAY (Patient not taking: Reported on 07/01/2022) 60 capsule 0   No current facility-administered medications for this visit.   Facility-Administered Medications Ordered in Other Visits  Medication Dose Route Frequency Provider Last Rate Last Admin   0.9 %  sodium chloride infusion   Intravenous Once [name], MD       bevacizumab-bvzr (ZIRABEV) 400 mg in sodium chloride 0.9 % 100 mL chemo infusion  5 mg/kg (Order-Specific) Intravenous Once [name], MD       dextrose 5 % solution   Intravenous Continuous [name], MD 10 mL/hr at 07/15/22 1015 New Bag at 07/15/22 1015   fluorouracil (ADRUCIL) 4,300 mg in sodium chloride 0.9 % 64 mL chemo infusion  2,400 mg/m2 (Order-Specific) Intravenous 1 day or 1 dose [name], MD       fluorouracil (ADRUCIL) chemo injection 700 mg  400 mg/m2 (Order-Specific) Intravenous Once [name], MD       heparin lock flush 100 UNIT/ML injection            leucovorin 700 mg in dextrose 5 % 250 mL infusion  700  mg Intravenous Once [name], MD 143 mL/hr at 07/15/22 1104 700 mg at 07/15/22 1104   oxaliplatin (ELOXATIN) 115 mg in dextrose 5 % 500 mL chemo infusion  65 mg/m2 (Order-Specific) Intravenous Once [name], MD 262 mL/hr at 07/15/22 1106 115 mg at 07/15/22 1106   palonosetron (ALOXI) 0.25  MG/5ML injection            prochlorperazine (COMPAZINE) 10 MG tablet              PHYSICAL EXAMINATION: ECOG PERFORMANCE STATUS: 2 - Symptomatic, <50% confined to bed Vitals:   07/15/22 0929  BP: 116/69  Pulse: 81  Resp: 18  Temp: (!) 97.1 F (36.2 C)    Filed Weights   07/15/22 0929  Weight: 151 lb 1.6 oz (68.5 kg)      Physical Exam Constitutional:      General: She is not in acute distress.    Comments: Patient sits in wheelchair  HENT:     Head: Normocephalic and atraumatic.  Eyes:     General: No scleral icterus. Cardiovascular:     Rate and Rhythm: Normal rate and regular rhythm.     Heart sounds: Normal heart sounds.  Pulmonary:     Effort: Pulmonary effort is normal. No respiratory distress.     Breath sounds: No wheezing.  Abdominal:     General: Bowel sounds are normal. There is no distension.     Palpations: Abdomen is soft.  Musculoskeletal:        General: No deformity. Normal range of motion.     Cervical back: Normal range of motion and neck supple.  Skin:    General: Skin is warm and dry.     Findings: No erythema or rash.  Neurological:     Mental Status: She is alert and oriented to person, place, and time. Mental status is at baseline.     Cranial Nerves: No cranial nerve deficit.     Coordination: Coordination normal.  Psychiatric:        Mood and Affect: Mood normal.     LABORATORY DATA:  I have reviewed the data as listed     Latest Ref Rng & Units 07/15/2022    9:07 AM 07/01/2022    9:11 AM 06/25/2022    3:35 AM  CBC  WBC 4.0 - 10.5 K/uL 3.7  4.4  9.2   Hemoglobin 12.0 - 15.0 g/dL 10.4  10.4  8.8   Hematocrit 36.0 - 46.0 % 32.4  32.5  28.1   Platelets 150 - 400 K/uL 205  114  78       Latest Ref Rng & Units 07/15/2022    9:07 AM 07/01/2022    9:11 AM 06/26/2022    6:38 AM  CMP  Glucose 70 - 99 mg/dL 122  102  97   BUN 8 - 23 mg/dL 12  10  <5   Creatinine 0.44 - 1.00 mg/dL 0.77  0.95  0.52   Sodium 135 - 145 mmol/L 139   138  142   Potassium 3.5 - 5.1 mmol/L 3.4  2.7  3.4   Chloride 98 - 111 mmol/L 105  102  107   CO2 22 - 32 mmol/L _0 Calcium 8.9 - 10.3 mg/dL 9.3  8.9  8.6   Total Protein 6.5 - 8.1 g/dL 7.2  7.1    Total Bilirubin 0.3 - 1.2 mg/dL 0.7  0.5    Alkaline Phos 38 -  126 U/L 59  84    AST 15 - 41 U/L 26  32    ALT 0 - 44 U/L 14  18       RADIOGRAPHIC STUDIES: I have personally reviewed the radiological images as listed and agreed with the findings in the report. DG Bone Density  Result Date: 07/13/2022 EXAM: DUAL X-RAY ABSORPTIOMETRY (DXA) FOR BONE MINERAL DENSITY IMPRESSION: Your patient Sheccid Lahmann completed a BMD test on 07/09/2022 using the [employer] (software version: 14.10) manufactured by [employer]. The following summarizes the results of our evaluation. Technologist:VLM PATIENT BIOGRAPHICAL: Name: Jernee, Murtaugh Patient ID: [id number] Birth Date: 01/11/1946 Height: 57.0 in. Gender: Female Exam Date: 07/09/2022 Weight: 148.0 lbs. Indications: History of kidney disease, Hysterectomy, Postmenopausal Fractures: Treatments: Protonix DENSITOMETRY RESULTS: Site         Region     Measured Date Measured Age WHO Classification Young Adult T-score BMD         %Change vs. Previous Significant Change (*) AP Spine L2-L4 6/09/2021 77.1 Normal 3.8 1.683 g/cm2 - - DualFemur Neck Left 07/09/2021 77.1 Normal 0.8 1.144 g/cm2 - - Left Forearm Radius 33% 07[age]09/2021 77.1 Normal 1.6 1.020 g/cm2 - - ASSESSMENT: The BMD measured at Femur Neck Left is 1.144 g/cm2 with a T-score of 0.8. This patient is considered normal according to [place] [facility]) criteria. L-1 was excluded due to degenerative changes. The scan quality is good. World Pharmacologist [facility]) criteria for post-menopausal, Caucasian Women: Normal:                   T-score at or above -1 SD Osteopenia/low bone mass: T-score between -1 and -2.5 SD Osteoporosis:             T-score at or below  -2.5 SD RECOMMENDATIONS: 1. All patients should optimize calcium and vitamin D intake. 2. Consider FDA-approved medical therapies in postmenopausal women and men aged 61 years and older, based on the following: a. A hip or vertebral(clinical or morphometric) fracture b. T-score < -2.5 at the femoral neck or spine after appropriate evaluation to exclude secondary causes c. Low bone mass (T-score between -1.0 and -2.5 at the femoral neck or spine) and a 10-year probability of a hip fracture > 3% or a 10-year probability of a major osteoporosis-related fracture > 20% based on the [place]-adapted WHO algorithm 3. Clinician judgment and/or patient preferences may indicate treatment for people with 10-year fracture probabilities above or below these levels FOLLOW-UP: People with diagnosed cases of osteoporosis or at high risk for fracture should have regular bone mineral density tests. For patients eligible for Medicare, routine testing is allowed once every 2 years. The testing frequency can be increased to one year for patients who have rapidly progressing disease, those who are receiving or discontinuing medical therapy to restore bone mass, or have additional risk factors. I have reviewed this report, and agree with the above findings. [facility] Electronically Signed   By: Zerita Boers M.D.   On: 07/13/2022 07:57   MM 3D SCREEN BREAST BILATERAL  Result Date: 07/10/2022 CLINICAL DATA:  Screening. EXAM: DIGITAL SCREENING BILATERAL MAMMOGRAM WITH TOMOSYNTHESIS AND CAD TECHNIQUE: Bilateral screening digital craniocaudal and mediolateral oblique mammograms were obtained. Bilateral screening digital breast tomosynthesis was performed. The images were evaluated with computer-aided detection. COMPARISON:  Previous exam(s). ACR Breast Density Category b: There are scattered areas of fibroglandular density. FINDINGS: In the left breast, a possible mass warrants further evaluation.  In the right breast, no  findings suspicious for malignancy. IMPRESSION: Further evaluation is suggested for a possible mass in the left breast. RECOMMENDATION: Diagnostic mammogram and possibly ultrasound of the left breast. (Code:[id number]) The patient will be contacted regarding the findings, and additional imaging will be scheduled. BI-RADS CATEGORY  0: Incomplete. Need additional imaging evaluation and/or prior mammograms for comparison. Electronically Signed   By: Kristopher Oppenheim M.D.   On: 07/10/2022 13:15   CT CHEST W CONTRAST  Result Date: 06/25/2022 CLINICAL DATA:  Aspiration. EXAM: CT CHEST WITH CONTRAST TECHNIQUE: Multidetector CT imaging of the chest was performed during intravenous contrast administration. RADIATION DOSE REDUCTION: This exam was performed according to the departmental dose-optimization program which includes automated exposure control, adjustment of the mA and/or kV according to patient size and/or use of iterative reconstruction technique. CONTRAST:  24m OMNIPAQUE IOHEXOL 300 MG/ML  SOLN COMPARISON:  CT of the chest abdomen pelvis dated 04/13/2022. FINDINGS: Cardiovascular: There is no cardiomegaly or pericardial effusion. Mild atherosclerotic calcification of the thoracic aorta. No aneurysmal dilatation or dissection. The origins of the great vessels of the aortic arch and the central pulmonary arteries are patent. Left-sided Port-A-Cath with tip in the right atrium close to the cavoatrial junction. Mediastinum/Nodes: No hilar or mediastinal adenopathy. Calcified granuloma. The esophagus is grossly unremarkable. No mediastinal fluid collection. Lungs/Pleura: Patchy areas of airspace opacity in the right upper lobe, progressed since the prior radiograph may represent worsening or recurrent pneumonia. Clinical correlation and follow-up to resolution recommended. Partially calcified right middle lobe nodule, likely granuloma. Patchy areas of ground-glass density involving the left lower lobe and lingula  similar or slightly progressed since the prior radiograph and may represent worsening infiltrate. Aspiration is not excluded. No pleural effusion or pneumothorax. The central airways are patent. Upper Abdomen: Probable gallbladder sludge or small stones. Fatty liver with a possible changes of cirrhosis. Small bilateral renal calculi. Areas of cortical irregularity and scarring of the kidneys. Musculoskeletal: Osteopenia with degenerative changes of the spine. 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 No acute osseous pathology. IMPRESSION: 1. Patchy areas of airspace opacity in the right upper lobe and left lower lobe and lingula slightly progressed since the prior radiograph and may represent worsening or recurrent pneumonia. Clinical correlation and follow-up to resolution recommended. 2. Aortic Atherosclerosis (ICD10-I70.0). Electronically Signed   By: AAnner CreteM.D.   On: 06/25/2022 18:06   CT SOFT TISSUE NECK W CONTRAST  Result Date: 06/25/2022 CLINICAL DATA:  Swallowing difficulties, palate weakness (CN 9) EXAM: CT NECK WITH CONTRAST TECHNIQUE: Multidetector CT imaging of the neck was performed using the standard protocol following the bolus administration of intravenous contrast. RADIATION DOSE REDUCTION: This exam was performed according to the departmental dose-optimization program which includes automated exposure control, adjustment of the mA and/or kV according to patient size and/or use of iterative reconstruction technique. CONTRAST:  714mOMNIPAQUE IOHEXOL 300 MG/ML  SOLN COMPARISON:  None Available. FINDINGS: Pharynx and larynx: Normal. No mass or swelling. Salivary glands: No inflammation, mass, or stone. Thyroid: Normal. Lymph nodes: None enlarged or abnormal density. Vascular: Patent. Limited intracranial: Negative. Visualized orbits: Negative. Mastoids and visualized paranasal sinuses: Clear. Skeleton: No acute osseous  abnormality. Posterior fusion and decompression C3-C6. Upper chest: Please see same-day CT chest. Other: None. IMPRESSION: 1. No acute process in the neck to explain the patient's difficulty swallowing. 2.  For findings in the thorax, please see same day CT chest. Electronically Signed   By: AlBryson Ha  Vasan M.D.   On: 06/25/2022 18:02   CT ABDOMEN PELVIS W CONTRAST  Result Date: 06/24/2022 CLINICAL DATA:  Pain right lower quadrant, pelvic pain, vomiting, metastatic colon carcinoma EXAM: CT ABDOMEN AND PELVIS WITH CONTRAST TECHNIQUE: Multidetector CT imaging of the abdomen and pelvis was performed using the standard protocol following bolus administration of intravenous contrast. RADIATION DOSE REDUCTION: This exam was performed according to the departmental dose-optimization program which includes automated exposure control, adjustment of the mA and/or kV according to patient size and/or use of iterative reconstruction technique. CONTRAST:  162m OMNIPAQUE IOHEXOL 300 MG/ML  SOLN COMPARISON:  Previous studies including the examination of 04/13/2022 FINDINGS: Lower chest: Small patchy infiltrates are seen in lower lung fields, more so in the left lower lobe. There is calcified granuloma in left lower lung field. Hepatobiliary: There are a few low-density lesions in liver largest measuring 2 cm with no significant interval change. There is fatty infiltration in liver. Gallbladder is distended. There is no wall thickening. There is no dilation of bile ducts. Pancreas: No focal abnormalities are seen. Spleen: Unremarkable. Adrenals/Urinary Tract: Adrenals are unremarkable. There is no hydronephrosis. 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 There is 2 mm calculus in the right kidney. There is 2 mm calculus in the left kidney. Urinary bladder is distended. Diverticulum is noted in the left posterolateral wall of the bladder. Stomach/Bowel: Stomach is not distended. Small  bowel loops are not dilated. Appendix is not seen. There is no pericecal inflammation. Wall thickening in cecum appears less prominent. In the current study, maximum wall thickness in the medial aspect of the cecum measures 12 mm. Few scattered diverticula are seen in colon without signs of focal acute diverticulitis. Vascular/Lymphatic: Scattered arterial calcifications are seen. Reproductive: Uterus is not seen. There is 5.5 x 4.2 cm inhomogeneous soft tissue mass and fluid density in vaginal cuff with no significant interval change. Small amount of fluid is seen in the vaginal canal. Other: There is no ascites or pneumoperitoneum. Small umbilical hernia containing fat is seen. Musculoskeletal: There is minimal anterolisthesis at L4-L5 level. There is severe spinal stenosis and encroachment of neural foramina at L4-L5 level. There is encroachment of neural foramina at L5-S1 level, more so on the right side. There is partial fusion of bodies of L5 and S1 vertebrae. IMPRESSION: There are low-density lesions in liver consistent with metastatic disease with no significant change. Wall thickening in the cecum appears less prominent. There is 5.5 x 4.2 cm mixed density lesion in vaginal cuff suggesting possible metastatic disease. There is no evidence of intestinal obstruction or pneumoperitoneum. There is no hydronephrosis. Small patchy infiltrates are seen in the lower lung fields suggesting atelectasis/pneumonia. Small bilateral renal stones. Diverticulosis of colon without signs of diverticulitis. 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 There is encroachment of neural foramina at L4-L5 and L5-S1 levels. Other findings as described in the body of the report. Electronically Signed   By: PElmer PickerM.D.   On: 06/24/2022 14:27   DG Chest 2 View  Result Date: 06/24/2022 CLINICAL DATA:  Dysphagia. EXAM: CHEST - 2 VIEW COMPARISON:  Chest x-ray 06/17/2021. FINDINGS: Similar mild  enlargement the cardiac silhouette. Left IJ approach Port-A-Cath with the tip projecting at the superior right atrium. No consolidation. No visible pleural effusions or pneumothorax. Partially imaged ACDF. Polyarticular degenerative change. 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 IMPRESSION: No evidence of acute cardiopulmonary disease. Electronically Signed   By: FMargaretha Sheffield  M.D.   On: 06/24/2022 12:18

## 2022-07-15 NOTE — Assessment & Plan Note (Signed)
patient gets prophylactic G-CSF with Udenyca on day 3.  Claritin 10 mg daily for 4 days.   

## 2022-07-15 NOTE — Progress Notes (Signed)
Pt reports that she has been feeling weak since yesterday. Pt also reports that she has been having pain to her right side of body (leg and shoulder) and pain is not getting better.

## 2022-07-15 NOTE — Assessment & Plan Note (Signed)
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  Bilateral fingertips, right worse than left Recommend patient to increase gabapentin to 200 mg 3 times daily.  Prescription sent.

## 2022-07-15 NOTE — Assessment & Plan Note (Signed)
Chemotherapy plan as listed above 

## 2022-07-17 ENCOUNTER — Inpatient Hospital Stay: Payer: Medicare HMO

## 2022-07-17 DIAGNOSIS — C180 Malignant neoplasm of cecum: Secondary | ICD-10-CM | POA: Diagnosis not present

## 2022-07-17 DIAGNOSIS — E876 Hypokalemia: Secondary | ICD-10-CM | POA: Diagnosis not present

## 2022-07-17 DIAGNOSIS — Z79899 Other long term (current) drug therapy: Secondary | ICD-10-CM | POA: Diagnosis not present

## 2022-07-17 DIAGNOSIS — Z5111 Encounter for antineoplastic chemotherapy: Secondary | ICD-10-CM | POA: Diagnosis not present

## 2022-07-17 DIAGNOSIS — Z5112 Encounter for antineoplastic immunotherapy: Secondary | ICD-10-CM | POA: Diagnosis not present

## 2022-07-17 DIAGNOSIS — C189 Malignant neoplasm of colon, unspecified: Secondary | ICD-10-CM

## 2022-07-17 DIAGNOSIS — Z5189 Encounter for other specified aftercare: Secondary | ICD-10-CM | POA: Diagnosis not present

## 2022-07-17 DIAGNOSIS — C787 Secondary malignant neoplasm of liver and intrahepatic bile duct: Secondary | ICD-10-CM | POA: Diagnosis not present

## 2022-07-17 LAB — CEA: CEA: 2.7 ng/mL (ref 0.0–4.7)

## 2022-07-17 MED ORDER — PEGFILGRASTIM-CBQV 6 MG/0.6ML ~~LOC~~ SOSY
6.0000 mg | PREFILLED_SYRINGE | Freq: Once | SUBCUTANEOUS | Status: AC
  Administered 2022-07-17: 6 mg via SUBCUTANEOUS

## 2022-07-17 MED ORDER — SODIUM CHLORIDE 0.9% FLUSH
10.0000 mL | INTRAVENOUS | Status: DC | PRN
  Administered 2022-07-17: 10 mL
  Filled 2022-07-17: qty 10

## 2022-07-17 MED ORDER — HEPARIN SOD (PORK) LOCK FLUSH 100 UNIT/ML IV SOLN
500.0000 [IU] | Freq: Once | INTRAVENOUS | Status: AC | PRN
  Administered 2022-07-17: 500 [IU]
  Filled 2022-07-17: qty 5

## 2022-07-29 ENCOUNTER — Encounter: Payer: Self-pay | Admitting: Oncology

## 2022-07-29 ENCOUNTER — Inpatient Hospital Stay: Payer: Medicare HMO

## 2022-07-29 ENCOUNTER — Inpatient Hospital Stay (HOSPITAL_BASED_OUTPATIENT_CLINIC_OR_DEPARTMENT_OTHER): Payer: Medicare HMO | Admitting: Oncology

## 2022-07-29 VITALS — BP 130/80 | HR 81 | Temp 96.6°F | Wt 147.0 lb

## 2022-07-29 DIAGNOSIS — Z5111 Encounter for antineoplastic chemotherapy: Secondary | ICD-10-CM | POA: Diagnosis not present

## 2022-07-29 DIAGNOSIS — Z5112 Encounter for antineoplastic immunotherapy: Secondary | ICD-10-CM | POA: Diagnosis not present

## 2022-07-29 DIAGNOSIS — D6481 Anemia due to antineoplastic chemotherapy: Secondary | ICD-10-CM

## 2022-07-29 DIAGNOSIS — G629 Polyneuropathy, unspecified: Secondary | ICD-10-CM

## 2022-07-29 DIAGNOSIS — Z5189 Encounter for other specified aftercare: Secondary | ICD-10-CM | POA: Diagnosis not present

## 2022-07-29 DIAGNOSIS — Z79899 Other long term (current) drug therapy: Secondary | ICD-10-CM | POA: Diagnosis not present

## 2022-07-29 DIAGNOSIS — E876 Hypokalemia: Secondary | ICD-10-CM | POA: Diagnosis not present

## 2022-07-29 DIAGNOSIS — T451X5A Adverse effect of antineoplastic and immunosuppressive drugs, initial encounter: Secondary | ICD-10-CM

## 2022-07-29 DIAGNOSIS — C180 Malignant neoplasm of cecum: Secondary | ICD-10-CM | POA: Diagnosis not present

## 2022-07-29 DIAGNOSIS — C787 Secondary malignant neoplasm of liver and intrahepatic bile duct: Secondary | ICD-10-CM

## 2022-07-29 DIAGNOSIS — C189 Malignant neoplasm of colon, unspecified: Secondary | ICD-10-CM

## 2022-07-29 LAB — COMPREHENSIVE METABOLIC PANEL
ALT: 10 U/L (ref 0–44)
AST: 25 U/L (ref 15–41)
Albumin: 3.4 g/dL — ABNORMAL LOW (ref 3.5–5.0)
Alkaline Phosphatase: 90 U/L (ref 38–126)
Anion gap: 7 (ref 5–15)
BUN: 5 mg/dL — ABNORMAL LOW (ref 8–23)
CO2: 27 mmol/L (ref 22–32)
Calcium: 9.2 mg/dL (ref 8.9–10.3)
Chloride: 105 mmol/L (ref 98–111)
Creatinine, Ser: 0.79 mg/dL (ref 0.44–1.00)
GFR, Estimated: >60 mL/min (ref >60)
Glucose, Bld: 106 mg/dL — ABNORMAL HIGH (ref 70–99)
Potassium: 3.4 mmol/L — ABNORMAL LOW (ref 3.5–5.1)
Sodium: 139 mmol/L (ref 135–145)
Total Bilirubin: 0.4 mg/dL (ref 0.3–1.2)
Total Protein: 7.3 g/dL (ref 6.5–8.1)

## 2022-07-29 LAB — CBC WITH DIFFERENTIAL/PLATELET
Abs Immature Granulocytes: 0.04 10*3/uL (ref 0.00–0.07)
Basophils Absolute: 0 10*3/uL (ref 0.0–0.1)
Basophils Relative: 1 %
Eosinophils Absolute: 0.2 10*3/uL (ref 0.0–0.5)
Eosinophils Relative: 2 %
HCT: 33.2 % — ABNORMAL LOW (ref 36.0–46.0)
Hemoglobin: 10.5 g/dL — ABNORMAL LOW (ref 12.0–15.0)
Immature Granulocytes: 1 %
Lymphocytes Relative: 31 %
Lymphs Abs: 2.6 10*3/uL (ref 0.7–4.0)
MCH: 32.4 pg (ref 26.0–34.0)
MCHC: 31.6 g/dL (ref 30.0–36.0)
MCV: 102.5 fL — ABNORMAL HIGH (ref 80.0–100.0)
Monocytes Absolute: 0.9 10*3/uL (ref 0.1–1.0)
Monocytes Relative: 11 %
Neutro Abs: 4.6 10*3/uL (ref 1.7–7.7)
Neutrophils Relative %: 54 %
Platelets: 147 10*3/uL — ABNORMAL LOW (ref 150–400)
RBC: 3.24 MIL/uL — ABNORMAL LOW (ref 3.87–5.11)
RDW: 18.5 % — ABNORMAL HIGH (ref 11.5–15.5)
WBC: 8.3 10*3/uL (ref 4.0–10.5)
nRBC: 0 % (ref 0.0–0.2)

## 2022-07-29 LAB — PROTEIN, URINE, RANDOM: Total Protein, Urine: <6 mg/dL

## 2022-07-29 MED ORDER — SODIUM CHLORIDE 0.9% FLUSH
10.0000 mL | Freq: Once | INTRAVENOUS | Status: AC
  Administered 2022-07-29: 10 mL via INTRAVENOUS
  Filled 2022-07-29: qty 10

## 2022-07-29 MED ORDER — HEPARIN SOD (PORK) LOCK FLUSH 100 UNIT/ML IV SOLN
500.0000 [IU] | Freq: Once | INTRAVENOUS | Status: AC
  Administered 2022-07-29: 500 [IU] via INTRAVENOUS
  Filled 2022-07-29: qty 5

## 2022-07-29 NOTE — Progress Notes (Signed)
Hematology/Oncology Progress note [phone] [phone]         Patient Care Team: Jearld Fenton, NP as PCP - General (Internal Medicine) Clent Jacks, RN as Oncology Nurse Navigator [name], MD as Consulting Physician (Oncology)  ASSESSMENT & PLAN:   Cancer Staging  Metastatic colon cancer to liver [facility]) Staging form: Colon and Rectum, AJCC 8th Edition - Clinical stage from 09/04/2021: Stage Unknown (cTX, cNX, cM1) - Signed by [name], MD on 05/13/2022   Metastatic colon cancer to liver [facility]) #Metastatic colon cancer to liver, 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  No sufficient tissue for NGS Liquid biopsy showed KRAS12V, APC, TP53 mutation, TMB 4.  S/p 6 cycles of 5-FU /Bevacizumab--> CT showed mixed response--> FOLFOX/bevacizumab.-->CT imaging shows stable disease.  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  Stable disease.  Anemia due to antineoplastic chemotherapy Hemoglobin is stable.     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  Bilateral fingertips, right worse than left gabapentin to 200 mg 3 times daily.   Hypokalemia Continue potassium chloride 75mq daily.    Orders Placed This Encounter  Procedures   Protein, urine, random    Standing Status:   Future    Standing Expiration Date:   09/24/2022   CEA    Standing Status:   Future    Standing Expiration Date:   08/25/2023   CBC with Differential    Standing Status:   Future    Standing Expiration Date:   08/25/2023   Comprehensive metabolic panel    Standing Status:   Future    Standing Expiration Date:   08/25/2023   Protein, urine,  random    Standing Status:   Future    Standing Expiration Date:   10/08/2022   CEA    Standing Status:   Future    Standing Expiration Date:   09/08/2023   CBC with Differential    Standing Status:   Future    Standing Expiration Date:   09/08/2023   Comprehensive metabolic panel    Standing Status:   Future    Standing Expiration Date:   09/08/2023   Protein, urine, random    Standing Status:   Future    Standing Expiration Date:   10/22/2022   CEA    Standing Status:   Future    Standing Expiration Date:   09/22/2023   CBC with Differential    Standing Status:   Future    Standing Expiration Date:   09/22/2023   Comprehensive metabolic panel    Standing Status:   Future    Standing Expiration Date:   09/22/2023   Protein, urine, random    Standing Status:   Future    Standing Expiration Date:   11/05/2022   CEA    Standing Status:   Future    Standing Expiration Date:   10/06/2023   CBC with Differential    Standing Status:   Future    Standing Expiration Date:   10/06/2023   Comprehensive metabolic panel    Standing Status:   Future    Standing Expiration Date:   10/06/2023   Protein, urine, random    Standing Status:   Future  Standing Expiration Date:   09/10/2022   CEA    Standing Status:   Future    Standing Expiration Date:   08/11/2023   CBC with Differential    Standing Status:   Future    Standing Expiration Date:   08/11/2023   Comprehensive metabolic panel    Standing Status:   Future    Standing Expiration Date:   08/11/2023     I spent time and answered all patient's questions regarding why resuming chemotherapy is recommended.  All questions were answered to her satisfaction.   Follow-up in  1 week  lab [name]. All questions were answered. The patient knows to call the clinic with any problems, questions or concerns.  [name], MD, PhD [facility] Hematology Oncology 07/29/2022   CHIEF COMPLAINTS/REASON FOR VISIT:   metastatic colon cancer  HISTORY OF PRESENTING ILLNESS:   Chelsea [name] is a  76 y.o.  female presents for management of metastatic colon cancer. Oncology History  Metastatic colon cancer to liver ([place])  08/21/2021 Imaging   CT abdomen pelvis with contrast  showed soft tissue mass at the base of cecum measuring up to 5 cm, slightly increased in size.  Interval development of multiple low-density lesions throughout the liver highly suggestive for metastatic disease.  Complex mass in the region of the vaginal cuff measuring up to 5.2 cm, increased in size.  Right ovary also appears more prominent on the current examination.  Moderate large volume of stool throughout the colon.  Mild urinary bladder wall thickening.  Colonic diverticulosis without evidence of active diverticulitis.  Aortic atherosclerosis   09/04/2021 Procedure   patient is status post left lobe liver lesion biopsy and pathology showed metastatic adenocarcinoma, compatible with colorectal primary.   09/04/2021 Cancer Staging   Staging form: Colon and Rectum, AJCC 8th Edition - Clinical stage from 09/04/2021: Stage Unknown (cTX, cNX, cM1) - Signed by [name], MD on 05/13/2022 Stage prefix: Initial diagnosis   09/16/2021 Initial Diagnosis   Metastatic colon cancer to liver [facility] one liquid biopsy testing showed KRASG12V, APC TP53 TMB 4  Patient initially went to emergency room on 03/24/2021 for abdominal pain. 03/24/2021, CT scan of the abdomen showed marked bladder wall thickening with surrounding soft tissue stranding is noted.  Concerning for cystitis.  There is a lobulated mass between the posterior wall of the bladder and the rectum which appears to arise from the vaginal cuff.  This is indeterminate and difficult to characterize reflecting lack of IV contrast material.  Nonobstructing left renal calculus, left sacroiliitis, lumbar spondylosis aortic atherosclerosis. 03/24/2021 CT pelvic showed abdominal [place]  soft tissue of right cecum, concerning for colon carcinoma.  Colonoscopy recommended.  Low-attenuation mass in the region of vaginal cuff is again noted.  Diffuse urinary bladder wall thickening and mild bilateral hydroureter possibly cystitis.   [place] 03/24/2021 Lobulated solid 3.8 cm mass confirmed by ultrasound. This is most compatible with a neoplasm but remains indeterminate as to the origin as the epicenter seems to be outside of both the vaginal cuff and the adjacent colon, while the lesion appears inseparable from both. Extensive echogenic debris within the distended urinary bladder.   03/26/2021, patient was seen by Dr. Theora Gianotti for evaluation of colonic/vaginal cuff pelvic mass.  Patient also was referred to gastroenterology for colonoscopy.   03/26/2021 CEA 3.4.  CA125 9.7 04/04/2021, status post colonoscopy which showed a frond like /villous nonobstructing large mass was found in the  cecum, this was biopsied.  Terminal ileum was briefly intubated and appeared normal.  Diverticulosis in the sigmoid colon.  The rectum, sigmoid colon, descending colon, transverse colon and ascending colon were normal.  Nonbleeding internal hemorrhoids. 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 Given that the biopsy may not be representative of the entire underlying lesion.  Patient was recommended to establish with surgeon for evaluation. Patient no showed for her surgery appointment and as well as her follow-up appointment with gastroenterology.  Per daughter, patient was having cervical spine surgery and she prioritized that over the colon surgery.   10/20/2021 - 01/07/2022 Chemotherapy    COLORECTAL 5-FU  + Bevacizumab q14d      10/20/2021 -  Chemotherapy   Patient is on Treatment Plan : COLORECTAL 5FU + Leucovorin (Modified [place]) + Bevacizumab q14d     12/22/2021 Imaging   CT chest abdomen pelvis Decreased cecum mass, however increased size and size of metastatic lesions throughout  the liver.  Interval enlargement of vaginal cuff mass. No mets in chest.    01/21/2022 -  Chemotherapy   COLORECTAL FOLFOX  + Bevacizumab q14d    patient was seen by gynecology oncology Dr. Fransisca Connors.  Biopsy of the vaginal mass was not recommended.Patient's case was also discussed on multidisciplinary tumor board.  IR potentially can try a biopsy if patient is willing to.I had a discussion with patient's daughter over the phone prior to this visit.  We discussed about option of adding oxaliplatin to 5-FU/bevacizumab and continue treatment of colon cancer, repeat CT scan short-term and if progression, repeat biopsy versus proceeding with vaginal mass biopsy.  Daughter prefers proceeding with chemotherapy and defer biopsy.   01/08/2022 patient has had a second opinion at [facility] and was seen by Dr. Reynaldo Minium .  Dr. Reynaldo Minium agrees with the current treatment plan with FOLFOX/bevacizumab.  He has ordered guardant 360 to see if she may be eligible for clinical trial in the future.  -   04/13/2022 Imaging    CT chest abdomen pelvis showed a cecal mass with stable hepatic metastasis.  Mixed cystic and solid mass in the region of the vaginal cuff, new area of patchy groundglass and consolidation in the right upper lobe, lingula and left lower lobe.  Likely due to pneumonia.  Hepatic steatosis.  Bilateral renal stones.  Aortic atherosclerosis   06/24/2022 Imaging   CT abdomen pelvis with contrast showed There are low-density lesions in liver consistent with metastatic disease with no significant change. Wall thickening in the cecum appears less prominent. There is 5.5 x 4.2 cm mixed density lesion in vaginal cuff suggesting possible metastatic disease.   There is no evidence of intestinal obstruction or pneumoperitoneum.There is no hydronephrosis.   Small patchy infiltrates are seen in the lower lung fields suggesting atelectasis/pneumonia.   Small bilateral renal stones. Diverticulosis of colon without signs  of diverticulitis. 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 There is encroachment of neural foramina at L4-L5 and L5-S1 levels.     06/25/2022 Imaging   CT soft tissue neck with contrast showed1No acute process in the neck to explain the patient's difficulty swallowing.  CT chest contrast showed 1. Patchy areas of airspace opacity in the right upper lobe and left lower lobe and lingula slightly progressed since the prior radiograph and may represent worsening or recurrent pneumonia. Clinical correlation and follow-up to resolution recommended. 2. Aortic Atherosclerosis     06/24/2022 - 06/26/2022 patient was hospitalized due to difficulty swallowing, 1  day of abdominal pain.  Patient had extensive work-up including EGD which showed erosive gastritis.  No significant esophageal or gastric abnormality.  CT scan showed possible aspiration induced pneumonitis.  Antibiotics was held.  Today patient denies any fever, chills,.  Occasionally she has cough.   INTERVAL HISTORY Chelsea [name] is a 76 y.o. female who has above history reviewed by me today presents for follow up visit for management of metastatic colon cancer Patient was accompanied by care giver.  + fatigue.  + for a few days after last chemotherapy, she reports loose BM x 1 time, every other day.  She did not need to take antidiarrhea medication as diarrhea spontaneously resolves.  No nausea vomiting diarrhea today.    Review of Systems  Constitutional:  Positive for fatigue. Negative for appetite change, chills, fever and unexpected weight change.  HENT:   Negative for hearing loss and voice change.   Eyes:  Negative for eye problems.  Respiratory:  Negative for chest tightness and cough.   Cardiovascular:  Negative for chest pain.  Gastrointestinal:  Negative for abdominal distention, abdominal pain, blood in stool and diarrhea.  Endocrine: Negative for hot flashes.  Genitourinary:  Negative for  difficulty urinating and frequency.   Musculoskeletal:  Positive for back pain. Negative for arthralgias.  Skin:  Negative for itching and rash.  Neurological:  Positive for numbness (chornic finger tips). Negative for extremity weakness.  Hematological:  Negative for adenopathy.  Psychiatric/Behavioral:  Negative for confusion.     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)    Moderate mitral insufficiency    Port-A-Cath in place    Seizures [facility])    Spinal stenosis    Ulcer    Urinary incontinence     SURGICAL HISTORY: Past Surgical History:  Procedure Laterality Date   ABDOMINAL HYSTERECTOMY     BACK SURGERY     due to polio   BREAST BIOPSY Bilateral    neg   BREAST BIOPSY Right 2011   neg/stereo   CARPAL TUNNEL RELEASE     CATARACT EXTRACTION Right 2020   COLONOSCOPY WITH PROPOFOL N/A 04/04/2021   Procedure: COLONOSCOPY WITH PROPOFOL;  Surgeon: Virgel Manifold, MD;  Location: [facility] ENDOSCOPY;  Service: Endoscopy;  Laterality: N/A;   ESOPHAGOGASTRODUODENOSCOPY (EGD) WITH PROPOFOL N/A 06/25/2022   Procedure: ESOPHAGOGASTRODUODENOSCOPY (EGD) WITH PROPOFOL;  Surgeon: Lin Landsman, MD;  [facility];  Service: Gastroenterology;  Laterality: N/A;   EYE SURGERY     IR IMAGING GUIDED PORT INSERTION  10/09/2021    SOCIAL HISTORY: Social History   Socioeconomic History   Marital status: Divorced    Spouse name: Not on file   Number of children: Not on file   Years of education: Not on file   Highest education level: Not on file  Occupational History   Not on file  Tobacco Use   Smoking status: Never   Smokeless tobacco: Never  Vaping Use   Vaping Use: Never used  Substance and Sexual Activity   Alcohol use: Never   Drug use: No   Sexual activity: Not Currently  Other Topics Concern   Not on file   Social History Narrative   Not on file   Social Determinants of Health   Financial Resource Strain: Low Risk  (  01/12/2022)   Overall Financial Resource Strain (CARDIA)    Difficulty of Paying Living Expenses: Not very hard  Food Insecurity: No Food Insecurity (06/24/2022)   Hunger Vital Sign    Worried About Running Out of Food in the Last Year: Never true    Ran Out of Food in the Last Year: Never true  Transportation Needs: No Transportation Needs (06/24/2022)   PRAPARE - Hydrologist (Medical): No    Lack of Transportation (Non-Medical): No  Physical Activity: Inactive (01/12/2022)   Exercise Vital Sign    Days of Exercise per Week: 0 days    Minutes of Exercise per Session: 0 min  Stress: Stress Concern Present (01/12/2022)   [place]    Feeling of Stress : To some extent  Social Connections: Moderately Integrated (01/12/2022)   Social Connection and Isolation Panel [NHANES]    Frequency of Communication with Friends and Family: Three times a week    Frequency of Social Gatherings with Friends and Family: Three times a week    Attends Religious Services: 1 to 4 times per year    Active Member of Clubs or Organizations: No    Attends Archivist Meetings: 1 to 4 times per year    Marital Status: Widowed  Intimate Partner Violence: Not At Risk (06/24/2022)   Humiliation, Afraid, Rape, and Kick questionnaire    Fear of Current or Ex-Partner: No    Emotionally Abused: No    Physically Abused: No    Sexually Abused: No    FAMILY HISTORY: Family History  Problem Relation Age of Onset   Arthritis Mother    Heart disease Mother    Stroke Mother    Hypertension Mother    COPD Mother    Sudden death Sister    Other Father        unknown medical history   Breast cancer Neg Hx     ALLERGIES:  is allergic to penicillins.  MEDICATIONS:  Current Outpatient Medications   Medication Sig Dispense Refill   acetaminophen (TYLENOL) 500 MG tablet Take 500 mg by mouth every 6 (six) hours as needed.     ALPRAZolam (XANAX) 0.5 MG tablet TAKE 1 TABLET BY MOUTH TWICE A DAY AS NEEDED FOR ANXIETY 60 tablet 0   atorvastatin (LIPITOR) 10 MG tablet TAKE 1 TABLET BY MOUTH EVERY DAY 90 tablet 0   busPIRone (BUSPAR) 5 MG tablet Take 1 tablet (5 mg total) by mouth 3 (three) times daily. 270 tablet 1   Cyanocobalamin (VITAMIN B 12 PO) Take 500 mcg by mouth daily.     diclofenac Sodium (VOLTAREN) 1 % GEL Apply 4 g topically 4 (four) times daily. 4 g 0   docusate sodium (COLACE) 100 MG capsule Take 1 capsule (100 mg total) by mouth 2 (two) times daily. 60 capsule 0   FLUoxetine (PROZAC) 20 MG capsule TAKE 3 CAPSULES (60 MG TOTAL) BY MOUTH EVERY MORNING. 270 capsule 0   gabapentin (NEURONTIN) 100 MG capsule Take 2 capsules (200 mg total) by mouth 3 (three) times daily. 180 capsule 1   lidocaine-prilocaine (EMLA) cream Apply small amount of cream to port site 1-2 hour prior to chemo treatment. 30 g 3   loperamide (IMODIUM) 2 MG capsule TAKE 1 CAP BY MOUTH SEE ADMIN INSTRUCTIONS. INITIAL: 4 MG, FOLLOWED BY 2 MG AFTER EACH LOOSE STOOL MAXIMUM: 16 MG/DAY 60 capsule 0   loratadine (CLARITIN) 10 MG tablet  Take 1 tablet (10 mg total) by mouth See admin instructions. Take 1 tablet daily for 4 days after each chemotherapy treatments. 90 tablet 0   meclizine (ANTIVERT) 12.5 MG tablet Take 1 tablet (12.5 mg total) by mouth 3 (three) times daily as needed for dizziness. 30 tablet 0   methocarbamol (ROBAXIN) 500 MG tablet TAKE 1 TABLET TWICE DAILY AS NEEDED FOR MUSCLE SPASM(S) 180 tablet 0   ondansetron (ZOFRAN) 8 MG tablet Take 1 tablet (8 mg total) by mouth 2 (two) times daily as needed (Nausea or vomiting). 30 tablet 1   polyethylene glycol powder (GLYCOLAX/MIRALAX) 17 GM/SCOOP powder Take 17 g by mouth daily. 3350 g 1   potassium chloride SA (KLOR-CON M) 20 MEQ tablet Take 2 tablets (40 mEq  total) by mouth daily. 90 tablet 0   triamterene-hydrochlorothiazide (MAXZIDE-25) 37.5-25 MG tablet TAKE 1 TABLET EVERY DAY 90 tablet 0   sucralfate (CARAFATE) 1 GM/10ML suspension Take 10 mLs (1 g total) by mouth 4 (four) times daily -  with meals and at bedtime. (Patient not taking: Reported on 07/29/2022) 420 mL 0   No current facility-administered medications for this visit.   Facility-Administered Medications Ordered in Other Visits  Medication Dose Route Frequency Provider Last Rate Last Admin   heparin lock flush 100 UNIT/ML injection            palonosetron (ALOXI) 0.25 MG/5ML injection            prochlorperazine (COMPAZINE) 10 MG tablet              PHYSICAL EXAMINATION: ECOG PERFORMANCE STATUS: 2 - Symptomatic, <50% confined to bed Vitals:   07/29/22 0837  BP: 130/80  Pulse: 81  Temp: (!) 96.6 F (35.9 C)  SpO2: 100%    Filed Weights   07/29/22 0837  Weight: 147 lb (66.7 kg)      Physical Exam Constitutional:      General: She is not in acute distress.    Comments: Patient sits in wheelchair  HENT:     Head: Normocephalic and atraumatic.  Eyes:     General: No scleral icterus. Cardiovascular:     Rate and Rhythm: Normal rate and regular rhythm.     Heart sounds: Normal heart sounds.  Pulmonary:     Effort: Pulmonary effort is normal. No respiratory distress.     Breath sounds: No wheezing.  Abdominal:     General: Bowel sounds are normal. There is no distension.     Palpations: Abdomen is soft.  Musculoskeletal:        General: No deformity. Normal range of motion.     Cervical back: Normal range of motion and neck supple.  Skin:    General: Skin is warm and dry.     Findings: No erythema or rash.  Neurological:     Mental Status: She is alert and oriented to person, place, and time. Mental status is at baseline.     Cranial Nerves: No cranial nerve deficit.     Coordination: Coordination normal.  Psychiatric:        Mood and Affect: Mood normal.      LABORATORY DATA:  I have reviewed the data as listed     Latest Ref Rng & Units 07/29/2022    8:21 AM 07/15/2022    9:07 AM 07/01/2022    9:11 AM  CBC  WBC 4.0 - 10.5 K/uL 8.3  3.7  4.4   Hemoglobin 12.0 - 15.0 g/dL 10.5  10.4  10.4   Hematocrit 36.0 - 46.0 % 33.2  32.4  32.5   Platelets 150 - 400 K/uL 147  205  114       Latest Ref Rng & Units 07/29/2022    8:21 AM 07/15/2022    9:07 AM 07/01/2022    9:11 AM  CMP  Glucose 70 - 99 mg/dL 106  122  102   BUN 8 - 23 mg/dL 5  12  10    Creatinine 0.44 - 1.00 mg/dL 0.79  0.77  0.95   Sodium 135 - 145 mmol/L 139  139  138   Potassium 3.5 - 5.1 mmol/L 3.4  3.4  2.7   Chloride 98 - 111 mmol/L 105  105  102   CO2 22 - 32 mmol/L 27  29  28    Calcium 8.9 - 10.3 mg/dL 9.2  9.3  8.9   Total Protein 6.5 - 8.1 g/dL 7.3  7.2  7.1   Total Bilirubin 0.3 - 1.2 mg/dL 0.4  0.7  0.5   Alkaline Phos 38 - 126 U/L 90  59  84   AST 15 - 41 U/L 25  26  32   ALT 0 - 44 U/L 10  14  18       RADIOGRAPHIC STUDIES: I have personally reviewed the radiological images as listed and agreed with the findings in the report. DG Bone Density  Result Date: 07/13/2022 EXAM: DUAL X-RAY ABSORPTIOMETRY (DXA) FOR BONE MINERAL DENSITY IMPRESSION: Your patient Chelsea [name] completed a BMD test on 07/09/2022 using the Aurora (software version: 14.10) manufactured by [employer]. The following summarizes the results of our evaluation. Technologist:VLM PATIENT BIOGRAPHICAL: Name: Gregg, Holster Patient ID: [id number] Birth Date: 04/16/1946 Height: 57.0 in. Gender: Female Exam Date: 07/09/2022 Weight: 148.0 lbs. Indications: History of kidney disease, Hysterectomy, Postmenopausal Fractures: Treatments: Protonix DENSITOMETRY RESULTS: Site         Region     Measured Date Measured Age WHO Classification Young Adult T-score BMD         %Change vs. Previous Significant Change (*) AP Spine L2-L4 07/09/2021 77.1 Normal 3.8 1.683 g/cm2 - -  DualFemur Neck Left 07[age]09/2021 77.1 Normal 0.8 1.144 g/cm2 - - Left Forearm Radius 33% 07[age]09/2021 77.1 Normal 1.6 1.020 g/cm2 - - ASSESSMENT: The BMD measured at Femur Neck Left is 1.144 g/cm2 with a T-score of 0.8. This patient is considered normal according to [facility]) criteria. L-1 was excluded due to degenerative changes. The scan quality is good. World Pharmacologist [facility]) criteria for post-menopausal, Caucasian Women: Normal:                   T-score at or above -1 SD Osteopenia/low bone mass: T-score between -1 and -2.5 SD Osteoporosis:             T-score at or below -2.5 SD RECOMMENDATIONS: 1. All patients should optimize calcium and vitamin D intake. 2. Consider FDA-approved medical therapies in postmenopausal women and men aged 77 years and older, based on the following: a. A hip or vertebral(clinical or morphometric) fracture b. T-score < -2.5 at the femoral neck or spine after appropriate evaluation to exclude secondary causes c. Low bone mass (T-score between -1.0 and -2.5 at the femoral neck or spine) and a 10-year probability of a hip fracture > 3% or a 10-year probability of a major osteoporosis-related fracture > 20% based on the [place]-adapted WHO algorithm 3. Clinician judgment and/or  patient preferences may indicate treatment for people with 10-year fracture probabilities above or below these levels FOLLOW-UP: People with diagnosed cases of osteoporosis or at high risk for fracture should have regular bone mineral density tests. For patients eligible for Medicare, routine testing is allowed once every 2 years. The testing frequency can be increased to one year for patients who have rapidly progressing disease, those who are receiving or discontinuing medical therapy to restore bone mass, or have additional risk factors. I have reviewed this report, and agree with the above findings. [facility] Electronically Signed   By: Zerita Boers M.D.   On:  07/13/2022 07:57   MM 3D SCREEN BREAST BILATERAL  Result Date: 07/10/2022 CLINICAL DATA:  Screening. EXAM: DIGITAL SCREENING BILATERAL MAMMOGRAM WITH TOMOSYNTHESIS AND CAD TECHNIQUE: Bilateral screening digital craniocaudal and mediolateral oblique mammograms were obtained. Bilateral screening digital breast tomosynthesis was performed. The images were evaluated with computer-aided detection. COMPARISON:  Previous exam(s). ACR Breast Density Category b: There are scattered areas of fibroglandular density. FINDINGS: In the left breast, a possible mass warrants further evaluation. In the right breast, no findings suspicious for malignancy. IMPRESSION: Further evaluation is suggested for a possible mass in the left breast. RECOMMENDATION: Diagnostic mammogram and possibly ultrasound of the left breast. (Code:[id number]) The patient will be contacted regarding the findings, and additional imaging will be scheduled. BI-RADS CATEGORY  0: Incomplete. Need additional imaging evaluation and/or prior mammograms for comparison. Electronically Signed   By: Kristopher Oppenheim M.D.   On: 07/10/2022 13:15   CT CHEST W CONTRAST  Result Date: 06/25/2022 CLINICAL DATA:  Aspiration. EXAM: CT CHEST WITH CONTRAST TECHNIQUE: Multidetector CT imaging of the chest was performed during intravenous contrast administration. RADIATION DOSE REDUCTION: This exam was performed according to the departmental dose-optimization program which includes automated exposure control, adjustment of the mA and/or kV according to patient size and/or use of iterative reconstruction technique. CONTRAST:  12m OMNIPAQUE IOHEXOL 300 MG/ML  SOLN COMPARISON:  CT of the chest abdomen pelvis dated 04/13/2022. FINDINGS: Cardiovascular: There is no cardiomegaly or pericardial effusion. Mild atherosclerotic calcification of the thoracic aorta. No aneurysmal dilatation or dissection. The origins of the great vessels of the aortic arch and the central pulmonary arteries  are patent. Left-sided Port-A-Cath with tip in the right atrium close to the cavoatrial junction. Mediastinum/Nodes: No hilar or mediastinal adenopathy. Calcified granuloma. The esophagus is grossly unremarkable. No mediastinal fluid collection. Lungs/Pleura: Patchy areas of airspace opacity in the right upper lobe, progressed since the prior radiograph may represent worsening or recurrent pneumonia. Clinical correlation and follow-up to resolution recommended. Partially calcified right middle lobe nodule, likely granuloma. Patchy areas of ground-glass density involving the left lower lobe and lingula similar or slightly progressed since the prior radiograph and may represent worsening infiltrate. Aspiration is not excluded. No pleural effusion or pneumothorax. The central airways are patent. Upper Abdomen: Probable gallbladder sludge or small stones. Fatty liver with a possible changes of cirrhosis. Small bilateral renal calculi. Areas of cortical irregularity and scarring of the kidneys. Musculoskeletal: Osteopenia with degenerative changes of the spine. 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 No acute osseous pathology. IMPRESSION: 1. Patchy areas of airspace opacity in the right upper lobe and left lower lobe and lingula slightly progressed since the prior radiograph and may represent worsening or recurrent pneumonia. Clinical correlation and follow-up to resolution recommended. 2. Aortic Atherosclerosis (ICD10-I70.0). Electronically Signed   By: AAnner CreteM.D.   On: 06/25/2022  18:06   CT SOFT TISSUE NECK W CONTRAST  Result Date: 06/25/2022 CLINICAL DATA:  Swallowing difficulties, palate weakness (CN 9) EXAM: CT NECK WITH CONTRAST TECHNIQUE: Multidetector CT imaging of the neck was performed using the standard protocol following the bolus administration of intravenous contrast. RADIATION DOSE REDUCTION: This exam was performed according to the  departmental dose-optimization program which includes automated exposure control, adjustment of the mA and/or kV according to patient size and/or use of iterative reconstruction technique. CONTRAST:  39m OMNIPAQUE IOHEXOL 300 MG/ML  SOLN COMPARISON:  None Available. FINDINGS: Pharynx and larynx: Normal. No mass or swelling. Salivary glands: No inflammation, mass, or stone. Thyroid: Normal. Lymph nodes: None enlarged or abnormal density. Vascular: Patent. Limited intracranial: Negative. Visualized orbits: Negative. Mastoids and visualized paranasal sinuses: Clear. Skeleton: No acute osseous abnormality. Posterior fusion and decompression C3-C6. Upper chest: Please see same-day CT chest. Other: None. IMPRESSION: 1. No acute process in the neck to explain the patient's difficulty swallowing. 2.  For findings in the thorax, please see same day CT chest. Electronically Signed   By: AMerilyn BabaM.D.   On: 06/25/2022 18:02   CT ABDOMEN PELVIS W CONTRAST  Result Date: 06/24/2022 CLINICAL DATA:  Pain right lower quadrant, pelvic pain, vomiting, metastatic colon carcinoma EXAM: CT ABDOMEN AND PELVIS WITH CONTRAST TECHNIQUE: Multidetector CT imaging of the abdomen and pelvis was performed using the standard protocol following bolus administration of intravenous contrast. RADIATION DOSE REDUCTION: This exam was performed according to the departmental dose-optimization program which includes automated exposure control, adjustment of the mA and/or kV according to patient size and/or use of iterative reconstruction technique. CONTRAST:  1032mOMNIPAQUE IOHEXOL 300 MG/ML  SOLN COMPARISON:  Previous studies including the examination of 04/13/2022 FINDINGS: Lower chest: Small patchy infiltrates are seen in lower lung fields, more so in the left lower lobe. There is calcified granuloma in left lower lung field. Hepatobiliary: There are a few low-density lesions in liver largest measuring 2 cm with no significant interval  change. There is fatty infiltration in liver. Gallbladder is distended. There is no wall thickening. There is no dilation of bile ducts. Pancreas: No focal abnormalities are seen. Spleen: Unremarkable. Adrenals/Urinary Tract: Adrenals are unremarkable. There is no hydronephrosis. 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 There is 2 mm calculus in the right kidney. There is 2 mm calculus in the left kidney. Urinary bladder is distended. Diverticulum is noted in the left posterolateral wall of the bladder. Stomach/Bowel: Stomach is not distended. Small bowel loops are not dilated. Appendix is not seen. There is no pericecal inflammation. Wall thickening in cecum appears less prominent. In the current study, maximum wall thickness in the medial aspect of the cecum measures 12 mm. Few scattered diverticula are seen in colon without signs of focal acute diverticulitis. Vascular/Lymphatic: Scattered arterial calcifications are seen. Reproductive: Uterus is not seen. There is 5.5 x 4.2 cm inhomogeneous soft tissue mass and fluid density in vaginal cuff with no significant interval change. Small amount of fluid is seen in the vaginal canal. Other: There is no ascites or pneumoperitoneum. Small umbilical hernia containing fat is seen. Musculoskeletal: There is minimal anterolisthesis at L4-L5 level. There is severe spinal stenosis and encroachment of neural foramina at L4-L5 level. There is encroachment of neural foramina at L5-S1 level, more so on the right side. There is partial fusion of bodies of L5 and S1 vertebrae. IMPRESSION: There are low-density lesions in liver consistent with metastatic disease with no  significant change. Wall thickening in the cecum appears less prominent. There is 5.5 x 4.2 cm mixed density lesion in vaginal cuff suggesting possible metastatic disease. There is no evidence of intestinal obstruction or pneumoperitoneum. There is no hydronephrosis. Small  patchy infiltrates are seen in the lower lung fields suggesting atelectasis/pneumonia. Small bilateral renal stones. Diverticulosis of colon without signs of diverticulitis. 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 There is encroachment of neural foramina at L4-L5 and L5-S1 levels. Other findings as described in the body of the report. Electronically Signed   By: Elmer Picker M.D.   On: 06/24/2022 14:27   DG Chest 2 View  Result Date: 06/24/2022 CLINICAL DATA:  Dysphagia. EXAM: CHEST - 2 VIEW COMPARISON:  Chest x-ray 06/17/2021. FINDINGS: Similar mild enlargement the cardiac silhouette. Left IJ approach Port-A-Cath with the tip projecting at the superior right atrium. No consolidation. No visible pleural effusions or pneumothorax. Partially imaged ACDF. Polyarticular degenerative change. 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 IMPRESSION: No evidence of acute cardiopulmonary disease. Electronically Signed   By: Margaretha Sheffield M.D.   On: 06/24/2022 12:18

## 2022-07-29 NOTE — Assessment & Plan Note (Addendum)
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  No sufficient tissue for NGS Liquid biopsy showed KRAS12V, APC, TP53 mutation, TMB 4.  S/p 6 cycles of 5-FU /Bevacizumab--> CT showed mixed response--> FOLFOX/bevacizumab.-->CT imaging shows stable disease.  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  Stable disease.

## 2022-07-29 NOTE — Assessment & Plan Note (Signed)
Continue potassium chloride 61mq daily.

## 2022-07-29 NOTE — Assessment & Plan Note (Addendum)
Hemoglobin is stable.   

## 2022-07-29 NOTE — Assessment & Plan Note (Signed)
Chemotherapy plan as listed above 

## 2022-07-29 NOTE — Progress Notes (Signed)
Nutrition  Called patient for nutrition follow-up.  No answer.  Left message with call back number.  Kenny Rea B. Aubreanna Percle, RD, LDN Registered Dietitian [phone]  

## 2022-07-29 NOTE — Assessment & Plan Note (Signed)
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  Bilateral fingertips, right worse than left gabapentin to 200 mg 3 times daily.

## 2022-07-30 LAB — CEA: CEA: 3.6 ng/mL (ref 0.0–4.7)

## 2022-07-31 ENCOUNTER — Ambulatory Visit
Admission: RE | Admit: 2022-07-31 | Discharge: 2022-07-31 | Disposition: A | Discharge status: Home / Self Care | Payer: Medicare HMO | Source: Ambulatory Visit | Attending: Internal Medicine | Admitting: Internal Medicine

## 2022-07-31 DIAGNOSIS — N63 Unspecified lump in unspecified breast: Secondary | ICD-10-CM | POA: Insufficient documentation

## 2022-07-31 DIAGNOSIS — R928 Other abnormal and inconclusive findings on diagnostic imaging of breast: Secondary | ICD-10-CM | POA: Diagnosis not present

## 2022-07-31 DIAGNOSIS — R921 Mammographic calcification found on diagnostic imaging of breast: Secondary | ICD-10-CM | POA: Diagnosis not present

## 2022-07-31 DIAGNOSIS — N6321 Unspecified lump in the left breast, upper outer quadrant: Secondary | ICD-10-CM | POA: Diagnosis not present

## 2022-08-10 ENCOUNTER — Encounter: Payer: Self-pay | Admitting: Oncology

## 2022-08-10 ENCOUNTER — Inpatient Hospital Stay: Payer: Medicare HMO

## 2022-08-10 ENCOUNTER — Inpatient Hospital Stay: Payer: Medicare HMO | Attending: Oncology

## 2022-08-10 ENCOUNTER — Inpatient Hospital Stay (HOSPITAL_BASED_OUTPATIENT_CLINIC_OR_DEPARTMENT_OTHER): Payer: Medicare HMO | Admitting: Oncology

## 2022-08-10 VITALS — BP 122/74 | HR 68 | Temp 97.1°F | Resp 18 | Wt 149.9 lb

## 2022-08-10 DIAGNOSIS — T451X5A Adverse effect of antineoplastic and immunosuppressive drugs, initial encounter: Secondary | ICD-10-CM

## 2022-08-10 DIAGNOSIS — Z5111 Encounter for antineoplastic chemotherapy: Secondary | ICD-10-CM | POA: Diagnosis not present

## 2022-08-10 DIAGNOSIS — C189 Malignant neoplasm of colon, unspecified: Secondary | ICD-10-CM | POA: Diagnosis not present

## 2022-08-10 DIAGNOSIS — Z79899 Other long term (current) drug therapy: Secondary | ICD-10-CM | POA: Diagnosis not present

## 2022-08-10 DIAGNOSIS — C787 Secondary malignant neoplasm of liver and intrahepatic bile duct: Secondary | ICD-10-CM | POA: Insufficient documentation

## 2022-08-10 DIAGNOSIS — D6481 Anemia due to antineoplastic chemotherapy: Secondary | ICD-10-CM | POA: Diagnosis not present

## 2022-08-10 DIAGNOSIS — Z5112 Encounter for antineoplastic immunotherapy: Secondary | ICD-10-CM | POA: Insufficient documentation

## 2022-08-10 DIAGNOSIS — G629 Polyneuropathy, unspecified: Secondary | ICD-10-CM | POA: Diagnosis not present

## 2022-08-10 DIAGNOSIS — C180 Malignant neoplasm of cecum: Secondary | ICD-10-CM | POA: Diagnosis not present

## 2022-08-10 LAB — CBC WITH DIFFERENTIAL/PLATELET
Abs Immature Granulocytes: 0.01 10*3/uL (ref 0.00–0.07)
Basophils Absolute: 0 10*3/uL (ref 0.0–0.1)
Basophils Relative: 1 %
Eosinophils Absolute: 0 10*3/uL (ref 0.0–0.5)
Eosinophils Relative: 1 %
HCT: 32.7 % — ABNORMAL LOW (ref 36.0–46.0)
Hemoglobin: 10.3 g/dL — ABNORMAL LOW (ref 12.0–15.0)
Immature Granulocytes: 0 %
Lymphocytes Relative: 50 %
Lymphs Abs: 1.8 10*3/uL (ref 0.7–4.0)
MCH: 32 pg (ref 26.0–34.0)
MCHC: 31.5 g/dL (ref 30.0–36.0)
MCV: 101.6 fL — ABNORMAL HIGH (ref 80.0–100.0)
Monocytes Absolute: 0.5 10*3/uL (ref 0.1–1.0)
Monocytes Relative: 14 %
Neutro Abs: 1.2 10*3/uL — ABNORMAL LOW (ref 1.7–7.7)
Neutrophils Relative %: 34 %
Platelets: 158 10*3/uL (ref 150–400)
RBC: 3.22 MIL/uL — ABNORMAL LOW (ref 3.87–5.11)
RDW: 18.7 % — ABNORMAL HIGH (ref 11.5–15.5)
WBC: 3.6 10*3/uL — ABNORMAL LOW (ref 4.0–10.5)
nRBC: 0 % (ref 0.0–0.2)

## 2022-08-10 LAB — COMPREHENSIVE METABOLIC PANEL
ALT: 11 U/L (ref 0–44)
AST: 19 U/L (ref 15–41)
Albumin: 3.2 g/dL — ABNORMAL LOW (ref 3.5–5.0)
Alkaline Phosphatase: 60 U/L (ref 38–126)
Anion gap: 5 (ref 5–15)
BUN: 14 mg/dL (ref 8–23)
CO2: 30 mmol/L (ref 22–32)
Calcium: 9.2 mg/dL (ref 8.9–10.3)
Chloride: 106 mmol/L (ref 98–111)
Creatinine, Ser: 0.7 mg/dL (ref 0.44–1.00)
GFR, Estimated: >60 mL/min (ref >60)
Glucose, Bld: 104 mg/dL — ABNORMAL HIGH (ref 70–99)
Potassium: 3.3 mmol/L — ABNORMAL LOW (ref 3.5–5.1)
Sodium: 141 mmol/L (ref 135–145)
Total Bilirubin: 0.6 mg/dL (ref 0.3–1.2)
Total Protein: 6.9 g/dL (ref 6.5–8.1)

## 2022-08-10 LAB — PROTEIN, URINE, RANDOM: Total Protein, Urine: 10 mg/dL

## 2022-08-10 MED ORDER — OXALIPLATIN CHEMO INJECTION 100 MG/20ML
115.0000 mg | Freq: Once | INTRAVENOUS | Status: AC
  Administered 2022-08-10: 115 mg via INTRAVENOUS
  Filled 2022-08-10: qty 23

## 2022-08-10 MED ORDER — FLUOROURACIL CHEMO INJECTION 2.5 GM/50ML
400.0000 mg/m2 | Freq: Once | INTRAVENOUS | Status: AC
  Administered 2022-08-10: 650 mg via INTRAVENOUS
  Filled 2022-08-10: qty 13

## 2022-08-10 MED ORDER — LEUCOVORIN CALCIUM INJECTION 350 MG
700.0000 mg | Freq: Once | INTRAVENOUS | Status: AC
  Administered 2022-08-10: 700 mg via INTRAVENOUS
  Filled 2022-08-10: qty 35

## 2022-08-10 MED ORDER — DEXTROSE 5 % IV SOLN
INTRAVENOUS | Status: DC
  Filled 2022-08-10: qty 250

## 2022-08-10 MED ORDER — POTASSIUM CHLORIDE CRYS ER 20 MEQ PO TBCR: 40.0000 meq | EXTENDED_RELEASE_TABLET | Freq: Every day | ORAL | 0 refills | Status: DC

## 2022-08-10 MED ORDER — PALONOSETRON HCL INJECTION 0.25 MG/5ML
0.2500 mg | Freq: Once | INTRAVENOUS | Status: AC
  Administered 2022-08-10: 0.25 mg via INTRAVENOUS
  Filled 2022-08-10: qty 5

## 2022-08-10 MED ORDER — SODIUM CHLORIDE 0.9 % IV SOLN
5.0000 mg/kg | Freq: Once | INTRAVENOUS | Status: AC
  Administered 2022-08-10: 350 mg via INTRAVENOUS
  Filled 2022-08-10: qty 14

## 2022-08-10 MED ORDER — SODIUM CHLORIDE 0.9 % IV SOLN
2400.0000 mg/m2 | INTRAVENOUS | Status: DC
  Administered 2022-08-10: 3950 mg via INTRAVENOUS
  Filled 2022-08-10: qty 79

## 2022-08-10 MED ORDER — SODIUM CHLORIDE 0.9 % IV SOLN
Freq: Once | INTRAVENOUS | Status: AC
  Filled 2022-08-10: qty 250

## 2022-08-10 MED ORDER — PROCHLORPERAZINE MALEATE 10 MG PO TABS
10.0000 mg | ORAL_TABLET | Freq: Once | ORAL | Status: AC
  Administered 2022-08-10: 10 mg via ORAL
  Filled 2022-08-10: qty 1

## 2022-08-10 MED ORDER — LIDOCAINE-PRILOCAINE 2.5-2.5 % EX CREA: TOPICAL_CREAM | CUTANEOUS | 6 refills | Status: DC

## 2022-08-10 NOTE — Progress Notes (Signed)
Hematology/Oncology Progress note [phone] [phone]         Patient Care Team: Jearld Fenton, NP as PCP - General (Internal Medicine) Clent Jacks, RN as Oncology Nurse Navigator [name], MD as Consulting Physician (Oncology)  ASSESSMENT & PLAN:   Cancer Staging  Metastatic colon cancer to liver [facility] Staging form: Colon and Rectum, AJCC 8th Edition - Clinical stage from 09/04/2021: Stage Unknown (cTX, cNX, cM1) - Signed by [name], MD on 05/13/2022   Metastatic colon cancer to liver [facility]) #Metastatic colon cancer to liver, 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  No sufficient tissue for NGS Liquid biopsy showed KRAS12V, APC, TP53 mutation, TMB 4.  S/p 6 cycles of 5-FU /Bevacizumab--> CT showed mixed response--> FOLFOX/bevacizumab.-->CT imaging shows stable disease.  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  Stable disease.  Anemia due to antineoplastic chemotherapy Hemoglobin is stable.  Continue monitor.    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  Bilateral fingertips, pre-existing right side neuropathy is slightly worse. Left side is intermittent.  gabapentin to 200 mg 3 times daily.  Oxaliplatin has been dose reduced to 42m/m2. Consider switching to Irinotecan if neuropathy further exacerbated. Dicussed with patient and daughter.    No orders of the defined types were placed in this encounter.  I spent time and answered all patient's questions regarding why resuming chemotherapy is recommended.  All questions were answered to her satisfaction.   Follow-up in  2 weels  lab [name]. All questions were answered. The patient knows to call the clinic with any  problems, questions or concerns.  [name], PhD [facility] Oncology 08/10/2022   CHIEF COMPLAINTS/REASON FOR VISIT:  metastatic colon cancer  HISTORY OF PRESENTING ILLNESS:   Chelsea MCLOUGHLINis a  76y.o.  female presents for management of metastatic colon cancer. Oncology History  [place]  08/21/2021 Imaging   CT abdomen pelvis with contrast  showed soft tissue mass at the base of cecum measuring up to 5 cm, slightly increased in size.  Interval development of multiple low-density lesions throughout the liver highly suggestive for metastatic disease.  Complex mass in the region of the vaginal cuff measuring up to 5.2 cm, increased in size.  Right ovary also appears more prominent on the current examination.  Moderate large volume of stool throughout the colon.  Mild urinary bladder wall thickening.  Colonic diverticulosis without evidence of active diverticulitis.  Aortic atherosclerosis   09/04/2021 Procedure   patient is status post left lobe liver lesion biopsy and pathology showed metastatic adenocarcinoma, compatible with colorectal primary.   09/04/2021 Cancer Staging   Staging form: Colon and Rectum, AJCC 8th Edition - Clinical stage from 09/04/2021: Stage Unknown (cTX, cNX, cM1) - Signed by YEarlie Server MD on 05/13/2022 Stage prefix: Initial diagnosis   09/16/2021 Initial Diagnosis   Metastatic colon cancer to liver ([facility] one liquid biopsy testing showed KRASG12V, APC TP53 TMB 4  Patient initially went to emergency room on 03/24/2021 for abdominal pain. 03/24/2021, CT scan of the abdomen showed marked bladder wall thickening with surrounding soft tissue stranding is noted.  Concerning for cystitis.  There is a lobulated mass between the posterior wall of the bladder and the rectum which appears  to arise from the vaginal cuff.  This is indeterminate and difficult to characterize reflecting lack of IV contrast material.   Nonobstructing left renal calculus, left sacroiliitis, lumbar spondylosis aortic atherosclerosis. 03/24/2021 CT pelvic showed abdominal [place] soft tissue of right cecum, concerning for colon carcinoma.  Colonoscopy recommended.  Low-attenuation mass in the region of vaginal cuff is again noted.  Diffuse urinary bladder wall thickening and mild bilateral hydroureter possibly cystitis.   [place] 03/24/2021 Lobulated solid 3.8 cm mass confirmed by ultrasound. This is most compatible with a neoplasm but remains indeterminate as to the origin as the epicenter seems to be outside of both the vaginal cuff and the adjacent colon, while the lesion appears inseparable from both. Extensive echogenic debris within the distended urinary bladder.   03/26/2021, patient was seen by Dr. Theora Gianotti for evaluation of colonic/vaginal cuff pelvic mass.  Patient also was referred to gastroenterology for colonoscopy.   03/26/2021 CEA 3.4.  CA125 9.7 04/04/2021, status post colonoscopy which showed a frond like /villous nonobstructing large mass was found in the cecum, this was biopsied.  Terminal ileum was briefly intubated and appeared normal.  Diverticulosis in the sigmoid colon.  The rectum, sigmoid colon, descending colon, transverse colon and ascending colon were normal.  Nonbleeding internal hemorrhoids. 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 Given that the biopsy may not be representative of the entire underlying lesion.  Patient was recommended to establish with surgeon for evaluation. Patient no showed for her surgery appointment and as well as her follow-up appointment with gastroenterology.  Per daughter, patient was having cervical spine surgery and she prioritized that over the colon surgery.   10/20/2021 - 01/07/2022 Chemotherapy    COLORECTAL 5-FU  + Bevacizumab q14d      10/20/2021 -  Chemotherapy   Patient is on Treatment Plan : COLORECTAL 5FU + Leucovorin (Modified [place]) +  Bevacizumab q14d     12/22/2021 Imaging   CT chest abdomen pelvis Decreased cecum mass, however increased size and size of metastatic lesions throughout the liver.  Interval enlargement of vaginal cuff mass. No mets in chest.    01/21/2022 -  Chemotherapy   COLORECTAL FOLFOX  + Bevacizumab q14d    patient was seen by gynecology oncology Dr. Fransisca Connors.  Biopsy of the vaginal mass was not recommended.Patient's case was also discussed on multidisciplinary tumor board.  IR potentially can try a biopsy if patient is willing to.I had a discussion with patient's daughter over the phone prior to this visit.  We discussed about option of adding oxaliplatin to 5-FU/bevacizumab and continue treatment of colon cancer, repeat CT scan short-term and if progression, repeat biopsy versus proceeding with vaginal mass biopsy.  Daughter prefers proceeding with chemotherapy and defer biopsy.   01/08/2022 patient has had a second opinion at [facility] and was seen by Dr. Reynaldo Minium .  Dr. Reynaldo Minium agrees with the current treatment plan with FOLFOX/bevacizumab.  He has ordered guardant 360 to see if she may be eligible for clinical trial in the future.  -   04/13/2022 Imaging    CT chest abdomen pelvis showed a cecal mass with stable hepatic metastasis.  Mixed cystic and solid mass in the region of the vaginal cuff, new area of patchy groundglass and consolidation in the right upper lobe, lingula and left lower lobe.  Likely due to pneumonia.  Hepatic steatosis.  Bilateral renal stones.  Aortic atherosclerosis   06/24/2022 Imaging   CT abdomen pelvis with contrast showed There are low-density lesions in  liver consistent with metastatic disease with no significant change. Wall thickening in the cecum appears less prominent. There is 5.5 x 4.2 cm mixed density lesion in vaginal cuff suggesting possible metastatic disease.   There is no evidence of intestinal obstruction or pneumoperitoneum.There is no hydronephrosis.   Small  patchy infiltrates are seen in the lower lung fields suggesting atelectasis/pneumonia.   Small bilateral renal stones. Diverticulosis of colon without signs of diverticulitis. 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 There is encroachment of neural foramina at L4-L5 and L5-S1 levels.     06/25/2022 Imaging   CT soft tissue neck with contrast showed1No acute process in the neck to explain the patient's difficulty swallowing.  CT chest contrast showed 1. Patchy areas of airspace opacity in the right upper lobe and left lower lobe and lingula slightly progressed since the prior radiograph and may represent worsening or recurrent pneumonia. Clinical correlation and follow-up to resolution recommended. 2. Aortic Atherosclerosis     06/24/2022 - 06/26/2022 patient was hospitalized due to difficulty swallowing, 1 day of abdominal pain.  Patient had extensive work-up including EGD which showed erosive gastritis.  No significant esophageal or gastric abnormality.  CT scan showed possible aspiration induced pneumonitis.  Antibiotics was held.  Today patient denies any fever, chills,.  Occasionally she has cough.   INTERVAL HISTORY BRYNLYN DADE is a 76 y.o. female who has above history reviewed by me today presents for follow up visit for management of metastatic colon cancer Patient was accompanied by daughter + fatigue, improved.  No nausea vomiting diarrhea today.  There is plan of possible relocation to [place] area next year.  Right side pre-existing neuropathy, worse. On gabapentin.  Left hand finger tip neuropathy intermittent.    Review of Systems  Constitutional:  Positive for fatigue. Negative for appetite change, chills, fever and unexpected weight change.  HENT:   Negative for hearing loss and voice change.   Eyes:  Negative for eye problems.  Respiratory:  Negative for chest tightness and cough.   Cardiovascular:  Negative for chest pain.   Gastrointestinal:  Negative for abdominal distention, abdominal pain, blood in stool and diarrhea.  Endocrine: Negative for hot flashes.  Genitourinary:  Negative for difficulty urinating and frequency.   Musculoskeletal:  Positive for back pain. Negative for arthralgias.  Skin:  Negative for itching and rash.  Neurological:  Positive for numbness (chornic finger tips). Negative for extremity weakness.  Hematological:  Negative for adenopathy.  Psychiatric/Behavioral:  Negative for confusion.     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)    Moderate mitral insufficiency    Port-A-Cath in place    Seizures [facility])    Spinal stenosis    Ulcer    Urinary incontinence     SURGICAL HISTORY: Past Surgical History:  Procedure Laterality Date   ABDOMINAL HYSTERECTOMY     BACK SURGERY     due to polio   BREAST BIOPSY Bilateral    neg   BREAST BIOPSY Right 2011   neg/stereo   CARPAL TUNNEL RELEASE     CATARACT EXTRACTION Right 2020   COLONOSCOPY WITH PROPOFOL N/A 04/04/2021   Procedure: COLONOSCOPY WITH PROPOFOL;  Surgeon: Virgel Manifold, MD;  Location: [facility] ENDOSCOPY;  Service: Endoscopy;  Laterality: N/A;  ESOPHAGOGASTRODUODENOSCOPY (EGD) WITH PROPOFOL N/A 06/25/2022   Procedure: ESOPHAGOGASTRODUODENOSCOPY (EGD) WITH PROPOFOL;  Surgeon: Lin Landsman, MD;  Location: [place];  Service: Gastroenterology;  Laterality: N/A;   EYE SURGERY     IR IMAGING GUIDED PORT INSERTION  10/09/2021    SOCIAL HISTORY: Social History   Socioeconomic History   Marital status: Divorced    Spouse name: Not on file   Number of children: Not on file   Years of education: Not on file   Highest education level: Not on file  Occupational History   Not on file  Tobacco Use   Smoking status: Never   Smokeless tobacco: Never  Vaping Use    Vaping Use: Never used  Substance and Sexual Activity   Alcohol use: Never   Drug use: No   Sexual activity: Not Currently  Other Topics Concern   Not on file  Social History Narrative   Not on file   Social Determinants of Health   Financial Resource Strain: Low Risk  (01/12/2022)   Overall Financial Resource Strain (CARDIA)    Difficulty of Paying Living Expenses: Not very hard  Food Insecurity: No Food Insecurity (06/24/2022)   Hunger Vital Sign    Worried About Running Out of Food in the Last Year: Never true    Ran Out of Food in the Last Year: Never true  Transportation Needs: No Transportation Needs (06/24/2022)   PRAPARE - Hydrologist (Medical): No    Lack of Transportation (Non-Medical): No  Physical Activity: Inactive (01/12/2022)   Exercise Vital Sign    Days of Exercise per Week: 0 days    Minutes of Exercise per Session: 0 min  Stress: Stress Concern Present (01/12/2022)   [place]    Feeling of Stress : To some extent  Social Connections: Moderately Integrated (01/12/2022)   Social Connection and Isolation Panel [NHANES]    Frequency of Communication with Friends and Family: Three times a week    Frequency of Social Gatherings with Friends and Family: Three times a week    Attends Religious Services: 1 to 4 times per year    Active Member of Clubs or Organizations: No    Attends Archivist Meetings: 1 to 4 times per year    Marital Status: Widowed  Intimate Partner Violence: Not At Risk (06/24/2022)   Humiliation, Afraid, Rape, and Kick questionnaire    Fear of Current or Ex-Partner: No    Emotionally Abused: No    Physically Abused: No    Sexually Abused: No    FAMILY HISTORY: Family History  Problem Relation Age of Onset   Arthritis Mother    Heart disease Mother    Stroke Mother    Hypertension Mother    COPD Mother    Sudden death Sister    Other  Father        unknown medical history   Breast cancer Neg Hx     ALLERGIES:  is allergic to penicillins.  MEDICATIONS:  Current Outpatient Medications  Medication Sig Dispense Refill   acetaminophen (TYLENOL) 500 MG tablet Take 500 mg by mouth every 6 (six) hours as needed.     ALPRAZolam (XANAX) 0.5 MG tablet TAKE 1 TABLET BY MOUTH TWICE A DAY AS NEEDED FOR ANXIETY 60 tablet 0   atorvastatin (LIPITOR) 10 MG tablet TAKE 1 TABLET BY MOUTH EVERY DAY 90 tablet 0   busPIRone (BUSPAR) 5 MG  tablet Take 1 tablet (5 mg total) by mouth 3 (three) times daily. 270 tablet 1   Cyanocobalamin (VITAMIN B 12 PO) Take 500 mcg by mouth daily.     diclofenac Sodium (VOLTAREN) 1 % GEL Apply 4 g topically 4 (four) times daily. 4 g 0   docusate sodium (COLACE) 100 MG capsule Take 1 capsule (100 mg total) by mouth 2 (two) times daily. 60 capsule 0   FLUoxetine (PROZAC) 20 MG capsule TAKE 3 CAPSULES (60 MG TOTAL) BY MOUTH EVERY MORNING. 270 capsule 0   gabapentin (NEURONTIN) 100 MG capsule Take 2 capsules (200 mg total) by mouth 3 (three) times daily. 180 capsule 1   loperamide (IMODIUM) 2 MG capsule TAKE 1 CAP BY MOUTH SEE ADMIN INSTRUCTIONS. INITIAL: 4 MG, FOLLOWED BY 2 MG AFTER EACH LOOSE STOOL MAXIMUM: 16 MG/DAY 60 capsule 0   loratadine (CLARITIN) 10 MG tablet Take 1 tablet (10 mg total) by mouth See admin instructions. Take 1 tablet daily for 4 days after each chemotherapy treatments. 90 tablet 0   meclizine (ANTIVERT) 12.5 MG tablet Take 1 tablet (12.5 mg total) by mouth 3 (three) times daily as needed for dizziness. 30 tablet 0   methocarbamol (ROBAXIN) 500 MG tablet TAKE 1 TABLET TWICE DAILY AS NEEDED FOR MUSCLE SPASM(S) 180 tablet 0   ondansetron (ZOFRAN) 8 MG tablet Take 1 tablet (8 mg total) by mouth 2 (two) times daily as needed (Nausea or vomiting). 30 tablet 1   polyethylene glycol powder (GLYCOLAX/MIRALAX) 17 GM/SCOOP powder Take 17 g by mouth daily. 3350 g 1   sucralfate (CARAFATE) 1 GM/10ML  suspension Take 10 mLs (1 g total) by mouth 4 (four) times daily -  with meals and at bedtime. 420 mL 0   triamterene-hydrochlorothiazide (MAXZIDE-25) 37.5-25 MG tablet TAKE 1 TABLET EVERY DAY 90 tablet 0   lidocaine-prilocaine (EMLA) cream Apply small amount of cream to port site 1-2 hour prior to chemo treatment. 30 g 6   potassium chloride SA (KLOR-CON M) 20 MEQ tablet Take 2 tablets (40 mEq total) by mouth daily. 90 tablet 0   No current facility-administered medications for this visit.   Facility-Administered Medications Ordered in Other Visits  Medication Dose Route Frequency Provider Last Rate Last Admin   dextrose 5 % solution   Intravenous Continuous [name], MD 20 mL/hr at 08/10/22 1211 New Bag at 08/10/22 1211   fluorouracil (ADRUCIL) 3,950 mg in sodium chloride 0.9 % 71 mL chemo infusion  2,400 mg/m2 (Order-Specific) Intravenous 1 day or 1 dose [name], MD       fluorouracil (ADRUCIL) chemo injection 650 mg  400 mg/m2 (Order-Specific) Intravenous Once [name], MD       heparin lock flush 100 UNIT/ML injection            leucovorin 700 mg in dextrose 5 % 250 mL infusion  700 mg Intravenous Once [name], MD 143 mL/hr at 08/10/22 1214 700 mg at 08/10/22 1214   oxaliplatin (ELOXATIN) 115 mg in dextrose 5 % 500 mL chemo infusion  115 mg Intravenous Once [name], MD 262 mL/hr at 08/10/22 1212 115 mg at 08/10/22 1212   palonosetron (ALOXI) 0.25 MG/5ML injection            prochlorperazine (COMPAZINE) 10 MG tablet              PHYSICAL EXAMINATION: ECOG PERFORMANCE STATUS: 2 - Symptomatic, <50% confined to bed Vitals:   08/10/22 0913  BP: 122/74  Pulse: 68  Resp: 18  Temp: (!) 97.1 F (36.2 C)  SpO2: 100%    Filed Weights   08/10/22 0913  Weight: 149 lb 14.4 oz (68 kg)      Physical Exam Constitutional:      General: She is not in acute distress.    Comments: Patient sits in wheelchair  HENT:     Head: Normocephalic and atraumatic.  Eyes:     General: No scleral  icterus. Cardiovascular:     Rate and Rhythm: Normal rate and regular rhythm.     Heart sounds: Normal heart sounds.  Pulmonary:     Effort: Pulmonary effort is normal. No respiratory distress.     Breath sounds: No wheezing.  Abdominal:     General: Bowel sounds are normal. There is no distension.     Palpations: Abdomen is soft.  Musculoskeletal:        General: No deformity. Normal range of motion.     Cervical back: Normal range of motion and neck supple.  Skin:    General: Skin is warm and dry.     Findings: No erythema or rash.  Neurological:     Mental Status: She is alert and oriented to person, place, and time. Mental status is at baseline.     Cranial Nerves: No cranial nerve deficit.     Coordination: Coordination normal.  Psychiatric:        Mood and Affect: Mood normal.     LABORATORY DATA:  I have reviewed the data as listed     Latest Ref Rng & Units 08/10/2022    8:52 AM 07/29/2022    8:21 AM 07/15/2022    9:07 AM  CBC  WBC 4.0 - 10.5 K/uL 3.6  8.3  3.7   Hemoglobin 12.0 - 15.0 g/dL 10.3  10.5  10.4   Hematocrit 36.0 - 46.0 % 32.7  33.2  32.4   Platelets 150 - 400 K/uL 158  147  205       Latest Ref Rng & Units 08/10/2022    8:52 AM 07/29/2022    8:21 AM 07/15/2022    9:07 AM  CMP  Glucose 70 - 99 mg/dL 104  106  122   BUN 8 - 23 mg/dL _0 Creatinine 0.44 - 1.00 mg/dL 0.70  0.79  0.77   Sodium 135 - 145 mmol/L 141  139  139   Potassium 3.5 - 5.1 mmol/L 3.3  3.4  3.4   Chloride 98 - 111 mmol/L 106  105  105   CO2 22 - 32 mmol/L _1 Calcium 8.9 - 10.3 mg/dL 9.2  9.2  9.3   Total Protein 6.5 - 8.1 g/dL 6.9  7.3  7.2   Total Bilirubin 0.3 - 1.2 mg/dL 0.6  0.4  0.7   Alkaline Phos 38 - 126 U/L 60  90  59   AST 15 - 41 U/L _2 ALT 0 - 44 U/L _3 RADIOGRAPHIC STUDIES: I have personally reviewed the radiological images as listed and agreed with the findings in the report. MM DIAG BREAST TOMO UNI LEFT  Result  Date: 07/31/2022 CLINICAL DATA:  The lung 76 year old female recalled from screening mammogram dated 07/09/2022 for a possible left breast mass. EXAM: DIGITAL DIAGNOSTIC UNILATERAL LEFT MAMMOGRAM WITH TOMOSYNTHESIS; ULTRASOUND LEFT BREAST LIMITED TECHNIQUE: Left digital diagnostic mammography and breast  tomosynthesis was performed.; Targeted ultrasound examination of the left breast was performed. COMPARISON:  Previous exam(s). ACR Breast Density Category b: There are scattered areas of fibroglandular density. FINDINGS: There is a persistent oval, circumscribed equal density mass in the upper outer left breast at mid depth. Note is made of associated coarse calcifications. Targeted ultrasound is performed, showing an oval, circumscribed hypoechoic mass with internal calcifications at the 2 o'clock position 10 cm from the nipple. It measures 8 x 4 x 7 mm without significant internal vascularity. This correlates well with the mammographic finding. 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 Recommend short-term follow-up. RECOMMENDATION: Left breast ultrasound in 6 months. I have discussed the findings and recommendations with the patient. If applicable, a reminder letter will be sent to the patient regarding the next appointment. BI-RADS CATEGORY  3: Probably benign. Electronically Signed   By: Kristopher Oppenheim M.D.   On: 07/31/2022 12:06  US BREAST LTD UNI LEFT INC AXILLA  Result Date: 07/31/2022 CLINICAL DATA:  The lung 76 year old female recalled from screening mammogram dated 07/09/2022 for a possible left breast mass. EXAM: DIGITAL DIAGNOSTIC UNILATERAL LEFT MAMMOGRAM WITH TOMOSYNTHESIS; ULTRASOUND LEFT BREAST LIMITED TECHNIQUE: Left digital diagnostic mammography and breast tomosynthesis was performed.; Targeted ultrasound examination of the left breast was performed. COMPARISON:  Previous exam(s). ACR Breast Density Category b: There are scattered areas of  fibroglandular density. FINDINGS: There is a persistent oval, circumscribed equal density mass in the upper outer left breast at mid depth. Note is made of associated coarse calcifications. Targeted ultrasound is performed, showing an oval, circumscribed hypoechoic mass with internal calcifications at the 2 o'clock position 10 cm from the nipple. It measures 8 x 4 x 7 mm without significant internal vascularity. This correlates well with the mammographic finding. 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 Recommend short-term follow-up. RECOMMENDATION: Left breast ultrasound in 6 months. I have discussed the findings and recommendations with the patient. If applicable, a reminder letter will be sent to the patient regarding the next appointment. BI-RADS CATEGORY  3: Probably benign. Electronically Signed   By: Kristopher Oppenheim M.D.   On: 07/31/2022 12:06  DG Bone Density  Result Date: 07/13/2022 EXAM: DUAL X-RAY ABSORPTIOMETRY (DXA) FOR BONE MINERAL DENSITY IMPRESSION: Your patient Maraya Gwilliam completed a BMD test on 07/09/2022 using the Door (software version: 14.10) manufactured by [employer]. The following summarizes the results of our evaluation. Technologist:VLM PATIENT BIOGRAPHICAL: Name: Shantina, Chronister Patient ID: [id number] Birth Date: 1946-09-19 Height: 57.0 in. Gender: Female Exam Date: 07/09/2022 Weight: 148.0 lbs. Indications: History of kidney disease, Hysterectomy, Postmenopausal Fractures: Treatments: Protonix DENSITOMETRY RESULTS: Site         Region     Measured Date Measured Age WHO Classification Young Adult T-score BMD         %Change vs. Previous Significant Change (*) AP Spine L2-L4 6/09/[age] 77.1 Normal 3.8 1.683 g/cm2 - - DualFemur Neck Left 07/09/2021 77.1 Normal 0.8 1.144 g/cm2 - - Left Forearm Radius 33% 6/09/2021 77.1 Normal 1.6 1.020 g/cm2 - - ASSESSMENT: The BMD measured at Femur Neck Left is  1.144 g/cm2 with a T-score of 0.8. This patient is considered normal according to [facility]) criteria. L-1 was excluded due to degenerative changes. The scan quality is good. [facility]) criteria for post-menopausal, Caucasian Women: Normal:  T-score at or above -1 SD Osteopenia/low bone mass: T-score between -1 and -2.5 SD Osteoporosis:             T-score at or below -2.5 SD RECOMMENDATIONS: 1. All patients should optimize calcium and vitamin D intake. 2. Consider FDA-approved medical therapies in postmenopausal women and men aged 48 years and older, based on the following: a. A hip or vertebral(clinical or morphometric) fracture b. T-score < -2.5 at the femoral neck or spine after appropriate evaluation to exclude secondary causes c. Low bone mass (T-score between -1.0 and -2.5 at the femoral neck or spine) and a 10-year probability of a hip fracture > 3% or a 10-year probability of a major osteoporosis-related fracture > 20% based on the [place]-adapted WHO algorithm 3. Clinician judgment and/or patient preferences may indicate treatment for people with 10-year fracture probabilities above or below these levels FOLLOW-UP: People with diagnosed cases of osteoporosis or at high risk for fracture should have regular bone mineral density tests. For patients eligible for Medicare, routine testing is allowed once every 2 years. The testing frequency can be increased to one year for patients who have rapidly progressing disease, those who are receiving or discontinuing medical therapy to restore bone mass, or have additional risk factors. I have reviewed this report, and agree with the above findings. [facility] Electronically Signed   By: Zerita Boers M.D.   On: 07/13/2022 07:57   MM 3D SCREEN BREAST BILATERAL  Result Date: 07/10/2022 CLINICAL DATA:  Screening. EXAM: DIGITAL SCREENING BILATERAL MAMMOGRAM WITH TOMOSYNTHESIS AND CAD TECHNIQUE:  Bilateral screening digital craniocaudal and mediolateral oblique mammograms were obtained. Bilateral screening digital breast tomosynthesis was performed. The images were evaluated with computer-aided detection. COMPARISON:  Previous exam(s). ACR Breast Density Category b: There are scattered areas of fibroglandular density. FINDINGS: In the left breast, a possible mass warrants further evaluation. In the right breast, no findings suspicious for malignancy. IMPRESSION: Further evaluation is suggested for a possible mass in the left breast. RECOMMENDATION: Diagnostic mammogram and possibly ultrasound of the left breast. (Code:[id number]) The patient will be contacted regarding the findings, and additional imaging will be scheduled. BI-RADS CATEGORY  0: Incomplete. Need additional imaging evaluation and/or prior mammograms for comparison. Electronically Signed   By: Kristopher Oppenheim M.D.   On: 07/10/2022 13:15   CT CHEST W CONTRAST  Result Date: 06/25/2022 CLINICAL DATA:  Aspiration. EXAM: CT CHEST WITH CONTRAST TECHNIQUE: Multidetector CT imaging of the chest was performed during intravenous contrast administration. RADIATION DOSE REDUCTION: This exam was performed according to the departmental dose-optimization program which includes automated exposure control, adjustment of the mA and/or kV according to patient size and/or use of iterative reconstruction technique. CONTRAST:  45m OMNIPAQUE IOHEXOL 300 MG/ML  SOLN COMPARISON:  CT of the chest abdomen pelvis dated 04/13/2022. FINDINGS: Cardiovascular: There is no cardiomegaly or pericardial effusion. Mild atherosclerotic calcification of the thoracic aorta. No aneurysmal dilatation or dissection. The origins of the great vessels of the aortic arch and the central pulmonary arteries are patent. Left-sided Port-A-Cath with tip in the right atrium close to the cavoatrial junction. Mediastinum/Nodes: No hilar or mediastinal adenopathy. Calcified granuloma. The  esophagus is grossly unremarkable. No mediastinal fluid collection. Lungs/Pleura: Patchy areas of airspace opacity in the right upper lobe, progressed since the prior radiograph may represent worsening or recurrent pneumonia. Clinical correlation and follow-up to resolution recommended. Partially calcified right middle lobe nodule, likely granuloma. Patchy areas of ground-glass density involving the left lower lobe and lingula  similar or slightly progressed since the prior radiograph and may represent worsening infiltrate. Aspiration is not excluded. No pleural effusion or pneumothorax. The central airways are patent. Upper Abdomen: Probable gallbladder sludge or small stones. Fatty liver with a possible changes of cirrhosis. Small bilateral renal calculi. Areas of cortical irregularity and scarring of the kidneys. Musculoskeletal: Osteopenia with degenerative changes of the spine. 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 No acute osseous pathology. IMPRESSION: 1. Patchy areas of airspace opacity in the right upper lobe and left lower lobe and lingula slightly progressed since the prior radiograph and may represent worsening or recurrent pneumonia. Clinical correlation and follow-up to resolution recommended. 2. Aortic Atherosclerosis (ICD10-I70.0). Electronically Signed   By: Anner Crete M.D.   On: 06/25/2022 18:06   CT SOFT TISSUE NECK W CONTRAST  Result Date: 06/25/2022 CLINICAL DATA:  Swallowing difficulties, palate weakness (CN 9) EXAM: CT NECK WITH CONTRAST TECHNIQUE: Multidetector CT imaging of the neck was performed using the standard protocol following the bolus administration of intravenous contrast. RADIATION DOSE REDUCTION: This exam was performed according to the departmental dose-optimization program which includes automated exposure control, adjustment of the mA and/or kV according to patient size and/or use of iterative reconstruction  technique. CONTRAST:  45m OMNIPAQUE IOHEXOL 300 MG/ML  SOLN COMPARISON:  None Available. FINDINGS: Pharynx and larynx: Normal. No mass or swelling. Salivary glands: No inflammation, mass, or stone. Thyroid: Normal. Lymph nodes: None enlarged or abnormal density. Vascular: Patent. Limited intracranial: Negative. Visualized orbits: Negative. Mastoids and visualized paranasal sinuses: Clear. Skeleton: No acute osseous abnormality. Posterior fusion and decompression C3-C6. Upper chest: Please see same-day CT chest. Other: None. IMPRESSION: 1. No acute process in the neck to explain the patient's difficulty swallowing. 2.  For findings in the thorax, please see same day CT chest. Electronically Signed   By: AMerilyn BabaM.D.   On: 06/25/2022 18:02   CT ABDOMEN PELVIS W CONTRAST  Result Date: 06/24/2022 CLINICAL DATA:  Pain right lower quadrant, pelvic pain, vomiting, metastatic colon carcinoma EXAM: CT ABDOMEN AND PELVIS WITH CONTRAST TECHNIQUE: Multidetector CT imaging of the abdomen and pelvis was performed using the standard protocol following bolus administration of intravenous contrast. RADIATION DOSE REDUCTION: This exam was performed according to the departmental dose-optimization program which includes automated exposure control, adjustment of the mA and/or kV according to patient size and/or use of iterative reconstruction technique. CONTRAST:  1026mOMNIPAQUE IOHEXOL 300 MG/ML  SOLN COMPARISON:  Previous studies including the examination of 04/13/2022 FINDINGS: Lower chest: Small patchy infiltrates are seen in lower lung fields, more so in the left lower lobe. There is calcified granuloma in left lower lung field. Hepatobiliary: There are a few low-density lesions in liver largest measuring 2 cm with no significant interval change. There is fatty infiltration in liver. Gallbladder is distended. There is no wall thickening. There is no dilation of bile ducts. Pancreas: No focal abnormalities are seen.  Spleen: Unremarkable. Adrenals/Urinary Tract: Adrenals are unremarkable. There is no hydronephrosis. 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 There is 2 mm calculus in the right kidney. There is 2 mm calculus in the left kidney. Urinary bladder is distended. Diverticulum is noted in the left posterolateral wall of the bladder. Stomach/Bowel: Stomach is not distended. Small bowel loops are not dilated. Appendix is not seen. There is no pericecal inflammation. Wall thickening in cecum appears less prominent. In the current study, maximum wall thickness in the medial aspect  of the cecum measures 12 mm. Few scattered diverticula are seen in colon without signs of focal acute diverticulitis. Vascular/Lymphatic: Scattered arterial calcifications are seen. Reproductive: Uterus is not seen. There is 5.5 x 4.2 cm inhomogeneous soft tissue mass and fluid density in vaginal cuff with no significant interval change. Small amount of fluid is seen in the vaginal canal. Other: There is no ascites or pneumoperitoneum. Small umbilical hernia containing fat is seen. Musculoskeletal: There is minimal anterolisthesis at L4-L5 level. There is severe spinal stenosis and encroachment of neural foramina at L4-L5 level. There is encroachment of neural foramina at L5-S1 level, more so on the right side. There is partial fusion of bodies of L5 and S1 vertebrae. IMPRESSION: There are low-density lesions in liver consistent with metastatic disease with no significant change. Wall thickening in the cecum appears less prominent. There is 5.5 x 4.2 cm mixed density lesion in vaginal cuff suggesting possible metastatic disease. There is no evidence of intestinal obstruction or pneumoperitoneum. There is no hydronephrosis. Small patchy infiltrates are seen in the lower lung fields suggesting atelectasis/pneumonia. Small bilateral renal stones. Diverticulosis of colon without signs of diverticulitis. 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 There is encroachment of neural foramina at L4-L5 and L5-S1 levels. Other findings as described in the body of the report. Electronically Signed   By: Elmer Picker M.D.   On: 06/24/2022 14:27   DG Chest 2 View  Result Date: 06/24/2022 CLINICAL DATA:  Dysphagia. EXAM: CHEST - 2 VIEW COMPARISON:  Chest x-ray 06/17/2021. FINDINGS: Similar mild enlargement the cardiac silhouette. Left IJ approach Port-A-Cath with the tip projecting at the superior right atrium. No consolidation. No visible pleural effusions or pneumothorax. Partially imaged ACDF. Polyarticular degenerative change. 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 IMPRESSION: No evidence of acute cardiopulmonary disease. Electronically Signed   By: Margaretha Sheffield M.D.   On: 06/24/2022 12:18

## 2022-08-10 NOTE — Patient Instructions (Signed)
[facility] [place]  Discharge Instructions: Thank you for choosing [facility] to provide your oncology and hematology care.  If you have a lab appointment with the [facility], please go directly to the [facility] [place] and check in at the registration area.  Wear comfortable clothing and clothing appropriate for easy access to any Portacath or PICC line.   We strive to give you quality time with your provider. You may need to reschedule your appointment if you arrive late (15 or more minutes).  Arriving late affects you and other patients whose appointments are after yours.  Also, if you miss three or more appointments without notifying the office, you may be dismissed from the clinic at the provider's discretion.      For prescription refill requests, have your pharmacy contact our office and allow 72 hours for refills to be completed.    Today you received the following chemotherapy and/or immunotherapy agents Zirabev, Oxaliplatin, Leucovorin and Adrucil       To help prevent nausea and vomiting after your treatment, we encourage you to take your nausea medication as directed.  BELOW ARE SYMPTOMS THAT SHOULD BE REPORTED IMMEDIATELY: *FEVER GREATER THAN 100.4 F (38 C) OR HIGHER *CHILLS OR SWEATING *NAUSEA AND VOMITING THAT IS NOT CONTROLLED WITH YOUR NAUSEA MEDICATION *UNUSUAL SHORTNESS OF BREATH *UNUSUAL BRUISING OR BLEEDING *URINARY PROBLEMS (pain or burning when urinating, or frequent urination) *BOWEL PROBLEMS (unusual diarrhea, constipation, pain near the anus) TENDERNESS IN MOUTH AND THROAT WITH OR WITHOUT PRESENCE OF ULCERS (sore throat, sores in mouth, or a toothache) UNUSUAL RASH, SWELLING OR PAIN  UNUSUAL VAGINAL DISCHARGE OR ITCHING   Items with * indicate a potential emergency and should be followed up as soon as possible or go to the Emergency Department if any problems should occur.  Please show the CHEMOTHERAPY ALERT CARD or  IMMUNOTHERAPY ALERT CARD at check-in to the Emergency Department and triage nurse.  Should you have questions after your visit or need to cancel or reschedule your appointment, please contact [facility] [place] [facility] [place]  [phone] and follow the prompts.  Office hours are 8:00 a.m. to 4:30 p.m. Monday - Friday. Please note that voicemails left after 4:00 p.m. may not be returned until the following business day.  We are closed weekends and major holidays. You have access to a nurse at all times for urgent questions. Please call the main number to the clinic [phone] and follow the prompts.  For any non-urgent questions, you may also contact your provider using MyChart. We now offer e-Visits for anyone 65 and older to request care online for non-urgent symptoms. For details visit [email or web address].   Also download the MyChart app! Go to the app store, search "MyChart", open the app, select [facility], and log in with your MyChart username and password.  Masks are optional in the cancer centers. If you would like for your care team to wear a mask while they are taking care of you, please let them know. For doctor visits, patients may have with them one support person who is at least 77 years old. At this time, visitors are not allowed in the infusion area.

## 2022-08-10 NOTE — Assessment & Plan Note (Addendum)
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  No sufficient tissue for NGS Liquid biopsy showed KRAS12V, APC, TP53 mutation, TMB 4.  S/p 6 cycles of 5-FU /Bevacizumab--> CT showed mixed response--> FOLFOX/bevacizumab.-->CT imaging shows stable disease.  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  Stable disease.

## 2022-08-10 NOTE — Assessment & Plan Note (Signed)
Chemotherapy plan as listed above 

## 2022-08-10 NOTE — Assessment & Plan Note (Addendum)
Hemoglobin is stable.  Continue monitor.   

## 2022-08-10 NOTE — Assessment & Plan Note (Addendum)
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  Bilateral fingertips, pre-existing right side neuropathy is slightly worse. Left side is intermittent.  gabapentin to 200 mg 3 times daily.  Oxaliplatin has been dose reduced to '65mg'$ /m2. Consider switching to Irinotecan if neuropathy further exacerbated. Dicussed with patient and daughter.

## 2022-08-11 LAB — CEA: CEA: 2.5 ng/mL (ref 0.0–4.7)

## 2022-08-12 ENCOUNTER — Inpatient Hospital Stay: Payer: Medicare HMO

## 2022-08-12 VITALS — BP 134/91 | HR 93 | Temp 98.7°F | Resp 16

## 2022-08-12 DIAGNOSIS — D6481 Anemia due to antineoplastic chemotherapy: Secondary | ICD-10-CM | POA: Diagnosis not present

## 2022-08-12 DIAGNOSIS — C180 Malignant neoplasm of cecum: Secondary | ICD-10-CM | POA: Diagnosis not present

## 2022-08-12 DIAGNOSIS — Z79899 Other long term (current) drug therapy: Secondary | ICD-10-CM | POA: Diagnosis not present

## 2022-08-12 DIAGNOSIS — Z5112 Encounter for antineoplastic immunotherapy: Secondary | ICD-10-CM | POA: Diagnosis not present

## 2022-08-12 DIAGNOSIS — C787 Secondary malignant neoplasm of liver and intrahepatic bile duct: Secondary | ICD-10-CM | POA: Diagnosis not present

## 2022-08-12 DIAGNOSIS — Z5111 Encounter for antineoplastic chemotherapy: Secondary | ICD-10-CM | POA: Diagnosis not present

## 2022-08-12 MED ORDER — PEGFILGRASTIM-CBQV 6 MG/0.6ML ~~LOC~~ SOSY
6.0000 mg | PREFILLED_SYRINGE | Freq: Once | SUBCUTANEOUS | Status: AC
  Administered 2022-08-12: 6 mg via SUBCUTANEOUS
  Filled 2022-08-12: qty 0.6

## 2022-08-12 MED ORDER — HEPARIN SOD (PORK) LOCK FLUSH 100 UNIT/ML IV SOLN
INTRAVENOUS | Status: AC
  Filled 2022-08-12: qty 5

## 2022-08-12 MED ORDER — HEPARIN SOD (PORK) LOCK FLUSH 100 UNIT/ML IV SOLN
500.0000 [IU] | Freq: Once | INTRAVENOUS | Status: AC | PRN
  Administered 2022-08-12: 500 [IU]
  Filled 2022-08-12: qty 5

## 2022-08-12 MED ORDER — SODIUM CHLORIDE 0.9% FLUSH
10.0000 mL | INTRAVENOUS | Status: DC | PRN
  Filled 2022-08-12: qty 10

## 2022-08-17 ENCOUNTER — Other Ambulatory Visit: Payer: Self-pay | Admitting: Internal Medicine

## 2022-08-17 DIAGNOSIS — F411 Generalized anxiety disorder: Secondary | ICD-10-CM

## 2022-08-18 NOTE — Telephone Encounter (Signed)
Requested medication (s) are due for refill today:   Provider to review  Requested medication (s) are on the active medication list:   Yes  Future visit scheduled:   Yes   Last ordered: 07/10/2022 #60, 0 refills  Non delegated refill   Requested Prescriptions  Pending Prescriptions Disp Refills   ALPRAZolam (XANAX) 0.5 MG tablet [Pharmacy Med Name: ALPRAZOLAM 0.5 MG TABLET] 60 tablet 0    Sig: TAKE 1 TABLET BY MOUTH TWICE A DAY AS NEEDED FOR ANXIETY 10.11.23     Not Delegated - Psychiatry: Anxiolytics/Hypnotics 2 Failed - 08/17/2022 12:17 PM      Failed - This refill cannot be delegated      Failed - Urine Drug Screen completed in last 360 days      Passed - Patient is not pregnant      Passed - Valid encounter within last 6 months    Recent Outpatient Visits           1 month ago Erosive gastritis   [facility] [place], Coralie Keens, NP   3 months ago Encounter for general adult medical examination with abnormal findings   [facility] [place], Coralie Keens, NP   6 months ago Right leg weakness   [facility], Coralie Keens, NP   9 months ago Pure hypercholesterolemia   [facility] [place], Coralie Keens, NP   11 months ago 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 [facility])   [facility] [place], Coralie Keens, NP       Future Appointments             In 1 month [place], Coralie Keens, NP [facility]

## 2022-08-24 ENCOUNTER — Inpatient Hospital Stay: Payer: Medicare HMO

## 2022-08-24 ENCOUNTER — Inpatient Hospital Stay (HOSPITAL_BASED_OUTPATIENT_CLINIC_OR_DEPARTMENT_OTHER): Payer: Medicare HMO | Admitting: Oncology

## 2022-08-24 ENCOUNTER — Ambulatory Visit: Payer: Medicare HMO

## 2022-08-24 ENCOUNTER — Encounter: Payer: Self-pay | Admitting: Oncology

## 2022-08-24 ENCOUNTER — Ambulatory Visit: Payer: Medicare HMO | Admitting: Oncology

## 2022-08-24 ENCOUNTER — Other Ambulatory Visit: Payer: Medicare HMO

## 2022-08-24 VITALS — BP 118/95 | HR 91 | Temp 98.5°F | Resp 18 | Wt 150.5 lb

## 2022-08-24 DIAGNOSIS — T451X5A Adverse effect of antineoplastic and immunosuppressive drugs, initial encounter: Secondary | ICD-10-CM

## 2022-08-24 DIAGNOSIS — Z5111 Encounter for antineoplastic chemotherapy: Secondary | ICD-10-CM | POA: Diagnosis not present

## 2022-08-24 DIAGNOSIS — C787 Secondary malignant neoplasm of liver and intrahepatic bile duct: Secondary | ICD-10-CM | POA: Diagnosis not present

## 2022-08-24 DIAGNOSIS — Z5112 Encounter for antineoplastic immunotherapy: Secondary | ICD-10-CM | POA: Diagnosis not present

## 2022-08-24 DIAGNOSIS — D6481 Anemia due to antineoplastic chemotherapy: Secondary | ICD-10-CM

## 2022-08-24 DIAGNOSIS — G629 Polyneuropathy, unspecified: Secondary | ICD-10-CM | POA: Diagnosis not present

## 2022-08-24 DIAGNOSIS — C180 Malignant neoplasm of cecum: Secondary | ICD-10-CM | POA: Diagnosis not present

## 2022-08-24 DIAGNOSIS — C189 Malignant neoplasm of colon, unspecified: Secondary | ICD-10-CM | POA: Diagnosis not present

## 2022-08-24 DIAGNOSIS — D701 Agranulocytosis secondary to cancer chemotherapy: Secondary | ICD-10-CM

## 2022-08-24 DIAGNOSIS — Z79899 Other long term (current) drug therapy: Secondary | ICD-10-CM | POA: Diagnosis not present

## 2022-08-24 LAB — CBC WITH DIFFERENTIAL/PLATELET
Abs Immature Granulocytes: 0.18 10*3/uL — ABNORMAL HIGH (ref 0.00–0.07)
Basophils Absolute: 0.1 10*3/uL (ref 0.0–0.1)
Basophils Relative: 1 %
Eosinophils Absolute: 0.1 10*3/uL (ref 0.0–0.5)
Eosinophils Relative: 1 %
HCT: 32.7 % — ABNORMAL LOW (ref 36.0–46.0)
Hemoglobin: 10.1 g/dL — ABNORMAL LOW (ref 12.0–15.0)
Immature Granulocytes: 2 %
Lymphocytes Relative: 15 %
Lymphs Abs: 1.8 10*3/uL (ref 0.7–4.0)
MCH: 31.9 pg (ref 26.0–34.0)
MCHC: 30.9 g/dL (ref 30.0–36.0)
MCV: 103.2 fL — ABNORMAL HIGH (ref 80.0–100.0)
Monocytes Absolute: 1.1 10*3/uL — ABNORMAL HIGH (ref 0.1–1.0)
Monocytes Relative: 10 %
Neutro Abs: 8.1 10*3/uL — ABNORMAL HIGH (ref 1.7–7.7)
Neutrophils Relative %: 71 %
Platelets: 169 10*3/uL (ref 150–400)
RBC: 3.17 MIL/uL — ABNORMAL LOW (ref 3.87–5.11)
RDW: 18.6 % — ABNORMAL HIGH (ref 11.5–15.5)
WBC: 11.4 10*3/uL — ABNORMAL HIGH (ref 4.0–10.5)
nRBC: 0.4 % — ABNORMAL HIGH (ref 0.0–0.2)

## 2022-08-24 LAB — COMPREHENSIVE METABOLIC PANEL
ALT: 11 U/L (ref 0–44)
AST: 23 U/L (ref 15–41)
Albumin: 3.2 g/dL — ABNORMAL LOW (ref 3.5–5.0)
Alkaline Phosphatase: 104 U/L (ref 38–126)
Anion gap: 5 (ref 5–15)
BUN: 10 mg/dL (ref 8–23)
CO2: 27 mmol/L (ref 22–32)
Calcium: 8.7 mg/dL — ABNORMAL LOW (ref 8.9–10.3)
Chloride: 105 mmol/L (ref 98–111)
Creatinine, Ser: 0.79 mg/dL (ref 0.44–1.00)
GFR, Estimated: >60 mL/min (ref >60)
Glucose, Bld: 101 mg/dL — ABNORMAL HIGH (ref 70–99)
Potassium: 3.1 mmol/L — ABNORMAL LOW (ref 3.5–5.1)
Sodium: 137 mmol/L (ref 135–145)
Total Bilirubin: 0.4 mg/dL (ref 0.3–1.2)
Total Protein: 7 g/dL (ref 6.5–8.1)

## 2022-08-24 LAB — PROTEIN, URINE, RANDOM: Total Protein, Urine: 9 mg/dL

## 2022-08-24 MED ORDER — SODIUM CHLORIDE 0.9 % IV SOLN
5.0000 mg/kg | Freq: Once | INTRAVENOUS | Status: AC
  Administered 2022-08-24: 350 mg via INTRAVENOUS
  Filled 2022-08-24: qty 14

## 2022-08-24 MED ORDER — LEUCOVORIN CALCIUM INJECTION 350 MG
400.0000 mg/m2 | Freq: Once | INTRAVENOUS | Status: AC
  Administered 2022-08-24: 660 mg via INTRAVENOUS
  Filled 2022-08-24: qty 33

## 2022-08-24 MED ORDER — SODIUM CHLORIDE 0.9 % IV SOLN
Freq: Once | INTRAVENOUS | Status: AC
  Filled 2022-08-24: qty 250

## 2022-08-24 MED ORDER — POTASSIUM CHLORIDE 20 MEQ/100ML IV SOLN
20.0000 meq | Freq: Once | INTRAVENOUS | Status: AC
  Administered 2022-08-24: 20 meq via INTRAVENOUS

## 2022-08-24 MED ORDER — PALONOSETRON HCL INJECTION 0.25 MG/5ML
0.2500 mg | Freq: Once | INTRAVENOUS | Status: AC
  Administered 2022-08-24: 0.25 mg via INTRAVENOUS
  Filled 2022-08-24: qty 5

## 2022-08-24 MED ORDER — DEXTROSE 5 % IV SOLN
INTRAVENOUS | Status: DC
  Filled 2022-08-24: qty 250

## 2022-08-24 MED ORDER — SODIUM CHLORIDE 0.9 % IV SOLN
2400.0000 mg/m2 | INTRAVENOUS | Status: DC
  Administered 2022-08-24: 3950 mg via INTRAVENOUS
  Filled 2022-08-24: qty 79

## 2022-08-24 MED ORDER — OXALIPLATIN CHEMO INJECTION 100 MG/20ML
61.0000 mg/m2 | Freq: Once | INTRAVENOUS | Status: AC
  Administered 2022-08-24: 100 mg via INTRAVENOUS
  Filled 2022-08-24: qty 20

## 2022-08-24 MED ORDER — PROCHLORPERAZINE MALEATE 10 MG PO TABS
10.0000 mg | ORAL_TABLET | Freq: Once | ORAL | Status: AC
  Administered 2022-08-24: 10 mg via ORAL
  Filled 2022-08-24: qty 1

## 2022-08-24 NOTE — Assessment & Plan Note (Signed)
Chemotherapy plan as listed above 

## 2022-08-24 NOTE — Progress Notes (Signed)
Hematology/Oncology Progress note [phone] [phone]         Patient Care Team: Chelsea Fenton, NP as PCP - General (Internal Medicine) Chelsea Jacks, RN as Oncology Nurse Navigator Chelsea Server, MD as Consulting Physician (Oncology)  ASSESSMENT & PLAN:   Cancer Staging  Metastatic colon cancer to liver [facility]) Staging form: Colon and Rectum, AJCC 8th Edition - Clinical stage from 09/04/2021: Stage Unknown (cTX, cNX, cM1) - Signed by Chelsea Server, MD on 05/13/2022   Metastatic colon cancer to liver [facility]) #Metastatic colon cancer to liver, 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  No sufficient tissue for NGS Liquid biopsy showed KRAS12V, APC, TP53 mutation, TMB 4.  S/p 6 cycles of 5-FU /Bevacizumab--> CT showed mixed response--> FOLFOX/bevacizumab.-->CT imaging shows stable disease.  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  Stable disease.  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  Bilateral fingertips, pre-existing right side neuropathy is slightly worse. Left side is intermittent.  gabapentin to 200 mg 3 times daily.  Oxaliplatin has been dose reduced to 11m/m2. Consider switching to Irinotecan if neuropathy further exacerbated. Dicussed with patient and daughter.  Patient prefers to stay on Oxaliplatin as she feels neuropathy is within her acceptable level.   Encounter for antineoplastic chemotherapy Chemotherapy plan as listed above  Anemia due to antineoplastic chemotherapy Hemoglobin is stable.  Continue monitor.    Chemotherapy induced neutropenia (HCC) patient gets prophylactic G-CSF with Udenyca on day 3.  Claritin 10 mg daily for 4 days.     Orders Placed This Encounter  Procedures   CT CHEST ABDOMEN PELVIS W CONTRAST    To be done during the week of 09/28/22    Standing Status:    Future    Standing Expiration Date:   08/25/2023    Order Specific Question:   If indicated for the ordered procedure, I authorize the administration of contrast media per Radiology protocol    Answer:   Yes    Order Specific Question:   Does the patient have a contrast media/X-ray dye allergy?    Answer:   No    Order Specific Question:   Preferred imaging location?    Answer:   [facility]    Order Specific Question:   Is Oral Contrast requested for this exam?    Answer:   Yes, Per Radiology protocol    Order Specific Question:   Call Results- Best Contact Number?    Answer:   colon cancer   Amb Referral to Neuro Oncology    Referral Priority:   Routine    Referral Type:   Consultation    Referral Reason:   Specialty Services Required    Requested Specialty:   Oncology    Number of Visits Requested:   1    I spent time and answered all patient's questions regarding why resuming chemotherapy is recommended.  All questions were answered to her satisfaction.   Follow-up in  2 weels  lab [name]. All questions were answered. The patient knows to call the clinic with any problems, questions or concerns.  [name], [name] 08/24/2022   CHIEF COMPLAINTS/REASON FOR VISIT:  metastatic colon cancer  HISTORY OF PRESENTING ILLNESS:   Chelsea [name] a  76y.o.  female presents for management of metastatic colon cancer. Oncology  History  Metastatic colon cancer to liver ([place])  08/21/2021 Imaging   CT abdomen pelvis with contrast  showed soft tissue mass at the base of cecum measuring up to 5 cm, slightly increased in size.  Interval development of multiple low-density lesions throughout the liver highly suggestive for metastatic disease.  Complex mass in the region of the vaginal cuff measuring up to 5.2 cm, increased in size.  Right ovary also appears more prominent on the current examination.  Moderate  large volume of stool throughout the colon.  Mild urinary bladder wall thickening.  Colonic diverticulosis without evidence of active diverticulitis.  Aortic atherosclerosis   09/04/2021 Procedure   patient is status post left lobe liver lesion biopsy and pathology showed metastatic adenocarcinoma, compatible with colorectal primary.   09/04/2021 Cancer Staging   Staging form: Colon and Rectum, AJCC 8th Edition - Clinical stage from 09/04/2021: Stage Unknown (cTX, cNX, cM1) - Signed by Chelsea Server, MD on 05/13/2022 Stage prefix: Initial diagnosis   09/16/2021 Initial Diagnosis   Metastatic colon cancer to liver [facility] one liquid biopsy testing showed KRASG12V, APC TP53 TMB 4  Patient initially went to emergency room on 03/24/2021 for abdominal pain. 03/24/2021, CT scan of the abdomen showed marked bladder wall thickening with surrounding soft tissue stranding is noted.  Concerning for cystitis.  There is a lobulated mass between the posterior wall of the bladder and the rectum which appears to arise from the vaginal cuff.  This is indeterminate and difficult to characterize reflecting lack of IV contrast material.  Nonobstructing left renal calculus, left sacroiliitis, lumbar spondylosis aortic atherosclerosis. 03/24/2021 CT pelvic showed abdominal [place] soft tissue of right cecum, concerning for colon carcinoma.  Colonoscopy recommended.  Low-attenuation mass in the region of vaginal cuff is again noted.  Diffuse urinary bladder wall thickening and mild bilateral hydroureter possibly cystitis.   [place] 03/24/2021 Lobulated solid 3.8 cm mass confirmed by ultrasound. This is most compatible with a neoplasm but remains indeterminate as to the origin as the epicenter seems to be outside of both the vaginal cuff and the adjacent colon, while the lesion appears inseparable from both. Extensive echogenic debris within the distended urinary bladder.   03/26/2021, patient was seen by Chelsea [name] for  evaluation of colonic/vaginal cuff pelvic mass.  Patient also was referred to gastroenterology for colonoscopy.   03/26/2021 CEA 3.4.  CA125 9.7 04/04/2021, status post colonoscopy which showed a frond like /villous nonobstructing large mass was found in the cecum, this was biopsied.  Terminal ileum was briefly intubated and appeared normal.  Diverticulosis in the sigmoid colon.  The rectum, sigmoid colon, descending colon, transverse colon and ascending colon were normal.  Nonbleeding internal hemorrhoids. 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 Given that the biopsy may not be representative of the entire underlying lesion.  Patient was recommended to establish with surgeon for evaluation. Patient no showed for her surgery appointment and as well as her follow-up appointment with gastroenterology.  Per daughter, patient was having cervical spine surgery and she prioritized that over the colon surgery.   10/20/2021 - 01/07/2022 Chemotherapy    COLORECTAL 5-FU  + Bevacizumab q14d      10/20/2021 -  Chemotherapy   Patient is on Treatment Plan : COLORECTAL 5FU + Leucovorin (Modified [place]) + Bevacizumab q14d     12/22/2021 Imaging   CT chest abdomen pelvis Decreased cecum mass, however increased size and size of metastatic lesions throughout the liver.  Interval enlargement of vaginal cuff  mass. No mets in chest.    01/21/2022 -  Chemotherapy   COLORECTAL FOLFOX  + Bevacizumab q14d    patient was seen by gynecology oncology Dr. Fransisca Connors.  Biopsy of the vaginal mass was not recommended.Patient's case was also discussed on multidisciplinary tumor board.  IR potentially can try a biopsy if patient is willing to.I had a discussion with patient's daughter over the phone prior to this visit.  We discussed about option of adding oxaliplatin to 5-FU/bevacizumab and continue treatment of colon cancer, repeat CT scan short-term and if progression, repeat biopsy versus proceeding  with vaginal mass biopsy.  Daughter prefers proceeding with chemotherapy and defer biopsy.   01/08/2022 patient has had a second opinion at [facility] and was seen by Dr. Reynaldo Minium .  Dr. Reynaldo Minium agrees with the current treatment plan with FOLFOX/bevacizumab.  He has ordered guardant 360 to see if she may be eligible for clinical trial in the future.  -   04/13/2022 Imaging    CT chest abdomen pelvis showed a cecal mass with stable hepatic metastasis.  Mixed cystic and solid mass in the region of the vaginal cuff, new area of patchy groundglass and consolidation in the right upper lobe, lingula and left lower lobe.  Likely due to pneumonia.  Hepatic steatosis.  Bilateral renal stones.  Aortic atherosclerosis   06/24/2022 Imaging   CT abdomen pelvis with contrast showed There are low-density lesions in liver consistent with metastatic disease with no significant change. Wall thickening in the cecum appears less prominent. There is 5.5 x 4.2 cm mixed density lesion in vaginal cuff suggesting possible metastatic disease.   There is no evidence of intestinal obstruction or pneumoperitoneum.There is no hydronephrosis.   Small patchy infiltrates are seen in the lower lung fields suggesting atelectasis/pneumonia.   Small bilateral renal stones. Diverticulosis of colon without signs of diverticulitis. 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 There is encroachment of neural foramina at L4-L5 and L5-S1 levels.     06/25/2022 Imaging   CT soft tissue neck with contrast showed1No acute process in the neck to explain the patient's difficulty swallowing.  CT chest contrast showed 1. Patchy areas of airspace opacity in the right upper lobe and left lower lobe and lingula slightly progressed since the prior radiograph and may represent worsening or recurrent pneumonia. Clinical correlation and follow-up to resolution recommended. 2. Aortic Atherosclerosis     06/24/2022 - 06/26/2022 patient  was hospitalized due to difficulty swallowing, 1 day of abdominal pain.  Patient had extensive work-up including EGD which showed erosive gastritis.  No significant esophageal or gastric abnormality.  CT scan showed possible aspiration induced pneumonitis.  Antibiotics was held.  Today patient denies any fever, chills,.  Occasionally she has cough.  There is plan of possible relocation to [place] area next year.   INTERVAL HISTORY SHARETTA RICCHIO is a 76 y.o. female who has above history reviewed by me today presents for follow up visit for management of metastatic colon cancer Patient was accompanied by daughter + fatigue, improved.  No nausea vomiting diarrhea today.  Right side pre-existing neuropathy, worse. On gabapentin.  Left hand finger tip neuropathy intermittent. Decreased taste, "sandy taste'.    Review of Systems  Constitutional:  Positive for fatigue. Negative for appetite change, chills, fever and unexpected weight change.  HENT:   Negative for hearing loss and voice change.   Eyes:  Negative for eye problems.  Respiratory:  Negative for chest tightness and cough.  Cardiovascular:  Negative for chest pain.  Gastrointestinal:  Negative for abdominal distention, abdominal pain, blood in stool and diarrhea.  Endocrine: Negative for hot flashes.  Genitourinary:  Negative for difficulty urinating and frequency.   Musculoskeletal:  Positive for back pain. Negative for arthralgias.  Skin:  Negative for itching and rash.  Neurological:  Positive for numbness (chornic finger tips). Negative for extremity weakness.  Hematological:  Negative for adenopathy.  Psychiatric/Behavioral:  Negative for confusion.     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)    Moderate mitral insufficiency    Port-A-Cath in place    Seizures [facility])     Spinal stenosis    Ulcer    Urinary incontinence     SURGICAL HISTORY: Past Surgical History:  Procedure Laterality Date   ABDOMINAL HYSTERECTOMY     BACK SURGERY     due to polio   BREAST BIOPSY Bilateral    neg   BREAST BIOPSY Right 2011   neg/stereo   CARPAL TUNNEL RELEASE     CATARACT EXTRACTION Right 2020   COLONOSCOPY WITH PROPOFOL N/A 04/04/2021   Procedure: COLONOSCOPY WITH PROPOFOL;  Surgeon: Virgel Manifold, MD;  Location: [facility] ENDOSCOPY;  Service: Endoscopy;  Laterality: N/A;   ESOPHAGOGASTRODUODENOSCOPY (EGD) WITH PROPOFOL N/A 06/25/2022   Procedure: ESOPHAGOGASTRODUODENOSCOPY (EGD) WITH PROPOFOL;  Surgeon: Lin Landsman, MD;  Location: [facility] ENDOSCOPY;  Service: Gastroenterology;  Laterality: N/A;   EYE SURGERY     IR IMAGING GUIDED PORT INSERTION  10/09/2021    SOCIAL HISTORY: Social History   Socioeconomic History   Marital status: Divorced    Spouse name: Not on file   Number of children: Not on file   Years of education: Not on file   Highest education level: Not on file  Occupational History   Not on file  Tobacco Use   Smoking status: Never   Smokeless tobacco: Never  Vaping Use   Vaping Use: Never used  Substance and Sexual Activity   Alcohol use: Never   Drug use: No   Sexual activity: Not Currently  Other Topics Concern   Not on file  Social History Narrative   Not on file   Social Determinants of Health   Financial Resource Strain: Low Risk  (01/12/2022)   Overall Financial Resource Strain (CARDIA)    Difficulty of Paying Living Expenses: Not very hard  Food Insecurity: No Food Insecurity (06/24/2022)   Hunger Vital Sign    Worried About Running Out of Food in the Last Year: Never true    Ran Out of Food in the Last Year: Never true  Transportation Needs: No Transportation Needs (06/24/2022)   PRAPARE - Hydrologist (Medical): No    Lack of Transportation (Non-Medical): No  Physical Activity:  Inactive (01/12/2022)   Exercise Vital Sign    Days of Exercise per Week: 0 days    Minutes of Exercise per Session: 0 min  Stress: Stress Concern Present (01/12/2022)   [place]    Feeling of Stress : To some extent  Social Connections: Moderately Integrated (01/12/2022)   Social Connection and Isolation Panel [NHANES]    Frequency of Communication with Friends and Family: Three times a week  Frequency of Social Gatherings with Friends and Family: Three times a week    Attends Religious Services: 1 to 4 times per year    Active Member of Clubs or Organizations: No    Attends Archivist Meetings: 1 to 4 times per year    Marital Status: Widowed  Intimate Partner Violence: Not At Risk (06/24/2022)   Humiliation, Afraid, Rape, and Kick questionnaire    Fear of Current or Ex-Partner: No    Emotionally Abused: No    Physically Abused: No    Sexually Abused: No    FAMILY HISTORY: Family History  Problem Relation Age of Onset   Arthritis Mother    Heart disease Mother    Stroke Mother    Hypertension Mother    COPD Mother    Sudden death Sister    Other Father        unknown medical history   Breast cancer Neg Hx     ALLERGIES:  is allergic to penicillins.  MEDICATIONS:  Current Outpatient Medications  Medication Sig Dispense Refill   acetaminophen (TYLENOL) 500 MG tablet Take 500 mg by mouth every 6 (six) hours as needed.     ALPRAZolam (XANAX) 0.5 MG tablet TAKE 1 TABLET BY MOUTH TWICE A DAY AS NEEDED FOR ANXIETY 10.11.23 60 tablet 0   atorvastatin (LIPITOR) 10 MG tablet TAKE 1 TABLET BY MOUTH EVERY DAY 90 tablet 0   busPIRone (BUSPAR) 5 MG tablet Take 1 tablet (5 mg total) by mouth 3 (three) times daily. 270 tablet 1   Cyanocobalamin (VITAMIN B 12 PO) Take 500 mcg by mouth daily.     diclofenac Sodium (VOLTAREN) 1 % GEL Apply 4 g topically 4 (four) times daily. 4 g 0   docusate sodium (COLACE) 100  MG capsule Take 1 capsule (100 mg total) by mouth 2 (two) times daily. 60 capsule 0   FLUoxetine (PROZAC) 20 MG capsule TAKE 3 CAPSULES (60 MG TOTAL) BY MOUTH EVERY MORNING. 270 capsule 0   gabapentin (NEURONTIN) 100 MG capsule Take 2 capsules (200 mg total) by mouth 3 (three) times daily. 180 capsule 1   lidocaine-prilocaine (EMLA) cream Apply small amount of cream to port site 1-2 hour prior to chemo treatment. 30 g 6   loperamide (IMODIUM) 2 MG capsule TAKE 1 CAP BY MOUTH SEE ADMIN INSTRUCTIONS. INITIAL: 4 MG, FOLLOWED BY 2 MG AFTER EACH LOOSE STOOL MAXIMUM: 16 MG/DAY 60 capsule 0   loratadine (CLARITIN) 10 MG tablet Take 1 tablet (10 mg total) by mouth See admin instructions. Take 1 tablet daily for 4 days after each chemotherapy treatments. 90 tablet 0   meclizine (ANTIVERT) 12.5 MG tablet Take 1 tablet (12.5 mg total) by mouth 3 (three) times daily as needed for dizziness. 30 tablet 0   methocarbamol (ROBAXIN) 500 MG tablet TAKE 1 TABLET TWICE DAILY AS NEEDED FOR MUSCLE SPASM(S) 180 tablet 0   ondansetron (ZOFRAN) 8 MG tablet Take 1 tablet (8 mg total) by mouth 2 (two) times daily as needed (Nausea or vomiting). 30 tablet 1   polyethylene glycol powder (GLYCOLAX/MIRALAX) 17 GM/SCOOP powder Take 17 g by mouth daily. 3350 g 1   potassium chloride SA (KLOR-CON M) 20 MEQ tablet Take 2 tablets (40 mEq total) by mouth daily. 90 tablet 0   sucralfate (CARAFATE) 1 GM/10ML suspension Take 10 mLs (1 g total) by mouth 4 (four) times daily -  with meals and at bedtime. 420 mL 0   triamterene-hydrochlorothiazide (MAXZIDE-25) 37.5-25 MG tablet  TAKE 1 TABLET EVERY DAY 90 tablet 0   No current facility-administered medications for this visit.   Facility-Administered Medications Ordered in Other Visits  Medication Dose Route Frequency Provider Last Rate Last Admin   dextrose 5 % solution   Intravenous Continuous Chelsea Server, MD   Stopped at 08/24/22 1453   fluorouracil (ADRUCIL) 3,950 mg in sodium chloride 0.9  % 71 mL chemo infusion  2,400 mg/m2 (Order-Specific) Intravenous 1 day or 1 dose Chelsea Server, MD   Infusion Verify at 08/24/22 1504   heparin lock flush 100 UNIT/ML injection            palonosetron (ALOXI) 0.25 MG/5ML injection            prochlorperazine (COMPAZINE) 10 MG tablet              PHYSICAL EXAMINATION: ECOG PERFORMANCE STATUS: 2 - Symptomatic, <50% confined to bed Vitals:   08/24/22 1001  BP: (!) 118/95  Pulse: 91  Resp: 18  Temp: 98.5 F (36.9 C)    Filed Weights   08/24/22 1001  Weight: 150 lb 8 oz (68.3 kg)      Physical Exam Constitutional:      General: She is not in acute distress.    Comments: Patient sits in wheelchair  HENT:     Head: Normocephalic and atraumatic.  Eyes:     General: No scleral icterus. Cardiovascular:     Rate and Rhythm: Normal rate and regular rhythm.     Heart sounds: Normal heart sounds.  Pulmonary:     Effort: Pulmonary effort is normal. No respiratory distress.     Breath sounds: No wheezing.  Abdominal:     General: Bowel sounds are normal. There is no distension.     Palpations: Abdomen is soft.  Musculoskeletal:        General: No deformity. Normal range of motion.     Cervical back: Normal range of motion and neck supple.  Skin:    General: Skin is warm and dry.     Findings: No erythema or rash.  Neurological:     Mental Status: She is alert and oriented to person, place, and time. Mental status is at baseline.     Cranial Nerves: No cranial nerve deficit.     Coordination: Coordination normal.  Psychiatric:        Mood and Affect: Mood normal.     LABORATORY DATA:  I have reviewed the data as listed     Latest Ref Rng & Units 08/24/2022    9:11 AM 08/10/2022    8:52 AM 07/29/2022    8:21 AM  CBC  WBC 4.0 - 10.5 K/uL 11.4  3.6  8.3   Hemoglobin 12.0 - 15.0 g/dL 10.1  10.3  10.5   Hematocrit 36.0 - 46.0 % 32.7  32.7  33.2   Platelets 150 - 400 K/uL 169  158  147       Latest Ref Rng & Units  08/24/2022    9:11 AM 08/10/2022    8:52 AM 07/29/2022    8:21 AM  CMP  Glucose 70 - 99 mg/dL 101  104  106   BUN 8 - 23 mg/dL _0 Creatinine 0.44 - 1.00 mg/dL 0.79  0.70  0.79   Sodium 135 - 145 mmol/L 137  141  139   Potassium 3.5 - 5.1 mmol/L 3.1  3.3  3.4   Chloride 98 - 111 mmol/L 105  106  105   CO2 22 - 32 mmol/L _0 Calcium 8.9 - 10.3 mg/dL 8.7  9.2  9.2   Total Protein 6.5 - 8.1 g/dL 7.0  6.9  7.3   Total Bilirubin 0.3 - 1.2 mg/dL 0.4  0.6  0.4   Alkaline Phos 38 - 126 U/L 104  60  90   AST 15 - 41 U/L _1 ALT 0 - 44 U/L _2 RADIOGRAPHIC STUDIES: I have personally reviewed the radiological images as listed and agreed with the findings in the report. MM DIAG BREAST TOMO UNI LEFT  Result Date: 07/31/2022 CLINICAL DATA:  The lung 76 year old female recalled from screening mammogram dated 07/09/2022 for a possible left breast mass. EXAM: DIGITAL DIAGNOSTIC UNILATERAL LEFT MAMMOGRAM WITH TOMOSYNTHESIS; ULTRASOUND LEFT BREAST LIMITED TECHNIQUE: Left digital diagnostic mammography and breast tomosynthesis was performed.; Targeted ultrasound examination of the left breast was performed. COMPARISON:  Previous exam(s). ACR Breast Density Category b: There are scattered areas of fibroglandular density. FINDINGS: There is a persistent oval, circumscribed equal density mass in the upper outer left breast at mid depth. Note is made of associated coarse calcifications. Targeted ultrasound is performed, showing an oval, circumscribed hypoechoic mass with internal calcifications at the 2 o'clock position 10 cm from the nipple. It measures 8 x 4 x 7 mm without significant internal vascularity. This correlates well with the mammographic finding. 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 Recommend short-term follow-up. RECOMMENDATION: Left breast ultrasound in 6 months. I have discussed the findings and  recommendations with the patient. If applicable, a reminder letter will be sent to the patient regarding the next appointment. BI-RADS CATEGORY  3: Probably benign. Electronically Signed   By: Kristopher Oppenheim M.D.   On: 07/31/2022 12:06  US BREAST LTD UNI LEFT INC AXILLA  Result Date: 07/31/2022 CLINICAL DATA:  The lung 76 year old female recalled from screening mammogram dated 07/09/2022 for a possible left breast mass. EXAM: DIGITAL DIAGNOSTIC UNILATERAL LEFT MAMMOGRAM WITH TOMOSYNTHESIS; ULTRASOUND LEFT BREAST LIMITED TECHNIQUE: Left digital diagnostic mammography and breast tomosynthesis was performed.; Targeted ultrasound examination of the left breast was performed. COMPARISON:  Previous exam(s). ACR Breast Density Category b: There are scattered areas of fibroglandular density. FINDINGS: There is a persistent oval, circumscribed equal density mass in the upper outer left breast at mid depth. Note is made of associated coarse calcifications. Targeted ultrasound is performed, showing an oval, circumscribed hypoechoic mass with internal calcifications at the 2 o'clock position 10 cm from the nipple. It measures 8 x 4 x 7 mm without significant internal vascularity. This correlates well with the mammographic finding. 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 Recommend short-term follow-up. RECOMMENDATION: Left breast ultrasound in 6 months. I have discussed the findings and recommendations with the patient. If applicable, a reminder letter will be sent to the patient regarding the next appointment. BI-RADS CATEGORY  3: Probably benign. Electronically Signed   By: Kristopher Oppenheim M.D.   On: 07/31/2022 12:06  DG Bone Density  Result Date: 07/13/2022 EXAM: DUAL X-RAY ABSORPTIOMETRY (DXA) FOR BONE MINERAL DENSITY IMPRESSION: Your patient Chelsea [name] completed a BMD test on 07/09/2022 using the [employer] (software version: 14.10) manufactured  by [employer]. The following summarizes the results of our evaluation. Technologist:VLM PATIENT BIOGRAPHICAL: Name: Chelsea [name], Chelsea [name] Patient ID: [id number] Birth  Date: 26-Nov-1945 Height: 57.0 in. Gender: Female Exam Date: 07/09/2022 Weight: 148.0 lbs. Indications: History of kidney disease, Hysterectomy, Postmenopausal Fractures: Treatments: Protonix DENSITOMETRY RESULTS: Site         Region     Measured Date Measured Age WHO Classification Young Adult T-score BMD         %Change vs. Previous Significant Change (*) AP Spine L2-L4 6/09/[age] 77.1 Normal 3.8 1.683 g/cm2 - - DualFemur Neck Left 07/09/2021 77.1 Normal 0.8 1.144 g/cm2 - - Left Forearm Radius 33% 07[age]09/2021 77.1 Normal 1.6 1.020 g/cm2 - - ASSESSMENT: The BMD measured at Femur Neck Left is 1.144 g/cm2 with a T-score of 0.8. This patient is considered normal according to [facility]) criteria. L-1 was excluded due to degenerative changes. The scan quality is good. [employer] [facility]) criteria for post-menopausal, Caucasian Women: Normal:                   T-score at or above -1 SD Osteopenia/low bone mass: T-score between -1 and -2.5 SD Osteoporosis:             T-score at or below -2.5 SD RECOMMENDATIONS: 1. All patients should optimize calcium and vitamin D intake. 2. Consider FDA-approved medical therapies in postmenopausal women and men aged 25 years and older, based on the following: a. A hip or vertebral(clinical or morphometric) fracture b. T-score < -2.5 at the femoral neck or spine after appropriate evaluation to exclude secondary causes c. Low bone mass (T-score between -1.0 and -2.5 at the femoral neck or spine) and a 10-year probability of a hip fracture > 3% or a 10-year probability of a major osteoporosis-related fracture > 20% based on the [place]-adapted WHO algorithm 3. Clinician judgment and/or patient preferences may indicate treatment for people with 10-year fracture probabilities above or below  these levels FOLLOW-UP: People with diagnosed cases of osteoporosis or at high risk for fracture should have regular bone mineral density tests. For patients eligible for Medicare, routine testing is allowed once every 2 years. The testing frequency can be increased to one year for patients who have rapidly progressing disease, those who are receiving or discontinuing medical therapy to restore bone mass, or have additional risk factors. I have reviewed this report, and agree with the above findings. [facility] Electronically Signed   By: Zerita Boers M.D.   On: 07/13/2022 07:57   MM 3D SCREEN BREAST BILATERAL  Result Date: 07/10/2022 CLINICAL DATA:  Screening. EXAM: DIGITAL SCREENING BILATERAL MAMMOGRAM WITH TOMOSYNTHESIS AND CAD TECHNIQUE: Bilateral screening digital craniocaudal and mediolateral oblique mammograms were obtained. Bilateral screening digital breast tomosynthesis was performed. The images were evaluated with computer-aided detection. COMPARISON:  Previous exam(s). ACR Breast Density Category b: There are scattered areas of fibroglandular density. FINDINGS: In the left breast, a possible mass warrants further evaluation. In the right breast, no findings suspicious for malignancy. IMPRESSION: Further evaluation is suggested for a possible mass in the left breast. RECOMMENDATION: Diagnostic mammogram and possibly ultrasound of the left breast. (Code:[id number]) The patient will be contacted regarding the findings, and additional imaging will be scheduled. BI-RADS CATEGORY  0: Incomplete. Need additional imaging evaluation and/or prior mammograms for comparison. Electronically Signed   By: Kristopher Oppenheim M.D.   On: 07/10/2022 13:15   CT CHEST W CONTRAST  Result Date: 06/25/2022 CLINICAL DATA:  Aspiration. EXAM: CT CHEST WITH CONTRAST TECHNIQUE: Multidetector CT imaging of the chest was performed during intravenous contrast administration. RADIATION DOSE REDUCTION: This exam was  performed according to the departmental dose-optimization program which includes automated exposure control, adjustment of the mA and/or kV according to patient size and/or use of iterative reconstruction technique. CONTRAST:  73m OMNIPAQUE IOHEXOL 300 MG/ML  SOLN COMPARISON:  CT of the chest abdomen pelvis dated 04/13/2022. FINDINGS: Cardiovascular: There is no cardiomegaly or pericardial effusion. Mild atherosclerotic calcification of the thoracic aorta. No aneurysmal dilatation or dissection. The origins of the great vessels of the aortic arch and the central pulmonary arteries are patent. Left-sided Port-A-Cath with tip in the right atrium close to the cavoatrial junction. Mediastinum/Nodes: No hilar or mediastinal adenopathy. Calcified granuloma. The esophagus is grossly unremarkable. No mediastinal fluid collection. Lungs/Pleura: Patchy areas of airspace opacity in the right upper lobe, progressed since the prior radiograph may represent worsening or recurrent pneumonia. Clinical correlation and follow-up to resolution recommended. Partially calcified right middle lobe nodule, likely granuloma. Patchy areas of ground-glass density involving the left lower lobe and lingula similar or slightly progressed since the prior radiograph and may represent worsening infiltrate. Aspiration is not excluded. No pleural effusion or pneumothorax. The central airways are patent. Upper Abdomen: Probable gallbladder sludge or small stones. Fatty liver with a possible changes of cirrhosis. Small bilateral renal calculi. Areas of cortical irregularity and scarring of the kidneys. Musculoskeletal: Osteopenia with degenerative changes of the spine. 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 No acute osseous pathology. IMPRESSION: 1. Patchy areas of airspace opacity in the right upper lobe and left lower lobe and lingula slightly progressed since the prior radiograph and may  represent worsening or recurrent pneumonia. Clinical correlation and follow-up to resolution recommended. 2. Aortic Atherosclerosis (ICD10-I70.0). Electronically Signed   By: AAnner CreteM.D.   On: 06/25/2022 18:06   CT SOFT TISSUE NECK W CONTRAST  Result Date: 06/25/2022 CLINICAL DATA:  Swallowing difficulties, palate weakness (CN 9) EXAM: CT NECK WITH CONTRAST TECHNIQUE: Multidetector CT imaging of the neck was performed using the standard protocol following the bolus administration of intravenous contrast. RADIATION DOSE REDUCTION: This exam was performed according to the departmental dose-optimization program which includes automated exposure control, adjustment of the mA and/or kV according to patient size and/or use of iterative reconstruction technique. CONTRAST:  730mOMNIPAQUE IOHEXOL 300 MG/ML  SOLN COMPARISON:  None Available. FINDINGS: Pharynx and larynx: Normal. No mass or swelling. Salivary glands: No inflammation, mass, or stone. Thyroid: Normal. Lymph nodes: None enlarged or abnormal density. Vascular: Patent. Limited intracranial: Negative. Visualized orbits: Negative. Mastoids and visualized paranasal sinuses: Clear. Skeleton: No acute osseous abnormality. Posterior fusion and decompression C3-C6. Upper chest: Please see same-day CT chest. Other: None. IMPRESSION: 1. No acute process in the neck to explain the patient's difficulty swallowing. 2.  For findings in the thorax, please see same day CT chest. Electronically Signed   By: AlMerilyn Baba.D.   On: 06/25/2022 18:02   CT ABDOMEN PELVIS W CONTRAST  Result Date: 06/24/2022 CLINICAL DATA:  Pain right lower quadrant, pelvic pain, vomiting, metastatic colon carcinoma EXAM: CT ABDOMEN AND PELVIS WITH CONTRAST TECHNIQUE: Multidetector CT imaging of the abdomen and pelvis was performed using the standard protocol following bolus administration of intravenous contrast. RADIATION DOSE REDUCTION: This exam was performed according to the  departmental dose-optimization program which includes automated exposure control, adjustment of the mA and/or kV according to patient size and/or use of iterative reconstruction technique. CONTRAST:  10066mMNIPAQUE IOHEXOL 300 MG/ML  SOLN COMPARISON:  Previous studies including the examination of 04/13/2022 FINDINGS: Lower  chest: Small patchy infiltrates are seen in lower lung fields, more so in the left lower lobe. There is calcified granuloma in left lower lung field. Hepatobiliary: There are a few low-density lesions in liver largest measuring 2 cm with no significant interval change. There is fatty infiltration in liver. Gallbladder is distended. There is no wall thickening. There is no dilation of bile ducts. Pancreas: No focal abnormalities are seen. Spleen: Unremarkable. Adrenals/Urinary Tract: Adrenals are unremarkable. There is no hydronephrosis. 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 There is 2 mm calculus in the right kidney. There is 2 mm calculus in the left kidney. Urinary bladder is distended. Diverticulum is noted in the left posterolateral wall of the bladder. Stomach/Bowel: Stomach is not distended. Small bowel loops are not dilated. Appendix is not seen. There is no pericecal inflammation. Wall thickening in cecum appears less prominent. In the current study, maximum wall thickness in the medial aspect of the cecum measures 12 mm. Few scattered diverticula are seen in colon without signs of focal acute diverticulitis. Vascular/Lymphatic: Scattered arterial calcifications are seen. Reproductive: Uterus is not seen. There is 5.5 x 4.2 cm inhomogeneous soft tissue mass and fluid density in vaginal cuff with no significant interval change. Small amount of fluid is seen in the vaginal canal. Other: There is no ascites or pneumoperitoneum. Small umbilical hernia containing fat is seen. Musculoskeletal: There is minimal anterolisthesis at L4-L5 level. There  is severe spinal stenosis and encroachment of neural foramina at L4-L5 level. There is encroachment of neural foramina at L5-S1 level, more so on the right side. There is partial fusion of bodies of L5 and S1 vertebrae. IMPRESSION: There are low-density lesions in liver consistent with metastatic disease with no significant change. Wall thickening in the cecum appears less prominent. There is 5.5 x 4.2 cm mixed density lesion in vaginal cuff suggesting possible metastatic disease. There is no evidence of intestinal obstruction or pneumoperitoneum. There is no hydronephrosis. Small patchy infiltrates are seen in the lower lung fields suggesting atelectasis/pneumonia. Small bilateral renal stones. Diverticulosis of colon without signs of diverticulitis. 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 There is encroachment of neural foramina at L4-L5 and L5-S1 levels. Other findings as described in the body of the report. Electronically Signed   By: Elmer Picker M.D.   On: 06/24/2022 14:27   DG Chest 2 View  Result Date: 06/24/2022 CLINICAL DATA:  Dysphagia. EXAM: CHEST - 2 VIEW COMPARISON:  Chest x-ray 06/17/2021. FINDINGS: Similar mild enlargement the cardiac silhouette. Left IJ approach Port-A-Cath with the tip projecting at the superior right atrium. No consolidation. No visible pleural effusions or pneumothorax. Partially imaged ACDF. Polyarticular degenerative change. 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 IMPRESSION: No evidence of acute cardiopulmonary disease. Electronically Signed   By: Margaretha Sheffield M.D.   On: 06/24/2022 12:18

## 2022-08-24 NOTE — Patient Instructions (Signed)
[facility] AT [place]  Discharge Instructions: Thank you for choosing [facility] to provide your oncology and hematology care.  If you have a lab appointment with the [facility], please go directly to the [facility] and check in at the registration area.  Wear comfortable clothing and clothing appropriate for easy access to any Portacath or PICC line.   We strive to give you quality time with your provider. You may need to reschedule your appointment if you arrive late (15 or more minutes).  Arriving late affects you and other patients whose appointments are after yours.  Also, if you miss three or more appointments without notifying the office, you may be dismissed from the clinic at the provider's discretion.      For prescription refill requests, have your pharmacy contact our office and allow 72 hours for refills to be completed.    Today you received the following chemotherapy and/or immunotherapy agents LEUCOVORIN, OXALIPLATIN, ZIRABEV, 5FU, POTASSIUM      To help prevent nausea and vomiting after your treatment, we encourage you to take your nausea medication as directed.  BELOW ARE SYMPTOMS THAT SHOULD BE REPORTED IMMEDIATELY: *FEVER GREATER THAN 100.4 F (38 C) OR HIGHER *CHILLS OR SWEATING *NAUSEA AND VOMITING THAT IS NOT CONTROLLED WITH YOUR NAUSEA MEDICATION *UNUSUAL SHORTNESS OF BREATH *UNUSUAL BRUISING OR BLEEDING *URINARY PROBLEMS (pain or burning when urinating, or frequent urination) *BOWEL PROBLEMS (unusual diarrhea, constipation, pain near the anus) TENDERNESS IN MOUTH AND THROAT WITH OR WITHOUT PRESENCE OF ULCERS (sore throat, sores in mouth, or a toothache) UNUSUAL RASH, SWELLING OR PAIN  UNUSUAL VAGINAL DISCHARGE OR ITCHING   Items with * indicate a potential emergency and should be followed up as soon as possible or go to the Emergency Department if any problems should occur.  Please show the CHEMOTHERAPY ALERT CARD or  IMMUNOTHERAPY ALERT CARD at check-in to the Emergency Department and triage nurse.  Should you have questions after your visit or need to cancel or reschedule your appointment, please contact [facility] [place] [facility] [place]  [phone] and follow the prompts.  Office hours are 8:00 a.m. to 4:30 p.m. Monday - Friday. Please note that voicemails left after 4:00 p.m. may not be returned until the following business day.  We are closed weekends and major holidays. You have access to a nurse at all times for urgent questions. Please call the main number to the clinic [phone] and follow the prompts.  For any non-urgent questions, you may also contact your provider using MyChart. We now offer e-Visits for anyone 72 and older to request care online for non-urgent symptoms. For details visit [email or web address].   Also download the MyChart app! Go to the app store, search "MyChart", open the app, select [facility], and log in with your MyChart username and password.  Masks are optional in the cancer centers. If you would like for your care team to wear a mask while they are taking care of you, please let them know. For doctor visits, patients may have with them one support person who is at least 77 years old. At this time, visitors are not allowed in the infusion area.  Leucovorin Injection What is this medication? LEUCOVORIN (loo koe VOR in) prevents side effects from certain medications, such as methotrexate. It works by increasing folate levels. This helps protect healthy cells in your body. It may also be used to treat anemia caused by low levels of folate. It can also be  used with fluorouracil, a type of chemotherapy, to treat colorectal cancer. It works by increasing the effects of fluorouracil in the body. This medicine may be used for other purposes; ask your health care provider or pharmacist if you have questions. What should I tell my care team before I take this  medication? They need to know if you have any of these conditions: Anemia from low levels of vitamin B12 in the blood An unusual or allergic reaction to leucovorin, folic acid, other medications, foods, dyes, or preservatives Pregnant or trying to get pregnant Breastfeeding How should I use this medication? This medication is injected into a vein or a muscle. It is given by your care team in a hospital or clinic setting. Talk to your care team about the use of this medication in children. Special care may be needed. Overdosage: If you think you have taken too much of this medicine contact a poison control center or emergency room at once. NOTE: This medicine is only for you. Do not share this medicine with others. What if I miss a dose? Keep appointments for follow-up doses. It is important not to miss your dose. Call your care team if you are unable to keep an appointment. What may interact with this medication? Capecitabine Fluorouracil Phenobarbital Phenytoin Primidone Trimethoprim;sulfamethoxazole This list may not describe all possible interactions. Give your health care provider a list of all the medicines, herbs, non-prescription drugs, or dietary supplements you use. Also tell them if you smoke, drink alcohol, or use illegal drugs. Some items may interact with your medicine. What should I watch for while using this medication? Your condition will be monitored carefully while you are receiving this medication. This medication may increase the side effects of 5-fluorouracil. Tell your care team if you have diarrhea or mouth sores that do not get better or that get worse. What side effects may I notice from receiving this medication? Side effects that you should report to your care team as soon as possible: Allergic reactions--skin rash, itching, hives, swelling of the face, lips, tongue, or throat This list may not describe all possible side effects. Call your doctor for medical  advice about side effects. You may report side effects to FDA at 1-800-FDA-1088. Where should I keep my medication? This medication is given in a hospital or clinic. It will not be stored at home. NOTE: This sheet is a summary. It may not cover all possible information. If you have questions about this medicine, talk to your doctor, pharmacist, or health care provider.  2023 Elsevier/Gold Standard (2022-02-24 00:00:00)  Oxaliplatin Injection What is this medication? OXALIPLATIN (ox AL i PLA tin) treats some types of cancer. It works by slowing down the growth of cancer cells. This medicine may be used for other purposes; ask your health care provider or pharmacist if you have questions. COMMON BRAND NAME(S): Eloxatin What should I tell my care team before I take this medication? They need to know if you have any of these conditions: Heart disease History of irregular heartbeat or rhythm Liver disease Low blood cell levels (white cells, red cells, and platelets) Lung or breathing disease, such as asthma Take medications that treat or prevent blood clots Tingling of the fingers, toes, or other nerve disorder An unusual or allergic reaction to oxaliplatin, other medications, foods, dyes, or preservatives If you or your partner are pregnant or trying to get pregnant Breast-feeding How should I use this medication? This medication is injected into a vein. It is  given by your care team in a hospital or clinic setting. Talk to your care team about the use of this medication in children. Special care may be needed. Overdosage: If you think you have taken too much of this medicine contact a poison control center or emergency room at once. NOTE: This medicine is only for you. Do not share this medicine with others. What if I miss a dose? Keep appointments for follow-up doses. It is important not to miss a dose. Call your care team if you are unable to keep an appointment. What may interact with  this medication? Do not take this medication with any of the following: Cisapride Dronedarone Pimozide Thioridazine This medication may also interact with the following: Aspirin and aspirin-like medications Certain medications that treat or prevent blood clots, such as warfarin, apixaban, dabigatran, and rivaroxaban Cisplatin Cyclosporine Diuretics Medications for infection, such as acyclovir, adefovir, amphotericin B, bacitracin, cidofovir, foscarnet, ganciclovir, gentamicin, pentamidine, vancomycin NSAIDs, medications for pain and inflammation, such as ibuprofen or naproxen Other medications that cause heart rhythm changes Pamidronate Zoledronic acid This list may not describe all possible interactions. Give your health care provider a list of all the medicines, herbs, non-prescription drugs, or dietary supplements you use. Also tell them if you smoke, drink alcohol, or use illegal drugs. Some items may interact with your medicine. What should I watch for while using this medication? Your condition will be monitored carefully while you are receiving this medication. You may need blood work while taking this medication. This medication may make you feel generally unwell. This is not uncommon as chemotherapy can affect healthy cells as well as cancer cells. Report any side effects. Continue your course of treatment even though you feel ill unless your care team tells you to stop. This medication may increase your risk of getting an infection. Call your care team for advice if you get a fever, chills, sore throat, or other symptoms of a cold or flu. Do not treat yourself. Try to avoid being around people who are sick. Avoid taking medications that contain aspirin, acetaminophen, ibuprofen, naproxen, or ketoprofen unless instructed by your care team. These medications may hide a fever. Be careful brushing or flossing your teeth or using a toothpick because you may get an infection or bleed more  easily. If you have any dental work done, tell your dentist you are receiving this medication. This medication can make you more sensitive to cold. Do not drink cold drinks or use ice. Cover exposed skin before coming in contact with cold temperatures or cold objects. When out in cold weather wear warm clothing and cover your mouth and nose to warm the air that goes into your lungs. Tell your care team if you get sensitive to the cold. Talk to your care team if you or your partner are pregnant or think either of you might be pregnant. This medication can cause serious birth defects if taken during pregnancy and for 9 months after the last dose. A negative pregnancy test is required before starting this medication. A reliable form of contraception is recommended while taking this medication and for 9 months after the last dose. Talk to your care team about effective forms of contraception. Do not father a child while taking this medication and for 6 months after the last dose. Use a condom while having sex during this time period. Do not breastfeed while taking this medication and for 3 months after the last dose. This medication may cause infertility. Talk  to your care team if you are concerned about your fertility. What side effects may I notice from receiving this medication? Side effects that you should report to your care team as soon as possible: Allergic reactions--skin rash, itching, hives, swelling of the face, lips, tongue, or throat Bleeding--bloody or black, tar-like stools, vomiting blood or brown material that looks like coffee grounds, red or dark brown urine, small red or purple spots on skin, unusual bruising or bleeding Dry cough, shortness of breath or trouble breathing Heart rhythm changes--fast or irregular heartbeat, dizziness, feeling faint or lightheaded, chest pain, trouble breathing Infection--fever, chills, cough, sore throat, wounds that don't heal, pain or trouble when passing  urine, general feeling of discomfort or being unwell Liver injury--right upper belly pain, loss of appetite, nausea, light-colored stool, dark yellow or brown urine, yellowing skin or eyes, unusual weakness or fatigue Low red blood cell level--unusual weakness or fatigue, dizziness, headache, trouble breathing Muscle injury--unusual weakness or fatigue, muscle pain, dark yellow or brown urine, decrease in amount of urine Pain, tingling, or numbness in the hands or feet Sudden and severe headache, confusion, change in vision, seizures, which may be signs of posterior reversible encephalopathy syndrome (PRES) Unusual bruising or bleeding Side effects that usually do not require medical attention (report to your care team if they continue or are bothersome): Diarrhea Nausea Pain, redness, or swelling with sores inside the mouth or throat Unusual weakness or fatigue Vomiting This list may not describe all possible side effects. Call your doctor for medical advice about side effects. You may report side effects to FDA at 1-800-FDA-1088. Where should I keep my medication? This medication is given in a hospital or clinic. It will not be stored at home. NOTE: This sheet is a summary. It may not cover all possible information. If you have questions about this medicine, talk to your doctor, pharmacist, or health care provider.  2023 Elsevier/Gold Standard (2007-11-12 00:00:00)  Bevacizumab Injection What is this medication? BEVACIZUMAB (be va SIZ yoo mab) treats some types of cancer. It works by blocking a protein that causes cancer cells to grow and multiply. This helps to slow or stop the spread of cancer cells. It is a monoclonal antibody. This medicine may be used for other purposes; ask your health care provider or pharmacist if you have questions. COMMON BRAND NAME(S): Alymsys, Avastin, MVASI, Noah Charon What should I tell my care team before I take this medication? They need to know if you have  any of these conditions: Blood clots Coughing up blood Having or recent surgery Heart failure High blood pressure History of a connection between 2 or more body parts that do not usually connect (fistula) History of a tear in your stomach or intestines Protein in your urine An unusual or allergic reaction to bevacizumab, other medications, foods, dyes, or preservatives Pregnant or trying to get pregnant Breast-feeding How should I use this medication? This medication is injected into a vein. It is given by your care team in a hospital or clinic setting. Talk to your care team the use of this medication in children. Special care may be needed. Overdosage: If you think you have taken too much of this medicine contact a poison control center or emergency room at once. NOTE: This medicine is only for you. Do not share this medicine with others. What if I miss a dose? Keep appointments for follow-up doses. It is important not to miss your dose. Call your care team if you are unable to  keep an appointment. What may interact with this medication? Interactions are not expected. This list may not describe all possible interactions. Give your health care provider a list of all the medicines, herbs, non-prescription drugs, or dietary supplements you use. Also tell them if you smoke, drink alcohol, or use illegal drugs. Some items may interact with your medicine. What should I watch for while using this medication? Your condition will be monitored carefully while you are receiving this medication. You may need blood work while taking this medication. This medication may make you feel generally unwell. This is not uncommon as chemotherapy can affect healthy cells as well as cancer cells. Report any side effects. Continue your course of treatment even though you feel ill unless your care team tells you to stop. This medication may increase your risk to bruise or bleed. Call your care team if you notice  any unusual bleeding. Before having surgery, talk to your care team to make sure it is ok. This medication can increase the risk of poor healing of your surgical site or wound. You will need to stop this medication for 28 days before surgery. After surgery, wait at least 28 days before restarting this medication. Make sure the surgical site or wound is healed enough before restarting this medication. Talk to your care team if questions. Talk to your care team if you may be pregnant. Serious birth defects can occur if you take this medication during pregnancy and for 6 months after the last dose. Contraception is recommended while taking this medication and for 6 months after the last dose. Your care team can help you find the option that works for you. Do not breastfeed while taking this medication and for 6 months after the last dose. This medication can cause infertility. Talk to your care team if you are concerned about your fertility. What side effects may I notice from receiving this medication? Side effects that you should report to your care team as soon as possible: Allergic reactions--skin rash, itching, hives, swelling of the face, lips, tongue, or throat Bleeding--bloody or black, tar-like stools, vomiting blood or brown material that looks like coffee grounds, red or dark brown urine, small red or purple spots on skin, unusual bruising or bleeding Blood clot--pain, swelling, or warmth in the leg, shortness of breath, chest pain Heart attack--pain or tightness in the chest, shoulders, arms, or jaw, nausea, shortness of breath, cold or clammy skin, feeling faint or lightheaded Heart failure--shortness of breath, swelling of the ankles, feet, or hands, sudden weight gain, unusual weakness or fatigue Increase in blood pressure Infection--fever, chills, cough, sore throat, wounds that don't heal, pain or trouble when passing urine, general feeling of discomfort or being unwell Infusion  reactions--chest pain, shortness of breath or trouble breathing, feeling faint or lightheaded Kidney injury--decrease in the amount of urine, swelling of the ankles, hands, or feet Stomach pain that is severe, does not go away, or gets worse Stroke--sudden numbness or weakness of the face, arm, or leg, trouble speaking, confusion, trouble walking, loss of balance or coordination, dizziness, severe headache, change in vision Sudden and severe headache, confusion, change in vision, seizures, which may be signs of posterior reversible encephalopathy syndrome (PRES) Side effects that usually do not require medical attention (report to your care team if they continue or are bothersome): Back pain Change in taste Diarrhea Dry skin Increased tears Nosebleed This list may not describe all possible side effects. Call your doctor for medical advice about side effects.  You may report side effects to FDA at 1-800-FDA-1088. Where should I keep my medication? This medication is given in a hospital or clinic. It will not be stored at home. NOTE: This sheet is a summary. It may not cover all possible information. If you have questions about this medicine, talk to your doctor, pharmacist, or health care provider.  2023 Elsevier/Gold Standard (2022-01-23 00:00:00)  Fluorouracil Injection What is this medication? FLUOROURACIL (flure oh YOOR a sil) treats some types of cancer. It works by slowing down the growth of cancer cells. This medicine may be used for other purposes; ask your health care provider or pharmacist if you have questions. COMMON BRAND NAME(S): Adrucil What should I tell my care team before I take this medication? They need to know if you have any of these conditions: Blood disorders Dihydropyrimidine dehydrogenase (DPD) deficiency Infection, such as chickenpox, cold sores, herpes Kidney disease Liver disease Poor nutrition Recent or ongoing radiation therapy An unusual or allergic  reaction to fluorouracil, other medications, foods, dyes, or preservatives If you or your partner are pregnant or trying to get pregnant Breast-feeding How should I use this medication? This medication is injected into a vein. It is administered by your care team in a hospital or clinic setting. Talk to your care team about the use of this medication in children. Special care may be needed. Overdosage: If you think you have taken too much of this medicine contact a poison control center or emergency room at once. NOTE: This medicine is only for you. Do not share this medicine with others. What if I miss a dose? Keep appointments for follow-up doses. It is important not to miss your dose. Call your care team if you are unable to keep an appointment. What may interact with this medication? Do not take this medication with any of the following: Live virus vaccines This medication may also interact with the following: Medications that treat or prevent blood clots, such as warfarin, enoxaparin, dalteparin This list may not describe all possible interactions. Give your health care provider a list of all the medicines, herbs, non-prescription drugs, or dietary supplements you use. Also tell them if you smoke, drink alcohol, or use illegal drugs. Some items may interact with your medicine. What should I watch for while using this medication? Your condition will be monitored carefully while you are receiving this medication. This medication may make you feel generally unwell. This is not uncommon as chemotherapy can affect healthy cells as well as cancer cells. Report any side effects. Continue your course of treatment even though you feel ill unless your care team tells you to stop. In some cases, you may be given additional medications to help with side effects. Follow all directions for their use. This medication may increase your risk of getting an infection. Call your care team for advice if you get  a fever, chills, sore throat, or other symptoms of a cold or flu. Do not treat yourself. Try to avoid being around people who are sick. This medication may increase your risk to bruise or bleed. Call your care team if you notice any unusual bleeding. Be careful brushing or flossing your teeth or using a toothpick because you may get an infection or bleed more easily. If you have any dental work done, tell your dentist you are receiving this medication. Avoid taking medications that contain aspirin, acetaminophen, ibuprofen, naproxen, or ketoprofen unless instructed by your care team. These medications may hide a fever. Do not  treat diarrhea with over the counter products. Contact your care team if you have diarrhea that lasts more than 2 days or if it is severe and watery. This medication can make you more sensitive to the sun. Keep out of the sun. If you cannot avoid being in the sun, wear protective clothing and sunscreen. Do not use sun lamps, tanning beds, or tanning booths. Talk to your care team if you or your partner wish to become pregnant or think you might be pregnant. This medication can cause serious birth defects if taken during pregnancy and for 3 months after the last dose. A reliable form of contraception is recommended while taking this medication and for 3 months after the last dose. Talk to your care team about effective forms of contraception. Do not father a child while taking this medication and for 3 months after the last dose. Use a condom while having sex during this time period. Do not breastfeed while taking this medication. This medication may cause infertility. Talk to your care team if you are concerned about your fertility. What side effects may I notice from receiving this medication? Side effects that you should report to your care team as soon as possible: Allergic reactions--skin rash, itching, hives, swelling of the face, lips, tongue, or throat Heart attack--pain or  tightness in the chest, shoulders, arms, or jaw, nausea, shortness of breath, cold or clammy skin, feeling faint or lightheaded Heart failure--shortness of breath, swelling of the ankles, feet, or hands, sudden weight gain, unusual weakness or fatigue Heart rhythm changes--fast or irregular heartbeat, dizziness, feeling faint or lightheaded, chest pain, trouble breathing High ammonia level--unusual weakness or fatigue, confusion, loss of appetite, nausea, vomiting, seizures Infection--fever, chills, cough, sore throat, wounds that don't heal, pain or trouble when passing urine, general feeling of discomfort or being unwell Low red blood cell level--unusual weakness or fatigue, dizziness, headache, trouble breathing Pain, tingling, or numbness in the hands or feet, muscle weakness, change in vision, confusion or trouble speaking, loss of balance or coordination, trouble walking, seizures Redness, swelling, and blistering of the skin over hands and feet Severe or prolonged diarrhea Unusual bruising or bleeding Side effects that usually do not require medical attention (report to your care team if they continue or are bothersome): Dry skin Headache Increased tears Nausea Pain, redness, or swelling with sores inside the mouth or throat Sensitivity to light Vomiting This list may not describe all possible side effects. Call your doctor for medical advice about side effects. You may report side effects to FDA at 1-800-FDA-1088. Where should I keep my medication? This medication is given in a hospital or clinic. It will not be stored at home. NOTE: This sheet is a summary. It may not cover all possible information. If you have questions about this medicine, talk to your doctor, pharmacist, or health care provider.  2023 Elsevier/Gold Standard (2022-01-20 00:00:00)   Potassium Chloride Injection What is this medication? POTASSIUM CHLORIDE (poe TASS i um KLOOR ide) prevents and treats low levels  of potassium in your body. Potassium plays an important role in maintaining the health of your kidneys, heart, muscles, and nervous system. This medicine may be used for other purposes; ask your health care provider or pharmacist if you have questions. COMMON BRAND NAME(S): PROAMP What should I tell my care team before I take this medication? They need to know if you have any of these conditions: Addison disease Dehydration Diabetes (high blood sugar) Heart disease High levels of potassium  in the blood Irregular heartbeat or rhythm Kidney disease Large areas of burned skin An unusual or allergic reaction to potassium, other medications, foods, dyes, or preservatives Pregnant or trying to get pregnant Breast-feeding How should I use this medication? This medication is injected into a vein. It is given in a hospital or clinic setting. Talk to your care team about the use of this medication in children. Special care may be needed. Overdosage: If you think you have taken too much of this medicine contact a poison control center or emergency room at once. NOTE: This medicine is only for you. Do not share this medicine with others. What if I miss a dose? This does not apply. This medication is not for regular use. What may interact with this medication? Do not take this medication with any of the following: Certain diuretics, such as spironolactone, triamterene Eplerenone Sodium polystyrene sulfonate This medication may also interact with the following: Certain medications for blood pressure or heart disease, such as lisinopril, losartan, quinapril, valsartan Medications that lower your chance of fighting infection, such as cyclosporine, tacrolimus NSAIDs, medications for pain and inflammation, such as ibuprofen or naproxen Other potassium supplements Salt substitutes This list may not describe all possible interactions. Give your health care provider a list of all the medicines, herbs,  non-prescription drugs, or dietary supplements you use. Also tell them if you smoke, drink alcohol, or use illegal drugs. Some items may interact with your medicine. What should I watch for while using this medication? Visit your care team for regular checks on your progress. Tell your care team if your symptoms do not start to get better or if they get worse. You may need blood work while you are taking this medication. Avoid salt substitutes unless you are told otherwise by your care team. What side effects may I notice from receiving this medication? Side effects that you should report to your care team as soon as possible: Allergic reactions--skin rash, itching, hives, swelling of the face, lips, tongue, or throat High potassium level--muscle weakness, fast or irregular heartbeat Side effects that usually do not require medical attention (report to your care team if they continue or are bothersome): Diarrhea Nausea Stomach pain Vomiting This list may not describe all possible side effects. Call your doctor for medical advice about side effects. You may report side effects to FDA at 1-800-FDA-1088. Where should I keep my medication? This medication is given in a hospital or clinic. It will not be stored at home. NOTE: This sheet is a summary. It may not cover all possible information. If you have questions about this medicine, talk to your doctor, pharmacist, or health care provider.  2023 Elsevier/Gold Standard (2021-01-02 00:00:00)

## 2022-08-24 NOTE — Progress Notes (Signed)
Pt here for follow up. Pt reports no new problems.

## 2022-08-24 NOTE — Assessment & Plan Note (Signed)
Hemoglobin is stable.  Continue monitor.   

## 2022-08-24 NOTE — Assessment & Plan Note (Signed)
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  Bilateral fingertips, pre-existing right side neuropathy is slightly worse. Left side is intermittent.  gabapentin to 200 mg 3 times daily.  Oxaliplatin has been dose reduced to '65mg'$ /m2. Consider switching to Irinotecan if neuropathy further exacerbated. Dicussed with patient and daughter.  Patient prefers to stay on Oxaliplatin as she feels neuropathy is within her acceptable level.

## 2022-08-24 NOTE — Assessment & Plan Note (Signed)
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  No sufficient tissue for NGS Liquid biopsy showed KRAS12V, APC, TP53 mutation, TMB 4.  S/p 6 cycles of 5-FU /Bevacizumab--> CT showed mixed response--> FOLFOX/bevacizumab.-->CT imaging shows stable disease.  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  Stable disease.

## 2022-08-24 NOTE — Assessment & Plan Note (Signed)
patient gets prophylactic G-CSF with Udenyca on day 3.  Claritin 10 mg daily for 4 days.

## 2022-08-25 LAB — CEA: CEA: 2.9 ng/mL (ref 0.0–4.7)

## 2022-08-26 ENCOUNTER — Inpatient Hospital Stay: Payer: Medicare HMO

## 2022-08-26 VITALS — BP 133/64 | HR 95 | Temp 99.0°F

## 2022-08-26 DIAGNOSIS — Z5111 Encounter for antineoplastic chemotherapy: Secondary | ICD-10-CM | POA: Diagnosis not present

## 2022-08-26 DIAGNOSIS — D6481 Anemia due to antineoplastic chemotherapy: Secondary | ICD-10-CM | POA: Diagnosis not present

## 2022-08-26 DIAGNOSIS — C787 Secondary malignant neoplasm of liver and intrahepatic bile duct: Secondary | ICD-10-CM | POA: Diagnosis not present

## 2022-08-26 DIAGNOSIS — Z79899 Other long term (current) drug therapy: Secondary | ICD-10-CM | POA: Diagnosis not present

## 2022-08-26 DIAGNOSIS — C180 Malignant neoplasm of cecum: Secondary | ICD-10-CM | POA: Diagnosis not present

## 2022-08-26 DIAGNOSIS — Z5112 Encounter for antineoplastic immunotherapy: Secondary | ICD-10-CM | POA: Diagnosis not present

## 2022-08-26 DIAGNOSIS — C189 Malignant neoplasm of colon, unspecified: Secondary | ICD-10-CM

## 2022-08-26 MED ORDER — HEPARIN SOD (PORK) LOCK FLUSH 100 UNIT/ML IV SOLN
500.0000 [IU] | Freq: Once | INTRAVENOUS | Status: AC | PRN
  Administered 2022-08-26: 500 [IU]
  Filled 2022-08-26: qty 5

## 2022-08-26 MED ORDER — SODIUM CHLORIDE 0.9% FLUSH
10.0000 mL | INTRAVENOUS | Status: DC | PRN
  Administered 2022-08-26: 10 mL
  Filled 2022-08-26: qty 10

## 2022-08-26 MED ORDER — PEGFILGRASTIM-CBQV 6 MG/0.6ML ~~LOC~~ SOSY
6.0000 mg | PREFILLED_SYRINGE | Freq: Once | SUBCUTANEOUS | Status: AC
  Administered 2022-08-26: 6 mg via SUBCUTANEOUS
  Filled 2022-08-26: qty 0.6

## 2022-08-28 ENCOUNTER — Encounter: Payer: Self-pay | Admitting: Oncology

## 2022-09-01 ENCOUNTER — Telehealth: Payer: Self-pay | Admitting: Oncology

## 2022-09-01 NOTE — Telephone Encounter (Signed)
Called pt to notifiy her of ct appt, unable to LVM, Mail reminder to be sent

## 2022-09-07 ENCOUNTER — Encounter: Payer: Self-pay | Admitting: Oncology

## 2022-09-07 ENCOUNTER — Inpatient Hospital Stay (HOSPITAL_BASED_OUTPATIENT_CLINIC_OR_DEPARTMENT_OTHER): Payer: Medicare HMO | Admitting: Oncology

## 2022-09-07 ENCOUNTER — Inpatient Hospital Stay: Payer: Medicare HMO | Attending: Oncology

## 2022-09-07 ENCOUNTER — Inpatient Hospital Stay: Payer: Medicare HMO

## 2022-09-07 VITALS — BP 119/94 | HR 89 | Temp 96.3°F | Resp 18 | Wt 145.0 lb

## 2022-09-07 DIAGNOSIS — Z5111 Encounter for antineoplastic chemotherapy: Secondary | ICD-10-CM | POA: Insufficient documentation

## 2022-09-07 DIAGNOSIS — C180 Malignant neoplasm of cecum: Secondary | ICD-10-CM | POA: Insufficient documentation

## 2022-09-07 DIAGNOSIS — M542 Cervicalgia: Secondary | ICD-10-CM

## 2022-09-07 DIAGNOSIS — G629 Polyneuropathy, unspecified: Secondary | ICD-10-CM | POA: Diagnosis not present

## 2022-09-07 DIAGNOSIS — D6481 Anemia due to antineoplastic chemotherapy: Secondary | ICD-10-CM | POA: Insufficient documentation

## 2022-09-07 DIAGNOSIS — Z79899 Other long term (current) drug therapy: Secondary | ICD-10-CM | POA: Insufficient documentation

## 2022-09-07 DIAGNOSIS — C787 Secondary malignant neoplasm of liver and intrahepatic bile duct: Secondary | ICD-10-CM

## 2022-09-07 DIAGNOSIS — C189 Malignant neoplasm of colon, unspecified: Secondary | ICD-10-CM

## 2022-09-07 DIAGNOSIS — Z5112 Encounter for antineoplastic immunotherapy: Secondary | ICD-10-CM | POA: Insufficient documentation

## 2022-09-07 DIAGNOSIS — T451X5A Adverse effect of antineoplastic and immunosuppressive drugs, initial encounter: Secondary | ICD-10-CM

## 2022-09-07 DIAGNOSIS — R634 Abnormal weight loss: Secondary | ICD-10-CM | POA: Diagnosis not present

## 2022-09-07 LAB — COMPREHENSIVE METABOLIC PANEL
ALT: 11 U/L (ref 0–44)
AST: 25 U/L (ref 15–41)
Albumin: 3.2 g/dL — ABNORMAL LOW (ref 3.5–5.0)
Alkaline Phosphatase: 122 U/L (ref 38–126)
Anion gap: 8 (ref 5–15)
BUN: 11 mg/dL (ref 8–23)
CO2: 28 mmol/L (ref 22–32)
Calcium: 9 mg/dL (ref 8.9–10.3)
Chloride: 105 mmol/L (ref 98–111)
Creatinine, Ser: 0.8 mg/dL (ref 0.44–1.00)
GFR, Estimated: >60 mL/min (ref >60)
Glucose, Bld: 141 mg/dL — ABNORMAL HIGH (ref 70–99)
Potassium: 3.3 mmol/L — ABNORMAL LOW (ref 3.5–5.1)
Sodium: 141 mmol/L (ref 135–145)
Total Bilirubin: 0.5 mg/dL (ref 0.3–1.2)
Total Protein: 6.8 g/dL (ref 6.5–8.1)

## 2022-09-07 LAB — CBC WITH DIFFERENTIAL/PLATELET
Abs Immature Granulocytes: 0.22 10*3/uL — ABNORMAL HIGH (ref 0.00–0.07)
Basophils Absolute: 0.1 10*3/uL (ref 0.0–0.1)
Basophils Relative: 1 %
Eosinophils Absolute: 0.1 10*3/uL (ref 0.0–0.5)
Eosinophils Relative: 0 %
HCT: 32.9 % — ABNORMAL LOW (ref 36.0–46.0)
Hemoglobin: 10.3 g/dL — ABNORMAL LOW (ref 12.0–15.0)
Immature Granulocytes: 2 %
Lymphocytes Relative: 20 %
Lymphs Abs: 2.4 10*3/uL (ref 0.7–4.0)
MCH: 32.6 pg (ref 26.0–34.0)
MCHC: 31.3 g/dL (ref 30.0–36.0)
MCV: 104.1 fL — ABNORMAL HIGH (ref 80.0–100.0)
Monocytes Absolute: 1 10*3/uL (ref 0.1–1.0)
Monocytes Relative: 8 %
Neutro Abs: 8.3 10*3/uL — ABNORMAL HIGH (ref 1.7–7.7)
Neutrophils Relative %: 69 %
Platelets: 144 10*3/uL — ABNORMAL LOW (ref 150–400)
RBC: 3.16 MIL/uL — ABNORMAL LOW (ref 3.87–5.11)
RDW: 18.3 % — ABNORMAL HIGH (ref 11.5–15.5)
WBC: 12 10*3/uL — ABNORMAL HIGH (ref 4.0–10.5)
nRBC: 0.9 % — ABNORMAL HIGH (ref 0.0–0.2)

## 2022-09-07 LAB — PROTEIN, URINE, RANDOM: Total Protein, Urine: 10 mg/dL

## 2022-09-07 MED ORDER — PROCHLORPERAZINE MALEATE 10 MG PO TABS
10.0000 mg | ORAL_TABLET | Freq: Once | ORAL | Status: AC
  Administered 2022-09-07: 10 mg via ORAL
  Filled 2022-09-07: qty 1

## 2022-09-07 MED ORDER — LEUCOVORIN CALCIUM INJECTION 350 MG
400.0000 mg/m2 | Freq: Once | INTRAVENOUS | Status: AC
  Administered 2022-09-07: 660 mg via INTRAVENOUS
  Filled 2022-09-07: qty 33

## 2022-09-07 MED ORDER — ACETAMINOPHEN 325 MG PO TABS
650.0000 mg | ORAL_TABLET | Freq: Once | ORAL | Status: AC
  Administered 2022-09-07: 650 mg via ORAL
  Filled 2022-09-07: qty 2

## 2022-09-07 MED ORDER — SODIUM CHLORIDE 0.9 % IV SOLN
5.0000 mg/kg | Freq: Once | INTRAVENOUS | Status: AC
  Administered 2022-09-07: 350 mg via INTRAVENOUS
  Filled 2022-09-07: qty 14

## 2022-09-07 MED ORDER — SODIUM CHLORIDE 0.9 % IV SOLN
2400.0000 mg/m2 | INTRAVENOUS | Status: DC
  Administered 2022-09-07: 3950 mg via INTRAVENOUS
  Filled 2022-09-07: qty 79

## 2022-09-07 MED ORDER — PALONOSETRON HCL INJECTION 0.25 MG/5ML: 0.2500 mg | Freq: Once | INTRAVENOUS | Status: DC

## 2022-09-07 MED ORDER — SODIUM CHLORIDE 0.9 % IV SOLN
Freq: Once | INTRAVENOUS | Status: AC
  Filled 2022-09-07: qty 250

## 2022-09-07 NOTE — Progress Notes (Signed)
Pt here for follow up. Pt reports decreased appetite, numbness to fingers and diarrhea

## 2022-09-07 NOTE — Assessment & Plan Note (Signed)
We discussed about appetite stimulant Marinol.  Patient would like to defer for now and try nutrition supplements breast.  Recommend patient follow-up with nutritionist

## 2022-09-07 NOTE — Assessment & Plan Note (Signed)
Hemoglobin is stable.  Continue monitor.   

## 2022-09-07 NOTE — Assessment & Plan Note (Signed)
Chemotherapy plan as listed above 

## 2022-09-07 NOTE — Progress Notes (Signed)
Hematology/Oncology Progress note [phone] [phone]         Patient Care Team: Jearld Fenton, NP as PCP - General (Internal Medicine) Clent Jacks, RN as Oncology Nurse Navigator [name], MD as Consulting Physician (Oncology)  ASSESSMENT & PLAN:   Cancer Staging  Metastatic colon cancer to liver [facility]) Staging form: Colon and Rectum, AJCC 8th Edition - Clinical stage from 09/04/2021: Stage Unknown (cTX, cNX, cM1) - Signed by [name], MD on 05/13/2022   Metastatic colon cancer to liver [facility]) #Metastatic colon cancer to liver, 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  No sufficient tissue for NGS Liquid biopsy showed KRAS12V, APC, TP53 mutation, TMB 4.  S/p 6 cycles of 5-FU /Bevacizumab--> CT showed mixed response--> FOLFOX/bevacizumab.-->CT imaging shows stable disease.  Labs reviewed and discussed with patient Proceed with 5-FU/bevacizumab. Discontinue oxaliplatin due to worsening of neuropathy.  Recommend maintenance 5-FU/bevacizumab.  If her disease progresses, consider adding irinotecan if she is able to tolerate.  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  Stable disease.  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  Bilateral fingertips, pre-existing right side neuropathy is slightly worse. Left side is intermittent.  gabapentin to 200 mg 3 times daily.  Oxaliplatin is discontinued today.  Encounter for antineoplastic chemotherapy Chemotherapy plan as listed above  Anemia due to antineoplastic chemotherapy Hemoglobin is stable.  Continue monitor.    Weight loss We discussed about appetite stimulant Marinol.  Patient would like to defer for now and try nutrition supplements breast.  Recommend patient follow-up with nutritionist  No orders of the defined types were placed in this encounter.   Follow-up in  2 weels  lab MD 5-FU/bevacizumab with day 3 pump DC All  questions were answered. The patient knows to call the clinic with any problems, questions or concerns.  [name], MD, PhD [facility] Hematology Oncology 09/07/2022   CHIEF COMPLAINTS/REASON FOR VISIT:  metastatic colon cancer  HISTORY OF PRESENTING ILLNESS:   Chelsea [name] is a  76 y.o.  [name] presents for management of metastatic colon cancer. Oncology History  Metastatic colon cancer to liver ([place])  08/21/2021 Imaging   CT abdomen pelvis with contrast  showed soft tissue mass at the base of cecum measuring up to 5 cm, slightly increased in size.  Interval development of multiple low-density lesions throughout the liver highly suggestive for metastatic disease.  Complex mass in the region of the vaginal cuff measuring up to 5.2 cm, increased in size.  Right ovary also appears more prominent on the current examination.  Moderate large volume of stool throughout the colon.  Mild urinary bladder wall thickening.  Colonic diverticulosis without evidence of active diverticulitis.  Aortic atherosclerosis   09/04/2021 Procedure   patient is status post left lobe liver lesion biopsy and pathology showed metastatic adenocarcinoma, compatible with colorectal primary.   09/04/2021 Cancer Staging   Staging form: Colon and Rectum, AJCC 8th Edition - Clinical stage from 09/04/2021: Stage Unknown (cTX, cNX, cM1) - Signed by [name], MD on 05/13/2022 Stage prefix: Initial diagnosis   09/16/2021 Initial Diagnosis   Metastatic colon cancer to liver [facility]) [facility] one liquid biopsy testing showed KRASG12V, APC TP53 TMB 4  Patient initially went to emergency room on 03/24/2021 for abdominal pain. 03/24/2021, CT scan of the abdomen showed marked bladder wall thickening with surrounding soft tissue stranding is noted.  Concerning for cystitis.  There is a lobulated mass between the posterior wall  of the bladder and the rectum which appears to arise from the vaginal cuff.  This is indeterminate and  difficult to characterize reflecting lack of IV contrast material.  Nonobstructing left renal calculus, left sacroiliitis, lumbar spondylosis aortic atherosclerosis. 03/24/2021 CT pelvic showed abdominal [place] soft tissue of right cecum, concerning for colon carcinoma.  Colonoscopy recommended.  Low-attenuation mass in the region of vaginal cuff is again noted.  Diffuse urinary bladder wall thickening and mild bilateral hydroureter possibly cystitis.   [place] 03/24/2021 Lobulated solid 3.8 cm mass confirmed by ultrasound. This is most compatible with a neoplasm but remains indeterminate as to the origin as the epicenter seems to be outside of both the vaginal cuff and the adjacent colon, while the lesion appears inseparable from both. Extensive echogenic debris within the distended urinary bladder.   03/26/2021, patient was seen by Dr. Theora Gianotti for evaluation of colonic/vaginal cuff pelvic mass.  Patient also was referred to gastroenterology for colonoscopy.   03/26/2021 CEA 3.4.  CA125 9.7 04/04/2021, status post colonoscopy which showed a frond like /villous nonobstructing large mass was found in the cecum, this was biopsied.  Terminal ileum was briefly intubated and appeared normal.  Diverticulosis in the sigmoid colon.  The rectum, sigmoid colon, descending colon, transverse colon and ascending colon were normal.  Nonbleeding internal hemorrhoids. 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 Given that the biopsy may not be representative of the entire underlying lesion.  Patient was recommended to establish with surgeon for evaluation. Patient no showed for her surgery appointment and as well as her follow-up appointment with gastroenterology.  Per daughter, patient was having cervical spine surgery and she prioritized that over the colon surgery.   10/20/2021 - 01/07/2022 Chemotherapy    COLORECTAL 5-FU  + Bevacizumab q14d      10/20/2021 -  Chemotherapy   Patient is on Treatment  Plan : COLORECTAL 5FU + Leucovorin (Modified [place]) + Bevacizumab q14d     12/22/2021 Imaging   CT chest abdomen pelvis Decreased cecum mass, however increased size and size of metastatic lesions throughout the liver.  Interval enlargement of vaginal cuff mass. No mets in chest.    01/21/2022 -  Chemotherapy   COLORECTAL FOLFOX  + Bevacizumab q14d    patient was seen by gynecology oncology Dr. Fransisca Connors.  Biopsy of the vaginal mass was not recommended.Patient's case was also discussed on multidisciplinary tumor board.  IR potentially can try a biopsy if patient is willing to.I had a discussion with patient's daughter over the phone prior to this visit.  We discussed about option of adding oxaliplatin to 5-FU/bevacizumab and continue treatment of colon cancer, repeat CT scan short-term and if progression, repeat biopsy versus proceeding with vaginal mass biopsy.  Daughter prefers proceeding with chemotherapy and defer biopsy.   01/08/2022 patient has had a second opinion at [facility] and was seen by Dr. Reynaldo Minium .  Dr. Reynaldo Minium agrees with the current treatment plan with FOLFOX/bevacizumab.  He has ordered guardant 360 to see if she may be eligible for clinical trial in the future.  -   04/13/2022 Imaging    CT chest abdomen pelvis showed a cecal mass with stable hepatic metastasis.  Mixed cystic and solid mass in the region of the vaginal cuff, new area of patchy groundglass and consolidation in the right upper lobe, lingula and left lower lobe.  Likely due to pneumonia.  Hepatic steatosis.  Bilateral renal stones.  Aortic atherosclerosis   06/24/2022 Imaging   CT abdomen pelvis  with contrast showed There are low-density lesions in liver consistent with metastatic disease with no significant change. Wall thickening in the cecum appears less prominent. There is 5.5 x 4.2 cm mixed density lesion in vaginal cuff suggesting possible metastatic disease.   There is no evidence of intestinal obstruction or  pneumoperitoneum.There is no hydronephrosis.   Small patchy infiltrates are seen in the lower lung fields suggesting atelectasis/pneumonia.   Small bilateral renal stones. Diverticulosis of colon without signs of diverticulitis. 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 There is encroachment of neural foramina at L4-L5 and L5-S1 levels.     06/25/2022 Imaging   CT soft tissue neck with contrast showed1No acute process in the neck to explain the patient's difficulty swallowing.  CT chest contrast showed 1. Patchy areas of airspace opacity in the right upper lobe and left lower lobe and lingula slightly progressed since the prior radiograph and may represent worsening or recurrent pneumonia. Clinical correlation and follow-up to resolution recommended. 2. Aortic Atherosclerosis     06/24/2022 - 06/26/2022 patient was hospitalized due to difficulty swallowing, 1 day of abdominal pain.  Patient had extensive work-up including EGD which showed erosive gastritis.  No significant esophageal or gastric abnormality.  CT scan showed possible aspiration induced pneumonitis.  Antibiotics was held.  Today patient denies any fever, chills,.  Occasionally she has cough.  There is plan of possible relocation to [place] area next year.   INTERVAL HISTORY Chelsea [name] is a 76 y.o. [name] who has above history reviewed by me today presents for follow up visit for management of metastatic colon cancer Patient was accompanied by daughter + fatigue,  + She has decreased appetite due to decreased taste.  Decreased taste, "sandy taste'.  \ Lost weight since last visit. No nausea vomiting diarrhea today.  She has had 2 episodes of diarrhea during the interval.  Symptoms resolved Right side pre-existing neuropathy, worse. On gabapentin.  Left hand finger tip neuropathy intermittent.   Review of Systems  Constitutional:  Positive for fatigue. Negative for appetite change, chills,  fever and unexpected weight change.  HENT:   Negative for hearing loss and voice change.   Eyes:  Negative for eye problems.  Respiratory:  Negative for chest tightness and cough.   Cardiovascular:  Negative for chest pain.  Gastrointestinal:  Negative for abdominal distention, abdominal pain, blood in stool and diarrhea.  Endocrine: Negative for hot flashes.  Genitourinary:  Negative for difficulty urinating and frequency.   Musculoskeletal:  Positive for back pain. Negative for arthralgias.  Skin:  Negative for itching and rash.  Neurological:  Positive for numbness (chornic finger tips). Negative for extremity weakness.  Hematological:  Negative for adenopathy.  Psychiatric/Behavioral:  Negative for confusion.     MEDICAL HISTORY:  Past Medical History:  Diagnosis Date   Anxiety    Arthritis    Depression    Hyperlipidemia    Hypertension    Liver mass    Metastatic colon cancer to liver ([place])    Moderate mitral insufficiency    Port-A-Cath in place    Seizures [facility])    Spinal stenosis    Ulcer    Urinary incontinence     SURGICAL HISTORY: Past Surgical History:  Procedure Laterality Date   ABDOMINAL HYSTERECTOMY     BACK SURGERY     due to polio   BREAST BIOPSY Bilateral    neg   BREAST BIOPSY Right 2011   neg/stereo   CARPAL TUNNEL  RELEASE     CATARACT EXTRACTION Right 2020   COLONOSCOPY WITH PROPOFOL N/A 04/04/2021   Procedure: COLONOSCOPY WITH PROPOFOL;  Surgeon: Virgel Manifold, MD;  Location: [facility] ENDOSCOPY;  Service: Endoscopy;  Laterality: N/A;   ESOPHAGOGASTRODUODENOSCOPY (EGD) WITH PROPOFOL N/A 06/25/2022   Procedure: ESOPHAGOGASTRODUODENOSCOPY (EGD) WITH PROPOFOL;  Surgeon: Lin Landsman, MD;  Location: [facility] ENDOSCOPY;  Service: Gastroenterology;  Laterality: N/A;   EYE SURGERY     IR IMAGING GUIDED PORT INSERTION  10/09/2021    SOCIAL HISTORY: Social History   Socioeconomic History   Marital status: Divorced    Spouse name: Not on file    Number of children: Not on file   Years of education: Not on file   Highest education level: Not on file  Occupational History   Not on file  Tobacco Use   Smoking status: Never   Smokeless tobacco: Never  Vaping Use   Vaping Use: Never used  Substance and Sexual Activity   Alcohol use: Never   Drug use: No   Sexual activity: Not Currently  Other Topics Concern   Not on file  Social History Narrative   Not on file   Social Determinants of Health   Financial Resource Strain: Low Risk  (01/12/2022)   Overall Financial Resource Strain (CARDIA)    Difficulty of Paying Living Expenses: Not very hard  Food Insecurity: No Food Insecurity (06/24/2022)   Hunger Vital Sign    Worried About Running Out of Food in the Last Year: Never true    Ran Out of Food in the Last Year: Never true  Transportation Needs: No Transportation Needs (06/24/2022)   PRAPARE - Hydrologist (Medical): No    Lack of Transportation (Non-Medical): No  Physical Activity: Inactive (01/12/2022)   Exercise Vital Sign    Days of Exercise per Week: 0 days    Minutes of Exercise per Session: 0 min  Stress: Stress Concern Present (01/12/2022)   [place]    Feeling of Stress : To some extent  Social Connections: Moderately Integrated (01/12/2022)   Social Connection and Isolation Panel [NHANES]    Frequency of Communication with Friends and Family: Three times a week    Frequency of Social Gatherings with Friends and Family: Three times a week    Attends Religious Services: 1 to 4 times per year    Active Member of Clubs or Organizations: No    Attends Archivist Meetings: 1 to 4 times per year    Marital Status: Widowed  Intimate Partner Violence: Not At Risk (06/24/2022)   Humiliation, Afraid, Rape, and Kick questionnaire    Fear of Current or Ex-Partner: No    Emotionally Abused: No    Physically Abused: No     Sexually Abused: No    FAMILY HISTORY: Family History  Problem Relation Age of Onset   Arthritis Mother    Heart disease Mother    Stroke Mother    Hypertension Mother    COPD Mother    Sudden death Sister    Other Father        unknown medical history   Breast cancer Neg Hx     ALLERGIES:  is allergic to penicillins.  MEDICATIONS:  Current Outpatient Medications  Medication Sig Dispense Refill   acetaminophen (TYLENOL) 500 MG tablet Take 500 mg by mouth every 6 (six) hours as needed.     ALPRAZolam (XANAX) 0.5  MG tablet TAKE 1 TABLET BY MOUTH TWICE A DAY AS NEEDED FOR ANXIETY 10.11.23 60 tablet 0   atorvastatin (LIPITOR) 10 MG tablet TAKE 1 TABLET BY MOUTH EVERY DAY 90 tablet 0   busPIRone (BUSPAR) 5 MG tablet Take 1 tablet (5 mg total) by mouth 3 (three) times daily. 270 tablet 1   Cyanocobalamin (VITAMIN B 12 PO) Take 500 mcg by mouth daily.     diclofenac Sodium (VOLTAREN) 1 % GEL Apply 4 g topically 4 (four) times daily. 4 g 0   docusate sodium (COLACE) 100 MG capsule Take 1 capsule (100 mg total) by mouth 2 (two) times daily. 60 capsule 0   FLUoxetine (PROZAC) 20 MG capsule TAKE 3 CAPSULES (60 MG TOTAL) BY MOUTH EVERY MORNING. 270 capsule 0   gabapentin (NEURONTIN) 100 MG capsule Take 2 capsules (200 mg total) by mouth 3 (three) times daily. 180 capsule 1   lidocaine-prilocaine (EMLA) cream Apply small amount of cream to port site 1-2 hour prior to chemo treatment. 30 g 6   loperamide (IMODIUM) 2 MG capsule TAKE 1 CAP BY MOUTH SEE ADMIN INSTRUCTIONS. INITIAL: 4 MG, FOLLOWED BY 2 MG AFTER EACH LOOSE STOOL MAXIMUM: 16 MG/DAY 60 capsule 0   loratadine (CLARITIN) 10 MG tablet Take 1 tablet (10 mg total) by mouth See admin instructions. Take 1 tablet daily for 4 days after each chemotherapy treatments. 90 tablet 0   meclizine (ANTIVERT) 12.5 MG tablet Take 1 tablet (12.5 mg total) by mouth 3 (three) times daily as needed for dizziness. 30 tablet 0   methocarbamol (ROBAXIN) 500 MG  tablet TAKE 1 TABLET TWICE DAILY AS NEEDED FOR MUSCLE SPASM(S) 180 tablet 0   ondansetron (ZOFRAN) 8 MG tablet Take 1 tablet (8 mg total) by mouth 2 (two) times daily as needed (Nausea or vomiting). 30 tablet 1   polyethylene glycol powder (GLYCOLAX/MIRALAX) 17 GM/SCOOP powder Take 17 g by mouth daily. 3350 g 1   potassium chloride SA (KLOR-CON M) 20 MEQ tablet Take 2 tablets (40 mEq total) by mouth daily. 90 tablet 0   sucralfate (CARAFATE) 1 GM/10ML suspension Take 10 mLs (1 g total) by mouth 4 (four) times daily -  with meals and at bedtime. 420 mL 0   triamterene-hydrochlorothiazide (MAXZIDE-25) 37.5-25 MG tablet TAKE 1 TABLET EVERY DAY 90 tablet 0   No current facility-administered medications for this visit.   Facility-Administered Medications Ordered in Other Visits  Medication Dose Route Frequency Provider Last Rate Last Admin   fluorouracil (ADRUCIL) 3,950 mg in sodium chloride 0.9 % 71 mL chemo infusion  2,400 mg/m2 (Order-Specific) Intravenous 1 day or 1 dose [name], MD   3,950 mg at 09/07/22 1125   heparin lock flush 100 UNIT/ML injection            palonosetron (ALOXI) 0.25 MG/5ML injection            prochlorperazine (COMPAZINE) 10 MG tablet              PHYSICAL EXAMINATION: ECOG PERFORMANCE STATUS: 2 - Symptomatic, <50% confined to bed Vitals:   09/07/22 0916  BP: (!) 119/94  Pulse: 89  Resp: 18  Temp: (!) 96.3 F (35.7 C)    Filed Weights   09/07/22 0916  Weight: 145 lb (65.8 kg)      Physical Exam Constitutional:      General: She is not in acute distress.    Comments: Patient sits in wheelchair  HENT:     Head: Normocephalic  and atraumatic.  Eyes:     General: No scleral icterus. Cardiovascular:     Rate and Rhythm: Normal rate and regular rhythm.     Heart sounds: Normal heart sounds.  Pulmonary:     Effort: Pulmonary effort is normal. No respiratory distress.     Breath sounds: No wheezing.  Abdominal:     General: Bowel sounds are normal.  There is no distension.     Palpations: Abdomen is soft.  Musculoskeletal:        General: No deformity. Normal range of motion.     Cervical back: Normal range of motion and neck supple.  Skin:    General: Skin is warm and dry.     Findings: No erythema or rash.  Neurological:     Mental Status: She is alert and oriented to person, place, and time. Mental status is at baseline.     Cranial Nerves: No cranial nerve deficit.     Coordination: Coordination normal.  Psychiatric:        Mood and Affect: Mood normal.     LABORATORY DATA:  I have reviewed the data as listed     Latest Ref Rng & Units 09/07/2022    8:57 AM 08/24/2022    9:11 AM 08/10/2022    8:52 AM  CBC  WBC 4.0 - 10.5 K/uL 12.0  11.4  3.6   Hemoglobin 12.0 - 15.0 g/dL 10.3  10.1  10.3   Hematocrit 36.0 - 46.0 % 32.9  32.7  32.7   Platelets 150 - 400 K/uL 144  169  158       Latest Ref Rng & Units 09/07/2022    8:57 AM 08/24/2022    9:11 AM 08/10/2022    8:52 AM  CMP  Glucose 70 - 99 mg/dL 141  101  104   BUN 8 - 23 mg/dL _0 Creatinine 0.44 - 1.00 mg/dL 0.80  0.79  0.70   Sodium 135 - 145 mmol/L 141  137  141   Potassium 3.5 - 5.1 mmol/L 3.3  3.1  3.3   Chloride 98 - 111 mmol/L 105  105  106   CO2 22 - 32 mmol/L _1 Calcium 8.9 - 10.3 mg/dL 9.0  8.7  9.2   Total Protein 6.5 - 8.1 g/dL 6.8  7.0  6.9   Total Bilirubin 0.3 - 1.2 mg/dL 0.5  0.4  0.6   Alkaline Phos 38 - 126 U/L 122  104  60   AST 15 - 41 U/L _2 ALT 0 - 44 U/L _3 RADIOGRAPHIC STUDIES: I have personally reviewed the radiological images as listed and agreed with the findings in the report. MM DIAG BREAST TOMO UNI LEFT  Result Date: 07/31/2022 CLINICAL DATA:  The lung 76 year old [name] recalled from screening mammogram dated 07/09/2022 for a possible left breast mass. EXAM: DIGITAL DIAGNOSTIC UNILATERAL LEFT MAMMOGRAM WITH TOMOSYNTHESIS; ULTRASOUND LEFT BREAST LIMITED TECHNIQUE: Left digital  diagnostic mammography and breast tomosynthesis was performed.; Targeted ultrasound examination of the left breast was performed. COMPARISON:  Previous exam(s). ACR Breast Density Category b: There are scattered areas of fibroglandular density. FINDINGS: There is a persistent oval, circumscribed equal density mass in the upper outer left breast at mid depth. Note is made of associated coarse calcifications. Targeted ultrasound is performed, showing an oval, circumscribed hypoechoic mass with  internal calcifications at the 2 o'clock position 10 cm from the nipple. It measures 8 x 4 x 7 mm without significant internal vascularity. This correlates well with the mammographic finding. 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 Recommend short-term follow-up. RECOMMENDATION: Left breast ultrasound in 6 months. I have discussed the findings and recommendations with the patient. If applicable, a reminder letter will be sent to the patient regarding the next appointment. BI-RADS CATEGORY  3: Probably benign. Electronically Signed   By: Kristopher Oppenheim M.D.   On: 07/31/2022 12:06  US BREAST LTD UNI LEFT INC AXILLA  Result Date: 07/31/2022 CLINICAL DATA:  The lung 76 year old [name] recalled from screening mammogram dated 07/09/2022 for a possible left breast mass. EXAM: DIGITAL DIAGNOSTIC UNILATERAL LEFT MAMMOGRAM WITH TOMOSYNTHESIS; ULTRASOUND LEFT BREAST LIMITED TECHNIQUE: Left digital diagnostic mammography and breast tomosynthesis was performed.; Targeted ultrasound examination of the left breast was performed. COMPARISON:  Previous exam(s). ACR Breast Density Category b: There are scattered areas of fibroglandular density. FINDINGS: There is a persistent oval, circumscribed equal density mass in the upper outer left breast at mid depth. Note is made of associated coarse calcifications. Targeted ultrasound is performed, showing an oval, circumscribed hypoechoic mass  with internal calcifications at the 2 o'clock position 10 cm from the nipple. It measures 8 x 4 x 7 mm without significant internal vascularity. This correlates well with the mammographic finding. 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 Recommend short-term follow-up. RECOMMENDATION: Left breast ultrasound in 6 months. I have discussed the findings and recommendations with the patient. If applicable, a reminder letter will be sent to the patient regarding the next appointment. BI-RADS CATEGORY  3: Probably benign. Electronically Signed   By: Kristopher Oppenheim M.D.   On: 07/31/2022 12:06  DG Bone Density  Result Date: 07/13/2022 EXAM: DUAL X-RAY ABSORPTIOMETRY (DXA) FOR BONE MINERAL DENSITY IMPRESSION: Your patient Nehal Witting completed a BMD test on 07/09/2022 using the [employer] (software version: 14.10) manufactured by [employer]. The following summarizes the results of our evaluation. Technologist:VLM PATIENT BIOGRAPHICAL: Name: Parker, Sawatzky Patient ID: [id number] Birth Date: December 09, 1945 Height: 57.0 in. Gender: [name] Exam Date: 07/09/2022 Weight: 148.0 lbs. Indications: History of kidney disease, Hysterectomy, Postmenopausal Fractures: Treatments: Protonix DENSITOMETRY RESULTS: Site         Region     Measured Date Measured Age WHO Classification Young Adult T-score BMD         %Change vs. Previous Significant Change (*) AP Spine L2-L4 07/09/2021 77.1 Normal 3.8 1.683 g/cm2 - - DualFemur Neck Left 6/09/2021 77.1 Normal 0.8 1.144 g/cm2 - - Left Forearm Radius 33% 6/09/2021 77.1 Normal 1.6 1.020 g/cm2 - - ASSESSMENT: The BMD measured at Femur Neck Left is 1.144 g/cm2 with a T-score of 0.8. This patient is considered normal according to [facility]) criteria. L-1 was excluded due to degenerative changes. The scan quality is good. World Pharmacologist [facility]) criteria for post-menopausal, Caucasian  Women: Normal:                   T-score at or above -1 SD Osteopenia/low bone mass: T-score between -1 and -2.5 SD Osteoporosis:             T-score at or below -2.5 SD RECOMMENDATIONS: 1. All patients should optimize calcium and vitamin D intake. 2. Consider FDA-approved medical therapies in postmenopausal women and men aged 72 years and older, based on  the following: a. A hip or vertebral(clinical or morphometric) fracture b. T-score < -2.5 at the femoral neck or spine after appropriate evaluation to exclude secondary causes c. Low bone mass (T-score between -1.0 and -2.5 at the femoral neck or spine) and a 10-year probability of a hip fracture > 3% or a 10-year probability of a major osteoporosis-related fracture > 20% based on the [place]-adapted WHO algorithm 3. Clinician judgment and/or patient preferences may indicate treatment for people with 10-year fracture probabilities above or below these levels FOLLOW-UP: People with diagnosed cases of osteoporosis or at high risk for fracture should have regular bone mineral density tests. For patients eligible for Medicare, routine testing is allowed once every 2 years. The testing frequency can be increased to one year for patients who have rapidly progressing disease, those who are receiving or discontinuing medical therapy to restore bone mass, or have additional risk factors. I have reviewed this report, and agree with the above findings. [facility] Electronically Signed   By: Zerita Boers M.D.   On: 07/13/2022 07:57   MM 3D SCREEN BREAST BILATERAL  Result Date: 07/10/2022 CLINICAL DATA:  Screening. EXAM: DIGITAL SCREENING BILATERAL MAMMOGRAM WITH TOMOSYNTHESIS AND CAD TECHNIQUE: Bilateral screening digital craniocaudal and mediolateral oblique mammograms were obtained. Bilateral screening digital breast tomosynthesis was performed. The images were evaluated with computer-aided detection. COMPARISON:  Previous exam(s). ACR Breast Density Category  b: There are scattered areas of fibroglandular density. FINDINGS: In the left breast, a possible mass warrants further evaluation. In the right breast, no findings suspicious for malignancy. IMPRESSION: Further evaluation is suggested for a possible mass in the left breast. RECOMMENDATION: Diagnostic mammogram and possibly ultrasound of the left breast. (Code:[id number]) The patient will be contacted regarding the findings, and additional imaging will be scheduled. BI-RADS CATEGORY  0: Incomplete. Need additional imaging evaluation and/or prior mammograms for comparison. Electronically Signed   By: Kristopher Oppenheim M.D.   On: 07/10/2022 13:15   CT CHEST W CONTRAST  Result Date: 06/25/2022 CLINICAL DATA:  Aspiration. EXAM: CT CHEST WITH CONTRAST TECHNIQUE: Multidetector CT imaging of the chest was performed during intravenous contrast administration. RADIATION DOSE REDUCTION: This exam was performed according to the departmental dose-optimization program which includes automated exposure control, adjustment of the mA and/or kV according to patient size and/or use of iterative reconstruction technique. CONTRAST:  29m OMNIPAQUE IOHEXOL 300 MG/ML  SOLN COMPARISON:  CT of the chest abdomen pelvis dated 04/13/2022. FINDINGS: Cardiovascular: There is no cardiomegaly or pericardial effusion. Mild atherosclerotic calcification of the thoracic aorta. No aneurysmal dilatation or dissection. The origins of the great vessels of the aortic arch and the central pulmonary arteries are patent. Left-sided Port-A-Cath with tip in the right atrium close to the cavoatrial junction. Mediastinum/Nodes: No hilar or mediastinal adenopathy. Calcified granuloma. The esophagus is grossly unremarkable. No mediastinal fluid collection. Lungs/Pleura: Patchy areas of airspace opacity in the right upper lobe, progressed since the prior radiograph may represent worsening or recurrent pneumonia. Clinical correlation and follow-up to resolution  recommended. Partially calcified right middle lobe nodule, likely granuloma. Patchy areas of ground-glass density involving the left lower lobe and lingula similar or slightly progressed since the prior radiograph and may represent worsening infiltrate. Aspiration is not excluded. No pleural effusion or pneumothorax. The central airways are patent. Upper Abdomen: Probable gallbladder sludge or small stones. Fatty liver with a possible changes of cirrhosis. Small bilateral renal calculi. Areas of cortical irregularity and scarring of the kidneys. Musculoskeletal: Osteopenia with degenerative changes of  the spine. 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 No acute osseous pathology. IMPRESSION: 1. Patchy areas of airspace opacity in the right upper lobe and left lower lobe and lingula slightly progressed since the prior radiograph and may represent worsening or recurrent pneumonia. Clinical correlation and follow-up to resolution recommended. 2. Aortic Atherosclerosis (ICD10-I70.0). Electronically Signed   By: Anner Crete M.D.   On: 06/25/2022 18:06   CT SOFT TISSUE NECK W CONTRAST  Result Date: 06/25/2022 CLINICAL DATA:  Swallowing difficulties, palate weakness (CN 9) EXAM: CT NECK WITH CONTRAST TECHNIQUE: Multidetector CT imaging of the neck was performed using the standard protocol following the bolus administration of intravenous contrast. RADIATION DOSE REDUCTION: This exam was performed according to the departmental dose-optimization program which includes automated exposure control, adjustment of the mA and/or kV according to patient size and/or use of iterative reconstruction technique. CONTRAST:  20m OMNIPAQUE IOHEXOL 300 MG/ML  SOLN COMPARISON:  None Available. FINDINGS: Pharynx and larynx: Normal. No mass or swelling. Salivary glands: No inflammation, mass, or stone. Thyroid: Normal. Lymph nodes: None enlarged or abnormal density. Vascular:  Patent. Limited intracranial: Negative. Visualized orbits: Negative. Mastoids and visualized paranasal sinuses: Clear. Skeleton: No acute osseous abnormality. Posterior fusion and decompression C3-C6. Upper chest: Please see same-day CT chest. Other: None. IMPRESSION: 1. No acute process in the neck to explain the patient's difficulty swallowing. 2.  For findings in the thorax, please see same day CT chest. Electronically Signed   By: AMerilyn BabaM.D.   On: 06/25/2022 18:02   CT ABDOMEN PELVIS W CONTRAST  Result Date: 06/24/2022 CLINICAL DATA:  Pain right lower quadrant, pelvic pain, vomiting, metastatic colon carcinoma EXAM: CT ABDOMEN AND PELVIS WITH CONTRAST TECHNIQUE: Multidetector CT imaging of the abdomen and pelvis was performed using the standard protocol following bolus administration of intravenous contrast. RADIATION DOSE REDUCTION: This exam was performed according to the departmental dose-optimization program which includes automated exposure control, adjustment of the mA and/or kV according to patient size and/or use of iterative reconstruction technique. CONTRAST:  1029mOMNIPAQUE IOHEXOL 300 MG/ML  SOLN COMPARISON:  Previous studies including the examination of 04/13/2022 FINDINGS: Lower chest: Small patchy infiltrates are seen in lower lung fields, more so in the left lower lobe. There is calcified granuloma in left lower lung field. Hepatobiliary: There are a few low-density lesions in liver largest measuring 2 cm with no significant interval change. There is fatty infiltration in liver. Gallbladder is distended. There is no wall thickening. There is no dilation of bile ducts. Pancreas: No focal abnormalities are seen. Spleen: Unremarkable. Adrenals/Urinary Tract: Adrenals are unremarkable. There is no hydronephrosis. 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 There is 2 mm calculus in the right kidney. There is 2 mm calculus in the left kidney.  Urinary bladder is distended. Diverticulum is noted in the left posterolateral wall of the bladder. Stomach/Bowel: Stomach is not distended. Small bowel loops are not dilated. Appendix is not seen. There is no pericecal inflammation. Wall thickening in cecum appears less prominent. In the current study, maximum wall thickness in the medial aspect of the cecum measures 12 mm. Few scattered diverticula are seen in colon without signs of focal acute diverticulitis. Vascular/Lymphatic: Scattered arterial calcifications are seen. Reproductive: Uterus is not seen. There is 5.5 x 4.2 cm inhomogeneous soft tissue mass and fluid density in vaginal cuff with no significant interval change. Small amount of fluid is seen in the vaginal canal. Other:  There is no ascites or pneumoperitoneum. Small umbilical hernia containing fat is seen. Musculoskeletal: There is minimal anterolisthesis at L4-L5 level. There is severe spinal stenosis and encroachment of neural foramina at L4-L5 level. There is encroachment of neural foramina at L5-S1 level, more so on the right side. There is partial fusion of bodies of L5 and S1 vertebrae. IMPRESSION: There are low-density lesions in liver consistent with metastatic disease with no significant change. Wall thickening in the cecum appears less prominent. There is 5.5 x 4.2 cm mixed density lesion in vaginal cuff suggesting possible metastatic disease. There is no evidence of intestinal obstruction or pneumoperitoneum. There is no hydronephrosis. Small patchy infiltrates are seen in the lower lung fields suggesting atelectasis/pneumonia. Small bilateral renal stones. Diverticulosis of colon without signs of diverticulitis. 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 There is encroachment of neural foramina at L4-L5 and L5-S1 levels. Other findings as described in the body of the report. Electronically Signed   By: Elmer Picker M.D.   On: 06/24/2022 14:27   DG Chest 2  View  Result Date: 06/24/2022 CLINICAL DATA:  Dysphagia. EXAM: CHEST - 2 VIEW COMPARISON:  Chest x-ray 06/17/2021. FINDINGS: Similar mild enlargement the cardiac silhouette. Left IJ approach Port-A-Cath with the tip projecting at the superior right atrium. No consolidation. No visible pleural effusions or pneumothorax. Partially imaged ACDF. Polyarticular degenerative change. 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 IMPRESSION: No evidence of acute cardiopulmonary disease. Electronically Signed   By: Margaretha Sheffield M.D.   On: 06/24/2022 12:18

## 2022-09-07 NOTE — Patient Instructions (Signed)
[facility] [place]  Discharge Instructions: Thank you for choosing [facility] to provide your oncology and hematology care.  If you have a lab appointment with the Cancer Center, please go directly to the Cancer Center and check in at the registration area.  Wear comfortable clothing and clothing appropriate for easy access to any Portacath or PICC line.   We strive to give you quality time with your provider. You may need to reschedule your appointment if you arrive late (15 or more minutes).  Arriving late affects you and other patients whose appointments are after yours.  Also, if you miss three or more appointments without notifying the office, you may be dismissed from the clinic at the provider's discretion.      For prescription refill requests, have your pharmacy contact our office and allow 72 hours for refills to be completed.       To help prevent nausea and vomiting after your treatment, we encourage you to take your nausea medication as directed.  BELOW ARE SYMPTOMS THAT SHOULD BE REPORTED IMMEDIATELY: *FEVER GREATER THAN 100.4 F (38 C) OR HIGHER *CHILLS OR SWEATING *NAUSEA AND VOMITING THAT IS NOT CONTROLLED WITH YOUR NAUSEA MEDICATION *UNUSUAL SHORTNESS OF BREATH *UNUSUAL BRUISING OR BLEEDING *URINARY PROBLEMS (pain or burning when urinating, or frequent urination) *BOWEL PROBLEMS (unusual diarrhea, constipation, pain near the anus) TENDERNESS IN MOUTH AND THROAT WITH OR WITHOUT PRESENCE OF ULCERS (sore throat, sores in mouth, or a toothache) UNUSUAL RASH, SWELLING OR PAIN  UNUSUAL VAGINAL DISCHARGE OR ITCHING   Items with * indicate a potential emergency and should be followed up as soon as possible or go to the Emergency Department if any problems should occur.  Please show the CHEMOTHERAPY ALERT CARD or IMMUNOTHERAPY ALERT CARD at check-in to the Emergency Department and triage nurse.  Should you have questions after your  visit or need to cancel or reschedule your appointment, please contact [facility] [place]  [phone] and follow the prompts.  Office hours are 8:00 a.m. to 4:30 p.m. Monday - Friday. Please note that voicemails left after 4:00 p.m. may not be returned until the following business day.  We are closed weekends and major holidays. You have access to a nurse at all times for urgent questions. Please call the main number to the clinic [phone] and follow the prompts.  For any non-urgent questions, you may also contact your provider using MyChart. We now offer e-Visits for anyone 18 and older to request care online for non-urgent symptoms. For details visit [email or web address].   Also download the MyChart app! Go to the app store, search "MyChart", open the app, select , and log in with your MyChart username and password.  Masks are optional in the cancer centers. If you would like for your care team to wear a mask while they are taking care of you, please let them know. For doctor visits, patients may have with them one support person who is at least 77 years old. At this time, visitors are not allowed in the infusion area.   

## 2022-09-07 NOTE — Assessment & Plan Note (Addendum)
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  Bilateral fingertips, pre-existing right side neuropathy is slightly worse. Left side is intermittent.  gabapentin to 200 mg 3 times daily.  Oxaliplatin is discontinued today.

## 2022-09-07 NOTE — Assessment & Plan Note (Addendum)
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  No sufficient tissue for NGS Liquid biopsy showed KRAS12V, APC, TP53 mutation, TMB 4.  S/p 6 cycles of 5-FU /Bevacizumab--> CT showed mixed response--> FOLFOX/bevacizumab.-->CT imaging shows stable disease.  Labs reviewed and discussed with patient Proceed with 5-FU/bevacizumab. Discontinue oxaliplatin due to worsening of neuropathy.  Recommend maintenance 5-FU/bevacizumab.  If her disease progresses, consider adding irinotecan if she is able to tolerate.  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  Stable disease.

## 2022-09-08 LAB — CEA: CEA: 2.9 ng/mL (ref 0.0–4.7)

## 2022-09-09 ENCOUNTER — Inpatient Hospital Stay: Payer: Medicare HMO

## 2022-09-09 DIAGNOSIS — Z5111 Encounter for antineoplastic chemotherapy: Secondary | ICD-10-CM | POA: Diagnosis not present

## 2022-09-09 DIAGNOSIS — C189 Malignant neoplasm of colon, unspecified: Secondary | ICD-10-CM

## 2022-09-09 DIAGNOSIS — C787 Secondary malignant neoplasm of liver and intrahepatic bile duct: Secondary | ICD-10-CM | POA: Diagnosis not present

## 2022-09-09 DIAGNOSIS — C180 Malignant neoplasm of cecum: Secondary | ICD-10-CM | POA: Diagnosis not present

## 2022-09-09 DIAGNOSIS — Z5112 Encounter for antineoplastic immunotherapy: Secondary | ICD-10-CM | POA: Diagnosis not present

## 2022-09-09 DIAGNOSIS — D6481 Anemia due to antineoplastic chemotherapy: Secondary | ICD-10-CM | POA: Diagnosis not present

## 2022-09-09 DIAGNOSIS — Z79899 Other long term (current) drug therapy: Secondary | ICD-10-CM | POA: Diagnosis not present

## 2022-09-09 MED ORDER — HEPARIN SOD (PORK) LOCK FLUSH 100 UNIT/ML IV SOLN
500.0000 [IU] | Freq: Once | INTRAVENOUS | Status: AC | PRN
  Administered 2022-09-09: 500 [IU]
  Filled 2022-09-09: qty 5

## 2022-09-09 MED ORDER — SODIUM CHLORIDE 0.9% FLUSH
10.0000 mL | INTRAVENOUS | Status: DC | PRN
  Administered 2022-09-09: 10 mL
  Filled 2022-09-09: qty 10

## 2022-09-13 ENCOUNTER — Other Ambulatory Visit: Payer: Self-pay | Admitting: Internal Medicine

## 2022-09-14 NOTE — Telephone Encounter (Signed)
Requested Prescriptions  Pending Prescriptions Disp Refills   FLUoxetine (PROZAC) 20 MG capsule [Pharmacy Med Name: FLUOXETINE HCL 20 MG CAPSULE] 270 capsule 0    Sig: TAKE 3 CAPSULES (60 MG TOTAL) BY MOUTH EVERY MORNING.     Psychiatry:  Antidepressants - SSRI Passed - 09/13/2022  8:38 AM      Passed - Completed PHQ-2 or PHQ-9 in the last 360 days      Passed - Valid encounter within last 6 months    Recent Outpatient Visits           2 months ago Erosive gastritis   [facility] [place], Coralie Keens, NP   4 months ago Encounter for general adult medical examination with abnormal findings   [facility] [place], Coralie Keens, NP   6 months ago Right leg weakness   [facility] [place], Coralie Keens, NP   10 months ago Pure hypercholesterolemia   [facility] [place], Coralie Keens, NP   1 year ago 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 [facility])   [facility], Coralie Keens, NP       Future Appointments             In 1 month [place], Coralie Keens, NP [facility], [facility]

## 2022-09-20 ENCOUNTER — Other Ambulatory Visit: Payer: Self-pay | Admitting: Internal Medicine

## 2022-09-21 ENCOUNTER — Encounter: Payer: Self-pay | Admitting: Oncology

## 2022-09-21 ENCOUNTER — Inpatient Hospital Stay: Payer: Medicare HMO

## 2022-09-21 ENCOUNTER — Inpatient Hospital Stay (HOSPITAL_BASED_OUTPATIENT_CLINIC_OR_DEPARTMENT_OTHER): Payer: Medicare HMO | Admitting: Oncology

## 2022-09-21 VITALS — BP 108/69 | HR 92 | Temp 96.8°F | Wt 145.2 lb

## 2022-09-21 DIAGNOSIS — C189 Malignant neoplasm of colon, unspecified: Secondary | ICD-10-CM | POA: Diagnosis not present

## 2022-09-21 DIAGNOSIS — D6481 Anemia due to antineoplastic chemotherapy: Secondary | ICD-10-CM | POA: Diagnosis not present

## 2022-09-21 DIAGNOSIS — C180 Malignant neoplasm of cecum: Secondary | ICD-10-CM | POA: Diagnosis not present

## 2022-09-21 DIAGNOSIS — Z79899 Other long term (current) drug therapy: Secondary | ICD-10-CM | POA: Diagnosis not present

## 2022-09-21 DIAGNOSIS — C787 Secondary malignant neoplasm of liver and intrahepatic bile duct: Secondary | ICD-10-CM | POA: Diagnosis not present

## 2022-09-21 DIAGNOSIS — Z5111 Encounter for antineoplastic chemotherapy: Secondary | ICD-10-CM | POA: Diagnosis not present

## 2022-09-21 DIAGNOSIS — G629 Polyneuropathy, unspecified: Secondary | ICD-10-CM

## 2022-09-21 DIAGNOSIS — T451X5A Adverse effect of antineoplastic and immunosuppressive drugs, initial encounter: Secondary | ICD-10-CM

## 2022-09-21 DIAGNOSIS — Z5112 Encounter for antineoplastic immunotherapy: Secondary | ICD-10-CM | POA: Diagnosis not present

## 2022-09-21 DIAGNOSIS — R634 Abnormal weight loss: Secondary | ICD-10-CM | POA: Diagnosis not present

## 2022-09-21 DIAGNOSIS — E876 Hypokalemia: Secondary | ICD-10-CM

## 2022-09-21 LAB — CBC WITH DIFFERENTIAL/PLATELET
Abs Immature Granulocytes: 0.02 10*3/uL (ref 0.00–0.07)
Basophils Absolute: 0 10*3/uL (ref 0.0–0.1)
Basophils Relative: 1 %
Eosinophils Absolute: 0 10*3/uL (ref 0.0–0.5)
Eosinophils Relative: 1 %
HCT: 30.8 % — ABNORMAL LOW (ref 36.0–46.0)
Hemoglobin: 9.8 g/dL — ABNORMAL LOW (ref 12.0–15.0)
Immature Granulocytes: 1 %
Lymphocytes Relative: 26 %
Lymphs Abs: 1 10*3/uL (ref 0.7–4.0)
MCH: 32.5 pg (ref 26.0–34.0)
MCHC: 31.8 g/dL (ref 30.0–36.0)
MCV: 102 fL — ABNORMAL HIGH (ref 80.0–100.0)
Monocytes Absolute: 0.5 10*3/uL (ref 0.1–1.0)
Monocytes Relative: 13 %
Neutro Abs: 2.4 10*3/uL (ref 1.7–7.7)
Neutrophils Relative %: 58 %
Platelets: 129 10*3/uL — ABNORMAL LOW (ref 150–400)
RBC: 3.02 MIL/uL — ABNORMAL LOW (ref 3.87–5.11)
RDW: 17.1 % — ABNORMAL HIGH (ref 11.5–15.5)
WBC: 4.1 10*3/uL (ref 4.0–10.5)
nRBC: 0 % (ref 0.0–0.2)

## 2022-09-21 LAB — COMPREHENSIVE METABOLIC PANEL
ALT: 9 U/L (ref 0–44)
AST: 22 U/L (ref 15–41)
Albumin: 3.3 g/dL — ABNORMAL LOW (ref 3.5–5.0)
Alkaline Phosphatase: 66 U/L (ref 38–126)
Anion gap: 10 (ref 5–15)
BUN: 16 mg/dL (ref 8–23)
CO2: 26 mmol/L (ref 22–32)
Calcium: 8.9 mg/dL (ref 8.9–10.3)
Chloride: 104 mmol/L (ref 98–111)
Creatinine, Ser: 0.76 mg/dL (ref 0.44–1.00)
GFR, Estimated: >60 mL/min (ref >60)
Glucose, Bld: 127 mg/dL — ABNORMAL HIGH (ref 70–99)
Potassium: 3.1 mmol/L — ABNORMAL LOW (ref 3.5–5.1)
Sodium: 140 mmol/L (ref 135–145)
Total Bilirubin: 0.8 mg/dL (ref 0.3–1.2)
Total Protein: 7.1 g/dL (ref 6.5–8.1)

## 2022-09-21 MED ORDER — SODIUM CHLORIDE 0.9 % IV SOLN
2400.0000 mg/m2 | INTRAVENOUS | Status: DC
  Administered 2022-09-21: 3950 mg via INTRAVENOUS
  Filled 2022-09-21: qty 79

## 2022-09-21 MED ORDER — PROCHLORPERAZINE MALEATE 10 MG PO TABS
10.0000 mg | ORAL_TABLET | Freq: Once | ORAL | Status: AC
  Administered 2022-09-21: 10 mg via ORAL
  Filled 2022-09-21: qty 1

## 2022-09-21 MED ORDER — POTASSIUM CHLORIDE 20 MEQ/100ML IV SOLN
20.0000 meq | Freq: Once | INTRAVENOUS | Status: AC
  Administered 2022-09-21: 20 meq via INTRAVENOUS

## 2022-09-21 MED ORDER — SODIUM CHLORIDE 0.9 % IV SOLN
Freq: Once | INTRAVENOUS | Status: AC
  Filled 2022-09-21: qty 250

## 2022-09-21 MED ORDER — LEUCOVORIN CALCIUM INJECTION 350 MG
400.0000 mg/m2 | Freq: Once | INTRAVENOUS | Status: AC
  Administered 2022-09-21: 660 mg via INTRAVENOUS
  Filled 2022-09-21: qty 33

## 2022-09-21 MED ORDER — SODIUM CHLORIDE 0.9 % IV SOLN
5.0000 mg/kg | Freq: Once | INTRAVENOUS | Status: AC
  Administered 2022-09-21: 350 mg via INTRAVENOUS
  Filled 2022-09-21: qty 14

## 2022-09-21 NOTE — Assessment & Plan Note (Addendum)
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  No sufficient tissue for NGS Liquid biopsy showed KRAS12V, APC, TP53 mutation, TMB 4.  S/p 6 cycles of 5-FU /Bevacizumab--> CT showed mixed response--> FOLFOX/bevacizumab.-->CT imaging shows stable disease --> dc oxaliplatin, 5-FU/Bev maintenance 09/07/22 Labs reviewed and discussed with patient Proceed with 5-FU/bevacizumab. Discontinue oxaliplatin due to worsening of neuropathy.   Continue maintenance 5-FU/bevacizumab.   If her disease progresses, consider adding irinotecan if she is able to tolerate.  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  Stable disease.

## 2022-09-21 NOTE — Patient Instructions (Signed)
[facility] [place]  Discharge Instructions: Thank you for choosing [facility] to provide your oncology and hematology care.  If you have a lab appointment with the [place], please go directly to the [facility] and check in at the registration area.  Wear comfortable clothing and clothing appropriate for easy access to any Portacath or PICC line.   We strive to give you quality time with your provider. You may need to reschedule your appointment if you arrive late (15 or more minutes).  Arriving late affects you and other patients whose appointments are after yours.  Also, if you miss three or more appointments without notifying the office, you may be dismissed from the clinic at the provider's discretion.      For prescription refill requests, have your pharmacy contact our office and allow 72 hours for refills to be completed.    Today you received the following chemotherapy and/or immunotherapy agents Zirabev, Leucovorin, Adrucil      To help prevent nausea and vomiting after your treatment, we encourage you to take your nausea medication as directed.  BELOW ARE SYMPTOMS THAT SHOULD BE REPORTED IMMEDIATELY: *FEVER GREATER THAN 100.4 F (38 C) OR HIGHER *CHILLS OR SWEATING *NAUSEA AND VOMITING THAT IS NOT CONTROLLED WITH YOUR NAUSEA MEDICATION *UNUSUAL SHORTNESS OF BREATH *UNUSUAL BRUISING OR BLEEDING *URINARY PROBLEMS (pain or burning when urinating, or frequent urination) *BOWEL PROBLEMS (unusual diarrhea, constipation, pain near the anus) TENDERNESS IN MOUTH AND THROAT WITH OR WITHOUT PRESENCE OF ULCERS (sore throat, sores in mouth, or a toothache) UNUSUAL RASH, SWELLING OR PAIN  UNUSUAL VAGINAL DISCHARGE OR ITCHING   Items with * indicate a potential emergency and should be followed up as soon as possible or go to the Emergency Department if any problems should occur.  Please show the CHEMOTHERAPY ALERT CARD or IMMUNOTHERAPY ALERT  CARD at check-in to the Emergency Department and triage nurse.  Should you have questions after your visit or need to cancel or reschedule your appointment, please contact [facility] [place] [facility] [place]  [phone] and follow the prompts.  Office hours are 8:00 a.m. to 4:30 p.m. Monday - Friday. Please note that voicemails left after 4:00 p.m. may not be returned until the following business day.  We are closed weekends and major holidays. You have access to a nurse at all times for urgent questions. Please call the main number to the clinic [phone] and follow the prompts.  For any non-urgent questions, you may also contact your provider using MyChart. We now offer e-Visits for anyone 98 and older to request care online for non-urgent symptoms. For details visit [email or web address].   Also download the MyChart app! Go to the app store, search "MyChart", open the app, select [place], and log in with your MyChart username and password.  Masks are optional in the cancer centers. If you would like for your care team to wear a mask while they are taking care of you, please let them know. For doctor visits, patients may have with them one support person who is at least 77 years old. At this time, visitors are not allowed in the infusion area.

## 2022-09-21 NOTE — Assessment & Plan Note (Signed)
We discussed about appetite stimulant Marinol.   Patient would like to defer for now and try nutrition supplements breast.  Continue follow-up with nutritionist 

## 2022-09-21 NOTE — Progress Notes (Signed)
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  No sufficient tissue for NGS Liquid biopsy showed KRAS12V, APC, TP53 mutation, TMB 4.  S/p 6 cycles of 5-FU /Bevacizumab--> CT showed mixed response--> FOLFOX/bevacizumab.-->CT imaging shows stable disease --> dc oxaliplatin, 5-FU/Bev maintenance 09/07/22 Labs reviewed and discussed with patient Proceed with 5-FU/bevacizumab. Discontinue oxaliplatin due to worsening of neuropathy.   Continue maintenance 5-FU/bevacizumab.   If her disease progresses, consider adding irinotecan if she is able to tolerate.  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  Stable disease.  Anemia due to antineoplastic chemotherapy Hemoglobin is stable.  Continue monitor.    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  Bilateral fingertips, pre-existing right side neuropathy is slightly worse. Left side is intermittent.  gabapentin to 200 mg 3 times daily. She declines acupuncture referral.  Oxaliplatin is discontinued   Weight loss We discussed about appetite stimulant Marinol.  Patient would like to defer for now and try nutrition supplements breast.  Continue follow-up with nutritionist  Orders Placed This Encounter  Procedures   Protein, urine, random     Standing Status:   Future    Standing Expiration Date:   11/20/2022   CEA    Standing Status:   Future    Standing Expiration Date:   10/21/2023   CBC with Differential    Standing Status:   Future    Standing Expiration Date:   10/21/2023   Comprehensive metabolic panel    Standing Status:   Future    Standing Expiration Date:   10/21/2023    Follow-up in  2 weels  lab MD 5-FU/bevacizumab with day 3 pump DC All questions were answered. The patient knows to call the clinic with any problems, questions or concerns.  Zhou Yu, MD, PhD [facility] 09/21/2022   CHIEF COMPLAINTS/REASON FOR VISIT:  metastatic colon cancer  HISTORY OF PRESENTING ILLNESS:   Chelsea [name] is a  76 y.o.  female presents for management of metastatic colon cancer. Oncology History  Metastatic colon cancer to liver (HCC)  08/21/2021 Imaging   CT abdomen pelvis with contrast  showed soft tissue mass at the base of cecum measuring up to 5 cm, slightly increased in size.  Interval development of multiple low-density lesions throughout the liver highly suggestive for metastatic disease.  Complex mass in the region of the vaginal cuff measuring up to 5.2 cm, increased in size.  Right ovary also appears more prominent on the current examination.  Moderate large volume of stool throughout the colon.  Mild urinary bladder wall thickening.  Colonic diverticulosis without evidence of active diverticulitis.  Aortic atherosclerosis   09/04/2021 Procedure   patient is status post left lobe liver lesion biopsy and pathology showed metastatic adenocarcinoma, compatible with colorectal primary.   09/04/2021 Cancer Staging   Staging form: Colon and Rectum, AJCC   8th Edition - Clinical stage from 09/04/2021: Stage Unknown (cTX, cNX, cM1) - Signed by [name], MD on 05/13/2022 Stage prefix: Initial diagnosis   09/16/2021 Initial Diagnosis   Metastatic colon cancer to liver [facility] one liquid biopsy  testing showed KRASG12V, APC TP53 TMB 4  Patient initially went to emergency room on 03/24/2021 for abdominal pain. 03/24/2021, CT scan of the abdomen showed marked bladder wall thickening with surrounding soft tissue stranding is noted.  Concerning for cystitis.  There is a lobulated mass between the posterior wall of the bladder and the rectum which appears to arise from the vaginal cuff.  This is indeterminate and difficult to characterize reflecting lack of IV contrast material.  Nonobstructing left renal calculus, left sacroiliitis, lumbar spondylosis aortic atherosclerosis. 03/24/2021 CT pelvic showed abdominal [place] soft tissue of right cecum, concerning for colon carcinoma.  Colonoscopy recommended.  Low-attenuation mass in the region of vaginal cuff is again noted.  Diffuse urinary bladder wall thickening and mild bilateral hydroureter possibly cystitis.   [place] 03/24/2021 Lobulated solid 3.8 cm mass confirmed by ultrasound. This is most compatible with a neoplasm but remains indeterminate as to the origin as the epicenter seems to be outside of both the vaginal cuff and the adjacent colon, while the lesion appears inseparable from both. Extensive echogenic debris within the distended urinary bladder.   03/26/2021, patient was seen by Dr. Theora Gianotti for evaluation of colonic/vaginal cuff pelvic mass.  Patient also was referred to gastroenterology for colonoscopy.   03/26/2021 CEA 3.4.  CA125 9.7 04/04/2021, status post colonoscopy which showed a frond like /villous nonobstructing large mass was found in the cecum, this was biopsied.  Terminal ileum was briefly intubated and appeared normal.  Diverticulosis in the sigmoid colon.  The rectum, sigmoid colon, descending colon, transverse colon and ascending colon were normal.  Nonbleeding internal hemorrhoids. 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 Given that the biopsy may not be representative of the entire underlying lesion.   Patient was recommended to establish with surgeon for evaluation. Patient no showed for her surgery appointment and as well as her follow-up appointment with gastroenterology.  Per daughter, patient was having cervical spine surgery and she prioritized that over the colon surgery.   10/20/2021 - 01/07/2022 Chemotherapy    COLORECTAL 5-FU  + Bevacizumab q14d      10/20/2021 -  Chemotherapy   Patient is on Treatment Plan : COLORECTAL 5FU + Leucovorin (Modified [place]) + Bevacizumab q14d     12/22/2021 Imaging   CT chest abdomen pelvis Decreased cecum mass, however increased size and size of metastatic lesions throughout the liver.  Interval enlargement of vaginal cuff mass. No mets in chest.    01/21/2022 -  Chemotherapy   COLORECTAL FOLFOX  + Bevacizumab q14d    patient was seen by gynecology oncology Dr. Fransisca Connors.  Biopsy of the vaginal mass was not recommended.Patient's case was also discussed on multidisciplinary tumor board.  IR potentially can try a biopsy if patient is willing to.I had a discussion with patient's daughter over the phone prior to this visit.  We discussed about option of adding oxaliplatin to 5-FU/bevacizumab and continue treatment of colon cancer, repeat CT scan short-term and if progression, repeat biopsy versus proceeding with vaginal mass biopsy.  Daughter prefers proceeding with chemotherapy and defer biopsy.   01/08/2022 patient has had a second opinion at [facility] and was seen by Dr. Reynaldo Minium .  Dr. Reynaldo Minium agrees with the current treatment plan with FOLFOX/bevacizumab.  He  has ordered guardant 360 to see if she may be eligible for clinical trial in the future.  -   04/13/2022 Imaging    CT chest abdomen pelvis showed a cecal mass with stable hepatic metastasis.  Mixed cystic and solid mass in the region of the vaginal cuff, new area of patchy groundglass and consolidation in the right upper lobe, lingula and left lower lobe.  Likely due to pneumonia.  Hepatic steatosis.   Bilateral renal stones.  Aortic atherosclerosis   06/24/2022 Imaging   CT abdomen pelvis with contrast showed There are low-density lesions in liver consistent with metastatic disease with no significant change. Wall thickening in the cecum appears less prominent. There is 5.5 x 4.2 cm mixed density lesion in vaginal cuff suggesting possible metastatic disease.   There is no evidence of intestinal obstruction or pneumoperitoneum.There is no hydronephrosis.   Small patchy infiltrates are seen in the lower lung fields suggesting atelectasis/pneumonia.   Small bilateral renal stones. Diverticulosis of colon without signs of diverticulitis. 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 There is encroachment of neural foramina at L4-L5 and L5-S1 levels.     06/25/2022 Imaging   CT soft tissue neck with contrast showed1No acute process in the neck to explain the patient's difficulty swallowing.  CT chest contrast showed 1. Patchy areas of airspace opacity in the right upper lobe and left lower lobe and lingula slightly progressed since the prior radiograph and may represent worsening or recurrent pneumonia. Clinical correlation and follow-up to resolution recommended. 2. Aortic Atherosclerosis     06/24/2022 - 06/26/2022 patient was hospitalized due to difficulty swallowing, 1 day of abdominal pain.  Patient had extensive work-up including EGD which showed erosive gastritis.  No significant esophageal or gastric abnormality.  CT scan showed possible aspiration induced pneumonitis.  Antibiotics was held.  Today patient denies any fever, chills,.  Occasionally she has cough.  There is plan of possible relocation to [place] area next year.   INTERVAL HISTORY Reniyah Y Ficek is a 76 y.o. female who has above history reviewed by me today presents for follow up visit for management of metastatic colon cancer Patient was accompanied by daughter + fatigue,  + appetite has  increased since last visit. Weight remains stable.  No nausea vomiting diarrhea today.   Right side pre-existing neuropathy . On gabapentin.  Left hand finger tip neuropathy intermittent.   Review of Systems  Constitutional:  Positive for fatigue. Negative for appetite change, chills, fever and unexpected weight change.  HENT:   Negative for hearing loss and voice change.   Eyes:  Negative for eye problems.  Respiratory:  Negative for chest tightness and cough.   Cardiovascular:  Negative for chest pain.  Gastrointestinal:  Negative for abdominal distention, abdominal pain, blood in stool and diarrhea.  Endocrine: Negative for hot flashes.  Genitourinary:  Negative for difficulty urinating and frequency.   Musculoskeletal:  Positive for back pain. Negative for arthralgias.  Skin:  Negative for itching and rash.  Neurological:  Positive for numbness (chornic finger tips). Negative for extremity weakness.  Hematological:  Negative for adenopathy.  Psychiatric/Behavioral:  Negative for confusion.     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)    Moderate mitral insufficiency    Port-A-Cath in place      Seizures ([place])    Spinal stenosis    Ulcer    Urinary incontinence     SURGICAL HISTORY: Past Surgical History:  Procedure Laterality Date   ABDOMINAL HYSTERECTOMY     BACK SURGERY     due to polio   BREAST BIOPSY Bilateral    neg   BREAST BIOPSY Right 2011   neg/stereo   CARPAL TUNNEL RELEASE     CATARACT EXTRACTION Right 2020   COLONOSCOPY WITH PROPOFOL N/A 04/04/2021   Procedure: COLONOSCOPY WITH PROPOFOL;  Surgeon: Virgel Manifold, MD;  Location: [facility] ENDOSCOPY;  Service: Endoscopy;  Laterality: N/A;   ESOPHAGOGASTRODUODENOSCOPY (EGD) WITH PROPOFOL N/A 06/25/2022   Procedure: ESOPHAGOGASTRODUODENOSCOPY (EGD) WITH PROPOFOL;   Surgeon: Lin Landsman, MD;  Location: [facility] ENDOSCOPY;  Service: Gastroenterology;  Laterality: N/A;   EYE SURGERY     IR IMAGING GUIDED PORT INSERTION  10/09/2021    SOCIAL HISTORY: Social History   Socioeconomic History   Marital status: Divorced    Spouse name: Not on file   Number of children: Not on file   Years of education: Not on file   Highest education level: Not on file  Occupational History   Not on file  Tobacco Use   Smoking status: Never   Smokeless tobacco: Never  Vaping Use   Vaping Use: Never used  Substance and Sexual Activity   Alcohol use: Never   Drug use: No   Sexual activity: Not Currently  Other Topics Concern   Not on file  Social History Narrative   Not on file   Social Determinants of Health   Financial Resource Strain: Low Risk  (01/12/2022)   Overall Financial Resource Strain (CARDIA)    Difficulty of Paying Living Expenses: Not very hard  Food Insecurity: No Food Insecurity (06/24/2022)   Hunger Vital Sign    Worried About Running Out of Food in the Last Year: Never true    Ran Out of Food in the Last Year: Never true  Transportation Needs: No Transportation Needs (06/24/2022)   PRAPARE - Hydrologist (Medical): No    Lack of Transportation (Non-Medical): No  Physical Activity: Inactive (01/12/2022)   Exercise Vital Sign    Days of Exercise per Week: 0 days    Minutes of Exercise per Session: 0 min  Stress: Stress Concern Present (01/12/2022)   [place]    Feeling of Stress : To some extent  Social Connections: Moderately Integrated (01/12/2022)   Social Connection and Isolation Panel [NHANES]    Frequency of Communication with Friends and Family: Three times a week    Frequency of Social Gatherings with Friends and Family: Three times a week    Attends Religious Services: 1 to 4 times per year    Active Member of Clubs or Organizations:  No    Attends Archivist Meetings: 1 to 4 times per year    Marital Status: Widowed  Intimate Partner Violence: Not At Risk (06/24/2022)   Humiliation, Afraid, Rape, and Kick questionnaire    Fear of Current or Ex-Partner: No    Emotionally Abused: No    Physically Abused: No    Sexually Abused: No    FAMILY HISTORY: Family History  Problem Relation Age of Onset   Arthritis Mother    Heart disease Mother    Stroke Mother    Hypertension Mother    COPD Mother    Sudden death  Sister    Other Father        unknown medical history   Breast cancer Neg Hx     ALLERGIES:  is allergic to penicillins.  MEDICATIONS:  Current Outpatient Medications  Medication Sig Dispense Refill   acetaminophen (TYLENOL) 500 MG tablet Take 500 mg by mouth every 6 (six) hours as needed.     ALPRAZolam (XANAX) 0.5 MG tablet TAKE 1 TABLET BY MOUTH TWICE A DAY AS NEEDED FOR ANXIETY 10.11.23 60 tablet 0   atorvastatin (LIPITOR) 10 MG tablet TAKE 1 TABLET BY MOUTH EVERY DAY 90 tablet 0   busPIRone (BUSPAR) 5 MG tablet Take 1 tablet (5 mg total) by mouth 3 (three) times daily. 270 tablet 1   Cyanocobalamin (VITAMIN B 12 PO) Take 500 mcg by mouth daily.     diclofenac Sodium (VOLTAREN) 1 % GEL Apply 4 g topically 4 (four) times daily. 4 g 0   docusate sodium (COLACE) 100 MG capsule Take 1 capsule (100 mg total) by mouth 2 (two) times daily. 60 capsule 0   FLUoxetine (PROZAC) 20 MG capsule TAKE 3 CAPSULES (60 MG TOTAL) BY MOUTH EVERY MORNING. 270 capsule 0   gabapentin (NEURONTIN) 100 MG capsule Take 2 capsules (200 mg total) by mouth 3 (three) times daily. 180 capsule 1   lidocaine-prilocaine (EMLA) cream Apply small amount of cream to port site 1-2 hour prior to chemo treatment. 30 g 6   loperamide (IMODIUM) 2 MG capsule TAKE 1 CAP BY MOUTH SEE ADMIN INSTRUCTIONS. INITIAL: 4 MG, FOLLOWED BY 2 MG AFTER EACH LOOSE STOOL MAXIMUM: 16 MG/DAY 60 capsule 0   loratadine (CLARITIN) 10 MG tablet Take 1 tablet  (10 mg total) by mouth See admin instructions. Take 1 tablet daily for 4 days after each chemotherapy treatments. 90 tablet 0   meclizine (ANTIVERT) 12.5 MG tablet Take 1 tablet (12.5 mg total) by mouth 3 (three) times daily as needed for dizziness. 30 tablet 0   methocarbamol (ROBAXIN) 500 MG tablet TAKE 1 TABLET TWICE DAILY AS NEEDED FOR MUSCLE SPASM(S) 180 tablet 0   ondansetron (ZOFRAN) 8 MG tablet Take 1 tablet (8 mg total) by mouth 2 (two) times daily as needed (Nausea or vomiting). 30 tablet 1   polyethylene glycol powder (GLYCOLAX/MIRALAX) 17 GM/SCOOP powder Take 17 g by mouth daily. 3350 g 1   potassium chloride SA (KLOR-CON M) 20 MEQ tablet Take 2 tablets (40 mEq total) by mouth daily. 90 tablet 0   sucralfate (CARAFATE) 1 GM/10ML suspension Take 10 mLs (1 g total) by mouth 4 (four) times daily -  with meals and at bedtime. 420 mL 0   triamterene-hydrochlorothiazide (MAXZIDE-25) 37.5-25 MG tablet TAKE 1 TABLET EVERY DAY 90 tablet 0   No current facility-administered medications for this visit.   Facility-Administered Medications Ordered in Other Visits  Medication Dose Route Frequency Provider Last Rate Last Admin   fluorouracil (ADRUCIL) 3,950 mg in sodium chloride 0.9 % 71 mL chemo infusion  2,400 mg/m2 (Order-Specific) Intravenous 1 day or 1 dose [name], MD       heparin lock flush 100 UNIT/ML injection            leucovorin 660 mg in dextrose 5 % 250 mL infusion  400 mg/m2 (Order-Specific) Intravenous Once [name], MD 566 mL/hr at 09/21/22 1143 660 mg at 09/21/22 1143   palonosetron (ALOXI) 0.25 MG/5ML injection            prochlorperazine (COMPAZINE) 10 MG tablet  PHYSICAL EXAMINATION: ECOG PERFORMANCE STATUS: 2 - Symptomatic, <50% confined to bed Vitals:   09/21/22 0908  BP: 108/69  Pulse: 92  Temp: (!) 96.8 F (36 C)  SpO2: 98%    Filed Weights   09/21/22 0908  Weight: 145 lb 3.2 oz (65.9 kg)      Physical Exam Constitutional:      General: She  is not in acute distress.    Comments: Patient sits in wheelchair  HENT:     Head: Normocephalic and atraumatic.  Eyes:     General: No scleral icterus. Cardiovascular:     Rate and Rhythm: Normal rate and regular rhythm.     Heart sounds: Normal heart sounds.  Pulmonary:     Effort: Pulmonary effort is normal. No respiratory distress.     Breath sounds: No wheezing.  Abdominal:     General: Bowel sounds are normal. There is no distension.     Palpations: Abdomen is soft.  Musculoskeletal:        General: No deformity. Normal range of motion.     Cervical back: Normal range of motion and neck supple.  Skin:    General: Skin is warm and dry.     Findings: No erythema or rash.  Neurological:     Mental Status: She is alert and oriented to person, place, and time. Mental status is at baseline.     Cranial Nerves: No cranial nerve deficit.     Coordination: Coordination normal.  Psychiatric:        Mood and Affect: Mood normal.     LABORATORY DATA:  I have reviewed the data as listed     Latest Ref Rng & Units 09/21/2022    8:57 AM 09/07/2022    8:57 AM 08/24/2022    9:11 AM  CBC  WBC 4.0 - 10.5 K/uL 4.1  12.0  11.4   Hemoglobin 12.0 - 15.0 g/dL 9.8  10.3  10.1   Hematocrit 36.0 - 46.0 % 30.8  32.9  32.7   Platelets 150 - 400 K/uL 129  144  169       Latest Ref Rng & Units 09/21/2022    8:57 AM 09/07/2022    8:57 AM 08/24/2022    9:11 AM  CMP  Glucose 70 - 99 mg/dL 127  141  101   BUN 8 - 23 mg/dL 16  11  10   Creatinine 0.44 - 1.00 mg/dL 0.76  0.80  0.79   Sodium 135 - 145 mmol/L 140  141  137   Potassium 3.5 - 5.1 mmol/L 3.1  3.3  3.1   Chloride 98 - 111 mmol/L 104  105  105   CO2 22 - 32 mmol/L 26  28  27   Calcium 8.9 - 10.3 mg/dL 8.9  9.0  8.7   Total Protein 6.5 - 8.1 g/dL 7.1  6.8  7.0   Total Bilirubin 0.3 - 1.2 mg/dL 0.8  0.5  0.4   Alkaline Phos 38 - 126 U/L 66  122  104   AST 15 - 41 U/L 22  25  23   ALT 0 - 44 U/L 9  11  11      RADIOGRAPHIC  STUDIES: I have personally reviewed the radiological images as listed and agreed with the findings in the report. MM DIAG BREAST TOMO UNI LEFT  Result Date: 07/31/2022 CLINICAL DATA:  The lung 76-year-old female recalled from screening mammogram dated 07/09/2022 for a possible left   breast mass. EXAM: DIGITAL DIAGNOSTIC UNILATERAL LEFT MAMMOGRAM WITH TOMOSYNTHESIS; ULTRASOUND LEFT BREAST LIMITED TECHNIQUE: Left digital diagnostic mammography and breast tomosynthesis was performed.; Targeted ultrasound examination of the left breast was performed. COMPARISON:  Previous exam(s). ACR Breast Density Category b: There are scattered areas of fibroglandular density. FINDINGS: There is a persistent oval, circumscribed equal density mass in the upper outer left breast at mid depth. Note is made of associated coarse calcifications. Targeted ultrasound is performed, showing an oval, circumscribed hypoechoic mass with internal calcifications at the 2 o'clock position 10 cm from the nipple. It measures 8 x 4 x 7 mm without significant internal vascularity. This correlates well with the mammographic finding. 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 Recommend short-term follow-up. RECOMMENDATION: Left breast ultrasound in 6 months. I have discussed the findings and recommendations with the patient. If applicable, a reminder letter will be sent to the patient regarding the next appointment. BI-RADS CATEGORY  3: Probably benign. Electronically Signed   By: Kristopher Oppenheim M.D.   On: 07/31/2022 12:06  US BREAST LTD UNI LEFT INC AXILLA  Result Date: 07/31/2022 CLINICAL DATA:  The lung 76 year old female recalled from screening mammogram dated 07/09/2022 for a possible left breast mass. EXAM: DIGITAL DIAGNOSTIC UNILATERAL LEFT MAMMOGRAM WITH TOMOSYNTHESIS; ULTRASOUND LEFT BREAST LIMITED TECHNIQUE: Left digital diagnostic mammography and breast tomosynthesis was performed.;  Targeted ultrasound examination of the left breast was performed. COMPARISON:  Previous exam(s). ACR Breast Density Category b: There are scattered areas of fibroglandular density. FINDINGS: There is a persistent oval, circumscribed equal density mass in the upper outer left breast at mid depth. Note is made of associated coarse calcifications. Targeted ultrasound is performed, showing an oval, circumscribed hypoechoic mass with internal calcifications at the 2 o'clock position 10 cm from the nipple. It measures 8 x 4 x 7 mm without significant internal vascularity. This correlates well with the mammographic finding. 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 Recommend short-term follow-up. RECOMMENDATION: Left breast ultrasound in 6 months. I have discussed the findings and recommendations with the patient. If applicable, a reminder letter will be sent to the patient regarding the next appointment. BI-RADS CATEGORY  3: Probably benign. Electronically Signed   By: Kristopher Oppenheim M.D.   On: 07/31/2022 12:06  DG Bone Density  Result Date: 07/13/2022 EXAM: DUAL X-RAY ABSORPTIOMETRY (DXA) FOR BONE MINERAL DENSITY IMPRESSION: Your patient Chelsea [name] completed a BMD test on 07/09/2022 using the [employer] (software version: 14.10) manufactured by [employer]. The following summarizes the results of our evaluation. Technologist:VLM PATIENT BIOGRAPHICAL: Name: Chelsea [name], Chelsea [name] Patient ID: [id number] Birth Date: [birth year] Height: 57.0 in. Gender: Female Exam Date: 07/09/2022 Weight: 148.0 lbs. Indications: History of kidney disease, Hysterectomy, Postmenopausal Fractures: Treatments: Protonix DENSITOMETRY RESULTS: Site         Region     Measured Date Measured Age WHO Classification Young Adult T-score BMD         %Change vs. Previous Significant Change (*) AP Spine L2-L4 6/09/[age] 77.1 Normal 3.8 1.683 g/cm2 - - DualFemur Neck Left  6/09/2021 77.1 Normal 0.8 1.144 g/cm2 - - Left Forearm Radius 33% 07[age]09/2021 77.1 Normal 1.6 1.020 g/cm2 - - ASSESSMENT: The BMD measured at Femur Neck Left is 1.144 g/cm2 with a T-score of 0.8. This patient is considered normal according to [facility]) criteria. L-1 was excluded due to degenerative changes. The scan quality is good. World Pharmacologist [facility])  criteria for post-menopausal, Caucasian Women: Normal:                   T-score at or above -1 SD Osteopenia/low bone mass: T-score between -1 and -2.5 SD Osteoporosis:             T-score at or below -2.5 SD RECOMMENDATIONS: 1. All patients should optimize calcium and vitamin D intake. 2. Consider FDA-approved medical therapies in postmenopausal women and men aged 50 years and older, based on the following: a. A hip or vertebral(clinical or morphometric) fracture b. T-score < -2.5 at the femoral neck or spine after appropriate evaluation to exclude secondary causes c. Low bone mass (T-score between -1.0 and -2.5 at the femoral neck or spine) and a 10-year probability of a hip fracture > 3% or a 10-year probability of a major osteoporosis-related fracture > 20% based on the [place]-adapted WHO algorithm 3. Clinician judgment and/or patient preferences may indicate treatment for people with 10-year fracture probabilities above or below these levels FOLLOW-UP: People with diagnosed cases of osteoporosis or at high risk for fracture should have regular bone mineral density tests. For patients eligible for Medicare, routine testing is allowed once every 2 years. The testing frequency can be increased to one year for patients who have rapidly progressing disease, those who are receiving or discontinuing medical therapy to restore bone mass, or have additional risk factors. I have reviewed this report, and agree with the above findings. [facility] Electronically Signed   By: Tyler  Litton M.D.   On: 07/13/2022 07:57   MM 3D  SCREEN BREAST BILATERAL  Result Date: 07/10/2022 CLINICAL DATA:  Screening. EXAM: DIGITAL SCREENING BILATERAL MAMMOGRAM WITH TOMOSYNTHESIS AND CAD TECHNIQUE: Bilateral screening digital craniocaudal and mediolateral oblique mammograms were obtained. Bilateral screening digital breast tomosynthesis was performed. The images were evaluated with computer-aided detection. COMPARISON:  Previous exam(s). ACR Breast Density Category b: There are scattered areas of fibroglandular density. FINDINGS: In the left breast, a possible mass warrants further evaluation. In the right breast, no findings suspicious for malignancy. IMPRESSION: Further evaluation is suggested for a possible mass in the left breast. RECOMMENDATION: Diagnostic mammogram and possibly ultrasound of the left breast. (Code:[id number]) The patient will be contacted regarding the findings, and additional imaging will be scheduled. BI-RADS CATEGORY  0: Incomplete. Need additional imaging evaluation and/or prior mammograms for comparison. Electronically Signed   By: Serena  Chacko M.D.   On: 07/10/2022 13:15   CT CHEST W CONTRAST  Result Date: 06/25/2022 CLINICAL DATA:  Aspiration. EXAM: CT CHEST WITH CONTRAST TECHNIQUE: Multidetector CT imaging of the chest was performed during intravenous contrast administration. RADIATION DOSE REDUCTION: This exam was performed according to the departmental dose-optimization program which includes automated exposure control, adjustment of the mA and/or kV according to patient size and/or use of iterative reconstruction technique. CONTRAST:  75mL OMNIPAQUE IOHEXOL 300 MG/ML  SOLN COMPARISON:  CT of the chest abdomen pelvis dated 04/13/2022. FINDINGS: Cardiovascular: There is no cardiomegaly or pericardial effusion. Mild atherosclerotic calcification of the thoracic aorta. No aneurysmal dilatation or dissection. The origins of the great vessels of the aortic arch and the central pulmonary arteries are patent. Left-sided  Port-A-Cath with tip in the right atrium close to the cavoatrial junction. Mediastinum/Nodes: No hilar or mediastinal adenopathy. Calcified granuloma. The esophagus is grossly unremarkable. No mediastinal fluid collection. Lungs/Pleura: Patchy areas of airspace opacity in the right upper lobe, progressed since the prior radiograph may represent worsening or recurrent pneumonia. Clinical correlation and   follow-up to resolution recommended. Partially calcified right middle lobe nodule, likely granuloma. Patchy areas of ground-glass density involving the left lower lobe and lingula similar or slightly progressed since the prior radiograph and may represent worsening infiltrate. Aspiration is not excluded. No pleural effusion or pneumothorax. The central airways are patent. Upper Abdomen: Probable gallbladder sludge or small stones. Fatty liver with a possible changes of cirrhosis. Small bilateral renal calculi. Areas of cortical irregularity and scarring of the kidneys. Musculoskeletal: Osteopenia with degenerative changes of the spine. 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 No acute osseous pathology. IMPRESSION: 1. Patchy areas of airspace opacity in the right upper lobe and left lower lobe and lingula slightly progressed since the prior radiograph and may represent worsening or recurrent pneumonia. Clinical correlation and follow-up to resolution recommended. 2. Aortic Atherosclerosis (ICD10-I70.0). Electronically Signed   By: Arash  Radparvar M.D.   On: 06/25/2022 18:06   CT SOFT TISSUE NECK W CONTRAST  Result Date: 06/25/2022 CLINICAL DATA:  Swallowing difficulties, palate weakness (CN 9) EXAM: CT NECK WITH CONTRAST TECHNIQUE: Multidetector CT imaging of the neck was performed using the standard protocol following the bolus administration of intravenous contrast. RADIATION DOSE REDUCTION: This exam was performed according to the departmental  dose-optimization program which includes automated exposure control, adjustment of the mA and/or kV according to patient size and/or use of iterative reconstruction technique. CONTRAST:  75mL OMNIPAQUE IOHEXOL 300 MG/ML  SOLN COMPARISON:  None Available. FINDINGS: Pharynx and larynx: Normal. No mass or swelling. Salivary glands: No inflammation, mass, or stone. Thyroid: Normal. Lymph nodes: None enlarged or abnormal density. Vascular: Patent. Limited intracranial: Negative. Visualized orbits: Negative. Mastoids and visualized paranasal sinuses: Clear. Skeleton: No acute osseous abnormality. Posterior fusion and decompression C3-C6. Upper chest: Please see same-day CT chest. Other: None. IMPRESSION: 1. No acute process in the neck to explain the patient's difficulty swallowing. 2.  For findings in the thorax, please see same day CT chest. Electronically Signed   By: Alison  Vasan M.D.   On: 06/25/2022 18:02   CT ABDOMEN PELVIS W CONTRAST  Result Date: 06/24/2022 CLINICAL DATA:  Pain right lower quadrant, pelvic pain, vomiting, metastatic colon carcinoma EXAM: CT ABDOMEN AND PELVIS WITH CONTRAST TECHNIQUE: Multidetector CT imaging of the abdomen and pelvis was performed using the standard protocol following bolus administration of intravenous contrast. RADIATION DOSE REDUCTION: This exam was performed according to the departmental dose-optimization program which includes automated exposure control, adjustment of the mA and/or kV according to patient size and/or use of iterative reconstruction technique. CONTRAST:  100mL OMNIPAQUE IOHEXOL 300 MG/ML  SOLN COMPARISON:  Previous studies including the examination of 04/13/2022 FINDINGS: Lower chest: Small patchy infiltrates are seen in lower lung fields, more so in the left lower lobe. There is calcified granuloma in left lower lung field. Hepatobiliary: There are a few low-density lesions in liver largest measuring 2 cm with no significant interval change. There is  fatty infiltration in liver. Gallbladder is distended. There is no wall thickening. There is no dilation of bile ducts. Pancreas: No focal abnormalities are seen. Spleen: Unremarkable. Adrenals/Urinary Tract: Adrenals are unremarkable. There is no hydronephrosis. 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 There is 2 mm calculus in the right kidney. There is 2 mm calculus in the left kidney. Urinary bladder is distended. Diverticulum is noted in the left posterolateral wall of the bladder. Stomach/Bowel: Stomach is not distended. Small bowel loops are not dilated. Appendix is not   seen. There is no pericecal inflammation. Wall thickening in cecum appears less prominent. In the current study, maximum wall thickness in the medial aspect of the cecum measures 12 mm. Few scattered diverticula are seen in colon without signs of focal acute diverticulitis. Vascular/Lymphatic: Scattered arterial calcifications are seen. Reproductive: Uterus is not seen. There is 5.5 x 4.2 cm inhomogeneous soft tissue mass and fluid density in vaginal cuff with no significant interval change. Small amount of fluid is seen in the vaginal canal. Other: There is no ascites or pneumoperitoneum. Small umbilical hernia containing fat is seen. Musculoskeletal: There is minimal anterolisthesis at L4-L5 level. There is severe spinal stenosis and encroachment of neural foramina at L4-L5 level. There is encroachment of neural foramina at L5-S1 level, more so on the right side. There is partial fusion of bodies of L5 and S1 vertebrae. IMPRESSION: There are low-density lesions in liver consistent with metastatic disease with no significant change. Wall thickening in the cecum appears less prominent. There is 5.5 x 4.2 cm mixed density lesion in vaginal cuff suggesting possible metastatic disease. There is no evidence of intestinal obstruction or pneumoperitoneum. There is no hydronephrosis. Small patchy  infiltrates are seen in the lower lung fields suggesting atelectasis/pneumonia. Small bilateral renal stones. Diverticulosis of colon without signs of diverticulitis. 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 There is encroachment of neural foramina at L4-L5 and L5-S1 levels. Other findings as described in the body of the report. Electronically Signed   By: Elmer Picker M.D.   On: 06/24/2022 14:27   DG Chest 2 View  Result Date: 06/24/2022 CLINICAL DATA:  Dysphagia. EXAM: CHEST - 2 VIEW COMPARISON:  Chest x-ray 06/17/2021. FINDINGS: Similar mild enlargement the cardiac silhouette. Left IJ approach Port-A-Cath with the tip projecting at the superior right atrium. No consolidation. No visible pleural effusions or pneumothorax. Partially imaged ACDF. Polyarticular degenerative change. 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 IMPRESSION: No evidence of acute cardiopulmonary disease. Electronically Signed   By: Margaretha Sheffield M.D.   On: 06/24/2022 12:18

## 2022-09-21 NOTE — Telephone Encounter (Signed)
Requested Prescriptions  Pending Prescriptions Disp Refills   busPIRone (BUSPAR) 5 MG tablet [Pharmacy Med Name: BUSPIRONE HCL 5 MG TABLET] 270 tablet     Sig: TAKE 1 TABLET BY MOUTH THREE TIMES A DAY     Psychiatry: Anxiolytics/Hypnotics - Non-controlled Passed - 09/20/2022  8:12 AM      Passed - Valid encounter within last 12 months    Recent Outpatient Visits           2 months ago Erosive gastritis   [facility] [place], Coralie Keens, NP   4 months ago Encounter for general adult medical examination with abnormal findings   [facility] [place], Coralie Keens, NP   7 months ago Right leg weakness   [facility] [place], Coralie Keens, NP   10 months ago Pure hypercholesterolemia   [facility] [place], Coralie Keens, NP   1 year ago 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 [facility])   [facility], Coralie Keens, NP       Future Appointments             In 3 weeks Garnette Gunner, Coralie Keens, NP [facility], [facility]

## 2022-09-21 NOTE — Assessment & Plan Note (Signed)
Hemoglobin is stable.  Continue monitor.   

## 2022-09-21 NOTE — Assessment & Plan Note (Signed)
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  Bilateral fingertips, pre-existing right side neuropathy is slightly worse. Left side is intermittent.  gabapentin to 200 mg 3 times daily. She declines acupuncture referral.  Oxaliplatin is discontinued

## 2022-09-21 NOTE — Assessment & Plan Note (Signed)
Chemotherapy plan as listed above 

## 2022-09-22 ENCOUNTER — Ambulatory Visit: Admission: RE | Admit: 2022-09-22 | Payer: Medicare HMO | Source: Ambulatory Visit

## 2022-09-22 LAB — CEA: CEA: 3.3 ng/mL (ref 0.0–4.7)

## 2022-09-23 ENCOUNTER — Inpatient Hospital Stay: Payer: Medicare HMO

## 2022-09-23 DIAGNOSIS — C189 Malignant neoplasm of colon, unspecified: Secondary | ICD-10-CM

## 2022-09-23 DIAGNOSIS — D6481 Anemia due to antineoplastic chemotherapy: Secondary | ICD-10-CM | POA: Diagnosis not present

## 2022-09-23 DIAGNOSIS — C787 Secondary malignant neoplasm of liver and intrahepatic bile duct: Secondary | ICD-10-CM | POA: Diagnosis not present

## 2022-09-23 DIAGNOSIS — C180 Malignant neoplasm of cecum: Secondary | ICD-10-CM | POA: Diagnosis not present

## 2022-09-23 DIAGNOSIS — Z5111 Encounter for antineoplastic chemotherapy: Secondary | ICD-10-CM | POA: Diagnosis not present

## 2022-09-23 DIAGNOSIS — Z79899 Other long term (current) drug therapy: Secondary | ICD-10-CM | POA: Diagnosis not present

## 2022-09-23 DIAGNOSIS — Z5112 Encounter for antineoplastic immunotherapy: Secondary | ICD-10-CM | POA: Diagnosis not present

## 2022-09-23 MED ORDER — SODIUM CHLORIDE 0.9% FLUSH
10.0000 mL | INTRAVENOUS | Status: DC | PRN
  Administered 2022-09-23: 10 mL
  Filled 2022-09-23: qty 10

## 2022-09-23 MED ORDER — HEPARIN SOD (PORK) LOCK FLUSH 100 UNIT/ML IV SOLN
500.0000 [IU] | Freq: Once | INTRAVENOUS | Status: AC | PRN
  Administered 2022-09-23: 500 [IU]
  Filled 2022-09-23: qty 5

## 2022-09-23 NOTE — Patient Instructions (Signed)
[facility] [place]  Discharge Instructions: Thank you for choosing [facility] to provide your oncology and hematology care.  If you have a lab appointment with the [facility], please go directly to the [facility] and check in at the registration area.  Wear comfortable clothing and clothing appropriate for easy access to any Portacath or PICC line.   We strive to give you quality time with your provider. You may need to reschedule your appointment if you arrive late (15 or more minutes).  Arriving late affects you and other patients whose appointments are after yours.  Also, if you miss three or more appointments without notifying the office, you may be dismissed from the clinic at the provider's discretion.      For prescription refill requests, have your pharmacy contact our office and allow 72 hours for refills to be completed.    Today you received the following chemotherapy and/or immunotherapy agents PUMP DC      To help prevent nausea and vomiting after your treatment, we encourage you to take your nausea medication as directed.  BELOW ARE SYMPTOMS THAT SHOULD BE REPORTED IMMEDIATELY: *FEVER GREATER THAN 100.4 F (38 C) OR HIGHER *CHILLS OR SWEATING *NAUSEA AND VOMITING THAT IS NOT CONTROLLED WITH YOUR NAUSEA MEDICATION *UNUSUAL SHORTNESS OF BREATH *UNUSUAL BRUISING OR BLEEDING *URINARY PROBLEMS (pain or burning when urinating, or frequent urination) *BOWEL PROBLEMS (unusual diarrhea, constipation, pain near the anus) TENDERNESS IN MOUTH AND THROAT WITH OR WITHOUT PRESENCE OF ULCERS (sore throat, sores in mouth, or a toothache) UNUSUAL RASH, SWELLING OR PAIN  UNUSUAL VAGINAL DISCHARGE OR ITCHING   Items with * indicate a potential emergency and should be followed up as soon as possible or go to the Emergency Department if any problems should occur.  Please show the CHEMOTHERAPY ALERT CARD or IMMUNOTHERAPY ALERT CARD at check-in to the  Emergency Department and triage nurse.  Should you have questions after your visit or need to cancel or reschedule your appointment, please contact [facility] [place] [facility] [place]  [phone] and follow the prompts.  Office hours are 8:00 a.m. to 4:30 p.m. Monday - Friday. Please note that voicemails left after 4:00 p.m. may not be returned until the following business day.  We are closed weekends and major holidays. You have access to a nurse at all times for urgent questions. Please call the main number to the clinic [phone] and follow the prompts.  For any non-urgent questions, you may also contact your provider using MyChart. We now offer e-Visits for anyone 60 and older to request care online for non-urgent symptoms. For details visit [email or web address].   Also download the MyChart app! Go to the app store, search "MyChart", open the app, select [facility], and log in with your MyChart username and password.  Masks are optional in the cancer centers. If you would like for your care team to wear a mask while they are taking care of you, please let them know. For doctor visits, patients may have with them one support person who is at least 77 years old. At this time, visitors are not allowed in the infusion area.

## 2022-09-25 ENCOUNTER — Inpatient Hospital Stay: Payer: Medicare HMO | Admitting: Internal Medicine

## 2022-09-30 ENCOUNTER — Other Ambulatory Visit: Payer: Medicare HMO

## 2022-09-30 ENCOUNTER — Ambulatory Visit: Admission: RE | Admit: 2022-09-30 | Payer: Medicare HMO | Source: Ambulatory Visit

## 2022-10-01 ENCOUNTER — Other Ambulatory Visit: Payer: Self-pay | Admitting: Internal Medicine

## 2022-10-01 DIAGNOSIS — F411 Generalized anxiety disorder: Secondary | ICD-10-CM

## 2022-10-02 NOTE — Telephone Encounter (Signed)
Requested medication (s) are due for refill today: Yes  Requested medication (s) are on the active medication list: Yes  Last refill:  08/09/22  Future visit scheduled: Yes  Notes to clinic:  Unable to refill per protocol, cannot delegate.      Requested Prescriptions  Pending Prescriptions Disp Refills   ALPRAZolam (XANAX) 0.5 MG tablet [Pharmacy Med Name: ALPRAZOLAM 0.5 MG TABLET] 60 tablet 0    Sig: TAKE 1 TABLET BY MOUTH TWICE A DAY AS NEEDED FOR ANXIETY 10.11.23     Not Delegated - Psychiatry: Anxiolytics/Hypnotics 2 Failed - 10/01/2022 10:34 AM      Failed - This refill cannot be delegated      Failed - Urine Drug Screen completed in last 360 days      Passed - Patient is not pregnant      Passed - Valid encounter within last 6 months    Recent Outpatient Visits           2 months ago Erosive gastritis   [facility] [place], Coralie Keens, NP   5 months ago Encounter for general adult medical examination with abnormal findings   [facility] [place], Coralie Keens, NP   7 months ago Right leg weakness   [facility] [place], Coralie Keens, NP   11 months ago Pure hypercholesterolemia   [facility] [place], Coralie Keens, NP   1 year ago 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 [facility])   [facility], Coralie Keens, NP       Future Appointments             In 1 week Garnette Gunner, Coralie Keens, NP [facility], [facility]

## 2022-10-06 ENCOUNTER — Inpatient Hospital Stay: Payer: Medicare HMO | Attending: Oncology

## 2022-10-06 ENCOUNTER — Inpatient Hospital Stay: Payer: Medicare HMO

## 2022-10-06 ENCOUNTER — Encounter: Payer: Self-pay | Admitting: Oncology

## 2022-10-06 ENCOUNTER — Inpatient Hospital Stay (HOSPITAL_BASED_OUTPATIENT_CLINIC_OR_DEPARTMENT_OTHER): Payer: Medicare HMO | Admitting: Oncology

## 2022-10-06 VITALS — BP 116/59 | HR 71 | Resp 18

## 2022-10-06 VITALS — BP 102/58 | HR 77 | Temp 96.0°F | Wt 143.4 lb

## 2022-10-06 DIAGNOSIS — C787 Secondary malignant neoplasm of liver and intrahepatic bile duct: Secondary | ICD-10-CM

## 2022-10-06 DIAGNOSIS — C180 Malignant neoplasm of cecum: Secondary | ICD-10-CM | POA: Diagnosis not present

## 2022-10-06 DIAGNOSIS — C189 Malignant neoplasm of colon, unspecified: Secondary | ICD-10-CM

## 2022-10-06 DIAGNOSIS — D701 Agranulocytosis secondary to cancer chemotherapy: Secondary | ICD-10-CM | POA: Insufficient documentation

## 2022-10-06 DIAGNOSIS — D6481 Anemia due to antineoplastic chemotherapy: Secondary | ICD-10-CM | POA: Insufficient documentation

## 2022-10-06 DIAGNOSIS — Z79899 Other long term (current) drug therapy: Secondary | ICD-10-CM | POA: Diagnosis not present

## 2022-10-06 DIAGNOSIS — Z5112 Encounter for antineoplastic immunotherapy: Secondary | ICD-10-CM | POA: Diagnosis not present

## 2022-10-06 DIAGNOSIS — G629 Polyneuropathy, unspecified: Secondary | ICD-10-CM | POA: Diagnosis not present

## 2022-10-06 DIAGNOSIS — T451X5A Adverse effect of antineoplastic and immunosuppressive drugs, initial encounter: Secondary | ICD-10-CM

## 2022-10-06 DIAGNOSIS — R634 Abnormal weight loss: Secondary | ICD-10-CM | POA: Diagnosis not present

## 2022-10-06 DIAGNOSIS — Z5111 Encounter for antineoplastic chemotherapy: Secondary | ICD-10-CM | POA: Diagnosis not present

## 2022-10-06 LAB — COMPREHENSIVE METABOLIC PANEL
ALT: 11 U/L (ref 0–44)
AST: 26 U/L (ref 15–41)
Albumin: 3.4 g/dL — ABNORMAL LOW (ref 3.5–5.0)
Alkaline Phosphatase: 61 U/L (ref 38–126)
Anion gap: 7 (ref 5–15)
BUN: 13 mg/dL (ref 8–23)
CO2: 27 mmol/L (ref 22–32)
Calcium: 9 mg/dL (ref 8.9–10.3)
Chloride: 104 mmol/L (ref 98–111)
Creatinine, Ser: 0.86 mg/dL (ref 0.44–1.00)
GFR, Estimated: >60 mL/min (ref >60)
Glucose, Bld: 119 mg/dL — ABNORMAL HIGH (ref 70–99)
Potassium: 3.5 mmol/L (ref 3.5–5.1)
Sodium: 138 mmol/L (ref 135–145)
Total Bilirubin: 0.8 mg/dL (ref 0.3–1.2)
Total Protein: 6.9 g/dL (ref 6.5–8.1)

## 2022-10-06 LAB — CBC WITH DIFFERENTIAL/PLATELET
Abs Immature Granulocytes: 0.01 10*3/uL (ref 0.00–0.07)
Basophils Absolute: 0 10*3/uL (ref 0.0–0.1)
Basophils Relative: 1 %
Eosinophils Absolute: 0.1 10*3/uL (ref 0.0–0.5)
Eosinophils Relative: 2 %
HCT: 31.7 % — ABNORMAL LOW (ref 36.0–46.0)
Hemoglobin: 9.9 g/dL — ABNORMAL LOW (ref 12.0–15.0)
Immature Granulocytes: 0 %
Lymphocytes Relative: 44 %
Lymphs Abs: 1.3 10*3/uL (ref 0.7–4.0)
MCH: 32.7 pg (ref 26.0–34.0)
MCHC: 31.2 g/dL (ref 30.0–36.0)
MCV: 104.6 fL — ABNORMAL HIGH (ref 80.0–100.0)
Monocytes Absolute: 0.4 10*3/uL (ref 0.1–1.0)
Monocytes Relative: 15 %
Neutro Abs: 1.1 10*3/uL — ABNORMAL LOW (ref 1.7–7.7)
Neutrophils Relative %: 38 %
Platelets: 144 10*3/uL — ABNORMAL LOW (ref 150–400)
RBC: 3.03 MIL/uL — ABNORMAL LOW (ref 3.87–5.11)
RDW: 17.1 % — ABNORMAL HIGH (ref 11.5–15.5)
WBC: 3 10*3/uL — ABNORMAL LOW (ref 4.0–10.5)
nRBC: 0 % (ref 0.0–0.2)

## 2022-10-06 LAB — PROTEIN, URINE, RANDOM: Total Protein, Urine: 7 mg/dL

## 2022-10-06 MED ORDER — PROCHLORPERAZINE MALEATE 10 MG PO TABS
10.0000 mg | ORAL_TABLET | Freq: Once | ORAL | Status: AC
  Administered 2022-10-06: 10 mg via ORAL
  Filled 2022-10-06: qty 1

## 2022-10-06 MED ORDER — SODIUM CHLORIDE 0.9 % IV SOLN
2400.0000 mg/m2 | INTRAVENOUS | Status: DC
  Administered 2022-10-06: 3900 mg via INTRAVENOUS
  Filled 2022-10-06: qty 78

## 2022-10-06 MED ORDER — LEUCOVORIN CALCIUM INJECTION 350 MG
400.0000 mg/m2 | Freq: Once | INTRAVENOUS | Status: AC
  Administered 2022-10-06: 660 mg via INTRAVENOUS
  Filled 2022-10-06: qty 33

## 2022-10-06 MED ORDER — SODIUM CHLORIDE 0.9 % IV SOLN
Freq: Once | INTRAVENOUS | Status: AC
  Filled 2022-10-06: qty 250

## 2022-10-06 MED ORDER — SODIUM CHLORIDE 0.9 % IV SOLN
5.0000 mg/kg | Freq: Once | INTRAVENOUS | Status: AC
  Administered 2022-10-06: 350 mg via INTRAVENOUS
  Filled 2022-10-06: qty 14

## 2022-10-06 NOTE — Progress Notes (Signed)
Hematology/Oncology Progress note [phone] [phone]         Patient Care Team: Jearld Fenton, NP as PCP - General (Internal Medicine) Clent Jacks, RN as Oncology Nurse Navigator [name], MD as Consulting Physician (Oncology)  ASSESSMENT & PLAN:   Cancer Staging  Metastatic colon cancer to liver [facility]) Staging form: Colon and Rectum, AJCC 8th Edition - Clinical stage from 09/04/2021: Stage Unknown (cTX, cNX, cM1) - Signed by [name], MD on 05/13/2022   Metastatic colon cancer to liver [facility]) #Metastatic colon cancer to liver, 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  No sufficient tissue for NGS Liquid biopsy showed KRAS12V, APC, TP53 mutation, TMB 4.  S/p 6 cycles of 5-FU /Bevacizumab--> CT showed mixed response--> FOLFOX/bevacizumab.-->CT imaging shows stable disease --> dc oxaliplatin, 5-FU/Bev maintenance 09/07/22 Labs reviewed and discussed with patient Proceed with 5-FU/bevacizumab.[Oxaliplatin dc due to neuropathy]  If her disease progresses, consider adding irinotecan if she is able to tolerate. CT next week.   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  Stable disease.  Anemia due to antineoplastic chemotherapy Hemoglobin is stable.  Continue monitor.    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  Bilateral fingertips, pre-existing right side neuropathy is slightly worse. Left side is intermittent.  gabapentin to 200 mg 3 times daily. She declines acupuncture referral.  Oxaliplatin has been discontinued  Referred to neurology for management.   Weight loss We discussed about appetite stimulant Marinol.   Patient would like to defer for now and try nutrition supplements breast.  Continue follow-up with nutritionist  Orders Placed This Encounter  Procedures   Protein, urine, random     Standing Status:   Future    Standing Expiration Date:   12/04/2022   CEA    Standing Status:   Future    Standing Expiration Date:   11/04/2023   CBC with Differential    Standing Status:   Future    Standing Expiration Date:   11/04/2023   Comprehensive metabolic panel    Standing Status:   Future    Standing Expiration Date:   11/04/2023    Follow-up in  2 weels  lab MD 5-FU/bevacizumab with day 3 pump DC All questions were answered. The patient knows to call the clinic with any problems, questions or concerns.  [name], MD, PhD [facility] Hematology Oncology 10/06/2022   CHIEF COMPLAINTS/REASON FOR VISIT:  metastatic colon cancer  HISTORY OF PRESENTING ILLNESS:   Chelsea [name] is a  77 y.o.  female presents for management of metastatic colon cancer. Oncology History  Metastatic colon cancer to liver ([place])  08/21/2021 Imaging   CT abdomen pelvis with contrast  showed soft tissue mass at the base of cecum measuring up to 5 cm, slightly increased in size.  Interval development of multiple low-density lesions throughout the liver highly suggestive for metastatic disease.  Complex mass in the region of the vaginal cuff measuring up to 5.2 cm, increased in size.  Right ovary also appears more prominent on the current examination.  Moderate large volume of stool throughout the colon.  Mild urinary bladder wall thickening.  Colonic diverticulosis without evidence of active diverticulitis.  Aortic atherosclerosis   09/04/2021 Procedure   patient is status post left lobe liver lesion biopsy and pathology showed metastatic adenocarcinoma, compatible with colorectal primary.   09/04/2021 Cancer Staging   Staging form: Colon  and Rectum, AJCC 8th Edition - Clinical stage from 09/04/2021: Stage Unknown (cTX, cNX, cM1) - Signed by [name], MD on 05/13/2022 Stage prefix: Initial diagnosis   09/16/2021 Initial Diagnosis   Metastatic colon cancer to liver [facility]) [facility] one liquid biopsy  testing showed KRASG12V, APC TP53 TMB 4  Patient initially went to emergency room on 03/24/2021 for abdominal pain. 03/24/2021, CT scan of the abdomen showed marked bladder wall thickening with surrounding soft tissue stranding is noted.  Concerning for cystitis.  There is a lobulated mass between the posterior wall of the bladder and the rectum which appears to arise from the vaginal cuff.  This is indeterminate and difficult to characterize reflecting lack of IV contrast material.  Nonobstructing left renal calculus, left sacroiliitis, lumbar spondylosis aortic atherosclerosis. 03/24/2021 CT pelvic showed abdominal [place] soft tissue of right cecum, concerning for colon carcinoma.  Colonoscopy recommended.  Low-attenuation mass in the region of vaginal cuff is again noted.  Diffuse urinary bladder wall thickening and mild bilateral hydroureter possibly cystitis.   [place] 03/24/2021 Lobulated solid 3.8 cm mass confirmed by ultrasound. This is most compatible with a neoplasm but remains indeterminate as to the origin as the epicenter seems to be outside of both the vaginal cuff and the adjacent colon, while the lesion appears inseparable from both. Extensive echogenic debris within the distended urinary bladder.   03/26/2021, patient was seen by Dr. Theora Gianotti for evaluation of colonic/vaginal cuff pelvic mass.  Patient also was referred to gastroenterology for colonoscopy.   03/26/2021 CEA 3.4.  CA125 9.7 04/04/2021, status post colonoscopy which showed a frond like /villous nonobstructing large mass was found in the cecum, this was biopsied.  Terminal ileum was briefly intubated and appeared normal.  Diverticulosis in the sigmoid colon.  The rectum, sigmoid colon, descending colon, transverse colon and ascending colon were normal.  Nonbleeding internal hemorrhoids. 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 Given that the biopsy may not be representative of the entire underlying lesion.   Patient was recommended to establish with surgeon for evaluation. Patient no showed for her surgery appointment and as well as her follow-up appointment with gastroenterology.  Per daughter, patient was having cervical spine surgery and she prioritized that over the colon surgery.   10/20/2021 - 01/07/2022 Chemotherapy    COLORECTAL 5-FU  + Bevacizumab q14d      10/20/2021 -  Chemotherapy   Patient is on Treatment Plan : COLORECTAL 5FU + Leucovorin (Modified [place]) + Bevacizumab q14d     12/22/2021 Imaging   CT chest abdomen pelvis Decreased cecum mass, however increased size and size of metastatic lesions throughout the liver.  Interval enlargement of vaginal cuff mass. No mets in chest.    01/21/2022 -  Chemotherapy   COLORECTAL FOLFOX  + Bevacizumab q14d    patient was seen by gynecology oncology Dr. Fransisca Connors.  Biopsy of the vaginal mass was not recommended.Patient's case was also discussed on multidisciplinary tumor board.  IR potentially can try a biopsy if patient is willing to.I had a discussion with patient's daughter over the phone prior to this visit.  We discussed about option of adding oxaliplatin to 5-FU/bevacizumab and continue treatment of colon cancer, repeat CT scan short-term and if progression, repeat biopsy versus proceeding with vaginal mass biopsy.  Daughter prefers proceeding with chemotherapy and defer biopsy.   01/08/2022 patient has had a second opinion at [facility] and was seen by Dr. Reynaldo Minium .  Dr. Reynaldo Minium agrees with the current treatment plan with  FOLFOX/bevacizumab.  He has ordered guardant 360 to see if she may be eligible for clinical trial in the future.  -   04/13/2022 Imaging    CT chest abdomen pelvis showed a cecal mass with stable hepatic metastasis.  Mixed cystic and solid mass in the region of the vaginal cuff, new area of patchy groundglass and consolidation in the right upper lobe, lingula and left lower lobe.  Likely due to pneumonia.  Hepatic steatosis.   Bilateral renal stones.  Aortic atherosclerosis   06/24/2022 Imaging   CT abdomen pelvis with contrast showed There are low-density lesions in liver consistent with metastatic disease with no significant change. Wall thickening in the cecum appears less prominent. There is 5.5 x 4.2 cm mixed density lesion in vaginal cuff suggesting possible metastatic disease.   There is no evidence of intestinal obstruction or pneumoperitoneum.There is no hydronephrosis.   Small patchy infiltrates are seen in the lower lung fields suggesting atelectasis/pneumonia.   Small bilateral renal stones. Diverticulosis of colon without signs of diverticulitis. 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 There is encroachment of neural foramina at L4-L5 and L5-S1 levels.     06/25/2022 Imaging   CT soft tissue neck with contrast showed1No acute process in the neck to explain the patient's difficulty swallowing.  CT chest contrast showed 1. Patchy areas of airspace opacity in the right upper lobe and left lower lobe and lingula slightly progressed since the prior radiograph and may represent worsening or recurrent pneumonia. Clinical correlation and follow-up to resolution recommended. 2. Aortic Atherosclerosis     06/24/2022 - 06/26/2022 patient was hospitalized due to difficulty swallowing, 1 day of abdominal pain.  Patient had extensive work-up including EGD which showed erosive gastritis.  No significant esophageal or gastric abnormality.  CT scan showed possible aspiration induced pneumonitis.  Antibiotics was held.  Today patient denies any fever, chills,.  Occasionally she has cough.  There is plan of possible relocation to [place] area next year.   INTERVAL HISTORY Chelsea [name] is a 77 y.o. female who has above history reviewed by me today presents for follow up visit for management of metastatic colon cancer Patient was accompanied by daughter + fatigue stable.  + appetite is  fair.  No nausea vomiting diarrhea   Right side pre-existing neuropathy . On gabapentin.  Left hand finger tip neuropathy intermittent.   Review of Systems  Constitutional:  Positive for fatigue. Negative for appetite change, chills, fever and unexpected weight change.  HENT:   Negative for hearing loss and voice change.   Eyes:  Negative for eye problems.  Respiratory:  Negative for chest tightness and cough.   Cardiovascular:  Negative for chest pain.  Gastrointestinal:  Negative for abdominal distention, abdominal pain, blood in stool and diarrhea.  Endocrine: Negative for hot flashes.  Genitourinary:  Negative for difficulty urinating and frequency.   Musculoskeletal:  Positive for back pain. Negative for arthralgias.  Skin:  Negative for itching and rash.  Neurological:  Positive for numbness (chornic finger tips). Negative for extremity weakness.  Hematological:  Negative for adenopathy.  Psychiatric/Behavioral:  Negative for confusion.     MEDICAL HISTORY:  Past Medical History:  Diagnosis Date   Anxiety    Arthritis    Depression    Hyperlipidemia    Hypertension    Liver mass    Metastatic colon cancer to liver ([place])    Moderate mitral insufficiency    Port-A-Cath in place    Seizures (  [place])    Spinal stenosis    Ulcer    Urinary incontinence     SURGICAL HISTORY: Past Surgical History:  Procedure Laterality Date   ABDOMINAL HYSTERECTOMY     BACK SURGERY     due to polio   BREAST BIOPSY Bilateral    neg   BREAST BIOPSY Right 2011   neg/stereo   CARPAL TUNNEL RELEASE     CATARACT EXTRACTION Right 2020   COLONOSCOPY WITH PROPOFOL N/A 04/04/2021   Procedure: COLONOSCOPY WITH PROPOFOL;  Surgeon: Virgel Manifold, MD;  Location: [facility] ENDOSCOPY;  Service: Endoscopy;  Laterality: N/A;   ESOPHAGOGASTRODUODENOSCOPY (EGD) WITH PROPOFOL N/A 06/25/2022   Procedure: ESOPHAGOGASTRODUODENOSCOPY (EGD) WITH PROPOFOL;  Surgeon: Lin Landsman, MD;  Location: [facility]  ENDOSCOPY;  Service: Gastroenterology;  Laterality: N/A;   EYE SURGERY     IR IMAGING GUIDED PORT INSERTION  10/09/2021    SOCIAL HISTORY: Social History   Socioeconomic History   Marital status: Divorced    Spouse name: Not on file   Number of children: Not on file   Years of education: Not on file   Highest education level: Not on file  Occupational History   Not on file  Tobacco Use   Smoking status: Never   Smokeless tobacco: Never  Vaping Use   Vaping Use: Never used  Substance and Sexual Activity   Alcohol use: Never   Drug use: No   Sexual activity: Not Currently  Other Topics Concern   Not on file  Social History Narrative   Not on file   Social Determinants of Health   Financial Resource Strain: Low Risk  (01/12/2022)   Overall Financial Resource Strain (CARDIA)    Difficulty of Paying Living Expenses: Not very hard  Food Insecurity: No Food Insecurity (06/24/2022)   Hunger Vital Sign    Worried About Running Out of Food in the Last Year: Never true    Ran Out of Food in the Last Year: Never true  Transportation Needs: No Transportation Needs (06/24/2022)   PRAPARE - Hydrologist (Medical): No    Lack of Transportation (Non-Medical): No  Physical Activity: Inactive (01/12/2022)   Exercise Vital Sign    Days of Exercise per Week: 0 days    Minutes of Exercise per Session: 0 min  Stress: Stress Concern Present (01/12/2022)   [place]    Feeling of Stress : To some extent  Social Connections: Moderately Integrated (01/12/2022)   Social Connection and Isolation Panel [NHANES]    Frequency of Communication with Friends and Family: Three times a week    Frequency of Social Gatherings with Friends and Family: Three times a week    Attends Religious Services: 1 to 4 times per year    Active Member of Clubs or Organizations: No    Attends Archivist Meetings: 1 to  4 times per year    Marital Status: Widowed  Intimate Partner Violence: Not At Risk (06/24/2022)   Humiliation, Afraid, Rape, and Kick questionnaire    Fear of Current or Ex-Partner: No    Emotionally Abused: No    Physically Abused: No    Sexually Abused: No    FAMILY HISTORY: Family History  Problem Relation Age of Onset   Arthritis Mother    Heart disease Mother    Stroke Mother    Hypertension Mother    COPD Mother    Sudden death Sister  Other Father        unknown medical history   Breast cancer Neg Hx     ALLERGIES:  is allergic to penicillins.  MEDICATIONS:  Current Outpatient Medications  Medication Sig Dispense Refill   acetaminophen (TYLENOL) 500 MG tablet Take 500 mg by mouth every 6 (six) hours as needed.     ALPRAZolam (XANAX) 0.5 MG tablet TAKE 1 TABLET BY MOUTH TWICE A DAY AS NEEDED FOR ANXIETY 10.11.23 60 tablet 0   atorvastatin (LIPITOR) 10 MG tablet TAKE 1 TABLET BY MOUTH EVERY DAY 90 tablet 0   busPIRone (BUSPAR) 5 MG tablet TAKE 1 TABLET BY MOUTH THREE TIMES A DAY 270 tablet 0   Cyanocobalamin (VITAMIN B 12 PO) Take 500 mcg by mouth daily.     diclofenac Sodium (VOLTAREN) 1 % GEL Apply 4 g topically 4 (four) times daily. 4 g 0   docusate sodium (COLACE) 100 MG capsule Take 1 capsule (100 mg total) by mouth 2 (two) times daily. 60 capsule 0   FLUoxetine (PROZAC) 20 MG capsule TAKE 3 CAPSULES (60 MG TOTAL) BY MOUTH EVERY MORNING. 270 capsule 0   gabapentin (NEURONTIN) 100 MG capsule Take 2 capsules (200 mg total) by mouth 3 (three) times daily. 180 capsule 1   lidocaine-prilocaine (EMLA) cream Apply small amount of cream to port site 1-2 hour prior to chemo treatment. 30 g 6   loperamide (IMODIUM) 2 MG capsule TAKE 1 CAP BY MOUTH SEE ADMIN INSTRUCTIONS. INITIAL: 4 MG, FOLLOWED BY 2 MG AFTER EACH LOOSE STOOL MAXIMUM: 16 MG/DAY 60 capsule 0   loratadine (CLARITIN) 10 MG tablet Take 1 tablet (10 mg total) by mouth See admin instructions. Take 1 tablet daily  for 4 days after each chemotherapy treatments. 90 tablet 0   meclizine (ANTIVERT) 12.5 MG tablet Take 1 tablet (12.5 mg total) by mouth 3 (three) times daily as needed for dizziness. 30 tablet 0   methocarbamol (ROBAXIN) 500 MG tablet TAKE 1 TABLET TWICE DAILY AS NEEDED FOR MUSCLE SPASM(S) 180 tablet 0   ondansetron (ZOFRAN) 8 MG tablet Take 1 tablet (8 mg total) by mouth 2 (two) times daily as needed (Nausea or vomiting). 30 tablet 1   polyethylene glycol powder (GLYCOLAX/MIRALAX) 17 GM/SCOOP powder Take 17 g by mouth daily. 3350 g 1   potassium chloride SA (KLOR-CON M) 20 MEQ tablet Take 2 tablets (40 mEq total) by mouth daily. 90 tablet 0   sucralfate (CARAFATE) 1 GM/10ML suspension Take 10 mLs (1 g total) by mouth 4 (four) times daily -  with meals and at bedtime. 420 mL 0   triamterene-hydrochlorothiazide (MAXZIDE-25) 37.5-25 MG tablet TAKE 1 TABLET EVERY DAY 90 tablet 0   No current facility-administered medications for this visit.   Facility-Administered Medications Ordered in Other Visits  Medication Dose Route Frequency Provider Last Rate Last Admin   heparin lock flush 100 UNIT/ML injection            palonosetron (ALOXI) 0.25 MG/5ML injection            prochlorperazine (COMPAZINE) 10 MG tablet              PHYSICAL EXAMINATION: ECOG PERFORMANCE STATUS: 2 - Symptomatic, <50% confined to bed Vitals:   10/06/22 0857  BP: (!) 102/58  Pulse: 77  Temp: (!) 96 F (35.6 C)  SpO2: 98%    Filed Weights   10/06/22 0857  Weight: 143 lb 6.4 oz (65 kg)      Physical Exam Constitutional:  General: She is not in acute distress.    Comments: Patient sits in wheelchair  HENT:     Head: Normocephalic and atraumatic.  Eyes:     General: No scleral icterus. Cardiovascular:     Rate and Rhythm: Normal rate and regular rhythm.     Heart sounds: Normal heart sounds.  Pulmonary:     Effort: Pulmonary effort is normal. No respiratory distress.     Breath sounds: No wheezing.   Abdominal:     General: Bowel sounds are normal. There is no distension.     Palpations: Abdomen is soft.  Musculoskeletal:        General: No deformity. Normal range of motion.     Cervical back: Normal range of motion and neck supple.  Skin:    General: Skin is warm and dry.     Findings: No erythema or rash.  Neurological:     Mental Status: She is alert and oriented to person, place, and time. Mental status is at baseline.     Cranial Nerves: No cranial nerve deficit.     Coordination: Coordination normal.  Psychiatric:        Mood and Affect: Mood normal.     LABORATORY DATA:  I have reviewed the data as listed     Latest Ref Rng & Units 10/06/2022    8:45 AM 09/21/2022    8:57 AM 09/07/2022    8:57 AM  CBC  WBC 4.0 - 10.5 K/uL 3.0  4.1  12.0   Hemoglobin 12.0 - 15.0 g/dL 9.9  9.8  10.3   Hematocrit 36.0 - 46.0 % 31.7  30.8  32.9   Platelets 150 - 400 K/uL 144  129  144       Latest Ref Rng & Units 10/06/2022    8:45 AM 09/21/2022    8:57 AM 09/07/2022    8:57 AM  CMP  Glucose 70 - 99 mg/dL 119  127  141   BUN 8 - 23 mg/dL _0 Creatinine 0.44 - 1.00 mg/dL 0.86  0.76  0.80   Sodium 135 - 145 mmol/L 138  140  141   Potassium 3.5 - 5.1 mmol/L 3.5  3.1  3.3   Chloride 98 - 111 mmol/L 104  104  105   CO2 22 - 32 mmol/L _1 Calcium 8.9 - 10.3 mg/dL 9.0  8.9  9.0   Total Protein 6.5 - 8.1 g/dL 6.9  7.1  6.8   Total Bilirubin 0.3 - 1.2 mg/dL 0.8  0.8  0.5   Alkaline Phos 38 - 126 U/L 61  66  122   AST 15 - 41 U/L _2 ALT 0 - 44 U/L _3 RADIOGRAPHIC STUDIES: I have personally reviewed the radiological images as listed and agreed with the findings in the report. MM DIAG BREAST TOMO UNI LEFT  Result Date: 07/31/2022 CLINICAL DATA:  The lung 77 year old female recalled from screening mammogram dated 07/09/2022 for a possible left breast mass. EXAM: DIGITAL DIAGNOSTIC UNILATERAL LEFT MAMMOGRAM WITH TOMOSYNTHESIS; ULTRASOUND LEFT  BREAST LIMITED TECHNIQUE: Left digital diagnostic mammography and breast tomosynthesis was performed.; Targeted ultrasound examination of the left breast was performed. COMPARISON:  Previous exam(s). ACR Breast Density Category b: There are scattered areas of fibroglandular density. FINDINGS: There is a persistent oval, circumscribed equal density mass in the upper outer  left breast at mid depth. Note is made of associated coarse calcifications. Targeted ultrasound is performed, showing an oval, circumscribed hypoechoic mass with internal calcifications at the 2 o'clock position 10 cm from the nipple. It measures 8 x 4 x 7 mm without significant internal vascularity. This correlates well with the mammographic finding. 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 Recommend short-term follow-up. RECOMMENDATION: Left breast ultrasound in 6 months. I have discussed the findings and recommendations with the patient. If applicable, a reminder letter will be sent to the patient regarding the next appointment. BI-RADS CATEGORY  3: Probably benign. Electronically Signed   By: Kristopher Oppenheim M.D.   On: 07/31/2022 12:06  US BREAST LTD UNI LEFT INC AXILLA  Result Date: 07/31/2022 CLINICAL DATA:  The lung 77 year old female recalled from screening mammogram dated 07/09/2022 for a possible left breast mass. EXAM: DIGITAL DIAGNOSTIC UNILATERAL LEFT MAMMOGRAM WITH TOMOSYNTHESIS; ULTRASOUND LEFT BREAST LIMITED TECHNIQUE: Left digital diagnostic mammography and breast tomosynthesis was performed.; Targeted ultrasound examination of the left breast was performed. COMPARISON:  Previous exam(s). ACR Breast Density Category b: There are scattered areas of fibroglandular density. FINDINGS: There is a persistent oval, circumscribed equal density mass in the upper outer left breast at mid depth. Note is made of associated coarse calcifications. Targeted ultrasound is performed, showing  an oval, circumscribed hypoechoic mass with internal calcifications at the 2 o'clock position 10 cm from the nipple. It measures 8 x 4 x 7 mm without significant internal vascularity. This correlates well with the mammographic finding. 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 Recommend short-term follow-up. RECOMMENDATION: Left breast ultrasound in 6 months. I have discussed the findings and recommendations with the patient. If applicable, a reminder letter will be sent to the patient regarding the next appointment. BI-RADS CATEGORY  3: Probably benign. Electronically Signed   By: Kristopher Oppenheim M.D.   On: 07/31/2022 12:06  DG Bone Density  Result Date: 07/13/2022 EXAM: DUAL X-RAY ABSORPTIOMETRY (DXA) FOR BONE MINERAL DENSITY IMPRESSION: Your patient Wyvonne Carda completed a BMD test on 07/09/2022 using the [employer] (software version: 14.10) manufactured by [employer]. The following summarizes the results of our evaluation. Technologist:VLM PATIENT BIOGRAPHICAL: Name: Zeta, Bucy Patient ID: [id number] Birth Date: Jun 01, 1946 Height: 57.0 in. Gender: Female Exam Date: 07/09/2022 Weight: 148.0 lbs. Indications: History of kidney disease, Hysterectomy, Postmenopausal Fractures: Treatments: Protonix DENSITOMETRY RESULTS: Site         Region     Measured Date Measured Age WHO Classification Young Adult T-score BMD         %Change vs. Previous Significant Change (*) AP Spine L2-L4 07/09/2021 77.1 Normal 3.8 1.683 g/cm2 - - DualFemur Neck Left 07/09/2021 77.1 Normal 0.8 1.144 g/cm2 - - Left Forearm Radius 33% 07[age]09/2021 77.1 Normal 1.6 1.020 g/cm2 - - ASSESSMENT: The BMD measured at Femur Neck Left is 1.144 g/cm2 with a T-score of 0.8. This patient is considered normal according to [facility]) criteria. L-1 was excluded due to degenerative changes. The scan quality is good. World Pharmacologist [facility])  criteria for post-menopausal, Caucasian Women: Normal:                   T-score at or above -1 SD Osteopenia/low bone mass: T-score between -1 and -2.5 SD Osteoporosis:             T-score at or below -2.5 SD RECOMMENDATIONS: 1. All patients should  optimize calcium and vitamin D intake. 2. Consider FDA-approved medical therapies in postmenopausal women and men aged 29 years and older, based on the following: a. A hip or vertebral(clinical or morphometric) fracture b. T-score < -2.5 at the femoral neck or spine after appropriate evaluation to exclude secondary causes c. Low bone mass (T-score between -1.0 and -2.5 at the femoral neck or spine) and a 10-year probability of a hip fracture > 3% or a 10-year probability of a major osteoporosis-related fracture > 20% based on the [place]-adapted WHO algorithm 3. Clinician judgment and/or patient preferences may indicate treatment for people with 10-year fracture probabilities above or below these levels FOLLOW-UP: People with diagnosed cases of osteoporosis or at high risk for fracture should have regular bone mineral density tests. For patients eligible for Medicare, routine testing is allowed once every 2 years. The testing frequency can be increased to one year for patients who have rapidly progressing disease, those who are receiving or discontinuing medical therapy to restore bone mass, or have additional risk factors. I have reviewed this report, and agree with the above findings. [facility] Electronically Signed   By: Zerita Boers M.D.   On: 07/13/2022 07:57   MM 3D SCREEN BREAST BILATERAL  Result Date: 07/10/2022 CLINICAL DATA:  Screening. EXAM: DIGITAL SCREENING BILATERAL MAMMOGRAM WITH TOMOSYNTHESIS AND CAD TECHNIQUE: Bilateral screening digital craniocaudal and mediolateral oblique mammograms were obtained. Bilateral screening digital breast tomosynthesis was performed. The images were evaluated with computer-aided detection. COMPARISON:   Previous exam(s). ACR Breast Density Category b: There are scattered areas of fibroglandular density. FINDINGS: In the left breast, a possible mass warrants further evaluation. In the right breast, no findings suspicious for malignancy. IMPRESSION: Further evaluation is suggested for a possible mass in the left breast. RECOMMENDATION: Diagnostic mammogram and possibly ultrasound of the left breast. (Code:[id number]) The patient will be contacted regarding the findings, and additional imaging will be scheduled. BI-RADS CATEGORY  0: Incomplete. Need additional imaging evaluation and/or prior mammograms for comparison. Electronically Signed   By: Kristopher Oppenheim M.D.   On: 07/10/2022 13:15

## 2022-10-06 NOTE — Assessment & Plan Note (Signed)
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  Bilateral fingertips, pre-existing right side neuropathy is slightly worse. Left side is intermittent.  gabapentin to 200 mg 3 times daily. She declines acupuncture referral.  Oxaliplatin has been discontinued  Referred to neurology for management.

## 2022-10-06 NOTE — Assessment & Plan Note (Addendum)
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  No sufficient tissue for NGS Liquid biopsy showed KRAS12V, APC, TP53 mutation, TMB 4.  S/p 6 cycles of 5-FU /Bevacizumab--> CT showed mixed response--> FOLFOX/bevacizumab.-->CT imaging shows stable disease --> dc oxaliplatin, 5-FU/Bev maintenance 09/07/22 Labs reviewed and discussed with patient Proceed with 5-FU/bevacizumab.[Oxaliplatin dc due to neuropathy]  If her disease progresses, consider adding irinotecan if she is able to tolerate. CT next week.   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  Stable disease.

## 2022-10-06 NOTE — Assessment & Plan Note (Signed)
Hemoglobin is stable.  Continue monitor.   

## 2022-10-06 NOTE — Assessment & Plan Note (Signed)
We discussed about appetite stimulant Marinol.   Patient would like to defer for now and try nutrition supplements breast.  Continue follow-up with nutritionist 

## 2022-10-06 NOTE — Progress Notes (Signed)
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  Patient receiving folfox/bevacizumab.  Met with patient during infusion.  Patient reports that her appetite is doing pretty good.  Has been drinking ensure, eating yogurt, cottage cheese.  Enjoyed beef stew from [employer] last night for dinner.     Medications: reviewed  Labs: glucose 119  Anthropometrics:   Weight 143 lb 6.4 oz today  146 lb on 9/27 157 lb on 8/23 161 lb on 8/9 171 lb on 6/7 178 lb on 4/19  NUTRITION DIAGNOSIS: Inadequate oral intake stable   INTERVENTION:  Patient declined appetite stimulant, per MD note Encouraged oral nutrition supplement Reviewed ways to add calories and protein in diet.     MONITORING, EVALUATION, GOAL: weight trends, intake   NEXT VISIT: ~6 weeks during infusion  Chelsea [name], RD, LDN Registered Dietitian [phone]   

## 2022-10-06 NOTE — Assessment & Plan Note (Signed)
Chemotherapy plan as listed above 

## 2022-10-07 LAB — CEA: CEA: 3.3 ng/mL (ref 0.0–4.7)

## 2022-10-08 ENCOUNTER — Inpatient Hospital Stay: Payer: Medicare HMO

## 2022-10-08 ENCOUNTER — Encounter: Payer: Self-pay | Admitting: Oncology

## 2022-10-08 VITALS — BP 101/62 | HR 74 | Resp 18

## 2022-10-08 DIAGNOSIS — D6481 Anemia due to antineoplastic chemotherapy: Secondary | ICD-10-CM | POA: Diagnosis not present

## 2022-10-08 DIAGNOSIS — D701 Agranulocytosis secondary to cancer chemotherapy: Secondary | ICD-10-CM | POA: Diagnosis not present

## 2022-10-08 DIAGNOSIS — Z79899 Other long term (current) drug therapy: Secondary | ICD-10-CM | POA: Diagnosis not present

## 2022-10-08 DIAGNOSIS — Z5111 Encounter for antineoplastic chemotherapy: Secondary | ICD-10-CM | POA: Diagnosis not present

## 2022-10-08 DIAGNOSIS — C180 Malignant neoplasm of cecum: Secondary | ICD-10-CM | POA: Diagnosis not present

## 2022-10-08 DIAGNOSIS — Z5112 Encounter for antineoplastic immunotherapy: Secondary | ICD-10-CM | POA: Diagnosis not present

## 2022-10-08 DIAGNOSIS — C189 Malignant neoplasm of colon, unspecified: Secondary | ICD-10-CM

## 2022-10-08 DIAGNOSIS — C787 Secondary malignant neoplasm of liver and intrahepatic bile duct: Secondary | ICD-10-CM | POA: Diagnosis not present

## 2022-10-08 MED ORDER — SODIUM CHLORIDE 0.9% FLUSH
10.0000 mL | INTRAVENOUS | Status: DC | PRN
  Administered 2022-10-08: 10 mL
  Filled 2022-10-08: qty 10

## 2022-10-08 MED ORDER — HEPARIN SOD (PORK) LOCK FLUSH 100 UNIT/ML IV SOLN
500.0000 [IU] | Freq: Once | INTRAVENOUS | Status: AC | PRN
  Administered 2022-10-08: 500 [IU]
  Filled 2022-10-08: qty 5

## 2022-10-09 ENCOUNTER — Telehealth: Payer: Self-pay | Admitting: Internal Medicine

## 2022-10-09 NOTE — Telephone Encounter (Signed)
Left message for patient to call back and schedule the Medicare Annual Wellness Visit (AWV) virtually or by telephone.  Last AWV 10/28/21  Please schedule at anytime with [place].   Any questions, please call me at [phone]

## 2022-10-13 ENCOUNTER — Ambulatory Visit
Admission: RE | Admit: 2022-10-13 | Discharge: 2022-10-13 | Disposition: A | Discharge status: Home / Self Care | Payer: Medicare HMO | Source: Ambulatory Visit | Attending: Oncology | Admitting: Oncology

## 2022-10-13 DIAGNOSIS — C787 Secondary malignant neoplasm of liver and intrahepatic bile duct: Secondary | ICD-10-CM | POA: Diagnosis not present

## 2022-10-13 DIAGNOSIS — C189 Malignant neoplasm of colon, unspecified: Secondary | ICD-10-CM | POA: Diagnosis not present

## 2022-10-13 MED ORDER — IOHEXOL 300 MG/ML  SOLN
100.0000 mL | Freq: Once | INTRAMUSCULAR | Status: AC | PRN
  Administered 2022-10-13: 100 mL via INTRAVENOUS

## 2022-10-14 ENCOUNTER — Encounter: Payer: Self-pay | Admitting: Internal Medicine

## 2022-10-14 ENCOUNTER — Ambulatory Visit (INDEPENDENT_AMBULATORY_CARE_PROVIDER_SITE_OTHER): Payer: Medicare HMO | Admitting: Internal Medicine

## 2022-10-14 VITALS — BP 118/68 | HR 90 | Temp 96.8°F | Wt 142.0 lb

## 2022-10-14 DIAGNOSIS — D6481 Anemia due to antineoplastic chemotherapy: Secondary | ICD-10-CM

## 2022-10-14 DIAGNOSIS — I10 Essential (primary) hypertension: Secondary | ICD-10-CM

## 2022-10-14 DIAGNOSIS — N3946 Mixed incontinence: Secondary | ICD-10-CM | POA: Diagnosis not present

## 2022-10-14 DIAGNOSIS — G4733 Obstructive sleep apnea (adult) (pediatric): Secondary | ICD-10-CM | POA: Diagnosis not present

## 2022-10-14 DIAGNOSIS — D696 Thrombocytopenia, unspecified: Secondary | ICD-10-CM

## 2022-10-14 DIAGNOSIS — R7309 Other abnormal glucose: Secondary | ICD-10-CM

## 2022-10-14 DIAGNOSIS — R600 Localized edema: Secondary | ICD-10-CM

## 2022-10-14 DIAGNOSIS — C787 Secondary malignant neoplasm of liver and intrahepatic bile duct: Secondary | ICD-10-CM

## 2022-10-14 DIAGNOSIS — M4807 Spinal stenosis, lumbosacral region: Secondary | ICD-10-CM

## 2022-10-14 DIAGNOSIS — E7800 Pure hypercholesterolemia, unspecified: Secondary | ICD-10-CM

## 2022-10-14 DIAGNOSIS — F419 Anxiety disorder, unspecified: Secondary | ICD-10-CM | POA: Diagnosis not present

## 2022-10-14 DIAGNOSIS — C189 Malignant neoplasm of colon, unspecified: Secondary | ICD-10-CM | POA: Diagnosis not present

## 2022-10-14 DIAGNOSIS — F32A Depression, unspecified: Secondary | ICD-10-CM

## 2022-10-14 DIAGNOSIS — I700 Atherosclerosis of aorta: Secondary | ICD-10-CM | POA: Diagnosis not present

## 2022-10-14 DIAGNOSIS — R739 Hyperglycemia, unspecified: Secondary | ICD-10-CM

## 2022-10-14 DIAGNOSIS — E6609 Other obesity due to excess calories: Secondary | ICD-10-CM

## 2022-10-14 DIAGNOSIS — Z6830 Body mass index (BMI) 30.0-30.9, adult: Secondary | ICD-10-CM

## 2022-10-14 MED ORDER — TRIAMTERENE-HCTZ 37.5-25 MG PO TABS: 1.0000 | ORAL_TABLET | Freq: Every day | ORAL | 0 refills | Status: DC

## 2022-10-14 MED ORDER — ASPIRIN 81 MG PO TBEC: 81.0000 mg | DELAYED_RELEASE_TABLET | Freq: Every day | ORAL | 12 refills | Status: DC

## 2022-10-14 NOTE — Assessment & Plan Note (Signed)
Continue Tylenol, gabapentin, Lidoderm patches and Voltaren gel Encouraged regular stretching

## 2022-10-14 NOTE — Assessment & Plan Note (Signed)
She will continue to wear pads

## 2022-10-14 NOTE — Assessment & Plan Note (Signed)
C-Met and lipid profile today Encouraged her to consume low-fat diet Continue atorvastatin 

## 2022-10-14 NOTE — Assessment & Plan Note (Signed)
Noncompliant with CPAP Encouraged weight loss as this can produce sleep apnea symptoms

## 2022-10-14 NOTE — Assessment & Plan Note (Signed)
Lipid profile today Encouraged her to consume low-fat diet Continue atorvastatin We will have her start baby aspirin

## 2022-10-14 NOTE — Assessment & Plan Note (Signed)
Controlled on triamterene HCT Kidney function reviewed Reinforced DASH diet and exercise weight loss

## 2022-10-14 NOTE — Progress Notes (Signed)
Subjective:    Patient ID: Chelsea [name], female    DOB: [birth year], 77 y.o.   MRN: [id number]  HPI  Patient presents to clinic today for 10-monthfollow-up of chronic conditions.  HTN: Her BP today is 118/68.  She is taking Triamterene HCT as prescribed.  ECG from 06/2022 reviewed.  HLD with Aortic Atherosclerosis: Her last LDL was 79, triglycerides 68, 04/2022.  She denies myalgias on Atorvastatin.  She is not taking any Aspirin.  She tries to consume a low-fat diet.  Anxiety and Depression: Chronic, managed on Fluoxetine, Buspirone and Xanax.  She is not currently seeing a therapist.  She denies SI/HI.  Cervical Myelopathy/Lumbar Spinal Stenosis: Status post cervical surgery.  She is taking Tylenol, Voltaren gel, Lidoderm patches and Gabapentin as prescribed.  She follows with neurosurgery.  OSA: She averages hours of sleep per night without the use of her CPAP. There is no sleep study on file.  Mixed Incontinence: Managed with pads. She does not follow with urology.  Iron Deficiency Anemia: Her last H/H was 9.9/31.7, 10/2022.  She is not taking any oral Iron OTC.  She does follow with hematology.  Metastatic Colon Cancer: She is undergoing chemotherapy.  She follows with oncology.  Leukopenia: Her last WBC count was 3, 10/2022.  She follows with oncology.  Thrombocytopenia: Her last platelet count was 144, 1/24.  She follows with oncology.  Review of Systems     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)    Moderate mitral insufficiency    Port-A-Cath in place    Seizures ([facility]    Spinal stenosis    Ulcer    Urinary incontinence     Current Outpatient Medications  Medication Sig Dispense Refill   acetaminophen (TYLENOL) 500 MG tablet Take 500 mg by mouth every 6 (six) hours as needed.     ALPRAZolam (XANAX) 0.5 MG tablet TAKE 1 TABLET BY  MOUTH TWICE A DAY AS NEEDED FOR ANXIETY 10.11.23 60 tablet 0   atorvastatin (LIPITOR) 10 MG tablet TAKE 1 TABLET BY MOUTH EVERY DAY 90 tablet 0   busPIRone (BUSPAR) 5 MG tablet TAKE 1 TABLET BY MOUTH THREE TIMES A DAY 270 tablet 0   Cyanocobalamin (VITAMIN B 12 PO) Take 500 mcg by mouth daily.     diclofenac Sodium (VOLTAREN) 1 % GEL Apply 4 g topically 4 (four) times daily. 4 g 0   docusate sodium (COLACE) 100 MG capsule Take 1 capsule (100 mg total) by mouth 2 (two) times daily. 60 capsule 0   FLUoxetine (PROZAC) 20 MG capsule TAKE 3 CAPSULES (60 MG TOTAL) BY MOUTH EVERY MORNING. 270 capsule 0   gabapentin (NEURONTIN) 100 MG capsule Take 2 capsules (200 mg total) by mouth 3 (three) times daily. 180 capsule 1   lidocaine-prilocaine (EMLA) cream Apply small amount of cream to port site 1-2 hour prior to chemo treatment. 30 g 6   loperamide (IMODIUM) 2 MG capsule TAKE 1 CAP BY MOUTH SEE ADMIN INSTRUCTIONS. INITIAL: 4 MG, FOLLOWED BY 2 MG AFTER EACH LOOSE STOOL MAXIMUM: 16 MG/DAY 60 capsule 0   loratadine (CLARITIN) 10 MG tablet Take 1 tablet (10 mg total) by mouth See admin instructions. Take 1 tablet daily for 4 days after each chemotherapy treatments. 90 tablet 0   meclizine (ANTIVERT) 12.5  MG tablet Take 1 tablet (12.5 mg total) by mouth 3 (three) times daily as needed for dizziness. 30 tablet 0   methocarbamol (ROBAXIN) 500 MG tablet TAKE 1 TABLET TWICE DAILY AS NEEDED FOR MUSCLE SPASM(S) 180 tablet 0   ondansetron (ZOFRAN) 8 MG tablet Take 1 tablet (8 mg total) by mouth 2 (two) times daily as needed (Nausea or vomiting). 30 tablet 1   polyethylene glycol powder (GLYCOLAX/MIRALAX) 17 GM/SCOOP powder Take 17 g by mouth daily. 3350 g 1   potassium chloride SA (KLOR-CON M) 20 MEQ tablet Take 2 tablets (40 mEq total) by mouth daily. 90 tablet 0   sucralfate (CARAFATE) 1 GM/10ML suspension Take 10 mLs (1 g total) by mouth 4 (four) times daily -  with meals and at bedtime. 420 mL 0    triamterene-hydrochlorothiazide (MAXZIDE-25) 37.5-25 MG tablet TAKE 1 TABLET EVERY DAY 90 tablet 0   No current facility-administered medications for this visit.   Facility-Administered Medications Ordered in Other Visits  Medication Dose Route Frequency Provider Last Rate Last Admin   heparin lock flush 100 UNIT/ML injection            palonosetron (ALOXI) 0.25 MG/5ML injection            prochlorperazine (COMPAZINE) 10 MG tablet             Allergies  Allergen Reactions   Penicillins     "Everything turned black"    Family History  Problem Relation Age of Onset   Arthritis Mother    Heart disease Mother    Stroke Mother    Hypertension Mother    COPD Mother    Sudden death Sister    Other Father        unknown medical history   Breast cancer Neg Hx     Social History   Socioeconomic History   Marital status: Divorced    Spouse name: Not on file   Number of children: Not on file   Years of education: Not on file   Highest education level: Not on file  Occupational History   Not on file  Tobacco Use   Smoking status: Never   Smokeless tobacco: Never  Vaping Use   Vaping Use: Never used  Substance and Sexual Activity   Alcohol use: Never   Drug use: No   Sexual activity: Not Currently  Other Topics Concern   Not on file  Social History Narrative   Not on file   Social Determinants of Health   Financial Resource Strain: Low Risk  (01/12/2022)   Overall Financial Resource Strain (CARDIA)    Difficulty of Paying Living Expenses: Not very hard  Food Insecurity: No Food Insecurity (06/24/2022)   Hunger Vital Sign    Worried About Running Out of Food in the Last Year: Never true    Ran Out of Food in the Last Year: Never true  Transportation Needs: No Transportation Needs (06/24/2022)   PRAPARE - Hydrologist (Medical): No    Lack of Transportation (Non-Medical): No  Physical Activity: Inactive (01/12/2022)   Exercise Vital Sign     Days of Exercise per Week: 0 days    Minutes of Exercise per Session: 0 min  Stress: Stress Concern Present (01/12/2022)   [place]    Feeling of Stress : To some extent  Social Connections: Moderately Integrated (01/12/2022)   Social Connection and Isolation Panel [NHANES]  Frequency of Communication with Friends and Family: Three times a week    Frequency of Social Gatherings with Friends and Family: Three times a week    Attends Religious Services: 1 to 4 times per year    Active Member of Clubs or Organizations: No    Attends Archivist Meetings: 1 to 4 times per year    Marital Status: Widowed  Intimate Partner Violence: Not At Risk (06/24/2022)   Humiliation, Afraid, Rape, and Kick questionnaire    Fear of Current or Ex-Partner: No    Emotionally Abused: No    Physically Abused: No    Sexually Abused: No     Constitutional: Denies fever, malaise, fatigue, headache or abrupt weight changes.  HEENT: Denies eye pain, eye redness, ear pain, ringing in the ears, wax buildup, runny nose, nasal congestion, bloody nose, or sore throat. Respiratory: Denies difficulty breathing, shortness of breath, cough or sputum production.   Cardiovascular: Denies chest pain, chest tightness, palpitations or swelling in the hands or feet.  Gastrointestinal: Pt reports intermittent diarrhea. Denies abdominal pain, bloating, constipation, or blood in the stool.  GU: Pt reports frequency and urgency. Denies pain with urination, burning sensation, blood in urine, odor or discharge. Musculoskeletal: Pt reports intermittent neck pain. Denies decrease in range of motion, difficulty with gait, muscle pain or joint  swelling.  Skin: Denies redness, rashes, lesions or ulcercations.  Neurological: Pt reports paresthesia of right upper extremity. Denies dizziness, difficulty with memory, difficulty with speech or problems with balance and  coordination.  Psych: Patient has a history of anxiety and depression.  Denies SI/HI.  No other specific complaints in a complete review of systems (except as listed in HPI above).  Objective:   Physical Exam  BP 118/68 (BP Location: Left Arm, Patient Position: Sitting, Cuff Size: Normal)   Pulse 90   Temp (!) 96.8 F (36 C) (Temporal)   Wt 142 lb (64.4 kg)   SpO2 99%   BMI 30.73 kg/m   Wt Readings from Last 3 Encounters:  10/06/22 143 lb 6.4 oz (65 kg)  09/21/22 145 lb 3.2 oz (65.9 kg)  09/07/22 145 lb (65.8 kg)    General: Appears her stated age, chronically ill appearing,  in NAD. Skin: Warm, dry and intact.  HEENT: Head: normal shape and size; Eyes: sclera white, no icterus, conjunctiva pink, PERRLA and EOMs intact;  Cardiovascular: Normal rate and rhythm. S1,S2 noted.  No murmur, rubs or gallops noted. No JVD or BLE edema. No carotid bruits noted. Pulmonary/Chest: Normal effort and positive vesicular breath sounds. No respiratory distress. No wheezes, rales or ronchi noted.  Abdomen: Normal bowel sounds.  Musculoskeletal: Gait slow and steady with rolling walker. Neurological: Alert and oriented. Coordination normal.  Psychiatric: Mood and affect normal. Behavior is normal. Judgment and thought content normal.    BMET    Component Value Date/Time   NA 138 10/06/2022 0845   K 3.5 10/06/2022 0845   CL 104 10/06/2022 0845   CO2 27 10/06/2022 0845   GLUCOSE 119 (H) 10/06/2022 0845   BUN 13 10/06/2022 0845   CREATININE 0.86 10/06/2022 0845   CREATININE 0.87 08/13/2020 1129   CALCIUM 9.0 10/06/2022 0845   GFRNONAA >60 10/06/2022 0845   GFRNONAA 65 08/13/2020 1129   GFRAA 76 08/13/2020 1129    Lipid Panel     Component Value Date/Time   CHOL 148 04/28/2022 1105   TRIG 68 04/28/2022 1105   HDL 55 04/28/2022 1105   CHOLHDL  2.7 04/28/2022 1105   VLDL 18.4 02/08/2013 1638   LDLCALC 79 04/28/2022 1105    CBC    Component Value Date/Time   WBC 3.0 (L)  10/06/2022 0845   RBC 3.03 (L) 10/06/2022 0845   HGB 9.9 (L) 10/06/2022 0845   HCT 31.7 (L) 10/06/2022 0845   PLT 144 (L) 10/06/2022 0845   MCV 104.6 (H) 10/06/2022 0845   MCH 32.7 10/06/2022 0845   MCHC 31.2 10/06/2022 0845   RDW 17.1 (H) 10/06/2022 0845   LYMPHSABS 1.3 10/06/2022 0845   MONOABS 0.4 10/06/2022 0845   EOSABS 0.1 10/06/2022 0845   BASOSABS 0.0 10/06/2022 0845    Hgb A1C Lab Results  Component Value Date   HGBA1C 5.6 04/28/2022           Assessment & Plan:   No need to return, she is moving out of state. Webb Silversmith, NP

## 2022-10-14 NOTE — Assessment & Plan Note (Signed)
She will continue to follow with oncology. 

## 2022-10-14 NOTE — Assessment & Plan Note (Signed)
Having unintentional weight loss

## 2022-10-14 NOTE — Patient Instructions (Signed)

## 2022-10-14 NOTE — Assessment & Plan Note (Signed)
Continue fluoxetine, bupropion and Xanax Support offered

## 2022-10-14 NOTE — Assessment & Plan Note (Signed)
CBC reviewed She will continue to follow with hematology

## 2022-10-15 LAB — HEMOGLOBIN A1C
Hgb A1c MFr Bld: 5.4 % of total Hgb (ref <5.7)
Mean Plasma Glucose: 108 mg/dL
eAG (mmol/L): 6 mmol/L

## 2022-10-15 LAB — LIPID PANEL
Cholesterol: 171 mg/dL (ref <200)
HDL: 60 mg/dL (ref >=50)
LDL Cholesterol (Calc): 93 mg/dL (calc)
Non-HDL Cholesterol (Calc): 111 mg/dL (calc) (ref <130)
Total CHOL/HDL Ratio: 2.9 (calc) (ref <5.0)
Triglycerides: 90 mg/dL (ref <150)

## 2022-10-16 ENCOUNTER — Inpatient Hospital Stay (HOSPITAL_BASED_OUTPATIENT_CLINIC_OR_DEPARTMENT_OTHER): Payer: Medicare HMO | Admitting: Internal Medicine

## 2022-10-16 VITALS — BP 137/72 | HR 71 | Temp 95.1°F | Ht <= 58 in

## 2022-10-16 DIAGNOSIS — G620 Drug-induced polyneuropathy: Secondary | ICD-10-CM | POA: Insufficient documentation

## 2022-10-16 DIAGNOSIS — D6481 Anemia due to antineoplastic chemotherapy: Secondary | ICD-10-CM | POA: Diagnosis not present

## 2022-10-16 DIAGNOSIS — Z5112 Encounter for antineoplastic immunotherapy: Secondary | ICD-10-CM | POA: Diagnosis not present

## 2022-10-16 DIAGNOSIS — C787 Secondary malignant neoplasm of liver and intrahepatic bile duct: Secondary | ICD-10-CM | POA: Diagnosis not present

## 2022-10-16 DIAGNOSIS — Z5111 Encounter for antineoplastic chemotherapy: Secondary | ICD-10-CM | POA: Diagnosis not present

## 2022-10-16 DIAGNOSIS — Z79899 Other long term (current) drug therapy: Secondary | ICD-10-CM | POA: Diagnosis not present

## 2022-10-16 DIAGNOSIS — T451X5A Adverse effect of antineoplastic and immunosuppressive drugs, initial encounter: Secondary | ICD-10-CM

## 2022-10-16 DIAGNOSIS — C180 Malignant neoplasm of cecum: Secondary | ICD-10-CM

## 2022-10-16 DIAGNOSIS — D701 Agranulocytosis secondary to cancer chemotherapy: Secondary | ICD-10-CM | POA: Diagnosis not present

## 2022-10-16 MED ORDER — GABAPENTIN 300 MG PO CAPS: 300.0000 mg | ORAL_CAPSULE | Freq: Three times a day (TID) | ORAL | 3 refills | Status: DC

## 2022-10-16 NOTE — Progress Notes (Signed)
[facility] [place], [place] [address] [phone]   New Patient Evaluation  Date of Service: 10/16/22 Patient Name: Chelsea [name] Patient MRN: [id number] Patient DOB: [birth year] Provider: Ventura Sellers, MD  Identifying Statement:  Chelsea [name] is a 77 y.o. female with Chemotherapy-induced neuropathy [facility]) who presents for initial consultation and evaluation regarding cancer associated neurologic deficits.    Referring Provider: Jearld Fenton, NP [address] [place],  [place] [address]  Primary Cancer:  Oncologic History: Oncology History  Metastatic colon cancer to liver ([place])  08/21/2021 Imaging   CT abdomen pelvis with contrast  showed soft tissue mass at the base of cecum measuring up to 5 cm, slightly increased in size.  Interval development of multiple low-density lesions throughout the liver highly suggestive for metastatic disease.  Complex mass in the region of the vaginal cuff measuring up to 5.2 cm, increased in size.  Right ovary also appears more prominent on the current examination.  Moderate large volume of stool throughout the colon.  Mild urinary bladder wall thickening.  Colonic diverticulosis without evidence of active diverticulitis.  Aortic atherosclerosis   09/04/2021 Procedure   patient is status post left lobe liver lesion biopsy and pathology showed metastatic adenocarcinoma, compatible with colorectal primary.   09/04/2021 Cancer Staging   Staging form: Colon and Rectum, AJCC 8th Edition - Clinical stage from 09/04/2021: Stage Unknown (cTX, cNX, cM1) - Signed by [name], MD on 05/13/2022 Stage prefix: Initial diagnosis   09/16/2021 Initial Diagnosis   Metastatic colon cancer to liver [facility] one liquid biopsy testing showed KRASG12V, APC TP53 TMB 4  Patient initially went to emergency room on 03/24/2021 for abdominal pain. 03/24/2021, CT scan of the abdomen showed marked bladder wall  thickening with surrounding soft tissue stranding is noted.  Concerning for cystitis.  There is a lobulated mass between the posterior wall of the bladder and the rectum which appears to arise from the vaginal cuff.  This is indeterminate and difficult to characterize reflecting lack of IV contrast material.  Nonobstructing left renal calculus, left sacroiliitis, lumbar spondylosis aortic atherosclerosis. 03/24/2021 CT pelvic showed abdominal [place] soft tissue of right cecum, concerning for colon carcinoma.  Colonoscopy recommended.  Low-attenuation mass in the region of vaginal cuff is again noted.  Diffuse urinary bladder wall thickening and mild bilateral hydroureter possibly cystitis.   [place] 03/24/2021 Lobulated solid 3.8 cm mass confirmed by ultrasound. This is most compatible with a neoplasm but remains indeterminate as to the origin as the epicenter seems to be outside of both the vaginal cuff and the adjacent colon, while the lesion appears inseparable from both. Extensive echogenic debris within the distended urinary bladder.   03/26/2021, patient was seen by Dr. Theora Gianotti for evaluation of colonic/vaginal cuff pelvic mass.  Patient also was referred to gastroenterology for colonoscopy.   03/26/2021 CEA 3.4.  CA125 9.7 04/04/2021, status post colonoscopy which showed a frond like /villous nonobstructing large mass was found in the cecum, this was biopsied.  Terminal ileum was briefly intubated and appeared normal.  Diverticulosis in the sigmoid colon.  The rectum, sigmoid colon, descending colon, transverse colon and ascending colon were normal.  Nonbleeding internal hemorrhoids. 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 Given that the biopsy may not be representative of the entire underlying lesion.  Patient was recommended to establish with surgeon for evaluation. Patient no showed for her surgery appointment and as well as  her follow-up appointment with gastroenterology.   Per daughter, patient was having cervical spine surgery and she prioritized that over the colon surgery.   10/20/2021 - 01/07/2022 Chemotherapy    COLORECTAL 5-FU  + Bevacizumab q14d      10/20/2021 -  Chemotherapy   Patient is on Treatment Plan : COLORECTAL 5FU + Leucovorin (Modified [place]) + Bevacizumab q14d     12/22/2021 Imaging   CT chest abdomen pelvis Decreased cecum mass, however increased size and size of metastatic lesions throughout the liver.  Interval enlargement of vaginal cuff mass. No mets in chest.    01/21/2022 -  Chemotherapy   COLORECTAL FOLFOX  + Bevacizumab q14d    patient was seen by gynecology oncology Dr. Fransisca Connors.  Biopsy of the vaginal mass was not recommended.Patient's case was also discussed on multidisciplinary tumor board.  IR potentially can try a biopsy if patient is willing to.I had a discussion with patient's daughter over the phone prior to this visit.  We discussed about option of adding oxaliplatin to 5-FU/bevacizumab and continue treatment of colon cancer, repeat CT scan short-term and if progression, repeat biopsy versus proceeding with vaginal mass biopsy.  Daughter prefers proceeding with chemotherapy and defer biopsy.   01/08/2022 patient has had a second opinion at [facility] and was seen by Dr. Reynaldo Minium .  Dr. Reynaldo Minium agrees with the current treatment plan with FOLFOX/bevacizumab.  He has ordered guardant 360 to see if she may be eligible for clinical trial in the future.  -   04/13/2022 Imaging    CT chest abdomen pelvis showed a cecal mass with stable hepatic metastasis.  Mixed cystic and solid mass in the region of the vaginal cuff, new area of patchy groundglass and consolidation in the right upper lobe, lingula and left lower lobe.  Likely due to pneumonia.  Hepatic steatosis.  Bilateral renal stones.  Aortic atherosclerosis   06/24/2022 Imaging   CT abdomen pelvis with contrast showed There are low-density lesions in liver consistent with  metastatic disease with no significant change. Wall thickening in the cecum appears less prominent. There is 5.5 x 4.2 cm mixed density lesion in vaginal cuff suggesting possible metastatic disease.   There is no evidence of intestinal obstruction or pneumoperitoneum.There is no hydronephrosis.   Small patchy infiltrates are seen in the lower lung fields suggesting atelectasis/pneumonia.   Small bilateral renal stones. Diverticulosis of colon without signs of diverticulitis. 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 There is encroachment of neural foramina at L4-L5 and L5-S1 levels.     06/25/2022 Imaging   CT soft tissue neck with contrast showed1No acute process in the neck to explain the patient's difficulty swallowing.  CT chest contrast showed 1. Patchy areas of airspace opacity in the right upper lobe and left lower lobe and lingula slightly progressed since the prior radiograph and may represent worsening or recurrent pneumonia. Clinical correlation and follow-up to resolution recommended. 2. Aortic Atherosclerosis      History of Present Illness: The patient's records from the referring physician were obtained and reviewed and the patient interviewed to confirm this HPI.  DONNE ROBILLARD presents to review neuropathic symptoms.  She describes pain, tingling, cold sensations in both hands.  Also has symptoms (milder) in feet and along her right arm.  She has baseline pain in arm and weakness in hands with gait difficulty from spinal stenosis.  She had fusion surgery done in 2022 prior to cancer diagnosis.  She is ambulating with a  walker currently.  Doses gabapentin '200mg'$  twice per day with some relief.  Planning move to [place] soon to be nearer to family.  Medications: Current Outpatient Medications on File Prior to Visit  Medication Sig Dispense Refill   acetaminophen (TYLENOL) 500 MG tablet Take 500 mg by mouth every 6 (six) hours as needed.     ALPRAZolam  (XANAX) 0.5 MG tablet TAKE 1 TABLET BY MOUTH TWICE A DAY AS NEEDED FOR ANXIETY 10.11.23 60 tablet 0   aspirin EC 81 MG tablet Take 1 tablet (81 mg total) by mouth daily. Swallow whole. 30 tablet 12   atorvastatin (LIPITOR) 10 MG tablet TAKE 1 TABLET BY MOUTH EVERY DAY 90 tablet 0   busPIRone (BUSPAR) 5 MG tablet TAKE 1 TABLET BY MOUTH THREE TIMES A DAY 270 tablet 0   Cyanocobalamin (VITAMIN B 12 PO) Take 500 mcg by mouth daily.     diclofenac Sodium (VOLTAREN) 1 % GEL Apply 4 g topically 4 (four) times daily. 4 g 0   docusate sodium (COLACE) 100 MG capsule Take 1 capsule (100 mg total) by mouth 2 (two) times daily. 60 capsule 0   FLUoxetine (PROZAC) 20 MG capsule TAKE 3 CAPSULES (60 MG TOTAL) BY MOUTH EVERY MORNING. 270 capsule 0   lidocaine-prilocaine (EMLA) cream Apply small amount of cream to port site 1-2 hour prior to chemo treatment. 30 g 6   loperamide (IMODIUM) 2 MG capsule TAKE 1 CAP BY MOUTH SEE ADMIN INSTRUCTIONS. INITIAL: 4 MG, FOLLOWED BY 2 MG AFTER EACH LOOSE STOOL MAXIMUM: 16 MG/DAY 60 capsule 0   loratadine (CLARITIN) 10 MG tablet Take 1 tablet (10 mg total) by mouth See admin instructions. Take 1 tablet daily for 4 days after each chemotherapy treatments. 90 tablet 0   meclizine (ANTIVERT) 12.5 MG tablet Take 1 tablet (12.5 mg total) by mouth 3 (three) times daily as needed for dizziness. 30 tablet 0   methocarbamol (ROBAXIN) 500 MG tablet TAKE 1 TABLET TWICE DAILY AS NEEDED FOR MUSCLE SPASM(S) 180 tablet 0   ondansetron (ZOFRAN) 8 MG tablet Take 1 tablet (8 mg total) by mouth 2 (two) times daily as needed (Nausea or vomiting). 30 tablet 1   polyethylene glycol powder (GLYCOLAX/MIRALAX) 17 GM/SCOOP powder Take 17 g by mouth daily. 3350 g 1   potassium chloride SA (KLOR-CON M) 20 MEQ tablet Take 2 tablets (40 mEq total) by mouth daily. 90 tablet 0   sucralfate (CARAFATE) 1 GM/10ML suspension Take 10 mLs (1 g total) by mouth 4 (four) times daily -  with meals and at bedtime. 420 mL 0    triamterene-hydrochlorothiazide (MAXZIDE-25) 37.5-25 MG tablet Take 1 tablet by mouth daily. 90 tablet 0   Current Facility-Administered Medications on File Prior to Visit  Medication Dose Route Frequency Provider Last Rate Last Admin   heparin lock flush 100 UNIT/ML injection            palonosetron (ALOXI) 0.25 MG/5ML injection            prochlorperazine (COMPAZINE) 10 MG tablet             Allergies:  Allergies  Allergen Reactions   Penicillins     "Everything turned black"   Past Medical History:  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)    Moderate mitral insufficiency  Port-A-Cath in place    Seizures [facility])    Spinal stenosis    Ulcer    Urinary incontinence    Past Surgical History:  Past Surgical History:  Procedure Laterality Date   ABDOMINAL HYSTERECTOMY     BACK SURGERY     due to polio   BREAST BIOPSY Bilateral    neg   BREAST BIOPSY Right 2011   neg/stereo   CARPAL TUNNEL RELEASE     CATARACT EXTRACTION Right 2020   COLONOSCOPY WITH PROPOFOL N/A 04/04/2021   Procedure: COLONOSCOPY WITH PROPOFOL;  Surgeon: Virgel Manifold, MD;  Location: [facility] ENDOSCOPY;  Service: Endoscopy;  Laterality: N/A;   ESOPHAGOGASTRODUODENOSCOPY (EGD) WITH PROPOFOL N/A 06/25/2022   Procedure: ESOPHAGOGASTRODUODENOSCOPY (EGD) WITH PROPOFOL;  Surgeon: Lin Landsman, MD;  Location: [facility] ENDOSCOPY;  Service: Gastroenterology;  Laterality: N/A;   EYE SURGERY     IR IMAGING GUIDED PORT INSERTION  10/09/2021   Social History:  Social History   Socioeconomic History   Marital status: Divorced    Spouse name: Not on file   Number of children: Not on file   Years of education: Not on file   Highest education level: Not on file  Occupational History   Not on file  Tobacco Use   Smoking status: Never   Smokeless tobacco: Never  Vaping Use    Vaping Use: Never used  Substance and Sexual Activity   Alcohol use: Never   Drug use: No   Sexual activity: Not Currently  Other Topics Concern   Not on file  Social History Narrative   Not on file   Social Determinants of Health   Financial Resource Strain: Low Risk  (01/12/2022)   Overall Financial Resource Strain (CARDIA)    Difficulty of Paying Living Expenses: Not very hard  Food Insecurity: No Food Insecurity (06/24/2022)   Hunger Vital Sign    Worried About Running Out of Food in the Last Year: Never true    Ran Out of Food in the Last Year: Never true  Transportation Needs: No Transportation Needs (06/24/2022)   PRAPARE - Hydrologist (Medical): No    Lack of Transportation (Non-Medical): No  Physical Activity: Inactive (01/12/2022)   Exercise Vital Sign    Days of Exercise per Week: 0 days    Minutes of Exercise per Session: 0 min  Stress: Stress Concern Present (01/12/2022)   [place]    Feeling of Stress : To some extent  Social Connections: Moderately Integrated (01/12/2022)   Social Connection and Isolation Panel [NHANES]    Frequency of Communication with Friends and Family: Three times a week    Frequency of Social Gatherings with Friends and Family: Three times a week    Attends Religious Services: 1 to 4 times per year    Active Member of Clubs or Organizations: No    Attends Archivist Meetings: 1 to 4 times per year    Marital Status: Widowed  Intimate Partner Violence: Not At Risk (06/24/2022)   Humiliation, Afraid, Rape, and Kick questionnaire    Fear of Current or Ex-Partner: No    Emotionally Abused: No    Physically Abused: No    Sexually Abused: No   Family History:  Family History  Problem Relation Age of Onset   Arthritis Mother    Heart disease Mother    Stroke Mother    Hypertension Mother    COPD  Mother    Sudden death Sister    Other  Father        unknown medical history   Breast cancer Neg Hx     Review of Systems: Constitutional: Doesn't report fevers, chills or abnormal weight loss Eyes: Doesn't report blurriness of vision Ears, nose, mouth, throat, and face: Doesn't report sore throat Respiratory: Doesn't report cough, dyspnea or wheezes Cardiovascular: Doesn't report palpitation, chest discomfort  Gastrointestinal:  Doesn't report nausea, constipation, diarrhea GU: Doesn't report incontinence Skin: Doesn't report skin rashes Neurological: Per HPI Musculoskeletal: Doesn't report joint pain Behavioral/Psych: Doesn't report anxiety  Physical Exam: Vitals:   10/16/22 0956  BP: 137/72  Pulse: 71  Temp: (!) 95.1 F (35.1 C)   KPS: 60. General: Alert, cooperative, pleasant, in no acute distress Head: Normal EENT: No conjunctival injection or scleral icterus.  Lungs: Resp effort normal Cardiac: Regular rate Abdomen: Non-distended abdomen Skin: No rashes cyanosis or petechiae. Extremities: No clubbing or edema  Neurologic Exam: Mental Status: Awake, alert, attentive to examiner. Oriented to self and environment. Language is fluent with intact comprehension.  Cranial Nerves: Visual acuity is grossly normal. Visual fields are full. Extra-ocular movements intact. No ptosis. Face is symmetric Motor: Tone and bulk are normal. Impaired fine coordination and limited range of motion in hands and right arm at shoulder. Reflexes are decreased, no pathologic reflexes present.  Sensory: Grossly Intact  Gait: Deferred   Labs: I have reviewed the data as listed    Component Value Date/Time   NA 138 10/06/2022 0845   K 3.5 10/06/2022 0845   CL 104 10/06/2022 0845   CO2 27 10/06/2022 0845   GLUCOSE 119 (H) 10/06/2022 0845   BUN 13 10/06/2022 0845   CREATININE 0.86 10/06/2022 0845   CREATININE 0.87 08/13/2020 1129   CALCIUM 9.0 10/06/2022 0845   PROT 6.9 10/06/2022 0845   ALBUMIN 3.4 (L) 10/06/2022 0845    AST 26 10/06/2022 0845   ALT 11 10/06/2022 0845   ALKPHOS 61 10/06/2022 0845   BILITOT 0.8 10/06/2022 0845   GFRNONAA >60 10/06/2022 0845   GFRNONAA 65 08/13/2020 1129   GFRAA 76 08/13/2020 1129   Lab Results  Component Value Date   WBC 3.0 (L) 10/06/2022   NEUTROABS 1.1 (L) 10/06/2022   HGB 9.9 (L) 10/06/2022   HCT 31.7 (L) 10/06/2022   MCV 104.6 (H) 10/06/2022   PLT 144 (L) 10/06/2022    Imaging:  CT CHEST ABDOMEN PELVIS W CONTRAST  Result Date: 10/14/2022 CLINICAL DATA:  Follow-up metastatic colon carcinoma. Currently undergoing chemotherapy. Restaging. * Tracking Code: BO * EXAM: CT CHEST, ABDOMEN, AND PELVIS WITH CONTRAST TECHNIQUE: Multidetector CT imaging of the chest, abdomen and pelvis was performed following the standard protocol during bolus administration of intravenous contrast. RADIATION DOSE REDUCTION: This exam was performed according to the departmental dose-optimization program which includes automated exposure control, adjustment of the mA and/or kV according to patient size and/or use of iterative reconstruction technique. CONTRAST:  13m OMNIPAQUE IOHEXOL 300 MG/ML  SOLN COMPARISON:  Chest CT on 06/25/2022, and AP CT on 06/24/2022 FINDINGS: CT CHEST FINDINGS Cardiovascular: No acute findings. Mediastinum/Lymph Nodes: No masses or pathologically enlarged lymph nodes identified. Lungs/Pleura: Airspace disease is again seen in the medial right upper lobe, lingula, and posterior left lower lobe, without significant change. No suspicious pulmonary nodules or masses are identified. No evidence of pleural effusion. Musculoskeletal:  No suspicious bone lesions identified. CT ABDOMEN AND PELVIS FINDINGS Hepatobiliary: Multiple small hypovascular masses are again  seen in the left and right hepatic lobes, largest measuring approximately 2 cm in segment 2. These show no significant change. Gallbladder is unremarkable. No evidence of biliary ductal dilatation. Pancreas:  No mass or  inflammatory changes. Spleen:  Within normal limits in size and appearance. Adrenals/Urinary tract: No suspicious masses or hydronephrosis. Stable mild bilateral renal parenchymal scarring. Stable small left posterior bladder diverticulum. Stomach/Bowel: No evidence of obstruction, inflammatory process, or abnormal fluid collections. Vascular/Lymphatic: No pathologically enlarged lymph nodes identified. No acute vascular findings. Reproductive: No uterus is seen. A low-attenuation mass is again seen in the pelvic cul-de-sac which involves the vaginal cuff. This measures 5.1 x 3.6 cm on image 82/2, and shows no significant change compared to prior exam. No evidence of ascites. Other:  None. Musculoskeletal: No suspicious bone lesions identified. 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 IMPRESSION: Stable small hypovascular liver metastases. Stable mass in pelvic cul-de-sac involving the vaginal cuff, consistent with metastatic disease. No new or progressive metastatic disease within the chest, abdomen, or pelvis. Stable bilateral bilateral airspace disease, suspicious for pneumonia or inflammatory etiology. Electronically Signed   By: Marlaine Hind M.D.   On: 10/14/2022 08:55     Assessment/Plan Chemotherapy-induced neuropathy ([place])  [name] presents with clinical syndrome consistent with symmetric, length dependent, small and large fiber peripheral neuropathy.    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  We reviewed pathophysiology of chemotherapy induced neuropathy, available treatments, and goals of care.  She is agreeable with trial of higher dose of gabapentin, '300mg'$  TID.  We spent twenty additional minutes teaching regarding the natural history, biology, and historical experience in the treatment of neurologic complications of cancer.   We appreciate the opportunity to participate in  the care of GAO MITNICK.   All questions were answered. The patient knows to call the clinic with any problems, questions or concerns. No barriers to learning were detected.  The total time spent in the encounter was 40 minutes and more than 50% was on counseling and review of test results   Ventura Sellers, MD Medical Director of Neuro-Oncology [facility] [place] [facility] 10/16/22 11:18 AM

## 2022-10-20 ENCOUNTER — Encounter: Payer: Self-pay | Admitting: Oncology

## 2022-10-20 ENCOUNTER — Other Ambulatory Visit: Payer: Self-pay

## 2022-10-20 ENCOUNTER — Inpatient Hospital Stay (HOSPITAL_BASED_OUTPATIENT_CLINIC_OR_DEPARTMENT_OTHER): Payer: Medicare HMO | Admitting: Oncology

## 2022-10-20 ENCOUNTER — Inpatient Hospital Stay: Payer: Medicare HMO

## 2022-10-20 VITALS — BP 111/73 | HR 75 | Temp 96.9°F | Resp 18 | Wt 148.0 lb

## 2022-10-20 DIAGNOSIS — G620 Drug-induced polyneuropathy: Secondary | ICD-10-CM | POA: Diagnosis not present

## 2022-10-20 DIAGNOSIS — C189 Malignant neoplasm of colon, unspecified: Secondary | ICD-10-CM

## 2022-10-20 DIAGNOSIS — C180 Malignant neoplasm of cecum: Secondary | ICD-10-CM | POA: Diagnosis not present

## 2022-10-20 DIAGNOSIS — D6481 Anemia due to antineoplastic chemotherapy: Secondary | ICD-10-CM | POA: Diagnosis not present

## 2022-10-20 DIAGNOSIS — Z5111 Encounter for antineoplastic chemotherapy: Secondary | ICD-10-CM

## 2022-10-20 DIAGNOSIS — Z5112 Encounter for antineoplastic immunotherapy: Secondary | ICD-10-CM | POA: Diagnosis not present

## 2022-10-20 DIAGNOSIS — D701 Agranulocytosis secondary to cancer chemotherapy: Secondary | ICD-10-CM

## 2022-10-20 DIAGNOSIS — D709 Neutropenia, unspecified: Secondary | ICD-10-CM | POA: Insufficient documentation

## 2022-10-20 DIAGNOSIS — C787 Secondary malignant neoplasm of liver and intrahepatic bile duct: Secondary | ICD-10-CM | POA: Diagnosis not present

## 2022-10-20 DIAGNOSIS — Z79899 Other long term (current) drug therapy: Secondary | ICD-10-CM | POA: Diagnosis not present

## 2022-10-20 DIAGNOSIS — T451X5A Adverse effect of antineoplastic and immunosuppressive drugs, initial encounter: Secondary | ICD-10-CM | POA: Diagnosis not present

## 2022-10-20 DIAGNOSIS — D702 Other drug-induced agranulocytosis: Secondary | ICD-10-CM

## 2022-10-20 LAB — CBC WITH DIFFERENTIAL/PLATELET
Abs Immature Granulocytes: 0 10*3/uL (ref 0.00–0.07)
Basophils Absolute: 0 10*3/uL (ref 0.0–0.1)
Basophils Relative: 0 %
Eosinophils Absolute: 0.1 10*3/uL (ref 0.0–0.5)
Eosinophils Relative: 5 %
HCT: 31.1 % — ABNORMAL LOW (ref 36.0–46.0)
Hemoglobin: 9.7 g/dL — ABNORMAL LOW (ref 12.0–15.0)
Immature Granulocytes: 0 %
Lymphocytes Relative: 49 %
Lymphs Abs: 1.2 10*3/uL (ref 0.7–4.0)
MCH: 32.9 pg (ref 26.0–34.0)
MCHC: 31.2 g/dL (ref 30.0–36.0)
MCV: 105.4 fL — ABNORMAL HIGH (ref 80.0–100.0)
Monocytes Absolute: 0.4 10*3/uL (ref 0.1–1.0)
Monocytes Relative: 15 %
Neutro Abs: 0.8 10*3/uL — ABNORMAL LOW (ref 1.7–7.7)
Neutrophils Relative %: 31 %
Platelets: 123 10*3/uL — ABNORMAL LOW (ref 150–400)
RBC: 2.95 MIL/uL — ABNORMAL LOW (ref 3.87–5.11)
RDW: 16 % — ABNORMAL HIGH (ref 11.5–15.5)
WBC: 2.5 10*3/uL — ABNORMAL LOW (ref 4.0–10.5)
nRBC: 0 % (ref 0.0–0.2)

## 2022-10-20 LAB — COMPREHENSIVE METABOLIC PANEL
ALT: 11 U/L (ref 0–44)
AST: 18 U/L (ref 15–41)
Albumin: 3.2 g/dL — ABNORMAL LOW (ref 3.5–5.0)
Alkaline Phosphatase: 55 U/L (ref 38–126)
Anion gap: 8 (ref 5–15)
BUN: 14 mg/dL (ref 8–23)
CO2: 30 mmol/L (ref 22–32)
Calcium: 8.8 mg/dL — ABNORMAL LOW (ref 8.9–10.3)
Chloride: 101 mmol/L (ref 98–111)
Creatinine, Ser: 0.8 mg/dL (ref 0.44–1.00)
GFR, Estimated: >60 mL/min (ref >60)
Glucose, Bld: 99 mg/dL (ref 70–99)
Potassium: 3.5 mmol/L (ref 3.5–5.1)
Sodium: 139 mmol/L (ref 135–145)
Total Bilirubin: 0.4 mg/dL (ref 0.3–1.2)
Total Protein: 6.8 g/dL (ref 6.5–8.1)

## 2022-10-20 MED ORDER — HEPARIN SOD (PORK) LOCK FLUSH 100 UNIT/ML IV SOLN
500.0000 [IU] | Freq: Once | INTRAVENOUS | Status: AC
  Administered 2022-10-20: 500 [IU] via INTRAVENOUS
  Filled 2022-10-20: qty 5

## 2022-10-20 NOTE — Assessment & Plan Note (Signed)
Chemotherapy plan as listed above 

## 2022-10-20 NOTE — Assessment & Plan Note (Addendum)
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  No sufficient tissue for NGS Liquid biopsy showed KRAS12V, APC, TP53 mutation, TMB 4.  S/p 6 cycles of 5-FU /Bevacizumab--> CT showed mixed response--> FOLFOX/bevacizumab.-->CT imaging shows stable disease --> dc oxaliplatin, 5-FU/Bev maintenance 09/07/22 Interval CT showed stable disease.  Continue current regimen. Labs reviewed and discussed with patient Hold 5-FU/bevacizumab.  Due to neutropenia.  Repeat CBC in 1 week and if ANC improves, she will proceed with chemotherapy.  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  Stable disease.

## 2022-10-20 NOTE — Progress Notes (Addendum)
Hematology/Oncology Progress note [phone] [phone]         Patient Care Team: Jearld Fenton, NP as PCP - General (Internal Medicine) Clent Jacks, RN as Oncology Nurse Navigator [name], MD as Consulting Physician (Oncology)  ASSESSMENT & PLAN:   Cancer Staging  Metastatic colon cancer to liver [facility]) Staging form: Colon and Rectum, AJCC 8th Edition - Clinical stage from 09/04/2021: Stage Unknown (cTX, cNX, cM1) - Signed by [name], MD on 05/13/2022   Metastatic colon cancer to liver [facility]) #Metastatic colon cancer to liver, 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  No sufficient tissue for NGS Liquid biopsy showed KRAS12V, APC, TP53 mutation, TMB 4.  S/p 6 cycles of 5-FU /Bevacizumab--> CT showed mixed response--> FOLFOX/bevacizumab.-->CT imaging shows stable disease --> dc oxaliplatin, 5-FU/Bev maintenance 09/07/22 Interval CT showed stable disease.  Continue current regimen. Labs reviewed and discussed with patient Hold 5-FU/bevacizumab.  Due to neutropenia.  Repeat CBC in 1 week and if ANC improves, she will proceed with chemotherapy.  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  Stable disease.  Anemia due to antineoplastic chemotherapy Hemoglobin is stable.  Continue monitor.    Encounter for antineoplastic chemotherapy Chemotherapy plan as listed above  Chemotherapy induced neutropenia ([place]) Recommends Zarxio daily x 3 Will add Udenyca to next cycle of chemo.   Chemotherapy-induced neuropathy (HCC) Continue gabapentin 300 mg 3 times daily.  Orders Placed This Encounter  Procedures   Protein, urine, random    Standing Status:   Future    Standing Expiration Date:   11/26/2022   CBC with Differential    Standing Status:   Future    Standing Expiration Date:   10/27/2023   Comprehensive metabolic panel    Standing Status:   Future    Standing Expiration Date:   10/27/2023   Follow-up 1 week, lab and  chemotherapy, D3 pump discontinuation and Udenyca. After that patient is moving to [place].  No additional appointment is scheduled.  All questions were answered. The patient knows to call the clinic with any problems, questions or concerns.  [name], MD, PhD [facility] Hematology Oncology 10/20/2022   CHIEF COMPLAINTS/REASON FOR VISIT:  metastatic colon cancer  HISTORY OF PRESENTING ILLNESS:   Chelsea [name] is a  77 y.o.  female presents for management of metastatic colon cancer. Oncology History  Metastatic colon cancer to liver ([place])  08/21/2021 Imaging   CT abdomen pelvis with contrast  showed soft tissue mass at the base of cecum measuring up to 5 cm, slightly increased in size.  Interval development of multiple low-density lesions throughout the liver highly suggestive for metastatic disease.  Complex mass in the region of the vaginal cuff measuring up to 5.2 cm, increased in size.  Right ovary also appears more prominent on the current examination.  Moderate large volume of stool throughout the colon.  Mild urinary bladder wall thickening.  Colonic diverticulosis without evidence of active diverticulitis.  Aortic atherosclerosis   09/04/2021 Procedure   patient is status post left lobe liver lesion biopsy and pathology showed metastatic adenocarcinoma, compatible with colorectal primary.   09/04/2021 Cancer Staging   Staging form: Colon and Rectum, AJCC 8th Edition - Clinical stage from 09/04/2021: Stage Unknown (cTX, cNX, cM1) - Signed by [name], MD on 05/13/2022 Stage prefix: Initial diagnosis   09/16/2021 Initial Diagnosis   Metastatic colon cancer to liver [facility]) Foundation one liquid biopsy testing showed KRASG12V, APC TP53 TMB 4  Patient  initially went to emergency room on 03/24/2021 for abdominal pain. 03/24/2021, CT scan of the abdomen showed marked bladder wall thickening with surrounding soft tissue stranding is noted.  Concerning for cystitis.  There is a lobulated  mass between the posterior wall of the bladder and the rectum which appears to arise from the vaginal cuff.  This is indeterminate and difficult to characterize reflecting lack of IV contrast material.  Nonobstructing left renal calculus, left sacroiliitis, lumbar spondylosis aortic atherosclerosis. 03/24/2021 CT pelvic showed abdominal [place] soft tissue of right cecum, concerning for colon carcinoma.  Colonoscopy recommended.  Low-attenuation mass in the region of vaginal cuff is again noted.  Diffuse urinary bladder wall thickening and mild bilateral hydroureter possibly cystitis.   [place] 03/24/2021 Lobulated solid 3.8 cm mass confirmed by ultrasound. This is most compatible with a neoplasm but remains indeterminate as to the origin as the epicenter seems to be outside of both the vaginal cuff and the adjacent colon, while the lesion appears inseparable from both. Extensive echogenic debris within the distended urinary bladder.   03/26/2021, patient was seen by Dr. Theora Gianotti for evaluation of colonic/vaginal cuff pelvic mass.  Patient also was referred to gastroenterology for colonoscopy.   03/26/2021 CEA 3.4.  CA125 9.7 04/04/2021, status post colonoscopy which showed a frond like /villous nonobstructing large mass was found in the cecum, this was biopsied.  Terminal ileum was briefly intubated and appeared normal.  Diverticulosis in the sigmoid colon.  The rectum, sigmoid colon, descending colon, transverse colon and ascending colon were normal.  Nonbleeding internal hemorrhoids. 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 Given that the biopsy may not be representative of the entire underlying lesion.  Patient was recommended to establish with surgeon for evaluation. Patient no showed for her surgery appointment and as well as her follow-up appointment with gastroenterology.  Per daughter, patient was having cervical spine surgery and she prioritized that over the colon surgery.    10/20/2021 - 01/07/2022 Chemotherapy    COLORECTAL 5-FU  + Bevacizumab q14d      10/20/2021 -  Chemotherapy   Patient is on Treatment Plan : COLORECTAL 5FU + Leucovorin (Modified [place]) + Bevacizumab q14d     12/22/2021 Imaging   CT chest abdomen pelvis Decreased cecum mass, however increased size and size of metastatic lesions throughout the liver.  Interval enlargement of vaginal cuff mass. No mets in chest.    01/21/2022 -  Chemotherapy   COLORECTAL FOLFOX  + Bevacizumab q14d    patient was seen by gynecology oncology Dr. Fransisca Connors.  Biopsy of the vaginal mass was not recommended.Patient's case was also discussed on multidisciplinary tumor board.  IR potentially can try a biopsy if patient is willing to.I had a discussion with patient's daughter over the phone prior to this visit.  We discussed about option of adding oxaliplatin to 5-FU/bevacizumab and continue treatment of colon cancer, repeat CT scan short-term and if progression, repeat biopsy versus proceeding with vaginal mass biopsy.  Daughter prefers proceeding with chemotherapy and defer biopsy.   01/08/2022 patient has had a second opinion at [facility] and was seen by Dr. Reynaldo Minium .  Dr. Reynaldo Minium agrees with the current treatment plan with FOLFOX/bevacizumab.  He has ordered guardant 360 to see if she may be eligible for clinical trial in the future.  -   04/13/2022 Imaging    CT chest abdomen pelvis showed a cecal mass with stable hepatic metastasis.  Mixed cystic and solid mass in the region of the vaginal  cuff, new area of patchy groundglass and consolidation in the right upper lobe, lingula and left lower lobe.  Likely due to pneumonia.  Hepatic steatosis.  Bilateral renal stones.  Aortic atherosclerosis   06/24/2022 Imaging   CT abdomen pelvis with contrast showed There are low-density lesions in liver consistent with metastatic disease with no significant change. Wall thickening in the cecum appears less prominent. There is 5.5 x  4.2 cm mixed density lesion in vaginal cuff suggesting possible metastatic disease.   There is no evidence of intestinal obstruction or pneumoperitoneum.There is no hydronephrosis.   Small patchy infiltrates are seen in the lower lung fields suggesting atelectasis/pneumonia.   Small bilateral renal stones. Diverticulosis of colon without signs of diverticulitis. 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 There is encroachment of neural foramina at L4-L5 and L5-S1 levels.     06/25/2022 Imaging   CT soft tissue neck with contrast showed1No acute process in the neck to explain the patient's difficulty swallowing.  CT chest contrast showed 1. Patchy areas of airspace opacity in the right upper lobe and left lower lobe and lingula slightly progressed since the prior radiograph and may represent worsening or recurrent pneumonia. Clinical correlation and follow-up to resolution recommended. 2. Aortic Atherosclerosis    10/14/2022 Imaging   CT chest abdomen pelvis with contrast showed Stable small hypovascular liver metastases.  Stable mass in pelvic cul-de-sac involving the vaginal cuff,consistent with metastatic disease.  No new or progressive metastatic disease within the chest, abdomen,or pelvis. Stable bilateral bilateral airspace disease, suspicious for pneumonia or inflammatory etiology.    06/24/2022 - 06/26/2022 patient was hospitalized due to difficulty swallowing, 1 day of abdominal pain.  Patient had extensive work-up including EGD which showed erosive gastritis.  No significant esophageal or gastric abnormality.  CT scan showed possible aspiration induced pneumonitis.  Antibiotics was held.  Today patient denies any fever, chills,.  Occasionally she has cough.  There is plan of possible relocation to [place] area next year.   INTERVAL HISTORY Chelsea [name] is a 77 y.o. female who has above history reviewed by me today presents for follow up visit for  management of metastatic colon cancer Patient was accompanied by daughter + fatigue stable.  + appetite is fair.  No nausea vomiting diarrhea   Right side pre-existing neuropathy . On gabapentin.. Left hand finger tip neuropathy intermittent. Establish with neurologist.  Review of Systems  Constitutional:  Positive for fatigue. Negative for appetite change, chills, fever and unexpected weight change.  HENT:   Negative for hearing loss and voice change.   Eyes:  Negative for eye problems.  Respiratory:  Negative for chest tightness and cough.   Cardiovascular:  Negative for chest pain.  Gastrointestinal:  Negative for abdominal distention, abdominal pain, blood in stool and diarrhea.  Endocrine: Negative for hot flashes.  Genitourinary:  Negative for difficulty urinating and frequency.   Musculoskeletal:  Positive for back pain. Negative for arthralgias.  Skin:  Negative for itching and rash.  Neurological:  Positive for numbness (chornic finger tips). Negative for extremity weakness.  Hematological:  Negative for adenopathy.  Psychiatric/Behavioral:  Negative for confusion.     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)    Moderate mitral insufficiency    Port-A-Cath in place  Seizures ([place])    Spinal stenosis    Ulcer    Urinary incontinence     SURGICAL HISTORY: Past Surgical History:  Procedure Laterality Date   ABDOMINAL HYSTERECTOMY     BACK SURGERY     due to polio   BREAST BIOPSY Bilateral    neg   BREAST BIOPSY Right 2011   neg/stereo   CARPAL TUNNEL RELEASE     CATARACT EXTRACTION Right 2020   COLONOSCOPY WITH PROPOFOL N/A 04/04/2021   Procedure: COLONOSCOPY WITH PROPOFOL;  Surgeon: Virgel Manifold, MD;  Location: [facility] ENDOSCOPY;  Service: Endoscopy;  Laterality: N/A;   ESOPHAGOGASTRODUODENOSCOPY (EGD) WITH  PROPOFOL N/A 06/25/2022   Procedure: ESOPHAGOGASTRODUODENOSCOPY (EGD) WITH PROPOFOL;  Surgeon: Lin Landsman, MD;  Location: [facility] ENDOSCOPY;  Service: Gastroenterology;  Laterality: N/A;   EYE SURGERY     IR IMAGING GUIDED PORT INSERTION  10/09/2021    SOCIAL HISTORY: Social History   Socioeconomic History   Marital status: Divorced    Spouse name: Not on file   Number of children: Not on file   Years of education: Not on file   Highest education level: Not on file  Occupational History   Not on file  Tobacco Use   Smoking status: Never   Smokeless tobacco: Never  Vaping Use   Vaping Use: Never used  Substance and Sexual Activity   Alcohol use: Never   Drug use: No   Sexual activity: Not Currently  Other Topics Concern   Not on file  Social History Narrative   Not on file   Social Determinants of Health   Financial Resource Strain: Low Risk  (01/12/2022)   Overall Financial Resource Strain (CARDIA)    Difficulty of Paying Living Expenses: Not very hard  Food Insecurity: No Food Insecurity (06/24/2022)   Hunger Vital Sign    Worried About Running Out of Food in the Last Year: Never true    Ran Out of Food in the Last Year: Never true  Transportation Needs: No Transportation Needs (06/24/2022)   PRAPARE - Hydrologist (Medical): No    Lack of Transportation (Non-Medical): No  Physical Activity: Inactive (01/12/2022)   Exercise Vital Sign    Days of Exercise per Week: 0 days    Minutes of Exercise per Session: 0 min  Stress: Stress Concern Present (01/12/2022)   [place]    Feeling of Stress : To some extent  Social Connections: Moderately Integrated (01/12/2022)   Social Connection and Isolation Panel [NHANES]    Frequency of Communication with Friends and Family: Three times a week    Frequency of Social Gatherings with Friends and Family: Three times a week    Attends  Religious Services: 1 to 4 times per year    Active Member of Clubs or Organizations: No    Attends Archivist Meetings: 1 to 4 times per year    Marital Status: Widowed  Intimate Partner Violence: Not At Risk (06/24/2022)   Humiliation, Afraid, Rape, and Kick questionnaire    Fear of Current or Ex-Partner: No    Emotionally Abused: No    Physically Abused: No    Sexually Abused: No    FAMILY HISTORY: Family History  Problem Relation Age of Onset   Arthritis Mother    Heart disease Mother    Stroke Mother    Hypertension Mother    COPD Mother    Sudden death  Sister    Other Father        unknown medical history   Breast cancer Neg Hx     ALLERGIES:  is allergic to penicillins.  MEDICATIONS:  Current Outpatient Medications  Medication Sig Dispense Refill   acetaminophen (TYLENOL) 500 MG tablet Take 500 mg by mouth every 6 (six) hours as needed.     ALPRAZolam (XANAX) 0.5 MG tablet TAKE 1 TABLET BY MOUTH TWICE A DAY AS NEEDED FOR ANXIETY 10.11.23 60 tablet 0   aspirin EC 81 MG tablet Take 1 tablet (81 mg total) by mouth daily. Swallow whole. 30 tablet 12   atorvastatin (LIPITOR) 10 MG tablet TAKE 1 TABLET BY MOUTH EVERY DAY 90 tablet 0   busPIRone (BUSPAR) 5 MG tablet TAKE 1 TABLET BY MOUTH THREE TIMES A DAY 270 tablet 0   Cyanocobalamin (VITAMIN B 12 PO) Take 500 mcg by mouth daily.     diclofenac Sodium (VOLTAREN) 1 % GEL Apply 4 g topically 4 (four) times daily. 4 g 0   docusate sodium (COLACE) 100 MG capsule Take 1 capsule (100 mg total) by mouth 2 (two) times daily. 60 capsule 0   FLUoxetine (PROZAC) 20 MG capsule TAKE 3 CAPSULES (60 MG TOTAL) BY MOUTH EVERY MORNING. 270 capsule 0   gabapentin (NEURONTIN) 300 MG capsule Take 1 capsule (300 mg total) by mouth 3 (three) times daily. 90 capsule 3   lidocaine-prilocaine (EMLA) cream Apply small amount of cream to port site 1-2 hour prior to chemo treatment. 30 g 6   loperamide (IMODIUM) 2 MG capsule TAKE 1 CAP BY  MOUTH SEE ADMIN INSTRUCTIONS. INITIAL: 4 MG, FOLLOWED BY 2 MG AFTER EACH LOOSE STOOL MAXIMUM: 16 MG/DAY 60 capsule 0   loratadine (CLARITIN) 10 MG tablet Take 1 tablet (10 mg total) by mouth See admin instructions. Take 1 tablet daily for 4 days after each chemotherapy treatments. 90 tablet 0   meclizine (ANTIVERT) 12.5 MG tablet Take 1 tablet (12.5 mg total) by mouth 3 (three) times daily as needed for dizziness. 30 tablet 0   methocarbamol (ROBAXIN) 500 MG tablet TAKE 1 TABLET TWICE DAILY AS NEEDED FOR MUSCLE SPASM(S) 180 tablet 0   ondansetron (ZOFRAN) 8 MG tablet Take 1 tablet (8 mg total) by mouth 2 (two) times daily as needed (Nausea or vomiting). 30 tablet 1   polyethylene glycol powder (GLYCOLAX/MIRALAX) 17 GM/SCOOP powder Take 17 g by mouth daily. 3350 g 1   potassium chloride SA (KLOR-CON M) 20 MEQ tablet Take 2 tablets (40 mEq total) by mouth daily. 90 tablet 0   sucralfate (CARAFATE) 1 GM/10ML suspension Take 10 mLs (1 g total) by mouth 4 (four) times daily -  with meals and at bedtime. 420 mL 0   triamterene-hydrochlorothiazide (MAXZIDE-25) 37.5-25 MG tablet Take 1 tablet by mouth daily. 90 tablet 0   No current facility-administered medications for this visit.   Facility-Administered Medications Ordered in Other Visits  Medication Dose Route Frequency Provider Last Rate Last Admin   heparin lock flush 100 UNIT/ML injection            palonosetron (ALOXI) 0.25 MG/5ML injection            prochlorperazine (COMPAZINE) 10 MG tablet              PHYSICAL EXAMINATION: ECOG PERFORMANCE STATUS: 2 - Symptomatic, <50% confined to bed Vitals:   10/20/22 1321  BP: 111/73  Pulse: 75  Resp: 18  Temp: (!) 96.9 F (36.1 C)  Filed Weights   10/20/22 1321  Weight: 148 lb (67.1 kg)      Physical Exam Constitutional:      General: She is not in acute distress.    Comments: Patient sits in wheelchair  HENT:     Head: Normocephalic and atraumatic.  Eyes:     General: No scleral  icterus. Cardiovascular:     Rate and Rhythm: Normal rate and regular rhythm.     Heart sounds: Normal heart sounds.  Pulmonary:     Effort: Pulmonary effort is normal. No respiratory distress.     Breath sounds: No wheezing.  Abdominal:     General: Bowel sounds are normal. There is no distension.     Palpations: Abdomen is soft.  Musculoskeletal:        General: No deformity. Normal range of motion.     Cervical back: Normal range of motion and neck supple.  Skin:    General: Skin is warm and dry.     Findings: No erythema or rash.  Neurological:     Mental Status: She is alert and oriented to person, place, and time. Mental status is at baseline.     Cranial Nerves: No cranial nerve deficit.     Coordination: Coordination normal.  Psychiatric:        Mood and Affect: Mood normal.     LABORATORY DATA:  I have reviewed the data as listed     Latest Ref Rng & Units 10/20/2022   12:57 PM 10/06/2022    8:45 AM 09/21/2022    8:57 AM  CBC  WBC 4.0 - 10.5 K/uL 2.5  3.0  4.1   Hemoglobin 12.0 - 15.0 g/dL 9.7  9.9  9.8   Hematocrit 36.0 - 46.0 % 31.1  31.7  30.8   Platelets 150 - 400 K/uL 123  144  129       Latest Ref Rng & Units 10/20/2022   12:57 PM 10/06/2022    8:45 AM 09/21/2022    8:57 AM  CMP  Glucose 70 - 99 mg/dL 99  119  127   BUN 8 - 23 mg/dL '14  13  16   '$ Creatinine 0.44 - 1.00 mg/dL 0.80  0.86  0.76   Sodium 135 - 145 mmol/L 139  138  140   Potassium 3.5 - 5.1 mmol/L 3.5  3.5  3.1   Chloride 98 - 111 mmol/L 101  104  104   CO2 22 - 32 mmol/L '30  27  26   '$ Calcium 8.9 - 10.3 mg/dL 8.8  9.0  8.9   Total Protein 6.5 - 8.1 g/dL 6.8  6.9  7.1   Total Bilirubin 0.3 - 1.2 mg/dL 0.4  0.8  0.8   Alkaline Phos 38 - 126 U/L 55  61  66   AST 15 - 41 U/L '18  26  22   '$ ALT 0 - 44 U/L '11  11  9      '$ RADIOGRAPHIC STUDIES: I have personally reviewed the radiological images as listed and agreed with the findings in the report. CT CHEST ABDOMEN PELVIS W CONTRAST  Result  Date: 10/14/2022 CLINICAL DATA:  Follow-up metastatic colon carcinoma. Currently undergoing chemotherapy. Restaging. * Tracking Code: BO * EXAM: CT CHEST, ABDOMEN, AND PELVIS WITH CONTRAST TECHNIQUE: Multidetector CT imaging of the chest, abdomen and pelvis was performed following the standard protocol during bolus administration of intravenous contrast. RADIATION DOSE REDUCTION: This exam was performed according to the departmental  dose-optimization program which includes automated exposure control, adjustment of the mA and/or kV according to patient size and/or use of iterative reconstruction technique. CONTRAST:  199m OMNIPAQUE IOHEXOL 300 MG/ML  SOLN COMPARISON:  Chest CT on 06/25/2022, and AP CT on 06/24/2022 FINDINGS: CT CHEST FINDINGS Cardiovascular: No acute findings. Mediastinum/Lymph Nodes: No masses or pathologically enlarged lymph nodes identified. Lungs/Pleura: Airspace disease is again seen in the medial right upper lobe, lingula, and posterior left lower lobe, without significant change. No suspicious pulmonary nodules or masses are identified. No evidence of pleural effusion. Musculoskeletal:  No suspicious bone lesions identified. CT ABDOMEN AND PELVIS FINDINGS Hepatobiliary: Multiple small hypovascular masses are again seen in the left and right hepatic lobes, largest measuring approximately 2 cm in segment 2. These show no significant change. Gallbladder is unremarkable. No evidence of biliary ductal dilatation. Pancreas:  No mass or inflammatory changes. Spleen:  Within normal limits in size and appearance. Adrenals/Urinary tract: No suspicious masses or hydronephrosis. Stable mild bilateral renal parenchymal scarring. Stable small left posterior bladder diverticulum. Stomach/Bowel: No evidence of obstruction, inflammatory process, or abnormal fluid collections. Vascular/Lymphatic: No pathologically enlarged lymph nodes identified. No acute vascular findings. Reproductive: No uterus is seen. A  low-attenuation mass is again seen in the pelvic cul-de-sac which involves the vaginal cuff. This measures 5.1 x 3.6 cm on image 82/2, and shows no significant change compared to prior exam. No evidence of ascites. Other:  None. Musculoskeletal: No suspicious bone lesions identified. 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 IMPRESSION: Stable small hypovascular liver metastases. Stable mass in pelvic cul-de-sac involving the vaginal cuff, consistent with metastatic disease. No new or progressive metastatic disease within the chest, abdomen, or pelvis. Stable bilateral bilateral airspace disease, suspicious for pneumonia or inflammatory etiology. Electronically Signed   By: JMarlaine HindM.D.   On: 10/14/2022 08:55   MM DIAG BREAST TOMO UNI LEFT  Result Date: 07/31/2022 CLINICAL DATA:  The lung 77year old female recalled from screening mammogram dated 07/09/2022 for a possible left breast mass. EXAM: DIGITAL DIAGNOSTIC UNILATERAL LEFT MAMMOGRAM WITH TOMOSYNTHESIS; ULTRASOUND LEFT BREAST LIMITED TECHNIQUE: Left digital diagnostic mammography and breast tomosynthesis was performed.; Targeted ultrasound examination of the left breast was performed. COMPARISON:  Previous exam(s). ACR Breast Density Category b: There are scattered areas of fibroglandular density. FINDINGS: There is a persistent oval, circumscribed equal density mass in the upper outer left breast at mid depth. Note is made of associated coarse calcifications. Targeted ultrasound is performed, showing an oval, circumscribed hypoechoic mass with internal calcifications at the 2 o'clock position 10 cm from the nipple. It measures 8 x 4 x 7 mm without significant internal vascularity. This correlates well with the mammographic finding. 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 Recommend short-term follow-up. RECOMMENDATION: Left breast ultrasound in 6 months. I have  discussed the findings and recommendations with the patient. If applicable, a reminder letter will be sent to the patient regarding the next appointment. BI-RADS CATEGORY  3: Probably benign. Electronically Signed   By: SKristopher OppenheimM.D.   On: 07/31/2022 12:06  [place]AST LTD UNI LEFT INC AXILLA  Result Date: 07/31/2022 CLINICAL DATA:  The lung 77year old female recalled from screening mammogram dated 07/09/2022 for a possible left breast mass. EXAM: DIGITAL DIAGNOSTIC UNILATERAL LEFT MAMMOGRAM WITH TOMOSYNTHESIS; ULTRASOUND LEFT BREAST LIMITED TECHNIQUE: Left digital diagnostic mammography and breast tomosynthesis was performed.; Targeted ultrasound examination of the left breast was performed. COMPARISON:  Previous exam(s). ACR  Breast Density Category b: There are scattered areas of fibroglandular density. FINDINGS: There is a persistent oval, circumscribed equal density mass in the upper outer left breast at mid depth. Note is made of associated coarse calcifications. Targeted ultrasound is performed, showing an oval, circumscribed hypoechoic mass with internal calcifications at the 2 o'clock position 10 cm from the nipple. It measures 8 x 4 x 7 mm without significant internal vascularity. This correlates well with the mammographic finding. 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 Recommend short-term follow-up. RECOMMENDATION: Left breast ultrasound in 6 months. I have discussed the findings and recommendations with the patient. If applicable, a reminder letter will be sent to the patient regarding the next appointment. BI-RADS CATEGORY  3: Probably benign. Electronically Signed   By: Kristopher Oppenheim M.D.   On: 07/31/2022 12:06

## 2022-10-20 NOTE — Assessment & Plan Note (Signed)
Hemoglobin is stable.  Continue monitor.   

## 2022-10-20 NOTE — Assessment & Plan Note (Signed)
Continue gabapentin 300 mg 3 times daily. 

## 2022-10-20 NOTE — Assessment & Plan Note (Signed)
Recommends Zarxio daily x 3 Will add Udenyca to next cycle of chemo.

## 2022-10-21 ENCOUNTER — Inpatient Hospital Stay: Payer: Medicare HMO

## 2022-10-21 ENCOUNTER — Telehealth: Payer: Self-pay

## 2022-10-21 DIAGNOSIS — D6481 Anemia due to antineoplastic chemotherapy: Secondary | ICD-10-CM | POA: Diagnosis not present

## 2022-10-21 DIAGNOSIS — Z5111 Encounter for antineoplastic chemotherapy: Secondary | ICD-10-CM | POA: Diagnosis not present

## 2022-10-21 DIAGNOSIS — D702 Other drug-induced agranulocytosis: Secondary | ICD-10-CM

## 2022-10-21 DIAGNOSIS — Z5112 Encounter for antineoplastic immunotherapy: Secondary | ICD-10-CM | POA: Diagnosis not present

## 2022-10-21 DIAGNOSIS — C189 Malignant neoplasm of colon, unspecified: Secondary | ICD-10-CM

## 2022-10-21 DIAGNOSIS — D701 Agranulocytosis secondary to cancer chemotherapy: Secondary | ICD-10-CM | POA: Diagnosis not present

## 2022-10-21 DIAGNOSIS — C787 Secondary malignant neoplasm of liver and intrahepatic bile duct: Secondary | ICD-10-CM | POA: Diagnosis not present

## 2022-10-21 DIAGNOSIS — Z79899 Other long term (current) drug therapy: Secondary | ICD-10-CM | POA: Diagnosis not present

## 2022-10-21 DIAGNOSIS — C180 Malignant neoplasm of cecum: Secondary | ICD-10-CM | POA: Diagnosis not present

## 2022-10-21 LAB — CEA: CEA: 2.9 ng/mL (ref 0.0–4.7)

## 2022-10-21 MED ORDER — FILGRASTIM-SNDZ 480 MCG/0.8ML IJ SOSY
480.0000 ug | PREFILLED_SYRINGE | Freq: Once | INTRAMUSCULAR | Status: AC
  Administered 2022-10-21: 480 ug via SUBCUTANEOUS
  Filled 2022-10-21: qty 0.8

## 2022-10-21 NOTE — Telephone Encounter (Signed)
Authorization/ Referral request form faxed to [facility] for pt to be allowed to transfer care to [facility] in [place].

## 2022-10-21 NOTE — Patient Instructions (Signed)
Filgrastim Injection What is this medication? FILGRASTIM (fil GRA stim) lowers the risk of infection in people who are receiving chemotherapy. It works by Building control surveyor make more white blood cells, which protects your body from infection. It may also be used to help people who have been exposed to high doses of radiation. It can be used to help prepare your body before a stem cell transplant. It works by helping your bone marrow make and release stem cells into the blood. This medicine may be used for other purposes; ask your health care provider or pharmacist if you have questions. COMMON BRAND NAME(S): Neupogen, Nivestym, Releuko, Zarxio What should I tell my care team before I take this medication? They need to know if you have any of these conditions: History of blood diseases, such as sickle cell anemia Kidney disease Recent or ongoing radiation An unusual or allergic reaction to filgrastim, pegfilgrastim, latex, rubber, other medications, foods, dyes, or preservatives Pregnant or trying to get pregnant Breast-feeding How should I use this medication? This medication is injected under the skin or into a vein. It is usually given by your care team in a hospital or clinic setting. It may be given at home. If you get this medication at home, you will be taught how to prepare and give it. Use exactly as directed. Take it as directed on the prescription label at the same time every day. Keep taking it unless your care team tells you to stop. It is important that you put your used needles and syringes in a special sharps container. Do not put them in a trash can. If you do not have a sharps container, call your pharmacist or care team to get one. This medication comes with INSTRUCTIONS FOR USE. Ask your pharmacist for directions on how to use this medication. Read the information carefully. Talk to your pharmacist or care team if you have questions. Talk to your care team about the use of this  medication in children. While it may be prescribed for children for selected conditions, precautions do apply. Overdosage: If you think you have taken too much of this medicine contact a poison control center or emergency room at once. NOTE: This medicine is only for you. Do not share this medicine with others. What if I miss a dose? It is important not to miss any doses. Talk to your care team about what to do if you miss a dose. What may interact with this medication? Medications that may cause a release of neutrophils, such as lithium This list may not describe all possible interactions. Give your health care provider a list of all the medicines, herbs, non-prescription drugs, or dietary supplements you use. Also tell them if you smoke, drink alcohol, or use illegal drugs. Some items may interact with your medicine. What should I watch for while using this medication? Your condition will be monitored carefully while you are receiving this medication. You may need bloodwork while taking this medication. Talk to your care team about your risk of cancer. You may be more at risk for certain types of cancer if you take this medication. What side effects may I notice from receiving this medication? Side effects that you should report to your care team as soon as possible: Allergic reactions--skin rash, itching, hives, swelling of the face, lips, tongue, or throat Capillary leak syndrome--stomach or muscle pain, unusual weakness or fatigue, feeling faint or lightheaded, decrease in the amount of urine, swelling of the ankles, hands, or  feet, trouble breathing High white blood cell level--fever, fatigue, trouble breathing, night sweats, change in vision, weight loss Inflammation of the aorta--fever, fatigue, back, chest, or stomach pain, severe headache Kidney injury (glomerulonephritis)--decrease in the amount of urine, red or dark brown urine, foamy or bubbly urine, swelling of the ankles, hands, or  feet Shortness of breath or trouble breathing Spleen injury--pain in upper left stomach or shoulder Unusual bruising or bleeding Side effects that usually do not require medical attention (report to your care team if they continue or are bothersome): Back pain Bone pain Fatigue Fever Headache Nausea This list may not describe all possible side effects. Call your doctor for medical advice about side effects. You may report side effects to FDA at 1-800-FDA-1088. Where should I keep my medication? Keep out of the reach of children and pets. Keep this medication in the original packaging until you are ready to take it. Protect from light. See product for storage information. Each product may have different instructions. Get rid of any unused medication after the expiration date. To get rid of medications that are no longer needed or have expired: Take the medication to a medications take-back program. Check with your pharmacy or law enforcement to find a location. If you cannot return the medication, ask your pharmacist or care team how to get rid of this medication safely. NOTE: This sheet is a summary. It may not cover all possible information. If you have questions about this medicine, talk to your doctor, pharmacist, or health care provider.  2023 Elsevier/Gold Standard (2021-12-30 00:00:00)

## 2022-10-22 ENCOUNTER — Inpatient Hospital Stay: Payer: Medicare HMO

## 2022-10-22 DIAGNOSIS — D6481 Anemia due to antineoplastic chemotherapy: Secondary | ICD-10-CM | POA: Diagnosis not present

## 2022-10-22 DIAGNOSIS — D702 Other drug-induced agranulocytosis: Secondary | ICD-10-CM

## 2022-10-22 DIAGNOSIS — D701 Agranulocytosis secondary to cancer chemotherapy: Secondary | ICD-10-CM | POA: Diagnosis not present

## 2022-10-22 DIAGNOSIS — C189 Malignant neoplasm of colon, unspecified: Secondary | ICD-10-CM

## 2022-10-22 DIAGNOSIS — Z5112 Encounter for antineoplastic immunotherapy: Secondary | ICD-10-CM | POA: Diagnosis not present

## 2022-10-22 DIAGNOSIS — Z5111 Encounter for antineoplastic chemotherapy: Secondary | ICD-10-CM | POA: Diagnosis not present

## 2022-10-22 DIAGNOSIS — C787 Secondary malignant neoplasm of liver and intrahepatic bile duct: Secondary | ICD-10-CM | POA: Diagnosis not present

## 2022-10-22 DIAGNOSIS — Z79899 Other long term (current) drug therapy: Secondary | ICD-10-CM | POA: Diagnosis not present

## 2022-10-22 DIAGNOSIS — C180 Malignant neoplasm of cecum: Secondary | ICD-10-CM | POA: Diagnosis not present

## 2022-10-22 MED ORDER — FILGRASTIM-SNDZ 480 MCG/0.8ML IJ SOSY
480.0000 ug | PREFILLED_SYRINGE | Freq: Once | INTRAMUSCULAR | Status: AC
  Administered 2022-10-22: 480 ug via SUBCUTANEOUS
  Filled 2022-10-22: qty 0.8

## 2022-10-23 ENCOUNTER — Inpatient Hospital Stay: Payer: Medicare HMO

## 2022-10-23 DIAGNOSIS — D701 Agranulocytosis secondary to cancer chemotherapy: Secondary | ICD-10-CM | POA: Diagnosis not present

## 2022-10-23 DIAGNOSIS — D6481 Anemia due to antineoplastic chemotherapy: Secondary | ICD-10-CM | POA: Diagnosis not present

## 2022-10-23 DIAGNOSIS — C787 Secondary malignant neoplasm of liver and intrahepatic bile duct: Secondary | ICD-10-CM | POA: Diagnosis not present

## 2022-10-23 DIAGNOSIS — C180 Malignant neoplasm of cecum: Secondary | ICD-10-CM | POA: Diagnosis not present

## 2022-10-23 DIAGNOSIS — C189 Malignant neoplasm of colon, unspecified: Secondary | ICD-10-CM

## 2022-10-23 DIAGNOSIS — Z5112 Encounter for antineoplastic immunotherapy: Secondary | ICD-10-CM | POA: Diagnosis not present

## 2022-10-23 DIAGNOSIS — Z79899 Other long term (current) drug therapy: Secondary | ICD-10-CM | POA: Diagnosis not present

## 2022-10-23 DIAGNOSIS — D702 Other drug-induced agranulocytosis: Secondary | ICD-10-CM

## 2022-10-23 DIAGNOSIS — Z5111 Encounter for antineoplastic chemotherapy: Secondary | ICD-10-CM | POA: Diagnosis not present

## 2022-10-23 MED ORDER — FILGRASTIM-SNDZ 480 MCG/0.8ML IJ SOSY
480.0000 ug | PREFILLED_SYRINGE | Freq: Once | INTRAMUSCULAR | Status: AC
  Administered 2022-10-23: 480 ug via SUBCUTANEOUS
  Filled 2022-10-23: qty 0.8

## 2022-10-26 ENCOUNTER — Inpatient Hospital Stay (HOSPITAL_BASED_OUTPATIENT_CLINIC_OR_DEPARTMENT_OTHER): Payer: Medicare HMO | Admitting: Oncology

## 2022-10-26 ENCOUNTER — Inpatient Hospital Stay: Payer: Medicare HMO

## 2022-10-26 ENCOUNTER — Encounter: Payer: Self-pay | Admitting: Oncology

## 2022-10-26 VITALS — BP 139/91 | HR 79 | Temp 96.5°F | Resp 18

## 2022-10-26 DIAGNOSIS — G620 Drug-induced polyneuropathy: Secondary | ICD-10-CM

## 2022-10-26 DIAGNOSIS — C787 Secondary malignant neoplasm of liver and intrahepatic bile duct: Secondary | ICD-10-CM | POA: Diagnosis not present

## 2022-10-26 DIAGNOSIS — D709 Neutropenia, unspecified: Secondary | ICD-10-CM

## 2022-10-26 DIAGNOSIS — C180 Malignant neoplasm of cecum: Secondary | ICD-10-CM | POA: Diagnosis not present

## 2022-10-26 DIAGNOSIS — T451X5A Adverse effect of antineoplastic and immunosuppressive drugs, initial encounter: Secondary | ICD-10-CM

## 2022-10-26 DIAGNOSIS — C189 Malignant neoplasm of colon, unspecified: Secondary | ICD-10-CM

## 2022-10-26 DIAGNOSIS — D6481 Anemia due to antineoplastic chemotherapy: Secondary | ICD-10-CM | POA: Diagnosis not present

## 2022-10-26 DIAGNOSIS — Z79899 Other long term (current) drug therapy: Secondary | ICD-10-CM | POA: Diagnosis not present

## 2022-10-26 DIAGNOSIS — E876 Hypokalemia: Secondary | ICD-10-CM | POA: Diagnosis not present

## 2022-10-26 DIAGNOSIS — D701 Agranulocytosis secondary to cancer chemotherapy: Secondary | ICD-10-CM | POA: Diagnosis not present

## 2022-10-26 DIAGNOSIS — D702 Other drug-induced agranulocytosis: Secondary | ICD-10-CM

## 2022-10-26 DIAGNOSIS — Z5112 Encounter for antineoplastic immunotherapy: Secondary | ICD-10-CM | POA: Diagnosis not present

## 2022-10-26 DIAGNOSIS — Z5111 Encounter for antineoplastic chemotherapy: Secondary | ICD-10-CM | POA: Diagnosis not present

## 2022-10-26 LAB — CBC WITH DIFFERENTIAL/PLATELET
Abs Immature Granulocytes: 0.04 10*3/uL (ref 0.00–0.07)
Basophils Absolute: 0 10*3/uL (ref 0.0–0.1)
Basophils Relative: 1 %
Eosinophils Absolute: 0.1 10*3/uL (ref 0.0–0.5)
Eosinophils Relative: 2 %
HCT: 31.8 % — ABNORMAL LOW (ref 36.0–46.0)
Hemoglobin: 10.4 g/dL — ABNORMAL LOW (ref 12.0–15.0)
Immature Granulocytes: 1 %
Lymphocytes Relative: 25 %
Lymphs Abs: 1.6 10*3/uL (ref 0.7–4.0)
MCH: 33.3 pg (ref 26.0–34.0)
MCHC: 32.7 g/dL (ref 30.0–36.0)
MCV: 101.9 fL — ABNORMAL HIGH (ref 80.0–100.0)
Monocytes Absolute: 1 10*3/uL (ref 0.1–1.0)
Monocytes Relative: 15 %
Neutro Abs: 3.6 10*3/uL (ref 1.7–7.7)
Neutrophils Relative %: 56 %
Platelets: 146 10*3/uL — ABNORMAL LOW (ref 150–400)
RBC: 3.12 MIL/uL — ABNORMAL LOW (ref 3.87–5.11)
RDW: 16 % — ABNORMAL HIGH (ref 11.5–15.5)
WBC: 6.3 10*3/uL (ref 4.0–10.5)
nRBC: 0 % (ref 0.0–0.2)

## 2022-10-26 LAB — COMPREHENSIVE METABOLIC PANEL WITH GFR
ALT: 13 U/L (ref 0–44)
AST: 19 U/L (ref 15–41)
Albumin: 3.5 g/dL (ref 3.5–5.0)
Alkaline Phosphatase: 82 U/L (ref 38–126)
Anion gap: 12 (ref 5–15)
BUN: 12 mg/dL (ref 8–23)
CO2: 28 mmol/L (ref 22–32)
Calcium: 9.1 mg/dL (ref 8.9–10.3)
Chloride: 98 mmol/L (ref 98–111)
Creatinine, Ser: 0.79 mg/dL (ref 0.44–1.00)
GFR, Estimated: >60 mL/min
Glucose, Bld: 95 mg/dL (ref 70–99)
Potassium: 3.2 mmol/L — ABNORMAL LOW (ref 3.5–5.1)
Sodium: 138 mmol/L (ref 135–145)
Total Bilirubin: 0.2 mg/dL — ABNORMAL LOW (ref 0.3–1.2)
Total Protein: 7.6 g/dL (ref 6.5–8.1)

## 2022-10-26 LAB — PROTEIN, URINE, RANDOM: Total Protein, Urine: 6 mg/dL

## 2022-10-26 MED ORDER — PROCHLORPERAZINE MALEATE 10 MG PO TABS
10.0000 mg | ORAL_TABLET | Freq: Once | ORAL | Status: AC
  Administered 2022-10-26: 10 mg via ORAL
  Filled 2022-10-26: qty 1

## 2022-10-26 MED ORDER — SODIUM CHLORIDE 0.9 % IV SOLN
2400.0000 mg/m2 | INTRAVENOUS | Status: DC
  Administered 2022-10-26: 3900 mg via INTRAVENOUS
  Filled 2022-10-26: qty 78

## 2022-10-26 MED ORDER — SODIUM CHLORIDE 0.9 % IV SOLN
Freq: Once | INTRAVENOUS | Status: AC
  Filled 2022-10-26: qty 250

## 2022-10-26 MED ORDER — SODIUM CHLORIDE 0.9 % IV SOLN
5.0000 mg/kg | Freq: Once | INTRAVENOUS | Status: AC
  Administered 2022-10-26: 350 mg via INTRAVENOUS
  Filled 2022-10-26: qty 14

## 2022-10-26 MED ORDER — LEUCOVORIN CALCIUM INJECTION 350 MG
400.0000 mg/m2 | Freq: Once | INTRAVENOUS | Status: AC
  Administered 2022-10-26: 660 mg via INTRAVENOUS
  Filled 2022-10-26: qty 33

## 2022-10-26 NOTE — Assessment & Plan Note (Signed)
She will get Udenyca on D3 of this cycle.

## 2022-10-26 NOTE — Patient Instructions (Signed)
[facility]  Discharge Instructions: Thank you for choosing [facility] to provide your oncology and hematology care.  If you have a lab appointment with the [facility], please go directly to the [facility] and check in at the registration area.  Wear comfortable clothing and clothing appropriate for easy access to any Portacath or PICC line.   We strive to give you quality time with your provider. You may need to reschedule your appointment if you arrive late (15 or more minutes).  Arriving late affects you and other patients whose appointments are after yours.  Also, if you miss three or more appointments without notifying the office, you may be dismissed from the clinic at the provider's discretion.      For prescription refill requests, have your pharmacy contact our office and allow 72 hours for refills to be completed.    Today you received the following chemotherapy and/or immunotherapy agents LEUCOVOIRN, 5FU and ZIRABEV      To help prevent nausea and vomiting after your treatment, we encourage you to take your nausea medication as directed.  BELOW ARE SYMPTOMS THAT SHOULD BE REPORTED IMMEDIATELY: *FEVER GREATER THAN 100.4 F (38 C) OR HIGHER *CHILLS OR SWEATING *NAUSEA AND VOMITING THAT IS NOT CONTROLLED WITH YOUR NAUSEA MEDICATION *UNUSUAL SHORTNESS OF BREATH *UNUSUAL BRUISING OR BLEEDING *URINARY PROBLEMS (pain or burning when urinating, or frequent urination) *BOWEL PROBLEMS (unusual diarrhea, constipation, pain near the anus) TENDERNESS IN MOUTH AND THROAT WITH OR WITHOUT PRESENCE OF ULCERS (sore throat, sores in mouth, or a toothache) UNUSUAL RASH, SWELLING OR PAIN  UNUSUAL VAGINAL DISCHARGE OR ITCHING   Items with * indicate a potential emergency and should be followed up as soon as possible or go to the Emergency Department if any problems should occur.  Please show the CHEMOTHERAPY ALERT CARD or IMMUNOTHERAPY ALERT  CARD at check-in to the Emergency Department and triage nurse.  Should you have questions after your visit or need to cancel or reschedule your appointment, please contact [facility]  [phone] and follow the prompts.  Office hours are 8:00 a.m. to 4:30 p.m. Monday - Friday. Please note that voicemails left after 4:00 p.m. may not be returned until the following business day.  We are closed weekends and major holidays. You have access to a nurse at all times for urgent questions. Please call the main number to the clinic [phone] and follow the prompts.  For any non-urgent questions, you may also contact your provider using MyChart. We now offer e-Visits for anyone 59 and older to request care online for non-urgent symptoms. For details visit [email or web address].   Also download the MyChart app! Go to the app store, search "MyChart", open the app, select [facility], and log in with your MyChart username and password.

## 2022-10-26 NOTE — Progress Notes (Signed)
Hematology/Oncology Progress note [phone] [phone]         Patient Care Team: Jearld Fenton, NP as PCP - General (Internal Medicine) Clent Jacks, RN as Oncology Nurse Navigator [name], MD as Consulting Physician (Oncology)  ASSESSMENT & PLAN:   Cancer Staging  Metastatic colon cancer to liver [facility]) Staging form: Colon and Rectum, AJCC 8th Edition - Clinical stage from 09/04/2021: Stage Unknown (cTX, cNX, cM1) - Signed by [name], MD on 05/13/2022   Metastatic colon cancer to liver [facility]) #Metastatic colon cancer to liver, 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  No sufficient tissue for NGS Liquid biopsy showed KRAS12V, APC, TP53 mutation, TMB 4.  S/p 6 cycles of 5-FU /Bevacizumab--> CT showed mixed response--> FOLFOX/bevacizumab.-->CT imaging shows stable disease --> dc oxaliplatin, 5-FU/Bev maintenance 09/07/22 Interval CT showed stable disease.  Continue current regimen. Labs reviewed and discussed with patient ANC improves, she will proceed with chemotherapy today.  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  Stable disease.  Anemia due to antineoplastic chemotherapy Hemoglobin is stable.  Continue monitor.    Chemotherapy-induced neuropathy (HCC) Continue gabapentin 300 mg 3 times daily.  Encounter for antineoplastic chemotherapy Chemotherapy plan as listed above  Chemotherapy induced neutropenia ([place]) She will get Udenyca on D3 of this cycle.   Hypokalemia K 3.2, recommend patient to take potassium chloride 88mq BID.   Follow[place]up [place] This is her last treatment with our cancer center. No additional appointment is scheduled. I recommend daughter to contact our medical record department to obtain her records and bring to her future oncologist's visit.   All questions were answered. The patient knows to call the clinic with any problems, questions or concerns.  [name],  PhD [facility] Oncology 10/26/2022   CHIEF COMPLAINTS/REASON FOR VISIT:  metastatic colon cancer  HISTORY OF PRESENTING ILLNESS:   Chelsea MENDOLIAis a  77y.o.  female presents for management of metastatic colon cancer. Oncology History  Metastatic colon cancer to liver ([facility]  08/21/2021 Imaging   CT abdomen pelvis with contrast  showed soft tissue mass at the base of cecum measuring up to 5 cm, slightly increased in size.  Interval development of multiple low-density lesions throughout the liver highly suggestive for metastatic disease.  Complex mass in the region of the vaginal cuff measuring up to 5.2 cm, increased in size.  Right ovary also appears more prominent on the current examination.  Moderate large volume of stool throughout the colon.  Mild urinary bladder wall thickening.  Colonic diverticulosis without evidence of active diverticulitis.  Aortic atherosclerosis   09/04/2021 Procedure   patient is status post left lobe liver lesion biopsy and pathology showed metastatic adenocarcinoma, compatible with colorectal primary.   09/04/2021 Cancer Staging   Staging form: Colon and Rectum, AJCC 8th Edition - Clinical stage from 09/04/2021: Stage Unknown (cTX, cNX, cM1) - Signed by YEarlie Server MD on 05/13/2022 Stage prefix: Initial diagnosis   09/16/2021 Initial Diagnosis   Metastatic colon cancer to liver ([facility] one liquid biopsy testing showed KRASG12V, APC TP53 TMB 4  Patient initially went to emergency room on 03/24/2021 for abdominal pain. 03/24/2021, CT scan of the abdomen showed marked bladder wall thickening with surrounding soft tissue stranding is noted.  Concerning for cystitis.  There is a lobulated mass between the posterior wall of the bladder and the rectum which appears to arise from the vaginal cuff.  This is indeterminate and  difficult to characterize reflecting lack of IV contrast material.  Nonobstructing left renal calculus, left sacroiliitis,  lumbar spondylosis aortic atherosclerosis. 03/24/2021 CT pelvic showed abdominal [place] soft tissue of right cecum, concerning for colon carcinoma.  Colonoscopy recommended.  Low-attenuation mass in the region of vaginal cuff is again noted.  Diffuse urinary bladder wall thickening and mild bilateral hydroureter possibly cystitis.   [place] 03/24/2021 Lobulated solid 3.8 cm mass confirmed by ultrasound. This is most compatible with a neoplasm but remains indeterminate as to the origin as the epicenter seems to be outside of both the vaginal cuff and the adjacent colon, while the lesion appears inseparable from both. Extensive echogenic debris within the distended urinary bladder.   03/26/2021, patient was seen by Dr. Theora Gianotti for evaluation of colonic/vaginal cuff pelvic mass.  Patient also was referred to gastroenterology for colonoscopy.   03/26/2021 CEA 3.4.  CA125 9.7 04/04/2021, status post colonoscopy which showed a frond like /villous nonobstructing large mass was found in the cecum, this was biopsied.  Terminal ileum was briefly intubated and appeared normal.  Diverticulosis in the sigmoid colon.  The rectum, sigmoid colon, descending colon, transverse colon and ascending colon were normal.  Nonbleeding internal hemorrhoids. 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 Given that the biopsy may not be representative of the entire underlying lesion.  Patient was recommended to establish with surgeon for evaluation. Patient no showed for her surgery appointment and as well as her follow-up appointment with gastroenterology.  Per daughter, patient was having cervical spine surgery and she prioritized that over the colon surgery.   10/20/2021 - 01/07/2022 Chemotherapy    COLORECTAL 5-FU  + Bevacizumab q14d      10/20/2021 -  Chemotherapy   Patient is on Treatment Plan : COLORECTAL 5FU + Leucovorin (Modified [place]) + Bevacizumab q14d     12/22/2021 Imaging   CT chest abdomen  pelvis Decreased cecum mass, however increased size and size of metastatic lesions throughout the liver.  Interval enlargement of vaginal cuff mass. No mets in chest.    01/21/2022 -  Chemotherapy   COLORECTAL FOLFOX  + Bevacizumab q14d    patient was seen by gynecology oncology Dr. Fransisca Connors.  Biopsy of the vaginal mass was not recommended.Patient's case was also discussed on multidisciplinary tumor board.  IR potentially can try a biopsy if patient is willing to.I had a discussion with patient's daughter over the phone prior to this visit.  We discussed about option of adding oxaliplatin to 5-FU/bevacizumab and continue treatment of colon cancer, repeat CT scan short-term and if progression, repeat biopsy versus proceeding with vaginal mass biopsy.  Daughter prefers proceeding with chemotherapy and defer biopsy.   01/08/2022 patient has had a second opinion at [facility] and was seen by Dr. Reynaldo Minium .  Dr. Reynaldo Minium agrees with the current treatment plan with FOLFOX/bevacizumab.  He has ordered guardant 360 to see if she may be eligible for clinical trial in the future.  -   04/13/2022 Imaging    CT chest abdomen pelvis showed a cecal mass with stable hepatic metastasis.  Mixed cystic and solid mass in the region of the vaginal cuff, new area of patchy groundglass and consolidation in the right upper lobe, lingula and left lower lobe.  Likely due to pneumonia.  Hepatic steatosis.  Bilateral renal stones.  Aortic atherosclerosis   06/24/2022 Imaging   CT abdomen pelvis with contrast showed There are low-density lesions in liver consistent with metastatic disease with no significant change. Wall thickening  in the cecum appears less prominent. There is 5.5 x 4.2 cm mixed density lesion in vaginal cuff suggesting possible metastatic disease.   There is no evidence of intestinal obstruction or pneumoperitoneum.There is no hydronephrosis.   Small patchy infiltrates are seen in the lower lung fields suggesting  atelectasis/pneumonia.   Small bilateral renal stones. Diverticulosis of colon without signs of diverticulitis. 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 There is encroachment of neural foramina at L4-L5 and L5-S1 levels.     06/25/2022 Imaging   CT soft tissue neck with contrast showed1No acute process in the neck to explain the patient's difficulty swallowing.  CT chest contrast showed 1. Patchy areas of airspace opacity in the right upper lobe and left lower lobe and lingula slightly progressed since the prior radiograph and may represent worsening or recurrent pneumonia. Clinical correlation and follow-up to resolution recommended. 2. Aortic Atherosclerosis    10/14/2022 Imaging   CT chest abdomen pelvis with contrast showed Stable small hypovascular liver metastases.  Stable mass in pelvic cul-de-sac involving the vaginal cuff,consistent with metastatic disease.  No new or progressive metastatic disease within the chest, abdomen,or pelvis. Stable bilateral bilateral airspace disease, suspicious for pneumonia or inflammatory etiology.    06/24/2022 - 06/26/2022 patient was hospitalized due to difficulty swallowing, 1 day of abdominal pain.  Patient had extensive work-up including EGD which showed erosive gastritis.  No significant esophageal or gastric abnormality.  CT scan showed possible aspiration induced pneumonitis.  Antibiotics was held.  Today patient denies any fever, chills,.  Occasionally she has cough.   INTERVAL HISTORY MARAGRET VANACKER is a 77 y.o. female who has above history reviewed by me today presents for follow up visit for management of metastatic colon cancer Patient was accompanied by daughter. + fatigue stable.  + appetite is fair.  No nausea vomiting diarrhea   Right side pre-existing neuropathy . On gabapentin.. No other new complaints.   Review of Systems  Constitutional:  Positive for fatigue. Negative for appetite change, chills,  fever and unexpected weight change.  HENT:   Negative for hearing loss and voice change.   Eyes:  Negative for eye problems.  Respiratory:  Negative for chest tightness and cough.   Cardiovascular:  Negative for chest pain.  Gastrointestinal:  Negative for abdominal distention, abdominal pain, blood in stool and diarrhea.  Endocrine: Negative for hot flashes.  Genitourinary:  Negative for difficulty urinating and frequency.   Musculoskeletal:  Positive for back pain. Negative for arthralgias.  Skin:  Negative for itching and rash.  Neurological:  Positive for numbness (chornic finger tips). Negative for extremity weakness.  Hematological:  Negative for adenopathy.  Psychiatric/Behavioral:  Negative for confusion.     MEDICAL HISTORY:  Past Medical History:  Diagnosis Date   Anxiety    Arthritis    Depression    Hyperlipidemia    Hypertension    Liver mass    Metastatic colon cancer to liver ([place])    Moderate mitral insufficiency    Port-A-Cath in place    Seizures [facility])    Spinal stenosis    Ulcer    Urinary incontinence     SURGICAL HISTORY: Past Surgical History:  Procedure Laterality Date   ABDOMINAL HYSTERECTOMY     BACK SURGERY     due to polio   BREAST BIOPSY Bilateral    neg   BREAST BIOPSY Right 2011   neg/stereo   CARPAL TUNNEL RELEASE     CATARACT EXTRACTION Right  2020   COLONOSCOPY WITH PROPOFOL N/A 04/04/2021   Procedure: COLONOSCOPY WITH PROPOFOL;  Surgeon: Virgel Manifold, MD;  Location: [facility] ENDOSCOPY;  Service: Endoscopy;  Laterality: N/A;   ESOPHAGOGASTRODUODENOSCOPY (EGD) WITH PROPOFOL N/A 06/25/2022   Procedure: ESOPHAGOGASTRODUODENOSCOPY (EGD) WITH PROPOFOL;  Surgeon: Lin Landsman, MD;  Location: [facility] ENDOSCOPY;  Service: Gastroenterology;  Laterality: N/A;   EYE SURGERY     IR IMAGING GUIDED PORT INSERTION  10/09/2021    SOCIAL HISTORY: Social History   Socioeconomic History   Marital status: Divorced    Spouse name: Not on file    Number of children: Not on file   Years of education: Not on file   Highest education level: Not on file  Occupational History   Not on file  Tobacco Use   Smoking status: Never   Smokeless tobacco: Never  Vaping Use   Vaping Use: Never used  Substance and Sexual Activity   Alcohol use: Never   Drug use: No   Sexual activity: Not Currently  Other Topics Concern   Not on file  Social History Narrative   Not on file   Social Determinants of Health   Financial Resource Strain: Low Risk  (01/12/2022)   Overall Financial Resource Strain (CARDIA)    Difficulty of Paying Living Expenses: Not very hard  Food Insecurity: No Food Insecurity (06/24/2022)   Hunger Vital Sign    Worried About Running Out of Food in the Last Year: Never true    Ran Out of Food in the Last Year: Never true  Transportation Needs: No Transportation Needs (06/24/2022)   PRAPARE - Hydrologist (Medical): No    Lack of Transportation (Non-Medical): No  Physical Activity: Inactive (01/12/2022)   Exercise Vital Sign    Days of Exercise per Week: 0 days    Minutes of Exercise per Session: 0 min  Stress: Stress Concern Present (01/12/2022)   [place]    Feeling of Stress : To some extent  Social Connections: Moderately Integrated (01/12/2022)   Social Connection and Isolation Panel [NHANES]    Frequency of Communication with Friends and Family: Three times a week    Frequency of Social Gatherings with Friends and Family: Three times a week    Attends Religious Services: 1 to 4 times per year    Active Member of Clubs or Organizations: No    Attends Archivist Meetings: 1 to 4 times per year    Marital Status: Widowed  Intimate Partner Violence: Not At Risk (06/24/2022)   Humiliation, Afraid, Rape, and Kick questionnaire    Fear of Current or Ex-Partner: No    Emotionally Abused: No    Physically Abused: No     Sexually Abused: No    FAMILY HISTORY: Family History  Problem Relation Age of Onset   Arthritis Mother    Heart disease Mother    Stroke Mother    Hypertension Mother    COPD Mother    Sudden death Sister    Other Father        unknown medical history   Breast cancer Neg Hx     ALLERGIES:  is allergic to penicillins.  MEDICATIONS:  Current Outpatient Medications  Medication Sig Dispense Refill   acetaminophen (TYLENOL) 500 MG tablet Take 500 mg by mouth every 6 (six) hours as needed.     ALPRAZolam (XANAX) 0.5 MG tablet TAKE 1 TABLET BY MOUTH TWICE  A DAY AS NEEDED FOR ANXIETY 10.11.23 60 tablet 0   aspirin EC 81 MG tablet Take 1 tablet (81 mg total) by mouth daily. Swallow whole. 30 tablet 12   atorvastatin (LIPITOR) 10 MG tablet TAKE 1 TABLET BY MOUTH EVERY DAY 90 tablet 0   busPIRone (BUSPAR) 5 MG tablet TAKE 1 TABLET BY MOUTH THREE TIMES A DAY 270 tablet 0   Cyanocobalamin (VITAMIN B 12 PO) Take 500 mcg by mouth daily.     diclofenac Sodium (VOLTAREN) 1 % GEL Apply 4 g topically 4 (four) times daily. 4 g 0   docusate sodium (COLACE) 100 MG capsule Take 1 capsule (100 mg total) by mouth 2 (two) times daily. 60 capsule 0   FLUoxetine (PROZAC) 20 MG capsule TAKE 3 CAPSULES (60 MG TOTAL) BY MOUTH EVERY MORNING. 270 capsule 0   gabapentin (NEURONTIN) 300 MG capsule Take 1 capsule (300 mg total) by mouth 3 (three) times daily. 90 capsule 3   lidocaine-prilocaine (EMLA) cream Apply small amount of cream to port site 1-2 hour prior to chemo treatment. 30 g 6   loperamide (IMODIUM) 2 MG capsule TAKE 1 CAP BY MOUTH SEE ADMIN INSTRUCTIONS. INITIAL: 4 MG, FOLLOWED BY 2 MG AFTER EACH LOOSE STOOL MAXIMUM: 16 MG/DAY 60 capsule 0   loratadine (CLARITIN) 10 MG tablet Take 1 tablet (10 mg total) by mouth See admin instructions. Take 1 tablet daily for 4 days after each chemotherapy treatments. 90 tablet 0   meclizine (ANTIVERT) 12.5 MG tablet Take 1 tablet (12.5 mg total) by mouth 3 (three)  times daily as needed for dizziness. 30 tablet 0   methocarbamol (ROBAXIN) 500 MG tablet TAKE 1 TABLET TWICE DAILY AS NEEDED FOR MUSCLE SPASM(S) 180 tablet 0   ondansetron (ZOFRAN) 8 MG tablet Take 1 tablet (8 mg total) by mouth 2 (two) times daily as needed (Nausea or vomiting). 30 tablet 1   polyethylene glycol powder (GLYCOLAX/MIRALAX) 17 GM/SCOOP powder Take 17 g by mouth daily. 3350 g 1   potassium chloride SA (KLOR-CON M) 20 MEQ tablet Take 2 tablets (40 mEq total) by mouth daily. 90 tablet 0   sucralfate (CARAFATE) 1 GM/10ML suspension Take 10 mLs (1 g total) by mouth 4 (four) times daily -  with meals and at bedtime. 420 mL 0   triamterene-hydrochlorothiazide (MAXZIDE-25) 37.5-25 MG tablet Take 1 tablet by mouth daily. 90 tablet 0   No current facility-administered medications for this visit.   Facility-Administered Medications Ordered in Other Visits  Medication Dose Route Frequency Provider Last Rate Last Admin   0.9 %  sodium chloride infusion   Intravenous Once [name], MD       fluorouracil (ADRUCIL) 3,900 mg in sodium chloride 0.9 % 72 mL chemo infusion  2,400 mg/m2 (Order-Specific) Intravenous 1 day or 1 dose [name], MD       heparin lock flush 100 UNIT/ML injection            leucovorin 660 mg in dextrose 5 % 250 mL infusion  400 mg/m2 (Order-Specific) Intravenous Once [name], MD       palonosetron (ALOXI) 0.25 MG/5ML injection            prochlorperazine (COMPAZINE) 10 MG tablet              PHYSICAL EXAMINATION: ECOG PERFORMANCE STATUS: 2 - Symptomatic, <50% confined to bed Vitals:   10/26/22 1001  BP: (!) 139/91  Pulse: 79  Resp: 18  Temp: (!) 96.5 F (35.8  C)  SpO2: 99%    There were no vitals filed for this visit.     Physical Exam Constitutional:      General: She is not in acute distress.    Comments: Patient sits in wheelchair  HENT:     Head: Normocephalic and atraumatic.  Eyes:     General: No scleral icterus. Cardiovascular:     Rate and  Rhythm: Normal rate and regular rhythm.     Heart sounds: Normal heart sounds.  Pulmonary:     Effort: Pulmonary effort is normal. No respiratory distress.     Breath sounds: No wheezing.  Abdominal:     General: Bowel sounds are normal. There is no distension.     Palpations: Abdomen is soft.  Musculoskeletal:        General: No deformity. Normal range of motion.     Cervical back: Normal range of motion and neck supple.  Skin:    General: Skin is warm and dry.     Findings: No erythema or rash.  Neurological:     Mental Status: She is alert and oriented to person, place, and time. Mental status is at baseline.     Cranial Nerves: No cranial nerve deficit.     Coordination: Coordination normal.  Psychiatric:        Mood and Affect: Mood normal.     LABORATORY DATA:  I have reviewed the data as listed     Latest Ref Rng & Units 10/26/2022    9:25 AM 10/20/2022   12:57 PM 10/06/2022    8:45 AM  CBC  WBC 4.0 - 10.5 K/uL 6.3  2.5  3.0   Hemoglobin 12.0 - 15.0 g/dL 10.4  9.7  9.9   Hematocrit 36.0 - 46.0 % 31.8  31.1  31.7   Platelets 150 - 400 K/uL 146  123  144       Latest Ref Rng & Units 10/26/2022    9:25 AM 10/20/2022   12:57 PM 10/06/2022    8:45 AM  CMP  Glucose 70 - 99 mg/dL 95  99  119   BUN 8 - 23 mg/dL '12  14  13   '$ Creatinine 0.44 - 1.00 mg/dL 0.79  0.80  0.86   Sodium 135 - 145 mmol/L 138  139  138   Potassium 3.5 - 5.1 mmol/L 3.2  3.5  3.5   Chloride 98 - 111 mmol/L 98  101  104   CO2 22 - 32 mmol/L '28  30  27   '$ Calcium 8.9 - 10.3 mg/dL 9.1  8.8  9.0   Total Protein 6.5 - 8.1 g/dL 7.6  6.8  6.9   Total Bilirubin 0.3 - 1.2 mg/dL 0.2  0.4  0.8   Alkaline Phos 38 - 126 U/L 82  55  61   AST 15 - 41 U/L '19  18  26   '$ ALT 0 - 44 U/L '13  11  11      '$ RADIOGRAPHIC STUDIES: I have personally reviewed the radiological images as listed and agreed with the findings in the report. CT CHEST ABDOMEN PELVIS W CONTRAST  Result Date: 10/14/2022 CLINICAL DATA:  Follow-up  metastatic colon carcinoma. Currently undergoing chemotherapy. Restaging. * Tracking Code: BO * EXAM: CT CHEST, ABDOMEN, AND PELVIS WITH CONTRAST TECHNIQUE: Multidetector CT imaging of the chest, abdomen and pelvis was performed following the standard protocol during bolus administration of intravenous contrast. RADIATION DOSE REDUCTION: This exam was performed according to  the departmental dose-optimization program which includes automated exposure control, adjustment of the mA and/or kV according to patient size and/or use of iterative reconstruction technique. CONTRAST:  169m OMNIPAQUE IOHEXOL 300 MG/ML  SOLN COMPARISON:  Chest CT on 06/25/2022, and AP CT on 06/24/2022 FINDINGS: CT CHEST FINDINGS Cardiovascular: No acute findings. Mediastinum/Lymph Nodes: No masses or pathologically enlarged lymph nodes identified. Lungs/Pleura: Airspace disease is again seen in the medial right upper lobe, lingula, and posterior left lower lobe, without significant change. No suspicious pulmonary nodules or masses are identified. No evidence of pleural effusion. Musculoskeletal:  No suspicious bone lesions identified. CT ABDOMEN AND PELVIS FINDINGS Hepatobiliary: Multiple small hypovascular masses are again seen in the left and right hepatic lobes, largest measuring approximately 2 cm in segment 2. These show no significant change. Gallbladder is unremarkable. No evidence of biliary ductal dilatation. Pancreas:  No mass or inflammatory changes. Spleen:  Within normal limits in size and appearance. Adrenals/Urinary tract: No suspicious masses or hydronephrosis. Stable mild bilateral renal parenchymal scarring. Stable small left posterior bladder diverticulum. Stomach/Bowel: No evidence of obstruction, inflammatory process, or abnormal fluid collections. Vascular/Lymphatic: No pathologically enlarged lymph nodes identified. No acute vascular findings. Reproductive: No uterus is seen. A low-attenuation mass is again seen in the  pelvic cul-de-sac which involves the vaginal cuff. This measures 5.1 x 3.6 cm on image 82/2, and shows no significant change compared to prior exam. No evidence of ascites. Other:  None. Musculoskeletal: No suspicious bone lesions identified. 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 IMPRESSION: Stable small hypovascular liver metastases. Stable mass in pelvic cul-de-sac involving the vaginal cuff, consistent with metastatic disease. No new or progressive metastatic disease within the chest, abdomen, or pelvis. Stable bilateral bilateral airspace disease, suspicious for pneumonia or inflammatory etiology. Electronically Signed   By: JMarlaine HindM.D.   On: 10/14/2022 08:55   MM DIAG BREAST TOMO UNI LEFT  Result Date: 07/31/2022 CLINICAL DATA:  The lung 77year old female recalled from screening mammogram dated 07/09/2022 for a possible left breast mass. EXAM: DIGITAL DIAGNOSTIC UNILATERAL LEFT MAMMOGRAM WITH TOMOSYNTHESIS; ULTRASOUND LEFT BREAST LIMITED TECHNIQUE: Left digital diagnostic mammography and breast tomosynthesis was performed.; Targeted ultrasound examination of the left breast was performed. COMPARISON:  Previous exam(s). ACR Breast Density Category b: There are scattered areas of fibroglandular density. FINDINGS: There is a persistent oval, circumscribed equal density mass in the upper outer left breast at mid depth. Note is made of associated coarse calcifications. Targeted ultrasound is performed, showing an oval, circumscribed hypoechoic mass with internal calcifications at the 2 o'clock position 10 cm from the nipple. It measures 8 x 4 x 7 mm without significant internal vascularity. This correlates well with the mammographic finding. 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 Recommend short-term follow-up. RECOMMENDATION: Left breast ultrasound in 6 months. I have discussed the findings and recommendations with  the patient. If applicable, a reminder letter will be sent to the patient regarding the next appointment. BI-RADS CATEGORY  3: Probably benign. Electronically Signed   By: SKristopher OppenheimM.D.   On: 07/31/2022 12:06  [place]AST LTD UNI LEFT INC AXILLA  Result Date: 07/31/2022 CLINICAL DATA:  The lung 77year old female recalled from screening mammogram dated 07/09/2022 for a possible left breast mass. EXAM: DIGITAL DIAGNOSTIC UNILATERAL LEFT MAMMOGRAM WITH TOMOSYNTHESIS; ULTRASOUND LEFT BREAST LIMITED TECHNIQUE: Left digital diagnostic mammography and breast tomosynthesis was performed.; Targeted ultrasound examination of the left breast was performed. COMPARISON:  Previous  exam(s). ACR Breast Density Category b: There are scattered areas of fibroglandular density. FINDINGS: There is a persistent oval, circumscribed equal density mass in the upper outer left breast at mid depth. Note is made of associated coarse calcifications. Targeted ultrasound is performed, showing an oval, circumscribed hypoechoic mass with internal calcifications at the 2 o'clock position 10 cm from the nipple. It measures 8 x 4 x 7 mm without significant internal vascularity. This correlates well with the mammographic finding. 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 Recommend short-term follow-up. RECOMMENDATION: Left breast ultrasound in 6 months. I have discussed the findings and recommendations with the patient. If applicable, a reminder letter will be sent to the patient regarding the next appointment. BI-RADS CATEGORY  3: Probably benign. Electronically Signed   By: Kristopher Oppenheim M.D.   On: 07/31/2022 12:06

## 2022-10-26 NOTE — Assessment & Plan Note (Signed)
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  No sufficient tissue for NGS Liquid biopsy showed KRAS12V, APC, TP53 mutation, TMB 4.  S/p 6 cycles of 5-FU /Bevacizumab--> CT showed mixed response--> FOLFOX/bevacizumab.-->CT imaging shows stable disease --> dc oxaliplatin, 5-FU/Bev maintenance 09/07/22 Interval CT showed stable disease.  Continue current regimen. Labs reviewed and discussed with patient ANC improves, she will proceed with chemotherapy today.  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  Stable disease.

## 2022-10-26 NOTE — Assessment & Plan Note (Signed)
K 3.2, recommend patient to take potassium chloride 3mq BID.

## 2022-10-26 NOTE — Addendum Note (Signed)
Addended by: [name] on: 10/26/2022 03:18 PM   Modules accepted: Orders

## 2022-10-26 NOTE — Assessment & Plan Note (Signed)
Hemoglobin is stable.  Continue monitor.   

## 2022-10-26 NOTE — Assessment & Plan Note (Signed)
Continue gabapentin 300 mg 3 times daily. 

## 2022-10-26 NOTE — Assessment & Plan Note (Signed)
Chemotherapy plan as listed above 

## 2022-10-28 ENCOUNTER — Inpatient Hospital Stay: Payer: Medicare HMO

## 2022-10-28 VITALS — BP 134/87 | HR 91 | Temp 97.1°F | Resp 18

## 2022-10-28 DIAGNOSIS — D6481 Anemia due to antineoplastic chemotherapy: Secondary | ICD-10-CM | POA: Diagnosis not present

## 2022-10-28 DIAGNOSIS — Z5111 Encounter for antineoplastic chemotherapy: Secondary | ICD-10-CM | POA: Diagnosis not present

## 2022-10-28 DIAGNOSIS — C787 Secondary malignant neoplasm of liver and intrahepatic bile duct: Secondary | ICD-10-CM | POA: Diagnosis not present

## 2022-10-28 DIAGNOSIS — D702 Other drug-induced agranulocytosis: Secondary | ICD-10-CM

## 2022-10-28 DIAGNOSIS — Z79899 Other long term (current) drug therapy: Secondary | ICD-10-CM | POA: Diagnosis not present

## 2022-10-28 DIAGNOSIS — C180 Malignant neoplasm of cecum: Secondary | ICD-10-CM | POA: Diagnosis not present

## 2022-10-28 DIAGNOSIS — C189 Malignant neoplasm of colon, unspecified: Secondary | ICD-10-CM

## 2022-10-28 DIAGNOSIS — D701 Agranulocytosis secondary to cancer chemotherapy: Secondary | ICD-10-CM | POA: Diagnosis not present

## 2022-10-28 DIAGNOSIS — Z5112 Encounter for antineoplastic immunotherapy: Secondary | ICD-10-CM | POA: Diagnosis not present

## 2022-10-28 MED ORDER — PEGFILGRASTIM-CBQV 6 MG/0.6ML ~~LOC~~ SOSY
6.0000 mg | PREFILLED_SYRINGE | Freq: Once | SUBCUTANEOUS | Status: AC
  Administered 2022-10-28: 6 mg via SUBCUTANEOUS

## 2022-10-28 MED ORDER — HEPARIN SOD (PORK) LOCK FLUSH 100 UNIT/ML IV SOLN
500.0000 [IU] | Freq: Once | INTRAVENOUS | Status: AC | PRN
  Administered 2022-10-28: 500 [IU]
  Filled 2022-10-28: qty 5

## 2022-10-30 ENCOUNTER — Other Ambulatory Visit: Payer: Self-pay | Admitting: Internal Medicine

## 2022-10-30 ENCOUNTER — Other Ambulatory Visit: Payer: Self-pay | Admitting: Oncology

## 2022-10-30 DIAGNOSIS — F411 Generalized anxiety disorder: Secondary | ICD-10-CM

## 2022-10-30 NOTE — Telephone Encounter (Signed)
Requested medications are due for refill today.  Provider to determine  Requested medications are on the active medications list.  yes  Last refill. 10/06/2022 #60 0 rf  Future visit scheduled.   no  Notes to clinic.  Refill not delegated.    Requested Prescriptions  Pending Prescriptions Disp Refills   ALPRAZolam (XANAX) 0.5 MG tablet [Pharmacy Med Name: ALPRAZOLAM 0.5 MG TABLET] 60 tablet 0    Sig: TAKE 1 TABLET BY MOUTH TWICE A DAY AS NEEDED FOR ANXIETY 10.11.23     Not Delegated - Psychiatry: Anxiolytics/Hypnotics 2 Failed - 10/30/2022  4:55 PM      Failed - This refill cannot be delegated      Failed - Urine Drug Screen completed in last 360 days      Passed - Patient is not pregnant      Passed - Valid encounter within last 6 months    Recent Outpatient Visits           2 weeks ago Pure hypercholesterolemia   [facility] [place], Coralie Keens, NP   3 months ago Erosive gastritis   [facility] [place], Coralie Keens, NP   6 months ago Encounter for general adult medical examination with abnormal findings   [facility] [place], Coralie Keens, NP   8 months ago Right leg weakness   [facility] [place], Coralie Keens, NP   1 year ago Pure hypercholesterolemia   [facility] [place], Coralie Keens, [place]

## 2022-11-03 ENCOUNTER — Ambulatory Visit: Payer: Medicare HMO

## 2022-11-03 ENCOUNTER — Ambulatory Visit: Payer: Medicare HMO | Admitting: Oncology

## 2022-11-03 ENCOUNTER — Other Ambulatory Visit: Payer: Medicare HMO

## 2022-11-04 ENCOUNTER — Other Ambulatory Visit: Payer: Self-pay | Admitting: Internal Medicine

## 2022-11-04 DIAGNOSIS — I10 Essential (primary) hypertension: Secondary | ICD-10-CM

## 2022-11-04 DIAGNOSIS — R600 Localized edema: Secondary | ICD-10-CM

## 2022-11-09 NOTE — Telephone Encounter (Signed)
Pt needs a call back  she has moved and needs to get this rx filled where she lives now.

## 2022-11-10 NOTE — Telephone Encounter (Signed)
Pt was not here to pick up the rx from [facility] [place].  Please fill at [facility].  She will be finding a new provider soon.    Thanks,   -Mickel Baas

## 2022-11-10 NOTE — Telephone Encounter (Signed)
Left message advising pt.   Thanks,   -Herchel Hopkin  

## 2022-11-10 NOTE — Telephone Encounter (Signed)
I can't send controlled substance out of state

## 2022-11-10 NOTE — Addendum Note (Signed)
Addended by: Kizzie Furnish on: 11/10/2022 08:55 AM   Modules accepted: Orders

## 2022-11-11 ENCOUNTER — Encounter: Payer: Self-pay | Admitting: Oncology

## 2022-11-12 ENCOUNTER — Other Ambulatory Visit: Payer: Self-pay | Admitting: Oncology

## 2022-11-12 DIAGNOSIS — C189 Malignant neoplasm of colon, unspecified: Secondary | ICD-10-CM

## 2022-11-12 DIAGNOSIS — D702 Other drug-induced agranulocytosis: Secondary | ICD-10-CM

## 2022-11-12 NOTE — Telephone Encounter (Signed)
FYI- Dr. Tasia Catchings will be entering chemo IS request for one more tx appt. Please inform pt of appt once scheduled.

## 2022-11-13 ENCOUNTER — Other Ambulatory Visit: Payer: Self-pay | Admitting: Oncology

## 2022-11-17 ENCOUNTER — Other Ambulatory Visit: Payer: Self-pay | Admitting: Internal Medicine

## 2022-11-17 ENCOUNTER — Inpatient Hospital Stay: Payer: Medicare HMO | Admitting: Oncology

## 2022-11-17 ENCOUNTER — Inpatient Hospital Stay: Payer: Medicare HMO

## 2022-11-17 DIAGNOSIS — F411 Generalized anxiety disorder: Secondary | ICD-10-CM

## 2022-11-17 NOTE — Telephone Encounter (Signed)
Medication Refill - Medication: ALPRAZolam Duanne Moron) 0.5 MG table   Has the patient contacted their pharmacy? yes (Agent: If no, request that the patient contact the pharmacy for the refill. If patient does not wish to contact the pharmacy document the reason why and proceed with request.) (Agent: If yes, when and what did the pharmacy advise?)contact pcp  Preferred Pharmacy (with phone number or street name): [facility] [id number] [place] [address] Has the patient been seen for an appointment in the last year OR does the patient have an upcoming appointment? yes  Agent: Please be advised that RX refills may take up to 3 business days. We ask that you follow-up with your pharmacy.

## 2022-11-17 NOTE — Telephone Encounter (Signed)
Pt called back asking the prescription be sent to [facility] [address]   [place].  She is getting on a plane to come back tomorrow  CB#  [phone]

## 2022-11-18 NOTE — Telephone Encounter (Signed)
Requested medication (s) are due for refill today -no  Requested medication (s) are on the active medication list -yes  Future visit scheduled -no  Last refill: 11/02/22 #60  Notes to clinic: non delegated Rx  Requested Prescriptions  Pending Prescriptions Disp Refills   ALPRAZolam (XANAX) 0.5 MG tablet 60 tablet 0     Not Delegated - Psychiatry: Anxiolytics/Hypnotics 2 Failed - 11/17/2022 11:55 AM      Failed - This refill cannot be delegated      Failed - Urine Drug Screen completed in last 360 days      Passed - Patient is not pregnant      Passed - Valid encounter within last 6 months    Recent Outpatient Visits           1 month ago Pure hypercholesterolemia   [place], [place]   4 months ago Erosive gastritis   [facility] [place], Coralie Keens, NP   6 months ago Encounter for general adult medical examination with abnormal findings   [facility] [place], Coralie Keens, NP   9 months ago Right leg weakness   [facility] [place], Coralie Keens, NP   1 year ago Pure hypercholesterolemia   [facility] [place], Coralie Keens, NP                 Requested Prescriptions  Pending Prescriptions Disp Refills   ALPRAZolam Duanne Moron) 0.5 MG tablet 60 tablet 0     Not Delegated - Psychiatry: Anxiolytics/Hypnotics 2 Failed - 11/17/2022 11:55 AM      Failed - This refill cannot be delegated      Failed - Urine Drug Screen completed in last 360 days      Passed - Patient is not pregnant      Passed - Valid encounter within last 6 months    Recent Outpatient Visits           1 month ago Pure hypercholesterolemia   [place], Coralie Keens, NP   4 months ago Erosive gastritis   [facility] [place], Coralie Keens, NP   6 months ago Encounter for general adult medical examination with abnormal  findings   [facility] [place], Coralie Keens, NP   9 months ago Right leg weakness   [facility] [place], Coralie Keens, NP   1 year ago Pure hypercholesterolemia   [facility] [place], Coralie Keens, [place]

## 2022-11-19 NOTE — Telephone Encounter (Signed)
Pt called back for update on request, she is frustrated and wants to speak to the clinic today she says.

## 2022-11-19 NOTE — Assessment & Plan Note (Addendum)
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  No sufficient tissue for NGS Liquid biopsy showed KRAS12V, APC, TP53 mutation, TMB 4.  S/p 6 cycles of 5-FU /Bevacizumab--> CT showed mixed response--> FOLFOX/bevacizumab.-->CT imaging shows stable disease --> dc oxaliplatin, 5-FU/Bev maintenance 09/07/22 Interval CT showed stable disease.  Continue current regimen. She is [place] another [place], awaiting to be enrolled in Medicare there Labs reviewed and discussed with patient proceed with another cycle of chemotherapy today. 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  Stable disease.

## 2022-11-19 NOTE — Telephone Encounter (Signed)
Pt called back saying it is not too early for her refill.  She is living out of state and came back to [facility] for her Chemo and is asking fr it while she is here.  [phone]

## 2022-11-20 ENCOUNTER — Inpatient Hospital Stay: Payer: Medicare HMO | Attending: Oncology

## 2022-11-20 ENCOUNTER — Inpatient Hospital Stay: Payer: Medicare HMO

## 2022-11-20 ENCOUNTER — Inpatient Hospital Stay (HOSPITAL_BASED_OUTPATIENT_CLINIC_OR_DEPARTMENT_OTHER): Payer: Medicare HMO | Admitting: Oncology

## 2022-11-20 ENCOUNTER — Encounter: Payer: Self-pay | Admitting: Oncology

## 2022-11-20 VITALS — BP 115/73 | HR 72 | Temp 96.0°F | Wt 125.5 lb

## 2022-11-20 VITALS — BP 114/82 | HR 70

## 2022-11-20 DIAGNOSIS — C787 Secondary malignant neoplasm of liver and intrahepatic bile duct: Secondary | ICD-10-CM

## 2022-11-20 DIAGNOSIS — D702 Other drug-induced agranulocytosis: Secondary | ICD-10-CM

## 2022-11-20 DIAGNOSIS — Z79899 Other long term (current) drug therapy: Secondary | ICD-10-CM | POA: Insufficient documentation

## 2022-11-20 DIAGNOSIS — Z5111 Encounter for antineoplastic chemotherapy: Secondary | ICD-10-CM | POA: Insufficient documentation

## 2022-11-20 DIAGNOSIS — E876 Hypokalemia: Secondary | ICD-10-CM

## 2022-11-20 DIAGNOSIS — R634 Abnormal weight loss: Secondary | ICD-10-CM

## 2022-11-20 DIAGNOSIS — G620 Drug-induced polyneuropathy: Secondary | ICD-10-CM | POA: Diagnosis not present

## 2022-11-20 DIAGNOSIS — Z5112 Encounter for antineoplastic immunotherapy: Secondary | ICD-10-CM | POA: Diagnosis present

## 2022-11-20 DIAGNOSIS — D6481 Anemia due to antineoplastic chemotherapy: Secondary | ICD-10-CM

## 2022-11-20 DIAGNOSIS — C189 Malignant neoplasm of colon, unspecified: Secondary | ICD-10-CM

## 2022-11-20 DIAGNOSIS — T451X5A Adverse effect of antineoplastic and immunosuppressive drugs, initial encounter: Secondary | ICD-10-CM

## 2022-11-20 LAB — COMPREHENSIVE METABOLIC PANEL
ALT: 11 U/L (ref 0–44)
AST: 20 U/L (ref 15–41)
Albumin: 3.3 g/dL — ABNORMAL LOW (ref 3.5–5.0)
Alkaline Phosphatase: 74 U/L (ref 38–126)
Anion gap: 6 (ref 5–15)
BUN: 14 mg/dL (ref 8–23)
CO2: 29 mmol/L (ref 22–32)
Calcium: 9 mg/dL (ref 8.9–10.3)
Chloride: 102 mmol/L (ref 98–111)
Creatinine, Ser: 0.77 mg/dL (ref 0.44–1.00)
GFR, Estimated: >60 mL/min (ref >60)
Glucose, Bld: 92 mg/dL (ref 70–99)
Potassium: 3.6 mmol/L (ref 3.5–5.1)
Sodium: 137 mmol/L (ref 135–145)
Total Bilirubin: 0.4 mg/dL (ref 0.3–1.2)
Total Protein: 6.8 g/dL (ref 6.5–8.1)

## 2022-11-20 LAB — CBC WITH DIFFERENTIAL/PLATELET
Abs Immature Granulocytes: 0 10*3/uL (ref 0.00–0.07)
Basophils Absolute: 0 10*3/uL (ref 0.0–0.1)
Basophils Relative: 1 %
Eosinophils Absolute: 0.1 10*3/uL (ref 0.0–0.5)
Eosinophils Relative: 2 %
HCT: 33.6 % — ABNORMAL LOW (ref 36.0–46.0)
Hemoglobin: 10.6 g/dL — ABNORMAL LOW (ref 12.0–15.0)
Immature Granulocytes: 0 %
Lymphocytes Relative: 41 %
Lymphs Abs: 1.5 10*3/uL (ref 0.7–4.0)
MCH: 33.1 pg (ref 26.0–34.0)
MCHC: 31.5 g/dL (ref 30.0–36.0)
MCV: 105 fL — ABNORMAL HIGH (ref 80.0–100.0)
Monocytes Absolute: 0.6 10*3/uL (ref 0.1–1.0)
Monocytes Relative: 17 %
Neutro Abs: 1.4 10*3/uL — ABNORMAL LOW (ref 1.7–7.7)
Neutrophils Relative %: 39 %
Platelets: 189 10*3/uL (ref 150–400)
RBC: 3.2 MIL/uL — ABNORMAL LOW (ref 3.87–5.11)
RDW: 15.7 % — ABNORMAL HIGH (ref 11.5–15.5)
WBC: 3.5 10*3/uL — ABNORMAL LOW (ref 4.0–10.5)
nRBC: 0 % (ref 0.0–0.2)

## 2022-11-20 LAB — PROTEIN, URINE, RANDOM: Total Protein, Urine: 7 mg/dL

## 2022-11-20 MED ORDER — SODIUM CHLORIDE 0.9 % IV SOLN
2400.0000 mg/m2 | INTRAVENOUS | Status: DC
  Filled 2022-11-20: qty 70

## 2022-11-20 MED ORDER — PROCHLORPERAZINE MALEATE 10 MG PO TABS
10.0000 mg | ORAL_TABLET | Freq: Once | ORAL | Status: AC
  Administered 2022-11-20: 10 mg via ORAL
  Filled 2022-11-20: qty 1

## 2022-11-20 MED ORDER — SODIUM CHLORIDE 0.9 % IV SOLN
2400.0000 mg/m2 | INTRAVENOUS | Status: DC
  Administered 2022-11-20: 3500 mg via INTRAVENOUS
  Filled 2022-11-20: qty 70

## 2022-11-20 MED ORDER — LEUCOVORIN CALCIUM INJECTION 350 MG
400.0000 mg/m2 | Freq: Once | INTRAVENOUS | Status: AC
  Administered 2022-11-20: 604 mg via INTRAVENOUS
  Filled 2022-11-20: qty 30.2

## 2022-11-20 MED ORDER — SODIUM CHLORIDE 0.9 % IV SOLN: 5.0000 mg/kg | Freq: Once | INTRAVENOUS | Status: DC

## 2022-11-20 MED ORDER — SODIUM CHLORIDE 0.9 % IV SOLN
Freq: Once | INTRAVENOUS | Status: AC
  Filled 2022-11-20: qty 250

## 2022-11-20 MED ORDER — SODIUM CHLORIDE 0.9 % IV SOLN
5.0000 mg/kg | Freq: Once | INTRAVENOUS | Status: AC
  Administered 2022-11-20: 300 mg via INTRAVENOUS
  Filled 2022-11-20: qty 12

## 2022-11-20 NOTE — Patient Instructions (Signed)
[facility]  Discharge Instructions: Thank you for choosing [facility] to provide your oncology and hematology care.  If you have a lab appointment with the [facility], please go directly to the [facility] and check in at the registration area.  Wear comfortable clothing and clothing appropriate for easy access to any Portacath or PICC line.   We strive to give you quality time with your provider. You may need to reschedule your appointment if you arrive late (15 or more minutes).  Arriving late affects you and other patients whose appointments are after yours.  Also, if you miss three or more appointments without notifying the office, you may be dismissed from the clinic at the provider's discretion.      For prescription refill requests, have your pharmacy contact our office and allow 72 hours for refills to be completed.    Today you received the following chemotherapy and/or immunotherapy agents Zirabev, Leucovorin and Adrucil      To help prevent nausea and vomiting after your treatment, we encourage you to take your nausea medication as directed.  BELOW ARE SYMPTOMS THAT SHOULD BE REPORTED IMMEDIATELY: *FEVER GREATER THAN 100.4 F (38 C) OR HIGHER *CHILLS OR SWEATING *NAUSEA AND VOMITING THAT IS NOT CONTROLLED WITH YOUR NAUSEA MEDICATION *UNUSUAL SHORTNESS OF BREATH *UNUSUAL BRUISING OR BLEEDING *URINARY PROBLEMS (pain or burning when urinating, or frequent urination) *BOWEL PROBLEMS (unusual diarrhea, constipation, pain near the anus) TENDERNESS IN MOUTH AND THROAT WITH OR WITHOUT PRESENCE OF ULCERS (sore throat, sores in mouth, or a toothache) UNUSUAL RASH, SWELLING OR PAIN  UNUSUAL VAGINAL DISCHARGE OR ITCHING   Items with * indicate a potential emergency and should be followed up as soon as possible or go to the Emergency Department if any problems should occur.  Please show the CHEMOTHERAPY ALERT CARD or IMMUNOTHERAPY  ALERT CARD at check-in to the Emergency Department and triage nurse.  Should you have questions after your visit or need to cancel or reschedule your appointment, please contact [facility]  [phone] and follow the prompts.  Office hours are 8:00 a.m. to 4:30 p.m. Monday - Friday. Please note that voicemails left after 4:00 p.m. may not be returned until the following business day.  We are closed weekends and major holidays. You have access to a nurse at all times for urgent questions. Please call the main number to the clinic [phone] and follow the prompts.  For any non-urgent questions, you may also contact your provider using MyChart. We now offer e-Visits for anyone 64 and older to request care online for non-urgent symptoms. For details visit [email or web address].   Also download the MyChart app! Go to the app store, search "MyChart", open the app, select [facility], and log in with your MyChart username and password.

## 2022-11-20 NOTE — Progress Notes (Signed)
Hematology/Oncology Progress note [phone] [phone]         Patient Care Team: Jearld Fenton, NP as PCP - General (Internal Medicine) Clent Jacks, RN as Oncology Nurse Navigator [name], MD as Consulting Physician (Oncology)  ASSESSMENT & PLAN:   Cancer Staging  Metastatic colon cancer to liver [facility]) Staging form: Colon and Rectum, AJCC 8th Edition - Clinical stage from 09/04/2021: Stage Unknown (cTX, cNX, cM1) - Signed by [name], MD on 05/13/2022   Metastatic colon cancer to liver [facility]) #Metastatic colon cancer to liver, 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  No sufficient tissue for NGS Liquid biopsy showed KRAS12V, APC, TP53 mutation, TMB 4.  S/p 6 cycles of 5-FU /Bevacizumab--> CT showed mixed response--> FOLFOX/bevacizumab.-->CT imaging shows stable disease --> dc oxaliplatin, 5-FU/Bev maintenance 09/07/22 Interval CT showed stable disease.  Continue current regimen. She is moving to another state, awaiting to be enrolled in Medicare there Labs reviewed and discussed with patient proceed with another cycle of chemotherapy today. 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  Stable disease.  Anemia due to antineoplastic chemotherapy Hemoglobin is stable.  Continue monitor.    Chemotherapy-induced neuropathy (HCC) Continue gabapentin 300 mg 3 times daily.  Encounter for antineoplastic chemotherapy Chemotherapy plan as listed above  Hypokalemia  recommend patient to take potassium chloride 25mq BID.   Weight loss We discussed about appetite stimulant Marinol.   Patient would like to defer for now and try nutrition supplements breast.  Continue follow-up with nutritionist  Follow-up patient is moving to another state. This is her last treatment with our cancer center. No additional appointment is scheduled.  All questions were answered.  The patient knows to call the clinic with any problems, questions or concerns.  [name], PhD [facility] H[facility]ology Oncology 11/20/2022   CHIEF COMPLAINTS/REASON FOR VISIT:  metastatic colon cancer  HISTORY OF PRESENTING ILLNESS:   Chelsea FUCHSis a  77y.o.  female presents for management of metastatic colon cancer. Oncology History  Metastatic colon cancer to liver ([facility]  08/21/2021 Imaging   CT abdomen pelvis with contrast  showed soft tissue mass at the base of cecum measuring up to 5 cm, slightly increased in size.  Interval development of multiple low-density lesions throughout the liver highly suggestive for metastatic disease.  Complex mass in the region of the vaginal cuff measuring up to 5.2 cm, increased in size.  Right ovary also appears more prominent on the current examination.  Moderate large volume of stool throughout the colon.  Mild urinary bladder wall thickening.  Colonic diverticulosis without evidence of active diverticulitis.  Aortic atherosclerosis   09/04/2021 Procedure   patient is status post left lobe liver lesion biopsy and pathology showed metastatic adenocarcinoma, compatible with colorectal primary.   09/04/2021 Cancer Staging   Staging form: Colon and Rectum, AJCC 8th Edition - Clinical stage from 09/04/2021: Stage Unknown (cTX, cNX, cM1) - Signed by YEarlie Server MD on 05/13/2022 Stage prefix: Initial diagnosis   09/16/2021 Initial Diagnosis   Metastatic colon cancer to liver ([facility] one liquid biopsy testing showed KRASG12V, APC TP53 TMB 4  Patient initially went to emergency room on 03/24/2021 for abdominal pain. 03/24/2021, CT scan of the abdomen showed marked bladder wall thickening with surrounding soft tissue stranding is noted.  Concerning for cystitis.  There is a lobulated mass between the posterior wall of the bladder and  the rectum which appears to arise from the vaginal cuff.  This is indeterminate and difficult to characterize  reflecting lack of IV contrast material.  Nonobstructing left renal calculus, left sacroiliitis, lumbar spondylosis aortic atherosclerosis. 03/24/2021 CT pelvic showed abdominal [place] soft tissue of right cecum, concerning for colon carcinoma.  Colonoscopy recommended.  Low-attenuation mass in the region of vaginal cuff is again noted.  Diffuse urinary bladder wall thickening and mild bilateral hydroureter possibly cystitis.   [place] 03/24/2021 Lobulated solid 3.8 cm mass confirmed by ultrasound. This is most compatible with a neoplasm but remains indeterminate as to the origin as the epicenter seems to be outside of both the vaginal cuff and the adjacent colon, while the lesion appears inseparable from both. Extensive echogenic debris within the distended urinary bladder.   03/26/2021, patient was seen by Dr. Theora Gianotti for evaluation of colonic/vaginal cuff pelvic mass.  Patient also was referred to gastroenterology for colonoscopy.   03/26/2021 CEA 3.4.  CA125 9.7 04/04/2021, status post colonoscopy which showed a frond like /villous nonobstructing large mass was found in the cecum, this was biopsied.  Terminal ileum was briefly intubated and appeared normal.  Diverticulosis in the sigmoid colon.  The rectum, sigmoid colon, descending colon, transverse colon and ascending colon were normal.  Nonbleeding internal hemorrhoids. 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 Given that the biopsy may not be representative of the entire underlying lesion.  Patient was recommended to establish with surgeon for evaluation. Patient no showed for her surgery appointment and as well as her follow-up appointment with gastroenterology.  Per daughter, patient was having cervical spine surgery and she prioritized that over the colon surgery.   10/20/2021 - 01/07/2022 Chemotherapy    COLORECTAL 5-FU  + Bevacizumab q14d      10/20/2021 -  Chemotherapy   Patient is on Treatment Plan : COLORECTAL 5FU +  Leucovorin (Modified [place]ramont) + Bevacizumab q14d     12/22/2021 Imaging   CT chest abdomen pelvis Decreased cecum mass, however increased size and size of metastatic lesions throughout the liver.  Interval enlargement of vaginal cuff mass. No mets in chest.    01/21/2022 -  Chemotherapy   COLORECTAL FOLFOX  + Bevacizumab q14d    patient was seen by gynecology oncology Dr. Fransisca Connors.  Biopsy of the vaginal mass was not recommended.Patient's case was also discussed on multidisciplinary tumor board.  IR potentially can try a biopsy if patient is willing to.I had a discussion with patient's daughter over the phone prior to this visit.  We discussed about option of adding oxaliplatin to 5-FU/bevacizumab and continue treatment of colon cancer, repeat CT scan short-term and if progression, repeat biopsy versus proceeding with vaginal mass biopsy.  Daughter prefers proceeding with chemotherapy and defer biopsy.   01/08/2022 patient has had a second opinion at [facility] and was seen by Dr. Reynaldo Minium .  Dr. Reynaldo Minium agrees with the current treatment plan with FOLFOX/bevacizumab.  He has ordered guardant 360 to see if she may be eligible for clinical trial in the future.  -   04/13/2022 Imaging    CT chest abdomen pelvis showed a cecal mass with stable hepatic metastasis.  Mixed cystic and solid mass in the region of the vaginal cuff, new area of patchy groundglass and consolidation in the right upper lobe, lingula and left lower lobe.  Likely due to pneumonia.  Hepatic steatosis.  Bilateral renal stones.  Aortic atherosclerosis   06/24/2022 Imaging   CT abdomen pelvis with contrast showed There  are low-density lesions in liver consistent with metastatic disease with no significant change. Wall thickening in the cecum appears less prominent. There is 5.5 x 4.2 cm mixed density lesion in vaginal cuff suggesting possible metastatic disease.   There is no evidence of intestinal obstruction or pneumoperitoneum.There  is no hydronephrosis.   Small patchy infiltrates are seen in the lower lung fields suggesting atelectasis/pneumonia.   Small bilateral renal stones. Diverticulosis of colon without signs of diverticulitis. 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 There is encroachment of neural foramina at L4-L5 and L5-S1 levels.     06/25/2022 Imaging   CT soft tissue neck with contrast showed1No acute process in the neck to explain the patient's difficulty swallowing.  CT chest contrast showed 1. Patchy areas of airspace opacity in the right upper lobe and left lower lobe and lingula slightly progressed since the prior radiograph and may represent worsening or recurrent pneumonia. Clinical correlation and follow-up to resolution recommended. 2. Aortic Atherosclerosis    10/14/2022 Imaging   CT chest abdomen pelvis with contrast showed Stable small hypovascular liver metastases.  Stable mass in pelvic cul-de-sac involving the vaginal cuff,consistent with metastatic disease.  No new or progressive metastatic disease within the chest, abdomen,or pelvis. Stable bilateral bilateral airspace disease, suspicious for pneumonia or inflammatory etiology.    06/24/2022 - 06/26/2022 patient was hospitalized due to difficulty swallowing, 1 day of abdominal pain.  Patient had extensive work-up including EGD which showed erosive gastritis.  No significant esophageal or gastric abnormality.  CT scan showed possible aspiration induced pneumonitis.  Antibiotics was held.  Today patient denies any fever, chills,.  Occasionally she has cough.   INTERVAL HISTORY CHARLETTA MASLAK is a 77 y.o. female who has above history reviewed by me today presents for follow up visit for management of metastatic colon cancer Patient was accompanied by son-in-law + fatigue stable.  + appetite is fair.   Patient is in the process of moving to another state.  Patient request to have another chemotherapy done at our  cancer center to bridge to her establishing with future oncologist in [place]. She has lost 23 pounds since last visit.  She attributes to the stress due to the move. No nausea vomiting.  She has intermittent diarrhea for which she takes Imodium with improvement. Right side pre-existing neuropathy . On gabapentin.. No other new complaints.   Review of Systems  Constitutional:  Positive for fatigue and unexpected weight change. Negative for appetite change, chills and fever.  HENT:   Negative for hearing loss and voice change.   Eyes:  Negative for eye problems.  Respiratory:  Negative for chest tightness and cough.   Cardiovascular:  Negative for chest pain.  Gastrointestinal:  Negative for abdominal distention, abdominal pain, blood in stool and diarrhea.  Endocrine: Negative for hot flashes.  Genitourinary:  Negative for difficulty urinating and frequency.   Musculoskeletal:  Positive for back pain. Negative for arthralgias.  Skin:  Negative for itching and rash.  Neurological:  Positive for numbness (chornic finger tips). Negative for extremity weakness.  Hematological:  Negative for adenopathy.  Psychiatric/Behavioral:  Negative for confusion.     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)    Moderate mitral insufficiency    Port-A-Cath in place  Seizures ([place])    Spinal stenosis    Ulcer    Urinary incontinence     SURGICAL HISTORY: Past Surgical History:  Procedure Laterality Date   ABDOMINAL HYSTERECTOMY     BACK SURGERY     due to polio   BREAST BIOPSY Bilateral    neg   BREAST BIOPSY Right 2011   neg/stereo   CARPAL TUNNEL RELEASE     CATARACT EXTRACTION Right 2020   COLONOSCOPY WITH PROPOFOL N/A 04/04/2021   Procedure: COLONOSCOPY WITH PROPOFOL;  Surgeon: Virgel Manifold, MD;  Location: [facility] ENDOSCOPY;   Service: Endoscopy;  Laterality: N/A;   ESOPHAGOGASTRODUODENOSCOPY (EGD) WITH PROPOFOL N/A 06/25/2022   Procedure: ESOPHAGOGASTRODUODENOSCOPY (EGD) WITH PROPOFOL;  Surgeon: Lin Landsman, MD;  Location: [facility] ENDOSCOPY;  Service: Gastroenterology;  Laterality: N/A;   EYE SURGERY     IR IMAGING GUIDED PORT INSERTION  10/09/2021    SOCIAL HISTORY: Social History   Socioeconomic History   Marital status: Divorced    Spouse name: Not on file   Number of children: Not on file   Years of education: Not on file   Highest education level: Not on file  Occupational History   Not on file  Tobacco Use   Smoking status: Never   Smokeless tobacco: Never  Vaping Use   Vaping Use: Never used  Substance and Sexual Activity   Alcohol use: Never   Drug use: No   Sexual activity: Not Currently  Other Topics Concern   Not on file  Social History Narrative   Not on file   Social Determinants of Health   Financial Resource Strain: Low Risk  (01/12/2022)   Overall Financial Resource Strain (CARDIA)    Difficulty of Paying Living Expenses: Not very hard  Food Insecurity: No Food Insecurity (06/24/2022)   Hunger Vital Sign    Worried About Running Out of Food in the Last Year: Never true    Ran Out of Food in the Last Year: Never true  Transportation Needs: No Transportation Needs (06/24/2022)   PRAPARE - Hydrologist (Medical): No    Lack of Transportation (Non-Medical): No  Physical Activity: Inactive (01/12/2022)   Exercise Vital Sign    Days of Exercise per Week: 0 days    Minutes of Exercise per Session: 0 min  Stress: Stress Concern Present (01/12/2022)   [place]    Feeling of Stress : To some extent  Social Connections: Moderately Integrated (01/12/2022)   Social Connection and Isolation Panel [NHANES]    Frequency of Communication with Friends and Family: Three times a week    Frequency  of Social Gatherings with Friends and Family: Three times a week    Attends Religious Services: 1 to 4 times per year    Active Member of Clubs or Organizations: No    Attends Archivist Meetings: 1 to 4 times per year    Marital Status: Widowed  Intimate Partner Violence: Not At Risk (06/24/2022)   Humiliation, Afraid, Rape, and Kick questionnaire    Fear of Current or Ex-Partner: No    Emotionally Abused: No    Physically Abused: No    Sexually Abused: No    FAMILY HISTORY: Family History  Problem Relation Age of Onset   Arthritis Mother    Heart disease Mother    Stroke Mother    Hypertension Mother    COPD Mother    Sudden death  Sister    Other Father        unknown medical history   Breast cancer Neg Hx     ALLERGIES:  is allergic to penicillins.  MEDICATIONS:  Current Outpatient Medications  Medication Sig Dispense Refill   acetaminophen (TYLENOL) 500 MG tablet Take 500 mg by mouth every 6 (six) hours as needed.     ALPRAZolam (XANAX) 0.5 MG tablet TAKE 1 TABLET BY MOUTH TWICE A DAY AS NEEDED FOR ANXIETY 10.11.23 60 tablet 0   aspirin EC 81 MG tablet Take 1 tablet (81 mg total) by mouth daily. Swallow whole. 30 tablet 12   atorvastatin (LIPITOR) 10 MG tablet TAKE 1 TABLET BY MOUTH EVERY DAY 90 tablet 0   busPIRone (BUSPAR) 5 MG tablet TAKE 1 TABLET BY MOUTH THREE TIMES A DAY 270 tablet 0   Cyanocobalamin (VITAMIN B 12 PO) Take 500 mcg by mouth daily.     diclofenac Sodium (VOLTAREN) 1 % GEL Apply 4 g topically 4 (four) times daily. 4 g 0   docusate sodium (COLACE) 100 MG capsule Take 1 capsule (100 mg total) by mouth 2 (two) times daily. 60 capsule 0   FLUoxetine (PROZAC) 20 MG capsule TAKE 3 CAPSULES (60 MG TOTAL) BY MOUTH EVERY MORNING. 270 capsule 0   gabapentin (NEURONTIN) 300 MG capsule Take 1 capsule (300 mg total) by mouth 3 (three) times daily. 90 capsule 3   lidocaine-prilocaine (EMLA) cream Apply small amount of cream to port site 1-2 hour prior to  chemo treatment. 30 g 6   loperamide (IMODIUM) 2 MG capsule TAKE 1 CAP BY MOUTH SEE ADMIN INSTRUCTIONS. INITIAL: 4 MG, FOLLOWED BY 2 MG AFTER EACH LOOSE STOOL MAXIMUM: 16 MG/DAY 60 capsule 0   loratadine (CLARITIN) 10 MG tablet Take 1 tablet (10 mg total) by mouth See admin instructions. Take 1 tablet daily for 4 days after each chemotherapy treatments. 90 tablet 0   meclizine (ANTIVERT) 12.5 MG tablet Take 1 tablet (12.5 mg total) by mouth 3 (three) times daily as needed for dizziness. 30 tablet 0   methocarbamol (ROBAXIN) 500 MG tablet TAKE 1 TABLET TWICE DAILY AS NEEDED FOR MUSCLE SPASM(S) 180 tablet 0   ondansetron (ZOFRAN) 8 MG tablet Take 1 tablet (8 mg total) by mouth 2 (two) times daily as needed (Nausea or vomiting). 30 tablet 1   polyethylene glycol powder (GLYCOLAX/MIRALAX) 17 GM/SCOOP powder Take 17 g by mouth daily. 3350 g 1   potassium chloride SA (KLOR-CON M) 20 MEQ tablet TAKE 1 TABLET EVERY DAY 90 tablet 3   triamterene-hydrochlorothiazide (MAXZIDE-25) 37.5-25 MG tablet TAKE 1 TABLET EVERY DAY 90 tablet 3   No current facility-administered medications for this visit.   Facility-Administered Medications Ordered in Other Visits  Medication Dose Route Frequency Provider Last Rate Last Admin   bevacizumab-bvzr (ZIRABEV) 300 mg in sodium chloride 0.9 % 100 mL chemo infusion  5 mg/kg (Order-Specific) Intravenous Once [name], MD       Derrill Memo ON 11/21/2022] fluorouracil (ADRUCIL) 3,500 mg in sodium chloride 0.9 % 80 mL chemo infusion  2,400 mg/m2 (Order-Specific) Intravenous 1 day or 1 dose [name], MD       heparin lock flush 100 UNIT/ML injection            palonosetron (ALOXI) 0.25 MG/5ML injection            prochlorperazine (COMPAZINE) 10 MG tablet              PHYSICAL EXAMINATION: ECOG PERFORMANCE  STATUS: 2 - Symptomatic, <50% confined to bed Vitals:   11/20/22 0842  BP: 115/73  Pulse: 72  Temp: (!) 96 F (35.6 C)  SpO2: 100%    Filed Weights   11/20/22 0842   Weight: 125 lb 8 oz (56.9 kg)       Physical Exam Constitutional:      General: She is not in acute distress.    Comments: Patient sits in wheelchair  HENT:     Head: Normocephalic and atraumatic.  Eyes:     General: No scleral icterus. Cardiovascular:     Rate and Rhythm: Normal rate and regular rhythm.     Heart sounds: Normal heart sounds.  Pulmonary:     Effort: Pulmonary effort is normal. No respiratory distress.     Breath sounds: No wheezing.  Abdominal:     General: Bowel sounds are normal. There is no distension.     Palpations: Abdomen is soft.  Musculoskeletal:        General: No deformity. Normal range of motion.     Cervical back: Normal range of motion and neck supple.  Skin:    General: Skin is warm and dry.     Findings: No erythema or rash.  Neurological:     Mental Status: She is alert and oriented to person, place, and time. Mental status is at baseline.     Cranial Nerves: No cranial nerve deficit.     Coordination: Coordination normal.  Psychiatric:        Mood and Affect: Mood normal.     LABORATORY DATA:  I have reviewed the data as listed     Latest Ref Rng & Units 11/20/2022    8:28 AM 10/26/2022    9:25 AM 10/20/2022   12:57 PM  CBC  WBC 4.0 - 10.5 K/uL 3.5  6.3  2.5   Hemoglobin 12.0 - 15.0 g/dL 10.6  10.4  9.7   Hematocrit 36.0 - 46.0 % 33.6  31.8  31.1   Platelets 150 - 400 K/uL 189  146  123       Latest Ref Rng & Units 11/20/2022    8:28 AM 10/26/2022    9:25 AM 10/20/2022   12:57 PM  CMP  Glucose 70 - 99 mg/dL 92  95  99   BUN 8 - 23 mg/dL 14  12  14   $ Creatinine 0.44 - 1.00 mg/dL 0.77  0.79  0.80   Sodium 135 - 145 mmol/L 137  138  139   Potassium 3.5 - 5.1 mmol/L 3.6  3.2  3.5   Chloride 98 - 111 mmol/L 102  98  101   CO2 22 - 32 mmol/L 29  28  30   $ Calcium 8.9 - 10.3 mg/dL 9.0  9.1  8.8   Total Protein 6.5 - 8.1 g/dL 6.8  7.6  6.8   Total Bilirubin 0.3 - 1.2 mg/dL 0.4  0.2  0.4   Alkaline Phos 38 - 126 U/L 74  82  55    AST 15 - 41 U/L 20  19  18   $ ALT 0 - 44 U/L 11  13  11      $ RADIOGRAPHIC STUDIES: I have personally reviewed the radiological images as listed and agreed with the findings in the report. CT CHEST ABDOMEN PELVIS W CONTRAST  Result Date: 10/14/2022 CLINICAL DATA:  Follow-up metastatic colon carcinoma. Currently undergoing chemotherapy. Restaging. * Tracking Code: BO * EXAM: CT CHEST, ABDOMEN, AND PELVIS  WITH CONTRAST TECHNIQUE: Multidetector CT imaging of the chest, abdomen and pelvis was performed following the standard protocol during bolus administration of intravenous contrast. RADIATION DOSE REDUCTION: This exam was performed according to the departmental dose-optimization program which includes automated exposure control, adjustment of the mA and/or kV according to patient size and/or use of iterative reconstruction technique. CONTRAST:  178m OMNIPAQUE IOHEXOL 300 MG/ML  SOLN COMPARISON:  Chest CT on 06/25/2022, and AP CT on 06/24/2022 FINDINGS: CT CHEST FINDINGS Cardiovascular: No acute findings. Mediastinum/Lymph Nodes: No masses or pathologically enlarged lymph nodes identified. Lungs/Pleura: Airspace disease is again seen in the medial right upper lobe, lingula, and posterior left lower lobe, without significant change. No suspicious pulmonary nodules or masses are identified. No evidence of pleural effusion. Musculoskeletal:  No suspicious bone lesions identified. CT ABDOMEN AND PELVIS FINDINGS Hepatobiliary: Multiple small hypovascular masses are again seen in the left and right hepatic lobes, largest measuring approximately 2 cm in segment 2. These show no significant change. Gallbladder is unremarkable. No evidence of biliary ductal dilatation. Pancreas:  No mass or inflammatory changes. Spleen:  Within normal limits in size and appearance. Adrenals/Urinary tract: No suspicious masses or hydronephrosis. Stable mild bilateral renal parenchymal scarring. Stable small left posterior bladder  diverticulum. Stomach/Bowel: No evidence of obstruction, inflammatory process, or abnormal fluid collections. Vascular/Lymphatic: No pathologically enlarged lymph nodes identified. No acute vascular findings. Reproductive: No uterus is seen. A low-attenuation mass is again seen in the pelvic cul-de-sac which involves the vaginal cuff. This measures 5.1 x 3.6 cm on image 82/2, and shows no significant change compared to prior exam. No evidence of ascites. Other:  None. Musculoskeletal: No suspicious bone lesions identified. 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 IMPRESSION: Stable small hypovascular liver metastases. Stable mass in pelvic cul-de-sac involving the vaginal cuff, consistent with metastatic disease. No new or progressive metastatic disease within the chest, abdomen, or pelvis. Stable bilateral bilateral airspace disease, suspicious for pneumonia or inflammatory etiology. Electronically Signed   By: JMarlaine HindM.D.   On: 10/14/2022 08:55

## 2022-11-20 NOTE — Assessment & Plan Note (Signed)
Continue gabapentin 300 mg 3 times daily.

## 2022-11-20 NOTE — Assessment & Plan Note (Signed)
Chemotherapy plan as listed above 

## 2022-11-20 NOTE — Assessment & Plan Note (Signed)
Hemoglobin is stable.  Continue monitor.

## 2022-11-20 NOTE — Assessment & Plan Note (Signed)
We discussed about appetite stimulant Marinol.   Patient would like to defer for now and try nutrition supplements breast.  Continue follow-up with nutritionist

## 2022-11-20 NOTE — Progress Notes (Signed)
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  Patient receiving folfox/bevacizumab.    Met with patient during infusion.  Reports that she has moved to [place] and this will be last treatment at the cancer center.  Says that she has not eaten as much due to stress of move.  She is living in independent living housing.  Family is nearby.  She is going to be getting a sitter/caregiver to help her during the day.     Medications: reviewed  Labs: reviewed  Anthropometrics:   Weight 125 lb 8 oz  143 lb 6.4 oz on 1/2 146 lb on 9/27   NUTRITION DIAGNOSIS: Inadequate oral intake continues    INTERVENTION:  Reviewed ways to add calories and protein in diet.  Encouraged food high in protein at every meal/snack    NEXT VISIT: no follow-up  Chelsea [name] B. Zenia Resides, [place], [place] Registered Dietitian [phone]

## 2022-11-20 NOTE — Progress Notes (Signed)
Approx. 1055: pt began to cough while drinking some water. 1058: Pt having trouble speaking and O2 sats drop to 88% and pt reports that it feels "like something is stuck right here" and points to throat. Nelwyn Salisbury PA aware 1059: Nelwyn Salisbury PA at chairside and performs assessment. 1100: Pt able to speak but still having some difficulty and reports that it feels like pressure on her chest, pt denies the feeling of the something being stuck in her throat. O2 sats improving to 95%  1105: pt able to speak clearly with mild coughing noted. O2 sats 100% Per Judson Roch PA continue to monitor no new orders needed at this time.  1108: pt up to BR and able to "burp" pt reports "that helped a lot"  1145: Pt back to chair, pt denies any symptoms at this time and back to baseline. 1200: pt unable to give a urine sample despite multiple attempts and oral hydration. Dr. Tasia Catchings aware. Per Dr. Tasia Catchings continue to wait for urine sample/results prior to [facility]. Pt aware.   1645: Pt stable at discharge.

## 2022-11-20 NOTE — Assessment & Plan Note (Addendum)
recommend patient to take potassium chloride 66mq BID.

## 2022-11-21 LAB — CEA: CEA: 2.7 ng/mL (ref 0.0–4.7)

## 2022-11-23 ENCOUNTER — Inpatient Hospital Stay: Payer: Medicare HMO

## 2022-11-23 VITALS — BP 106/66 | HR 76 | Temp 96.3°F | Resp 16

## 2022-11-23 DIAGNOSIS — D702 Other drug-induced agranulocytosis: Secondary | ICD-10-CM

## 2022-11-23 DIAGNOSIS — Z5112 Encounter for antineoplastic immunotherapy: Secondary | ICD-10-CM | POA: Diagnosis not present

## 2022-11-23 DIAGNOSIS — C189 Malignant neoplasm of colon, unspecified: Secondary | ICD-10-CM

## 2022-11-23 MED ORDER — HEPARIN SOD (PORK) LOCK FLUSH 100 UNIT/ML IV SOLN
500.0000 [IU] | Freq: Once | INTRAVENOUS | Status: AC | PRN
  Administered 2022-11-23: 500 [IU]
  Filled 2022-11-23: qty 5

## 2022-11-23 MED ORDER — SODIUM CHLORIDE 0.9% FLUSH
10.0000 mL | INTRAVENOUS | Status: DC | PRN
  Administered 2022-11-23: 10 mL
  Filled 2022-11-23: qty 10

## 2022-11-26 ENCOUNTER — Encounter: Payer: Self-pay | Admitting: Oncology

## 2022-11-26 NOTE — Telephone Encounter (Signed)
Ok to send magic mouthwash ?

## 2022-11-27 ENCOUNTER — Other Ambulatory Visit: Payer: Self-pay

## 2022-11-27 ENCOUNTER — Inpatient Hospital Stay (HOSPITAL_BASED_OUTPATIENT_CLINIC_OR_DEPARTMENT_OTHER): Payer: Medicare HMO | Admitting: Hospice and Palliative Medicine

## 2022-11-27 DIAGNOSIS — K121 Other forms of stomatitis: Secondary | ICD-10-CM | POA: Diagnosis not present

## 2022-11-27 MED ORDER — MAGIC MOUTHWASH W/LIDOCAINE: 5.0000 mL | Freq: Four times a day (QID) | ORAL | 1 refills | Status: DC | PRN

## 2022-11-27 NOTE — Progress Notes (Signed)
Virtual Visit via Telephone Note  I connected with Chelsea [name] on 11/27/22 at  3:00 PM EST by a video enabled telemedicine application and verified that I am speaking with the correct person using two identifiers.  Location: Patient: Home Provider: Clinic   I discussed the limitations of evaluation and management by telemedicine and the availability of in person appointments. The patient expressed understanding and agreed to proceed.  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  Patient is on 5-FU plus Bev with most recent CT January 2024 showing stable liver metastases and pelvic mass without new or progressive metastatic disease.   Observations/Objective: Patient most recently saw Dr. Tasia Catchings on 11/20/2022 at which time patient received cycle 22 5-FU plus Bev.  Patient was planning to move to another state with plan to establish care with a different oncologist  Spoke today with patient's daughter.  I was supposed to have a virtual visit but due to technological issues patient/family were unable to connect.  Daughter reports that patient has had oral ulcers causing pain with eating and drinking.  She denies white patches on tongue.  Daughter feels like patient's face is intermittently puffy but she denies patient having any difficulty breathing or rashes.  Assessment and Plan: Symptoms sound like stomatitis.  Dr. Tasia Catchings has sent prescription for Magic mouthwash.  Also suggested trial of baking soda/salt rinses.  Recommended that if facial swelling or difficulty breathing, patient should be evaluated emergency department.  Follow Up Instructions: As needed  I discussed the assessment and treatment plan with the patient. The patient was provided an opportunity to ask questions and all were answered. The patient agreed with the plan and demonstrated an understanding of the instructions.    The patient was advised to call back or seek an in-person evaluation if the symptoms worsen or if the condition fails to improve as anticipated.  I provided 5 minutes of non-face-to-face time during this encounter.   Irean Hong, NP

## 2022-12-04 ENCOUNTER — Encounter: Payer: Self-pay | Admitting: Oncology

## 2022-12-14 ENCOUNTER — Telehealth: Payer: Self-pay | Admitting: Internal Medicine

## 2022-12-14 NOTE — Telephone Encounter (Signed)
Contacted Chelsea [name] to schedule their annual wellness visit. Patient declined to schedule AWV at this time.Spoke with patient she stated she moved to [facility]; [place]: [phone]

## 2022-12-14 NOTE — Telephone Encounter (Signed)
Called patient to schedule Medicare Annual Wellness Visit (AWV). Left message for patient to call back and schedule Medicare Annual Wellness Visit (AWV).  Last date of AWV: NONE  Please schedule an appointment at any time with Lorrie Barnes, NHA  .  If any questions, please contact me.  Thank you ,  Kathy Harris-Coley; Care Guide Ambulatory Clinical Support [facility] Direct Dial: [phone]   

## 2022-12-20 ENCOUNTER — Encounter: Payer: Self-pay | Admitting: Oncology

## 2023-02-26 ENCOUNTER — Other Ambulatory Visit: Payer: Self-pay | Admitting: Internal Medicine

## 2023-03-02 ENCOUNTER — Encounter: Payer: Self-pay | Admitting: Oncology

## 2023-08-28 ENCOUNTER — Other Ambulatory Visit: Payer: Self-pay | Admitting: Internal Medicine

## 2023-08-30 ENCOUNTER — Encounter: Payer: Self-pay | Admitting: Oncology

## 2023-12-07 ENCOUNTER — Other Ambulatory Visit: Payer: Self-pay

## 2023-12-07 DIAGNOSIS — C787 Secondary malignant neoplasm of liver and intrahepatic bile duct: Secondary | ICD-10-CM

## 2023-12-08 ENCOUNTER — Encounter: Payer: Self-pay | Admitting: Oncology

## 2023-12-20 ENCOUNTER — Encounter: Payer: Self-pay | Admitting: Oncology

## 2023-12-22 ENCOUNTER — Encounter: Payer: Self-pay | Admitting: Oncology

## 2023-12-27 ENCOUNTER — Ambulatory Visit
Admission: RE | Admit: 2023-12-27 | Discharge: 2023-12-27 | Disposition: A | Discharge status: Home / Self Care | Payer: Medicare (Managed Care) | Source: Ambulatory Visit

## 2023-12-27 DIAGNOSIS — C189 Malignant neoplasm of colon, unspecified: Secondary | ICD-10-CM

## 2023-12-27 MED ORDER — GADOPICLENOL 0.5 MMOL/ML IV SOLN
6.0000 mL | Freq: Once | INTRAVENOUS | Status: AC | PRN
  Administered 2023-12-27: 6 mL via INTRAVENOUS

## 2024-01-04 ENCOUNTER — Encounter: Payer: Self-pay | Admitting: Internal Medicine

## 2024-01-04 ENCOUNTER — Ambulatory Visit: Payer: Medicare (Managed Care) | Admitting: Internal Medicine

## 2024-01-04 ENCOUNTER — Encounter: Payer: Self-pay | Admitting: Oncology

## 2024-01-04 VITALS — BP 138/80 | HR 65 | Ht <= 58 in | Wt 134.0 lb

## 2024-01-04 DIAGNOSIS — I10 Essential (primary) hypertension: Secondary | ICD-10-CM

## 2024-01-04 DIAGNOSIS — E7800 Pure hypercholesterolemia, unspecified: Secondary | ICD-10-CM

## 2024-01-04 DIAGNOSIS — F411 Generalized anxiety disorder: Secondary | ICD-10-CM

## 2024-01-04 DIAGNOSIS — C787 Secondary malignant neoplasm of liver and intrahepatic bile duct: Secondary | ICD-10-CM

## 2024-01-04 DIAGNOSIS — C189 Malignant neoplasm of colon, unspecified: Secondary | ICD-10-CM

## 2024-01-04 DIAGNOSIS — G629 Polyneuropathy, unspecified: Secondary | ICD-10-CM | POA: Diagnosis not present

## 2024-01-04 DIAGNOSIS — H9190 Unspecified hearing loss, unspecified ear: Secondary | ICD-10-CM | POA: Diagnosis not present

## 2024-01-04 MED ORDER — ALPRAZOLAM 0.5 MG PO TABS: ORAL_TABLET | ORAL | 0 refills | Status: DC

## 2024-01-04 NOTE — Progress Notes (Signed)
 New patient Office Visit     CC/Reason for Visit: Establish care, discuss chronic concerns  HPI: Chelsea [name] is a 78 y.o. female who is coming in today for the above mentioned reasons. Past Medical History is significant for: Widely metastatic colon cancer.  She was under the care of [facility] until last year when she moved to [place] to be seen at [facility].  She had a year of chemotherapy there.  It became too expensive for her to live alone so she has moved back.  She is requesting referral to local oncology again.  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  Nonetheless she would like to see a neurologist for this.  She would also like a referral to an audiologist as her hearing has worsened significantly in the last few years.  She also has a history of hypertension and hyperlipidemia.   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)    Moderate mitral insufficiency    Port-A-Cath in place    Seizures [facility])    Spinal stenosis    Ulcer    Urinary incontinence     Past Surgical History:  Procedure Laterality Date   ABDOMINAL HYSTERECTOMY     BACK SURGERY     due to polio   BREAST BIOPSY Bilateral    neg   BREAST BIOPSY Right 2011   neg/stereo   CARPAL TUNNEL RELEASE     CATARACT EXTRACTION Right 2020   COLONOSCOPY WITH PROPOFOL N/A 04/04/2021   Procedure: COLONOSCOPY WITH PROPOFOL;  Surgeon: Pasty Spillers, MD;  Location: [facility] ENDOSCOPY;  Service: Endoscopy;  Laterality: N/A;   ESOPHAGOGASTRODUODENOSCOPY (EGD) WITH PROPOFOL N/A 06/25/2022   Procedure: ESOPHAGOGASTRODUODENOSCOPY (EGD) WITH PROPOFOL;  Surgeon: Toney Reil, MD;  Location: [facility] ENDOSCOPY;  Service: Gastroenterology;  Laterality: N/A;   EYE SURGERY     IR IMAGING GUIDED PORT  INSERTION  10/09/2021    Social History:  reports that she has never smoked. She has never used smokeless tobacco. She reports that she does not drink alcohol and does not use drugs.  Allergies: Allergies  Allergen Reactions   Penicillins     "Everything turned black"    Family History:  Family History  Problem Relation Age of Onset   Arthritis Mother    Heart disease Mother    Stroke Mother    Hypertension Mother    COPD Mother    Sudden death Sister    Other Father        unknown medical history   Breast cancer Neg Hx      Current Outpatient Medications:    acetaminophen (TYLENOL) 500 MG tablet, Take 500 mg by mouth every 6 (six) hours as needed., Disp: , Rfl:    aspirin EC 81 MG tablet, Take 1 tablet (81 mg total) by mouth daily. Swallow whole., Disp: 30 tablet, Rfl: 12   atorvastatin (LIPITOR) 10 MG tablet, TAKE 1 TABLET BY MOUTH EVERY DAY, Disp: 90 tablet, Rfl: 0   busPIRone (BUSPAR) 5 MG tablet, TAKE 1 TABLET BY MOUTH THREE TIMES A DAY, Disp: 270 tablet, Rfl: 0   Cyanocobalamin (VITAMIN B 12 PO), Take 500 mcg by mouth daily., Disp: , Rfl:    diclofenac Sodium (VOLTAREN) 1 %  GEL, Apply 4 g topically 4 (four) times daily., Disp: 4 g, Rfl: 0   docusate sodium (COLACE) 100 MG capsule, Take 1 capsule (100 mg total) by mouth 2 (two) times daily., Disp: 60 capsule, Rfl: 0   FLUoxetine (PROZAC) 20 MG capsule, TAKE 3 CAPSULES (60 MG TOTAL) BY MOUTH EVERY MORNING., Disp: 270 capsule, Rfl: 0   gabapentin (NEURONTIN) 300 MG capsule, TAKE 1 CAPSULE BY MOUTH THREE TIMES A DAY, Disp: 90 capsule, Rfl: 3   lidocaine-prilocaine (EMLA) cream, Apply small amount of cream to port site 1-2 hour prior to chemo treatment., Disp: 30 g, Rfl: 6   loperamide (IMODIUM) 2 MG capsule, TAKE 1 CAP BY MOUTH SEE ADMIN INSTRUCTIONS. INITIAL: 4 MG, FOLLOWED BY 2 MG AFTER EACH LOOSE STOOL MAXIMUM: 16 MG/DAY, Disp: 60 capsule, Rfl: 0   loratadine (CLARITIN) 10 MG tablet, Take 1 tablet (10 mg total) by mouth See  admin instructions. Take 1 tablet daily for 4 days after each chemotherapy treatments., Disp: 90 tablet, Rfl: 0   magic mouthwash w/lidocaine SOLN, Take 5 mLs by mouth 4 (four) times daily as needed for mouth pain. Sig: Swish/Swallow 5-10 ml four times a day as needed. Dispense 480 ml. 1RF, Disp: 480 mL, Rfl: 1   meclizine (ANTIVERT) 12.5 MG tablet, Take 1 tablet (12.5 mg total) by mouth 3 (three) times daily as needed for dizziness., Disp: 30 tablet, Rfl: 0   methocarbamol (ROBAXIN) 500 MG tablet, TAKE 1 TABLET TWICE DAILY AS NEEDED FOR MUSCLE SPASM(S), Disp: 180 tablet, Rfl: 0   ondansetron (ZOFRAN) 8 MG tablet, Take 1 tablet (8 mg total) by mouth 2 (two) times daily as needed (Nausea or vomiting)., Disp: 30 tablet, Rfl: 1   polyethylene glycol powder (GLYCOLAX/MIRALAX) 17 GM/SCOOP powder, Take 17 g by mouth daily., Disp: 3350 g, Rfl: 1   potassium chloride SA (KLOR-CON M) 20 MEQ tablet, TAKE 1 TABLET EVERY DAY, Disp: 90 tablet, Rfl: 3   triamterene-hydrochlorothiazide (MAXZIDE-25) 37.5-25 MG tablet, TAKE 1 TABLET EVERY DAY, Disp: 90 tablet, Rfl: 3   ALPRAZolam (XANAX) 0.5 MG tablet, TAKE 1 TABLET BY MOUTH TWICE A DAY AS NEEDED FOR ANXIETY 10.11.23, Disp: 60 tablet, Rfl: 0 No current facility-administered medications for this visit.  Facility-Administered Medications Ordered in Other Visits:    heparin lock flush 100 UNIT/ML injection, , , ,    palonosetron (ALOXI) 0.25 MG/5ML injection, , , ,    prochlorperazine (COMPAZINE) 10 MG tablet, , , ,   Review of Systems:  Negative unless indicated in HPI.   Physical Exam: Vitals:   01/04/24 1407 01/04/24 1410  BP: (!) 140/80 138/80  Pulse: 65   SpO2: 99%   Weight: 134 lb (60.8 kg)   Height: 4\' 9"  (1.448 m)     Body mass index is 29 kg/m.   Physical Exam Vitals reviewed.  Constitutional:      Appearance: Normal appearance.  HENT:     Head: Normocephalic and atraumatic.  Eyes:     Conjunctiva/sclera: Conjunctivae normal.   Cardiovascular:     Rate and Rhythm: Normal rate and regular rhythm.  Pulmonary:     Effort: Pulmonary effort is normal.     Breath sounds: Normal breath sounds.  Skin:    General: Skin is warm and dry.  Neurological:     General: No focal deficit present.     Mental Status: She is alert and oriented to person, place, and time.  Psychiatric:        Mood and  Affect: Mood normal.        Behavior: Behavior normal.        Thought Content: Thought content normal.        Judgment: Judgment normal.      Impression and Plan:  Metastatic colon cancer to liver (HCC) -     Ambulatory referral to Hematology / Oncology  GAD (generalized anxiety disorder) -     ALPRAZolam; TAKE 1 TABLET BY MOUTH TWICE A DAY AS NEEDED FOR ANXIETY 10.11.23  Dispense: 60 tablet; Refill: 0  Hearing loss, unspecified hearing loss type, unspecified laterality -     Ambulatory referral to Audiology  Neuropathy -     Ambulatory referral to Neurology  Essential hypertension  Pure hypercholesterolemia   -Previous charts reviewed in detail. -Will place referral back to oncology. -Not sure how neurology will be helpful to her with her current neuropathy issues, nonetheless I will place referral. -Agree with audiology referral for hearing loss. -I have refilled her alprazolam that she takes twice daily as needed. -She will schedule follow-up for annual physical at which time we will check her basic labs.  Time spent:47 minutes reviewing chart, interviewing and examining patient and formulating plan of care.     Chaya Jan, MD [facility]

## 2024-01-05 ENCOUNTER — Encounter: Payer: Self-pay | Admitting: Oncology

## 2024-01-07 ENCOUNTER — Other Ambulatory Visit: Payer: Medicare (Managed Care)

## 2024-01-10 NOTE — Progress Notes (Unsigned)
 [facility]   [phone] Fax:([phone]   Clinic Note   Patient Care Team: Philip Aspen, Limmie Patricia, MD as PCP - General (Internal Medicine) Benita Gutter, RN as Oncology Nurse Navigator Rickard Patience, MD as Consulting Physician (Oncology) 01/11/2024  CHIEF COMPLAINTS/PURPOSE OF CONSULTATION:  Metastatic colon cancer, referred by PCP Dr. Chaya Jan  History of Present Illness   Chelsea [name] is a 78 yo female with PMH including HTN, HL, OSA, anxiety, depression, and spinal stenosis is here because of metastatic colon cancer previously followed by Dr. Cathie Hoops until she moved to [facility] to be treated at [facility]. It became cost prohibitive to live there so she has relocated back to the area and is here to establish care. Initially, she had an incidental finding of soft tissue mass in the cecum on CT 03/2021 while undergoing evaluation for spinal pain. 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 Updated CT 08/21/21 showed an enlarging cecal mass up to 5 cm and well as development of multiple low density liver lesions concerning for metastasis, and an enlarging complex mass at the vaginal cuff. Due to concern for metastatic disease, she was deemed not a surgical candidate. Liver biopsy 09/04/21 confirmed metastatic colon cancer, MMR normal. Foundation One done 10/24/21 showed MSI-stable disease with KRAS G12V mutation. She began first line systemic chemotherapy with 5FU/leuc and bevacizumab on 10/20/21. Restaging scans showed progression and Oxaliplatin was added with cycle 7 on 01/21/22. She continued FOLFOX/Beva x12 cycles, oxali was removed with cycle 18 on 09/07/22. She continued 5FU/Beva for 4 additional cycles  stability without new metastatic disease. She completed  cycle 22 on 11/20/22 prior to moving to [place]. At [facility] she received additional cycles of 5FU/Beva chemo through November 2024. Overtime she developed chemo intolerance with weight loss, constipation/diarrhea, fatigue, neuropathy, tongue discoloration and mucositis. She underwent liver histotripsy to 3 cm left lobe on 11/02/23, and has been on observation. Recent abdominal MRI 12/27/23 showed enlarging liver lesions. CEA has been rising over past 6 months 7 --> 15 --> 48 on 12/03/23.  Socially she is widowed, has 3 children 1 daughter here today lives locally.  She has many grand children and great-grandchildren.  She has help 1-2 times a week bathing, but has started cooking again.  She does not drive.  She lives alone in an apartment and values her independence.  Denies alcohol, tobacco, or other drug use.  Denies family history of cancer.  Today she presents with her daughter and son-in-law, feels well overall. Bowels moving normally usually once per day. Sometimes urgent depending on what she eats.  Denies black/bloody stools or abdominal pain.  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 Ambulates with a walker, no recent fall.       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)    Moderate mitral insufficiency    Port-A-Cath in place    Seizures [facility])    Spinal stenosis    Ulcer    Urinary incontinence     SURGICAL HISTORY: Past Surgical History:  Procedure Laterality Date   ABDOMINAL HYSTERECTOMY     BACK SURGERY     due to polio   BREAST BIOPSY Bilateral    neg   BREAST BIOPSY Right 2011   neg/stereo   CARPAL TUNNEL RELEASE     CATARACT EXTRACTION Right 2020   COLONOSCOPY WITH PROPOFOL N/A 04/04/2021   Procedure: COLONOSCOPY WITH  PROPOFOL;  Surgeon: Pasty Spillers, MD;  Location: [facility] ENDOSCOPY;  Service: Endoscopy;  Laterality: N/A;   ESOPHAGOGASTRODUODENOSCOPY (EGD) WITH PROPOFOL N/A 06/25/2022   Procedure: ESOPHAGOGASTRODUODENOSCOPY (EGD) WITH PROPOFOL;  Surgeon: Toney Reil, MD;  Location: [facility] ENDOSCOPY;  Service: Gastroenterology;  Laterality: N/A;   EYE SURGERY     IR IMAGING GUIDED PORT INSERTION  10/09/2021    SOCIAL HISTORY: Social History   Socioeconomic History   Marital status: Divorced    Spouse name: Not on file   Number of children: Not on file   Years of education: Not on file   Highest education level: Not on file  Occupational History   Not on file  Tobacco Use   Smoking status: Never   Smokeless tobacco: Never  Vaping Use   Vaping status: Never Used  Substance and Sexual Activity   Alcohol use: Never   Drug use: No   Sexual activity: Not Currently  Other Topics Concern   Not on file  Social History Narrative   Not on file   Social Drivers of Health   Financial Resource Strain: Low Risk  (01/12/2022)   Overall Financial Resource Strain (CARDIA)    Difficulty of Paying Living Expenses: Not very hard  Food Insecurity: No Food Insecurity (01/11/2024)   Hunger Vital Sign    Worried About Running Out of Food in the Last Year: Never true    Ran Out of Food in the Last Year: Never true  Transportation Needs: No Transportation Needs (01/11/2024)   PRAPARE - Administrator, Civil Service (Medical): No    Lack of Transportation (Non-Medical): No  Physical Activity: Inactive (01/12/2022)   Exercise Vital Sign    Days of Exercise per Week: 0 days    Minutes of Exercise per Session: 0 min  Stress: Stress Concern Present (01/12/2022)   [employer] of Occupational Health - Occupational Stress Questionnaire    Feeling of Stress : To some extent  Social Connections: Moderately Integrated (01/12/2022)   Social Connection and Isolation Panel [NHANES]    Frequency of  Communication with Friends and Family: Three times a week    Frequency of Social Gatherings with Friends and Family: Three times a week    Attends Religious Services: 1 to 4 times per year    Active Member of Clubs or Organizations: No    Attends Banker Meetings: 1 to 4 times per year    Marital Status: Widowed  Intimate Partner Violence: Not At Risk (01/11/2024)   Humiliation, Afraid, Rape, and Kick questionnaire    Fear of Current or Ex-Partner: No    Emotionally Abused: No    Physically Abused: No    Sexually Abused: No    FAMILY HISTORY: Family History  Problem Relation Age of Onset   Arthritis Mother    Heart disease Mother    Stroke Mother    Hypertension Mother    COPD Mother    Sudden death Sister    Other Father        unknown medical history   Breast cancer Neg Hx  ALLERGIES:  is allergic to penicillins.  MEDICATIONS:  Current Outpatient Medications  Medication Sig Dispense Refill   acetaminophen (TYLENOL) 500 MG tablet Take 500 mg by mouth every 6 (six) hours as needed.     ALPRAZolam (XANAX) 0.5 MG tablet TAKE 1 TABLET BY MOUTH TWICE A DAY AS NEEDED FOR ANXIETY 10.11.23 60 tablet 0   aspirin EC 81 MG tablet Take 1 tablet (81 mg total) by mouth daily. Swallow whole. 30 tablet 12   atorvastatin (LIPITOR) 10 MG tablet TAKE 1 TABLET BY MOUTH EVERY DAY 90 tablet 0   busPIRone (BUSPAR) 5 MG tablet TAKE 1 TABLET BY MOUTH THREE TIMES A DAY 270 tablet 0   Cyanocobalamin (VITAMIN B 12 PO) Take 500 mcg by mouth daily.     diclofenac Sodium (VOLTAREN) 1 % GEL Apply 4 g topically 4 (four) times daily. 4 g 0   docusate sodium (COLACE) 100 MG capsule Take 1 capsule (100 mg total) by mouth 2 (two) times daily. 60 capsule 0   FLUoxetine (PROZAC) 20 MG capsule TAKE 3 CAPSULES (60 MG TOTAL) BY MOUTH EVERY MORNING. 270 capsule 0   gabapentin (NEURONTIN) 300 MG capsule TAKE 1 CAPSULE BY MOUTH THREE TIMES A DAY 90 capsule 3   lidocaine-prilocaine (EMLA) cream Apply  small amount of cream to port site 1-2 hour prior to chemo treatment. 30 g 6   loperamide (IMODIUM) 2 MG capsule TAKE 1 CAP BY MOUTH SEE ADMIN INSTRUCTIONS. INITIAL: 4 MG, FOLLOWED BY 2 MG AFTER EACH LOOSE STOOL MAXIMUM: 16 MG/DAY 60 capsule 0   loratadine (CLARITIN) 10 MG tablet Take 1 tablet (10 mg total) by mouth See admin instructions. Take 1 tablet daily for 4 days after each chemotherapy treatments. 90 tablet 0   magic mouthwash w/lidocaine SOLN Take 5 mLs by mouth 4 (four) times daily as needed for mouth pain. Sig: Swish/Swallow 5-10 ml four times a day as needed. Dispense 480 ml. 1RF 480 mL 1   meclizine (ANTIVERT) 12.5 MG tablet Take 1 tablet (12.5 mg total) by mouth 3 (three) times daily as needed for dizziness. 30 tablet 0   methocarbamol (ROBAXIN) 500 MG tablet TAKE 1 TABLET TWICE DAILY AS NEEDED FOR MUSCLE SPASM(S) 180 tablet 0   ondansetron (ZOFRAN) 8 MG tablet Take 1 tablet (8 mg total) by mouth 2 (two) times daily as needed (Nausea or vomiting). 30 tablet 1   polyethylene glycol powder (GLYCOLAX/MIRALAX) 17 GM/SCOOP powder Take 17 g by mouth daily. 3350 g 1   potassium chloride SA (KLOR-CON M) 20 MEQ tablet TAKE 1 TABLET EVERY DAY 90 tablet 3   triamterene-hydrochlorothiazide (MAXZIDE-25) 37.5-25 MG tablet TAKE 1 TABLET EVERY DAY 90 tablet 3   No current facility-administered medications for this visit.   Facility-Administered Medications Ordered in Other Visits  Medication Dose Route Frequency Provider Last Rate Last Admin   heparin lock flush 100 UNIT/ML injection            palonosetron (ALOXI) 0.25 MG/5ML injection            prochlorperazine (COMPAZINE) 10 MG tablet             REVIEW OF SYSTEMS:   Constitutional: Denies fevers, chills or abnormal night sweats Eyes: Denies blurriness of vision, double vision or watery eyes Ears, nose, mouth, throat, and face: Denies mucositis or sore throat Respiratory: Denies cough, dyspnea or wheezes (+) allergies Cardiovascular:  Denies palpitation, chest discomfort or lower extremity swelling Gastrointestinal:  Denies nausea, vomiting, constipation, diarrhea, hematochezia,  pain, heartburn or change in bowel habits Skin: Denies abnormal skin rashes Lymphatics: Denies new lymphadenopathy or easy bruising Neurological:Denies numbness, tingling or new weaknesses (+) neuropathy from chemo and spinal stenosis  Behavioral/Psych: Mood is stable, no new changes (+) anxiety, stable All other systems were reviewed with the patient and are negative.  PHYSICAL EXAMINATION: ECOG PERFORMANCE STATUS: 0 - Asymptomatic  Vitals:   01/11/24 1245  BP: 132/61  Pulse: 67  Resp: 16  Temp: 97.8 F (36.6 C)  SpO2: 98%   Filed Weights   01/11/24 1245  Weight: 133 lb 4.8 oz (60.5 kg)    GENERAL:alert, no distress and comfortable SKIN: skin color, texture, turgor are normal, no rashes or significant lesions EYES: sclera clear NECK: without mass LYMPH:  no palpable cervical or supraclavicular lymphadenopathy  LUNGS: clear with normal breathing effort HEART: regular rate & rhythm, no lower extremity edema ABDOMEN: abdomen soft, non-tender and normal bowel sounds. No hepatomegaly or mass Musculoskeletal:no cyanosis of digits and no clubbing  PSYCH: alert & oriented x 3 with fluent speech NEURO: no focal motor deficits    LABORATORY DATA:  I have reviewed the data as listed    Latest Ref Rng & Units 11/20/2022    8:28 AM 10/26/2022    9:25 AM 10/20/2022   12:57 PM  CBC  WBC 4.0 - 10.5 K/uL 3.5  6.3  2.5   Hemoglobin 12.0 - 15.0 g/dL 47.8  29.5  9.7   Hematocrit 36.0 - 46.0 % 33.6  31.8  31.1   Platelets 150 - 400 K/uL 189  146  123       Latest Ref Rng & Units 11/20/2022    8:28 AM 10/26/2022    9:25 AM 10/20/2022   12:57 PM  CMP  Glucose 70 - 99 mg/dL 92  95  99   BUN 8 - 23 mg/dL 14  12  14    Creatinine 0.44 - 1.00 mg/dL 6.21  3.08  6.57   Sodium 135 - 145 mmol/L 137  138  139   Potassium 3.5 - 5.1 mmol/L 3.6  3.2   3.5   Chloride 98 - 111 mmol/L 102  98  101   CO2 22 - 32 mmol/L 29  28  30    Calcium 8.9 - 10.3 mg/dL 9.0  9.1  8.8   Total Protein 6.5 - 8.1 g/dL 6.8  7.6  6.8   Total Bilirubin 0.3 - 1.2 mg/dL 0.4  0.2  0.4   Alkaline Phos 38 - 126 U/L 74  82  55   AST 15 - 41 U/L 20  19  18    ALT 0 - 44 U/L 11  13  11       RADIOGRAPHIC STUDIES: I have personally reviewed the radiological images as listed and agreed with the findings in the report. MR ABDOMEN WWO CONTRAST Result Date: 01/06/2024 CLINICAL DATA:  Metastatic colon cancer restaging EXAM: MRI ABDOMEN WITHOUT AND WITH CONTRAST TECHNIQUE: Multiplanar multisequence MR imaging of the abdomen was performed both before and after the administration of intravenous contrast. CONTRAST:  6 mL Vueway gadolinium contrast IV COMPARISON:  CT chest abdomen pelvis, 10/13/2022 FINDINGS: Lower chest: No acute abnormality. Hepatobiliary: Interval enlargement of multiple hypoenhancing liver metastases, largest in the superior left lobe of the liver, hepatic segment II, measuring 2.8 x 2.7 cm, previously 0.7 cm (series 12, image 21), additional index lesion in the central posterior right lobe of the liver, hepatic segment V/VIII, measuring 3.2 x 3.0 cm,  previously 0.7 cm (series 12, image 24). Intrinsically T2 hyperintense, rim T2 hypointense, T1 hyperintense lesion of the margin of inferior hepatic segment IVA adjacent to the gallbladder fossa is new compared to prior examination, measuring 2.3 x 1.8 cm, without internal contrast enhancement appearance suggestive of an ablation site (series 11, image 65). Gallstones. No gallbladder wall thickening or biliary ductal dilatation. Pancreas: Unremarkable. No pancreatic ductal dilatation or surrounding inflammatory changes. Spleen: Normal in size without significant abnormality. Adrenals/Urinary Tract: Adrenal glands are unremarkable. Kidneys are normal, without renal calculi, solid lesion, or hydronephrosis. Stomach/Bowel: Stomach  is within normal limits. No evidence of bowel wall thickening, distention, or inflammatory changes. Vascular/Lymphatic: No significant vascular findings are present. No enlarged abdominal lymph nodes. Other: No abdominal wall hernia or abnormality. No ascites. Musculoskeletal: No acute or significant osseous findings. IMPRESSION: 1. When compared to prior examination dated 10/13/2022, interval enlargement of multiple hypoenhancing liver metastases, consistent progression of hepatic metastatic disease. 2. New lesion of the margin of inferior hepatic segment IVA adjacent to the gallbladder fossa with hemorrhagic or proteinaceous contents, appearance suggesting an ablation site. Correlate with interval procedural history or imaging performed in the interval to prior examination dated 10/13/2022. 3. No other evidence of lymphadenopathy or metastatic disease in the abdomen. 4. Cholelithiasis. Electronically Signed   By: Jearld Lesch M.D.   On: 01/06/2024 18:43    ASSESSMENT & PLAN: 78 yo female   Colon cancer of the cecum, liver metastasis, stage IV, MMR normal, KRAS G12V+ -Incidental finding of soft tissue mass in the cecum on CT 03/2021 while undergoing evaluation for spinal pain.  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  -Updated CT 08/21/21 showed an enlarging cecal mass up to 5 cm and well as development of multiple low density liver lesions concerning for metastasis, and an enlarging complex mass at the vaginal cuff).  -Due to concern for metastatic disease, she was deemed not a surgical candidate.  -Liver biopsy 09/04/21 confirmed metastatic colon cancer, MMR normal.  -Foundation One 10/24/21 showed MSI-stable disease with KRAS G12V mutation.  -Treatment course: began first line systemic chemotherapy with 5FU/leuc and bevacizumab on 10/20/21. Restaging scans showed  progression and Oxaliplatin was added with cycle 7 on 01/21/22. She continued FOLFOX/Beva x12 cycles, oxali was removed with cycle 18 on 09/07/22. She continued 5FU/Beva for 4 additional cycles. She completed cycle 22 on 11/20/22 prior to moving to [place]. At [facility] she received additional cycles of 5FU/Beva through November 2024.  -Chemo toxicities: Overtime she developed chemo intolerance with weight loss, constipation/diarrhea, fatigue, neuropathy, tongue discoloration and mucositis. Required GCSF for neutropenia -Most recently, s/p liver histotripsy to 3 cm left lobe on 11/02/23 at [facility] and has been on observation. Chemo SEs resolved and she is asymptomatic with very good PS.  -Recent abdominal MRI 12/27/23 showed enlarging liver lesions. CEA has been rising over past 6 months 7 --> 15 --> 48 on 12/03/23. -Patient seen with Dr. Mosetta Putt. We recommend whole body imaging to evaluate distant metastatic disease. Will present her case in GI conference to see if she is a candidate for further liver targeted therapy.  -We recommend liquid biopsy with Guardant 360 to see if she is a candidate for immunotherapy or other targeted therapy. Her goal is to remain off intensive chemo if she can, although may consider low intensity treatment such as Xeloda. She wants to live longer  but values her quality of life -F/up in 3 weeks with lab, to review scan and GI conference  Weight loss -178 lbs when she began chemo, down to 125 on last cycle 11/2022 -She has since gained ~10 lbs off chemo, appetite improved   Social -Has supportive family and extended relatives, does not drive herself -Daughter and son in law are present today, live locally  -Referred to SW/Susan for transportation    PLAN: -Medical record reviewed -CT CAP in 2 weeks -Review case in GI conference -Lab (CBC, CMP, CEA, and Guardant 360) and f/up in 3 weeks for further care planning -Refer to Darl Pikes re: transportation -Pt seen with  Dr. Mosetta Putt, met GI navigator Kristy    Orders Placed This Encounter  Procedures   CT CHEST ABDOMEN PELVIS W CONTRAST    Standing Status:   Future    Expected Date:   01/25/2024    Expiration Date:   01/10/2025    If indicated for the ordered procedure, I authorize the administration of contrast media per Radiology protocol:   Yes    Does the patient have a contrast media/X-ray dye allergy?:   No    Preferred imaging location?:   [facility]    If indicated for the ordered procedure, I authorize the administration of oral contrast media per Radiology protocol:   Yes   CBC with Differential (Cancer Center Only)    Standing Status:   Future    Expiration Date:   01/10/2025   CEA (Access)-[facility] ONLY    Standing Status:   Future    Expiration Date:   01/10/2025   CMP (Cancer Center only)    Standing Status:   Future    Expiration Date:   01/10/2025   Guardant 360    Standing Status:   Future    Expiration Date:   01/10/2025   Ambulatory referral to Social Work    Referral Priority:   Routine    Referral Type:   Consultation    Referral Reason:   Specialty Services Required    Referred to Provider:   Rachel Moulds, LCSW    Number of Visits Requested:   1    All questions were answered. The patient knows to call the clinic with any problems, questions or concerns.      Pollyann Samples, NP 01/11/2024   Addendum I have seen the patient, examined her. I agree with the assessment and and plan and have edited the notes.   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  She was previously under my partner Dr. Bethanne Ginger care in [place], and moved to [place] and received her maintenance chemo 5-FU and bevacizumab until November 2024.  Chemo was stopped due to fatigue and the side effect.  She did receive liver ablation to one of her liver lesions at [facility].  Her recent  liver MRI showed a disease progression compared to a year ago.  She has been feeling well since chemo after chemo, and is reluctant to restart chemo.  We discussed the option of liver targeted therapy, such as Y90, or single agent chemo such as Xeloda, or irinotecan.  Will review her case in the tumor board next week to see if she is a candidate for liver targeted therapy.  I plan to obtain CT chest, abdomen pelvis to rule out extrahepatic disease, although we  know she does have the primary tumor in the cecum.  All questions were answered.  Plan to see her back after CT scan to finalize her treatment.  I spent a total of 40 minutes for her visit today, more than 50% time on face-to-face counseling.  Malachy Mood MD 01/11/2024

## 2024-01-11 ENCOUNTER — Encounter: Payer: Self-pay | Admitting: Nurse Practitioner

## 2024-01-11 ENCOUNTER — Telehealth: Payer: Self-pay | Admitting: Nurse Practitioner

## 2024-01-11 ENCOUNTER — Inpatient Hospital Stay: Payer: Medicare (Managed Care) | Attending: Nurse Practitioner | Admitting: Nurse Practitioner

## 2024-01-11 ENCOUNTER — Encounter: Payer: Self-pay | Admitting: Oncology

## 2024-01-11 VITALS — BP 132/61 | HR 67 | Temp 97.8°F | Resp 16 | Ht <= 58 in | Wt 133.3 lb

## 2024-01-11 DIAGNOSIS — C189 Malignant neoplasm of colon, unspecified: Secondary | ICD-10-CM

## 2024-01-11 DIAGNOSIS — C787 Secondary malignant neoplasm of liver and intrahepatic bile duct: Secondary | ICD-10-CM | POA: Insufficient documentation

## 2024-01-11 DIAGNOSIS — C180 Malignant neoplasm of cecum: Secondary | ICD-10-CM | POA: Insufficient documentation

## 2024-01-11 NOTE — Telephone Encounter (Signed)
 Scheduled patient's appts. Advised patient to contact us if rescheduling is needed. Provided my direct line.

## 2024-01-12 ENCOUNTER — Encounter: Payer: Self-pay | Admitting: Oncology

## 2024-01-12 ENCOUNTER — Other Ambulatory Visit: Payer: Self-pay

## 2024-01-12 ENCOUNTER — Inpatient Hospital Stay
Admission: RE | Admit: 2024-01-12 | Discharge: 2024-01-12 | Disposition: A | Discharge status: Home / Self Care | Payer: Self-pay | Source: Ambulatory Visit | Attending: Hematology | Admitting: Hematology

## 2024-01-12 DIAGNOSIS — C189 Malignant neoplasm of colon, unspecified: Secondary | ICD-10-CM

## 2024-01-12 NOTE — Progress Notes (Signed)
 PATIENT NAVIGATOR PROGRESS NOTE  Name: Chelsea [name] Date: 01/12/2024 MRN: [id number]  DOB: 1946/09/10   Reason for visit:  New Patient Appointment  Comments:    Patient in office for new patient appointment with Santiago Glad, NP.  Introduced self to patient and gave patient direct contact information.  Instructed patient to contact me if she had any questions or concerns.   Patient's case will be discussed at upcoming multidisciplinary conference.  Request faxed to [facility] to have CT images from scans in 2024 power shared with our offices.  Will follow-up to ensure images have been shared.     Time spent counseling/coordinating care: > 60 minutes

## 2024-01-13 ENCOUNTER — Inpatient Hospital Stay: Payer: Medicare (Managed Care) | Admitting: Licensed Clinical Social Worker

## 2024-01-13 DIAGNOSIS — C189 Malignant neoplasm of colon, unspecified: Secondary | ICD-10-CM

## 2024-01-14 ENCOUNTER — Encounter: Payer: Self-pay | Admitting: Oncology

## 2024-01-14 NOTE — Progress Notes (Signed)
 [facility] Clinical Social Work  Initial Assessment   Chelsea [name] is a 78 y.o. year old female contacted by phone. Clinical Social Work was referred by medical provider for assessment of psychosocial needs.   SDOH (Social Determinants of Health) assessments performed: Yes SDOH Interventions    Flowsheet Row Social Work from 01/12/2022 in [facility] [place] Office Visit from 07/16/2021 in [facility] Office Visit from 11/22/2019 in [facility] Office Visit from 08/30/2019 in [facility]  SDOH Interventions      Food Insecurity Interventions Intervention Not Indicated -- -- --  Housing Interventions Intervention Not Indicated -- -- --  Transportation Interventions Intervention Not Indicated -- -- --  Depression Interventions/Treatment  -- Medication Counseling, Medication Currently on Treatment, Counseling, Medication  Financial Strain Interventions Intervention Not Indicated -- -- --  Physical Activity Interventions Intervention Not Indicated -- -- --  Stress Interventions Provide Counseling -- -- --  Social Connections Interventions Intervention Not Indicated -- -- --       SDOH Screenings   Food Insecurity: No Food Insecurity (01/11/2024)  Housing: Unknown (01/11/2024)  Transportation Needs: No Transportation Needs (01/11/2024)  Utilities: Not At Risk (01/11/2024)  Alcohol Screen: Low Risk  (01/12/2022)  Depression (PHQ2-9): Low Risk  (01/11/2024)  Recent Concern: Depression (PHQ2-9) - Medium Risk (01/04/2024)  Financial Resource Strain: Low Risk  (01/12/2022)  Physical Activity: Inactive (01/12/2022)  Social Connections: Moderately Integrated (01/12/2022)  Stress: Stress Concern Present (01/12/2022)  Tobacco Use: Low Risk  (01/11/2024)     Distress Screen completed: No    03/26/2021    2:21 PM  ONCBCN DISTRESS SCREENING  Screening Type Initial Screening   Distress experienced in past week (1-10) 3      Family/Social Information:  Housing Arrangement: patient lives alone, pt reports a home health aide assists her with bathing and some housekeeping a couple of days a week which she pays out of pocket for. Family members/support persons in your life? Pt has a daughter who resides locally and assists as she is able.   Transportation concerns: yes  Employment: Retired .  Income source: Actor concerns: Yes, current concerns Type of concern: Utilities Food access concerns: no Religious or spiritual practice: Yes-[employer] Advanced directives:  No, pt informed of advanced directives clinics and signed up for the clinic on 4/25 to complete Services Currently in place:  none  Coping/ Adjustment to diagnosis: Patient understands treatment plan and what happens next? 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: pt's priority is to start treatment Patient enjoys time with family/ friends Current coping skills/ strengths: Capable of independent living , Motivation for treatment/growth , and Physical Health     SUMMARY: Current SDOH Barriers:  Financial constraints related to fixed income and Transportation  Clinical Social Work Clinical Goal(s):  Explore community resource options for unmet needs related to:  Corporate treasurer  and Transportation  Interventions: Discussed common feeling and emotions when being diagnosed with cancer, and the importance of support during treatment Informed patient of the support team roles and support services at [facility] Provided CSW contact information and encouraged patient to call with any questions or concerns Referred patient to transportation department, encouraged to apply for food stamps to apply for the [employer], signed up for advanced directives clinic   Follow  Up Plan:  Patient will contact CSW with any support or resource needs Patient verbalizes understanding of plan: Yes    Rachel Moulds, LCSW Clinical Social Worker [facility]

## 2024-01-19 ENCOUNTER — Other Ambulatory Visit: Payer: Self-pay

## 2024-01-19 NOTE — Progress Notes (Signed)
 PATIENT NAVIGATOR PROGRESS NOTE  Name: CHRISMA HURLOCK Date: 01/19/2024 MRN: [id number]  DOB: 05-24-1946   Reason for visit:  Phone call follow-up   Comments:   Patient's case was discussed during this morning's GI Conference.  Patient was called to get CT scan scheduled. During phone call, patient voiced concern with possible need for transportation.  Referral email sent to get patient set up with [facility] transportation.  Patient informed of date and time of scan and verbalized understanding of instructions (3pm scan with arrival time of 1pm. NPO 4 hours prior).  Patient agreed to contact office if she had any future questions or concerns.     Time spent counseling/coordinating care: 45-60 minutes

## 2024-01-20 ENCOUNTER — Telehealth: Payer: Self-pay | Admitting: Internal Medicine

## 2024-01-20 NOTE — Telephone Encounter (Signed)
 Copied from CRM [id number]. Topic: Referral - Question >> Jan 20, 2024  2:11 PM Kita Perish H wrote: Reason for CRM: Patient was given a referral for Audiology and the office where she was referred to is out of network with [facility]. Patient needs a referral to another facility.  Sherise [phone]

## 2024-01-25 ENCOUNTER — Encounter: Payer: Self-pay | Admitting: Hematology

## 2024-01-25 ENCOUNTER — Ambulatory Visit (HOSPITAL_COMMUNITY)
Admission: RE | Admit: 2024-01-25 | Discharge: 2024-01-25 | Disposition: A | Discharge status: Home / Self Care | Payer: Medicare (Managed Care) | Source: Ambulatory Visit | Attending: Nurse Practitioner | Admitting: Nurse Practitioner

## 2024-01-25 DIAGNOSIS — C787 Secondary malignant neoplasm of liver and intrahepatic bile duct: Secondary | ICD-10-CM | POA: Diagnosis present

## 2024-01-25 DIAGNOSIS — C189 Malignant neoplasm of colon, unspecified: Secondary | ICD-10-CM | POA: Insufficient documentation

## 2024-01-25 MED ORDER — IOHEXOL 300 MG/ML  SOLN
100.0000 mL | Freq: Once | INTRAMUSCULAR | Status: AC | PRN
  Administered 2024-01-25: 100 mL via INTRAVENOUS

## 2024-01-25 MED ORDER — IOHEXOL 9 MG/ML PO SOLN
ORAL | Status: AC
  Filled 2024-01-25: qty 1000

## 2024-01-25 MED ORDER — SODIUM CHLORIDE (PF) 0.9 % IJ SOLN
INTRAMUSCULAR | Status: AC
  Filled 2024-01-25: qty 50

## 2024-01-25 MED ORDER — IOHEXOL 9 MG/ML PO SOLN
1000.0000 mL | ORAL | Status: AC
  Administered 2024-01-25: 1000 mL via ORAL

## 2024-01-26 ENCOUNTER — Encounter: Payer: Self-pay | Admitting: Oncology

## 2024-01-26 ENCOUNTER — Telehealth: Payer: Self-pay | Admitting: Hematology

## 2024-01-26 NOTE — Telephone Encounter (Signed)
 Denise re-scheduled Ladana's appointments.

## 2024-01-26 NOTE — Progress Notes (Signed)
 The proposed treatment discussed in conference is for discussion purpose only and is not a binding recommendation.  The patients have not been physically examined, or presented with their treatment options.  Therefore, final treatment plans cannot be decided.

## 2024-01-26 NOTE — Telephone Encounter (Signed)
 Left the patients daughter a voicemail stating to call us  back to reschedule her mothers appointments per the patients request. She wanted to mover her appointments so that the daughter could attend.

## 2024-01-28 ENCOUNTER — Inpatient Hospital Stay: Payer: Medicare (Managed Care) | Admitting: Licensed Clinical Social Worker

## 2024-02-01 ENCOUNTER — Ambulatory Visit: Payer: Medicare (Managed Care) | Admitting: Hematology

## 2024-02-01 ENCOUNTER — Other Ambulatory Visit: Payer: Medicare (Managed Care)

## 2024-02-02 ENCOUNTER — Encounter: Payer: Self-pay | Admitting: Internal Medicine

## 2024-02-02 ENCOUNTER — Ambulatory Visit (INDEPENDENT_AMBULATORY_CARE_PROVIDER_SITE_OTHER): Payer: Medicare (Managed Care) | Admitting: Internal Medicine

## 2024-02-02 VITALS — BP 110/70 | HR 62 | Temp 98.4°F | Ht <= 58 in | Wt 136.8 lb

## 2024-02-02 DIAGNOSIS — E876 Hypokalemia: Secondary | ICD-10-CM | POA: Diagnosis not present

## 2024-02-02 DIAGNOSIS — I10 Essential (primary) hypertension: Secondary | ICD-10-CM | POA: Diagnosis not present

## 2024-02-02 DIAGNOSIS — E7800 Pure hypercholesterolemia, unspecified: Secondary | ICD-10-CM | POA: Diagnosis not present

## 2024-02-02 DIAGNOSIS — F411 Generalized anxiety disorder: Secondary | ICD-10-CM

## 2024-02-02 DIAGNOSIS — Z0000 Encounter for general adult medical examination without abnormal findings: Secondary | ICD-10-CM | POA: Diagnosis not present

## 2024-02-02 MED ORDER — ALPRAZOLAM 0.5 MG PO TABS: ORAL_TABLET | ORAL | 3 refills | Status: DC

## 2024-02-02 NOTE — Progress Notes (Signed)
 Established Patient Office Visit     CC/Reason for Visit: Subsequent Medicare wellness visit  HPI: Chelsea [name] is a 78 y.o. female who is coming in today for the above mentioned reasons. Past Medical History is significant for: Metastatic colon cancer.  She has reestablished care with local oncology.  She is overdue for Tdap, shingles, pneumonia vaccinations.  She elects to defer further cancer screening.   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)    Moderate mitral insufficiency    Port-A-Cath in place    Seizures [facility])    Spinal stenosis    Ulcer    Urinary incontinence     Past Surgical History:  Procedure Laterality Date   ABDOMINAL HYSTERECTOMY     BACK SURGERY     due to polio   BREAST BIOPSY Bilateral    neg   BREAST BIOPSY Right 2011   neg/stereo   CARPAL TUNNEL RELEASE     CATARACT EXTRACTION Right 2020   COLONOSCOPY WITH PROPOFOL  N/A 04/04/2021   Procedure: COLONOSCOPY WITH PROPOFOL ;  Surgeon: Irby Mannan, MD;  Location: [facility] ENDOSCOPY;  Service: Endoscopy;  Laterality: N/A;   ESOPHAGOGASTRODUODENOSCOPY (EGD) WITH PROPOFOL  N/A 06/25/2022   Procedure: ESOPHAGOGASTRODUODENOSCOPY (EGD) WITH PROPOFOL ;  Surgeon: Selena Daily, MD;  Location: [facility] ENDOSCOPY;  Service: Gastroenterology;  Laterality: N/A;   EYE SURGERY     IR IMAGING GUIDED PORT INSERTION  10/09/2021    Social History:  reports that she has never smoked. She has never used smokeless tobacco. She reports that she does not drink alcohol and does not use drugs.  Allergies: Allergies  Allergen Reactions   Penicillins     "Everything turned black"    Family History:  Family History  Problem Relation Age of Onset   Arthritis Mother    Heart disease Mother    Stroke Mother    Hypertension Mother    COPD Mother     Sudden death Sister    Other Father        unknown medical history   Breast cancer Neg Hx      Current Outpatient Medications:    acetaminophen  (TYLENOL ) 500 MG tablet, Take 500 mg by mouth every 6 (six) hours as needed., Disp: , Rfl:    aspirin  EC 81 MG tablet, Take 1 tablet (81 mg total) by mouth daily. Swallow whole., Disp: 30 tablet, Rfl: 12   atorvastatin  (LIPITOR) 10 MG tablet, TAKE 1 TABLET BY MOUTH EVERY DAY, Disp: 90 tablet, Rfl: 0   busPIRone  (BUSPAR ) 5 MG tablet, TAKE 1 TABLET BY MOUTH THREE TIMES A DAY, Disp: 270 tablet, Rfl: 0   Cyanocobalamin  (VITAMIN B 12 PO), Take 500 mcg by mouth daily., Disp: , Rfl:    diclofenac  Sodium (VOLTAREN ) 1 % GEL, Apply 4 g topically 4 (four) times daily., Disp: 4 g, Rfl: 0   docusate sodium  (COLACE) 100 MG capsule, Take 1 capsule (100 mg total) by mouth 2 (two) times daily., Disp: 60 capsule, Rfl: 0   FLUoxetine  (PROZAC ) 20 MG capsule, TAKE 3 CAPSULES (60 MG TOTAL) BY MOUTH EVERY MORNING., Disp: 270 capsule, Rfl: 0   gabapentin  (NEURONTIN ) 300 MG capsule, TAKE 1 CAPSULE BY MOUTH THREE TIMES A DAY, Disp: 90 capsule, Rfl: 3   lidocaine -prilocaine  (EMLA ) cream,  Apply small amount of cream to port site 1-2 hour prior to chemo treatment., Disp: 30 g, Rfl: 6   loperamide  (IMODIUM ) 2 MG capsule, TAKE 1 CAP BY MOUTH SEE ADMIN INSTRUCTIONS. INITIAL: 4 MG, FOLLOWED BY 2 MG AFTER EACH LOOSE STOOL MAXIMUM: 16 MG/DAY, Disp: 60 capsule, Rfl: 0   loratadine  (CLARITIN ) 10 MG tablet, Take 1 tablet (10 mg total) by mouth See admin instructions. Take 1 tablet daily for 4 days after each chemotherapy treatments., Disp: 90 tablet, Rfl: 0   magic mouthwash w/lidocaine  SOLN, Take 5 mLs by mouth 4 (four) times daily as needed for mouth pain. Sig: Swish/Swallow 5-10 ml four times a day as needed. Dispense 480 ml. 1RF, Disp: 480 mL, Rfl: 1   meclizine  (ANTIVERT ) 12.5 MG tablet, Take 1 tablet (12.5 mg total) by mouth 3 (three) times daily as needed for dizziness., Disp: 30  tablet, Rfl: 0   methocarbamol  (ROBAXIN ) 500 MG tablet, TAKE 1 TABLET TWICE DAILY AS NEEDED FOR MUSCLE SPASM(S), Disp: 180 tablet, Rfl: 0   ondansetron  (ZOFRAN ) 8 MG tablet, Take 1 tablet (8 mg total) by mouth 2 (two) times daily as needed (Nausea or vomiting)., Disp: 30 tablet, Rfl: 1   polyethylene glycol powder (GLYCOLAX /MIRALAX ) 17 GM/SCOOP powder, Take 17 g by mouth daily., Disp: 3350 g, Rfl: 1   potassium chloride  SA (KLOR-CON  M) 20 MEQ tablet, TAKE 1 TABLET EVERY DAY, Disp: 90 tablet, Rfl: 3   triamterene -hydrochlorothiazide  (MAXZIDE -25) 37.5-25 MG tablet, TAKE 1 TABLET EVERY DAY, Disp: 90 tablet, Rfl: 3   ALPRAZolam  (XANAX ) 0.5 MG tablet, TAKE 1 TABLET BY MOUTH TWICE A DAY AS NEEDED FOR ANXIETY 10.11.23, Disp: 60 tablet, Rfl: 3 No current facility-administered medications for this visit.  Facility-Administered Medications Ordered in Other Visits:    heparin  lock flush 100 UNIT/ML injection, , , ,    palonosetron  (ALOXI ) 0.25 MG/5ML injection, , , ,    prochlorperazine  (COMPAZINE ) 10 MG tablet, , , ,   Review of Systems:  Negative unless indicated in HPI.   Physical Exam: Vitals:   02/02/24 1424  BP: 110/70  Pulse: 62  Temp: 98.4 F (36.9 C)  TempSrc: Oral  SpO2: 98%  Weight: 136 lb 12.8 oz (62.1 kg)  Height: 4\' 10"  (1.473 m)    Body mass index is 28.59 kg/m.   Subsequent Medicare wellness visit   1. Risk factors, based on past  M,S,F - Cardiac Risk Factors include: advanced age (>40men, >65 women)   2.  Physical activities: Dietary issues and exercise activities discussed:      3.  Depression/mood:  Flowsheet Row Clinical Support from 02/02/2024 in [facility] Total Score 7        4.  ADL's:    02/02/2024    2:14 PM  In your present state of health, do you have any difficulty performing the following activities:  Hearing? 1  Vision? 1  Difficulty concentrating or making decisions? 1  Dressing or bathing? 0  Doing  errands, shopping? 1  Preparing Food and eating ? N  Using the Toilet? N  In the past six months, have you accidently leaked urine? Y  Do you have problems with loss of bowel control? N  Managing your Medications? N  Managing your Finances? N  Housekeeping or managing your Housekeeping? N     5.  Fall risk:     09/21/2022    9:40 AM 10/06/2022    9:05 AM 10/16/2022  10:05 AM 01/04/2024    2:45 PM 02/02/2024    2:17 PM  Fall Risk  Falls in the past year?    1 1  Was there an injury with Fall?    1 0  Fall Risk Category Calculator    2 1  (RETIRED) Patient Fall Risk Level High fall risk Low fall risk High fall risk    Fall risk Follow up    Falls evaluation completed Falls evaluation completed     6.  Home safety: No problems identified   7.  Height weight, and visual acuity: height and weight as above, vision/hearing: Vision Screening   Right eye Left eye Both eyes  Without correction 20/30 20/30 20/30   With correction        8.  Counseling: Counseling given: Not Answered    9. Lab orders based on risk factors: Laboratory update will be reviewed   10. Cognitive assessment:        02/02/2024    2:17 PM 08/13/2020   10:51 AM  6CIT Screen  What Year? 0 points 4 points   What month? 0 points 0 points  What time? 0 points 0 points  Count back from 20 0 points 0 points  Months in reverse 0 points 4 points  Repeat phrase 0 points 0 points  Total Score 0 points 8 points     Significant value     11. Screening: Patient provided with a written and personalized 5-10 year screening schedule in the AVS. Health Maintenance  Topic Date Due   DTaP/Tdap/Td vaccine (1 - Tdap) Never done   Zoster (Shingles) Vaccine (1 of 2) Never done   COVID-19 Vaccine (8 - 2024-25 season) 06/06/2023   Flu Shot  05/05/2024   Medicare Annual Wellness Visit  02/01/2025   Pneumonia Vaccine  Completed   DEXA scan (bone density measurement)  Completed   Hepatitis C Screening  Completed   HPV  Vaccine  Aged Out   Meningitis B Vaccine  Aged Out   Colon Cancer Screening  Discontinued    12. Provider List Update: Patient Care Team    Relationship Specialty Notifications Start End  Zilphia Hilt, Charyl Coppersmith, MD PCP - General Internal Medicine  01/04/24   Rochell Chroman, RN Oncology Nurse Navigator   03/27/21   Timmy Forbes, MD Consulting Physician Oncology  10/31/21      13. Advance Directives: Does Patient Have a Medical Advance Directive?: Yes Does patient want to make changes to medical advance directive?: No - Patient declined  14. Opioids: Patient is not on any opioid prescriptions and has no risk factors for a substance use disorder.   15.   Goals      Home and Family Safety Maintained     Evidence-based guidance:  Identify and review with patient potential safety risks from home or living environment, automobile driving and/or riding, as well as firearms.   Provide anticipatory guidance related to specific risks to safety; brainstorm acceptable strategies to reduce risk.  Identify resources needed to improve or maintain safety.  Provide emergency services contact information; encourage placement of contact information in easy to locate place at home and in wallet, purse or backpack.   Notes:          I have personally reviewed and noted the following in the patient's chart:   Medical and social history Use of alcohol, tobacco or illicit drugs  Current medications and supplements Functional ability and status Nutritional status Physical activity  Advanced directives List of other physicians Hospitalizations, surgeries, and ER visits in previous 12 months Vitals Screenings to include cognitive, depression, and falls Referrals and appointments  In addition, I have reviewed and discussed with patient certain preventive protocols, quality metrics, and best practice recommendations. A written personalized care plan for preventive services as well as general  preventive health recommendations were provided to patient.   Impression and Plan:  Medicare annual wellness visit, subsequent  Essential hypertension -     CBC with Differential/Platelet; Future -     Comprehensive metabolic panel with GFR; Future  Pure hypercholesterolemia -     Lipid panel; Future  Hypokalemia  GAD (generalized anxiety disorder) -     ALPRAZolam ; TAKE 1 TABLET BY MOUTH TWICE A DAY AS NEEDED FOR ANXIETY 10.11.23  Dispense: 60 tablet; Refill: 3   -Recommend routine eye and dental care. -Healthy lifestyle discussed in detail. -Labs to be updated today. -Prostate cancer screening: N/A Health Maintenance  Topic Date Due   DTaP/Tdap/Td vaccine (1 - Tdap) Never done   Zoster (Shingles) Vaccine (1 of 2) Never done   COVID-19 Vaccine (8 - 2024-25 season) 06/06/2023   Flu Shot  05/05/2024   Medicare Annual Wellness Visit  02/01/2025   Pneumonia Vaccine  Completed   DEXA scan (bone density measurement)  Completed   Hepatitis C Screening  Completed   HPV Vaccine  Aged Out   Meningitis B Vaccine  Aged Out   Colon Cancer Screening  Discontinued     - Will update Tdap and shingles vaccinations at pharmacy.     Marguerita Shih, MD [facility]

## 2024-02-03 ENCOUNTER — Encounter: Payer: Self-pay | Admitting: Internal Medicine

## 2024-02-03 ENCOUNTER — Encounter: Payer: Self-pay | Admitting: Oncology

## 2024-02-03 LAB — CBC WITH DIFFERENTIAL/PLATELET
Basophils Absolute: 0 10*3/uL (ref 0.0–0.1)
Basophils Relative: 0.5 % (ref 0.0–3.0)
Eosinophils Absolute: 0.1 10*3/uL (ref 0.0–0.7)
Eosinophils Relative: 3.2 % (ref 0.0–5.0)
HCT: 34.2 % — ABNORMAL LOW (ref 36.0–46.0)
Hemoglobin: 11.1 g/dL — ABNORMAL LOW (ref 12.0–15.0)
Lymphocytes Relative: 42.1 % (ref 12.0–46.0)
Lymphs Abs: 1.5 10*3/uL (ref 0.7–4.0)
MCHC: 32.3 g/dL (ref 30.0–36.0)
MCV: 95.7 fl (ref 78.0–100.0)
Monocytes Absolute: 0.5 10*3/uL (ref 0.1–1.0)
Monocytes Relative: 12.5 % — ABNORMAL HIGH (ref 3.0–12.0)
Neutro Abs: 1.5 10*3/uL (ref 1.4–7.7)
Neutrophils Relative %: 41.7 % — ABNORMAL LOW (ref 43.0–77.0)
Platelets: 152 10*3/uL (ref 150.0–400.0)
RBC: 3.58 Mil/uL — ABNORMAL LOW (ref 3.87–5.11)
RDW: 14.2 % (ref 11.5–15.5)
WBC: 3.6 10*3/uL — ABNORMAL LOW (ref 4.0–10.5)

## 2024-02-03 LAB — COMPREHENSIVE METABOLIC PANEL WITH GFR
ALT: 24 U/L (ref 0–35)
AST: 29 U/L (ref 0–37)
Albumin: 3.8 g/dL (ref 3.5–5.2)
Alkaline Phosphatase: 102 U/L (ref 39–117)
BUN: 26 mg/dL — ABNORMAL HIGH (ref 6–23)
CO2: 29 meq/L (ref 19–32)
Calcium: 9.5 mg/dL (ref 8.4–10.5)
Chloride: 102 meq/L (ref 96–112)
Creatinine, Ser: 0.91 mg/dL (ref 0.40–1.20)
GFR: 60.34 mL/min (ref >60.00)
Glucose, Bld: 74 mg/dL (ref 70–99)
Potassium: 3.9 meq/L (ref 3.5–5.1)
Sodium: 137 meq/L (ref 135–145)
Total Bilirubin: 0.4 mg/dL (ref 0.2–1.2)
Total Protein: 7.2 g/dL (ref 6.0–8.3)

## 2024-02-03 LAB — LIPID PANEL
Cholesterol: 216 mg/dL — ABNORMAL HIGH (ref 0–200)
HDL: 72.2 mg/dL (ref >39.00)
LDL Cholesterol: 135 mg/dL — ABNORMAL HIGH (ref 0–99)
NonHDL: 143.99
Total CHOL/HDL Ratio: 3
Triglycerides: 47 mg/dL (ref 0.0–149.0)
VLDL: 9.4 mg/dL (ref 0.0–40.0)

## 2024-02-04 ENCOUNTER — Other Ambulatory Visit: Payer: Self-pay

## 2024-02-04 NOTE — Assessment & Plan Note (Signed)
 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  -Treatment course: began first line systemic chemotherapy with 5FU/leuc and bevacizumab  on 10/20/21. Restaging scans showed progression and Oxaliplatin  was added with cycle 7 on 01/21/22. She continued FOLFOX/Beva x12 cycles, oxali was removed with cycle 18 on 09/07/22. She continued 5FU/Beva for 4 additional cycles. She completed cycle 22 on 11/20/22 prior to moving to [place]. At [facility] she received additional cycles of 5FU/Beva through November 2024.  -CT 01/25/2024 showed disease progression in liver, and 2 new lung nodule, up to 1.1 cm, highly concerning for metastasis.

## 2024-02-07 ENCOUNTER — Encounter: Payer: Self-pay | Admitting: Hematology

## 2024-02-07 ENCOUNTER — Inpatient Hospital Stay: Payer: Medicare (Managed Care) | Admitting: General Practice

## 2024-02-07 ENCOUNTER — Inpatient Hospital Stay (HOSPITAL_BASED_OUTPATIENT_CLINIC_OR_DEPARTMENT_OTHER): Payer: Medicare (Managed Care) | Admitting: Hematology

## 2024-02-07 ENCOUNTER — Inpatient Hospital Stay: Payer: Medicare (Managed Care) | Attending: Nurse Practitioner

## 2024-02-07 ENCOUNTER — Ambulatory Visit: Payer: Medicare (Managed Care) | Admitting: Audiologist

## 2024-02-07 ENCOUNTER — Other Ambulatory Visit: Payer: Self-pay

## 2024-02-07 VITALS — BP 164/78 | HR 61 | Temp 97.7°F | Resp 21 | Ht <= 58 in | Wt 139.2 lb

## 2024-02-07 DIAGNOSIS — C189 Malignant neoplasm of colon, unspecified: Secondary | ICD-10-CM | POA: Diagnosis not present

## 2024-02-07 DIAGNOSIS — C787 Secondary malignant neoplasm of liver and intrahepatic bile duct: Secondary | ICD-10-CM | POA: Insufficient documentation

## 2024-02-07 DIAGNOSIS — C180 Malignant neoplasm of cecum: Secondary | ICD-10-CM | POA: Insufficient documentation

## 2024-02-07 DIAGNOSIS — D702 Other drug-induced agranulocytosis: Secondary | ICD-10-CM

## 2024-02-07 LAB — CBC WITH DIFFERENTIAL (CANCER CENTER ONLY)
Abs Immature Granulocytes: 0 10*3/uL (ref 0.00–0.07)
Basophils Absolute: 0 10*3/uL (ref 0.0–0.1)
Basophils Relative: 0 %
Eosinophils Absolute: 0.1 10*3/uL (ref 0.0–0.5)
Eosinophils Relative: 2 %
HCT: 34 % — ABNORMAL LOW (ref 36.0–46.0)
Hemoglobin: 11.1 g/dL — ABNORMAL LOW (ref 12.0–15.0)
Immature Granulocytes: 0 %
Lymphocytes Relative: 40 %
Lymphs Abs: 1.6 10*3/uL (ref 0.7–4.0)
MCH: 30.7 pg (ref 26.0–34.0)
MCHC: 32.6 g/dL (ref 30.0–36.0)
MCV: 94.2 fL (ref 80.0–100.0)
Monocytes Absolute: 0.4 10*3/uL (ref 0.1–1.0)
Monocytes Relative: 10 %
Neutro Abs: 1.9 10*3/uL (ref 1.7–7.7)
Neutrophils Relative %: 48 %
Platelet Count: 137 10*3/uL — ABNORMAL LOW (ref 150–400)
RBC: 3.61 MIL/uL — ABNORMAL LOW (ref 3.87–5.11)
RDW: 13.5 % (ref 11.5–15.5)
Smear Review: NORMAL
WBC Count: 3.9 10*3/uL — ABNORMAL LOW (ref 4.0–10.5)
nRBC: 0 % (ref 0.0–0.2)

## 2024-02-07 LAB — CMP (CANCER CENTER ONLY)
ALT: 36 U/L (ref 0–44)
AST: 33 U/L (ref 15–41)
Albumin: 3.9 g/dL (ref 3.5–5.0)
Alkaline Phosphatase: 118 U/L (ref 38–126)
Anion gap: 3 — ABNORMAL LOW (ref 5–15)
BUN: 17 mg/dL (ref 8–23)
CO2: 33 mmol/L — ABNORMAL HIGH (ref 22–32)
Calcium: 9.3 mg/dL (ref 8.9–10.3)
Chloride: 104 mmol/L (ref 98–111)
Creatinine: 0.69 mg/dL (ref 0.44–1.00)
GFR, Estimated: >60 mL/min (ref >60)
Glucose, Bld: 89 mg/dL (ref 70–99)
Potassium: 3.8 mmol/L (ref 3.5–5.1)
Sodium: 140 mmol/L (ref 135–145)
Total Bilirubin: 0.4 mg/dL (ref 0.0–1.2)
Total Protein: 7.3 g/dL (ref 6.5–8.1)

## 2024-02-07 MED ORDER — HEPARIN SOD (PORK) LOCK FLUSH 100 UNIT/ML IV SOLN
250.0000 [IU] | Freq: Once | INTRAVENOUS | Status: AC
  Administered 2024-02-07: 250 [IU]

## 2024-02-07 MED ORDER — LIDOCAINE-PRILOCAINE 2.5-2.5 % EX CREA: TOPICAL_CREAM | CUTANEOUS | 6 refills | Status: DC

## 2024-02-07 MED ORDER — SODIUM CHLORIDE 0.9% FLUSH
10.0000 mL | Freq: Once | INTRAVENOUS | Status: AC
  Administered 2024-02-07: 10 mL

## 2024-02-07 NOTE — Progress Notes (Signed)
 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  -Treatment course: began first line systemic chemotherapy with 5FU/leuc and bevacizumab  on 10/20/21. Restaging scans showed progression and Oxaliplatin  was added with cycle 7 on 01/21/22. She continued FOLFOX/Beva x12 cycles, oxali was removed with cycle 18 on 09/07/22. She continued 5FU/Beva for 4 additional cycles. She completed cycle 22 on 11/20/22 prior to moving to [place]. At [facility] she received additional cycles of 5FU/Beva through November 2024.  -CT 01/25/2024 showed disease progression in liver, and 2 new lung nodule, up to 1.1 cm, highly concerning for metastasis. Assessment & Plan Metastatic colon cancer Metastatic colon cancer with progression in the liver and new pulmonary nodules. The primary colonic tumor remains static. Disease progression occurred due to the absence of treatment since late last year. She reports improved well-being off chemotherapy, likely due to the absence of treatment-related side effects. The main concern is hepatic progression and new pulmonary involvement. Liver-targeted therapy alone offers limited benefit due to multi-site metastatic disease. Oral chemotherapy with capecitabine was discussed, which may have similar side effects to previous 5-fu infusion treatments but is generally better tolerated. Emphasized balancing treatment with  quality of life and discussed potential benefits and side effects of capecitabine, including fatigue, mild nausea, diarrhea, and skin toxicity. Considered bevacizumab  in conjunction with capecitabine to enhance efficacy. Without treatment, life expectancy is approximately 8-12 months; treatment may extend it to 2-2.5 years, considering her KRAS mutation. She is considering treatment options to maintain quality of life. - Discuss treatment options with family to decide on next steps. -I recommend initiating capecitabine (Xeloda) for systemic treatment. - Consider adding bevacizumab  to capecitabine for enhanced efficacy. - Prescribe lidocaine  cream. - Patient is not a candidate for intensive chemotherapy due to her advanced age, strong preference of quality of life, but single agent such as irinotecan or Lonsurf could be considered in the future.  Goals of Care She prioritizes maintaining quality of life and is considering treatment options that align with this goal. She understands the prognosis and potential outcomes of treatment versus no treatment. Acknowledges the cancer is not curable but treatable, focusing on enjoying life while managing the disease. - Discuss treatment preferences with family. - Consider treatment options that align with her goals of maintaining quality of life.  Plan - I reviewed her restaging CT scan images with patient and her daughter in detail - Due to the multiorgan metastasis, I recommend systemic treatment capecitabine 1 week on and 1 week off, with bevacizumab  every 2 weeks.  Patient and her daughter want to think about, and let us  know her decision later this week.   SUMMARY OF ONCOLOGIC HISTORY: Oncology History  Metastatic colon cancer to liver (HCC)  08/21/2021 Imaging   CT abdomen pelvis with contrast  showed soft tissue mass at the base of cecum measuring up to 5 cm, slightly increased in size.  Interval development of multiple low-density lesions  throughout the liver highly  suggestive for metastatic disease.  Complex mass in the region of the vaginal cuff measuring up to 5.2 cm, increased in size.  Right ovary also appears more prominent on the current examination.  Moderate large volume of stool throughout the colon.  Mild urinary bladder wall thickening.  Colonic diverticulosis without evidence of active diverticulitis.  Aortic atherosclerosis   09/04/2021 Procedure   patient is status post left lobe liver lesion biopsy and pathology showed metastatic adenocarcinoma, compatible with colorectal primary.   09/04/2021 Cancer Staging   Staging form: Colon and Rectum, AJCC 8th Edition - Clinical stage from 09/04/2021: Stage Unknown (cTX, cNX, cM1) - Signed by Timmy Forbes, MD on 05/13/2022 Stage prefix: Initial diagnosis   09/16/2021 Initial Diagnosis   Metastatic colon cancer to liver [facility]) [facility] one liquid biopsy testing showed KRASG12V, APC TP53 TMB 4  Patient initially went to emergency room on 03/24/2021 for abdominal pain. 03/24/2021, CT scan of the abdomen showed marked bladder wall thickening with surrounding soft tissue stranding is noted.  Concerning for cystitis.  There is a lobulated mass between the posterior wall of the bladder and the rectum which appears to arise from the vaginal cuff.  This is indeterminate and difficult to characterize reflecting lack of IV contrast material.  Nonobstructing left renal calculus, left sacroiliitis, lumbar spondylosis aortic atherosclerosis. 03/24/2021 CT pelvic showed abdominal T[place]q soft tissue of right cecum, concerning for colon carcinoma.  Colonoscopy recommended.  Low-attenuation mass in the region of vaginal cuff is again noted.  Diffuse urinary bladder wall thickening and mild bilateral hydroureter possibly cystitis.   US  03/24/2021 Lobulated solid 3.8 cm mass confirmed by ultrasound. This is most compatible with a neoplasm but remains indeterminate as to the origin as the epicenter seems  to be outside of both the vaginal cuff and the adjacent colon, while the lesion appears inseparable from both. Extensive echogenic debris within the distended urinary bladder.   03/26/2021, patient was seen by Dr. Randalyn Bushman for evaluation of colonic/vaginal cuff pelvic mass.  Patient also was referred to gastroenterology for colonoscopy.   03/26/2021 CEA 3.4.  CA125 9.7 04/04/2021, status post colonoscopy which showed a frond like /villous nonobstructing large mass was found in the cecum, this was biopsied.  Terminal ileum was briefly intubated and appeared normal.  Diverticulosis in the sigmoid colon.  The rectum, sigmoid colon, descending colon, transverse colon and ascending colon were normal.  Nonbleeding internal hemorrhoids. 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 Given that the biopsy may not be representative of the entire underlying lesion.  Patient was recommended to establish with surgeon for evaluation. Patient no showed for her surgery appointment and as well as her follow-up appointment with gastroenterology.  Per daughter, patient was having cervical spine surgery and she prioritized that over the colon surgery.   10/20/2021 - 01/07/2022 Chemotherapy    COLORECTAL 5-FU  + Bevacizumab  q14d      10/20/2021 -  Chemotherapy   Patient is on Treatment Plan : COLORECTAL 5FU + Leucovorin  (Modified DeGramont) + Bevacizumab  q14d     12/22/2021 Imaging   CT chest abdomen pelvis Decreased cecum mass, however increased size and size of metastatic lesions throughout the liver.  Interval enlargement of vaginal cuff mass. No mets in chest.    01/21/2022 -  Chemotherapy   COLORECTAL FOLFOX  + Bevacizumab  q14d    patient was seen by gynecology oncology Dr. Marella Shams.  Biopsy of the vaginal mass was not recommended.Patient's case was also discussed on multidisciplinary tumor board.  IR potentially can try a biopsy if patient is willing to.I had a discussion with patient's daughter  over the phone prior to this visit.  We discussed about option of adding oxaliplatin  to 5-FU/bevacizumab  and continue treatment of colon cancer, repeat CT scan short-term and if progression, repeat biopsy versus proceeding with vaginal mass biopsy.  Daughter prefers proceeding with chemotherapy and defer biopsy.   01/08/2022 patient has had a second opinion at [facility] and was seen by Dr. Cyrena Drilling .  Dr. Cyrena Drilling agrees with the current treatment plan with FOLFOX/bevacizumab .  He has ordered guardant 360 to see if she may be eligible for clinical trial in the future.  -   04/13/2022 Imaging    CT chest abdomen pelvis showed a cecal mass with stable hepatic metastasis.  Mixed cystic and solid mass in the region of the vaginal cuff, new area of patchy groundglass and consolidation in the right upper lobe, lingula and left lower lobe.  Likely due to pneumonia.  Hepatic steatosis.  Bilateral renal stones.  Aortic atherosclerosis   06/24/2022 Imaging   CT abdomen pelvis with contrast showed There are low-density lesions in liver consistent with metastatic disease with no significant change. Wall thickening in the cecum appears less prominent. There is 5.5 x 4.2 cm mixed density lesion in vaginal cuff suggesting possible metastatic disease.   There is no evidence of intestinal obstruction or pneumoperitoneum.There is no hydronephrosis.   Small patchy infiltrates are seen in the lower lung fields suggesting atelectasis/pneumonia.   Small bilateral renal stones. Diverticulosis of colon without signs of diverticulitis. 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 There is encroachment of neural foramina at L4-L5 and L5-S1 levels.     06/25/2022 Imaging   CT soft tissue neck with contrast showed1No acute process in the neck to explain the patient's difficulty swallowing.  CT chest contrast showed 1. Patchy areas of airspace opacity in the right upper lobe and left lower lobe and lingula  slightly progressed since the prior radiograph and may represent worsening or recurrent pneumonia. Clinical correlation and follow-up to resolution recommended. 2. Aortic Atherosclerosis    10/14/2022 Imaging   CT chest abdomen pelvis with contrast showed Stable small hypovascular liver metastases.  Stable mass in pelvic cul-de-sac involving the vaginal cuff,consistent with metastatic disease.  No new or progressive metastatic disease within the chest, abdomen,or pelvis. Stable bilateral bilateral airspace disease, suspicious for pneumonia or inflammatory etiology.      Discussed the use of AI scribe software for clinical note transcription with the patient, who gave verbal consent to proceed.  History of Present Illness Chelsea [name] is a 78 year old female with breast cancer who presents for follow-up. She is accompanied by her daughter.  Recent imaging from January 25, 2024, shows progression of her breast cancer with multiple liver lesions and new lung nodules. The liver lesions have increased in size, with one growing from 2.8 cm to 3.7 cm. The primary tumor in the colon remains with some asymmetric nodule thickening in the sigmoid colon, but without obstruction or bleeding.  She has not received any treatment since late last year and is currently not on chemotherapy. She has not tried oral chemotherapy pills. Previous chemotherapy with 5-FU pump infusions was poorly tolerated, causing black discoloration of her tongue and hands, mouth sores, and loss of appetite, leading to significant weight loss.  She feels better since stopping chemotherapy and reports no current symptoms of fatigue or other cancer-related symptoms.  All other systems were reviewed with the patient and are negative.  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)     Moderate mitral insufficiency    Port-A-Cath in place    Seizures [facility])    Spinal stenosis    Ulcer    Urinary incontinence     SURGICAL HISTORY: Past Surgical History:  Procedure Laterality Date   ABDOMINAL HYSTERECTOMY     BACK SURGERY     due to polio   BREAST BIOPSY Bilateral    neg   BREAST BIOPSY Right 2011   neg/stereo   CARPAL TUNNEL RELEASE     CATARACT EXTRACTION Right 2020   COLONOSCOPY WITH PROPOFOL  N/A 04/04/2021   Procedure: COLONOSCOPY WITH PROPOFOL ;  Surgeon: Irby Mannan, MD;  Location: [facility] ENDOSCOPY;  Service: Endoscopy;  Laterality: N/A;   ESOPHAGOGASTRODUODENOSCOPY (EGD) WITH PROPOFOL  N/A 06/25/2022   Procedure: ESOPHAGOGASTRODUODENOSCOPY (EGD) WITH PROPOFOL ;  Surgeon: Selena Daily, MD;  Location: [facility] ENDOSCOPY;  Service: Gastroenterology;  Laterality: N/A;   EYE SURGERY     IR IMAGING GUIDED PORT INSERTION  10/09/2021    I have reviewed the social history and family history with the patient and they are unchanged from previous note.  ALLERGIES:  is allergic to penicillins.  MEDICATIONS:  Current Outpatient Medications  Medication Sig Dispense Refill   acetaminophen  (TYLENOL ) 500 MG tablet Take 500 mg by mouth every 6 (six) hours as needed.     ALPRAZolam  (XANAX ) 0.5 MG tablet TAKE 1 TABLET BY MOUTH TWICE A DAY AS NEEDED FOR ANXIETY 10.11.23 60 tablet 3   aspirin  EC 81 MG tablet Take 1 tablet (81 mg total) by mouth daily. Swallow whole. 30 tablet 12   atorvastatin  (LIPITOR) 10 MG tablet TAKE 1 TABLET BY MOUTH EVERY DAY 90 tablet 0   busPIRone  (BUSPAR ) 5 MG tablet TAKE 1 TABLET BY MOUTH THREE TIMES A DAY 270 tablet 0   Cyanocobalamin  (VITAMIN B 12 PO) Take 500 mcg by mouth daily.     diclofenac  Sodium (VOLTAREN ) 1 % GEL Apply 4 g topically 4 (four) times daily. 4 g 0   docusate sodium  (COLACE) 100 MG capsule Take 1 capsule (100 mg total) by mouth 2 (two) times daily. 60 capsule 0   FLUoxetine  (PROZAC ) 20 MG capsule TAKE 3 CAPSULES (60 MG  TOTAL) BY MOUTH EVERY MORNING. 270 capsule 0   gabapentin  (NEURONTIN ) 300 MG capsule TAKE 1 CAPSULE BY MOUTH THREE TIMES A DAY 90 capsule 3   loperamide  (IMODIUM ) 2 MG capsule TAKE 1 CAP BY MOUTH SEE ADMIN INSTRUCTIONS. INITIAL: 4 MG, FOLLOWED BY 2 MG AFTER EACH LOOSE STOOL MAXIMUM: 16 MG/DAY 60 capsule 0   loratadine  (CLARITIN ) 10 MG tablet Take 1 tablet (10 mg total) by mouth See admin instructions. Take 1 tablet daily for 4 days after each chemotherapy treatments. 90 tablet 0   magic mouthwash w/lidocaine  SOLN Take 5 mLs by mouth 4 (four) times daily as needed for mouth pain. Sig: Swish/Swallow 5-10 ml four times a day as needed. Dispense 480 ml. 1RF 480 mL 1   meclizine  (ANTIVERT ) 12.5 MG tablet Take 1 tablet (12.5 mg total) by mouth 3 (three) times daily as needed for dizziness. 30 tablet 0   methocarbamol  (ROBAXIN ) 500 MG tablet TAKE 1 TABLET TWICE DAILY AS NEEDED  FOR MUSCLE SPASM(S) 180 tablet 0   ondansetron  (ZOFRAN ) 8 MG tablet Take 1 tablet (8 mg total) by mouth 2 (two) times daily as needed (Nausea or vomiting). 30 tablet 1   polyethylene glycol powder (GLYCOLAX /MIRALAX ) 17 GM/SCOOP powder Take 17 g by mouth daily. 3350 g 1   potassium chloride  SA (KLOR-CON  M) 20 MEQ tablet TAKE 1 TABLET EVERY DAY 90 tablet 3   triamterene -hydrochlorothiazide  (MAXZIDE -25) 37.5-25 MG tablet TAKE 1 TABLET EVERY DAY 90 tablet 3   lidocaine -prilocaine  (EMLA ) cream Apply small amount of cream to port site 1-2 hour prior to chemo treatment. 30 g 6   No current facility-administered medications for this visit.   Facility-Administered Medications Ordered in Other Visits  Medication Dose Route Frequency Provider Last Rate Last Admin   heparin  lock flush 100 UNIT/ML injection            palonosetron  (ALOXI ) 0.25 MG/5ML injection            prochlorperazine  (COMPAZINE ) 10 MG tablet             PHYSICAL EXAMINATION: ECOG PERFORMANCE STATUS: 1 - Symptomatic but completely ambulatory  Vitals:   02/07/24 1551  02/07/24 1553  BP: (!) 162/80 (!) 164/78  Pulse: 61   Resp: (!) 21   Temp: 97.7 F (36.5 C)   SpO2: 93%    Wt Readings from Last 3 Encounters:  02/07/24 139 lb 3.2 oz (63.1 kg)  02/02/24 136 lb 12.8 oz (62.1 kg)  01/11/24 133 lb 4.8 oz (60.5 kg)     GENERAL:alert, no distress and comfortable SKIN: skin color, texture, turgor are normal, no rashes or significant lesions EYES: normal, Conjunctiva are pink and non-injected, sclera clear NECK: supple, thyroid normal size, non-tender, without nodularity LYMPH:  no palpable lymphadenopathy in the cervical, axillary  LUNGS: clear to auscultation and percussion with normal breathing effort HEART: regular rate & rhythm and no murmurs and no lower extremity edema ABDOMEN:abdomen soft, non-tender and normal bowel sounds Musculoskeletal:no cyanosis of digits and no clubbing  NEURO: alert & oriented x 3 with fluent speech, no focal motor/sensory deficits  Physical Exam    LABORATORY DATA:  I have reviewed the data as listed    Latest Ref Rng & Units 02/07/2024    3:18 PM 02/02/2024    3:07 PM 11/20/2022    8:28 AM  CBC  WBC 4.0 - 10.5 K/uL 3.9  3.6  3.5   Hemoglobin 12.0 - 15.0 g/dL 16.1  09.6  04.5   Hematocrit 36.0 - 46.0 % 34.0  34.2  33.6   Platelets 150 - 400 K/uL 137  152.0  189         Latest Ref Rng & Units 02/07/2024    3:18 PM 02/02/2024    3:07 PM 11/20/2022    8:28 AM  CMP  Glucose 70 - 99 mg/dL 89  74  92   BUN 8 - 23 mg/dL 17  26  14    Creatinine 0.44 - 1.00 mg/dL 4.09  8.11  9.14   Sodium 135 - 145 mmol/L 140  137  137   Potassium 3.5 - 5.1 mmol/L 3.8  3.9  3.6   Chloride 98 - 111 mmol/L 104  102  102   CO2 22 - 32 mmol/L 33  29  29   Calcium  8.9 - 10.3 mg/dL 9.3  9.5  9.0   Total Protein 6.5 - 8.1 g/dL 7.3  7.2  6.8   Total Bilirubin 0.0 -  1.2 mg/dL 0.4  0.4  0.4   Alkaline Phos 38 - 126 U/L 118  102  74   AST 15 - 41 U/L 33  29  20   ALT 0 - 44 U/L 36  24  11       RADIOGRAPHIC STUDIES: I have  personally reviewed the radiological images as listed and agreed with the findings in the report. No results found.    No orders of the defined types were placed in this encounter.  All questions were answered. The patient knows to call the clinic with any problems, questions or concerns. No barriers to learning was detected. The total time spent in the appointment was 40 minutes.     Sonja [place], MD 02/07/2024

## 2024-02-07 NOTE — Progress Notes (Signed)
 As per Lacie Burton NP, order was placed in portal successfully and paperwork was attached to order. Paperwork was given to lab to be drawn 5/5.

## 2024-02-08 LAB — CEA (ACCESS): CEA (CHCC): 11.04 ng/mL — ABNORMAL HIGH (ref 0.00–5.00)

## 2024-02-13 ENCOUNTER — Other Ambulatory Visit: Payer: Self-pay | Admitting: Internal Medicine

## 2024-02-13 DIAGNOSIS — F411 Generalized anxiety disorder: Secondary | ICD-10-CM

## 2024-02-14 ENCOUNTER — Telehealth: Payer: Self-pay | Admitting: Internal Medicine

## 2024-02-14 NOTE — Telephone Encounter (Signed)
 Copied from CRM [id number]. Topic: Clinical - Prescription Issue >> Feb 14, 2024 11:00 AM [place] E wrote: Reason for CRM: Patient called in regarding her ALPRAZolam  (XANAX ) 0.5 MG tablet, stated she is due for a refill soon, but the pharmacy gave her a call and denied a refill when she usually gets a 30-day supply. Pharmacy did not stated the reasoning as to why they could not fill this medication. Home phone number on file is the best for a callback.

## 2024-02-14 NOTE — Telephone Encounter (Signed)
 Spoke to the pharmacist and the last time the prescription was filled was 01/18/24.  The next refill is due 02/16/24.  Patient is aware.

## 2024-02-17 ENCOUNTER — Encounter: Payer: Self-pay | Admitting: Hematology

## 2024-02-17 LAB — GUARDANT 360

## 2024-02-23 NOTE — Progress Notes (Signed)
 PATIENT NAVIGATOR PROGRESS NOTE  Name: Chelsea [name] Date: 02/23/2024 MRN: [id number]  DOB: [birth year]   Reason for visit:  Follow-up Phone Call  Comments:   Patient was seen in office on 02/07/24 by Dr. Maryalice Smaller to discuss treatment options.  Per office note, patient stated she would like to take some time to consider her treatment options and would call office once she had made a decision. Called patient this morning to follow-up as we had not heard from her.  Patient stated she has been busy attending family events and has not yet made a final decision and needed more time to discuss her treatment options with her family.  Asked if patient would like to schedule a follow-up visit with our office and patient declined, stating she needed a few more weeks to weigh her options. Asked if I could call patient next week and patient declined, stating she needed more time as her upcoming schedule is busy with family functions.  Patient did agree for me to contact her in 2 weeks to follow-up.  Patient encouraged to contact myself or office if she had any questions or if she made her decision prior to the 2-week follow-up.  Patient verbalized understanding.  Dr. Maryalice Smaller and Lacie Burton, NP made aware.      Time spent counseling/coordinating care: 30-45 minutes

## 2024-03-08 NOTE — Progress Notes (Signed)
 PATIENT NAVIGATOR PROGRESS NOTE  Name: Chelsea [name] Date: 03/08/2024 MRN: [id number]  DOB: 12/17/1945   Reason for visit:  Follow-up Phone Call  Comments:   Contacted patient by phone to follow up on her treatment decision. Patient reported she has not yet decided and is continuing to research her options. Patient indicated a preference toward oral chemotherapy but would like additional time to consider. Patient stated she is hesitant on starting treatment due to the side effects she encountered with her previous treatment.  Informed patient that the proposed treatment regimens' side effects would be a little different, but that we would discuss these fully with her before she starts treatment. Patient agreed to schedule a follow-up appointment with our office after July 4th to further discuss treatment plans.  Dr. Maryalice Smaller and Lacie Burton, NP, have been informed of the patient's current status.  A scheduling request will be sent to arrange the patient's follow-up appointment.  The patient was encouraged to reach out to the office with any questions or concerns before her next visit and verbalized understanding and agreement.    Time spent counseling/coordinating care: 45-60 minutes

## 2024-03-09 ENCOUNTER — Other Ambulatory Visit: Payer: Self-pay | Admitting: Internal Medicine

## 2024-03-09 MED ORDER — METHOCARBAMOL 500 MG PO TABS: 500.0000 mg | ORAL_TABLET | Freq: Two times a day (BID) | ORAL | 0 refills | Status: DC | PRN

## 2024-03-09 NOTE — Telephone Encounter (Signed)
 Copied from CRM [id number]. Topic: Clinical - Medication Refill >> Mar 09, 2024  2:11 PM [place] B wrote: Medication: methocarbamol  (ROBAXIN ) 500 MG tablet  Has the patient contacted their pharmacy? Yes (Agent: If no, request that the patient contact the pharmacy for the refill. If patient does not wish to contact the pharmacy document the reason why and proceed with request.) (Agent: If yes, when and what did the pharmacy advise?)  This is the patient's preferred pharmacy:   [id number] - [place], [place] - [address] AT [address] [place] [address] Phone: [phone] Fax: [phone]  Is this the correct pharmacy for this prescription? Yes If no, delete pharmacy and type the correct one.   Has the prescription been filled recently? No  Is the patient out of the medication? No  Has the patient been seen for an appointment in the last year OR does the patient have an upcoming appointment? Yes  Can we respond through MyChart? No  Agent: Please be advised that Rx refills may take up to 3 business days. We ask that you follow-up with your pharmacy.

## 2024-03-14 ENCOUNTER — Telehealth: Payer: Self-pay

## 2024-03-14 MED ORDER — METHOCARBAMOL 500 MG PO TABS: 500.0000 mg | ORAL_TABLET | Freq: Two times a day (BID) | ORAL | 0 refills | Status: DC | PRN

## 2024-03-14 NOTE — Telephone Encounter (Signed)
 Refill sent.

## 2024-03-14 NOTE — Addendum Note (Signed)
 Addended by: Nicolina Barrios B on: 03/14/2024 09:24 AM   Modules accepted: Orders

## 2024-03-14 NOTE — Telephone Encounter (Signed)
 Copied from CRM [id number]. Topic: Clinical - Prescription Issue >> Mar 14, 2024  8:43 AM Bambi Bonine D wrote: Reason for CRM: Pt would like to have the medication for the methocarbamol  (ROBAXIN ) 500 MG tablet [facility] [address]. Pt recently called for a medication refill and it was approved but sent to the incorrect pharmacy. Pt would like to have the resent to the correct pharmacy today if possible.

## 2024-04-10 ENCOUNTER — Other Ambulatory Visit: Payer: Self-pay

## 2024-04-10 DIAGNOSIS — C787 Secondary malignant neoplasm of liver and intrahepatic bile duct: Secondary | ICD-10-CM

## 2024-04-11 ENCOUNTER — Inpatient Hospital Stay (HOSPITAL_BASED_OUTPATIENT_CLINIC_OR_DEPARTMENT_OTHER): Payer: Medicare (Managed Care) | Admitting: Hematology

## 2024-04-11 ENCOUNTER — Telehealth: Payer: Self-pay

## 2024-04-11 ENCOUNTER — Encounter: Payer: Self-pay | Admitting: Oncology

## 2024-04-11 ENCOUNTER — Inpatient Hospital Stay: Payer: Medicare (Managed Care) | Attending: Nurse Practitioner

## 2024-04-11 ENCOUNTER — Telehealth: Payer: Self-pay | Admitting: Pharmacist

## 2024-04-11 ENCOUNTER — Other Ambulatory Visit (HOSPITAL_COMMUNITY): Payer: Self-pay

## 2024-04-11 ENCOUNTER — Encounter: Payer: Self-pay | Admitting: Hematology

## 2024-04-11 VITALS — BP 129/67 | HR 76 | Temp 98.2°F | Resp 17 | Ht <= 58 in | Wt 137.0 lb

## 2024-04-11 DIAGNOSIS — C189 Malignant neoplasm of colon, unspecified: Secondary | ICD-10-CM

## 2024-04-11 DIAGNOSIS — C787 Secondary malignant neoplasm of liver and intrahepatic bile duct: Secondary | ICD-10-CM | POA: Diagnosis not present

## 2024-04-11 DIAGNOSIS — C180 Malignant neoplasm of cecum: Secondary | ICD-10-CM | POA: Diagnosis present

## 2024-04-11 LAB — CBC WITH DIFFERENTIAL (CANCER CENTER ONLY)
Abs Immature Granulocytes: 0.01 K/uL (ref 0.00–0.07)
Basophils Absolute: 0 K/uL (ref 0.0–0.1)
Basophils Relative: 0 %
Eosinophils Absolute: 0.1 K/uL (ref 0.0–0.5)
Eosinophils Relative: 2 %
HCT: 37.5 % (ref 36.0–46.0)
Hemoglobin: 11.7 g/dL — ABNORMAL LOW (ref 12.0–15.0)
Immature Granulocytes: 0 %
Lymphocytes Relative: 38 %
Lymphs Abs: 1.5 K/uL (ref 0.7–4.0)
MCH: 29.9 pg (ref 26.0–34.0)
MCHC: 31.2 g/dL (ref 30.0–36.0)
MCV: 95.9 fL (ref 80.0–100.0)
Monocytes Absolute: 0.4 K/uL (ref 0.1–1.0)
Monocytes Relative: 11 %
Neutro Abs: 2 K/uL (ref 1.7–7.7)
Neutrophils Relative %: 49 %
Platelet Count: 148 K/uL — ABNORMAL LOW (ref 150–400)
RBC: 3.91 MIL/uL (ref 3.87–5.11)
RDW: 13.2 % (ref 11.5–15.5)
WBC Count: 4.1 K/uL (ref 4.0–10.5)
nRBC: 0 % (ref 0.0–0.2)

## 2024-04-11 LAB — CMP (CANCER CENTER ONLY)
ALT: 105 U/L — ABNORMAL HIGH (ref 0–44)
AST: 102 U/L — ABNORMAL HIGH (ref 15–41)
Albumin: 3.7 g/dL (ref 3.5–5.0)
Alkaline Phosphatase: 316 U/L — ABNORMAL HIGH (ref 38–126)
Anion gap: 4 — ABNORMAL LOW (ref 5–15)
BUN: 15 mg/dL (ref 8–23)
CO2: 35 mmol/L — ABNORMAL HIGH (ref 22–32)
Calcium: 9.5 mg/dL (ref 8.9–10.3)
Chloride: 103 mmol/L (ref 98–111)
Creatinine: 0.87 mg/dL (ref 0.44–1.00)
GFR, Estimated: >60 mL/min (ref >60)
Glucose, Bld: 94 mg/dL (ref 70–99)
Potassium: 3.7 mmol/L (ref 3.5–5.1)
Sodium: 142 mmol/L (ref 135–145)
Total Bilirubin: 0.6 mg/dL (ref 0.0–1.2)
Total Protein: 7.1 g/dL (ref 6.5–8.1)

## 2024-04-11 MED ORDER — CAPECITABINE 500 MG PO TABS: ORAL_TABLET | ORAL | 1 refills | Status: DC

## 2024-04-11 MED ORDER — LIDOCAINE-PRILOCAINE 2.5-2.5 % EX CREA
TOPICAL_CREAM | CUTANEOUS | 3 refills | Status: DC
  Filled 2024-04-11: qty 30, 30d supply, fill #0
  Filled 2024-05-13: qty 30, 30d supply, fill #1

## 2024-04-11 NOTE — Telephone Encounter (Signed)
 Oral Oncology Patient Advocate Encounter  After completing a benefits investigation, prior authorization for Capecitabine  (Xeloda ) is not required at this time through [facility].  Patient's copay is $14.70.      Charlott Hamilton,  CPhT-Adv  she/her/hers [facility]  [facility] Pharmacy Technician Patient Advocate Specialist III [facility] Phone: [phone]  Fax: [phone] [email or web address]

## 2024-04-11 NOTE — Telephone Encounter (Addendum)
 Oral Oncology Pharmacist Encounter  Received new prescription for Xeloda  (capecitabine ) for the treatment of metastatic colon cancer in conjunction with bevacizumab , planned duration until disease progression or unacceptable drug toxicity.  CBC w/ Diff and CMP from 04/11/24 assessed, patient with CrCl of 41.6 mL/min - Xeloda  dose has been dose reduced 25% accordingly. Prescription dose and frequency assessed for appropriateness.  Current medication list in Epic reviewed, DDIs with Xeloda  identified: Category C DDI between Xeloda  and Ondansetron  due to risk of Qtc prolongation with fluorouracil  products. Noted patient only taking PRN and PO route, risk higher with IV administration. No change in therapy warranted at this time.   Evaluated chart and no patient barriers to medication adherence noted.   Prescription has been e-scribed to the [facility] for benefits analysis and approval.  Oral Oncology Clinic will continue to follow for insurance authorization, copayment issues, initial counseling and start date.  Chelsea [name], PharmD, BCPS, BCOP Hematology/Oncology Clinical Pharmacist Chelsea [name] and [facility] Oral Chemotherapy Navigation Clinics [phone] 04/11/2024 2:27 PM

## 2024-04-11 NOTE — Progress Notes (Signed)
 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  -Treatment course: began first line systemic chemotherapy with 5FU/leuc and bevacizumab  on 10/20/21. Restaging scans showed progression and Oxaliplatin  was added with cycle 7 on 01/21/22. She continued FOLFOX/Beva x12 cycles, oxali was removed with cycle 18 on 09/07/22. She continued 5FU/Beva for 4 additional cycles. She completed cycle 22 on 11/20/22 prior to moving to [place]. At [facility] she received additional cycles of 5FU/Beva through November 2024.  -CT 01/25/2024 showed disease progression in liver, and 2 new lung nodule, up to 1.1 cm, highly concerning for metastasis. -I recommended Xeloda  and Beva in May 2025, she wants to think about it   Assessment & Plan Metastatic colon cancer Metastatic colon cancer with progression in the liver and lymph nodes. She has gained weight and reports feeling well since stopping chemotherapy. A recent liquid biopsy (Guardian 360) showed no targetable mutations, but clinical trials may be an option in the future if standard treatments are exhausted. - Initiate oral chemotherapy with capecitabine , starting with a reduced dose of two tablets in the morning and three in the evening for the first cycle. - Administer bevacizumab  infusion every two weeks initially, then every three weeks once stabilized. - Order CT scan to  establish a new baseline for liver and lymph node metastases. - Schedule follow-up appointments every two weeks initially to monitor response and side effects. - Provide anti-nausea medication prescription to be used as needed. - Coordinate with pharmacy for delivery of oral chemotherapy medication. - Ensure she takes oral chemotherapy with food to prevent nausea. - Consider clinical trials in the future if standard treatments are not effective.  Plan - After detailed discussion, she agreed to start Xeloda  and bevacizumab  on July 21 - I called in Xeloda  2 tablets in the morning and 3 tablets in the evening, 7 days on and 7 days off.  Dose slightly reduced due to her age.  Plan to start on July 21 - Follow-up on July 21   SUMMARY OF ONCOLOGIC HISTORY: Oncology History  Metastatic colon cancer to liver (HCC)  08/21/2021 Imaging   CT abdomen pelvis with contrast  showed soft tissue mass at the base of cecum measuring up to 5 cm, slightly increased in size.  Interval development of multiple low-density lesions throughout the liver highly suggestive for metastatic disease.  Complex mass in the region of the vaginal cuff measuring up to 5.2 cm, increased in size.  Right ovary also appears more prominent on the current examination.  Moderate large volume of stool throughout the colon.  Mild urinary bladder wall thickening.  Colonic diverticulosis without evidence of active diverticulitis.  Aortic atherosclerosis   09/04/2021 Procedure   patient is status post left lobe liver lesion biopsy and pathology showed metastatic adenocarcinoma, compatible with colorectal primary.   09/04/2021 Cancer Staging   Staging form: Colon and Rectum, AJCC 8th Edition - Clinical stage from 09/04/2021: Stage Unknown (cTX, cNX,  cM1) - Signed by [name], MD on 05/13/2022 Stage prefix: Initial diagnosis   09/16/2021 Initial Diagnosis   Metastatic colon cancer to liver [facility]) [facility] one liquid biopsy testing showed  KRASG12V, APC TP53 TMB 4  Patient initially went to emergency room on 03/24/2021 for abdominal pain. 03/24/2021, CT scan of the abdomen showed marked bladder wall thickening with surrounding soft tissue stranding is noted.  Concerning for cystitis.  There is a lobulated mass between the posterior wall of the bladder and the rectum which appears to arise from the vaginal cuff.  This is indeterminate and difficult to characterize reflecting lack of IV contrast material.  Nonobstructing left renal calculus, left sacroiliitis, lumbar spondylosis aortic atherosclerosis. 03/24/2021 CT pelvic showed abdominal T[place]entriq soft tissue of right cecum, concerning for colon carcinoma.  Colonoscopy recommended.  Low-attenuation mass in the region of vaginal cuff is again noted.  Diffuse urinary bladder wall thickening and mild bilateral hydroureter possibly cystitis.   US  03/24/2021 Lobulated solid 3.8 cm mass confirmed by ultrasound. This is most compatible with a neoplasm but remains indeterminate as to the origin as the epicenter seems to be outside of both the vaginal cuff and the adjacent colon, while the lesion appears inseparable from both. Extensive echogenic debris within the distended urinary bladder.   03/26/2021, patient was seen by Dr. Elby for evaluation of colonic/vaginal cuff pelvic mass.  Patient also was referred to gastroenterology for colonoscopy.   03/26/2021 CEA 3.4.  CA125 9.7 04/04/2021, status post colonoscopy which showed a frond like /villous nonobstructing large mass was found in the cecum, this was biopsied.  Terminal ileum was briefly intubated and appeared normal.  Diverticulosis in the sigmoid colon.  The rectum, sigmoid colon, descending colon, transverse colon and ascending colon were normal.  Nonbleeding internal hemorrhoids. 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 Given that the biopsy may not be representative of the entire underlying lesion.  Patient was  recommended to establish with surgeon for evaluation. Patient no showed for her surgery appointment and as well as her follow-up appointment with gastroenterology.  Per daughter, patient was having cervical spine surgery and she prioritized that over the colon surgery.   10/20/2021 - 01/07/2022 Chemotherapy    COLORECTAL 5-FU  + Bevacizumab  q14d      10/20/2021 - 11/23/2022 Chemotherapy   Patient is on Treatment Plan : COLORECTAL 5FU + Leucovorin  (Modified DeGramont) + Bevacizumab  q14d     12/22/2021 Imaging   CT chest abdomen pelvis Decreased cecum mass, however increased size and size of metastatic lesions throughout the liver.  Interval enlargement of vaginal cuff mass. No mets in chest.    01/21/2022 -  Chemotherapy   COLORECTAL FOLFOX  + Bevacizumab  q14d    patient was seen by gynecology oncology Dr. Mancil.  Biopsy of the vaginal mass was not recommended.Patient's case was also discussed on multidisciplinary tumor board.  IR potentially can try a biopsy if patient is willing to.I had a discussion with patient's daughter over the phone prior to this visit.  We discussed about option of adding oxaliplatin  to 5-FU/bevacizumab  and continue treatment of colon cancer, repeat CT scan short-term and if progression, repeat biopsy versus proceeding with vaginal mass biopsy.  Daughter prefers proceeding with chemotherapy and defer biopsy.   01/08/2022 patient has had a second opinion at [facility] and was seen by Dr. Brenna .  Dr. Brenna agrees with the current treatment plan with FOLFOX/bevacizumab .  He has ordered guardant 360 to see if she may be eligible  for clinical trial in the future.  -   04/13/2022 Imaging    CT chest abdomen pelvis showed a cecal mass with stable hepatic metastasis.  Mixed cystic and solid mass in the region of the vaginal cuff, new area of patchy groundglass and consolidation in the right upper lobe, lingula and left lower lobe.  Likely due to pneumonia.  Hepatic steatosis.   Bilateral renal stones.  Aortic atherosclerosis   06/24/2022 Imaging   CT abdomen pelvis with contrast showed There are low-density lesions in liver consistent with metastatic disease with no significant change. Wall thickening in the cecum appears less prominent. There is 5.5 x 4.2 cm mixed density lesion in vaginal cuff suggesting possible metastatic disease.   There is no evidence of intestinal obstruction or pneumoperitoneum.There is no hydronephrosis.   Small patchy infiltrates are seen in the lower lung fields suggesting atelectasis/pneumonia.   Small bilateral renal stones. Diverticulosis of colon without signs of diverticulitis. 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 There is encroachment of neural foramina at L4-L5 and L5-S1 levels.     06/25/2022 Imaging   CT soft tissue neck with contrast showed1No acute process in the neck to explain the patient's difficulty swallowing.  CT chest contrast showed 1. Patchy areas of airspace opacity in the right upper lobe and left lower lobe and lingula slightly progressed since the prior radiograph and may represent worsening or recurrent pneumonia. Clinical correlation and follow-up to resolution recommended. 2. Aortic Atherosclerosis    10/14/2022 Imaging   CT chest abdomen pelvis with contrast showed Stable small hypovascular liver metastases.  Stable mass in pelvic cul-de-sac involving the vaginal cuff,consistent with metastatic disease.  No new or progressive metastatic disease within the chest, abdomen,or pelvis. Stable bilateral bilateral airspace disease, suspicious for pneumonia or inflammatory etiology.   04/24/2024 -  Chemotherapy   Patient is on Treatment Plan : COLORECTAL Bevacizumab  q14d        Discussed the use of AI scribe software for clinical note transcription with the patient, who gave verbal consent to proceed.  History of Present Illness Chelsea [name] is a 78 year old female with  metastatic colon cancer who presents for follow-up. She is accompanied by her son-in-law.  She feels well overall and has gained four pounds since her last visit. There are no new symptoms such as sickness or falling. She experiences a slight growling in her stomach without pain. Her energy levels have improved since stopping chemotherapy, and she feels rejuvenated.  She is eating well and attributes her weight gain to being able to eat a variety of foods. She previously received 5FU via a pump every two weeks. A Guardian 360 liquid biopsy in May showed the same mutation as in the tumor, with no available targeted therapy for this mutation.     All other systems were reviewed with the patient and are negative.  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)    Moderate mitral insufficiency    Port-A-Cath in place    Seizures [facility])    Spinal stenosis    Ulcer    Urinary incontinence     SURGICAL HISTORY: Past Surgical History:  Procedure Laterality Date   ABDOMINAL HYSTERECTOMY     BACK SURGERY     due to polio  BREAST BIOPSY Bilateral    neg   BREAST BIOPSY Right 2011   neg/stereo   CARPAL TUNNEL RELEASE     CATARACT EXTRACTION Right 2020   COLONOSCOPY WITH PROPOFOL  N/A 04/04/2021   Procedure: COLONOSCOPY WITH PROPOFOL ;  Surgeon: Janalyn Keene NOVAK, MD;  Location: [facility] ENDOSCOPY;  Service: Endoscopy;  Laterality: N/A;   ESOPHAGOGASTRODUODENOSCOPY (EGD) WITH PROPOFOL  N/A 06/25/2022   Procedure: ESOPHAGOGASTRODUODENOSCOPY (EGD) WITH PROPOFOL ;  Surgeon: Unk Corinn Skiff, MD;  Location: [facility] ENDOSCOPY;  Service: Gastroenterology;  Laterality: N/A;   EYE SURGERY     IR IMAGING GUIDED PORT INSERTION  10/09/2021    I have reviewed the social history and family history with the patient and they are unchanged from previous note.  ALLERGIES:   is allergic to penicillins.  MEDICATIONS:  Current Outpatient Medications  Medication Sig Dispense Refill   capecitabine  (XELODA ) 500 MG tablet Take 2 tablets in morning and 3 tabs in evening, every 10-12 hours, take 30 mins after meals. Take 7 days on and 7 days off 35 tablet 1   acetaminophen  (TYLENOL ) 500 MG tablet Take 500 mg by mouth every 6 (six) hours as needed.     ALPRAZolam  (XANAX ) 0.5 MG tablet TAKE 1 TABLET BY MOUTH TWICE A DAY AS NEEDED FOR ANXIETY 10.11.23 60 tablet 3   aspirin  EC 81 MG tablet Take 1 tablet (81 mg total) by mouth daily. Swallow whole. 30 tablet 12   atorvastatin  (LIPITOR) 10 MG tablet TAKE 1 TABLET BY MOUTH EVERY DAY 90 tablet 0   busPIRone  (BUSPAR ) 5 MG tablet TAKE 1 TABLET BY MOUTH THREE TIMES A DAY 270 tablet 0   Cyanocobalamin  (VITAMIN B 12 PO) Take 500 mcg by mouth daily.     diclofenac  Sodium (VOLTAREN ) 1 % GEL Apply 4 g topically 4 (four) times daily. 4 g 0   docusate sodium  (COLACE) 100 MG capsule Take 1 capsule (100 mg total) by mouth 2 (two) times daily. 60 capsule 0   FLUoxetine  (PROZAC ) 20 MG capsule TAKE 3 CAPSULES (60 MG TOTAL) BY MOUTH EVERY MORNING. 270 capsule 0   gabapentin  (NEURONTIN ) 300 MG capsule TAKE 1 CAPSULE BY MOUTH THREE TIMES A DAY 90 capsule 3   lidocaine -prilocaine  (EMLA ) cream Apply small amount of cream to port site 1-2 hour prior to chemo treatment. 30 g 6   lidocaine -prilocaine  (EMLA ) cream Apply to affected area once 30 g 3   loperamide  (IMODIUM ) 2 MG capsule TAKE 1 CAP BY MOUTH SEE ADMIN INSTRUCTIONS. INITIAL: 4 MG, FOLLOWED BY 2 MG AFTER EACH LOOSE STOOL MAXIMUM: 16 MG/DAY 60 capsule 0   loratadine  (CLARITIN ) 10 MG tablet Take 1 tablet (10 mg total) by mouth See admin instructions. Take 1 tablet daily for 4 days after each chemotherapy treatments. 90 tablet 0   magic mouthwash w/lidocaine  SOLN Take 5 mLs by mouth 4 (four) times daily as needed for mouth pain. Sig: Swish/Swallow 5-10 ml four times a day as needed. Dispense 480 ml.  1RF 480 mL 1   meclizine  (ANTIVERT ) 12.5 MG tablet Take 1 tablet (12.5 mg total) by mouth 3 (three) times daily as needed for dizziness. 30 tablet 0   methocarbamol  (ROBAXIN ) 500 MG tablet Take 1 tablet (500 mg total) by mouth 2 (two) times daily as needed for muscle spasms. 180 tablet 0   ondansetron  (ZOFRAN ) 8 MG tablet Take 1 tablet (8 mg total) by mouth 2 (two) times daily as needed (Nausea or vomiting). 30 tablet 1   polyethylene glycol powder (GLYCOLAX /MIRALAX )  17 GM/SCOOP powder Take 17 g by mouth daily. 3350 g 1   potassium chloride  SA (KLOR-CON  M) 20 MEQ tablet TAKE 1 TABLET EVERY DAY 90 tablet 3   triamterene -hydrochlorothiazide  (MAXZIDE -25) 37.5-25 MG tablet TAKE 1 TABLET EVERY DAY 90 tablet 3   No current facility-administered medications for this visit.   Facility-Administered Medications Ordered in Other Visits  Medication Dose Route Frequency Provider Last Rate Last Admin   heparin  lock flush 100 UNIT/ML injection            palonosetron  (ALOXI ) 0.25 MG/5ML injection            prochlorperazine  (COMPAZINE ) 10 MG tablet             PHYSICAL EXAMINATION: ECOG PERFORMANCE STATUS: 2 - Symptomatic, <50% confined to bed  Vitals:   04/11/24 1345  BP: 129/67  Pulse: 76  Resp: 17  Temp: 98.2 F (36.8 C)  SpO2: 100%   Wt Readings from Last 3 Encounters:  04/11/24 137 lb (62.1 kg)  02/07/24 139 lb 3.2 oz (63.1 kg)  02/02/24 136 lb 12.8 oz (62.1 kg)     GENERAL:alert, no distress and comfortable SKIN: skin color, texture, turgor are normal, no rashes or significant lesions EYES: normal, Conjunctiva are pink and non-injected, sclera clear Musculoskeletal:no cyanosis of digits and no clubbing  NEURO: alert & oriented x 3 with fluent speech, no focal motor/sensory deficits  Physical Exam    LABORATORY DATA:  I have reviewed the data as listed    Latest Ref Rng & Units 04/11/2024    1:24 PM 02/07/2024    3:18 PM 02/02/2024    3:07 PM  CBC  WBC 4.0 - 10.5 K/uL 4.1  3.9   3.6   Hemoglobin 12.0 - 15.0 g/dL 88.2  88.8  88.8   Hematocrit 36.0 - 46.0 % 37.5  34.0  34.2   Platelets 150 - 400 K/uL 148  137  152.0         Latest Ref Rng & Units 04/11/2024    1:24 PM 02/07/2024    3:18 PM 02/02/2024    3:07 PM  CMP  Glucose 70 - 99 mg/dL 94  89  74   BUN 8 - 23 mg/dL 15  17  26    Creatinine 0.44 - 1.00 mg/dL 9.12  9.30  9.08   Sodium 135 - 145 mmol/L 142  140  137   Potassium 3.5 - 5.1 mmol/L 3.7  3.8  3.9   Chloride 98 - 111 mmol/L 103  104  102   CO2 22 - 32 mmol/L 35  33  29   Calcium  8.9 - 10.3 mg/dL 9.5  9.3  9.5   Total Protein 6.5 - 8.1 g/dL 7.1  7.3  7.2   Total Bilirubin 0.0 - 1.2 mg/dL 0.6  0.4  0.4   Alkaline Phos 38 - 126 U/L 316  118  102   AST 15 - 41 U/L 102  33  29   ALT 0 - 44 U/L 105  36  24       RADIOGRAPHIC STUDIES: I have personally reviewed the radiological images as listed and agreed with the findings in the report. No results found.    Orders Placed This Encounter  Procedures   Consent Attestation for Oncology Treatment    The patient is informed of risks, benefits, side-effects of the prescribed oncology treatment. Potential short term and long term side effects and response rates discussed. After a long discussion,  the patient made informed decision to proceed.:   Yes   CT CHEST ABDOMEN PELVIS W CONTRAST    Standing Status:   Future    Expected Date:   04/21/2024    Expiration Date:   04/11/2025    If indicated for the ordered procedure, I authorize the administration of contrast media per Radiology protocol:   Yes    Does the patient have a contrast media/X-ray dye allergy?:   No    Preferred imaging location?:   [facility]    Release to patient:   Immediate    If indicated for the ordered procedure, I authorize the administration of oral contrast media per Radiology protocol:   Yes   [facility] (Access)-[facility] ONLY    Standing Status:   Standing    Number of Occurrences:   30    Expiration Date:   04/11/2025   CBC with  Differential (Cancer Center Only)    Standing Status:   Future    Expected Date:   04/24/2024    Expiration Date:   04/24/2025   Total Protein, Urine dipstick    Standing Status:   Future    Expected Date:   04/24/2024    Expiration Date:   04/24/2025   CBC with Differential (Cancer Center Only)    Standing Status:   Future    Expected Date:   05/08/2024    Expiration Date:   05/08/2025   CBC with Differential (Cancer Center Only)    Standing Status:   Future    Expected Date:   05/22/2024    Expiration Date:   05/22/2025   Total Protein, Urine dipstick    Standing Status:   Future    Expected Date:   05/22/2024    Expiration Date:   05/22/2025   PHYSICIAN COMMUNICATION ORDER    UA Protein, dipstick required prior to every 3rd cycle bevacizumab    All questions were answered. The patient knows to call the clinic with any problems, questions or concerns. No barriers to learning was detected. The total time spent in the appointment was 40 minutes, including review of chart and various tests results, discussions about plan of care and coordination of care plan     Onita Mattock, MD 04/11/2024

## 2024-04-11 NOTE — Progress Notes (Signed)
 PATIENT NAVIGATOR PROGRESS NOTE  Name: Chelsea [name] Date: 04/11/2024 MRN: [id number]  DOB: 21-Jun-1946   Reason for visit:  Follow-up with Dr. Lanny  Comments:   Patient was last seen in office back in May and wanted to take some time to consider her treatment options.  Patient seen today during her follow-up appointment with Dr. Lanny and was accompanied with her Son-in-Law.  Patient has now decided to proceed with treatment, with plans to begin within the next 2 weeks.  Patient's SDOH were assessed.  Patient stated she requires assistance with transportation to and from appointments as she no longer drives.  Her daughter and son-in-law live nearby and have been providing transportation for patient.  Per son-in-law, he and his wife will continue to provide transportation for patient. Informed patient and son-in-law if patient needs transportation assistance in the future, to let us  know and we can put in a referral to our Presenter, broadcasting.  Patient and son-in-law verbalized understanding. No other needs were noted.     Time spent counseling/coordinating care: 45-60 minutes

## 2024-04-11 NOTE — Assessment & Plan Note (Signed)
 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  -Treatment course: began first line systemic chemotherapy with 5FU/leuc and bevacizumab  on 10/20/21. Restaging scans showed progression and Oxaliplatin  was added with cycle 7 on 01/21/22. She continued FOLFOX/Beva x12 cycles, oxali was removed with cycle 18 on 09/07/22. She continued 5FU/Beva for 4 additional cycles. She completed cycle 22 on 11/20/22 prior to moving to [place]. At [facility] she received additional cycles of 5FU/Beva through November 2024.  -CT 01/25/2024 showed disease progression in liver, and 2 new lung nodule, up to 1.1 cm, highly concerning for metastasis. -I recommended Xeloda  and Beva in May 2025, she wants to think about it

## 2024-04-12 ENCOUNTER — Other Ambulatory Visit: Payer: Self-pay

## 2024-04-12 MED ORDER — CAPECITABINE 500 MG PO TABS
ORAL_TABLET | ORAL | 1 refills | Status: DC
Start: 2024-04-12 — End: 2024-05-22
  Filled 2024-04-17: qty 35, 14d supply, fill #0
  Filled 2024-05-01: qty 35, 14d supply, fill #1

## 2024-04-14 ENCOUNTER — Telehealth: Payer: Self-pay | Admitting: Hematology

## 2024-04-14 NOTE — Telephone Encounter (Signed)
 Scheduled appointments per WQ. Talked with the patient and she is aware of the made appointments.

## 2024-04-17 ENCOUNTER — Other Ambulatory Visit: Payer: Self-pay

## 2024-04-17 NOTE — Telephone Encounter (Signed)
 Oral Chemotherapy Pharmacist Encounter  I spoke with patient for overview of: Xeloda  (capecitabine )  for the treatment of metastatic colon cancer in conjunction with bevacizumab , planned duration until disease progression or unacceptable drug toxicity.   Counseled patient on administration, dosing, side effects, monitoring, drug-food interactions, safe handling, storage, and disposal.  Patient will take Xeloda  500mg  tablets, 2 tablets (1000mg ) by mouth in AM and 3 tabs (1500mg ) by mouth in PM, within 30 minutes of finishing meals, for 7 days on, 7 days off, repeated every 14 days.  Xeloda  start date: 04/24/24  Adverse effects include but are not limited to: fatigue, decreased blood counts, GI upset, diarrhea, mouth sores, and hand-foot syndrome. Hand-foot syndrome: discussed use of cream such as [place]derly Smooth Extra Care 20 or equivalent advanced care cream that has 20% urea  content for advanced skin hydration while on Xeloda . Additionally discussed use of OTC Voltaren  gel for HFS prophylaxis. Discussed recommended use of Voltaren  gel is 1 finger tip application for front/backside of hands and then 1 fingertip application to bottoms of feet twice a day for up to 12 weeks.  Diarrhea: Patient will obtain Imodium  (loperamide ) to have on hand if they experience diarrhea. Patient knows to alert the office of 4 or more loose stools above baseline.  Reviewed with patient importance of keeping a medication schedule and plan for any missed doses. No barriers to medication adherence identified.  Medication reconciliation performed and medication/allergy list updated.  All questions answered.  Distress thermometer not completed during telephone call as patient has been on previous lines of therapy.   Patient voiced understanding and appreciation.   Medication education calendar placed in mail for patient. Patient knows to call the office with questions or concerns. Oral Chemotherapy Clinic phone number  provided to patient.   Asberry Macintosh, PharmD, BCPS, BCOP Hematology/Oncology Clinical Pharmacist [phone] 04/17/2024 1:58 PM

## 2024-04-17 NOTE — Progress Notes (Signed)
 Oral Chemotherapy Pharmacist Encounter  Patient was counseled under telephone encounter from 04/11/24.  Asberry Macintosh, PharmD, BCPS, BCOP Hematology/Oncology Clinical Pharmacist [facility] Oral Chemotherapy Navigation Clinics [phone] 04/17/2024 2:11 PM

## 2024-04-17 NOTE — Progress Notes (Signed)
 Specialty Pharmacy Initial Fill Coordination Note  Chelsea [name] is a 78 y.o. female contacted today regarding refills of specialty medication(s) Capecitabine  (XELODA ) .  Patient requested [name] at [facility] at [place]  on 04/20/24   Medication will be filled on 07/16.   Patient is aware of $14.70 copayment.

## 2024-04-18 ENCOUNTER — Telehealth: Payer: Self-pay

## 2024-04-18 ENCOUNTER — Other Ambulatory Visit: Payer: Self-pay

## 2024-04-18 NOTE — Telephone Encounter (Signed)
 Pt called stating that Dr. Lanny ordered a CT Scan on this pt which is scheduled for 04/19/2024.  Pt stated she currently does not have transportation to this appointment.  Pt wanted to know what should she do.  Stated this nurse will contact the hospital transportation coordinator Sherlean Menghini regarding hospital transportation and he or this nurse will f/u with pt.  Pt verbalized understanding and stated she will await the return call.  Sent email to [employer] regarding hospital transportation.  Awaiting his response.

## 2024-04-19 ENCOUNTER — Ambulatory Visit (HOSPITAL_COMMUNITY)
Admission: RE | Admit: 2024-04-19 | Discharge: 2024-04-19 | Disposition: A | Discharge status: Home / Self Care | Payer: Medicare (Managed Care) | Source: Ambulatory Visit | Attending: Hematology | Admitting: Hematology

## 2024-04-19 DIAGNOSIS — C787 Secondary malignant neoplasm of liver and intrahepatic bile duct: Secondary | ICD-10-CM | POA: Insufficient documentation

## 2024-04-19 DIAGNOSIS — C189 Malignant neoplasm of colon, unspecified: Secondary | ICD-10-CM | POA: Diagnosis present

## 2024-04-19 MED ORDER — IOHEXOL 9 MG/ML PO SOLN
1000.0000 mL | Freq: Once | ORAL | Status: AC
  Administered 2024-04-19: 1000 mL via ORAL

## 2024-04-19 MED ORDER — IOHEXOL 300 MG/ML  SOLN
100.0000 mL | Freq: Once | INTRAMUSCULAR | Status: AC | PRN
  Administered 2024-04-19: 100 mL via INTRAVENOUS

## 2024-04-20 ENCOUNTER — Other Ambulatory Visit (HOSPITAL_COMMUNITY): Payer: Self-pay

## 2024-04-20 ENCOUNTER — Other Ambulatory Visit: Payer: Self-pay

## 2024-04-21 ENCOUNTER — Ambulatory Visit: Payer: Self-pay

## 2024-04-21 NOTE — Telephone Encounter (Signed)
 RN attempted second contact to discuss Covid exposure. Will attempt contact at a later time.

## 2024-04-21 NOTE — Telephone Encounter (Signed)
 First attempt to contact pt regarding questions with covid exposure. Pt will need PCP advice about covid boosters, plan to triage for symptoms with recent exposure.      Message from [place] sent at 04/21/2024  1:38 PM EDT  Reason for Triage: patient's daughter recently got COVID and patient is wondering how often she is supposed to be getting COVID booster shots especially at her age as she does not want to contract COVID. Patient also talked about shingles vaccination but I believe talking further into the conversation she has all of the information from the pharmacist she needed for that, the COVID booster is her biggest concern right now and she would like to speak with a nurse to see if she needs to get one.

## 2024-04-21 NOTE — Telephone Encounter (Signed)
 Third attempt to contact pt, no answer, LVM for call back to office, advised seek care if symptoms. Routing to office for follow up.

## 2024-04-23 NOTE — Assessment & Plan Note (Signed)
 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  -Treatment course: began first line systemic chemotherapy with 5FU/leuc and bevacizumab  on 10/20/21. Restaging scans showed progression and Oxaliplatin  was added with cycle 7 on 01/21/22. She continued FOLFOX/Beva x12 cycles, oxali was removed with cycle 18 on 09/07/22. She continued 5FU/Beva for 4 additional cycles. She completed cycle 22 on 11/20/22 prior to moving to [place]. At [facility] she received additional cycles of 5FU/Beva through November 2024.  -CT 01/25/2024 showed disease progression in liver, and 2 new lung nodule, up to 1.1 cm, highly concerning for metastasis. -I recommended Xeloda  and Beva in May 2025, she wants to think about it  she finally started on 04/24/2024

## 2024-04-24 ENCOUNTER — Encounter: Payer: Self-pay | Admitting: Hematology

## 2024-04-24 ENCOUNTER — Inpatient Hospital Stay (HOSPITAL_BASED_OUTPATIENT_CLINIC_OR_DEPARTMENT_OTHER): Payer: Medicare (Managed Care) | Admitting: Hematology

## 2024-04-24 ENCOUNTER — Inpatient Hospital Stay: Payer: Medicare (Managed Care)

## 2024-04-24 ENCOUNTER — Telehealth: Payer: Self-pay

## 2024-04-24 DIAGNOSIS — C787 Secondary malignant neoplasm of liver and intrahepatic bile duct: Secondary | ICD-10-CM

## 2024-04-24 DIAGNOSIS — C189 Malignant neoplasm of colon, unspecified: Secondary | ICD-10-CM

## 2024-04-24 DIAGNOSIS — C180 Malignant neoplasm of cecum: Secondary | ICD-10-CM | POA: Diagnosis not present

## 2024-04-24 NOTE — Telephone Encounter (Signed)
 Noted

## 2024-04-24 NOTE — Telephone Encounter (Signed)
 Contacted pt's daughter regarding MyChart message sent by pt's daughter to Dr. Demetra office.  Contacted pt to explain that she is to take the PO Capecitabine  and start IV Bevacizumab -awwb ([place]).  Pt stated she was not told she would be taking IV medication and then stated she is still asleep and hung up the telephone.  Contacted pt's daughter to explain the conversation with the pt.  Pt's daughter stated that she didn't think that what the pt was saying was correct.  Pt's daughter stated she will bring pt to the appts today.    Contacted [facility] to expedite the read on this pt's recent CT Scan.

## 2024-04-24 NOTE — Progress Notes (Signed)
 [facility]   [phone] [phone]   Clinic Follow up Note   Patient Care Team: [name], [name], MD as PCP - General (Internal Medicine) [name], RN as Oncology Nurse Navigator [name], MD as Consulting Physician (Oncology) 04/24/2024  I connected with Chelsea [name] on 04/24/24 at 10:40 AM EDT by telephone and verified that I am speaking with the correct person using two identifiers.   I discussed the limitations, risks, security and privacy concerns of performing an evaluation and management service by telephone and the availability of in person appointments. I also discussed with the patient that there may be a patient responsible charge related to this service. The patient expressed understanding and agreed to proceed.   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  -Treatment course: began first line systemic chemotherapy with 5FU/leuc and bevacizumab  on 10/20/21. Restaging scans showed progression and Oxaliplatin  was added with cycle 7 on 01/21/22. She continued FOLFOX/Beva x12 cycles, oxali was removed with cycle 18 on 09/07/22. She continued 5FU/Beva for 4 additional cycles. She completed cycle 22 on 11/20/22 prior to moving to [place]. At [facility] she received additional cycles of 5FU/Beva through November 2024.  -CT 01/25/2024 showed disease progression in liver, and 2 new lung nodule, up to 1.1 cm, highly concerning for metastasis. -I recommended Xeloda  and Beva in May 2025, she wants to think about it  she finally started on 04/24/2024  Assessment & Plan Metastatic colon cancer Metastatic colon  cancer with ongoing treatment. She will start oral chemotherapy keep him in today and was scheduled for bevacizumab  infusion, a biological agent that aids the efficacy of the oral chemotherapy by targeting blood vessels in the tumor. She expressed concerns and confusion regarding the infusion, mistaking it for intravenous chemotherapy, which she wishes to avoid. It was clarified that bevacizumab  is not a chemotherapy drug and does not cause typical chemotherapy side effects like nausea or diarrhea.  After repeated explantations, she agrees to think about it and will keep her infusion appointment next time.   She is on a reduced dose of oral chemotherapy due to her age to minimize adverse effects. She is concerned about potential side effects such as darkening of the hands and feet, which can occur with the oral chemotherapy. - Start oral chemotherapy tonight with a regimen of three pills at night and two in the morning for seven days, then stop. - Schedule bevacizumab  infusion for August 4th. - Perform lab work on August 4th prior to the infusion. - Ensure medication is taken after meals to avoid gastrointestinal upset.  Plan - She will start Xeloda  tonight, she knows to continue for 7 days, then off for 7 days - Per her request, we will cancel her bevacizumab  infusion today - Lab, follow-up and bevacizumab  infusion on August 4, which has been scheduled.   SUMMARY OF ONCOLOGIC HISTORY: Oncology History  Metastatic colon cancer to liver (HCC)  08/21/2021 Imaging   CT abdomen pelvis with contrast  showed soft tissue mass at the base of cecum measuring up to 5 cm, slightly increased in size.  Interval development of multiple low-density lesions throughout the liver highly suggestive for metastatic disease.  Complex mass in the region of the vaginal cuff measuring up to 5.2 cm, increased in size.  Right ovary also appears more prominent on the current examination.  Moderate large volume of stool  throughout the colon.  Mild urinary bladder wall thickening.  Colonic diverticulosis without evidence of active diverticulitis.  Aortic atherosclerosis   09/04/2021 Procedure   patient is status post left lobe liver lesion biopsy and pathology showed metastatic adenocarcinoma, compatible with colorectal primary.   09/04/2021 Cancer Staging   Staging form: Colon and Rectum, AJCC 8th Edition - Clinical stage from 09/04/2021: Stage Unknown (cTX, cNX, cM1) - Signed by [name], MD on 05/13/2022 Stage prefix: Initial diagnosis   09/16/2021 Initial Diagnosis   Metastatic colon cancer to liver [facility] one liquid biopsy testing showed KRASG12V, APC TP53 TMB 4  Patient initially went to emergency room on 03/24/2021 for abdominal pain. 03/24/2021, CT scan of the abdomen showed marked bladder wall thickening with surrounding soft tissue stranding is noted.  Concerning for cystitis.  There is a lobulated mass between the posterior wall of the bladder and the rectum which appears to arise from the vaginal cuff.  This is indeterminate and difficult to characterize reflecting lack of IV contrast material.  Nonobstructing left renal calculus, left sacroiliitis, lumbar spondylosis aortic atherosclerosis. 03/24/2021 CT pelvic showed abdominal T[place]q soft tissue of right cecum, concerning for colon carcinoma.  Colonoscopy recommended.  Low-attenuation mass in the region of vaginal cuff is again noted.  Diffuse urinary bladder wall thickening and mild bilateral hydroureter possibly cystitis.   US  03/24/2021 Lobulated solid 3.8 cm mass confirmed by ultrasound. This is most compatible with a neoplasm but remains indeterminate as to the origin as the epicenter seems to be outside of both the vaginal cuff and the adjacent colon, while the lesion appears inseparable from both. Extensive echogenic debris within the distended urinary bladder.   03/26/2021, patient was seen by Dr. Elby for evaluation of colonic/vaginal  cuff pelvic mass.  Patient also was referred to gastroenterology for colonoscopy.   03/26/2021 CEA 3.4.  CA125 9.7 04/04/2021, status post colonoscopy which showed a frond like /villous nonobstructing large mass was found in the cecum, this was biopsied.  Terminal ileum was briefly intubated and appeared normal.  Diverticulosis in the sigmoid colon.  The rectum, sigmoid colon, descending colon, transverse colon and ascending colon were normal.  Nonbleeding internal hemorrhoids. 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 Given that the biopsy may not be representative of the entire underlying lesion.  Patient was recommended to establish with surgeon for evaluation. Patient no showed for her surgery appointment and as well as her follow-up appointment with gastroenterology.  Per daughter, patient was having cervical spine surgery and she prioritized that over the colon surgery.   10/20/2021 - 01/07/2022 Chemotherapy    COLORECTAL 5-FU  + Bevacizumab  q14d      10/20/2021 - 11/23/2022 Chemotherapy   Patient is on Treatment Plan : COLORECTAL 5FU + Leucovorin  (Modified DeGramont) + Bevacizumab  q14d     12/22/2021 Imaging   CT chest abdomen pelvis Decreased cecum mass, however increased size and size of metastatic lesions throughout the liver.  Interval enlargement of vaginal cuff mass. No mets in chest.    01/21/2022 -  Chemotherapy   COLORECTAL FOLFOX  + Bevacizumab  q14d    patient was seen by gynecology oncology Dr. Mancil.  Biopsy of the vaginal mass was not recommended.Patient's case was also discussed on multidisciplinary tumor board.  IR potentially can try  a biopsy if patient is willing to.I had a discussion with patient's daughter over the phone prior to this visit.  We discussed about option of adding oxaliplatin  to 5-FU/bevacizumab  and continue treatment of colon cancer, repeat CT scan short-term and if progression, repeat biopsy versus proceeding with vaginal mass  biopsy.  Daughter prefers proceeding with chemotherapy and defer biopsy.   01/08/2022 patient has had a second opinion at [facility] and was seen by Dr. Brenna .  Dr. Brenna agrees with the current treatment plan with FOLFOX/bevacizumab .  He has ordered guardant 360 to see if she may be eligible for clinical trial in the future.  -   04/13/2022 Imaging    CT chest abdomen pelvis showed a cecal mass with stable hepatic metastasis.  Mixed cystic and solid mass in the region of the vaginal cuff, new area of patchy groundglass and consolidation in the right upper lobe, lingula and left lower lobe.  Likely due to pneumonia.  Hepatic steatosis.  Bilateral renal stones.  Aortic atherosclerosis   06/24/2022 Imaging   CT abdomen pelvis with contrast showed There are low-density lesions in liver consistent with metastatic disease with no significant change. Wall thickening in the cecum appears less prominent. There is 5.5 x 4.2 cm mixed density lesion in vaginal cuff suggesting possible metastatic disease.   There is no evidence of intestinal obstruction or pneumoperitoneum.There is no hydronephrosis.   Small patchy infiltrates are seen in the lower lung fields suggesting atelectasis/pneumonia.   Small bilateral renal stones. Diverticulosis of colon without signs of diverticulitis. 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 There is encroachment of neural foramina at L4-L5 and L5-S1 levels.     06/25/2022 Imaging   CT soft tissue neck with contrast showed1No acute process in the neck to explain the patient's difficulty swallowing.  CT chest contrast showed 1. Patchy areas of airspace opacity in the right upper lobe and left lower lobe and lingula slightly progressed since the prior radiograph and may represent worsening or recurrent pneumonia. Clinical correlation and follow-up to resolution recommended. 2. Aortic Atherosclerosis    10/14/2022 Imaging   CT chest abdomen pelvis with  contrast showed Stable small hypovascular liver metastases.  Stable mass in pelvic cul-de-sac involving the vaginal cuff,consistent with metastatic disease.  No new or progressive metastatic disease within the chest, abdomen,or pelvis. Stable bilateral bilateral airspace disease, suspicious for pneumonia or inflammatory etiology.   04/24/2024 -  Chemotherapy   Patient is on Treatment Plan : COLORECTAL Bevacizumab  q14d       Discussed the use of AI scribe software for clinical note transcription with the patient, who gave verbal consent to proceed.  History of Present Illness Chelsea [name] is a 78 year old female with metastatic colon cancer who presents for a follow-up visit.  She is confused about her treatment plan, particularly regarding the infusion scheduled for today, which she initially thought was chemotherapy. She prefers to take only the oral chemotherapy pills and not undergo any intravenous chemotherapy. The infusion is bevacizumab , a biological agent that she has received in the past along with chemotherapy.  Her current oral chemotherapy regimen is 2500 mg per day, divided into two doses: two pills in the morning and three at night. She is to take the medication for seven days followed by a week off. She has not yet started the medication but will begin tonight after eating.  She is aware of potential side effects, including flaking on her hands and feet, but  her tongue will not turn black as it did previously. She is monitoring for any serious issues. She is taking approximately twelve other medications for various conditions, including those prescribed by her neurologist, and is concerned about potential interactions.     REVIEW OF SYSTEMS:   Constitutional: Denies fevers, chills or abnormal weight loss Eyes: Denies blurriness of vision Ears, nose, mouth, throat, and face: Denies mucositis or sore throat Respiratory: Denies cough, dyspnea or wheezes Cardiovascular:  Denies palpitation, chest discomfort or lower extremity swelling Gastrointestinal:  Denies nausea, heartburn or change in bowel habits Skin: Denies abnormal skin rashes Lymphatics: Denies new lymphadenopathy or easy bruising Neurological:Denies numbness, tingling or new weaknesses Behavioral/Psych: Mood is stable, no new changes  All other systems were reviewed with the patient and are negative.  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)    Moderate mitral insufficiency    Port-A-Cath in place    Seizures [facility])    Spinal stenosis    Ulcer    Urinary incontinence     SURGICAL HISTORY: Past Surgical History:  Procedure Laterality Date   ABDOMINAL HYSTERECTOMY     BACK SURGERY     due to polio   BREAST BIOPSY Bilateral    neg   BREAST BIOPSY Right 2011   neg/stereo   CARPAL TUNNEL RELEASE     CATARACT EXTRACTION Right 2020   COLONOSCOPY WITH PROPOFOL  N/A 04/04/2021   Procedure: COLONOSCOPY WITH PROPOFOL ;  Surgeon: Janalyn Keene NOVAK, MD;  Location: [facility] ENDOSCOPY;  Service: Endoscopy;  Laterality: N/A;   ESOPHAGOGASTRODUODENOSCOPY (EGD) WITH PROPOFOL  N/A 06/25/2022   Procedure: ESOPHAGOGASTRODUODENOSCOPY (EGD) WITH PROPOFOL ;  Surgeon: Unk Corinn Skiff, MD;  Location: [facility] ENDOSCOPY;  Service: Gastroenterology;  Laterality: N/A;   EYE SURGERY     IR IMAGING GUIDED PORT INSERTION  10/09/2021    I have reviewed the social history and family history with the patient and they are unchanged from previous note.  ALLERGIES:  is allergic to penicillins.  MEDICATIONS:  Current Outpatient Medications  Medication Sig Dispense Refill   acetaminophen  (TYLENOL ) 500 MG tablet Take 500 mg by mouth every 6 (six) hours as needed.     ALPRAZolam  (XANAX ) 0.5 MG tablet TAKE 1 TABLET BY MOUTH TWICE A DAY AS NEEDED FOR ANXIETY 10.11.23 60 tablet 3    aspirin  EC 81 MG tablet Take 1 tablet (81 mg total) by mouth daily. Swallow whole. 30 tablet 12   atorvastatin  (LIPITOR) 10 MG tablet TAKE 1 TABLET BY MOUTH EVERY DAY 90 tablet 0   busPIRone  (BUSPAR ) 5 MG tablet TAKE 1 TABLET BY MOUTH THREE TIMES A DAY 270 tablet 0   capecitabine  (XELODA ) 500 MG tablet Take 2 tablets (1000 mg total) by mouth in morning and 3 tablets (1500 mg total) by mouth in evening, every 10-12 hours. Take within 30 minutes after meals. Take for 7 days on, then 7 days off. Repeat every 14 days. 35 tablet 1   Cyanocobalamin  (VITAMIN B 12 PO) Take 500 mcg by mouth daily.     diclofenac  Sodium (VOLTAREN ) 1 % GEL Apply 4 g topically 4 (four) times daily. 4 g 0   docusate sodium  (COLACE) 100 MG capsule Take 1 capsule (100 mg total) by mouth 2 (two) times daily. 60 capsule 0   FLUoxetine  (PROZAC ) 20  MG capsule TAKE 3 CAPSULES (60 MG TOTAL) BY MOUTH EVERY MORNING. 270 capsule 0   gabapentin  (NEURONTIN ) 300 MG capsule TAKE 1 CAPSULE BY MOUTH THREE TIMES A DAY 90 capsule 3   lidocaine -prilocaine  (EMLA ) cream Apply small amount of cream to port site 1-2 hour prior to chemo treatment. 30 g 6   lidocaine -prilocaine  (EMLA ) cream Apply to affected area once 30 g 3   loperamide  (IMODIUM ) 2 MG capsule TAKE 1 CAP BY MOUTH SEE ADMIN INSTRUCTIONS. INITIAL: 4 MG, FOLLOWED BY 2 MG AFTER EACH LOOSE STOOL MAXIMUM: 16 MG/DAY 60 capsule 0   loratadine  (CLARITIN ) 10 MG tablet Take 1 tablet (10 mg total) by mouth See admin instructions. Take 1 tablet daily for 4 days after each chemotherapy treatments. 90 tablet 0   magic mouthwash w/lidocaine  SOLN Take 5 mLs by mouth 4 (four) times daily as needed for mouth pain. Sig: Swish/Swallow 5-10 ml four times a day as needed. Dispense 480 ml. 1RF 480 mL 1   meclizine  (ANTIVERT ) 12.5 MG tablet Take 1 tablet (12.5 mg total) by mouth 3 (three) times daily as needed for dizziness. 30 tablet 0   methocarbamol  (ROBAXIN ) 500 MG tablet Take 1 tablet (500 mg total) by mouth  2 (two) times daily as needed for muscle spasms. 180 tablet 0   ondansetron  (ZOFRAN ) 8 MG tablet Take 1 tablet (8 mg total) by mouth 2 (two) times daily as needed (Nausea or vomiting). 30 tablet 1   polyethylene glycol powder (GLYCOLAX /MIRALAX ) 17 GM/SCOOP powder Take 17 g by mouth daily. 3350 g 1   potassium chloride  SA (KLOR-CON  M) 20 MEQ tablet TAKE 1 TABLET EVERY DAY 90 tablet 3   triamterene -hydrochlorothiazide  (MAXZIDE -25) 37.5-25 MG tablet TAKE 1 TABLET EVERY DAY 90 tablet 3   No current facility-administered medications for this visit.   Facility-Administered Medications Ordered in Other Visits  Medication Dose Route Frequency Provider Last Rate Last Admin   heparin  lock flush 100 UNIT/ML injection            palonosetron  (ALOXI ) 0.25 MG/5ML injection            prochlorperazine  (COMPAZINE ) 10 MG tablet             PHYSICAL EXAMINATION: Not performed   LABORATORY DATA:  I have reviewed the data as listed    Latest Ref Rng & Units 04/11/2024    1:24 PM 02/07/2024    3:18 PM 02/02/2024    3:07 PM  CBC  WBC 4.0 - 10.5 K/uL 4.1  3.9  3.6   Hemoglobin 12.0 - 15.0 g/dL 88.2  88.8  88.8   Hematocrit 36.0 - 46.0 % 37.5  34.0  34.2   Platelets 150 - 400 K/uL 148  137  152.0         Latest Ref Rng & Units 04/11/2024    1:24 PM 02/07/2024    3:18 PM 02/02/2024    3:07 PM  CMP  Glucose 70 - 99 mg/dL 94  89  74   BUN 8 - 23 mg/dL 15  17  26    Creatinine 0.44 - 1.00 mg/dL 9.12  9.30  9.08   Sodium 135 - 145 mmol/L 142  140  137   Potassium 3.5 - 5.1 mmol/L 3.7  3.8  3.9   Chloride 98 - 111 mmol/L 103  104  102   CO2 22 - 32 mmol/L 35  33  29   Calcium  8.9 - 10.3 mg/dL 9.5  9.3  9.5  Total Protein 6.5 - 8.1 g/dL 7.1  7.3  7.2   Total Bilirubin 0.0 - 1.2 mg/dL 0.6  0.4  0.4   Alkaline Phos 38 - 126 U/L 316  118  102   AST 15 - 41 U/L 102  33  29   ALT 0 - 44 U/L 105  36  24       RADIOGRAPHIC STUDIES: I have personally reviewed the radiological images as listed and agreed with  the findings in the report. No results found.     I discussed the assessment and treatment plan with the patient. The patient was provided an opportunity to ask questions and all were answered. The patient agreed with the plan and demonstrated an understanding of the instructions.   The patient was advised to call back or seek an in-person evaluation if the symptoms worsen or if the condition fails to improve as anticipated.  I provided 25 minutes of non face-to-face telephone visit time during this encounter, including review of chart and various tests results, discussions about plan of care and coordination of care plan.    Onita Mattock, MD 04/24/24

## 2024-05-01 ENCOUNTER — Other Ambulatory Visit: Payer: Self-pay

## 2024-05-01 ENCOUNTER — Ambulatory Visit: Payer: Medicare (Managed Care) | Admitting: Neurology

## 2024-05-01 ENCOUNTER — Encounter: Payer: Self-pay | Admitting: Neurology

## 2024-05-01 NOTE — Progress Notes (Signed)
 Specialty Pharmacy Ongoing Clinical Assessment Note  Chelsea [name] is a 78 y.o. female who is being followed by the specialty pharmacy service for RxSp Oncology   Patient's specialty medication(s) reviewed today: Capecitabine  (XELODA )   Missed doses in the last 4 weeks: 0   Patient/Caregiver did not have any additional questions or concerns.   Therapeutic benefit summary: Unable to assess   Adverse events/side effects summary: Experienced adverse events/side effects (fatigue)   Patient's therapy is appropriate to: Continue    Goals Addressed             This Visit's Progress    Slow Disease Progression       Patient is unable to be assessed as therapy was recently initiated. Patient will maintain adherence         Follow up: 3 months  [name] M Emile Kyllo Specialty Pharmacist

## 2024-05-01 NOTE — Progress Notes (Signed)
 Specialty Pharmacy Refill Coordination Note  Chelsea [name] is a 78 y.o. female contacted today regarding refills of specialty medication(s) Capecitabine  (XELODA )   Patient requested [name] at [facility] [place] date: 05/02/24   Medication will be filled on 05/01/24.

## 2024-05-01 NOTE — Progress Notes (Signed)
 Pharmacist Chemotherapy Monitoring - Initial Assessment    Anticipated start date: 05/08/24   The following has been reviewed per standard work regarding the patient's treatment regimen: The patient's diagnosis, treatment plan and drug doses, and organ/hematologic function Lab orders and baseline tests specific to treatment regimen  The treatment plan start date, drug sequencing, and pre-medications Prior authorization status  Patient's documented medication list, including drug-drug interaction screen and prescriptions for anti-emetics and supportive care specific to the treatment regimen The drug concentrations, fluid compatibility, administration routes, and timing of the medications to be used The patient's access for treatment and lifetime cumulative dose history, if applicable  The patient's medication allergies and previous infusion related reactions, if applicable   Changes made to treatment plan:  N/A  Follow up needed:  N/A   Chelsea [name], Pharm.D., CPP 05/01/2024@9 :26 AM

## 2024-05-02 ENCOUNTER — Other Ambulatory Visit (HOSPITAL_COMMUNITY): Payer: Self-pay

## 2024-05-08 ENCOUNTER — Inpatient Hospital Stay: Payer: Medicare (Managed Care)

## 2024-05-08 ENCOUNTER — Other Ambulatory Visit: Payer: Self-pay

## 2024-05-08 ENCOUNTER — Inpatient Hospital Stay: Payer: Medicare (Managed Care) | Attending: Nurse Practitioner | Admitting: Hematology

## 2024-05-08 VITALS — BP 136/86 | HR 68 | Temp 98.3°F | Resp 15 | Ht <= 58 in | Wt 143.6 lb

## 2024-05-08 DIAGNOSIS — C189 Malignant neoplasm of colon, unspecified: Secondary | ICD-10-CM | POA: Diagnosis not present

## 2024-05-08 DIAGNOSIS — C180 Malignant neoplasm of cecum: Secondary | ICD-10-CM | POA: Diagnosis present

## 2024-05-08 DIAGNOSIS — R32 Unspecified urinary incontinence: Secondary | ICD-10-CM | POA: Insufficient documentation

## 2024-05-08 DIAGNOSIS — D702 Other drug-induced agranulocytosis: Secondary | ICD-10-CM

## 2024-05-08 DIAGNOSIS — Z5112 Encounter for antineoplastic immunotherapy: Secondary | ICD-10-CM | POA: Diagnosis present

## 2024-05-08 DIAGNOSIS — C787 Secondary malignant neoplasm of liver and intrahepatic bile duct: Secondary | ICD-10-CM | POA: Diagnosis not present

## 2024-05-08 LAB — CMP (CANCER CENTER ONLY)
ALT: 22 U/L (ref 0–44)
AST: 31 U/L (ref 15–41)
Albumin: 3.6 g/dL (ref 3.5–5.0)
Alkaline Phosphatase: 201 U/L — ABNORMAL HIGH (ref 38–126)
Anion gap: 5 (ref 5–15)
BUN: 16 mg/dL (ref 8–23)
CO2: 33 mmol/L — ABNORMAL HIGH (ref 22–32)
Calcium: 9.2 mg/dL (ref 8.9–10.3)
Chloride: 102 mmol/L (ref 98–111)
Creatinine: 1.16 mg/dL — ABNORMAL HIGH (ref 0.44–1.00)
GFR, Estimated: 48 mL/min — ABNORMAL LOW (ref >60)
Glucose, Bld: 101 mg/dL — ABNORMAL HIGH (ref 70–99)
Potassium: 3.7 mmol/L (ref 3.5–5.1)
Sodium: 140 mmol/L (ref 135–145)
Total Bilirubin: 0.5 mg/dL (ref 0.0–1.2)
Total Protein: 7.1 g/dL (ref 6.5–8.1)

## 2024-05-08 LAB — CBC WITH DIFFERENTIAL (CANCER CENTER ONLY)
Abs Immature Granulocytes: 0.01 K/uL (ref 0.00–0.07)
Basophils Absolute: 0 K/uL (ref 0.0–0.1)
Basophils Relative: 1 %
Eosinophils Absolute: 0.1 K/uL (ref 0.0–0.5)
Eosinophils Relative: 2 %
HCT: 33 % — ABNORMAL LOW (ref 36.0–46.0)
Hemoglobin: 10.5 g/dL — ABNORMAL LOW (ref 12.0–15.0)
Immature Granulocytes: 0 %
Lymphocytes Relative: 30 %
Lymphs Abs: 1.3 K/uL (ref 0.7–4.0)
MCH: 30.3 pg (ref 26.0–34.0)
MCHC: 31.8 g/dL (ref 30.0–36.0)
MCV: 95.1 fL (ref 80.0–100.0)
Monocytes Absolute: 0.5 K/uL (ref 0.1–1.0)
Monocytes Relative: 12 %
Neutro Abs: 2.5 K/uL (ref 1.7–7.7)
Neutrophils Relative %: 55 %
Platelet Count: 164 K/uL (ref 150–400)
RBC: 3.47 MIL/uL — ABNORMAL LOW (ref 3.87–5.11)
RDW: 14.3 % (ref 11.5–15.5)
WBC Count: 4.4 K/uL (ref 4.0–10.5)
nRBC: 0 % (ref 0.0–0.2)

## 2024-05-08 LAB — CEA (ACCESS): CEA (CHCC): 120.01 ng/mL — ABNORMAL HIGH (ref 0.00–5.00)

## 2024-05-08 MED ORDER — SODIUM CHLORIDE 0.9% FLUSH
10.0000 mL | Freq: Once | INTRAVENOUS | Status: AC
  Administered 2024-05-08: 10 mL

## 2024-05-08 MED ORDER — SODIUM CHLORIDE 0.9 % IV SOLN: INTRAVENOUS | Status: DC

## 2024-05-08 MED ORDER — SODIUM CHLORIDE 0.9 % IV SOLN
5.0000 mg/kg | Freq: Once | INTRAVENOUS | Status: AC
  Administered 2024-05-08: 300 mg via INTRAVENOUS
  Filled 2024-05-08: qty 12

## 2024-05-08 NOTE — Patient Instructions (Signed)
 [facility] - A DEPT OF [facility]  Discharge Instructions: Thank you for choosing [facility] to provide your oncology and hematology care.   If you have a lab appointment with the Cancer Center, please go directly to the Cancer Center and check in at the registration area.   Wear comfortable clothing and clothing appropriate for easy access to any Portacath or PICC line.   We strive to give you quality time with your provider. You may need to reschedule your appointment if you arrive late (15 or more minutes).  Arriving late affects you and other patients whose appointments are after yours.  Also, if you miss three or more appointments without notifying the office, you may be dismissed from the clinic at the provider's discretion.      For prescription refill requests, have your pharmacy contact our office and allow 72 hours for refills to be completed.    Today you received the following chemotherapy and/or immunotherapy agents Avastin       To help prevent nausea and vomiting after your treatment, we encourage you to take your nausea medication as directed.  BELOW ARE SYMPTOMS THAT SHOULD BE REPORTED IMMEDIATELY: *FEVER GREATER THAN 100.4 F (38 C) OR HIGHER *CHILLS OR SWEATING *NAUSEA AND VOMITING THAT IS NOT CONTROLLED WITH YOUR NAUSEA MEDICATION *UNUSUAL SHORTNESS OF BREATH *UNUSUAL BRUISING OR BLEEDING *URINARY PROBLEMS (pain or burning when urinating, or frequent urination) *BOWEL PROBLEMS (unusual diarrhea, constipation, pain near the anus) TENDERNESS IN MOUTH AND THROAT WITH OR WITHOUT PRESENCE OF ULCERS (sore throat, sores in mouth, or a toothache) UNUSUAL RASH, SWELLING OR PAIN  UNUSUAL VAGINAL DISCHARGE OR ITCHING   Items with * indicate a potential emergency and should be followed up as soon as possible or go to the Emergency Department if any problems should occur.  Please show the CHEMOTHERAPY ALERT CARD or IMMUNOTHERAPY  ALERT CARD at check-in to the Emergency Department and triage nurse.  Should you have questions after your visit or need to cancel or reschedule your appointment, please contact [facility] - A DEPT OF [facility]  Dept: [phone]  and follow the prompts.  Office hours are 8:00 a.m. to 4:30 p.m. Monday - Friday. Please note that voicemails left after 4:00 p.m. may not be returned until the following business day.  We are closed weekends and major holidays. You have access to a nurse at all times for urgent questions. Please call the main number to the clinic Dept: [phone] and follow the prompts.   For any non-urgent questions, you may also contact your provider using MyChart. We now offer e-Visits for anyone 30 and older to request care online for non-urgent symptoms. For details visit [email or web address].   Also download the MyChart app! Go to the app store, search MyChart, open the app, select , and log in with your MyChart username and password.

## 2024-05-08 NOTE — Assessment & Plan Note (Signed)
 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  -Treatment course: began first line systemic chemotherapy with 5FU/leuc and bevacizumab  on 10/20/21. Restaging scans showed progression and Oxaliplatin  was added with cycle 7 on 01/21/22. She continued FOLFOX/Beva x12 cycles, oxali was removed with cycle 18 on 09/07/22. She continued 5FU/Beva for 4 additional cycles. She completed cycle 22 on 11/20/22 prior to moving to [place]. At [facility] she received additional cycles of 5FU/Beva through November 2024.  -CT 01/25/2024 showed disease progression in liver, and 2 new lung nodule, up to 1.1 cm, highly concerning for metastasis. -I recommended Xeloda  and Beva in May 2025, she wants to think about it  she finally started on 04/24/2024

## 2024-05-08 NOTE — Progress Notes (Signed)
 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  -Treatment course: began first line systemic chemotherapy with 5FU/leuc and bevacizumab  on 10/20/21. Restaging scans showed progression and Oxaliplatin  was added with cycle 7 on 01/21/22. She continued FOLFOX/Beva x12 cycles, oxali was removed with cycle 18 on 09/07/22. She continued 5FU/Beva for 4 additional cycles. She completed cycle 22 on 11/20/22 prior to moving to [place]. At [facility] she received additional cycles of 5FU/Beva through November 2024.  -CT 01/25/2024 showed disease progression in liver, and 2 new lung nodule, up to 1.1 cm, highly concerning for metastasis. -I recommended Xeloda  and Beva in May 2025, she wants to think about it  she finally started on 04/24/2024  Assessment & Plan Metastatic colon cancer Currently on oral chemotherapy with a regimen of one week on and one week off. Experiencing increased urinary incontinence and fatigue, potentially related to the chemotherapy. No issues reported during the off week. Scheduled to start bevacizumab  infusion today, a VGFR inhibitor to enhance chemotherapy effects and prolong disease control. Discussed potential side effects of bevacizumab , including hypertension, proteinuria, bleeding, and thromboembolism. Emphasized the balance between disease control and quality of  life, with the option to discontinue treatment if quality of life is significantly impacted. - Reduce oral chemotherapy dose to 2 pills in the morning and 2 pills at night. - Administer bevacizumab  infusion today. - Monitor for side effects of bevacizumab , including hypertension and proteinuria.  Urinary incontinence Increased urinary incontinence since starting the new chemotherapy regimen. No issues during the off week. Symptoms have worsened with current treatment. Fatigue may contribute to muscle weakness and incontinence.  Fatigue Significant fatigue reported since starting the new chemotherapy regimen, with energy levels reduced to approximately 50% of baseline. Symptoms improved during the off week. Discussed the impact of fatigue on quality of life and the possibility of discontinuing chemotherapy if it significantly affects daily activities. - Reduce oral chemotherapy dose to 2 pills in the morning and 2 pills at night. - Monitor energy levels and consider discontinuing chemotherapy if fatigue significantly impacts quality of life.   Plan - Due to his fatigue and urinary incontinence, will reduce the Xarelto to 1000 mg twice daily, and continue 7 days on and 7 days off - Will proceed to bevacizumab  today and continue every 3 weeks - Follow-up in 3 weeks  SUMMARY OF ONCOLOGIC HISTORY: Oncology History  Metastatic colon cancer to liver (HCC)  08/21/2021 Imaging   CT abdomen pelvis with contrast  showed soft tissue mass at the base of cecum measuring up to 5 cm, slightly increased in size.  Interval development of multiple low-density lesions throughout the liver highly suggestive for metastatic disease.  Complex mass in the region of the vaginal cuff measuring up to 5.2 cm, increased in size.  Right ovary also appears more prominent on the current examination.  Moderate large volume of stool throughout the colon.  Mild urinary bladder wall thickening.  Colonic diverticulosis without  evidence of active diverticulitis.  Aortic atherosclerosis   09/04/2021 Procedure   patient is status post left lobe liver lesion biopsy and pathology showed metastatic adenocarcinoma, compatible with colorectal primary.   09/04/2021 Cancer Staging   Staging form: Colon and Rectum, AJCC 8th Edition - Clinical stage from 09/04/2021: Stage Unknown (cTX, cNX, cM1) - Signed by [name], MD on 05/13/2022 Stage prefix: Initial diagnosis   09/16/2021 Initial Diagnosis   Metastatic colon cancer to liver [facility]) [facility] one liquid biopsy testing showed [id number], APC TP53 TMB 4  Patient initially went to emergency room on 03/24/2021 for abdominal pain. 03/24/2021, CT scan of the abdomen showed marked bladder wall thickening with surrounding soft tissue stranding is noted.  Concerning for cystitis.  There is a lobulated mass between the posterior wall of the bladder and the rectum which appears to arise from the vaginal cuff.  This is indeterminate and difficult to characterize reflecting lack of IV contrast material.  Nonobstructing left renal calculus, left sacroiliitis, lumbar spondylosis aortic atherosclerosis. 03/24/2021 CT pelvic showed abdominal [place]q soft tissue of right cecum, concerning for colon carcinoma.  Colonoscopy recommended.  Low-attenuation mass in the region of vaginal cuff is again noted.  Diffuse urinary bladder wall thickening and mild bilateral hydroureter possibly cystitis.   US  03/24/2021 Lobulated solid 3.8 cm mass confirmed by ultrasound. This is most compatible with a neoplasm but remains indeterminate as to the origin as the epicenter seems to be outside of both the vaginal cuff and the adjacent colon, while the lesion appears inseparable from both. Extensive echogenic debris within the distended urinary bladder.   03/26/2021, patient was seen by Dr. Elby for evaluation of colonic/vaginal cuff pelvic mass.  Patient also was referred to gastroenterology for colonoscopy.    03/26/2021 CEA 3.4.  CA125 9.7 04/04/2021, status post colonoscopy which showed a frond like /villous nonobstructing large mass was found in the cecum, this was biopsied.  Terminal ileum was briefly intubated and appeared normal.  Diverticulosis in the sigmoid colon.  The rectum, sigmoid colon, descending colon, transverse colon and ascending colon were normal.  Nonbleeding internal hemorrhoids. 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 Given that the biopsy may not be representative of the entire underlying lesion.  Patient was recommended to establish with surgeon for evaluation. Patient no showed for her surgery appointment and as well as her follow-up appointment with gastroenterology.  Per daughter, patient was having cervical spine surgery and she prioritized that over the colon surgery.   10/20/2021 - 01/07/2022 Chemotherapy    COLORECTAL 5-FU  + Bevacizumab  q14d      10/20/2021 - 11/23/2022 Chemotherapy   Patient is on Treatment Plan : COLORECTAL 5FU + Leucovorin  (Modified DeGramont) + Bevacizumab  q14d     12/22/2021 Imaging   CT chest abdomen pelvis Decreased cecum mass, however increased size and size of metastatic lesions throughout the liver.  Interval enlargement of vaginal cuff mass. No mets in chest.    01/21/2022 -  Chemotherapy   COLORECTAL FOLFOX  + Bevacizumab  q14d    patient was seen by gynecology oncology Dr. Mancil.  Biopsy of the vaginal mass was not recommended.Patient's case was also discussed on multidisciplinary tumor board.  IR potentially can try a biopsy if patient is willing to.I had a discussion with patient's daughter over the phone prior to this visit.  We discussed about option of adding oxaliplatin  to 5-FU/bevacizumab  and continue treatment of colon cancer, repeat CT  scan short-term and if progression, repeat biopsy versus proceeding with vaginal mass biopsy.  Daughter prefers proceeding with chemotherapy and defer biopsy.   01/08/2022  patient has had a second opinion at [facility] and was seen by Dr. Brenna .  Dr. Brenna agrees with the current treatment plan with FOLFOX/bevacizumab .  He has ordered guardant 360 to see if she may be eligible for clinical trial in the future.  -   04/13/2022 Imaging    CT chest abdomen pelvis showed a cecal mass with stable hepatic metastasis.  Mixed cystic and solid mass in the region of the vaginal cuff, new area of patchy groundglass and consolidation in the right upper lobe, lingula and left lower lobe.  Likely due to pneumonia.  Hepatic steatosis.  Bilateral renal stones.  Aortic atherosclerosis   06/24/2022 Imaging   CT abdomen pelvis with contrast showed There are low-density lesions in liver consistent with metastatic disease with no significant change. Wall thickening in the cecum appears less prominent. There is 5.5 x 4.2 cm mixed density lesion in vaginal cuff suggesting possible metastatic disease.   There is no evidence of intestinal obstruction or pneumoperitoneum.There is no hydronephrosis.   Small patchy infiltrates are seen in the lower lung fields suggesting atelectasis/pneumonia.   Small bilateral renal stones. Diverticulosis of colon without signs of diverticulitis. 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 There is encroachment of neural foramina at L4-L5 and L5-S1 levels.     06/25/2022 Imaging   CT soft tissue neck with contrast showed1No acute process in the neck to explain the patient's difficulty swallowing.  CT chest contrast showed 1. Patchy areas of airspace opacity in the right upper lobe and left lower lobe and lingula slightly progressed since the prior radiograph and may represent worsening or recurrent pneumonia. Clinical correlation and follow-up to resolution recommended. 2. Aortic Atherosclerosis    10/14/2022 Imaging   CT chest abdomen pelvis with contrast showed Stable small hypovascular liver metastases.  Stable mass in pelvic  cul-de-sac involving the vaginal cuff,consistent with metastatic disease.  No new or progressive metastatic disease within the chest, abdomen,or pelvis. Stable bilateral bilateral airspace disease, suspicious for pneumonia or inflammatory etiology.   05/08/2024 -  Chemotherapy   Patient is on Treatment Plan : COLORECTAL Bevacizumab  q14d        Discussed the use of AI scribe software for clinical note transcription with the patient, who gave verbal consent to proceed.  History of Present Illness Chelsea [name] is a 78 year old female with metastatic colon cancer who presents for follow-up. She is accompanied by her daughter.  She is on an oral chemotherapy regimen started on July 21st, taken daily for one week with a week off. She initially missed a morning dose on the first day but has been compliant since.  She experiences increased urinary frequency, incontinence, and dysuria since starting chemotherapy, similar to past experiences with other chemotherapy medications.  She reports significant fatigue, with her energy level at fifty percent of her baseline, though she continues most activities with reduced energy.  She lives independently with a helper visiting three times a week, and her daughter provides additional support from across the street.     All other systems were reviewed with the patient and are negative.  MEDICAL HISTORY:  Past Medical History:  Diagnosis Date   Anxiety    Arthritis    Depression    Hyperlipidemia    Hypertension    Liver mass  Metastatic colon cancer to liver (HCC)    Moderate mitral insufficiency    Port-A-Cath in place    Seizures [facility])    Spinal stenosis    Ulcer    Urinary incontinence     SURGICAL HISTORY: Past Surgical History:  Procedure Laterality Date   ABDOMINAL HYSTERECTOMY     BACK SURGERY     due to polio   BREAST BIOPSY Bilateral    neg   BREAST BIOPSY Right 2011   neg/stereo   CARPAL TUNNEL RELEASE      CATARACT EXTRACTION Right 2020   COLONOSCOPY WITH PROPOFOL  N/A 04/04/2021   Procedure: COLONOSCOPY WITH PROPOFOL ;  Surgeon: Janalyn Keene NOVAK, MD;  Location: [facility] ENDOSCOPY;  Service: Endoscopy;  Laterality: N/A;   ESOPHAGOGASTRODUODENOSCOPY (EGD) WITH PROPOFOL  N/A 06/25/2022   Procedure: ESOPHAGOGASTRODUODENOSCOPY (EGD) WITH PROPOFOL ;  Surgeon: Unk Corinn Skiff, MD;  Location: [facility] ENDOSCOPY;  Service: Gastroenterology;  Laterality: N/A;   EYE SURGERY     IR IMAGING GUIDED PORT INSERTION  10/09/2021    I have reviewed the social history and family history with the patient and they are unchanged from previous note.  ALLERGIES:  is allergic to penicillins.  MEDICATIONS:  Current Outpatient Medications  Medication Sig Dispense Refill   acetaminophen  (TYLENOL ) 500 MG tablet Take 500 mg by mouth every 6 (six) hours as needed.     ALPRAZolam  (XANAX ) 0.5 MG tablet TAKE 1 TABLET BY MOUTH TWICE A DAY AS NEEDED FOR ANXIETY 10.11.23 60 tablet 3   aspirin  EC 81 MG tablet Take 1 tablet (81 mg total) by mouth daily. Swallow whole. 30 tablet 12   atorvastatin  (LIPITOR) 10 MG tablet TAKE 1 TABLET BY MOUTH EVERY DAY 90 tablet 0   busPIRone  (BUSPAR ) 5 MG tablet TAKE 1 TABLET BY MOUTH THREE TIMES A DAY 270 tablet 0   capecitabine  (XELODA ) 500 MG tablet Take 2 tablets (1000 mg total) by mouth in morning and 3 tablets (1500 mg total) by mouth in evening, every 10-12 hours. Take within 30 minutes after meals. Take for 7 days on, then 7 days off. Repeat every 14 days. 35 tablet 1   Cyanocobalamin  (VITAMIN B 12 PO) Take 500 mcg by mouth daily.     diclofenac  Sodium (VOLTAREN ) 1 % GEL Apply 4 g topically 4 (four) times daily. 4 g 0   docusate sodium  (COLACE) 100 MG capsule Take 1 capsule (100 mg total) by mouth 2 (two) times daily. 60 capsule 0   FLUoxetine  (PROZAC ) 20 MG capsule TAKE 3 CAPSULES (60 MG TOTAL) BY MOUTH EVERY MORNING. 270 capsule 0   gabapentin  (NEURONTIN ) 300 MG capsule TAKE 1 CAPSULE BY MOUTH  THREE TIMES A DAY 90 capsule 3   lidocaine -prilocaine  (EMLA ) cream Apply small amount of cream to port site 1-2 hour prior to chemo treatment. 30 g 6   lidocaine -prilocaine  (EMLA ) cream Apply to affected area once 30 g 3   loperamide  (IMODIUM ) 2 MG capsule TAKE 1 CAP BY MOUTH SEE ADMIN INSTRUCTIONS. INITIAL: 4 MG, FOLLOWED BY 2 MG AFTER EACH LOOSE STOOL MAXIMUM: 16 MG/DAY 60 capsule 0   loratadine  (CLARITIN ) 10 MG tablet Take 1 tablet (10 mg total) by mouth See admin instructions. Take 1 tablet daily for 4 days after each chemotherapy treatments. 90 tablet 0   magic mouthwash w/lidocaine  SOLN Take 5 mLs by mouth 4 (four) times daily as needed for mouth pain. Sig: Swish/Swallow 5-10 ml four times a day as needed. Dispense 480 ml. 1RF 480 mL 1  meclizine  (ANTIVERT ) 12.5 MG tablet Take 1 tablet (12.5 mg total) by mouth 3 (three) times daily as needed for dizziness. 30 tablet 0   methocarbamol  (ROBAXIN ) 500 MG tablet Take 1 tablet (500 mg total) by mouth 2 (two) times daily as needed for muscle spasms. 180 tablet 0   ondansetron  (ZOFRAN ) 8 MG tablet Take 1 tablet (8 mg total) by mouth 2 (two) times daily as needed (Nausea or vomiting). 30 tablet 1   polyethylene glycol powder (GLYCOLAX /MIRALAX ) 17 GM/SCOOP powder Take 17 g by mouth daily. 3350 g 1   potassium chloride  SA (KLOR-CON  M) 20 MEQ tablet TAKE 1 TABLET EVERY DAY 90 tablet 3   triamterene -hydrochlorothiazide  (MAXZIDE -25) 37.5-25 MG tablet TAKE 1 TABLET EVERY DAY 90 tablet 3   No current facility-administered medications for this visit.   Facility-Administered Medications Ordered in Other Visits  Medication Dose Route Frequency Provider Last Rate Last Admin   heparin  lock flush 100 UNIT/ML injection            palonosetron  (ALOXI ) 0.25 MG/5ML injection            prochlorperazine  (COMPAZINE ) 10 MG tablet             PHYSICAL EXAMINATION: ECOG PERFORMANCE STATUS: 2 - Symptomatic, <50% confined to bed  Vitals:   05/08/24 1118  BP: 136/86   Pulse: 68  Resp: 15  Temp: 98.3 F (36.8 C)  SpO2: 98%   Wt Readings from Last 3 Encounters:  05/08/24 143 lb 9.6 oz (65.1 kg)  04/11/24 137 lb (62.1 kg)  02/07/24 139 lb 3.2 oz (63.1 kg)     GENERAL:alert, no distress and comfortable SKIN: skin color, texture, turgor are normal, no rashes or significant lesions EYES: normal, Conjunctiva are pink and non-injected, sclera clear NECK: supple, thyroid normal size, non-tender, without nodularity LYMPH:  no palpable lymphadenopathy in the cervical, axillary  LUNGS: clear to auscultation and percussion with normal breathing effort HEART: regular rate & rhythm and no murmurs and no lower extremity edema ABDOMEN:abdomen soft, non-tender and normal bowel sounds Musculoskeletal:no cyanosis of digits and no clubbing  NEURO: alert & oriented x 3 with fluent speech, no focal motor/sensory deficits  Physical Exam   LABORATORY DATA:  I have reviewed the data as listed    Latest Ref Rng & Units 05/08/2024   10:43 AM 04/11/2024    1:24 PM 02/07/2024    3:18 PM  CBC  WBC 4.0 - 10.5 K/uL 4.4  4.1  3.9   Hemoglobin 12.0 - 15.0 g/dL 89.4  88.2  88.8   Hematocrit 36.0 - 46.0 % 33.0  37.5  34.0   Platelets 150 - 400 K/uL 164  148  137         Latest Ref Rng & Units 05/08/2024   10:43 AM 04/11/2024    1:24 PM 02/07/2024    3:18 PM  CMP  Glucose 70 - 99 mg/dL 898  94  89   BUN 8 - 23 mg/dL 16  15  17    Creatinine 0.44 - 1.00 mg/dL 8.83  9.12  9.30   Sodium 135 - 145 mmol/L 140  142  140   Potassium 3.5 - 5.1 mmol/L 3.7  3.7  3.8   Chloride 98 - 111 mmol/L 102  103  104   CO2 22 - 32 mmol/L 33  35  33   Calcium  8.9 - 10.3 mg/dL 9.2  9.5  9.3   Total Protein 6.5 - 8.1 g/dL 7.1  7.1  7.3   Total Bilirubin 0.0 - 1.2 mg/dL 0.5  0.6  0.4   Alkaline Phos 38 - 126 U/L 201  316  118   AST 15 - 41 U/L 31  102  33   ALT 0 - 44 U/L 22  105  36       RADIOGRAPHIC STUDIES: I have personally reviewed the radiological images as listed and agreed with  the findings in the report. No results found.    No orders of the defined types were placed in this encounter.  All questions were answered. The patient knows to call the clinic with any problems, questions or concerns. No barriers to learning was detected. The total time spent in the appointment was 30 minutes, including review of chart and various tests results, discussions about plan of care and coordination of care plan     Onita Mattock, MD 05/08/2024

## 2024-05-08 NOTE — Progress Notes (Signed)
 PATIENT NAVIGATOR PROGRESS NOTE  Name: Chelsea [name] Date: 05/08/2024 MRN: [id number]  DOB: 1946-07-24   Patient is established with a treatment plan and is actively engaged in care. Nurse Navigator services not currently indicated at this time. Will re-evaluate if needs change or if additional support is requested.

## 2024-05-11 ENCOUNTER — Other Ambulatory Visit: Payer: Self-pay

## 2024-05-16 ENCOUNTER — Other Ambulatory Visit: Payer: Self-pay

## 2024-05-16 ENCOUNTER — Encounter: Payer: Self-pay | Admitting: Pharmacist

## 2024-05-19 ENCOUNTER — Other Ambulatory Visit: Payer: Self-pay

## 2024-05-22 ENCOUNTER — Other Ambulatory Visit (HOSPITAL_COMMUNITY): Payer: Self-pay

## 2024-05-22 ENCOUNTER — Other Ambulatory Visit: Payer: Self-pay

## 2024-05-22 ENCOUNTER — Other Ambulatory Visit: Payer: Self-pay | Admitting: Hematology

## 2024-05-22 ENCOUNTER — Inpatient Hospital Stay: Payer: Medicare (Managed Care)

## 2024-05-22 ENCOUNTER — Inpatient Hospital Stay (HOSPITAL_BASED_OUTPATIENT_CLINIC_OR_DEPARTMENT_OTHER): Payer: Medicare (Managed Care) | Admitting: Hematology

## 2024-05-22 VITALS — BP 132/70 | HR 62 | Temp 98.2°F | Resp 16 | Ht <= 58 in | Wt 142.0 lb

## 2024-05-22 VITALS — BP 141/65 | HR 57 | Temp 98.1°F | Resp 18

## 2024-05-22 DIAGNOSIS — C787 Secondary malignant neoplasm of liver and intrahepatic bile duct: Secondary | ICD-10-CM

## 2024-05-22 DIAGNOSIS — D702 Other drug-induced agranulocytosis: Secondary | ICD-10-CM

## 2024-05-22 DIAGNOSIS — C189 Malignant neoplasm of colon, unspecified: Secondary | ICD-10-CM

## 2024-05-22 DIAGNOSIS — R300 Dysuria: Secondary | ICD-10-CM

## 2024-05-22 DIAGNOSIS — Z5112 Encounter for antineoplastic immunotherapy: Secondary | ICD-10-CM | POA: Diagnosis not present

## 2024-05-22 LAB — CMP (CANCER CENTER ONLY)
ALT: 14 U/L (ref 0–44)
AST: 23 U/L (ref 15–41)
Albumin: 3.5 g/dL (ref 3.5–5.0)
Alkaline Phosphatase: 170 U/L — ABNORMAL HIGH (ref 38–126)
Anion gap: 6 (ref 5–15)
BUN: 15 mg/dL (ref 8–23)
CO2: 31 mmol/L (ref 22–32)
Calcium: 9 mg/dL (ref 8.9–10.3)
Chloride: 104 mmol/L (ref 98–111)
Creatinine: 0.79 mg/dL (ref 0.44–1.00)
GFR, Estimated: >60 mL/min (ref >60)
Glucose, Bld: 127 mg/dL — ABNORMAL HIGH (ref 70–99)
Potassium: 3.3 mmol/L — ABNORMAL LOW (ref 3.5–5.1)
Sodium: 141 mmol/L (ref 135–145)
Total Bilirubin: 0.4 mg/dL (ref 0.0–1.2)
Total Protein: 6.9 g/dL (ref 6.5–8.1)

## 2024-05-22 LAB — CBC WITH DIFFERENTIAL (CANCER CENTER ONLY)
Abs Immature Granulocytes: 0.01 K/uL (ref 0.00–0.07)
Basophils Absolute: 0 K/uL (ref 0.0–0.1)
Basophils Relative: 1 %
Eosinophils Absolute: 0.1 K/uL (ref 0.0–0.5)
Eosinophils Relative: 2 %
HCT: 31.8 % — ABNORMAL LOW (ref 36.0–46.0)
Hemoglobin: 10.1 g/dL — ABNORMAL LOW (ref 12.0–15.0)
Immature Granulocytes: 0 %
Lymphocytes Relative: 34 %
Lymphs Abs: 1.3 K/uL (ref 0.7–4.0)
MCH: 30.7 pg (ref 26.0–34.0)
MCHC: 31.8 g/dL (ref 30.0–36.0)
MCV: 96.7 fL (ref 80.0–100.0)
Monocytes Absolute: 0.4 K/uL (ref 0.1–1.0)
Monocytes Relative: 12 %
Neutro Abs: 1.8 K/uL (ref 1.7–7.7)
Neutrophils Relative %: 51 %
Platelet Count: 146 K/uL — ABNORMAL LOW (ref 150–400)
RBC: 3.29 MIL/uL — ABNORMAL LOW (ref 3.87–5.11)
RDW: 15.9 % — ABNORMAL HIGH (ref 11.5–15.5)
WBC Count: 3.6 K/uL — ABNORMAL LOW (ref 4.0–10.5)
nRBC: 0 % (ref 0.0–0.2)

## 2024-05-22 LAB — TOTAL PROTEIN, URINE DIPSTICK: Protein, ur: NEGATIVE mg/dL

## 2024-05-22 MED ORDER — SODIUM CHLORIDE 0.9 % IV SOLN
5.0000 mg/kg | Freq: Once | INTRAVENOUS | Status: AC
  Administered 2024-05-22: 300 mg via INTRAVENOUS
  Filled 2024-05-22: qty 12

## 2024-05-22 MED ORDER — SODIUM CHLORIDE 0.9% FLUSH
10.0000 mL | Freq: Once | INTRAVENOUS | Status: AC
Start: 2024-05-22 — End: 2024-05-22
  Administered 2024-05-22: 10 mL

## 2024-05-22 MED ORDER — SODIUM CHLORIDE 0.9% FLUSH
10.0000 mL | INTRAVENOUS | Status: DC | PRN
  Administered 2024-05-22: 10 mL

## 2024-05-22 MED ORDER — SODIUM CHLORIDE 0.9 % IV SOLN: INTRAVENOUS | Status: DC

## 2024-05-22 NOTE — Assessment & Plan Note (Signed)
 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  -Treatment course: began first line systemic chemotherapy with 5FU/leuc and bevacizumab  on 10/20/21. Restaging scans showed progression and Oxaliplatin  was added with cycle 7 on 01/21/22. She continued FOLFOX/Beva x12 cycles, oxali was removed with cycle 18 on 09/07/22. She continued 5FU/Beva for 4 additional cycles. She completed cycle 22 on 11/20/22 prior to moving to [place]. At [facility] she received additional cycles of 5FU/Beva through November 2024.  -CT 01/25/2024 showed disease progression in liver, and 2 new lung nodule, up to 1.1 cm, highly concerning for metastasis. -I recommended Xeloda  and Beva in May 2025, she wants to think about it  she finally started on 04/24/2024

## 2024-05-22 NOTE — Patient Instructions (Signed)
 [facility] - A DEPT OF [facility]  Discharge Instructions: Thank you for choosing [facility] to provide your oncology and hematology care.   If you have a lab appointment with the Cancer Center, please go directly to the Cancer Center and check in at the registration area.   Wear comfortable clothing and clothing appropriate for easy access to any Portacath or PICC line.   We strive to give you quality time with your provider. You may need to reschedule your appointment if you arrive late (15 or more minutes).  Arriving late affects you and other patients whose appointments are after yours.  Also, if you miss three or more appointments without notifying the office, you may be dismissed from the clinic at the provider's discretion.      For prescription refill requests, have your pharmacy contact our office and allow 72 hours for refills to be completed.    Today you received the following chemotherapy and/or immunotherapy agent: Bevacizumab    To help prevent nausea and vomiting after your treatment, we encourage you to take your nausea medication as directed.  BELOW ARE SYMPTOMS THAT SHOULD BE REPORTED IMMEDIATELY: *FEVER GREATER THAN 100.4 F (38 C) OR HIGHER *CHILLS OR SWEATING *NAUSEA AND VOMITING THAT IS NOT CONTROLLED WITH YOUR NAUSEA MEDICATION *UNUSUAL SHORTNESS OF BREATH *UNUSUAL BRUISING OR BLEEDING *URINARY PROBLEMS (pain or burning when urinating, or frequent urination) *BOWEL PROBLEMS (unusual diarrhea, constipation, pain near the anus) TENDERNESS IN MOUTH AND THROAT WITH OR WITHOUT PRESENCE OF ULCERS (sore throat, sores in mouth, or a toothache) UNUSUAL RASH, SWELLING OR PAIN  UNUSUAL VAGINAL DISCHARGE OR ITCHING   Items with * indicate a potential emergency and should be followed up as soon as possible or go to the Emergency Department if any problems should occur.  Please show the CHEMOTHERAPY ALERT CARD or IMMUNOTHERAPY  ALERT CARD at check-in to the Emergency Department and triage nurse.  Should you have questions after your visit or need to cancel or reschedule your appointment, please contact [facility] - A DEPT [facility]  Dept: [phone]  and follow the prompts.  Office hours are 8:00 a.m. to 4:30 p.m. Monday - Friday. Please note that voicemails left after 4:00 p.m. may not be returned until the following business day.  We are closed weekends and major holidays. You have access to a nurse at all times for urgent questions. Please call the main number to the clinic Dept: [phone] and follow the prompts.   For any non-urgent questions, you may also contact your provider using MyChart. We now offer e-Visits for anyone 69 and older to request care online for non-urgent symptoms. For details visit [email or web address].   Also download the MyChart app! Go to the app store, search MyChart, open the app, select [place], and log in with your MyChart username and password.  Hypokalemia Hypokalemia means that the amount of potassium in the blood is lower than normal. Potassium is a mineral (electrolyte) that helps regulate the amount of fluid in the body. It also stimulates muscle tightening (contraction) and helps nerves work properly. Normally, most of the body's potassium is inside cells, and only a very small amount is in the blood. Because the amount in the blood is so small, minor changes to potassium levels in the blood can be life-threatening. What are the causes? This condition may be caused by: Antibiotic medicine. Diarrhea or vomiting.  Taking too much of a medicine that helps you have a bowel movement (laxative) can cause diarrhea and lead to hypokalemia. Chronic kidney disease (CKD). Medicines that help the body get rid of excess fluid (diuretics). Eating disorders, such as anorexia or bulimia. Low magnesium  levels in the body. Sweating a lot. What are  the signs or symptoms? Symptoms of this condition include: Weakness. Constipation. Fatigue. Muscle cramps. Mental confusion. Skipped heartbeats or irregular heartbeat (palpitations). Tingling or numbness. How is this diagnosed? This condition is diagnosed with a blood test. How is this treated? This condition may be treated by: Taking potassium supplements. Adjusting the medicines that you take. Eating more foods that contain a lot of potassium. If your potassium level is very low, you may need to get potassium through an IV and be monitored in the hospital. Follow these instructions at home: Eating and drinking  Eat a healthy diet. A healthy diet includes fresh fruits and vegetables, whole grains, healthy fats, and lean proteins. If told, eat more foods that contain a lot of potassium. These include: Nuts, such as peanuts and pistachios. Seeds, such as sunflower seeds and pumpkin seeds. Peas, lentils, and lima beans. Whole grain and bran cereals and breads. Fresh fruits and vegetables, such as apricots, avocado, bananas, cantaloupe, kiwi, oranges, tomatoes, asparagus, and potatoes. Juices, such as orange, tomato, and prune. Lean meats, including fish. Milk and milk products, such as yogurt. General instructions Take over-the-counter and prescription medicines only as told by your health care provider. This includes vitamins, natural food products, and supplements. Keep all follow-up visits. This is important. Contact a health care provider if: You have weakness that gets worse. You feel your heart pounding or racing. You vomit. You have diarrhea. You have diabetes and you have trouble keeping your blood sugar in your target range. Get help right away if: You have chest pain. You have shortness of breath. You have vomiting or diarrhea that lasts for more than 2 days. You faint. These symptoms may be an emergency. Get help right away. Call 911. Do not wait to see if the  symptoms will go away. Do not drive yourself to the hospital. Summary Hypokalemia means that the amount of potassium in the blood is lower than normal. This condition is diagnosed with a blood test. Hypokalemia may be treated by taking potassium supplements, adjusting the medicines that you take, or eating more foods that are high in potassium. If your potassium level is very low, you may need to get potassium through an IV and be monitored in the hospital. This information is not intended to replace advice given to you by your health care provider. Make sure you discuss any questions you have with your health care provider. Document Revised: 06/05/2021 Document Reviewed: 06/05/2021 Elsevier Patient Education  2024 [employer].

## 2024-05-22 NOTE — Progress Notes (Signed)
 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  -Treatment course: began first line systemic chemotherapy with 5FU/leuc and bevacizumab  on 10/20/21. Restaging scans showed progression and Oxaliplatin  was added with cycle 7 on 01/21/22. She continued FOLFOX/Beva x12 cycles, oxali was removed with cycle 18 on 09/07/22. She continued 5FU/Beva for 4 additional cycles. She completed cycle 22 on 11/20/22 prior to moving to [place]. At [name] she received additional cycles of 5FU/Beva through November 2024.  -CT 01/25/2024 showed disease progression in liver, and 2 new lung nodule, up to 1.1 cm, highly concerning for metastasis. -I recommended Xeloda  and Beva in May 2025, she wants to think about it  she finally started on 04/24/2024  Assessment & Plan Metastatic colon cancer Metastatic colon cancer, currently on oral chemotherapy (two pills in the morning and two in the evening). CT scan in July showed no cancer progression. The goal is to control the disease and prolong life, balancing treatment side effects with quality of life. She desires to feel good and is willing to continue medication to prevent worsening. Without treatment, symptoms such as fatigue, loss of appetite, and pain may occur, and chemotherapy may not be an option if the body becomes too weak. - Continue current oral chemotherapy regimen. - Administer infusion  today at 12:00 PM. - Schedule next infusion in September, maintaining every two weeks schedule. - Monitor tumor marker CEA levels. - Repeat CT scan in October to assess disease progression. - Discuss treatment options and goals of care, balancing quality of life and disease control.  Urinary incontinence and increased urinary frequency Increased urinary frequency and urinary incontinence, possibly related to chemotherapy. She reports frequent urination without pain or burning sensation. A urine sample was provided, but it was not sterile, and a urine culture may be needed to rule out a urinary tract infection (UTI). She believes the symptoms are related to medication rather than a UTI. - Obtain a sterile urine sample for culture to rule out UTI. - Perform urinalysis to check for white blood cells and signs of infection.  Cancer-related fatigue Cancer-related fatigue, possibly exacerbated by chemotherapy. She reports feeling fatigued but manages daily activities. Fatigue is a common symptom of cancer progression and chemotherapy treatment.  Plan - Recommend medical treatment, will proceed to second cycle bevacizumab  today and continue every 2 weeks - She will continue Xeloda  1000 mg twice daily, 7 days on and 7 days off - Lab and follow-up in 2 weeks   SUMMARY OF ONCOLOGIC HISTORY: Oncology History  Metastatic colon cancer to liver (HCC)  08/21/2021 Imaging   CT abdomen pelvis with contrast  showed soft tissue mass at the base of cecum measuring up to 5 cm, slightly increased in size.  Interval development of multiple low-density lesions throughout the liver highly suggestive for metastatic disease.  Complex mass in the region of the vaginal cuff measuring up to 5.2 cm, increased in size.  Right ovary also appears more  prominent on the current examination.  Moderate large volume of stool throughout the colon.  Mild urinary bladder wall thickening.  Colonic diverticulosis without evidence of  active diverticulitis.  Aortic atherosclerosis   09/04/2021 Procedure   patient is status post left lobe liver lesion biopsy and pathology showed metastatic adenocarcinoma, compatible with colorectal primary.   09/04/2021 Cancer Staging   Staging form: Colon and Rectum, AJCC 8th Edition - Clinical stage from 09/04/2021: Stage Unknown (cTX, cNX, cM1) - Signed by Chelsea Call, MD on 05/13/2022 Stage prefix: Initial diagnosis   09/16/2021 Initial Diagnosis   Metastatic colon cancer to liver [name] one liquid biopsy testing showed [id number], APC TP53 TMB 4  Patient initially went to emergency room on 03/24/2021 for abdominal pain. 03/24/2021, CT scan of the abdomen showed marked bladder wall thickening with surrounding soft tissue stranding is noted.  Concerning for cystitis.  There is a lobulated mass between the posterior wall of the bladder and the rectum which appears to arise from the vaginal cuff.  This is indeterminate and difficult to characterize reflecting lack of IV contrast material.  Nonobstructing left renal calculus, left sacroiliitis, lumbar spondylosis aortic atherosclerosis. 03/24/2021 CT pelvic showed abdominal [place]q soft tissue of right cecum, concerning for colon carcinoma.  Colonoscopy recommended.  Low-attenuation mass in the region of vaginal cuff is again noted.  Diffuse urinary bladder wall thickening and mild bilateral hydroureter possibly cystitis.   US  03/24/2021 Lobulated solid 3.8 cm mass confirmed by ultrasound. This is most compatible with a neoplasm but remains indeterminate as to the origin as the epicenter seems to be outside of both the vaginal cuff and the adjacent colon, while the lesion appears inseparable from both. Extensive echogenic debris within the distended urinary bladder.   03/26/2021, patient was seen by Dr. Elby for evaluation of colonic/vaginal cuff pelvic mass.  Patient also was referred to gastroenterology for colonoscopy.   03/26/2021 CEA 3.4.   CA125 9.7 04/04/2021, status post colonoscopy which showed a frond like /villous nonobstructing large mass was found in the cecum, this was biopsied.  Terminal ileum was briefly intubated and appeared normal.  Diverticulosis in the sigmoid colon.  The rectum, sigmoid colon, descending colon, transverse colon and ascending colon were normal.  Nonbleeding internal hemorrhoids. 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 Given that the biopsy may not be representative of the entire underlying lesion.  Patient was recommended to establish with surgeon for evaluation. Patient no showed for her surgery appointment and as well as her follow-up appointment with gastroenterology.  Per daughter, patient was having cervical spine surgery and she prioritized that over the colon surgery.   10/20/2021 - 01/07/2022 Chemotherapy    COLORECTAL 5-FU  + Bevacizumab  q14d      10/20/2021 - 11/23/2022 Chemotherapy   Patient is on Treatment Plan : COLORECTAL 5FU + Leucovorin  (Modified DeGramont) + Bevacizumab  q14d     12/22/2021 Imaging   CT chest abdomen pelvis Decreased cecum mass, however increased size and size of metastatic lesions throughout the liver.  Interval enlargement of vaginal cuff mass. No mets in chest.    01/21/2022 -  Chemotherapy   COLORECTAL FOLFOX  + Bevacizumab  q14d    patient was seen by gynecology oncology Dr. Mancil.  Biopsy of the vaginal mass was not recommended.Patient's case was also discussed on multidisciplinary tumor board.  IR potentially can try a biopsy if patient is willing to.I had a discussion with patient's daughter over the phone prior to this visit.  We discussed  about option of adding oxaliplatin  to 5-FU/bevacizumab  and continue treatment of colon cancer, repeat CT scan short-term and if progression, repeat biopsy versus proceeding with vaginal mass biopsy.  Daughter prefers proceeding with chemotherapy and defer biopsy.   01/08/2022 patient has had a second  opinion at [facility] and was seen by Dr. Brenna .  Dr. Brenna agrees with the current treatment plan with FOLFOX/bevacizumab .  He has ordered guardant 360 to see if she may be eligible for clinical trial in the future.  -   04/13/2022 Imaging    CT chest abdomen pelvis showed a cecal mass with stable hepatic metastasis.  Mixed cystic and solid mass in the region of the vaginal cuff, new area of patchy groundglass and consolidation in the right upper lobe, lingula and left lower lobe.  Likely due to pneumonia.  Hepatic steatosis.  Bilateral renal stones.  Aortic atherosclerosis   06/24/2022 Imaging   CT abdomen pelvis with contrast showed There are low-density lesions in liver consistent with metastatic disease with no significant change. Wall thickening in the cecum appears less prominent. There is 5.5 x 4.2 cm mixed density lesion in vaginal cuff suggesting possible metastatic disease.   There is no evidence of intestinal obstruction or pneumoperitoneum.There is no hydronephrosis.   Small patchy infiltrates are seen in the lower lung fields suggesting atelectasis/pneumonia.   Small bilateral renal stones. Diverticulosis of colon without signs of diverticulitis. 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 There is encroachment of neural foramina at L4-L5 and L5-S1 levels.     06/25/2022 Imaging   CT soft tissue neck with contrast showed1No acute process in the neck to explain the patient's difficulty swallowing.  CT chest contrast showed 1. Patchy areas of airspace opacity in the right upper lobe and left lower lobe and lingula slightly progressed since the prior radiograph and may represent worsening or recurrent pneumonia. Clinical correlation and follow-up to resolution recommended. 2. Aortic Atherosclerosis    10/14/2022 Imaging   CT chest abdomen pelvis with contrast showed Stable small hypovascular liver metastases.  Stable mass in pelvic cul-de-sac involving the  vaginal cuff,consistent with metastatic disease.  No new or progressive metastatic disease within the chest, abdomen,or pelvis. Stable bilateral bilateral airspace disease, suspicious for pneumonia or inflammatory etiology.   05/08/2024 -  Chemotherapy   Patient is on Treatment Plan : COLORECTAL Bevacizumab  q14d        Discussed the use of AI scribe software for clinical note transcription with the patient, who gave verbal consent to proceed.  History of Present Illness Chelsea [name] is a 78 year old female with metastatic colon cancer who presents for follow-up. She is accompanied by her daughter.  She is currently undergoing treatment with infusion therapy and oral chemotherapy for metastatic colon cancer. Her oral regimen has been adjusted to two pills in the morning and two in the evening, which she tolerates better. She experiences fatigue that affects her mobility. Her weight has increased from 134 pounds to 143 pounds since the last visit, and she maintains a good appetite. She generally feels well and remains active, attending church regularly.  She experiences frequent urination for the past seven days without dysuria. She manages urinary incontinence with pads. She provided a non-sterile urine sample from home and is unsure of any past urinary tract infections.  She has numbness in her hands and feet, attributed to a condition managed by her neurologist. Despite these symptoms, she wishes to maintain her current level of well-being.  All other systems were reviewed with the patient and are negative.  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)    Moderate mitral insufficiency    Port-A-Cath in place    Seizures [facility])    Spinal stenosis    Ulcer    Urinary incontinence     SURGICAL HISTORY: Past Surgical History:   Procedure Laterality Date   ABDOMINAL HYSTERECTOMY     BACK SURGERY     due to polio   BREAST BIOPSY Bilateral    neg   BREAST BIOPSY Right 2011   neg/stereo   CARPAL TUNNEL RELEASE     CATARACT EXTRACTION Right 2020   COLONOSCOPY WITH PROPOFOL  N/A 04/04/2021   Procedure: COLONOSCOPY WITH PROPOFOL ;  Surgeon: Janalyn Keene NOVAK, MD;  Location: [facility] ENDOSCOPY;  Service: Endoscopy;  Laterality: N/A;   ESOPHAGOGASTRODUODENOSCOPY (EGD) WITH PROPOFOL  N/A 06/25/2022   Procedure: ESOPHAGOGASTRODUODENOSCOPY (EGD) WITH PROPOFOL ;  Surgeon: Unk Corinn Skiff, MD;  Location: [facility] ENDOSCOPY;  Service: Gastroenterology;  Laterality: N/A;   EYE SURGERY     IR IMAGING GUIDED PORT INSERTION  10/09/2021    I have reviewed the social history and family history with the patient and they are unchanged from previous note.  ALLERGIES:  is allergic to penicillins.  MEDICATIONS:  Current Outpatient Medications  Medication Sig Dispense Refill   acetaminophen  (TYLENOL ) 500 MG tablet Take 500 mg by mouth every 6 (six) hours as needed.     ALPRAZolam  (XANAX ) 0.5 MG tablet TAKE 1 TABLET BY MOUTH TWICE A DAY AS NEEDED FOR ANXIETY 10.11.23 60 tablet 3   aspirin  EC 81 MG tablet Take 1 tablet (81 mg total) by mouth daily. Swallow whole. 30 tablet 12   atorvastatin  (LIPITOR) 10 MG tablet TAKE 1 TABLET BY MOUTH EVERY DAY 90 tablet 0   busPIRone  (BUSPAR ) 5 MG tablet TAKE 1 TABLET BY MOUTH THREE TIMES A DAY 270 tablet 0   capecitabine  (XELODA ) 500 MG tablet Take 2 tablets (1000 mg total) by mouth in morning and 3 tablets (1500 mg total) by mouth in evening, every 10-12 hours. Take within 30 minutes after meals. Take for 7 days on, then 7 days off. Repeat every 14 days. 35 tablet 1   Cyanocobalamin  (VITAMIN B 12 PO) Take 500 mcg by mouth daily.     diclofenac  Sodium (VOLTAREN ) 1 % GEL Apply 4 g topically 4 (four) times daily. 4 g 0   docusate sodium  (COLACE) 100 MG capsule Take 1 capsule (100 mg total) by mouth 2 (two)  times daily. 60 capsule 0   FLUoxetine  (PROZAC ) 20 MG capsule TAKE 3 CAPSULES (60 MG TOTAL) BY MOUTH EVERY MORNING. 270 capsule 0   gabapentin  (NEURONTIN ) 300 MG capsule TAKE 1 CAPSULE BY MOUTH THREE TIMES A DAY 90 capsule 3   lidocaine -prilocaine  (EMLA ) cream Apply small amount of cream to port site 1-2 hour prior to chemo treatment. 30 g 6   lidocaine -prilocaine  (EMLA ) cream Apply to affected area once 30 g 3   loperamide  (IMODIUM ) 2 MG capsule TAKE 1 CAP BY MOUTH SEE ADMIN INSTRUCTIONS. INITIAL: 4 MG, FOLLOWED BY 2 MG AFTER EACH LOOSE STOOL MAXIMUM: 16 MG/DAY 60 capsule 0   loratadine  (CLARITIN ) 10 MG tablet Take 1 tablet (10 mg total) by mouth See admin instructions. Take 1 tablet daily  for 4 days after each chemotherapy treatments. 90 tablet 0   magic mouthwash w/lidocaine  SOLN Take 5 mLs by mouth 4 (four) times daily as needed for mouth pain. Sig: Swish/Swallow 5-10 ml four times a day as needed. Dispense 480 ml. 1RF 480 mL 1   meclizine  (ANTIVERT ) 12.5 MG tablet Take 1 tablet (12.5 mg total) by mouth 3 (three) times daily as needed for dizziness. 30 tablet 0   methocarbamol  (ROBAXIN ) 500 MG tablet Take 1 tablet (500 mg total) by mouth 2 (two) times daily as needed for muscle spasms. 180 tablet 0   ondansetron  (ZOFRAN ) 8 MG tablet Take 1 tablet (8 mg total) by mouth 2 (two) times daily as needed (Nausea or vomiting). 30 tablet 1   polyethylene glycol powder (GLYCOLAX /MIRALAX ) 17 GM/SCOOP powder Take 17 g by mouth daily. 3350 g 1   potassium chloride  SA (KLOR-CON  M) 20 MEQ tablet TAKE 1 TABLET EVERY DAY 90 tablet 3   triamterene -hydrochlorothiazide  (MAXZIDE -25) 37.5-25 MG tablet TAKE 1 TABLET EVERY DAY 90 tablet 3   No current facility-administered medications for this visit.   Facility-Administered Medications Ordered in Other Visits  Medication Dose Route Frequency Provider Last Rate Last Admin   heparin  lock flush 100 UNIT/ML injection            palonosetron  (ALOXI ) 0.25 MG/5ML injection             prochlorperazine  (COMPAZINE ) 10 MG tablet             PHYSICAL EXAMINATION: ECOG PERFORMANCE STATUS: 2 - Symptomatic, <50% confined to bed  Vitals:   05/22/24 1117  BP: 132/70  Pulse: 62  Resp: 16  Temp: 98.2 F (36.8 C)  SpO2: 96%   Wt Readings from Last 3 Encounters:  05/22/24 142 lb (64.4 kg)  05/08/24 143 lb 9.6 oz (65.1 kg)  04/11/24 137 lb (62.1 kg)     GENERAL:alert, no distress and comfortable SKIN: skin color, texture, turgor are normal, no rashes or significant lesions EYES: normal, Conjunctiva are pink and non-injected, sclera clear Musculoskeletal:no cyanosis of digits and no clubbing  NEURO: alert & oriented x 3 with fluent speech, no focal motor/sensory deficits  Physical Exam   LABORATORY DATA:  I have reviewed the data as listed    Latest Ref Rng & Units 05/22/2024   10:39 AM 05/08/2024   10:43 AM 04/11/2024    1:24 PM  CBC  WBC 4.0 - 10.5 K/uL 3.6  4.4  4.1   Hemoglobin 12.0 - 15.0 g/dL 89.8  89.4  88.2   Hematocrit 36.0 - 46.0 % 31.8  33.0  37.5   Platelets 150 - 400 K/uL 146  164  148         Latest Ref Rng & Units 05/22/2024   10:39 AM 05/08/2024   10:43 AM 04/11/2024    1:24 PM  CMP  Glucose 70 - 99 mg/dL 872  898  94   BUN 8 - 23 mg/dL 15  16  15    Creatinine 0.44 - 1.00 mg/dL 9.20  8.83  9.12   Sodium 135 - 145 mmol/L 141  140  142   Potassium 3.5 - 5.1 mmol/L 3.3  3.7  3.7   Chloride 98 - 111 mmol/L 104  102  103   CO2 22 - 32 mmol/L 31  33  35   Calcium  8.9 - 10.3 mg/dL 9.0  9.2  9.5   Total Protein 6.5 - 8.1 g/dL 6.9  7.1  7.1  Total Bilirubin 0.0 - 1.2 mg/dL 0.4  0.5  0.6   Alkaline Phos 38 - 126 U/L 170  201  316   AST 15 - 41 U/L 23  31  102   ALT 0 - 44 U/L 14  22  105       RADIOGRAPHIC STUDIES: I have personally reviewed the radiological images as listed and agreed with the findings in the report. No results found.    Orders Placed This Encounter  Procedures   Urinalysis, Complete w Microscopic    Standing  Status:   Future    Expected Date:   05/22/2024    Expiration Date:   05/22/2025   CBC with Differential ([name] Only)    Standing Status:   Future    Expected Date:   06/19/2024    Expiration Date:   06/19/2025   All questions were answered. The patient knows to [name] the clinic with any problems, questions or concerns. No barriers to learning was detected. The total time spent in the appointment was 30 minutes, including review of chart and various tests results, discussions about plan of care and coordination of care plan     Onita Mattock, MD 05/22/2024

## 2024-05-24 ENCOUNTER — Other Ambulatory Visit: Payer: Self-pay | Admitting: Hematology

## 2024-05-24 ENCOUNTER — Other Ambulatory Visit: Payer: Self-pay

## 2024-05-24 ENCOUNTER — Other Ambulatory Visit (HOSPITAL_COMMUNITY): Payer: Self-pay

## 2024-05-24 DIAGNOSIS — C189 Malignant neoplasm of colon, unspecified: Secondary | ICD-10-CM

## 2024-05-24 MED ORDER — CAPECITABINE 500 MG PO TABS
ORAL_TABLET | ORAL | 1 refills | Status: DC
  Filled 2024-05-24: qty 35, fill #0
  Filled 2024-05-25: qty 35, 14d supply, fill #0
  Filled 2024-06-07: qty 35, 14d supply, fill #1

## 2024-05-25 ENCOUNTER — Ambulatory Visit (INDEPENDENT_AMBULATORY_CARE_PROVIDER_SITE_OTHER): Payer: Medicare (Managed Care) | Admitting: Internal Medicine

## 2024-05-25 ENCOUNTER — Encounter: Payer: Self-pay | Admitting: Internal Medicine

## 2024-05-25 ENCOUNTER — Other Ambulatory Visit (HOSPITAL_COMMUNITY): Payer: Self-pay

## 2024-05-25 ENCOUNTER — Other Ambulatory Visit: Payer: Self-pay

## 2024-05-25 VITALS — BP 120/78 | HR 75 | Temp 97.9°F | Wt 142.3 lb

## 2024-05-25 DIAGNOSIS — F411 Generalized anxiety disorder: Secondary | ICD-10-CM

## 2024-05-25 DIAGNOSIS — M79602 Pain in left arm: Secondary | ICD-10-CM

## 2024-05-25 NOTE — Progress Notes (Signed)
 Specialty Pharmacy Refill Coordination Note  Chelsea [name] is a 78 y.o. female contacted today regarding refills of specialty medication(s) Capecitabine  (XELODA )   Patient requested [name] at [facility] [place] date: 05/26/24   Medication will be filled on 05/25/24.

## 2024-05-25 NOTE — Progress Notes (Signed)
 Established Patient Office Visit     CC/Reason for Visit: Left upper arm pain  HPI: Chelsea [name] is a 78 y.o. female who is coming in today for the above mentioned reasons.  She received a shingles vaccine in her left arm approximately 3 weeks ago.  She is coming in to see me today because she still has pain at the injection site.  Full range of motion.  No edema or redness.   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)    Moderate mitral insufficiency    Port-A-Cath in place    Seizures [facility])    Spinal stenosis    Ulcer    Urinary incontinence     Past Surgical History:  Procedure Laterality Date   ABDOMINAL HYSTERECTOMY     BACK SURGERY     due to polio   BREAST BIOPSY Bilateral    neg   BREAST BIOPSY Right 2011   neg/stereo   CARPAL TUNNEL RELEASE     CATARACT EXTRACTION Right 2020   COLONOSCOPY WITH PROPOFOL  N/A 04/04/2021   Procedure: COLONOSCOPY WITH PROPOFOL ;  Surgeon: Janalyn Keene NOVAK, MD;  Location: [facility] ENDOSCOPY;  Service: Endoscopy;  Laterality: N/A;   ESOPHAGOGASTRODUODENOSCOPY (EGD) WITH PROPOFOL  N/A 06/25/2022   Procedure: ESOPHAGOGASTRODUODENOSCOPY (EGD) WITH PROPOFOL ;  Surgeon: Unk Corinn Skiff, MD;  Location: [facility] ENDOSCOPY;  Service: Gastroenterology;  Laterality: N/A;   EYE SURGERY     IR IMAGING GUIDED PORT INSERTION  10/09/2021    Social History:  reports that she has never smoked. She has never used smokeless tobacco. She reports that she does not drink alcohol and does not use drugs.  Allergies: Allergies  Allergen Reactions   Penicillins     Everything turned black    Family History:  Family History  Problem Relation Age of Onset   Arthritis Mother    Heart disease Mother    Stroke Mother    Hypertension Mother    COPD Mother    Sudden death Sister    Other  Father        unknown medical history   Breast cancer Neg Hx      Current Outpatient Medications:    acetaminophen  (TYLENOL ) 500 MG tablet, Take 500 mg by mouth every 6 (six) hours as needed., Disp: , Rfl:    ALPRAZolam  (XANAX ) 0.5 MG tablet, TAKE 1 TABLET BY MOUTH TWICE A DAY AS NEEDED FOR ANXIETY 10.11.23, Disp: 60 tablet, Rfl: 3   aspirin  EC 81 MG tablet, Take 1 tablet (81 mg total) by mouth daily. Swallow whole., Disp: 30 tablet, Rfl: 12   atorvastatin  (LIPITOR) 10 MG tablet, TAKE 1 TABLET BY MOUTH EVERY DAY, Disp: 90 tablet, Rfl: 0   busPIRone  (BUSPAR ) 5 MG tablet, TAKE 1 TABLET BY MOUTH THREE TIMES A DAY, Disp: 270 tablet, Rfl: 0   capecitabine  (XELODA ) 500 MG tablet, Take 2 tablets (1000 mg total) by mouth in morning and 3 tablets (1500 mg total) by mouth in evening, every 10-12 hours. Take within 30 minutes after meals. Take for 7 days on, then 7 days off. Repeat every 14 days., Disp: 35 tablet, Rfl: 1   Cyanocobalamin  (VITAMIN B 12 PO), Take 500 mcg by mouth daily., Disp: , Rfl:    diclofenac  Sodium (VOLTAREN ) 1 %  GEL, Apply 4 g topically 4 (four) times daily., Disp: 4 g, Rfl: 0   docusate sodium  (COLACE) 100 MG capsule, Take 1 capsule (100 mg total) by mouth 2 (two) times daily., Disp: 60 capsule, Rfl: 0   FLUoxetine  (PROZAC ) 20 MG capsule, TAKE 3 CAPSULES (60 MG TOTAL) BY MOUTH EVERY MORNING., Disp: 270 capsule, Rfl: 0   gabapentin  (NEURONTIN ) 300 MG capsule, TAKE 1 CAPSULE BY MOUTH THREE TIMES A DAY, Disp: 90 capsule, Rfl: 3   lidocaine -prilocaine  (EMLA ) cream, Apply small amount of cream to port site 1-2 hour prior to chemo treatment., Disp: 30 g, Rfl: 6   lidocaine -prilocaine  (EMLA ) cream, Apply to affected area once, Disp: 30 g, Rfl: 3   loperamide  (IMODIUM ) 2 MG capsule, TAKE 1 CAP BY MOUTH SEE ADMIN INSTRUCTIONS. INITIAL: 4 MG, FOLLOWED BY 2 MG AFTER EACH LOOSE STOOL MAXIMUM: 16 MG/DAY, Disp: 60 capsule, Rfl: 0   loratadine  (CLARITIN ) 10 MG tablet, Take 1 tablet (10 mg total) by  mouth See admin instructions. Take 1 tablet daily for 4 days after each chemotherapy treatments., Disp: 90 tablet, Rfl: 0   magic mouthwash w/lidocaine  SOLN, Take 5 mLs by mouth 4 (four) times daily as needed for mouth pain. Sig: Swish/Swallow 5-10 ml four times a day as needed. Dispense 480 ml. 1RF, Disp: 480 mL, Rfl: 1   meclizine  (ANTIVERT ) 12.5 MG tablet, Take 1 tablet (12.5 mg total) by mouth 3 (three) times daily as needed for dizziness., Disp: 30 tablet, Rfl: 0   methocarbamol  (ROBAXIN ) 500 MG tablet, Take 1 tablet (500 mg total) by mouth 2 (two) times daily as needed for muscle spasms., Disp: 180 tablet, Rfl: 0   ondansetron  (ZOFRAN ) 8 MG tablet, Take 1 tablet (8 mg total) by mouth 2 (two) times daily as needed (Nausea or vomiting)., Disp: 30 tablet, Rfl: 1   polyethylene glycol powder (GLYCOLAX /MIRALAX ) 17 GM/SCOOP powder, Take 17 g by mouth daily., Disp: 3350 g, Rfl: 1   potassium chloride  SA (KLOR-CON  M) 20 MEQ tablet, TAKE 1 TABLET EVERY DAY, Disp: 90 tablet, Rfl: 3   triamterene -hydrochlorothiazide  (MAXZIDE -25) 37.5-25 MG tablet, TAKE 1 TABLET EVERY DAY, Disp: 90 tablet, Rfl: 3 No current facility-administered medications for this visit.  Facility-Administered Medications Ordered in Other Visits:    heparin  lock flush 100 UNIT/ML injection, , , ,    palonosetron  (ALOXI ) 0.25 MG/5ML injection, , , ,    prochlorperazine  (COMPAZINE ) 10 MG tablet, , , ,   Review of Systems:  Negative unless indicated in HPI.   Physical Exam: Vitals:   05/25/24 1001  BP: 120/78  Pulse: 75  Temp: 97.9 F (36.6 C)  TempSrc: Oral  SpO2: 99%  Weight: 142 lb 4.8 oz (64.5 kg)    Body mass index is 29.74 kg/m.   Impression and Plan:  Left arm pain  GAD (generalized anxiety disorder)   - Suspect local injection site reaction from Shingrix.  Advised continued ice, as needed ibuprofen .  Nothing further.  Time spent:22 minutes reviewing chart, interviewing and examining patient and  formulating plan of care.     Chelsea [name], MD [facility] at [facility]

## 2024-06-01 ENCOUNTER — Other Ambulatory Visit: Payer: Self-pay

## 2024-06-02 ENCOUNTER — Other Ambulatory Visit: Payer: Self-pay

## 2024-06-06 ENCOUNTER — Inpatient Hospital Stay: Payer: Medicare (Managed Care)

## 2024-06-06 ENCOUNTER — Telehealth: Payer: Self-pay

## 2024-06-07 ENCOUNTER — Other Ambulatory Visit (HOSPITAL_COMMUNITY): Payer: Self-pay

## 2024-06-07 ENCOUNTER — Other Ambulatory Visit: Payer: Self-pay

## 2024-06-07 NOTE — Progress Notes (Signed)
 Spoke with patient. Patient would like to pick up next supply on 06/09/24. Medication will be filled on 06/08/24. Patient was unsure of when to start her next cycle at time of call, since she did not have her calendar in front of her.

## 2024-06-07 NOTE — Progress Notes (Signed)
 Specialty Pharmacy Refill Coordination Note  Chelsea [name] is a 78 y.o. female contacted today regarding refills of specialty medication(s) Capecitabine  (XELODA )   Patient requested [name] at [facility] [place] date: 06/14/24   Medication will be filled on 09.09.25.

## 2024-06-08 ENCOUNTER — Other Ambulatory Visit: Payer: Self-pay | Admitting: Internal Medicine

## 2024-06-08 DIAGNOSIS — F411 Generalized anxiety disorder: Secondary | ICD-10-CM

## 2024-06-08 NOTE — Telephone Encounter (Signed)
 Copied from CRM [id number]. Topic: Clinical - Medication Refill >> Jun 08, 2024  9:57 AM Maisie C wrote: Medication: xanax    Has the patient contacted their pharmacy? Yes  They told her that it was not refillable until the 10th but she will be out before then.    This is the patient's preferred pharmacy:   [id number] - [place], [place] - [address] AT [address] OF [facility] [address] [place] [address] Phone: [phone] Fax: [phone]   Is this the correct pharmacy for this prescription? Yes If no, delete pharmacy and type the correct one.   Has the prescription been filled recently? Yes  Is the patient out of the medication? No  Has the patient been seen for an appointment in the last year OR does the patient have an upcoming appointment? Yes  Can we respond through MyChart? No  Agent: Please be advised that Rx refills may take up to 3 business days. We ask that you follow-up with your pharmacy.

## 2024-06-12 ENCOUNTER — Telehealth: Payer: Self-pay | Admitting: Pharmacist

## 2024-06-12 MED ORDER — ALPRAZOLAM 0.5 MG PO TABS: ORAL_TABLET | ORAL | 3 refills | Status: DC

## 2024-06-12 NOTE — Telephone Encounter (Signed)
 [facility]       Telephone: [phone]?Fax: [phone]   Oncology Clinical Pharmacist Practitioner Progress Note  Chelsea [name] is a 78 y.o. female with a diagnosis of metastatic colon cancer currently on capecitabine  + bevacizumab  under the care of Dr. Onita Mattock.   I connected with [name] today by telephone and verified that I was speaking with the correct person using two patient identifiers. I discussed the limitations, risks, security and privacy concerns of performing an evaluation and management service by telemedicine and the availability of in-person appointments. The patient/caregiver expressed understanding and agreed to proceed.  Other persons participating in the visit and their role in the encounter: none   Patient's location: home  Provider's location: clinic  Patient advocate Cassie stated patient may want another calendar for her capecitabine  medication. Contacted patient and patient confirmed that she is now taking 2 tabs (1000 mg) in AM and 2 tabs (1000 mg) in the PM 7 days on, 7 days off per Dr. Demetra instructions. She sees Dr. Mattock on Wednesday. Patient also confirmed that she started her new cycle of capecitabine  today and had been off the last 7 days.   Clinical pharmacy will update her current prescription to reflect current dosing and print off calendar for patient to be left with Dr. Demetra nursing team. They have been notified.   [name] participated in the discussion, expressed understanding, and voiced agreement with the above plan. All questions were answered to her satisfaction. The patient was advised to contact the clinic at [phone] with any questions or concerns prior to her return visit.  Clinical pharmacy will continue to support Wanza Y Barbian and Dr. Onita Mattock as needed.  Brevin Mcfadden A. Lucila, PharmD, BCOP, CPP  Norleen DELENA Lucila, RPH-CPP,  06/12/2024  1:18 PM   **Disclaimer: This note was dictated  with voice recognition software. Similar sounding words can inadvertently be transcribed and this note may contain transcription errors which may not have been corrected upon publication of note.**

## 2024-06-14 ENCOUNTER — Inpatient Hospital Stay: Payer: Medicare (Managed Care) | Attending: Nurse Practitioner | Admitting: Hematology

## 2024-06-14 ENCOUNTER — Inpatient Hospital Stay: Payer: Medicare (Managed Care)

## 2024-06-14 DIAGNOSIS — C189 Malignant neoplasm of colon, unspecified: Secondary | ICD-10-CM | POA: Diagnosis not present

## 2024-06-14 DIAGNOSIS — C787 Secondary malignant neoplasm of liver and intrahepatic bile duct: Secondary | ICD-10-CM | POA: Diagnosis present

## 2024-06-14 DIAGNOSIS — R300 Dysuria: Secondary | ICD-10-CM

## 2024-06-14 DIAGNOSIS — C180 Malignant neoplasm of cecum: Secondary | ICD-10-CM | POA: Insufficient documentation

## 2024-06-14 LAB — CMP (CANCER CENTER ONLY)
ALT: 24 U/L (ref 0–44)
AST: 37 U/L (ref 15–41)
Albumin: 3.8 g/dL (ref 3.5–5.0)
Alkaline Phosphatase: 182 U/L — ABNORMAL HIGH (ref 38–126)
Anion gap: 5 (ref 5–15)
BUN: 19 mg/dL (ref 8–23)
CO2: 33 mmol/L — ABNORMAL HIGH (ref 22–32)
Calcium: 9.4 mg/dL (ref 8.9–10.3)
Chloride: 102 mmol/L (ref 98–111)
Creatinine: 0.98 mg/dL (ref 0.44–1.00)
GFR, Estimated: 59 mL/min — ABNORMAL LOW (ref >60)
Glucose, Bld: 98 mg/dL (ref 70–99)
Potassium: 3.6 mmol/L (ref 3.5–5.1)
Sodium: 140 mmol/L (ref 135–145)
Total Bilirubin: 0.5 mg/dL (ref 0.0–1.2)
Total Protein: 7.5 g/dL (ref 6.5–8.1)

## 2024-06-14 LAB — CBC WITH DIFFERENTIAL (CANCER CENTER ONLY)
Abs Immature Granulocytes: 0 K/uL (ref 0.00–0.07)
Basophils Absolute: 0 K/uL (ref 0.0–0.1)
Basophils Relative: 1 %
Eosinophils Absolute: 0.1 K/uL (ref 0.0–0.5)
Eosinophils Relative: 2 %
HCT: 33.2 % — ABNORMAL LOW (ref 36.0–46.0)
Hemoglobin: 11.1 g/dL — ABNORMAL LOW (ref 12.0–15.0)
Immature Granulocytes: 0 %
Lymphocytes Relative: 34 %
Lymphs Abs: 1.4 K/uL (ref 0.7–4.0)
MCH: 32.8 pg (ref 26.0–34.0)
MCHC: 33.4 g/dL (ref 30.0–36.0)
MCV: 98.2 fL (ref 80.0–100.0)
Monocytes Absolute: 0.5 K/uL (ref 0.1–1.0)
Monocytes Relative: 11 %
Neutro Abs: 2.2 K/uL (ref 1.7–7.7)
Neutrophils Relative %: 52 %
Platelet Count: 163 K/uL (ref 150–400)
RBC: 3.38 MIL/uL — ABNORMAL LOW (ref 3.87–5.11)
RDW: 17.2 % — ABNORMAL HIGH (ref 11.5–15.5)
WBC Count: 4.2 K/uL (ref 4.0–10.5)
nRBC: 0 % (ref 0.0–0.2)

## 2024-06-14 LAB — CEA (ACCESS): CEA (CHCC): 74.35 ng/mL — ABNORMAL HIGH (ref 0.00–5.00)

## 2024-06-14 NOTE — Progress Notes (Signed)
 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  -Treatment course: began first line systemic chemotherapy with 5FU/leuc and bevacizumab  on 10/20/21. Restaging scans showed progression and Oxaliplatin  was added with cycle 7 on 01/21/22. She continued FOLFOX/Beva x12 cycles, oxali was removed with cycle 18 on 09/07/22. She continued 5FU/Beva for 4 additional cycles. She completed cycle 22 on 11/20/22 prior to moving to [place]. At [facility] she received additional cycles of 5FU/Beva through November 2024.  -CT 01/25/2024 showed disease progression in liver, and 2 new lung nodule, up to 1.1 cm, highly concerning for metastasis. -I recommended Xeloda  and Beva in May 2025, she wants to think about it  she finally started on 04/24/2024  Assessment & Plan Metastatic colon cancer with poor tolerance to chemotherapy Significant intolerance to chemotherapy, including fatigue, excessive daytime sleepiness, and non-cardiac chest discomfort, negatively impacting quality of life. Symptoms have worsened despite oral chemotherapy for a couple of months. Decision to stop chemotherapy due to poor tolerance and impact on quality of life, with agreement from her and her family. Stopping chemotherapy is expected to improve symptoms over time, but cancer-related symptoms may eventually return. - Stop chemotherapy immediately -  Discuss potential for home care services or hospice care in the future - Cancel upcoming infusion appointment  Fatigue and excessive daytime sleepiness Increased fatigue and excessive daytime sleepiness, sleeping 7-8 hours during the day, likely related to chemotherapy intolerance and cumulative medication effects. Stopping chemotherapy is expected to improve these symptoms over time.  Non-cardiac chest discomfort Intermittent chest discomfort described as pressure and heaviness, mostly at rest, suspected to be related to acid reflux from chemotherapy. Stopping chemotherapy is expected to alleviate these symptoms.  Gait instability Experiencing gait instability with slower walk and tendency to stumble, likely related to overall condition and chemotherapy side effects. No falls have occurred. Stopping chemotherapy may help improve stability. - Advise to be cautious when walking to prevent falls - Consider discussing medication adjustments with primary care physician to reduce polypharmacy  Plan - Due to her poor tolerance to capecitabine , we will stop both capecitabine  and bevacizumab  infusion - Lab and follow-up in a month, will review to see if she wants to involve hospice home care   SUMMARY OF ONCOLOGIC HISTORY: Oncology History  Metastatic colon cancer to liver (HCC)  08/21/2021 Imaging   CT abdomen pelvis with contrast  showed soft tissue mass at the base of cecum measuring up to 5 cm, slightly increased in size.  Interval development of multiple low-density lesions throughout the liver highly suggestive for metastatic disease.  Complex mass in the region of the vaginal cuff measuring up to 5.2 cm, increased in size.  Right ovary also appears more prominent on the current examination.  Moderate large volume of stool throughout the colon.  Mild urinary bladder wall thickening.  Colonic diverticulosis without evidence of active diverticulitis.  Aortic atherosclerosis   09/04/2021 Procedure    patient is status post  left lobe liver lesion biopsy and pathology showed metastatic adenocarcinoma, compatible with colorectal primary.   09/04/2021 Cancer Staging   Staging form: Colon and Rectum, AJCC 8th Edition - Clinical stage from 09/04/2021: Stage Unknown (cTX, cNX, cM1) - Signed by [name], MD on 05/13/2022 Stage prefix: Initial diagnosis   09/16/2021 Initial Diagnosis   Metastatic colon cancer to liver [facility] one liquid biopsy testing showed KRASG12V, APC TP53 TMB 4  Patient initially went to emergency room on 03/24/2021 for abdominal pain. 03/24/2021, CT scan of the abdomen showed marked bladder wall thickening with surrounding soft tissue stranding is noted.  Concerning for cystitis.  There is a lobulated mass between the posterior wall of the bladder and the rectum which appears to arise from the vaginal cuff.  This is indeterminate and difficult to characterize reflecting lack of IV contrast material.  Nonobstructing left renal calculus, left sacroiliitis, lumbar spondylosis aortic atherosclerosis. 03/24/2021 CT pelvic showed abdominal [place] soft tissue of right cecum, concerning for colon carcinoma.  Colonoscopy recommended.  Low-attenuation mass in the region of vaginal cuff is again noted.  Diffuse urinary bladder wall thickening and mild bilateral hydroureter possibly cystitis.   US  03/24/2021 Lobulated solid 3.8 cm mass confirmed by ultrasound. This is most compatible with a neoplasm but remains indeterminate as to the origin as the epicenter seems to be outside of both the vaginal cuff and the adjacent colon, while the lesion appears inseparable from both. Extensive echogenic debris within the distended urinary bladder.   03/26/2021, patient was seen by Dr. Elby for evaluation of colonic/vaginal cuff pelvic mass.  Patient also was referred to gastroenterology for colonoscopy.   03/26/2021 CEA 3.4.  CA125 9.7 04/04/2021, status post colonoscopy which showed a frond like  /villous nonobstructing large mass was found in the cecum, this was biopsied.  Terminal ileum was briefly intubated and appeared normal.  Diverticulosis in the sigmoid colon.  The rectum, sigmoid colon, descending colon, transverse colon and ascending colon were normal.  Nonbleeding internal hemorrhoids. 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 Given that the biopsy may not be representative of the entire underlying lesion.  Patient was recommended to establish with surgeon for evaluation. Patient no showed for her surgery appointment and as well as her follow-up appointment with gastroenterology.  Per daughter, patient was having cervical spine surgery and she prioritized that over the colon surgery.   10/20/2021 - 01/07/2022 Chemotherapy    COLORECTAL 5-FU  + Bevacizumab  q14d      10/20/2021 - 11/23/2022 Chemotherapy   Patient is on Treatment Plan : COLORECTAL 5FU + Leucovorin  (Modified [place]Gramont) + Bevacizumab  q14d     12/22/2021 Imaging   CT chest abdomen pelvis Decreased cecum mass, however increased size and size of metastatic lesions throughout the liver.  Interval enlargement of vaginal cuff mass. No mets in chest.    01/21/2022 -  Chemotherapy   COLORECTAL FOLFOX  + Bevacizumab  q14d    patient was seen by gynecology oncology Dr. Mancil.  Biopsy of the vaginal mass was not recommended.Patient's case was also discussed on multidisciplinary tumor board.  IR potentially can try a biopsy if patient is willing to.I had a discussion with patient's daughter over the phone prior to this visit.  We discussed about option of adding oxaliplatin  to 5-FU/bevacizumab  and continue treatment of colon cancer, repeat CT scan short-term and if progression, repeat biopsy versus proceeding with vaginal mass biopsy.  Daughter prefers proceeding with chemotherapy and defer biopsy.   01/08/2022 patient has  had a second opinion at [facility] and was seen by Dr. Brenna .  Dr. Brenna agrees  with the current treatment plan with FOLFOX/bevacizumab .  He has ordered guardant 360 to see if she may be eligible for clinical trial in the future.  -   04/13/2022 Imaging    CT chest abdomen pelvis showed a cecal mass with stable hepatic metastasis.  Mixed cystic and solid mass in the region of the vaginal cuff, new area of patchy groundglass and consolidation in the right upper lobe, lingula and left lower lobe.  Likely due to pneumonia.  Hepatic steatosis.  Bilateral renal stones.  Aortic atherosclerosis   06/24/2022 Imaging   CT abdomen pelvis with contrast showed There are low-density lesions in liver consistent with metastatic disease with no significant change. Wall thickening in the cecum appears less prominent. There is 5.5 x 4.2 cm mixed density lesion in vaginal cuff suggesting possible metastatic disease.   There is no evidence of intestinal obstruction or pneumoperitoneum.There is no hydronephrosis.   Small patchy infiltrates are seen in the lower lung fields suggesting atelectasis/pneumonia.   Small bilateral renal stones. Diverticulosis of colon without signs of diverticulitis. 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 There is encroachment of neural foramina at L4-L5 and L5-S1 levels.     06/25/2022 Imaging   CT soft tissue neck with contrast showed1No acute process in the neck to explain the patient's difficulty swallowing.  CT chest contrast showed 1. Patchy areas of airspace opacity in the right upper lobe and left lower lobe and lingula slightly progressed since the prior radiograph and may represent worsening or recurrent pneumonia. Clinical correlation and follow-up to resolution recommended. 2. Aortic Atherosclerosis    10/14/2022 Imaging   CT chest abdomen pelvis with contrast showed Stable small hypovascular liver metastases.  Stable mass in pelvic cul-de-sac involving the vaginal cuff,consistent with metastatic disease.  No new or progressive  metastatic disease within the chest, abdomen,or pelvis. Stable bilateral bilateral airspace disease, suspicious for pneumonia or inflammatory etiology.   05/08/2024 -  Chemotherapy   Patient is on Treatment Plan : COLORECTAL Bevacizumab  q14d        Discussed the use of AI scribe software for clinical note transcription with the patient, who gave verbal consent to proceed.  History of Present Illness Chelsea [name] is a 78 year old female with metastatic colon cancer who presents for follow-up.  She experiences increased fatigue and sleeps approximately seven to eight hours during the day. Her gait is slower, and she is closer to stumbling, though she has not fallen. She feels a sensation of pressure in her chest, described as 'heavy', occurring twice daily for about half an hour since Sunday. During these episodes, she requires support to move short distances. She is on her first cycle of oral chemotherapy, which started on Monday. Her medications include Xanax , Robaxin , Gabapentin , Potassium, Maxzide , Imodium , Prozac , Buspar , and Atorvastatin . She lives alone but has support from her daughter and a helper who visits twice a week. She does not consume alcohol or smoke and attends church regularly.     All other systems were reviewed with the patient and are negative.  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)    Moderate mitral insufficiency    Port-A-Cath in place  Seizures (HCC)    Spinal stenosis    Ulcer    Urinary incontinence     SURGICAL HISTORY: Past Surgical History:  Procedure Laterality Date   ABDOMINAL HYSTERECTOMY     BACK SURGERY     due to polio   BREAST BIOPSY Bilateral    neg   BREAST BIOPSY Right 2011   neg/stereo   CARPAL TUNNEL RELEASE     CATARACT EXTRACTION Right 2020   COLONOSCOPY WITH PROPOFOL  N/A  04/04/2021   Procedure: COLONOSCOPY WITH PROPOFOL ;  Surgeon: Janalyn Keene NOVAK, MD;  Location: [facility] ENDOSCOPY;  Service: Endoscopy;  Laterality: N/A;   ESOPHAGOGASTRODUODENOSCOPY (EGD) WITH PROPOFOL  N/A 06/25/2022   Procedure: ESOPHAGOGASTRODUODENOSCOPY (EGD) WITH PROPOFOL ;  Surgeon: Unk Corinn Skiff, MD;  Location: [facility] ENDOSCOPY;  Service: Gastroenterology;  Laterality: N/A;   EYE SURGERY     IR IMAGING GUIDED PORT INSERTION  10/09/2021    I have reviewed the social history and family history with the patient and they are unchanged from previous note.  ALLERGIES:  is allergic to penicillins.  MEDICATIONS:  Current Outpatient Medications  Medication Sig Dispense Refill   acetaminophen  (TYLENOL ) 500 MG tablet Take 500 mg by mouth every 6 (six) hours as needed.     ALPRAZolam  (XANAX ) 0.5 MG tablet TAKE 1 TABLET BY MOUTH TWICE A DAY AS NEEDED FOR ANXIETY 10.11.23 60 tablet 3   aspirin  EC 81 MG tablet Take 1 tablet (81 mg total) by mouth daily. Swallow whole. 30 tablet 12   atorvastatin  (LIPITOR) 10 MG tablet TAKE 1 TABLET BY MOUTH EVERY DAY 90 tablet 0   busPIRone  (BUSPAR ) 5 MG tablet TAKE 1 TABLET BY MOUTH THREE TIMES A DAY 270 tablet 0   capecitabine  (XELODA ) 500 MG tablet Take 2 tablets (1000 mg total) by mouth in morning and 3 tablets (1500 mg total) by mouth in evening, every 10-12 hours. Take within 30 minutes after meals. Take for 7 days on, then 7 days off. Repeat every 14 days. (Patient taking differently: Take 2 tablets (1000 mg total) by mouth in morning and 2 tablets (1000 mg total) by mouth in evening, every 10-12 hours. Take within 30 minutes after meals. Take for 7 days on, then 7 days off. Repeat every 14 days.) 35 tablet 1   Cyanocobalamin  (VITAMIN B 12 PO) Take 500 mcg by mouth daily.     diclofenac  Sodium (VOLTAREN ) 1 % GEL Apply 4 g topically 4 (four) times daily. 4 g 0   docusate sodium  (COLACE) 100 MG capsule Take 1 capsule (100 mg total) by mouth 2 (two) times daily. 60  capsule 0   FLUoxetine  (PROZAC ) 20 MG capsule TAKE 3 CAPSULES (60 MG TOTAL) BY MOUTH EVERY MORNING. 270 capsule 0   gabapentin  (NEURONTIN ) 300 MG capsule TAKE 1 CAPSULE BY MOUTH THREE TIMES A DAY 90 capsule 3   lidocaine -prilocaine  (EMLA ) cream Apply small amount of cream to port site 1-2 hour prior to chemo treatment. 30 g 6   lidocaine -prilocaine  (EMLA ) cream Apply to affected area once 30 g 3   loperamide  (IMODIUM ) 2 MG capsule TAKE 1 CAP BY MOUTH SEE ADMIN INSTRUCTIONS. INITIAL: 4 MG, FOLLOWED BY 2 MG AFTER EACH LOOSE STOOL MAXIMUM: 16 MG/DAY 60 capsule 0   loratadine  (CLARITIN ) 10 MG tablet Take 1 tablet (10 mg total) by mouth See admin instructions. Take 1 tablet daily for 4 days after each chemotherapy treatments. 90 tablet 0   magic mouthwash w/lidocaine  SOLN Take 5 mLs by mouth 4 (four) times  daily as needed for mouth pain. Sig: Swish/Swallow 5-10 ml four times a day as needed. Dispense 480 ml. 1RF 480 mL 1   meclizine  (ANTIVERT ) 12.5 MG tablet Take 1 tablet (12.5 mg total) by mouth 3 (three) times daily as needed for dizziness. 30 tablet 0   methocarbamol  (ROBAXIN ) 500 MG tablet Take 1 tablet (500 mg total) by mouth 2 (two) times daily as needed for muscle spasms. 180 tablet 0   ondansetron  (ZOFRAN ) 8 MG tablet Take 1 tablet (8 mg total) by mouth 2 (two) times daily as needed (Nausea or vomiting). 30 tablet 1   polyethylene glycol powder (GLYCOLAX /MIRALAX ) 17 GM/SCOOP powder Take 17 g by mouth daily. 3350 g 1   potassium chloride  SA (KLOR-CON  M) 20 MEQ tablet TAKE 1 TABLET EVERY DAY 90 tablet 3   triamterene -hydrochlorothiazide  (MAXZIDE -25) 37.5-25 MG tablet TAKE 1 TABLET EVERY DAY 90 tablet 3   No current facility-administered medications for this visit.   Facility-Administered Medications Ordered in Other Visits  Medication Dose Route Frequency Provider Last Rate Last Admin   heparin  lock flush 100 UNIT/ML injection            palonosetron  (ALOXI ) 0.25 MG/5ML injection             prochlorperazine  (COMPAZINE ) 10 MG tablet             PHYSICAL EXAMINATION: ECOG PERFORMANCE STATUS: 2 - Symptomatic, <50% confined to bed  There were no vitals filed for this visit. Wt Readings from Last 3 Encounters:  06/14/24 141 lb (64 kg)  05/25/24 142 lb 4.8 oz (64.5 kg)  05/22/24 142 lb (64.4 kg)     GENERAL:alert, no distress and comfortable SKIN: skin color, texture, turgor are normal, no rashes or significant lesions EYES: normal, Conjunctiva are pink and non-injected, sclera clear NECK: supple, thyroid normal size, non-tender, without nodularity LYMPH:  no palpable lymphadenopathy in the cervical, axillary  LUNGS: clear to auscultation and percussion with normal breathing effort HEART: regular rate & rhythm and no murmurs and no lower extremity edema ABDOMEN:abdomen soft, non-tender and normal bowel sounds Musculoskeletal:no cyanosis of digits and no clubbing  NEURO: alert & oriented x 3 with fluent speech, no focal motor/sensory deficits  Physical Exam    LABORATORY DATA:  I have reviewed the data as listed    Latest Ref Rng & Units 06/14/2024    1:25 PM 05/22/2024   10:39 AM 05/08/2024   10:43 AM  CBC  WBC 4.0 - 10.5 K/uL 4.2  3.6  4.4   Hemoglobin 12.0 - 15.0 g/dL 88.8  89.8  89.4   Hematocrit 36.0 - 46.0 % 33.2  31.8  33.0   Platelets 150 - 400 K/uL 163  146  164         Latest Ref Rng & Units 06/14/2024    1:25 PM 05/22/2024   10:39 AM 05/08/2024   10:43 AM  CMP  Glucose 70 - 99 mg/dL 98  872  898   BUN 8 - 23 mg/dL 19  15  16    Creatinine 0.44 - 1.00 mg/dL 9.01  9.20  8.83   Sodium 135 - 145 mmol/L 140  141  140   Potassium 3.5 - 5.1 mmol/L 3.6  3.3  3.7   Chloride 98 - 111 mmol/L 102  104  102   CO2 22 - 32 mmol/L 33  31  33   Calcium  8.9 - 10.3 mg/dL 9.4  9.0  9.2   Total Protein 6.5 -  8.1 g/dL 7.5  6.9  7.1   Total Bilirubin 0.0 - 1.2 mg/dL 0.5  0.4  0.5   Alkaline Phos 38 - 126 U/L 182  170  201   AST 15 - 41 U/L 37  23  31   ALT 0 - 44 U/L 24   14  22        RADIOGRAPHIC STUDIES: I have personally reviewed the radiological images as listed and agreed with the findings in the report. No results found.    No orders of the defined types were placed in this encounter.  All questions were answered. The patient knows to call the clinic with any problems, questions or concerns. No barriers to learning was detected. The total time spent in the appointment was 30 minutes, including review of chart and various tests results, discussions about plan of care and coordination of care plan     Onita Mattock, MD 06/14/2024

## 2024-06-14 NOTE — Progress Notes (Signed)
 Per Dr Lanny, no treatment today.

## 2024-06-14 NOTE — Assessment & Plan Note (Signed)
 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  -Treatment course: began first line systemic chemotherapy with 5FU/leuc and bevacizumab  on 10/20/21. Restaging scans showed progression and Oxaliplatin  was added with cycle 7 on 01/21/22. She continued FOLFOX/Beva x12 cycles, oxali was removed with cycle 18 on 09/07/22. She continued 5FU/Beva for 4 additional cycles. She completed cycle 22 on 11/20/22 prior to moving to [place]. At [facility] she received additional cycles of 5FU/Beva through November 2024.  -CT 01/25/2024 showed disease progression in liver, and 2 new lung nodule, up to 1.1 cm, highly concerning for metastasis. -I recommended Xeloda  and Beva in May 2025, she wants to think about it  she finally started on 04/24/2024

## 2024-06-15 ENCOUNTER — Encounter: Payer: Self-pay | Admitting: Hematology

## 2024-06-19 ENCOUNTER — Other Ambulatory Visit: Payer: Self-pay

## 2024-06-19 ENCOUNTER — Other Ambulatory Visit: Payer: Self-pay | Admitting: Nurse Practitioner

## 2024-06-19 ENCOUNTER — Encounter: Payer: Self-pay | Admitting: Hematology

## 2024-06-19 ENCOUNTER — Other Ambulatory Visit: Payer: Self-pay | Admitting: Hematology

## 2024-06-19 DIAGNOSIS — C189 Malignant neoplasm of colon, unspecified: Secondary | ICD-10-CM

## 2024-06-19 NOTE — Progress Notes (Signed)
 Patient is transitioning to hospice. Dis-enrolling.

## 2024-06-19 NOTE — Telephone Encounter (Signed)
 Per Dr. Lanny pt w/f/u in 1 month for lab & OV and will probably discuss hospice.

## 2024-06-26 ENCOUNTER — Other Ambulatory Visit: Payer: Self-pay | Admitting: Hematology

## 2024-06-28 ENCOUNTER — Other Ambulatory Visit: Payer: Self-pay

## 2024-07-18 NOTE — Assessment & Plan Note (Signed)
 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  -Treatment course: began first line systemic chemotherapy with 5FU/leuc and bevacizumab  on 10/20/21. Restaging scans showed progression and Oxaliplatin  was added with cycle 7 on 01/21/22. She continued FOLFOX/Beva x12 cycles, oxali was removed with cycle 18 on 09/07/22. She continued 5FU/Beva for 4 additional cycles. She completed cycle 22 on 11/20/22 prior to moving to [place]. At [facility] she received additional cycles of 5FU/Beva through November 2024.  -CT 01/25/2024 showed disease progression in liver, and 2 new lung nodule, up to 1.1 cm, highly concerning for metastasis. -I recommended Xeloda  and Beva in May 2025, she wants to think about it  she finally started on 04/24/2024. Due to her poor tolerance, treatment stopped on 06/14/2024

## 2024-07-19 ENCOUNTER — Inpatient Hospital Stay (HOSPITAL_BASED_OUTPATIENT_CLINIC_OR_DEPARTMENT_OTHER): Payer: Medicare (Managed Care) | Admitting: Hematology

## 2024-07-19 ENCOUNTER — Inpatient Hospital Stay: Payer: Medicare (Managed Care) | Attending: Nurse Practitioner

## 2024-07-19 VITALS — BP 118/68 | HR 77 | Temp 97.7°F | Resp 18 | Ht <= 58 in | Wt 142.6 lb

## 2024-07-19 DIAGNOSIS — R918 Other nonspecific abnormal finding of lung field: Secondary | ICD-10-CM | POA: Diagnosis not present

## 2024-07-19 DIAGNOSIS — C189 Malignant neoplasm of colon, unspecified: Secondary | ICD-10-CM

## 2024-07-19 DIAGNOSIS — C787 Secondary malignant neoplasm of liver and intrahepatic bile duct: Secondary | ICD-10-CM

## 2024-07-19 DIAGNOSIS — C180 Malignant neoplasm of cecum: Secondary | ICD-10-CM | POA: Diagnosis present

## 2024-07-19 DIAGNOSIS — G893 Neoplasm related pain (acute) (chronic): Secondary | ICD-10-CM | POA: Insufficient documentation

## 2024-07-19 LAB — CBC WITH DIFFERENTIAL (CANCER CENTER ONLY)
Abs Immature Granulocytes: 0.01 K/uL (ref 0.00–0.07)
Basophils Absolute: 0 K/uL (ref 0.0–0.1)
Basophils Relative: 0 %
Eosinophils Absolute: 0.2 K/uL (ref 0.0–0.5)
Eosinophils Relative: 3 %
HCT: 31.8 % — ABNORMAL LOW (ref 36.0–46.0)
Hemoglobin: 10.1 g/dL — ABNORMAL LOW (ref 12.0–15.0)
Immature Granulocytes: 0 %
Lymphocytes Relative: 22 %
Lymphs Abs: 1.1 K/uL (ref 0.7–4.0)
MCH: 31.8 pg (ref 26.0–34.0)
MCHC: 31.8 g/dL (ref 30.0–36.0)
MCV: 100 fL (ref 80.0–100.0)
Monocytes Absolute: 0.6 K/uL (ref 0.1–1.0)
Monocytes Relative: 13 %
Neutro Abs: 2.9 K/uL (ref 1.7–7.7)
Neutrophils Relative %: 62 %
Platelet Count: 185 K/uL (ref 150–400)
RBC: 3.18 MIL/uL — ABNORMAL LOW (ref 3.87–5.11)
RDW: 15.3 % (ref 11.5–15.5)
WBC Count: 4.8 K/uL (ref 4.0–10.5)
nRBC: 0 % (ref 0.0–0.2)

## 2024-07-19 LAB — CEA (ACCESS): CEA (CHCC): 156.13 ng/mL — ABNORMAL HIGH (ref 0.00–5.00)

## 2024-07-19 LAB — CMP (CANCER CENTER ONLY)
ALT: 31 U/L (ref 0–44)
AST: 43 U/L — ABNORMAL HIGH (ref 15–41)
Albumin: 3.4 g/dL — ABNORMAL LOW (ref 3.5–5.0)
Alkaline Phosphatase: 228 U/L — ABNORMAL HIGH (ref 38–126)
Anion gap: 4 — ABNORMAL LOW (ref 5–15)
BUN: 15 mg/dL (ref 8–23)
CO2: 33 mmol/L — ABNORMAL HIGH (ref 22–32)
Calcium: 9.5 mg/dL (ref 8.9–10.3)
Chloride: 102 mmol/L (ref 98–111)
Creatinine: 0.71 mg/dL (ref 0.44–1.00)
GFR, Estimated: >60 mL/min (ref >60)
Glucose, Bld: 108 mg/dL — ABNORMAL HIGH (ref 70–99)
Potassium: 3.6 mmol/L (ref 3.5–5.1)
Sodium: 139 mmol/L (ref 135–145)
Total Bilirubin: 0.5 mg/dL (ref 0.0–1.2)
Total Protein: 7.3 g/dL (ref 6.5–8.1)

## 2024-07-19 NOTE — Progress Notes (Signed)
 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  -Treatment course: began first line systemic chemotherapy with 5FU/leuc and bevacizumab  on 10/20/21. Restaging scans showed progression and Oxaliplatin  was added with cycle 7 on 01/21/22. She continued FOLFOX/Beva x12 cycles, oxali was removed with cycle 18 on 09/07/22. She continued 5FU/Beva for 4 additional cycles. She completed cycle 22 on 11/20/22 prior to moving to [place]. At [facility] she received additional cycles of 5FU/Beva through November 2024.  -CT 01/25/2024 showed disease progression in liver, and 2 new lung nodule, up to 1.1 cm, highly concerning for metastasis. -I recommended Xeloda  and Beva in May 2025, she wants to think about it  she finally started on 04/24/2024. 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 Chemotherapy was discontinued due to poor tolerance and side effects. Current symptoms include pelvic pain, possibly cancer-related, and fatigue. - Continue supportive care - Refer to palliative care for symptom management - Consider radiation therapy if pain worsens and is confirmed to be cancer-related  Cancer-related pain Intermittent pelvic pain, possibly related to metastatic cancer,  managed with Tylenol  and ibuprofen . No need for prescription pain medication at this time. - Continue current pain management with Tylenol  and ibuprofen  - Monitor pain levels and consider prescription pain medication if pain worsens  Fatigue and weakness secondary to cancer and prior chemotherapy Fatigue and weakness likely due to both cancer and prior chemotherapy. Symptoms have improved since stopping chemotherapy but are not fully resolved. - Continue supportive care  Chemotherapy-induced peripheral neuropathy Peripheral neuropathy managed with gabapentin . She has an upcoming appointment with her primary care provider to discuss further management. - Continue gabapentin  for neuropathy - Follow up with primary care provider for further management of neuropathy   Goals of Care Discussed options for in-home care, including hospice and palliative care. Hospice focuses on comfort and symptom management without aggressive treatment, while palliative care offers less frequent visits and is not available for emergencies. Hospice provides comprehensive home care, including equipment and 24/7 support, but does not cover personal care or routine medical procedures. Palliative care allows for continued visits to other doctors and is a step towards hospice if needed. - Initiate palliative care services - Discuss transition to hospice care if condition worsens or if she decides to stop seeing other doctors  Plan - Symptom management reviewed, she has recovered some from chemotherapy. - Continue supportive care.  I discussed option of hospice versus palliative care, after lengthy discussion, she would like to have palliative care at home for now, so she can see other doctors and do PT -lab and f/u in 2 months  - If she has more cancer related symptoms, I may refer her to palliative care clinic in our cancer center.   SUMMARY OF ONCOLOGIC HISTORY: Oncology History  Metastatic colon cancer to liver  (HCC)  08/21/2021 Imaging   CT abdomen pelvis with contrast  showed soft tissue mass at the base of cecum measuring up to 5 cm, slightly increased in size.  Interval development of multiple low-density lesions throughout the liver highly suggestive for metastatic disease.  Complex mass in the region of the vaginal cuff measuring up to 5.2 cm, increased in size.  Right ovary also appears more prominent on the current examination.  Moderate large volume of stool throughout the colon.  Mild urinary bladder wall thickening.  Colonic diverticulosis without evidence of active diverticulitis.  Aortic atherosclerosis   09/04/2021 Procedure   patient is status post left lobe liver lesion biopsy and pathology showed metastatic adenocarcinoma, compatible with colorectal primary.   09/04/2021 Cancer Staging   Staging form: Colon and Rectum, AJCC 8th Edition - Clinical stage from 09/04/2021: Stage Unknown (cTX, cNX, cM1) - Signed by Chelsea Call, MD on 05/13/2022 Stage prefix: Initial diagnosis   09/16/2021 Initial Diagnosis   Metastatic colon cancer to liver [facility]) Foundation one liquid biopsy testing showed KRASG12V, APC TP53 TMB 4  Patient initially went to emergency room on 03/24/2021 for abdominal pain. 03/24/2021, CT scan of the abdomen showed marked bladder wall thickening with surrounding soft tissue stranding is noted.  Concerning for cystitis.  There is a lobulated mass between the posterior wall of the bladder and the rectum which appears to arise from the vaginal cuff.  This is indeterminate and difficult to characterize reflecting lack of IV contrast material.  Nonobstructing left renal calculus, left sacroiliitis, lumbar spondylosis aortic atherosclerosis. 03/24/2021 CT pelvic showed abdominal [place] soft tissue of right cecum, concerning for colon carcinoma.  Colonoscopy recommended.  Low-attenuation mass in the region of vaginal cuff is again noted.  Diffuse urinary bladder wall thickening and mild  bilateral hydroureter possibly cystitis.   US  03/24/2021 Lobulated solid 3.8 cm mass confirmed by ultrasound. This is most compatible with a neoplasm but remains indeterminate as to the origin as the epicenter seems to be outside of both the vaginal cuff and the adjacent colon, while the lesion appears inseparable from both. Extensive echogenic debris within the distended urinary bladder.   03/26/2021, patient was seen by Dr. Elby for evaluation of colonic/vaginal cuff pelvic mass.  Patient also was referred to gastroenterology for colonoscopy.   03/26/2021 CEA 3.4.  CA125 9.7 04/04/2021, status post colonoscopy which showed a frond like /villous nonobstructing large mass was found in the cecum, this was biopsied.  Terminal ileum was briefly intubated and appeared normal.  Diverticulosis in the sigmoid colon.  The rectum, sigmoid colon, descending colon, transverse colon and ascending colon were normal.  Nonbleeding internal hemorrhoids. 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 Given that the biopsy may not be representative of the entire underlying lesion.  Patient was recommended to establish with surgeon for evaluation. Patient no showed for her surgery appointment and as well as her follow-up appointment with gastroenterology.  Per daughter, patient was having cervical spine surgery and she prioritized that over the colon surgery.   10/20/2021 - 01/07/2022 Chemotherapy    COLORECTAL 5-FU  + Bevacizumab  q14d      10/20/2021 - 11/23/2022 Chemotherapy   Patient is on Treatment Plan : COLORECTAL 5FU + Leucovorin  (Modified DeGramont) + Bevacizumab  q14d     12/22/2021 Imaging   CT chest abdomen pelvis Decreased cecum mass, however increased size and size of metastatic lesions throughout the liver.  Interval enlargement of vaginal cuff mass. No mets in chest.    01/21/2022 -  Chemotherapy   COLORECTAL FOLFOX  + Bevacizumab  q14d    patient was seen by gynecology oncology Dr.  Mancil.  Biopsy of the vaginal mass was not recommended.Patient's case was also discussed on multidisciplinary tumor board.  IR potentially can try a biopsy if patient is willing to.I had a discussion with patient's daughter over the phone prior to this visit.  We discussed about option of adding oxaliplatin  to 5-FU/bevacizumab  and continue treatment of colon cancer, repeat CT scan short-term and if progression, repeat biopsy versus proceeding with vaginal mass biopsy.  Daughter prefers proceeding with chemotherapy and defer biopsy.   01/08/2022 patient has had a second opinion at [facility] and was seen by Dr. Brenna .  Dr. Brenna agrees with the current treatment plan with FOLFOX/bevacizumab .  He has ordered guardant 360 to see if she may be eligible for clinical trial in the future.  -   04/13/2022 Imaging    CT chest abdomen pelvis showed a cecal mass with stable hepatic metastasis.  Mixed cystic and solid mass in the region of the vaginal cuff, new area of patchy groundglass and consolidation in the right upper lobe, lingula and left lower lobe.  Likely due to pneumonia.  Hepatic steatosis.  Bilateral renal stones.  Aortic atherosclerosis   06/24/2022 Imaging   CT abdomen pelvis with contrast showed There are low-density lesions in liver consistent with metastatic disease with no significant change. Wall thickening in the cecum appears less prominent. There is 5.5 x 4.2 cm mixed density lesion in vaginal cuff suggesting possible metastatic disease.   There is no evidence of intestinal obstruction or pneumoperitoneum.There is no hydronephrosis.   Small patchy infiltrates are seen in the lower lung fields suggesting atelectasis/pneumonia.   Small bilateral renal stones. Diverticulosis of colon without signs of diverticulitis. 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 There is encroachment of neural foramina at L4-L5 and L5-S1 levels.     06/25/2022 Imaging   CT soft tissue  neck with contrast showed1No acute process in the neck to explain the patient's difficulty swallowing.  CT chest contrast showed 1. Patchy areas of airspace opacity in the right upper lobe and left lower lobe and lingula slightly progressed since the prior radiograph and may represent worsening or recurrent pneumonia. Clinical correlation and follow-up to resolution recommended. 2. Aortic Atherosclerosis    10/14/2022 Imaging   CT chest abdomen pelvis with contrast showed Stable small hypovascular liver metastases.  Stable mass in pelvic cul-de-sac involving the vaginal cuff,consistent with metastatic disease.  No new or progressive metastatic disease within the chest, abdomen,or pelvis. Stable bilateral bilateral airspace disease, suspicious for pneumonia or inflammatory etiology.   05/08/2024 - 05/22/2024 Chemotherapy   Patient is on Treatment Plan : COLORECTAL Bevacizumab  q14d        Discussed the use of AI scribe software for clinical note transcription with the patient, who gave verbal consent to proceed.  History of Present Illness Chelsea [name] is a 78 year old female with metastatic colon cancer who presents for follow-up after stopping chemotherapy. She is accompanied by a caregiver from the [place].  Since discontinuing chemotherapy, she eats well and feels less weak, though some weakness persists. She experiences cramp-like lower abdominal pain about three times a week, lasting a few minutes. Bowel movements are regular without constipation. Occasional burning at the end of urination occurs, with no hematuria, and urine flow has slowed. Current medications include Tylenol , ibuprofen , Xanax , Robaxin , and gabapentin . She experiences neuropathy and plans to  discuss shingles vaccination. She has changed her diet, avoiding alcohol and smoking, and lives independently with assistance from a caregiver.     All other systems were reviewed with the patient and are negative.  MEDICAL  HISTORY:  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)    Moderate mitral insufficiency    Port-A-Cath in place    Seizures [facility])    Spinal stenosis    Ulcer    Urinary incontinence     SURGICAL HISTORY: Past Surgical History:  Procedure Laterality Date   ABDOMINAL HYSTERECTOMY     BACK SURGERY     due to polio   BREAST BIOPSY Bilateral    neg   BREAST BIOPSY Right 2011   neg/stereo   CARPAL TUNNEL RELEASE     CATARACT EXTRACTION Right 2020   COLONOSCOPY WITH PROPOFOL  N/A 04/04/2021   Procedure: COLONOSCOPY WITH PROPOFOL ;  Surgeon: Janalyn Keene NOVAK, MD;  Location: [facility] ENDOSCOPY;  Service: Endoscopy;  Laterality: N/A;   ESOPHAGOGASTRODUODENOSCOPY (EGD) WITH PROPOFOL  N/A 06/25/2022   Procedure: ESOPHAGOGASTRODUODENOSCOPY (EGD) WITH PROPOFOL ;  Surgeon: Unk Corinn Skiff, MD;  Location: [facility] ENDOSCOPY;  Service: Gastroenterology;  Laterality: N/A;   EYE SURGERY     IR IMAGING GUIDED PORT INSERTION  10/09/2021    I have reviewed the social history and family history with the patient and they are unchanged from previous note.  ALLERGIES:  is allergic to penicillins.  MEDICATIONS:  Current Outpatient Medications  Medication Sig Dispense Refill   acetaminophen  (TYLENOL ) 500 MG tablet Take 500 mg by mouth every 6 (six) hours as needed.     ALPRAZolam  (XANAX ) 0.5 MG tablet TAKE 1 TABLET BY MOUTH TWICE A DAY AS NEEDED FOR ANXIETY 10.11.23 60 tablet 3   aspirin  EC 81 MG tablet Take 1 tablet (81 mg total) by mouth daily. Swallow whole. 30 tablet 12   atorvastatin  (LIPITOR) 10 MG tablet TAKE 1 TABLET BY MOUTH EVERY DAY 90 tablet 0   busPIRone  (BUSPAR ) 5 MG tablet TAKE 1 TABLET BY MOUTH THREE TIMES A DAY 270 tablet 0   Cyanocobalamin  (VITAMIN B 12 PO) Take 500 mcg by mouth daily.     diclofenac  Sodium (VOLTAREN ) 1 % GEL Apply 4 g topically 4 (four) times daily.  4 g 0   docusate sodium  (COLACE) 100 MG capsule Take 1 capsule (100 mg total) by mouth 2 (two) times daily. 60 capsule 0   FLUoxetine  (PROZAC ) 20 MG capsule TAKE 3 CAPSULES (60 MG TOTAL) BY MOUTH EVERY MORNING. 270 capsule 0   gabapentin  (NEURONTIN ) 300 MG capsule TAKE 1 CAPSULE BY MOUTH THREE TIMES A DAY 90 capsule 3   lidocaine -prilocaine  (EMLA ) cream Apply small amount of cream to port site 1-2 hour prior to chemo treatment. 30 g 6   loperamide  (IMODIUM ) 2 MG capsule TAKE 1 CAP BY MOUTH SEE ADMIN INSTRUCTIONS. INITIAL: 4 MG, FOLLOWED BY 2 MG AFTER EACH LOOSE STOOL MAXIMUM: 16 MG/DAY 60 capsule 0   loratadine  (CLARITIN ) 10 MG tablet Take 1 tablet (10 mg total) by mouth See admin instructions. Take 1 tablet daily for 4 days after each chemotherapy treatments. 90 tablet 0   magic mouthwash w/lidocaine  SOLN Take 5 mLs by mouth 4 (four) times daily as needed for mouth pain. Sig: Swish/Swallow 5-10 ml four times a day as needed. Dispense  480 ml. 1RF 480 mL 1   meclizine  (ANTIVERT ) 12.5 MG tablet Take 1 tablet (12.5 mg total) by mouth 3 (three) times daily as needed for dizziness. 30 tablet 0   methocarbamol  (ROBAXIN ) 500 MG tablet Take 1 tablet (500 mg total) by mouth 2 (two) times daily as needed for muscle spasms. 180 tablet 0   ondansetron  (ZOFRAN ) 8 MG tablet Take 1 tablet (8 mg total) by mouth 2 (two) times daily as needed (Nausea or vomiting). 30 tablet 1   polyethylene glycol powder (GLYCOLAX /MIRALAX ) 17 GM/SCOOP powder Take 17 g by mouth daily. 3350 g 1   potassium chloride  SA (KLOR-CON  M) 20 MEQ tablet TAKE 1 TABLET EVERY DAY 90 tablet 3   triamterene -hydrochlorothiazide  (MAXZIDE -25) 37.5-25 MG tablet TAKE 1 TABLET EVERY DAY 90 tablet 3   No current facility-administered medications for this visit.   Facility-Administered Medications Ordered in Other Visits  Medication Dose Route Frequency Provider Last Rate Last Admin   heparin  lock flush 100 UNIT/ML injection            palonosetron   (ALOXI ) 0.25 MG/5ML injection            prochlorperazine  (COMPAZINE ) 10 MG tablet             PHYSICAL EXAMINATION: ECOG PERFORMANCE STATUS: 2 - Symptomatic, <50% confined to bed  Vitals:   07/19/24 1100  BP: 118/68  Pulse: 77  Resp: 18  Temp: 97.7 F (36.5 C)  SpO2: 98%   Wt Readings from Last 3 Encounters:  07/19/24 142 lb 9.6 oz (64.7 kg)  06/14/24 141 lb (64 kg)  05/25/24 142 lb 4.8 oz (64.5 kg)     GENERAL:alert, no distress and comfortable SKIN: skin color, texture, turgor are normal, no rashes or significant lesions EYES: normal, Conjunctiva are pink and non-injected, sclera clear NECK: supple, thyroid normal size, non-tender, without nodularity LYMPH:  no palpable lymphadenopathy in the cervical, axillary  LUNGS: clear to auscultation and percussion with normal breathing effort HEART: regular rate & rhythm and no murmurs and no lower extremity edema ABDOMEN:abdomen soft, non-tender and normal bowel sounds Musculoskeletal:no cyanosis of digits and no clubbing  NEURO: alert & oriented x 3 with fluent speech, no focal motor/sensory deficits  Physical Exam   LABORATORY DATA:  I have reviewed the data as listed    Latest Ref Rng & Units 07/19/2024   11:27 AM 06/14/2024    1:25 PM 05/22/2024   10:39 AM  CBC  WBC 4.0 - 10.5 K/uL 4.8  4.2  3.6   Hemoglobin 12.0 - 15.0 g/dL 89.8  88.8  89.8   Hematocrit 36.0 - 46.0 % 31.8  33.2  31.8   Platelets 150 - 400 K/uL 185  163  146         Latest Ref Rng & Units 07/19/2024   11:27 AM 06/14/2024    1:25 PM 05/22/2024   10:39 AM  CMP  Glucose 70 - 99 mg/dL 891  98  872   BUN 8 - 23 mg/dL 15  19  15    Creatinine 0.44 - 1.00 mg/dL 9.28  9.01  9.20   Sodium 135 - 145 mmol/L 139  140  141   Potassium 3.5 - 5.1 mmol/L 3.6  3.6  3.3   Chloride 98 - 111 mmol/L 102  102  104   CO2 22 - 32 mmol/L 33  33  31   Calcium  8.9 - 10.3 mg/dL 9.5  9.4  9.0   Total Protein  6.5 - 8.1 g/dL 7.3  7.5  6.9   Total Bilirubin 0.0 - 1.2  mg/dL 0.5  0.5  0.4   Alkaline Phos 38 - 126 U/L 228  182  170   AST 15 - 41 U/L 43  37  23   ALT 0 - 44 U/L 31  24  14        RADIOGRAPHIC STUDIES: I have personally reviewed the radiological images as listed and agreed with the findings in the report. No results found.    No orders of the defined types were placed in this encounter.  All questions were answered. The patient knows to [name] the clinic with any problems, questions or concerns. No barriers to learning was detected. The total time spent in the appointment was 30 minutes, including review of chart and various tests results, discussions about plan of care and coordination of care plan     Onita Mattock, MD 07/19/2024

## 2024-07-19 NOTE — Addendum Note (Signed)
 Addended by: Verdell Dykman P on: 07/19/2024 02:44 PM   Modules accepted: Orders

## 2024-07-19 NOTE — Progress Notes (Signed)
 Verbal order w/readback from Dr. Lanny for referral to [facility] for Palliative Care Services.  Order placed and Randine Nail, RN & Hunter Seip, RN (Internal Acct Reps for [employer]) notified.

## 2024-07-21 ENCOUNTER — Telehealth: Payer: Self-pay | Admitting: *Deleted

## 2024-07-21 NOTE — Telephone Encounter (Signed)
 Spoke with the patient and informed her a visit is needed for evaluation and advised she seek care at an urgent care as we do not have any openings today.  Patient questioned why I was calling, stated she already has an appt next week with PCP and was advised to contact an urgent care or ER for evaluation if needed prior to the visit.

## 2024-07-21 NOTE — Telephone Encounter (Signed)
 Copied from CRM #[id number]. Topic: Clinical - Refused Triage >> Jul 21, 2024 11:20 AM Alfonso HERO wrote: Red Word that prompted transfer to Nurse Triage: been in pain since she had the shingles vaccine and also lower stomach pain >> Jul 21, 2024 11:37 AM Nurse Olam BROCKS wrote: Refused to speak to [facility] NT.

## 2024-07-25 ENCOUNTER — Ambulatory Visit: Payer: Medicare (Managed Care) | Admitting: Internal Medicine

## 2024-07-26 ENCOUNTER — Encounter: Payer: Self-pay | Admitting: Internal Medicine

## 2024-07-26 ENCOUNTER — Other Ambulatory Visit: Payer: Self-pay | Admitting: Internal Medicine

## 2024-07-26 ENCOUNTER — Ambulatory Visit: Payer: Medicare (Managed Care) | Admitting: Internal Medicine

## 2024-07-26 VITALS — BP 120/84 | HR 100 | Temp 98.4°F | Wt 137.9 lb

## 2024-07-26 DIAGNOSIS — R300 Dysuria: Secondary | ICD-10-CM | POA: Diagnosis not present

## 2024-07-26 DIAGNOSIS — R1024 Suprapubic pain: Secondary | ICD-10-CM | POA: Diagnosis not present

## 2024-07-26 NOTE — Progress Notes (Signed)
 Established Patient Office Visit     CC/Reason for Visit: Suprapubic pain, dysuria, increased urinary frequency  HPI: Chelsea [name] is a 78 y.o. female who is coming in today for the above mentioned reasons.  Has been going on for about a week.  No fever.   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)    Moderate mitral insufficiency    Port-A-Cath in place    Seizures [facility])    Spinal stenosis    Ulcer    Urinary incontinence     Past Surgical History:  Procedure Laterality Date   ABDOMINAL HYSTERECTOMY     BACK SURGERY     due to polio   BREAST BIOPSY Bilateral    neg   BREAST BIOPSY Right 2011   neg/stereo   CARPAL TUNNEL RELEASE     CATARACT EXTRACTION Right 2020   COLONOSCOPY WITH PROPOFOL  N/A 04/04/2021   Procedure: COLONOSCOPY WITH PROPOFOL ;  Surgeon: Janalyn Keene NOVAK, MD;  Location: [facility] ENDOSCOPY;  Service: Endoscopy;  Laterality: N/A;   ESOPHAGOGASTRODUODENOSCOPY (EGD) WITH PROPOFOL  N/A 06/25/2022   Procedure: ESOPHAGOGASTRODUODENOSCOPY (EGD) WITH PROPOFOL ;  Surgeon: Unk Corinn Skiff, MD;  Location: [facility] ENDOSCOPY;  Service: Gastroenterology;  Laterality: N/A;   EYE SURGERY     IR IMAGING GUIDED PORT INSERTION  10/09/2021    Social History:  reports that she has never smoked. She has never used smokeless tobacco. She reports that she does not drink alcohol and does not use drugs.  Allergies: Allergies  Allergen Reactions   Penicillins     Everything turned black    Family History:  Family History  Problem Relation Age of Onset   Arthritis Mother    Heart disease Mother    Stroke Mother    Hypertension Mother    COPD Mother    Sudden death Sister    Other Father        unknown medical history   Breast cancer Neg Hx      Current Outpatient Medications:    acetaminophen   (TYLENOL ) 500 MG tablet, Take 500 mg by mouth every 6 (six) hours as needed., Disp: , Rfl:    ALPRAZolam  (XANAX ) 0.5 MG tablet, TAKE 1 TABLET BY MOUTH TWICE A DAY AS NEEDED FOR ANXIETY 10.11.23, Disp: 60 tablet, Rfl: 3   aspirin  EC 81 MG tablet, Take 1 tablet (81 mg total) by mouth daily. Swallow whole., Disp: 30 tablet, Rfl: 12   atorvastatin  (LIPITOR) 10 MG tablet, TAKE 1 TABLET BY MOUTH EVERY DAY, Disp: 90 tablet, Rfl: 0   busPIRone  (BUSPAR ) 5 MG tablet, TAKE 1 TABLET BY MOUTH THREE TIMES A DAY, Disp: 270 tablet, Rfl: 0   Cyanocobalamin  (VITAMIN B 12 PO), Take 500 mcg by mouth daily., Disp: , Rfl:    diclofenac  Sodium (VOLTAREN ) 1 % GEL, Apply 4 g topically 4 (four) times daily., Disp: 4 g, Rfl: 0   docusate sodium  (COLACE) 100 MG capsule, Take 1 capsule (100 mg total) by mouth 2 (two) times daily., Disp: 60 capsule, Rfl: 0   FLUoxetine  (PROZAC ) 20 MG capsule, TAKE 3 CAPSULES (60 MG TOTAL) BY MOUTH EVERY MORNING., Disp: 270 capsule, Rfl: 0   gabapentin  (NEURONTIN ) 300 MG capsule, TAKE 1 CAPSULE BY MOUTH THREE TIMES A DAY, Disp: 90 capsule, Rfl: 3  lidocaine -prilocaine  (EMLA ) cream, Apply small amount of cream to port site 1-2 hour prior to chemo treatment., Disp: 30 g, Rfl: 6   loperamide  (IMODIUM ) 2 MG capsule, TAKE 1 CAP BY MOUTH SEE ADMIN INSTRUCTIONS. INITIAL: 4 MG, FOLLOWED BY 2 MG AFTER EACH LOOSE STOOL MAXIMUM: 16 MG/DAY, Disp: 60 capsule, Rfl: 0   loratadine  (CLARITIN ) 10 MG tablet, Take 1 tablet (10 mg total) by mouth See admin instructions. Take 1 tablet daily for 4 days after each chemotherapy treatments., Disp: 90 tablet, Rfl: 0   magic mouthwash w/lidocaine  SOLN, Take 5 mLs by mouth 4 (four) times daily as needed for mouth pain. Sig: Swish/Swallow 5-10 ml four times a day as needed. Dispense 480 ml. 1RF, Disp: 480 mL, Rfl: 1   meclizine  (ANTIVERT ) 12.5 MG tablet, Take 1 tablet (12.5 mg total) by mouth 3 (three) times daily as needed for dizziness., Disp: 30 tablet, Rfl: 0    methocarbamol  (ROBAXIN ) 500 MG tablet, Take 1 tablet (500 mg total) by mouth 2 (two) times daily as needed for muscle spasms., Disp: 180 tablet, Rfl: 0   ondansetron  (ZOFRAN ) 8 MG tablet, Take 1 tablet (8 mg total) by mouth 2 (two) times daily as needed (Nausea or vomiting)., Disp: 30 tablet, Rfl: 1   polyethylene glycol powder (GLYCOLAX /MIRALAX ) 17 GM/SCOOP powder, Take 17 g by mouth daily., Disp: 3350 g, Rfl: 1   potassium chloride  SA (KLOR-CON  M) 20 MEQ tablet, TAKE 1 TABLET EVERY DAY, Disp: 90 tablet, Rfl: 3   triamterene -hydrochlorothiazide  (MAXZIDE -25) 37.5-25 MG tablet, TAKE 1 TABLET EVERY DAY, Disp: 90 tablet, Rfl: 3 No current facility-administered medications for this visit.  Facility-Administered Medications Ordered in Other Visits:    heparin  lock flush 100 UNIT/ML injection, , , ,    palonosetron  (ALOXI ) 0.25 MG/5ML injection, , , ,    prochlorperazine  (COMPAZINE ) 10 MG tablet, , , ,   Review of Systems:  Negative unless indicated in HPI.   Physical Exam: Vitals:   07/26/24 1537  BP: 120/84  Pulse: 100  Temp: 98.4 F (36.9 C)  TempSrc: Oral  SpO2: 98%  Weight: 137 lb 14.4 oz (62.6 kg)    Body mass index is 28.82 kg/m.   Impression and Plan:  Dysuria -     POCT Urinalysis Dipstick (Automated)  Suprapubic pain   - Unable to provide urine sample in office.  Sterile cup given for to provide sample for us  tomorrow to analyze.  If needed antibiotics will be called in.  Time spent:22 minutes reviewing chart, interviewing and examining patient and formulating plan of care.     [name], MD [facility]

## 2024-07-27 LAB — URINALYSIS, ROUTINE W REFLEX MICROSCOPIC
Bilirubin Urine: NEGATIVE
Hgb urine dipstick: NEGATIVE
Ketones, ur: NEGATIVE
Leukocytes,Ua: NEGATIVE
Nitrite: NEGATIVE
Specific Gravity, Urine: <=1.005 — AB (ref 1.000–1.030)
Total Protein, Urine: NEGATIVE
Urine Glucose: NEGATIVE
Urobilinogen, UA: 1 (ref 0.0–1.0)
pH: 6.5 (ref 5.0–8.0)

## 2024-07-27 LAB — URINALYSIS
Bilirubin Urine: NEGATIVE
Hgb urine dipstick: NEGATIVE
Ketones, ur: NEGATIVE
Leukocytes,Ua: NEGATIVE
Nitrite: NEGATIVE
Specific Gravity, Urine: <=1.005 — AB (ref 1.000–1.030)
Total Protein, Urine: NEGATIVE
Urine Glucose: NEGATIVE
Urobilinogen, UA: 1 (ref 0.0–1.0)
pH: 6.5 (ref 5.0–8.0)

## 2024-07-28 LAB — URINE CULTURE
MICRO NUMBER:: 17139402
Result:: NO GROWTH
SPECIMEN QUALITY:: ADEQUATE

## 2024-07-31 ENCOUNTER — Telehealth: Payer: Self-pay | Admitting: *Deleted

## 2024-07-31 NOTE — Telephone Encounter (Signed)
 Copied from CRM [id number]. Topic: Clinical - Lab/Test Results >> Jul 28, 2024 12:23 PM Tysheama G wrote: Reason for CRM: Patient is calling regarding her urine results and if she needs any medication. Callback number [phone]

## 2024-07-31 NOTE — Telephone Encounter (Signed)
 Patient is aware of her lab result.  See phone note.

## 2024-07-31 NOTE — Telephone Encounter (Signed)
 Copied from CRM [id number]. Topic: Clinical - Lab/Test Results >> Jul 28, 2024  3:54 PM Victoria A wrote: Reason for CRM: Patient would like to know what her results are for UA and if she needs antibiotics -please contact

## 2024-07-31 NOTE — Telephone Encounter (Signed)
 Patient is aware.

## 2024-08-03 ENCOUNTER — Ambulatory Visit: Payer: Medicare (Managed Care) | Admitting: Internal Medicine

## 2024-08-15 ENCOUNTER — Telehealth: Payer: Self-pay

## 2024-08-15 NOTE — Telephone Encounter (Signed)
 Pt daughter, Madelyn anis for follow up regarding palliative care referral. Upon chart review referral was placed for [facility], pt not known to [facility] palliative. Message routed to oncology team for further follow up.

## 2024-08-16 ENCOUNTER — Encounter: Payer: Self-pay | Admitting: Hematology

## 2024-08-18 ENCOUNTER — Encounter: Payer: Self-pay | Admitting: Hematology

## 2024-08-22 ENCOUNTER — Telehealth: Payer: Self-pay

## 2024-08-22 NOTE — Telephone Encounter (Signed)
 Copied from CRM [id number]. Topic: General - Other >> Aug 22, 2024  2:33 PM Nessti S wrote: Reason for CRM: called to let the doctor know that pt daughter has pallative care for pt.

## 2024-08-24 ENCOUNTER — Telehealth: Payer: Self-pay | Admitting: Hematology

## 2024-08-24 NOTE — Telephone Encounter (Signed)
 Lvm regarding  pt  appt.

## 2024-09-03 ENCOUNTER — Emergency Department (HOSPITAL_COMMUNITY): Payer: Medicare (Managed Care)

## 2024-09-03 ENCOUNTER — Encounter (HOSPITAL_COMMUNITY): Payer: Self-pay

## 2024-09-03 ENCOUNTER — Other Ambulatory Visit: Payer: Self-pay

## 2024-09-03 ENCOUNTER — Observation Stay (HOSPITAL_COMMUNITY)
Admission: EM | Admit: 2024-09-03 | Discharge: 2024-09-07 | DRG: 947 | Disposition: A | Payer: Medicare (Managed Care) | Attending: Internal Medicine | Admitting: Internal Medicine

## 2024-09-03 ENCOUNTER — Emergency Department (HOSPITAL_COMMUNITY): Admit: 2024-09-03 | Discharge: 2024-09-03 | Disposition: A | Payer: Medicare (Managed Care)

## 2024-09-03 DIAGNOSIS — I700 Atherosclerosis of aorta: Secondary | ICD-10-CM | POA: Diagnosis present

## 2024-09-03 DIAGNOSIS — I5033 Acute on chronic diastolic (congestive) heart failure: Secondary | ICD-10-CM | POA: Insufficient documentation

## 2024-09-03 DIAGNOSIS — R609 Edema, unspecified: Secondary | ICD-10-CM | POA: Diagnosis not present

## 2024-09-03 DIAGNOSIS — G4733 Obstructive sleep apnea (adult) (pediatric): Secondary | ICD-10-CM | POA: Diagnosis present

## 2024-09-03 DIAGNOSIS — F419 Anxiety disorder, unspecified: Secondary | ICD-10-CM | POA: Diagnosis present

## 2024-09-03 DIAGNOSIS — I10 Essential (primary) hypertension: Secondary | ICD-10-CM | POA: Diagnosis present

## 2024-09-03 DIAGNOSIS — R531 Weakness: Secondary | ICD-10-CM | POA: Diagnosis not present

## 2024-09-03 DIAGNOSIS — W19XXXA Unspecified fall, initial encounter: Secondary | ICD-10-CM | POA: Diagnosis not present

## 2024-09-03 DIAGNOSIS — Z515 Encounter for palliative care: Secondary | ICD-10-CM

## 2024-09-03 DIAGNOSIS — G893 Neoplasm related pain (acute) (chronic): Principal | ICD-10-CM

## 2024-09-03 DIAGNOSIS — R2241 Localized swelling, mass and lump, right lower limb: Secondary | ICD-10-CM

## 2024-09-03 DIAGNOSIS — F32A Depression, unspecified: Secondary | ICD-10-CM | POA: Diagnosis present

## 2024-09-03 DIAGNOSIS — E876 Hypokalemia: Secondary | ICD-10-CM | POA: Diagnosis present

## 2024-09-03 DIAGNOSIS — C189 Malignant neoplasm of colon, unspecified: Secondary | ICD-10-CM | POA: Diagnosis present

## 2024-09-03 DIAGNOSIS — R4589 Other symptoms and signs involving emotional state: Secondary | ICD-10-CM

## 2024-09-03 DIAGNOSIS — R600 Localized edema: Secondary | ICD-10-CM | POA: Diagnosis present

## 2024-09-03 DIAGNOSIS — R296 Repeated falls: Secondary | ICD-10-CM

## 2024-09-03 DIAGNOSIS — Z7189 Other specified counseling: Secondary | ICD-10-CM

## 2024-09-03 DIAGNOSIS — E7800 Pure hypercholesterolemia, unspecified: Secondary | ICD-10-CM | POA: Diagnosis present

## 2024-09-03 LAB — COMPREHENSIVE METABOLIC PANEL WITH GFR
ALT: 68 U/L — ABNORMAL HIGH (ref 0–44)
AST: 174 U/L — ABNORMAL HIGH (ref 15–41)
Albumin: 3 g/dL — ABNORMAL LOW (ref 3.5–5.0)
Alkaline Phosphatase: 685 U/L — ABNORMAL HIGH (ref 38–126)
Anion gap: 8 (ref 5–15)
BUN: 12 mg/dL (ref 8–23)
CO2: 30 mmol/L (ref 22–32)
Calcium: 9 mg/dL (ref 8.9–10.3)
Chloride: 101 mmol/L (ref 98–111)
Creatinine, Ser: 0.59 mg/dL (ref 0.44–1.00)
GFR, Estimated: >60 mL/min (ref >60)
Glucose, Bld: 98 mg/dL (ref 70–99)
Potassium: 3.4 mmol/L — ABNORMAL LOW (ref 3.5–5.1)
Sodium: 138 mmol/L (ref 135–145)
Total Bilirubin: 1.2 mg/dL (ref 0.0–1.2)
Total Protein: 7 g/dL (ref 6.5–8.1)

## 2024-09-03 LAB — CBC WITH DIFFERENTIAL/PLATELET
Abs Immature Granulocytes: 0.04 K/uL (ref 0.00–0.07)
Basophils Absolute: 0 K/uL (ref 0.0–0.1)
Basophils Relative: 0 %
Eosinophils Absolute: 0 K/uL (ref 0.0–0.5)
Eosinophils Relative: 0 %
HCT: 29.6 % — ABNORMAL LOW (ref 36.0–46.0)
Hemoglobin: 9.3 g/dL — ABNORMAL LOW (ref 12.0–15.0)
Immature Granulocytes: 0 %
Lymphocytes Relative: 14 %
Lymphs Abs: 1.3 K/uL (ref 0.7–4.0)
MCH: 30.3 pg (ref 26.0–34.0)
MCHC: 31.4 g/dL (ref 30.0–36.0)
MCV: 96.4 fL (ref 80.0–100.0)
Monocytes Absolute: 0.9 K/uL (ref 0.1–1.0)
Monocytes Relative: 10 %
Neutro Abs: 6.9 K/uL (ref 1.7–7.7)
Neutrophils Relative %: 76 %
Platelets: 203 K/uL (ref 150–400)
RBC: 3.07 MIL/uL — ABNORMAL LOW (ref 3.87–5.11)
RDW: 16.5 % — ABNORMAL HIGH (ref 11.5–15.5)
WBC: 9.2 K/uL (ref 4.0–10.5)
nRBC: 0 % (ref 0.0–0.2)

## 2024-09-03 LAB — POCT I-STAT, CHEM 8
BUN: 11 mg/dL (ref 8–23)
Calcium, Ion: 1.16 mmol/L (ref 1.15–1.40)
Chloride: 98 mmol/L (ref 98–111)
Creatinine, Ser: 0.7 mg/dL (ref 0.44–1.00)
Glucose, Bld: 95 mg/dL (ref 70–99)
HCT: 30 % — ABNORMAL LOW (ref 36.0–46.0)
Hemoglobin: 10.2 g/dL — ABNORMAL LOW (ref 12.0–15.0)
Potassium: 3.3 mmol/L — ABNORMAL LOW (ref 3.5–5.1)
Sodium: 140 mmol/L (ref 135–145)
TCO2: 28 mmol/L (ref 22–32)

## 2024-09-03 LAB — LIPASE, BLOOD: Lipase: 36 U/L (ref 11–51)

## 2024-09-03 LAB — CG4 I-STAT (LACTIC ACID)
Lactic Acid, Venous: 1.2 mmol/L (ref 0.5–1.9)
Lactic Acid, Venous: 1.2 mmol/L (ref 0.5–1.9)

## 2024-09-03 LAB — PRO BRAIN NATRIURETIC PEPTIDE: Pro Brain Natriuretic Peptide: 596 pg/mL — ABNORMAL HIGH (ref <300.0)

## 2024-09-03 LAB — PROTIME-INR
INR: 1.2 (ref 0.8–1.2)
Prothrombin Time: 15.6 s — ABNORMAL HIGH (ref 11.4–15.2)

## 2024-09-03 LAB — CK: Total CK: 260 U/L — ABNORMAL HIGH (ref 38–234)

## 2024-09-03 MED ORDER — MORPHINE SULFATE (PF) 2 MG/ML IV SOLN: 2.0000 mg | INTRAVENOUS | Status: DC | PRN

## 2024-09-03 MED ORDER — CHLORHEXIDINE GLUCONATE CLOTH 2 % EX PADS
6.0000 | MEDICATED_PAD | Freq: Every day | CUTANEOUS | Status: DC
  Administered 2024-09-04 – 2024-09-07 (×4): 6 via TOPICAL

## 2024-09-03 MED ORDER — ALPRAZOLAM 0.25 MG PO TABS: 0.2500 mg | ORAL_TABLET | Freq: Three times a day (TID) | ORAL | Status: DC | PRN

## 2024-09-03 MED ORDER — ACETAMINOPHEN 650 MG RE SUPP: 650.0000 mg | Freq: Four times a day (QID) | RECTAL | Status: AC | PRN

## 2024-09-03 MED ORDER — FUROSEMIDE 10 MG/ML IJ SOLN
40.0000 mg | Freq: Once | INTRAMUSCULAR | Status: AC
  Administered 2024-09-03: 40 mg via INTRAVENOUS
  Filled 2024-09-03: qty 4

## 2024-09-03 MED ORDER — SODIUM CHLORIDE 0.9% FLUSH: 10.0000 mL | INTRAVENOUS | Status: DC | PRN

## 2024-09-03 MED ORDER — ONDANSETRON HCL 4 MG/2ML IJ SOLN: 4.0000 mg | Freq: Four times a day (QID) | INTRAMUSCULAR | Status: DC | PRN

## 2024-09-03 MED ORDER — MORPHINE SULFATE (PF) 4 MG/ML IV SOLN
4.0000 mg | Freq: Once | INTRAVENOUS | Status: DC
  Filled 2024-09-03: qty 1

## 2024-09-03 MED ORDER — POTASSIUM CHLORIDE CRYS ER 20 MEQ PO TBCR
40.0000 meq | EXTENDED_RELEASE_TABLET | Freq: Once | ORAL | Status: AC
  Administered 2024-09-03: 40 meq via ORAL
  Filled 2024-09-03: qty 2

## 2024-09-03 MED ORDER — ONDANSETRON HCL 4 MG PO TABS: 4.0000 mg | ORAL_TABLET | Freq: Four times a day (QID) | ORAL | Status: DC | PRN

## 2024-09-03 MED ORDER — IOHEXOL 300 MG/ML  SOLN
100.0000 mL | Freq: Once | INTRAMUSCULAR | Status: AC | PRN
  Administered 2024-09-03: 100 mL via INTRAVENOUS

## 2024-09-03 MED ORDER — ACETAMINOPHEN 325 MG PO TABS
650.0000 mg | ORAL_TABLET | Freq: Four times a day (QID) | ORAL | Status: AC | PRN
  Administered 2024-09-04 – 2024-09-05 (×3): 650 mg via ORAL
  Filled 2024-09-03 (×3): qty 2

## 2024-09-03 MED ORDER — ALPRAZOLAM 0.25 MG PO TABS
0.2500 mg | ORAL_TABLET | Freq: Three times a day (TID) | ORAL | Status: DC | PRN
  Administered 2024-09-05 (×2): 0.25 mg via ORAL
  Filled 2024-09-03 (×2): qty 1

## 2024-09-03 MED ORDER — OXYCODONE HCL 5 MG PO TABS
5.0000 mg | ORAL_TABLET | Freq: Four times a day (QID) | ORAL | Status: DC | PRN
  Administered 2024-09-05 – 2024-09-06 (×2): 5 mg via ORAL
  Filled 2024-09-03 (×4): qty 1

## 2024-09-03 NOTE — Progress Notes (Signed)
   09/03/24 2237  BiPAP/CPAP/SIPAP  Reason BIPAP/CPAP not in use Non-compliant (pt does not wear)  BiPAP/CPAP /SiPAP Vitals  Resp (!) 24  MEWS Score/Color  MEWS Score 1  MEWS Score Color Green

## 2024-09-03 NOTE — ED Notes (Signed)
 PT taken to CT.

## 2024-09-03 NOTE — H&P (Signed)
 History and Physical    Patient: Chelsea [name] FMW:[id number] DOB: 11-Mar-1946 DOA: 09/03/2024 DOS: the patient was seen and examined on 09/03/2024 PCP: [name], Tully [name], MD  Patient coming from: Home  Chief Complaint:  Chief Complaint  Patient presents with   Fall   HPI: Chelsea [name] is a 78 y.o. 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  She also complained that her RLE has been getting edematous.  According to the patient, her abdominal pain has also been getting worse and her abdomen is more distended as well.  Denied fever, chills, rhinorrhea, sore throat, wheezing or hemoptysis.  No chest pain, palpitations, diaphoresis, PND or orthopnea. No emesis, diarrhea, constipation, melena or hematochezia.  No dysuria, frequency or hematuria. No polyuria, polydipsia, polyphagia or blurred vision.   Lab work: CBC showed white count 9.2, hemoglobin 9.3 g/dL and platelets 796.  PT was 15.6 and INR 1.2.  Lipase was normal.  Total CK2 160 units/L.  proBNP 596.0 pg/mL.  CMP showed normal glucose, normal renal function, normal electrolytes with a session of a potassium of 3.4 mmol/L.  Total protein 7.0 albumin 3.0 g/dL.  AST 174, ALT 68 and alkaline phosphatase 685 units/L, total bilirubin 1.2 mg/dL.  Imaging: CT head without contrast with no acute intracranial normality.  Age-related atrophy and mild to moderate periventricular and deep cerebral white matter disease.  Moderate calcifications of the carotid siphons.  CT cervical spine no evidence of fracture or acute traumatic injury.  DDD and  posterior changes.  Streaky opacity within the right lung apex favored to represent emphysema.  CT abdomen/pelvis with contrast showing significant interval progression of widespread metastatic disease throughout the liver, with the largest conglomerate mass increasing in size from 6.4 x 5.9 cm to 12.9 x 8.1 cm.  Interval development of mild to moderate ascites.  ED course: Initial vital signs were temperature 98.1 F, pulse 101, respiration 18, BP 119/73 mmHg and O2 sat 100% on room air.  The patient received furosemide 40 mg IVP x 1 dose.  She declined morphine  4 mg IVP x 1.   Review of Systems: As mentioned in the history of present illness. All other systems reviewed and are negative. 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)    Moderate mitral insufficiency    Port-A-Cath in place    Seizures [facility])    Spinal stenosis    Ulcer    Urinary incontinence    Past Surgical History:  Procedure Laterality Date   ABDOMINAL HYSTERECTOMY     BACK SURGERY     due to polio   BREAST BIOPSY Bilateral    neg   BREAST BIOPSY Right 2011   neg/stereo   CARPAL TUNNEL RELEASE     CATARACT EXTRACTION Right 2020   COLONOSCOPY WITH PROPOFOL  N/A 04/04/2021   Procedure: COLONOSCOPY WITH PROPOFOL ;  Surgeon: Janalyn Keene NOVAK, MD;  Location: [facility] ENDOSCOPY;  Service: Endoscopy;  Laterality: N/A;   ESOPHAGOGASTRODUODENOSCOPY (EGD) WITH PROPOFOL  N/A 06/25/2022   Procedure: ESOPHAGOGASTRODUODENOSCOPY (EGD) WITH PROPOFOL ;  Surgeon: Unk Corinn Skiff, MD;  Location: [facility] ENDOSCOPY;  Service: Gastroenterology;  Laterality: N/A;   EYE SURGERY     IR IMAGING GUIDED PORT INSERTION  10/09/2021   Social History:  reports that she has never smoked. She has never used smokeless tobacco. She reports that she does not drink alcohol and does not use drugs.  Allergies  Allergen Reactions    Penicillins     Everything turned black    Family History  Problem Relation Age of Onset   Arthritis Mother    Heart disease Mother    Stroke Mother    Hypertension Mother    COPD Mother    Sudden death Sister    Other Father        unknown medical history   Breast cancer Neg Hx     Prior to Admission medications   Medication Sig Start Date End Date Taking? Authorizing Provider  acetaminophen  (TYLENOL ) 500 MG tablet Take 500 mg by mouth every 6 (six) hours as needed.    [provider]  ALPRAZolam  (XANAX ) 0.5 MG tablet TAKE 1 TABLET BY MOUTH TWICE A DAY AS NEEDED FOR ANXIETY 10.11.23 06/12/24   [name], [name], MD  aspirin  EC 81 MG tablet Take 1 tablet (81 mg total) by mouth daily. Swallow whole. 10/14/22   Antonette Angeline ORN, NP  atorvastatin  (LIPITOR) 10 MG tablet TAKE 1 TABLET BY MOUTH EVERY DAY 05/11/22   Antonette Angeline ORN, NP  busPIRone  (BUSPAR ) 5 MG tablet TAKE 1 TABLET BY MOUTH THREE TIMES A DAY 09/21/22   Antonette Angeline ORN, NP  Cyanocobalamin  (VITAMIN B 12 PO) Take 500 mcg by mouth daily.    [provider]  diclofenac  Sodium (VOLTAREN ) 1 % GEL Apply 4 g topically 4 (four) times daily. 12/17/20   Daphane Rosella, NP  docusate sodium  (COLACE) 100 MG capsule Take 1 capsule (100 mg total) by mouth 2 (two) times daily. 02/25/22   [name], MD  FLUoxetine  (PROZAC ) 20 MG capsule TAKE 3 CAPSULES (60 MG TOTAL) BY MOUTH EVERY MORNING. 09/14/22   Antonette Angeline ORN, NP  gabapentin  (NEURONTIN ) 300 MG capsule TAKE 1 CAPSULE BY MOUTH THREE TIMES A DAY 08/30/23   Vaslow, Zachary K, MD  lidocaine -prilocaine  (EMLA ) cream Apply small amount of cream to port site 1-2 hour prior to chemo treatment. 02/07/24   Lanny Callander, MD  loperamide  (IMODIUM ) 2 MG capsule TAKE 1 CAP BY MOUTH SEE ADMIN INSTRUCTIONS. INITIAL: 4 MG, FOLLOWED BY 2 MG AFTER EACH LOOSE STOOL MAXIMUM: 16 MG/DAY 11/02/22   [name], MD  loratadine  (CLARITIN ) 10 MG tablet Take 1 tablet (10 mg total) by mouth See admin  instructions. Take 1 tablet daily for 4 days after each chemotherapy treatments. 05/05/22   [name], MD  magic mouthwash w/lidocaine  SOLN Take 5 mLs by mouth 4 (four) times daily as needed for mouth pain. Sig: Swish/Swallow 5-10 ml four times a day as needed. Dispense 480 ml. 1RF 11/27/22   [name], MD  meclizine  (ANTIVERT ) 12.5 MG tablet Take 1 tablet (12.5 mg total) by mouth 3 (three) times daily as needed for dizziness. 07/08/21   Antonette Angeline ORN, NP  methocarbamol  (ROBAXIN ) 500 MG tablet Take 1 tablet (500 mg total) by mouth 2 (two) times daily as needed for muscle spasms. 03/14/24   [name], [name], MD  ondansetron  (ZOFRAN ) 8 MG tablet Take 1 tablet (8 mg total) by mouth 2 (two) times daily as needed (Nausea or vomiting). 09/25/21  [name], MD  polyethylene glycol powder (GLYCOLAX /MIRALAX ) 17 GM/SCOOP powder Take 17 g by mouth daily. 01/28/21   Antonette Angeline ORN, NP  potassium chloride  SA (KLOR-CON  M) 20 MEQ tablet TAKE 1 TABLET EVERY DAY 11/04/22   Antonette Angeline ORN, NP  triamterene -hydrochlorothiazide  (MAXZIDE -25) 37.5-25 MG tablet TAKE 1 TABLET EVERY DAY 11/04/22   Antonette Angeline ORN, NP    Physical Exam: Vitals:   09/03/24 1045 09/03/24 1213  BP: 119/73 (!) 145/79  Pulse: (!) 101 81  Resp: 18 16  Temp: 98.1 F (36.7 C)   TempSrc: Oral   SpO2: 100% 98%   Physical Exam Vitals and nursing note reviewed.  Constitutional:      General: She is awake. She is not in acute distress.    Appearance: She is ill-appearing.  HENT:     Head: Normocephalic and atraumatic.     Nose: No rhinorrhea.     Mouth/Throat:     Mouth: Mucous membranes are moist.  Eyes:     General: No scleral icterus.    Pupils: Pupils are equal, round, and reactive to light.  Neck:     Vascular: No JVD.  Cardiovascular:     Rate and Rhythm: Normal rate and regular rhythm.     Heart sounds: S1 normal and S2 normal.  Pulmonary:     Effort: Pulmonary effort is normal.     Breath sounds: Normal breath sounds. No  wheezing, rhonchi or rales.  Abdominal:     General: Abdomen is protuberant. Bowel sounds are normal. There is no distension.     Palpations: Abdomen is soft.     Tenderness: There is abdominal tenderness in the right upper quadrant and left upper quadrant. There is right CVA tenderness and left CVA tenderness. There is no guarding or rebound.  Musculoskeletal:     Cervical back: Neck supple.     Right lower leg: Edema present.     Left lower leg: No edema.  Skin:    General: Skin is warm and dry.  Neurological:     General: No focal deficit present.     Mental Status: She is alert and oriented to person, place, and time.  Psychiatric:        Mood and Affect: Mood normal.        Behavior: Behavior normal. Behavior is cooperative.     Data Reviewed:  Results are pending, will review when available.  03/07/2021 echocardiogram report. IMPRESSIONS:   1. Left ventricular ejection fraction, by estimation, is 60 to 65%. The  left ventricle has normal function. The left ventricle has no regional  wall motion abnormalities. There is mild left ventricular hypertrophy.  Left ventricular diastolic parameters  are consistent with Grade I diastolic dysfunction (impaired relaxation).   2. Right ventricular systolic function is normal. The right ventricular  size is normal.   3. The mitral valve is normal in structure. Mild mitral valve  regurgitation. No evidence of mitral stenosis.   4. The aortic valve is normal in structure. Aortic valve regurgitation is  not visualized. Mild to moderate aortic valve sclerosis/calcification is  present, without any evidence of aortic stenosis.   5. There is borderline dilatation of the ascending aorta, measuring 37  mm.    Assessment and Plan: Principal Problem:   Fall Due to worsening:   Generalized weakness In the setting of worsening:   Metastatic colon cancer to liver (HCC) Observation/MedSurg. Continue IV fluids. Does not want to use  narcotics. - Advised acetaminophen /ibuprofen   should be used sparingly. Oxycodone  and morphine  ordered. Antiemetics as needed. Follow CBC, CMP in AM. Will consult palliative care in AM.  Active Problems:   Edema right lower extremity No evidence of DVT. Cancer induced decrease venous/lymphatic return? Received furosemide 40 mg IVP.    Essential hypertension Awaiting med rec. High diuretic earlier as above.    Hypokalemia Replacing. Follow-up level in AM.    Anxiety and depression Will order alprazolam  as needed. Has been on fluoxetine  60 mg p.o. daily. Awaiting for med reconciliation.    Pure hypercholesterolemia   Aortic atherosclerosis Awaiting med rec. Atorvastatin  may be contraindicated at this point.    OSA (obstructive sleep apnea) CPAP at bedtime.   Advance Care Planning:   Code Status: Full Code   Consults:   Family Communication:   Severity of Illness: The appropriate patient status for this patient is OBSERVATION. Observation status is judged to be reasonable and necessary in order to provide the required intensity of service to ensure the patient's safety. The patient's presenting symptoms, physical exam findings, and initial radiographic and laboratory data in the context of their medical condition is felt to place them at decreased risk for further clinical deterioration. Furthermore, it is anticipated that the patient will be medically stable for discharge from the hospital within 2 midnights of admission.   Author: Alm Dorn Castor, MD 09/03/2024 2:10 PM  For on call review [email or web address].   This document was prepared using Dragon voice recognition software and may contain some unintended transcription errors.

## 2024-09-03 NOTE — ED Triage Notes (Addendum)
 Pt arrives to triage via wheelchair. Pt reportedly feel out of bed this morning. Pt has more weakness than typical baseline, and also has swelling in the RLE. Pt has colon cancer with metastases, and is transitioning to palliative care. After assisting pt to standing, she was able to ambulate at baseline with use of her walker.

## 2024-09-03 NOTE — Progress Notes (Signed)
 Right lower extremity venous duplex has been completed. Preliminary results can be found in CV Proc through chart review.  Results were given to [facility].  09/03/24 11:58 AM Cathlyn Collet RVT

## 2024-09-03 NOTE — Progress Notes (Signed)
 Accessed from ED

## 2024-09-03 NOTE — ED Provider Notes (Signed)
 [place] EMERGENCY DEPARTMENT AT [facility] Provider Note   CSN: [id number] Arrival date & time: 09/03/24  1038     Patient presents with: Chelsea [name]   Chelsea [name] is a 78 y.o. female with h/o metastatic colon cancer, presents to the ER today for evaluation of multiple complaints and fall. Daughter presents with patient and provides the majority of the history.  Patient does live by herself however has an aide that comes in occasionally.  Daughter reports that over the past 3 weeks that she has been having more falls usually with ambulation to the bathroom.  Additionally over this time, she is also been experiencing some bilateral lower leg edema with right greater than left.  Has been complaining about more abdominal pain as well as maybe some upper abdominal distention greater with past 2 to 3 days.  Daughter reports that usually her pain is controlled with Tylenol  given at home however has been unable to been controlled with home medications.  She reports some nausea but no vomiting.  Has been having some dysuria with urgency frequency however this has been chronic.  She denies any black or bloody stools however when asked about constipation, diarrhea, or bowel habits, she is unable to give clear answer.  She does deny any fevers, chest pain, shortness of breath, or cough or cold symptoms however.  Additionally, reports that she has been having more headaches that are frontal in nature more often.  Daughter reports that the care aide has also mentioned that the patient's been complaining of more headaches recently.  She denies any acute visual changes.  Today, she reports that she slid out of bed backwards to the ground hitting her head on one of her night tables drawer poles.  No loss of consciousness.  She called life alert whenever she could not get back up.  Fall Associated symptoms include abdominal pain. Pertinent negatives include no chest pain.       Prior to Admission  medications   Medication Sig Start Date End Date Taking? Authorizing Provider  acetaminophen  (TYLENOL ) 500 MG tablet Take 500 mg by mouth every 6 (six) hours as needed.    [provider]  ALPRAZolam  (XANAX ) 0.5 MG tablet TAKE 1 TABLET BY MOUTH TWICE A DAY AS NEEDED FOR ANXIETY 10.11.23 06/12/24   [name], Tully [name], MD  aspirin  EC 81 MG tablet Take 1 tablet (81 mg total) by mouth daily. Swallow whole. 10/14/22   Antonette Angeline ORN, NP  atorvastatin  (LIPITOR) 10 MG tablet TAKE 1 TABLET BY MOUTH EVERY DAY 05/11/22   Antonette Angeline ORN, NP  busPIRone  (BUSPAR ) 5 MG tablet TAKE 1 TABLET BY MOUTH THREE TIMES A DAY 09/21/22   Antonette Angeline ORN, NP  Cyanocobalamin  (VITAMIN B 12 PO) Take 500 mcg by mouth daily.    [provider]  diclofenac  Sodium (VOLTAREN ) 1 % GEL Apply 4 g topically 4 (four) times daily. 12/17/20   Daphane Rosella, NP  docusate sodium  (COLACE) 100 MG capsule Take 1 capsule (100 mg total) by mouth 2 (two) times daily. 02/25/22   [name], MD  FLUoxetine  (PROZAC ) 20 MG capsule TAKE 3 CAPSULES (60 MG TOTAL) BY MOUTH EVERY MORNING. 09/14/22   Antonette Angeline ORN, NP  gabapentin  (NEURONTIN ) 300 MG capsule TAKE 1 CAPSULE BY MOUTH THREE TIMES A DAY 08/30/23   Vaslow, Zachary K, MD  lidocaine -prilocaine  (EMLA ) cream Apply small amount of cream to port site 1-2 hour prior to chemo treatment. 02/07/24   Lanny,  Onita, MD  loperamide  (IMODIUM ) 2 MG capsule TAKE 1 CAP BY MOUTH SEE ADMIN INSTRUCTIONS. INITIAL: 4 MG, FOLLOWED BY 2 MG AFTER EACH LOOSE STOOL MAXIMUM: 16 MG/DAY 11/02/22   [name], MD  loratadine  (CLARITIN ) 10 MG tablet Take 1 tablet (10 mg total) by mouth See admin instructions. Take 1 tablet daily for 4 days after each chemotherapy treatments. 05/05/22   [name], MD  magic mouthwash w/lidocaine  SOLN Take 5 mLs by mouth 4 (four) times daily as needed for mouth pain. Sig: Swish/Swallow 5-10 ml four times a day as needed. Dispense 480 ml. 1RF 11/27/22   [name], MD  meclizine  (ANTIVERT )  12.5 MG tablet Take 1 tablet (12.5 mg total) by mouth 3 (three) times daily as needed for dizziness. 07/08/21   Antonette Angeline ORN, NP  methocarbamol  (ROBAXIN ) 500 MG tablet Take 1 tablet (500 mg total) by mouth 2 (two) times daily as needed for muscle spasms. 03/14/24   [name], [name], MD  ondansetron  (ZOFRAN ) 8 MG tablet Take 1 tablet (8 mg total) by mouth 2 (two) times daily as needed (Nausea or vomiting). 09/25/21   [name], MD  polyethylene glycol powder (GLYCOLAX /MIRALAX ) 17 GM/SCOOP powder Take 17 g by mouth daily. 01/28/21   Antonette Angeline ORN, NP  potassium chloride  SA (KLOR-CON  M) 20 MEQ tablet TAKE 1 TABLET EVERY DAY 11/04/22   Antonette Angeline ORN, NP  triamterene -hydrochlorothiazide  (MAXZIDE -25) 37.5-25 MG tablet TAKE 1 TABLET EVERY DAY 11/04/22   Antonette Angeline ORN, NP    Allergies: Penicillins    Review of Systems  Constitutional:  Negative for chills and fever.  Respiratory:  Negative for cough.   Cardiovascular:  Negative for chest pain.  Gastrointestinal:  Positive for abdominal distention, abdominal pain and nausea.  See HPI  Updated Vital Signs BP (!) 145/79   Pulse 81   Temp 98.1 F (36.7 C) (Oral)   Resp 16   SpO2 98%   Physical Exam Vitals and nursing note reviewed.  Constitutional:      Appearance: She is not toxic-appearing.  HENT:     Head: Normocephalic and atraumatic.     Comments: Nontender to palpation.  No step-off or deformity.  No battle signs or raccoon eyes.    Mouth/Throat:     Mouth: Mucous membranes are dry.  Eyes:     General: No scleral icterus. Cardiovascular:     Rate and Rhythm: Normal rate.  Pulmonary:     Effort: Pulmonary effort is normal. No respiratory distress.  Abdominal:     Tenderness: There is abdominal tenderness.     Comments: Patient has some diffuse abdominal pain palpation.  Mainly more suprapubic and left-sided.  She has a hardened mass palpated in her epigastric and right upper quadrant area with obvious abnormality seen.   No acute overlying skin changes noted however.  Musculoskeletal:     Cervical back: No tenderness.     Right lower leg: Edema present.     Left lower leg: Edema present.     Comments: Bilateral lower leg edema present up to the knee.  Right significant rater than left.  She has easily palpable DP and PT pulses bilaterally.  Compartments are soft.  Legs appear symmetric and coloration and feel symmetric and temperature.  Well-perfused.  Skin:    General: Skin is warm and dry.  Neurological:     Mental Status: She is alert.     Cranial Nerves: No cranial nerve deficit.     Motor:  Weakness present.     Comments: Generalized weakness but no focal deficit noted.  Patient alert and oriented x 4.  Sensation fully intact and symmetric at her baseline.  No cranial nerve deficit appreciated.     (all labs ordered are listed, but only abnormal results are displayed) Labs Reviewed  PRO BRAIN NATRIURETIC PEPTIDE - Abnormal; Notable for the following components:      Result Value   Pro Brain Natriuretic Peptide 596.0 (*)    All other components within normal limits  CBC WITH DIFFERENTIAL/PLATELET - Abnormal; Notable for the following components:   RBC 3.07 (*)    Hemoglobin 9.3 (*)    HCT 29.6 (*)    RDW 16.5 (*)    All other components within normal limits  COMPREHENSIVE METABOLIC PANEL WITH GFR - Abnormal; Notable for the following components:   Potassium 3.4 (*)    Albumin 3.0 (*)    AST 174 (*)    ALT 68 (*)    Alkaline Phosphatase 685 (*)    All other components within normal limits  PROTIME-INR - Abnormal; Notable for the following components:   Prothrombin Time 15.6 (*)    All other components within normal limits  CK - Abnormal; Notable for the following components:   Total CK 260 (*)    All other components within normal limits  CULTURE, BLOOD (SINGLE)  URINE CULTURE  LIPASE, BLOOD  URINALYSIS, ROUTINE W REFLEX MICROSCOPIC  I-STAT CHEM 8, ED  I-STAT CG4 LACTIC ACID, ED   I-STAT CG4 LACTIC ACID, ED    EKG: None  Radiology: CT ABDOMEN PELVIS W CONTRAST Result Date: 09/03/2024 EXAM: CT ABDOMEN AND PELVIS WITH CONTRAST 09/03/2024 12:40:37 PM TECHNIQUE: CT of the abdomen and pelvis was performed with the administration of 100 mL of iohexol  (OMNIPAQUE ) 300 MG/ML solution. Multiplanar reformatted images are provided for review. Automated exposure control, iterative reconstruction, and/or weight-based adjustment of the mA/kV was utilized to reduce the radiation dose to as low as reasonably achievable. COMPARISON: CT of the chest, abdomen and pelvis dated 04/19/2024. CLINICAL HISTORY: Abdominal pain, acute, nonlocalized; diffuse abdominal pain, worsening abdominal distention. FINDINGS: LOWER CHEST: There are numerous pulmonary nodules within the lung bases, which have significantly increased in size and number in the interim. There are approximately 40 nodules in the visualized lungs. LIVER: There has been significant interval progression of widespread metastatic disease throughout the liver. The largest conglomerate mass has increased in size from approximately 6.4 x 5.9 cm to 12.9 x 8.1 cm. GALLBLADDER AND BILE DUCTS: Gallbladder is unremarkable. No biliary ductal dilatation. SPLEEN: No acute abnormality. PANCREAS: No acute abnormality. ADRENAL GLANDS: No acute abnormality. KIDNEYS, URETERS AND BLADDER: No stones in the kidneys or ureters. No hydronephrosis. No perinephric or periureteral stranding. Urinary bladder is unremarkable. GI AND BOWEL: Stomach demonstrates no acute abnormality. There is no bowel obstruction. PERITONEUM AND RETROPERITONEUM: There has been interval development of mild-to-moderate ascites. No free air. VASCULATURE: Aorta is normal in caliber. LYMPH NODES: No lymphadenopathy. REPRODUCTIVE ORGANS: A cystic right adnexal mass has also significantly increased in size in the interim from 8.9 x 6.5 cm to approximately 12.7 x 10.5 cm. BONES AND SOFT TISSUES:  There are extensive degenerative changes present throughout the thoracolumbar spine. There are no osseous lesions present. No focal soft tissue abnormality. IMPRESSION: 1. Significant interval progression of widespread metastatic disease throughout the liver, with the largest conglomerate mass increasing in size from approximately 6.4 x 5.9 cm to 12.9 x 8.1 cm. 2. Significant interval  progression of numerous pulmonary nodules within the lung bases, with approximately 40 nodules in the visualized lungs. 3. Significant interval progression of a cystic right adnexal mass, increasing in size from 8.9 x 6.5 cm to approximately 12.7 x 10.5 cm. 4. Interval development of mild-to-moderate ascites. Electronically signed by: Evalene Coho MD 09/03/2024 12:59 PM EST RP Workstation: [id number]   CT Head Wo Contrast Result Date: 09/03/2024 EXAM: CT HEAD WITHOUT CONTRAST 09/03/2024 12:40:37 PM TECHNIQUE: CT of the head was performed without the administration of intravenous contrast. Automated exposure control, iterative reconstruction, and/or weight based adjustment of the mA/kV was utilized to reduce the radiation dose to as low as reasonably achievable. COMPARISON: CT of the head dated 02/12/2021. CLINICAL HISTORY: Head trauma, minor (Age >= 65y); Headache, new onset (Age >= 51y); increasing headache with known metastatic cancer, recent call with trauma to back fo the head. FINDINGS: BRAIN AND VENTRICLES: No acute hemorrhage. No evidence of acute infarct. No hydrocephalus. No extra-axial collection. No mass effect or midline shift. There is age-related atrophy and mild-to-moderate periventricular and deep cerebral white matter disease. There is moderate calcification of the carotid siphons. ORBITS: No acute abnormality. The patient is status post right cataract surgery. SINUSES: No acute abnormality. SOFT TISSUES AND SKULL: No acute soft tissue abnormality. No skull fracture. IMPRESSION: 1. No acute intracranial  abnormality. 2. Age-related atrophy and mild-to-moderate periventricular and deep cerebral white matter disease. 3. Moderate calcification of the carotid siphons. Electronically signed by: Evalene Coho MD 09/03/2024 12:53 PM EST RP Workstation: [id number]   CT Cervical Spine Wo Contrast Result Date: 09/03/2024 EXAM: CT CERVICAL SPINE WITHOUT CONTRAST 09/03/2024 12:40:37 PM TECHNIQUE: CT of the cervical spine was performed without the administration of intravenous contrast. Multiplanar reformatted images are provided for review. Automated exposure control, iterative reconstruction, and/or weight based adjustment of the mA/kV was utilized to reduce the radiation dose to as low as reasonably achievable. COMPARISON: CT of the cervical spine dated 11/07/2020. CLINICAL HISTORY: Neck trauma (Age >= 65y). FINDINGS: CERVICAL SPINE: BONES AND ALIGNMENT: No evidence of fracture or acute traumatic injury. The patient is again noted to be status post bilateral decompression laminectomies and bilateral posterolateral spinal fusion of C3 through C6. The orthopedic hardware is intact and unremarkable. 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 There are also anterior osteophytes present. SOFT TISSUES: No prevertebral soft tissue swelling. A left internal jugular chest port is present. LUNGS: There is a streaky opacity within the right lung apex. IMPRESSION: 1. No evidence of fracture or acute traumatic injury. 2. Status post bilateral decompression laminectomies and bilateral posterolateral spinal fusion of C3 through C6 with intact and unremarkable orthopedic hardware. 3. 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 4. Streaky opacity within the right lung apex; if this is favored to represent emphysema and patient is between 79 years old, consider evaluation for a low-dose CT lung cancer  screening program. Electronically signed by: Evalene Coho MD 09/03/2024 12:46 PM EST RP Workstation: [id number]   VAS US  LOWER EXTREMITY VENOUS (DVT) (7a-7p) Result Date: 09/03/2024  Lower Venous DVT Study Patient Name:  MINHA FULCO  Date of Exam:   09/03/2024 Medical Rec #: [id number]           Accession #:    [id number] Date of Birth: August 06, 1946           Patient Gender: F Patient Age:   75 years Exam Location:  [facility] Procedure:      VAS US  LOWER EXTREMITY VENOUS (DVT) Referring Phys: Amedio Bowlby --------------------------------------------------------------------------------  Indications: Edema.  Risk Factors: None identified. Comparison Study: No prior studies. Performing Technologist: Cordella Collet RVT  Examination Guidelines: A complete evaluation includes B-mode imaging, spectral Doppler, color Doppler, and power Doppler as needed of all accessible portions of each vessel. Bilateral testing is considered an integral part of a complete examination. Limited examinations for reoccurring indications may be performed as noted. The reflux portion of the exam is performed with the patient in reverse Trendelenburg.  +---------+---------------+---------+-----------+----------+--------------+ RIGHT    CompressibilityPhasicitySpontaneityPropertiesThrombus Aging +---------+---------------+---------+-----------+----------+--------------+ CFV      Full           Yes      Yes                                 +---------+---------------+---------+-----------+----------+--------------+ SFJ      Full                                                        +---------+---------------+---------+-----------+----------+--------------+ FV Prox  Full                                                        +---------+---------------+---------+-----------+----------+--------------+ FV Mid   Full                                                         +---------+---------------+---------+-----------+----------+--------------+ FV DistalFull                                                        +---------+---------------+---------+-----------+----------+--------------+ PFV      Full                                                        +---------+---------------+---------+-----------+----------+--------------+ POP      Full           Yes      Yes                                 +---------+---------------+---------+-----------+----------+--------------+ PTV      Full                                                        +---------+---------------+---------+-----------+----------+--------------+ PERO     Full                                                        +---------+---------------+---------+-----------+----------+--------------+   +----+---------------+---------+-----------+----------+--------------+  LEFTCompressibilityPhasicitySpontaneityPropertiesThrombus Aging +----+---------------+---------+-----------+----------+--------------+ CFV Full           Yes      Yes                                 +----+---------------+---------+-----------+----------+--------------+     Summary: RIGHT: - There is no evidence of deep vein thrombosis in the lower extremity.  - No cystic structure found in the popliteal fossa.  LEFT: - No evidence of common femoral vein obstruction.   *See table(s) above for measurements and observations.    Preliminary    Procedures   Medications Ordered in the ED  morphine  (PF) 4 MG/ML injection 4 mg (has no administration in time range)  furosemide (LASIX) injection 40 mg (has no administration in time range)  potassium chloride  SA (KLOR-CON  M) CR tablet 40 mEq (has no administration in time range)  iohexol  (OMNIPAQUE ) 300 MG/ML solution 100 mL (100 mLs Intravenous Contrast Given 09/03/24 1218)    Medical Decision Making Amount and/or Complexity of Data Reviewed Labs:  ordered. Radiology: ordered.  Risk Prescription drug management. Decision regarding hospitalization.   78 y.o. female presents to the ER for evaluation of multiple complaints including fall. Differential diagnosis includes but is not limited to trauma, electrolyte abnormality, worsening malignancy, deconditioning. Vital signs mildly elevated BP, otherwise normal. Physical exam as noted above.   Patient does appear intermittently confused however is alert and oriented x 4.  Daughter reports that she feels that she is at her baseline.  Given patient's multiple complaints, this does include a larger workup.  Lab work ordered.  Will order CT of the head and neck given Nexus criteria as well as increase in headaches and head trauma today.  Will order a CT of her abdomen and pelvis for the abdominal distention as well as large palpable mass present in the right upper quadrant.  Will also order DVT study of the right lower leg given that it is more edematous than the left however she has neuro vastly intact distally.  I independently reviewed and interpreted the patient's labs.  CK is mildly elevated to 60.  CBC shows hemoglobin 9.3 with no leukocytosis.  Anemia appears chronic however appears her baseline is around 10.  CMP does show slightly worsening LFTs with an AST of 174, ALT of 68 and alk phos of 65.  Normal total bili.  Albumin 3.0 and potassium 2.4.  Otherwise no electrolyte or LFT abnormality.  Urinalysis only to be collected.  Lipase within normal limits.  PT/INR shows mildly elevated PT of 15.6 with a normal INR.  proBNP is slightly elevated at 596  DVT study is negative.  CT Abd/Pelvis 1. Significant interval progression of widespread metastatic disease throughout the liver, with the largest conglomerate mass increasing in size from approximately 6.4 x 5.9 cm to 12.9 x 8.1 cm. 2. Significant interval progression of numerous pulmonary nodules within the lung bases, with approximately 40 nodules  in the visualized lungs. 3. Significant interval progression of a cystic right adnexal mass, increasing in size from 8.9 x 6.5 cm to approximately 12.7 x 10.5 cm. 4. Interval development of mild-to-moderate ascites. Per radiologist's interpretation.    CT of the head 1. No acute intracranial abnormality. 2. Age-related atrophy and mild-to-moderate periventricular and deep cerebral white matter disease. 3. Moderate calcification of the carotid siphons. Per radiologist's interpretation.      CT cervical spine 1. No evidence of fracture or  acute traumatic injury. 2. Status post bilateral decompression laminectomies and bilateral posterolateral spinal fusion of C3 through C6 with intact and unremarkable orthopedic hardware. 3. 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 4. Streaky opacity within the right lung apex; if this is favored to represent emphysema and patient is between 79 years old, consider evaluation for a low-dose CT lung cancer screening program. Per radiologist's interpretation.    Patient deconditioning, uncontrolled cancer related pain, fluid retention, and generalized weakness, do not feel patient is safe for discharge home and will need inpatient admission for further optimization and treatment.  Portions of this report may have been transcribed using voice recognition software. Every effort was made to ensure accuracy; however, inadvertent computerized transcription errors may be present.    I discussed this case with my attending physician who cosigned this note including patient's presenting symptoms, physical exam, and planned diagnostics and interventions. Attending physician stated agreement with plan or made changes to plan which were implemented.   Attending physician assessed patient at bedside.  Final diagnoses:  Cancer associated pain  Frequent falls  Generalized weakness  Localized swelling of right lower  leg  Bilateral lower extremity edema    ED Discharge Orders     None          Bernis Ernst, [place] 09/03/24 1641    Rogelia Jerilynn RAMAN, MD 09/04/24 [phone]

## 2024-09-04 ENCOUNTER — Observation Stay (HOSPITAL_COMMUNITY): Payer: Medicare (Managed Care)

## 2024-09-04 DIAGNOSIS — E876 Hypokalemia: Secondary | ICD-10-CM | POA: Diagnosis not present

## 2024-09-04 DIAGNOSIS — R296 Repeated falls: Secondary | ICD-10-CM

## 2024-09-04 DIAGNOSIS — C189 Malignant neoplasm of colon, unspecified: Secondary | ICD-10-CM

## 2024-09-04 DIAGNOSIS — R600 Localized edema: Secondary | ICD-10-CM

## 2024-09-04 DIAGNOSIS — R531 Weakness: Secondary | ICD-10-CM | POA: Diagnosis not present

## 2024-09-04 DIAGNOSIS — C787 Secondary malignant neoplasm of liver and intrahepatic bile duct: Secondary | ICD-10-CM | POA: Diagnosis not present

## 2024-09-04 DIAGNOSIS — M6281 Muscle weakness (generalized): Secondary | ICD-10-CM | POA: Diagnosis not present

## 2024-09-04 DIAGNOSIS — C7989 Secondary malignant neoplasm of other specified sites: Secondary | ICD-10-CM | POA: Diagnosis not present

## 2024-09-04 DIAGNOSIS — W19XXXD Unspecified fall, subsequent encounter: Secondary | ICD-10-CM | POA: Diagnosis not present

## 2024-09-04 DIAGNOSIS — Z515 Encounter for palliative care: Secondary | ICD-10-CM | POA: Diagnosis not present

## 2024-09-04 DIAGNOSIS — I10 Essential (primary) hypertension: Secondary | ICD-10-CM

## 2024-09-04 DIAGNOSIS — G4733 Obstructive sleep apnea (adult) (pediatric): Secondary | ICD-10-CM

## 2024-09-04 DIAGNOSIS — C7800 Secondary malignant neoplasm of unspecified lung: Secondary | ICD-10-CM | POA: Diagnosis not present

## 2024-09-04 LAB — CBC
HCT: 29.3 % — ABNORMAL LOW (ref 36.0–46.0)
Hemoglobin: 9.1 g/dL — ABNORMAL LOW (ref 12.0–15.0)
MCH: 30 pg (ref 26.0–34.0)
MCHC: 31.1 g/dL (ref 30.0–36.0)
MCV: 96.7 fL (ref 80.0–100.0)
Platelets: 219 K/uL (ref 150–400)
RBC: 3.03 MIL/uL — ABNORMAL LOW (ref 3.87–5.11)
RDW: 16.7 % — ABNORMAL HIGH (ref 11.5–15.5)
WBC: 7.6 K/uL (ref 4.0–10.5)
nRBC: 0 % (ref 0.0–0.2)

## 2024-09-04 LAB — COMPREHENSIVE METABOLIC PANEL WITH GFR
ALT: 67 U/L — ABNORMAL HIGH (ref 0–44)
AST: 178 U/L — ABNORMAL HIGH (ref 15–41)
Albumin: 2.9 g/dL — ABNORMAL LOW (ref 3.5–5.0)
Alkaline Phosphatase: 684 U/L — ABNORMAL HIGH (ref 38–126)
Anion gap: 7 (ref 5–15)
BUN: 12 mg/dL (ref 8–23)
CO2: 32 mmol/L (ref 22–32)
Calcium: 8.9 mg/dL (ref 8.9–10.3)
Chloride: 101 mmol/L (ref 98–111)
Creatinine, Ser: 0.63 mg/dL (ref 0.44–1.00)
GFR, Estimated: >60 mL/min (ref >60)
Glucose, Bld: 92 mg/dL (ref 70–99)
Potassium: 4.3 mmol/L (ref 3.5–5.1)
Sodium: 140 mmol/L (ref 135–145)
Total Bilirubin: 1.1 mg/dL (ref 0.0–1.2)
Total Protein: 7 g/dL (ref 6.5–8.1)

## 2024-09-04 LAB — ECHOCARDIOGRAM COMPLETE
AR max vel: 2.72 cm2
AV Area VTI: 2.79 cm2
AV Area mean vel: 2.63 cm2
AV Mean grad: 3 mmHg
AV Peak grad: 6.3 mmHg
Ao pk vel: 1.25 m/s
Area-P 1/2: 5.13 cm2
Height: 57 in
S' Lateral: 2.2 cm
Weight: 2268.09 [oz_av]

## 2024-09-04 LAB — URINALYSIS, ROUTINE W REFLEX MICROSCOPIC
Bilirubin Urine: NEGATIVE
Glucose, UA: NEGATIVE mg/dL
Hgb urine dipstick: NEGATIVE
Ketones, ur: NEGATIVE mg/dL
Leukocytes,Ua: NEGATIVE
Nitrite: NEGATIVE
Protein, ur: NEGATIVE mg/dL
Specific Gravity, Urine: 1.02 (ref 1.005–1.030)
pH: 7 (ref 5.0–8.0)

## 2024-09-04 LAB — PHOSPHORUS: Phosphorus: 2 mg/dL — ABNORMAL LOW (ref 2.5–4.6)

## 2024-09-04 LAB — MAGNESIUM: Magnesium: 1.9 mg/dL (ref 1.7–2.4)

## 2024-09-04 MED ORDER — FUROSEMIDE 10 MG/ML IJ SOLN
40.0000 mg | Freq: Every day | INTRAMUSCULAR | Status: DC
  Administered 2024-09-04: 40 mg via INTRAVENOUS
  Filled 2024-09-04 (×2): qty 4

## 2024-09-04 MED ORDER — POTASSIUM & SODIUM PHOSPHATES 280-160-250 MG PO PACK
1.0000 | PACK | Freq: Three times a day (TID) | ORAL | Status: AC
  Administered 2024-09-04 – 2024-09-06 (×8): 1 via ORAL
  Filled 2024-09-04 (×8): qty 1

## 2024-09-04 MED ORDER — ENOXAPARIN SODIUM 40 MG/0.4ML IJ SOSY
40.0000 mg | PREFILLED_SYRINGE | Freq: Every day | INTRAMUSCULAR | Status: DC
  Administered 2024-09-04 – 2024-09-07 (×4): 40 mg via SUBCUTANEOUS
  Filled 2024-09-04 (×4): qty 0.4

## 2024-09-04 NOTE — Consult Note (Signed)
 Palliative Care Consult Note                                  Date: 09/04/2024   Patient Name: Chelsea [name]  DOB: 04/21/1946  MRN: [id number]  Age / Sex: 78 y.o., female  PCP: [name], Tully [name], MD Referring Physician: Cherlyn Labella, MD  Reason for Consultation: Establishing goals of care  HPI/Patient Profile: 78 y.o. 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 She also reported RLE edema, abdominal pain, and abdominal swelling.  CT abdomen/pelvis with contrast showing significant interval progression of widespread metastatic disease throughout the liver, with the largest conglomerate mass increasing in size from 6.4 x 5.9 cm to 12.9 x 8.1 cm. Interval development of mild to moderate ascites.   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)    Moderate mitral insufficiency    OSA (obstructive sleep apnea) 04/02/2021   Port-A-Cath in place    Seizures [facility])    Spinal stenosis    Ulcer    Urinary incontinence     Subjective:   I have reviewed medical records including EPIC notes, labs and imaging, received update from nursing and attending provider and assessed the patient.    I introduced Palliative Medicine as specialized medical care for people living with serious illness. It focuses on providing relief from symptoms and stress of a serious illness. The goal is to improve quality of life for both the patient and the family.  Patient states she would like her daughter Chelsea [name] to be present to discuss diagnosis prognosis, GOC, EOL wishes, disposition and options.  Today's  Discussion: Patient sitting in bed. She had eaten the majority of her breakfast. She shared that she has had several cancer therapies over the years. She sees Dr. Lanny but has also received therapies at [facility]. Her last cancer therapy was stopped in September due to poor tolerance. She was referred to OP palliative at that time- through [facility]. These services have not been established yet.  Prior to this hospitalization the patient lived at home independently. She no longer drives but completes most ADLs independently. She has an aide that helps occasionally. Over the last few weeks the patient has been having more falls. This prompted this hospitalization.  Patient shared she has a large family with three children, six grandchildren, fifteen great grandchildren, and a couple great great grandchildren. She is originally from the [place] area. 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   Spoke to patient's daughter Chelsea by phone. She planned to meet today at 130 but has to reschedule to later this week due to work. We plan to discuss diagnosis prognosis, GOC, EOL wishes, and disposition options at that time. She believes the patient has ACP documents and will look for them tonight. Daughter states she is selected as health care proxy in these documents. Told her that spiritual care can assist with creating new documents if she cannot find these. She has been in touch with [facility] and they plan to see  the patient during this hospitalization to talk about their palliative services.   Questions and concerns were addressed. The family was encouraged to call with questions or concerns.    Review of Systems  Cardiovascular:  Positive for leg swelling.  Gastrointestinal:  Positive for abdominal distention and abdominal pain.  Neurological:  Positive for weakness.    Objective:   Primary Diagnoses: Present on Admission:  Fall  Anxiety and depression   Hypokalemia  OSA (obstructive sleep apnea)  Pure hypercholesterolemia  Metastatic colon cancer to liver [facility])  Essential hypertension  Aortic atherosclerosis  Edema of right lower extremity   Physical Exam Vitals reviewed.  Constitutional:      General: She is not in acute distress.    Appearance: She is ill-appearing.  HENT:     Head: Normocephalic and atraumatic.  Cardiovascular:     Rate and Rhythm: Normal rate.  Pulmonary:     Effort: Pulmonary effort is normal.  Skin:    General: Skin is warm and dry.  Neurological:     Mental Status: She is alert and oriented to person, place, and time.     Vital Signs:  BP (!) 142/72 (BP Location: Left Arm)   Pulse 95   Temp 99.8 F (37.7 C) (Oral)   Resp 18   Ht 4' 9 (1.448 m)   Wt 64.3 kg   SpO2 95%   BMI 30.68 kg/m     Advanced Care Planning:   Existing Vynca/ACP Documentation: None  Primary Decision Maker: PATIENT  Code Status/Advance Care Planning: Full code  Assessment & Plan:   SUMMARY OF RECOMMENDATIONS   Continue full code and scope Plan to have GOC meeting this week when daughter is available PMT will reach out to set meeting time   Discussed with: bedside RN, [facility] liaison, Dr. Cherlyn  Time Total: 65 minutes    Thank you for allowing us  to participate in the care of Chelsea [name] PMT will continue to support holistically.    Signed by: Stephane Palin, NP Palliative Medicine Team  Team Phone # [phone] (Nights/Weekends)  09/04/2024, 12:35 PM

## 2024-09-04 NOTE — Progress Notes (Signed)
 Heart Failure Navigator Progress Note  Assessed for Heart & Vascular TOC clinic readiness.  Patient does not meet criteria due to EF 60-65% , per MD notes patient with . Colon cancer with metastases transition to Palliative care. No HF TOC.   Navigator will sign off at this time.   Stephane Haddock, BSN, Scientist, Clinical (histocompatibility And Immunogenetics) Only

## 2024-09-04 NOTE — Progress Notes (Signed)
 Chelsea [name]   DOB:03-02-1946   FM#:[id number]   RDW#:[id number]  Med/onc follow-up note  Subjective: Patient is well-known to me, under my care for her metastatic colon cancer.  She was last seen by me in October 2025, and I stopped her chemo due to worsening fatigue and performance status.  She is on palliative home care service.  She was admitted to the hospital due to multiple falls at home, and progressive weakness.  Also reports abdominal bloating and pain in the right upper quadrant of stomach.   Objective:  Vitals:   09/04/24 1600 09/04/24 2241  BP:    Pulse:    Resp: 19 (!) 24  Temp:    SpO2:      Body mass index is 30.68 kg/m.  Intake/Output Summary (Last 24 hours) at 09/04/2024 2323 Last data filed at 09/04/2024 1850 Gross per 24 hour  Intake 720 ml  Output 1300 ml  Net -580 ml     Sclerae unicteric  Oropharynx clear  No peripheral adenopathy  Lungs clear -- no rales or rhonchi  Heart regular rate and rhythm  Abdomen distended with significant hepatomegaly and tenderness.  MSK no focal spinal tenderness, no peripheral edema  Neuro nonfocal    CBG (last 3)  No results for input(s): GLUCAP in the last 72 hours.   Labs:  Lab Results  Component Value Date   WBC 7.6 09/04/2024   HGB 9.1 (L) 09/04/2024   HCT 29.3 (L) 09/04/2024   MCV 96.7 09/04/2024   PLT 219 09/04/2024   NEUTROABS 6.9 09/03/2024    Urine Studies No results for input(s): UHGB, CRYS in the last 72 hours.  Invalid input(s): UACOL, UAPR, USPG, UPH, UTP, UGL, UKET, UBIL, UNIT, UROB, ULEU, UEPI, UWBC, URBC, UBAC, CAST, UCOM, [place]  Basic Metabolic Panel: Recent Labs  Lab 09/03/24 1141 09/03/24 1149 09/04/24 0324  NA 138 140 140  K 3.4* 3.3* 4.3  CL 101 98 101  CO2 30  --  32  GLUCOSE 98 95 92  BUN 12 11 12   CREATININE 0.59 0.70 0.63  CALCIUM  9.0  --  8.9  MG  --   --  1.9  PHOS  --   --  2.0*   GFR Estimated Creatinine Clearance: 44  mL/min (by C-G formula based on SCr of 0.63 mg/dL). Liver Function Tests: Recent Labs  Lab 09/03/24 1141 09/04/24 0324  AST 174* 178*  ALT 68* 67*  ALKPHOS 685* 684*  BILITOT 1.2 1.1  PROT 7.0 7.0  ALBUMIN 3.0* 2.9*   Recent Labs  Lab 09/03/24 1141  LIPASE 36   No results for input(s): AMMONIA in the last 168 hours. Coagulation profile Recent Labs  Lab 09/03/24 1141  INR 1.2    CBC: Recent Labs  Lab 09/03/24 1141 09/03/24 1149 09/04/24 0324  WBC 9.2  --  7.6  NEUTROABS 6.9  --   --   HGB 9.3* 10.2* 9.1*  HCT 29.6* 30.0* 29.3*  MCV 96.4  --  96.7  PLT 203  --  219   Cardiac Enzymes: Recent Labs  Lab 09/03/24 1141  CKTOTAL 260*   BNP: Invalid input(s): POCBNP CBG: No results for input(s): GLUCAP in the last 168 hours. D-Dimer No results for input(s): DDIMER in the last 72 hours. Hgb A1c No results for input(s): HGBA1C in the last 72 hours. Lipid Profile No results for input(s): CHOL, HDL, LDLCALC, TRIG, CHOLHDL, LDLDIRECT in the last 72 hours. Thyroid function studies No results for input(s):  TSH, T4TOTAL, T3FREE, THYROIDAB in the last 72 hours.  Invalid input(s): FREET3 Anemia work up No results for input(s): VITAMINB12, FOLATE, FERRITIN, TIBC, IRON, RETICCTPCT in the last 72 hours. Microbiology Recent Results (from the past 240 hours)  Culture, blood (single)     Status: None (Preliminary result)   Collection Time: 09/03/24  2:42 PM   Specimen: BLOOD  Result Value Ref Range Status   Specimen Description   Final    BLOOD PORTA CATH Performed at [facility], [address] [place], [place] [address]    Special Requests   Final    BOTTLES DRAWN AEROBIC AND ANAEROBIC Blood Culture adequate volume Performed at [facility], [address] [place], [place] [address]    Culture   Final    NO GROWTH < 12 HOURS Performed at [facility], [address],  [place], [place] [address]    Report Status PENDING  Incomplete      Studies:  ECHOCARDIOGRAM COMPLETE Result Date: 09/04/2024    ECHOCARDIOGRAM REPORT   Patient Name:   Chelsea [name] Date of Exam: 09/04/2024 Medical Rec #:  [id number]          Height:       57.0 in Accession #:    [id number]         Weight:       141.8 lb Date of Birth:  [birth year]          BSA:          1.554 m Patient Age:    78 years           BP:           142/72 mmHg Patient Gender: F                  HR:           99 bpm. Exam Location:  Inpatient Procedure: 2D Echo and Strain Analysis (Both Spectral and Color Flow Doppler            were utilized during procedure). Indications:    Leg edema  History:        Patient has prior history of Echocardiogram examinations. CHF;                 Risk Factors:Hypertension.  Sonographer:    Charmaine Gaskins Referring Phys: [id number] VIJAYA AKULA IMPRESSIONS  1. Left ventricular ejection fraction, by estimation, is 60 to 65%. The left ventricle has normal function. The left ventricle has no regional wall motion abnormalities. There is mild left ventricular hypertrophy. Left ventricular diastolic parameters are consistent with Grade I diastolic dysfunction (impaired relaxation). The average left ventricular global longitudinal strain is -16.6 %. The global longitudinal strain is normal.  2. Right ventricular systolic function is normal. The right ventricular size is normal. There is normal pulmonary artery systolic pressure.  3. The mitral valve is normal in structure. Trivial mitral valve regurgitation. No evidence of mitral stenosis.  4. The aortic valve is tricuspid. There is mild calcification of the aortic valve. There is mild thickening of the aortic valve. Aortic valve regurgitation is not visualized. Aortic valve sclerosis is present, with no evidence of aortic valve stenosis.  5. The inferior vena cava is normal in size with greater than 50% respiratory variability, suggesting right atrial pressure  of 3 mmHg. Comparison(s): No significant change from prior study. FINDINGS  Left Ventricle: Left ventricular ejection fraction, by estimation, is 60  to 65%. The left ventricle has normal function. The left ventricle has no regional wall motion abnormalities. The average left ventricular global longitudinal strain is -16.6 %. Strain was performed and the global longitudinal strain is normal. The left ventricular internal cavity size was normal in size. There is mild left ventricular hypertrophy. Left ventricular diastolic parameters are consistent with Grade I diastolic dysfunction (impaired relaxation). Right Ventricle: The right ventricular size is normal. No increase in right ventricular wall thickness. Right ventricular systolic function is normal. There is normal pulmonary artery systolic pressure. The tricuspid regurgitant velocity is 2.60 m/s, and  with an assumed right atrial pressure of 3 mmHg, the estimated right ventricular systolic pressure is 30.0 mmHg. Left Atrium: Left atrial size was normal in size. Right Atrium: Right atrial size was normal in size. Pericardium: There is no evidence of pericardial effusion. Mitral Valve: The mitral valve is normal in structure. Trivial mitral valve regurgitation. No evidence of mitral valve stenosis. Tricuspid Valve: The tricuspid valve is normal in structure. Tricuspid valve regurgitation is trivial. No evidence of tricuspid stenosis. Aortic Valve: The aortic valve is tricuspid. There is mild calcification of the aortic valve. There is mild thickening of the aortic valve. There is moderate aortic valve annular calcification. Aortic valve regurgitation is not visualized. Aortic valve sclerosis is present, with no evidence of aortic valve stenosis. Aortic valve mean gradient measures 3.0 mmHg. Aortic valve peak gradient measures 6.2 mmHg. Aortic valve area, by VTI measures 2.79 cm. Pulmonic Valve: The pulmonic valve was not well visualized. Pulmonic valve  regurgitation is mild. No evidence of pulmonic stenosis. Aorta: The aortic root, ascending aorta, aortic arch and descending aorta are all structurally normal, with no evidence of dilitation or obstruction. Venous: The inferior vena cava is normal in size with greater than 50% respiratory variability, suggesting right atrial pressure of 3 mmHg. IAS/Shunts: The atrial septum is grossly normal.  LEFT VENTRICLE PLAX 2D LVIDd:         3.00 cm   Diastology LVIDs:         2.20 cm   LV e' medial:    3.70 cm/s LV PW:         1.20 cm   LV E/e' medial:  17.9 LV IVS:        1.10 cm   LV e' lateral:   4.68 cm/s LVOT diam:     2.10 cm   LV E/e' lateral: 14.1 LV SV:         47 LV SV Index:   30        2D Longitudinal Strain LVOT Area:     3.46 cm  2D Strain GLS (A4C):   -17.0 %                          2D Strain GLS (A3C):   -15.8 %                          2D Strain GLS (A2C):   -17.0 %                          2D Strain GLS Avg:     -16.6 % RIGHT VENTRICLE RV Basal diam:  1.90 cm RV Mid diam:    2.20 cm RV S prime:     9.90 cm/s TAPSE (M-mode): 1.9 cm LEFT ATRIUM  Index        RIGHT ATRIUM          Index LA diam:        2.20 cm 1.42 cm/m   RA Area:     8.42 cm LA Vol (A2C):   37.5 ml 24.13 ml/m  RA Volume:   11.80 ml 7.59 ml/m LA Vol (A4C):   36.7 ml 23.62 ml/m LA Biplane Vol: 39.1 ml 25.16 ml/m  AORTIC VALVE AV Area (Vmax):    2.72 cm AV Area (Vmean):   2.63 cm AV Area (VTI):     2.79 cm AV Vmax:           125.00 cm/s AV Vmean:          88.800 cm/s AV VTI:            0.169 m AV Peak Grad:      6.2 mmHg AV Mean Grad:      3.0 mmHg LVOT Vmax:         98.30 cm/s LVOT Vmean:        67.300 cm/s LVOT VTI:          0.136 m LVOT/AV VTI ratio: 0.80  AORTA Ao Root diam: 3.30 cm Ao Asc diam:  3.10 cm MITRAL VALVE                TRICUSPID VALVE MV Area (PHT): 5.13 cm     TR Peak grad:   27.0 mmHg MV Decel Time: 148 msec     TR Vmax:        260.00 cm/s MV E velocity: 66.10 cm/s MV A velocity: 118.00 cm/s  SHUNTS MV  E/A ratio:  0.56         Systemic VTI:  0.14 m                             Systemic Diam: 2.10 cm Shelda Bruckner MD Electronically signed by Shelda Bruckner MD Signature Date/Time: 09/04/2024/2:18:18 PM    Final    VAS US  LOWER EXTREMITY VENOUS (DVT) (7a-7p) Result Date: 09/04/2024  Lower Venous DVT Study Patient Name:  Chelsea [name]  Date of Exam:   09/03/2024 Medical Rec #: [id number]           Accession #:    [id number] Date of Birth: [birth year]           Patient Gender: F Patient Age:   82 years Exam Location:  [facility] Procedure:      VAS US  LOWER EXTREMITY VENOUS (DVT) Referring Phys: RILEY RANSOM --------------------------------------------------------------------------------  Indications: Edema.  Risk Factors: None identified. Comparison Study: No prior studies. Performing Technologist: Cordella Collet RVT  Examination Guidelines: A complete evaluation includes B-mode imaging, spectral Doppler, color Doppler, and power Doppler as needed of all accessible portions of each vessel. Bilateral testing is considered an integral part of a complete examination. Limited examinations for reoccurring indications may be performed as noted. The reflux portion of the exam is performed with the patient in reverse Trendelenburg.  +---------+---------------+---------+-----------+----------+--------------+ RIGHT    CompressibilityPhasicitySpontaneityPropertiesThrombus Aging +---------+---------------+---------+-----------+----------+--------------+ CFV      Full           Yes      Yes                                 +---------+---------------+---------+-----------+----------+--------------+ SFJ  Full                                                        +---------+---------------+---------+-----------+----------+--------------+ FV Prox  Full                                                         +---------+---------------+---------+-----------+----------+--------------+ FV Mid   Full                                                        +---------+---------------+---------+-----------+----------+--------------+ FV DistalFull                                                        +---------+---------------+---------+-----------+----------+--------------+ PFV      Full                                                        +---------+---------------+---------+-----------+----------+--------------+ POP      Full           Yes      Yes                                 +---------+---------------+---------+-----------+----------+--------------+ PTV      Full                                                        +---------+---------------+---------+-----------+----------+--------------+ PERO     Full                                                        +---------+---------------+---------+-----------+----------+--------------+   +----+---------------+---------+-----------+----------+--------------+ LEFTCompressibilityPhasicitySpontaneityPropertiesThrombus Aging +----+---------------+---------+-----------+----------+--------------+ CFV Full           Yes      Yes                                 +----+---------------+---------+-----------+----------+--------------+     Summary: RIGHT: - There is no evidence of deep vein thrombosis in the lower extremity.  - No cystic structure found in the popliteal fossa.  LEFT: - No evidence of common femoral vein obstruction.   *See table(s) above for measurements and observations. Electronically signed by Debby Robertson on 09/04/2024 at 9:17:21 AM.  Final    CT ABDOMEN PELVIS W CONTRAST Result Date: 09/03/2024 EXAM: CT ABDOMEN AND PELVIS WITH CONTRAST 09/03/2024 12:40:37 PM TECHNIQUE: CT of the abdomen and pelvis was performed with the administration of 100 mL of iohexol  (OMNIPAQUE ) 300 MG/ML solution. Multiplanar  reformatted images are provided for review. Automated exposure control, iterative reconstruction, and/or weight-based adjustment of the mA/kV was utilized to reduce the radiation dose to as low as reasonably achievable. COMPARISON: CT of the chest, abdomen and pelvis dated 04/19/2024. CLINICAL HISTORY: Abdominal pain, acute, nonlocalized; diffuse abdominal pain, worsening abdominal distention. FINDINGS: LOWER CHEST: There are numerous pulmonary nodules within the lung bases, which have significantly increased in size and number in the interim. There are approximately 40 nodules in the visualized lungs. LIVER: There has been significant interval progression of widespread metastatic disease throughout the liver. The largest conglomerate mass has increased in size from approximately 6.4 x 5.9 cm to 12.9 x 8.1 cm. GALLBLADDER AND BILE DUCTS: Gallbladder is unremarkable. No biliary ductal dilatation. SPLEEN: No acute abnormality. PANCREAS: No acute abnormality. ADRENAL GLANDS: No acute abnormality. KIDNEYS, URETERS AND BLADDER: No stones in the kidneys or ureters. No hydronephrosis. No perinephric or periureteral stranding. Urinary bladder is unremarkable. GI AND BOWEL: Stomach demonstrates no acute abnormality. There is no bowel obstruction. PERITONEUM AND RETROPERITONEUM: There has been interval development of mild-to-moderate ascites. No free air. VASCULATURE: Aorta is normal in caliber. LYMPH NODES: No lymphadenopathy. REPRODUCTIVE ORGANS: A cystic right adnexal mass has also significantly increased in size in the interim from 8.9 x 6.5 cm to approximately 12.7 x 10.5 cm. BONES AND SOFT TISSUES: There are extensive degenerative changes present throughout the thoracolumbar spine. There are no osseous lesions present. No focal soft tissue abnormality. IMPRESSION: 1. Significant interval progression of widespread metastatic disease throughout the liver, with the largest conglomerate mass increasing in size from  approximately 6.4 x 5.9 cm to 12.9 x 8.1 cm. 2. Significant interval progression of numerous pulmonary nodules within the lung bases, with approximately 40 nodules in the visualized lungs. 3. Significant interval progression of a cystic right adnexal mass, increasing in size from 8.9 x 6.5 cm to approximately 12.7 x 10.5 cm. 4. Interval development of mild-to-moderate ascites. Electronically signed by: Evalene Coho MD 09/03/2024 12:59 PM EST RP Workstation: [id number]   CT Head Wo Contrast Result Date: 09/03/2024 EXAM: CT HEAD WITHOUT CONTRAST 09/03/2024 12:40:37 PM TECHNIQUE: CT of the head was performed without the administration of intravenous contrast. Automated exposure control, iterative reconstruction, and/or weight based adjustment of the mA/kV was utilized to reduce the radiation dose to as low as reasonably achievable. COMPARISON: CT of the head dated 02/12/2021. CLINICAL HISTORY: Head trauma, minor (Age >= 65y); Headache, new onset (Age >= 51y); increasing headache with known metastatic cancer, recent call with trauma to back fo the head. FINDINGS: BRAIN AND VENTRICLES: No acute hemorrhage. No evidence of acute infarct. No hydrocephalus. No extra-axial collection. No mass effect or midline shift. There is age-related atrophy and mild-to-moderate periventricular and deep cerebral white matter disease. There is moderate calcification of the carotid siphons. ORBITS: No acute abnormality. The patient is status post right cataract surgery. SINUSES: No acute abnormality. SOFT TISSUES AND SKULL: No acute soft tissue abnormality. No skull fracture. IMPRESSION: 1. No acute intracranial abnormality. 2. Age-related atrophy and mild-to-moderate periventricular and deep cerebral white matter disease. 3. Moderate calcification of the carotid siphons. Electronically signed by: Evalene Coho MD 09/03/2024 12:53 PM EST RP Workstation: [id number]   CT Cervical Spine Wo Contrast  Result Date: 09/03/2024 EXAM:  CT CERVICAL SPINE WITHOUT CONTRAST 09/03/2024 12:40:37 PM TECHNIQUE: CT of the cervical spine was performed without the administration of intravenous contrast. Multiplanar reformatted images are provided for review. Automated exposure control, iterative reconstruction, and/or weight based adjustment of the mA/kV was utilized to reduce the radiation dose to as low as reasonably achievable. COMPARISON: CT of the cervical spine dated 11/07/2020. CLINICAL HISTORY: Neck trauma (Age >= 65y). FINDINGS: CERVICAL SPINE: BONES AND ALIGNMENT: No evidence of fracture or acute traumatic injury. The patient is again noted to be status post bilateral decompression laminectomies and bilateral posterolateral spinal fusion of C3 through C6. The orthopedic hardware is intact and unremarkable. 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 There are also anterior osteophytes present. SOFT TISSUES: No prevertebral soft tissue swelling. A left internal jugular chest port is present. LUNGS: There is a streaky opacity within the right lung apex. IMPRESSION: 1. No evidence of fracture or acute traumatic injury. 2. Status post bilateral decompression laminectomies and bilateral posterolateral spinal fusion of C3 through C6 with intact and unremarkable orthopedic hardware. 3. 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 4. Streaky opacity within the right lung apex; if this is favored to represent emphysema and patient is between 79 years old, consider evaluation for a low-dose CT lung cancer screening program. Electronically signed by: Evalene Coho MD 09/03/2024 12:46 PM EST RP Workstation: [id number]    Assessment: 78 y.o. female   Metastatic colon cancer, with disease progression Generalized weakness and falls, secondary to #1 Abdominal pain secondary to metastatic cancer Hypertension Anemia of  chronic disease    Plan:  - I personally reviewed her CT scan images from yesterday and discussed the findings with the patient.  She clearly has had a significant cancer progression in abdomen and the lungs, which is likely the course of her worsening fatigue and falls. - Chemotherapy was stopped a few months ago due to her poor tolerance and worsening performance status. - She is not a candidate for more cancer treatment.  I recommend home hospice service.  I discussed the service logistics, and goals of care with patient in detail.  She is open to home hospice, but would like to discuss with her daughter - I also discussed CODE STATUS, I recommend DNR.  She wants to discuss with her daughter. She did no want me to call her daughter today  - Appreciated palliative care team's input -I will f/u in a few days.    Onita Mattock, MD 09/04/2024

## 2024-09-04 NOTE — Progress Notes (Signed)
 Spoke with patient's daughter, Karna, by phone for an update on patient's status. She also mentioned that the best time for her Tuesday for a palliative/goals of care meeting would be morning or before noon, and Denise plans to have her husband present as well for the discussion.

## 2024-09-04 NOTE — Progress Notes (Signed)
 PT Note  Patient Details Name: Chelsea [name] MRN: [id number] DOB: 1946-01-13   Cancelled Treatment:    Reason Eval/Treat Not Completed: Other (comment). PT arrived 1046 nursing present and reported just administering lasix and getting pt settled in recliner and requested PT return later in the day if able. PT to return as schedule allows and continue to follow acutely.   [place], PT Acute Rehab   [place] VEAR Drone 09/04/2024, 1:26 PM

## 2024-09-04 NOTE — Plan of Care (Signed)

## 2024-09-04 NOTE — Progress Notes (Signed)
 [facility]                                                                               Chelsea [name], is a 78 y.o. female, DOB - 1946-03-26, FMW:[id number] Admit date - 09/03/2024    Outpatient Primary MD for the patient is Chelsea [name], [name], MD  LOS - 0  days    Brief summary    Chelsea [name] is a 78 y.o. 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  She also complained that her RLE has been getting edematous.  According to the patient, her abdominal pain has also been getting worse and her abdomen is more distended as well.   CT cervical spine no evidence of fracture or acute traumatic injury. DDD and posterior changes. Streaky opacity within the right lung apex favored to represent emphysema. CT abdomen/pelvis with contrast showing significant interval progression of widespread metastatic disease throughout the liver, with the largest conglomerate mass increasing in size from 6.4 x 5.9 cm to 12.9 x 8.1 cm. Interval development of mild to moderate ascites.    Assessment & Plan    Assessment and Plan:   Metastatic colon cancer with peritoneal involvement S/p chemotherapy, unfortunately unable to continue due to poor tolerance. CT of the abdomen pelvis showing  Significant interval progression of widespread metastatic disease throughout the liver, with the largest conglomerate mass increasing in size from approximately 6.4 x 5.9 cm to 12.9 x 8.1 cm. Palliative care consulted for goals of care  Generalized weakness and fatigue probably secondary to progression  of cancer. Therapy evaluations ordered  Elevated liver enzymes probably secondary to liver mets.   Right lower extremity edema With mild to moderate ascites Probably from progression of the metastatic cancer. Echo from 2022 showed preserved left ventricular ejection fraction with grade 1 diastolic dysfunction. Repeat ECHO. Another dose of IV Lasix ordered.   Essential hypertension Blood pressure parameters are optimal this morning    Hypokalemia and hypophosphatemia Replaced.    Anemia of chronic disease Anemia from malignancy. Hemoglobin around 9 and stable continue to monitor.  Obstructive sleep apnea Continue CPAP at night.  Estimated body mass index is 30.68 kg/m as calculated from the following:   Height as of this encounter: 4' 9 (1.448 m).   Weight as of this encounter: 64.3 kg.  Code Status: full code.  DVT Prophylaxis:  enoxaparin  (LOVENOX ) injection 40 mg Start: 09/04/24 1200 SCDs Start: 09/03/24 1841   Level of Care: Level of care: Telemetry Family Communication: None at bedside, patient's daughter is going to come this afternoon for goals of care meeting  Disposition Plan:     Remains inpatient appropriate: Pending clinical improvement  Procedures:  Echo  Consultants:   Palliative medicine consult    Antimicrobials:   Anti-infectives (From admission, onward)    None        Medications  Scheduled  Meds:  Chlorhexidine  Gluconate Cloth  6 each Topical Daily   enoxaparin  (LOVENOX ) injection  40 mg Subcutaneous Daily   furosemide   40 mg Intravenous Daily   potassium & sodium phosphates   1 packet Oral TID WC & HS   Continuous Infusions: PRN Meds:.acetaminophen  **OR** acetaminophen , ALPRAZolam , morphine  injection, ondansetron  **OR** ondansetron  (ZOFRAN ) IV, oxyCODONE , sodium chloride  flush    Subjective:   Chelsea [name] was seen and examined today.  Patient reports having mild to moderate abdominal pain mostly in the right upper  quadrant.  Objective:   Vitals:   09/03/24 1845 09/03/24 2041 09/03/24 2237 09/04/24 0530  BP: 135/80 112/74  (!) 142/72  Pulse: 94 95  95  Resp: 20 16 (!) 24 18  Temp: 98.3 F (36.8 C) 100.2 F (37.9 C)  99.8 F (37.7 C)  TempSrc: Oral Oral  Oral  SpO2: 98% 98%  95%  Weight:      Height:        Intake/Output Summary (Last 24 hours) at 09/04/2024 1207 Last data filed at 09/04/2024 0800 Gross per 24 hour  Intake 580 ml  Output 2050 ml  Net -1470 ml   Filed Weights   09/03/24 1735  Weight: 64.3 kg     Exam General: Alert and oriented x 3, NAD Cardiovascular: S1 S2 auscultated, no murmurs, RRR Respiratory: Clear to auscultation bilaterally, no wheezing, rales or rhonchi Gastrointestinal: Soft, mild gen tenderness.  Abdominal distention Ext: pedal edema bilaterally Neuro: AAOx3,    Data Reviewed:  I have personally reviewed following labs and imaging studies   CBC Lab Results  Component Value Date   WBC 7.6 09/04/2024   RBC 3.03 (L) 09/04/2024   HGB 9.1 (L) 09/04/2024   HCT 29.3 (L) 09/04/2024   MCV 96.7 09/04/2024   MCH 30.0 09/04/2024   PLT 219 09/04/2024   MCHC 31.1 09/04/2024   RDW 16.7 (H) 09/04/2024   LYMPHSABS 1.3 09/03/2024   MONOABS 0.9 09/03/2024   EOSABS 0.0 09/03/2024   BASOSABS 0.0 09/03/2024     Last metabolic panel Lab Results  Component Value Date   NA 140 09/04/2024   K 4.3 09/04/2024   CL 101 09/04/2024   CO2 32 09/04/2024   BUN 12 09/04/2024   CREATININE 0.63 09/04/2024   GLUCOSE 92 09/04/2024   GFRNONAA >60 09/04/2024   GFRAA 76 08/13/2020   CALCIUM  8.9 09/04/2024   PHOS 2.0 (L) 09/04/2024   PROT 7.0 09/04/2024   ALBUMIN 2.9 (L) 09/04/2024   BILITOT 1.1 09/04/2024   ALKPHOS 684 (H) 09/04/2024   AST 178 (H) 09/04/2024   ALT 67 (H) 09/04/2024   ANIONGAP 7 09/04/2024    CBG (last 3)  No results for input(s): GLUCAP in the last 72 hours.    Coagulation Profile: Recent Labs  Lab 09/03/24 1141  INR 1.2      Radiology Studies: VAS US  LOWER EXTREMITY VENOUS (DVT) (7a-7p) Result Date: 09/04/2024  Lower Venous DVT Study Patient Name:  Chelsea [name]  Date of Exam:   09/03/2024 Medical Rec #: [id number]           Accession #:    [id number] Date of Birth: June 08, 1946           Patient Gender: F Patient Age:   15 years Exam Location:  [facility] Procedure:      VAS US  LOWER EXTREMITY VENOUS (DVT) Referring Phys: RILEY RANSOM --------------------------------------------------------------------------------  Indications: Edema.  Risk Factors: None identified. Comparison Study: No prior studies. Performing  Technologist: Cordella Collet RVT  Examination Guidelines: A complete evaluation includes B-mode imaging, spectral Doppler, color Doppler, and power Doppler as needed of all accessible portions of each vessel. Bilateral testing is considered an integral part of a complete examination. Limited examinations for reoccurring indications may be performed as noted. The reflux portion of the exam is performed with the patient in reverse Trendelenburg.  +---------+---------------+---------+-----------+----------+--------------+ RIGHT    CompressibilityPhasicitySpontaneityPropertiesThrombus Aging +---------+---------------+---------+-----------+----------+--------------+ CFV      Full           Yes      Yes                                 +---------+---------------+---------+-----------+----------+--------------+ SFJ      Full                                                        +---------+---------------+---------+-----------+----------+--------------+ FV Prox  Full                                                        +---------+---------------+---------+-----------+----------+--------------+ FV Mid   Full                                                        +---------+---------------+---------+-----------+----------+--------------+ FV DistalFull                                                         +---------+---------------+---------+-----------+----------+--------------+ PFV      Full                                                        +---------+---------------+---------+-----------+----------+--------------+ POP      Full           Yes      Yes                                 +---------+---------------+---------+-----------+----------+--------------+ PTV      Full                                                        +---------+---------------+---------+-----------+----------+--------------+ PERO     Full                                                        +---------+---------------+---------+-----------+----------+--------------+   +----+---------------+---------+-----------+----------+--------------+  LEFTCompressibilityPhasicitySpontaneityPropertiesThrombus Aging +----+---------------+---------+-----------+----------+--------------+ CFV Full           Yes      Yes                                 +----+---------------+---------+-----------+----------+--------------+     Summary: RIGHT: - There is no evidence of deep vein thrombosis in the lower extremity.  - No cystic structure found in the popliteal fossa.  LEFT: - No evidence of common femoral vein obstruction.   *See table(s) above for measurements and observations. Electronically signed by Debby Robertson on 09/04/2024 at 9:17:21 AM.    Final    CT ABDOMEN PELVIS W CONTRAST Result Date: 09/03/2024 EXAM: CT ABDOMEN AND PELVIS WITH CONTRAST 09/03/2024 12:40:37 PM TECHNIQUE: CT of the abdomen and pelvis was performed with the administration of 100 mL of iohexol  (OMNIPAQUE ) 300 MG/ML solution. Multiplanar reformatted images are provided for review. Automated exposure control, iterative reconstruction, and/or weight-based adjustment of the mA/kV was utilized to reduce the radiation dose to as low as reasonably achievable. COMPARISON: CT of the chest, abdomen and pelvis dated  04/19/2024. CLINICAL HISTORY: Abdominal pain, acute, nonlocalized; diffuse abdominal pain, worsening abdominal distention. FINDINGS: LOWER CHEST: There are numerous pulmonary nodules within the lung bases, which have significantly increased in size and number in the interim. There are approximately 40 nodules in the visualized lungs. LIVER: There has been significant interval progression of widespread metastatic disease throughout the liver. The largest conglomerate mass has increased in size from approximately 6.4 x 5.9 cm to 12.9 x 8.1 cm. GALLBLADDER AND BILE DUCTS: Gallbladder is unremarkable. No biliary ductal dilatation. SPLEEN: No acute abnormality. PANCREAS: No acute abnormality. ADRENAL GLANDS: No acute abnormality. KIDNEYS, URETERS AND BLADDER: No stones in the kidneys or ureters. No hydronephrosis. No perinephric or periureteral stranding. Urinary bladder is unremarkable. GI AND BOWEL: Stomach demonstrates no acute abnormality. There is no bowel obstruction. PERITONEUM AND RETROPERITONEUM: There has been interval development of mild-to-moderate ascites. No free air. VASCULATURE: Aorta is normal in caliber. LYMPH NODES: No lymphadenopathy. REPRODUCTIVE ORGANS: A cystic right adnexal mass has also significantly increased in size in the interim from 8.9 x 6.5 cm to approximately 12.7 x 10.5 cm. BONES AND SOFT TISSUES: There are extensive degenerative changes present throughout the thoracolumbar spine. There are no osseous lesions present. No focal soft tissue abnormality. IMPRESSION: 1. Significant interval progression of widespread metastatic disease throughout the liver, with the largest conglomerate mass increasing in size from approximately 6.4 x 5.9 cm to 12.9 x 8.1 cm. 2. Significant interval progression of numerous pulmonary nodules within the lung bases, with approximately 40 nodules in the visualized lungs. 3. Significant interval progression of a cystic right adnexal mass, increasing in size from  8.9 x 6.5 cm to approximately 12.7 x 10.5 cm. 4. Interval development of mild-to-moderate ascites. Electronically signed by: Evalene Coho MD 09/03/2024 12:59 PM EST RP Workstation: [id number]   CT Head Wo Contrast Result Date: 09/03/2024 EXAM: CT HEAD WITHOUT CONTRAST 09/03/2024 12:40:37 PM TECHNIQUE: CT of the head was performed without the administration of intravenous contrast. Automated exposure control, iterative reconstruction, and/or weight based adjustment of the mA/kV was utilized to reduce the radiation dose to as low as reasonably achievable. COMPARISON: CT of the head dated 02/12/2021. CLINICAL HISTORY: Head trauma, minor (Age >= 65y); Headache, new onset (Age >= 51y); increasing headache with known metastatic cancer, recent call with trauma to back fo the head. FINDINGS:  BRAIN AND VENTRICLES: No acute hemorrhage. No evidence of acute infarct. No hydrocephalus. No extra-axial collection. No mass effect or midline shift. There is age-related atrophy and mild-to-moderate periventricular and deep cerebral white matter disease. There is moderate calcification of the carotid siphons. ORBITS: No acute abnormality. The patient is status post right cataract surgery. SINUSES: No acute abnormality. SOFT TISSUES AND SKULL: No acute soft tissue abnormality. No skull fracture. IMPRESSION: 1. No acute intracranial abnormality. 2. Age-related atrophy and mild-to-moderate periventricular and deep cerebral white matter disease. 3. Moderate calcification of the carotid siphons. Electronically signed by: Evalene Coho MD 09/03/2024 12:53 PM EST RP Workstation: [id number]   CT Cervical Spine Wo Contrast Result Date: 09/03/2024 EXAM: CT CERVICAL SPINE WITHOUT CONTRAST 09/03/2024 12:40:37 PM TECHNIQUE: CT of the cervical spine was performed without the administration of intravenous contrast. Multiplanar reformatted images are provided for review. Automated exposure control, iterative reconstruction, and/or  weight based adjustment of the mA/kV was utilized to reduce the radiation dose to as low as reasonably achievable. COMPARISON: CT of the cervical spine dated 11/07/2020. CLINICAL HISTORY: Neck trauma (Age >= 65y). FINDINGS: CERVICAL SPINE: BONES AND ALIGNMENT: No evidence of fracture or acute traumatic injury. The patient is again noted to be status post bilateral decompression laminectomies and bilateral posterolateral spinal fusion of C3 through C6. The orthopedic hardware is intact and unremarkable. 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 There are also anterior osteophytes present. SOFT TISSUES: No prevertebral soft tissue swelling. A left internal jugular chest port is present. LUNGS: There is a streaky opacity within the right lung apex. IMPRESSION: 1. No evidence of fracture or acute traumatic injury. 2. Status post bilateral decompression laminectomies and bilateral posterolateral spinal fusion of C3 through C6 with intact and unremarkable orthopedic hardware. 3. 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 4. Streaky opacity within the right lung apex; if this is favored to represent emphysema and patient is between 79 years old, consider evaluation for a low-dose CT lung cancer screening program. Electronically signed by: Evalene Coho MD 09/03/2024 12:46 PM EST RP Workstation: [id number]       Elgie Butter M.D. [facility] 09/04/2024, 12:07 PM  Available via Epic secure chat 7am-7pm After 7 pm, please refer to night coverage provider listed on amion.

## 2024-09-04 NOTE — Evaluation (Signed)
 Physical Therapy Evaluation Patient Details Name: Chelsea [name] MRN: [id number] DOB: 18-Jul-1946 Today's Date: 09/04/2024  History of Present Illness  78 yo female presents to therapy following hospital admission on 09/03/2024 due to fall, progressive weakness and B LE edema. Pt recently discontinued medical management for metastatic colon ca and presentation indicative of malignancy with volume overload and abn labs. 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  Clinical Impression    Pt admitted with above diagnosis.  Pt currently with functional limitations due to the deficits listed below (see PT Problem List). Pt in bed when PT returned. Pt agreeable to therapy intervention and motivated to ambulate as well as d/c from hospital. Pt states some abdominal pain with distension noted, pt states the reason she fell OOB was the R LE noted to have functional weakness and edema R > L. Pt required CGA and use of hospital bed with min cues for supine to sit, CGA for sit to stand  from EOB, gait tasks in hallway 180 feet with RW, CGA to close S, cues, slow cadence and poor R LE foot clearance with fatigue. Pt elected to sit up in recliner and all needs in place. Pt will benefit from acute skilled PT to increase their independence and safety with mobility to allow discharge.         If plan is discharge home, recommend the following: A little help with walking and/or transfers;A little help with bathing/dressing/bathroom;Assistance with cooking/housework;Assist for transportation   Can travel by private vehicle        Equipment Recommendations None recommended by PT  Recommendations for Other Services       Functional Status Assessment Patient has had a recent decline in their functional status and demonstrates the ability  to make significant improvements in function in a reasonable and predictable amount of time.     Precautions / Restrictions Precautions Precautions: Fall Restrictions Weight Bearing Restrictions Per Provider Order: No      Mobility  Bed Mobility Overal bed mobility: Needs Assistance Bed Mobility: Supine to Sit     Supine to sit: Contact guard, HOB elevated, Used rails     General bed mobility comments: min cues    Transfers Overall transfer level: Needs assistance Equipment used: Rolling walker (2 wheels) Transfers: Sit to/from Stand Sit to Stand: Contact guard assist           General transfer comment: min cues    Ambulation/Gait Ambulation/Gait assistance: Contact guard assist Gait Distance (Feet): 180 Feet Assistive device: Rolling walker (2 wheels) Gait Pattern/deviations: Step-to pattern, Trunk flexed, Wide base of support Gait velocity: decreased     General Gait Details: slight trunk flexion with min cues for safety, RW management, noted to have R LE coordination and functional strength deficits with mobiltiy as pt demonstrating R toe out with limited foot clearance and with fatigue sliding R foot on floor  Stairs            Wheelchair Mobility     Tilt Bed    Modified Rankin (Stroke Patients Only)       Balance Overall balance assessment: History of Falls, Mild deficits observed, not formally tested  Pertinent Vitals/Pain Pain Assessment Pain Assessment: Faces Faces Pain Scale: Hurts little more Pain Location: abdomen Pain Descriptors / Indicators: Constant, Discomfort Pain Intervention(s): Limited activity within patient's tolerance, Monitored during session, Repositioned    Home Living Family/patient expects to be discharged to:: Private residence Living Arrangements: Alone Available Help at Discharge: Family;Personal care attendant Type of Home: Apartment Home Access:  Level entry       Home Layout: One level Home Equipment: Agricultural Consultant (2 wheels);Rollator (4 wheels);Cane - single point Additional Comments: [employer] indendent living    Prior Function Prior Level of Function : Needs assist;History of Falls (last six months)             Mobility Comments: pt reports amb with rollator in home and community, pt is mod I for BADLs and self care, assist for IADLs ADLs Comments: PCA 2 days a wk     Extremity/Trunk Assessment        Lower Extremity Assessment Lower Extremity Assessment: Generalized weakness (R LE edema and decreased gross MMT)    Cervical / Trunk Assessment Cervical / Trunk Assessment: Back Surgery  Communication   Communication Communication: No apparent difficulties    Cognition Arousal: Alert Behavior During Therapy: WFL for tasks assessed/performed   PT - Cognitive impairments: No apparent impairments                       PT - Cognition Comments: some mild confusion initially when establishing PLOF Following commands: Intact       Cueing       General Comments      Exercises     Assessment/Plan    PT Assessment Patient needs continued PT services  PT Problem List Decreased activity tolerance;Decreased balance;Decreased mobility;Decreased coordination;Decreased strength;Pain       PT Treatment Interventions DME instruction;Gait training;Functional mobility training;Therapeutic activities;Therapeutic exercise;Balance training;Neuromuscular re-education;Patient/family education    PT Goals (Current goals can be found in the Care Plan section)  Acute Rehab PT Goals Patient Stated Goal: to get out of her in less than 6 days PT Goal Formulation: With patient Time For Goal Achievement: 09/18/24 Potential to Achieve Goals: Good    Frequency Min 3X/week     Co-evaluation               AM-PAC PT 6 Clicks Mobility  Outcome Measure Help needed turning from your back to your side  while in a flat bed without using bedrails?: None Help needed moving from lying on your back to sitting on the side of a flat bed without using bedrails?: A Little Help needed moving to and from a bed to a chair (including a wheelchair)?: A Little Help needed standing up from a chair using your arms (e.g., wheelchair or bedside chair)?: A Little Help needed to walk in hospital room?: A Little Help needed climbing 3-5 steps with a railing? : Total 6 Click Score: 17    End of Session Equipment Utilized During Treatment: Gait belt Activity Tolerance: Patient tolerated treatment well;No increased pain Patient left: in chair;with call bell/phone within reach Nurse Communication: Mobility status PT Visit Diagnosis: Unsteadiness on feet (R26.81);Other abnormalities of gait and mobility (R26.89);Muscle weakness (generalized) (M62.81);Difficulty in walking, not elsewhere classified (R26.2);Pain Pain - part of body:  (abdomen)    Time: 8357-8293 PT Time Calculation (min) (ACUTE ONLY): 24 min   Charges:   PT Evaluation $PT Eval Low Complexity: 1 Low PT Treatments $Gait Training: 8-22 mins PT General Charges $$  ACUTE PT VISIT: 1 Visit         [place], PT Acute Rehab   [place] VEAR Drone 09/04/2024, 5:52 PM

## 2024-09-04 NOTE — Progress Notes (Signed)
   09/04/24 2241  BiPAP/CPAP/SIPAP  BiPAP/CPAP/SIPAP Pt Type Adult  Reason BIPAP/CPAP not in use Non-compliant (Pt refused to wear cpap tonight)  BiPAP/CPAP /SiPAP Vitals  Resp (!) 24  MEWS Score/Color  MEWS Score 1  MEWS Score Color Landy

## 2024-09-05 ENCOUNTER — Encounter: Payer: Self-pay | Admitting: Hematology

## 2024-09-05 DIAGNOSIS — W19XXXD Unspecified fall, subsequent encounter: Secondary | ICD-10-CM | POA: Diagnosis not present

## 2024-09-05 DIAGNOSIS — Z7189 Other specified counseling: Secondary | ICD-10-CM

## 2024-09-05 DIAGNOSIS — C7989 Secondary malignant neoplasm of other specified sites: Secondary | ICD-10-CM

## 2024-09-05 DIAGNOSIS — G893 Neoplasm related pain (acute) (chronic): Secondary | ICD-10-CM | POA: Diagnosis not present

## 2024-09-05 DIAGNOSIS — C7800 Secondary malignant neoplasm of unspecified lung: Secondary | ICD-10-CM

## 2024-09-05 DIAGNOSIS — M6281 Muscle weakness (generalized): Secondary | ICD-10-CM

## 2024-09-05 DIAGNOSIS — R4589 Other symptoms and signs involving emotional state: Secondary | ICD-10-CM

## 2024-09-05 DIAGNOSIS — C189 Malignant neoplasm of colon, unspecified: Secondary | ICD-10-CM | POA: Diagnosis not present

## 2024-09-05 DIAGNOSIS — R531 Weakness: Secondary | ICD-10-CM | POA: Diagnosis not present

## 2024-09-05 DIAGNOSIS — Z515 Encounter for palliative care: Secondary | ICD-10-CM

## 2024-09-05 LAB — CBC WITH DIFFERENTIAL/PLATELET
Abs Immature Granulocytes: 0.04 K/uL (ref 0.00–0.07)
Basophils Absolute: 0 K/uL (ref 0.0–0.1)
Basophils Relative: 0 %
Eosinophils Absolute: 0.1 K/uL (ref 0.0–0.5)
Eosinophils Relative: 1 %
HCT: 30.5 % — ABNORMAL LOW (ref 36.0–46.0)
Hemoglobin: 9.6 g/dL — ABNORMAL LOW (ref 12.0–15.0)
Immature Granulocytes: 0 %
Lymphocytes Relative: 12 %
Lymphs Abs: 1.1 K/uL (ref 0.7–4.0)
MCH: 30.6 pg (ref 26.0–34.0)
MCHC: 31.5 g/dL (ref 30.0–36.0)
MCV: 97.1 fL (ref 80.0–100.0)
Monocytes Absolute: 1.1 K/uL — ABNORMAL HIGH (ref 0.1–1.0)
Monocytes Relative: 11 %
Neutro Abs: 7.1 K/uL (ref 1.7–7.7)
Neutrophils Relative %: 76 %
Platelets: 222 K/uL (ref 150–400)
RBC: 3.14 MIL/uL — ABNORMAL LOW (ref 3.87–5.11)
RDW: 16.8 % — ABNORMAL HIGH (ref 11.5–15.5)
WBC: 9.4 K/uL (ref 4.0–10.5)
nRBC: 0 % (ref 0.0–0.2)

## 2024-09-05 LAB — MAGNESIUM: Magnesium: 2 mg/dL (ref 1.7–2.4)

## 2024-09-05 LAB — BASIC METABOLIC PANEL WITH GFR
Anion gap: 8 (ref 5–15)
BUN: 12 mg/dL (ref 8–23)
CO2: 31 mmol/L (ref 22–32)
Calcium: 9.1 mg/dL (ref 8.9–10.3)
Chloride: 100 mmol/L (ref 98–111)
Creatinine, Ser: 0.61 mg/dL (ref 0.44–1.00)
GFR, Estimated: >60 mL/min (ref >60)
Glucose, Bld: 111 mg/dL — ABNORMAL HIGH (ref 70–99)
Potassium: 3.9 mmol/L (ref 3.5–5.1)
Sodium: 139 mmol/L (ref 135–145)

## 2024-09-05 LAB — URINE CULTURE

## 2024-09-05 LAB — PHOSPHORUS: Phosphorus: 2.5 mg/dL (ref 2.5–4.6)

## 2024-09-05 MED ORDER — GABAPENTIN 300 MG PO CAPS
300.0000 mg | ORAL_CAPSULE | Freq: Every day | ORAL | Status: DC
  Administered 2024-09-05 – 2024-09-07 (×3): 300 mg via ORAL
  Filled 2024-09-05 (×3): qty 1

## 2024-09-05 MED ORDER — POLYETHYLENE GLYCOL 3350 17 G PO PACK: 17.0000 g | PACK | Freq: Every day | ORAL | Status: DC | PRN

## 2024-09-05 MED ORDER — METHOCARBAMOL 500 MG PO TABS: 500.0000 mg | ORAL_TABLET | Freq: Two times a day (BID) | ORAL | Status: DC | PRN

## 2024-09-05 MED ORDER — FLUOXETINE HCL 20 MG PO CAPS
60.0000 mg | ORAL_CAPSULE | Freq: Every morning | ORAL | Status: DC
  Administered 2024-09-05 – 2024-09-07 (×3): 60 mg via ORAL
  Filled 2024-09-05 (×3): qty 3

## 2024-09-05 MED ORDER — LORATADINE 10 MG PO TABS: 10.0000 mg | ORAL_TABLET | Freq: Every day | ORAL | Status: DC | PRN

## 2024-09-05 MED ORDER — BUSPIRONE HCL 5 MG PO TABS
5.0000 mg | ORAL_TABLET | Freq: Three times a day (TID) | ORAL | Status: DC
  Administered 2024-09-05 – 2024-09-07 (×7): 5 mg via ORAL
  Filled 2024-09-05 (×7): qty 1

## 2024-09-05 MED ORDER — DICLOFENAC SODIUM 1 % EX GEL: 4.0000 g | Freq: Four times a day (QID) | CUTANEOUS | Status: DC | PRN

## 2024-09-05 MED ORDER — TRIAMTERENE-HCTZ 37.5-25 MG PO TABS
1.0000 | ORAL_TABLET | Freq: Every day | ORAL | Status: DC
  Administered 2024-09-05 – 2024-09-07 (×3): 1 via ORAL
  Filled 2024-09-05 (×3): qty 1

## 2024-09-05 NOTE — Progress Notes (Signed)
 Mobility Specialist Progress Note:   09/05/24 1126  Mobility  Activity Ambulated with assistance  Level of Assistance Contact guard assist, steadying assist  Assistive Device Front wheel walker  Distance Ambulated (ft) 160 ft  Activity Response Tolerated well  Mobility Referral Yes  Mobility visit 1 Mobility  Mobility Specialist Start Time (ACUTE ONLY) 1055  Mobility Specialist Stop Time (ACUTE ONLY) 1121  Mobility Specialist Time Calculation (min) (ACUTE ONLY) 26 min   Pt was received in bed and agreed to mobility. Pt had one bout of unsteadiness during ambulation but recovered quickly. Returned to recliner with all needs met and call bell in reach. Left in room with tech.  [employer] - Mobility Specialist

## 2024-09-05 NOTE — Progress Notes (Signed)
   09/05/24 2337  BiPAP/CPAP/SIPAP  BiPAP/CPAP/SIPAP Pt Type Adult  Reason BIPAP/CPAP not in use Non-compliant

## 2024-09-05 NOTE — Progress Notes (Signed)
 PT Cancellation Note  Patient Details Name: Chelsea [name] MRN: [id number] DOB: [birth year]   Cancelled Treatment:    Reason Eval/Treat Not Completed: Patient declined, states she ambualted x 2 and is now resting. Darice Potters PT Acute Rehabilitation Services Office [phone] [name] 09/05/2024, 3:32 PM

## 2024-09-05 NOTE — Progress Notes (Signed)
 [facility]                                                                               Chelsea [name], is a 78 y.o. female, DOB - Aug 04, 1946, FMW:[id number] Admit date - 09/03/2024    Outpatient Primary MD for the patient is [name], [name], MD  LOS - 1  days    Brief summary    Chelsea [name] is a 78 y.o. 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  She also complained that her RLE has been getting edematous.  According to the patient, her abdominal pain has also been getting worse and her abdomen is more distended as well.   CT cervical spine no evidence of fracture or acute traumatic injury. DDD and posterior changes. Streaky opacity within the right lung apex favored to represent emphysema. CT abdomen/pelvis with contrast showing significant interval progression of widespread metastatic disease throughout the liver, with the largest conglomerate mass increasing in size from 6.4 x 5.9 cm to 12.9 x 8.1 cm. Interval development of mild to moderate ascites.  Oncology and palliative medicine consulted and on board.    Assessment & Plan    Assessment and Plan:   Metastatic colon cancer with peritoneal involvement S/p chemotherapy, unfortunately unable to continue due to poor tolerance. CT of the abdomen pelvis showing  Significant interval progression of widespread metastatic disease throughout the liver, with the largest conglomerate mass increasing in size from approximately 6.4 x 5.9 cm to 12.9 x 8.1 cm. Palliative care consulted for goals of care, waiting for  daughter to schedule a meeting for goc discussions.  Unfortunately patient is not a candidate for further treatment due to worsening performance status. Oncology recommends home hospice and DNR.   Generalized weakness and fatigue probably secondary to progression of cancer. Therapy evaluations ordered  Elevated liver enzymes probably secondary to liver mets.   Abdominal pain probably from liver mass expanding the liver capsule.    Right lower extremity edema With mild to moderate ascites Probably from progression of the metastatic cancer. Echo from 2022 showed preserved left ventricular ejection fraction with grade 1 diastolic dysfunction. Echo this admission is unchanged.  US  paracentesis ordered for ascites for symptomatic relief.    Essential hypertension Blood pressure parameters slightly elevated this morning.  Patient on triameterene at home, will be restarted today.    Hypokalemia and hypophosphatemia Replaced. Repeat levels are wnl.    Anemia of chronic disease Anemia from malignancy. Hemoglobin around 9 and stable continue to monitor.  Obstructive sleep apnea Continue CPAP at night.  Estimated body mass index is 30.68 kg/m as calculated from the following:   Height as of this encounter: 4' 9 (1.448 m).   Weight as of this encounter: 64.3 kg.  Code Status: full code.  DVT Prophylaxis:  enoxaparin  (LOVENOX ) injection 40 mg Start: 09/04/24 1200 SCDs Start: 09/03/24 1841  Level of Care: Level of care: Telemetry Family Communication: None at bedside, unable to reach daughter. Left a message on the phone.   Disposition Plan:     Remains inpatient appropriate: Pending clinical improvement  Procedures:  Echo  Consultants:   Palliative medicine consult Oncology.     Antimicrobials:   Anti-infectives (From admission, onward)    None        Medications  Scheduled Meds:  Chlorhexidine Gluconate Cloth  6 each Topical Daily   enoxaparin  (LOVENOX )  injection  40 mg Subcutaneous Daily   furosemide  40 mg Intravenous Daily   potassium & sodium phosphates  1 packet Oral TID WC & HS   Continuous Infusions: PRN Meds:.acetaminophen  **OR** acetaminophen , ALPRAZolam , morphine  injection, ondansetron  **OR** ondansetron  (ZOFRAN ) IV, oxyCODONE , sodium chloride  flush    Subjective:   Chelsea [name] was seen and examined today.  Continues to have abdominal pain. Intermittently. No chest pain or sob.   Objective:   Vitals:   09/04/24 1500 09/04/24 1600 09/04/24 2241 09/05/24 0524  BP:    (!) 155/77  Pulse:    100  Resp: 20 19 (!) 24 20  Temp:    100 F (37.8 C)  TempSrc:    Oral  SpO2:    96%  Weight:      Height:        Intake/Output Summary (Last 24 hours) at 09/05/2024 1205 Last data filed at 09/05/2024 0954 Gross per 24 hour  Intake 320 ml  Output 850 ml  Net -530 ml   Filed Weights   09/03/24 1735  Weight: 64.3 kg     Exam General exam: Appears calm and comfortable  Respiratory system: Clear to auscultation. Respiratory effort normal. Cardiovascular system: S1 & S2 heard, RRR.  Gastrointestinal system: Abdomen is soft, tenderness in the right upper quadrant. Bs+ Central nervous system: Alert and oriented. Extremities: leg pain improving.  Skin: No rashes,  Psychiatry: Mood & affect appropriate.    Data Reviewed:  I have personally reviewed following labs and imaging studies   CBC Lab Results  Component Value Date   WBC 9.4 09/05/2024   RBC 3.14 (L) 09/05/2024   HGB 9.6 (L) 09/05/2024   HCT 30.5 (L) 09/05/2024   MCV 97.1 09/05/2024   MCH 30.6 09/05/2024   PLT 222 09/05/2024   MCHC 31.5 09/05/2024   RDW 16.8 (H) 09/05/2024   LYMPHSABS 1.1 09/05/2024   MONOABS 1.1 (H) 09/05/2024   EOSABS 0.1 09/05/2024   BASOSABS 0.0 09/05/2024     Last metabolic panel Lab Results  Component Value Date   NA 139 09/05/2024   K 3.9 09/05/2024   CL 100 09/05/2024   CO2 31 09/05/2024   BUN 12 09/05/2024    CREATININE 0.61 09/05/2024   GLUCOSE 111 (H) 09/05/2024   GFRNONAA >60 09/05/2024   GFRAA 76 08/13/2020   CALCIUM  9.1 09/05/2024   PHOS 2.5 09/05/2024   PROT 7.0 09/04/2024   ALBUMIN 2.9 (L) 09/04/2024   BILITOT 1.1 09/04/2024   ALKPHOS 684 (H) 09/04/2024   AST 178 (H) 09/04/2024   ALT 67 (H) 09/04/2024   ANIONGAP 8 09/05/2024    CBG (last 3)  No results for input(s): GLUCAP in the last 72 hours.    Coagulation Profile: Recent Labs  Lab 09/03/24 1141  INR 1.2     Radiology Studies: ECHOCARDIOGRAM COMPLETE Result Date: 09/04/2024    ECHOCARDIOGRAM REPORT   Patient Name:   Chelsea [name] Date of Exam: 09/04/2024 Medical Rec #:  [id number]          Height:       57.0 in Accession #:    [id number]         Weight:       141.8 lb Date of Birth:  11-01-1945          BSA:          1.554 m Patient Age:    51 years           BP:           142/72 mmHg Patient Gender: F                  HR:           99 bpm. Exam Location:  Inpatient Procedure: 2D Echo and Strain Analysis (Both Spectral and Color Flow Doppler            were utilized during procedure). Indications:    Leg edema  History:        Patient has prior history of Echocardiogram examinations. CHF;                 Risk Factors:Hypertension.  Sonographer:    Charmaine Gaskins Referring Phys: [id number] Nicholl Onstott IMPRESSIONS  1. Left ventricular ejection fraction, by estimation, is 60 to 65%. The left ventricle has normal function. The left ventricle has no regional wall motion abnormalities. There is mild left ventricular hypertrophy. Left ventricular diastolic parameters are consistent with Grade I diastolic dysfunction (impaired relaxation). The average left ventricular global longitudinal strain is -16.6 %. The global longitudinal strain is normal.  2. Right ventricular systolic function is normal. The right ventricular size is normal. There is normal pulmonary artery systolic pressure.  3. The mitral valve is normal in structure. Trivial  mitral valve regurgitation. No evidence of mitral stenosis.  4. The aortic valve is tricuspid. There is mild calcification of the aortic valve. There is mild thickening of the aortic valve. Aortic valve regurgitation is not visualized. Aortic valve sclerosis is present, with no evidence of aortic valve stenosis.  5. The inferior vena cava is normal in size with greater than 50% respiratory variability, suggesting right atrial pressure of 3 mmHg. Comparison(s): No significant change from prior study. FINDINGS  Left Ventricle: Left ventricular ejection fraction, by estimation, is 60 to 65%. The left ventricle has normal function. The left ventricle has no regional wall motion abnormalities. The average left ventricular global longitudinal strain is -16.6 %. Strain was performed and the global longitudinal strain is normal. The left ventricular internal cavity size was normal in size. There is mild left ventricular hypertrophy. Left ventricular diastolic parameters are consistent with Grade I diastolic dysfunction (impaired relaxation). Right Ventricle: The right ventricular size is normal. No increase in right ventricular wall thickness. Right ventricular systolic function is normal. There is normal pulmonary artery systolic pressure. The tricuspid regurgitant velocity is 2.60 m/s, and  with an assumed right atrial pressure of 3 mmHg, the estimated right ventricular systolic pressure is 30.0 mmHg. Left Atrium: Left atrial size was normal in size. Right Atrium: Right atrial size was normal in size. Pericardium: There is no evidence of pericardial effusion. Mitral Valve: The mitral valve is normal in structure. Trivial mitral valve regurgitation. No evidence of mitral valve stenosis. Tricuspid Valve: The tricuspid valve is normal in structure. Tricuspid valve regurgitation is trivial. No evidence of tricuspid stenosis. Aortic Valve: The aortic valve is tricuspid. There is mild calcification of the aortic  valve. There is  mild thickening of the aortic valve. There is moderate aortic valve annular calcification. Aortic valve regurgitation is not visualized. Aortic valve sclerosis is present, with no evidence of aortic valve stenosis. Aortic valve mean gradient measures 3.0 mmHg. Aortic valve peak gradient measures 6.2 mmHg. Aortic valve area, by VTI measures 2.79 cm. Pulmonic Valve: The pulmonic valve was not well visualized. Pulmonic valve regurgitation is mild. No evidence of pulmonic stenosis. Aorta: The aortic root, ascending aorta, aortic arch and descending aorta are all structurally normal, with no evidence of dilitation or obstruction. Venous: The inferior vena cava is normal in size with greater than 50% respiratory variability, suggesting right atrial pressure of 3 mmHg. IAS/Shunts: The atrial septum is grossly normal.  LEFT VENTRICLE PLAX 2D LVIDd:         3.00 cm   Diastology LVIDs:         2.20 cm   LV e' medial:    3.70 cm/s LV PW:         1.20 cm   LV E/e' medial:  17.9 LV IVS:        1.10 cm   LV e' lateral:   4.68 cm/s LVOT diam:     2.10 cm   LV E/e' lateral: 14.1 LV SV:         47 LV SV Index:   30        2D Longitudinal Strain LVOT Area:     3.46 cm  2D Strain GLS (A4C):   -17.0 %                          2D Strain GLS (A3C):   -15.8 %                          2D Strain GLS (A2C):   -17.0 %                          2D Strain GLS Avg:     -16.6 % RIGHT VENTRICLE RV Basal diam:  1.90 cm RV Mid diam:    2.20 cm RV S prime:     9.90 cm/s TAPSE (M-mode): 1.9 cm LEFT ATRIUM             Index        RIGHT ATRIUM          Index LA diam:        2.20 cm 1.42 cm/m   RA Area:     8.42 cm LA Vol (A2C):   37.5 ml 24.13 ml/m  RA Volume:   11.80 ml 7.59 ml/m LA Vol (A4C):   36.7 ml 23.62 ml/m LA Biplane Vol: 39.1 ml 25.16 ml/m  AORTIC VALVE AV Area (Vmax):    2.72 cm AV Area (Vmean):   2.63 cm AV Area (VTI):     2.79 cm AV Vmax:           125.00 cm/s AV Vmean:          88.800 cm/s AV VTI:            0.169 m AV Peak  Grad:      6.2 mmHg AV Mean Grad:      3.0 mmHg LVOT Vmax:         98.30 cm/s LVOT Vmean:        67.300 cm/s LVOT VTI:  0.136 m LVOT/AV VTI ratio: 0.80  AORTA Ao Root diam: 3.30 cm Ao Asc diam:  3.10 cm MITRAL VALVE                TRICUSPID VALVE MV Area (PHT): 5.13 cm     TR Peak grad:   27.0 mmHg MV Decel Time: 148 msec     TR Vmax:        260.00 cm/s MV E velocity: 66.10 cm/s MV A velocity: 118.00 cm/s  SHUNTS MV E/A ratio:  0.56         Systemic VTI:  0.14 m                             Systemic Diam: 2.10 cm Shelda Bruckner MD Electronically signed by Shelda Bruckner MD Signature Date/Time: 09/04/2024/2:18:18 PM    Final    CT ABDOMEN PELVIS W CONTRAST Result Date: 09/03/2024 EXAM: CT ABDOMEN AND PELVIS WITH CONTRAST 09/03/2024 12:40:37 PM TECHNIQUE: CT of the abdomen and pelvis was performed with the administration of 100 mL of iohexol  (OMNIPAQUE ) 300 MG/ML solution. Multiplanar reformatted images are provided for review. Automated exposure control, iterative reconstruction, and/or weight-based adjustment of the mA/kV was utilized to reduce the radiation dose to as low as reasonably achievable. COMPARISON: CT of the chest, abdomen and pelvis dated 04/19/2024. CLINICAL HISTORY: Abdominal pain, acute, nonlocalized; diffuse abdominal pain, worsening abdominal distention. FINDINGS: LOWER CHEST: There are numerous pulmonary nodules within the lung bases, which have significantly increased in size and number in the interim. There are approximately 40 nodules in the visualized lungs. LIVER: There has been significant interval progression of widespread metastatic disease throughout the liver. The largest conglomerate mass has increased in size from approximately 6.4 x 5.9 cm to 12.9 x 8.1 cm. GALLBLADDER AND BILE DUCTS: Gallbladder is unremarkable. No biliary ductal dilatation. SPLEEN: No acute abnormality. PANCREAS: No acute abnormality. ADRENAL GLANDS: No acute abnormality. KIDNEYS, URETERS  AND BLADDER: No stones in the kidneys or ureters. No hydronephrosis. No perinephric or periureteral stranding. Urinary bladder is unremarkable. GI AND BOWEL: Stomach demonstrates no acute abnormality. There is no bowel obstruction. PERITONEUM AND RETROPERITONEUM: There has been interval development of mild-to-moderate ascites. No free air. VASCULATURE: Aorta is normal in caliber. LYMPH NODES: No lymphadenopathy. REPRODUCTIVE ORGANS: A cystic right adnexal mass has also significantly increased in size in the interim from 8.9 x 6.5 cm to approximately 12.7 x 10.5 cm. BONES AND SOFT TISSUES: There are extensive degenerative changes present throughout the thoracolumbar spine. There are no osseous lesions present. No focal soft tissue abnormality. IMPRESSION: 1. Significant interval progression of widespread metastatic disease throughout the liver, with the largest conglomerate mass increasing in size from approximately 6.4 x 5.9 cm to 12.9 x 8.1 cm. 2. Significant interval progression of numerous pulmonary nodules within the lung bases, with approximately 40 nodules in the visualized lungs. 3. Significant interval progression of a cystic right adnexal mass, increasing in size from 8.9 x 6.5 cm to approximately 12.7 x 10.5 cm. 4. Interval development of mild-to-moderate ascites. Electronically signed by: Evalene Coho MD 09/03/2024 12:59 PM EST RP Workstation: [id number]   CT Head Wo Contrast Result Date: 09/03/2024 EXAM: CT HEAD WITHOUT CONTRAST 09/03/2024 12:40:37 PM TECHNIQUE: CT of the head was performed without the administration of intravenous contrast. Automated exposure control, iterative reconstruction, and/or weight based adjustment of the mA/kV was utilized to reduce the radiation dose to as low as reasonably achievable. COMPARISON: CT  of the head dated 02/12/2021. CLINICAL HISTORY: Head trauma, minor (Age >= 65y); Headache, new onset (Age >= 51y); increasing headache with known metastatic cancer,  recent call with trauma to back fo the head. FINDINGS: BRAIN AND VENTRICLES: No acute hemorrhage. No evidence of acute infarct. No hydrocephalus. No extra-axial collection. No mass effect or midline shift. There is age-related atrophy and mild-to-moderate periventricular and deep cerebral white matter disease. There is moderate calcification of the carotid siphons. ORBITS: No acute abnormality. The patient is status post right cataract surgery. SINUSES: No acute abnormality. SOFT TISSUES AND SKULL: No acute soft tissue abnormality. No skull fracture. IMPRESSION: 1. No acute intracranial abnormality. 2. Age-related atrophy and mild-to-moderate periventricular and deep cerebral white matter disease. 3. Moderate calcification of the carotid siphons. Electronically signed by: Evalene Coho MD 09/03/2024 12:53 PM EST RP Workstation: [id number]   CT Cervical Spine Wo Contrast Result Date: 09/03/2024 EXAM: CT CERVICAL SPINE WITHOUT CONTRAST 09/03/2024 12:40:37 PM TECHNIQUE: CT of the cervical spine was performed without the administration of intravenous contrast. Multiplanar reformatted images are provided for review. Automated exposure control, iterative reconstruction, and/or weight based adjustment of the mA/kV was utilized to reduce the radiation dose to as low as reasonably achievable. COMPARISON: CT of the cervical spine dated 11/07/2020. CLINICAL HISTORY: Neck trauma (Age >= 65y). FINDINGS: CERVICAL SPINE: BONES AND ALIGNMENT: No evidence of fracture or acute traumatic injury. The patient is again noted to be status post bilateral decompression laminectomies and bilateral posterolateral spinal fusion of C3 through C6. The orthopedic hardware is intact and unremarkable. 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 There are also anterior osteophytes present. SOFT TISSUES: No prevertebral soft tissue swelling. A left internal jugular chest port  is present. LUNGS: There is a streaky opacity within the right lung apex. IMPRESSION: 1. No evidence of fracture or acute traumatic injury. 2. Status post bilateral decompression laminectomies and bilateral posterolateral spinal fusion of C3 through C6 with intact and unremarkable orthopedic hardware. 3. 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 4. Streaky opacity within the right lung apex; if this is favored to represent emphysema and patient is between 79 years old, consider evaluation for a low-dose CT lung cancer screening program. Electronically signed by: Evalene Coho MD 09/03/2024 12:46 PM EST RP Workstation: [id number]       Elgie Butter M.D. [facility] 09/05/2024, 12:05 PM  Available via Epic secure chat 7am-7pm After 7 pm, please refer to night coverage provider listed on amion.

## 2024-09-05 NOTE — Progress Notes (Signed)
 Daily Progress Note   Patient Name: Chelsea [name]       Date: 09/05/2024 DOB: 10-Feb-1946  Age: 78 y.o. MRN#: [id number] Attending Physician: Cherlyn Labella, MD Primary Care Physician: [name], Tully [name], MD Admit Date: 09/03/2024 Length of Stay: 1 day  Reason for Consultation/Follow-up: Establishing goals of care  Subjective:   Reviewed EMR including recent documentation from hospitalist and oncologist.  Oncologist met with patient on 09/04/2024 and discussed patient has had progression of underlying metastatic cancer in abdomen and lungs.  Patient is not a candidate for further cancer directed therapies.  It was recommended that patient receive home hospice support and change CODE STATUS to DNR/DNI.  Patient is considering though wanted to discussed with her daughter.  Presented to bedside to meet with patient today.  No visitors present at bedside.  Introduced myself as a member of the palliative medicine team.  Patient laying comfortably in bed.  Discussed medical updates for today.  Patient noted she greatly appreciated visit from Dr. Lanny yesterday explaining progression of her cancer.  Patient acknowledges that she just wants to have time at her own home at this point.  Patient states she already has a couple of caregivers who come in and assist her and acknowledges she will likely need another 1 to have enough support at home.  Patient acknowledges that she would want hospice support at home.  Patient also discussed that her daughter is incredibly busy with her job and so she has not had the opportunity to discuss this with her daughter.  Patient did give permission for providers to reach out to her daughter to discuss the possibility of going home with hospice support.  Spent time providing emotional support via active listening.  All questions answered at that time.  Noted palliative medicine team would continue to follow in patient's medical journey.  Care team has been  attempting to reach patient's daughter without success.  Discussed care with hospitalist, bedside RN, and oncologist to coordinate care.  Daughter noted to be unavailable during the day today due to obligations with work.  Oncologist planning to reach out to daughter later in the day to discuss possibility of patient going home with hospice support since patient is supporting of this.  Objective:   Vital Signs:  BP (!) 155/77 (BP Location: Right Arm)   Pulse 100   Temp 100 F (37.8 C) (Oral)   Resp 20   Ht 4' 9 (1.448 m)   Wt 64.3 kg   SpO2 96%   BMI 30.68 kg/m   Physical Exam: General: NAD, alert, chronically ill-appearing, pleasant Cardiovascular: RRR Respiratory: no increased work of breathing noted, not in respiratory distress Neuro: A&Ox4, following commands easily Psych: appropriately answers all questions  Assessment & Plan:   Assessment: Patient is a 78 year old female with a past medical history of anxiety, depression, osteoarthritis, hyperlipidemia, hypertension, moderate mitral insufficiency, history of seizures, spinal stenosis, OSA, and metastatic colon cancer to the liver complicated by chemotherapy-induced anemia who was admitted on 09/03/2024 after falling out of her bed due to worsening generalized weakness.  During hospitalization patient received management for right lower extremity edema and worsening burden of metastatic colon cancer with peritoneal involvement with associated ascites.  Palliative medicine team consulted to assist with complex medical decision making.  Recommendations/Plan: # Complex medical decision making/goals of care:  - Discussed care with patient as detailed above in HPI.  Patient voiced appreciation for Dr. Demetra conversation with her yesterday.  Has been explained  that patient has had significant cancer progression in her abdomen and lungs.  Patient is not a candidate for further cancer therapies.  Was recommended patient receive home  hospice services for support moving forward.  Patient was agreeing to home hospice support today though wants to discuss with her daughter.  Care team has been attempting to discuss with daughter though difficulties reaching her due to her work schedule.  Oncology planning to call patient's daughter later today as well.  Palliative medicine team continuing to engage in conversations as able and appropriate moving forward.  -  Code Status: Full Code  # Psychosocial Support:  - Daughter-Denise Roth  # Discharge Planning: To Be Determined  Discussed with: Patient, bedside RN, hospitalist, oncologist  Thank you for allowing the palliative care team to participate in the care Chelsea [name].  Tinnie Radar, DO Palliative Care Provider PMT # [phone]  If patient remains symptomatic despite maximum doses, please call PMT at [phone] between 0700 and 1900. Outside of these hours, please call attending, as PMT does not have night coverage.  Personally spent 35 minutes in patient care including extensive chart review (labs, imaging, progress/consult notes, vital signs), medically appropraite exam, discussed with treatment team, education to patient, family, and staff, documenting clinical information, medication review and management, coordination of care, and available advanced directive documents.

## 2024-09-06 ENCOUNTER — Inpatient Hospital Stay (HOSPITAL_COMMUNITY): Payer: Medicare (Managed Care)

## 2024-09-06 DIAGNOSIS — W19XXXD Unspecified fall, subsequent encounter: Secondary | ICD-10-CM | POA: Diagnosis not present

## 2024-09-06 DIAGNOSIS — R531 Weakness: Secondary | ICD-10-CM | POA: Diagnosis not present

## 2024-09-06 LAB — HEPATIC FUNCTION PANEL
ALT: 63 U/L — ABNORMAL HIGH (ref 0–44)
AST: 163 U/L — ABNORMAL HIGH (ref 15–41)
Albumin: 2.9 g/dL — ABNORMAL LOW (ref 3.5–5.0)
Alkaline Phosphatase: 593 U/L — ABNORMAL HIGH (ref 38–126)
Bilirubin, Direct: 1.1 mg/dL — ABNORMAL HIGH (ref 0.0–0.2)
Indirect Bilirubin: 0.5 mg/dL (ref 0.3–0.9)
Total Bilirubin: 1.5 mg/dL — ABNORMAL HIGH (ref 0.0–1.2)
Total Protein: 6.9 g/dL (ref 6.5–8.1)

## 2024-09-06 MED ORDER — ACETAMINOPHEN 325 MG PO TABS
650.0000 mg | ORAL_TABLET | Freq: Four times a day (QID) | ORAL | Status: DC
  Administered 2024-09-06 – 2024-09-07 (×4): 650 mg via ORAL
  Filled 2024-09-06 (×4): qty 2

## 2024-09-06 MED ORDER — LIDOCAINE-EPINEPHRINE (PF) 2 %-1:200000 IJ SOLN
INTRAMUSCULAR | Status: AC
  Filled 2024-09-06: qty 20

## 2024-09-06 MED ORDER — ACETAMINOPHEN 325 MG PO TABS
650.0000 mg | ORAL_TABLET | Freq: Four times a day (QID) | ORAL | Status: DC | PRN
  Administered 2024-09-06: 650 mg via ORAL
  Filled 2024-09-06: qty 2

## 2024-09-06 NOTE — Plan of Care (Signed)

## 2024-09-06 NOTE — Progress Notes (Signed)
 Oncology brief note  I called the patient's daughter Karna, and spoke with her on the phone today.  She is agreeable with hospice and DNR, she is concerned about her mother's care she needs at home, which she cannot provide.  She has reach out to her caregiver to see if she can increase hours to help her at home.  We discussed hospice benefit and logistics, she spoke with [facility] liaison today, plan to call her back tomorrow morning with her decision on hospice, she plans to bring her mom back home in the next few days.  I answered all her questions, including her overall poor prognosis and life expectancy (a few weeks to a few months) etc.  She appreciated the call.  Onita Mattock MD 09/06/2024

## 2024-09-06 NOTE — Progress Notes (Signed)
 Mobility Specialist - Progress Note   09/06/24 1300  Mobility  Activity Ambulated with assistance  Level of Assistance Minimal assist, patient does 75% or more  Assistive Device Front wheel walker  Distance Ambulated (ft) 144 ft  Range of Motion/Exercises Active  Activity Response Tolerated fair  Mobility visit 1 Mobility  Mobility Specialist Start Time (ACUTE ONLY) 1245  Mobility Specialist Stop Time (ACUTE ONLY) 1300  Mobility Specialist Time Calculation (min) (ACUTE ONLY) 15 min   Pt was found in bed and agreeable to mobilize. Grew fatigued with session and took x1 brief standing rest break. CN Abby obtained recliner chair and we encouraged pt to sit and rest due to fatigue. Pt wheeled back to room and was left on recliner chair with all needs met. Call bell in reach and RN notified.   Erminio Leos,  Mobility Specialist Can be reached via Secure Chat

## 2024-09-06 NOTE — Plan of Care (Signed)
  Daily Progress Note   Patient Name: Chelsea [name]       Date: 09/06/2024 DOB: 1946-03-16  Age: 78 y.o. MRN#: [id number] Attending Physician: Caleen Burgess BROCKS, MD Primary Care Physician: [name], Tully [name], MD Admit Date: 09/03/2024 Length of Stay: 2 days  Discussed care with primary hospitalist today.  Hospitalist had already discussed care with patient and her daughter.  Daughter and patient wanting to discuss possibly going home with support through [facility].  TOC assisting with coordination of this.  Palliative medicine team continuing to follow along with patient's medical journey.   Tinnie Radar, DO Palliative Care Provider PMT # [phone]  No Charge Note

## 2024-09-06 NOTE — Progress Notes (Signed)
 Pt transferred to 1323, hand off report to [place], CHARITY FUNDRAISER. Left message with dgt and relative listed in chart with updated room number. SRP, RN

## 2024-09-06 NOTE — Progress Notes (Signed)
 Patient ID: Chelsea [name], female   DOB: April 08, 1946, 78 y.o.   MRN: [id number] Patient presented to ultrasound today for therapeutic paracentesis.  On limited ultrasound of abdomen there is only a small amount of free peritoneal fluid noted.  Paracentesis canceled.  Patient updated.

## 2024-09-06 NOTE — Progress Notes (Signed)
 [id number] [facility] Liaison Note  Received a request from ICM Duwaine Aran for hospice services at home after discharge. Spoke with daughter, Karna, via telephone to initiate education related to hospice philosophy, services and team approach to care. Karna verbalized understanding of information given and would like to discuss with family regarding living arrangements and additional support before moving forward.  DME needs discussed. Karna will request a hospital bed prior to discharge.  Please call with any questions or concerns.  Thank you for the opportunity to participate in this patient's care.  Inocente Jacobs, BSN, RN [employer] [phone]

## 2024-09-06 NOTE — Progress Notes (Signed)
 PROGRESS NOTE    Chelsea [name]  FMW:[id number] DOB: Jun 02, 1946 DOA: 09/03/2024 PCP: [name], Tully [name], MD    Brief Narrative:   78 y.o. 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  She also complained that her RLE has been getting edematous. CT cervical spine no evidence of fracture or acute traumatic injury. DDD and posterior changes. Streaky opacity within the right lung apex favored to represent emphysema. CT abdomen/pelvis with contrast showing significant interval progression of widespread metastatic disease throughout the liver, with the largest conglomerate mass increasing in size from 6.4 x 5.9 cm to 12.9 x 8.1 cm. Interval development of mild to moderate ascites.  Oncology and palliative medicine consulted and on board.   Assessment & Plan:     Metastatic colon cancer with peritoneal involvement Transaminitis Abdominal pain secondary to metastatic disease Unfortunately patient's condition has progressed and declined due to widespread metastatic cancer despite of getting chemotherapy which was stopped a few months ago due to her poor performance status and tolerance.  She is no longer a candidate for any further treatment per oncology team.  Recommendations are to transition patient to DNR and hospice.  Palliative care team is currently following.    Right lower extremity edema with ascites Due to underlying progression of her metastatic disease.  Echocardiogram essentially shows preserved ejection fraction.  Ultrasound paracentesis ordered for therapeutic purpose      Essential hypertension Currently on Maxide daily.  IV as needed   Hypokalemia and hypophosphatemia Replete as needed     Anemia of chronic disease Anemia from malignancy. Hemoglobin around 9 and stable continue to monitor.   Obstructive sleep apnea Continue CPAP at night.   Estimated body mass index is 30.68 kg/m as calculated from the following:   Height as of this encounter: 4' 9 (1.448 m).   Weight as of this encounter: 64.3 kg.  Unfortunately patient has very poor prognosis.  I suspect life expectancy to be less than 3-6 months   DVT prophylaxis:Lovenox     Code Status: Full Code Family Communication: Daughter at bedside Status is: Inpatient Remains inpatient appropriate because: Plans to hopefully transition her home tomorrow   PT Follow up Recs: Home Health Pt12/10/2023 1700  Subjective: Patient does have any complaints at this time.  Only wants to take Tylenol  for her pain but nothing stronger. I did discuss and recommend the patient should be DNR/DNI.  They should also highly consider transitioning her to hospice and they are agreeable to discuss with hospice team.   Examination:  General exam: Appears calm and comfortable, weak frail cachectic Respiratory system: Clear to auscultation. Respiratory effort normal. Cardiovascular system: S1 & S2 heard, RRR. No JVD, murmurs, rubs, gallops or clicks. No pedal edema. Gastrointestinal system: Abdomen is nondistended, soft and nontender. No organomegaly or masses felt. Normal bowel sounds heard. Central nervous system: Alert and oriented. No focal neurological deficits. Extremities: Symmetric 5 x 5 power. Skin: No rashes, lesions or ulcers Psychiatry: Judgement and insight appear normal. Mood & affect appropriate.                Diet Orders (From admission, onward)     Start     Ordered   09/03/24  1448  Diet Heart Room service appropriate? Yes; Fluid consistency: Thin; Fluid restriction: 1200 mL Fluid   Diet effective now       Question Answer Comment  Room service appropriate? Yes   Fluid consistency: Thin   Fluid restriction: 1200 mL Fluid      09/03/24 1447            Objective: Vitals:   09/04/24 2241 09/05/24 0524 09/05/24 1320 09/06/24 0600  BP:  (!) 155/77 111/64 (!) 151/75  Pulse:  100 97 93  Resp: (!) 24 20    Temp:  100 F (37.8 C) 99.7 F (37.6 C) 98.8 F (37.1 C)  TempSrc:  Oral Oral Oral  SpO2:  96% 95% 96%  Weight:      Height:        Intake/Output Summary (Last 24 hours) at 09/06/2024 1042 Last data filed at 09/05/2024 1330 Gross per 24 hour  Intake 120 ml  Output --  Net 120 ml   Filed Weights   09/03/24 1735  Weight: 64.3 kg    Scheduled Meds:  busPIRone   5 mg Oral TID   Chlorhexidine  Gluconate Cloth  6 each Topical Daily   enoxaparin  (LOVENOX ) injection  40 mg Subcutaneous Daily   FLUoxetine   60 mg Oral q morning   gabapentin   300 mg Oral Daily   triamterene -hydrochlorothiazide   1 tablet Oral Daily   Continuous Infusions:  Nutritional status     Body mass index is 30.68 kg/m.  Data Reviewed:   CBC: Recent Labs  Lab 09/03/24 1141 09/03/24 1149 09/04/24 0324 09/05/24 0404  WBC 9.2  --  7.6 9.4  NEUTROABS 6.9  --   --  7.1  HGB 9.3* 10.2* 9.1* 9.6*  HCT 29.6* 30.0* 29.3* 30.5*  MCV 96.4  --  96.7 97.1  PLT 203  --  219 222   Basic Metabolic Panel: Recent Labs  Lab 09/03/24 1141 09/03/24 1149 09/04/24 0324 09/05/24 0404  NA 138 140 140 139  K 3.4* 3.3* 4.3 3.9  CL 101 98 101 100  CO2 30  --  32 31  GLUCOSE 98 95 92 111*  BUN 12 11 12 12   CREATININE 0.59 0.70 0.63 0.61  CALCIUM  9.0  --  8.9 9.1  MG  --   --  1.9 2.0  PHOS  --   --  2.0* 2.5   GFR: Estimated Creatinine Clearance: 44 mL/min (by C-G formula based on SCr of 0.61 mg/dL). Liver Function Tests: Recent Labs  Lab 09/03/24 1141 09/04/24 0324 09/06/24 0100  AST 174* 178* 163*  ALT 68* 67* 63*  ALKPHOS 685* 684* 593*  BILITOT 1.2 1.1 1.5*  PROT  7.0 7.0 6.9  ALBUMIN 3.0* 2.9* 2.9*   Recent Labs  Lab 09/03/24 1141  LIPASE 36   No results for input(s): AMMONIA in the last 168 hours. Coagulation Profile: Recent Labs  Lab 09/03/24 1141  INR 1.2   Cardiac Enzymes: Recent Labs  Lab 09/03/24 1141  CKTOTAL 260*   BNP (last 3 results) Recent Labs    09/03/24 1141  PROBNP 596.0*   HbA1C: No results for input(s): HGBA1C in the last 72 hours. CBG: No results for input(s): GLUCAP in the last 168 hours. Lipid Profile: No results for input(s): CHOL, HDL, LDLCALC, TRIG, CHOLHDL, LDLDIRECT in the last 72 hours. Thyroid Function Tests: No results for input(s): TSH, T4TOTAL, FREET4, T3FREE, THYROIDAB in the last 72 hours. Anemia Panel: No results for input(s): VITAMINB12, FOLATE, FERRITIN, TIBC,  IRON, RETICCTPCT in the last 72 hours. Sepsis Labs: Recent Labs  Lab 09/03/24 1149 09/03/24 1448  LATICACIDVEN 1.2 1.2    Recent Results (from the past 240 hours)  Culture, blood (single)     Status: None (Preliminary result)   Collection Time: 09/03/24  2:42 PM   Specimen: BLOOD  Result Value Ref Range Status   Specimen Description   Final    BLOOD PORTA CATH Performed at [facility], [address], [place], [place] [address]    Special Requests   Final    BOTTLES DRAWN AEROBIC AND ANAEROBIC Blood Culture adequate volume Performed at [facility], [address], [place], [place] [address]    Culture   Final    NO GROWTH 3 DAYS Performed at [facility], [address] [place], [place] [address]    Report Status PENDING  Incomplete  Urine Culture     Status: Abnormal   Collection Time: 09/04/24  5:00 AM   Specimen: Urine, Clean Catch  Result Value Ref Range Status   Specimen Description   Final    URINE, CLEAN CATCH Performed at [facility], [address], [place], [place] [address]    Special Requests    Final    NONE Performed at [facility], [address], [place], [place] [address]    Culture MULTIPLE SPECIES PRESENT, SUGGEST RECOLLECTION (A)  Final   Report Status 09/05/2024 FINAL  Final         Radiology Studies: ECHOCARDIOGRAM COMPLETE Result Date: 09/04/2024    ECHOCARDIOGRAM REPORT   Patient Name:   Chelsea [name] Date of Exam: 09/04/2024 Medical Rec #:  [id number]          Height:       57.0 in Accession #:    [id number]         Weight:       141.8 lb Date of Birth:  02/18/1946          BSA:          1.554 m Patient Age:    78 years           BP:           142/72 mmHg Patient Gender: F                  HR:           99 bpm. Exam Location:  Inpatient Procedure: 2D Echo and Strain Analysis (Both Spectral and Color Flow Doppler            were utilized during procedure). Indications:    Leg edema  History:        Patient has prior history of Echocardiogram examinations. CHF;                 Risk Factors:Hypertension.  Sonographer:    Charmaine Gaskins Referring Phys: [id number] VIJAYA AKULA IMPRESSIONS  1. Left ventricular ejection fraction, by estimation, is 60 to 65%. The left ventricle has normal function. The left ventricle has no regional wall motion abnormalities. There is mild left ventricular hypertrophy. Left ventricular diastolic parameters are consistent with Grade I diastolic dysfunction (impaired relaxation). The average left ventricular global longitudinal strain is -16.6 %. The global longitudinal strain is normal.  2. Right ventricular systolic function is normal. The right ventricular size is normal. There is normal pulmonary artery systolic pressure.  3. The mitral valve is normal in  structure. Trivial mitral valve regurgitation. No evidence of mitral stenosis.  4. The aortic valve is tricuspid. There is mild calcification of the aortic valve. There is mild thickening of the aortic valve. Aortic valve regurgitation is not visualized. Aortic valve sclerosis is  present, with no evidence of aortic valve stenosis.  5. The inferior vena cava is normal in size with greater than 50% respiratory variability, suggesting right atrial pressure of 3 mmHg. Comparison(s): No significant change from prior study. FINDINGS  Left Ventricle: Left ventricular ejection fraction, by estimation, is 60 to 65%. The left ventricle has normal function. The left ventricle has no regional wall motion abnormalities. The average left ventricular global longitudinal strain is -16.6 %. Strain was performed and the global longitudinal strain is normal. The left ventricular internal cavity size was normal in size. There is mild left ventricular hypertrophy. Left ventricular diastolic parameters are consistent with Grade I diastolic dysfunction (impaired relaxation). Right Ventricle: The right ventricular size is normal. No increase in right ventricular wall thickness. Right ventricular systolic function is normal. There is normal pulmonary artery systolic pressure. The tricuspid regurgitant velocity is 2.60 m/s, and  with an assumed right atrial pressure of 3 mmHg, the estimated right ventricular systolic pressure is 30.0 mmHg. Left Atrium: Left atrial size was normal in size. Right Atrium: Right atrial size was normal in size. Pericardium: There is no evidence of pericardial effusion. Mitral Valve: The mitral valve is normal in structure. Trivial mitral valve regurgitation. No evidence of mitral valve stenosis. Tricuspid Valve: The tricuspid valve is normal in structure. Tricuspid valve regurgitation is trivial. No evidence of tricuspid stenosis. Aortic Valve: The aortic valve is tricuspid. There is mild calcification of the aortic valve. There is mild thickening of the aortic valve. There is moderate aortic valve annular calcification. Aortic valve regurgitation is not visualized. Aortic valve sclerosis is present, with no evidence of aortic valve stenosis. Aortic valve mean gradient measures 3.0 mmHg.  Aortic valve peak gradient measures 6.2 mmHg. Aortic valve area, by VTI measures 2.79 cm. Pulmonic Valve: The pulmonic valve was not well visualized. Pulmonic valve regurgitation is mild. No evidence of pulmonic stenosis. Aorta: The aortic root, ascending aorta, aortic arch and descending aorta are all structurally normal, with no evidence of dilitation or obstruction. Venous: The inferior vena cava is normal in size with greater than 50% respiratory variability, suggesting right atrial pressure of 3 mmHg. IAS/Shunts: The atrial septum is grossly normal.  LEFT VENTRICLE PLAX 2D LVIDd:         3.00 cm   Diastology LVIDs:         2.20 cm   LV e' medial:    3.70 cm/s LV PW:         1.20 cm   LV E/e' medial:  17.9 LV IVS:        1.10 cm   LV e' lateral:   4.68 cm/s LVOT diam:     2.10 cm   LV E/e' lateral: 14.1 LV SV:         47 LV SV Index:   30        2D Longitudinal Strain LVOT Area:     3.46 cm  2D Strain GLS (A4C):   -17.0 %                          2D Strain GLS (A3C):   -15.8 %  2D Strain GLS (A2C):   -17.0 %                          2D Strain GLS Avg:     -16.6 % RIGHT VENTRICLE RV Basal diam:  1.90 cm RV Mid diam:    2.20 cm RV S prime:     9.90 cm/s TAPSE (M-mode): 1.9 cm LEFT ATRIUM             Index        RIGHT ATRIUM          Index LA diam:        2.20 cm 1.42 cm/m   RA Area:     8.42 cm LA Vol (A2C):   37.5 ml 24.13 ml/m  RA Volume:   11.80 ml 7.59 ml/m LA Vol (A4C):   36.7 ml 23.62 ml/m LA Biplane Vol: 39.1 ml 25.16 ml/m  AORTIC VALVE AV Area (Vmax):    2.72 cm AV Area (Vmean):   2.63 cm AV Area (VTI):     2.79 cm AV Vmax:           125.00 cm/s AV Vmean:          88.800 cm/s AV VTI:            0.169 m AV Peak Grad:      6.2 mmHg AV Mean Grad:      3.0 mmHg LVOT Vmax:         98.30 cm/s LVOT Vmean:        67.300 cm/s LVOT VTI:          0.136 m LVOT/AV VTI ratio: 0.80  AORTA Ao Root diam: 3.30 cm Ao Asc diam:  3.10 cm MITRAL VALVE                TRICUSPID VALVE MV Area  (PHT): 5.13 cm     TR Peak grad:   27.0 mmHg MV Decel Time: 148 msec     TR Vmax:        260.00 cm/s MV E velocity: 66.10 cm/s MV A velocity: 118.00 cm/s  SHUNTS MV E/A ratio:  0.56         Systemic VTI:  0.14 m                             Systemic Diam: 2.10 cm Shelda Bruckner MD Electronically signed by Shelda Bruckner MD Signature Date/Time: 09/04/2024/2:18:18 PM    Final            LOS: 2 days   Time spent= 35 mins    Burgess JAYSON Dare, MD [facility]  If 7PM-7AM, please contact night-coverage  09/06/2024, 10:42 AM

## 2024-09-06 NOTE — TOC Initial Note (Signed)
 Transition of Care [facility]) - Initial/Assessment Note   Patient Details  Name: Chelsea [name] MRN: [id number] Date of Birth: [birth year]  [facility] CM/SW Contact:    Duwaine GORMAN Aran, LCSW Phone Number: 09/06/2024, 11:44 AM  Clinical Narrative: CSW notified by hospitalist that daughter is requesting that patient discharge home with hospice through [facility]. CSW made referral to Melissa/Nancy with [facility]. Care management to follow.  Expected Discharge Plan: Home w Hospice Care Barriers to Discharge: Continued Medical Work up  Patient Goals and CMS Choice Patient states their goals for this hospitalization and ongoing recovery are:: Home with hospice CMS Medicare.gov Compare Post Acute Care list provided to:: Patient Represenative (must comment) Choice offered to / list presented to : Adult Children  Expected Discharge Plan and Services In-house Referral: Clinical Social Work, Hospice / Palliative Care Post Acute Care Choice: Hospice Living arrangements for the past 2 months: Apartment            DME Arranged: N/A DME Agency: NA  Prior Living Arrangements/Services Living arrangements for the past 2 months: Apartment Lives with:: Self Patient language and need for interpreter reviewed:: Yes Do you feel safe going back to the place where you live?: Yes      Need for Family Participation in Patient Care: No (Comment) Care giver support system in place?: Yes (comment) Criminal Activity/Legal Involvement Pertinent to Current Situation/Hospitalization: No - Comment as needed  Activities of Daily Living ADL Screening (condition at time of admission) Independently performs ADLs?: No Does the patient have a NEW difficulty with bathing/dressing/toileting/self-feeding that is expected to last >3 days?: No Does the patient have a NEW difficulty with getting in/out of bed, walking, or climbing stairs that is expected to last >3 days?: Yes (Initiates electronic notice to  provider for possible PT consult) Does the patient have a NEW difficulty with communication that is expected to last >3 days?: No Is the patient deaf or have difficulty hearing?: No Does the patient have difficulty seeing, even when wearing glasses/contacts?: No Does the patient have difficulty concentrating, remembering, or making decisions?: No  Emotional Assessment Orientation: : Oriented to Self, Oriented to Place, Oriented to  Time, Oriented to Situation Alcohol / Substance Use: Not Applicable Psych Involvement: No (comment)  Admission diagnosis:  Acute on chronic diastolic congestive heart failure (HCC) [I50.33] Fall [W19.XXXA] Patient Active Problem List   Diagnosis Date Noted   Need for emotional support 09/05/2024   Goals of care, counseling/discussion 09/05/2024   Counseling and coordination of care 09/05/2024   Palliative care encounter 09/05/2024   Acute on chronic diastolic congestive heart failure (HCC) 09/03/2024   Fall 09/03/2024   Edema of right lower extremity 09/03/2024   Hypophosphatemia 06/14/2023   Hypomagnesemia 06/14/2023   Neutropenia 10/20/2022   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   Thrombocytopenia 06/24/2022   Anemia due to antineoplastic chemotherapy 06/17/2022   Encounter for antineoplastic chemotherapy 05/13/2022   Chemotherapy induced neutropenia 03/25/2022   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   Hypokalemia 11/17/2021   Weight loss 11/17/2021   Metastatic colon cancer to liver (HCC) 09/16/2021   OSA (obstructive sleep apnea) 04/02/2021   Aortic atherosclerosis 03/25/2021   Class 1 obesity due to excess calories with body mass index (BMI) of 30.0 to 30.9 in adult 03/16/2021   Mixed incontinence 01/28/2021   Generalized weakness 10/29/2020   Anxiety and depression 01/11/2018   Pure hypercholesterolemia 01/11/2018   Essential hypertension 02/06/2011   Spinal stenosis of lumbosacral region 02/06/2011   PCP:  [name],  [name],  MD Pharmacy:   [facility] - [place], [place] - [address] [place] [address] Phone: [phone] Fax: [phone]  [facility] [phone] [facility] [place] - [address] [place] [phone] Phone: [phone] Fax: [phone]  [id number] - [place], [place] - [address] AT [address] OF [facility] [address] [place] [address] Phone: [phone] Fax: [phone]  DARRYLE LONG - [facility] [address] [place] [address] Phone: [phone] Fax: [phone]  Social Drivers of Health (SDOH) Social History: SDOH Screenings   Food Insecurity: No Food Insecurity (09/03/2024)  Housing: Low Risk  (09/03/2024)  Transportation Needs: Unmet Transportation Needs (09/03/2024)  Utilities: Not At Risk (09/03/2024)  Alcohol Screen: Low Risk  (02/02/2024)  Depression (PHQ2-9): Low Risk  (07/19/2024)  Recent Concern: Depression (PHQ2-9) - High Risk (06/14/2024)  Financial Resource Strain: Low Risk  (02/02/2024)  Physical Activity: Inactive (02/02/2024)  Social Connections: Moderately Integrated (09/03/2024)  Stress: Stress Concern Present (02/02/2024)  Tobacco Use: Low Risk  (09/03/2024)  Health Literacy: Inadequate Health Literacy (02/02/2024)   SDOH Interventions:    Readmission Risk Interventions     No data to display

## 2024-09-06 NOTE — Hospital Course (Addendum)
 Brief Narrative:   78 y.o. 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  She also complained that her RLE has been getting edematous. CT cervical spine no evidence of fracture or acute traumatic injury. DDD and posterior changes. Streaky opacity within the right lung apex favored to represent emphysema. CT abdomen/pelvis with contrast showing significant interval progression of widespread metastatic disease throughout the liver, with the largest conglomerate mass increasing in size from 6.4 x 5.9 cm to 12.9 x 8.1 cm. Interval development of mild to moderate ascites.  Oncology and palliative medicine consulted and on board.   Plan is to transition patient home with hospice  Assessment & Plan:     Metastatic colon cancer with peritoneal involvement Transaminitis Abdominal pain secondary to metastatic disease Unfortunately patient's condition has progressed and declined due to widespread metastatic cancer despite of getting chemotherapy which was stopped a few months ago due to her poor performance status and tolerance.  Seen by oncology, palliative care services and hospice.  Plan is to transition her to home with hospice and provide her with enough support at home    Right lower extremity edema with ascites Due to underlying progression of her metastatic disease.  Echocardiogram essentially shows preserved ejection fraction.  Attempted paracentesis 12/3 but unable to perform due to minimal peritoneal fluid.     Essential hypertension Currently on Maxide daily.  IV as needed   Hypokalemia and  hypophosphatemia Replete as needed     Anemia of chronic disease Anemia from malignancy. Hemoglobin around 9 and stable continue to monitor.   Obstructive sleep apnea Continue CPAP at night.   Estimated body mass index is 30.68 kg/m as calculated from the following:   Height as of this encounter: 4' 9 (1.448 m).   Weight as of this encounter: 64.3 kg.  Unfortunately patient has very poor prognosis.  I suspect life expectancy to be less than 3-6 months.  Transitioning her to hospice.  Ongoing discussions regarding her CODE STATUS   DVT prophylaxis:Lovenox     Code Status: Full Code Family Communication: Daughter at bedside Status is: Inpatient Remains inpatient appropriate because: Plans to transition home once all the hospice arrangements have been made   PT Follow up Recs: Home Health Pt12/10/2023 1700  Subjective: At bedside overall feels quite weak   Examination:  General exam: Appears calm and comfortable, weak frail cachectic Respiratory system: Clear to auscultation. Respiratory effort normal. Cardiovascular system: S1 & S2 heard, RRR. No JVD, murmurs, rubs, gallops or clicks. No pedal edema. Gastrointestinal system: Abdomen is nondistended, soft and nontender. No organomegaly or masses felt. Normal bowel sounds heard. Central nervous system: Alert and oriented. No focal neurological deficits. Extremities: Symmetric 5 x 5 power. Skin: No rashes, lesions or ulcers Psychiatry: Judgement and insight appear normal. Mood & affect appropriate.

## 2024-09-07 ENCOUNTER — Encounter: Payer: Self-pay | Admitting: Hematology

## 2024-09-07 ENCOUNTER — Other Ambulatory Visit (HOSPITAL_COMMUNITY): Payer: Self-pay

## 2024-09-07 DIAGNOSIS — W19XXXD Unspecified fall, subsequent encounter: Secondary | ICD-10-CM | POA: Diagnosis not present

## 2024-09-07 DIAGNOSIS — R531 Weakness: Secondary | ICD-10-CM | POA: Diagnosis not present

## 2024-09-07 MED ORDER — HEPARIN SOD (PORK) LOCK FLUSH 100 UNIT/ML IV SOLN
500.0000 [IU] | INTRAVENOUS | Status: AC | PRN
  Administered 2024-09-07: 500 [IU]
  Filled 2024-09-07: qty 5

## 2024-09-07 MED ORDER — OXYCODONE HCL 5 MG PO TABS
5.0000 mg | ORAL_TABLET | Freq: Four times a day (QID) | ORAL | 0 refills | Status: DC | PRN
  Filled 2024-09-07: qty 30, 8d supply, fill #0

## 2024-09-07 MED ORDER — ALPRAZOLAM 0.25 MG PO TABS
0.2500 mg | ORAL_TABLET | Freq: Three times a day (TID) | ORAL | 0 refills | Status: DC | PRN
  Filled 2024-09-07: qty 30, 10d supply, fill #0

## 2024-09-07 MED ORDER — METHOCARBAMOL 500 MG PO TABS
500.0000 mg | ORAL_TABLET | Freq: Two times a day (BID) | ORAL | 0 refills | Status: DC | PRN
  Filled 2024-09-07: qty 60, 30d supply, fill #0

## 2024-09-07 MED ORDER — GABAPENTIN 300 MG PO CAPS
300.0000 mg | ORAL_CAPSULE | Freq: Every day | ORAL | 0 refills | Status: DC
  Filled 2024-09-07: qty 30, 30d supply, fill #0

## 2024-09-07 MED ORDER — ACETAMINOPHEN 500 MG PO TABS: 500.0000 mg | ORAL_TABLET | Freq: Four times a day (QID) | ORAL | Status: DC

## 2024-09-07 NOTE — Progress Notes (Signed)
 Patient arrived from 34 with transfer unit staff, Sophia, RN. Patient alert & oriented x 4 with no c/o pain or discomfort at this time. Patient oriented to the unit with no concerns at this time. Will continue to monitor the patient.

## 2024-09-07 NOTE — Progress Notes (Signed)
 [id number] [facility] Liaison Note   Spoke with patient's daughter today who requested our services at the patient's home.  Address is confirmed in the chart. Karna, daughter, plans to take patient home via private vehicle at discharge.   DME needs discussed. Karna will request a hospital bed prior to discharge. DME has been ordered and delivery will be scheduled with Karna.    Please call with any questions or concerns.  Thank you for the opportunity to participate in this patient's care.   Inocente Jacobs, BSN, RN [employer] [phone]

## 2024-09-07 NOTE — Progress Notes (Signed)
 PROGRESS NOTE    Chelsea [name]  FMW:[id number] DOB: 06-24-1946 DOA: 09/03/2024 PCP: [name], Tully [name], MD    Brief Narrative:   78 y.o. 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  She also complained that her RLE has been getting edematous. CT cervical spine no evidence of fracture or acute traumatic injury. DDD and posterior changes. Streaky opacity within the right lung apex favored to represent emphysema. CT abdomen/pelvis with contrast showing significant interval progression of widespread metastatic disease throughout the liver, with the largest conglomerate mass increasing in size from 6.4 x 5.9 cm to 12.9 x 8.1 cm. Interval development of mild to moderate ascites.  Oncology and palliative medicine consulted and on board.   Plan is to transition patient home with hospice  Assessment & Plan:     Metastatic colon cancer with peritoneal involvement Transaminitis Abdominal pain secondary to metastatic disease Unfortunately patient's condition has progressed and declined due to widespread metastatic cancer despite of getting chemotherapy which was stopped a few months ago due to her poor performance status and tolerance.  Seen by oncology, palliative care services and hospice.  Plan is to transition her to home with hospice and provide her with enough support at home    Right lower extremity edema with ascites Due to underlying progression of her metastatic disease.  Echocardiogram essentially shows preserved ejection fraction.  Attempted paracentesis 12/3 but unable to perform due  to minimal peritoneal fluid.     Essential hypertension Currently on Maxide daily.  IV as needed   Hypokalemia and hypophosphatemia Replete as needed     Anemia of chronic disease Anemia from malignancy. Hemoglobin around 9 and stable continue to monitor.   Obstructive sleep apnea Continue CPAP at night.   Estimated body mass index is 30.68 kg/m as calculated from the following:   Height as of this encounter: 4' 9 (1.448 m).   Weight as of this encounter: 64.3 kg.  Unfortunately patient has very poor prognosis.  I suspect life expectancy to be less than 3-6 months.  Transitioning her to hospice.  Ongoing discussions regarding her CODE STATUS   DVT prophylaxis:Lovenox     Code Status: Full Code Family Communication: Daughter at bedside Status is: Inpatient Remains inpatient appropriate because: Plans to transition home once all the hospice arrangements have been made   PT Follow up Recs: Home Health Pt12/10/2023 1700  Subjective: At bedside overall feels quite weak   Examination:  General exam: Appears calm and comfortable, weak frail cachectic Respiratory system: Clear to auscultation. Respiratory effort normal. Cardiovascular system: S1 & S2 heard, RRR. No JVD, murmurs, rubs, gallops or clicks. No pedal edema. Gastrointestinal system: Abdomen is nondistended, soft and nontender. No organomegaly or masses felt. Normal bowel sounds heard. Central nervous system: Alert and oriented. No focal neurological deficits. Extremities: Symmetric 5 x 5 power. Skin: No rashes, lesions or ulcers Psychiatry: Judgement and insight appear normal. Mood & affect appropriate.                Diet Orders (From admission, onward)     Start     Ordered   09/07/24 0756  Diet regular Room service appropriate? Yes; Fluid consistency: Thin; Fluid restriction: 1800 mL  Fluid  Diet effective now       Question Answer Comment  Room service appropriate? Yes   Fluid consistency: Thin    Fluid restriction: 1800 mL Fluid      09/07/24 0755            Objective: Vitals:   09/06/24 1425 09/06/24 2102 09/06/24 2355 09/07/24 0422  BP: (!) 142/65 121/69 109/69 (!) 161/79  Pulse: 88 86 85 88  Resp:  20 15 18   Temp: 98.6 F (37 C) 98.2 F (36.8 C) 97.8 F (36.6 C) 98.3 F (36.8 C)  TempSrc: Oral Oral Oral   SpO2: 97% 100% 96% 98%  Weight:      Height:        Intake/Output Summary (Last 24 hours) at 09/07/2024 1050 Last data filed at 09/07/2024 1000 Gross per 24 hour  Intake 400 ml  Output 420 ml  Net -20 ml   Filed Weights   09/03/24 1735  Weight: 64.3 kg    Scheduled Meds:  acetaminophen   650 mg Oral Q6H   busPIRone   5 mg Oral TID   Chlorhexidine Gluconate Cloth  6 each Topical Daily   enoxaparin  (LOVENOX ) injection  40 mg Subcutaneous Daily   FLUoxetine   60 mg Oral q morning   gabapentin   300 mg Oral Daily   triamterene -hydrochlorothiazide   1 tablet Oral Daily   Continuous Infusions:  Nutritional status     Body mass index is 30.68 kg/m.  Data Reviewed:   CBC: Recent Labs  Lab 09/03/24 1141 09/03/24 1149 09/04/24 0324 09/05/24 0404  WBC 9.2  --  7.6 9.4  NEUTROABS 6.9  --   --  7.1  HGB 9.3* 10.2* 9.1* 9.6*  HCT 29.6* 30.0* 29.3* 30.5*  MCV 96.4  --  96.7 97.1  PLT 203  --  219 222   Basic Metabolic Panel: Recent Labs  Lab 09/03/24 1141 09/03/24 1149 09/04/24 0324 09/05/24 0404  NA 138 140 140 139  K 3.4* 3.3* 4.3 3.9  CL 101 98 101 100  CO2 30  --  32 31  GLUCOSE 98 95 92 111*  BUN 12 11 12 12   CREATININE 0.59 0.70 0.63 0.61  CALCIUM  9.0  --  8.9 9.1  MG  --   --  1.9 2.0  PHOS  --   --  2.0* 2.5   GFR: Estimated Creatinine Clearance: 44 mL/min (by C-G formula based on SCr of 0.61 mg/dL). Liver Function Tests: Recent Labs  Lab 09/03/24 1141 09/04/24 0324 09/06/24 0100  AST 174* 178* 163*  ALT 68* 67* 63*  ALKPHOS 685* 684* 593*  BILITOT 1.2 1.1 1.5*  PROT 7.0 7.0 6.9  ALBUMIN 3.0* 2.9* 2.9*   Recent  Labs  Lab 09/03/24 1141  LIPASE 36   No results for input(s): AMMONIA in the last 168 hours. Coagulation Profile: Recent Labs  Lab 09/03/24 1141  INR 1.2   Cardiac Enzymes: Recent Labs  Lab 09/03/24 1141  CKTOTAL 260*   BNP (last 3 results) Recent Labs    09/03/24 1141  PROBNP 596.0*   HbA1C: No results for input(s): HGBA1C in the last 72 hours. CBG: No results for input(s): GLUCAP in the last 168 hours. Lipid Profile: No results for input(s): CHOL, HDL, LDLCALC, TRIG, CHOLHDL, LDLDIRECT in the last 72 hours. Thyroid Function Tests: No results for input(s): TSH, T4TOTAL, FREET4, T3FREE, THYROIDAB in the last 72 hours. Anemia Panel: No results for input(s): VITAMINB12, FOLATE, FERRITIN, TIBC, IRON, RETICCTPCT in the  last 72 hours. Sepsis Labs: Recent Labs  Lab 09/03/24 1149 09/03/24 1448  LATICACIDVEN 1.2 1.2    Recent Results (from the past 240 hours)  Culture, blood (single)     Status: None (Preliminary result)   Collection Time: 09/03/24  2:42 PM   Specimen: BLOOD  Result Value Ref Range Status   Specimen Description   Final    BLOOD PORTA CATH Performed at [facility], [address] [place], [place] [address]    Special Requests   Final    BOTTLES DRAWN AEROBIC AND ANAEROBIC Blood Culture adequate volume Performed at [facility], [address] [place], [place] [address]    Culture   Final    NO GROWTH 4 DAYS Performed at [facility], [address], [place], [place] [address]    Report Status PENDING  Incomplete  Urine Culture     Status: Abnormal   Collection Time: 09/04/24  5:00 AM   Specimen: Urine, Clean Catch  Result Value Ref Range Status   Specimen Description   Final    URINE, CLEAN CATCH Performed at [facility], [address], [place], [place] [address]    Special Requests   Final    NONE Performed at [facility], [address] [place], [place] [address]    Culture MULTIPLE SPECIES PRESENT, SUGGEST RECOLLECTION (A)  Final   Report Status 09/05/2024 FINAL  Final         Radiology Studies: US  ASCITES (ABDOMEN LIMITED) Result Date: 09/06/2024 CLINICAL DATA:  Patient with history of metastatic colon cancer with peritoneal involvement, abdominal pain, ascites. Request for a therapeutic paracentesis. EXAM: LIMITED ABDOMEN ULTRASOUND FOR ASCITES TECHNIQUE: Limited ultrasound survey for ascites was performed in all four abdominal quadrants. COMPARISON:  CT ABDOMEN AND PELVIS ON 09/03/2024 FINDINGS: Limited ultrasound of abdomen in all 4 quadrants revealed only a small amount of free peritoneal fluid. Paracentesis not performed. IMPRESSION: Limited ultrasound of abdomen in all 4 quadrants reveals only a small amount of free peritoneal fluid. Paracentesis not performed. Patient updated. Performed by: Franky Rakers, PA-C Electronically Signed   By: Juliene Balder M.D.   On: 09/06/2024 17:11           LOS: 3 days   Time spent= 35 mins    Burgess JAYSON Dare, MD [facility]  If 7PM-7AM, please contact night-coverage  09/07/2024, 10:50 AM

## 2024-09-07 NOTE — Progress Notes (Signed)
 Medications delivered from [facility] outpatient [facility]. Placed in patient's bag, patient's RN notified.

## 2024-09-07 NOTE — TOC Transition Note (Signed)
 Transition of Care [facility]) - Discharge Note   Patient Details  Name: Chelsea [name] MRN: [id number] Date of Birth: 1946/04/01  Transition of Care [facility]) CM/SW Contact:  NORMAN ASPEN, LCSW Phone Number: 09/07/2024, 4:11 PM   Clinical Narrative:     Pt medically cleared for dc home with [facility] today.  Needed DME has been delivered to home.  Daughter to provide dc transportation.  No further IP CM needs.  Final next level of care: Home w Hospice Care Barriers to Discharge: Barriers Resolved   Patient Goals and CMS Choice Patient states their goals for this hospitalization and ongoing recovery are:: Home with hospice CMS Medicare.gov Compare Post Acute Care list provided to:: Patient Represenative (must comment) Choice offered to / list presented to : Adult Children      Discharge Placement                       Discharge Plan and Services Additional resources added to the After Visit Summary for   In-house Referral: Clinical Social Work, Hospice / Palliative Care   Post Acute Care Choice: Hospice          DME Arranged: N/A DME Agency: NA       HH Arranged: RN HH Agency: [facility] (Physicist, Medical) Date HH Agency Contacted: 09/06/24      Social Drivers of Health (SDOH) Interventions SDOH Screenings   Food Insecurity: No Food Insecurity (09/03/2024)  Housing: Low Risk  (09/03/2024)  Transportation Needs: Unmet Transportation Needs (09/03/2024)  Utilities: Not At Risk (09/03/2024)  Alcohol Screen: Low Risk  (02/02/2024)  Depression (PHQ2-9): Low Risk  (07/19/2024)  Recent Concern: Depression (PHQ2-9) - High Risk (06/14/2024)  Financial Resource Strain: Low Risk  (02/02/2024)  Physical Activity: Inactive (02/02/2024)  Social Connections: Moderately Integrated (09/03/2024)  Stress: Stress Concern Present (02/02/2024)  Tobacco Use: Low Risk  (09/03/2024)  Health Literacy: Inadequate Health Literacy (02/02/2024)      Readmission Risk Interventions    09/07/2024    4:10 PM  Readmission Risk Prevention Plan  Transportation Screening Complete  PCP or Specialist Appt within 5-7 Days Complete  Home Care Screening Complete  Medication Review (RN CM) Complete

## 2024-09-07 NOTE — Discharge Summary (Signed)
 Physician Discharge Summary  STEFFANI DIONISIO FMW:[id number] DOB: [birth year] DOA: 09/03/2024  PCP: [name], Tully [name], MD  Admit date: 09/03/2024 Discharge date: 09/07/2024  Admitted From: Home Disposition:  Home w/ Hospice  Recommendations for Outpatient Follow-up:  Transition home with hospice Cont Code status discussion with hospice team, currently remains full code   Discharge Condition: Stable CODE STATUS: Full Diet recommendation: Regular  Brief/Interim Summary: Brief Narrative:   78 y.o. 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  She also complained that her RLE has been getting edematous. CT cervical spine no evidence of fracture or acute traumatic injury. DDD and posterior changes. Streaky opacity within the right lung apex favored to represent emphysema. CT abdomen/pelvis with contrast showing significant interval progression of widespread metastatic disease throughout the liver, with the largest conglomerate mass increasing in size from 6.4 x 5.9 cm to 12.9 x 8.1 cm. Interval development of mild to moderate ascites.  Oncology and palliative medicine consulted and on board.   Plan is to transition patient home with hospice  Assessment & Plan:     Metastatic colon cancer with peritoneal involvement Transaminitis Abdominal pain secondary to metastatic disease Unfortunately patient's condition has progressed and declined due to widespread metastatic cancer despite of getting chemotherapy which was stopped a few months ago due to her poor performance status and tolerance.   Seen by oncology, palliative care services and hospice.  Plan is to transition her to home with hospice and provide her with enough support at home    Right lower extremity edema with ascites Due to underlying progression of her metastatic disease.  Echocardiogram essentially shows preserved ejection fraction.  Attempted paracentesis 12/3 but unable to perform due to minimal peritoneal fluid.     Essential hypertension Currently on Maxide daily.  IV as needed   Hypokalemia and hypophosphatemia Replete as needed     Anemia of chronic disease Anemia from malignancy. Hemoglobin around 9 and stable continue to monitor.   Obstructive sleep apnea Continue CPAP at night.   Estimated body mass index is 30.68 kg/m as calculated from the following:   Height as of this encounter: 4' 9 (1.448 m).   Weight as of this encounter: 64.3 kg.  Unfortunately patient has very poor prognosis.  I suspect life expectancy to be less than 3-6 months.  Transitioning her to hospice.  Ongoing discussions regarding her CODE STATUS   DVT prophylaxis:Lovenox     Code Status: Full Code Family Communication: Daughter at bedside Status is: Inpatient Remains inpatient appropriate because: Plans to transition home once all the hospice arrangements have been made   PT Follow up Recs: Home Health Pt12/10/2023 1700  Subjective: At bedside overall feels quite weak   Examination:  General exam: Appears calm and comfortable, weak frail cachectic Respiratory system: Clear to auscultation. Respiratory effort normal. Cardiovascular system: S1 & S2 heard, RRR. No JVD, murmurs, rubs, gallops or clicks. No pedal edema. Gastrointestinal system: Abdomen is nondistended, soft and nontender. No organomegaly or masses felt. Normal bowel sounds heard. Central nervous system: Alert and oriented. No focal neurological deficits. Extremities: Symmetric 5 x 5 power. Skin: No rashes, lesions or ulcers Psychiatry: Judgement and  insight appear normal. Mood & affect appropriate.  Discharge Diagnoses:  Principal Problem:   Fall Active Problems:   Metastatic colon cancer to liver [facility])   Essential hypertension   Anxiety and depression   Pure hypercholesterolemia   Generalized weakness   Aortic atherosclerosis   OSA (obstructive sleep apnea)   Hypokalemia   Edema of right lower extremity   Need for emotional support   Goals of care, counseling/discussion   Counseling and coordination of care   Palliative care encounter      Discharge Exam: Vitals:   09/07/24 0422 09/07/24 1343  BP: (!) 161/79 138/79  Pulse: 88 97  Resp: 18 15  Temp: 98.3 F (36.8 C) 98.6 F (37 C)  SpO2: 98% 91%   Vitals:   09/06/24 2102 09/06/24 2355 09/07/24 0422 09/07/24 1343  BP: 121/69 109/69 (!) 161/79 138/79  Pulse: 86 85 88 97  Resp: 20 15 18 15   Temp: 98.2 F (36.8 C) 97.8 F (36.6 C) 98.3 F (36.8 C) 98.6 F (37 C)  TempSrc: Oral Oral  Oral  SpO2: 100% 96% 98% 91%  Weight:      Height:          Discharge Instructions   Allergies as of 09/07/2024       Reactions   Penicillins    Everything turned black        Medication List     STOP taking these medications    atorvastatin  10 MG tablet Commonly known as: LIPITOR       TAKE these medications    acetaminophen  500 MG tablet Commonly known as: TYLENOL  Take 1 tablet (500 mg total) by mouth 4 (four) times daily. What changed:  when to take this reasons to take this   ALPRAZolam  0.25 MG tablet Commonly known as: XANAX  Take 1 tablet (0.25 mg total) by mouth 3 (three) times daily as needed for anxiety or sleep. What changed:  medication strength how much to take how to take this when to take this reasons to take this additional instructions   aspirin  EC 81 MG tablet Take 1 tablet (81 mg total) by mouth daily. Swallow whole.   busPIRone  5 MG tablet Commonly known as: BUSPAR  TAKE 1 TABLET BY MOUTH THREE TIMES A DAY    diclofenac  Sodium 1 % Gel Commonly known as: VOLTAREN  Apply 4 g topically 4 (four) times daily. What changed:  when to take this reasons to take this   FLUoxetine  20 MG capsule Commonly known as: PROZAC  TAKE 3 CAPSULES (60 MG TOTAL) BY MOUTH EVERY MORNING.   gabapentin  300 MG capsule Commonly known as: NEURONTIN  Take 1 capsule (300 mg total) by mouth daily. Start taking on: September 08, 2024 What changed: See the new instructions.   lidocaine -prilocaine  cream Commonly known as: EMLA  Apply small amount of cream to port site 1-2 hour prior to chemo treatment.   loratadine  10 MG tablet Commonly known as: CLARITIN  Take 1 tablet (10 mg total) by mouth See admin instructions. Take 1 tablet daily for 4 days after each chemotherapy treatments. What changed:  when to take this reasons to take this additional instructions   methocarbamol  500 MG tablet Commonly known as: ROBAXIN  Take 1 tablet (500 mg total) by mouth 2 (two) times daily as needed for muscle spasms.   oxyCODONE  5 MG immediate release tablet Commonly known as: Oxy IR/ROXICODONE  Take 1 tablet (5 mg total) by mouth every 6 (six) hours as needed for moderate pain (pain score 4-6) or severe pain (pain score 7-10).   polyethylene  glycol powder 17 GM/SCOOP powder Commonly known as: GLYCOLAX /MIRALAX  Take 17 g by mouth daily. What changed:  when to take this reasons to take this   potassium chloride  SA 20 MEQ tablet Commonly known as: KLOR-CON  M TAKE 1 TABLET EVERY DAY   triamterene -hydrochlorothiazide  37.5-25 MG tablet Commonly known as: MAXZIDE -25 TAKE 1 TABLET EVERY DAY   VITAMIN B 12 PO Take 500 mcg by mouth daily.               Durable Medical Equipment  (From admission, onward)           Start     Ordered   09/06/24 1707  For home use only DME Hospital bed  Once       Question Answer Comment  Length of Need Lifetime   Patient has (list medical condition): Metastatic colon cancer now hospice  patient.   The above medical condition requires: Patient requires the ability to reposition immediately   Bed type Semi-electric      09/06/24 1706            Follow-up Information     [name], [name], MD Follow up in 1 week(s).   Specialty: Internal Medicine Contact information: [address] [place] [address] [phone]                Allergies  Allergen Reactions   Penicillins     Everything turned black    You were cared for by a hospitalist during your hospital stay. If you have any questions about your discharge medications or the care you received while you were in the hospital after you are discharged, you can call the unit and asked to speak with the hospitalist on call if the hospitalist that took care of you is not available. Once you are discharged, your primary care physician will handle any further medical issues. Please note that no refills for any discharge medications will be authorized once you are discharged, as it is imperative that you return to your primary care physician (or establish a relationship with a primary care physician if you do not have one) for your aftercare needs so that they can reassess your need for medications and monitor your lab values.  You were cared for by a hospitalist during your hospital stay. If you have any questions about your discharge medications or the care you received while you were in the hospital after you are discharged, you can call the unit and asked to speak with the hospitalist on call if the hospitalist that took care of you is not available. Once you are discharged, your primary care physician will handle any further medical issues. Please note that NO REFILLS for any discharge medications will be authorized once you are discharged, as it is imperative that you return to your primary care physician (or establish a relationship with a primary care physician if you do not have one) for your  aftercare needs so that they can reassess your need for medications and monitor your lab values.  Please request your Prim.MD to go over all Hospital Tests and Procedure/Radiological results at the follow up, please get all Hospital records sent to your Prim MD by signing hospital release before you go home.  Get CBC, CMP, 2 view Chest X ray checked  by Primary MD during your next visit or SNF MD in 5-7 days ( we routinely change or add medications that can affect your baseline labs and fluid status, therefore we  recommend that you get the mentioned basic workup next visit with your PCP, your PCP may decide not to get them or add new tests based on their clinical decision)  On your next visit with your primary care physician please Get Medicines reviewed and adjusted.  If you experience worsening of your admission symptoms, develop shortness of breath, life threatening emergency, suicidal or homicidal thoughts you must seek medical attention immediately by calling 911 or calling your MD immediately  if symptoms less severe.  You Must read complete instructions/literature along with all the possible adverse reactions/side effects for all the Medicines you take and that have been prescribed to you. Take any new Medicines after you have completely understood and accpet all the possible adverse reactions/side effects.   Do not drive, operate heavy machinery, perform activities at heights, swimming or participation in water activities or provide baby sitting services if your were admitted for syncope or siezures until you have seen by Primary MD or a Neurologist and advised to do so again.  Do not drive when taking Pain medications.   Procedures/Studies: US  ASCITES (ABDOMEN LIMITED) Result Date: 09/06/2024 CLINICAL DATA:  Patient with history of metastatic colon cancer with peritoneal involvement, abdominal pain, ascites. Request for a therapeutic paracentesis. EXAM: LIMITED ABDOMEN ULTRASOUND FOR  ASCITES TECHNIQUE: Limited ultrasound survey for ascites was performed in all four abdominal quadrants. COMPARISON:  CT ABDOMEN AND PELVIS ON 09/03/2024 FINDINGS: Limited ultrasound of abdomen in all 4 quadrants revealed only a small amount of free peritoneal fluid. Paracentesis not performed. IMPRESSION: Limited ultrasound of abdomen in all 4 quadrants reveals only a small amount of free peritoneal fluid. Paracentesis not performed. Patient updated. Performed by: Franky Rakers, PA-C Electronically Signed   By: Juliene Balder M.D.   On: 09/06/2024 17:11   ECHOCARDIOGRAM COMPLETE Result Date: 09/04/2024    ECHOCARDIOGRAM REPORT   Patient Name:   MYRENE BOUGHER Date of Exam: 09/04/2024 Medical Rec #:  [id number]          Height:       57.0 in Accession #:    [id number]         Weight:       141.8 lb Date of Birth:  05/03/1946          BSA:          1.554 m Patient Age:    6 years           BP:           142/72 mmHg Patient Gender: F                  HR:           99 bpm. Exam Location:  Inpatient Procedure: 2D Echo and Strain Analysis (Both Spectral and Color Flow Doppler            were utilized during procedure). Indications:    Leg edema  History:        Patient has prior history of Echocardiogram examinations. CHF;                 Risk Factors:Hypertension.  Sonographer:    Charmaine Gaskins Referring Phys: [id number] VIJAYA AKULA IMPRESSIONS  1. Left ventricular ejection fraction, by estimation, is 60 to 65%. The left ventricle has normal function. The left ventricle has no regional wall motion abnormalities. There is mild left ventricular hypertrophy. Left ventricular diastolic parameters are consistent with Grade I diastolic dysfunction (impaired relaxation). The average left  ventricular global longitudinal strain is -16.6 %. The global longitudinal strain is normal.  2. Right ventricular systolic function is normal. The right ventricular size is normal. There is normal pulmonary artery systolic pressure.  3. The  mitral valve is normal in structure. Trivial mitral valve regurgitation. No evidence of mitral stenosis.  4. The aortic valve is tricuspid. There is mild calcification of the aortic valve. There is mild thickening of the aortic valve. Aortic valve regurgitation is not visualized. Aortic valve sclerosis is present, with no evidence of aortic valve stenosis.  5. The inferior vena cava is normal in size with greater than 50% respiratory variability, suggesting right atrial pressure of 3 mmHg. Comparison(s): No significant change from prior study. FINDINGS  Left Ventricle: Left ventricular ejection fraction, by estimation, is 60 to 65%. The left ventricle has normal function. The left ventricle has no regional wall motion abnormalities. The average left ventricular global longitudinal strain is -16.6 %. Strain was performed and the global longitudinal strain is normal. The left ventricular internal cavity size was normal in size. There is mild left ventricular hypertrophy. Left ventricular diastolic parameters are consistent with Grade I diastolic dysfunction (impaired relaxation). Right Ventricle: The right ventricular size is normal. No increase in right ventricular wall thickness. Right ventricular systolic function is normal. There is normal pulmonary artery systolic pressure. The tricuspid regurgitant velocity is 2.60 m/s, and  with an assumed right atrial pressure of 3 mmHg, the estimated right ventricular systolic pressure is 30.0 mmHg. Left Atrium: Left atrial size was normal in size. Right Atrium: Right atrial size was normal in size. Pericardium: There is no evidence of pericardial effusion. Mitral Valve: The mitral valve is normal in structure. Trivial mitral valve regurgitation. No evidence of mitral valve stenosis. Tricuspid Valve: The tricuspid valve is normal in structure. Tricuspid valve regurgitation is trivial. No evidence of tricuspid stenosis. Aortic Valve: The aortic valve is tricuspid. There is  mild calcification of the aortic valve. There is mild thickening of the aortic valve. There is moderate aortic valve annular calcification. Aortic valve regurgitation is not visualized. Aortic valve sclerosis is present, with no evidence of aortic valve stenosis. Aortic valve mean gradient measures 3.0 mmHg. Aortic valve peak gradient measures 6.2 mmHg. Aortic valve area, by VTI measures 2.79 cm. Pulmonic Valve: The pulmonic valve was not well visualized. Pulmonic valve regurgitation is mild. No evidence of pulmonic stenosis. Aorta: The aortic root, ascending aorta, aortic arch and descending aorta are all structurally normal, with no evidence of dilitation or obstruction. Venous: The inferior vena cava is normal in size with greater than 50% respiratory variability, suggesting right atrial pressure of 3 mmHg. IAS/Shunts: The atrial septum is grossly normal.  LEFT VENTRICLE PLAX 2D LVIDd:         3.00 cm   Diastology LVIDs:         2.20 cm   LV e' medial:    3.70 cm/s LV PW:         1.20 cm   LV E/e' medial:  17.9 LV IVS:        1.10 cm   LV e' lateral:   4.68 cm/s LVOT diam:     2.10 cm   LV E/e' lateral: 14.1 LV SV:         47 LV SV Index:   30        2D Longitudinal Strain LVOT Area:     3.46 cm  2D Strain GLS (A4C):   -17.0 %  2D Strain GLS (A3C):   -15.8 %                          2D Strain GLS (A2C):   -17.0 %                          2D Strain GLS Avg:     -16.6 % RIGHT VENTRICLE RV Basal diam:  1.90 cm RV Mid diam:    2.20 cm RV S prime:     9.90 cm/s TAPSE (M-mode): 1.9 cm LEFT ATRIUM             Index        RIGHT ATRIUM          Index LA diam:        2.20 cm 1.42 cm/m   RA Area:     8.42 cm LA Vol (A2C):   37.5 ml 24.13 ml/m  RA Volume:   11.80 ml 7.59 ml/m LA Vol (A4C):   36.7 ml 23.62 ml/m LA Biplane Vol: 39.1 ml 25.16 ml/m  AORTIC VALVE AV Area (Vmax):    2.72 cm AV Area (Vmean):   2.63 cm AV Area (VTI):     2.79 cm AV Vmax:           125.00 cm/s AV Vmean:           88.800 cm/s AV VTI:            0.169 m AV Peak Grad:      6.2 mmHg AV Mean Grad:      3.0 mmHg LVOT Vmax:         98.30 cm/s LVOT Vmean:        67.300 cm/s LVOT VTI:          0.136 m LVOT/AV VTI ratio: 0.80  AORTA Ao Root diam: 3.30 cm Ao Asc diam:  3.10 cm MITRAL VALVE                TRICUSPID VALVE MV Area (PHT): 5.13 cm     TR Peak grad:   27.0 mmHg MV Decel Time: 148 msec     TR Vmax:        260.00 cm/s MV E velocity: 66.10 cm/s MV A velocity: 118.00 cm/s  SHUNTS MV E/A ratio:  0.56         Systemic VTI:  0.14 m                             Systemic Diam: 2.10 cm Shelda Bruckner MD Electronically signed by Shelda Bruckner MD Signature Date/Time: 09/04/2024/2:18:18 PM    Final    VAS US  LOWER EXTREMITY VENOUS (DVT) (7a-7p) Result Date: 09/04/2024  Lower Venous DVT Study Patient Name:  CHIVON LEPAGE  Date of Exam:   09/03/2024 Medical Rec #: [id number]           Accession #:    [id number] Date of Birth: [birth year]           Patient Gender: F Patient Age:   32 years Exam Location:  [facility] Procedure:      VAS US  LOWER EXTREMITY VENOUS (DVT) Referring Phys: RILEY RANSOM --------------------------------------------------------------------------------  Indications: Edema.  Risk Factors: None identified. Comparison Study: No prior studies. Performing Technologist: Cordella Collet RVT  Examination Guidelines: A complete evaluation includes B-mode imaging, spectral Doppler, color  Doppler, and power Doppler as needed of all accessible portions of each vessel. Bilateral testing is considered an integral part of a complete examination. Limited examinations for reoccurring indications may be performed as noted. The reflux portion of the exam is performed with the patient in reverse Trendelenburg.  +---------+---------------+---------+-----------+----------+--------------+ RIGHT    CompressibilityPhasicitySpontaneityPropertiesThrombus Aging  +---------+---------------+---------+-----------+----------+--------------+ CFV      Full           Yes      Yes                                 +---------+---------------+---------+-----------+----------+--------------+ SFJ      Full                                                        +---------+---------------+---------+-----------+----------+--------------+ FV Prox  Full                                                        +---------+---------------+---------+-----------+----------+--------------+ FV Mid   Full                                                        +---------+---------------+---------+-----------+----------+--------------+ FV DistalFull                                                        +---------+---------------+---------+-----------+----------+--------------+ PFV      Full                                                        +---------+---------------+---------+-----------+----------+--------------+ POP      Full           Yes      Yes                                 +---------+---------------+---------+-----------+----------+--------------+ PTV      Full                                                        +---------+---------------+---------+-----------+----------+--------------+ PERO     Full                                                        +---------+---------------+---------+-----------+----------+--------------+   +----+---------------+---------+-----------+----------+--------------+  LEFTCompressibilityPhasicitySpontaneityPropertiesThrombus Aging +----+---------------+---------+-----------+----------+--------------+ CFV Full           Yes      Yes                                 +----+---------------+---------+-----------+----------+--------------+     Summary: RIGHT: - There is no evidence of deep vein thrombosis in the lower extremity.  - No cystic structure found in the popliteal fossa.   LEFT: - No evidence of common femoral vein obstruction.   *See table(s) above for measurements and observations. Electronically signed by Debby Robertson on 09/04/2024 at 9:17:21 AM.    Final    CT ABDOMEN PELVIS W CONTRAST Result Date: 09/03/2024 EXAM: CT ABDOMEN AND PELVIS WITH CONTRAST 09/03/2024 12:40:37 PM TECHNIQUE: CT of the abdomen and pelvis was performed with the administration of 100 mL of iohexol  (OMNIPAQUE ) 300 MG/ML solution. Multiplanar reformatted images are provided for review. Automated exposure control, iterative reconstruction, and/or weight-based adjustment of the mA/kV was utilized to reduce the radiation dose to as low as reasonably achievable. COMPARISON: CT of the chest, abdomen and pelvis dated 04/19/2024. CLINICAL HISTORY: Abdominal pain, acute, nonlocalized; diffuse abdominal pain, worsening abdominal distention. FINDINGS: LOWER CHEST: There are numerous pulmonary nodules within the lung bases, which have significantly increased in size and number in the interim. There are approximately 40 nodules in the visualized lungs. LIVER: There has been significant interval progression of widespread metastatic disease throughout the liver. The largest conglomerate mass has increased in size from approximately 6.4 x 5.9 cm to 12.9 x 8.1 cm. GALLBLADDER AND BILE DUCTS: Gallbladder is unremarkable. No biliary ductal dilatation. SPLEEN: No acute abnormality. PANCREAS: No acute abnormality. ADRENAL GLANDS: No acute abnormality. KIDNEYS, URETERS AND BLADDER: No stones in the kidneys or ureters. No hydronephrosis. No perinephric or periureteral stranding. Urinary bladder is unremarkable. GI AND BOWEL: Stomach demonstrates no acute abnormality. There is no bowel obstruction. PERITONEUM AND RETROPERITONEUM: There has been interval development of mild-to-moderate ascites. No free air. VASCULATURE: Aorta is normal in caliber. LYMPH NODES: No lymphadenopathy. REPRODUCTIVE ORGANS: A cystic right adnexal mass  has also significantly increased in size in the interim from 8.9 x 6.5 cm to approximately 12.7 x 10.5 cm. BONES AND SOFT TISSUES: There are extensive degenerative changes present throughout the thoracolumbar spine. There are no osseous lesions present. No focal soft tissue abnormality. IMPRESSION: 1. Significant interval progression of widespread metastatic disease throughout the liver, with the largest conglomerate mass increasing in size from approximately 6.4 x 5.9 cm to 12.9 x 8.1 cm. 2. Significant interval progression of numerous pulmonary nodules within the lung bases, with approximately 40 nodules in the visualized lungs. 3. Significant interval progression of a cystic right adnexal mass, increasing in size from 8.9 x 6.5 cm to approximately 12.7 x 10.5 cm. 4. Interval development of mild-to-moderate ascites. Electronically signed by: Evalene Coho MD 09/03/2024 12:59 PM EST RP Workstation: [id number]   CT Head Wo Contrast Result Date: 09/03/2024 EXAM: CT HEAD WITHOUT CONTRAST 09/03/2024 12:40:37 PM TECHNIQUE: CT of the head was performed without the administration of intravenous contrast. Automated exposure control, iterative reconstruction, and/or weight based adjustment of the mA/kV was utilized to reduce the radiation dose to as low as reasonably achievable. COMPARISON: CT of the head dated 02/12/2021. CLINICAL HISTORY: Head trauma, minor (Age >= 65y); Headache, new onset (Age >= 51y); increasing headache with known metastatic cancer, recent call with trauma to back fo the head. FINDINGS:  BRAIN AND VENTRICLES: No acute hemorrhage. No evidence of acute infarct. No hydrocephalus. No extra-axial collection. No mass effect or midline shift. There is age-related atrophy and mild-to-moderate periventricular and deep cerebral white matter disease. There is moderate calcification of the carotid siphons. ORBITS: No acute abnormality. The patient is status post right cataract surgery. SINUSES: No acute  abnormality. SOFT TISSUES AND SKULL: No acute soft tissue abnormality. No skull fracture. IMPRESSION: 1. No acute intracranial abnormality. 2. Age-related atrophy and mild-to-moderate periventricular and deep cerebral white matter disease. 3. Moderate calcification of the carotid siphons. Electronically signed by: Evalene Coho MD 09/03/2024 12:53 PM EST RP Workstation: [id number]   CT Cervical Spine Wo Contrast Result Date: 09/03/2024 EXAM: CT CERVICAL SPINE WITHOUT CONTRAST 09/03/2024 12:40:37 PM TECHNIQUE: CT of the cervical spine was performed without the administration of intravenous contrast. Multiplanar reformatted images are provided for review. Automated exposure control, iterative reconstruction, and/or weight based adjustment of the mA/kV was utilized to reduce the radiation dose to as low as reasonably achievable. COMPARISON: CT of the cervical spine dated 11/07/2020. CLINICAL HISTORY: Neck trauma (Age >= 65y). FINDINGS: CERVICAL SPINE: BONES AND ALIGNMENT: No evidence of fracture or acute traumatic injury. The patient is again noted to be status post bilateral decompression laminectomies and bilateral posterolateral spinal fusion of C3 through C6. The orthopedic hardware is intact and unremarkable. 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 There are also anterior osteophytes present. SOFT TISSUES: No prevertebral soft tissue swelling. A left internal jugular chest port is present. LUNGS: There is a streaky opacity within the right lung apex. IMPRESSION: 1. No evidence of fracture or acute traumatic injury. 2. Status post bilateral decompression laminectomies and bilateral posterolateral spinal fusion of C3 through C6 with intact and unremarkable orthopedic hardware. 3. 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 4. Streaky opacity within the right lung apex; if this  is favored to represent emphysema and patient is between 79 years old, consider evaluation for a low-dose CT lung cancer screening program. Electronically signed by: Evalene Coho MD 09/03/2024 12:46 PM EST RP Workstation: [id number]     The results of significant diagnostics from this hospitalization (including imaging, microbiology, ancillary and laboratory) are listed below for reference.     Microbiology: Recent Results (from the past 240 hours)  Culture, blood (single)     Status: None (Preliminary result)   Collection Time: 09/03/24  2:42 PM   Specimen: BLOOD  Result Value Ref Range Status   Specimen Description   Final    BLOOD PORTA CATH Performed at [facility], [address] [place], [place] [address]    Special Requests   Final    BOTTLES DRAWN AEROBIC AND ANAEROBIC Blood Culture adequate volume Performed at [facility], [address], [place], [place] [address]    Culture   Final    NO GROWTH 4 DAYS Performed at [facility], [address], [place], [place] [address]    Report Status PENDING  Incomplete  Urine Culture     Status: Abnormal   Collection Time: 09/04/24  5:00 AM   Specimen: Urine, Clean Catch  Result Value Ref Range Status   Specimen Description   Final    URINE, CLEAN CATCH Performed at [facility], [address] [place], [place] [address]    Special Requests   Final    NONE Performed  at [facility], [address] [place], [place] [address]    Culture MULTIPLE SPECIES PRESENT, SUGGEST RECOLLECTION (A)  Final   Report Status 09/05/2024 FINAL  Final     Labs: BNP (last 3 results) No results for input(s): BNP in the last 8760 hours. Basic Metabolic Panel: Recent Labs  Lab 09/03/24 1141 09/03/24 1149 09/04/24 0324 09/05/24 0404  NA 138 140 140 139  K 3.4* 3.3* 4.3 3.9  CL 101 98 101 100  CO2 30  --  32 31  GLUCOSE 98 95 92 111*  BUN 12 11  12 12   CREATININE 0.59 0.70 0.63 0.61  CALCIUM  9.0  --  8.9 9.1  MG  --   --  1.9 2.0  PHOS  --   --  2.0* 2.5   Liver Function Tests: Recent Labs  Lab 09/03/24 1141 09/04/24 0324 09/06/24 0100  AST 174* 178* 163*  ALT 68* 67* 63*  ALKPHOS 685* 684* 593*  BILITOT 1.2 1.1 1.5*  PROT 7.0 7.0 6.9  ALBUMIN 3.0* 2.9* 2.9*   Recent Labs  Lab 09/03/24 1141  LIPASE 36   No results for input(s): AMMONIA in the last 168 hours. CBC: Recent Labs  Lab 09/03/24 1141 09/03/24 1149 09/04/24 0324 09/05/24 0404  WBC 9.2  --  7.6 9.4  NEUTROABS 6.9  --   --  7.1  HGB 9.3* 10.2* 9.1* 9.6*  HCT 29.6* 30.0* 29.3* 30.5*  MCV 96.4  --  96.7 97.1  PLT 203  --  219 222   Cardiac Enzymes: Recent Labs  Lab 09/03/24 1141  CKTOTAL 260*   BNP: Invalid input(s): POCBNP CBG: No results for input(s): GLUCAP in the last 168 hours. D-Dimer No results for input(s): DDIMER in the last 72 hours. Hgb A1c No results for input(s): HGBA1C in the last 72 hours. Lipid Profile No results for input(s): CHOL, HDL, LDLCALC, TRIG, CHOLHDL, LDLDIRECT in the last 72 hours. Thyroid function studies No results for input(s): TSH, T4TOTAL, T3FREE, THYROIDAB in the last 72 hours.  Invalid input(s): FREET3 Anemia work up No results for input(s): VITAMINB12, FOLATE, FERRITIN, TIBC, IRON, RETICCTPCT in the last 72 hours. Urinalysis    Component Value Date/Time   COLORURINE YELLOW 09/04/2024 0500   APPEARANCEUR CLEAR 09/04/2024 0500   APPEARANCEUR Cloudy (A) 08/26/2021 1157   LABSPEC 1.020 09/04/2024 0500   PHURINE 7.0 09/04/2024 0500   GLUCOSEU NEGATIVE 09/04/2024 0500   GLUCOSEU NEGATIVE 07/27/2024 1424   GLUCOSEU NEGATIVE 07/27/2024 1424   HGBUR NEGATIVE 09/04/2024 0500   BILIRUBINUR NEGATIVE 09/04/2024 0500   BILIRUBINUR Negative 08/26/2021 1157   KETONESUR NEGATIVE 09/04/2024 0500   PROTEINUR NEGATIVE 09/04/2024 0500   UROBILINOGEN 1.0 07/27/2024 1424    UROBILINOGEN 1.0 07/27/2024 1424   NITRITE NEGATIVE 09/04/2024 0500   LEUKOCYTESUR NEGATIVE 09/04/2024 0500   Sepsis Labs Recent Labs  Lab 09/03/24 1141 09/04/24 0324 09/05/24 0404  WBC 9.2 7.6 9.4   Microbiology Recent Results (from the past 240 hours)  Culture, blood (single)     Status: None (Preliminary result)   Collection Time: 09/03/24  2:42 PM   Specimen: BLOOD  Result Value Ref Range Status   Specimen Description   Final    BLOOD PORTA CATH Performed at [facility], [address], [place], [place] [address]    Special Requests   Final    BOTTLES DRAWN AEROBIC AND ANAEROBIC Blood Culture adequate volume Performed at [facility], [address]  [place], [place] [address]    Culture   Final    NO GROWTH 4 DAYS Performed at [facility], [address], [place], [place] [address]    Report Status PENDING  Incomplete  Urine Culture     Status: Abnormal   Collection Time: 09/04/24  5:00 AM   Specimen: Urine, Clean Catch  Result Value Ref Range Status   Specimen Description   Final    URINE, CLEAN CATCH Performed at [facility], [address], [place], [place] [address]    Special Requests   Final    NONE Performed at [facility], [address] [place], [place] [address]    Culture MULTIPLE SPECIES PRESENT, SUGGEST RECOLLECTION (A)  Final   Report Status 09/05/2024 FINAL  Final     Time coordinating discharge:  I have spent 35 minutes face to face with the patient and on the ward discussing the patients care, assessment, plan and disposition with other care givers. >50% of the time was devoted counseling the patient about the risks and benefits of treatment/Discharge disposition and coordinating care.   SIGNED:   Burgess JAYSON Dare, MD  [facility] 09/07/2024, 6:10 PM   If 7PM-7AM, please contact night-coverage

## 2024-09-08 LAB — CULTURE, BLOOD (SINGLE)
Culture: NO GROWTH
Special Requests: ADEQUATE

## 2024-09-18 ENCOUNTER — Inpatient Hospital Stay: Payer: Medicare (Managed Care) | Attending: Nurse Practitioner | Admitting: Hematology

## 2024-09-18 ENCOUNTER — Inpatient Hospital Stay: Payer: Medicare (Managed Care)

## 2024-09-19 ENCOUNTER — Ambulatory Visit: Payer: Medicare (Managed Care) | Admitting: Neurology

## 2024-09-19 ENCOUNTER — Encounter: Payer: Self-pay | Admitting: Neurology

## 2024-10-05 DEATH — deceased
